# Patient Record
Sex: Male | Born: 1935
Health system: Southern US, Community
[De-identification: ages and names within clinical notes are randomized; demographics above are authoritative.]

## PROBLEM LIST (undated history)

## (undated) DIAGNOSIS — I10 Essential (primary) hypertension: Secondary | ICD-10-CM

## (undated) DIAGNOSIS — G4733 Obstructive sleep apnea (adult) (pediatric): Secondary | ICD-10-CM

## (undated) DIAGNOSIS — Z951 Presence of aortocoronary bypass graft: Secondary | ICD-10-CM

## (undated) DIAGNOSIS — Z952 Presence of prosthetic heart valve: Secondary | ICD-10-CM

## (undated) DIAGNOSIS — E119 Type 2 diabetes mellitus without complications: Secondary | ICD-10-CM

## (undated) DIAGNOSIS — I739 Peripheral vascular disease, unspecified: Secondary | ICD-10-CM

## (undated) DIAGNOSIS — R0600 Dyspnea, unspecified: Secondary | ICD-10-CM

## (undated) DIAGNOSIS — D696 Thrombocytopenia, unspecified: Secondary | ICD-10-CM

## (undated) DIAGNOSIS — T82858A Stenosis of vascular prosthetic devices, implants and grafts, initial encounter: Secondary | ICD-10-CM

## (undated) DIAGNOSIS — I00 Rheumatic fever without heart involvement: Secondary | ICD-10-CM

## (undated) DIAGNOSIS — I5032 Chronic diastolic (congestive) heart failure: Secondary | ICD-10-CM

## (undated) DIAGNOSIS — I482 Chronic atrial fibrillation, unspecified: Secondary | ICD-10-CM

## (undated) DIAGNOSIS — R040 Epistaxis: Secondary | ICD-10-CM

## (undated) DIAGNOSIS — E538 Deficiency of other specified B group vitamins: Secondary | ICD-10-CM

## (undated) DIAGNOSIS — R011 Cardiac murmur, unspecified: Secondary | ICD-10-CM

## (undated) HISTORY — PX: OTHER SURGICAL HISTORY: SHX169

## (undated) HISTORY — DX: Chronic atrial fibrillation, unspecified: I48.20

## (undated) HISTORY — DX: Essential (primary) hypertension: I10

## (undated) HISTORY — PX: TONSILLECTOMY: SUR1361

## (undated) HISTORY — DX: Presence of aortocoronary bypass graft: Z95.1

## (undated) HISTORY — DX: Rheumatic fever without heart involvement: I00

## (undated) HISTORY — DX: Peripheral vascular disease, unspecified: I73.9

## (undated) HISTORY — DX: Obstructive sleep apnea (adult) (pediatric): G47.33

## (undated) HISTORY — DX: Deficiency of other specified B group vitamins: E53.8

## (undated) HISTORY — DX: Stenosis of other vascular prosthetic devices, implants and grafts, initial encounter: T82.858A

---

## 1992-04-03 DIAGNOSIS — I779 Disorder of arteries and arterioles, unspecified: Secondary | ICD-10-CM

## 1992-04-03 HISTORY — DX: Disorder of arteries and arterioles, unspecified: I77.9

## 1992-04-03 HISTORY — PX: CARDIAC VALVE SURGERY: SHX40

## 1992-04-03 HISTORY — PX: CORONARY ARTERY BYPASS GRAFT: SHX141

## 1995-04-04 HISTORY — PX: CAROTID ENDARTERECTOMY: SUR193

## 2004-03-11 ENCOUNTER — Encounter: Admission: RE | Admit: 2004-03-11 | Discharge: 2004-03-11 | Payer: Self-pay | Admitting: Gastroenterology

## 2005-01-11 ENCOUNTER — Ambulatory Visit (HOSPITAL_COMMUNITY): Admission: RE | Admit: 2005-01-11 | Discharge: 2005-01-11 | Payer: Self-pay | Admitting: Interventional Cardiology

## 2006-04-20 ENCOUNTER — Encounter: Admission: RE | Admit: 2006-04-20 | Discharge: 2006-04-20 | Payer: Self-pay | Admitting: Family Medicine

## 2006-05-07 ENCOUNTER — Ambulatory Visit: Payer: Self-pay | Admitting: Vascular Surgery

## 2006-07-06 ENCOUNTER — Ambulatory Visit (HOSPITAL_COMMUNITY): Admission: RE | Admit: 2006-07-06 | Discharge: 2006-07-06 | Payer: Self-pay | Admitting: Interventional Cardiology

## 2007-11-29 ENCOUNTER — Encounter: Admission: RE | Admit: 2007-11-29 | Discharge: 2007-11-29 | Payer: Self-pay | Admitting: Interventional Cardiology

## 2008-12-30 ENCOUNTER — Inpatient Hospital Stay (HOSPITAL_COMMUNITY): Admission: EM | Admit: 2008-12-30 | Discharge: 2009-01-04 | Payer: Self-pay | Admitting: Emergency Medicine

## 2008-12-30 ENCOUNTER — Encounter (INDEPENDENT_AMBULATORY_CARE_PROVIDER_SITE_OTHER): Payer: Self-pay | Admitting: Internal Medicine

## 2008-12-30 DIAGNOSIS — T827XXA Infection and inflammatory reaction due to other cardiac and vascular devices, implants and grafts, initial encounter: Secondary | ICD-10-CM

## 2008-12-31 ENCOUNTER — Ambulatory Visit: Payer: Self-pay | Admitting: Infectious Diseases

## 2009-01-01 ENCOUNTER — Encounter (INDEPENDENT_AMBULATORY_CARE_PROVIDER_SITE_OTHER): Payer: Self-pay | Admitting: Internal Medicine

## 2009-01-04 ENCOUNTER — Ambulatory Visit: Payer: Self-pay | Admitting: Infectious Diseases

## 2009-01-12 ENCOUNTER — Telehealth: Payer: Self-pay | Admitting: Infectious Diseases

## 2009-01-12 ENCOUNTER — Encounter: Payer: Self-pay | Admitting: Infectious Diseases

## 2009-01-15 ENCOUNTER — Telehealth: Payer: Self-pay | Admitting: Infectious Diseases

## 2009-01-15 ENCOUNTER — Encounter: Payer: Self-pay | Admitting: Infectious Diseases

## 2009-01-18 ENCOUNTER — Telehealth: Payer: Self-pay | Admitting: Infectious Diseases

## 2009-01-18 ENCOUNTER — Encounter: Payer: Self-pay | Admitting: Infectious Diseases

## 2009-01-21 ENCOUNTER — Encounter: Payer: Self-pay | Admitting: Infectious Diseases

## 2009-01-22 ENCOUNTER — Telehealth: Payer: Self-pay | Admitting: Infectious Diseases

## 2009-01-26 ENCOUNTER — Encounter: Payer: Self-pay | Admitting: Infectious Disease

## 2009-01-26 ENCOUNTER — Telehealth: Payer: Self-pay | Admitting: Infectious Diseases

## 2009-01-26 ENCOUNTER — Encounter: Payer: Self-pay | Admitting: Infectious Diseases

## 2009-01-29 ENCOUNTER — Encounter: Payer: Self-pay | Admitting: Infectious Diseases

## 2009-01-29 ENCOUNTER — Telehealth: Payer: Self-pay | Admitting: Infectious Diseases

## 2009-02-01 ENCOUNTER — Encounter: Payer: Self-pay | Admitting: Infectious Diseases

## 2009-02-02 ENCOUNTER — Encounter: Payer: Self-pay | Admitting: Infectious Diseases

## 2009-02-03 ENCOUNTER — Encounter (INDEPENDENT_AMBULATORY_CARE_PROVIDER_SITE_OTHER): Payer: Self-pay | Admitting: *Deleted

## 2009-02-03 DIAGNOSIS — I10 Essential (primary) hypertension: Secondary | ICD-10-CM | POA: Insufficient documentation

## 2009-02-03 DIAGNOSIS — I5032 Chronic diastolic (congestive) heart failure: Secondary | ICD-10-CM | POA: Insufficient documentation

## 2009-02-03 DIAGNOSIS — I2510 Atherosclerotic heart disease of native coronary artery without angina pectoris: Secondary | ICD-10-CM | POA: Insufficient documentation

## 2009-02-03 DIAGNOSIS — Z9889 Other specified postprocedural states: Secondary | ICD-10-CM

## 2009-02-03 DIAGNOSIS — E785 Hyperlipidemia, unspecified: Secondary | ICD-10-CM | POA: Insufficient documentation

## 2009-02-03 DIAGNOSIS — I4891 Unspecified atrial fibrillation: Secondary | ICD-10-CM

## 2009-02-04 ENCOUNTER — Ambulatory Visit: Payer: Self-pay | Admitting: Infectious Diseases

## 2009-02-04 DIAGNOSIS — N179 Acute kidney failure, unspecified: Secondary | ICD-10-CM

## 2009-02-04 DIAGNOSIS — L270 Generalized skin eruption due to drugs and medicaments taken internally: Secondary | ICD-10-CM | POA: Insufficient documentation

## 2009-02-04 LAB — CONVERTED CEMR LAB
ALT: 18 units/L (ref 0–53)
AST: 20 units/L (ref 0–37)
Albumin: 3.2 g/dL — ABNORMAL LOW (ref 3.5–5.2)
Alkaline Phosphatase: 50 units/L (ref 39–117)
BUN: 25 mg/dL — ABNORMAL HIGH (ref 6–23)
Basophils Absolute: 0 10*3/uL (ref 0.0–0.1)
Basophils Relative: 0 % (ref 0–1)
Bilirubin Urine: NEGATIVE
CO2: 27 meq/L (ref 19–32)
Calcium: 8.5 mg/dL (ref 8.4–10.5)
Chloride: 98 meq/L (ref 96–112)
Creatinine, Ser: 2.97 mg/dL — ABNORMAL HIGH (ref 0.40–1.50)
Eosinophils Absolute: 0.6 10*3/uL (ref 0.0–0.7)
Eosinophils Relative: 8 % — ABNORMAL HIGH (ref 0–5)
Glucose, Bld: 171 mg/dL — ABNORMAL HIGH (ref 70–99)
HCT: 30.7 % — ABNORMAL LOW (ref 39.0–52.0)
Hemoglobin: 10.7 g/dL — ABNORMAL LOW (ref 13.0–17.0)
Ketones, ur: NEGATIVE mg/dL
Lymphocytes Relative: 12 % (ref 12–46)
Lymphs Abs: 0.9 10*3/uL (ref 0.7–4.0)
MCHC: 34.8 g/dL (ref 30.0–36.0)
MCV: 98.3 fL (ref >=78.0)
Monocytes Absolute: 0.4 10*3/uL (ref 0.1–1.0)
Monocytes Relative: 6 % (ref 3–12)
Neutro Abs: 5.7 10*3/uL (ref 1.7–7.7)
Neutrophils Relative %: 74 % (ref 43–77)
Nitrite: NEGATIVE
Platelets: 135 10*3/uL — ABNORMAL LOW (ref 150–400)
Potassium: 3.1 meq/L — ABNORMAL LOW (ref 3.5–5.3)
Protein, ur: 30 mg/dL — AB
RBC: 3.13 M/uL — ABNORMAL LOW (ref 4.22–5.81)
RDW: 15.6 % — ABNORMAL HIGH (ref 11.5–15.5)
Sed Rate: 115 mm/hr — ABNORMAL HIGH (ref 0–16)
Sodium: 135 meq/L (ref 135–145)
Specific Gravity, Urine: 1.017 (ref 1.005–1.0)
Total Bilirubin: 0.5 mg/dL (ref 0.3–1.2)
Total Protein: 7.3 g/dL (ref 6.0–8.3)
Urine Glucose: NEGATIVE mg/dL
Urobilinogen, UA: 0.2 (ref 0.0–1.0)
WBC: 7.7 10*3/uL (ref 4.0–10.5)
pH: 5.5 (ref 5.0–8.0)

## 2009-02-09 ENCOUNTER — Ambulatory Visit: Payer: Self-pay | Admitting: Infectious Diseases

## 2009-02-09 LAB — CONVERTED CEMR LAB
BUN: 38 mg/dL — ABNORMAL HIGH (ref 6–23)
Basophils Absolute: 0.1 10*3/uL (ref 0.0–0.1)
Basophils Relative: 1 % (ref 0–1)
CO2: 24 meq/L (ref 19–32)
Calcium: 8.5 mg/dL (ref 8.4–10.5)
Chloride: 103 meq/L (ref 96–112)
Creatinine, Ser: 2.12 mg/dL — ABNORMAL HIGH (ref 0.40–1.50)
Eosinophils Absolute: 0.6 10*3/uL (ref 0.0–0.7)
Eosinophils Relative: 7 % — ABNORMAL HIGH (ref 0–5)
Glucose, Bld: 135 mg/dL — ABNORMAL HIGH (ref 70–99)
HCT: 37 % — ABNORMAL LOW (ref 39.0–52.0)
Hemoglobin: 11.4 g/dL — ABNORMAL LOW (ref 13.0–17.0)
Lymphocytes Relative: 25 % (ref 12–46)
Lymphs Abs: 2.2 10*3/uL (ref 0.7–4.0)
MCHC: 30.8 g/dL (ref 30.0–36.0)
MCV: 102.8 fL — ABNORMAL HIGH (ref >=78.0)
Monocytes Absolute: 0.6 10*3/uL (ref 0.1–1.0)
Monocytes Relative: 7 % (ref 3–12)
Neutro Abs: 5.3 10*3/uL (ref 1.7–7.7)
Neutrophils Relative %: 61 % (ref 43–77)
Platelets: 186 10*3/uL (ref 150–400)
Potassium: 3.8 meq/L (ref 3.5–5.3)
RBC: 3.6 M/uL — ABNORMAL LOW (ref 4.22–5.81)
RDW: 15.7 % — ABNORMAL HIGH (ref 11.5–15.5)
Sed Rate: 85 mm/hr — ABNORMAL HIGH (ref 0–16)
Sodium: 142 meq/L (ref 135–145)
WBC: 8.8 10*3/uL (ref 4.0–10.5)

## 2009-02-17 ENCOUNTER — Telehealth: Payer: Self-pay

## 2009-02-18 ENCOUNTER — Ambulatory Visit: Payer: Self-pay | Admitting: Infectious Diseases

## 2009-02-18 LAB — CONVERTED CEMR LAB
ALT: 13 units/L (ref 0–53)
AST: 20 units/L (ref 0–37)
Albumin: 4 g/dL (ref 3.5–5.2)
Alkaline Phosphatase: 53 units/L (ref 39–117)
BUN: 21 mg/dL (ref 6–23)
Basophils Absolute: 0.1 10*3/uL (ref 0.0–0.1)
Basophils Relative: 1 % (ref 0–1)
CO2: 28 meq/L (ref 19–32)
CRP: 1.7 mg/dL — ABNORMAL HIGH (ref <0.6)
Calcium: 8.8 mg/dL (ref 8.4–10.5)
Chloride: 102 meq/L (ref 96–112)
Creatinine, Ser: 2.1 mg/dL — ABNORMAL HIGH (ref 0.40–1.50)
Eosinophils Absolute: 0.6 10*3/uL (ref 0.0–0.7)
Eosinophils Relative: 7 % — ABNORMAL HIGH (ref 0–5)
Glucose, Bld: 126 mg/dL — ABNORMAL HIGH (ref 70–99)
HCT: 35.2 % — ABNORMAL LOW (ref 39.0–52.0)
Hemoglobin: 11 g/dL — ABNORMAL LOW (ref 13.0–17.0)
Lymphocytes Relative: 27 % (ref 12–46)
Lymphs Abs: 2.1 10*3/uL (ref 0.7–4.0)
MCHC: 31.3 g/dL (ref 30.0–36.0)
MCV: 102 fL — ABNORMAL HIGH (ref >=78.0)
Monocytes Absolute: 0.7 10*3/uL (ref 0.1–1.0)
Monocytes Relative: 8 % (ref 3–12)
Neutro Abs: 4.4 10*3/uL (ref 1.7–7.7)
Neutrophils Relative %: 57 % (ref 43–77)
Platelets: 146 10*3/uL — ABNORMAL LOW (ref 150–400)
Potassium: 3.8 meq/L (ref 3.5–5.3)
RBC: 3.45 M/uL — ABNORMAL LOW (ref 4.22–5.81)
RDW: 15.7 % — ABNORMAL HIGH (ref 11.5–15.5)
Sed Rate: 71 mm/hr — ABNORMAL HIGH (ref 0–16)
Sodium: 140 meq/L (ref 135–145)
Total Bilirubin: 0.5 mg/dL (ref 0.3–1.2)
Total Protein: 7.5 g/dL (ref 6.0–8.3)
WBC: 7.7 10*3/uL (ref 4.0–10.5)

## 2009-03-11 ENCOUNTER — Ambulatory Visit: Payer: Self-pay | Admitting: Infectious Diseases

## 2009-03-11 LAB — CONVERTED CEMR LAB
ALT: 12 units/L (ref 0–53)
AST: 18 units/L (ref 0–37)
Albumin: 4 g/dL (ref 3.5–5.2)
Alkaline Phosphatase: 62 units/L (ref 39–117)
BUN: 20 mg/dL (ref 6–23)
Basophils Absolute: 0 10*3/uL (ref 0.0–0.1)
Basophils Relative: 1 % (ref 0–1)
CO2: 25 meq/L (ref 19–32)
CRP: 1.9 mg/dL — ABNORMAL HIGH (ref <0.6)
Calcium: 8.9 mg/dL (ref 8.4–10.5)
Chloride: 105 meq/L (ref 96–112)
Creatinine, Ser: 1.57 mg/dL — ABNORMAL HIGH (ref 0.40–1.50)
Eosinophils Absolute: 0.3 10*3/uL (ref 0.0–0.7)
Eosinophils Relative: 5 % (ref 0–5)
Glucose, Bld: 141 mg/dL — ABNORMAL HIGH (ref 70–99)
HCT: 32.9 % — ABNORMAL LOW (ref 39.0–52.0)
Hemoglobin: 10.6 g/dL — ABNORMAL LOW (ref 13.0–17.0)
Lymphocytes Relative: 29 % (ref 12–46)
Lymphs Abs: 1.4 10*3/uL (ref 0.7–4.0)
MCHC: 32.2 g/dL (ref 30.0–36.0)
MCV: 101.2 fL — ABNORMAL HIGH (ref >=78.0)
Monocytes Absolute: 0.3 10*3/uL (ref 0.1–1.0)
Monocytes Relative: 7 % (ref 3–12)
Neutro Abs: 2.8 10*3/uL (ref 1.7–7.7)
Neutrophils Relative %: 59 % (ref 43–77)
Platelets: 109 10*3/uL — ABNORMAL LOW (ref 150–400)
Potassium: 3.8 meq/L (ref 3.5–5.3)
RBC: 3.25 M/uL — ABNORMAL LOW (ref 4.22–5.81)
RDW: 16.5 % — ABNORMAL HIGH (ref 11.5–15.5)
Sed Rate: 43 mm/hr — ABNORMAL HIGH (ref 0–16)
Sodium: 144 meq/L (ref 135–145)
Total Bilirubin: 0.7 mg/dL (ref 0.3–1.2)
Total Protein: 6.8 g/dL (ref 6.0–8.3)
WBC: 4.8 10*3/uL (ref 4.0–10.5)

## 2009-04-13 ENCOUNTER — Encounter: Payer: Self-pay | Admitting: Infectious Diseases

## 2009-04-27 ENCOUNTER — Ambulatory Visit: Payer: Self-pay | Admitting: Infectious Diseases

## 2009-04-27 LAB — CONVERTED CEMR LAB
BUN: 22 mg/dL (ref 6–23)
CO2: 27 meq/L (ref 19–32)
Calcium: 9.1 mg/dL (ref 8.4–10.5)
Chloride: 103 meq/L (ref 96–112)
Creatinine, Ser: 1.23 mg/dL (ref 0.40–1.50)
Glucose, Bld: 125 mg/dL — ABNORMAL HIGH (ref 70–99)
Potassium: 4.2 meq/L (ref 3.5–5.3)
Sodium: 141 meq/L (ref 135–145)

## 2010-03-16 ENCOUNTER — Encounter
Admission: RE | Admit: 2010-03-16 | Discharge: 2010-03-16 | Payer: Self-pay | Source: Home / Self Care | Attending: Gastroenterology | Admitting: Gastroenterology

## 2010-04-06 ENCOUNTER — Encounter: Payer: Self-pay | Admitting: Infectious Diseases

## 2010-05-03 NOTE — Miscellaneous (Signed)
Summary: [facility]: Verbal Orders  [facility]: Verbal Orders   Imported By: Bonner Puna 04/20/2009 09:19:32  _____________________________________________________________________  External Attachment:    Type:   Image     Comment:   External Document

## 2010-05-03 NOTE — Assessment & Plan Note (Signed)
Summary: 6wk f/u/vs   Primary Provider:  Adrian Prows MD  CC:  6 week follow up.  History of Present Illness: 75 yo with history of prosthetic aortic valve admitted 9/28 with fevers and SOB.  There was concern for endocarditis.  He was intiially febrile, in Afib with RVR, had evidence of volume overload and was admitted to the CCU.  He had negative Bcx but had 3 minor criteria for endocarditis by [facility] (fever, predisposing condition and +RF).  TEE was neg for veg but it was elected to take a conservative approach and treat him for presumed prosthetic valve endocardits.  He was started on vanco/gent and rifampin and discharged home.  Has had a complicated course since then with elevated cr and then a rash. He had felt quite fatigued intiially and had diarrhea as well but that had  started to improve and started to get his energy and appetiti back.   Currently feels the best he has in a while.  No fevers, chills ns, wt loss.  Rash resolved.  No bad taste in mouth and sore resolved.  12/9/10I last saw 12/9//2010 when he still had  itching of his skin.  Gave another course of steroids and here for follow up.  Still with some itching and slight rash but not as severe.  Skin is very dry.    Finished his 6 day course. Doing well now.  Back to energy level and is active.   Urination well.  Some increased energy.  No fevers chills, NS.  Discharge summary  1. 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   2. Paroxysmal atrial fibrillation.   3. Hypertension.   4. Dyslipidemia.   5. On anticoagulation because of a St. Jude's aortic valve.   6. St. Jude's mechanical aortic valve replacement in 1994 for aortic       stenosis.   7. Coronary artery bypass grafting with aortic valve replacement in       1994   8. Chronic diastolic heart failure with an left ventricular ejection       fraction of 47%.   9. Left carotid endarterectomy.   10.History of subclavian steal.    Preventive  Screening-Counseling & Management  Alcohol-Tobacco     Alcohol drinks/day: occassionally     Alcohol type: wine     Smoking Status: never  Caffeine-Diet-Exercise     Caffeine use/day: coffee     Does Patient Exercise: yes     Type of exercise: walking  Safety-Violence-Falls     Seat Belt Use: yes   Updated Prior Medication List: ALBUTEROL SULFATE (2.5 MG/3ML) 0.083% NEBU (ALBUTEROL SULFATE) per protocol POTASSIUM CHLORIDE CR 10 MEQ CR-CAPS (POTASSIUM CHLORIDE) Take 1 capsule by mouth two times a day COREG 25 MG TABS (CARVEDILOL) Take 1 tablet by mouth two times a day WARFARIN SODIUM 5 MG TABS (WARFARIN SODIUM) Take 1 tablet by mouth at bedtime FUROSEMIDE 40 MG TABS (FUROSEMIDE) Take 1 tablet by mouth once a day * VYTORIN  TABS (EZETIMIBE-SIMVASTATIN) Take 1 tablet by mouth at bedtime per PCP  Current Allergies (reviewed today): ! VANCOMYCIN ! RIFADIN (RIFAMPIN) Past History:  Past Medical History: Last updated: 02/09/2009 1. 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   2. Paroxysmal atrial fibrillation.   3. Hypertension.   4. Dyslipidemia.   5. On anticoagulation because of a St. Jude's aortic valve.   6. St. Jude's mechanical aortic valve replacement in 1994 for aortic       stenosis.   7. Coronary artery  bypass grafting with aortic valve replacement in       1994 with is saphenous vein graft to right coronary artery and       saphenous vein graft to circumflex.   8. Chronic diastolic heart failure with an left ventricular ejection       fraction of 47%.   9. Left carotid endarterectomy.   10.History of subclavian steal.   Family History: Last updated: 02/09/2009   Social History: Last updated: 02/04/2009 lvies with wife - has several children, no tob. retired  Risk Factors: Alcohol Use: occassionally (04/27/2009) Caffeine Use: coffee (04/27/2009) Exercise: yes (04/27/2009)  Risk Factors: Smoking Status: never (04/27/2009)  Review of Systems        11 systems reviewed and negative except per HPI   Vital Signs:  Patient profile:   75 year old male Height:      73 inches (185.42 cm) Weight:      296.5 pounds (134.77 kg) BMI:     39.26 Temp:     97.1 degrees F (36.17 degrees C) oral Pulse rate:   65 / minute BP sitting:   138 / 81  (right arm)  Vitals Entered By: Rocky Morel) (April 27, 2009 9:15 AM) CC: 6 week follow up Is Patient Diabetic? No Pain Assessment Patient in pain? no      Nutritional Status BMI of > 30 = obese Nutritional Status Detail appetite is great per patient  Does patient need assistance? Functional Status Self care Ambulation Normal   Physical Exam  General:  alert, well-developed, and well-hydrated.   Eyes:  vision grossly intact and pupils equal.   Mouth:  fair dentition.   Neck:  supple.   Lungs:  normal respiratory effort, no accessory muscle use, and normal breath sounds.   Heart:  normal rate, regular rhythm, and no murmur.  avr click Abdomen:  soft and non-tender.   Extremities:  2+ edema RLE, 1+ LLE  Neurologic:  a+ox3 Skin:  no rashes.     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 Resolved Orders: Est. Patient Level IV [id number])  Problem # 2:  ACUTE KIDNEY FAILURE UNSPECIFIED (ICD-584.9) Will repeat BMEt.  All likely related to abx.  Could prob restart vasotec if Dr Tamala Julian feels it is necessary with close f/u of renal fxn Orders: Est. Patient Level IV (123XX123) T-Basic Metabolic Panel (99991111)  Problem # 3:  CUTANEOUS ERUPTIONS, DRUG-INDUCED (ICD-693.0) resolved.  Problem # 4:  CHRONIC DIASTOLIC HEART FAILURE (0000000) He will f/u with Dr Tamala Julian in cards for this and could prob restart vasotec now that Cr is improving. His updated medication list for this problem includes:    Coreg 25 Mg Tabs (Carvedilol) .Marland Kitchen... Take 1 tablet by mouth two times a day     Warfarin Sodium 5 Mg Tabs (Warfarin sodium) .Marland Kitchen... Take 1 tablet by mouth at bedtime    Furosemide 40 Mg Tabs (Furosemide) .Marland Kitchen... Take 1 tablet by mouth once a day  Patient Instructions: 1)  Follow up as needed for infection or new issues. Process Orders Check Orders Results:     [facility]: Check successful Tests Sent for requisitioning (April 27, 2009 9:39 AM):     04/27/2009: [facility] -- T-Basic Metabolic Panel 0000000 (signed)

## 2010-05-05 NOTE — Miscellaneous (Signed)
Summary: [facility]: Orders  [facility]: Orders   Imported By: Bonner Puna 04/22/2010 11:01:59  _____________________________________________________________________  External Attachment:    Type:   Image     Comment:   External Document

## 2010-05-19 ENCOUNTER — Encounter: Payer: Self-pay | Admitting: Infectious Diseases

## 2010-05-31 NOTE — Miscellaneous (Signed)
Summary: [facility]: Verbal Orders  [facility]: Verbal Orders   Imported By: Bonner Puna 05/25/2010 09:27:58  _____________________________________________________________________  External Attachment:    Type:   Image     Comment:   External Document

## 2010-07-07 LAB — COMPREHENSIVE METABOLIC PANEL
ALT: 78 U/L — ABNORMAL HIGH (ref 0–53)
AST: 70 U/L — ABNORMAL HIGH (ref 0–37)
Albumin: 3.1 g/dL — ABNORMAL LOW (ref 3.5–5.2)
Alkaline Phosphatase: 43 U/L (ref 39–117)
BUN: 27 mg/dL — ABNORMAL HIGH (ref 6–23)
CO2: 24 mEq/L (ref 19–32)
Calcium: 8.3 mg/dL — ABNORMAL LOW (ref 8.4–10.5)
Chloride: 100 mEq/L (ref 96–112)
Creatinine, Ser: 1.22 mg/dL (ref 0.4–1.5)
GFR calc Af Amer: >60 mL/min (ref >60)
GFR calc non Af Amer: 59 mL/min — ABNORMAL LOW (ref >60)
Glucose, Bld: 144 mg/dL — ABNORMAL HIGH (ref 70–99)
Potassium: 4 mEq/L (ref 3.5–5.1)
Sodium: 135 mEq/L (ref 135–145)
Total Bilirubin: 2.6 mg/dL — ABNORMAL HIGH (ref 0.3–1.2)
Total Protein: 6.5 g/dL (ref 6.0–8.3)

## 2010-07-07 LAB — BASIC METABOLIC PANEL
BUN: 17 mg/dL (ref 6–23)
BUN: 23 mg/dL (ref 6–23)
BUN: 24 mg/dL — ABNORMAL HIGH (ref 6–23)
CO2: 26 mEq/L (ref 19–32)
Calcium: 8.5 mg/dL (ref 8.4–10.5)
Calcium: 8.6 mg/dL (ref 8.4–10.5)
Chloride: 99 mEq/L (ref 96–112)
Creatinine, Ser: 1.06 mg/dL (ref 0.4–1.5)
Creatinine, Ser: 1.09 mg/dL (ref 0.4–1.5)
GFR calc Af Amer: >60 mL/min (ref >60)
GFR calc non Af Amer: >60 mL/min (ref >60)
GFR calc non Af Amer: >60 mL/min (ref >60)
Potassium: 3.3 mEq/L — ABNORMAL LOW (ref 3.5–5.1)
Potassium: 3.7 mEq/L (ref 3.5–5.1)

## 2010-07-07 LAB — PROTIME-INR
INR: 2.8 — ABNORMAL HIGH (ref 0.00–1.49)
INR: 2.8 — ABNORMAL HIGH (ref 0.00–1.49)
INR: 3 — ABNORMAL HIGH (ref 0.00–1.49)
Prothrombin Time: 28.9 seconds — ABNORMAL HIGH (ref 11.6–15.2)
Prothrombin Time: 29.5 seconds — ABNORMAL HIGH (ref 11.6–15.2)
Prothrombin Time: 30.7 seconds — ABNORMAL HIGH (ref 11.6–15.2)
Prothrombin Time: 31.7 seconds — ABNORMAL HIGH (ref 11.6–15.2)

## 2010-07-07 LAB — CBC
HCT: 30.6 % — ABNORMAL LOW (ref 39.0–52.0)
Hemoglobin: 10.4 g/dL — ABNORMAL LOW (ref 13.0–17.0)
MCHC: 34.2 g/dL (ref 30.0–36.0)
MCV: 102.4 fL — ABNORMAL HIGH (ref 78.0–100.0)
Platelets: 131 10*3/uL — ABNORMAL LOW (ref 150–400)
Platelets: DECREASED 10*3/uL (ref 150–400)
RBC: 2.98 MIL/uL — ABNORMAL LOW (ref 4.22–5.81)
RBC: 3.19 MIL/uL — ABNORMAL LOW (ref 4.22–5.81)
RDW: 16.5 % — ABNORMAL HIGH (ref 11.5–15.5)
WBC: 5.6 10*3/uL (ref 4.0–10.5)
WBC: 6.3 10*3/uL (ref 4.0–10.5)

## 2010-07-07 LAB — VANCOMYCIN, TROUGH: Vancomycin Tr: 32.6 ug/mL (ref 10.0–20.0)

## 2010-07-07 LAB — GLUCOSE, CAPILLARY
Glucose-Capillary: 113 mg/dL — ABNORMAL HIGH (ref 70–99)
Glucose-Capillary: 114 mg/dL — ABNORMAL HIGH (ref 70–99)
Glucose-Capillary: 132 mg/dL — ABNORMAL HIGH (ref 70–99)
Glucose-Capillary: 133 mg/dL — ABNORMAL HIGH (ref 70–99)
Glucose-Capillary: 143 mg/dL — ABNORMAL HIGH (ref 70–99)
Glucose-Capillary: 153 mg/dL — ABNORMAL HIGH (ref 70–99)
Glucose-Capillary: 166 mg/dL — ABNORMAL HIGH (ref 70–99)

## 2010-07-07 LAB — APTT
aPTT: 68 seconds — ABNORMAL HIGH (ref 24–37)
aPTT: 78 seconds — ABNORMAL HIGH (ref 24–37)

## 2010-07-07 LAB — LEGIONELLA ANTIGEN, URINE: Legionella Antigen, Urine: NEGATIVE

## 2010-07-07 LAB — BILIRUBIN, FRACTIONATED(TOT/DIR/INDIR)
Bilirubin, Direct: 0.9 mg/dL — ABNORMAL HIGH (ref 0.0–0.3)
Indirect Bilirubin: 1.5 mg/dL — ABNORMAL HIGH (ref 0.3–0.9)
Total Bilirubin: 1.5 mg/dL — ABNORMAL HIGH (ref 0.3–1.2)

## 2010-07-07 LAB — VIRUS CULTURE: Preliminary Culture: NEGATIVE

## 2010-07-07 LAB — MISCELLANEOUS TEST

## 2010-07-07 LAB — GENTAMICIN LEVEL, TROUGH: Gentamicin Trough: 1 ug/mL (ref 0.5–2.0)

## 2010-07-08 LAB — GLUCOSE, CAPILLARY
Glucose-Capillary: 135 mg/dL — ABNORMAL HIGH (ref 70–99)
Glucose-Capillary: 148 mg/dL — ABNORMAL HIGH (ref 70–99)
Glucose-Capillary: 181 mg/dL — ABNORMAL HIGH (ref 70–99)
Glucose-Capillary: 99 mg/dL (ref 70–99)

## 2010-07-08 LAB — URINALYSIS, ROUTINE W REFLEX MICROSCOPIC
Nitrite: NEGATIVE
Protein, ur: 100 mg/dL — AB
Specific Gravity, Urine: 1.03 (ref 1.005–1.030)
Urobilinogen, UA: 1 mg/dL (ref 0.0–1.0)

## 2010-07-08 LAB — PROTIME-INR
INR: 2.6 — ABNORMAL HIGH (ref 0.00–1.49)
Prothrombin Time: 21.8 seconds — ABNORMAL HIGH (ref 11.6–15.2)
Prothrombin Time: 27.6 seconds — ABNORMAL HIGH (ref 11.6–15.2)

## 2010-07-08 LAB — DIFFERENTIAL
Basophils Absolute: 0 10*3/uL (ref 0.0–0.1)
Basophils Absolute: 0 10*3/uL (ref 0.0–0.1)
Basophils Relative: 0 % (ref 0–1)
Basophils Relative: 0 % (ref 0–1)
Eosinophils Absolute: 0 10*3/uL (ref 0.0–0.7)
Eosinophils Relative: 0 % (ref 0–5)
Eosinophils Relative: 0 % (ref 0–5)
Lymphocytes Relative: 27 % (ref 12–46)
Monocytes Absolute: 0.4 10*3/uL (ref 0.1–1.0)
Monocytes Absolute: 0.5 10*3/uL (ref 0.1–1.0)
Monocytes Relative: 8 % (ref 3–12)
Neutro Abs: 3.7 10*3/uL (ref 1.7–7.7)

## 2010-07-08 LAB — BASIC METABOLIC PANEL
CO2: 24 mEq/L (ref 19–32)
Calcium: 9 mg/dL (ref 8.4–10.5)
Creatinine, Ser: 1.31 mg/dL (ref 0.4–1.5)
GFR calc non Af Amer: 54 mL/min — ABNORMAL LOW (ref >60)
Glucose, Bld: 213 mg/dL — ABNORMAL HIGH (ref 70–99)
Sodium: 135 mEq/L (ref 135–145)

## 2010-07-08 LAB — CULTURE, BLOOD (ROUTINE X 2)
Culture: NO GROWTH
Culture: NO GROWTH
Culture: NO GROWTH

## 2010-07-08 LAB — CBC
HCT: 32.7 % — ABNORMAL LOW (ref 39.0–52.0)
Hemoglobin: 11.1 g/dL — ABNORMAL LOW (ref 13.0–17.0)
Hemoglobin: 12.7 g/dL — ABNORMAL LOW (ref 13.0–17.0)
MCHC: 34 g/dL (ref 30.0–36.0)
MCHC: 34.3 g/dL (ref 30.0–36.0)
Platelets: 66 10*3/uL — ABNORMAL LOW (ref 150–400)
RBC: 3.27 MIL/uL — ABNORMAL LOW (ref 4.22–5.81)
RDW: 16.4 % — ABNORMAL HIGH (ref 11.5–15.5)
RDW: 16.6 % — ABNORMAL HIGH (ref 11.5–15.5)

## 2010-07-08 LAB — POCT I-STAT 3, ART BLOOD GAS (G3+)
Bicarbonate: 24.2 mEq/L — ABNORMAL HIGH (ref 20.0–24.0)
Patient temperature: 99
TCO2: 25 mmol/L (ref 0–100)
pO2, Arterial: 80 mmHg (ref 80.0–100.0)

## 2010-07-08 LAB — COMPREHENSIVE METABOLIC PANEL
ALT: 95 U/L — ABNORMAL HIGH (ref 0–53)
AST: 118 U/L — ABNORMAL HIGH (ref 0–37)
Albumin: 3.3 g/dL — ABNORMAL LOW (ref 3.5–5.2)
Alkaline Phosphatase: 48 U/L (ref 39–117)
Calcium: 8.5 mg/dL (ref 8.4–10.5)
GFR calc Af Amer: >60 mL/min (ref >60)
Glucose, Bld: 150 mg/dL — ABNORMAL HIGH (ref 70–99)
Potassium: 4 mEq/L (ref 3.5–5.1)
Sodium: 133 mEq/L — ABNORMAL LOW (ref 135–145)
Total Protein: 6.8 g/dL (ref 6.0–8.3)

## 2010-07-08 LAB — C-REACTIVE PROTEIN: CRP: 20.1 mg/dL — ABNORMAL HIGH (ref <0.6)

## 2010-07-08 LAB — BLOOD GAS, ARTERIAL
Bicarbonate: 24.3 mEq/L — ABNORMAL HIGH (ref 20.0–24.0)
TCO2: 25.8 mmol/L (ref 0–100)
pCO2 arterial: 48.5 mmHg — ABNORMAL HIGH (ref 35.0–45.0)
pH, Arterial: 7.321 — ABNORMAL LOW (ref 7.350–7.450)
pO2, Arterial: 296 mmHg — ABNORMAL HIGH (ref 80.0–100.0)

## 2010-07-08 LAB — HIV ANTIBODY (ROUTINE TESTING W REFLEX): HIV: NONREACTIVE

## 2010-07-08 LAB — POCT CARDIAC MARKERS
CKMB, poc: 1 ng/mL (ref 1.0–8.0)
Myoglobin, poc: 122 ng/mL (ref 12–200)
Troponin i, poc: <0.05 ng/mL (ref 0.00–0.09)

## 2010-07-08 LAB — CK TOTAL AND CKMB (NOT AT ARMC)
CK, MB: 1.3 ng/mL (ref 0.3–4.0)
CK, MB: 1.3 ng/mL (ref 0.3–4.0)
CK, MB: 1.6 ng/mL (ref 0.3–4.0)
Relative Index: 0.8 (ref 0.0–2.5)
Relative Index: 0.8 (ref 0.0–2.5)
Relative Index: 1.1 (ref 0.0–2.5)
Total CK: 191 U/L (ref 7–232)

## 2010-07-08 LAB — FOLATE: Folate: 9.9 ng/mL

## 2010-07-08 LAB — IRON AND TIBC
Saturation Ratios: 13 % — ABNORMAL LOW (ref 20–55)
TIBC: 277 ug/dL (ref 215–435)
UIBC: 241 ug/dL

## 2010-07-08 LAB — TROPONIN I
Troponin I: 0.04 ng/mL (ref 0.00–0.06)
Troponin I: 0.04 ng/mL (ref 0.00–0.06)

## 2010-07-08 LAB — HEPATITIS PANEL, ACUTE: HCV Ab: NEGATIVE

## 2010-07-08 LAB — FERRITIN: Ferritin: 2607 ng/mL — ABNORMAL HIGH (ref 22–322)

## 2010-07-08 LAB — SEDIMENTATION RATE: Sed Rate: 52 mm/hr — ABNORMAL HIGH (ref 0–16)

## 2010-07-08 LAB — URINE MICROSCOPIC-ADD ON

## 2010-07-08 LAB — ANA: Anti Nuclear Antibody(ANA): NEGATIVE

## 2010-07-08 LAB — RHEUMATOID FACTOR: Rhuematoid fact SerPl-aCnc: 21 IU/mL — ABNORMAL HIGH (ref 0–20)

## 2010-08-19 NOTE — Cardiovascular Report (Signed)
[name], [name] NO.:  [id number]   MEDICAL RECORD NO.:  [id number]          PATIENT TYPE:  OIB   LOCATION:  2899                         FACILITY:  [facility]   PHYSICIAN:  Belva Crome, M.D.   DATE OF BIRTH:  04/23/1935   DATE OF PROCEDURE:  01/11/2005  DATE OF DISCHARGE:                              CARDIAC CATHETERIZATION   INDICATIONS FOR PROCEDURE:  Recurrent atrial fibrillation.   PROCEDURE PERFORMED:  Biphasic elective electrical cardioversion.   DESCRIPTION:  After informed consent, the patient was given conscious  sedation by Dr. Lillia Abed. He received 300 mg of IV sodium Pentothal.  Airway was protected by the nurse anesthetist.  He was under continuous  cardiac and O2 monitoring.  After the patient was asleep and with a lead  configuration of anterior and posterior, a single discharge at 200 joules  was delivered with reversion initially to severe sinus bradycardia and  subsequently speeding up to sinus rhythm in the low 60s.  Only one shock was  administered.  The patient awakened with no sequelae.   CONCLUSION:  Successful elective electrical cardioversion from atrial  fibrillation to sinus rhythm.   PLAN:  Continue medications as before.      Belva Crome, M.D.  Electronically Signed     HWS/MEDQ  D:  01/11/2005  T:  01/11/2005  Job:  [id number]   cc:   Leilani Merl, M.D.  Fax: [phone]   Marcelino Duster, M.D.  Fax: [phone]

## 2010-08-19 NOTE — H&P (Signed)
NAMENATHAN, PINSON NO.:  [id number]   MEDICAL RECORD NO.:  [id number]          PATIENT TYPE:  OIB   LOCATION:  2899                         FACILITY:  [facility]   PHYSICIAN:  Belva Crome, M.D.   DATE OF BIRTH:  [birth year]   DATE OF ADMISSION:  07/06/2006  DATE OF DISCHARGE:                              HISTORY & PHYSICAL   PRIMARY CARE PHYSICIAN:  Animal nutritionist physicians at [facility].   CARDIOLOGIST:  Daneen Schick, MD   CHIEF COMPLAINT:  Paroxysmal atrial fibrillation.   HISTORY OF PRESENT ILLNESS:  Mr. Deon Pilling is a 75 year old obese male with  a history of paroxysmal atrial fibrillation, systemic anticoagulation  with a therapeutic INR, coronary artery disease, hypertension,  dyslipidemia, and status post mechanical aortic valve replacement.  The  patient was previously treated with propafenone which was later  discontinued.  He was then started on treatment with amiodarone and  Coreg; however, experienced fatigue secondary to the combination of  those to medications.  The medications were adjusted by Dr. Tamala Julian.  EKG:  The patient continued to remain in atrial fibrillation with slow  ventricular rate.  On today, he is being admitted as an outpatient for  direct current cardioversion to be performed by Dr. Daneen Schick.  The  patient denies chest pain, shortness of breath, dizziness, tachy  palpitations, near syncope, or syncope.   PAST MEDICAL HISTORY:  1. Paroxysmal atrial fibrillation.  2. Hypertension.  3. Dyslipidemia.  4. Systemic anticoagulation with Coumadin therapy.  5. Fatigue secondary to amiodarone and Coreg therapy.  6. Status post mechanical aortic valve replacement.  7. Coronary artery disease.  8. Obesity.   ALLERGIES:  NO KNOWN DRUG ALLERGIES.   MEDICATIONS:  1. Amiodarone 200 mg daily.  2. Vytorin 10/20.  3. Coreg 25 mg twice daily.  4. Vasotec 20 mg twice daily.  5. Coumadin 5 mg as directed.   FAMILY HISTORY:  Insignificant for  early coronary artery disease.   SOCIAL HISTORY:  Married with three children.  Lives with wife.  Former  smoker for approximately 30 years with cessation 14 years ago.  He  currently denies tobacco or illicit drug use; however, admits to  occasional alcohol use.   REVIEW OF SYSTEMS:  All other systems reviewed are negative, other than  what is stated in the HPI.   PHYSICAL EXAM:  GENERAL:  A 76 year old obese male, pleasant and  cooperative, NAD.  VITALS:  Temperature 97.2, blood pressure 108/68, pulse 60, O2  saturations 98% over room air.  Weight 131 kg, height 6 feet 2 inches.  HEENT:  Unremarkable.  NECK:  Supple without JVD or bilateral carotid bruits.  PULMONARY:  Breath sounds are equal and clear to auscultation  bilaterally.  No use of accessory muscles.  CV:  Irregularly irregular.  Normal S1-S2 with a positive valve click.  No murmurs noted.  ABDOMEN:  Protuberant, nontender with active bowel sounds.  EXTREMITIES:  Trace edema bilaterally.  No clubbing or cyanosis.  DP  pulses 2+/2, bilaterally.  SKIN:  Warm and dry without rashes or lesions.  NEURO:  No  focal motor or sensory deficits.  PSYCH:  Normal mood and affect.   LABORATORY DATA:  June 30, 2006:  White blood count 5.1, hemoglobin  14.8, hematocrit 42.8, platelets 128,000, sodium 141, potassium 5,  chloride 103, CO2 31, BUN 22, creatinine 1.1, glucose 118.  EKG July 06, 2006 revealed atrial fibrillation with at the SVR, with a ventricular  rate of 56 beats per minute.  There was no evidence of ST-segment/T-wave  changes.   ASSESSMENT:  1. Paroxysmal atrial fibrillation.  2. Systemic anticoagulation with a therapeutic INR of 2.8 as of July 04, 2006.  3. Hypertension.  4. Dyslipidemia.  5. Obesity.  6. Thrombocytopenia.  7. Coronary artery disease.  8. Fatigue with Amiodarone and Coumadin therapy.  9. Status post mechanical valve replacement.   PLAN:  1. Direct current cardioversion to be  performed by Dr. Daneen Schick on      July 06, 2006 at 9 o'clock a.m.Marland Kitchen  The DCCV procedure, risks, and      potential complications were explained to the patient in detail,      including anesthesia, brady arrhythmias that would require external      pacing, or other arrhythmias that would require a      repeat DCCV.  The patient admits to full understanding of the      information and wishes to proceed.  2. The patient was seen, interviewed, and examined by Dr. Daneen Schick      who participated in the medical decision making and plan of care.      Raiford Simmonds, [place]      Belva Crome, M.D.  Electronically Signed    RDM/MEDQ  D:  07/06/2006  T:  07/06/2006  Job:  [id number]   cc:   [place]

## 2010-08-19 NOTE — Op Note (Signed)
NAMEKHRIS, FRISCHMAN NO.:  [id number]   MEDICAL RECORD NO.:  [id number]          PATIENT TYPE:  OIB   LOCATION:  2899                         FACILITY:  [facility]   PHYSICIAN:  Fransico Him, M.D.     DATE OF BIRTH:  1935-08-24   DATE OF PROCEDURE:  07/06/2006  DATE OF DISCHARGE:                               OPERATIVE REPORT   REFERRING PHYSICIAN:  Development worker, [facility] at [facility], [place].   PROCEDURE:  Direct current cardioversion.   OPERATOR:  Fransico Him, MD.   INDICATIONS:  Atrial fibrillation status post amiodarone load.   COMPLICATIONS:  None.   IV MEDICATIONS:  Pentothal 175 mg IV.   This is a 75 year old male with a history of coronary disease and aortic  valve replacement who has atrial fibrillation and is status post  amiodarone loading, now presents for cardioversion.  The patient is  brought to the [facility] in the fasting nonsedated state.  Informed  consent was obtained.  The patient was connected to continuous heart  rate and pulse oximetry monitoring and intermittent blood pressure  monitoring.  After adequate anesthesia was obtained, a 100 joules  biphasic synchronized shock was delivered which successfully converted  the patient to sinus bradycardia.  The patient tolerated the procedure  well without complications.  Subsequently was discharged to home.   ASSESSMENT:  1. Atrial fibrillation status post amiodarone loading.  2. Systemic anticoagulation with therapeutic INR.  3. Successful cardioversion to sinus bradycardia.   PLAN:  Discharge to home after fully awake.  Follow-up EKG in Dr.  Thompson Caul office in 1 week.  Follow-up with Dr. Tamala Julian in 3 weeks.      Fransico Him, M.D.  Electronically Signed     TT/MEDQ  D:  07/06/2006  T:  07/06/2006  Job:  [id number]   cc:   [facility]

## 2011-04-07 DIAGNOSIS — R809 Proteinuria, unspecified: Secondary | ICD-10-CM | POA: Diagnosis not present

## 2011-04-07 DIAGNOSIS — E119 Type 2 diabetes mellitus without complications: Secondary | ICD-10-CM | POA: Diagnosis not present

## 2011-04-24 DIAGNOSIS — G4733 Obstructive sleep apnea (adult) (pediatric): Secondary | ICD-10-CM | POA: Diagnosis not present

## 2011-04-24 DIAGNOSIS — E669 Obesity, unspecified: Secondary | ICD-10-CM | POA: Diagnosis not present

## 2011-04-24 DIAGNOSIS — I10 Essential (primary) hypertension: Secondary | ICD-10-CM | POA: Diagnosis not present

## 2011-05-11 DIAGNOSIS — Z7901 Long term (current) use of anticoagulants: Secondary | ICD-10-CM | POA: Diagnosis not present

## 2011-05-11 DIAGNOSIS — Z954 Presence of other heart-valve replacement: Secondary | ICD-10-CM | POA: Diagnosis not present

## 2011-06-23 DIAGNOSIS — Z954 Presence of other heart-valve replacement: Secondary | ICD-10-CM | POA: Diagnosis not present

## 2011-06-23 DIAGNOSIS — Z7901 Long term (current) use of anticoagulants: Secondary | ICD-10-CM | POA: Diagnosis not present

## 2011-08-04 DIAGNOSIS — Z954 Presence of other heart-valve replacement: Secondary | ICD-10-CM | POA: Diagnosis not present

## 2011-08-04 DIAGNOSIS — Z7901 Long term (current) use of anticoagulants: Secondary | ICD-10-CM | POA: Diagnosis not present

## 2011-09-15 DIAGNOSIS — Z7901 Long term (current) use of anticoagulants: Secondary | ICD-10-CM | POA: Diagnosis not present

## 2011-09-15 DIAGNOSIS — Z954 Presence of other heart-valve replacement: Secondary | ICD-10-CM | POA: Diagnosis not present

## 2011-09-19 DIAGNOSIS — H4011X0 Primary open-angle glaucoma, stage unspecified: Secondary | ICD-10-CM | POA: Diagnosis not present

## 2011-09-19 DIAGNOSIS — H2510 Age-related nuclear cataract, unspecified eye: Secondary | ICD-10-CM | POA: Diagnosis not present

## 2011-10-11 DIAGNOSIS — E119 Type 2 diabetes mellitus without complications: Secondary | ICD-10-CM | POA: Diagnosis not present

## 2011-10-13 DIAGNOSIS — I10 Essential (primary) hypertension: Secondary | ICD-10-CM | POA: Diagnosis not present

## 2011-10-13 DIAGNOSIS — E119 Type 2 diabetes mellitus without complications: Secondary | ICD-10-CM | POA: Diagnosis not present

## 2011-10-13 DIAGNOSIS — R809 Proteinuria, unspecified: Secondary | ICD-10-CM | POA: Diagnosis not present

## 2011-11-03 DIAGNOSIS — G4733 Obstructive sleep apnea (adult) (pediatric): Secondary | ICD-10-CM | POA: Diagnosis not present

## 2011-11-03 DIAGNOSIS — Z7901 Long term (current) use of anticoagulants: Secondary | ICD-10-CM | POA: Diagnosis not present

## 2011-11-03 DIAGNOSIS — E669 Obesity, unspecified: Secondary | ICD-10-CM | POA: Diagnosis not present

## 2011-11-03 DIAGNOSIS — I4891 Unspecified atrial fibrillation: Secondary | ICD-10-CM | POA: Diagnosis not present

## 2011-11-03 DIAGNOSIS — I10 Essential (primary) hypertension: Secondary | ICD-10-CM | POA: Diagnosis not present

## 2011-12-06 DIAGNOSIS — Z954 Presence of other heart-valve replacement: Secondary | ICD-10-CM | POA: Diagnosis not present

## 2011-12-06 DIAGNOSIS — I5030 Unspecified diastolic (congestive) heart failure: Secondary | ICD-10-CM | POA: Diagnosis not present

## 2011-12-06 DIAGNOSIS — E785 Hyperlipidemia, unspecified: Secondary | ICD-10-CM | POA: Diagnosis not present

## 2011-12-06 DIAGNOSIS — Z7901 Long term (current) use of anticoagulants: Secondary | ICD-10-CM | POA: Diagnosis not present

## 2011-12-06 DIAGNOSIS — I4891 Unspecified atrial fibrillation: Secondary | ICD-10-CM | POA: Diagnosis not present

## 2011-12-27 DIAGNOSIS — H4011X0 Primary open-angle glaucoma, stage unspecified: Secondary | ICD-10-CM | POA: Diagnosis not present

## 2011-12-27 DIAGNOSIS — H2510 Age-related nuclear cataract, unspecified eye: Secondary | ICD-10-CM | POA: Diagnosis not present

## 2011-12-28 DIAGNOSIS — R059 Cough, unspecified: Secondary | ICD-10-CM | POA: Diagnosis not present

## 2011-12-28 DIAGNOSIS — R05 Cough: Secondary | ICD-10-CM | POA: Diagnosis not present

## 2011-12-28 DIAGNOSIS — J069 Acute upper respiratory infection, unspecified: Secondary | ICD-10-CM | POA: Diagnosis not present

## 2012-01-02 ENCOUNTER — Other Ambulatory Visit: Payer: Self-pay | Admitting: Family Medicine

## 2012-01-02 ENCOUNTER — Ambulatory Visit
Admission: RE | Admit: 2012-01-02 | Discharge: 2012-01-02 | Disposition: A | Discharge status: Home / Self Care | Payer: Federal, State, Local not specified - PPO | Source: Ambulatory Visit | Attending: Family Medicine | Admitting: Family Medicine

## 2012-01-02 DIAGNOSIS — Z7901 Long term (current) use of anticoagulants: Secondary | ICD-10-CM | POA: Diagnosis not present

## 2012-01-02 DIAGNOSIS — R05 Cough: Secondary | ICD-10-CM

## 2012-01-02 DIAGNOSIS — J811 Chronic pulmonary edema: Secondary | ICD-10-CM | POA: Diagnosis not present

## 2012-01-02 DIAGNOSIS — Z954 Presence of other heart-valve replacement: Secondary | ICD-10-CM | POA: Diagnosis not present

## 2012-01-17 ENCOUNTER — Encounter: Payer: Self-pay | Admitting: Pulmonary Disease

## 2012-01-18 ENCOUNTER — Encounter: Payer: Self-pay | Admitting: Pulmonary Disease

## 2012-01-18 ENCOUNTER — Ambulatory Visit (INDEPENDENT_AMBULATORY_CARE_PROVIDER_SITE_OTHER): Payer: Medicare Other | Admitting: Pulmonary Disease

## 2012-01-18 VITALS — BP 120/82 | HR 65 | Ht 73.5 in | Wt 285.0 lb

## 2012-01-18 DIAGNOSIS — J45901 Unspecified asthma with (acute) exacerbation: Secondary | ICD-10-CM

## 2012-01-18 DIAGNOSIS — R05 Cough: Secondary | ICD-10-CM | POA: Diagnosis not present

## 2012-01-18 DIAGNOSIS — J45909 Unspecified asthma, uncomplicated: Secondary | ICD-10-CM | POA: Insufficient documentation

## 2012-01-18 NOTE — Assessment & Plan Note (Signed)
The patient has no significant airflow obstruction on spirometry today, and therefore does not have underlying COPD.  I suspect he had acute asthmatic bronchitis, and responded appropriately to antibiotics and prednisone.  The patient feels that he is nearly back to his usual baseline, with only a mild residual cough.  If this does not totally resolve, and continues to be primarily dry, I would consider a trial of discontinuing his ACE inhibitor.  The patient does not require further pulmonary followup, but I am happy to see him again if other issues arise.

## 2012-01-18 NOTE — Progress Notes (Signed)
  Subjective:    Patient ID: [name], male    DOB: [birth year], 76 y.o.   MRN: [id number]  HPI The patient is a 76 year old male who I've been asked to see for recent pulmonary issues.  The patient was in his usual state of health until October of this year when he began to develop chest congestion with cough but no significant shortness of breath.  He was treated with prednisone and a course of antibiotics, and had significant improvement.  He feels that he is almost back to baseline, but still has a very mild cough about twice a day with scant nonpurulent mucus.  He tells me that he did not have any worsening shortness of breath during this time, but has chronic dyspnea with heavier exertional activities.  The patient has a history of significant cardiac disease, with chronic atrial fibrillation and also an ejection fraction of 45%.  He is status post aVR.  It should also be noted that he is on an ACE inhibitor.  He has had a recent chest x-ray that showed cardiomegaly, as well as prominent interstitial markings in the bases which is felt to possibly be edema.  The patient has a history of smoking three quarters of a pack a day for 30 years, but quit in 1994.  He has never had spirometry or pulmonary function studies.   Review of Systems  Constitutional: Negative for fever and unexpected weight change.  HENT: Negative for ear pain, nosebleeds, congestion, sore throat, rhinorrhea, sneezing, trouble swallowing, dental problem, postnasal drip and sinus pressure.   Eyes: Negative for redness and itching.  Respiratory: Positive for cough ( am only ) and shortness of breath (upon activity ). Negative for chest tightness and wheezing.   Cardiovascular: Negative for palpitations and leg swelling.  Gastrointestinal: Negative for nausea and vomiting.  Genitourinary: Negative for dysuria.  Musculoskeletal: Negative for joint swelling.  Skin: Negative for rash.  Neurological: Negative for headaches.    Hematological: Bruises/bleeds easily.  Psychiatric/Behavioral: Negative for dysphoric mood. The patient is not nervous/anxious.        Objective:   Physical Exam Constitutional:  Obese male, no acute distress  HENT:  Nares patent without discharge  Oropharynx without exudate, palate and uvula are elongated.   Eyes:  Perrla, eomi, no scleral icterus  Neck:  No JVD, no TMG  Cardiovascular:  Normal rate, irregular rhythm, no rubs or gallops.  No murmurs        Intact distal pulses but decreased.  +valve click  Pulmonary :  Normal breath sounds, no stridor or respiratory distress   No rales, rhonchi, or wheezing  Abdominal:  Soft, nondistended, bowel sounds present.  No tenderness noted.   Musculoskeletal:  1+ lower extremity edema noted, right greater than left  Lymph Nodes:  No cervical lymphadenopathy noted  Skin:  No cyanosis noted  Neurologic:  Alert, appropriate, moves all 4 extremities without obvious deficit.         Assessment & Plan:

## 2012-01-18 NOTE — Patient Instructions (Addendum)
You do not have copd by your breathing studies.  I suspect you had asthmatic bronchitis, and responded to the antibiotics and prednisone If your cough persists over time, would suggest discontinuing your enalapril to see if gets better.  Would discuss this with your primary md.  No followup with me needed at this time .

## 2012-01-30 DIAGNOSIS — Z954 Presence of other heart-valve replacement: Secondary | ICD-10-CM | POA: Diagnosis not present

## 2012-01-30 DIAGNOSIS — Z7901 Long term (current) use of anticoagulants: Secondary | ICD-10-CM | POA: Diagnosis not present

## 2012-03-08 DIAGNOSIS — Z23 Encounter for immunization: Secondary | ICD-10-CM | POA: Diagnosis not present

## 2012-03-12 DIAGNOSIS — Z7901 Long term (current) use of anticoagulants: Secondary | ICD-10-CM | POA: Diagnosis not present

## 2012-03-12 DIAGNOSIS — Z954 Presence of other heart-valve replacement: Secondary | ICD-10-CM | POA: Diagnosis not present

## 2012-04-17 DIAGNOSIS — E119 Type 2 diabetes mellitus without complications: Secondary | ICD-10-CM | POA: Diagnosis not present

## 2012-04-17 DIAGNOSIS — R82998 Other abnormal findings in urine: Secondary | ICD-10-CM | POA: Diagnosis not present

## 2012-04-19 DIAGNOSIS — E119 Type 2 diabetes mellitus without complications: Secondary | ICD-10-CM | POA: Diagnosis not present

## 2012-04-19 DIAGNOSIS — E785 Hyperlipidemia, unspecified: Secondary | ICD-10-CM | POA: Diagnosis not present

## 2012-04-23 DIAGNOSIS — Z954 Presence of other heart-valve replacement: Secondary | ICD-10-CM | POA: Diagnosis not present

## 2012-04-23 DIAGNOSIS — Z7901 Long term (current) use of anticoagulants: Secondary | ICD-10-CM | POA: Diagnosis not present

## 2012-05-10 DIAGNOSIS — I10 Essential (primary) hypertension: Secondary | ICD-10-CM | POA: Diagnosis not present

## 2012-05-10 DIAGNOSIS — G4733 Obstructive sleep apnea (adult) (pediatric): Secondary | ICD-10-CM | POA: Diagnosis not present

## 2012-05-10 DIAGNOSIS — E669 Obesity, unspecified: Secondary | ICD-10-CM | POA: Diagnosis not present

## 2012-05-21 DIAGNOSIS — Z954 Presence of other heart-valve replacement: Secondary | ICD-10-CM | POA: Diagnosis not present

## 2012-05-21 DIAGNOSIS — Z7901 Long term (current) use of anticoagulants: Secondary | ICD-10-CM | POA: Diagnosis not present

## 2012-07-11 DIAGNOSIS — Z7901 Long term (current) use of anticoagulants: Secondary | ICD-10-CM | POA: Diagnosis not present

## 2012-07-11 DIAGNOSIS — Z954 Presence of other heart-valve replacement: Secondary | ICD-10-CM | POA: Diagnosis not present

## 2012-07-31 DIAGNOSIS — Z954 Presence of other heart-valve replacement: Secondary | ICD-10-CM | POA: Diagnosis not present

## 2012-07-31 DIAGNOSIS — Z7901 Long term (current) use of anticoagulants: Secondary | ICD-10-CM | POA: Diagnosis not present

## 2012-09-11 DIAGNOSIS — Z954 Presence of other heart-valve replacement: Secondary | ICD-10-CM | POA: Diagnosis not present

## 2012-09-11 DIAGNOSIS — Z7901 Long term (current) use of anticoagulants: Secondary | ICD-10-CM | POA: Diagnosis not present

## 2012-10-10 DIAGNOSIS — I4891 Unspecified atrial fibrillation: Secondary | ICD-10-CM | POA: Diagnosis not present

## 2012-10-10 DIAGNOSIS — Z954 Presence of other heart-valve replacement: Secondary | ICD-10-CM | POA: Diagnosis not present

## 2012-10-10 DIAGNOSIS — Z7901 Long term (current) use of anticoagulants: Secondary | ICD-10-CM | POA: Diagnosis not present

## 2012-10-14 ENCOUNTER — Other Ambulatory Visit: Payer: Self-pay | Admitting: Endocrinology

## 2012-10-15 ENCOUNTER — Other Ambulatory Visit: Payer: Self-pay | Admitting: *Deleted

## 2012-10-15 ENCOUNTER — Other Ambulatory Visit (INDEPENDENT_AMBULATORY_CARE_PROVIDER_SITE_OTHER): Payer: Medicare Other

## 2012-10-15 DIAGNOSIS — E119 Type 2 diabetes mellitus without complications: Secondary | ICD-10-CM

## 2012-10-15 LAB — COMPREHENSIVE METABOLIC PANEL
ALT: 17 U/L (ref 0–53)
AST: 22 U/L (ref 0–37)
Albumin: 4.2 g/dL (ref 3.5–5.2)
BUN: 27 mg/dL — ABNORMAL HIGH (ref 6–23)
CO2: 28 mEq/L (ref 19–32)
Calcium: 9 mg/dL (ref 8.4–10.5)
Chloride: 103 mEq/L (ref 96–112)
GFR: 65.8 mL/min (ref >60.00)
Potassium: 4.4 mEq/L (ref 3.5–5.1)

## 2012-10-15 LAB — MICROALBUMIN / CREATININE URINE RATIO: Creatinine,U: 109.7 mg/dL

## 2012-10-17 ENCOUNTER — Encounter: Payer: Self-pay | Admitting: Endocrinology

## 2012-10-17 ENCOUNTER — Ambulatory Visit (INDEPENDENT_AMBULATORY_CARE_PROVIDER_SITE_OTHER): Payer: Medicare Other | Admitting: Endocrinology

## 2012-10-17 VITALS — BP 110/60 | HR 78 | Temp 98.6°F | Resp 12 | Ht 73.5 in | Wt 288.0 lb

## 2012-10-17 DIAGNOSIS — E118 Type 2 diabetes mellitus with unspecified complications: Secondary | ICD-10-CM | POA: Insufficient documentation

## 2012-10-17 DIAGNOSIS — I10 Essential (primary) hypertension: Secondary | ICD-10-CM

## 2012-10-17 DIAGNOSIS — E119 Type 2 diabetes mellitus without complications: Secondary | ICD-10-CM

## 2012-10-17 NOTE — Progress Notes (Signed)
Patient ID: [name], male   DOB: 05-10-1935, 77 y.o.   MRN: [id number]  Reason for Appointment: Diabetes follow-up   History of Present Illness   Diagnosis: Type 2 DIABETES MELITUS       Oral hypoglycemic drugs: metformin only       Side effects from medications: None   Monitors blood glucose: less than once a day         Blood Glucose readings: Glucometer download reviewed: readings before breakfast: previously upto 200, recent 137, 142, after supper 117-139 Hypoglycemia frequency: Never.          Meals: 3 meals per day. breakfast:occasional cereal otherwise muffin and peanut butter Physical activity: exercise: none a little walking           Dietician visit: Most [id number]          Complications: are: none The last HbgA1c was reported as 6.1   HYPERTENSION:  he is on multiple drugs with good control and normal renal function  HYPERLIPIDEMIA:         The lipid abnormality consists of elevated LDL treated with Vytorin.    Appointment on 10/15/2012  Component Date Value Range Status  . Hemoglobin A1C 10/15/2012 6.1  4.6 - 6.5 % Final   Glycemic Control Guidelines for People with Diabetes:Non Diabetic:  <6%Goal of Therapy: <7%Additional Action Suggested:  >8%   . Sodium 10/15/2012 138  135 - 145 mEq/L Final  . Potassium 10/15/2012 4.4  3.5 - 5.1 mEq/L Final  . Chloride 10/15/2012 103  96 - 112 mEq/L Final  . CO2 10/15/2012 28  19 - 32 mEq/L Final  . Glucose, Bld 10/15/2012 103* 70 - 99 mg/dL Final  . BUN 10/15/2012 27* 6 - 23 mg/dL Final  . Creatinine, Ser 10/15/2012 1.2  0.4 - 1.5 mg/dL Final  . Total Bilirubin 10/15/2012 1.1  0.3 - 1.2 mg/dL Final  . Alkaline Phosphatase 10/15/2012 58  39 - 117 U/L Final  . AST 10/15/2012 22  0 - 37 U/L Final  . ALT 10/15/2012 17  0 - 53 U/L Final  . Total Protein 10/15/2012 7.8  6.0 - 8.3 g/dL Final  . Albumin 10/15/2012 4.2  3.5 - 5.2 g/dL Final  . Calcium 10/15/2012 9.0  8.4 - 10.5 mg/dL Final  . GFR 10/15/2012 65.80  >60.00 mL/min  Final  . Microalb, Ur 10/15/2012 6.1* 0.0 - 1.9 mg/dL Final  . Creatinine,U 10/15/2012 109.7   Final  . Microalb Creat Ratio 10/15/2012 5.6  0.0 - 30.0 mg/g Final      Medication List       This list is accurate as of: 10/17/12 10:27 AM.  Always use your most recent med list.               albuterol (2.5 MG/3ML) 0.083% nebulizer solution  Commonly known as:  PROVENTIL  Take 2.5 mg by nebulization every 6 (six) hours as needed.     carvedilol 25 MG tablet  Commonly known as:  COREG  Take 25 mg by mouth 2 (two) times daily with a meal.     enalapril 20 MG tablet  Commonly known as:  VASOTEC  Take 20 mg by mouth daily.     ezetimibe-simvastatin 10-20 MG per tablet  Commonly known as:  VYTORIN  Take 1 tablet by mouth at bedtime.     fluocinonide cream 0.05 %  Commonly known as:  LIDEX  Apply topically 2 (two) times daily.     furosemide  40 MG tablet  Commonly known as:  LASIX  Take 40 mg by mouth daily.     GLUCOPHAGE XR 750 MG 24 hr tablet  Generic drug:  metFORMIN  Take 750 mg by mouth daily with breakfast.     KLOR-CON M10 10 MEQ tablet  Generic drug:  potassium chloride     NON FORMULARY  BIPAP     NON FORMULARY  ONE TOUCH ULTRA TEST STRIPS  AND LANCETS     Travoprost (BAK Free) 0.004 % Soln ophthalmic solution  Commonly known as:  TRAVATAN  Place 1 drop into both eyes at bedtime.     vitamin B-12 1000 MCG tablet  Commonly known as:  CYANOCOBALAMIN  Take 1,000 mcg by mouth daily.     warfarin 5 MG tablet  Commonly known as:  COUMADIN  Take 5 mg by mouth daily.        Allergies:  Allergies  Allergen Reactions  . Rifampin     REACTION: rash (unclear if due to vanco or rifampin)  . Vancomycin     REACTION: rash (unclear if due to rifampin or vanco)    Past Medical History  Diagnosis Date  . Hx of CABG   . Aortic stenosis   . Chronic atrial fibrillation   . CHF with unknown LVEF     47 %  . Subclavian bypass stenosis   . Rheumatic fever    . Vitamin B12 deficiency   . OSA (obstructive sleep apnea)     Past Surgical History  Procedure Laterality Date  . Heart bypass    . Cardiac valve surgery      Family History  Problem Relation Age of Onset  . Cancer Father     lung     Social History:  reports that he quit smoking about 20 years ago. His smoking use included Cigarettes. He has a 30 pack-year smoking history. He has never used smokeless tobacco. He reports that  drinks alcohol. He reports that he does not use illicit drugs.  Review of Systems - Cardiovascular ROS: positive for - hypertension and coronary artery disease   Examination:   BP 110/60  Pulse 78  Temp(Src) 98.6 F (37 C)  Resp 12  Ht 6' 1.5" (1.867 m)  Wt 288 lb (130.636 kg)  BMI 37.48 kg/m2  SpO2 96%  Body mass index is 37.48 kg/(m^2).   No pedal edema  Assesment/PLAN::   1. Diabetes type 2, uncontrolled - 250.02  The patient's diabetes control appears to be overall well controlled although he thinks he occasionally has high readings after unbalanced meals at breakfast with more carbohydrate. He has benefited from doing a little glucose monitoring since his last visit and is able to modify his diet However he is not exercising even though he has no apparent physical limitations were discussed importance of doing this regularly using various options despite weather conditions He will continue on maximum dose metformin and followup in 3 months  2. Obesity: Encouraged him to increase exercise  Reika Callanan 10/17/2012, 10:27 AM

## 2012-10-17 NOTE — Patient Instructions (Addendum)
Check more sugars after meals  Walk early am or go to [facility]

## 2012-10-20 DIAGNOSIS — E669 Obesity, unspecified: Secondary | ICD-10-CM | POA: Insufficient documentation

## 2012-10-21 ENCOUNTER — Other Ambulatory Visit: Payer: Self-pay | Admitting: Endocrinology

## 2012-10-21 NOTE — Telephone Encounter (Signed)
Rx request to pharmacy/[facility]  

## 2012-11-05 DIAGNOSIS — M5430 Sciatica, unspecified side: Secondary | ICD-10-CM | POA: Diagnosis not present

## 2012-11-05 DIAGNOSIS — J069 Acute upper respiratory infection, unspecified: Secondary | ICD-10-CM | POA: Diagnosis not present

## 2012-11-06 ENCOUNTER — Other Ambulatory Visit: Payer: Self-pay

## 2012-11-07 DIAGNOSIS — I4891 Unspecified atrial fibrillation: Secondary | ICD-10-CM | POA: Diagnosis not present

## 2012-11-07 DIAGNOSIS — E669 Obesity, unspecified: Secondary | ICD-10-CM | POA: Diagnosis not present

## 2012-11-07 DIAGNOSIS — I10 Essential (primary) hypertension: Secondary | ICD-10-CM | POA: Diagnosis not present

## 2012-11-07 DIAGNOSIS — G4733 Obstructive sleep apnea (adult) (pediatric): Secondary | ICD-10-CM | POA: Diagnosis not present

## 2012-11-07 DIAGNOSIS — Z7901 Long term (current) use of anticoagulants: Secondary | ICD-10-CM | POA: Diagnosis not present

## 2012-11-14 DIAGNOSIS — Z7901 Long term (current) use of anticoagulants: Secondary | ICD-10-CM | POA: Diagnosis not present

## 2012-11-14 DIAGNOSIS — Z954 Presence of other heart-valve replacement: Secondary | ICD-10-CM | POA: Diagnosis not present

## 2012-11-19 DIAGNOSIS — Q762 Congenital spondylolisthesis: Secondary | ICD-10-CM | POA: Diagnosis not present

## 2012-11-19 DIAGNOSIS — M549 Dorsalgia, unspecified: Secondary | ICD-10-CM | POA: Diagnosis not present

## 2012-11-26 DIAGNOSIS — Q762 Congenital spondylolisthesis: Secondary | ICD-10-CM | POA: Diagnosis not present

## 2012-11-26 DIAGNOSIS — M25559 Pain in unspecified hip: Secondary | ICD-10-CM | POA: Diagnosis not present

## 2012-11-29 ENCOUNTER — Other Ambulatory Visit: Payer: Self-pay | Admitting: Orthopedic Surgery

## 2012-11-29 DIAGNOSIS — M545 Low back pain: Secondary | ICD-10-CM

## 2012-12-04 ENCOUNTER — Ambulatory Visit
Admission: RE | Admit: 2012-12-04 | Discharge: 2012-12-04 | Disposition: A | Discharge status: Home / Self Care | Payer: Medicare Other | Source: Ambulatory Visit | Attending: Orthopedic Surgery | Admitting: Orthopedic Surgery

## 2012-12-04 DIAGNOSIS — Z7901 Long term (current) use of anticoagulants: Secondary | ICD-10-CM | POA: Diagnosis not present

## 2012-12-04 DIAGNOSIS — Z954 Presence of other heart-valve replacement: Secondary | ICD-10-CM | POA: Diagnosis not present

## 2012-12-04 DIAGNOSIS — E669 Obesity, unspecified: Secondary | ICD-10-CM | POA: Diagnosis not present

## 2012-12-04 DIAGNOSIS — E7800 Pure hypercholesterolemia, unspecified: Secondary | ICD-10-CM | POA: Diagnosis not present

## 2012-12-04 DIAGNOSIS — I4891 Unspecified atrial fibrillation: Secondary | ICD-10-CM | POA: Diagnosis not present

## 2012-12-04 DIAGNOSIS — M47817 Spondylosis without myelopathy or radiculopathy, lumbosacral region: Secondary | ICD-10-CM | POA: Diagnosis not present

## 2012-12-04 DIAGNOSIS — I10 Essential (primary) hypertension: Secondary | ICD-10-CM | POA: Diagnosis not present

## 2012-12-04 DIAGNOSIS — M545 Low back pain: Secondary | ICD-10-CM

## 2012-12-04 DIAGNOSIS — I2510 Atherosclerotic heart disease of native coronary artery without angina pectoris: Secondary | ICD-10-CM | POA: Diagnosis not present

## 2012-12-04 DIAGNOSIS — I5030 Unspecified diastolic (congestive) heart failure: Secondary | ICD-10-CM | POA: Diagnosis not present

## 2012-12-04 DIAGNOSIS — M5126 Other intervertebral disc displacement, lumbar region: Secondary | ICD-10-CM | POA: Diagnosis not present

## 2012-12-10 DIAGNOSIS — M47817 Spondylosis without myelopathy or radiculopathy, lumbosacral region: Secondary | ICD-10-CM | POA: Diagnosis not present

## 2012-12-10 DIAGNOSIS — M545 Low back pain: Secondary | ICD-10-CM | POA: Diagnosis not present

## 2013-01-02 ENCOUNTER — Ambulatory Visit (INDEPENDENT_AMBULATORY_CARE_PROVIDER_SITE_OTHER): Payer: Medicare Other | Admitting: Pharmacist

## 2013-01-02 DIAGNOSIS — Z954 Presence of other heart-valve replacement: Secondary | ICD-10-CM

## 2013-01-02 DIAGNOSIS — I4891 Unspecified atrial fibrillation: Secondary | ICD-10-CM | POA: Diagnosis not present

## 2013-01-02 DIAGNOSIS — Z5181 Encounter for therapeutic drug level monitoring: Secondary | ICD-10-CM | POA: Diagnosis not present

## 2013-01-02 DIAGNOSIS — I359 Nonrheumatic aortic valve disorder, unspecified: Secondary | ICD-10-CM | POA: Diagnosis not present

## 2013-01-02 DIAGNOSIS — Z952 Presence of prosthetic heart valve: Secondary | ICD-10-CM | POA: Insufficient documentation

## 2013-01-08 ENCOUNTER — Other Ambulatory Visit: Payer: Self-pay | Admitting: Endocrinology

## 2013-01-30 ENCOUNTER — Ambulatory Visit (INDEPENDENT_AMBULATORY_CARE_PROVIDER_SITE_OTHER): Payer: Medicare Other | Admitting: General Practice

## 2013-01-30 DIAGNOSIS — I359 Nonrheumatic aortic valve disorder, unspecified: Secondary | ICD-10-CM

## 2013-01-30 DIAGNOSIS — Z954 Presence of other heart-valve replacement: Secondary | ICD-10-CM | POA: Diagnosis not present

## 2013-01-30 DIAGNOSIS — I4891 Unspecified atrial fibrillation: Secondary | ICD-10-CM

## 2013-01-30 DIAGNOSIS — Z23 Encounter for immunization: Secondary | ICD-10-CM | POA: Diagnosis not present

## 2013-01-30 LAB — POCT INR: INR: 2.5

## 2013-01-30 MED ORDER — CARVEDILOL 25 MG PO TABS: 25.0000 mg | ORAL_TABLET | Freq: Two times a day (BID) | ORAL | Status: DC

## 2013-02-06 ENCOUNTER — Encounter: Payer: Self-pay | Admitting: Cardiology

## 2013-02-10 ENCOUNTER — Telehealth: Payer: Self-pay

## 2013-02-11 MED ORDER — POTASSIUM CHLORIDE CRYS ER 10 MEQ PO TBCR: 10.0000 meq | EXTENDED_RELEASE_TABLET | Freq: Once | ORAL | Status: DC

## 2013-02-11 NOTE — Telephone Encounter (Signed)
done

## 2013-03-13 ENCOUNTER — Ambulatory Visit (INDEPENDENT_AMBULATORY_CARE_PROVIDER_SITE_OTHER): Payer: Medicare Other | Admitting: Pharmacist

## 2013-03-13 DIAGNOSIS — I4891 Unspecified atrial fibrillation: Secondary | ICD-10-CM

## 2013-03-13 DIAGNOSIS — Z954 Presence of other heart-valve replacement: Secondary | ICD-10-CM

## 2013-03-13 DIAGNOSIS — I359 Nonrheumatic aortic valve disorder, unspecified: Secondary | ICD-10-CM

## 2013-03-13 LAB — POCT INR: INR: 3.5

## 2013-03-31 ENCOUNTER — Other Ambulatory Visit: Payer: Self-pay | Admitting: Endocrinology

## 2013-04-02 ENCOUNTER — Other Ambulatory Visit: Payer: Self-pay

## 2013-04-02 MED ORDER — EZETIMIBE-SIMVASTATIN 10-20 MG PO TABS: 1.0000 | ORAL_TABLET | Freq: Every day | ORAL | Status: DC

## 2013-04-02 MED ORDER — ENALAPRIL MALEATE 20 MG PO TABS: 20.0000 mg | ORAL_TABLET | Freq: Every day | ORAL | Status: DC

## 2013-04-15 DIAGNOSIS — M545 Low back pain, unspecified: Secondary | ICD-10-CM | POA: Diagnosis not present

## 2013-04-15 DIAGNOSIS — M76899 Other specified enthesopathies of unspecified lower limb, excluding foot: Secondary | ICD-10-CM | POA: Diagnosis not present

## 2013-04-16 ENCOUNTER — Telehealth: Payer: Self-pay | Admitting: *Deleted

## 2013-04-16 ENCOUNTER — Other Ambulatory Visit: Payer: Self-pay | Admitting: *Deleted

## 2013-04-16 DIAGNOSIS — E119 Type 2 diabetes mellitus without complications: Secondary | ICD-10-CM

## 2013-04-16 NOTE — Telephone Encounter (Signed)
Patient wants to know if you can check his cholesterol when he comes in for his labs later this month, he said his cardiologist no longer wants to do that.

## 2013-04-16 NOTE — Telephone Encounter (Signed)
Okay, please order lipid panel

## 2013-04-17 ENCOUNTER — Other Ambulatory Visit: Payer: Medicare Other

## 2013-04-24 ENCOUNTER — Ambulatory Visit: Payer: Medicare Other | Admitting: Endocrinology

## 2013-04-24 DIAGNOSIS — M549 Dorsalgia, unspecified: Secondary | ICD-10-CM | POA: Diagnosis not present

## 2013-04-24 DIAGNOSIS — M545 Low back pain, unspecified: Secondary | ICD-10-CM | POA: Diagnosis not present

## 2013-05-01 ENCOUNTER — Ambulatory Visit (INDEPENDENT_AMBULATORY_CARE_PROVIDER_SITE_OTHER): Payer: Medicare Other | Admitting: Pharmacist

## 2013-05-01 DIAGNOSIS — Z954 Presence of other heart-valve replacement: Secondary | ICD-10-CM | POA: Diagnosis not present

## 2013-05-01 DIAGNOSIS — Z5181 Encounter for therapeutic drug level monitoring: Secondary | ICD-10-CM

## 2013-05-01 DIAGNOSIS — I4891 Unspecified atrial fibrillation: Secondary | ICD-10-CM | POA: Diagnosis not present

## 2013-05-01 DIAGNOSIS — I359 Nonrheumatic aortic valve disorder, unspecified: Secondary | ICD-10-CM

## 2013-05-01 DIAGNOSIS — Z7189 Other specified counseling: Secondary | ICD-10-CM | POA: Insufficient documentation

## 2013-05-01 LAB — POCT INR: INR: 4

## 2013-05-02 ENCOUNTER — Other Ambulatory Visit: Payer: Medicare Other

## 2013-05-02 DIAGNOSIS — E119 Type 2 diabetes mellitus without complications: Secondary | ICD-10-CM | POA: Diagnosis not present

## 2013-05-02 LAB — BASIC METABOLIC PANEL
BUN: 23 mg/dL (ref 6–23)
CALCIUM: 9.4 mg/dL (ref 8.4–10.5)
CO2: 26 meq/L (ref 19–32)
Chloride: 103 mEq/L (ref 96–112)
Creatinine, Ser: 1 mg/dL (ref 0.4–1.5)
GFR: 77.2 mL/min (ref >60.00)
GLUCOSE: 89 mg/dL (ref 70–99)
Potassium: 4.2 mEq/L (ref 3.5–5.1)
SODIUM: 136 meq/L (ref 135–145)

## 2013-05-02 LAB — HEMOGLOBIN A1C: Hgb A1c MFr Bld: 5.7 % (ref 4.6–6.5)

## 2013-05-02 LAB — LIPID PANEL
CHOL/HDL RATIO: 2
Cholesterol: 113 mg/dL (ref 0–200)
HDL: 53.9 mg/dL (ref >39.00)
LDL Cholesterol: 42 mg/dL (ref 0–99)
Triglycerides: 88 mg/dL (ref 0.0–149.0)
VLDL: 17.6 mg/dL (ref 0.0–40.0)

## 2013-05-02 LAB — MICROALBUMIN / CREATININE URINE RATIO
CREATININE, U: 131.2 mg/dL
MICROALB/CREAT RATIO: 3.1 mg/g (ref 0.0–30.0)
Microalb, Ur: 4.1 mg/dL — ABNORMAL HIGH (ref 0.0–1.9)

## 2013-05-05 ENCOUNTER — Ambulatory Visit: Payer: Medicare Other | Admitting: Cardiology

## 2013-05-06 ENCOUNTER — Ambulatory Visit: Payer: Medicare Other | Admitting: Endocrinology

## 2013-05-13 ENCOUNTER — Other Ambulatory Visit: Payer: Self-pay | Admitting: *Deleted

## 2013-05-13 ENCOUNTER — Encounter: Payer: Self-pay | Admitting: Endocrinology

## 2013-05-13 ENCOUNTER — Ambulatory Visit (INDEPENDENT_AMBULATORY_CARE_PROVIDER_SITE_OTHER): Payer: Medicare Other | Admitting: Endocrinology

## 2013-05-13 VITALS — BP 128/78 | HR 90 | Temp 98.2°F | Resp 16 | Ht 74.0 in | Wt 272.2 lb

## 2013-05-13 DIAGNOSIS — E669 Obesity, unspecified: Secondary | ICD-10-CM | POA: Diagnosis not present

## 2013-05-13 DIAGNOSIS — E785 Hyperlipidemia, unspecified: Secondary | ICD-10-CM

## 2013-05-13 DIAGNOSIS — IMO0001 Reserved for inherently not codable concepts without codable children: Secondary | ICD-10-CM

## 2013-05-13 DIAGNOSIS — E1165 Type 2 diabetes mellitus with hyperglycemia: Principal | ICD-10-CM

## 2013-05-13 DIAGNOSIS — M79609 Pain in unspecified limb: Secondary | ICD-10-CM | POA: Diagnosis not present

## 2013-05-13 DIAGNOSIS — M76899 Other specified enthesopathies of unspecified lower limb, excluding foot: Secondary | ICD-10-CM | POA: Diagnosis not present

## 2013-05-13 DIAGNOSIS — M25559 Pain in unspecified hip: Secondary | ICD-10-CM | POA: Diagnosis not present

## 2013-05-13 DIAGNOSIS — E119 Type 2 diabetes mellitus without complications: Secondary | ICD-10-CM

## 2013-05-13 LAB — GLUCOSE, POCT (MANUAL RESULT ENTRY): POC GLUCOSE: 198 mg/dL — AB (ref 70–99)

## 2013-05-13 NOTE — Patient Instructions (Signed)
Please check blood sugars at least half the time about 2 hours after any meal and as directed on waking up. Please bring blood sugar monitor to each visit

## 2013-05-13 NOTE — Progress Notes (Signed)
Patient ID: [name], male   DOB: 1935/07/04, 78 y.o.   MRN: [id number]   Reason for Appointment: Diabetes follow-up   History of Present Illness   Diagnosis: Type 2 DIABETES MELITUS date of onset: 07/2010       Oral hypoglycemic drugs: metformin only       Side effects from medications: None He was started on metformin and his baseline A1c was 6.9% and he had significant obesity He was also sent for diabetes education. Subsequently A1c had been upper normal He had been reluctant to check his blood sugars at home and still has not done any Also has significant difficulty losing weight previously but this has improved now He says that over the last couple of months he has had occasional steroid injections and one course of prednisone for his back problem Glucose is rather high today in the office after a high fat lunch   Monitors blood glucose:  none recently Hypoglycemia frequency: Never.           Meals: 3 meals per day. breakfast:occasional cereal otherwise muffin and peanut butter; had cheeseburger and onion rings today Physical activity: exercise: none             Dietician visit: Most [id number]          Complications: are: none Last eye exam: annual    Wt Readings from Last 3 Encounters:  05/13/13 272 lb 3.2 oz (123.469 kg)  10/17/12 288 lb (130.636 kg)  01/18/12 285 lb (129.275 kg)   Lab Results  Component Value Date   HGBA1C 5.7 05/02/2013   HGBA1C 6.1 10/15/2012   HGBA1C  Value: 6.6 (NOTE) The ADA recommends the following therapeutic goal for glycemic control related to Hgb A1c measurement: Goal of therapy: <6.5 Hgb A1c  Reference: American Diabetes Association: Clinical Practice Recommendations 2010, Diabetes Care, 2010, 33: (Suppl  1).* 12/31/2008   Lab Results  Component Value Date   MICROALBUR 4.1* 05/02/2013   LDLCALC 42 05/02/2013   CREATININE 1.0 05/02/2013    HYPERTENSION:  he is on multiple drugs with good control and normal renal  function  HYPERLIPIDEMIA:         The lipid abnormality consists of elevated LDL treated with Vytorin.    No visits with results within 1 Week(s) from this visit. Latest known visit with results is:  Appointment on 05/02/2013  Component Date Value Range Status  . Microalb, Ur 05/02/2013 4.1* 0.0 - 1.9 mg/dL Final  . Creatinine,U 05/02/2013 131.2   Final  . Microalb Creat Ratio 05/02/2013 3.1  0.0 - 30.0 mg/g Final  . Hemoglobin A1C 05/02/2013 5.7  4.6 - 6.5 % Final   Glycemic Control Guidelines for People with Diabetes:Non Diabetic:  <6%Goal of Therapy: <7%Additional Action Suggested:  >8%   . Sodium 05/02/2013 136  135 - 145 mEq/L Final  . Potassium 05/02/2013 4.2  3.5 - 5.1 mEq/L Final  . Chloride 05/02/2013 103  96 - 112 mEq/L Final  . CO2 05/02/2013 26  19 - 32 mEq/L Final  . Glucose, Bld 05/02/2013 89  70 - 99 mg/dL Final  . BUN 05/02/2013 23  6 - 23 mg/dL Final  . Creatinine, Ser 05/02/2013 1.0  0.4 - 1.5 mg/dL Final  . Calcium 05/02/2013 9.4  8.4 - 10.5 mg/dL Final  . GFR 05/02/2013 77.20  >60.00 mL/min Final  . Cholesterol 05/02/2013 113  0 - 200 mg/dL Final   ATP III Classification       Desirable:  <  200 mg/dL               Borderline High:  200 - 239 mg/dL          High:  > = 240 mg/dL  . Triglycerides 05/02/2013 88.0  0.0 - 149.0 mg/dL Final   Normal:  <150 mg/dLBorderline High:  150 - 199 mg/dL  . HDL 05/02/2013 53.90  >39.00 mg/dL Final  . VLDL 05/02/2013 17.6  0.0 - 40.0 mg/dL Final  . LDL Cholesterol 05/02/2013 42  0 - 99 mg/dL Final  . Total CHOL/HDL Ratio 05/02/2013 2   Final                  Men          Women1/2 Average Risk     3.4          3.3Average Risk          5.0          4.42X Average Risk          9.6          7.13X Average Risk          15.0          11.0                          Medication List       This list is accurate as of: 05/13/13  2:22 PM.  Always use your most recent med list.               albuterol (2.5 MG/3ML) 0.083% nebulizer  solution  Commonly known as:  PROVENTIL  Take 2.5 mg by nebulization every 6 (six) hours as needed.     carvedilol 25 MG tablet  Commonly known as:  COREG  Take 1 tablet (25 mg total) by mouth 2 (two) times daily with a meal.     enalapril 20 MG tablet  Commonly known as:  VASOTEC  Take 1 tablet (20 mg total) by mouth daily.     ezetimibe-simvastatin 10-20 MG per tablet  Commonly known as:  VYTORIN  Take 1 tablet by mouth at bedtime.     fluocinonide cream 0.05 %  Commonly known as:  LIDEX  Apply topically 2 (two) times daily.     furosemide 40 MG tablet  Commonly known as:  LASIX  Take 40 mg by mouth daily.     glucose blood test strip  Commonly known as:  ONE TOUCH ULTRA TEST  USE AS DIRECTED TO TEST BLOOD GLUCOSE DX: 250.00     metFORMIN 750 MG 24 hr tablet  Commonly known as:  GLUCOPHAGE-XR  TAKE 3 TABLETS BY MOUTH DAILY AT SUPPER     NON FORMULARY  BIPAP     NON FORMULARY  ONE TOUCH ULTRA TEST STRIPS  AND LANCETS     potassium chloride 10 MEQ tablet  Commonly known as:  KLOR-CON M10  Take 1 tablet (10 mEq total) by mouth once.     Travoprost (BAK Free) 0.004 % Soln ophthalmic solution  Commonly known as:  TRAVATAN  Place 1 drop into both eyes at bedtime.     vitamin B-12 1000 MCG tablet  Commonly known as:  CYANOCOBALAMIN  Take 1,000 mcg by mouth daily.     warfarin 5 MG tablet  Commonly known as:  COUMADIN  Take 5 mg by mouth daily.  Allergies:  Allergies  Allergen Reactions  . Rifampin     REACTION: rash (unclear if due to vanco or rifampin)  . Vancomycin     REACTION: rash (unclear if due to rifampin or vanco)    Past Medical History  Diagnosis Date  . Hx of CABG   . Aortic stenosis   . Chronic atrial fibrillation   . CHF with unknown LVEF     47 %  . Subclavian bypass stenosis   . Rheumatic fever   . Vitamin B12 deficiency   . OSA (obstructive sleep apnea)     Past Surgical History  Procedure Laterality Date  . Heart  bypass    . Cardiac valve surgery      Family History  Problem Relation Age of Onset  . Cancer Father     lung     Social History:  reports that he quit smoking about 21 years ago. His smoking use included Cigarettes. He has a 30 pack-year smoking history. He has never used smokeless tobacco. He reports that he drinks alcohol. He reports that he does not use illicit drugs.  Review of Systems - Cardiovascular ROS: positive for - hypertension and coronary artery disease  No numbness in feet   Examination:   BP 128/78  Pulse 90  Temp(Src) 98.2 F (36.8 C)  Resp 16  Ht 6\' 2"  (1.88 m)  Wt 272 lb 3.2 oz (123.469 kg)  BMI 34.93 kg/m2  SpO2 98%  Body mass index is 34.93 kg/(m^2).    Assesment/PLAN:   1. Diabetes type 2   The patient's diabetes control appears to be overall well controlled although blood sugars are probably higher recently as a result of various steroids by his orthopedic surgeon. He has not been compliant with glucose monitoring and discussed that his blood sugar is relatively high today, partly because of a high fat lunch Recently has not been exercising because of his back pain Discussed need to check his blood sugars more consistently especially after meals and after getting any steroids He will continue on maximum dose metformin for now and call if blood sugars higher  2. Obesity: Encouraged him to increase exercise when he can  [facility] 05/13/2013, 2:22 PM

## 2013-05-15 ENCOUNTER — Encounter: Payer: Self-pay | Admitting: General Surgery

## 2013-05-15 ENCOUNTER — Ambulatory Visit (INDEPENDENT_AMBULATORY_CARE_PROVIDER_SITE_OTHER): Payer: Medicare Other | Admitting: *Deleted

## 2013-05-15 DIAGNOSIS — Z5181 Encounter for therapeutic drug level monitoring: Secondary | ICD-10-CM

## 2013-05-15 DIAGNOSIS — I359 Nonrheumatic aortic valve disorder, unspecified: Secondary | ICD-10-CM

## 2013-05-15 DIAGNOSIS — Z954 Presence of other heart-valve replacement: Secondary | ICD-10-CM

## 2013-05-15 DIAGNOSIS — G4733 Obstructive sleep apnea (adult) (pediatric): Secondary | ICD-10-CM | POA: Insufficient documentation

## 2013-05-15 DIAGNOSIS — I4891 Unspecified atrial fibrillation: Secondary | ICD-10-CM

## 2013-05-15 LAB — POCT INR: INR: 5.8

## 2013-05-22 ENCOUNTER — Ambulatory Visit: Payer: Medicare Other | Admitting: Cardiology

## 2013-05-27 ENCOUNTER — Ambulatory Visit: Payer: Medicare Other | Admitting: Cardiology

## 2013-05-30 ENCOUNTER — Ambulatory Visit (INDEPENDENT_AMBULATORY_CARE_PROVIDER_SITE_OTHER): Payer: Medicare Other | Admitting: Cardiology

## 2013-05-30 ENCOUNTER — Ambulatory Visit (INDEPENDENT_AMBULATORY_CARE_PROVIDER_SITE_OTHER): Payer: Medicare Other | Admitting: Pharmacist

## 2013-05-30 ENCOUNTER — Encounter: Payer: Self-pay | Admitting: Cardiology

## 2013-05-30 VITALS — BP 126/82 | HR 77 | Ht 74.0 in | Wt 267.0 lb

## 2013-05-30 DIAGNOSIS — E669 Obesity, unspecified: Secondary | ICD-10-CM

## 2013-05-30 DIAGNOSIS — G4733 Obstructive sleep apnea (adult) (pediatric): Secondary | ICD-10-CM | POA: Diagnosis not present

## 2013-05-30 DIAGNOSIS — I359 Nonrheumatic aortic valve disorder, unspecified: Secondary | ICD-10-CM

## 2013-05-30 DIAGNOSIS — Z5181 Encounter for therapeutic drug level monitoring: Secondary | ICD-10-CM

## 2013-05-30 DIAGNOSIS — Z954 Presence of other heart-valve replacement: Secondary | ICD-10-CM

## 2013-05-30 DIAGNOSIS — I10 Essential (primary) hypertension: Secondary | ICD-10-CM | POA: Diagnosis not present

## 2013-05-30 DIAGNOSIS — I4891 Unspecified atrial fibrillation: Secondary | ICD-10-CM

## 2013-05-30 LAB — POCT INR: INR: 2.9

## 2013-05-30 NOTE — Progress Notes (Signed)
[place], [place] [place], [place]  [address] Phone: [phone] Fax:  [phone]  Date:  05/30/2013   ID:  [name], DOB September 16, 1935, MRN [id number]  PCP:  Antony Blackbird, MD  Sleep Medicine:  Fransico Him, MD   History of Present Illness: [name] is a 78 y.o. male with a history of OSA, obesity and HTN who presents today for followup.  He is doing well.  He tolerates his BiPAP without any problems.  He feels rested in the am and has no daytime sleepiness.  He tolerates the nasal mask without any problems.  He still snores some.  He does not use the chins.  His AHI today is 5.5/hr and he in 97% complaint in using more than 4 hours nightly.   Wt Readings from Last 3 Encounters:  05/30/13 267 lb (121.11 kg)  05/13/13 272 lb 3.2 oz (123.469 kg)  10/17/12 288 lb (130.636 kg)     Past Medical History  Diagnosis Date  . Hx of CABG   . Aortic stenosis   . Chronic atrial fibrillation   . CHF with unknown LVEF     47 %  . Subclavian bypass stenosis   . Rheumatic fever   . Vitamin B12 deficiency   . OSA (obstructive sleep apnea)   . Hypertension   . Carotid artery disease 1994    s/p left carotid endarerectomy   . Left ventricular dysfunction 2007    LVEF 47%     Current Outpatient Prescriptions  Medication Sig Dispense Refill  . albuterol (PROVENTIL) (2.5 MG/3ML) 0.083% nebulizer solution Take 2.5 mg by nebulization every 6 (six) hours as needed.      . carvedilol (COREG) 25 MG tablet Take 1 tablet (25 mg total) by mouth 2 (two) times daily with a meal.  180 tablet  1  . enalapril (VASOTEC) 20 MG tablet Take 1 tablet (20 mg total) by mouth daily.  90 tablet  2  . ezetimibe-simvastatin (VYTORIN) 10-20 MG per tablet Take 1 tablet by mouth at bedtime.  90 tablet  2  . fluocinonide cream (LIDEX) 0.05 % Apply topically 2 (two) times daily.      . furosemide (LASIX) 40 MG tablet Take 40 mg by mouth daily.      Marland Kitchen glucose blood (ONE TOUCH ULTRA TEST) test strip USE AS  DIRECTED TO TEST BLOOD GLUCOSE DX: 250.00  50 each  2  . HYDROcodone-acetaminophen (NORCO/VICODIN) 5-325 MG per tablet       . metFORMIN (GLUCOPHAGE-XR) 750 MG 24 hr tablet TAKE 3 TABLETS BY MOUTH DAILY AT SUPPER  120 tablet  5  . NON FORMULARY BIPAP      . NON FORMULARY ONE TOUCH ULTRA TEST STRIPS  AND LANCETS      . oxyCODONE-acetaminophen (PERCOCET/ROXICET) 5-325 MG per tablet       . potassium chloride (KLOR-CON M10) 10 MEQ tablet Take 1 tablet (10 mEq total) by mouth once.  30 tablet  10  . vitamin B-12 (CYANOCOBALAMIN) 1000 MCG tablet Take 1,000 mcg by mouth daily.      Marland Kitchen warfarin (COUMADIN) 5 MG tablet Take 5 mg by mouth daily.       No current facility-administered medications for this visit.    Allergies:    Allergies  Allergen Reactions  . Rifampin     REACTION: rash (unclear if due to vanco or rifampin)  . Vancomycin     REACTION: rash (unclear if due to rifampin or vanco)  Social History:  The patient  reports that he quit smoking about 21 years ago. His smoking use included Cigarettes. He has a 30 pack-year smoking history. He has never used smokeless tobacco. He reports that he drinks alcohol. He reports that he does not use illicit drugs.   Family History:  The patient's family history includes Cancer in his father.   ROS:  Please see the history of present illness.      All other systems reviewed and negative.   PHYSICAL EXAM: VS:  BP 126/82  Pulse 77  Ht 6\' 2"  (1.88 m)  Wt 267 lb (121.11 kg)  BMI 34.27 kg/m2  SpO2 97% Well nourished, well developed, in no acute distress HEENT: normal Neck: no JVD Cardiac:  normal S1, S2; RRR; no murmur Lungs:  clear to auscultation bilaterally, no wheezing, rhonchi or rales Abd: soft, nontender, no hepatomegaly Ext: no edema Skin: warm and dry Neuro:  CNs 2-12 intact, no focal abnormalities noted      ASSESSMENT AND PLAN:  1. OSA on BiPAP ASV  - I have encouraged him to use his chin strap since I suspect his AHI is  elevated due to breathing through his mouth and this is also causing snoring.   2. Obesity- his exercise is limited by his leg pain 3. HTN well controlled  Followup with me in 6 months  Signed, Fransico Him, MD 05/30/2013 10:20 AM

## 2013-05-30 NOTE — Patient Instructions (Signed)
Your physician recommends that you continue on your current medications as directed. Please refer to the Current Medication list given to you today.  Your physician recommends that you schedule a follow-up appointment in: 6 Months with Dr Radford Pax

## 2013-06-07 ENCOUNTER — Encounter: Payer: Self-pay | Admitting: *Deleted

## 2013-06-10 ENCOUNTER — Encounter: Payer: Self-pay | Admitting: Cardiology

## 2013-06-10 DIAGNOSIS — M545 Low back pain, unspecified: Secondary | ICD-10-CM | POA: Diagnosis not present

## 2013-06-10 DIAGNOSIS — M48061 Spinal stenosis, lumbar region without neurogenic claudication: Secondary | ICD-10-CM | POA: Diagnosis not present

## 2013-06-11 DIAGNOSIS — M48061 Spinal stenosis, lumbar region without neurogenic claudication: Secondary | ICD-10-CM | POA: Diagnosis not present

## 2013-06-11 DIAGNOSIS — IMO0002 Reserved for concepts with insufficient information to code with codable children: Secondary | ICD-10-CM | POA: Diagnosis not present

## 2013-06-11 DIAGNOSIS — M5430 Sciatica, unspecified side: Secondary | ICD-10-CM | POA: Diagnosis not present

## 2013-06-11 DIAGNOSIS — M545 Low back pain, unspecified: Secondary | ICD-10-CM | POA: Diagnosis not present

## 2013-06-13 ENCOUNTER — Ambulatory Visit (INDEPENDENT_AMBULATORY_CARE_PROVIDER_SITE_OTHER): Payer: Medicare Other | Admitting: *Deleted

## 2013-06-13 DIAGNOSIS — Z5181 Encounter for therapeutic drug level monitoring: Secondary | ICD-10-CM

## 2013-06-13 DIAGNOSIS — I359 Nonrheumatic aortic valve disorder, unspecified: Secondary | ICD-10-CM | POA: Diagnosis not present

## 2013-06-13 DIAGNOSIS — I4891 Unspecified atrial fibrillation: Secondary | ICD-10-CM

## 2013-06-13 DIAGNOSIS — Z954 Presence of other heart-valve replacement: Secondary | ICD-10-CM

## 2013-06-13 LAB — POCT INR: INR: 3

## 2013-06-16 DIAGNOSIS — M5430 Sciatica, unspecified side: Secondary | ICD-10-CM | POA: Diagnosis not present

## 2013-06-16 DIAGNOSIS — M545 Low back pain, unspecified: Secondary | ICD-10-CM | POA: Diagnosis not present

## 2013-06-17 ENCOUNTER — Other Ambulatory Visit: Payer: Self-pay | Admitting: *Deleted

## 2013-06-17 MED ORDER — WARFARIN SODIUM 5 MG PO TABS: ORAL_TABLET | ORAL | Status: DC

## 2013-06-18 DIAGNOSIS — M5430 Sciatica, unspecified side: Secondary | ICD-10-CM | POA: Diagnosis not present

## 2013-06-18 DIAGNOSIS — M545 Low back pain, unspecified: Secondary | ICD-10-CM | POA: Diagnosis not present

## 2013-06-23 DIAGNOSIS — M545 Low back pain, unspecified: Secondary | ICD-10-CM | POA: Diagnosis not present

## 2013-06-23 DIAGNOSIS — M5430 Sciatica, unspecified side: Secondary | ICD-10-CM | POA: Diagnosis not present

## 2013-06-25 DIAGNOSIS — M5430 Sciatica, unspecified side: Secondary | ICD-10-CM | POA: Diagnosis not present

## 2013-06-25 DIAGNOSIS — M545 Low back pain, unspecified: Secondary | ICD-10-CM | POA: Diagnosis not present

## 2013-06-27 ENCOUNTER — Telehealth: Payer: Self-pay | Admitting: Endocrinology

## 2013-06-27 ENCOUNTER — Other Ambulatory Visit: Payer: Self-pay | Admitting: *Deleted

## 2013-06-27 NOTE — Telephone Encounter (Signed)
Dr. Dwyane Dee is not the prescribing Dr. For this medication, he needs to contact Dr. Tamala Julian.  No answer on phone #, unable to leave a message.

## 2013-06-27 NOTE — Telephone Encounter (Signed)
Pt would like rx for vitorin 10/20 sent to his mail order pharmacy [facility] [phone]

## 2013-06-30 DIAGNOSIS — M545 Low back pain, unspecified: Secondary | ICD-10-CM | POA: Diagnosis not present

## 2013-06-30 DIAGNOSIS — M5430 Sciatica, unspecified side: Secondary | ICD-10-CM | POA: Diagnosis not present

## 2013-07-02 DIAGNOSIS — M545 Low back pain, unspecified: Secondary | ICD-10-CM | POA: Diagnosis not present

## 2013-07-02 DIAGNOSIS — M5430 Sciatica, unspecified side: Secondary | ICD-10-CM | POA: Diagnosis not present

## 2013-07-04 ENCOUNTER — Ambulatory Visit (INDEPENDENT_AMBULATORY_CARE_PROVIDER_SITE_OTHER): Payer: Medicare Other | Admitting: Pharmacist

## 2013-07-04 DIAGNOSIS — Z954 Presence of other heart-valve replacement: Secondary | ICD-10-CM

## 2013-07-04 DIAGNOSIS — I359 Nonrheumatic aortic valve disorder, unspecified: Secondary | ICD-10-CM

## 2013-07-04 DIAGNOSIS — I4891 Unspecified atrial fibrillation: Secondary | ICD-10-CM

## 2013-07-04 DIAGNOSIS — Z5181 Encounter for therapeutic drug level monitoring: Secondary | ICD-10-CM

## 2013-07-04 LAB — POCT INR: INR: 4.1

## 2013-07-07 DIAGNOSIS — M545 Low back pain, unspecified: Secondary | ICD-10-CM | POA: Diagnosis not present

## 2013-07-07 DIAGNOSIS — M5430 Sciatica, unspecified side: Secondary | ICD-10-CM | POA: Diagnosis not present

## 2013-07-09 DIAGNOSIS — M545 Low back pain, unspecified: Secondary | ICD-10-CM | POA: Diagnosis not present

## 2013-07-09 DIAGNOSIS — M5430 Sciatica, unspecified side: Secondary | ICD-10-CM | POA: Diagnosis not present

## 2013-07-14 DIAGNOSIS — M545 Low back pain, unspecified: Secondary | ICD-10-CM | POA: Diagnosis not present

## 2013-07-14 DIAGNOSIS — M5430 Sciatica, unspecified side: Secondary | ICD-10-CM | POA: Diagnosis not present

## 2013-07-16 DIAGNOSIS — M545 Low back pain, unspecified: Secondary | ICD-10-CM | POA: Diagnosis not present

## 2013-07-18 ENCOUNTER — Ambulatory Visit (INDEPENDENT_AMBULATORY_CARE_PROVIDER_SITE_OTHER): Payer: Medicare Other | Admitting: Pharmacist

## 2013-07-18 DIAGNOSIS — Z954 Presence of other heart-valve replacement: Secondary | ICD-10-CM

## 2013-07-18 DIAGNOSIS — I4891 Unspecified atrial fibrillation: Secondary | ICD-10-CM | POA: Diagnosis not present

## 2013-07-18 DIAGNOSIS — Z5181 Encounter for therapeutic drug level monitoring: Secondary | ICD-10-CM | POA: Diagnosis not present

## 2013-07-18 DIAGNOSIS — I359 Nonrheumatic aortic valve disorder, unspecified: Secondary | ICD-10-CM | POA: Diagnosis not present

## 2013-07-18 LAB — POCT INR: INR: 3

## 2013-07-21 DIAGNOSIS — M5430 Sciatica, unspecified side: Secondary | ICD-10-CM | POA: Diagnosis not present

## 2013-07-21 DIAGNOSIS — M545 Low back pain, unspecified: Secondary | ICD-10-CM | POA: Diagnosis not present

## 2013-07-24 DIAGNOSIS — M545 Low back pain, unspecified: Secondary | ICD-10-CM | POA: Diagnosis not present

## 2013-07-24 DIAGNOSIS — M5430 Sciatica, unspecified side: Secondary | ICD-10-CM | POA: Diagnosis not present

## 2013-07-29 DIAGNOSIS — M5430 Sciatica, unspecified side: Secondary | ICD-10-CM | POA: Diagnosis not present

## 2013-07-29 DIAGNOSIS — M545 Low back pain, unspecified: Secondary | ICD-10-CM | POA: Diagnosis not present

## 2013-08-04 ENCOUNTER — Other Ambulatory Visit: Payer: Self-pay | Admitting: *Deleted

## 2013-08-04 MED ORDER — METFORMIN HCL ER 750 MG PO TB24: 750.0000 mg | ORAL_TABLET | Freq: Every day | ORAL | Status: DC

## 2013-08-05 ENCOUNTER — Other Ambulatory Visit: Payer: Self-pay

## 2013-08-05 ENCOUNTER — Other Ambulatory Visit: Payer: Self-pay | Admitting: *Deleted

## 2013-08-05 MED ORDER — WARFARIN SODIUM 5 MG PO TABS: ORAL_TABLET | ORAL | Status: DC

## 2013-08-05 MED ORDER — POTASSIUM CHLORIDE CRYS ER 10 MEQ PO TBCR: 10.0000 meq | EXTENDED_RELEASE_TABLET | Freq: Once | ORAL | Status: DC

## 2013-08-08 ENCOUNTER — Ambulatory Visit (INDEPENDENT_AMBULATORY_CARE_PROVIDER_SITE_OTHER): Payer: Medicare Other | Admitting: Pharmacist

## 2013-08-08 DIAGNOSIS — I359 Nonrheumatic aortic valve disorder, unspecified: Secondary | ICD-10-CM | POA: Diagnosis not present

## 2013-08-08 DIAGNOSIS — I4891 Unspecified atrial fibrillation: Secondary | ICD-10-CM

## 2013-08-08 DIAGNOSIS — Z5181 Encounter for therapeutic drug level monitoring: Secondary | ICD-10-CM

## 2013-08-08 DIAGNOSIS — Z954 Presence of other heart-valve replacement: Secondary | ICD-10-CM | POA: Diagnosis not present

## 2013-08-08 LAB — POCT INR: INR: 2.4

## 2013-08-09 ENCOUNTER — Telehealth: Payer: Self-pay | Admitting: Internal Medicine

## 2013-08-10 NOTE — Telephone Encounter (Signed)
Error

## 2013-08-11 ENCOUNTER — Other Ambulatory Visit (INDEPENDENT_AMBULATORY_CARE_PROVIDER_SITE_OTHER): Payer: Medicare Other

## 2013-08-11 DIAGNOSIS — E119 Type 2 diabetes mellitus without complications: Secondary | ICD-10-CM

## 2013-08-11 DIAGNOSIS — E1165 Type 2 diabetes mellitus with hyperglycemia: Principal | ICD-10-CM

## 2013-08-11 DIAGNOSIS — IMO0001 Reserved for inherently not codable concepts without codable children: Secondary | ICD-10-CM | POA: Diagnosis not present

## 2013-08-11 LAB — COMPREHENSIVE METABOLIC PANEL
ALBUMIN: 4 g/dL (ref 3.5–5.2)
ALT: 12 U/L (ref 0–53)
AST: 19 U/L (ref 0–37)
Alkaline Phosphatase: 54 U/L (ref 39–117)
BUN: 17 mg/dL (ref 6–23)
CO2: 26 mEq/L (ref 19–32)
Calcium: 9.3 mg/dL (ref 8.4–10.5)
Chloride: 105 mEq/L (ref 96–112)
Creatinine, Ser: 0.9 mg/dL (ref 0.4–1.5)
GFR: 91.81 mL/min (ref >60.00)
Glucose, Bld: 105 mg/dL — ABNORMAL HIGH (ref 70–99)
Potassium: 4.5 mEq/L (ref 3.5–5.1)
SODIUM: 138 meq/L (ref 135–145)
TOTAL PROTEIN: 7 g/dL (ref 6.0–8.3)
Total Bilirubin: 1 mg/dL (ref 0.2–1.2)

## 2013-08-11 LAB — LIPID PANEL
Cholesterol: 118 mg/dL (ref 0–200)
HDL: 48.6 mg/dL (ref >39.00)
LDL CALC: 58 mg/dL (ref 0–99)
Total CHOL/HDL Ratio: 2
Triglycerides: 58 mg/dL (ref 0.0–149.0)
VLDL: 11.6 mg/dL (ref 0.0–40.0)

## 2013-08-11 LAB — HEMOGLOBIN A1C: HEMOGLOBIN A1C: 5.2 % (ref 4.6–6.5)

## 2013-08-14 ENCOUNTER — Ambulatory Visit (INDEPENDENT_AMBULATORY_CARE_PROVIDER_SITE_OTHER): Payer: Medicare Other | Admitting: Endocrinology

## 2013-08-14 ENCOUNTER — Encounter: Payer: Self-pay | Admitting: Endocrinology

## 2013-08-14 ENCOUNTER — Other Ambulatory Visit: Payer: Self-pay | Admitting: *Deleted

## 2013-08-14 VITALS — BP 126/72 | HR 87 | Temp 98.0°F | Resp 16 | Ht 74.0 in | Wt 273.2 lb

## 2013-08-14 DIAGNOSIS — E119 Type 2 diabetes mellitus without complications: Secondary | ICD-10-CM

## 2013-08-14 MED ORDER — GLUCOSE BLOOD VI STRP: ORAL_STRIP | Status: DC

## 2013-08-14 MED ORDER — ONETOUCH DELICA LANCETS FINE MISC: Status: DC

## 2013-08-14 NOTE — Progress Notes (Signed)
Patient ID: [name], male   DOB: December 23, 1935, 78 y.o.   MRN: [id number]   Reason for Appointment: Diabetes follow-up   History of Present Illness   Diagnosis: Type 2 DIABETES MELITUS date of onset: 07/2010       Oral hypoglycemic drugs: metformin only       Side effects from medications: None He was started on metformin when his baseline A1c was 6.9% and he had significant obesity He was also sent for diabetes education. Subsequently A1c had been upper normal Also has significant difficulty losing weight especially with limited ability to exercise Glucose is very well controlled and he has done some readings at home also    Monitors blood glucose:  0.3 times a day  PREMEAL Breakfast Lunch  8-10 PM  Bedtime Overall  Glucose range: ?   105, 128   86-128     Mean/median:      105    Hypoglycemia frequency: Never.           Meals: 3 meals per day. breakfast:occasional cereal otherwise muffin and peanut butter; occasionally sweets Physical activity: exercise: walking 10 min, 3/7 days a week           Dietician visit: Most [id number]          Complications: are: none Last eye exam: annual    Wt Readings from Last 3 Encounters:  08/14/13 273 lb 3.2 oz (123.923 kg)  05/30/13 267 lb (121.11 kg)  05/13/13 272 lb 3.2 oz (123.469 kg)   Lab Results  Component Value Date   HGBA1C 5.2 08/11/2013   HGBA1C 5.7 05/02/2013   HGBA1C 6.1 10/15/2012   Lab Results  Component Value Date   MICROALBUR 4.1* 05/02/2013   LDLCALC 58 08/11/2013   CREATININE 0.9 08/11/2013    HYPERTENSION:  he is on multiple drugs with good control and normal renal function  HYPERLIPIDEMIA:         The lipid abnormality consists of elevated LDL treated with Vytorin.    Appointment on 08/11/2013  Component Date Value Ref Range Status  . Hemoglobin A1C 08/11/2013 5.2  4.6 - 6.5 % Final   Glycemic Control Guidelines for People with Diabetes:Non Diabetic:  <6%Goal of Therapy: <7%Additional Action Suggested:  >8%    . Sodium 08/11/2013 138  135 - 145 mEq/L Final  . Potassium 08/11/2013 4.5  3.5 - 5.1 mEq/L Final  . Chloride 08/11/2013 105  96 - 112 mEq/L Final  . CO2 08/11/2013 26  19 - 32 mEq/L Final  . Glucose, Bld 08/11/2013 105* 70 - 99 mg/dL Final  . BUN 08/11/2013 17  6 - 23 mg/dL Final  . Creatinine, Ser 08/11/2013 0.9  0.4 - 1.5 mg/dL Final  . Total Bilirubin 08/11/2013 1.0  0.2 - 1.2 mg/dL Final  . Alkaline Phosphatase 08/11/2013 54  39 - 117 U/L Final  . AST 08/11/2013 19  0 - 37 U/L Final  . ALT 08/11/2013 12  0 - 53 U/L Final  . Total Protein 08/11/2013 7.0  6.0 - 8.3 g/dL Final  . Albumin 08/11/2013 4.0  3.5 - 5.2 g/dL Final  . Calcium 08/11/2013 9.3  8.4 - 10.5 mg/dL Final  . GFR 08/11/2013 91.81  >60.00 mL/min Final  . Cholesterol 08/11/2013 118  0 - 200 mg/dL Final   ATP III Classification       Desirable:  < 200 mg/dL               Borderline High:  200 - 239 mg/dL          High:  > = 240 mg/dL  . Triglycerides 08/11/2013 58.0  0.0 - 149.0 mg/dL Final   Normal:  <150 mg/dLBorderline High:  150 - 199 mg/dL  . HDL 08/11/2013 48.60  >39.00 mg/dL Final  . VLDL 08/11/2013 11.6  0.0 - 40.0 mg/dL Final  . LDL Cholesterol 08/11/2013 58  0 - 99 mg/dL Final  . Total CHOL/HDL Ratio 08/11/2013 2   Final                  Men          Women1/2 Average Risk     3.4          3.3Average Risk          5.0          4.42X Average Risk          9.6          7.13X Average Risk          15.0          11.0                      Anti-coag visit on 08/08/2013  Component Date Value Ref Range Status  . INR 08/08/2013 2.4   Final      Medication List       This list is accurate as of: 08/14/13 10:24 AM.  Always use your most recent med list.               albuterol (2.5 MG/3ML) 0.083% nebulizer solution  Commonly known as:  PROVENTIL  Take 2.5 mg by nebulization every 6 (six) hours as needed.     carvedilol 25 MG tablet  Commonly known as:  COREG  Take 1 tablet (25 mg total) by mouth 2 (two)  times daily with a meal.     enalapril 20 MG tablet  Commonly known as:  VASOTEC  Take 1 tablet (20 mg total) by mouth daily.     ezetimibe-simvastatin 10-20 MG per tablet  Commonly known as:  VYTORIN  Take 1 tablet by mouth at bedtime.     fluocinonide cream 0.05 %  Commonly known as:  LIDEX  Apply topically 2 (two) times daily.     furosemide 40 MG tablet  Commonly known as:  LASIX  Take 40 mg by mouth daily.     glucose blood test strip  Commonly known as:  ONE TOUCH ULTRA TEST  USE AS DIRECTED TO TEST BLOOD GLUCOSE DX: 250.00     HYDROcodone-acetaminophen 5-325 MG per tablet  Commonly known as:  NORCO/VICODIN     metFORMIN 750 MG 24 hr tablet  Commonly known as:  GLUCOPHAGE-XR  Take 1 tablet (750 mg total) by mouth daily with breakfast.     NON FORMULARY  BIPAP     NON FORMULARY  ONE TOUCH ULTRA TEST STRIPS  AND LANCETS     oxyCODONE-acetaminophen 5-325 MG per tablet  Commonly known as:  PERCOCET/ROXICET     potassium chloride 10 MEQ tablet  Commonly known as:  KLOR-CON M10  Take 1 tablet (10 mEq total) by mouth once.     vitamin B-12 1000 MCG tablet  Commonly known as:  CYANOCOBALAMIN  Take 1,000 mcg by mouth daily.     warfarin 5 MG tablet  Commonly known as:  COUMADIN  1 tablet everyday except 1.5 tablets on  Saturday or as directed by coumadin clinic        Allergies:  Allergies  Allergen Reactions  . Rifampin     REACTION: rash (unclear if due to vanco or rifampin)  . Vancomycin     REACTION: rash (unclear if due to rifampin or vanco)    Past Medical History  Diagnosis Date  . Hx of CABG   . Aortic stenosis   . Chronic atrial fibrillation   . CHF with unknown LVEF     47 %  . Subclavian bypass stenosis   . Rheumatic fever   . Vitamin B12 deficiency   . OSA (obstructive sleep apnea)   . Hypertension   . Carotid artery disease 1994    s/p left carotid endarerectomy   . Left ventricular dysfunction 2007    LVEF 47%     Past Surgical  History  Procedure Laterality Date  . Heart bypass    . Cardiac valve surgery  1994    replaced due to aortic stenosis, [employer] mechanical prostesis  . Coronary artery bypass graft  1994    w SVG to RCA and SVG to circumflex    Family History  Problem Relation Age of Onset  . Cancer Father     lung     Social History:  reports that he quit smoking about 21 years ago. His smoking use included Cigarettes. He has a 30 pack-year smoking history. He has never used smokeless tobacco. He reports that he drinks alcohol. He reports that he does not use illicit drugs.  Review of Systems - Cardiovascular ROS: positive for - hypertension and coronary artery disease  Has had less back pain and has finished physical therapy   Examination:   BP 126/72  Pulse 87  Temp(Src) 98 F (36.7 C)  Resp 16  Ht 6\' 2"  (1.88 m)  Wt 273 lb 3.2 oz (123.923 kg)  BMI 35.06 kg/m2  SpO2 93%  Body mass index is 35.06 kg/(m^2).    Assesment/PLAN:   1. Diabetes type 2   The patient's diabetes control appears to be overall well controlled with normal A1c He has started walking a little and hopefully will start losing weight also He may be able to do better with diet also with doing some postprandial glucose monitoring He will continue on maximum dose metformin for now and call if blood sugars higher  2. Obesity: Consider Victoza if weight gain continues  Elayne Snare 08/14/2013, 10:24 AM

## 2013-08-14 NOTE — Patient Instructions (Signed)
Continue increasing walking  Low carb, low fat diet

## 2013-08-29 ENCOUNTER — Ambulatory Visit (INDEPENDENT_AMBULATORY_CARE_PROVIDER_SITE_OTHER): Payer: Medicare Other | Admitting: Pharmacist

## 2013-08-29 DIAGNOSIS — I4891 Unspecified atrial fibrillation: Secondary | ICD-10-CM

## 2013-08-29 DIAGNOSIS — Z954 Presence of other heart-valve replacement: Secondary | ICD-10-CM | POA: Diagnosis not present

## 2013-08-29 DIAGNOSIS — Z5181 Encounter for therapeutic drug level monitoring: Secondary | ICD-10-CM | POA: Diagnosis not present

## 2013-08-29 DIAGNOSIS — I359 Nonrheumatic aortic valve disorder, unspecified: Secondary | ICD-10-CM | POA: Diagnosis not present

## 2013-08-29 LAB — POCT INR: INR: 3

## 2013-09-18 ENCOUNTER — Other Ambulatory Visit: Payer: Self-pay | Admitting: *Deleted

## 2013-09-18 MED ORDER — METFORMIN HCL ER 750 MG PO TB24: ORAL_TABLET | ORAL | Status: DC

## 2013-09-26 ENCOUNTER — Ambulatory Visit (INDEPENDENT_AMBULATORY_CARE_PROVIDER_SITE_OTHER): Payer: Medicare Other | Admitting: Pharmacist

## 2013-09-26 DIAGNOSIS — Z954 Presence of other heart-valve replacement: Secondary | ICD-10-CM

## 2013-09-26 DIAGNOSIS — I359 Nonrheumatic aortic valve disorder, unspecified: Secondary | ICD-10-CM

## 2013-09-26 DIAGNOSIS — Z5181 Encounter for therapeutic drug level monitoring: Secondary | ICD-10-CM | POA: Diagnosis not present

## 2013-09-26 LAB — POCT INR: INR: 2.5

## 2013-09-26 MED ORDER — CARVEDILOL 25 MG PO TABS: 25.0000 mg | ORAL_TABLET | Freq: Two times a day (BID) | ORAL | Status: DC

## 2013-10-17 DIAGNOSIS — I509 Heart failure, unspecified: Secondary | ICD-10-CM | POA: Diagnosis not present

## 2013-10-17 DIAGNOSIS — E785 Hyperlipidemia, unspecified: Secondary | ICD-10-CM | POA: Diagnosis not present

## 2013-10-17 DIAGNOSIS — I6529 Occlusion and stenosis of unspecified carotid artery: Secondary | ICD-10-CM | POA: Diagnosis not present

## 2013-10-20 ENCOUNTER — Other Ambulatory Visit: Payer: Self-pay | Admitting: Family Medicine

## 2013-10-20 DIAGNOSIS — I6529 Occlusion and stenosis of unspecified carotid artery: Secondary | ICD-10-CM

## 2013-10-22 ENCOUNTER — Ambulatory Visit
Admission: RE | Admit: 2013-10-22 | Discharge: 2013-10-22 | Disposition: A | Discharge status: Home / Self Care | Payer: Medicare Other | Source: Ambulatory Visit | Attending: Family Medicine | Admitting: Family Medicine

## 2013-10-22 DIAGNOSIS — I6529 Occlusion and stenosis of unspecified carotid artery: Secondary | ICD-10-CM

## 2013-10-22 DIAGNOSIS — I658 Occlusion and stenosis of other precerebral arteries: Secondary | ICD-10-CM | POA: Diagnosis not present

## 2013-10-24 ENCOUNTER — Ambulatory Visit (INDEPENDENT_AMBULATORY_CARE_PROVIDER_SITE_OTHER): Payer: Medicare Other | Admitting: Pharmacist

## 2013-10-24 DIAGNOSIS — I359 Nonrheumatic aortic valve disorder, unspecified: Secondary | ICD-10-CM | POA: Diagnosis not present

## 2013-10-24 DIAGNOSIS — Z954 Presence of other heart-valve replacement: Secondary | ICD-10-CM | POA: Diagnosis not present

## 2013-10-24 DIAGNOSIS — Z5181 Encounter for therapeutic drug level monitoring: Secondary | ICD-10-CM

## 2013-10-24 LAB — POCT INR: INR: 2.5

## 2013-11-20 ENCOUNTER — Other Ambulatory Visit: Payer: Self-pay | Admitting: Interventional Cardiology

## 2013-12-10 ENCOUNTER — Encounter: Payer: Self-pay | Admitting: Interventional Cardiology

## 2013-12-10 ENCOUNTER — Ambulatory Visit (INDEPENDENT_AMBULATORY_CARE_PROVIDER_SITE_OTHER): Payer: Medicare Other | Admitting: Interventional Cardiology

## 2013-12-10 ENCOUNTER — Ambulatory Visit (INDEPENDENT_AMBULATORY_CARE_PROVIDER_SITE_OTHER): Payer: Medicare Other | Admitting: Pharmacist

## 2013-12-10 VITALS — BP 114/68 | HR 67 | Ht 73.0 in | Wt 269.0 lb

## 2013-12-10 DIAGNOSIS — I482 Chronic atrial fibrillation, unspecified: Secondary | ICD-10-CM

## 2013-12-10 DIAGNOSIS — Z9889 Other specified postprocedural states: Secondary | ICD-10-CM

## 2013-12-10 DIAGNOSIS — I4891 Unspecified atrial fibrillation: Secondary | ICD-10-CM | POA: Diagnosis not present

## 2013-12-10 DIAGNOSIS — I359 Nonrheumatic aortic valve disorder, unspecified: Secondary | ICD-10-CM | POA: Diagnosis not present

## 2013-12-10 DIAGNOSIS — Z5181 Encounter for therapeutic drug level monitoring: Secondary | ICD-10-CM

## 2013-12-10 DIAGNOSIS — I5032 Chronic diastolic (congestive) heart failure: Secondary | ICD-10-CM

## 2013-12-10 DIAGNOSIS — I2510 Atherosclerotic heart disease of native coronary artery without angina pectoris: Secondary | ICD-10-CM | POA: Diagnosis not present

## 2013-12-10 DIAGNOSIS — Z954 Presence of other heart-valve replacement: Secondary | ICD-10-CM

## 2013-12-10 DIAGNOSIS — I10 Essential (primary) hypertension: Secondary | ICD-10-CM

## 2013-12-10 DIAGNOSIS — E119 Type 2 diabetes mellitus without complications: Secondary | ICD-10-CM

## 2013-12-10 LAB — POCT INR: INR: 2

## 2013-12-10 NOTE — Patient Instructions (Signed)
Your physician recommends that you continue on your current medications as directed. Please refer to the Current Medication list given to you today.  Your physician has requested that you have an echocardiogram. Echocardiography is a painless test that uses sound waves to create images of your heart. It provides your doctor with information about the size and shape of your heart and how well your heart's chambers and valves are working. This procedure takes approximately one hour. There are no restrictions for this procedure.  Your physician wants you to follow-up in: 1 year with Dr. Smith.  You will receive a reminder letter in the mail two months in advance. If you don't receive a letter, please call our office to schedule the follow-up appointment.  

## 2013-12-10 NOTE — Progress Notes (Signed)
Patient ID: [name], male   DOB: [birth year], 78 y.o.   MRN: [id number]    [address], [address], [place]  [address] Phone: [phone] Fax:  [phone]  Date:  12/10/2013   ID:  [name], DOB 29-Jun-1935, MRN [id number]  PCP:  Antony Blackbird, MD   ASSESSMENT:  1. mechanical aortic valve, asymptomatic 2. Chronic atrial fibrillation, with rate control 3. Chronic anticoagulation, with no bleeding complications 4. Chronic diastolic heart failure, with dyspnea on exertion 5. Dyspnea on exertion, likely related to deconditioning. Rule out change in cardiac function/valve function 6. Essential hypertension, controlled 7. Coronary atherosclerosis with history of bypass surgery, asymptomatic A. Carotid endarterectomy, asymptomatic  PLAN:  1. 2-D Doppler echocardiogram to assess LV systolic function and to exclude mechanical valve dysfunction 2. I encouraged an active lifestyle 3. Clinic followup in one year 4. No change in medical regimen   SUBJECTIVE: [name] is a 78 y.o. male who is doing well. He has become less active. He had difficulty with lumbar disc disease. He has not had syncope. He denies prolonged palpitations and has not had syncope. No chest pain or bleeding. Denies neurological symptoms. Appetite is been stable. No chills or fever.   Wt Readings from Last 3 Encounters:  12/10/13 269 lb (122.018 kg)  08/14/13 273 lb 3.2 oz (123.923 kg)  05/30/13 267 lb (121.11 kg)     Past Medical History  Diagnosis Date  . Hx of CABG   . Aortic stenosis   . Chronic atrial fibrillation   . CHF with unknown LVEF     47 %  . Subclavian bypass stenosis   . Rheumatic fever   . Vitamin B12 deficiency   . OSA (obstructive sleep apnea)   . Hypertension   . Carotid artery disease 1994    s/p left carotid endarerectomy   . Left ventricular dysfunction 2007    LVEF 47%     Current Outpatient Prescriptions  Medication Sig Dispense Refill  . albuterol  (PROVENTIL) (2.5 MG/3ML) 0.083% nebulizer solution Take 2.5 mg by nebulization every 6 (six) hours as needed.      . carvedilol (COREG) 25 MG tablet Take 1 tablet (25 mg total) by mouth 2 (two) times daily with a meal.  180 tablet  0  . enalapril (VASOTEC) 20 MG tablet Take 1 tablet (20 mg total) by mouth daily.  90 tablet  2  . ezetimibe-simvastatin (VYTORIN) 10-20 MG per tablet Take 1 tablet by mouth at bedtime.  90 tablet  2  . fluocinonide cream (LIDEX) 0.05 % Apply topically 2 (two) times daily.      . furosemide (LASIX) 40 MG tablet Take 40 mg by mouth as needed.       Marland Kitchen glucose blood (ONE TOUCH ULTRA TEST) test strip USE AS DIRECTED TO TEST BLOOD GLUCOSE  EVERY OTHER DAY DX: 250.00  50 each  2  . metFORMIN (GLUCOPHAGE-XR) 750 MG 24 hr tablet Take 3 tablets daily  270 tablet  1  . NON FORMULARY BIPAP      . NON FORMULARY ONE TOUCH ULTRA TEST STRIPS  AND LANCETS      . ONETOUCH DELICA LANCETS FINE MISC Use to obtain a blood specimen every other day Dx code 250.00  100 each  1  . potassium chloride (KLOR-CON M10) 10 MEQ tablet Take 1 tablet (10 mEq total) by mouth once.  30 tablet  3  . vitamin B-12 (CYANOCOBALAMIN) 1000 MCG tablet Take 1,000  mcg by mouth daily.      Marland Kitchen warfarin (COUMADIN) 5 MG tablet Take as directed by Coumadin clinic  45 tablet  3   No current facility-administered medications for this visit.    Allergies:    Allergies  Allergen Reactions  . Rifampin     REACTION: rash (unclear if due to vanco or rifampin)  . Vancomycin     REACTION: rash (unclear if due to rifampin or vanco)    Social History:  The patient  reports that he quit smoking about 21 years ago. His smoking use included Cigarettes. He has a 30 pack-year smoking history. He has never used smokeless tobacco. He reports that he drinks alcohol. He reports that he does not use illicit drugs.   ROS:  Please see the history of present illness.   Appetite is been stable. No orthopnea PND. Denies angina.   All  other systems reviewed and negative.   OBJECTIVE: VS:  BP 114/68  Pulse 67  Ht 6\' 1"  (1.854 m)  Wt 269 lb (122.018 kg)  BMI 35.50 kg/m2 Well nourished, well developed, in no acute distress, obese, elderly HEENT: normal Neck: JVD flat. Carotid bruit absent  Cardiac:  normal S1, S2; IIRR; no murmur. Mechanical sounds are heard. Lungs:  clear to auscultation bilaterally, no wheezing, rhonchi or rales Abd: soft, nontender, no hepatomegaly Ext: Edema right greater than left lower extremity edema. Pulses 2+ and symmetric Skin: warm and dry Neuro:  CNs 2-12 intact, no focal abnormalities noted  EKG:  Chronic atrial fibrillation otherwise unremarkable. Control rate is 67 beats per minute       Signed, Illene Labrador III, MD 12/10/2013 10:44 AM

## 2013-12-19 ENCOUNTER — Encounter: Payer: Self-pay | Admitting: Cardiology

## 2013-12-19 ENCOUNTER — Ambulatory Visit (INDEPENDENT_AMBULATORY_CARE_PROVIDER_SITE_OTHER): Payer: Medicare Other | Admitting: Cardiology

## 2013-12-19 VITALS — BP 112/80 | HR 58 | Ht 74.0 in | Wt 271.0 lb

## 2013-12-19 DIAGNOSIS — E669 Obesity, unspecified: Secondary | ICD-10-CM | POA: Diagnosis not present

## 2013-12-19 DIAGNOSIS — I10 Essential (primary) hypertension: Secondary | ICD-10-CM | POA: Diagnosis not present

## 2013-12-19 DIAGNOSIS — G4733 Obstructive sleep apnea (adult) (pediatric): Secondary | ICD-10-CM

## 2013-12-19 DIAGNOSIS — I2510 Atherosclerotic heart disease of native coronary artery without angina pectoris: Secondary | ICD-10-CM

## 2013-12-19 DIAGNOSIS — I4891 Unspecified atrial fibrillation: Secondary | ICD-10-CM

## 2013-12-19 DIAGNOSIS — I482 Chronic atrial fibrillation, unspecified: Secondary | ICD-10-CM

## 2013-12-19 DIAGNOSIS — E118 Type 2 diabetes mellitus with unspecified complications: Secondary | ICD-10-CM

## 2013-12-19 NOTE — Patient Instructions (Signed)
Continue current CPAP/BiPAP Settings.  We will be ordering a new CPAP mask for you called ResMED Airfit F10 and then we will get a download 4 weeks after that.  Your physician wants you to follow-up in: 6 months with Dr Mallie Snooks will receive a reminder letter in the mail two months in advance. If you don't receive a letter, please call our office to schedule the follow-up appointment.

## 2013-12-19 NOTE — Progress Notes (Signed)
[place] [place], [place]  [address] Phone: [phone] Fax:  [phone]  Date:  12/19/2013   ID:  [name], DOB 06-09-1935, MRN [id number]  PCP:  Antony Blackbird, MD  Cardiologist:  Fransico Him, MD     History of Present Illness: [name] is a 78 y.o. male with a history of OSA, obesity and HTN who presents today for followup. He is doing well. He tolerates his BiPAP without any problems. He feels rested in the am and has no daytime sleepiness. He tolerates the nasal mask without any problems but he still snores some. He tried the chin strap but says it does not help.  He also says that the mask is leaking as shown on his download with a max leak of 89.5L/min   His AHI today is 6/hr and he in 98% complaint in using more than 4 hours nightly.    Wt Readings from Last 3 Encounters:  12/19/13 271 lb (122.925 kg)  12/10/13 269 lb (122.018 kg)  08/14/13 273 lb 3.2 oz (123.923 kg)     Past Medical History  Diagnosis Date  . Hx of CABG   . Aortic stenosis   . Chronic atrial fibrillation   . CHF with unknown LVEF     47 %  . Subclavian bypass stenosis   . Rheumatic fever   . Vitamin B12 deficiency   . OSA (obstructive sleep apnea)   . Hypertension   . Carotid artery disease 1994    s/p left carotid endarerectomy   . Left ventricular dysfunction 2007    LVEF 47%     Current Outpatient Prescriptions  Medication Sig Dispense Refill  . albuterol (PROVENTIL) (2.5 MG/3ML) 0.083% nebulizer solution Take 2.5 mg by nebulization every 6 (six) hours as needed.      . carvedilol (COREG) 25 MG tablet Take 1 tablet (25 mg total) by mouth 2 (two) times daily with a meal.  180 tablet  0  . enalapril (VASOTEC) 20 MG tablet Take 1 tablet (20 mg total) by mouth daily.  90 tablet  2  . ezetimibe-simvastatin (VYTORIN) 10-20 MG per tablet Take 1 tablet by mouth at bedtime.  90 tablet  2  . fluocinonide cream (LIDEX) 0.05 % Apply topically 2 (two) times daily.      .  furosemide (LASIX) 40 MG tablet Take 40 mg by mouth as needed.       Marland Kitchen glucose blood (ONE TOUCH ULTRA TEST) test strip USE AS DIRECTED TO TEST BLOOD GLUCOSE  EVERY OTHER DAY DX: 250.00  50 each  2  . metFORMIN (GLUCOPHAGE-XR) 750 MG 24 hr tablet Take 3 tablets daily  270 tablet  1  . NON FORMULARY BIPAP      . NON FORMULARY ONE TOUCH ULTRA TEST STRIPS  AND LANCETS      . ONETOUCH DELICA LANCETS FINE MISC Use to obtain a blood specimen every other day Dx code 250.00  100 each  1  . potassium chloride (KLOR-CON M10) 10 MEQ tablet Take 1 tablet (10 mEq total) by mouth once.  30 tablet  3  . vitamin B-12 (CYANOCOBALAMIN) 1000 MCG tablet Take 1,000 mcg by mouth daily.      Marland Kitchen warfarin (COUMADIN) 5 MG tablet Take as directed by Coumadin clinic  45 tablet  3   No current facility-administered medications for this visit.    Allergies:    Allergies  Allergen Reactions  . Rifampin     REACTION:  rash (unclear if due to vanco or rifampin)  . Vancomycin     REACTION: rash (unclear if due to rifampin or vanco)    Social History:  The patient  reports that he quit smoking about 21 years ago. His smoking use included Cigarettes. He has a 30 pack-year smoking history. He has never used smokeless tobacco. He reports that he drinks alcohol. He reports that he does not use illicit drugs.   Family History:  The patient's family history includes Cancer in his father.   ROS:  Please see the history of present illness.      All other systems reviewed and negative.   PHYSICAL EXAM: VS:  BP 112/80  Pulse 58  Ht 6\' 2"  (1.88 m)  Wt 271 lb (122.925 kg)  BMI 34.78 kg/m2 Well nourished, well developed, in no acute distress HEENT: normal Neck: no JVD Cardiac:  normal S1, S2; irregularly irregular; no murmur Lungs:  clear to auscultation bilaterally, no wheezing, rhonchi or rales Abd: soft, nontender, no hepatomegaly Ext: no edema Skin: warm and dry Neuro:  CNs 2-12 intact, no focal abnormalities  noted  ASSESSMENT AND PLAN:  1. OSA on BiPAP ASV - I will change him to a Resmed Airfit F10 mask to see if his snoring decreases and the air leak resolves.  I suspect his mustache is interfering with a good seal on the mask.  I will get a /l in 4 weeks after starting with the new mask 2. Obesity- his exercise is limited by his leg pain 3. HTN well controlled - continue Coreg/enalapril 4.  Chronic atrial fibrillation rate controlled - continue Coreg 5.  Type II DM - followup with PCP  Followup with me in 6 months  Signed, Fransico Him, MD 12/19/2013 8:23 AM

## 2013-12-22 ENCOUNTER — Ambulatory Visit (INDEPENDENT_AMBULATORY_CARE_PROVIDER_SITE_OTHER): Payer: Medicare Other | Admitting: *Deleted

## 2013-12-22 DIAGNOSIS — I359 Nonrheumatic aortic valve disorder, unspecified: Secondary | ICD-10-CM | POA: Diagnosis not present

## 2013-12-22 DIAGNOSIS — Z954 Presence of other heart-valve replacement: Secondary | ICD-10-CM | POA: Diagnosis not present

## 2013-12-22 DIAGNOSIS — Z5181 Encounter for therapeutic drug level monitoring: Secondary | ICD-10-CM | POA: Diagnosis not present

## 2013-12-22 LAB — POCT INR: INR: 3.3

## 2014-01-12 ENCOUNTER — Ambulatory Visit (INDEPENDENT_AMBULATORY_CARE_PROVIDER_SITE_OTHER): Payer: Medicare Other | Admitting: *Deleted

## 2014-01-12 DIAGNOSIS — I4891 Unspecified atrial fibrillation: Secondary | ICD-10-CM | POA: Diagnosis not present

## 2014-01-12 DIAGNOSIS — I359 Nonrheumatic aortic valve disorder, unspecified: Secondary | ICD-10-CM

## 2014-01-12 DIAGNOSIS — Z5181 Encounter for therapeutic drug level monitoring: Secondary | ICD-10-CM

## 2014-01-12 LAB — POCT INR: INR: 3.3

## 2014-01-13 ENCOUNTER — Telehealth: Payer: Self-pay | Admitting: Cardiology

## 2014-01-13 NOTE — Telephone Encounter (Signed)
New message      Pt still have not gotten his  CPAP mask.

## 2014-01-13 NOTE — Telephone Encounter (Signed)
Called [employer] they stated Insurance would not cover another mask until November 2015. I asked them to call pt and make them aware and get the mask to pt as soon as they are eligible for the new mask. They stated they would call pt and make aware and make sure mask is ordered for pt. They will send Download 4 weeks later.

## 2014-01-19 ENCOUNTER — Ambulatory Visit (HOSPITAL_COMMUNITY): Payer: Medicare Other | Attending: Cardiology | Admitting: Radiology

## 2014-01-19 DIAGNOSIS — I10 Essential (primary) hypertension: Secondary | ICD-10-CM | POA: Insufficient documentation

## 2014-01-19 DIAGNOSIS — I482 Chronic atrial fibrillation: Secondary | ICD-10-CM | POA: Diagnosis not present

## 2014-01-19 DIAGNOSIS — E119 Type 2 diabetes mellitus without complications: Secondary | ICD-10-CM | POA: Insufficient documentation

## 2014-01-19 DIAGNOSIS — I359 Nonrheumatic aortic valve disorder, unspecified: Secondary | ICD-10-CM | POA: Diagnosis not present

## 2014-01-19 DIAGNOSIS — I4891 Unspecified atrial fibrillation: Secondary | ICD-10-CM

## 2014-01-19 DIAGNOSIS — Z87891 Personal history of nicotine dependence: Secondary | ICD-10-CM | POA: Diagnosis not present

## 2014-01-19 DIAGNOSIS — E669 Obesity, unspecified: Secondary | ICD-10-CM | POA: Diagnosis not present

## 2014-01-19 NOTE — Progress Notes (Signed)
Echocardiogram performed.  

## 2014-01-23 ENCOUNTER — Other Ambulatory Visit: Payer: Self-pay

## 2014-01-23 MED ORDER — CARVEDILOL 25 MG PO TABS: 25.0000 mg | ORAL_TABLET | Freq: Two times a day (BID) | ORAL | Status: DC

## 2014-02-09 ENCOUNTER — Ambulatory Visit (INDEPENDENT_AMBULATORY_CARE_PROVIDER_SITE_OTHER): Payer: Medicare Other | Admitting: *Deleted

## 2014-02-09 ENCOUNTER — Other Ambulatory Visit (INDEPENDENT_AMBULATORY_CARE_PROVIDER_SITE_OTHER): Payer: Medicare Other

## 2014-02-09 DIAGNOSIS — I359 Nonrheumatic aortic valve disorder, unspecified: Secondary | ICD-10-CM

## 2014-02-09 DIAGNOSIS — I4891 Unspecified atrial fibrillation: Secondary | ICD-10-CM

## 2014-02-09 DIAGNOSIS — E119 Type 2 diabetes mellitus without complications: Secondary | ICD-10-CM | POA: Diagnosis not present

## 2014-02-09 DIAGNOSIS — Z5181 Encounter for therapeutic drug level monitoring: Secondary | ICD-10-CM

## 2014-02-09 LAB — BASIC METABOLIC PANEL
BUN: 24 mg/dL — ABNORMAL HIGH (ref 6–23)
CO2: 26 mEq/L (ref 19–32)
Calcium: 9.1 mg/dL (ref 8.4–10.5)
Chloride: 105 mEq/L (ref 96–112)
Creatinine, Ser: 1 mg/dL (ref 0.4–1.5)
GFR: 81.74 mL/min (ref >60.00)
Glucose, Bld: 110 mg/dL — ABNORMAL HIGH (ref 70–99)
Potassium: 4.7 mEq/L (ref 3.5–5.1)
SODIUM: 138 meq/L (ref 135–145)

## 2014-02-09 LAB — URINALYSIS, ROUTINE W REFLEX MICROSCOPIC
Bilirubin Urine: NEGATIVE
Ketones, ur: NEGATIVE
LEUKOCYTES UA: NEGATIVE
Nitrite: NEGATIVE
SPECIFIC GRAVITY, URINE: 1.025 (ref 1.000–1.030)
Total Protein, Urine: 30 — AB
URINE GLUCOSE: NEGATIVE
UROBILINOGEN UA: 0.2 (ref 0.0–1.0)
pH: 5.5 (ref 5.0–8.0)

## 2014-02-09 LAB — HEMOGLOBIN A1C: Hgb A1c MFr Bld: 5.4 % (ref 4.6–6.5)

## 2014-02-09 LAB — POCT INR: INR: 2.1

## 2014-02-13 ENCOUNTER — Ambulatory Visit (INDEPENDENT_AMBULATORY_CARE_PROVIDER_SITE_OTHER): Payer: Medicare Other | Admitting: Endocrinology

## 2014-02-13 ENCOUNTER — Encounter: Payer: Self-pay | Admitting: Endocrinology

## 2014-02-13 VITALS — BP 125/68 | HR 73 | Temp 98.3°F | Resp 16 | Ht 74.0 in | Wt 272.2 lb

## 2014-02-13 DIAGNOSIS — E785 Hyperlipidemia, unspecified: Secondary | ICD-10-CM

## 2014-02-13 DIAGNOSIS — E119 Type 2 diabetes mellitus without complications: Secondary | ICD-10-CM | POA: Diagnosis not present

## 2014-02-13 DIAGNOSIS — I2510 Atherosclerotic heart disease of native coronary artery without angina pectoris: Secondary | ICD-10-CM | POA: Diagnosis not present

## 2014-02-13 DIAGNOSIS — Z23 Encounter for immunization: Secondary | ICD-10-CM | POA: Diagnosis not present

## 2014-02-13 NOTE — Progress Notes (Signed)
Patient ID: [name], male   DOB: [birth year], 78 y.o.   MRN: [id number]   Reason for Appointment: Diabetes follow-up   History of Present Illness   Diagnosis: Type 2 DIABETES MELITUS date of onset: 07/2010       Oral hypoglycemic drugs: metformin 2250 mg daily       Side effects from medications: None He was started on metformin when his baseline A1c was 6.9% and he had significant obesity He was also sent for diabetes education. Since then A1c had been consistently upper normal He still has difficulty losing weight especially with limited exercise Although he has less back pain and sciatica he has not been motivated to exercise or walk Glucose is again well controlled and he has done some readings at home after dinner Fasting glucose 110 in the lab   Monitors blood glucose:  0.3 times a day   Results from download: Lunchtime 112, 110 7-10 PM = 100-116 Hypoglycemia frequency: Never.           Meals: 3 meals per day. breakfast:occasional cereal otherwise muffin and peanut butter; occasionally sweets Physical activity: exercise: walking occasionally only           Dietician visit: [facility] 123XX123          Complications: are: none Last eye exam: annual    Wt Readings from Last 3 Encounters:  02/13/14 272 lb 3.2 oz (123.469 kg)  12/19/13 271 lb (122.925 kg)  12/10/13 269 lb (122.018 kg)   Lab Results  Component Value Date   HGBA1C 5.4 02/09/2014   HGBA1C 5.2 08/11/2013   HGBA1C 5.7 05/02/2013   Lab Results  Component Value Date   MICROALBUR 4.1* 05/02/2013   LDLCALC 58 08/11/2013   CREATININE 1.0 02/09/2014    LABS:  Appointment on 02/09/2014  Component Date Value Ref Range Status  . Sodium 02/09/2014 138  135 - 145 mEq/L Final  . Potassium 02/09/2014 4.7  3.5 - 5.1 mEq/L Final  . Chloride 02/09/2014 105  96 - 112 mEq/L Final  . CO2 02/09/2014 26  19 - 32 mEq/L Final  . Glucose, Bld 02/09/2014 110* 70 - 99 mg/dL Final  . BUN 02/09/2014 24* 6 - 23 mg/dL Final   . Creatinine, Ser 02/09/2014 1.0  0.4 - 1.5 mg/dL Final  . Calcium 02/09/2014 9.1  8.4 - 10.5 mg/dL Final  . GFR 02/09/2014 81.74  >60.00 mL/min Final  . Color, Urine 02/09/2014 YELLOW  Yellow;Lt. Yellow Final  . APPearance 02/09/2014 CLEAR  Clear Final  . Specific Gravity, Urine 02/09/2014 1.025  1.000-1.030 Final  . pH 02/09/2014 5.5  5.0 - 8.0 Final  . Total Protein, Urine 02/09/2014 30* Negative Final  . Urine Glucose 02/09/2014 NEGATIVE  Negative Final  . Ketones, ur 02/09/2014 NEGATIVE  Negative Final  . Bilirubin Urine 02/09/2014 NEGATIVE  Negative Final  . Hgb urine dipstick 02/09/2014 SMALL* Negative Final  . Urobilinogen, UA 02/09/2014 0.2  0.0 - 1.0 Final  . Leukocytes, UA 02/09/2014 NEGATIVE  Negative Final  . Nitrite 02/09/2014 NEGATIVE  Negative Final  . WBC, UA 02/09/2014 0-2/hpf  0-2/hpf Final  . RBC / HPF 02/09/2014 0-2/hpf  0-2/hpf Final  . Squamous Epithelial / LPF 02/09/2014 Rare(0-4/hpf)  Rare(0-4/hpf) Final  . Hgb A1c MFr Bld 02/09/2014 5.4  4.6 - 6.5 % Final   Glycemic Control Guidelines for People with Diabetes:Non Diabetic:  <6%Goal of Therapy: <7%Additional Action Suggested:  >8%   Anti-coag visit on 02/09/2014  Component Date Value Ref  Range Status  . INR 02/09/2014 2.1   Final      Medication List       This list is accurate as of: 02/13/14 10:10 AM.  Always use your most recent med list.               albuterol (2.5 MG/3ML) 0.083% nebulizer solution  Commonly known as:  PROVENTIL  Take 2.5 mg by nebulization every 6 (six) hours as needed.     carvedilol 25 MG tablet  Commonly known as:  COREG  Take 1 tablet (25 mg total) by mouth 2 (two) times daily with a meal.     enalapril 20 MG tablet  Commonly known as:  VASOTEC  Take 1 tablet (20 mg total) by mouth daily.     ezetimibe-simvastatin 10-20 MG per tablet  Commonly known as:  VYTORIN  Take 1 tablet by mouth at bedtime.     fluocinonide cream 0.05 %  Commonly known as:  LIDEX  Apply  topically 2 (two) times daily.     furosemide 40 MG tablet  Commonly known as:  LASIX  Take 40 mg by mouth as needed.     glucose blood test strip  Commonly known as:  ONE TOUCH ULTRA TEST  USE AS DIRECTED TO TEST BLOOD GLUCOSE  EVERY OTHER DAY DX: 250.00     metFORMIN 750 MG 24 hr tablet  Commonly known as:  GLUCOPHAGE-XR  Take 3 tablets daily     NON FORMULARY  BIPAP     NON FORMULARY  ONE TOUCH ULTRA TEST STRIPS  AND LANCETS     ONETOUCH DELICA LANCETS FINE Misc  Use to obtain a blood specimen every other day Dx code 250.00     potassium chloride 10 MEQ tablet  Commonly known as:  KLOR-CON M10  Take 1 tablet (10 mEq total) by mouth once.     vitamin B-12 1000 MCG tablet  Commonly known as:  CYANOCOBALAMIN  Take 1,000 mcg by mouth daily.     warfarin 5 MG tablet  Commonly known as:  COUMADIN  Take as directed by Coumadin clinic        Allergies:  Allergies  Allergen Reactions  . Rifampin     REACTION: rash (unclear if due to vanco or rifampin)  . Vancomycin     REACTION: rash (unclear if due to rifampin or vanco)    Past Medical History  Diagnosis Date  . Hx of CABG   . Aortic stenosis   . Chronic atrial fibrillation   . CHF with unknown LVEF     47 %  . Subclavian bypass stenosis   . Rheumatic fever   . Vitamin B12 deficiency   . OSA (obstructive sleep apnea)   . Hypertension   . Carotid artery disease 1994    s/p left carotid endarerectomy   . Left ventricular dysfunction 2007    LVEF 47%     Past Surgical History  Procedure Laterality Date  . Heart bypass    . Cardiac valve surgery  1994    replaced due to aortic stenosis, [employer] Jude mechanical prostesis  . Coronary artery bypass graft  1994    w SVG to RCA and SVG to circumflex    Family History  Problem Relation Age of Onset  . Cancer Father     lung     Social History:  reports that he quit smoking about 21 years ago. His smoking use included Cigarettes. He has a  30 pack-year  smoking history. He has never used smokeless tobacco. He reports that he drinks alcohol. He reports that he does not use illicit drugs.  Review of Systems - Cardiovascular ROS: positive for  coronary artery disease  HYPERTENSION:  he is on multiple drugs with good control and normal renal function  HYPERLIPIDEMIA:         The lipid abnormality consists of elevated LDL treated with Vytorin.    Lab Results  Component Value Date   CHOL 118 08/11/2013   HDL 48.60 08/11/2013   LDLCALC 58 08/11/2013   TRIG 58.0 08/11/2013   CHOLHDL 2 08/11/2013      Examination:   BP 125/68 mmHg  Pulse 73  Temp(Src) 98.3 F (36.8 C)  Resp 16  Ht 6\' 2"  (1.88 m)  Wt 272 lb 3.2 oz (123.469 kg)  BMI 34.93 kg/m2  SpO2 95%  Body mass index is 34.93 kg/(m^2).   No pedal edema  Assesment/PLAN:   1. Diabetes type 2   The patient's diabetes is overall well controlled with normal A1c again He has done some walking but encouraged him to do this more regularly He will continue on maximum dose metformin for now since he is tolerating this well and call if blood sugars higher Discussed mild increase in fasting glucose resulting from hepatic glucose production and need to keep his weight down  2. Hyperlipidemia: Well controlled as of 5/15  Raseel Jans 02/13/2014, 10:10 AM

## 2014-02-13 NOTE — Patient Instructions (Signed)
Walk daily  Please check blood sugars about 2 hours after any meal and 2 times per week on waking up.  Please bring blood sugar monitor to each visit

## 2014-02-23 ENCOUNTER — Ambulatory Visit (INDEPENDENT_AMBULATORY_CARE_PROVIDER_SITE_OTHER): Payer: Medicare Other | Admitting: *Deleted

## 2014-02-23 DIAGNOSIS — I4891 Unspecified atrial fibrillation: Secondary | ICD-10-CM | POA: Diagnosis not present

## 2014-02-23 DIAGNOSIS — Z5181 Encounter for therapeutic drug level monitoring: Secondary | ICD-10-CM | POA: Diagnosis not present

## 2014-02-23 DIAGNOSIS — I359 Nonrheumatic aortic valve disorder, unspecified: Secondary | ICD-10-CM

## 2014-02-23 LAB — POCT INR: INR: 2.9

## 2014-03-16 ENCOUNTER — Other Ambulatory Visit: Payer: Self-pay | Admitting: Endocrinology

## 2014-03-16 ENCOUNTER — Ambulatory Visit (INDEPENDENT_AMBULATORY_CARE_PROVIDER_SITE_OTHER): Payer: Medicare Other | Admitting: Pharmacist

## 2014-03-16 DIAGNOSIS — Z5181 Encounter for therapeutic drug level monitoring: Secondary | ICD-10-CM | POA: Diagnosis not present

## 2014-03-16 DIAGNOSIS — I359 Nonrheumatic aortic valve disorder, unspecified: Secondary | ICD-10-CM

## 2014-03-16 DIAGNOSIS — I4891 Unspecified atrial fibrillation: Secondary | ICD-10-CM

## 2014-03-16 LAB — POCT INR: INR: 3

## 2014-04-13 ENCOUNTER — Ambulatory Visit (INDEPENDENT_AMBULATORY_CARE_PROVIDER_SITE_OTHER): Payer: Medicare Other | Admitting: Surgery

## 2014-04-13 DIAGNOSIS — I359 Nonrheumatic aortic valve disorder, unspecified: Secondary | ICD-10-CM | POA: Diagnosis not present

## 2014-04-13 DIAGNOSIS — I4891 Unspecified atrial fibrillation: Secondary | ICD-10-CM | POA: Diagnosis not present

## 2014-04-13 DIAGNOSIS — Z5181 Encounter for therapeutic drug level monitoring: Secondary | ICD-10-CM | POA: Diagnosis not present

## 2014-04-13 LAB — POCT INR: INR: 3

## 2014-04-23 ENCOUNTER — Other Ambulatory Visit: Payer: Self-pay | Admitting: Interventional Cardiology

## 2014-04-29 ENCOUNTER — Other Ambulatory Visit: Payer: Self-pay | Admitting: Interventional Cardiology

## 2014-05-25 ENCOUNTER — Ambulatory Visit (INDEPENDENT_AMBULATORY_CARE_PROVIDER_SITE_OTHER): Payer: Medicare Other | Admitting: *Deleted

## 2014-05-25 DIAGNOSIS — I4891 Unspecified atrial fibrillation: Secondary | ICD-10-CM

## 2014-05-25 DIAGNOSIS — I359 Nonrheumatic aortic valve disorder, unspecified: Secondary | ICD-10-CM

## 2014-05-25 DIAGNOSIS — Z5181 Encounter for therapeutic drug level monitoring: Secondary | ICD-10-CM

## 2014-05-25 LAB — POCT INR: INR: 1.9

## 2014-05-28 ENCOUNTER — Telehealth: Payer: Self-pay

## 2014-05-28 ENCOUNTER — Other Ambulatory Visit: Payer: Self-pay | Admitting: *Deleted

## 2014-05-28 MED ORDER — WARFARIN SODIUM 5 MG PO TABS: ORAL_TABLET | ORAL | Status: DC

## 2014-05-28 NOTE — Telephone Encounter (Signed)
Called and verified with pharmacy that pt does have refills left and does not require anymore refills at this time. (KLOR-CON 10 MEQ)

## 2014-06-15 ENCOUNTER — Other Ambulatory Visit: Payer: Self-pay | Admitting: Interventional Cardiology

## 2014-06-16 NOTE — Progress Notes (Signed)
Cardiology Office Note   Date:  06/17/2014   ID:  [name], DOB 26-Oct-1935, MRN [id number]  PCP:  Antony Blackbird, MD  Cardiologist:   Daneen Schick, MD   Chief Complaint  Patient presents with  . Sleep Apnea  . Hypertension  . Obesity      History of Present Illness: [name] is a 79 y.o. male with a history of OSA, obesity and HTN who presents today for followup. He is doing well. He tolerates his BiPAP without any problems. He feels rested in the am and has no daytime sleepiness. He tolerates the nasal mask without any problems but he still snores some according to his wife. He tried the chin strap but says it does not help. He has tried the full face mask in the past but did not tolerate it.  He says that his nasal mask is leaking some.     Past Medical History  Diagnosis Date  . Hx of CABG   . Aortic stenosis   . Chronic atrial fibrillation   . CHF with unknown LVEF     47 %  . Subclavian bypass stenosis   . Rheumatic fever   . Vitamin B12 deficiency   . OSA (obstructive sleep apnea)   . Hypertension   . Carotid artery disease 1994    s/p left carotid endarerectomy   . Left ventricular dysfunction 2007    LVEF 47%     Past Surgical History  Procedure Laterality Date  . Heart bypass    . Cardiac valve surgery  1994    replaced due to aortic stenosis, [employer]. Jude mechanical prostesis  . Coronary artery bypass graft  1994    w SVG to RCA and SVG to circumflex     Current Outpatient Prescriptions  Medication Sig Dispense Refill  . carvedilol (COREG) 25 MG tablet Take 1 tablet (25 mg total) by mouth 2 (two) times daily with a meal. 180 tablet 1  . enalapril (VASOTEC) 20 MG tablet TAKE 1 TABLET BY MOUTH EVERY DAY 90 tablet 1  . ezetimibe-simvastatin (VYTORIN) 10-20 MG per tablet Take 1 tablet by mouth at bedtime. 90 tablet 2  . fluocinonide cream (LIDEX) 0.05 % Apply topically 2 (two) times daily.    . furosemide (LASIX) 40 MG tablet Take 40 mg by mouth as  needed.     Marland Kitchen glucose blood (ONE TOUCH ULTRA TEST) test strip USE AS DIRECTED TO TEST BLOOD GLUCOSE  EVERY OTHER DAY DX: 250.00 50 each 2  . KLOR-CON M10 10 MEQ tablet TAKE 1 TABLET EVERY DAY 30 tablet 5  . metFORMIN (GLUCOPHAGE-XR) 750 MG 24 hr tablet TAKE 3 TABLETS BY MOUTH EVERY DAY 270 tablet 1  . NON FORMULARY BIPAP    . NON FORMULARY ONE TOUCH ULTRA TEST STRIPS  AND LANCETS    . ONETOUCH DELICA LANCETS FINE MISC Use to obtain a blood specimen every other day Dx code 250.00 100 each 1  . PROAIR HFA 108 (90 BASE) MCG/ACT inhaler Inhale 2 puffs into the lungs as needed. For chest congestion    . vitamin B-12 (CYANOCOBALAMIN) 1000 MCG tablet Take 1,000 mcg by mouth daily.    Marland Kitchen warfarin (COUMADIN) 5 MG tablet TAKE AS DIRECTED BY COUMADIN CLINIC 45 tablet 3   No current facility-administered medications for this visit.    Allergies:   Rifampin and Vancomycin    Social History:  The patient  reports that he quit smoking about 22 years ago.  His smoking use included Cigarettes. He has a 30 pack-year smoking history. He has never used smokeless tobacco. He reports that he drinks alcohol. He reports that he does not use illicit drugs.   Family History:  The patient's family history includes Cancer in his father.    ROS:  Please see the history of present illness.   Otherwise, review of systems are positive for none.   All other systems are reviewed and negative.    PHYSICAL EXAM: VS:  BP 136/70 mmHg  Pulse 82  Ht 6' 1.5" (1.867 m)  Wt 268 lb 12.8 oz (121.927 kg)  BMI 34.98 kg/m2  SpO2 95% , BMI Body mass index is 34.98 kg/(m^2). GEN: Well nourished, well developed, in no acute distress HEENT: normal Neck: no JVD, carotid bruits, or masses Cardiac: irregularly irregular; no murmurs, rubs, or gallops.  Bilateral edema R>L Respiratory:  clear to auscultation bilaterally, normal work of breathing GI: soft, nontender, nondistended, + BS MS: no deformity or atrophy Skin: warm and dry, no  rash Neuro:  Strength and sensation are intact Psych: euthymic mood, full affect   EKG:  EKG is not ordered today.     Recent Labs: 08/11/2013: ALT 12 02/09/2014: BUN 24*; Creatinine 1.0; Potassium 4.7; Sodium 138    Lipid Panel    Component Value Date/Time   CHOL 118 08/11/2013 0841   TRIG 58.0 08/11/2013 0841   HDL 48.60 08/11/2013 0841   CHOLHDL 2 08/11/2013 0841   VLDL 11.6 08/11/2013 0841   LDLCALC 58 08/11/2013 0841      Wt Readings from Last 3 Encounters:  06/17/14 268 lb 12.8 oz (121.927 kg)  02/13/14 272 lb 3.2 oz (123.469 kg)  12/19/13 271 lb (122.925 kg)     ASSESSMENT AND PLAN:  1. OSA on BiPAP ASV.  His d/l today showed an AHI 7.1/hr on 16/12cm H2O.  He has a significant mask leak as well.  He is 100% compliant in using more than 4 hours nightly.  I have recommended that he take his mask to [facility] to evaluate the fit as well as get a new chin strap.  I suspect the nasal mask leak and breathing through his mouth are leading to the elevated AHI.   2. Obesity- I have encouraged him to try to get into a routine exercise program 3. HTN well controlled - continue Coreg/enalapril 5. Type II DM - followup with PCP    Current medicines are reviewed at length with the patient today.  The patient does not have concerns regarding medicines.  The following changes have been made:  no change  Labs/ tests ordered today include: None   Orders Placed This Encounter  Procedures  . For home use only DME continuous positive airway pressure (CPAP)     Disposition:   FU with me in 6 months   Signed, Sueanne Margarita, MD  06/17/2014 10:44 AM    [facility] [address], [place], [place]  [address] Phone: [phone]; Fax: [phone]

## 2014-06-17 ENCOUNTER — Encounter: Payer: Self-pay | Admitting: Cardiology

## 2014-06-17 ENCOUNTER — Ambulatory Visit (INDEPENDENT_AMBULATORY_CARE_PROVIDER_SITE_OTHER): Payer: Medicare Other | Admitting: Cardiology

## 2014-06-17 ENCOUNTER — Ambulatory Visit (INDEPENDENT_AMBULATORY_CARE_PROVIDER_SITE_OTHER): Payer: Medicare Other | Admitting: *Deleted

## 2014-06-17 VITALS — BP 136/70 | HR 82 | Ht 73.5 in | Wt 268.8 lb

## 2014-06-17 DIAGNOSIS — G4733 Obstructive sleep apnea (adult) (pediatric): Secondary | ICD-10-CM | POA: Diagnosis not present

## 2014-06-17 DIAGNOSIS — I482 Chronic atrial fibrillation, unspecified: Secondary | ICD-10-CM

## 2014-06-17 DIAGNOSIS — E669 Obesity, unspecified: Secondary | ICD-10-CM | POA: Diagnosis not present

## 2014-06-17 DIAGNOSIS — I10 Essential (primary) hypertension: Secondary | ICD-10-CM | POA: Diagnosis not present

## 2014-06-17 DIAGNOSIS — I359 Nonrheumatic aortic valve disorder, unspecified: Secondary | ICD-10-CM | POA: Diagnosis not present

## 2014-06-17 DIAGNOSIS — Z5181 Encounter for therapeutic drug level monitoring: Secondary | ICD-10-CM

## 2014-06-17 LAB — POCT INR: INR: 2

## 2014-06-17 NOTE — Patient Instructions (Addendum)
You will be contacted by [address] to check your mask for leaks and to get you a chin strap.   Your physician wants you to follow-up in: 6 months with Dr. Radford Pax. You will receive a reminder letter in the mail two months in advance. If you don't receive a letter, please call our office to schedule the follow-up appointment.

## 2014-06-25 ENCOUNTER — Encounter: Payer: Self-pay | Admitting: Cardiology

## 2014-07-01 ENCOUNTER — Ambulatory Visit (INDEPENDENT_AMBULATORY_CARE_PROVIDER_SITE_OTHER): Payer: Medicare Other | Admitting: *Deleted

## 2014-07-01 DIAGNOSIS — I359 Nonrheumatic aortic valve disorder, unspecified: Secondary | ICD-10-CM | POA: Diagnosis not present

## 2014-07-01 DIAGNOSIS — I482 Chronic atrial fibrillation, unspecified: Secondary | ICD-10-CM

## 2014-07-01 DIAGNOSIS — Z5181 Encounter for therapeutic drug level monitoring: Secondary | ICD-10-CM | POA: Diagnosis not present

## 2014-07-01 LAB — POCT INR: INR: 2.6

## 2014-07-22 ENCOUNTER — Ambulatory Visit (INDEPENDENT_AMBULATORY_CARE_PROVIDER_SITE_OTHER): Payer: Medicare Other | Admitting: *Deleted

## 2014-07-22 DIAGNOSIS — I482 Chronic atrial fibrillation, unspecified: Secondary | ICD-10-CM

## 2014-07-22 DIAGNOSIS — I359 Nonrheumatic aortic valve disorder, unspecified: Secondary | ICD-10-CM | POA: Diagnosis not present

## 2014-07-22 DIAGNOSIS — Z5181 Encounter for therapeutic drug level monitoring: Secondary | ICD-10-CM | POA: Diagnosis not present

## 2014-07-22 LAB — POCT INR: INR: 3.3

## 2014-08-11 ENCOUNTER — Other Ambulatory Visit (INDEPENDENT_AMBULATORY_CARE_PROVIDER_SITE_OTHER): Payer: Medicare Other

## 2014-08-11 DIAGNOSIS — E119 Type 2 diabetes mellitus without complications: Secondary | ICD-10-CM | POA: Diagnosis not present

## 2014-08-11 LAB — COMPREHENSIVE METABOLIC PANEL
ALT: 12 U/L (ref 0–53)
AST: 19 U/L (ref 0–37)
Albumin: 3.9 g/dL (ref 3.5–5.2)
Alkaline Phosphatase: 87 U/L (ref 39–117)
BILIRUBIN TOTAL: 1.2 mg/dL (ref 0.2–1.2)
BUN: 23 mg/dL (ref 6–23)
CALCIUM: 9.4 mg/dL (ref 8.4–10.5)
CO2: 27 meq/L (ref 19–32)
Chloride: 105 mEq/L (ref 96–112)
Creatinine, Ser: 0.94 mg/dL (ref 0.40–1.50)
GFR: 82.64 mL/min (ref >60.00)
GLUCOSE: 103 mg/dL — AB (ref 70–99)
Potassium: 4.8 mEq/L (ref 3.5–5.1)
Sodium: 138 mEq/L (ref 135–145)
Total Protein: 6.9 g/dL (ref 6.0–8.3)

## 2014-08-11 LAB — HEMOGLOBIN A1C: Hgb A1c MFr Bld: 5.4 % (ref 4.6–6.5)

## 2014-08-14 ENCOUNTER — Other Ambulatory Visit: Payer: Self-pay | Admitting: *Deleted

## 2014-08-14 ENCOUNTER — Ambulatory Visit (INDEPENDENT_AMBULATORY_CARE_PROVIDER_SITE_OTHER): Payer: Medicare Other | Admitting: Endocrinology

## 2014-08-14 VITALS — BP 118/70 | HR 84 | Ht 73.0 in | Wt 270.2 lb

## 2014-08-14 DIAGNOSIS — E119 Type 2 diabetes mellitus without complications: Secondary | ICD-10-CM

## 2014-08-14 DIAGNOSIS — E118 Type 2 diabetes mellitus with unspecified complications: Secondary | ICD-10-CM

## 2014-08-14 LAB — POCT URINALYSIS DIPSTICK
Bilirubin, UA: NEGATIVE
Glucose, UA: NEGATIVE
Ketones, UA: NEGATIVE
LEUKOCYTES UA: NEGATIVE
NITRITE UA: NEGATIVE
SPEC GRAV UA: 1.015
Urobilinogen, UA: 0.2
pH, UA: 6

## 2014-08-14 LAB — MICROALBUMIN / CREATININE URINE RATIO
Creatinine,U: 80.6 mg/dL
MICROALB UR: 21.1 mg/dL — AB (ref 0.0–1.9)
MICROALB/CREAT RATIO: 26.2 mg/g (ref 0.0–30.0)

## 2014-08-14 NOTE — Patient Instructions (Addendum)
Get eye exam   Check blood sugars on waking up ..  .. times a week Also check blood sugars about 2 hours after a meal and do this after different meals by rotation Recommended blood sugar levels on waking up is 90-130 and about 2 hours after meal is 120-150 Please bring blood sugar monitor to each visit.

## 2014-08-14 NOTE — Progress Notes (Signed)
Patient ID: [name], male   DOB: 08-06-1935, 79 y.o.   MRN: [id number]   Reason for Appointment: Diabetes follow-up   History of Present Illness   Diagnosis: Type 2 DIABETES MELITUS date of onset: 07/2010       Oral hypoglycemic drugs: metformin 2250 mg daily       Side effects from medications: None  He was started on metformin when his baseline A1c was 6.9% and he had significant obesity He was also sent for diabetes education. Since then A1c had been consistently upper normal  Recent history: His A1c again is excellent at 5.4% Has not checked many readings at home, only a few in the evenings Although he has not had as much back pain is still not walking much and has not made any efforts to lose weight Tolerating high dose metformin currently  Fasting glucose in the lab is 103   Monitors blood glucose:  infrequently Most of his glucose readings are after supper ranging from 109-128 and a morning reading of 128; has only 5 readings in the last month        Meals: breakfast:occasional cereal otherwise muffin and peanut butter; occasionally sweets for snacks Physical activity: exercise: walking occasionally only           Dietician visit: [facility] [id number]          Complications: are: none Last eye exam: annual    Wt Readings from Last 3 Encounters:  08/14/14 270 lb 3.2 oz (122.562 kg)  06/17/14 268 lb 12.8 oz (121.927 kg)  02/13/14 272 lb 3.2 oz (123.469 kg)   Lab Results  Component Value Date   HGBA1C 5.4 08/11/2014   HGBA1C 5.4 02/09/2014   HGBA1C 5.2 08/11/2013   Lab Results  Component Value Date   MICROALBUR 4.1* 05/02/2013   LDLCALC 58 08/11/2013   CREATININE 0.94 08/11/2014    LABS:  Lab on 08/11/2014  Component Date Value Ref Range Status  . Hgb A1c MFr Bld 08/11/2014 5.4  4.6 - 6.5 % Final   Glycemic Control Guidelines for People with Diabetes:Non Diabetic:  <6%Goal of Therapy: <7%Additional Action Suggested:  >8%   . Sodium 08/11/2014 138  135 - 145  mEq/L Final  . Potassium 08/11/2014 4.8  3.5 - 5.1 mEq/L Final  . Chloride 08/11/2014 105  96 - 112 mEq/L Final  . CO2 08/11/2014 27  19 - 32 mEq/L Final  . Glucose, Bld 08/11/2014 103* 70 - 99 mg/dL Final  . BUN 08/11/2014 23  6 - 23 mg/dL Final  . Creatinine, Ser 08/11/2014 0.94  0.40 - 1.50 mg/dL Final  . Total Bilirubin 08/11/2014 1.2  0.2 - 1.2 mg/dL Final  . Alkaline Phosphatase 08/11/2014 87  39 - 117 U/L Final  . AST 08/11/2014 19  0 - 37 U/L Final  . ALT 08/11/2014 12  0 - 53 U/L Final  . Total Protein 08/11/2014 6.9  6.0 - 8.3 g/dL Final  . Albumin 08/11/2014 3.9  3.5 - 5.2 g/dL Final  . Calcium 08/11/2014 9.4  8.4 - 10.5 mg/dL Final  . GFR 08/11/2014 82.64  >60.00 mL/min Final      Medication List       This list is accurate as of: 08/14/14 10:14 AM.  Always use your most recent med list.               carvedilol 25 MG tablet  Commonly known as:  COREG  Take 1 tablet (25 mg total) by mouth  2 (two) times daily with a meal.     enalapril 20 MG tablet  Commonly known as:  VASOTEC  TAKE 1 TABLET BY MOUTH EVERY DAY     ezetimibe-simvastatin 10-20 MG per tablet  Commonly known as:  VYTORIN  Take 1 tablet by mouth at bedtime.     fluocinonide cream 0.05 %  Commonly known as:  LIDEX  Apply topically 2 (two) times daily.     furosemide 40 MG tablet  Commonly known as:  LASIX  Take 40 mg by mouth as needed.     glucose blood test strip  Commonly known as:  ONE TOUCH ULTRA TEST  USE AS DIRECTED TO TEST BLOOD GLUCOSE  EVERY OTHER DAY DX: 250.00     KLOR-CON M10 10 MEQ tablet  Generic drug:  potassium chloride  TAKE 1 TABLET EVERY DAY     metFORMIN 750 MG 24 hr tablet  Commonly known as:  GLUCOPHAGE-XR  TAKE 3 TABLETS BY MOUTH EVERY DAY     NON FORMULARY  BIPAP     NON FORMULARY  ONE TOUCH ULTRA TEST STRIPS  AND LANCETS     ONETOUCH DELICA LANCETS FINE Misc  Use to obtain a blood specimen every other day Dx code 250.00     PROAIR HFA 108 (90 BASE)  MCG/ACT inhaler  Generic drug:  albuterol  Inhale 2 puffs into the lungs as needed. For chest congestion     vitamin B-12 1000 MCG tablet  Commonly known as:  CYANOCOBALAMIN  Take 1,000 mcg by mouth daily.     warfarin 5 MG tablet  Commonly known as:  COUMADIN  TAKE AS DIRECTED BY COUMADIN CLINIC        Allergies:  Allergies  Allergen Reactions  . Rifampin     REACTION: rash (unclear if due to vanco or rifampin)  . Vancomycin     REACTION: rash (unclear if due to rifampin or vanco)    Past Medical History  Diagnosis Date  . Hx of CABG   . Aortic stenosis   . Chronic atrial fibrillation   . CHF with unknown LVEF     47 %  . Subclavian bypass stenosis   . Rheumatic fever   . Vitamin B12 deficiency   . OSA (obstructive sleep apnea)   . Hypertension   . Carotid artery disease 1994    s/p left carotid endarerectomy   . Left ventricular dysfunction 2007    LVEF 47%     Past Surgical History  Procedure Laterality Date  . Heart bypass    . Cardiac valve surgery  1994    replaced due to aortic stenosis, St. Jude mechanical prostesis  . Coronary artery bypass graft  1994    w SVG to RCA and SVG to circumflex    Family History  Problem Relation Age of Onset  . Cancer Father     lung     Social History:  reports that he quit smoking about 22 years ago. His smoking use included Cigarettes. He has a 30 pack-year smoking history. He has never used smokeless tobacco. He reports that he drinks alcohol. He reports that he does not use illicit drugs.  Review of Systems   No recent problems with coronary artery disease  HYPERTENSION:  he is on multiple drugs with good control and normal renal function  HYPERLIPIDEMIA:         The lipid abnormality consists of elevated LDL treated with Vytorin and followed elsewhere .  Lab Results  Component Value Date   CHOL 118 08/11/2013   HDL 48.60 08/11/2013   LDLCALC 58 08/11/2013   TRIG 58.0 08/11/2013   CHOLHDL 2  08/11/2013      Examination:   BP 118/70 mmHg  Pulse 84  Ht 6\' 1"  (1.854 m)  Wt 270 lb 3.2 oz (122.562 kg)  BMI 35.66 kg/m2  SpO2 92%  Body mass index is 35.66 kg/(m^2).   No pedal edema  Assesment/PLAN:   1. Diabetes type 2   The patient's diabetes is overall well controlled with normal A1c again of 5.4% He has done some walking but can do better because of difficulty with losing weight; does not have as much back pain recently He does check his blood sugar only sporadically and most of the readings are normal  He will continue on maximum dose metformin for now since he is tolerating this well and call if blood sugars higher  2.  Hypertension: Well controlled and he has no proteinuria, however needs follow-up microalbumin today  Adelle Zachar 08/14/2014, 10:14 AM

## 2014-08-16 ENCOUNTER — Encounter: Payer: Self-pay | Admitting: Endocrinology

## 2014-08-19 ENCOUNTER — Ambulatory Visit (INDEPENDENT_AMBULATORY_CARE_PROVIDER_SITE_OTHER): Payer: Medicare Other | Admitting: *Deleted

## 2014-08-19 DIAGNOSIS — I359 Nonrheumatic aortic valve disorder, unspecified: Secondary | ICD-10-CM | POA: Diagnosis not present

## 2014-08-19 DIAGNOSIS — I482 Chronic atrial fibrillation, unspecified: Secondary | ICD-10-CM

## 2014-08-19 DIAGNOSIS — Z5181 Encounter for therapeutic drug level monitoring: Secondary | ICD-10-CM | POA: Diagnosis not present

## 2014-08-19 LAB — POCT INR: INR: 2.4

## 2014-09-04 ENCOUNTER — Ambulatory Visit (INDEPENDENT_AMBULATORY_CARE_PROVIDER_SITE_OTHER): Payer: Medicare Other | Admitting: *Deleted

## 2014-09-04 DIAGNOSIS — I482 Chronic atrial fibrillation, unspecified: Secondary | ICD-10-CM

## 2014-09-04 DIAGNOSIS — I359 Nonrheumatic aortic valve disorder, unspecified: Secondary | ICD-10-CM | POA: Diagnosis not present

## 2014-09-04 DIAGNOSIS — Z5181 Encounter for therapeutic drug level monitoring: Secondary | ICD-10-CM | POA: Diagnosis not present

## 2014-09-04 LAB — POCT INR: INR: 3

## 2014-09-14 ENCOUNTER — Other Ambulatory Visit: Payer: Self-pay | Admitting: Endocrinology

## 2014-09-21 ENCOUNTER — Other Ambulatory Visit: Payer: Self-pay | Admitting: Endocrinology

## 2014-09-24 ENCOUNTER — Encounter: Payer: Self-pay | Admitting: Cardiology

## 2014-09-25 ENCOUNTER — Ambulatory Visit (INDEPENDENT_AMBULATORY_CARE_PROVIDER_SITE_OTHER): Payer: Medicare Other | Admitting: *Deleted

## 2014-09-25 ENCOUNTER — Other Ambulatory Visit: Payer: Self-pay | Admitting: *Deleted

## 2014-09-25 DIAGNOSIS — I359 Nonrheumatic aortic valve disorder, unspecified: Secondary | ICD-10-CM | POA: Diagnosis not present

## 2014-09-25 DIAGNOSIS — I482 Chronic atrial fibrillation, unspecified: Secondary | ICD-10-CM

## 2014-09-25 DIAGNOSIS — Z5181 Encounter for therapeutic drug level monitoring: Secondary | ICD-10-CM | POA: Diagnosis not present

## 2014-09-25 LAB — POCT INR: INR: 3.5

## 2014-09-25 MED ORDER — WARFARIN SODIUM 5 MG PO TABS: ORAL_TABLET | ORAL | Status: DC

## 2014-09-25 MED ORDER — CARVEDILOL 25 MG PO TABS: 25.0000 mg | ORAL_TABLET | Freq: Two times a day (BID) | ORAL | Status: DC

## 2014-09-29 DIAGNOSIS — R609 Edema, unspecified: Secondary | ICD-10-CM | POA: Diagnosis not present

## 2014-09-29 DIAGNOSIS — Z125 Encounter for screening for malignant neoplasm of prostate: Secondary | ICD-10-CM | POA: Diagnosis not present

## 2014-09-29 DIAGNOSIS — E785 Hyperlipidemia, unspecified: Secondary | ICD-10-CM | POA: Diagnosis not present

## 2014-09-29 DIAGNOSIS — I739 Peripheral vascular disease, unspecified: Secondary | ICD-10-CM | POA: Diagnosis not present

## 2014-09-29 DIAGNOSIS — I10 Essential (primary) hypertension: Secondary | ICD-10-CM | POA: Diagnosis not present

## 2014-09-29 DIAGNOSIS — Z79899 Other long term (current) drug therapy: Secondary | ICD-10-CM | POA: Diagnosis not present

## 2014-09-29 DIAGNOSIS — Z7901 Long term (current) use of anticoagulants: Secondary | ICD-10-CM | POA: Diagnosis not present

## 2014-09-29 DIAGNOSIS — I482 Chronic atrial fibrillation: Secondary | ICD-10-CM | POA: Diagnosis not present

## 2014-09-29 DIAGNOSIS — G4733 Obstructive sleep apnea (adult) (pediatric): Secondary | ICD-10-CM | POA: Diagnosis not present

## 2014-09-29 DIAGNOSIS — E538 Deficiency of other specified B group vitamins: Secondary | ICD-10-CM | POA: Diagnosis not present

## 2014-09-29 DIAGNOSIS — Z952 Presence of prosthetic heart valve: Secondary | ICD-10-CM | POA: Diagnosis not present

## 2014-09-29 DIAGNOSIS — I2510 Atherosclerotic heart disease of native coronary artery without angina pectoris: Secondary | ICD-10-CM | POA: Diagnosis not present

## 2014-10-07 ENCOUNTER — Other Ambulatory Visit: Payer: Self-pay

## 2014-10-07 DIAGNOSIS — I739 Peripheral vascular disease, unspecified: Secondary | ICD-10-CM

## 2014-10-14 ENCOUNTER — Telehealth: Payer: Self-pay | Admitting: Hematology

## 2014-10-14 NOTE — Telephone Encounter (Signed)
NEW PATIENT APPT-S/W PATIENT WIFE GLORIA AND GAVE NP APPT FOR 07/19 @ 10:30 W/DR. KALE REFERRING DR. CAMMIE FULP DX- DEC'D PLTS  INFORMATION SCANNED INTO [facility]

## 2014-10-19 ENCOUNTER — Encounter: Payer: Self-pay | Admitting: Vascular Surgery

## 2014-10-20 ENCOUNTER — Encounter: Payer: Self-pay | Admitting: Hematology

## 2014-10-20 ENCOUNTER — Telehealth: Payer: Self-pay | Admitting: Hematology

## 2014-10-20 ENCOUNTER — Ambulatory Visit (HOSPITAL_BASED_OUTPATIENT_CLINIC_OR_DEPARTMENT_OTHER): Payer: Medicare Other

## 2014-10-20 ENCOUNTER — Ambulatory Visit (HOSPITAL_BASED_OUTPATIENT_CLINIC_OR_DEPARTMENT_OTHER): Payer: Medicare Other | Admitting: Hematology

## 2014-10-20 ENCOUNTER — Ambulatory Visit: Payer: Medicare Other

## 2014-10-20 VITALS — BP 131/89 | HR 86 | Temp 97.9°F | Resp 18 | Ht 73.0 in | Wt 269.0 lb

## 2014-10-20 DIAGNOSIS — D696 Thrombocytopenia, unspecified: Secondary | ICD-10-CM

## 2014-10-20 DIAGNOSIS — D7589 Other specified diseases of blood and blood-forming organs: Secondary | ICD-10-CM | POA: Insufficient documentation

## 2014-10-20 LAB — CBC WITH DIFFERENTIAL/PLATELET
BASO%: 0.3 % (ref 0.0–2.0)
BASOS ABS: 0 10*3/uL (ref 0.0–0.1)
EOS ABS: 0.1 10*3/uL (ref 0.0–0.5)
EOS%: 1.3 % (ref 0.0–7.0)
HCT: 40.7 % (ref 38.4–49.9)
HGB: 13.4 g/dL (ref 13.0–17.1)
LYMPH#: 0.8 10*3/uL — AB (ref 0.9–3.3)
LYMPH%: 20.3 % (ref 14.0–49.0)
MCH: 35.9 pg — ABNORMAL HIGH (ref 27.2–33.4)
MCHC: 32.9 g/dL (ref 32.0–36.0)
MCV: 109.1 fL — ABNORMAL HIGH (ref 79.3–98.0)
MONO#: 0.4 10*3/uL (ref 0.1–0.9)
MONO%: 10.8 % (ref 0.0–14.0)
NEUT%: 67.3 % (ref 39.0–75.0)
NEUTROS ABS: 2.6 10*3/uL (ref 1.5–6.5)
RBC: 3.73 10*6/uL — AB (ref 4.20–5.82)
RDW: 16.1 % — ABNORMAL HIGH (ref 11.0–14.6)
WBC: 3.8 10*3/uL — AB (ref 4.0–10.3)

## 2014-10-20 LAB — COMPREHENSIVE METABOLIC PANEL (CC13)
ALT: 15 U/L (ref 0–55)
AST: 21 U/L (ref 5–34)
Albumin: 3.8 g/dL (ref 3.5–5.0)
Alkaline Phosphatase: 95 U/L (ref 40–150)
Anion Gap: 9 mEq/L (ref 3–11)
BILIRUBIN TOTAL: 1.1 mg/dL (ref 0.20–1.20)
BUN: 28.7 mg/dL — AB (ref 7.0–26.0)
CALCIUM: 9 mg/dL (ref 8.4–10.4)
CO2: 21 meq/L — AB (ref 22–29)
Chloride: 110 mEq/L — ABNORMAL HIGH (ref 98–109)
Creatinine: 1.1 mg/dL (ref 0.7–1.3)
EGFR: 63 mL/min/{1.73_m2} — ABNORMAL LOW (ref >90)
Glucose: 95 mg/dl (ref 70–140)
POTASSIUM: 4.5 meq/L (ref 3.5–5.1)
Sodium: 140 mEq/L (ref 136–145)
Total Protein: 6.9 g/dL (ref 6.4–8.3)

## 2014-10-20 LAB — TECHNOLOGIST REVIEW

## 2014-10-20 LAB — CHCC SMEAR

## 2014-10-20 NOTE — Progress Notes (Signed)
. [facility] CONSULT NOTE  Patient Care Team: Antony Blackbird, MD as PCP - General (Family Medicine) Brunetta Genera, MD as Consulting Physician (Hematology)  CHIEF COMPLAINTS/PURPOSE OF CONSULTATION:  "Low Platelet Counts"  HISTORY OF PRESENTING ILLNESS:   [name] pleasant 79 year old Caucasian male with a history of coronary disease status post CABG in 1994, aortic valve replacement for aortic stenosis with mechanical valve in 1994 on chronic Coumadin therapy, carotid artery stenosis status post subclavian bypass, obstructive sleep apnea, hypertension has been referred to [place] in consultation for evaluation and management of thrombocytopenia by his primary care physician Dr. Chapman Fitch.  He has been known to have mild thrombocytopenia in the past with labs in our system from 2010 showing platelets in the range of 109k to 186,000. He also had a couple of readings of platelets in the 61,000 range which were noted to have obvious platelet clumping. Patient reports that he was told that his platelet counts a couple weeks ago with his primary care physician were 73,000. We do not have access to these laboratory values at this time.  He reports no overt spontaneous bleeding. Did notice bruising over his upper and lower extremities from a recent fall from a stepping stool after bumping into a door frame. Has been stably on Coumadin from 1994 without any major bleeding.  No overt GI bleeding, black stools, hematuria, epistaxis. He denies having a history of liver disease. Does consume about 4-6 ounces of wine daily.  No new medications in the last several months. No recent symptoms of viral upper respiratory tract infection or stomach flu. No recent infections or antibiotic use. No focal neurological deficits no altered mental status. He denies prior platelet transfusions  No fevers /chills/night sweats, new lumps or bumps, new skin rashes, new arthralgias. Has reduced about 30  pounds over the last 1 year from dietary changes and portion control. Notes that he hasn't been taking his Lasix regularly over the last month or 2 and has had some increased leg swelling.  MEDICAL HISTORY:  Past Medical History  Diagnosis Date  . Hx of CABG   . Aortic stenosis   . Chronic atrial fibrillation   . CHF with unknown LVEF     47 %  . Subclavian bypass stenosis   . Rheumatic fever   . Vitamin B12 deficiency   . OSA (obstructive sleep apnea)   . Hypertension   . Carotid artery disease 1994    s/p left carotid endarerectomy   . Left ventricular dysfunction 2007    LVEF 47%     SURGICAL HISTORY: Past Surgical History  Procedure Laterality Date  . Heart bypass    . Cardiac valve surgery  1994    replaced due to aortic stenosis, [employer] mechanical prostesis  . Coronary artery bypass graft  1994    w SVG to RCA and SVG to circumflex    SOCIAL HISTORY: History   Social History  . Marital Status: Married    Spouse Name: N/A  . Number of Children: N/A  . Years of Education: N/A   Occupational History  . Not on file.   Social History Main Topics  . Smoking status: Former Smoker -- 1.00 packs/day for 30 years    Types: Cigarettes    Quit date: 04/03/1992  . Smokeless tobacco: Never Used  . Alcohol Use: Yes     Comment: 4-6 ounces of wine daily  . Drug Use: No  . Sexual Activity: Not on  file   Other Topics Concern  . Not on file   Social History Narrative    FAMILY HISTORY: Family History  Problem Relation Age of Onset  . Cancer Father     lung     ALLERGIES:  is allergic to rifampin and vancomycin.  MEDICATIONS:  Current Outpatient Prescriptions  Medication Sig Dispense Refill  . carvedilol (COREG) 25 MG tablet Take 1 tablet (25 mg total) by mouth 2 (two) times daily with a meal. 180 tablet 0  . enalapril (VASOTEC) 20 MG tablet TAKE 1 TABLET BY MOUTH EVERY DAY 90 tablet 1  . ezetimibe-simvastatin (VYTORIN) 10-20 MG per tablet Take 1 tablet  by mouth at bedtime. 90 tablet 2  . fluocinonide cream (LIDEX) 0.05 % Apply topically 2 (two) times daily.    . furosemide (LASIX) 40 MG tablet Take 40 mg by mouth as needed.     Marland Kitchen glucose blood (ONE TOUCH ULTRA TEST) test strip USE AS DIRECTED TO TEST BLOOD GLUCOSE  EVERY OTHER DAY DX: 250.00 50 each 2  . KLOR-CON M10 10 MEQ tablet TAKE 1 TABLET EVERY DAY 30 tablet 5  . metFORMIN (GLUCOPHAGE-XR) 750 MG 24 hr tablet TAKE 3 TABLETS BY MOUTH EVERY DAY 270 tablet 1  . NON FORMULARY BIPAP    . NON FORMULARY ONE TOUCH ULTRA TEST STRIPS  AND LANCETS    . ONETOUCH DELICA LANCETS FINE MISC Use to obtain a blood specimen every other day Dx code 250.00 100 each 1  . PROAIR HFA 108 (90 BASE) MCG/ACT inhaler Inhale 2 puffs into the lungs as needed. For chest congestion    . vitamin B-12 (CYANOCOBALAMIN) 1000 MCG tablet Take 1,000 mcg by mouth daily.    Marland Kitchen warfarin (COUMADIN) 5 MG tablet TAKE AS DIRECTED BY COUMADIN CLINIC 120 tablet 1   No current facility-administered medications for this visit.    REVIEW OF SYSTEMS:   Constitutional: Denies fevers, chills or abnormal night sweats Eyes: Denies blurriness of vision, double vision or watery eyes Ears, nose, mouth, throat, and face: Denies mucositis or sore throat Respiratory: Denies cough, dyspnea or wheezes Cardiovascular: Denies palpitation, chest discomfort or lower extremity swelling Gastrointestinal:  Denies nausea, heartburn or change in bowel habits Skin: Denies abnormal skin rashes Lymphatics: Denies new lymphadenopathy or easy bruising Neurological:Denies numbness, tingling or new weaknesses Behavioral/Psych: Mood is stable, no new changes  All other systems were reviewed with the patient and are negative.  PHYSICAL EXAMINATION: ECOG PERFORMANCE STATUS: 1 - Symptomatic but completely ambulatory  Filed Vitals:   10/20/14 1107  BP: 131/89  Pulse: 86  Temp: 97.9 F (36.6 C)  Resp: 18   Filed Weights   10/20/14 1107  Weight: 269 lb  (122.018 kg)    GENERAL:alert, no distress and comfortable SKIN: skin color, texture, turgor are normal, no rashes or significant lesions some traumatic bruising noted on his upper and lower extremity from recent fall.No petechiae .  EYES: normal, conjunctiva are pink and non-injected, sclera clear OROPHARYNX:no exudate, no erythema and lips, buccal mucosa, and tongue normal  NECK: supple, thyroid normal size, non-tender, without nodularity LYMPH:  no palpable lymphadenopathy in the cervical, axillary or inguinal LUNGS: clear to auscultation and percussion with normal breathing effort HEART: regular rate & rhythm and no murmurs and no lower extremity edema.clear click of his metallic aortic valve noted.  ABDOMEN:abdomen soft, non-tender and normal bowel sounds Musculoskeletal:no cyanosis of digits and no clubbing  PSYCH: alert & oriented x 3 with fluent speech NEURO:  no focal motor/sensory deficits  LABORATORY DATA:  I have reviewed the data as listed Recent Results (from the past 2160 hour(s))  Hemoglobin A1c     Status: None   Collection Time: 08/11/14  9:04 AM  Result Value Ref Range   Hgb A1c MFr Bld 5.4 4.6 - 6.5 %    Comment: Glycemic Control Guidelines for People with Diabetes:Non Diabetic:  <6%Goal of Therapy: <7%Additional Action Suggested:  >8%   Comprehensive metabolic panel     Status: Abnormal   Collection Time: 08/11/14  9:04 AM  Result Value Ref Range   Sodium 138 135 - 145 mEq/L   Potassium 4.8 3.5 - 5.1 mEq/L   Chloride 105 96 - 112 mEq/L   CO2 27 19 - 32 mEq/L   Glucose, Bld 103 (H) 70 - 99 mg/dL   BUN 23 6 - 23 mg/dL   Creatinine, Ser 0.94 0.40 - 1.50 mg/dL   Total Bilirubin 1.2 0.2 - 1.2 mg/dL   Alkaline Phosphatase 87 39 - 117 U/L   AST 19 0 - 37 U/L   ALT 12 0 - 53 U/L   Total Protein 6.9 6.0 - 8.3 g/dL   Albumin 3.9 3.5 - 5.2 g/dL   Calcium 9.4 8.4 - 10.5 mg/dL   GFR 82.64 >60.00 mL/min  Microalbumin / creatinine urine ratio     Status: Abnormal    Collection Time: 08/14/14 10:40 AM  Result Value Ref Range   Microalb, Ur 21.1 (H) 0.0 - 1.9 mg/dL   Creatinine,U 80.6 mg/dL   Microalb Creat Ratio 26.2 0.0 - 30.0 mg/g  POCT urinalysis dipstick     Status: Abnormal   Collection Time: 08/14/14 10:51 AM  Result Value Ref Range   Color, UA Dark Yellow    Clarity, UA Cloudy    Glucose, UA Neg    Bilirubin, UA Neg    Ketones, UA Neg    Spec Grav, UA 1.015    Blood, UA Mod    pH, UA 6.0    Protein, UA 30+    Urobilinogen, UA 0.2    Nitrite, UA Neg    Leukocytes, UA Negative   POCT INR     Status: None   Collection Time: 08/19/14  8:59 AM  Result Value Ref Range   INR 2.4   POCT INR     Status: None   Collection Time: 09/04/14  8:47 AM  Result Value Ref Range   INR 3.0   POCT INR     Status: None   Collection Time: 09/25/14  8:44 AM  Result Value Ref Range   INR 3.5   CBC with Differential/Platelet     Status: Abnormal   Collection Time: 10/20/14 12:28 PM  Result Value Ref Range   WBC 3.8 (L) 4.0 - 10.3 10e3/uL   NEUT# 2.6 1.5 - 6.5 10e3/uL   HGB 13.4 13.0 - 17.1 g/dL   HCT 40.7 38.4 - 49.9 %   Platelets 65 Platelet count consistent in citrate (L) 140 - 400 10e3/uL   MCV 109.1 (H) 79.3 - 98.0 fL   MCH 35.9 (H) 27.2 - 33.4 pg   MCHC 32.9 32.0 - 36.0 g/dL   RBC 3.73 (L) 4.20 - 5.82 10e6/uL   RDW 16.1 (H) 11.0 - 14.6 %   lymph# 0.8 (L) 0.9 - 3.3 10e3/uL   MONO# 0.4 0.1 - 0.9 10e3/uL   Eosinophils Absolute 0.1 0.0 - 0.5 10e3/uL   Basophils Absolute 0.0 0.0 - 0.1 10e3/uL  NEUT% 67.3 39.0 - 75.0 %   LYMPH% 20.3 14.0 - 49.0 %   MONO% 10.8 0.0 - 14.0 %   EOS% 1.3 0.0 - 7.0 %   BASO% 0.3 0.0 - 2.0 %  TECHNOLOGIST REVIEW     Status: None   Collection Time: 10/20/14 12:28 PM  Result Value Ref Range   Technologist Review Platelet agrees with Slide Est   Comprehensive metabolic panel     Status: Abnormal   Collection Time: 10/20/14 12:29 PM  Result Value Ref Range   Sodium 140 136 - 145 mEq/L   Potassium 4.5 3.5 - 5.1  mEq/L   Chloride 110 (H) 98 - 109 mEq/L   CO2 21 (L) 22 - 29 mEq/L   Glucose 95 70 - 140 mg/dl   BUN 28.7 (H) 7.0 - 26.0 mg/dL   Creatinine 1.1 0.7 - 1.3 mg/dL   Total Bilirubin 1.10 0.20 - 1.20 mg/dL   Alkaline Phosphatase 95 40 - 150 U/L   AST 21 5 - 34 U/L   ALT 15 0 - 55 U/L   Total Protein 6.9 6.4 - 8.3 g/dL   Albumin 3.8 3.5 - 5.0 g/dL   Calcium 9.0 8.4 - 10.4 mg/dL   Anion Gap 9 3 - 11 mEq/L   EGFR 63 (L) >90 ml/min/1.73 m2    Comment: eGFR is calculated using the CKD-EPI Creatinine Equation (2009)  Smear     Status: None   Collection Time: 10/20/14 12:29 PM  Result Value Ref Range   Smear Result Smear Available     RADIOGRAPHIC STUDIES: I have personally reviewed the radiological images as listed and agreed with the findings in the report. No results found.  ASSESSMENT & PLAN Thrombocytopenia Patient has moderate thrombocytopenia. His platelet counts today are 65k which was confirmed with measurement in citrate anticoagulant to rule out pseudothrombocytopenia due to EDTA. I personally reviewed his peripheral blood smear and saw no evidence of platelet clumping or platelet satellitism. He has previously had mild thrombocytopenia as per labs in 2010. An immature platelet fraction could not be done today to determine if this is peripheral platelet destruction/sequestration or on marrow hypofunctioning. It is quite possible that he has progression of some baseline ITP but other obvious causes need to be ruled out.  The presence of worsening macrocytosis with MCV of 109 is concerning for possible alcohol abuse, developing myelodysplastic syndrome, persistent B12 deficiency or folate deficiency. The presence of dimorphic WBC population on peripheral blood smear is somewhat concerning for the possibility of myelodysplastic syndrome though he is not currently anemic or significantly leukopenic at this time.  Patient has no renal failure, fever, altered mental status, thrombotic  phenomena to suggest TTP/HUS spectrum disorders. He has a few schistocytes on his peripheral blood likely from his mechanical aortic valve but do not seem to be excessive. He has no overt autoimmune clinical features. No obvious new medications. No obvious symptoms suggestive of viral infections. No recent transfusions that might cause alloimmune thrombocytopenia. Plan  -Patient is on chronic Coumadin for mechanical aortic valve which lends urgency to the patient's workup since platelets below 22,633 might complicate his anticoagulant management. -No overt clinical bleeding at this time. -I have counseled the patient on absolute alcohol cessation -Patient is currently on oral vitamin B12 for vitamin B12 deficiency. We will recheck his vitamin B12 level, RBC folate and serum folate levels. -Reticulocyte count, LDH, haptoglobin -viral w/u with hepatitis profile -Ultrasound abdomen to check for chronic liver disease or splenomegaly. -  Avoid NSAIDS and other medications known to cause thrombocytopenia especially ranitidine and other H2 blockers, sulfa drugs, PPIs. -Would consider his vitamin B12 to 2000 g sublingually daily since ongoing metformin therapy is a limiting factor in absorbing vitamin B12 and is an active risk factor for patients to develop vitamin B12 once they're on treatment for 3-5 years. Would have low threshold of treating with parenteral B12 if B12 levels remain low.  -The all about workup is negative and an increased IPF suggests peripheral distraction might consider trial of steroids as treatment of possible ITP. -If significantly worsening platelet counts might need to consider a bone marrow biopsy if above workup unrevealing. -Patient counseled to seek immediate attention if significant bleeding noted   Macrocytosis without anemia Possibilities include early myelodysplastic syndrome, alcohol abuse, macrocytic anemia related to C95 or folic acid deficiency. His peripheral blood  smear showed a dimorphic RBC population more suggestive of MDS or vitB12/folate deficiency as opposed to alcohol abuse or thyroid problems. LDH levels typically tend to be elevated with megaloblastic anemia with B12/folate deficiency as compared to MDS as a result of ineffective erythropoiesis. PLAN Appropriate labs ordered will follow-up the patient in 2 week.   Patient to follow up for repeat labs in 1 week Return to clinic in 2 weeks with above noted lab results and ultrasound of the abdomen   I appreciate the privilege of taking part in the care of this wonderful gentleman.  Total time spent 60 minutes more than 50% of the time in direct patient care counseling and coordination of care.  Brunetta Genera MD 10/20/2014

## 2014-10-20 NOTE — Assessment & Plan Note (Addendum)
Patient has moderate thrombocytopenia. His platelet counts today are 65k which was confirmed with measurement in citrate anticoagulant to rule out pseudothrombocytopenia due to EDTA. I personally reviewed his peripheral blood smear and saw no evidence of platelet clumping or platelet satellitism. He has previously had mild thrombocytopenia as per labs in 2010. An immature platelet fraction could not be done today to determine if this is peripheral platelet destruction/sequestration or on marrow hypofunctioning. It is quite possible that he has progression of some baseline ITP but other obvious causes need to be ruled out.  The presence of worsening macrocytosis with MCV of 109 is concerning for possible alcohol abuse, developing myelodysplastic syndrome, persistent B12 deficiency or folate deficiency. The presence of dimorphic WBC population on peripheral blood smear is somewhat concerning for the possibility of myelodysplastic syndrome though he is not currently anemic or significantly leukopenic at this time.  Patient has no renal failure, fever, altered mental status, thrombotic phenomena to suggest TTP/HUS spectrum disorders. He has a few schistocytes on his peripheral blood likely from his mechanical aortic valve but do not seem to be excessive. He has no overt autoimmune clinical features. No obvious new medications. No obvious symptoms suggestive of viral infections. No recent transfusions that might cause alloimmune thrombocytopenia. Plan  -Patient is on chronic Coumadin for mechanical aortic valve which lends urgency to the patient's workup since platelets below 92,330 might complicate his anticoagulant management. -No overt clinical bleeding at this time. -I have counseled the patient on absolute alcohol cessation -Patient is currently on oral vitamin B12 for vitamin B12 deficiency. We will recheck his vitamin B12 level, RBC folate and serum folate levels. -Reticulocyte count, LDH,  haptoglobin -viral w/u with hepatitis profile -Ultrasound abdomen to check for chronic liver disease or splenomegaly. -Avoid NSAIDS and other medications known to cause thrombocytopenia especially ranitidine and other H2 blockers, sulfa drugs, PPIs. -Would consider his vitamin B12 to 2000 g sublingually daily since ongoing metformin therapy is a limiting factor in absorbing vitamin B12 and is an active risk factor for patients to develop vitamin B12 once they're on treatment for 3-5 years. Would have low threshold of treating with parenteral B12 if B12 levels remain low.  -The all about workup is negative and an increased IPF suggests peripheral distraction might consider trial of steroids as treatment of possible ITP. -If significantly worsening platelet counts might need to consider a bone marrow biopsy if above workup unrevealing. -Patient counseled to seek immediate attention if significant bleeding noted

## 2014-10-20 NOTE — Assessment & Plan Note (Signed)
Possibilities include early myelodysplastic syndrome, alcohol abuse, macrocytic anemia related to 123456 or folic acid deficiency. His peripheral blood smear showed a dimorphic RBC population more suggestive of MDS or vitB12/folate deficiency as opposed to alcohol abuse or thyroid problems. LDH levels typically tend to be elevated with megaloblastic anemia with B12/folate deficiency as compared to MDS as a result of ineffective erythropoiesis. PLAN Appropriate labs ordered will follow-up the patient in 2 week.

## 2014-10-20 NOTE — Telephone Encounter (Signed)
per pof to sch pt appt-gave pt copy of avs °

## 2014-10-20 NOTE — Progress Notes (Signed)
Checked in new pt with no financial concerns prior to seeing the dr.  [name] has 2 insurances so financial assistance may not be needed but he has my card for any billing questions or concerns.

## 2014-10-21 ENCOUNTER — Telehealth: Payer: Self-pay | Admitting: Hematology

## 2014-10-21 ENCOUNTER — Ambulatory Visit (HOSPITAL_COMMUNITY)
Admission: RE | Admit: 2014-10-21 | Discharge: 2014-10-21 | Disposition: A | Discharge status: Home / Self Care | Payer: Medicare Other | Source: Ambulatory Visit | Attending: Vascular Surgery | Admitting: Vascular Surgery

## 2014-10-21 ENCOUNTER — Ambulatory Visit (INDEPENDENT_AMBULATORY_CARE_PROVIDER_SITE_OTHER): Payer: Medicare Other | Admitting: Vascular Surgery

## 2014-10-21 ENCOUNTER — Encounter: Payer: Self-pay | Admitting: Vascular Surgery

## 2014-10-21 VITALS — BP 132/72 | HR 78 | Temp 97.3°F | Resp 16 | Ht 74.0 in | Wt 266.5 lb

## 2014-10-21 DIAGNOSIS — I70219 Atherosclerosis of native arteries of extremities with intermittent claudication, unspecified extremity: Secondary | ICD-10-CM | POA: Diagnosis not present

## 2014-10-21 DIAGNOSIS — I739 Peripheral vascular disease, unspecified: Secondary | ICD-10-CM | POA: Diagnosis present

## 2014-10-21 NOTE — Telephone Encounter (Signed)
cld & spoke to pt and adv of time & date of lab-pt understood-adv [facility] would call & [facility] [place]

## 2014-10-21 NOTE — Progress Notes (Signed)
Referred by:  Antony Blackbird, MD [address], [place] [address]  Reason for referral: bilateral leg intermittent claudication (R>L)  History of Present Illness  [name] is a 79 y.o. (April 15, 1935) male who presents with chief complaint: B leg pain.  Onset of symptom occurred unknown time period without obvious trigger.  Pain is described as cramping (R>L) in thigh and calf, severity 1-5/10, and associated with ambulation ~0.75 block.  Patient has attempted to treat this pain with rest.  The patient has no rest pain symptoms also and no leg wounds/ulcers.  Atherosclerotic risk factors include: HLD, HTN, and prior smoking.  Past Medical History  Diagnosis Date  . Hx of CABG   . Aortic stenosis   . Chronic atrial fibrillation   . CHF with unknown LVEF     47 %  . Subclavian bypass stenosis   . Rheumatic fever   . Vitamin B12 deficiency   . OSA (obstructive sleep apnea)   . Hypertension   . Carotid artery disease 1994    s/p left carotid endarerectomy   . Left ventricular dysfunction 2007    LVEF 47%     Past Surgical History  Procedure Laterality Date  . Heart bypass      Done in [place]  . Cardiac valve surgery  1994    replaced due to aortic stenosis, [employer] mechanical prostesis  . Coronary artery bypass graft  1994    w SVG to RCA and SVG to circumflex  . Carotid endarterectomy Left 1997    subclavian bypass Done in [place]    History   Social History  . Marital Status: Married    Spouse Name: N/A  . Number of Children: N/A  . Years of Education: N/A   Occupational History  . Not on file.   Social History Main Topics  . Smoking status: Former Smoker -- 1.00 packs/day for 30 years    Types: Cigarettes    Quit date: 04/03/1992  . Smokeless tobacco: Never Used  . Alcohol Use: Yes     Comment: 4-6 ounces of wine daily  . Drug Use: No  . Sexual Activity: Not on file   Other Topics Concern  . Not on file   Social History Narrative     Family History  Problem Relation Age of Onset  . Cancer Father     lung   . Hypertension Mother     Current Outpatient Prescriptions on File Prior to Visit  Medication Sig Dispense Refill  . carvedilol (COREG) 25 MG tablet Take 1 tablet (25 mg total) by mouth 2 (two) times daily with a meal. 180 tablet 0  . enalapril (VASOTEC) 20 MG tablet TAKE 1 TABLET BY MOUTH EVERY DAY 90 tablet 1  . ezetimibe-simvastatin (VYTORIN) 10-20 MG per tablet Take 1 tablet by mouth at bedtime. 90 tablet 2  . fluocinonide cream (LIDEX) 0.05 % Apply topically 2 (two) times daily.    . furosemide (LASIX) 40 MG tablet Take 40 mg by mouth as needed.     Marland Kitchen glucose blood (ONE TOUCH ULTRA TEST) test strip USE AS DIRECTED TO TEST BLOOD GLUCOSE  EVERY OTHER DAY DX: 250.00 50 each 2  . KLOR-CON M10 10 MEQ tablet TAKE 1 TABLET EVERY DAY 30 tablet 5  . metFORMIN (GLUCOPHAGE-XR) 750 MG 24 hr tablet TAKE 3 TABLETS BY MOUTH EVERY DAY 270 tablet 1  . NON FORMULARY BIPAP    . NON FORMULARY ONE TOUCH ULTRA TEST  STRIPS  AND LANCETS    . ONETOUCH DELICA LANCETS FINE MISC Use to obtain a blood specimen every other day Dx code 250.00 100 each 1  . PROAIR HFA 108 (90 BASE) MCG/ACT inhaler Inhale 2 puffs into the lungs as needed. For chest congestion    . vitamin B-12 (CYANOCOBALAMIN) 1000 MCG tablet Take 1,000 mcg by mouth daily.    Marland Kitchen warfarin (COUMADIN) 5 MG tablet TAKE AS DIRECTED BY COUMADIN CLINIC 120 tablet 1   No current facility-administered medications on file prior to visit.    Allergies  Allergen Reactions  . Rifampin     REACTION: rash (unclear if due to vanco or rifampin)  . Vancomycin     REACTION: rash (unclear if due to rifampin or vanco)    REVIEW OF SYSTEMS:  (Positives checked otherwise negative)  CARDIOVASCULAR:  [ ]  chest pain, [ ]  chest pressure, [ ]  palpitations, [ ]  shortness of breath when laying flat, [x]  shortness of breath with exertion,   [ ]  pain in feet when walking, [ ]  pain in feet when  laying flat, [ ]  history of blood clot in veins (DVT), [ ]  history of phlebitis, [x]  swelling in legs, [ ]  varicose veins  PULMONARY:  [ ]  productive cough, [ ]  asthma, [ ]  wheezing  NEUROLOGIC:  [ ]  weakness in arms or legs, [ ]  numbness in arms or legs, [ ]  difficulty speaking or slurred speech, [ ]  temporary loss of vision in one eye, [ ]  dizziness  HEMATOLOGIC:  [ ]  bleeding problems, [ ]  problems with blood clotting too easily  MUSCULOSKEL:  [ ]  joint pain, [ ]  joint swelling  GASTROINTEST:  [ ]   Vomiting blood, [ ]   Blood in stool     GENITOURINARY:  [ ]   Burning with urination, [ ]   Blood in urine  PSYCHIATRIC:  [ ]  history of major depression  INTEGUMENTARY:  [ ]  rashes, [ ]  ulcers  CONSTITUTIONAL:  [ ]  fever, [ ]  chills  For VQI Use Only  PRE-ADM LIVING: Home  AMB STATUS: Ambulatory  CAD Sx: History of MI, but no symptoms No MI within 6 months  PRIOR CHF: None  STRESS TEST: [x]  No, [ ]  Normal, [ ]  + ischemia, [ ]  + MI, [ ]  Both   Physical Examination Filed Vitals:   10/21/14 1505  BP: 132/72  Pulse: 78  Temp: 97.3 F (36.3 C)  TempSrc: Oral  Resp: 16  Height: 6\' 2"  (1.88 m)  Weight: 266 lb 8 oz (120.884 kg)  SpO2: 94%   Body mass index is 34.2 kg/(m^2).  General: A&O x 3, WDWN  Head: [place]/AT  Ear/Nose/Throat: Hearing grossly intact, nares w/o erythema or drainage, oropharynx w/o Erythema/Exudate  Eyes: PERRLA, EOMI  Neck: Supple, no nuchal rigidity, no palpable LAD  Pulmonary: Sym exp, good air movt, CTAB, no rales, rhonchi, & wheezing  Cardiac: RRR, Nl S1, S2, no Murmurs, rubs or gallops, mechanical valve sound  Vascular: Vessel Right Left  Radial Palpable Palpable  Brachial  Palpable Palpable  Carotid Palpable, without bruit Palpable, without bruit  Aorta Not palpable N/A  Femoral Palpable Palpable  Popliteal Not palpable Not palpable  PT Palpable Palpable  DP Palpable Palpable   Gastrointestinal: soft, NTND, -G/R, - HSM, - masses, -  CVAT B  Musculoskeletal: M/S 5/5 throughout , Extremities without ischemic changes , B feet cyanotic, healed GSV harvest incision, R>L edema 1-2+, BLE LDS  Neurologic: CN 2-12 grossly intact , Pain  and light touch intact in extremities , Motor exam as listed above  Psychiatric: Judgment intact, Mood & affect appropriatefor pt's clinical situation  Dermatologic: See M/S exam for extremity exam, no rashes otherwise noted  Lymph : No Cervical, Axillary, or Inguinal lymphadenopathy    Non-Invasive Vascular Imaging  ABI (Date: 10/21/2014)  R: 1.20, DP: tri, PT: tri, TBI: 0.65  L: 1.15, DP: bi, PT: bi, TBI: 0.62   Outside Studies/Documentation 3 pages of outside documents were reviewed including: outpatient PCP records.  Medical Decision Making  [name] is a 79 y.o. male who presents with: R>L intermittent claudication, minimal BLE PAD   The patient's ABI are suggestive of minimal PAD but sx are consistent with intermittent claudication.  I would get an exercise ABI to look for any underlying iliac artery disease, which can result in normal ABI at rest but sx with exertion.  I discussed with the patient the natural history of intermittent claudication: 75% of patients have stable or improved symptoms in a year an only 2% require amputation. Eventually 20% may require intervention in a year.  I discussed in depth with the patient the nature of atherosclerosis, and emphasized the importance of maximal medical management including strict control of blood pressure, blood glucose, and lipid levels, antiplatelet agent, obtaining regular exercise, and cessation of smoking.    The patient is aware that without maximal medical management the underlying atherosclerotic disease process will progress, limiting the benefit of any interventions.  I discussed in depth with the patient a walking plan and how to execute such. The patient is currently on a statin: Vytorin. The patient is  currently not on an anti-platelet: due to bleeding risks on Warfarin. He is on warfarin for his mechanical AVR.  The patient will follow up in 2-4 weeks with the above study.  Thank you for allowing [place] to participate in this patient's care.  Adele Barthel, MD [facility] Office: [phone] Pager: [phone]  10/21/2014, 3:24 PM

## 2014-10-21 NOTE — Telephone Encounter (Signed)
Called [name] to discuss this test results and his findings of thrombocytopenia with a platelet count of 65,000 discussed possible etiologies and the need for additional labs and ultrasound of the abdomen about a week Before his next clinic visit in 2 weeks. Answered all his questions to his apparent satisfaction.

## 2014-10-23 ENCOUNTER — Ambulatory Visit (INDEPENDENT_AMBULATORY_CARE_PROVIDER_SITE_OTHER): Payer: Medicare Other | Admitting: *Deleted

## 2014-10-23 DIAGNOSIS — I482 Chronic atrial fibrillation, unspecified: Secondary | ICD-10-CM

## 2014-10-23 DIAGNOSIS — Z5181 Encounter for therapeutic drug level monitoring: Secondary | ICD-10-CM | POA: Diagnosis not present

## 2014-10-23 DIAGNOSIS — I359 Nonrheumatic aortic valve disorder, unspecified: Secondary | ICD-10-CM

## 2014-10-23 LAB — POCT INR: INR: 4.5

## 2014-10-26 ENCOUNTER — Other Ambulatory Visit (HOSPITAL_BASED_OUTPATIENT_CLINIC_OR_DEPARTMENT_OTHER): Payer: Medicare Other

## 2014-10-26 ENCOUNTER — Ambulatory Visit (HOSPITAL_COMMUNITY)
Admission: RE | Admit: 2014-10-26 | Discharge: 2014-10-26 | Disposition: A | Discharge status: Home / Self Care | Payer: Medicare Other | Source: Ambulatory Visit | Attending: Hematology | Admitting: Hematology

## 2014-10-26 DIAGNOSIS — D7589 Other specified diseases of blood and blood-forming organs: Secondary | ICD-10-CM | POA: Diagnosis not present

## 2014-10-26 DIAGNOSIS — R161 Splenomegaly, not elsewhere classified: Secondary | ICD-10-CM | POA: Diagnosis not present

## 2014-10-26 DIAGNOSIS — D759 Disease of blood and blood-forming organs, unspecified: Secondary | ICD-10-CM | POA: Diagnosis present

## 2014-10-26 DIAGNOSIS — D696 Thrombocytopenia, unspecified: Secondary | ICD-10-CM

## 2014-10-26 DIAGNOSIS — D539 Nutritional anemia, unspecified: Secondary | ICD-10-CM | POA: Diagnosis not present

## 2014-10-26 DIAGNOSIS — E538 Deficiency of other specified B group vitamins: Secondary | ICD-10-CM

## 2014-10-26 DIAGNOSIS — D892 Hypergammaglobulinemia, unspecified: Secondary | ICD-10-CM | POA: Diagnosis not present

## 2014-10-26 LAB — CBC & DIFF AND RETIC
BASO%: 0.6 % (ref 0.0–2.0)
Basophils Absolute: 0 10*3/uL (ref 0.0–0.1)
EOS%: 1.9 % (ref 0.0–7.0)
Eosinophils Absolute: 0.1 10*3/uL (ref 0.0–0.5)
HEMATOCRIT: 41.2 % (ref 38.4–49.9)
HGB: 13.4 g/dL (ref 13.0–17.1)
IMMATURE RETIC FRACT: 17.3 % — AB (ref 3.00–10.60)
LYMPH#: 0.7 10*3/uL — AB (ref 0.9–3.3)
LYMPH%: 22.2 % (ref 14.0–49.0)
MCH: 35.5 pg — AB (ref 27.2–33.4)
MCHC: 32.5 g/dL (ref 32.0–36.0)
MCV: 109.3 fL — ABNORMAL HIGH (ref 79.3–98.0)
MONO#: 0.2 10*3/uL (ref 0.1–0.9)
MONO%: 6.2 % (ref 0.0–14.0)
NEUT%: 69.1 % (ref 39.0–75.0)
NEUTROS ABS: 2.2 10*3/uL (ref 1.5–6.5)
Platelets: 61 10*3/uL — ABNORMAL LOW (ref 140–400)
RBC: 3.77 10*6/uL — AB (ref 4.20–5.82)
RDW: 16 % — AB (ref 11.0–14.6)
Retic %: 2.26 % — ABNORMAL HIGH (ref 0.80–1.80)
Retic Ct Abs: 85.2 10*3/uL (ref 34.80–93.90)
WBC: 3.2 10*3/uL — AB (ref 4.0–10.3)

## 2014-10-26 LAB — LACTATE DEHYDROGENASE (CC13): LDH: 260 U/L — AB (ref 125–245)

## 2014-10-27 ENCOUNTER — Other Ambulatory Visit: Payer: Medicare Other

## 2014-10-27 ENCOUNTER — Other Ambulatory Visit: Payer: Self-pay | Admitting: Hematology

## 2014-10-27 ENCOUNTER — Other Ambulatory Visit: Payer: Self-pay | Admitting: Interventional Cardiology

## 2014-10-27 DIAGNOSIS — E538 Deficiency of other specified B group vitamins: Secondary | ICD-10-CM

## 2014-10-27 DIAGNOSIS — D539 Nutritional anemia, unspecified: Secondary | ICD-10-CM

## 2014-10-27 LAB — VITAMIN B12: VITAMIN B 12: 393 pg/mL (ref 211–911)

## 2014-10-28 ENCOUNTER — Other Ambulatory Visit: Payer: Self-pay

## 2014-10-28 LAB — HEPATITIS C ANTIBODY: HCV Ab: NEGATIVE

## 2014-10-28 LAB — HEPATITIS B SURFACE ANTIGEN: Hepatitis B Surface Ag: NEGATIVE

## 2014-10-28 LAB — HAPTOGLOBIN

## 2014-10-28 LAB — FOLATE: FOLATE: 5.1 ng/mL

## 2014-10-28 LAB — HEPATITIS B CORE ANTIBODY, TOTAL: Hep B Core Total Ab: NONREACTIVE

## 2014-10-28 MED ORDER — POTASSIUM CHLORIDE CRYS ER 10 MEQ PO TBCR: 10.0000 meq | EXTENDED_RELEASE_TABLET | Freq: Every day | ORAL | Status: DC

## 2014-11-03 ENCOUNTER — Encounter: Payer: Self-pay | Admitting: Hematology

## 2014-11-03 ENCOUNTER — Ambulatory Visit (HOSPITAL_BASED_OUTPATIENT_CLINIC_OR_DEPARTMENT_OTHER): Payer: Medicare Other

## 2014-11-03 ENCOUNTER — Telehealth: Payer: Self-pay | Admitting: Hematology

## 2014-11-03 ENCOUNTER — Ambulatory Visit (HOSPITAL_BASED_OUTPATIENT_CLINIC_OR_DEPARTMENT_OTHER): Payer: Medicare Other | Admitting: Hematology

## 2014-11-03 VITALS — BP 123/63 | HR 65 | Temp 98.2°F | Resp 18 | Ht 74.0 in | Wt 266.4 lb

## 2014-11-03 DIAGNOSIS — D696 Thrombocytopenia, unspecified: Secondary | ICD-10-CM

## 2014-11-03 DIAGNOSIS — D892 Hypergammaglobulinemia, unspecified: Secondary | ICD-10-CM | POA: Diagnosis not present

## 2014-11-03 DIAGNOSIS — D7589 Other specified diseases of blood and blood-forming organs: Secondary | ICD-10-CM

## 2014-11-03 DIAGNOSIS — D474 Osteomyelofibrosis: Secondary | ICD-10-CM | POA: Diagnosis not present

## 2014-11-03 LAB — COMPREHENSIVE METABOLIC PANEL (CC13)
ALK PHOS: 100 U/L (ref 40–150)
ALT: 13 U/L (ref 0–55)
ANION GAP: 7 meq/L (ref 3–11)
AST: 21 U/L (ref 5–34)
Albumin: 4 g/dL (ref 3.5–5.0)
BUN: 23.5 mg/dL (ref 7.0–26.0)
CHLORIDE: 109 meq/L (ref 98–109)
CO2: 23 mEq/L (ref 22–29)
CREATININE: 0.8 mg/dL (ref 0.7–1.3)
Calcium: 9.2 mg/dL (ref 8.4–10.4)
EGFR: 84 mL/min/{1.73_m2} — ABNORMAL LOW (ref >90)
GLUCOSE: 89 mg/dL (ref 70–140)
Potassium: 4.5 mEq/L (ref 3.5–5.1)
Sodium: 140 mEq/L (ref 136–145)
Total Bilirubin: 1.47 mg/dL — ABNORMAL HIGH (ref 0.20–1.20)
Total Protein: 7 g/dL (ref 6.4–8.3)

## 2014-11-03 LAB — CBC & DIFF AND RETIC
BASO%: 0.6 % (ref 0.0–2.0)
Basophils Absolute: 0 10*3/uL (ref 0.0–0.1)
EOS%: 1.7 % (ref 0.0–7.0)
Eosinophils Absolute: 0.1 10*3/uL (ref 0.0–0.5)
HCT: 41.4 % (ref 38.4–49.9)
HGB: 13.6 g/dL (ref 13.0–17.1)
Immature Retic Fract: 14.2 % — ABNORMAL HIGH (ref 3.00–10.60)
LYMPH%: 24.8 % (ref 14.0–49.0)
MCH: 35.9 pg — ABNORMAL HIGH (ref 27.2–33.4)
MCHC: 32.9 g/dL (ref 32.0–36.0)
MCV: 109.2 fL — ABNORMAL HIGH (ref 79.3–98.0)
MONO#: 0.3 10*3/uL (ref 0.1–0.9)
MONO%: 8.8 % (ref 0.0–14.0)
NEUT#: 2.3 10*3/uL (ref 1.5–6.5)
NEUT%: 64.1 % (ref 39.0–75.0)
PLATELETS: 63 10*3/uL — AB (ref 140–400)
RBC: 3.79 10*6/uL — ABNORMAL LOW (ref 4.20–5.82)
RDW: 15.7 % — ABNORMAL HIGH (ref 11.0–14.6)
RETIC %: 2.1 % — AB (ref 0.80–1.80)
Retic Ct Abs: 79.59 10*3/uL (ref 34.80–93.90)
WBC: 3.5 10*3/uL — AB (ref 4.0–10.3)
lymph#: 0.9 10*3/uL (ref 0.9–3.3)

## 2014-11-03 LAB — LACTATE DEHYDROGENASE (CC13): LDH: 267 U/L — AB (ref 125–245)

## 2014-11-03 LAB — CHCC SMEAR

## 2014-11-03 NOTE — Telephone Encounter (Signed)
per pof to sch pt appt-gave pt copy of avs °

## 2014-11-04 ENCOUNTER — Other Ambulatory Visit: Payer: Self-pay | Admitting: *Deleted

## 2014-11-04 ENCOUNTER — Encounter: Payer: Self-pay | Admitting: Hematology

## 2014-11-04 DIAGNOSIS — D7589 Other specified diseases of blood and blood-forming organs: Secondary | ICD-10-CM

## 2014-11-04 DIAGNOSIS — D696 Thrombocytopenia, unspecified: Secondary | ICD-10-CM

## 2014-11-04 NOTE — Progress Notes (Signed)
[place]   Hematology oncology progress note  Date of service: 11/03/2014  Patient Care Team: Antony Blackbird, MD as PCP - General (Family Medicine) Brunetta Genera, MD as Consulting Physician (Hematology) Belva Crome, MD as Consulting Physician (Cardiology)  CHIEF COMPLAINTS: follow-up for macrocytosis and thrombocytopenia.  HISTORY OF PRESENTING ILLNESS:   [name] pleasant 79 year old Caucasian male with a history of coronary disease status post CABG in 1994, aortic valve replacement for aortic stenosis with mechanical valve in 1994 on chronic Coumadin therapy, carotid artery stenosis status post subclavian bypass, obstructive sleep apnea, hypertension has been referred to [place] in consultation for evaluation and management of thrombocytopenia by his primary care physician Dr. Chapman Fitch.  He has been known to have mild thrombocytopenia in the past with labs in our system from 2010 showing platelets in the range of 109k to 186,000. He also had a couple of readings of platelets in the 61,000 range which were noted to have obvious platelet clumping. Patient reports that he was told that his platelet counts a couple weeks ago with his primary care physician were 73,000. We do not have access to these laboratory values at this time.  He reports no overt spontaneous bleeding. Did notice bruising over his upper and lower extremities from a recent fall from a stepping stool after bumping into a door frame. Has been stably on Coumadin from 1994 without any major bleeding.   Interval history  [name] is here for follow-up with his wife. He notes no acute new concerns. No overt new bleeding. No epistaxis/GI bleeding/hematuria. Here to follow up on his lab results.     MEDICAL HISTORY:  Past Medical History  Diagnosis Date  . Hx of CABG   . Aortic stenosis   . Chronic atrial fibrillation   . CHF with unknown LVEF     47 %  . Subclavian bypass stenosis   . Rheumatic  fever   . Vitamin B12 deficiency   . OSA (obstructive sleep apnea)   . Hypertension   . Carotid artery disease 1994    s/p left carotid endarerectomy   . Left ventricular dysfunction 2007    LVEF 47%     SURGICAL HISTORY: Past Surgical History  Procedure Laterality Date  . Heart bypass      Done in [place]  . Cardiac valve surgery  1994    replaced due to aortic stenosis, [employer] mechanical prostesis  . Coronary artery bypass graft  1994    w SVG to RCA and SVG to circumflex  . Carotid endarterectomy Left 1997    subclavian bypass Done in [place]    SOCIAL HISTORY: History   Social History  . Marital Status: Married    Spouse Name: N/A  . Number of Children: N/A  . Years of Education: N/A   Occupational History  . Not on file.   Social History Main Topics  . Smoking status: Former Smoker -- 1.00 packs/day for 30 years    Types: Cigarettes    Quit date: 04/03/1992  . Smokeless tobacco: Never Used  . Alcohol Use: Yes     Comment: 4-6 ounces of wine daily  . Drug Use: No  . Sexual Activity: Not on file   Other Topics Concern  . Not on file   Social History Narrative    FAMILY HISTORY: Family History  Problem Relation Age of Onset  . Cancer Father     lung   . Hypertension Mother  ALLERGIES:  is allergic to rifampin and vancomycin.  MEDICATIONS:  Current Outpatient Prescriptions  Medication Sig Dispense Refill  . carvedilol (COREG) 25 MG tablet Take 1 tablet (25 mg total) by mouth 2 (two) times daily with a meal. 180 tablet 0  . enalapril (VASOTEC) 20 MG tablet TAKE 1 TABLET BY MOUTH EVERY DAY 90 tablet 1  . ezetimibe-simvastatin (VYTORIN) 10-20 MG per tablet Take 1 tablet by mouth at bedtime. 90 tablet 2  . fluocinonide cream (LIDEX) 0.05 % Apply topically 2 (two) times daily.    . furosemide (LASIX) 40 MG tablet Take 40 mg by mouth as needed.     Marland Kitchen glucose blood (ONE TOUCH ULTRA TEST) test strip USE AS DIRECTED TO TEST BLOOD GLUCOSE  EVERY  OTHER DAY DX: 250.00 50 each 2  . metFORMIN (GLUCOPHAGE-XR) 750 MG 24 hr tablet TAKE 3 TABLETS BY MOUTH EVERY DAY 270 tablet 1  . NON FORMULARY BIPAP    . NON FORMULARY ONE TOUCH ULTRA TEST STRIPS  AND LANCETS    . ONETOUCH DELICA LANCETS FINE MISC Use to obtain a blood specimen every other day Dx code 250.00 100 each 1  . potassium chloride (KLOR-CON M10) 10 MEQ tablet Take 1 tablet (10 mEq total) by mouth daily. 30 tablet 11  . PROAIR HFA 108 (90 BASE) MCG/ACT inhaler Inhale 2 puffs into the lungs as needed. For chest congestion    . vitamin B-12 (CYANOCOBALAMIN) 1000 MCG tablet Take 1,000 mcg by mouth daily.    Marland Kitchen warfarin (COUMADIN) 5 MG tablet TAKE AS DIRECTED BY COUMADIN CLINIC 120 tablet 1   No current facility-administered medications for this visit.    REVIEW OF SYSTEMS:   Constitutional: Denies fevers, chills or abnormal night sweats Eyes: Denies blurriness of vision, double vision or watery eyes Ears, nose, mouth, throat, and face: Denies mucositis or sore throat Respiratory: Denies cough, dyspnea or wheezes Cardiovascular: Denies palpitation, chest discomfort or lower extremity swelling Gastrointestinal:  Denies nausea, heartburn or change in bowel habits Skin: Denies abnormal skin rashes Lymphatics: Denies new lymphadenopathy or easy bruising Neurological:Denies numbness, tingling or new weaknesses Behavioral/Psych: Mood is stable, no new changes  All other systems were reviewed with the patient and are negative.  PHYSICAL EXAMINATION: ECOG PERFORMANCE STATUS: 1 - Symptomatic but completely ambulatory  Filed Vitals:   11/03/14 1005  BP: 123/63  Pulse: 65  Temp: 98.2 F (36.8 C)  Resp: 18   Filed Weights   11/03/14 1005  Weight: 266 lb 6.4 oz (120.838 kg)    GENERAL:alert, no distress and comfortable SKIN: skin color, texture, turgor are normal, no rashes or significant lesions some traumatic bruising noted on his upper and lower extremity from recent fall.No  petechiae .  EYES: normal, conjunctiva are pink and non-injected, sclera clear OROPHARYNX:no exudate, no erythema and lips, buccal mucosa, and tongue normal  NECK: supple, thyroid normal size, non-tender, without nodularity LYMPH:  no palpable lymphadenopathy in the cervical, axillary or inguinal LUNGS: clear to auscultation and percussion with normal breathing effort HEART: regular rate & rhythm and no murmurs and no lower extremity edema.clear click of his metallic aortic valve noted.  ABDOMEN:abdomen soft, non-tender and normal bowel sounds Musculoskeletal:no cyanosis of digits and no clubbing  PSYCH: alert & oriented x 3 with fluent speech NEURO: no focal motor/sensory deficits  LABORATORY DATA:  I have reviewed the data as listed Recent Results (from the past 2160 hour(s))  Hemoglobin A1c     Status: None   Collection  Time: 08/11/14  9:04 AM  Result Value Ref Range   Hgb A1c MFr Bld 5.4 4.6 - 6.5 %    Comment: Glycemic Control Guidelines for People with Diabetes:Non Diabetic:  <6%Goal of Therapy: <7%Additional Action Suggested:  >8%   Comprehensive metabolic panel     Status: Abnormal   Collection Time: 08/11/14  9:04 AM  Result Value Ref Range   Sodium 138 135 - 145 mEq/L   Potassium 4.8 3.5 - 5.1 mEq/L   Chloride 105 96 - 112 mEq/L   CO2 27 19 - 32 mEq/L   Glucose, Bld 103 (H) 70 - 99 mg/dL   BUN 23 6 - 23 mg/dL   Creatinine, Ser 0.94 0.40 - 1.50 mg/dL   Total Bilirubin 1.2 0.2 - 1.2 mg/dL   Alkaline Phosphatase 87 39 - 117 U/L   AST 19 0 - 37 U/L   ALT 12 0 - 53 U/L   Total Protein 6.9 6.0 - 8.3 g/dL   Albumin 3.9 3.5 - 5.2 g/dL   Calcium 9.4 8.4 - 10.5 mg/dL   GFR 82.64 >60.00 mL/min  Microalbumin / creatinine urine ratio     Status: Abnormal   Collection Time: 08/14/14 10:40 AM  Result Value Ref Range   Microalb, Ur 21.1 (H) 0.0 - 1.9 mg/dL   Creatinine,U 80.6 mg/dL   Microalb Creat Ratio 26.2 0.0 - 30.0 mg/g  POCT urinalysis dipstick     Status: Abnormal    Collection Time: 08/14/14 10:51 AM  Result Value Ref Range   Color, UA Dark Yellow    Clarity, UA Cloudy    Glucose, UA Neg    Bilirubin, UA Neg    Ketones, UA Neg    Spec Grav, UA 1.015    Blood, UA Mod    pH, UA 6.0    Protein, UA 30+    Urobilinogen, UA 0.2    Nitrite, UA Neg    Leukocytes, UA Negative   POCT INR     Status: None   Collection Time: 08/19/14  8:59 AM  Result Value Ref Range   INR 2.4   POCT INR     Status: None   Collection Time: 09/04/14  8:47 AM  Result Value Ref Range   INR 3.0   POCT INR     Status: None   Collection Time: 09/25/14  8:44 AM  Result Value Ref Range   INR 3.5   CBC with Differential/Platelet     Status: Abnormal   Collection Time: 10/20/14 12:28 PM  Result Value Ref Range   WBC 3.8 (L) 4.0 - 10.3 10e3/uL   NEUT# 2.6 1.5 - 6.5 10e3/uL   HGB 13.4 13.0 - 17.1 g/dL   HCT 40.7 38.4 - 49.9 %   Platelets 65 Platelet count consistent in citrate (L) 140 - 400 10e3/uL   MCV 109.1 (H) 79.3 - 98.0 fL   MCH 35.9 (H) 27.2 - 33.4 pg   MCHC 32.9 32.0 - 36.0 g/dL   RBC 3.73 (L) 4.20 - 5.82 10e6/uL   RDW 16.1 (H) 11.0 - 14.6 %   lymph# 0.8 (L) 0.9 - 3.3 10e3/uL   MONO# 0.4 0.1 - 0.9 10e3/uL   Eosinophils Absolute 0.1 0.0 - 0.5 10e3/uL   Basophils Absolute 0.0 0.0 - 0.1 10e3/uL   NEUT% 67.3 39.0 - 75.0 %   LYMPH% 20.3 14.0 - 49.0 %   MONO% 10.8 0.0 - 14.0 %   EOS% 1.3 0.0 - 7.0 %   BASO%  0.3 0.0 - 2.0 %  TECHNOLOGIST REVIEW     Status: None   Collection Time: 10/20/14 12:28 PM  Result Value Ref Range   Technologist Review Platelet agrees with Slide Est   Comprehensive metabolic panel     Status: Abnormal   Collection Time: 10/20/14 12:29 PM  Result Value Ref Range   Sodium 140 136 - 145 mEq/L   Potassium 4.5 3.5 - 5.1 mEq/L   Chloride 110 (H) 98 - 109 mEq/L   CO2 21 (L) 22 - 29 mEq/L   Glucose 95 70 - 140 mg/dl   BUN 28.7 (H) 7.0 - 26.0 mg/dL   Creatinine 1.1 0.7 - 1.3 mg/dL   Total Bilirubin 1.10 0.20 - 1.20 mg/dL   Alkaline  Phosphatase 95 40 - 150 U/L   AST 21 5 - 34 U/L   ALT 15 0 - 55 U/L   Total Protein 6.9 6.4 - 8.3 g/dL   Albumin 3.8 3.5 - 5.0 g/dL   Calcium 9.0 8.4 - 10.4 mg/dL   Anion Gap 9 3 - 11 mEq/L   EGFR 63 (L) >90 ml/min/1.73 m2    Comment: eGFR is calculated using the CKD-EPI Creatinine Equation (2009)  Smear     Status: None   Collection Time: 10/20/14 12:29 PM  Result Value Ref Range   Smear Result Smear Available   POCT INR     Status: None   Collection Time: 10/23/14  8:52 AM  Result Value Ref Range   INR 4.5   Vitamin B12     Status: None   Collection Time: 10/26/14 10:34 AM  Result Value Ref Range   Vitamin B-12 393 211 - 911 pg/mL  CBC & Diff and Retic     Status: Abnormal   Collection Time: 10/26/14 10:34 AM  Result Value Ref Range   WBC 3.2 (L) 4.0 - 10.3 10e3/uL   NEUT# 2.2 1.5 - 6.5 10e3/uL   HGB 13.4 13.0 - 17.1 g/dL   HCT 41.2 38.4 - 49.9 %   Platelets 61 (L) 140 - 400 10e3/uL   MCV 109.3 (H) 79.3 - 98.0 fL   MCH 35.5 (H) 27.2 - 33.4 pg   MCHC 32.5 32.0 - 36.0 g/dL   RBC 3.77 (L) 4.20 - 5.82 10e6/uL   RDW 16.0 (H) 11.0 - 14.6 %   lymph# 0.7 (L) 0.9 - 3.3 10e3/uL   MONO# 0.2 0.1 - 0.9 10e3/uL   Eosinophils Absolute 0.1 0.0 - 0.5 10e3/uL   Basophils Absolute 0.0 0.0 - 0.1 10e3/uL   NEUT% 69.1 39.0 - 75.0 %   LYMPH% 22.2 14.0 - 49.0 %   MONO% 6.2 0.0 - 14.0 %   EOS% 1.9 0.0 - 7.0 %   BASO% 0.6 0.0 - 2.0 %   Retic % 2.26 (H) 0.80 - 1.80 %   Retic Ct Abs 85.20 34.80 - 93.90 10e3/uL   Immature Retic Fract 17.30 (H) 3.00 - 10.60 %  Lactate dehydrogenase     Status: Abnormal   Collection Time: 10/26/14 10:34 AM  Result Value Ref Range   LDH 260 (H) 125 - 245 U/L  Hepatitis B surface antigen     Status: None   Collection Time: 10/26/14 10:34 AM  Result Value Ref Range   Hepatitis B Surface Ag NEGATIVE NEGATIVE  Haptoglobin     Status: Abnormal   Collection Time: 10/26/14 10:34 AM  Result Value Ref Range   Haptoglobin <15 (L) 43 - 212 mg/dL  Hepatitis C  antibody     Status: None   Collection Time: 10/26/14 10:34 AM  Result Value Ref Range   HCV Ab NEGATIVE NEGATIVE  Hepatitis B core antibody, total     Status: None   Collection Time: 10/26/14 10:34 AM  Result Value Ref Range   Hep B Core Total Ab NON REACTIVE NON REACTIVE  Folate, Serum     Status: None   Collection Time: 10/26/14 10:34 AM  Result Value Ref Range   Folate 5.1 ng/mL    Comment:  Reference Ranges        Deficient:       0.4 - 3.3 ng/mL        Indeterminate:   3.4 - 5.4 ng/mL        Normal:              > 5.4 ng/mL   CBC & Diff and Retic     Status: Abnormal   Collection Time: 11/03/14 12:50 PM  Result Value Ref Range   WBC 3.5 (L) 4.0 - 10.3 10e3/uL   NEUT# 2.3 1.5 - 6.5 10e3/uL   HGB 13.6 13.0 - 17.1 g/dL   HCT 41.4 38.4 - 49.9 %   Platelets 63 (L) 140 - 400 10e3/uL   MCV 109.2 (H) 79.3 - 98.0 fL   MCH 35.9 (H) 27.2 - 33.4 pg   MCHC 32.9 32.0 - 36.0 g/dL   RBC 3.79 (L) 4.20 - 5.82 10e6/uL   RDW 15.7 (H) 11.0 - 14.6 %   lymph# 0.9 0.9 - 3.3 10e3/uL   MONO# 0.3 0.1 - 0.9 10e3/uL   Eosinophils Absolute 0.1 0.0 - 0.5 10e3/uL   Basophils Absolute 0.0 0.0 - 0.1 10e3/uL   NEUT% 64.1 39.0 - 75.0 %   LYMPH% 24.8 14.0 - 49.0 %   MONO% 8.8 0.0 - 14.0 %   EOS% 1.7 0.0 - 7.0 %   BASO% 0.6 0.0 - 2.0 %   Retic % 2.10 (H) 0.80 - 1.80 %   Retic Ct Abs 79.59 34.80 - 93.90 10e3/uL   Immature Retic Fract 14.20 (H) 3.00 - 10.60 %  Comprehensive metabolic panel     Status: Abnormal   Collection Time: 11/03/14 12:50 PM  Result Value Ref Range   Sodium 140 136 - 145 mEq/L   Potassium 4.5 3.5 - 5.1 mEq/L   Chloride 109 98 - 109 mEq/L   CO2 23 22 - 29 mEq/L   Glucose 89 70 - 140 mg/dl   BUN 23.5 7.0 - 26.0 mg/dL   Creatinine 0.8 0.7 - 1.3 mg/dL   Total Bilirubin 1.47 (H) 0.20 - 1.20 mg/dL   Alkaline Phosphatase 100 40 - 150 U/L   AST 21 5 - 34 U/L   ALT 13 0 - 55 U/L   Total Protein 7.0 6.4 - 8.3 g/dL   Albumin 4.0 3.5 - 5.0 g/dL   Calcium 9.2 8.4 - 10.4 mg/dL   Anion  Gap 7 3 - 11 mEq/L   EGFR 84 (L) >90 ml/min/1.73 m2    Comment: eGFR is calculated using the CKD-EPI Creatinine Equation (2009)  Smear     Status: None   Collection Time: 11/03/14 12:50 PM  Result Value Ref Range   Smear Result Smear Available   Lactate dehydrogenase     Status: Abnormal   Collection Time: 11/03/14 12:50 PM  Result Value Ref Range   LDH 267 (H) 125 - 245 U/L  Direct Antiglobulin rfx Anti-C3/IgG  Status: None (Preliminary result)   Collection Time: 11/03/14 12:50 PM  Result Value Ref Range   DAT, Polyspecific NEG NEGATIVE  this is grossly: No anterior no more than expected  RADIOGRAPHIC STUDIES: I have personally reviewed the radiological images as listed and agreed with the findings in the report. US Abdomen Complete  10/26/2014   CLINICAL DATA:  Thrombus cytopenia of uncertain etiology  EXAM: ULTRASOUND ABDOMEN COMPLETE  COMPARISON:  Axial source images from a virtual colonoscopy of March 16, 2010 and abdominal ultrasound of January 01, 2009  FINDINGS: Gallbladder: No gallstones or wall thickening visualized. No sonographic Murphy sign noted.  Common bile duct: Diameter: 2.8 mm  Liver: The liver exhibits normal echotexture with no focal mass or ductal dilation. The surface contour is normal.  IVC: No abnormality visualized.  Pancreas: Bowel gas limits evaluation of the pancreas.  Spleen: 10.2 x 10 x 11.8 cm. The splenic volume is 602 cc which is increased.  Right Kidney: Length: 12.3 cm. Echogenicity within normal limits. There is mild cortical thinning. No mass or hydronephrosis visualized.  Left Kidney: Length: 11.7 cm. Echogenicity within normal limits. There is mild cortical thinning. No mass or hydronephrosis visualized.  Abdominal aorta: No aneurysm visualized. The distal aorta was obscured by bowel gas.  Other findings: There is no ascites.  IMPRESSION: 1. Normal appearance of the liver and gallbladder. 2. Mild splenomegaly. 3. The pancreas was poorly evaluated due  to bowel gas. 4. Cortical thinning of both kidneys not out of proportion to the patient's age.   Electronically Signed   By: David  Martinique M.D.   On: 10/26/2014 10:34    ASSESSMENT & PLAN   1) Thrombocytopenia. Moderate. Platelet counts remain stable today at 63,000. No evidence of clinical bleeding. Hemoglobin stable. No evidence of pseudothrombocytopenia on my personal review of his peripheral blood smear. No platelet satellitism. 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 + cannot rule out ITP element. No evidence of TTP/HUS spectrum disorders based on peripheral blood smear/disease presentation and tempo.  His immature platelet fraction is 2.9 which is in low and suggests insufficient bone marrow response as well. This could be due to a subclinical viral infection or possible MDS. He has some pelgeroid neutrophils on his peripheral blood smear that are suggestive of possible MDS. Hepatitis B, and C serologies negative. No evidence of a lymphoproliferative syndrome on peripheral blood smear or clinical examination Plan -avoid medications causing thrombocytopenia. -If platelet counts drop below 50,000 might need to limit the INR fluctuations on Coumadin from 2-3 more strictly. -We will have patient start folic acid replacement given low normal levels and risk for folate depletion with chronic hemolysis related to his valves. Continue aggressive B12 replacement. -If progressive drop in platelet counts or developing bicytopenias would consider a bone marrow examination to look for MDS. -absolute alcohol cessation -Avoid NSAIDS and other medications known to cause thrombocytopenia especially ranitidine and other H2 blockers, sulfa drugs, PPIs. -Would consider replacing his vitamin B12 to 2000 g sublingually daily or 1019mg [place] q2-4 weeks since ongoing metformin therapy is a limiting factor in absorbing vitamin B12  and is an active risk factor for patients to develop vitamin B12 once they're on treatment for 3-5 years.  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 Patient has no clinical evidence of hemoglobinuria. Peripheral blood smear showed minimal rare schistocytes. No micro-spherocytes to suggest autoimmune hemolysis.Coombs negative. 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 PLAN -aggressive B12 replacement -Would add folic acid 1-2 mg po daily to maintain erythropoiesis in the setting of increased demand  from chronic hemolysis. -Bone marrow examination if worsening anemia or other cytopenia.  Return to clinic with Dr. Irene Limbo in 4 weeks with repeat CBC, CMP. Patient counseled to call earlier if any new acute concerns or questions arise.  I appreciate the privilege of taking part in the care of this wonderful gentleman.  Total time spent 40 minutes more than 50% of the time in direct patient care counseling and coordination of care.  Sullivan Lone MD [place] [facility]/On[facility] Physician [facility]  (Office):       [phone] (Work cell):  [phone] (Fax):           [phone]

## 2014-11-06 ENCOUNTER — Ambulatory Visit (INDEPENDENT_AMBULATORY_CARE_PROVIDER_SITE_OTHER): Payer: Medicare Other | Admitting: *Deleted

## 2014-11-06 DIAGNOSIS — I482 Chronic atrial fibrillation, unspecified: Secondary | ICD-10-CM

## 2014-11-06 DIAGNOSIS — I359 Nonrheumatic aortic valve disorder, unspecified: Secondary | ICD-10-CM

## 2014-11-06 DIAGNOSIS — Z5181 Encounter for therapeutic drug level monitoring: Secondary | ICD-10-CM | POA: Diagnosis not present

## 2014-11-06 LAB — SPEP & IFE WITH QIG
ALBUMIN ELP: 4.1 g/dL (ref 3.8–4.8)
ALPHA-2-GLOBULIN: 0.6 g/dL (ref 0.5–0.9)
Alpha-1-Globulin: 0.3 g/dL (ref 0.2–0.3)
Beta 2: 0.5 g/dL (ref 0.2–0.5)
Beta Globulin: 0.5 g/dL (ref 0.4–0.6)
GAMMA GLOBULIN: 1 g/dL (ref 0.8–1.7)
IGA: 244 mg/dL (ref 68–379)
IGM, SERUM: 60 mg/dL (ref 41–251)
IgG (Immunoglobin G), Serum: 1070 mg/dL (ref 650–1600)
TOTAL PROTEIN, SERUM ELECTROPHOR: 6.9 g/dL (ref 6.1–8.1)

## 2014-11-06 LAB — POCT INR: INR: 3.8

## 2014-11-09 LAB — EPSTEIN BARR VIRUS DNA, QUANT RTPCR: EBV DNA, QN PCR: <200 copies/mL

## 2014-11-09 LAB — DIRECT ANTIGLOBULIN RFX ANTI-C3/IGG: DAT, POLYSPECIFIC: NEGATIVE

## 2014-11-09 LAB — PLATELET ANTIBODY SCREEN (INDIRECT)
PLT AB GP IA/IIA: NEGATIVE
PLT AB GP IB/IX: NEGATIVE
PLT AB:HLA CLASS I: NEGATIVE
Plt Ab:GP IIb/IIIa: NEGATIVE

## 2014-11-09 LAB — ADAMTS13 ACTIVITY: Adamts 13 Activity: 137 % Activity (ref 68–163)

## 2014-11-12 ENCOUNTER — Other Ambulatory Visit: Payer: Self-pay | Admitting: *Deleted

## 2014-11-12 DIAGNOSIS — I739 Peripheral vascular disease, unspecified: Secondary | ICD-10-CM

## 2014-11-19 ENCOUNTER — Encounter: Payer: Self-pay | Admitting: Vascular Surgery

## 2014-11-20 ENCOUNTER — Ambulatory Visit (INDEPENDENT_AMBULATORY_CARE_PROVIDER_SITE_OTHER): Payer: Medicare Other | Admitting: Vascular Surgery

## 2014-11-20 ENCOUNTER — Ambulatory Visit (INDEPENDENT_AMBULATORY_CARE_PROVIDER_SITE_OTHER): Payer: Medicare Other | Admitting: *Deleted

## 2014-11-20 ENCOUNTER — Ambulatory Visit (HOSPITAL_COMMUNITY)
Admission: RE | Admit: 2014-11-20 | Discharge: 2014-11-20 | Disposition: A | Discharge status: Home / Self Care | Payer: Medicare Other | Source: Ambulatory Visit | Attending: Vascular Surgery | Admitting: Vascular Surgery

## 2014-11-20 ENCOUNTER — Other Ambulatory Visit: Payer: Self-pay | Admitting: Vascular Surgery

## 2014-11-20 ENCOUNTER — Encounter: Payer: Self-pay | Admitting: Vascular Surgery

## 2014-11-20 VITALS — BP 120/60 | HR 84 | Ht 74.0 in | Wt 264.2 lb

## 2014-11-20 DIAGNOSIS — I739 Peripheral vascular disease, unspecified: Secondary | ICD-10-CM | POA: Diagnosis present

## 2014-11-20 DIAGNOSIS — I482 Chronic atrial fibrillation, unspecified: Secondary | ICD-10-CM

## 2014-11-20 DIAGNOSIS — Z5181 Encounter for therapeutic drug level monitoring: Secondary | ICD-10-CM

## 2014-11-20 DIAGNOSIS — I359 Nonrheumatic aortic valve disorder, unspecified: Secondary | ICD-10-CM | POA: Diagnosis not present

## 2014-11-20 DIAGNOSIS — I70219 Atherosclerosis of native arteries of extremities with intermittent claudication, unspecified extremity: Secondary | ICD-10-CM | POA: Diagnosis not present

## 2014-11-20 LAB — POCT INR: INR: 2.7

## 2014-11-20 NOTE — Progress Notes (Addendum)
Established Intermittent Claudication   History of Present Illness  [name] is a 79 y.o. (08-10-1935) male who presents with chief complaint: right thigh cramping.  The patient's symptoms have not progressed.  The patient's symptoms are: right thigh cramping and fatigue extending to calf with > 1 block walking.  The patient's treatment regimen currently included: maximal medical management and walking plan.  The patient's PMH, PSH, and SH, and FamHx are unchanged from 10/21/14.  Current Outpatient Prescriptions  Medication Sig Dispense Refill  . carvedilol (COREG) 25 MG tablet Take 1 tablet (25 mg total) by mouth 2 (two) times daily with a meal. 180 tablet 0  . enalapril (VASOTEC) 20 MG tablet TAKE 1 TABLET BY MOUTH EVERY DAY 90 tablet 1  . ezetimibe-simvastatin (VYTORIN) 10-20 MG per tablet Take 1 tablet by mouth at bedtime. 90 tablet 2  . fluocinonide cream (LIDEX) 0.05 % Apply topically 2 (two) times daily.    . furosemide (LASIX) 40 MG tablet Take 40 mg by mouth as needed.     Marland Kitchen glucose blood (ONE TOUCH ULTRA TEST) test strip USE AS DIRECTED TO TEST BLOOD GLUCOSE  EVERY OTHER DAY DX: 250.00 50 each 2  . metFORMIN (GLUCOPHAGE-XR) 750 MG 24 hr tablet TAKE 3 TABLETS BY MOUTH EVERY DAY 270 tablet 1  . NON FORMULARY BIPAP    . NON FORMULARY ONE TOUCH ULTRA TEST STRIPS  AND LANCETS    . ONETOUCH DELICA LANCETS FINE MISC Use to obtain a blood specimen every other day Dx code 250.00 100 each 1  . potassium chloride (KLOR-CON M10) 10 MEQ tablet Take 1 tablet (10 mEq total) by mouth daily. 30 tablet 11  . PROAIR HFA 108 (90 BASE) MCG/ACT inhaler Inhale 2 puffs into the lungs as needed. For chest congestion    . vitamin B-12 (CYANOCOBALAMIN) 1000 MCG tablet Take 1,000 mcg by mouth daily.    Marland Kitchen warfarin (COUMADIN) 5 MG tablet TAKE AS DIRECTED BY COUMADIN CLINIC 120 tablet 1   No current facility-administered medications for this visit.    Allergies  Allergen Reactions  . Rifampin    REACTION: rash (unclear if due to vanco or rifampin)  . Vancomycin     REACTION: rash (unclear if due to rifampin or vanco)    On ROS today: no rest pain, no ulcers.   Physical Examination  Filed Vitals:   11/20/14 1611  BP: 120/60  Pulse: 84  Height: 6\' 2"  (1.88 m)  Weight: 264 lb 3.2 oz (119.84 kg)  SpO2: 91%   Body mass index is 33.91 kg/(m^2).  General: A&O x 3, WDWN  Pulmonary: Sym exp, good air movt, CTAB, no rales, rhonchi, & wheezing  Cardiac: RRR, Nl S1, S2, no Murmurs, rubs or gallops  Vascular: Vessel Right Left  Radial Palpable Palpable  Brachial Palpable Palpable  Carotid Palpable, without bruit Palpable, without bruit  Aorta Not palpable N/A  Femoral Palpable Palpable  Popliteal Not palpable Not palpable  PT Palpable Palpable  DP Palpable Palpable   Gastrointestinal: soft, NTND, no G/R, no HSM, no masses, no CVAT B  Musculoskeletal: M/S 5/5 throughout , Extremities without ischemic changes   Neurologic: Pain and light touch intact in extremities , Motor exam as listed above   Non-Invasive Vascular Imaging Exercise ABI (Date: 11/20/2014)  R: 143 to 127 mm Hg drop with 2.5 min walking  L: 123 to 153 mm Hg increase with 2.5 min walking   Medical Decision Making  [name] is a  79 y.o. male who presents with:  right leg intermittent claudication without evidence of critical limb ischemia, R iliac artery stenosis.   Exercise ABI demonstrates some R iliac disease likely mild to moderate given the only 16 mm Hg drop off.  At this point the patient feels his sx are NOT lifestyle limiting, so I would hold off on endovascular intervention.  If his sx increase, I would consider: Aortogram, bilateral runoff, and possible right iliac artery intervention.  I would repeat the ABI in 3 months to monitor any progression in his right iliac disease.  I discussed in depth with the patient the nature of atherosclerosis, and emphasized the importance of  maximal medical management including strict control of blood pressure, blood glucose, and lipid levels, antiplatelet agents, obtaining regular exercise, and cessation of smoking.    The patient is aware that without maximal medical management the underlying atherosclerotic disease process will progress, limiting the benefit of any interventions. The patient is currently on a statin: Vytorin. The patient is currently on an anti-platelet: ASA.  Thank you for allowing [place] to participate in this patient's care.   Adele Barthel, MD [facility] Office: [phone] Pager: [phone]  11/20/2014, 4:54 PM

## 2014-12-01 ENCOUNTER — Ambulatory Visit (HOSPITAL_BASED_OUTPATIENT_CLINIC_OR_DEPARTMENT_OTHER): Payer: Medicare Other | Admitting: Hematology

## 2014-12-01 ENCOUNTER — Ambulatory Visit: Payer: Medicare Other

## 2014-12-01 ENCOUNTER — Encounter: Payer: Self-pay | Admitting: Hematology

## 2014-12-01 ENCOUNTER — Ambulatory Visit (HOSPITAL_BASED_OUTPATIENT_CLINIC_OR_DEPARTMENT_OTHER): Payer: Medicare Other

## 2014-12-01 ENCOUNTER — Telehealth: Payer: Self-pay | Admitting: Hematology

## 2014-12-01 VITALS — BP 118/57 | HR 81 | Temp 98.5°F | Resp 18 | Ht 74.0 in | Wt 267.4 lb

## 2014-12-01 DIAGNOSIS — D7589 Other specified diseases of blood and blood-forming organs: Secondary | ICD-10-CM | POA: Diagnosis not present

## 2014-12-01 DIAGNOSIS — D696 Thrombocytopenia, unspecified: Secondary | ICD-10-CM | POA: Diagnosis not present

## 2014-12-01 LAB — CBC & DIFF AND RETIC
BASO%: 1 % (ref 0.0–2.0)
BASOS ABS: 0 10*3/uL (ref 0.0–0.1)
EOS%: 1.9 % (ref 0.0–7.0)
Eosinophils Absolute: 0.1 10*3/uL (ref 0.0–0.5)
HEMATOCRIT: 41.5 % (ref 38.4–49.9)
HEMOGLOBIN: 13.5 g/dL (ref 13.0–17.1)
Immature Retic Fract: 14.7 % — ABNORMAL HIGH (ref 3.00–10.60)
LYMPH%: 18.7 % (ref 14.0–49.0)
MCH: 36 pg — ABNORMAL HIGH (ref 27.2–33.4)
MCHC: 32.5 g/dL (ref 32.0–36.0)
MCV: 110.7 fL — AB (ref 79.3–98.0)
MONO#: 0.3 10*3/uL (ref 0.1–0.9)
MONO%: 10.8 % (ref 0.0–14.0)
NEUT#: 2.1 10*3/uL (ref 1.5–6.5)
NEUT%: 67.6 % (ref 39.0–75.0)
PLATELETS: 58 10*3/uL — AB (ref 140–400)
RBC: 3.75 10*6/uL — ABNORMAL LOW (ref 4.20–5.82)
RDW: 15.5 % — AB (ref 11.0–14.6)
RETIC %: 2.29 % — AB (ref 0.80–1.80)
Retic Ct Abs: 85.88 10*3/uL (ref 34.80–93.90)
WBC: 3.2 10*3/uL — ABNORMAL LOW (ref 4.0–10.3)
lymph#: 0.6 10*3/uL — ABNORMAL LOW (ref 0.9–3.3)
nRBC: 0 % (ref 0–0)

## 2014-12-01 LAB — COMPREHENSIVE METABOLIC PANEL (CC13)
ALBUMIN: 3.9 g/dL (ref 3.5–5.0)
ALK PHOS: 90 U/L (ref 40–150)
ALT: 14 U/L (ref 0–55)
ANION GAP: 8 meq/L (ref 3–11)
AST: 19 U/L (ref 5–34)
BUN: 17.6 mg/dL (ref 7.0–26.0)
CALCIUM: 9.4 mg/dL (ref 8.4–10.4)
CHLORIDE: 110 meq/L — AB (ref 98–109)
CO2: 23 mEq/L (ref 22–29)
Creatinine: 0.8 mg/dL (ref 0.7–1.3)
EGFR: 84 mL/min/{1.73_m2} — AB (ref >90)
Glucose: 104 mg/dl (ref 70–140)
Potassium: 4.4 mEq/L (ref 3.5–5.1)
Sodium: 141 mEq/L (ref 136–145)
Total Bilirubin: 1.51 mg/dL — ABNORMAL HIGH (ref 0.20–1.20)
Total Protein: 6.9 g/dL (ref 6.4–8.3)

## 2014-12-01 LAB — CHCC SMEAR

## 2014-12-01 NOTE — Telephone Encounter (Signed)
per pof to sch pt appt-gave pt copy of avs °

## 2014-12-02 MED ORDER — VITAMIN B12 3000 MCG/ML SL LIQD: 2000.0000 ug | Freq: Every day | SUBLINGUAL | Status: DC

## 2014-12-02 MED ORDER — FOLIC ACID 1 MG PO TABS: 1.0000 mg | ORAL_TABLET | Freq: Every day | ORAL | Status: DC

## 2014-12-02 NOTE — Progress Notes (Signed)
[name]   Hematology oncology progress note  Date of service: 11/03/2014  Patient Care Team: Andrew Blackbird, MD as PCP - General (Family Medicine) Andrew Genera, MD as Consulting Physician (Hematology) Andrew Crome, MD as Consulting Physician (Cardiology)  CHIEF COMPLAINTS: follow-up for macrocytosis and thrombocytopenia.  HISTORY OF PRESENTING ILLNESS: Please see my initial note for details on initial presentation.  Interval history  [name] is here for scheduled follow-up orders thrombo-cytopenia. He notes no issues with excessive bleeding or bruising. Continues to be on Coumadin. Has not been on folic acid yet. Good energy levels. No acute new concerns.    MEDICAL HISTORY:  Past Medical History  Diagnosis Date  . Hx of CABG   . Aortic stenosis   . Chronic atrial fibrillation   . CHF with unknown LVEF     47 %  . Subclavian bypass stenosis   . Rheumatic fever   . Vitamin B12 deficiency   . OSA (obstructive sleep apnea)   . Hypertension   . Carotid artery disease 1994    s/p left carotid endarerectomy   . Left ventricular dysfunction 2007    LVEF 47%     SURGICAL HISTORY: Past Surgical History  Procedure Laterality Date  . Heart bypass      Done in [place]  . Cardiac valve surgery  1994    replaced due to aortic stenosis, [employer] mechanical prostesis  . Coronary artery bypass graft  1994    w SVG to RCA and SVG to circumflex  . Carotid endarterectomy Left 1997    subclavian bypass Done in [place]    SOCIAL HISTORY: Social History   Social History  . Marital Status: Married    Spouse Name: N/A  . Number of Children: N/A  . Years of Education: N/A   Occupational History  . Not on file.   Social History Main Topics  . Smoking status: Former Smoker -- 1.00 packs/day for 30 years    Types: Cigarettes    Quit date: 04/03/1992  . Smokeless tobacco: Never Used  . Alcohol Use: Yes     Comment: 4-6 ounces of wine daily  .  Drug Use: No  . Sexual Activity: Not on file   Other Topics Concern  . Not on file   Social History Narrative    FAMILY HISTORY: Family History  Problem Relation Age of Onset  . Cancer Father     lung   . Hypertension Mother     ALLERGIES:  is allergic to rifampin and vancomycin.  MEDICATIONS:  Current Outpatient Prescriptions  Medication Sig Dispense Refill  . carvedilol (COREG) 25 MG tablet Take 1 tablet (25 mg total) by mouth 2 (two) times daily with a meal. 180 tablet 0  . Cyanocobalamin (VITAMIN B12) 3000 MCG/ML LIQD Place 2,000 mcg under the tongue daily.    . enalapril (VASOTEC) 20 MG tablet TAKE 1 TABLET BY MOUTH EVERY DAY 90 tablet 1  . ezetimibe-simvastatin (VYTORIN) 10-20 MG per tablet Take 1 tablet by mouth at bedtime. 90 tablet 2  . fluocinonide cream (LIDEX) 0.05 % Apply topically 2 (two) times daily.    . folic acid (FOLVITE) 1 MG tablet Take 1 tablet (1 mg total) by mouth daily.    . furosemide (LASIX) 40 MG tablet Take 40 mg by mouth as needed.     Marland Kitchen glucose blood (ONE TOUCH ULTRA TEST) test strip USE AS DIRECTED TO TEST BLOOD GLUCOSE  EVERY OTHER DAY DX:  250.00 50 each 2  . metFORMIN (GLUCOPHAGE-XR) 750 MG 24 hr tablet TAKE 3 TABLETS BY MOUTH EVERY DAY 270 tablet 1  . NON FORMULARY BIPAP    . NON FORMULARY ONE TOUCH ULTRA TEST STRIPS  AND LANCETS    . ONETOUCH DELICA LANCETS FINE MISC Use to obtain a blood specimen every other day Dx code 250.00 100 each 1  . potassium chloride (KLOR-CON M10) 10 MEQ tablet Take 1 tablet (10 mEq total) by mouth daily. 30 tablet 11  . PROAIR HFA 108 (90 BASE) MCG/ACT inhaler Inhale 2 puffs into the lungs as needed. For chest congestion    . warfarin (COUMADIN) 5 MG tablet TAKE AS DIRECTED BY COUMADIN CLINIC 120 tablet 1   No current facility-administered medications for this visit.    REVIEW OF SYSTEMS:   Constitutional: Denies fevers, chills or abnormal night sweats Eyes: Denies blurriness of vision, double vision or watery  eyes Ears, nose, mouth, throat, and face: Denies mucositis or sore throat Respiratory: Denies cough, dyspnea or wheezes Cardiovascular: Denies palpitation, chest discomfort or lower extremity swelling Gastrointestinal:  Denies nausea, heartburn or change in bowel habits Skin: Denies abnormal skin rashes Lymphatics: Denies new lymphadenopathy or easy bruising Neurological:Denies numbness, tingling or new weaknesses Behavioral/Psych: Mood is stable, no new changes  All other systems were reviewed with the patient and are negative.  PHYSICAL EXAMINATION: ECOG PERFORMANCE STATUS: 1 - Symptomatic but completely ambulatory  Filed Vitals:   12/01/14 0942  BP: 118/57  Pulse: 81  Temp: 98.5 F (36.9 C)  Resp: 18   Filed Weights   12/01/14 0942  Weight: 267 lb 6.4 oz (121.292 kg)    GENERAL:alert, no distress and comfortable SKIN: skin color, texture, turgor are normal, no rashes or significant lesions some traumatic bruising noted on his upper and lower extremity from recent fall.No petechiae .  EYES: normal, conjunctiva are pink and non-injected, sclera clear OROPHARYNX:no exudate, no erythema and lips, buccal mucosa, and tongue normal  NECK: supple, thyroid normal size, non-tender, without nodularity LYMPH:  no palpable lymphadenopathy in the cervical, axillary or inguinal LUNGS: clear to auscultation and percussion with normal breathing effort HEART: regular rate & rhythm and no murmurs and no lower extremity edema.clear click of his metallic aortic valve noted.  ABDOMEN:abdomen soft, non-tender and normal bowel sounds Musculoskeletal:no cyanosis of digits and no clubbing  PSYCH: alert & oriented x 3 with fluent speech NEURO: no focal motor/sensory deficits  LABORATORY DATA:  I have reviewed the data as listed Recent Results (from the past 2160 hour(s))  POCT INR     Status: None   Collection Time: 09/04/14  8:47 AM  Result Value Ref Range   INR 3.0   POCT INR     Status:  None   Collection Time: 09/25/14  8:44 AM  Result Value Ref Range   INR 3.5   CBC with Differential/Platelet     Status: Abnormal   Collection Time: 10/20/14 12:28 PM  Result Value Ref Range   WBC 3.8 (L) 4.0 - 10.3 10e3/uL   NEUT# 2.6 1.5 - 6.5 10e3/uL   HGB 13.4 13.0 - 17.1 g/dL   HCT 40.7 38.4 - 49.9 %   Platelets 65 Platelet count consistent in citrate (L) 140 - 400 10e3/uL   MCV 109.1 (H) 79.3 - 98.0 fL   MCH 35.9 (H) 27.2 - 33.4 pg   MCHC 32.9 32.0 - 36.0 g/dL   RBC 3.73 (L) 4.20 - 5.82 10e6/uL   RDW  16.1 (H) 11.0 - 14.6 %   lymph# 0.8 (L) 0.9 - 3.3 10e3/uL   MONO# 0.4 0.1 - 0.9 10e3/uL   Eosinophils Absolute 0.1 0.0 - 0.5 10e3/uL   Basophils Absolute 0.0 0.0 - 0.1 10e3/uL   NEUT% 67.3 39.0 - 75.0 %   LYMPH% 20.3 14.0 - 49.0 %   MONO% 10.8 0.0 - 14.0 %   EOS% 1.3 0.0 - 7.0 %   BASO% 0.3 0.0 - 2.0 %  TECHNOLOGIST REVIEW     Status: None   Collection Time: 10/20/14 12:28 PM  Result Value Ref Range   Technologist Review Platelet agrees with Slide Est   Comprehensive metabolic panel     Status: Abnormal   Collection Time: 10/20/14 12:29 PM  Result Value Ref Range   Sodium 140 136 - 145 mEq/L   Potassium 4.5 3.5 - 5.1 mEq/L   Chloride 110 (H) 98 - 109 mEq/L   CO2 21 (L) 22 - 29 mEq/L   Glucose 95 70 - 140 mg/dl   BUN 28.7 (H) 7.0 - 26.0 mg/dL   Creatinine 1.1 0.7 - 1.3 mg/dL   Total Bilirubin 1.10 0.20 - 1.20 mg/dL   Alkaline Phosphatase 95 40 - 150 U/L   AST 21 5 - 34 U/L   ALT 15 0 - 55 U/L   Total Protein 6.9 6.4 - 8.3 g/dL   Albumin 3.8 3.5 - 5.0 g/dL   Calcium 9.0 8.4 - 10.4 mg/dL   Anion Gap 9 3 - 11 mEq/L   EGFR 63 (L) >90 ml/min/1.73 m2    Comment: eGFR is calculated using the CKD-EPI Creatinine Equation (2009)  Smear     Status: None   Collection Time: 10/20/14 12:29 PM  Result Value Ref Range   Smear Result Smear Available   POCT INR     Status: None   Collection Time: 10/23/14  8:52 AM  Result Value Ref Range   INR 4.5   Vitamin B12     Status:  None   Collection Time: 10/26/14 10:34 AM  Result Value Ref Range   Vitamin B-12 393 211 - 911 pg/mL  CBC & Diff and Retic     Status: Abnormal   Collection Time: 10/26/14 10:34 AM  Result Value Ref Range   WBC 3.2 (L) 4.0 - 10.3 10e3/uL   NEUT# 2.2 1.5 - 6.5 10e3/uL   HGB 13.4 13.0 - 17.1 g/dL   HCT 41.2 38.4 - 49.9 %   Platelets 61 (L) 140 - 400 10e3/uL   MCV 109.3 (H) 79.3 - 98.0 fL   MCH 35.5 (H) 27.2 - 33.4 pg   MCHC 32.5 32.0 - 36.0 g/dL   RBC 3.77 (L) 4.20 - 5.82 10e6/uL   RDW 16.0 (H) 11.0 - 14.6 %   lymph# 0.7 (L) 0.9 - 3.3 10e3/uL   MONO# 0.2 0.1 - 0.9 10e3/uL   Eosinophils Absolute 0.1 0.0 - 0.5 10e3/uL   Basophils Absolute 0.0 0.0 - 0.1 10e3/uL   NEUT% 69.1 39.0 - 75.0 %   LYMPH% 22.2 14.0 - 49.0 %   MONO% 6.2 0.0 - 14.0 %   EOS% 1.9 0.0 - 7.0 %   BASO% 0.6 0.0 - 2.0 %   Retic % 2.26 (H) 0.80 - 1.80 %   Retic Ct Abs 85.20 34.80 - 93.90 10e3/uL   Immature Retic Fract 17.30 (H) 3.00 - 10.60 %  Lactate dehydrogenase     Status: Abnormal   Collection Time: 10/26/14 10:34 AM  Result Value Ref Range   LDH 260 (H) 125 - 245 U/L  Hepatitis B surface antigen     Status: None   Collection Time: 10/26/14 10:34 AM  Result Value Ref Range   Hepatitis B Surface Ag NEGATIVE NEGATIVE  Haptoglobin     Status: Abnormal   Collection Time: 10/26/14 10:34 AM  Result Value Ref Range   Haptoglobin <15 (L) 43 - 212 mg/dL  Hepatitis C antibody     Status: None   Collection Time: 10/26/14 10:34 AM  Result Value Ref Range   HCV Ab NEGATIVE NEGATIVE  Hepatitis B core antibody, total     Status: None   Collection Time: 10/26/14 10:34 AM  Result Value Ref Range   Hep B Core Total Ab NON REACTIVE NON REACTIVE  Folate, Serum     Status: None   Collection Time: 10/26/14 10:34 AM  Result Value Ref Range   Folate 5.1 ng/mL    Comment:  Reference Ranges        Deficient:       0.4 - 3.3 ng/mL        Indeterminate:   3.4 - 5.4 ng/mL        Normal:              > 5.4 ng/mL   SPEP & IFE  with QIG     Status: None   Collection Time: 11/03/14  8:41 AM  Result Value Ref Range   IgG (Immunoglobin G), Serum 1070 650 - 1600 mg/dL   IgA 244 68 - 379 mg/dL   IgM, Serum 60 41 - 251 mg/dL   Immunofix Electr Int *     Comment: No monoclonal protein identified.Reviewed by Odis Hollingshead, MD, PhD, FCAP (Electronic Signature onFile)   Total Protein, Serum Electrophoresis 6.9 6.1 - 8.1 g/dL   Albumin ELP 4.1 3.8 - 4.8 g/dL   Alpha-1-Globulin 0.3 0.2 - 0.3 g/dL   Alpha-2-Globulin 0.6 0.5 - 0.9 g/dL   Beta Globulin 0.5 0.4 - 0.6 g/dL   Beta 2 0.5 0.2 - 0.5 g/dL   Gamma Globulin 1.0 0.8 - 1.7 g/dL   Abnormal Protein Band1 NOT DET g/dL   SPE Interp. *     Comment: Normal pattern.   COMMENT (PROTEIN ELECTROPHOR) *     Comment: ---------------Serum protein electrophoresis is a useful screening procedure in thedetection of various pathophysiologic states such as inflammation,gammopathies, protein loss and other dysproteinemias.  Immunofixationelectrophoresis (IFE) is a more  sensitive technique for theidentification of M-proteins found in patients with monoclonalgammopathy of unknown significance (MGUS), amyloidosis, early ortreated myeloma or macroglobulinemia, solitary plasmacytoma orextramedullary plasmacytoma.    Abnormal Protein Band2 NOT DET g/dL   Abnormal Protein Band3 NOT DET g/dL  CBC & Diff and Retic     Status: Abnormal   Collection Time: 11/03/14 12:50 PM  Result Value Ref Range   WBC 3.5 (L) 4.0 - 10.3 10e3/uL   NEUT# 2.3 1.5 - 6.5 10e3/uL   HGB 13.6 13.0 - 17.1 g/dL   HCT 41.4 38.4 - 49.9 %   Platelets 63 (L) 140 - 400 10e3/uL   MCV 109.2 (H) 79.3 - 98.0 fL   MCH 35.9 (H) 27.2 - 33.4 pg   MCHC 32.9 32.0 - 36.0 g/dL   RBC 3.79 (L) 4.20 - 5.82 10e6/uL   RDW 15.7 (H) 11.0 - 14.6 %   lymph# 0.9 0.9 - 3.3 10e3/uL   MONO# 0.3 0.1 - 0.9 10e3/uL   Eosinophils Absolute  0.1 0.0 - 0.5 10e3/uL   Basophils Absolute 0.0 0.0 - 0.1 10e3/uL   NEUT% 64.1 39.0 - 75.0 %   LYMPH%  24.8 14.0 - 49.0 %   MONO% 8.8 0.0 - 14.0 %   EOS% 1.7 0.0 - 7.0 %   BASO% 0.6 0.0 - 2.0 %   Retic % 2.10 (H) 0.80 - 1.80 %   Retic Ct Abs 79.59 34.80 - 93.90 10e3/uL   Immature Retic Fract 14.20 (H) 3.00 - 10.60 %  Comprehensive metabolic panel     Status: Abnormal   Collection Time: 11/03/14 12:50 PM  Result Value Ref Range   Sodium 140 136 - 145 mEq/L   Potassium 4.5 3.5 - 5.1 mEq/L   Chloride 109 98 - 109 mEq/L   CO2 23 22 - 29 mEq/L   Glucose 89 70 - 140 mg/dl   BUN 23.5 7.0 - 26.0 mg/dL   Creatinine 0.8 0.7 - 1.3 mg/dL   Total Bilirubin 1.47 (H) 0.20 - 1.20 mg/dL   Alkaline Phosphatase 100 40 - 150 U/L   AST 21 5 - 34 U/L   ALT 13 0 - 55 U/L   Total Protein 7.0 6.4 - 8.3 g/dL   Albumin 4.0 3.5 - 5.0 g/dL   Calcium 9.2 8.4 - 10.4 mg/dL   Anion Gap 7 3 - 11 mEq/L   EGFR 84 (L) >90 ml/min/1.73 m2    Comment: eGFR is calculated using the CKD-EPI Creatinine Equation (2009)  Smear     Status: None   Collection Time: 11/03/14 12:50 PM  Result Value Ref Range   Smear Result Smear Available   ADAMTS13 Activity     Status: None   Collection Time: 11/03/14 12:50 PM  Result Value Ref Range   Adamts 13 Activity 137 68 - 163 % Activity    Comment: Activity levels below 10% are seen in acquired andhereditary thrombotic thrombocytopenic purpura (TTP).Not all patients with TTP will exhibit low levels ofADAMTS13 activity with this assay, i.e., post bonemarrow transplantation, drug-induced TTP,  andmutations of ADAMTS13 at the CUB domain.Recent plasma exchange or immunosuppressive therapymay raise the observed activity levels. Mild decreasesin ADAMTS13 activity are seen in a wide variety ofconditions including metastatic cancer, neonates,serious  infections and cirrhosis of the liver.This test was performed using a kit that has notbeen cleared or approved by the FDA.  Theanalytical performance characteristics of thistest have been determined by [employer], [place],  [place].   This test shouldnot be used for diagnosis without confirmation byother medically established means.For more information on this test, go tohttp://education.questdiagn[facility].com/faq/FAQ112    Additional testing REPORT     Comment: Not indicated  Lactate dehydrogenase     Status: Abnormal   Collection Time: 11/03/14 12:50 PM  Result Value Ref Range   LDH 267 (H) 125 - 245 U/L  Epstein Barr Virus DNA, Quant RTPCR     Status: None   Collection Time: 11/03/14 12:50 PM  Result Value Ref Range   Source: NOT PROVIDED    EBV DNA, QN PCR <200 copies/mL    Comment: REFERENCE RANGE: <200 copies/mL This test was developed and its analytical performancecharacteristics have been determined by [employer].It has not been cleared or approved by the [employer].  The FDA has determined that  suchclearance or approval is not necessary. This assay hasbeen validated pursuant to the CLIA regulations and isused for clinical purposes.   Direct Antiglobulin rfx Anti-C3/IgG     Status: None   Collection  Time: 11/03/14 12:50 PM  Result Value Ref Range   DAT, Polyspecific NEG NEGATIVE  Platelet Antibody Screen (Indirect)     Status: None   Collection Time: 11/03/14 12:50 PM  Result Value Ref Range   Plt Ab:GP IIb/IIIa Negative Negative   Plt Ab:GP Ia/IIa Negative Negative   Plt Ab:GP Ib/IX Negative Negative   PLT AB:HLA CLASS I Negative Negative   Interpretation REPORT     Comment: Platelet antibodies in serum/plasma are not detected.  POCT INR     Status: None   Collection Time: 11/06/14  8:49 AM  Result Value Ref Range   INR 3.8   POCT INR     Status: None   Collection Time: 11/20/14  8:41 AM  Result Value Ref Range   INR 2.7   CBC & Diff and Retic     Status: Abnormal   Collection Time: 12/01/14 10:47 AM  Result Value Ref Range   WBC 3.2 (L) 4.0 - 10.3 10e3/uL   NEUT# 2.1 1.5 - 6.5 10e3/uL   HGB 13.5 13.0 - 17.1 g/dL   HCT 41.5 38.4 - 49.9 %   Platelets 58 (L) 140  - 400 10e3/uL   MCV 110.7 (H) 79.3 - 98.0 fL   MCH 36.0 (H) 27.2 - 33.4 pg   MCHC 32.5 32.0 - 36.0 g/dL   RBC 3.75 (L) 4.20 - 5.82 10e6/uL   RDW 15.5 (H) 11.0 - 14.6 %   lymph# 0.6 (L) 0.9 - 3.3 10e3/uL   MONO# 0.3 0.1 - 0.9 10e3/uL   Eosinophils Absolute 0.1 0.0 - 0.5 10e3/uL   Basophils Absolute 0.0 0.0 - 0.1 10e3/uL   NEUT% 67.6 39.0 - 75.0 %   LYMPH% 18.7 14.0 - 49.0 %   MONO% 10.8 0.0 - 14.0 %   EOS% 1.9 0.0 - 7.0 %   BASO% 1.0 0.0 - 2.0 %   nRBC 0 0 - 0 %   Retic % 2.29 (H) 0.80 - 1.80 %   Retic Ct Abs 85.88 34.80 - 93.90 10e3/uL   Immature Retic Fract 14.70 (H) 3.00 - 10.60 %  Smear     Status: None   Collection Time: 12/01/14 10:47 AM  Result Value Ref Range   Smear Result Smear Available   Comprehensive metabolic panel     Status: Abnormal   Collection Time: 12/01/14 10:48 AM  Result Value Ref Range   Sodium 141 136 - 145 mEq/L   Potassium 4.4 3.5 - 5.1 mEq/L   Chloride 110 (H) 98 - 109 mEq/L   CO2 23 22 - 29 mEq/L   Glucose 104 70 - 140 mg/dl   BUN 17.6 7.0 - 26.0 mg/dL   Creatinine 0.8 0.7 - 1.3 mg/dL   Total Bilirubin 1.51 (H) 0.20 - 1.20 mg/dL   Alkaline Phosphatase 90 40 - 150 U/L   AST 19 5 - 34 U/L   ALT 14 0 - 55 U/L   Total Protein 6.9 6.4 - 8.3 g/dL   Albumin 3.9 3.5 - 5.0 g/dL   Calcium 9.4 8.4 - 10.4 mg/dL   Anion Gap 8 3 - 11 mEq/L   EGFR 84 (L) >90 ml/min/1.73 m2    Comment: eGFR is calculated using the CKD-EPI Creatinine Equation (2009)  this is grossly: No anterior no more than expected  RADIOGRAPHIC STUDIES: I have personally reviewed the radiological images as listed and agreed with the findings in the report. No results found.  ASSESSMENT & PLAN   1) Thrombocytopenia. Moderate.  Platelet counts are stable and the same ballpark at 58,000. No evidence of pseudothrombocytopenia on my personal review of his peripheral blood smear. No platelet satellitism. His thrombocytopenia is likely multifactorial including mild hypersplenism (due to  splenomegaly) + valve related hemolysis. + cannot rule out ITP element. No evidence of TTP/HUS spectrum disorders based on peripheral blood smear/disease presentation and tempo and normal Adam TS 13 level.  His immature platelet fraction is 2.9 which is in low and suggests insufficient bone marrow response as well. This could be due to a subclinical viral infection or possible MDS. He has some pelgeroid neutrophils on his peripheral blood smear that are suggestive of possible MDS. Hepatitis B, and C serologies negative. No evidence of a lymphoproliferative syndrome on peripheral blood smear or clinical examination. SPEP showed no monoclonal protein.  Plan -Avoid medications causing thrombocytopenia. -If platelet counts drop below 50,000 might need to limit the INR fluctuations on Coumadin from 2-3 more   strictly. -Absolute alcohol cessation -Avoid NSAIDS and other medications known to cause thrombocytopenia especially ranitidine and other  H2 blockers, sulfa drugs, PPIs. -Reinforced and wrote down for the patient to take vitamin B12 2000 g sublingually daily and  Folic acid 1 mg by mouth daily.   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 Patient has no clinical evidence of hemoglobinuria. Peripheral blood smear showed minimal rare schistocytes. No micro-spherocytes to suggest autoimmune hemolysis.Coombs negative. 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 PLAN -aggressive B12 replacement - folic acid 1-2 mg po daily to maintain erythropoiesis in the setting of increased demand  from chronic hemolysis. -Bone marrow examination if worsening anemia or other cytopenia.  Return to clinic with Dr. Irene Limbo in 8weeks with repeat CBC, CMP. Patient counseled to call earlier if any new acute concerns or questions  arise.  I appreciate the privilege of taking part in the care of this wonderful gentleman.  Total time spent 25 minutes more than 50% of the time in direct patient care counseling and coordination of care.  Andrew Lone MD [place] [facility] Physician [facility]  (Office):       [phone] (Work cell):  [phone] (Fax):           [phone]

## 2014-12-11 ENCOUNTER — Ambulatory Visit (INDEPENDENT_AMBULATORY_CARE_PROVIDER_SITE_OTHER): Payer: Medicare Other | Admitting: *Deleted

## 2014-12-11 DIAGNOSIS — I359 Nonrheumatic aortic valve disorder, unspecified: Secondary | ICD-10-CM | POA: Diagnosis not present

## 2014-12-11 DIAGNOSIS — Z5181 Encounter for therapeutic drug level monitoring: Secondary | ICD-10-CM

## 2014-12-11 DIAGNOSIS — I482 Chronic atrial fibrillation, unspecified: Secondary | ICD-10-CM

## 2014-12-11 LAB — POCT INR: INR: 2.9

## 2015-01-01 ENCOUNTER — Ambulatory Visit (INDEPENDENT_AMBULATORY_CARE_PROVIDER_SITE_OTHER): Payer: Medicare Other | Admitting: Interventional Cardiology

## 2015-01-01 ENCOUNTER — Encounter: Payer: Self-pay | Admitting: Interventional Cardiology

## 2015-01-01 VITALS — BP 128/74 | HR 77 | Ht 74.0 in | Wt 265.1 lb

## 2015-01-01 DIAGNOSIS — I10 Essential (primary) hypertension: Secondary | ICD-10-CM

## 2015-01-01 DIAGNOSIS — I70219 Atherosclerosis of native arteries of extremities with intermittent claudication, unspecified extremity: Secondary | ICD-10-CM

## 2015-01-01 DIAGNOSIS — I739 Peripheral vascular disease, unspecified: Secondary | ICD-10-CM

## 2015-01-01 DIAGNOSIS — I482 Chronic atrial fibrillation, unspecified: Secondary | ICD-10-CM

## 2015-01-01 DIAGNOSIS — I359 Nonrheumatic aortic valve disorder, unspecified: Secondary | ICD-10-CM | POA: Diagnosis not present

## 2015-01-01 DIAGNOSIS — I5032 Chronic diastolic (congestive) heart failure: Secondary | ICD-10-CM | POA: Diagnosis not present

## 2015-01-01 DIAGNOSIS — Z5181 Encounter for therapeutic drug level monitoring: Secondary | ICD-10-CM

## 2015-01-01 NOTE — Progress Notes (Signed)
Cardiology Office Note   Date:  01/01/2015   ID:  Kenon Paik, DOB 08/25/1935, MRN [id number]  PCP:  Antony Blackbird, MD  Cardiologist:  [name], MD   No chief complaint on file.     History of Present Illness: [name] is a 79 y.o. male who presents for with mechanical aortic valve, chronic atrial fibrillation, chronic combined systolic and diastolic heart failure, chronic Coumadin therapy, and prior CABG. There is a history of left carotid endarterectomy as well.  Since the last visit, [place] has been relatively sedentary. He has some dyspnea on exertion. He denies orthopnea, PND, and peripheral edema. His problem is with excessive walking in physical activity of not being as physically fit and noticing decreased endurance. He denies angina. There've been no recurrent neurological deficits. Appetite is been stable. He denies chills and fever.    Past Medical History  Diagnosis Date  . Hx of CABG   . Aortic stenosis   . Chronic atrial fibrillation   . CHF with unknown LVEF     47 %  . Subclavian bypass stenosis   . Rheumatic fever   . Vitamin B12 deficiency   . OSA (obstructive sleep apnea)   . Hypertension   . Carotid artery disease 1994    s/p left carotid endarerectomy   . Left ventricular dysfunction 2007    LVEF 47%     Past Surgical History  Procedure Laterality Date  . Heart bypass      Done in [place]  . Cardiac valve surgery  1994    replaced due to aortic stenosis, [employer]. Jude mechanical prostesis  . Coronary artery bypass graft  1994    w SVG to RCA and SVG to circumflex  . Carotid endarterectomy Left 1997    subclavian bypass Done in [place]     Current Outpatient Prescriptions  Medication Sig Dispense Refill  . carvedilol (COREG) 25 MG tablet Take 1 tablet (25 mg total) by mouth 2 (two) times daily with a meal. 180 tablet 0  . Cyanocobalamin (VITAMIN B12) 3000 MCG/ML LIQD Place 2,000 mcg under the tongue daily.    . enalapril  (VASOTEC) 20 MG tablet TAKE 1 TABLET BY MOUTH EVERY DAY 90 tablet 1  . ezetimibe-simvastatin (VYTORIN) 10-20 MG per tablet Take 1 tablet by mouth at bedtime. 90 tablet 2  . fluocinonide cream (LIDEX) AB-123456789 % Apply 1 application topically 2 (two) times daily.     . folic acid (FOLVITE) 1 MG tablet Take 1 tablet (1 mg total) by mouth daily.    . furosemide (LASIX) 40 MG tablet Take 40 mg by mouth daily as needed (swelling).     Marland Kitchen glucose blood (ONE TOUCH ULTRA TEST) test strip USE AS DIRECTED TO TEST BLOOD GLUCOSE  EVERY OTHER DAY DX: 250.00 50 each 2  . metFORMIN (GLUCOPHAGE-XR) 750 MG 24 hr tablet TAKE 3 TABLETS BY MOUTH EVERY DAY 270 tablet 1  . NON FORMULARY BIPAP    . NON FORMULARY ONE TOUCH ULTRA TEST STRIPS  AND LANCETS    . ONETOUCH DELICA LANCETS FINE MISC Use to obtain a blood specimen every other day Dx code 250.00 100 each 1  . potassium chloride (KLOR-CON M10) 10 MEQ tablet Take 1 tablet (10 mEq total) by mouth daily. 30 tablet 11  . PROAIR HFA 108 (90 BASE) MCG/ACT inhaler Inhale 2 puffs into the lungs every 6 (six) hours as needed. For chest congestion    . warfarin (COUMADIN) 5  MG tablet TAKE AS DIRECTED BY COUMADIN CLINIC 120 tablet 1   No current facility-administered medications for this visit.    Allergies:   Rifampin and Vancomycin    Social History:  The patient  reports that he quit smoking about 22 years ago. His smoking use included Cigarettes. He has a 30 pack-year smoking history. He has never used smokeless tobacco. He reports that he drinks alcohol. He reports that he does not use illicit drugs.   Family History:  The patient's family history includes Cancer in his father; Hypertension in his mother.    ROS:  Please see the history of present illness.   Otherwise, review of systems are positive for low platelet count. Has PAD with Dr. Geryl Councilman evaluating decreased blood flow in the right lower extremity. The low platelet count is being followed by Dr. Mervin Kung.   All other  systems are reviewed and negative.    PHYSICAL EXAM: VS:  BP 128/74 mmHg  Pulse 77  Ht 6\' 2"  (1.88 m)  Wt 120.258 kg (265 lb 1.9 oz)  BMI 34.03 kg/m2  SpO2 92% , BMI Body mass index is 34.03 kg/(m^2). GEN: Well nourished, well developed, in no acute distress HEENT: normal Neck: no JVD, carotid bruits, or masses Cardiac: IIRR.  There soft systolic 1 of 6 murmur at right upper sternal border. No diastolic murmurs heard. Mechanical opening and closing valve sounds are heard. No rub, or gallop. There is trace bilateral ankle edema. Somewhat tender medication of the skin is noted. Respiratory:  clear to auscultation bilaterally, normal work of breathing. GI: soft, nontender, nondistended, + BS MS: no deformity or atrophy Skin: warm and dry, no rash Neuro:  Strength and sensation are intact Psych: euthymic mood, full affect   EKG:  EKG is ordered today. The ekg reveals atrial fibrillation with controlled rate is 77 bpm and otherwise unremarkable.   Recent Labs: 12/01/2014: ALT 14; BUN 17.6; Creatinine 0.8; HGB 13.5; Platelets 58*; Potassium 4.4; Sodium 141    Lipid Panel    Component Value Date/Time   CHOL 118 08/11/2013 0841   TRIG 58.0 08/11/2013 0841   HDL 48.60 08/11/2013 0841   CHOLHDL 2 08/11/2013 0841   VLDL 11.6 08/11/2013 0841   LDLCALC 58 08/11/2013 0841      Wt Readings from Last 3 Encounters:  01/01/15 120.258 kg (265 lb 1.9 oz)  12/01/14 121.292 kg (267 lb 6.4 oz)  11/20/14 119.84 kg (264 lb 3.2 oz)      Other studies Reviewed: Additional studies/ records that were reviewed today include: Reviewed vascular data. Reviewed hematologic data.. The findings include major finding is that of thrombocytopenia with platelet levels in the 60,000 range..    ASSESSMENT AND PLAN:  1. Aortic valve disorder Normal auscultation and normal by echo one year ago.  2. Chronic atrial fibrillation Controlled rate with otherwise normal EKG.  3. Chronic diastolic heart  failure No evidence of volume overload.  4. PAD (peripheral artery disease) Asymptomatic.  5. Essential hypertension Very well controlled.  6. Encounter for therapeutic drug monitoring No bleeding on Coumadin but increasing risk his platelet count becomes an issue.    Current medicines are reviewed at length with the patient today.  The patient has the following concerns regarding medicines: None..  The following changes/actions have been instituted:    Aerobic activity  Labs/ tests ordered today include:  No orders of the defined types were placed in this encounter.     Disposition:   FU with  HS in 1 year  Signed, [name], MD  01/01/2015 4:30 PM    [address], [place], [place]  [address] Phone: [phone]; Fax: [phone]

## 2015-01-01 NOTE — Patient Instructions (Signed)
Medication Instructions:  Your physician recommends that you continue on your current medications as directed. Please refer to the Current Medication list given to you today.   Labwork: None ordered  Testing/Procedures: None ordered  Follow-Up: Your physician wants you to follow-up in: 1 year with Dr.Smith You will receive a reminder letter in the mail two months in advance. If you don't receive a letter, please call our office to schedule the follow-up appointment.   Any Other Special Instructions Will Be Listed Below (If Applicable). Your physician discussed the importance of regular exercise and recommended that you start or continue a regular exercise program for good health.

## 2015-01-04 ENCOUNTER — Ambulatory Visit (INDEPENDENT_AMBULATORY_CARE_PROVIDER_SITE_OTHER): Payer: Medicare Other

## 2015-01-04 ENCOUNTER — Ambulatory Visit (INDEPENDENT_AMBULATORY_CARE_PROVIDER_SITE_OTHER): Payer: Medicare Other | Admitting: Cardiology

## 2015-01-04 ENCOUNTER — Encounter: Payer: Self-pay | Admitting: Cardiology

## 2015-01-04 VITALS — BP 130/80 | HR 72 | Wt 266.1 lb

## 2015-01-04 DIAGNOSIS — E669 Obesity, unspecified: Secondary | ICD-10-CM

## 2015-01-04 DIAGNOSIS — I10 Essential (primary) hypertension: Secondary | ICD-10-CM

## 2015-01-04 DIAGNOSIS — I359 Nonrheumatic aortic valve disorder, unspecified: Secondary | ICD-10-CM | POA: Diagnosis not present

## 2015-01-04 DIAGNOSIS — I70219 Atherosclerosis of native arteries of extremities with intermittent claudication, unspecified extremity: Secondary | ICD-10-CM

## 2015-01-04 DIAGNOSIS — I482 Chronic atrial fibrillation, unspecified: Secondary | ICD-10-CM

## 2015-01-04 DIAGNOSIS — Z5181 Encounter for therapeutic drug level monitoring: Secondary | ICD-10-CM

## 2015-01-04 DIAGNOSIS — G4733 Obstructive sleep apnea (adult) (pediatric): Secondary | ICD-10-CM

## 2015-01-04 LAB — POCT INR: INR: 2.7

## 2015-01-04 NOTE — Progress Notes (Signed)
Cardiology Office Note   Date:  01/04/2015   ID:  [name], DOB 10/02/1935, MRN [id number]  PCP:  Antony Blackbird, MD    Chief Complaint  Patient presents with  . Sleep Apnea      History of Present Illness: [name] is a 79 y.o. male with a history of OSA, obesity and HTN who presents today for followup. He is doing well. He tolerates his BiPAP without any problems. He feels rested in the am and has no daytime sleepiness. He tolerates the nasal mask without any problems but does snore some.  He says that the chin strap does not hold his mouth completely shut.  His weight is stable but he does not get an aerobic exercise.   Past Medical History  Diagnosis Date  . Hx of CABG   . Aortic stenosis   . Chronic atrial fibrillation ([place])   . CHF with unknown LVEF (HCC)     47 %  . Subclavian bypass stenosis ([place])   . Rheumatic fever   . Vitamin B12 deficiency   . OSA (obstructive sleep apnea)   . Hypertension   . Carotid artery disease ([place]) 1994    s/p left carotid endarerectomy   . Left ventricular dysfunction 2007    LVEF 47%     Past Surgical History  Procedure Laterality Date  . Heart bypass      Done in [place]  . Cardiac valve surgery  1994    replaced due to aortic stenosis, St. Jude mechanical prostesis  . Coronary artery bypass graft  1994    w SVG to RCA and SVG to circumflex  . Carotid endarterectomy Left 1997    subclavian bypass Done in [place]     Current Outpatient Prescriptions  Medication Sig Dispense Refill  . carvedilol (COREG) 25 MG tablet Take 1 tablet (25 mg total) by mouth 2 (two) times daily with a meal. 180 tablet 0  . Cyanocobalamin (VITAMIN B12) 3000 MCG/ML LIQD Place 2,000 mcg under the tongue daily.    . enalapril (VASOTEC) 20 MG tablet TAKE 1 TABLET BY MOUTH EVERY DAY 90 tablet 1  . ezetimibe-simvastatin (VYTORIN) 10-20 MG per tablet Take 1 tablet by mouth at bedtime. 90 tablet 2  . fluocinonide cream (LIDEX)  [id number] % Apply 1 application topically 2 (two) times daily.     . folic acid (FOLVITE) 1 MG tablet Take 1 tablet (1 mg total) by mouth daily.    . furosemide (LASIX) 40 MG tablet Take 40 mg by mouth daily as needed (swelling).     Marland Kitchen glucose blood (ONE TOUCH ULTRA TEST) test strip USE AS DIRECTED TO TEST BLOOD GLUCOSE  EVERY OTHER DAY DX: 250.00 50 each 2  . metFORMIN (GLUCOPHAGE-XR) 750 MG 24 hr tablet TAKE 3 TABLETS BY MOUTH EVERY DAY 270 tablet 1  . NON FORMULARY BIPAP    . NON FORMULARY ONE TOUCH ULTRA TEST STRIPS  AND LANCETS    . ONETOUCH DELICA LANCETS FINE MISC Use to obtain a blood specimen every other day Dx code 250.00 100 each 1  . potassium chloride (KLOR-CON M10) 10 MEQ tablet Take 1 tablet (10 mEq total) by mouth daily. 30 tablet 11  . PROAIR HFA 108 (90 BASE) MCG/ACT inhaler Inhale 2 puffs into the lungs every 6 (six) hours as needed. For chest congestion    . warfarin (COUMADIN) 5 MG tablet  TAKE AS DIRECTED BY COUMADIN CLINIC 120 tablet 1   No current facility-administered medications for this visit.    Allergies:   Rifampin and Vancomycin    Social History:  The patient  reports that he quit smoking about 22 years ago. His smoking use included Cigarettes. He has a 30 pack-year smoking history. He has never used smokeless tobacco. He reports that he drinks alcohol. He reports that he does not use illicit drugs.   Family History:  The patient's family history includes Cancer in his father; Hypertension in his mother.    ROS:  Please see the history of present illness.   Otherwise, review of systems are positive for none.   All other systems are reviewed and negative.    PHYSICAL EXAM: VS:  BP 130/80 mmHg  Pulse 72  Wt 266 lb 1.9 oz (120.711 kg) , BMI Body mass index is 34.15 kg/(m^2). GEN: Well nourished, well developed, in no acute distress HEENT: normal Neck: no JVD, carotid bruits, or masses Cardiac: RRR; no murmurs, rubs, or gallops.  Trace edema Respiratory:  clear  to auscultation bilaterally, normal work of breathing GI: soft, nontender, nondistended, + BS MS: no deformity or atrophy Skin: warm and dry, no rash Neuro:  Strength and sensation are intact Psych: euthymic mood, full affect   EKG:  EKG is not ordered today.    Recent Labs: 12/01/2014: ALT 14; BUN 17.6; Creatinine 0.8; HGB 13.5; Platelets 58*; Potassium 4.4; Sodium 141    Lipid Panel    Component Value Date/Time   CHOL 118 08/11/2013 0841   TRIG 58.0 08/11/2013 0841   HDL 48.60 08/11/2013 0841   CHOLHDL 2 08/11/2013 0841   VLDL 11.6 08/11/2013 0841   LDLCALC 58 08/11/2013 0841      Wt Readings from Last 3 Encounters:  01/04/15 266 lb 1.9 oz (120.711 kg)  01/01/15 265 lb 1.9 oz (120.258 kg)  12/01/14 267 lb 6.4 oz (121.292 kg)    ASSESSMENT AND PLAN:  1.  OSA on BiPAP ASV. His d/l today showed an AHI 2.5/hr on 16/12cm H2O.He is 100% compliant in using more than 4 hours nightly. 2.  Obesity- I have encouraged him to try to get into a routine exercise program 3.  HTN well controlled - continue Coreg/enalapril   Current medicines are reviewed at length with the patient today.  The patient does not have concerns regarding medicines.  The following changes have been made:  no change  Labs/ tests ordered today: See above Assessment and Plan No orders of the defined types were placed in this encounter.     Disposition:   FU with me in 1 year  Signed, Sueanne Margarita, MD  01/04/2015 8:38 AM    [address], [place], [place]  [address] Phone: [phone]; Fax: [phone]

## 2015-01-04 NOTE — Patient Instructions (Signed)

## 2015-01-17 ENCOUNTER — Other Ambulatory Visit: Payer: Self-pay | Admitting: Interventional Cardiology

## 2015-01-19 ENCOUNTER — Encounter: Payer: Self-pay | Admitting: Cardiology

## 2015-02-01 ENCOUNTER — Ambulatory Visit (HOSPITAL_BASED_OUTPATIENT_CLINIC_OR_DEPARTMENT_OTHER): Payer: Medicare Other

## 2015-02-01 ENCOUNTER — Telehealth: Payer: Self-pay | Admitting: Hematology

## 2015-02-01 ENCOUNTER — Encounter: Payer: Self-pay | Admitting: Hematology

## 2015-02-01 ENCOUNTER — Ambulatory Visit (HOSPITAL_BASED_OUTPATIENT_CLINIC_OR_DEPARTMENT_OTHER): Payer: Medicare Other | Admitting: Hematology

## 2015-02-01 VITALS — BP 121/92 | HR 64 | Temp 98.3°F | Resp 18 | Ht 74.0 in | Wt 266.8 lb

## 2015-02-01 DIAGNOSIS — I70219 Atherosclerosis of native arteries of extremities with intermittent claudication, unspecified extremity: Secondary | ICD-10-CM | POA: Diagnosis not present

## 2015-02-01 DIAGNOSIS — D7589 Other specified diseases of blood and blood-forming organs: Secondary | ICD-10-CM | POA: Diagnosis not present

## 2015-02-01 DIAGNOSIS — D696 Thrombocytopenia, unspecified: Secondary | ICD-10-CM

## 2015-02-01 LAB — CBC & DIFF AND RETIC
BASO%: 0.5 % (ref 0.0–2.0)
BASOS ABS: 0 10*3/uL (ref 0.0–0.1)
EOS ABS: 0.1 10*3/uL (ref 0.0–0.5)
EOS%: 2.3 % (ref 0.0–7.0)
HEMATOCRIT: 42 % (ref 38.4–49.9)
HEMOGLOBIN: 13.5 g/dL (ref 13.0–17.1)
IMMATURE RETIC FRACT: 12.1 % — AB (ref 3.00–10.60)
LYMPH#: 0.9 10*3/uL (ref 0.9–3.3)
LYMPH%: 21.6 % (ref 14.0–49.0)
MCH: 35.1 pg — ABNORMAL HIGH (ref 27.2–33.4)
MCHC: 32.1 g/dL (ref 32.0–36.0)
MCV: 109.1 fL — ABNORMAL HIGH (ref 79.3–98.0)
MONO#: 0.3 10*3/uL (ref 0.1–0.9)
MONO%: 7.9 % (ref 0.0–14.0)
NEUT#: 2.7 10*3/uL (ref 1.5–6.5)
NEUT%: 67.7 % (ref 39.0–75.0)
PLATELETS: 67 10*3/uL — AB (ref 140–400)
RBC: 3.85 10*6/uL — ABNORMAL LOW (ref 4.20–5.82)
RDW: 14.9 % — AB (ref 11.0–14.6)
RETIC %: 1.96 % — AB (ref 0.80–1.80)
RETIC CT ABS: 75.46 10*3/uL (ref 34.80–93.90)
WBC: 3.9 10*3/uL — ABNORMAL LOW (ref 4.0–10.3)

## 2015-02-01 LAB — COMPREHENSIVE METABOLIC PANEL (CC13)
ALT: 13 U/L (ref 0–55)
ANION GAP: 6 meq/L (ref 3–11)
AST: 18 U/L (ref 5–34)
Albumin: 4 g/dL (ref 3.5–5.0)
Alkaline Phosphatase: 94 U/L (ref 40–150)
BILIRUBIN TOTAL: 1.19 mg/dL (ref 0.20–1.20)
BUN: 22.3 mg/dL (ref 7.0–26.0)
CALCIUM: 9.8 mg/dL (ref 8.4–10.4)
CHLORIDE: 108 meq/L (ref 98–109)
CO2: 26 mEq/L (ref 22–29)
CREATININE: 1 mg/dL (ref 0.7–1.3)
EGFR: 74 mL/min/{1.73_m2} — ABNORMAL LOW (ref >90)
Glucose: 106 mg/dl (ref 70–140)
Potassium: 5.2 mEq/L — ABNORMAL HIGH (ref 3.5–5.1)
Sodium: 140 mEq/L (ref 136–145)
TOTAL PROTEIN: 7.2 g/dL (ref 6.4–8.3)

## 2015-02-01 NOTE — Telephone Encounter (Signed)
Gave patient avs report and appointments for December  °

## 2015-02-03 NOTE — Progress Notes (Signed)
Called pt to inform him plt count is stable at this time, no additional treatment needed.  Pt to follow up with Dr. Irene Limbo in 2 months.  Pt verbalized understanding.

## 2015-02-09 NOTE — Progress Notes (Signed)
[facility]   Hematology oncology progress note  Date of service: .02/01/2015   Patient Care Team: Antony Blackbird, MD as PCP - General (Family Medicine) Brunetta Genera, MD as Consulting Physician (Hematology) Belva Crome, MD as Consulting Physician (Cardiology)  CHIEF COMPLAINTS: follow-up for macrocytosis and thrombocytopenia.  HISTORY OF PRESENTING ILLNESS: Please see my initial note for details on initial presentation.  Interval history  [name] is here for scheduled follow-up regarding his thrombocytopenia. He notes no acute concerns with bleeding. No petechiae. No other acute new concerns. Has enjoyed a good quality of life.. Coumadin levels have been stable.no other significant medication changes.    MEDICAL HISTORY:  Past Medical History  Diagnosis Date  . Hx of CABG   . Aortic stenosis   . Chronic atrial fibrillation ([place])   . CHF with unknown LVEF (HCC)     47 %  . Subclavian bypass stenosis ([place])   . Rheumatic fever   . Vitamin B12 deficiency   . OSA (obstructive sleep apnea)   . Hypertension   . Carotid artery disease ([place]) 1994    s/p left carotid endarerectomy   . Left ventricular dysfunction 2007    LVEF 47%     SURGICAL HISTORY: Past Surgical History  Procedure Laterality Date  . Heart bypass      Done in [place]  . Cardiac valve surgery  1994    replaced due to aortic stenosis, St. Jude mechanical prostesis  . Coronary artery bypass graft  1994    w SVG to RCA and SVG to circumflex  . Carotid endarterectomy Left 1997    subclavian bypass Done in [place]    SOCIAL HISTORY: Social History   Social History  . Marital Status: Married    Spouse Name: N/A  . Number of Children: N/A  . Years of Education: N/A   Occupational History  . Not on file.   Social History Main Topics  . Smoking status: Former Smoker -- 1.00 packs/day for 30 years    Types: Cigarettes    Quit date: 04/03/1992  . Smokeless tobacco: Never  Used  . Alcohol Use: Yes     Comment: 4-6 ounces of wine daily  . Drug Use: No  . Sexual Activity: Not on file   Other Topics Concern  . Not on file   Social History Narrative    FAMILY HISTORY: Family History  Problem Relation Age of Onset  . Cancer Father     lung   . Hypertension Mother     ALLERGIES:  is allergic to rifampin and vancomycin.  MEDICATIONS:  Current Outpatient Prescriptions  Medication Sig Dispense Refill  . carvedilol (COREG) 25 MG tablet Take 1 tablet (25 mg total) by mouth 2 (two) times daily with a meal. 180 tablet 3  . Cyanocobalamin (VITAMIN B12) 3000 MCG/ML LIQD Place 2,000 mcg under the tongue daily.    . enalapril (VASOTEC) 20 MG tablet TAKE 1 TABLET BY MOUTH EVERY DAY 90 tablet 1  . ezetimibe-simvastatin (VYTORIN) 10-20 MG per tablet Take 1 tablet by mouth at bedtime. 90 tablet 2  . fluocinonide cream (LIDEX) 2.67 % Apply 1 application topically 2 (two) times daily.     . folic acid (FOLVITE) 1 MG tablet Take 1 tablet (1 mg total) by mouth daily.    . furosemide (LASIX) 40 MG tablet Take 40 mg by mouth daily as needed (swelling).     Marland Kitchen glucose blood (ONE TOUCH ULTRA TEST) test  strip USE AS DIRECTED TO TEST BLOOD GLUCOSE  EVERY OTHER DAY DX: 250.00 50 each 2  . metFORMIN (GLUCOPHAGE-XR) 750 MG 24 hr tablet TAKE 3 TABLETS BY MOUTH EVERY DAY 270 tablet 1  . NON FORMULARY BIPAP    . NON FORMULARY ONE TOUCH ULTRA TEST STRIPS  AND LANCETS    . ONETOUCH DELICA LANCETS FINE MISC Use to obtain a blood specimen every other day Dx code 250.00 100 each 1  . potassium chloride (KLOR-CON M10) 10 MEQ tablet Take 1 tablet (10 mEq total) by mouth daily. 30 tablet 11  . PROAIR HFA 108 (90 BASE) MCG/ACT inhaler Inhale 2 puffs into the lungs every 6 (six) hours as needed. For chest congestion    . warfarin (COUMADIN) 5 MG tablet TAKE AS DIRECTED BY COUMADIN CLINIC 120 tablet 1   No current facility-administered medications for this visit.    REVIEW OF SYSTEMS:    Constitutional: Denies fevers, chills or abnormal night sweats Eyes: Denies blurriness of vision, double vision or watery eyes Ears, nose, mouth, throat, and face: Denies mucositis or sore throat Respiratory: Denies cough, dyspnea or wheezes Cardiovascular: Denies palpitation, chest discomfort or lower extremity swelling Gastrointestinal:  Denies nausea, heartburn or change in bowel habits Skin: Denies abnormal skin rashes Lymphatics: Denies new lymphadenopathy or easy bruising Neurological:Denies numbness, tingling or new weaknesses Behavioral/Psych: Mood is stable, no new changes  All other systems were reviewed with the patient and are negative.  PHYSICAL EXAMINATION: ECOG PERFORMANCE STATUS: 1 - Symptomatic but completely ambulatory  Filed Vitals:   02/01/15 0959  BP: 121/92  Pulse: 64  Temp: 98.3 F (36.8 C)  Resp: 18   Filed Weights   02/01/15 0959  Weight: 266 lb 12.8 oz (121.02 kg)    GENERAL:alert, no distress and comfortable SKIN: skin color, texture, turgor are normal, no rashes no petechiae. EYES: normal, conjunctiva are pink and non-injected, sclera clear OROPHARYNX:no exudate, no erythema and lips, buccal mucosa, and tongue normal  NECK: supple, thyroid normal size, non-tender, without nodularity LYMPH:  no palpable lymphadenopathy in the cervical, axillary or inguinal LUNGS: clear to auscultation and percussion with normal breathing effort HEART: regular rate & rhythm and no murmurs and no lower extremity edema.clear click of his metallic aortic valve noted.  ABDOMEN:abdomen soft, non-tender and normal bowel sounds Musculoskeletal:no cyanosis of digits and no clubbing  PSYCH: alert & oriented x 3 with fluent speech NEURO: no focal motor/sensory deficits  LABORATORY DATA:   . CBC Latest Ref Rng 02/01/2015 12/01/2014 11/03/2014  WBC 4.0 - 10.3 10e3/uL 3.9(L) 3.2(L) 3.5(L)  Hemoglobin 13.0 - 17.1 g/dL 13.5 13.5 13.6  Hematocrit 38.4 - 49.9 % 42.0 41.5 41.4   Platelets 140 - 400 10e3/uL 67(L) 58(L) 63(L)    . CMP Latest Ref Rng 02/01/2015 12/01/2014 11/03/2014  Glucose 70 - 140 mg/dl 106 104 89  BUN 7.0 - 26.0 mg/dL 22.3 17.6 23.5  Creatinine 0.7 - 1.3 mg/dL 1.0 0.8 0.8  Sodium 136 - 145 mEq/L 140 141 140  Potassium 3.5 - 5.1 mEq/L 5.2(H) 4.4 4.5  Chloride 96 - 112 mEq/L - - -  CO2 22 - 29 mEq/L 26 23 23   Calcium 8.4 - 10.4 mg/dL 9.8 9.4 9.2  Total Protein 6.4 - 8.3 g/dL 7.2 6.9 7.0  Total Bilirubin 0.20 - 1.20 mg/dL 1.19 1.51(H) 1.47(H)  Alkaline Phos 40 - 150 U/L 94 90 100  AST 5 - 34 U/L 18 19 21   ALT 0 - 55 U/L 13 14  13    RADIOGRAPHIC STUDIES: I have personally reviewed the radiological images as listed and agreed with the findings in the report. No results found.  ASSESSMENT & PLAN   1) Thrombocytopenia. Moderate. Platelet counts are stable and the same ballpark at [facility].  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 + cannot rule out ITP element. Could have possible element of MDS. He has some pelgeroid neutrophils on his peripheral blood smear that are suggestive of possible MDS. Hepatitis B, and C serologies negative. No evidence of a lymphoproliferative syndrome on peripheral blood smear or clinical examination. SPEP showed no monoclonal protein.  Plan -Avoid medications causing thrombocytopenia. -If platelet counts drop below 50,000 might need to limit the INR fluctuations on Coumadin from 2-3 more  strictly. -Absolute alcohol cessation -Avoid NSAIDS and other medications known to cause thrombocytopenia especially ranitidine and other  H2 blockers, sulfa drugs, PPIs. -continue vitamin B12 2000 g sublingually daily and Folic acid 1 mg by mouth daily. -if worsening cytopenias might consider a bone marrow biopsy.  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 Has somewhat elevated  LDH and low haptoglobin confirms the presence of low-grade hemolysis. Patient has no clinical evidence of hemoglobinuria. Peripheral blood smear showed minimal rare schistocytes. No micro-spherocytes to suggest autoimmune hemolysis.Coombs negative. 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 PLAN -aggressive B12 replacement - folic acid 1-2 mg po daily to maintain erythropoiesis in the setting of increased demand  from chronic hemolysis. -Bone marrow examination if worsening anemia or other cytopenia.  Return to clinic with Dr. Irene Limbo in 8weeks with repeat CBC, CMP. Patient counseled to call earlier if any new acute concerns or questions arise.  I appreciate the privilege of taking part in the care of this wonderful gentleman.  Total time spent 15 minutes more than 50% of the time in direct patient care counseling and coordination of care.  Sullivan Lone MD [place] [facility]ology/Oncology Physician [facility]  (Office):       [phone] (Work cell):  [phone] (Fax):           [phone]

## 2015-02-10 ENCOUNTER — Other Ambulatory Visit (INDEPENDENT_AMBULATORY_CARE_PROVIDER_SITE_OTHER): Payer: Medicare Other

## 2015-02-10 DIAGNOSIS — E119 Type 2 diabetes mellitus without complications: Secondary | ICD-10-CM | POA: Diagnosis not present

## 2015-02-10 DIAGNOSIS — E118 Type 2 diabetes mellitus with unspecified complications: Secondary | ICD-10-CM

## 2015-02-10 LAB — COMPREHENSIVE METABOLIC PANEL
ALBUMIN: 4.2 g/dL (ref 3.5–5.2)
ALT: 11 U/L (ref 0–53)
AST: 17 U/L (ref 0–37)
Alkaline Phosphatase: 88 U/L (ref 39–117)
BUN: 23 mg/dL (ref 6–23)
CHLORIDE: 105 meq/L (ref 96–112)
CO2: 24 meq/L (ref 19–32)
CREATININE: 0.99 mg/dL (ref 0.40–1.50)
Calcium: 9.4 mg/dL (ref 8.4–10.5)
GFR: 77.74 mL/min (ref >60.00)
GLUCOSE: 126 mg/dL — AB (ref 70–99)
POTASSIUM: 4.3 meq/L (ref 3.5–5.1)
SODIUM: 139 meq/L (ref 135–145)
Total Bilirubin: 1 mg/dL (ref 0.2–1.2)
Total Protein: 7.1 g/dL (ref 6.0–8.3)

## 2015-02-10 LAB — HEMOGLOBIN A1C: HEMOGLOBIN A1C: 5.5 % (ref 4.6–6.5)

## 2015-02-10 LAB — MICROALBUMIN / CREATININE URINE RATIO
CREATININE, U: 135.2 mg/dL
MICROALB UR: 13 mg/dL — AB (ref 0.0–1.9)
Microalb Creat Ratio: 9.6 mg/g (ref 0.0–30.0)

## 2015-02-15 ENCOUNTER — Encounter: Payer: Self-pay | Admitting: Endocrinology

## 2015-02-15 ENCOUNTER — Ambulatory Visit (INDEPENDENT_AMBULATORY_CARE_PROVIDER_SITE_OTHER): Payer: Medicare Other | Admitting: Endocrinology

## 2015-02-15 ENCOUNTER — Other Ambulatory Visit: Payer: Self-pay | Admitting: *Deleted

## 2015-02-15 ENCOUNTER — Ambulatory Visit (INDEPENDENT_AMBULATORY_CARE_PROVIDER_SITE_OTHER): Payer: Medicare Other | Admitting: *Deleted

## 2015-02-15 VITALS — BP 134/72 | HR 78 | Temp 98.5°F | Resp 16 | Ht 74.0 in | Wt 270.4 lb

## 2015-02-15 DIAGNOSIS — Z5181 Encounter for therapeutic drug level monitoring: Secondary | ICD-10-CM | POA: Diagnosis not present

## 2015-02-15 DIAGNOSIS — E119 Type 2 diabetes mellitus without complications: Secondary | ICD-10-CM

## 2015-02-15 DIAGNOSIS — E669 Obesity, unspecified: Secondary | ICD-10-CM

## 2015-02-15 DIAGNOSIS — Z23 Encounter for immunization: Secondary | ICD-10-CM

## 2015-02-15 DIAGNOSIS — I70219 Atherosclerosis of native arteries of extremities with intermittent claudication, unspecified extremity: Secondary | ICD-10-CM

## 2015-02-15 DIAGNOSIS — R600 Localized edema: Secondary | ICD-10-CM

## 2015-02-15 DIAGNOSIS — I482 Chronic atrial fibrillation, unspecified: Secondary | ICD-10-CM

## 2015-02-15 DIAGNOSIS — I359 Nonrheumatic aortic valve disorder, unspecified: Secondary | ICD-10-CM

## 2015-02-15 LAB — POCT INR: INR: 3.2

## 2015-02-15 NOTE — Progress Notes (Signed)
Patient ID: [name], male   DOB: [birth year], 79 y.o.   MRN: [id number]   Reason for Appointment: Diabetes follow-up   History of Present Illness   Diagnosis: Type 2 DIABETES MELITUS date of onset: 07/2010       Oral hypoglycemic drugs: metformin 2250 mg daily       Side effects from medications: None  He was started on metformin when his baseline A1c was 6.9% and he had significant obesity He was also sent for diabetes education. Since then A1c had been consistently upper normal  Recent history:  Oral hypoglycemic drugs: Metformin ER 750 mg, 3 daily  His A1c again is excellent at 5.5%  Tolerating high dose metformin without any GI side effects, has been on the same regimen for some time He has difficulty losing weight and not able to exercise consistently He thinks he is fairly consistent with diet   Monitors blood glucose:  infrequently  HOME glucose readings mostly checked after dinner, range 100-118, median 110 Has only one reading at midday of 170 and no fasting readings      Fasting glucose in the lab is 127  Meals: breakfast:occasional cereal otherwise muffin and peanut butter; occasionally sweets for snacks  Physical activity: exercise: walking some, limited by leg pain            Dietician visit: Most recent:2012          Last eye exam: annual    Wt Readings from Last 3 Encounters:  02/15/15 270 lb 6.4 oz (122.653 kg)  02/01/15 266 lb 12.8 oz (121.02 kg)  01/04/15 266 lb 1.9 oz (120.711 kg)   Lab Results  Component Value Date   HGBA1C 5.5 02/10/2015   HGBA1C 5.4 08/11/2014   HGBA1C 5.4 02/09/2014   Lab Results  Component Value Date   MICROALBUR 13.0* 02/10/2015   LDLCALC 58 08/11/2013   CREATININE 0.99 02/10/2015    LABS:  Anti-coag visit on 02/15/2015  Component Date Value Ref Range Status  . INR 02/15/2015 3.2   Final  Appointment on 02/10/2015  Component Date Value Ref Range Status  . Microalb, Ur 02/10/2015 13.0* 0.0 - 1.9  mg/dL Final  . Creatinine,U 02/10/2015 135.2   Final  . Microalb Creat Ratio 02/10/2015 9.6  0.0 - 30.0 mg/g Final  . Hgb A1c MFr Bld 02/10/2015 5.5  4.6 - 6.5 % Final   Glycemic Control Guidelines for People with Diabetes:Non Diabetic:  <6%Goal of Therapy: <7%Additional Action Suggested:  >8%   . Sodium 02/10/2015 139  135 - 145 mEq/L Final  . Potassium 02/10/2015 4.3  3.5 - 5.1 mEq/L Final  . Chloride 02/10/2015 105  96 - 112 mEq/L Final  . CO2 02/10/2015 24  19 - 32 mEq/L Final  . Glucose, Bld 02/10/2015 126* 70 - 99 mg/dL Final  . BUN 02/10/2015 23  6 - 23 mg/dL Final  . Creatinine, Ser 02/10/2015 0.99  0.40 - 1.50 mg/dL Final  . Total Bilirubin 02/10/2015 1.0  0.2 - 1.2 mg/dL Final  . Alkaline Phosphatase 02/10/2015 88  39 - 117 U/L Final  . AST 02/10/2015 17  0 - 37 U/L Final  . ALT 02/10/2015 11  0 - 53 U/L Final  . Total Protein 02/10/2015 7.1  6.0 - 8.3 g/dL Final  . Albumin 02/10/2015 4.2  3.5 - 5.2 g/dL Final  . Calcium 02/10/2015 9.4  8.4 - 10.5 mg/dL Final  . GFR 02/10/2015 77.74  >60.00 mL/min Final  Medication List       This list is accurate as of: 02/15/15 10:08 AM.  Always use your most recent med list.               carvedilol 25 MG tablet  Commonly known as:  COREG  Take 1 tablet (25 mg total) by mouth 2 (two) times daily with a meal.     enalapril 20 MG tablet  Commonly known as:  VASOTEC  TAKE 1 TABLET BY MOUTH EVERY DAY     ezetimibe-simvastatin 10-20 MG tablet  Commonly known as:  VYTORIN  Take 1 tablet by mouth at bedtime.     fluocinonide cream 0.05 %  Commonly known as:  LIDEX  Apply 1 application topically 2 (two) times daily.     folic acid 1 MG tablet  Commonly known as:  FOLVITE  Take 1 tablet (1 mg total) by mouth daily.     furosemide 40 MG tablet  Commonly known as:  LASIX  Take 40 mg by mouth daily as needed (swelling).     glucose blood test strip  Commonly known as:  ONE TOUCH ULTRA TEST  USE AS DIRECTED TO TEST BLOOD  GLUCOSE  EVERY OTHER DAY DX: 250.00     metFORMIN 750 MG 24 hr tablet  Commonly known as:  GLUCOPHAGE-XR  TAKE 3 TABLETS BY MOUTH EVERY DAY     NON FORMULARY  BIPAP     NON FORMULARY  ONE TOUCH ULTRA TEST STRIPS  AND LANCETS     ONETOUCH DELICA LANCETS FINE Misc  Use to obtain a blood specimen every other day Dx code 250.00     potassium chloride 10 MEQ tablet  Commonly known as:  KLOR-CON M10  Take 1 tablet (10 mEq total) by mouth daily.     PROAIR HFA 108 (90 BASE) MCG/ACT inhaler  Generic drug:  albuterol  Inhale 2 puffs into the lungs every 6 (six) hours as needed. For chest congestion     Vitamin B12 3000 MCG/ML Liqd  Place 2,000 mcg under the tongue daily.     warfarin 5 MG tablet  Commonly known as:  COUMADIN  TAKE AS DIRECTED BY COUMADIN CLINIC        Allergies:  Allergies  Allergen Reactions  . Rifampin     REACTION: rash (unclear if due to vanco or rifampin)  . Vancomycin     REACTION: rash (unclear if due to rifampin or vanco)    Past Medical History  Diagnosis Date  . Hx of CABG   . Aortic stenosis   . Chronic atrial fibrillation ([place])   . CHF with unknown LVEF (HCC)     47 %  . Subclavian bypass stenosis ([place])   . Rheumatic fever   . Vitamin B12 deficiency   . OSA (obstructive sleep apnea)   . Hypertension   . Carotid artery disease ([place]) 1994    s/p left carotid endarerectomy   . Left ventricular dysfunction 2007    LVEF 47%     Past Surgical History  Procedure Laterality Date  . Heart bypass      Done in [place]  . Cardiac valve surgery  1994    replaced due to aortic stenosis, [employer]. Jude mechanical prostesis  . Coronary artery bypass graft  1994    w SVG to RCA and SVG to circumflex  . Carotid endarterectomy Left 1997    subclavian bypass Done in [place]    Family History  Problem  Relation Age of Onset  . Cancer Father     lung   . Hypertension Mother     Social History:  reports that he quit smoking about 22 years ago.  His smoking use included Cigarettes. He has a 30 pack-year smoking history. He has never used smokeless tobacco. He reports that he drinks alcohol. He reports that he does not use illicit drugs.  Review of Systems    HYPERTENSION:  he is on multiple drugs with good control and normal renal function  HYPERLIPIDEMIA:         The lipid abnormality consists of elevated LDL treated with Vytorin and followed by PCP and last LDL was 60 in June 2016 .    Lab Results  Component Value Date   CHOL 118 08/11/2013   HDL 48.60 08/11/2013   LDLCALC 58 08/11/2013   TRIG 58.0 08/11/2013   CHOLHDL 2 08/11/2013   He has dependent pedal edema, is on Lasix   Examination:   BP 134/72 mmHg  Pulse 78  Temp(Src) 98.5 F (36.9 C)  Resp 16  Ht 6\' 2"  (1.88 m)  Wt 270 lb 6.4 oz (122.653 kg)  BMI 34.70 kg/m2  SpO2 90%  Body mass index is 34.7 kg/(m^2).   Diabetic foot exam shows normal monofilament sensation in the toes and plantar surfaces, no skin lesions or ulcers on the feet, does have onychomycosis especially right first and second toenails Decreased left-sided pedal pulses and absent on the right  2+ right ankle and 1+ left ankle edema  Assesment/PLAN:   1. Diabetes type 2   The patient's diabetes is overall well controlled with normal A1c again of 5.4% He has not consistent controlled since his diagnosis in 2012 with metformin alone He has done some walking but can do better, leg pain is limiting his ability to exercise much He is fairly good but his diet although cannulating sweets Still has difficulty losing weight He does check his blood sugar  sporadically and most of the readings are normal after evening meal but fasting reading was 127 in the lab Discussed timing and targets of blood sugars at various times  He will continue on maximum dose metformin and call if blood sugars are unusually high Encouraged him to exercise as much as possible  2.  Hypertension: Well controlled     Influenza vaccine given  Juri Dinning 02/15/2015, 10:08 AM

## 2015-02-15 NOTE — Patient Instructions (Addendum)
Check blood sugars on waking up 2  times a week  Also check blood sugars about 2 hours after a meal and do this after different meals by rotation  Recommended blood sugar levels on waking up is 90-130 and about 2 hours after meal is 130-160  Please bring your blood sugar monitor to each visit, thank you

## 2015-02-19 ENCOUNTER — Ambulatory Visit: Payer: Medicare Other | Admitting: Vascular Surgery

## 2015-02-19 ENCOUNTER — Other Ambulatory Visit: Payer: Self-pay | Admitting: Interventional Cardiology

## 2015-02-19 ENCOUNTER — Ambulatory Visit (HOSPITAL_COMMUNITY)
Admission: RE | Admit: 2015-02-19 | Discharge: 2015-02-19 | Disposition: A | Discharge status: Home / Self Care | Payer: Medicare Other | Source: Ambulatory Visit | Attending: Vascular Surgery | Admitting: Vascular Surgery

## 2015-02-19 ENCOUNTER — Other Ambulatory Visit: Payer: Self-pay | Admitting: Vascular Surgery

## 2015-02-19 DIAGNOSIS — I739 Peripheral vascular disease, unspecified: Secondary | ICD-10-CM | POA: Insufficient documentation

## 2015-02-19 DIAGNOSIS — I10 Essential (primary) hypertension: Secondary | ICD-10-CM | POA: Insufficient documentation

## 2015-02-19 DIAGNOSIS — I70219 Atherosclerosis of native arteries of extremities with intermittent claudication, unspecified extremity: Secondary | ICD-10-CM

## 2015-02-22 NOTE — Telephone Encounter (Signed)
Please advise 

## 2015-02-26 ENCOUNTER — Telehealth: Payer: Self-pay | Admitting: Hematology

## 2015-02-26 NOTE — Telephone Encounter (Signed)
PAL - moved 12/29 f/u to 12/20. Spoke with patient he is aware.

## 2015-03-01 ENCOUNTER — Encounter: Payer: Self-pay | Admitting: Vascular Surgery

## 2015-03-03 ENCOUNTER — Encounter: Payer: Self-pay | Admitting: Vascular Surgery

## 2015-03-03 ENCOUNTER — Ambulatory Visit (INDEPENDENT_AMBULATORY_CARE_PROVIDER_SITE_OTHER): Payer: Medicare Other | Admitting: Vascular Surgery

## 2015-03-03 VITALS — BP 114/63 | HR 61 | Temp 98.1°F | Resp 16 | Ht 74.0 in | Wt 271.0 lb

## 2015-03-03 DIAGNOSIS — I70211 Atherosclerosis of native arteries of extremities with intermittent claudication, right leg: Secondary | ICD-10-CM

## 2015-03-03 DIAGNOSIS — I739 Peripheral vascular disease, unspecified: Secondary | ICD-10-CM

## 2015-03-03 NOTE — Progress Notes (Signed)
Established Intermittent Claudication   History of Present Illness  [name] is a 79 y.o. (1935/10/01) male who presents with chief complaint: right thigh cramping. The patient's symptoms are completely stable.  The patient's symptoms are: right thigh cramping and fatigue extending to calf with > 1 block walking. The patient's treatment regimen currently included: maximal medical management and walking plan.  Prior exercise ABI verifying low grade iliac disease on R side.  The patient's PMH, PSH, and SH, and FamHx are unchanged from 11/20/14.  Current Outpatient Prescriptions  Medication Sig Dispense Refill  . carvedilol (COREG) 25 MG tablet Take 1 tablet (25 mg total) by mouth 2 (two) times daily with a meal. 180 tablet 3  . Cyanocobalamin (VITAMIN B12) 3000 MCG/ML LIQD Place 2,000 mcg under the tongue daily.    . enalapril (VASOTEC) 20 MG tablet TAKE 1 TABLET BY MOUTH EVERY DAY 90 tablet 1  . ezetimibe-simvastatin (VYTORIN) 10-20 MG per tablet Take 1 tablet by mouth at bedtime. 90 tablet 2  . fluocinonide cream (LIDEX) [id number] % Apply 1 application topically 2 (two) times daily.     . folic acid (FOLVITE) 1 MG tablet Take 1 tablet (1 mg total) by mouth daily.    . furosemide (LASIX) 40 MG tablet Take 40 mg by mouth daily as needed (swelling).     Marland Kitchen glucose blood (ONE TOUCH ULTRA TEST) test strip USE AS DIRECTED TO TEST BLOOD GLUCOSE  EVERY OTHER DAY DX: 250.00 50 each 2  . metFORMIN (GLUCOPHAGE-XR) 750 MG 24 hr tablet TAKE 3 TABLETS BY MOUTH EVERY DAY 270 tablet 1  . NON FORMULARY BIPAP    . NON FORMULARY ONE TOUCH ULTRA TEST STRIPS  AND LANCETS    . ONETOUCH DELICA LANCETS FINE MISC Use to obtain a blood specimen every other day Dx code 250.00 100 each 1  . potassium chloride (KLOR-CON M10) 10 MEQ tablet Take 1 tablet (10 mEq total) by mouth daily. 30 tablet 11  . PROAIR HFA 108 (90 BASE) MCG/ACT inhaler Inhale 2 puffs into the lungs every 6 (six) hours as needed. For chest  congestion    . warfarin (COUMADIN) 5 MG tablet TAKE AS DIRECTED BY COUMADIN CLINIC 45 tablet 3   No current facility-administered medications for this visit.   On ROS today: no rest pain, no ulcers.   Physical Examination Filed Vitals:   03/03/15 0859  BP: 114/63  Pulse: 61  Temp: 98.1 F (36.7 C)  Resp: 16  Height: 6\' 2"  (1.88 m)  Weight: 271 lb (122.925 kg)  SpO2: 93%   Body mass index is 34.78 kg/(m^2).  General: A&O x 3, WDWN  Pulmonary: Sym exp, good air movt, CTAB, no rales, rhonchi, & wheezing  Cardiac: RRR, Nl S1, S2, no Murmurs, rubs or gallops  Vascular: Vessel Right Left  Radial Palpable Palpable  Brachial Palpable Palpable  Carotid Palpable, without bruit Palpable, without bruit  Aorta Not palpable N/A  Femoral Palpable Palpable  Popliteal Not palpable Not palpable  PT Palpable Palpable  DP Palpable Palpable   Gastrointestinal: soft, NTND, no G/R, no HSM, no masses, no CVAT B  Musculoskeletal: M/S 5/5 throughout , Extremities without ischemic changes   Neurologic: Pain and light touch intact in extremities , Motor exam as listed above   Non-Invasive Vascular Imaging ABI (Date: 11/18*16)  R:   ABI: 1.08 (1.18),   DP: tri,   PT: tri,   TBI: 0.71  L:   ABI: 0.98 (1.02),  DP: [name],   PTMirian Beard,   TBI: 0.70   Medical Decision Making  [name] is a 79 y.o. (Sep 27, 1935) male who presents with:right leg intermittent claudication without evidence of critical limb ischemia, R iliac artery stenosis.   Exercise ABI demonstrates some R iliac disease likely mild to moderate.  At this point the patient feels his sx are NOT lifestyle limiting, so I would hold off on endovascular intervention.  If his sx increase, I would consider: Aortogram, bilateral runoff, and possible right iliac artery intervention.  I would repeat the ABI in 12 months to monitor any progression in his right iliac disease.  I discussed  in depth with the patient the nature of atherosclerosis, and emphasized the importance of maximal medical management including strict control of blood pressure, blood glucose, and lipid levels, antiplatelet agents, obtaining regular exercise, and cessation of smoking.   The patient is aware that without maximal medical management the underlying atherosclerotic disease process will progress, limiting the benefit of any interventions.  The patient is currently on a statin: Vytorin.  The patient is currently on an anti-platelet: ASA.  Thank you for allowing [place] to participate in this patient's care.   Adele Barthel, MD [facility] Office: [phone] Pager: [phone]  03/03/2015, 9:31 AM

## 2015-03-03 NOTE — Addendum Note (Signed)
Addended by: Dorthula Rue L on: 03/03/2015 11:14 AM   Modules accepted: Orders

## 2015-03-23 ENCOUNTER — Other Ambulatory Visit (HOSPITAL_BASED_OUTPATIENT_CLINIC_OR_DEPARTMENT_OTHER): Payer: Medicare Other

## 2015-03-23 ENCOUNTER — Encounter: Payer: Self-pay | Admitting: Hematology

## 2015-03-23 ENCOUNTER — Telehealth: Payer: Self-pay | Admitting: Hematology

## 2015-03-23 ENCOUNTER — Ambulatory Visit (HOSPITAL_BASED_OUTPATIENT_CLINIC_OR_DEPARTMENT_OTHER): Payer: Medicare Other | Admitting: Hematology

## 2015-03-23 VITALS — BP 119/83 | HR 68 | Temp 97.5°F | Resp 20 | Ht 74.0 in | Wt 247.6 lb

## 2015-03-23 DIAGNOSIS — I2510 Atherosclerotic heart disease of native coronary artery without angina pectoris: Secondary | ICD-10-CM | POA: Diagnosis not present

## 2015-03-23 DIAGNOSIS — Z23 Encounter for immunization: Secondary | ICD-10-CM | POA: Diagnosis not present

## 2015-03-23 DIAGNOSIS — D7589 Other specified diseases of blood and blood-forming organs: Secondary | ICD-10-CM | POA: Diagnosis not present

## 2015-03-23 DIAGNOSIS — Z951 Presence of aortocoronary bypass graft: Secondary | ICD-10-CM | POA: Diagnosis not present

## 2015-03-23 DIAGNOSIS — D696 Thrombocytopenia, unspecified: Secondary | ICD-10-CM

## 2015-03-23 DIAGNOSIS — I10 Essential (primary) hypertension: Secondary | ICD-10-CM | POA: Diagnosis not present

## 2015-03-23 DIAGNOSIS — Z7901 Long term (current) use of anticoagulants: Secondary | ICD-10-CM | POA: Diagnosis not present

## 2015-03-23 DIAGNOSIS — Z952 Presence of prosthetic heart valve: Secondary | ICD-10-CM | POA: Diagnosis not present

## 2015-03-23 LAB — CBC & DIFF AND RETIC
BASO%: 0.9 % (ref 0.0–2.0)
Basophils Absolute: 0 10*3/uL (ref 0.0–0.1)
EOS%: 1.7 % (ref 0.0–7.0)
Eosinophils Absolute: 0.1 10*3/uL (ref 0.0–0.5)
HCT: 41.9 % (ref 38.4–49.9)
HGB: 13.4 g/dL (ref 13.0–17.1)
IMMATURE RETIC FRACT: 13.7 % — AB (ref 3.00–10.60)
LYMPH#: 0.7 10*3/uL — AB (ref 0.9–3.3)
LYMPH%: 21.1 % (ref 14.0–49.0)
MCH: 35 pg — ABNORMAL HIGH (ref 27.2–33.4)
MCHC: 32 g/dL (ref 32.0–36.0)
MCV: 109.4 fL — ABNORMAL HIGH (ref 79.3–98.0)
MONO#: 0.3 10*3/uL (ref 0.1–0.9)
MONO%: 9 % (ref 0.0–14.0)
NEUT%: 67.3 % (ref 39.0–75.0)
NEUTROS ABS: 2.3 10*3/uL (ref 1.5–6.5)
Platelets: 71 10*3/uL — ABNORMAL LOW (ref 140–400)
RBC: 3.83 10*6/uL — AB (ref 4.20–5.82)
RDW: 15.3 % — ABNORMAL HIGH (ref 11.0–14.6)
RETIC %: 2.04 % — AB (ref 0.80–1.80)
RETIC CT ABS: 78.13 10*3/uL (ref 34.80–93.90)
WBC: 3.5 10*3/uL — AB (ref 4.0–10.3)

## 2015-03-23 LAB — COMPREHENSIVE METABOLIC PANEL
ALBUMIN: 3.8 g/dL (ref 3.5–5.0)
ALT: 13 U/L (ref 0–55)
AST: 17 U/L (ref 5–34)
Alkaline Phosphatase: 93 U/L (ref 40–150)
Anion Gap: 8 mEq/L (ref 3–11)
BUN: 23 mg/dL (ref 7.0–26.0)
CHLORIDE: 106 meq/L (ref 98–109)
CO2: 24 mEq/L (ref 22–29)
Calcium: 9.3 mg/dL (ref 8.4–10.4)
Creatinine: 1 mg/dL (ref 0.7–1.3)
EGFR: 68 mL/min/{1.73_m2} — ABNORMAL LOW (ref >90)
GLUCOSE: 113 mg/dL (ref 70–140)
POTASSIUM: 4.8 meq/L (ref 3.5–5.1)
SODIUM: 138 meq/L (ref 136–145)
Total Bilirubin: 1.09 mg/dL (ref 0.20–1.20)
Total Protein: 7.1 g/dL (ref 6.4–8.3)

## 2015-03-23 NOTE — Telephone Encounter (Signed)
per pof to sch pt appt-gave pt copy of avs °

## 2015-03-31 ENCOUNTER — Ambulatory Visit (INDEPENDENT_AMBULATORY_CARE_PROVIDER_SITE_OTHER): Payer: Medicare Other | Admitting: Pharmacist

## 2015-03-31 DIAGNOSIS — I359 Nonrheumatic aortic valve disorder, unspecified: Secondary | ICD-10-CM | POA: Diagnosis not present

## 2015-03-31 DIAGNOSIS — I482 Chronic atrial fibrillation, unspecified: Secondary | ICD-10-CM

## 2015-03-31 DIAGNOSIS — Z5181 Encounter for therapeutic drug level monitoring: Secondary | ICD-10-CM | POA: Diagnosis not present

## 2015-03-31 LAB — POCT INR: INR: 2.3

## 2015-04-01 ENCOUNTER — Ambulatory Visit: Payer: Medicare Other | Admitting: Hematology

## 2015-04-01 ENCOUNTER — Other Ambulatory Visit: Payer: Medicare Other

## 2015-04-06 NOTE — Progress Notes (Signed)
[place]   Hematology Oncology Clinic Note  Date of service: 03/23/2015   Patient Care Team: Antony Blackbird, MD as PCP - General (Family Medicine) Brunetta Genera, MD as Consulting Physician (Hematology) Belva Crome, MD as Consulting Physician (Cardiology)  CHIEF COMPLAINTS: follow-up for Macrocytosis and Thrombocytopenia.  HISTORY OF PRESENTING ILLNESS: Please see my initial note for details on initial presentation.  Interval history  [name] is here for scheduled follow-up regarding his thrombocytopenia.  He has had no issues with bleeding. His Coumadin levels are within stable. No other acute new concerns. Labs today show that his platelet counts have improved to 71,000. No other new medications. No fevers/chills/night sweats/weight loss. Feeling well overall.   MEDICAL HISTORY:  Past Medical History  Diagnosis Date  . Hx of CABG   . Aortic stenosis   . Chronic atrial fibrillation ([place])   . CHF with unknown LVEF (HCC)     47 %  . Subclavian bypass stenosis ([place])   . Rheumatic fever   . Vitamin B12 deficiency   . OSA (obstructive sleep apnea)   . Hypertension   . Carotid artery disease ([place]) 1994    s/p left carotid endarerectomy   . Left ventricular dysfunction 2007    LVEF 47%     SURGICAL HISTORY: Past Surgical History  Procedure Laterality Date  . Heart bypass      Done in [place]  . Cardiac valve surgery  1994    replaced due to aortic stenosis, St. Jude mechanical prostesis  . Coronary artery bypass graft  1994    w SVG to RCA and SVG to circumflex  . Carotid endarterectomy Left 1997    subclavian bypass Done in [place]    SOCIAL HISTORY: Social History   Social History  . Marital Status: Married    Spouse Name: N/A  . Number of Children: N/A  . Years of Education: N/A   Occupational History  . Not on file.   Social History Main Topics  . Smoking status: Former Smoker -- 1.00 packs/day for 30 years    Types:  Cigarettes    Quit date: 04/03/1992  . Smokeless tobacco: Never Used  . Alcohol Use: Yes     Comment: 4-6 ounces of wine daily  . Drug Use: No  . Sexual Activity: Not on file   Other Topics Concern  . Not on file   Social History Narrative    FAMILY HISTORY: Family History  Problem Relation Age of Onset  . Cancer Father     lung   . Hypertension Mother     ALLERGIES:  is allergic to rifampin and vancomycin.  MEDICATIONS:  Current Outpatient Prescriptions  Medication Sig Dispense Refill  . carvedilol (COREG) 25 MG tablet Take 1 tablet (25 mg total) by mouth 2 (two) times daily with a meal. 180 tablet 3  . Cyanocobalamin (VITAMIN B12) 3000 MCG/ML LIQD Place 2,000 mcg under the tongue daily.    . enalapril (VASOTEC) 20 MG tablet TAKE 1 TABLET BY MOUTH EVERY DAY 90 tablet 1  . ezetimibe-simvastatin (VYTORIN) 10-20 MG per tablet Take 1 tablet by mouth at bedtime. 90 tablet 2  . fluocinonide cream (LIDEX) 8.87 % Apply 1 application topically 2 (two) times daily.     . folic acid (FOLVITE) 1 MG tablet Take 1 tablet (1 mg total) by mouth daily.    . furosemide (LASIX) 40 MG tablet Take 40 mg by mouth daily as needed (swelling).     Marland Kitchen  glucose blood (ONE TOUCH ULTRA TEST) test strip USE AS DIRECTED TO TEST BLOOD GLUCOSE  EVERY OTHER DAY DX: 250.00 50 each 2  . metFORMIN (GLUCOPHAGE-XR) 750 MG 24 hr tablet TAKE 3 TABLETS BY MOUTH EVERY DAY 270 tablet 1  . NON FORMULARY BIPAP    . NON FORMULARY ONE TOUCH ULTRA TEST STRIPS  AND LANCETS    . ONETOUCH DELICA LANCETS FINE MISC Use to obtain a blood specimen every other day Dx code 250.00 100 each 1  . potassium chloride (KLOR-CON M10) 10 MEQ tablet Take 1 tablet (10 mEq total) by mouth daily. 30 tablet 11  . PROAIR HFA 108 (90 BASE) MCG/ACT inhaler Inhale 2 puffs into the lungs every 6 (six) hours as needed. For chest congestion    . warfarin (COUMADIN) 5 MG tablet TAKE AS DIRECTED BY COUMADIN CLINIC 45 tablet 3   No current  facility-administered medications for this visit.    REVIEW OF SYSTEMS:    10 point review of system is negative other than that mentioned above.  PHYSICAL EXAMINATION: ECOG PERFORMANCE STATUS: 1 - Symptomatic but completely ambulatory  Filed Vitals:   03/23/15 0846  BP: 119/83  Pulse: 68  Temp: 97.5 F (36.4 C)  Resp: 20   Filed Weights   03/23/15 0846  Weight: 247 lb 9.6 oz (112.311 kg)    GENERAL:alert, no distress and comfortable SKIN: skin color, texture, turgor are normal, no rashes no petechiae. EYES: normal, conjunctiva are pink and non-injected, sclera clear OROPHARYNX:no exudate, no erythema and lips, buccal mucosa, and tongue normal  NECK: supple, thyroid normal size, non-tender, without nodularity LYMPH:  no palpable lymphadenopathy in the cervical, axillary or inguinal LUNGS: clear to auscultation and percussion with normal breathing effort HEART: regular rate & rhythm and no murmurs and no lower extremity edema.clear click of his metallic aortic valve noted.  ABDOMEN:abdomen soft, non-tender and normal bowel sounds Musculoskeletal:no cyanosis of digits and no clubbing  PSYCH: alert & oriented x 3 with fluent speech NEURO: no focal motor/sensory deficits  LABORATORY DATA:   . CBC Latest Ref Rng 03/23/2015 02/01/2015 12/01/2014  WBC 4.0 - 10.3 10e3/uL 3.5(L) 3.9(L) 3.2(L)  Hemoglobin 13.0 - 17.1 g/dL 13.4 13.5 13.5  Hematocrit 38.4 - 49.9 % 41.9 42.0 41.5  Platelets 140 - 400 10e3/uL 71(L) 67(L) 58(L)    . CMP Latest Ref Rng 03/23/2015 02/10/2015 02/01/2015  Glucose 70 - 140 mg/dl 113 126(H) 106  BUN 7.0 - 26.0 mg/dL 23.0 23 22.3  Creatinine 0.7 - 1.3 mg/dL 1.0 0.99 1.0  Sodium 136 - 145 mEq/L 138 139 140  Potassium 3.5 - 5.1 mEq/L 4.8 4.3 5.2(H)  Chloride 96 - 112 mEq/L - 105 -  CO2 22 - 29 mEq/L 24 24 26   Calcium 8.4 - 10.4 mg/dL 9.3 9.4 9.8  Total Protein 6.4 - 8.3 g/dL 7.1 7.1 7.2  Total Bilirubin 0.20 - 1.20 mg/dL 1.09 1.0 1.19  Alkaline Phos 40  - 150 U/L 93 88 94  AST 5 - 34 U/L 17 17 18   ALT 0 - 55 U/L 13 11 13     RADIOGRAPHIC STUDIES: I have personally reviewed the radiological images as listed and agreed with the findings in the report. No results found.  ASSESSMENT & PLAN   1) Thrombocytopenia. Moderate. Platelet counts are stable and the same ballpark at [facility].  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 + cannot rule out ITP element. Could have possible element  of MDS. He has some pelgeroid neutrophils on his peripheral blood smear that are suggestive of possible MDS. Hepatitis B, and C serologies negative. No evidence of a lymphoproliferative syndrome on peripheral blood smear or clinical examination. SPEP showed no monoclonal protein.  Plan - patient's platelets remain stable and somewhat better on this visit. -Avoid medications causing thrombocytopenia. -If platelet counts drop below 50,000 might need to limit the INR fluctuations on Coumadin from 2-3 more  strictly. -Absolute alcohol cessation -Avoid NSAIDS and other medications known to cause thrombocytopenia especially ranitidine and other  H2 blockers, sulfa drugs, PPIs. -continue vitamin B12 2000 g sublingually daily and Folic acid 1 mg by mouth daily. -if worsening cytopenias might consider a bone marrow biopsy.  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 Has somewhat elevated LDH and low haptoglobin confirms the presence of low-grade hemolysis. Patient has no clinical evidence of hemoglobinuria. Peripheral blood smear showed minimal rare schistocytes. No micro-spherocytes to suggest autoimmune hemolysis.Coombs negative. 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 PLAN -aggressive B12 replacement - folic acid 1-2 mg po daily to maintain erythropoiesis in the setting of increased demand  from  chronic hemolysis. -Bone marrow examination if worsening anemia or other cytopenia.  Return to clinic with Dr. Irene Limbo in 3-4 months with repeat CBC, CMP. Patient counseled to call earlier if any new acute concerns or questions arise.  I appreciate the privilege of taking part in the care of this wonderful gentleman.  Total time spent 15 minutes more than 50% of the time in direct patient care counseling and coordination of care.  Sullivan Lone MD [place] Hematology/Oncology Physician [facility]  (Office):       [phone] (Work cell):  [phone] (Fax):           [phone]

## 2015-04-07 ENCOUNTER — Other Ambulatory Visit: Payer: Medicare Other

## 2015-04-28 ENCOUNTER — Ambulatory Visit (INDEPENDENT_AMBULATORY_CARE_PROVIDER_SITE_OTHER): Payer: Medicare Other | Admitting: *Deleted

## 2015-04-28 DIAGNOSIS — I359 Nonrheumatic aortic valve disorder, unspecified: Secondary | ICD-10-CM | POA: Diagnosis not present

## 2015-04-28 DIAGNOSIS — I482 Chronic atrial fibrillation, unspecified: Secondary | ICD-10-CM

## 2015-04-28 DIAGNOSIS — Z5181 Encounter for therapeutic drug level monitoring: Secondary | ICD-10-CM

## 2015-04-28 LAB — POCT INR: INR: 3.1

## 2015-06-09 ENCOUNTER — Ambulatory Visit (INDEPENDENT_AMBULATORY_CARE_PROVIDER_SITE_OTHER): Payer: Medicare Other | Admitting: *Deleted

## 2015-06-09 DIAGNOSIS — I482 Chronic atrial fibrillation, unspecified: Secondary | ICD-10-CM

## 2015-06-09 DIAGNOSIS — I359 Nonrheumatic aortic valve disorder, unspecified: Secondary | ICD-10-CM

## 2015-06-09 DIAGNOSIS — Z5181 Encounter for therapeutic drug level monitoring: Secondary | ICD-10-CM

## 2015-06-09 LAB — POCT INR: INR: 3

## 2015-06-22 ENCOUNTER — Other Ambulatory Visit: Payer: Self-pay | Admitting: Interventional Cardiology

## 2015-06-22 ENCOUNTER — Other Ambulatory Visit: Payer: Self-pay | Admitting: *Deleted

## 2015-06-22 NOTE — Telephone Encounter (Signed)
Patient/pharmacy requests ninety day supply.

## 2015-06-28 ENCOUNTER — Other Ambulatory Visit: Payer: Self-pay | Admitting: Interventional Cardiology

## 2015-06-28 MED ORDER — POTASSIUM CHLORIDE CRYS ER 10 MEQ PO TBCR: 10.0000 meq | EXTENDED_RELEASE_TABLET | Freq: Every day | ORAL | Status: DC

## 2015-06-28 MED ORDER — WARFARIN SODIUM 5 MG PO TABS: ORAL_TABLET | ORAL | Status: DC

## 2015-06-28 NOTE — Telephone Encounter (Signed)
°  New Prob    *STAT* If patient is at the pharmacy, call can be transferred to refill team.   1. Which medications need to be refilled? (please list name of each medication and dose if known) Potassium Chloride 10 mg and Coumadin 5 mg  2. Which pharmacy/location (including street and city if local pharmacy) is medication to be sent to? [facility] on [address]  3. Do they need a 30 day or 90 day supply? 90 day supplies

## 2015-07-21 ENCOUNTER — Ambulatory Visit (INDEPENDENT_AMBULATORY_CARE_PROVIDER_SITE_OTHER): Payer: Medicare Other | Admitting: *Deleted

## 2015-07-21 DIAGNOSIS — I482 Chronic atrial fibrillation, unspecified: Secondary | ICD-10-CM

## 2015-07-21 DIAGNOSIS — I359 Nonrheumatic aortic valve disorder, unspecified: Secondary | ICD-10-CM

## 2015-07-21 DIAGNOSIS — Z5181 Encounter for therapeutic drug level monitoring: Secondary | ICD-10-CM

## 2015-07-21 LAB — POCT INR: INR: 2.6

## 2015-07-22 ENCOUNTER — Ambulatory Visit (HOSPITAL_BASED_OUTPATIENT_CLINIC_OR_DEPARTMENT_OTHER): Payer: Medicare Other | Admitting: Hematology

## 2015-07-22 ENCOUNTER — Telehealth: Payer: Self-pay | Admitting: Hematology

## 2015-07-22 ENCOUNTER — Other Ambulatory Visit (HOSPITAL_BASED_OUTPATIENT_CLINIC_OR_DEPARTMENT_OTHER): Payer: Medicare Other

## 2015-07-22 ENCOUNTER — Encounter: Payer: Self-pay | Admitting: Hematology

## 2015-07-22 VITALS — BP 117/59 | HR 79 | Temp 97.7°F | Resp 17 | Ht 74.0 in | Wt 276.8 lb

## 2015-07-22 DIAGNOSIS — D696 Thrombocytopenia, unspecified: Secondary | ICD-10-CM | POA: Diagnosis not present

## 2015-07-22 DIAGNOSIS — E875 Hyperkalemia: Secondary | ICD-10-CM

## 2015-07-22 DIAGNOSIS — D7589 Other specified diseases of blood and blood-forming organs: Secondary | ICD-10-CM

## 2015-07-22 LAB — COMPREHENSIVE METABOLIC PANEL
ALT: 13 U/L (ref 0–55)
ANION GAP: 10 meq/L (ref 3–11)
AST: 22 U/L (ref 5–34)
Albumin: 3.6 g/dL (ref 3.5–5.0)
Alkaline Phosphatase: 133 U/L (ref 40–150)
BUN: 19.3 mg/dL (ref 7.0–26.0)
CALCIUM: 9.4 mg/dL (ref 8.4–10.4)
CHLORIDE: 107 meq/L (ref 98–109)
CO2: 23 mEq/L (ref 22–29)
CREATININE: 1 mg/dL (ref 0.7–1.3)
EGFR: 73 mL/min/{1.73_m2} — AB (ref >90)
Glucose: 97 mg/dl (ref 70–140)
Sodium: 140 mEq/L (ref 136–145)
Total Bilirubin: 1.61 mg/dL — ABNORMAL HIGH (ref 0.20–1.20)
Total Protein: 7 g/dL (ref 6.4–8.3)

## 2015-07-22 LAB — CBC & DIFF AND RETIC
BASO%: 0.6 % (ref 0.0–2.0)
BASOS ABS: 0 10*3/uL (ref 0.0–0.1)
EOS%: 1.8 % (ref 0.0–7.0)
Eosinophils Absolute: 0.1 10*3/uL (ref 0.0–0.5)
HEMATOCRIT: 41 % (ref 38.4–49.9)
HGB: 13 g/dL (ref 13.0–17.1)
Immature Retic Fract: 15 % — ABNORMAL HIGH (ref 3.00–10.60)
LYMPH%: 19.7 % (ref 14.0–49.0)
MCH: 34.2 pg — AB (ref 27.2–33.4)
MCHC: 31.7 g/dL — AB (ref 32.0–36.0)
MCV: 107.9 fL — AB (ref 79.3–98.0)
MONO#: 0.4 10*3/uL (ref 0.1–0.9)
MONO%: 10.6 % (ref 0.0–14.0)
NEUT#: 2.2 10*3/uL (ref 1.5–6.5)
NEUT%: 67.3 % (ref 39.0–75.0)
PLATELETS: 67 10*3/uL — AB (ref 140–400)
RBC: 3.8 10*6/uL — ABNORMAL LOW (ref 4.20–5.82)
RDW: 16.5 % — ABNORMAL HIGH (ref 11.0–14.6)
Retic %: 2.12 % — ABNORMAL HIGH (ref 0.80–1.80)
Retic Ct Abs: 80.56 10*3/uL (ref 34.80–93.90)
WBC: 3.3 10*3/uL — ABNORMAL LOW (ref 4.0–10.3)
lymph#: 0.7 10*3/uL — ABNORMAL LOW (ref 0.9–3.3)

## 2015-07-22 NOTE — Telephone Encounter (Signed)
Gave and printed appt sched and avs for pt for April 2018 °

## 2015-07-22 NOTE — Patient Instructions (Signed)
Your potassium levels were elevated at 5.6. This is likely due to decreased use of the Lasix and potassium replacement as well as the fact that you're on Enalapril. -Please immediately discontinue your oral potassium replacement. -Avoid using food items with very high potassium levels such as bananas, orange juice, tomato juice etc. -Restart taking your Lasix daily as prescribed. -Recheck your potassium levels with her primary care physician in 1 week. If they are still elevated a primary care physician might consider ordering kayxelate to reduce her potassium levels and might also possibly consider adjusting/changing your Vasotec

## 2015-07-25 DIAGNOSIS — E875 Hyperkalemia: Secondary | ICD-10-CM | POA: Insufficient documentation

## 2015-07-25 NOTE — Progress Notes (Signed)
[place]   Hematology Oncology Clinic Note  Date of service:    Patient Care Team: Antony Blackbird, MD as PCP - General (Family Medicine) Brunetta Genera, MD as Consulting Physician (Hematology) Belva Crome, MD as Consulting Physician (Cardiology)  CHIEF COMPLAINTS: follow-up for Macrocytosis and Thrombocytopenia.  HISTORY OF PRESENTING ILLNESS: Please see my initial note for details on initial presentation.  Interval history  [name] is here for scheduled follow-up regarding his thrombocytopenia.  He notes that he had a good holiday season He has had no issues with bleeding. Coumadin levels stable.  No other acute new symptoms . Platelet counts remain stable at 67k. Reports no significant medication changes.  MEDICAL HISTORY:  Past Medical History  Diagnosis Date  . Hx of CABG   . Aortic stenosis   . Chronic atrial fibrillation ([place])   . CHF with unknown LVEF (HCC)     47 %  . Subclavian bypass stenosis ([place])   . Rheumatic fever   . Vitamin B12 deficiency   . OSA (obstructive sleep apnea)   . Hypertension   . Carotid artery disease ([place]) 1994    s/p left carotid endarerectomy   . Left ventricular dysfunction 2007    LVEF 47%     SURGICAL HISTORY: Past Surgical History  Procedure Laterality Date  . Heart bypass      Done in [place]  . Cardiac valve surgery  1994    replaced due to aortic stenosis, St. Jude mechanical prostesis  . Coronary artery bypass graft  1994    w SVG to RCA and SVG to circumflex  . Carotid endarterectomy Left 1997    subclavian bypass Done in [place]    SOCIAL HISTORY: Social History   Social History  . Marital Status: Married    Spouse Name: N/A  . Number of Children: N/A  . Years of Education: N/A   Occupational History  . Not on file.   Social History Main Topics  . Smoking status: Former Smoker -- 1.00 packs/day for 30 years    Types: Cigarettes    Quit date: 04/03/1992  . Smokeless tobacco:  Never Used  . Alcohol Use: Yes     Comment: 4-6 ounces of wine daily  . Drug Use: No  . Sexual Activity: Not on file   Other Topics Concern  . Not on file   Social History Narrative    FAMILY HISTORY: Family History  Problem Relation Age of Onset  . Cancer Father     lung   . Hypertension Mother     ALLERGIES:  is allergic to rifampin and vancomycin.  MEDICATIONS:  Current Outpatient Prescriptions  Medication Sig Dispense Refill  . carvedilol (COREG) 25 MG tablet Take 1 tablet (25 mg total) by mouth 2 (two) times daily with a meal. 180 tablet 3  . Cyanocobalamin (VITAMIN B12) 3000 MCG/ML LIQD Place 2,000 mcg under the tongue daily.    . enalapril (VASOTEC) 20 MG tablet TAKE 1 TABLET BY MOUTH EVERY DAY 90 tablet 1  . ezetimibe-simvastatin (VYTORIN) 10-20 MG per tablet Take 1 tablet by mouth at bedtime. 90 tablet 2  . fluocinonide cream (LIDEX) 9.82 % Apply 1 application topically 2 (two) times daily.     . folic acid (FOLVITE) 1 MG tablet Take 1 tablet (1 mg total) by mouth daily.    . furosemide (LASIX) 40 MG tablet Take 40 mg by mouth daily as needed (swelling).     Marland Kitchen glucose  blood (ONE TOUCH ULTRA TEST) test strip USE AS DIRECTED TO TEST BLOOD GLUCOSE  EVERY OTHER DAY DX: 250.00 50 each 2  . metFORMIN (GLUCOPHAGE-XR) 750 MG 24 hr tablet TAKE 3 TABLETS BY MOUTH EVERY DAY 270 tablet 1  . NON FORMULARY BIPAP    . NON FORMULARY ONE TOUCH ULTRA TEST STRIPS  AND LANCETS    . ONETOUCH DELICA LANCETS FINE MISC Use to obtain a blood specimen every other day Dx code 250.00 100 each 1  . potassium chloride (KLOR-CON M10) 10 MEQ tablet Take 1 tablet (10 mEq total) by mouth daily. 90 tablet 0  . PROAIR HFA 108 (90 BASE) MCG/ACT inhaler Inhale 2 puffs into the lungs every 6 (six) hours as needed. For chest congestion    . warfarin (COUMADIN) 5 MG tablet TAKE AS DIRECTED BY COUMADIN CLINIC 45 tablet 3   No current facility-administered medications for this visit.    REVIEW OF SYSTEMS:     10 point review of system is negative other than that mentioned above.  PHYSICAL EXAMINATION: ECOG PERFORMANCE STATUS: 1 - Symptomatic but completely ambulatory  Filed Vitals:   07/22/15 0903  BP: 117/59  Pulse: 79  Temp: 97.7 F (36.5 C)  Resp: 17   Filed Weights   07/22/15 0903  Weight: 276 lb 12.8 oz (125.556 kg)    GENERAL:alert, no distress and comfortable SKIN: skin color, texture, turgor are normal, no rashes no petechiae. EYES: normal, conjunctiva are pink and non-injected, sclera clear OROPHARYNX:no exudate, no erythema and lips, buccal mucosa, and tongue normal  NECK: supple, thyroid normal size, non-tender, without nodularity LYMPH:  no palpable lymphadenopathy in the cervical, axillary or inguinal LUNGS: clear to auscultation and percussion with normal breathing effort HEART: regular rate & rhythm and no murmurs and no lower extremity edema.clear click of his metallic aortic valve noted.  ABDOMEN:abdomen soft, non-tender and normal bowel sounds Musculoskeletal:no cyanosis of digits and no clubbing  PSYCH: alert & oriented x 3 with fluent speech NEURO: no focal motor/sensory deficits  LABORATORY DATA:   . CBC Latest Ref Rng 07/22/2015 03/23/2015 02/01/2015  WBC 4.0 - 10.3 10e3/uL 3.3(L) 3.5(L) 3.9(L)  Hemoglobin 13.0 - 17.1 g/dL 13.0 13.4 13.5  Hematocrit 38.4 - 49.9 % 41.0 41.9 42.0  Platelets 140 - 400 10e3/uL 67(L) 71(L) 67(L)    . CMP Latest Ref Rng 07/22/2015 03/23/2015 02/10/2015  Glucose 70 - 140 mg/dl 97 113 126(H)  BUN 7.0 - 26.0 mg/dL 19.3 23.0 23  Creatinine 0.7 - 1.3 mg/dL 1.0 1.0 0.99  Sodium 136 - 145 mEq/L 140 138 139  Potassium 3.5 - 5.1 mEq/L 5.6 No visable hemolysis(H) 4.8 4.3  Chloride 96 - 112 mEq/L - - 105  CO2 22 - 29 mEq/L _0 Calcium 8.4 - 10.4 mg/dL 9.4 9.3 9.4  Total Protein 6.4 - 8.3 g/dL 7.0 7.1 7.1  Total Bilirubin 0.20 - 1.20 mg/dL 1.61(H) 1.09 1.0  Alkaline Phos 40 - 150 U/L 133 93 88  AST 5 - 34 U/L _1 ALT  0 - 55 U/L _2 RADIOGRAPHIC STUDIES: I have personally reviewed the radiological images as listed and agreed with the findings in the report. No results found.  ASSESSMENT & PLAN   1) Thrombocytopenia. Moderate. 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 +  cannot rule out ITP element. Could have possible element of MDS. He has some pelgeroid neutrophils on his peripheral blood smear that are suggestive of possible MDS. Hepatitis B, and C serologies negative. No evidence of a lymphoproliferative syndrome on peripheral blood smear or clinical examination. SPEP showed no monoclonal protein.  Plan - patient's platelets remain stable. -If platelet counts drop below 50,000 might need to limit the INR fluctuations on Coumadin from 2-3 more  strictly. -Absolute alcohol cessation -Avoid NSAIDS and other medications known to cause thrombocytopenia especially ranitidine and other  H2 blockers, sulfa drugs, PPIs. -continue vitamin B12 2000 g sublingually daily and Folic acid 1 mg by mouth daily. -if worsening cytopenias might consider a bone marrow biopsy. -At this point we will have patient check his CBC every 6 months with his primary care physician and alternate this with a follow-up in our hematology clinic in one year unless any new concerns arise.  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 Has somewhat elevated LDH and low haptoglobin confirms the presence of low-grade hemolysis. Patient has no clinical evidence of hemoglobinuria. Peripheral blood smear showed minimal rare schistocytes. No micro-spherocytes to suggest autoimmune hemolysis.Coombs negative. 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 PLAN -aggressive B12  replacement - folic acid 1-2 mg po daily to maintain erythropoiesis in the setting of increased demand  from chronic hemolysis. -Bone marrow examination if worsening anemia or other cytopenia.  3) Hyperkalemia - likely due to potassium replacement , ACEI and non-compliance with lasix. Plan -Patient recommended to discontinue oral potassium. -He has leg swelling and has not been taking his Lasix as recommended and was asked to restart his Lasix . He was recommended to follow-up with his primary care physician for repeat BMP in 1 week to recheck his potassium levels. If there are still elevated he might need adjustment of his ACE inhibitor /treatment with Kayexalate. -Patient counseled on low potassium diet.  -Continue follow-up with primary care physician with CBC every 6 months .  Return to clinic with Dr. Irene Limbo in 12 months with repeat CBC, CMP. Patient counseled to call earlier if any new acute concerns or questions arise or if worsening thrombocytopenia or other cytopenias arise.  I appreciate the privilege of taking part in the care of this wonderful gentleman.  Total time spent 25 minutes more than 50% of the time in direct patient care counseling and coordination of care.  Andrew Lone MD [place] [facility] Physician [facility]  (Office):       [phone] (Work cell):  [phone] (Fax):           [phone]

## 2015-07-30 DIAGNOSIS — E875 Hyperkalemia: Secondary | ICD-10-CM | POA: Diagnosis not present

## 2015-08-11 ENCOUNTER — Other Ambulatory Visit (INDEPENDENT_AMBULATORY_CARE_PROVIDER_SITE_OTHER): Payer: Medicare Other

## 2015-08-11 DIAGNOSIS — E119 Type 2 diabetes mellitus without complications: Secondary | ICD-10-CM

## 2015-08-11 LAB — COMPREHENSIVE METABOLIC PANEL
ALK PHOS: 120 U/L — AB (ref 39–117)
ALT: 10 U/L (ref 0–53)
AST: 21 U/L (ref 0–37)
Albumin: 4.3 g/dL (ref 3.5–5.2)
BILIRUBIN TOTAL: 1.2 mg/dL (ref 0.2–1.2)
BUN: 24 mg/dL — AB (ref 6–23)
CO2: 26 mEq/L (ref 19–32)
Calcium: 9.5 mg/dL (ref 8.4–10.5)
Chloride: 104 mEq/L (ref 96–112)
Creatinine, Ser: 1.04 mg/dL (ref 0.40–1.50)
GFR: 73.35 mL/min (ref >60.00)
GLUCOSE: 109 mg/dL — AB (ref 70–99)
Potassium: 5.1 mEq/L (ref 3.5–5.1)
SODIUM: 139 meq/L (ref 135–145)
TOTAL PROTEIN: 7.3 g/dL (ref 6.0–8.3)

## 2015-08-11 LAB — LIPID PANEL
CHOLESTEROL: 109 mg/dL (ref 0–200)
HDL: 44 mg/dL (ref >39.00)
LDL Cholesterol: 51 mg/dL (ref 0–99)
NONHDL: 64.99
Total CHOL/HDL Ratio: 2
Triglycerides: 71 mg/dL (ref 0.0–149.0)
VLDL: 14.2 mg/dL (ref 0.0–40.0)

## 2015-08-11 LAB — HEMOGLOBIN A1C: HEMOGLOBIN A1C: 5.8 % (ref 4.6–6.5)

## 2015-08-16 ENCOUNTER — Encounter: Payer: Self-pay | Admitting: Endocrinology

## 2015-08-16 ENCOUNTER — Ambulatory Visit (INDEPENDENT_AMBULATORY_CARE_PROVIDER_SITE_OTHER): Payer: Medicare Other | Admitting: Endocrinology

## 2015-08-16 VITALS — BP 126/70 | HR 72 | Temp 97.4°F | Resp 16 | Ht 74.0 in | Wt 264.8 lb

## 2015-08-16 DIAGNOSIS — E119 Type 2 diabetes mellitus without complications: Secondary | ICD-10-CM | POA: Diagnosis not present

## 2015-08-16 NOTE — Progress Notes (Signed)
Patient ID: [name], male   DOB: 09-21-1935, 80 y.o.   MRN: [id number]   Reason for Appointment: Diabetes follow-up   History of Present Illness   Diagnosis: Type 2 DIABETES MELITUS date of onset: 07/2010       Oral hypoglycemic drugs: metformin 2250 mg daily       Side effects from medications: None  He was started on metformin when his baseline A1c was 6.9% and he had significant obesity He was also sent for diabetes education. Since then A1c had been consistently upper normal  Recent history:  Oral hypoglycemic drugs: Metformin ER 750 mg, 3 daily  His A1c again is excellent at 5.8%  Tolerating high dose metformin without any GI side effects, has been on the same regimen for the last few years He has difficulty losing weight and difficult to assess his weight as he has variable amounts of fluid He thinks he is fairly consistent with diet   Monitors blood glucose:  every other day  Mean values apply above for all meters except median for One Touch  PRE-MEAL Fasting Lunch Dinner Bedtime Overall  Glucose range: 97-136    105-137    Mean/median: 103    114  111    Fasting glucose in the lab is 109  Meals: breakfast:occasional cereal otherwise muffin and peanut butter; occasionally sweets for snacks  Physical activity: exercise: walking more, limited by leg pain            Dietician visit: Most recent:2012          Last eye exam: annual    Wt Readings from Last 3 Encounters:  08/16/15 264 lb 12.8 oz (120.112 kg)  07/22/15 276 lb 12.8 oz (125.556 kg)  03/23/15 247 lb 9.6 oz (112.311 kg)   Lab Results  Component Value Date   HGBA1C 5.8 08/11/2015   HGBA1C 5.5 02/10/2015   HGBA1C 5.4 08/11/2014   Lab Results  Component Value Date   MICROALBUR 13.0* 02/10/2015   LDLCALC 51 08/11/2015   CREATININE 1.04 08/11/2015    LABS:  Lab on 08/11/2015  Component Date Value Ref Range Status  . Hgb A1c MFr Bld 08/11/2015 5.8  4.6 - 6.5 % Final   Glycemic  Control Guidelines for People with Diabetes:Non Diabetic:  <6%Goal of Therapy: <7%Additional Action Suggested:  >8%   . Sodium 08/11/2015 139  135 - 145 mEq/L Final  . Potassium 08/11/2015 5.1  3.5 - 5.1 mEq/L Final  . Chloride 08/11/2015 104  96 - 112 mEq/L Final  . CO2 08/11/2015 26  19 - 32 mEq/L Final  . Glucose, Bld 08/11/2015 109* 70 - 99 mg/dL Final  . BUN 08/11/2015 24* 6 - 23 mg/dL Final  . Creatinine, Ser 08/11/2015 1.04  0.40 - 1.50 mg/dL Final  . Total Bilirubin 08/11/2015 1.2  0.2 - 1.2 mg/dL Final  . Alkaline Phosphatase 08/11/2015 120* 39 - 117 U/L Final  . AST 08/11/2015 21  0 - 37 U/L Final  . ALT 08/11/2015 10  0 - 53 U/L Final  . Total Protein 08/11/2015 7.3  6.0 - 8.3 g/dL Final  . Albumin 08/11/2015 4.3  3.5 - 5.2 g/dL Final  . Calcium 08/11/2015 9.5  8.4 - 10.5 mg/dL Final  . GFR 08/11/2015 73.35  >60.00 mL/min Final  . Cholesterol 08/11/2015 109  0 - 200 mg/dL Final   ATP III Classification       Desirable:  < 200 mg/dL  Borderline High:  200 - 239 mg/dL          High:  > = 240 mg/dL  . Triglycerides 08/11/2015 71.0  0.0 - 149.0 mg/dL Final   Normal:  <150 mg/dLBorderline High:  150 - 199 mg/dL  . HDL 08/11/2015 44.00  >39.00 mg/dL Final  . VLDL 08/11/2015 14.2  0.0 - 40.0 mg/dL Final  . LDL Cholesterol 08/11/2015 51  0 - 99 mg/dL Final  . Total CHOL/HDL Ratio 08/11/2015 2   Final                  Men          Women1/2 Average Risk     3.4          3.3Average Risk          5.0          4.42X Average Risk          9.6          7.13X Average Risk          15.0          11.0                      . NonHDL 08/11/2015 64.99   Final   NOTE:  Non-HDL goal should be 30 mg/dL higher than patient's LDL goal (i.e. LDL goal of < 70 mg/dL, would have non-HDL goal of < 100 mg/dL)      Medication List       This list is accurate as of: 08/16/15  9:22 AM.  Always use your most recent med list.               carvedilol 25 MG tablet  Commonly known as:  COREG    Take 1 tablet (25 mg total) by mouth 2 (two) times daily with a meal.     enalapril 20 MG tablet  Commonly known as:  VASOTEC  TAKE 1 TABLET BY MOUTH EVERY DAY     ezetimibe-simvastatin 10-20 MG tablet  Commonly known as:  VYTORIN  Take 1 tablet by mouth at bedtime.     fluocinonide cream 0.05 %  Commonly known as:  LIDEX  Apply 1 application topically 2 (two) times daily.     folic acid 1 MG tablet  Commonly known as:  FOLVITE  Take 1 tablet (1 mg total) by mouth daily.     furosemide 40 MG tablet  Commonly known as:  LASIX  Take 40 mg by mouth daily as needed (swelling).     glucose blood test strip  Commonly known as:  ONE TOUCH ULTRA TEST  USE AS DIRECTED TO TEST BLOOD GLUCOSE  EVERY OTHER DAY DX: 250.00     metFORMIN 750 MG 24 hr tablet  Commonly known as:  GLUCOPHAGE-XR  TAKE 3 TABLETS BY MOUTH EVERY DAY     NON FORMULARY  BIPAP     NON FORMULARY  ONE TOUCH ULTRA TEST STRIPS  AND LANCETS     ONETOUCH DELICA LANCETS FINE Misc  Use to obtain a blood specimen every other day Dx code 250.00     PROAIR HFA 108 (90 Base) MCG/ACT inhaler  Generic drug:  albuterol  Inhale 2 puffs into the lungs every 6 (six) hours as needed. For chest congestion     Vitamin B12 3000 MCG/ML Liqd  Place 2,000 mcg under the tongue daily.     warfarin 5 MG  tablet  Commonly known as:  COUMADIN  TAKE AS DIRECTED BY COUMADIN CLINIC        Allergies:  Allergies  Allergen Reactions  . Rifampin     REACTION: rash (unclear if due to vanco or rifampin)  . Vancomycin     REACTION: rash (unclear if due to rifampin or vanco)    Past Medical History  Diagnosis Date  . Hx of CABG   . Aortic stenosis   . Chronic atrial fibrillation ([place])   . CHF with unknown LVEF (HCC)     47 %  . Subclavian bypass stenosis ([place])   . Rheumatic fever   . Vitamin B12 deficiency   . OSA (obstructive sleep apnea)   . Hypertension   . Carotid artery disease ([place]) 1994    s/p left carotid  endarerectomy   . Left ventricular dysfunction 2007    LVEF 47%     Past Surgical History  Procedure Laterality Date  . Heart bypass      Done in [place]  . Cardiac valve surgery  1994    replaced due to aortic stenosis, St. Jude mechanical prostesis  . Coronary artery bypass graft  1994    w SVG to RCA and SVG to circumflex  . Carotid endarterectomy Left 1997    subclavian bypass Done in [place]    Family History  Problem Relation Age of Onset  . Cancer Father     lung   . Hypertension Mother     Social History:  reports that he quit smoking about 23 years ago. His smoking use included Cigarettes. He has a 30 pack-year smoking history. He has never used smokeless tobacco. He reports that he drinks alcohol. He reports that he does not use illicit drugs.  Review of Systems    HYPERTENSION:  he is on multiple drugs with good control and normal renal function  HYPERLIPIDEMIA:         The lipid abnormality consists of elevated LDL treated with Vytorin and followed by PCP and last LDL was 60 in June 2016 .    Lab Results  Component Value Date   CHOL 109 08/11/2015   HDL 44.00 08/11/2015   LDLCALC 51 08/11/2015   TRIG 71.0 08/11/2015   CHOLHDL 2 08/11/2015   He has dependent pedal edema, is on Lasix, Now not taking potassium  Last foot exam in 11/16   Examination:   BP 126/70 mmHg  Pulse 72  Temp(Src) 97.4 F (36.3 C)  Resp 16  Ht 6\' 2"  (1.88 m)  Wt 264 lb 12.8 oz (120.112 kg)  BMI 33.98 kg/m2  SpO2 95%  Body mass index is 33.98 kg/(m^2).    2+ Edema present  Assesment/PLAN:   1. Diabetes type 2   The patient's diabetes is overall well controlled with normal A1c again in the normal range, minimally higher at 5.8  He has consistent control of his diabetes since his diagnosis in 2012 with maximum dose metformin alone He has done some walking and is doing better with this Diet is usually fairly good, no high postprandial readings  He will continue on  maximum dose metformin and call if blood sugars are unusually high Encouraged him to exercise as much as tolerated  2.  Hypertension: Well controlled   3.  Persistent leg edema, to continue follow-up with PCP and cardiologist  Follow-up in 6 months  [name] 08/16/2015, 9:22 AM   Note: This office note was prepared with Dragon voice recognition  system technology. Any transcriptional errors that result from this process are unintentional.

## 2015-08-16 NOTE — Patient Instructions (Signed)
Check blood sugars on waking up 2-3 times a week Also check blood sugars about 2 hours after a meal and do this after different meals by rotation  Recommended blood sugar levels on waking up is 90-130 and about 2 hours after meal is 130-160  Please bring your blood sugar monitor to each visit, thank you  

## 2015-09-01 ENCOUNTER — Other Ambulatory Visit: Payer: Self-pay | Admitting: Interventional Cardiology

## 2015-09-01 ENCOUNTER — Ambulatory Visit (INDEPENDENT_AMBULATORY_CARE_PROVIDER_SITE_OTHER): Payer: Medicare Other | Admitting: *Deleted

## 2015-09-01 DIAGNOSIS — I482 Chronic atrial fibrillation, unspecified: Secondary | ICD-10-CM

## 2015-09-01 DIAGNOSIS — I359 Nonrheumatic aortic valve disorder, unspecified: Secondary | ICD-10-CM | POA: Diagnosis not present

## 2015-09-01 DIAGNOSIS — Z5181 Encounter for therapeutic drug level monitoring: Secondary | ICD-10-CM | POA: Diagnosis not present

## 2015-09-01 LAB — POCT INR: INR: 3.5

## 2015-09-04 ENCOUNTER — Other Ambulatory Visit: Payer: Self-pay | Admitting: Interventional Cardiology

## 2015-09-21 ENCOUNTER — Other Ambulatory Visit: Payer: Self-pay | Admitting: Endocrinology

## 2015-09-21 DIAGNOSIS — G4733 Obstructive sleep apnea (adult) (pediatric): Secondary | ICD-10-CM | POA: Diagnosis not present

## 2015-09-21 DIAGNOSIS — I10 Essential (primary) hypertension: Secondary | ICD-10-CM | POA: Diagnosis not present

## 2015-09-21 DIAGNOSIS — D696 Thrombocytopenia, unspecified: Secondary | ICD-10-CM | POA: Diagnosis not present

## 2015-09-21 DIAGNOSIS — I2510 Atherosclerotic heart disease of native coronary artery without angina pectoris: Secondary | ICD-10-CM | POA: Diagnosis not present

## 2015-09-21 DIAGNOSIS — E538 Deficiency of other specified B group vitamins: Secondary | ICD-10-CM | POA: Diagnosis not present

## 2015-09-21 DIAGNOSIS — Z79899 Other long term (current) drug therapy: Secondary | ICD-10-CM | POA: Diagnosis not present

## 2015-10-13 ENCOUNTER — Ambulatory Visit (INDEPENDENT_AMBULATORY_CARE_PROVIDER_SITE_OTHER): Payer: Medicare Other | Admitting: *Deleted

## 2015-10-13 DIAGNOSIS — I482 Chronic atrial fibrillation, unspecified: Secondary | ICD-10-CM

## 2015-10-13 DIAGNOSIS — I359 Nonrheumatic aortic valve disorder, unspecified: Secondary | ICD-10-CM | POA: Diagnosis not present

## 2015-10-13 DIAGNOSIS — Z5181 Encounter for therapeutic drug level monitoring: Secondary | ICD-10-CM

## 2015-10-13 LAB — POCT INR: INR: 2.9

## 2015-10-20 ENCOUNTER — Encounter: Payer: Self-pay | Admitting: Interventional Cardiology

## 2015-11-24 ENCOUNTER — Ambulatory Visit (INDEPENDENT_AMBULATORY_CARE_PROVIDER_SITE_OTHER): Payer: Medicare Other | Admitting: *Deleted

## 2015-11-24 DIAGNOSIS — I359 Nonrheumatic aortic valve disorder, unspecified: Secondary | ICD-10-CM

## 2015-11-24 DIAGNOSIS — Z5181 Encounter for therapeutic drug level monitoring: Secondary | ICD-10-CM

## 2015-11-24 DIAGNOSIS — I482 Chronic atrial fibrillation, unspecified: Secondary | ICD-10-CM

## 2015-11-24 LAB — POCT INR: INR: 2.7

## 2015-11-29 ENCOUNTER — Other Ambulatory Visit: Payer: Self-pay | Admitting: Interventional Cardiology

## 2015-12-18 ENCOUNTER — Other Ambulatory Visit: Payer: Self-pay | Admitting: Interventional Cardiology

## 2015-12-22 ENCOUNTER — Encounter: Payer: Self-pay | Admitting: Cardiology

## 2016-01-05 ENCOUNTER — Ambulatory Visit (INDEPENDENT_AMBULATORY_CARE_PROVIDER_SITE_OTHER): Payer: Medicare Other | Admitting: *Deleted

## 2016-01-05 DIAGNOSIS — I482 Chronic atrial fibrillation, unspecified: Secondary | ICD-10-CM

## 2016-01-05 DIAGNOSIS — Z5181 Encounter for therapeutic drug level monitoring: Secondary | ICD-10-CM | POA: Diagnosis not present

## 2016-01-05 DIAGNOSIS — I359 Nonrheumatic aortic valve disorder, unspecified: Secondary | ICD-10-CM

## 2016-01-05 LAB — POCT INR: INR: 3.2

## 2016-01-06 ENCOUNTER — Ambulatory Visit: Payer: Medicare Other | Admitting: Cardiology

## 2016-02-11 ENCOUNTER — Other Ambulatory Visit (INDEPENDENT_AMBULATORY_CARE_PROVIDER_SITE_OTHER): Payer: Medicare Other

## 2016-02-11 DIAGNOSIS — E119 Type 2 diabetes mellitus without complications: Secondary | ICD-10-CM | POA: Diagnosis not present

## 2016-02-11 LAB — COMPREHENSIVE METABOLIC PANEL
ALBUMIN: 4.3 g/dL (ref 3.5–5.2)
ALT: 10 U/L (ref 0–53)
AST: 18 U/L (ref 0–37)
Alkaline Phosphatase: 130 U/L — ABNORMAL HIGH (ref 39–117)
BUN: 30 mg/dL — ABNORMAL HIGH (ref 6–23)
CALCIUM: 9.3 mg/dL (ref 8.4–10.5)
CHLORIDE: 108 meq/L (ref 96–112)
CO2: 24 mEq/L (ref 19–32)
CREATININE: 0.97 mg/dL (ref 0.40–1.50)
GFR: 79.38 mL/min (ref >60.00)
Glucose, Bld: 105 mg/dL — ABNORMAL HIGH (ref 70–99)
POTASSIUM: 4.3 meq/L (ref 3.5–5.1)
Sodium: 140 mEq/L (ref 135–145)
Total Bilirubin: 1.5 mg/dL — ABNORMAL HIGH (ref 0.2–1.2)
Total Protein: 7.2 g/dL (ref 6.0–8.3)

## 2016-02-11 LAB — MICROALBUMIN / CREATININE URINE RATIO
CREATININE, U: 144.5 mg/dL
MICROALB UR: 24.8 mg/dL — AB (ref 0.0–1.9)
MICROALB/CREAT RATIO: 17.2 mg/g (ref 0.0–30.0)

## 2016-02-11 LAB — HEMOGLOBIN A1C: Hgb A1c MFr Bld: 5.4 % (ref 4.6–6.5)

## 2016-02-16 ENCOUNTER — Encounter: Payer: Self-pay | Admitting: Endocrinology

## 2016-02-16 ENCOUNTER — Ambulatory Visit (INDEPENDENT_AMBULATORY_CARE_PROVIDER_SITE_OTHER): Payer: Medicare Other | Admitting: Endocrinology

## 2016-02-16 ENCOUNTER — Ambulatory Visit (INDEPENDENT_AMBULATORY_CARE_PROVIDER_SITE_OTHER): Payer: Medicare Other | Admitting: *Deleted

## 2016-02-16 VITALS — BP 118/68 | HR 70 | Ht 74.0 in | Wt 270.0 lb

## 2016-02-16 DIAGNOSIS — I359 Nonrheumatic aortic valve disorder, unspecified: Secondary | ICD-10-CM

## 2016-02-16 DIAGNOSIS — I482 Chronic atrial fibrillation, unspecified: Secondary | ICD-10-CM

## 2016-02-16 DIAGNOSIS — E119 Type 2 diabetes mellitus without complications: Secondary | ICD-10-CM | POA: Diagnosis not present

## 2016-02-16 DIAGNOSIS — Z5181 Encounter for therapeutic drug level monitoring: Secondary | ICD-10-CM | POA: Diagnosis not present

## 2016-02-16 DIAGNOSIS — Z23 Encounter for immunization: Secondary | ICD-10-CM

## 2016-02-16 LAB — POCT INR: INR: 3.5

## 2016-02-16 NOTE — Progress Notes (Signed)
Patient ID: [name], male   DOB: 01-09-1936, 80 y.o.   MRN: [id number]   Reason for Appointment: Diabetes follow-up   History of Present Illness   Diagnosis: Type 2 DIABETES MELITUS date of onset: 07/2010       Oral hypoglycemic drugs: metformin 2250 mg daily       Side effects from medications: None  He was started on metformin when his baseline A1c was 6.9% and he had significant obesity He was also sent for diabetes education. Since then A1c had been consistently upper normal  Recent history:  Oral hypoglycemic drugs: Metformin ER 750 mg, 3 daily  His A1c again is excellent at 5.4, previously 5.8%  Tolerating high dose metformin without any GI side effects, has been on the same regimen for the last few years He has difficulty losing weight  Appears to have gained back some weight Not able to do much physical activity because of leg discomfort and balance issues He thinks he is fairly consistent with diet   Monitors blood glucose:  every other day  Glucose RANGE 97-128, mostly checking around 9 PM, fasting today 127   Fasting glucose in the lab is 105  Meals: breakfast:occasional cereal otherwise muffin and peanut butter; occasionally sweets for snacks  Physical activity: exercise: walking more, limited by leg pain            Dietician visit: Most recent:2012          Last eye exam: annual    Wt Readings from Last 3 Encounters:  02/16/16 270 lb (122.5 kg)  08/16/15 264 lb 12.8 oz (120.1 kg)  07/22/15 276 lb 12.8 oz (125.6 kg)   Lab Results  Component Value Date   HGBA1C 5.4 02/11/2016   HGBA1C 5.8 08/11/2015   HGBA1C 5.5 02/10/2015   Lab Results  Component Value Date   MICROALBUR 24.8 (H) 02/11/2016   LDLCALC 51 08/11/2015   CREATININE 0.97 02/11/2016    LABS:  Anti-coag visit on 02/16/2016  Component Date Value Ref Range Status  . INR 02/16/2016 3.5   Final  Lab on 02/11/2016  Component Date Value Ref Range Status  . Hgb A1c MFr Bld  02/11/2016 5.4  4.6 - 6.5 % Final  . Sodium 02/11/2016 140  135 - 145 mEq/L Final  . Potassium 02/11/2016 4.3  3.5 - 5.1 mEq/L Final  . Chloride 02/11/2016 108  96 - 112 mEq/L Final  . CO2 02/11/2016 24  19 - 32 mEq/L Final  . Glucose, Bld 02/11/2016 105* 70 - 99 mg/dL Final  . BUN 02/11/2016 30* 6 - 23 mg/dL Final  . Creatinine, Ser 02/11/2016 0.97  0.40 - 1.50 mg/dL Final  . Total Bilirubin 02/11/2016 1.5* 0.2 - 1.2 mg/dL Final  . Alkaline Phosphatase 02/11/2016 130* 39 - 117 U/L Final  . AST 02/11/2016 18  0 - 37 U/L Final  . ALT 02/11/2016 10  0 - 53 U/L Final  . Total Protein 02/11/2016 7.2  6.0 - 8.3 g/dL Final  . Albumin 02/11/2016 4.3  3.5 - 5.2 g/dL Final  . Calcium 02/11/2016 9.3  8.4 - 10.5 mg/dL Final  . GFR 02/11/2016 79.38  >60.00 mL/min Final  . Microalb, Ur 02/11/2016 24.8* 0.0 - 1.9 mg/dL Final  . Creatinine,U 02/11/2016 144.5  mg/dL Final  . Microalb Creat Ratio 02/11/2016 17.2  0.0 - 30.0 mg/g Final      Medication List       Accurate as of 02/16/16  9:10  AM. Always use your most recent med list.          carvedilol 25 MG tablet Commonly known as:  COREG Take 1 tablet (25 mg total) by mouth 2 (two) times daily with a meal.   enalapril 20 MG tablet Commonly known as:  VASOTEC TAKE 1 TABLET BY MOUTH EVERY DAY   ezetimibe-simvastatin 10-20 MG tablet Commonly known as:  VYTORIN Take 1 tablet by mouth at bedtime.   fluocinonide cream 0.05 % Commonly known as:  LIDEX Apply 1 application topically 2 (two) times daily.   folic acid 1 MG tablet Commonly known as:  FOLVITE Take 1 tablet (1 mg total) by mouth daily.   furosemide 40 MG tablet Commonly known as:  LASIX Take 40 mg by mouth daily as needed (swelling).   glucose blood test strip Commonly known as:  ONE TOUCH ULTRA TEST USE AS DIRECTED TO TEST BLOOD GLUCOSE  EVERY OTHER DAY DX: 250.00   metFORMIN 750 MG 24 hr tablet Commonly known as:  GLUCOPHAGE-XR TAKE 3 TABLETS BY MOUTH EVERY DAY     NON FORMULARY BIPAP   NON FORMULARY ONE TOUCH ULTRA TEST STRIPS  AND LANCETS   ONETOUCH DELICA LANCETS FINE Misc Use to obtain a blood specimen every other day Dx code 250.00   PROAIR HFA 108 (90 Base) MCG/ACT inhaler Generic drug:  albuterol Inhale 2 puffs into the lungs every 6 (six) hours as needed. For chest congestion   Vitamin B12 3000 MCG/ML Liqd Place 2,000 mcg under the tongue daily.   warfarin 5 MG tablet Commonly known as:  COUMADIN TAKE AS DIRECTED BY COUMADIN CLINIC       Allergies:  Allergies  Allergen Reactions  . Rifampin     REACTION: rash (unclear if due to vanco or rifampin)  . Vancomycin     REACTION: rash (unclear if due to rifampin or vanco)    Past Medical History:  Diagnosis Date  . Aortic stenosis   . Carotid artery disease ([place]) 1994   s/p left carotid endarerectomy   . CHF with unknown LVEF ([facility])    47 %  . Chronic atrial fibrillation ([place])   . Hx of CABG   . Hypertension   . Left ventricular dysfunction 2007   LVEF 47%   . OSA (obstructive sleep apnea)   . Rheumatic fever   . Subclavian bypass stenosis ([place])   . Vitamin B12 deficiency     Past Surgical History:  Procedure Laterality Date  . [place]   replaced due to aortic stenosis, [employer] mechanical prostesis  . CAROTID ENDARTERECTOMY Left 1997   subclavian bypass Done in [place]  . CORONARY ARTERY BYPASS GRAFT  1994   w SVG to RCA and SVG to circumflex  . heart bypass     Done in [place]    Family History  Problem Relation Age of Onset  . Cancer Father     lung   . Hypertension Mother     Social History:  reports that he quit smoking about 23 years ago. His smoking use included Cigarettes. He has a 30.00 pack-year smoking history. He has never used smokeless tobacco. He reports that he drinks alcohol. He reports that he does not use drugs.  Review of Systems    HYPERTENSION:  he is on multiple drugs Including enalapril with good control  and normal renal function  Lab Results  Component Value Date   CREATININE 0.97 02/11/2016   BUN  30 (H) 02/11/2016   NA 140 02/11/2016   K 4.3 02/11/2016   CL 108 02/11/2016   CO2 24 02/11/2016     HYPERLIPIDEMIA:         The lipid abnormality consists of elevated LDL treated with Vytorin and followed by PCP    Lab Results  Component Value Date   CHOL 109 08/11/2015   HDL 44.00 08/11/2015   LDLCALC 51 08/11/2015   TRIG 71.0 08/11/2015   CHOLHDL 2 08/11/2015   He has dependent pedal edema, is Not on Lasix since he does not like the diuresis associated with this  Last foot exam in 11/17 showing peripheral vascular disease and only mild neuropathy  Patient had some bleeding from his leg when he was getting off from the exam table, dressing done. Advised him to apply pressure because of his high INR    Examination:   BP 118/68   Pulse 70   Ht 6\' 2"  (1.88 m)   Wt 270 lb (122.5 kg)   SpO2 95%   BMI 34.67 kg/m   Body mass index is 34.67 kg/m.    Diabetic Foot Exam - Simple   Simple Foot Form Diabetic Foot exam was performed with the following findings:  Yes 02/16/2016  9:15 AM  Visual Inspection No deformities, no ulcerations, no other skin breakdown bilaterally:  Yes Sensation Testing See comments:  Yes Pulse Check See comments:  Yes Comments Absent pedal pulses Relatively less monofilament sensation on the dorsum of distal toes, normal on the left plantar surfaces Mild pedal edema, 2+ lower leg edema present     Assesment/PLAN:   1. Diabetes type 2   The patient's diabetes is overall well controlled with normal A1c again in the normal range, Improved at 5.4  He has consistent control of his diabetes since his diagnosis in 2012 with maximum dose metformin alone Diet is usually fairly good However has gained back some weight Unable to do much exercise His One Touch ultra meter is relatively lower than he thinks he may have expired test strips  He will  continue on maximum dose metformin and call if blood sugars are unusually high  New One Touch Verio meter given  2.  Hypertension: Well controlled   3.  Persistent leg edema Which may be partly related to venous insufficiency, Patient is not compliant with taking Lasix; to continue follow-up with PCP and cardiologist  Follow-up in 6 months  Stacyann Mcconaughy 02/16/2016, 9:10 AM   Note: This office note was prepared with Estate agent. Any transcriptional errors that result from this process are unintentional.

## 2016-02-18 ENCOUNTER — Encounter: Payer: Self-pay | Admitting: Internal Medicine

## 2016-02-18 ENCOUNTER — Ambulatory Visit (INDEPENDENT_AMBULATORY_CARE_PROVIDER_SITE_OTHER): Payer: Medicare Other | Admitting: Internal Medicine

## 2016-02-18 DIAGNOSIS — S81811A Laceration without foreign body, right lower leg, initial encounter: Secondary | ICD-10-CM

## 2016-02-18 DIAGNOSIS — R600 Localized edema: Secondary | ICD-10-CM | POA: Insufficient documentation

## 2016-02-18 DIAGNOSIS — Z7901 Long term (current) use of anticoagulants: Secondary | ICD-10-CM | POA: Diagnosis not present

## 2016-02-18 DIAGNOSIS — R609 Edema, unspecified: Secondary | ICD-10-CM | POA: Diagnosis not present

## 2016-02-18 DIAGNOSIS — S81801A Unspecified open wound, right lower leg, initial encounter: Secondary | ICD-10-CM | POA: Insufficient documentation

## 2016-02-18 MED ORDER — FUROSEMIDE 40 MG PO TABS: 40.0000 mg | ORAL_TABLET | Freq: Every day | ORAL | 0 refills | Status: DC

## 2016-02-18 MED ORDER — SILVER SULFADIAZINE 1 % EX CREA: 1.0000 "application " | TOPICAL_CREAM | Freq: Every day | CUTANEOUS | 0 refills | Status: DC

## 2016-02-18 MED ORDER — CEPHALEXIN 500 MG PO CAPS: 500.0000 mg | ORAL_CAPSULE | Freq: Four times a day (QID) | ORAL | 0 refills | Status: DC

## 2016-02-18 NOTE — Assessment & Plan Note (Signed)
R distal shin bleeding wound: he cut his leg on Wed while getting off the exam table.  He is on coumadin with INR 3.2 on Wed (at goal). Repaired w/3 sutures Silvadene cream, Keflex RTC 2 weeks DT up to date

## 2016-02-18 NOTE — Assessment & Plan Note (Signed)
He is on coumadin with INR 3.2 on Wed (at goal).

## 2016-02-18 NOTE — Progress Notes (Signed)
Subjective:  Patient ID: [name], male    DOB: 05/28/1935  Age: 80 y.o. MRN: [id number]  CC: No chief complaint on file.   HPI [name] presents for a R distal shin bleeding wound: he cut his leg on Wed while getting off the exam table.  He is on coumadin with INR 3.2 on Wed (at goal). He was taking Lasix for swelling but quit a few weeks ago due to peeing too much. We were asked to see the pt apply sutures if needed.   Outpatient Medications Prior to Visit  Medication Sig Dispense Refill  . carvedilol (COREG) 25 MG tablet Take 1 tablet (25 mg total) by mouth 2 (two) times daily with a meal. 180 tablet 3  . Cyanocobalamin (VITAMIN B12) 3000 MCG/ML LIQD Place 2,000 mcg under the tongue daily.    . enalapril (VASOTEC) 20 MG tablet TAKE 1 TABLET BY MOUTH EVERY DAY 90 tablet 0  . ezetimibe-simvastatin (VYTORIN) 10-20 MG per tablet Take 1 tablet by mouth at bedtime. 90 tablet 2  . fluocinonide cream (LIDEX) 2.42 % Apply 1 application topically 2 (two) times daily.     . folic acid (FOLVITE) 1 MG tablet Take 1 tablet (1 mg total) by mouth daily.    . furosemide (LASIX) 40 MG tablet Take 40 mg by mouth daily as needed (swelling).     Marland Kitchen glucose blood (ONE TOUCH ULTRA TEST) test strip USE AS DIRECTED TO TEST BLOOD GLUCOSE  EVERY OTHER DAY DX: 250.00 50 each 2  . metFORMIN (GLUCOPHAGE-XR) 750 MG 24 hr tablet TAKE 3 TABLETS BY MOUTH EVERY DAY 270 tablet 1  . NON FORMULARY BIPAP    . NON FORMULARY ONE TOUCH ULTRA TEST STRIPS  AND LANCETS    . ONETOUCH DELICA LANCETS FINE MISC Use to obtain a blood specimen every other day Dx code 250.00 100 each 1  . PROAIR HFA 108 (90 BASE) MCG/ACT inhaler Inhale 2 puffs into the lungs every 6 (six) hours as needed. For chest congestion    . warfarin (COUMADIN) 5 MG tablet TAKE AS DIRECTED BY COUMADIN CLINIC 45 tablet 3   No facility-administered medications prior to visit.     ROS Review of Systems  Constitutional: Positive for fatigue. Negative for  chills.  Respiratory: Negative for chest tightness and wheezing.   Cardiovascular: Positive for leg swelling. Negative for chest pain.  Gastrointestinal: Negative for abdominal pain.  Musculoskeletal: Positive for gait problem.  Skin: Positive for color change and wound.  Neurological: Negative for light-headedness.    Objective:  BP 140/80   Pulse 80   Wt 268 lb (121.6 kg)   SpO2 90%   BMI 34.41 kg/m   BP Readings from Last 3 Encounters:  02/18/16 140/80  02/16/16 118/68  08/16/15 126/70    Wt Readings from Last 3 Encounters:  02/18/16 268 lb (121.6 kg)  02/16/16 270 lb (122.5 kg)  08/16/15 264 lb 12.8 oz (120.1 kg)    Physical Exam  Constitutional: He is oriented to person, place, and time. He appears well-developed. No distress.  NAD  HENT:  Mouth/Throat: Oropharynx is clear and moist.  Eyes: Conjunctivae are normal. Pupils are equal, round, and reactive to light.  Neck: Normal range of motion. No JVD present. No thyromegaly present.  Cardiovascular: Intact distal pulses.  Exam reveals no gallop and no friction rub.   No murmur heard. Pulmonary/Chest: Effort normal and breath sounds normal. No respiratory distress. He has no wheezes. He has no  rales. He exhibits no tenderness.  Abdominal: Soft. Bowel sounds are normal. He exhibits no distension and no mass. There is no tenderness. There is no rebound and no guarding.  Musculoskeletal: Normal range of motion. He exhibits edema and tenderness.  Lymphadenopathy:    He has no cervical adenopathy.  Neurological: He is alert and oriented to person, place, and time. He has normal reflexes. No cranial nerve deficit. He exhibits normal muscle tone. He displays a negative Romberg sign. Coordination abnormal. Gait normal.  Skin: Skin is warm and dry. Rash noted. There is erythema. No pallor.  Psychiatric: He has a normal mood and affect. His behavior is normal. Judgment and thought content normal.  irreg irreg HR V shaped deep  wound with 3x3 cm sides and a flap 2+ B edema with chronic distal shin erythema  Procedure: laceration repair Indication: deep skin laceration Risks including infection, bleeding on coumadin, scar formation, wound opening, flap necrosis as well as benefits were explained to the patient in details. Wound was anesthetized with 3 cc of 2% Lidocaine with epinephrine and irrigated with the rest of the anesthetic. Skin was cleaned with betadine and dressed with sterile fenestrated field.   3 simple sutures with 3.0 nylon were applied.  Wound was dressed with silvadene  and dressed with gauze and ACE Tolerated well. Complications: bleeding (stopped).   Lab Results  Component Value Date   HGBA1C 5.4 02/11/2016     Assessment & Plan:   There are no diagnoses linked to this encounter. I am having Mr. Lacivita maintain his furosemide, NON FORMULARY, fluocinonide cream, NON FORMULARY, ezetimibe-simvastatin, glucose blood, ONETOUCH DELICA LANCETS FINE, PROAIR HFA, Vitamin V69, folic acid, carvedilol, metFORMIN, warfarin, and enalapril.  No orders of the defined types were placed in this encounter.    Follow-up: No Follow-up on file.  Walker Kehr, MD

## 2016-02-18 NOTE — Patient Instructions (Signed)
Wound instructions: clean wound daily with soap and water, pat dry with a gauze or a kleenex tissue. Dress with antibiotic ointment and a band aid. Return to clinic for suture removal in 12-14 days.

## 2016-02-18 NOTE — Progress Notes (Signed)
Pre visit review using our clinic review tool, if applicable. No additional management support is needed unless otherwise documented below in the visit note. 

## 2016-02-18 NOTE — Assessment & Plan Note (Signed)
Chronic severe edema B LE: re-start Lasix qd F/u w/a new PCP at [facility]

## 2016-02-23 ENCOUNTER — Encounter: Payer: Self-pay | Admitting: Interventional Cardiology

## 2016-02-23 ENCOUNTER — Ambulatory Visit (INDEPENDENT_AMBULATORY_CARE_PROVIDER_SITE_OTHER): Payer: Medicare Other | Admitting: Interventional Cardiology

## 2016-02-23 VITALS — BP 112/68 | HR 64 | Ht 74.0 in | Wt 259.0 lb

## 2016-02-23 DIAGNOSIS — I482 Chronic atrial fibrillation: Secondary | ICD-10-CM

## 2016-02-23 DIAGNOSIS — Z952 Presence of prosthetic heart valve: Secondary | ICD-10-CM

## 2016-02-23 DIAGNOSIS — I4821 Permanent atrial fibrillation: Secondary | ICD-10-CM

## 2016-02-23 DIAGNOSIS — I2510 Atherosclerotic heart disease of native coronary artery without angina pectoris: Secondary | ICD-10-CM | POA: Diagnosis not present

## 2016-02-23 DIAGNOSIS — I5032 Chronic diastolic (congestive) heart failure: Secondary | ICD-10-CM | POA: Diagnosis not present

## 2016-02-23 DIAGNOSIS — E7849 Other hyperlipidemia: Secondary | ICD-10-CM

## 2016-02-23 DIAGNOSIS — Z7901 Long term (current) use of anticoagulants: Secondary | ICD-10-CM

## 2016-02-23 DIAGNOSIS — E784 Other hyperlipidemia: Secondary | ICD-10-CM

## 2016-02-23 DIAGNOSIS — I10 Essential (primary) hypertension: Secondary | ICD-10-CM | POA: Diagnosis not present

## 2016-02-23 DIAGNOSIS — Z9889 Other specified postprocedural states: Secondary | ICD-10-CM

## 2016-02-23 NOTE — Patient Instructions (Signed)
Medication Instructions:  None  Labwork: None  Testing/Procedures: None  Follow-Up: Your physician wants you to follow-up in: 9-12 months with Dr. Tamala Julian. You will receive a reminder letter in the mail two months in advance. If you don't receive a letter, please call our office to schedule the follow-up appointment.   Any Other Special Instructions Will Be Listed Below (If Applicable).   Low-Sodium Eating Plan Sodium raises blood pressure and causes water to be held in the body. Getting less sodium from food will help lower your blood pressure, reduce any swelling, and protect your heart, liver, and kidneys. We get sodium by adding salt (sodium chloride) to food. Most of our sodium comes from canned, boxed, and frozen foods. Restaurant foods, fast foods, and pizza are also very high in sodium. Even if you take medicine to lower your blood pressure or to reduce fluid in your body, getting less sodium from your food is important. What is my plan? Most people should limit their sodium intake to 2,300 mg a day. Your health care provider recommends that you limit your sodium intake to __2g/2,000mg __ a day.  What do I need to know about this eating plan? For the low-sodium eating plan, you will follow these general guidelines:  Choose foods with a % Daily Value for sodium of less than 5% (as listed on the food label).  Use salt-free seasonings or herbs instead of table salt or sea salt.  Check with your health care provider or pharmacist before using salt substitutes.  Eat fresh foods.  Eat more vegetables and fruits.  Limit canned vegetables. If you do use them, rinse them well to decrease the sodium.  Limit cheese to 1 oz (28 g) per day.  Eat lower-sodium products, often labeled as "lower sodium" or "no salt added."  Avoid foods that contain monosodium glutamate (MSG). MSG is sometimes added to [place] food and some canned foods.  Check food labels (Nutrition Facts labels) on  foods to learn how much sodium is in one serving.  Eat more home-cooked food and less restaurant, buffet, and fast food.  When eating at a restaurant, ask that your food be prepared with less salt, or no salt if possible. How do I read food labels for sodium information? The Nutrition Facts label lists the amount of sodium in one serving of the food. If you eat more than one serving, you must multiply the listed amount of sodium by the number of servings. Food labels may also identify foods as:  Sodium free-Less than 5 mg in a serving.  Very low sodium-35 mg or less in a serving.  Low sodium-140 mg or less in a serving.  Light in sodium-50% less sodium in a serving. For example, if a food that usually has 300 mg of sodium is changed to become light in sodium, it will have 150 mg of sodium.  Reduced sodium-25% less sodium in a serving. For example, if a food that usually has 400 mg of sodium is changed to reduced sodium, it will have 300 mg of sodium. What foods can I eat? Grains  Low-sodium cereals, including oats, puffed wheat and rice, and shredded wheat cereals. Low-sodium crackers. Unsalted rice and pasta. Lower-sodium bread. Vegetables  Frozen or fresh vegetables. Low-sodium or reduced-sodium canned vegetables. Low-sodium or reduced-sodium tomato sauce and paste. Low-sodium or reduced-sodium tomato and vegetable juices. Fruits  Fresh, frozen, and canned fruit. Fruit juice. Meat and Other Protein Products  Low-sodium canned tuna and salmon. Fresh or frozen  meat, poultry, seafood, and fish. Lamb. Unsalted nuts. Dried beans, peas, and lentils without added salt. Unsalted canned beans. Homemade soups without salt. Eggs. Dairy  Milk. Soy milk. Ricotta cheese. Low-sodium or reduced-sodium cheeses. Yogurt. Condiments  Fresh and dried herbs and spices. Salt-free seasonings. Onion and garlic powders. Low-sodium varieties of mustard and ketchup. Fresh or refrigerated horseradish. Lemon  juice. Fats and Oils  Reduced-sodium salad dressings. Unsalted butter. Other  Unsalted popcorn and pretzels. The items listed above may not be a complete list of recommended foods or beverages. Contact your dietitian for more options.  What foods are not recommended? Grains  Instant hot cereals. Bread stuffing, pancake, and biscuit mixes. Croutons. Seasoned rice or pasta mixes. Noodle soup cups. Boxed or frozen macaroni and cheese. Self-rising flour. Regular salted crackers. Vegetables  Regular canned vegetables. Regular canned tomato sauce and paste. Regular tomato and vegetable juices. Frozen vegetables in sauces. Salted [place] fries. Olives. Angie Fava. Relishes. Sauerkraut. Salsa. Meat and Other Protein Products  Salted, canned, smoked, spiced, or pickled meats, seafood, or fish. Bacon, ham, sausage, hot dogs, corned beef, chipped beef, and packaged luncheon meats. Salt pork. Jerky. Pickled herring. Anchovies, regular canned tuna, and sardines. Salted nuts. Dairy  Processed cheese and cheese spreads. Cheese curds. Blue cheese and cottage cheese. Buttermilk. Condiments  Onion and garlic salt, seasoned salt, table salt, and sea salt. Canned and packaged gravies. Worcestershire sauce. Tartar sauce. Barbecue sauce. Teriyaki sauce. Soy sauce, including reduced sodium. Steak sauce. Fish sauce. Oyster sauce. Cocktail sauce. Horseradish that you find on the shelf. Regular ketchup and mustard. Meat flavorings and tenderizers. Bouillon cubes. Hot sauce. Tabasco sauce. Marinades. Taco seasonings. Relishes. Fats and Oils  Regular salad dressings. Salted butter. Margarine. Ghee. Bacon fat. Other  Potato and tortilla chips. Corn chips and puffs. Salted popcorn and pretzels. Canned or dried soups. Pizza. Frozen entrees and pot pies. The items listed above may not be a complete list of foods and beverages to avoid. Contact your dietitian for more information.  This information is not intended to replace  advice given to you by your health care provider. Make sure you discuss any questions you have with your health care provider. Document Released: 09/09/2001 Document Revised: 08/26/2015 Document Reviewed: 01/22/2013 Elsevier Interactive Patient Education  2017 [employer].    If you need a refill on your cardiac medications before your next appointment, please call your pharmacy.

## 2016-02-23 NOTE — Progress Notes (Signed)
Cardiology Office Note    Date:  02/23/2016   ID:  [name], DOB 11/08/1935, MRN [id number]  PCP:  Antony Blackbird, MD  Cardiologist: [name], MD   Chief Complaint  Patient presents with  . Congestive Heart Failure    History of Present Illness:  [name] is a 80 y.o. male who presents for with mechanical aortic valve, chronic atrial fibrillation, chronic combined systolic and diastolic heart failure, chronic Coumadin therapy, and prior CABG.  [name] admits to not using his diuretics on a regular basis for greater than 12 months. He has noted lower extremity swelling. He has noted that he is unable to exert to the same level. Therefore, this summer he was not able to maintain his yard as he previously did. He denies palpitations and syncope. No transient neurological complaints. No chest pain. Significant lower extremity swelling.   Past Medical History:  Diagnosis Date  . Aortic stenosis   . Carotid artery disease ([place]) 1994   s/p left carotid endarerectomy   . CHF with unknown LVEF (H[facility])    47 %  . Chronic atrial fibrillation ([place])   . Hx of CABG   . Hypertension   . Left ventricular dysfunction 2007   LVEF 47%   . OSA (obstructive sleep apnea)   . Rheumatic fever   . Subclavian bypass stenosis ([place])   . Vitamin B12 deficiency     Past Surgical History:  Procedure Laterality Date  . [place]   replaced due to aortic stenosis, St. Jude mechanical prostesis  . CAROTID ENDARTERECTOMY Left 1997   subclavian bypass Done in [place]  . CORONARY ARTERY BYPASS GRAFT  1994   w SVG to RCA and SVG to circumflex  . heart bypass     Done in [place]    Current Medications: Outpatient Medications Prior to Visit  Medication Sig Dispense Refill  . carvedilol (COREG) 25 MG tablet Take 1 tablet (25 mg total) by mouth 2 (two) times daily with a meal. 180 tablet 3  . cephALEXin (KEFLEX) 500 MG capsule Take 1 capsule (500 mg total) by mouth  4 (four) times daily. 28 capsule 0  . Cyanocobalamin (VITAMIN B12) 3000 MCG/ML LIQD Place 2,000 mcg under the tongue daily.    . enalapril (VASOTEC) 20 MG tablet TAKE 1 TABLET BY MOUTH EVERY DAY 90 tablet 0  . ezetimibe-simvastatin (VYTORIN) 10-20 MG per tablet Take 1 tablet by mouth at bedtime. 90 tablet 2  . fluocinonide cream (LIDEX) 7.03 % Apply 1 application topically 2 (two) times daily.     . folic acid (FOLVITE) 1 MG tablet Take 1 tablet (1 mg total) by mouth daily.    . furosemide (LASIX) 40 MG tablet Take 1 tablet (40 mg total) by mouth daily. 30 tablet 0  . glucose blood (ONE TOUCH ULTRA TEST) test strip USE AS DIRECTED TO TEST BLOOD GLUCOSE  EVERY OTHER DAY DX: 250.00 50 each 2  . metFORMIN (GLUCOPHAGE-XR) 750 MG 24 hr tablet TAKE 3 TABLETS BY MOUTH EVERY DAY 270 tablet 1  . NON FORMULARY BIPAP    . NON FORMULARY ONE TOUCH ULTRA TEST STRIPS  AND LANCETS    . ONETOUCH DELICA LANCETS FINE MISC Use to obtain a blood specimen every other day Dx code 250.00 100 each 1  . PROAIR HFA 108 (90 BASE) MCG/ACT inhaler Inhale 2 puffs into the lungs every 6 (six) hours as needed. For chest congestion    .  silver sulfADIAZINE (SILVADENE) 1 % cream Apply 1 application topically daily. 25 g 0  . warfarin (COUMADIN) 5 MG tablet TAKE AS DIRECTED BY COUMADIN CLINIC 45 tablet 3   No facility-administered medications prior to visit.      Allergies:   Rifampin and Vancomycin   Social History   Social History  . Marital status: Married    Spouse name: N/A  . Number of children: N/A  . Years of education: N/A   Social History Main Topics  . Smoking status: Former Smoker    Packs/day: 1.00    Years: 30.00    Types: Cigarettes    Quit date: 04/03/1992  . Smokeless tobacco: Never Used  . Alcohol use Yes     Comment: 4-6 ounces of wine daily  . Drug use: No  . Sexual activity: Not Asked   Other Topics Concern  . None   Social History Narrative  . None     Family History:  The patient's  family history includes Cancer in his father; Hypertension in his mother.   ROS:   Please see the history of present illness.    Snoring, shortness of breath, otherwise unremarkable.  All other systems reviewed and are negative.   PHYSICAL EXAM:   VS:  Ht 6\' 2"  (1.88 m)    GEN: Well nourished, well developed, in no acute distress  HEENT: normal  Neck: no JVD, carotid bruits, or masses Cardiac:Crisp aortic valve closure sounds are heard. IIRR; no murmurs, rubs, or gallops; there is 2-3+ ankle to shin edema.  Respiratory:  clear to auscultation bilaterally, normal work of breathing GI: soft, nontender, nondistended, + BS MS: no deformity or atrophy  Skin: warm and dry, no rash Neuro:  Alert and Oriented x 3, Strength and sensation are intact Psych: euthymic mood, full affect  Wt Readings from Last 3 Encounters:  02/18/16 268 lb (121.6 kg)  02/16/16 270 lb (122.5 kg)  08/16/15 264 lb 12.8 oz (120.1 kg)      Studies/Labs Reviewed:   EKG:  EKG  Atrial fibrillation with controlled ventricular rate is 64 bpm. Normal axis. Nonspecific T-wave abnormality.  Recent Labs: 07/22/2015: HGB 13.0; Platelets 67 02/11/2016: ALT 10; BUN 30; Creatinine, Ser 0.97; Potassium 4.3; Sodium 140   Lipid Panel    Component Value Date/Time   CHOL 109 08/11/2015 0851   TRIG 71.0 08/11/2015 0851   HDL 44.00 08/11/2015 0851   CHOLHDL 2 08/11/2015 0851   VLDL 14.2 08/11/2015 0851   LDLCALC 51 08/11/2015 0851    Additional studies/ records that were reviewed today include:  No new data    ASSESSMENT:    1. Permanent atrial fibrillation ([place])   2. History of mechanical aortic valve replacement   3. CHRONIC DIASTOLIC HEART FAILURE   4. Essential hypertension   5. Atherosclerosis of native coronary artery of native heart without angina pectoris   6. Long term current use of anticoagulant therapy   7. CAROTID ENDARTERECTOMY, LEFT, HX OF   8. Other hyperlipidemia      PLAN:  In order of  problems listed above:  1. Rate control is adequate. 2. Mechanical aortic valve auscultation is normal. No symptoms to suggest valve dysfunction. Last echo evaluation was performed in 2015 and demonstrated normal function. 3. Moderate volume overload in this patient with lower extremity edema. 4. Adequate control. Low salt diet as advocated. 5. Stable without complaints. 6. No bleeding complications.     Medication Adjustments/Labs and Tests Ordered: Current medicines  are reviewed at length with the patient today.  Concerns regarding medicines are outlined above.  Medication changes, Labs and Tests ordered today are listed in the Patient Instructions below. There are no Patient Instructions on file for this visit.   Signed, [name], MD  02/23/2016 8:20 AM    [facility] [address], [place], [place]  [address] Phone: [phone]; Fax: [phone]

## 2016-03-01 ENCOUNTER — Encounter: Payer: Self-pay | Admitting: Family

## 2016-03-02 ENCOUNTER — Ambulatory Visit: Payer: Medicare Other | Admitting: Cardiology

## 2016-03-03 ENCOUNTER — Ambulatory Visit (INDEPENDENT_AMBULATORY_CARE_PROVIDER_SITE_OTHER): Payer: Medicare Other | Admitting: Family

## 2016-03-03 ENCOUNTER — Ambulatory Visit (HOSPITAL_COMMUNITY)
Admission: RE | Admit: 2016-03-03 | Discharge: 2016-03-03 | Disposition: A | Discharge status: Home / Self Care | Payer: Medicare Other | Source: Ambulatory Visit | Attending: Vascular Surgery | Admitting: Vascular Surgery

## 2016-03-03 ENCOUNTER — Encounter: Payer: Self-pay | Admitting: Family

## 2016-03-03 VITALS — BP 99/68 | HR 63 | Temp 97.7°F | Resp 14 | Ht 74.0 in | Wt 258.0 lb

## 2016-03-03 DIAGNOSIS — I872 Venous insufficiency (chronic) (peripheral): Secondary | ICD-10-CM | POA: Diagnosis not present

## 2016-03-03 DIAGNOSIS — I70211 Atherosclerosis of native arteries of extremities with intermittent claudication, right leg: Secondary | ICD-10-CM | POA: Insufficient documentation

## 2016-03-03 DIAGNOSIS — I771 Stricture of artery: Secondary | ICD-10-CM

## 2016-03-03 NOTE — Patient Instructions (Addendum)
Intermittent Claudication Intermittent claudication is pain in your leg that occurs when you walk or exercise and goes away when you rest. The pain can occur in one or both legs. CAUSES Intermittent claudication is caused by the buildup of plaque within the major arteries in the body (atherosclerosis). The plaque, which makes arteries stiff and narrow, prevents enough blood from reaching your leg muscles. The pain occurs when you walk or exercise because your muscles need more blood when you are moving and exercising. RISK FACTORS Risk factors include:  A family history of atherosclerosis.  A personal history of stroke or heart disease.  Older age.  Being inactive or overweight.  Smoking cigarettes.  Having another health condition such as:  Diabetes.  High blood pressure.  High cholesterol. SIGNS AND SYMPTOMS  Your hip or leg may:   Ache.  Cramp.  Feel tight.  Feel weak.  Feel heavy. Over time, you may feel pain in your calf, thigh, or hip. DIAGNOSIS  Your health care provider may diagnose intermittent claudication based on your symptoms and medical history. Your health care provider may also do tests to learn more about your condition. These may include:  Blood tests.  An ultrasound.  Imaging tests such as angiography, magnetic resonance angiography (MRA), and computed tomography angiography (CTA). TREATMENT You may be treated for problems such as:  High blood pressure.  High cholesterol.  Diabetes. Other treatments may include:  Lifestyle changes such as:  Starting an exercise program.  Losing weight.  Quitting smoking.  Medicines to help restore blood flow through your legs.  Blood vessel surgery (angioplasty) to restore blood flow if your intermittent claudication is caused by severe peripheral artery disease. HOME CARE INSTRUCTIONS  Manage any other health conditions you have.  Eat a diet low in saturated fats and calories to maintain a  healthy weight.  Quit smoking, if you smoke.  Take medicines only as directed by your health care provider.  If your health care provider recommended an exercise program for you, follow it as directed. Your exercise program may involve:  Walking three or more times a week.  Walking until you have certain symptoms of intermittent claudication.  Resting until symptoms go away.  Gradually increasing walking time to about 50 minutes a day. SEEK MEDICAL CARE IF: Your condition is not getting better or is getting worse. SEEK IMMEDIATE MEDICAL CARE IF:   You have chest pain.  You have difficulty breathing.  You develop arm weakness.  You have trouble speaking.  Your face begins to droop. MAKE SURE YOU:  Understand these instructions.  Will watch your condition.  Will get help if you are not doing well or get worse. This information is not intended to replace advice given to you by your health care provider. Make sure you discuss any questions you have with your health care provider. Document Released: 01/21/2004 Document Revised: 04/10/2014 Document Reviewed: 06/26/2013 Elsevier Interactive Patient Education  2017 [place].     Venous Stasis or Chronic Venous Insufficiency Chronic venous insufficiency, also called venous stasis, is a condition that affects the veins in the legs. The condition prevents blood from being pumped through these veins effectively. Blood may no longer be pumped effectively from the legs back to the heart. This condition can range from mild to severe. With proper treatment, you should be able to continue with an active life. CAUSES  Chronic venous insufficiency occurs when the vein walls become stretched, weakened, or damaged or when valves within the  vein are damaged. Some common causes of this include:  High blood pressure inside the veins (venous hypertension).  Increased blood pressure in the leg veins from long periods of sitting or  standing.  A blood clot that blocks blood flow in a vein (deep vein thrombosis).  Inflammation of a superficial vein (phlebitis) that causes a blood clot to form. RISK FACTORS Various things can make you more likely to develop chronic venous insufficiency, including:  Family history of this condition.  Obesity.  Pregnancy.  Sedentary lifestyle.  Smoking.  Jobs requiring long periods of standing or sitting in one place.  Being a certain age. Women in their 30s and 45s and men in their 39s are more likely to develop this condition. SIGNS AND SYMPTOMS  Symptoms may include:   Varicose veins.  Skin breakdown or ulcers.  Reddened or discolored skin on the leg.  Brown, smooth, tight, and painful skin just above the ankle, usually on the inside surface (lipodermatosclerosis).  Swelling. DIAGNOSIS  To diagnose this condition, your health care provider will take a medical history and do a physical exam. The following tests may be ordered to confirm the diagnosis:  Duplex ultrasound-A procedure that produces a picture of a blood vessel and nearby organs and also provides information on blood flow through the blood vessel.  Plethysmography-A procedure that tests blood flow.  A venogram, or venography-A procedure used to look at the veins using X-ray and dye. TREATMENT The goals of treatment are to help you return to an active life and to minimize pain or disability. Treatment will depend on the severity of the condition. Medical procedures may be needed for severe cases. Treatment options may include:   Use of compression stockings. These can help with symptoms and lower the chances of the problem getting worse, but they do not cure the problem.  Sclerotherapy-A procedure involving an injection of a material that "dissolves" the damaged veins. Other veins in the network of blood vessels take over the function of the damaged veins.  Surgery to remove the vein or cut off blood flow  through the vein (vein stripping or laser ablation surgery).  Surgery to repair a valve. HOME CARE INSTRUCTIONS   Wear compression stockings as directed by your health care provider.  Only take over-the-counter or prescription medicines for pain, discomfort, or fever as directed by your health care provider.  Follow up with your health care provider as directed. SEEK MEDICAL CARE IF:   You have redness, swelling, or increasing pain in the affected area.  You see a red streak or line that extends up or down from the affected area.  You have a breakdown or loss of skin in the affected area, even if the breakdown is small.  You have an injury to the affected area. SEEK IMMEDIATE MEDICAL CARE IF:   You have an injury and open wound in the affected area.  Your pain is severe and does not improve with medicine.  You have sudden numbness or weakness in the foot or ankle below the affected area, or you have trouble moving your foot or ankle.  You have a fever or persistent symptoms for more than 2-3 days.  You have a fever and your symptoms suddenly get worse. MAKE SURE YOU:   Understand these instructions.  Will watch your condition.  Will get help right away if you are not doing well or get worse. This information is not intended to replace advice given to you by your health  care provider. Make sure you discuss any questions you have with your health care provider. Document Released: 07/24/2006 Document Revised: 01/08/2013 Document Reviewed: 11/25/2012 Elsevier Interactive Patient Education  2017 [employer]    To measure for knee high compression hose: Measure the length of calf, largest circumference of calf, ankle circumference first thing in the morning, take these 3 measurements with you to obtain 20-30 mm mercury graduated knee high compression hose.  Put on in the morning, remove at bedtime.

## 2016-03-03 NOTE — Progress Notes (Signed)
[facility] [place]   CC: Follow up peripheral artery occlusive disease  History of Present Illness [name] is a 80 y.o. male patient of Dr. Bridgett Larsson who initially presented with chief complaint: right thigh cramping. The patient's symptoms are completely stable.  The patient's symptoms were: right thigh cramping and fatigue extending to calf with > 1 block walking. The patient's treatment regimen currently includes: maximal medical management and walking plan.   Prior exercise ABI verifying low grade iliac disease on R side.  Currently his right buttock aches after walking about 3/4 block on an incline, relieved by rest; he denies any sx's in his left leg.   He reports a left subclavian artery bypass and left CEA in 1997 in [place]; he moved to [place] in 2005.   Pt denies any hx of stroke or TIA.  He states he had 2 MI's in early 1994, had 2 vessels CABG in 1994.  He has intentionally cut back his portions and lost weight, states he feels better.  He denies feeling light headed.   Pt Diabetic: Yes, last A1C was 5.4 on 02-11-16 (review of records) Pt smoker: former smoker, quit in 1994, smoked x 30 years  Pt meds include: Statin :Yes Betablocker: Yes ASA: No Other anticoagulants/antiplatelets: coumadin, has atrial fib and hx of aortic valve replacement  Past Medical History:  Diagnosis Date  . Aortic stenosis   . Carotid artery disease ([place]) 1994   s/p left carotid endarerectomy   . CHF with unknown LVEF ([facility])    47 %  . Chronic atrial fibrillation ([place])   . Hx of CABG   . Hypertension   . Left ventricular dysfunction 2007   LVEF 47%   . OSA (obstructive sleep apnea)   . Rheumatic fever   . Subclavian bypass stenosis ([place])   . Vitamin B12 deficiency     Social History Social History  Substance Use Topics  . Smoking status: Former Smoker    Packs/day: 1.00    Years: 30.00    Types: Cigarettes    Quit date: 04/03/1992  . Smokeless  tobacco: Never Used  . Alcohol use Yes     Comment: 4-6 ounces of wine daily    Family History Family History  Problem Relation Age of Onset  . Cancer Father     lung   . Hypertension Mother     Past Surgical History:  Procedure Laterality Date  . [place]   replaced due to aortic stenosis, St. Jude mechanical prostesis  . CAROTID ENDARTERECTOMY Left 1997   subclavian bypass Done in [place]  . CORONARY ARTERY BYPASS GRAFT  1994   w SVG to RCA and SVG to circumflex  . heart bypass     Done in [place]    Allergies  Allergen Reactions  . Rifampin     REACTION: rash (unclear if due to vanco or rifampin)  . Vancomycin     REACTION: rash (unclear if due to rifampin or vanco)    Current Outpatient Prescriptions  Medication Sig Dispense Refill  . carvedilol (COREG) 25 MG tablet Take 1 tablet (25 mg total) by mouth 2 (two) times daily with a meal. 180 tablet 3  . Cyanocobalamin (VITAMIN B12) 3000 MCG/ML LIQD Place 2,000 mcg under the tongue daily.    . enalapril (VASOTEC) 20 MG tablet TAKE 1 TABLET BY MOUTH EVERY DAY 90 tablet 0  . ezetimibe-simvastatin (VYTORIN) 10-20 MG per tablet Take 1 tablet by  mouth at bedtime. 90 tablet 2  . fluocinonide cream (LIDEX) 1.82 % Apply 1 application topically 2 (two) times daily.     . folic acid (FOLVITE) 1 MG tablet Take 1 tablet (1 mg total) by mouth daily.    . furosemide (LASIX) 40 MG tablet Take 1 tablet (40 mg total) by mouth daily. 30 tablet 0  . glucose blood (ONE TOUCH ULTRA TEST) test strip USE AS DIRECTED TO TEST BLOOD GLUCOSE  EVERY OTHER DAY DX: 250.00 50 each 2  . metFORMIN (GLUCOPHAGE-XR) 750 MG 24 hr tablet TAKE 3 TABLETS BY MOUTH EVERY DAY 270 tablet 1  . NON FORMULARY BIPAP    . NON FORMULARY ONE TOUCH ULTRA TEST STRIPS  AND LANCETS    . ONETOUCH DELICA LANCETS FINE MISC Use to obtain a blood specimen every other day Dx code 250.00 100 each 1  . PROAIR HFA 108 (90 BASE) MCG/ACT inhaler Inhale 2 puffs  into the lungs every 6 (six) hours as needed. For chest congestion    . silver sulfADIAZINE (SILVADENE) 1 % cream Apply 1 application topically daily. 25 g 0  . warfarin (COUMADIN) 5 MG tablet TAKE AS DIRECTED BY COUMADIN CLINIC 45 tablet 3  . cephALEXin (KEFLEX) 500 MG capsule Take 1 capsule (500 mg total) by mouth 4 (four) times daily. (Patient not taking: Reported on 03/03/2016) 28 capsule 0   No current facility-administered medications for this visit.     ROS: See HPI for pertinent positives and negatives.   Physical Examination  Vitals:   03/03/16 1103 03/03/16 1109  BP: (!) 92/57 99/68  Pulse: 63   Resp: 14   Temp: 97.7 F (36.5 C)   SpO2: 94%   Weight: 258 lb (117 kg)   Height: 6\' 2"  (1.88 m)    Body mass index is 33.13 kg/m.  General: A&O x 3, WDWN, obese male  Pulmonary: Sym exp, respirations are non labored, good air movt, CTAB, no rales, rhonchi, or wheezing  Cardiac: RRR, Nl S1, S2, no detected murmur.  Vascular: Vessel Right Left  Radial Palpable Palpable  Brachial Palpable Palpable  Carotid Palpable, without bruit Palpable, without bruit  Aorta Not palpable N/A  Femoral Palpable Palpable  Popliteal Not palpable Not palpable  PT Palpable Palpable  DP Palpable Palpable   Gastrointestinal: soft, NTND, no G/R, no HSM, no palpable masses, no CVAT B  Musculoskeletal: M/S 5/5 throughout , Extremities without ischemic changes. Lower legs with leathery skin changes and hemosiderin deposits secondary to chronic venous stasis; 1+ pitting edema and 2+ non pitting edema in both lower legs and feet.   Neurologic: Pain and light touch intact in extremities , Motor exam as listed above     Non-Invasive Vascular Imaging: DATE: 03/03/2016 ABI:  RIGHT: 1.22 (1.0, 02-19-15), Waveforms: triphasic, TBI: 0.70;   LEFT: 1.09 (0.98), Waveforms: triphasic, TBI: 0.73   ASSESSMENT/PLAN: [name] is a 80 y.o. male who presents with  mild/moderte right buttock pain after walking on an incline for 3/4 block, R iliac artery stenosis. His ABI's and TBI's are normal with all triphasic waveforms.  Chronic venous insufficiency: Knee high graduated compression hose, see pt information.   Exercise ABI demonstrates some R iliac disease likely mild to moderate.  At this point the patient feels his sx are NOT lifestyle limiting, so Dr. Bridgett Larsson would hold off on endovascular intervention.  If his sx increase, Dr. Bridgett Larsson documented at pt's 03-03-15 visit that he would consider: Aortogram, bilateral runoff, and possible right  iliac artery intervention.  Dr. Bridgett Larsson advised repeat the ABI in 12 months to monitor any progression in his right iliac disease.  I discussed in depth with the patient the nature of atherosclerosis, and emphasized the importance of maximal medical management including strict control of blood pressure, blood glucose, and lipid levels, antiplatelet agents, obtaining regular exercise, and cessation of smoking.   The patient is aware that without maximal medical management the underlying atherosclerotic disease process will progress, limiting the benefit of any interventions.  The patient is currently on a statin: Vytorin.  The patient is currently on an anti-platelet: ASA. Thank you for allowing [place] to participate in this patient's care.  The patient was given information about PAD including signs, symptoms, treatment, what symptoms should prompt the patient to seek immediate medical care, and risk reduction measures to take.  Clemon Chambers, RN, MSN, FNP-C [facility] Phone: [phone]  Clinic MD: Bridgett Larsson  03/03/16 11:10 AM

## 2016-03-10 ENCOUNTER — Encounter: Payer: Self-pay | Admitting: Internal Medicine

## 2016-03-10 ENCOUNTER — Ambulatory Visit (INDEPENDENT_AMBULATORY_CARE_PROVIDER_SITE_OTHER): Payer: Medicare Other | Admitting: Internal Medicine

## 2016-03-10 DIAGNOSIS — S81801D Unspecified open wound, right lower leg, subsequent encounter: Secondary | ICD-10-CM

## 2016-03-10 DIAGNOSIS — R609 Edema, unspecified: Secondary | ICD-10-CM

## 2016-03-10 DIAGNOSIS — I5032 Chronic diastolic (congestive) heart failure: Secondary | ICD-10-CM | POA: Diagnosis not present

## 2016-03-10 DIAGNOSIS — I70211 Atherosclerosis of native arteries of extremities with intermittent claudication, right leg: Secondary | ICD-10-CM

## 2016-03-10 NOTE — Progress Notes (Signed)
Pre visit review using our clinic review tool, if applicable. No additional management support is needed unless otherwise documented below in the visit note. 

## 2016-03-10 NOTE — Assessment & Plan Note (Signed)
Better on Lasix

## 2016-03-10 NOTE — Progress Notes (Signed)
Subjective:  Patient ID: [name], male    DOB: 01-08-1936  Age: 80 y.o. MRN: [id number]  CC: No chief complaint on file.   HPI [name] presents for leg swelling, cellulitis and suture removal  Outpatient Medications Prior to Visit  Medication Sig Dispense Refill  . carvedilol (COREG) 25 MG tablet Take 1 tablet (25 mg total) by mouth 2 (two) times daily with a meal. 180 tablet 3  . Cyanocobalamin (VITAMIN B12) 3000 MCG/ML LIQD Place 2,000 mcg under the tongue daily.    . enalapril (VASOTEC) 20 MG tablet TAKE 1 TABLET BY MOUTH EVERY DAY 90 tablet 0  . ezetimibe-simvastatin (VYTORIN) 10-20 MG per tablet Take 1 tablet by mouth at bedtime. 90 tablet 2  . fluocinonide cream (LIDEX) 3.81 % Apply 1 application topically 2 (two) times daily.     . folic acid (FOLVITE) 1 MG tablet Take 1 tablet (1 mg total) by mouth daily.    . furosemide (LASIX) 40 MG tablet Take 1 tablet (40 mg total) by mouth daily. 30 tablet 0  . glucose blood (ONE TOUCH ULTRA TEST) test strip USE AS DIRECTED TO TEST BLOOD GLUCOSE  EVERY OTHER DAY DX: 250.00 50 each 2  . metFORMIN (GLUCOPHAGE-XR) 750 MG 24 hr tablet TAKE 3 TABLETS BY MOUTH EVERY DAY 270 tablet 1  . NON FORMULARY BIPAP    . NON FORMULARY ONE TOUCH ULTRA TEST STRIPS  AND LANCETS    . ONETOUCH DELICA LANCETS FINE MISC Use to obtain a blood specimen every other day Dx code 250.00 100 each 1  . PROAIR HFA 108 (90 BASE) MCG/ACT inhaler Inhale 2 puffs into the lungs every 6 (six) hours as needed. For chest congestion    . silver sulfADIAZINE (SILVADENE) 1 % cream Apply 1 application topically daily. 25 g 0  . warfarin (COUMADIN) 5 MG tablet TAKE AS DIRECTED BY COUMADIN CLINIC 45 tablet 3  . cephALEXin (KEFLEX) 500 MG capsule Take 1 capsule (500 mg total) by mouth 4 (four) times daily. 28 capsule 0   No facility-administered medications prior to visit.     ROS Review of Systems  Constitutional: Negative for chills and fever.  Cardiovascular: Positive  for leg swelling.  Musculoskeletal: Negative for gait problem.  Skin: Negative for color change, rash and wound.  Neurological: Negative for weakness.    Objective:  BP 90/60   Pulse 70   Wt 254 lb (115.2 kg)   SpO2 94%   BMI 32.61 kg/m   BP Readings from Last 3 Encounters:  03/10/16 90/60  03/03/16 99/68  02/23/16 112/68    Wt Readings from Last 3 Encounters:  03/10/16 254 lb (115.2 kg)  03/03/16 258 lb (117 kg)  02/23/16 259 lb (117.5 kg)    Physical Exam  Constitutional: He is oriented to person, place, and time. He appears well-developed. No distress.  NAD  HENT:  Mouth/Throat: Oropharynx is clear and moist.  Eyes: Conjunctivae are normal. Pupils are equal, round, and reactive to light.  Neck: Normal range of motion. No JVD present. No thyromegaly present.  Cardiovascular: Normal rate, regular rhythm, normal heart sounds and intact distal pulses.  Exam reveals no gallop and no friction rub.   No murmur heard. Pulmonary/Chest: Effort normal and breath sounds normal. No respiratory distress. He has no wheezes. He has no rales. He exhibits no tenderness.  Abdominal: Soft. Bowel sounds are normal. He exhibits no distension and no mass. There is no tenderness. There is no rebound and  no guarding.  Musculoskeletal: Normal range of motion. He exhibits edema. He exhibits no tenderness.  Lymphadenopathy:    He has no cervical adenopathy.  Neurological: He is alert and oriented to person, place, and time. He has normal reflexes. No cranial nerve deficit. He exhibits normal muscle tone. He displays a negative Romberg sign. Coordination and gait normal.  Skin: Skin is warm and dry. No rash noted. No erythema.  Psychiatric: He has a normal mood and affect. His behavior is normal. Judgment and thought content normal.  edema - trace B No erythema Scab - better 3 sutures were removed   No results found.  Assessment & Plan:   There are no diagnoses linked to this encounter. I  have discontinued Mr. Mckenzie cephALEXin. I am also having him maintain his NON FORMULARY, fluocinonide cream, NON FORMULARY, ezetimibe-simvastatin, glucose blood, ONETOUCH DELICA LANCETS FINE, PROAIR HFA, Vitamin H21, folic acid, carvedilol, metFORMIN, warfarin, enalapril, silver sulfADIAZINE, and furosemide.  No orders of the defined types were placed in this encounter.    Follow-up: No Follow-up on file.  Walker Kehr, MD

## 2016-03-10 NOTE — Assessment & Plan Note (Signed)
Sutures (x3) removed

## 2016-03-16 ENCOUNTER — Other Ambulatory Visit: Payer: Self-pay | Admitting: Interventional Cardiology

## 2016-03-16 ENCOUNTER — Other Ambulatory Visit: Payer: Self-pay | Admitting: Internal Medicine

## 2016-03-16 ENCOUNTER — Other Ambulatory Visit: Payer: Self-pay | Admitting: Endocrinology

## 2016-03-20 ENCOUNTER — Encounter: Payer: Self-pay | Admitting: Cardiology

## 2016-03-20 ENCOUNTER — Ambulatory Visit (INDEPENDENT_AMBULATORY_CARE_PROVIDER_SITE_OTHER): Payer: Medicare Other | Admitting: Cardiology

## 2016-03-20 VITALS — BP 120/76 | HR 60 | Resp 18 | Wt 255.0 lb

## 2016-03-20 DIAGNOSIS — I70211 Atherosclerosis of native arteries of extremities with intermittent claudication, right leg: Secondary | ICD-10-CM

## 2016-03-20 DIAGNOSIS — E669 Obesity, unspecified: Secondary | ICD-10-CM | POA: Diagnosis not present

## 2016-03-20 DIAGNOSIS — G4733 Obstructive sleep apnea (adult) (pediatric): Secondary | ICD-10-CM | POA: Diagnosis not present

## 2016-03-20 DIAGNOSIS — I10 Essential (primary) hypertension: Secondary | ICD-10-CM

## 2016-03-20 NOTE — Progress Notes (Signed)
Cardiology Office Note    Date:  03/20/2016   ID:  [name], DOB 07/31/1935, MRN [id number]  PCP:  Antony Blackbird, MD  Cardiologist:  Fransico Him, MD   Chief Complaint  Patient presents with  . Follow-up  . Sleep Apnea  . Hypertension    History of Present Illness:  [name] is a 80 y.o. male with a history of OSA, obesity and HTN who presents today for followup. He is doing well. He tolerates his BiPAP without any problems. He feels rested in the am and has no daytime sleepiness. He sleeps well at night.  He tolerates the nasal mask without any problems but does snore some but his chin strap does not work well.  He says that the chin strap does not hold his mouth completely shut.  His weight is stable but he does not get much aerobic exercise but is trying to exercise more.   Past Medical History:  Diagnosis Date  . Aortic stenosis   . Carotid artery disease ([place]) 1994   s/p left carotid endarerectomy   . CHF with unknown LVEF ([facility])    47 %  . Chronic atrial fibrillation ([place])   . Hx of CABG   . Hypertension   . Left ventricular dysfunction 2007   LVEF 47%   . OSA (obstructive sleep apnea)   . Rheumatic fever   . Subclavian bypass stenosis ([place])   . Vitamin B12 deficiency     Past Surgical History:  Procedure Laterality Date  . [place]   replaced due to aortic stenosis, [employer] mechanical prostesis  . CAROTID ENDARTERECTOMY Left 1997   subclavian bypass Done in [place]  . CORONARY ARTERY BYPASS GRAFT  1994   w SVG to RCA and SVG to circumflex  . heart bypass     Done in [place]    Current Medications: Outpatient Medications Prior to Visit  Medication Sig Dispense Refill  . carvedilol (COREG) 25 MG tablet Take 1 tablet (25 mg total) by mouth 2 (two) times daily with a meal. 180 tablet 3  . Cyanocobalamin (VITAMIN B12) 3000 MCG/ML LIQD Place 2,000 mcg under the tongue daily.    . enalapril (VASOTEC) 20 MG tablet TAKE 1 TABLET BY  MOUTH EVERY DAY 90 tablet 0  . ezetimibe-simvastatin (VYTORIN) 10-20 MG per tablet Take 1 tablet by mouth at bedtime. 90 tablet 2  . fluocinonide cream (LIDEX) 6.21 % Apply 1 application topically 2 (two) times daily.     . folic acid (FOLVITE) 1 MG tablet Take 1 tablet (1 mg total) by mouth daily.    . furosemide (LASIX) 40 MG tablet Take 1 tablet (40 mg total) by mouth daily. 30 tablet 0  . furosemide (LASIX) 40 MG tablet TAKE 1 TABLET (40 MG TOTAL) BY MOUTH DAILY. 30 tablet 0  . glucose blood (ONE TOUCH ULTRA TEST) test strip USE AS DIRECTED TO TEST BLOOD GLUCOSE  EVERY OTHER DAY DX: 250.00 50 each 2  . metFORMIN (GLUCOPHAGE-XR) 750 MG 24 hr tablet TAKE 3 TABLETS BY MOUTH EVERY DAY 270 tablet 1  . NON FORMULARY BIPAP    . NON FORMULARY ONE TOUCH ULTRA TEST STRIPS  AND LANCETS    . ONETOUCH DELICA LANCETS FINE MISC Use to obtain a blood specimen every other day Dx code 250.00 100 each 1  . PROAIR HFA 108 (90 BASE) MCG/ACT inhaler Inhale 2 puffs into the lungs every 6 (six) hours as needed. For chest  congestion    . silver sulfADIAZINE (SILVADENE) 1 % cream Apply 1 application topically daily. 25 g 0  . warfarin (COUMADIN) 5 MG tablet TAKE AS DIRECTED BY COUMADIN CLINIC 45 tablet 3   No facility-administered medications prior to visit.      Allergies:   Rifampin and Vancomycin   Social History   Social History  . Marital status: Married    Spouse name: N/A  . Number of children: N/A  . Years of education: N/A   Social History Main Topics  . Smoking status: Former Smoker    Packs/day: 1.00    Years: 30.00    Types: Cigarettes    Quit date: 04/03/1992  . Smokeless tobacco: Never Used  . Alcohol use Yes     Comment: 4-6 ounces of wine daily  . Drug use: No  . Sexual activity: Not Asked   Other Topics Concern  . None   Social History Narrative  . None     Family History:  The patient's family history includes Cancer in his father; Hypertension in his mother.   ROS:     Please see the history of present illness.    ROS All other systems reviewed and are negative.  No flowsheet data found.     PHYSICAL EXAM:   VS:  BP 120/76   Pulse 60   Resp 18   Wt 255 lb (115.7 kg)   SpO2 97%   BMI 32.74 kg/m    GEN: Well nourished, well developed, in no acute distress  HEENT: normal  Neck: no JVD, carotid bruits, or masses Cardiac: irregularly irregular; no murmurs, rubs, or gallops. 1+ edema.  Intact distal pulses bilaterally.  Respiratory:  clear to auscultation bilaterally, normal work of breathing GI: soft, nontender, nondistended, + BS MS: no deformity or atrophy  Skin: warm and dry, no rash Neuro:  Alert and Oriented x 3, Strength and sensation are intact Psych: euthymic mood, full affect  Wt Readings from Last 3 Encounters:  03/20/16 255 lb (115.7 kg)  03/10/16 254 lb (115.2 kg)  03/03/16 258 lb (117 kg)      Studies/Labs Reviewed:   EKG:  EKG is not ordered today.   Recent Labs: 07/22/2015: HGB 13.0; Platelets 67 02/11/2016: ALT 10; BUN 30; Creatinine, Ser 0.97; Potassium 4.3; Sodium 140   Lipid Panel    Component Value Date/Time   CHOL 109 08/11/2015 0851   TRIG 71.0 08/11/2015 0851   HDL 44.00 08/11/2015 0851   CHOLHDL 2 08/11/2015 0851   VLDL 14.2 08/11/2015 0851   LDLCALC 51 08/11/2015 0851    Additional studies/ records that were reviewed today include:  none    ASSESSMENT:    1. OSA (obstructive sleep apnea)   2. Essential hypertension   3. Obesity (BMI 30-39.9)      PLAN:  In order of problems listed above:  OSA - the patient is tolerating PAP therapy well without any problems.  The patient has been using and benefiting from CPAP use and will continue to benefit from therapy. I will get a download from his DME.  I have encouraged him to take his chin strap with him to see if they can get a better one to hold his mouth closed. HTN - BP controlled on current meds. Continue carvedilol/ACE I Obesity - I have  encouraged him to get into a routine exercise program and cut back on carbs and portions.      Medication Adjustments/Labs and Tests Ordered: Current  medicines are reviewed at length with the patient today.  Concerns regarding medicines are outlined above.  Medication changes, Labs and Tests ordered today are listed in the Patient Instructions below.  There are no Patient Instructions on file for this visit.   Signed, Fransico Him, MD  03/20/2016 9:17 AM    [address], [place], [place]  [address] Phone: [phone]; Fax: [phone]

## 2016-03-20 NOTE — Patient Instructions (Signed)

## 2016-03-21 ENCOUNTER — Ambulatory Visit: Payer: Medicare Other | Admitting: Cardiology

## 2016-03-21 DIAGNOSIS — D696 Thrombocytopenia, unspecified: Secondary | ICD-10-CM | POA: Diagnosis not present

## 2016-03-21 DIAGNOSIS — Z952 Presence of prosthetic heart valve: Secondary | ICD-10-CM | POA: Diagnosis not present

## 2016-03-21 DIAGNOSIS — Z9889 Other specified postprocedural states: Secondary | ICD-10-CM | POA: Diagnosis not present

## 2016-03-21 DIAGNOSIS — G4733 Obstructive sleep apnea (adult) (pediatric): Secondary | ICD-10-CM | POA: Diagnosis not present

## 2016-03-21 DIAGNOSIS — E119 Type 2 diabetes mellitus without complications: Secondary | ICD-10-CM | POA: Diagnosis not present

## 2016-03-21 DIAGNOSIS — I10 Essential (primary) hypertension: Secondary | ICD-10-CM | POA: Diagnosis not present

## 2016-03-21 DIAGNOSIS — I482 Chronic atrial fibrillation: Secondary | ICD-10-CM | POA: Diagnosis not present

## 2016-03-21 DIAGNOSIS — Z7901 Long term (current) use of anticoagulants: Secondary | ICD-10-CM | POA: Diagnosis not present

## 2016-03-21 DIAGNOSIS — E785 Hyperlipidemia, unspecified: Secondary | ICD-10-CM | POA: Diagnosis not present

## 2016-03-21 DIAGNOSIS — I739 Peripheral vascular disease, unspecified: Secondary | ICD-10-CM | POA: Diagnosis not present

## 2016-03-21 DIAGNOSIS — I2510 Atherosclerotic heart disease of native coronary artery without angina pectoris: Secondary | ICD-10-CM | POA: Diagnosis not present

## 2016-03-21 DIAGNOSIS — Z7984 Long term (current) use of oral hypoglycemic drugs: Secondary | ICD-10-CM | POA: Diagnosis not present

## 2016-03-29 ENCOUNTER — Ambulatory Visit (INDEPENDENT_AMBULATORY_CARE_PROVIDER_SITE_OTHER): Payer: Medicare Other | Admitting: Pharmacist

## 2016-03-29 DIAGNOSIS — Z952 Presence of prosthetic heart valve: Secondary | ICD-10-CM | POA: Diagnosis not present

## 2016-03-29 DIAGNOSIS — Z5181 Encounter for therapeutic drug level monitoring: Secondary | ICD-10-CM

## 2016-03-29 DIAGNOSIS — I359 Nonrheumatic aortic valve disorder, unspecified: Secondary | ICD-10-CM

## 2016-03-29 DIAGNOSIS — I4821 Permanent atrial fibrillation: Secondary | ICD-10-CM

## 2016-03-29 DIAGNOSIS — I70211 Atherosclerosis of native arteries of extremities with intermittent claudication, right leg: Secondary | ICD-10-CM | POA: Diagnosis not present

## 2016-03-29 DIAGNOSIS — I482 Chronic atrial fibrillation: Secondary | ICD-10-CM

## 2016-03-29 LAB — POCT INR: INR: 2.5

## 2016-04-14 ENCOUNTER — Other Ambulatory Visit: Payer: Self-pay | Admitting: Internal Medicine

## 2016-05-10 ENCOUNTER — Ambulatory Visit (INDEPENDENT_AMBULATORY_CARE_PROVIDER_SITE_OTHER): Payer: Medicare Other | Admitting: *Deleted

## 2016-05-10 DIAGNOSIS — Z952 Presence of prosthetic heart valve: Secondary | ICD-10-CM

## 2016-05-10 DIAGNOSIS — I4821 Permanent atrial fibrillation: Secondary | ICD-10-CM

## 2016-05-10 DIAGNOSIS — I482 Chronic atrial fibrillation: Secondary | ICD-10-CM

## 2016-05-10 DIAGNOSIS — Z5181 Encounter for therapeutic drug level monitoring: Secondary | ICD-10-CM

## 2016-05-10 DIAGNOSIS — I359 Nonrheumatic aortic valve disorder, unspecified: Secondary | ICD-10-CM | POA: Diagnosis not present

## 2016-05-10 LAB — POCT INR: INR: 2.3

## 2016-05-24 ENCOUNTER — Ambulatory Visit (INDEPENDENT_AMBULATORY_CARE_PROVIDER_SITE_OTHER): Payer: Medicare Other | Admitting: *Deleted

## 2016-05-24 DIAGNOSIS — I359 Nonrheumatic aortic valve disorder, unspecified: Secondary | ICD-10-CM

## 2016-05-24 DIAGNOSIS — Z952 Presence of prosthetic heart valve: Secondary | ICD-10-CM | POA: Diagnosis not present

## 2016-05-24 DIAGNOSIS — Z5181 Encounter for therapeutic drug level monitoring: Secondary | ICD-10-CM | POA: Diagnosis not present

## 2016-05-24 LAB — POCT INR: INR: 2.8

## 2016-05-29 ENCOUNTER — Other Ambulatory Visit: Payer: Self-pay | Admitting: Interventional Cardiology

## 2016-06-12 ENCOUNTER — Other Ambulatory Visit: Payer: Self-pay | Admitting: Interventional Cardiology

## 2016-06-17 ENCOUNTER — Other Ambulatory Visit: Payer: Self-pay | Admitting: Internal Medicine

## 2016-06-21 ENCOUNTER — Ambulatory Visit (INDEPENDENT_AMBULATORY_CARE_PROVIDER_SITE_OTHER): Payer: Medicare Other | Admitting: *Deleted

## 2016-06-21 DIAGNOSIS — Z5181 Encounter for therapeutic drug level monitoring: Secondary | ICD-10-CM

## 2016-06-21 DIAGNOSIS — I359 Nonrheumatic aortic valve disorder, unspecified: Secondary | ICD-10-CM

## 2016-06-21 DIAGNOSIS — Z952 Presence of prosthetic heart valve: Secondary | ICD-10-CM

## 2016-06-21 LAB — POCT INR: INR: 2.5

## 2016-07-16 ENCOUNTER — Other Ambulatory Visit: Payer: Self-pay | Admitting: Internal Medicine

## 2016-07-17 NOTE — Telephone Encounter (Signed)
Please advise as you are not PCP

## 2016-07-19 ENCOUNTER — Ambulatory Visit (INDEPENDENT_AMBULATORY_CARE_PROVIDER_SITE_OTHER): Payer: Medicare Other | Admitting: Pharmacist

## 2016-07-19 DIAGNOSIS — I359 Nonrheumatic aortic valve disorder, unspecified: Secondary | ICD-10-CM | POA: Diagnosis not present

## 2016-07-19 DIAGNOSIS — Z5181 Encounter for therapeutic drug level monitoring: Secondary | ICD-10-CM

## 2016-07-19 DIAGNOSIS — Z952 Presence of prosthetic heart valve: Secondary | ICD-10-CM | POA: Diagnosis not present

## 2016-07-19 LAB — POCT INR: INR: 3

## 2016-07-20 ENCOUNTER — Ambulatory Visit (HOSPITAL_BASED_OUTPATIENT_CLINIC_OR_DEPARTMENT_OTHER): Payer: Medicare Other | Admitting: Hematology

## 2016-07-20 ENCOUNTER — Other Ambulatory Visit (HOSPITAL_BASED_OUTPATIENT_CLINIC_OR_DEPARTMENT_OTHER): Payer: Medicare Other

## 2016-07-20 ENCOUNTER — Encounter: Payer: Self-pay | Admitting: Hematology

## 2016-07-20 ENCOUNTER — Other Ambulatory Visit: Payer: Self-pay | Admitting: *Deleted

## 2016-07-20 ENCOUNTER — Telehealth: Payer: Self-pay | Admitting: Hematology

## 2016-07-20 VITALS — BP 105/46 | HR 58 | Temp 98.4°F | Resp 18 | Wt 263.4 lb

## 2016-07-20 DIAGNOSIS — D7589 Other specified diseases of blood and blood-forming organs: Secondary | ICD-10-CM

## 2016-07-20 DIAGNOSIS — D696 Thrombocytopenia, unspecified: Secondary | ICD-10-CM

## 2016-07-20 LAB — COMPREHENSIVE METABOLIC PANEL
ALT: 11 U/L (ref 0–55)
ANION GAP: 10 meq/L (ref 3–11)
AST: 18 U/L (ref 5–34)
Albumin: 3.8 g/dL (ref 3.5–5.0)
Alkaline Phosphatase: 112 U/L (ref 40–150)
BILIRUBIN TOTAL: 1.07 mg/dL (ref 0.20–1.20)
BUN: 32.9 mg/dL — ABNORMAL HIGH (ref 7.0–26.0)
CO2: 23 mEq/L (ref 22–29)
Calcium: 9.5 mg/dL (ref 8.4–10.4)
Chloride: 109 mEq/L (ref 98–109)
Creatinine: 1.3 mg/dL (ref 0.7–1.3)
EGFR: 53 mL/min/{1.73_m2} — AB (ref >90)
Glucose: 110 mg/dl (ref 70–140)
POTASSIUM: 5.1 meq/L (ref 3.5–5.1)
Sodium: 142 mEq/L (ref 136–145)
TOTAL PROTEIN: 7.2 g/dL (ref 6.4–8.3)

## 2016-07-20 LAB — CBC & DIFF AND RETIC
BASO%: 0.6 % (ref 0.0–2.0)
Basophils Absolute: 0 10*3/uL (ref 0.0–0.1)
EOS%: 3.1 % (ref 0.0–7.0)
Eosinophils Absolute: 0.1 10*3/uL (ref 0.0–0.5)
HEMATOCRIT: 38 % — AB (ref 38.4–49.9)
HGB: 12 g/dL — ABNORMAL LOW (ref 13.0–17.1)
Immature Retic Fract: 14.2 % — ABNORMAL HIGH (ref 3.00–10.60)
LYMPH#: 0.7 10*3/uL — AB (ref 0.9–3.3)
LYMPH%: 21.5 % (ref 14.0–49.0)
MCH: 35.6 pg — ABNORMAL HIGH (ref 27.2–33.4)
MCHC: 31.6 g/dL — ABNORMAL LOW (ref 32.0–36.0)
MCV: 112.8 fL — AB (ref 79.3–98.0)
MONO#: 0.2 10*3/uL (ref 0.1–0.9)
MONO%: 7.1 % (ref 0.0–14.0)
NEUT%: 67.7 % (ref 39.0–75.0)
NEUTROS ABS: 2.2 10*3/uL (ref 1.5–6.5)
PLATELETS: 64 10*3/uL — AB (ref 140–400)
RBC: 3.37 10*6/uL — ABNORMAL LOW (ref 4.20–5.82)
RDW: 15.3 % — AB (ref 11.0–14.6)
RETIC %: 2.28 % — AB (ref 0.80–1.80)
Retic Ct Abs: 76.84 10*3/uL (ref 34.80–93.90)
WBC: 3.3 10*3/uL — ABNORMAL LOW (ref 4.0–10.3)

## 2016-07-20 NOTE — Progress Notes (Signed)
[facility]   Hematology Oncology Clinic Note  Date of service: .07/20/2016    Patient Care Team: Aura Dials, MD as PCP - General (Family Medicine) Brunetta Genera, MD as Consulting Physician (Hematology) Belva Crome, MD as Consulting Physician (Cardiology)  CHIEF COMPLAINTS: follow-up for Macrocytosis and Thrombocytopenia.  HISTORY OF PRESENTING ILLNESS: Please see my initial note for details on initial presentation.  Interval history  [name] is here for scheduled follow-up regarding his thrombocytopenia.  He notes no significant medical issues over the last year and no significant medication changes. He notes that his primary care physician Dr. Chapman Fitch has left the practice and he is in the process of finding a new PCP.  He has had no issues with bleeding. Coumadin levels stable.  No other acute new symptoms . Platelet counts remain stable at 64k. Reports no significant medication changes.  MEDICAL HISTORY:  Past Medical History:  Diagnosis Date  . Aortic stenosis   . Carotid artery disease ([place]) 1994   s/p left carotid endarerectomy   . CHF with unknown LVEF ([facility])    47 %  . Chronic atrial fibrillation ([place])   . Hx of CABG   . Hypertension   . Left ventricular dysfunction 2007   LVEF 47%   . OSA (obstructive sleep apnea)   . Rheumatic fever   . Subclavian bypass stenosis ([place])   . Vitamin B12 deficiency     SURGICAL HISTORY: Past Surgical History:  Procedure Laterality Date  . [place]   replaced due to aortic stenosis, [employer] Jude mechanical prostesis  . CAROTID ENDARTERECTOMY Left 1997   subclavian bypass Done in [place]  . CORONARY ARTERY BYPASS GRAFT  1994   w SVG to RCA and SVG to circumflex  . heart bypass     Done in [place]    SOCIAL HISTORY: Social History   Social History  . Marital status: Married    Spouse name: N/A  . Number of children: N/A  . Years of education: N/A   Occupational History   . Not on file.   Social History Main Topics  . Smoking status: Former Smoker    Packs/day: 1.00    Years: 30.00    Types: Cigarettes    Quit date: 04/03/1992  . Smokeless tobacco: Never Used  . Alcohol use Yes     Comment: 4-6 ounces of wine daily  . Drug use: No  . Sexual activity: Not on file   Other Topics Concern  . Not on file   Social History Narrative  . No narrative on file    FAMILY HISTORY: Family History  Problem Relation Age of Onset  . Cancer Father     lung   . Hypertension Mother     ALLERGIES:  is allergic to rifampin and vancomycin.  MEDICATIONS:  Current Outpatient Prescriptions  Medication Sig Dispense Refill  . carvedilol (COREG) 25 MG tablet Take 1 tablet (25 mg total) by mouth 2 (two) times daily with a meal. 180 tablet 3  . Cyanocobalamin (VITAMIN B12) 3000 MCG/ML LIQD Place 2,000 mcg under the tongue daily.    . enalapril (VASOTEC) 20 MG tablet TAKE 1 TABLET BY MOUTH EVERY DAY 90 tablet 2  . ezetimibe-simvastatin (VYTORIN) 10-20 MG per tablet Take 1 tablet by mouth at bedtime. 90 tablet 2  . fluocinonide cream (LIDEX) 6.22 % Apply 1 application topically 2 (two) times daily.     . folic acid (FOLVITE) 1  MG tablet Take 1 tablet (1 mg total) by mouth daily.    . furosemide (LASIX) 40 MG tablet Take 1 tablet (40 mg total) by mouth daily. 30 tablet 0  . glucose blood (ONE TOUCH ULTRA TEST) test strip USE AS DIRECTED TO TEST BLOOD GLUCOSE  EVERY OTHER DAY DX: 250.00 50 each 2  . metFORMIN (GLUCOPHAGE-XR) 750 MG 24 hr tablet TAKE 3 TABLETS BY MOUTH EVERY DAY 270 tablet 1  . NON FORMULARY BIPAP    . NON FORMULARY ONE TOUCH ULTRA TEST STRIPS  AND LANCETS    . ONETOUCH DELICA LANCETS FINE MISC Use to obtain a blood specimen every other day Dx code 250.00 100 each 1  . PROAIR HFA 108 (90 BASE) MCG/ACT inhaler Inhale 2 puffs into the lungs every 6 (six) hours as needed. For chest congestion    . silver sulfADIAZINE (SILVADENE) 1 % cream Apply 1 application  topically daily. 25 g 0  . warfarin (COUMADIN) 5 MG tablet TAKE AS DIRECTED BY COUMADIN CLINIC 45 tablet 3   No current facility-administered medications for this visit.     REVIEW OF SYSTEMS:    10 point review of system is negative other than that mentioned above.  PHYSICAL EXAMINATION: ECOG PERFORMANCE STATUS: 1 - Symptomatic but completely ambulatory  Vitals:   07/20/16 0901  BP: (!) 105/46  Pulse: (!) 58  Resp: 18  Temp: 98.4 F (36.9 C)   Filed Weights   07/20/16 0901  Weight: 263 lb 7 oz (119.5 kg)    GENERAL:alert, no distress and comfortable SKIN: skin color, texture, turgor are normal, no rashes no petechiae. EYES: normal, conjunctiva are pink and non-injected, sclera clear OROPHARYNX:no exudate, no erythema and lips, buccal mucosa, and tongue normal  NECK: supple, thyroid normal size, non-tender, without nodularity LYMPH:  no palpable lymphadenopathy in the cervical, axillary or inguinal LUNGS: clear to auscultation and percussion with normal breathing effort HEART: regular rate & rhythm and no murmurs and no lower extremity edema.clear click of his metallic aortic valve noted.  ABDOMEN:abdomen soft, non-tender and normal bowel sounds Musculoskeletal:no cyanosis of digits and no clubbing  PSYCH: alert & oriented x 3 with fluent speech NEURO: no focal motor/sensory deficits  LABORATORY DATA:   . CBC Latest Ref Rng & Units 07/20/2016 07/22/2015 03/23/2015  WBC 4.0 - 10.3 10e3/uL 3.3(L) 3.3(L) 3.5(L)  Hemoglobin 13.0 - 17.1 g/dL 12.0(L) 13.0 13.4  Hematocrit 38.4 - 49.9 % 38.0(L) 41.0 41.9  Platelets 140 - 400 10e3/uL 64(L) 67(L) 71(L)    . CMP Latest Ref Rng & Units 07/20/2016 02/11/2016 08/11/2015  Glucose 70 - 140 mg/dl 110 105(H) 109(H)  BUN 7.0 - 26.0 mg/dL 32.9(H) 30(H) 24(H)  Creatinine 0.7 - 1.3 mg/dL 1.3 0.97 1.04  Sodium 136 - 145 mEq/L 142 140 139  Potassium 3.5 - 5.1 mEq/L 5.1 4.3 5.1  Chloride 96 - 112 mEq/L - 108 104  CO2 22 - 29 mEq/L 23 24  26   Calcium 8.4 - 10.4 mg/dL 9.5 9.3 9.5  Total Protein 6.4 - 8.3 g/dL 7.2 7.2 7.3  Total Bilirubin 0.20 - 1.20 mg/dL 1.07 1.5(H) 1.2  Alkaline Phos 40 - 150 U/L 112 130(H) 120(H)  AST 5 - 34 U/L 18 18 21   ALT 0 - 55 U/L 11 10 10     RADIOGRAPHIC STUDIES: I have personally reviewed the radiological images as listed and agreed with the findings in the report. No results found.  ASSESSMENT & PLAN   1) Thrombocytopenia. Moderate. 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 + cannot rule out ITP element. Could have possible element of MDS. He has some pelgeroid neutrophils on his peripheral blood smear that are suggestive of possible MDS. Hepatitis B, and C serologies negative. No evidence of a lymphoproliferative syndrome on peripheral blood smear or clinical examination. SPEP showed no monoclonal protein.  Plan - patient's platelets remain stable. -If platelet counts drop below 50,000 might need to limit the INR fluctuations on Coumadin from 2-3 more  strictly. -continue absolute alcohol cessation -Avoid NSAIDS and other medications known to cause thrombocytopenia especially ranitidine and other  H2 blockers, sulfa drugs, PPIs. -continue vitamin B12 2000 g sublingually daily and Folic acid 1 mg by mouth daily to support accelerated hematopoeisis with valve hemolysis. -if worsening cytopenias might consider a bone marrow biopsy. -Given transitioned between primary care physician's - we will have him repeat his CBC with differential without clinic in 3 months and see him back in 6 months. 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 Has somewhat elevated LDH and low haptoglobin confirms the presence of  low-grade hemolysis. Patient has no clinical evidence of hemoglobinuria. Peripheral blood smear showed minimal rare schistocytes. No micro-spherocytes to suggest autoimmune hemolysis.Coombs negative. 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 PLAN -aggressive B12 replacement - folic acid 1-2 mg po daily to maintain erythropoiesis in the setting of increased demand  from chronic hemolysis. -Bone marrow examination if worsening anemia or other cytopenia.  Labs in 3 months RTC with Dr Irene Limbo in 6 months with labs  Total time spent 25 minutes more than 50% of the time in direct patient care counseling and coordination of care.  Sullivan Lone MD [facility]ematology/Oncology Physician [facility]  (Office):       [phone] (Work cell):  [phone] (Fax):           [phone]

## 2016-07-20 NOTE — Telephone Encounter (Signed)
Appointments scheduled per 07/20/16 los. Patient was given a copy of the AVS report and appointment schedule per 07/20/16 los.

## 2016-08-11 ENCOUNTER — Other Ambulatory Visit: Payer: Medicare Other

## 2016-08-15 ENCOUNTER — Ambulatory Visit: Payer: Medicare Other | Admitting: Endocrinology

## 2016-08-21 ENCOUNTER — Other Ambulatory Visit (INDEPENDENT_AMBULATORY_CARE_PROVIDER_SITE_OTHER): Payer: Medicare Other

## 2016-08-21 DIAGNOSIS — E119 Type 2 diabetes mellitus without complications: Secondary | ICD-10-CM | POA: Diagnosis not present

## 2016-08-21 LAB — HEMOGLOBIN A1C: Hgb A1c MFr Bld: 5.7 % (ref 4.6–6.5)

## 2016-08-21 LAB — BASIC METABOLIC PANEL
BUN: 33 mg/dL — AB (ref 6–23)
CALCIUM: 9.4 mg/dL (ref 8.4–10.5)
CO2: 23 mEq/L (ref 19–32)
CREATININE: 1.11 mg/dL (ref 0.40–1.50)
Chloride: 109 mEq/L (ref 96–112)
GFR: 67.86 mL/min (ref >60.00)
GLUCOSE: 102 mg/dL — AB (ref 70–99)
POTASSIUM: 4.8 meq/L (ref 3.5–5.1)
Sodium: 139 mEq/L (ref 135–145)

## 2016-08-21 LAB — LIPID PANEL
Cholesterol: 116 mg/dL (ref 0–200)
HDL: 48.1 mg/dL (ref >39.00)
LDL Cholesterol: 57 mg/dL (ref 0–99)
NONHDL: 68.2
Total CHOL/HDL Ratio: 2
Triglycerides: 55 mg/dL (ref 0.0–149.0)
VLDL: 11 mg/dL (ref 0.0–40.0)

## 2016-08-23 ENCOUNTER — Ambulatory Visit (INDEPENDENT_AMBULATORY_CARE_PROVIDER_SITE_OTHER): Payer: Medicare Other | Admitting: *Deleted

## 2016-08-23 ENCOUNTER — Ambulatory Visit (INDEPENDENT_AMBULATORY_CARE_PROVIDER_SITE_OTHER): Payer: Medicare Other | Admitting: Endocrinology

## 2016-08-23 ENCOUNTER — Encounter: Payer: Self-pay | Admitting: Endocrinology

## 2016-08-23 VITALS — BP 106/54 | HR 67 | Ht 74.0 in | Wt 259.0 lb

## 2016-08-23 DIAGNOSIS — Z952 Presence of prosthetic heart valve: Secondary | ICD-10-CM

## 2016-08-23 DIAGNOSIS — Z5181 Encounter for therapeutic drug level monitoring: Secondary | ICD-10-CM

## 2016-08-23 DIAGNOSIS — E119 Type 2 diabetes mellitus without complications: Secondary | ICD-10-CM | POA: Diagnosis not present

## 2016-08-23 DIAGNOSIS — I359 Nonrheumatic aortic valve disorder, unspecified: Secondary | ICD-10-CM

## 2016-08-23 LAB — POCT INR: INR: 2.7

## 2016-08-23 MED ORDER — GLUCOSE BLOOD VI STRP: ORAL_STRIP | 2 refills | Status: DC

## 2016-08-23 NOTE — Progress Notes (Signed)
Patient ID: [name], male   DOB: 1935/12/12, 81 y.o.   MRN: [id number]   Reason for Appointment: Diabetes follow-up   History of Present Illness   Diagnosis: Type 2 DIABETES MELITUS date of onset: 07/2010       Oral hypoglycemic drugs: metformin 2250 mg daily       Side effects from medications: None  He was started on metformin when his baseline A1c was 6.9% and he had significant obesity He was also sent for diabetes education. Since then A1c had been consistently upper normal  Recent history:  Oral hypoglycemic drugs: Metformin ER 750 mg, 3 daily  His A1c again is excellent at 5.7, previously ranging from 5.4 up to 5.8%  He has difficulty losing weight which appears to be fluctuating However he thinks he has lost weight compared to last year and is trying to be a little more active He thinks he is fairly consistent with diet Although he has been on 2.25 g of metformin for the last few years on questioning he says that about once or twice a month he will have a loose stools or a little cramping in his abdomen His blood sugars appear to be lower although he is not using the Verio meter He did not have any low sugar symptoms when blood sugar was 61 after supper   Monitors blood glucose:  every other day  Glucose RANGE 61-109 with MEDIAN 93, does some readings at various times Lab glucose 102 on fasting  Meals: breakfast:occasional cereal otherwise muffin and peanut butter  Physical activity: exercise: walking more recently             Dietician visit: Most recent:2012          Last eye exam: annual    Wt Readings from Last 3 Encounters:  08/23/16 259 lb (117.5 kg)  07/20/16 263 lb 7 oz (119.5 kg)  03/20/16 255 lb (115.7 kg)   Lab Results  Component Value Date   HGBA1C 5.7 08/21/2016   HGBA1C 5.4 02/11/2016   HGBA1C 5.8 08/11/2015   Lab Results  Component Value Date   MICROALBUR 24.8 (H) 02/11/2016   LDLCALC 57 08/21/2016   CREATININE 1.11  08/21/2016    LABS:  Anti-coag visit on 08/23/2016  Component Date Value Ref Range Status  . INR 08/23/2016 2.7   Final  Lab on 08/21/2016  Component Date Value Ref Range Status  . Hgb A1c MFr Bld 08/21/2016 5.7  4.6 - 6.5 % Final   Glycemic Control Guidelines for People with Diabetes:Non Diabetic:  <6%Goal of Therapy: <7%Additional Action Suggested:  >8%   . Sodium 08/21/2016 139  135 - 145 mEq/L Final  . Potassium 08/21/2016 4.8  3.5 - 5.1 mEq/L Final  . Chloride 08/21/2016 109  96 - 112 mEq/L Final  . CO2 08/21/2016 23  19 - 32 mEq/L Final  . Glucose, Bld 08/21/2016 102* 70 - 99 mg/dL Final  . BUN 08/21/2016 33* 6 - 23 mg/dL Final  . Creatinine, Ser 08/21/2016 1.11  0.40 - 1.50 mg/dL Final  . Calcium 08/21/2016 9.4  8.4 - 10.5 mg/dL Final  . GFR 08/21/2016 67.86  >60.00 mL/min Final  . Cholesterol 08/21/2016 116  0 - 200 mg/dL Final   ATP III Classification       Desirable:  < 200 mg/dL               Borderline High:  200 - 239 mg/dL  High:  > = 240 mg/dL  . Triglycerides 08/21/2016 55.0  0.0 - 149.0 mg/dL Final   Normal:  <150 mg/dLBorderline High:  150 - 199 mg/dL  . HDL 08/21/2016 48.10  >39.00 mg/dL Final  . VLDL 08/21/2016 11.0  0.0 - 40.0 mg/dL Final  . LDL Cholesterol 08/21/2016 57  0 - 99 mg/dL Final  . Total CHOL/HDL Ratio 08/21/2016 2   Final                  Men          Women1/2 Average Risk     3.4          3.3Average Risk          5.0          4.42X Average Risk          9.6          7.13X Average Risk          15.0          11.0                      . NonHDL 08/21/2016 68.20   Final   NOTE:  Non-HDL goal should be 30 mg/dL higher than patient's LDL goal (i.e. LDL goal of < 70 mg/dL, would have non-HDL goal of < 100 mg/dL)    Allergies as of 08/23/2016      Reactions   Rifampin    REACTION: rash (unclear if due to vanco or rifampin)   Vancomycin    REACTION: rash (unclear if due to rifampin or vanco)      Medication List       Accurate as of  08/23/16  3:29 PM. Always use your most recent med list.          carvedilol 25 MG tablet Commonly known as:  COREG Take 1 tablet (25 mg total) by mouth 2 (two) times daily with a meal.   enalapril 20 MG tablet Commonly known as:  VASOTEC TAKE 1 TABLET BY MOUTH EVERY DAY   ezetimibe-simvastatin 10-20 MG tablet Commonly known as:  VYTORIN Take 1 tablet by mouth at bedtime.   fluocinonide cream 0.05 % Commonly known as:  LIDEX Apply 1 application topically 2 (two) times daily.   folic acid 1 MG tablet Commonly known as:  FOLVITE Take 1 tablet (1 mg total) by mouth daily.   furosemide 40 MG tablet Commonly known as:  LASIX Take 1 tablet (40 mg total) by mouth daily.   glucose blood test strip Commonly known as:  ONE TOUCH ULTRA TEST USE AS DIRECTED TO TEST BLOOD GLUCOSE  EVERY OTHER DAY DX: 250.00   metFORMIN 750 MG 24 hr tablet Commonly known as:  GLUCOPHAGE-XR TAKE 3 TABLETS BY MOUTH EVERY DAY   NON FORMULARY BIPAP   NON FORMULARY ONE TOUCH ULTRA TEST STRIPS  AND LANCETS   ONETOUCH DELICA LANCETS FINE Misc Use to obtain a blood specimen every other day Dx code 250.00   PROAIR HFA 108 (90 Base) MCG/ACT inhaler Generic drug:  albuterol Inhale 2 puffs into the lungs every 6 (six) hours as needed. For chest congestion   silver sulfADIAZINE 1 % cream Commonly known as:  SILVADENE Apply 1 application topically daily.   Vitamin B12 3000 MCG/ML Liqd Place 2,000 mcg under the tongue daily.   warfarin 5 MG tablet Commonly known as:  COUMADIN TAKE AS DIRECTED BY COUMADIN CLINIC  Allergies:  Allergies  Allergen Reactions  . Rifampin     REACTION: rash (unclear if due to vanco or rifampin)  . Vancomycin     REACTION: rash (unclear if due to rifampin or vanco)    Past Medical History:  Diagnosis Date  . Aortic stenosis   . Carotid artery disease ([place]) 1994   s/p left carotid endarerectomy   . CHF with unknown LVEF ([facility])    47 %  . Chronic atrial  fibrillation ([place])   . Hx of CABG   . Hypertension   . Left ventricular dysfunction 2007   LVEF 47%   . OSA (obstructive sleep apnea)   . Rheumatic fever   . Subclavian bypass stenosis ([place])   . Vitamin B12 deficiency     Past Surgical History:  Procedure Laterality Date  . [place]   replaced due to aortic stenosis, St. Jude mechanical prostesis  . CAROTID ENDARTERECTOMY Left 1997   subclavian bypass Done in [place]  . CORONARY ARTERY BYPASS GRAFT  1994   w SVG to RCA and SVG to circumflex  . heart bypass     Done in [place]    Family History  Problem Relation Age of Onset  . Cancer Father        lung   . Hypertension Mother     Social History:  reports that he quit smoking about 24 years ago. His smoking use included Cigarettes. He has a 30.00 pack-year smoking history. He has never used smokeless tobacco. He reports that he drinks alcohol. He reports that he does not use drugs.  Review of Systems    HYPERTENSION:  he is Enalapril, prescribed by his cardiologist No lightheadedness  BP Readings from Last 3 Encounters:  08/23/16 (!) 106/54  07/20/16 (!) 105/46  03/20/16 120/76     Lab Results  Component Value Date   CREATININE 1.11 08/21/2016   BUN 33 (H) 08/21/2016   NA 139 08/21/2016   K 4.8 08/21/2016   CL 109 08/21/2016   CO2 23 08/21/2016     HYPERLIPIDEMIA:         The lipid abnormality consists of elevated LDL treated with Vytorin and followed by PCP    Lab Results  Component Value Date   CHOL 116 08/21/2016   HDL 48.10 08/21/2016   LDLCALC 57 08/21/2016   TRIG 55.0 08/21/2016   CHOLHDL 2 08/21/2016     Last foot exam in 11/17 showing peripheral vascular disease and only mild neuropathy   Examination:   BP (!) 106/54   Pulse 67   Ht 6\' 2"  (1.88 m)   Wt 259 lb (117.5 kg)   SpO2 93%   BMI 33.25 kg/m   Body mass index is 33.25 kg/m.    Assesment/PLAN:   Diabetes type 2, Mild and associated with obesity    The patient's diabetes is overall well controlled with normal A1c again in the normal range, Improved at 5.4  He has consistent control of his diabetes since his diagnosis in 2012 with maximum dose metformin alone Diet is usually fairly good His weight is variable but overall somewhat better recently He is trying to do a little more activity with walking Since his blood sugars are mostly near normal including postprandial he can reduce his metformin to 1500 mg a day; he does appear to have occasional loose stools with this  HYPERTENSION: He will follow-up with PCP and cardiologist  Follow-up in 6 months  Teola Felipe  08/23/2016, 3:29 PM   Note: This office note was prepared with Dragon voice recognition system technology. Any transcriptional errors that result from this process are unintentional.

## 2016-08-23 NOTE — Patient Instructions (Signed)
Take 2 metformin  Daily  Check blood sugars on waking up  2/7 days  Also check blood sugars about 2 hours after a meal and do this after different meals by rotation  Recommended blood sugar levels on waking up is 80-120 and about 2 hours after meal is 110-160  Please bring your blood sugar monitor to each visit, thank you

## 2016-08-24 DIAGNOSIS — R609 Edema, unspecified: Secondary | ICD-10-CM | POA: Diagnosis not present

## 2016-08-24 DIAGNOSIS — L02419 Cutaneous abscess of limb, unspecified: Secondary | ICD-10-CM | POA: Diagnosis not present

## 2016-08-29 DIAGNOSIS — L03115 Cellulitis of right lower limb: Secondary | ICD-10-CM | POA: Diagnosis not present

## 2016-09-05 DIAGNOSIS — L03115 Cellulitis of right lower limb: Secondary | ICD-10-CM | POA: Diagnosis not present

## 2016-09-08 ENCOUNTER — Other Ambulatory Visit: Payer: Self-pay | Admitting: Endocrinology

## 2016-10-02 ENCOUNTER — Ambulatory Visit (INDEPENDENT_AMBULATORY_CARE_PROVIDER_SITE_OTHER): Payer: Medicare Other | Admitting: *Deleted

## 2016-10-02 DIAGNOSIS — I359 Nonrheumatic aortic valve disorder, unspecified: Secondary | ICD-10-CM | POA: Diagnosis not present

## 2016-10-02 DIAGNOSIS — Z952 Presence of prosthetic heart valve: Secondary | ICD-10-CM | POA: Diagnosis not present

## 2016-10-02 DIAGNOSIS — Z5181 Encounter for therapeutic drug level monitoring: Secondary | ICD-10-CM

## 2016-10-02 DIAGNOSIS — I4891 Unspecified atrial fibrillation: Secondary | ICD-10-CM | POA: Diagnosis not present

## 2016-10-02 LAB — POCT INR: INR: 2.9

## 2016-10-05 ENCOUNTER — Encounter: Payer: Self-pay | Admitting: Internal Medicine

## 2016-10-05 ENCOUNTER — Telehealth: Payer: Self-pay | Admitting: Endocrinology

## 2016-10-05 ENCOUNTER — Ambulatory Visit (INDEPENDENT_AMBULATORY_CARE_PROVIDER_SITE_OTHER): Payer: Medicare Other | Admitting: Internal Medicine

## 2016-10-05 ENCOUNTER — Other Ambulatory Visit: Payer: Self-pay

## 2016-10-05 VITALS — BP 120/78 | HR 66 | Temp 97.8°F | Ht 74.0 in | Wt 255.0 lb

## 2016-10-05 DIAGNOSIS — D696 Thrombocytopenia, unspecified: Secondary | ICD-10-CM | POA: Diagnosis not present

## 2016-10-05 DIAGNOSIS — Z23 Encounter for immunization: Secondary | ICD-10-CM

## 2016-10-05 DIAGNOSIS — E669 Obesity, unspecified: Secondary | ICD-10-CM | POA: Diagnosis not present

## 2016-10-05 DIAGNOSIS — R609 Edema, unspecified: Secondary | ICD-10-CM | POA: Diagnosis not present

## 2016-10-05 DIAGNOSIS — Z7901 Long term (current) use of anticoagulants: Secondary | ICD-10-CM | POA: Diagnosis not present

## 2016-10-05 MED ORDER — VASCULERA PO TABS: 1.0000 | ORAL_TABLET | Freq: Every day | ORAL | 11 refills | Status: DC

## 2016-10-05 MED ORDER — METFORMIN HCL ER 750 MG PO TB24: 1500.0000 mg | ORAL_TABLET | Freq: Every day | ORAL | 2 refills | Status: DC

## 2016-10-05 NOTE — Patient Instructions (Addendum)

## 2016-10-05 NOTE — Assessment & Plan Note (Signed)
On Coumadin 

## 2016-10-05 NOTE — Telephone Encounter (Signed)
Please submit a new script for metformin bid per 08/23/2016 visit note.

## 2016-10-05 NOTE — Assessment & Plan Note (Signed)
Elevate LEs Compression socks Vasculera

## 2016-10-05 NOTE — Progress Notes (Signed)
Subjective:  Patient ID: [name], male    DOB: [birth year]  Age: 81 y.o. MRN: [id number]  CC: No chief complaint on file.   HPI [name] presents for leg edema, venous insufficiency, HTN, B12 def  Outpatient Medications Prior to Visit  Medication Sig Dispense Refill  . carvedilol (COREG) 25 MG tablet Take 1 tablet (25 mg total) by mouth 2 (two) times daily with a meal. 180 tablet 3  . Cyanocobalamin (VITAMIN B12) 3000 MCG/ML LIQD Place 2,000 mcg under the tongue daily.    . enalapril (VASOTEC) 20 MG tablet TAKE 1 TABLET BY MOUTH EVERY DAY 90 tablet 2  . ezetimibe-simvastatin (VYTORIN) 10-20 MG per tablet Take 1 tablet by mouth at bedtime. 90 tablet 2  . fluocinonide cream (LIDEX) 4.16 % Apply 1 application topically 2 (two) times daily.     . folic acid (FOLVITE) 1 MG tablet Take 1 tablet (1 mg total) by mouth daily.    . furosemide (LASIX) 40 MG tablet Take 1 tablet (40 mg total) by mouth daily. 30 tablet 0  . glucose blood (ONE TOUCH ULTRA TEST) test strip USE AS DIRECTED TO TEST BLOOD GLUCOSE  EVERY OTHER DAY DX: 250.00 50 each 2  . metFORMIN (GLUCOPHAGE-XR) 750 MG 24 hr tablet TAKE 3 TABLETS BY MOUTH EVERY DAY 270 tablet 1  . NON FORMULARY BIPAP    . NON FORMULARY ONE TOUCH ULTRA TEST STRIPS  AND LANCETS    . ONETOUCH DELICA LANCETS FINE MISC Use to obtain a blood specimen every other day Dx code 250.00 100 each 1  . PROAIR HFA 108 (90 BASE) MCG/ACT inhaler Inhale 2 puffs into the lungs every 6 (six) hours as needed. For chest congestion    . silver sulfADIAZINE (SILVADENE) 1 % cream Apply 1 application topically daily. 25 g 0  . warfarin (COUMADIN) 5 MG tablet TAKE AS DIRECTED BY COUMADIN CLINIC 45 tablet 3   No facility-administered medications prior to visit.     ROS Review of Systems  Constitutional: Negative for appetite change, fatigue and unexpected weight change.  HENT: Negative for congestion, nosebleeds, sneezing, sore throat and trouble swallowing.   Eyes:  Negative for itching and visual disturbance.  Respiratory: Negative for cough.   Cardiovascular: Positive for leg swelling. Negative for chest pain and palpitations.  Gastrointestinal: Negative for abdominal distention, blood in stool, diarrhea and nausea.  Genitourinary: Negative for frequency and hematuria.  Musculoskeletal: Negative for back pain, gait problem, joint swelling and neck pain.  Skin: Positive for color change. Negative for rash and wound.  Neurological: Negative for dizziness, tremors, speech difficulty and weakness.  Psychiatric/Behavioral: Negative for agitation, dysphoric mood and sleep disturbance. The patient is not nervous/anxious.     Objective:  BP 120/78 (BP Location: Right Arm, Patient Position: Sitting, Cuff Size: Large)   Pulse 66   Temp 97.8 F (36.6 C) (Oral)   Ht 6\' 2"  (1.88 m)   Wt 255 lb (115.7 kg)   SpO2 96%   BMI 32.74 kg/m   BP Readings from Last 3 Encounters:  10/05/16 120/78  08/23/16 (!) 106/54  07/20/16 (!) 105/46    Wt Readings from Last 3 Encounters:  10/05/16 255 lb (115.7 kg)  08/23/16 259 lb (117.5 kg)  07/20/16 263 lb 7 oz (119.5 kg)    Physical Exam  Constitutional: He is oriented to person, place, and time. He appears well-developed. No distress.  NAD  HENT:  Mouth/Throat: Oropharynx is clear and moist.  Eyes: Conjunctivae  are normal. Pupils are equal, round, and reactive to light.  Neck: Normal range of motion. No JVD present. No thyromegaly present.  Cardiovascular: Normal rate, regular rhythm, normal heart sounds and intact distal pulses.  Exam reveals no gallop and no friction rub.   No murmur heard. Pulmonary/Chest: Effort normal and breath sounds normal. No respiratory distress. He has no wheezes. He has no rales. He exhibits no tenderness.  Abdominal: Soft. Bowel sounds are normal. He exhibits no distension and no mass. There is no tenderness. There is no rebound and no guarding.  Musculoskeletal: Normal range of  motion. He exhibits edema. He exhibits no tenderness.  Lymphadenopathy:    He has no cervical adenopathy.  Neurological: He is alert and oriented to person, place, and time. He has normal reflexes. No cranial nerve deficit. He exhibits normal muscle tone. He displays a negative Romberg sign. Coordination and gait normal.  Skin: Skin is warm and dry. Rash noted.  Psychiatric: He has a normal mood and affect. His behavior is normal. Judgment and thought content normal.  RLE>LLE edema with more hyperpigmentation on the R Obese   No results found.  Assessment & Plan:   There are no diagnoses linked to this encounter. I am having [name] maintain his NON FORMULARY, fluocinonide cream, NON FORMULARY, ezetimibe-simvastatin, ONETOUCH DELICA LANCETS FINE, PROAIR HFA, Vitamin Y57, folic acid, warfarin, silver sulfADIAZINE, furosemide, carvedilol, enalapril, glucose blood, and metFORMIN.  No orders of the defined types were placed in this encounter.    Follow-up: No Follow-up on file.  Walker Kehr, MD

## 2016-10-05 NOTE — Telephone Encounter (Signed)
Called patient and spoke to [name] his wife and let her know that his Metformin has been sent to the pharmacy.

## 2016-10-05 NOTE — Assessment & Plan Note (Signed)
Cont w/wt loss 

## 2016-10-05 NOTE — Assessment & Plan Note (Signed)
Labs

## 2016-10-09 ENCOUNTER — Other Ambulatory Visit: Payer: Self-pay

## 2016-10-19 ENCOUNTER — Other Ambulatory Visit (HOSPITAL_BASED_OUTPATIENT_CLINIC_OR_DEPARTMENT_OTHER): Payer: Medicare Other

## 2016-10-19 DIAGNOSIS — D696 Thrombocytopenia, unspecified: Secondary | ICD-10-CM

## 2016-10-19 DIAGNOSIS — D7589 Other specified diseases of blood and blood-forming organs: Secondary | ICD-10-CM

## 2016-10-19 LAB — CBC & DIFF AND RETIC
BASO%: 0.6 % (ref 0.0–2.0)
BASOS ABS: 0 10*3/uL (ref 0.0–0.1)
EOS ABS: 0.1 10*3/uL (ref 0.0–0.5)
EOS%: 1.9 % (ref 0.0–7.0)
HCT: 36.1 % — ABNORMAL LOW (ref 38.4–49.9)
HEMOGLOBIN: 11.4 g/dL — AB (ref 13.0–17.1)
IMMATURE RETIC FRACT: 11.1 % — AB (ref 3.00–10.60)
LYMPH%: 23.9 % (ref 14.0–49.0)
MCH: 35 pg — AB (ref 27.2–33.4)
MCHC: 31.6 g/dL — ABNORMAL LOW (ref 32.0–36.0)
MCV: 110.7 fL — ABNORMAL HIGH (ref 79.3–98.0)
MONO#: 0.3 10*3/uL (ref 0.1–0.9)
MONO%: 9.1 % (ref 0.0–14.0)
NEUT#: 2.1 10*3/uL (ref 1.5–6.5)
NEUT%: 64.5 % (ref 39.0–75.0)
Platelets: 57 10*3/uL — ABNORMAL LOW (ref 140–400)
RBC: 3.26 10*6/uL — ABNORMAL LOW (ref 4.20–5.82)
RDW: 16.2 % — AB (ref 11.0–14.6)
RETIC %: 2.13 % — AB (ref 0.80–1.80)
Retic Ct Abs: 69.44 10*3/uL (ref 34.80–93.90)
WBC: 3.2 10*3/uL — ABNORMAL LOW (ref 4.0–10.3)
lymph#: 0.8 10*3/uL — ABNORMAL LOW (ref 0.9–3.3)

## 2016-10-20 ENCOUNTER — Other Ambulatory Visit: Payer: Self-pay

## 2016-11-01 ENCOUNTER — Other Ambulatory Visit: Payer: Self-pay | Admitting: Interventional Cardiology

## 2016-11-02 ENCOUNTER — Ambulatory Visit (INDEPENDENT_AMBULATORY_CARE_PROVIDER_SITE_OTHER): Payer: Medicare Other | Admitting: *Deleted

## 2016-11-02 VITALS — BP 128/68 | HR 70 | Resp 20 | Ht 74.0 in | Wt 259.0 lb

## 2016-11-02 DIAGNOSIS — Z0000 Encounter for general adult medical examination without abnormal findings: Secondary | ICD-10-CM | POA: Diagnosis not present

## 2016-11-02 NOTE — Patient Instructions (Signed)
Continue doing brain stimulating activities (puzzles, reading, adult coloring books, staying active) to keep memory sharp.   Continue to eat heart healthy diet (full of fruits, vegetables, whole grains, lean protein, water--limit salt, fat, and sugar intake) and increase physical activity as tolerated.   Mr. Schoenberger , Thank you for taking time to come for your Medicare Wellness Visit. I appreciate your ongoing commitment to your health goals. Please review the following plan we discussed and let me know if I can assist you in the future.   These are the goals we discussed: Goals    . Exercise 3 days per week          Walk in my neighborhood, and investigate about joining health center.  Think about doing fun activities outside.       This is a list of the screening recommended for you and due dates:  Health Maintenance  Topic Date Due  . Eye exam for diabetics  04/10/1945  . Flu Shot  11/01/2016  . Complete foot exam   02/15/2017  . Hemoglobin A1C  02/21/2017  . Tetanus Vaccine  08/30/2018  . Pneumonia vaccines  Completed

## 2016-11-02 NOTE — Progress Notes (Signed)
Pre visit review using our clinic review tool, if applicable. No additional management support is needed unless otherwise documented below in the visit note. 

## 2016-11-02 NOTE — Progress Notes (Addendum)
Subjective:   [name] is a 81 y.o. male who presents for an Initial Medicare Annual Wellness Visit.  Review of Systems  No ROS.  Medicare Wellness Visit. Additional risk factors are reflected in the social history.    Sleep patterns: feels rested on waking, gets up 1 times nightly to void and sleeps 7-8 hours nightly.  Wears BI-Pap  Home Safety/Smoke Alarms: Feels safe in home. Smoke alarms in place.  Living environment; residence and Firearm Safety: 1-story house/ trailer, no firearms. Lives with wife, no needs for DME, good support system Seat Belt Safety/Bike Helmet: Wears seat belt.   Counseling:   Eye Exam- Last 2 years, patient states he will make an appointment Dental- resources provided  Male:   CCS- N/D     PSA- No results found for: PSA    Objective:    There were no vitals filed for this visit. There is no height or weight on file to calculate BMI.  Current Medications (verified) Outpatient Encounter Prescriptions as of 11/02/2016  Medication Sig  . carvedilol (COREG) 25 MG tablet Take 1 tablet (25 mg total) by mouth 2 (two) times daily with a meal.  . Cyanocobalamin (VITAMIN B12) 3000 MCG/ML LIQD Place 2,000 mcg under the tongue daily.  . Dietary Management Product (VASCULERA) TABS Take 1 tablet by mouth daily.  . enalapril (VASOTEC) 20 MG tablet TAKE 1 TABLET BY MOUTH EVERY DAY  . ezetimibe-simvastatin (VYTORIN) 10-20 MG per tablet Take 1 tablet by mouth at bedtime.  . fluocinonide cream (LIDEX) 6.71 % Apply 1 application topically 2 (two) times daily.   . folic acid (FOLVITE) 1 MG tablet Take 1 tablet (1 mg total) by mouth daily.  . furosemide (LASIX) 40 MG tablet Take 1 tablet (40 mg total) by mouth daily.  Marland Kitchen glucose blood (ONE TOUCH ULTRA TEST) test strip USE AS DIRECTED TO TEST BLOOD GLUCOSE  EVERY OTHER DAY DX: 250.00  . metFORMIN (GLUCOPHAGE-XR) 750 MG 24 hr tablet Take 2 tablets (1,500 mg total) by mouth daily.  . NON FORMULARY BIPAP  . NON  FORMULARY ONE TOUCH ULTRA TEST STRIPS  AND LANCETS  . ONETOUCH DELICA LANCETS FINE MISC Use to obtain a blood specimen every other day Dx code 250.00  . PROAIR HFA 108 (90 BASE) MCG/ACT inhaler Inhale 2 puffs into the lungs every 6 (six) hours as needed. For chest congestion  . silver sulfADIAZINE (SILVADENE) 1 % cream Apply 1 application topically daily.  Marland Kitchen warfarin (COUMADIN) 5 MG tablet TAKE AS DIRECTED BY COUMADIN CLINIC  . warfarin (COUMADIN) 5 MG tablet TAKE AS DIRECTED BY COUMADIN CLINIC   No facility-administered encounter medications on file as of 11/02/2016.     Allergies (verified) Rifampin and Vancomycin   History: Past Medical History:  Diagnosis Date  . Aortic stenosis   . Carotid artery disease ([place]) 1994   s/p left carotid endarerectomy   . CHF with unknown LVEF ([facility])    47 %  . Chronic atrial fibrillation ([place])   . Hx of CABG   . Hypertension   . Left ventricular dysfunction 2007   LVEF 47%   . OSA (obstructive sleep apnea)   . Rheumatic fever   . Subclavian bypass stenosis ([place])   . Vitamin B12 deficiency    Past Surgical History:  Procedure Laterality Date  . [place]   replaced due to aortic stenosis, St. Jude mechanical prostesis  . CAROTID ENDARTERECTOMY Left 1997   subclavian bypass Done  in [place]  . CORONARY ARTERY BYPASS GRAFT  1994   w SVG to RCA and SVG to circumflex  . heart bypass     Done in [place]   Family History  Problem Relation Age of Onset  . Cancer Father        lung   . Hypertension Mother    Social History   Occupational History  . Not on file.   Social History Main Topics  . Smoking status: Former Smoker    Packs/day: 1.00    Years: 30.00    Types: Cigarettes    Quit date: 04/03/1992  . Smokeless tobacco: Never Used  . Alcohol use Yes     Comment: 4-6 ounces of [name] daily  . Drug use: No  . Sexual activity: Not on file   Tobacco Counseling Counseling given: Not Answered   Activities of  Daily Living No flowsheet data found.  Immunizations and Health Maintenance Immunization History  Administered Date(s) Administered  . Influenza Split 12/12/2008, 01/25/2010, 12/17/2010  . Influenza, High Dose Seasonal PF 02/16/2016  . Influenza,inj,Quad PF,36+ Mos 01/30/2013, 02/13/2014, 02/15/2015  . Pneumococcal Conjugate-13 01/02/2003  . Pneumococcal Polysaccharide-23 10/05/2016  . Td 04/03/1998, 08/29/2008  . Zoster 04/16/2006   Health Maintenance Due  Topic Date Due  . OPHTHALMOLOGY EXAM  04/10/1945  . INFLUENZA VACCINE  11/01/2016    Patient Care Team: [name], [name], MD as PCP - General (Internal Medicine) Brunetta Genera, MD as Consulting Physician (Hematology) Belva Crome, MD as Consulting Physician (Cardiology) Elayne Snare, MD as Consulting Physician (Endocrinology)  Indicate any recent Medical Services you may have received from other than [facility] providers in the past year (date may be approximate).    Assessment:   This is a routine wellness examination for [name].Physical assessment deferred to PCP.   Hearing/Vision screen No exam data present  Dietary issues and exercise activities discussed:   Diet (meal preparation, eat out, water intake, caffeinated beverages, dairy products, fruits and vegetables): in general, a "healthy" diet  , well balanced, eats a variety of fruits and vegetables daily, limits salt, fat/cholesterol, sugar, caffeine, drinks 1-2 glasses of water daily.  encouraged patient to increase daily water intake.       Goals    None     Depression Screen PHQ 2/9 Scores 10/05/2016  PHQ - 2 Score 0    Fall Risk Fall Risk  10/05/2016  Falls in the past year? No    Cognitive Function:       Ad8 score reviewed for issues:  Issues making decisions: no  Less interest in hobbies / activities: no  Repeats questions, stories (family complaining): no  Trouble using ordinary gadgets (microwave, computer, phone):no  Forgets  the month or year: no  Mismanaging finances: no  Remembering appts: no  Daily problems with thinking and/or memory: no Ad8 score is= 0    Screening Tests Health Maintenance  Topic Date Due  . OPHTHALMOLOGY EXAM  04/10/1945  . INFLUENZA VACCINE  11/01/2016  . FOOT EXAM  02/15/2017  . HEMOGLOBIN A1C  02/21/2017  . TETANUS/TDAP  08/30/2018  . PNA vac Low Risk Adult  Completed        Plan:     Continue doing brain stimulating activities (puzzles, reading, adult coloring books, staying active) to keep memory sharp.   Continue to eat heart healthy diet (full of fruits, vegetables, whole grains, lean protein, water--limit salt, fat, and sugar intake) and increase physical activity as tolerated.  I  have personally reviewed and noted the following in the patient's chart:   . Medical and social history . Use of alcohol, tobacco or illicit drugs  . Current medications and supplements . Functional ability and status . Nutritional status . Physical activity . Advanced directives . List of other physicians . Vitals . Screenings to include cognitive, depression, and falls . Referrals and appointments  In addition, I have reviewed and discussed with patient certain preventive protocols, quality metrics, and best practice recommendations. A written personalized care plan for preventive services as well as general preventive health recommendations were provided to patient.     Michiel Cowboy, RN   11/02/2016   Medical screening examination/treatment/procedure(s) were performed by non-physician practitioner and as supervising physician I was immediately available for consultation/collaboration. I agree with above. Walker Kehr, MD

## 2016-11-13 ENCOUNTER — Ambulatory Visit (INDEPENDENT_AMBULATORY_CARE_PROVIDER_SITE_OTHER): Payer: Medicare Other

## 2016-11-13 DIAGNOSIS — I4891 Unspecified atrial fibrillation: Secondary | ICD-10-CM | POA: Diagnosis not present

## 2016-11-13 DIAGNOSIS — I359 Nonrheumatic aortic valve disorder, unspecified: Secondary | ICD-10-CM

## 2016-11-13 DIAGNOSIS — Z952 Presence of prosthetic heart valve: Secondary | ICD-10-CM

## 2016-11-13 DIAGNOSIS — Z5181 Encounter for therapeutic drug level monitoring: Secondary | ICD-10-CM

## 2016-11-13 LAB — POCT INR: INR: 3.1

## 2016-11-24 ENCOUNTER — Telehealth: Payer: Self-pay | Admitting: Internal Medicine

## 2016-11-24 MED ORDER — VASCULERA PO TABS: 1.0000 | ORAL_TABLET | Freq: Every day | ORAL | 3 refills | Status: DC

## 2016-11-24 NOTE — Telephone Encounter (Signed)
Pt called in and would like a 90 day supply Dietary Management Product [facility]) TABS [[id number]]   [facility]

## 2016-11-24 NOTE — Telephone Encounter (Signed)
Reviewed chart pt is up-to-date sent refills to [facility].../.LMB

## 2016-12-05 DIAGNOSIS — H2513 Age-related nuclear cataract, bilateral: Secondary | ICD-10-CM | POA: Diagnosis not present

## 2016-12-05 DIAGNOSIS — E119 Type 2 diabetes mellitus without complications: Secondary | ICD-10-CM | POA: Diagnosis not present

## 2016-12-05 DIAGNOSIS — H43813 Vitreous degeneration, bilateral: Secondary | ICD-10-CM | POA: Diagnosis not present

## 2016-12-20 ENCOUNTER — Telehealth: Payer: Self-pay | Admitting: Internal Medicine

## 2016-12-20 MED ORDER — FUROSEMIDE 40 MG PO TABS: 40.0000 mg | ORAL_TABLET | Freq: Every day | ORAL | 3 refills | Status: DC

## 2016-12-20 NOTE — Telephone Encounter (Signed)
Pt called in and needs refill on furosemide (LASIX) 40 MG tablet [[id number]]    [facility]

## 2016-12-20 NOTE — Telephone Encounter (Signed)
Reviewed chart pt is up-to-date sent refills to [facility].../lmb  

## 2016-12-25 ENCOUNTER — Ambulatory Visit (INDEPENDENT_AMBULATORY_CARE_PROVIDER_SITE_OTHER): Payer: Medicare Other

## 2016-12-25 DIAGNOSIS — I4891 Unspecified atrial fibrillation: Secondary | ICD-10-CM

## 2016-12-25 DIAGNOSIS — Z952 Presence of prosthetic heart valve: Secondary | ICD-10-CM

## 2016-12-25 DIAGNOSIS — I359 Nonrheumatic aortic valve disorder, unspecified: Secondary | ICD-10-CM | POA: Diagnosis not present

## 2016-12-25 DIAGNOSIS — Z5181 Encounter for therapeutic drug level monitoring: Secondary | ICD-10-CM | POA: Diagnosis not present

## 2016-12-25 LAB — POCT INR: INR: 5.1

## 2017-01-08 ENCOUNTER — Ambulatory Visit (INDEPENDENT_AMBULATORY_CARE_PROVIDER_SITE_OTHER): Payer: Medicare Other

## 2017-01-08 DIAGNOSIS — Z5181 Encounter for therapeutic drug level monitoring: Secondary | ICD-10-CM

## 2017-01-08 DIAGNOSIS — Z952 Presence of prosthetic heart valve: Secondary | ICD-10-CM

## 2017-01-08 DIAGNOSIS — I4891 Unspecified atrial fibrillation: Secondary | ICD-10-CM | POA: Diagnosis not present

## 2017-01-08 DIAGNOSIS — I359 Nonrheumatic aortic valve disorder, unspecified: Secondary | ICD-10-CM

## 2017-01-08 LAB — POCT INR: INR: 4.1

## 2017-01-15 NOTE — Progress Notes (Signed)
[facility]   Hematology Clinic Note  Date of service: 01/19/2017    Patient Care Team: Cassandria Anger, MD as PCP - General (Internal Medicine) Brunetta Genera, MD as Consulting Physician (Hematology) Belva Crome, MD as Consulting Physician (Cardiology) Elayne Snare, MD as Consulting Physician (Endocrinology)  CHIEF COMPLAINTS: follow-up for Macrocytosis and Thrombocytopenia.  HISTORY OF PRESENTING ILLNESS: Please see my initial note for details on initial presentation.  Interval history:  [name] is here for scheduled follow-up regarding his thrombocytopenia. He was last seen by me 6 months ago. He presents to the clinic today noting he is taking his coumadin and his blood in running thin. He has adjusted his dosage. He is also taking his folic acid as prescibed. He has not had any green vegetables in some time and he thinks that may had contributed to his change in coumadin. He denies any bleeding and he feels well in general.   MEDICAL HISTORY:  Past Medical History:  Diagnosis Date  . Aortic stenosis   . Carotid artery disease ([place]) 1994   s/p left carotid endarerectomy   . CHF with unknown LVEF (HCC)    47 %  . Chronic atrial fibrillation ([place])   . Hx of CABG   . Hypertension   . Left ventricular dysfunction 2007   LVEF 47%   . OSA (obstructive sleep apnea)   . Rheumatic fever   . Subclavian bypass stenosis ([place])   . Vitamin B12 deficiency     SURGICAL HISTORY: Past Surgical History:  Procedure Laterality Date  . [place]   replaced due to aortic stenosis, St. Jude mechanical prostesis  . CAROTID ENDARTERECTOMY Left 1997   subclavian bypass Done in [place]  . CORONARY ARTERY BYPASS GRAFT  1994   w SVG to RCA and SVG to circumflex  . heart bypass     Done in [place]    SOCIAL HISTORY: Social History   Social History  . Marital status: Married    Spouse name: N/A  . Number of children: N/A  . Years of  education: N/A   Occupational History  . Not on file.   Social History Main Topics  . Smoking status: Former Smoker    Packs/day: 1.00    Years: 30.00    Types: Cigarettes    Quit date: 04/03/1992  . Smokeless tobacco: Never Used  . Alcohol use Yes     Comment: 4-6 ounces of wine daily  . Drug use: No  . Sexual activity: Not on file   Other Topics Concern  . Not on file   Social History Narrative  . No narrative on file    FAMILY HISTORY: Family History  Problem Relation Age of Onset  . Cancer Father        lung   . Hypertension Mother     ALLERGIES:  is allergic to rifampin and vancomycin.  MEDICATIONS:  Current Outpatient Prescriptions  Medication Sig Dispense Refill  . carvedilol (COREG) 25 MG tablet Take 1 tablet (25 mg total) by mouth 2 (two) times daily with a meal. 180 tablet 3  . Cyanocobalamin (VITAMIN B12) 3000 MCG/ML LIQD Place 2,000 mcg under the tongue daily.    . Dietary Management Product (VASCULERA) TABS Take 1 tablet by mouth daily. 90 tablet 3  . enalapril (VASOTEC) 20 MG tablet TAKE 1 TABLET BY MOUTH EVERY DAY 90 tablet 2  . ezetimibe-simvastatin (VYTORIN) 10-20 MG per tablet Take 1 tablet  by mouth at bedtime. 90 tablet 2  . fluocinonide cream (LIDEX) 2.77 % Apply 1 application topically 2 (two) times daily.     . folic acid (FOLVITE) 1 MG tablet Take 1 tablet (1 mg total) by mouth daily.    . furosemide (LASIX) 40 MG tablet Take 1 tablet (40 mg total) by mouth daily. 30 tablet 3  . glucose blood (ONE TOUCH ULTRA TEST) test strip USE AS DIRECTED TO TEST BLOOD GLUCOSE  EVERY OTHER DAY DX: 250.00 50 each 2  . metFORMIN (GLUCOPHAGE-XR) 750 MG 24 hr tablet Take 2 tablets (1,500 mg total) by mouth daily. 180 tablet 2  . NON FORMULARY BIPAP    . NON FORMULARY ONE TOUCH ULTRA TEST STRIPS  AND LANCETS    . ONETOUCH DELICA LANCETS FINE MISC Use to obtain a blood specimen every other day Dx code 250.00 100 each 1  . PROAIR HFA 108 (90 BASE) MCG/ACT inhaler  Inhale 2 puffs into the lungs every 6 (six) hours as needed. For chest congestion    . warfarin (COUMADIN) 5 MG tablet TAKE AS DIRECTED BY COUMADIN CLINIC 45 tablet 3   No current facility-administered medications for this visit.     REVIEW OF SYSTEMS:    10 point review of system is negative other than that mentioned above.  PHYSICAL EXAMINATION: ECOG PERFORMANCE STATUS: 1 - Symptomatic but completely ambulatory  Vitals:   01/19/17 0914  BP: (!) 112/49  Pulse: 85  Resp: 17  Temp: 98.5 F (36.9 C)  SpO2: 92%   Filed Weights   01/19/17 0914  Weight: 263 lb 6.4 oz (119.5 kg)    GENERAL:alert, no distress and comfortable SKIN: skin color, texture, turgor are normal, no rashes no petechiae. EYES: normal, conjunctiva are pink and non-injected, sclera clear OROPHARYNX: no exudate, no erythema and lips, buccal mucosa, and tongue normal  NECK: supple, thyroid normal size, non-tender, without nodularity LYMPH:  no palpable lymphadenopathy in the cervical, axillary or inguinal LUNGS: clear to auscultation and percussion with normal breathing effort HEART: regular rate & rhythm and no murmurs and clear click of his metallic aortic valve noted. (+) 2+ pitting edema in bilateral lower extremities ABDOMEN:abdomen soft, non-tender and normal bowel sounds Musculoskeletal:no cyanosis of digits and no clubbing  PSYCH: alert & oriented x 3 with fluent speech NEURO: no focal motor/sensory deficits  LABORATORY DATA:   . CBC Latest Ref Rng & Units 01/19/2017 10/19/2016 07/20/2016  WBC 4.0 - 10.3 10e3/uL 3.8(L) 3.2(L) 3.3(L)  Hemoglobin 13.0 - 17.1 g/dL 12.0(L) 11.4(L) 12.0(L)  Hematocrit 38.4 - 49.9 % 37.8(L) 36.1(L) 38.0(L)  Platelets 140 - 400 10e3/uL 54(L) 57(L) 64(L)    . CMP Latest Ref Rng & Units 01/19/2017 08/21/2016 07/20/2016  Glucose 70 - 140 mg/dl 103 102(H) 110  BUN 7.0 - 26.0 mg/dL 28.4(H) 33(H) 32.9(H)  Creatinine 0.7 - 1.3 mg/dL 1.2 1.11 1.3  Sodium 136 - 145 mEq/L 140 139  142  Potassium 3.5 - 5.1 mEq/L 4.8 4.8 5.1  Chloride 96 - 112 mEq/L - 109 -  CO2 22 - 29 mEq/L _0 Calcium 8.4 - 10.4 mg/dL 9.7 9.4 9.5  Total Protein 6.4 - 8.3 g/dL 7.4 - 7.2  Total Bilirubin 0.20 - 1.20 mg/dL 1.03 - 1.07  Alkaline Phos 40 - 150 U/L 146 - 112  AST 5 - 34 U/L 20 - 18  ALT 0 - 55 U/L 13 - 11    RADIOGRAPHIC STUDIES: I have personally reviewed the radiological  images as listed and agreed with the findings in the report. No results found.  ASSESSMENT & PLAN   1) Thrombocytopenia. Moderate. Platelet counts are stable and the same ballpark in 60-70k  range for the last >1 year, early 2018. 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 + cannot rule out ITP element. Could have possible element of MDS. He has some pelgeroid neutrophils on his peripheral blood smear that are suggestive of possible MDS. Hepatitis B, and C serologies negative. No evidence of a lymphoproliferative syndrome on peripheral blood smear or clinical examination. SPEP showed no monoclonal protein.  Plan - patient's platelets remain low but stable at 54. -If platelet counts drop below 50K might need to limit the INR fluctuations on Coumadin from 2-3 more strictly. -continue absolute alcohol cessation -Avoid NSAIDS and other medications known to cause thrombocytopenia especially ranitidine and other  H2 blockers, sulfa drugs, PPIs. -continue vitamin B12 2000 g sublingually daily and Folic acid 1 mg by mouth daily to support accelerated hematopoeisis with valve hemolysis. -if worsening cytopenias might consider a bone marrow biopsy. -He will repeat labs in 3 months with his PCP Andrew. Alain Beard and 6 months with his follow up in our clinic -Coumadin monitoring as per cardiology clinic -Offered Flu vaccination today and pt agreed. He will need a subcutaneous shot due to his plt and coumadin levels. -if plts <50k (due to  anticoagulation) or if bleeding issues might need to consider Nplate/Promacta.  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 Has somewhat elevated LDH and low haptoglobin confirms the presence of low-grade hemolysis. Patient has no clinical evidence of hemoglobinuria. Peripheral blood smear showed minimal rare schistocytes. No micro-spherocytes to suggest autoimmune hemolysis.Coombs negative. 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 PLAN -aggressive B12 replacement - folic acid 1-2 mg po daily to maintain erythropoiesis in the setting of increased demand from chronic hemolysis. -Bone marrow examination if worsening anemia or other cytopenia.  Labs in 3 months with his PCP  RTC with [name] in 6 months with labs Flu shot today, subcutaneously   Total time spent 25 minutes more than 50% of the time in direct patient care counseling and coordination of care.  Andrew Lone MD MS Hematology/Oncology Physician [facility]  (Office):       [phone] (Work cell):  [phone] (Fax):           [phone]  This document serves as a record of services personally performed by Andrew Lone, MD. It was created on her behalf by [name], a trained medical scribe. The creation of this record is based on the scribe's personal observations and the provider's statements to them. This document has been checked and approved by the attending provider.

## 2017-01-19 ENCOUNTER — Ambulatory Visit (HOSPITAL_BASED_OUTPATIENT_CLINIC_OR_DEPARTMENT_OTHER): Payer: Medicare Other | Admitting: Hematology

## 2017-01-19 ENCOUNTER — Other Ambulatory Visit (HOSPITAL_BASED_OUTPATIENT_CLINIC_OR_DEPARTMENT_OTHER): Payer: Medicare Other

## 2017-01-19 ENCOUNTER — Encounter: Payer: Self-pay | Admitting: Hematology

## 2017-01-19 ENCOUNTER — Telehealth: Payer: Self-pay

## 2017-01-19 VITALS — BP 112/49 | HR 85 | Temp 98.5°F | Resp 17 | Ht 74.0 in | Wt 263.4 lb

## 2017-01-19 DIAGNOSIS — D7589 Other specified diseases of blood and blood-forming organs: Secondary | ICD-10-CM

## 2017-01-19 DIAGNOSIS — D696 Thrombocytopenia, unspecified: Secondary | ICD-10-CM

## 2017-01-19 DIAGNOSIS — Z23 Encounter for immunization: Secondary | ICD-10-CM | POA: Diagnosis not present

## 2017-01-19 LAB — COMPREHENSIVE METABOLIC PANEL
ALBUMIN: 3.8 g/dL (ref 3.5–5.0)
ALK PHOS: 146 U/L (ref 40–150)
ALT: 13 U/L (ref 0–55)
AST: 20 U/L (ref 5–34)
Anion Gap: 9 mEq/L (ref 3–11)
BILIRUBIN TOTAL: 1.03 mg/dL (ref 0.20–1.20)
BUN: 28.4 mg/dL — ABNORMAL HIGH (ref 7.0–26.0)
CO2: 24 meq/L (ref 22–29)
CREATININE: 1.2 mg/dL (ref 0.7–1.3)
Calcium: 9.7 mg/dL (ref 8.4–10.4)
Chloride: 107 mEq/L (ref 98–109)
EGFR: 58 mL/min/{1.73_m2} — ABNORMAL LOW (ref >60)
Glucose: 103 mg/dl (ref 70–140)
Potassium: 4.8 mEq/L (ref 3.5–5.1)
SODIUM: 140 meq/L (ref 136–145)
TOTAL PROTEIN: 7.4 g/dL (ref 6.4–8.3)

## 2017-01-19 LAB — CBC & DIFF AND RETIC
BASO%: 0.3 % (ref 0.0–2.0)
Basophils Absolute: 0 10*3/uL (ref 0.0–0.1)
EOS ABS: 0.1 10*3/uL (ref 0.0–0.5)
EOS%: 2.3 % (ref 0.0–7.0)
HCT: 37.8 % — ABNORMAL LOW (ref 38.4–49.9)
HGB: 12 g/dL — ABNORMAL LOW (ref 13.0–17.1)
IMMATURE RETIC FRACT: 15.5 % — AB (ref 3.00–10.60)
LYMPH#: 0.8 10*3/uL — AB (ref 0.9–3.3)
LYMPH%: 19.8 % (ref 14.0–49.0)
MCH: 36.1 pg — ABNORMAL HIGH (ref 27.2–33.4)
MCHC: 31.7 g/dL — ABNORMAL LOW (ref 32.0–36.0)
MCV: 113.9 fL — ABNORMAL HIGH (ref 79.3–98.0)
MONO#: 0.3 10*3/uL (ref 0.1–0.9)
MONO%: 7.6 % (ref 0.0–14.0)
NEUT%: 70 % (ref 39.0–75.0)
NEUTROS ABS: 2.7 10*3/uL (ref 1.5–6.5)
PLATELETS: 54 10*3/uL — AB (ref 140–400)
RBC: 3.32 10*6/uL — AB (ref 4.20–5.82)
RDW: 16.2 % — ABNORMAL HIGH (ref 11.0–14.6)
RETIC CT ABS: 69.72 10*3/uL (ref 34.80–93.90)
Retic %: 2.1 % — ABNORMAL HIGH (ref 0.80–1.80)
WBC: 3.8 10*3/uL — ABNORMAL LOW (ref 4.0–10.3)

## 2017-01-19 MED ORDER — INFLUENZA VAC SPLIT HIGH-DOSE 0.5 ML IM SUSY
0.5000 mL | PREFILLED_SYRINGE | INTRAMUSCULAR | Status: AC
  Administered 2017-01-19: 0.5 mL via INTRAMUSCULAR
  Filled 2017-01-19: qty 0.5

## 2017-01-19 NOTE — Telephone Encounter (Signed)
Printed avs and calender for upcoming appointment. Per 10/19 los

## 2017-01-22 ENCOUNTER — Ambulatory Visit (INDEPENDENT_AMBULATORY_CARE_PROVIDER_SITE_OTHER): Payer: Medicare Other | Admitting: *Deleted

## 2017-01-22 DIAGNOSIS — Z952 Presence of prosthetic heart valve: Secondary | ICD-10-CM | POA: Diagnosis not present

## 2017-01-22 DIAGNOSIS — Z5181 Encounter for therapeutic drug level monitoring: Secondary | ICD-10-CM | POA: Diagnosis not present

## 2017-01-22 DIAGNOSIS — I4891 Unspecified atrial fibrillation: Secondary | ICD-10-CM | POA: Diagnosis not present

## 2017-01-22 LAB — POCT INR: INR: 3.8

## 2017-01-25 ENCOUNTER — Other Ambulatory Visit: Payer: Self-pay | Admitting: Interventional Cardiology

## 2017-01-31 ENCOUNTER — Encounter: Payer: Self-pay | Admitting: Interventional Cardiology

## 2017-01-31 ENCOUNTER — Ambulatory Visit (INDEPENDENT_AMBULATORY_CARE_PROVIDER_SITE_OTHER): Payer: Medicare Other | Admitting: Pharmacist Clinician (PhC)/ Clinical Pharmacy Specialist

## 2017-01-31 ENCOUNTER — Ambulatory Visit (INDEPENDENT_AMBULATORY_CARE_PROVIDER_SITE_OTHER): Payer: Medicare Other | Admitting: Interventional Cardiology

## 2017-01-31 VITALS — BP 110/60 | HR 72 | Ht 74.0 in | Wt 267.6 lb

## 2017-01-31 DIAGNOSIS — I10 Essential (primary) hypertension: Secondary | ICD-10-CM | POA: Diagnosis not present

## 2017-01-31 DIAGNOSIS — I4891 Unspecified atrial fibrillation: Secondary | ICD-10-CM

## 2017-01-31 DIAGNOSIS — R0602 Shortness of breath: Secondary | ICD-10-CM

## 2017-01-31 DIAGNOSIS — Z952 Presence of prosthetic heart valve: Secondary | ICD-10-CM

## 2017-01-31 DIAGNOSIS — G4733 Obstructive sleep apnea (adult) (pediatric): Secondary | ICD-10-CM

## 2017-01-31 DIAGNOSIS — I482 Chronic atrial fibrillation, unspecified: Secondary | ICD-10-CM

## 2017-01-31 DIAGNOSIS — I5032 Chronic diastolic (congestive) heart failure: Secondary | ICD-10-CM

## 2017-01-31 DIAGNOSIS — Z5181 Encounter for therapeutic drug level monitoring: Secondary | ICD-10-CM

## 2017-01-31 LAB — POCT INR: INR: 4.7

## 2017-01-31 NOTE — Progress Notes (Signed)
Cardiology Office Note    Date:  01/31/2017   ID:  [name], DOB February 22, 1936, MRN [id number]  PCP:  Cassandria Anger, MD  Cardiologist: [name], MD   Chief Complaint  Patient presents with  . Atrial Fibrillation  . Congestive Heart Failure    History of Present Illness:  [name] is a 81 y.o. male who presents for with mechanical aortic valve, chronic atrial fibrillation, chronic combined systolic and diastolic heart failure, chronic Coumadin therapy, and prior CABG.  [name] that dyspnea on exertion is increasing.  He denies chest pain and orthopnea.  Lower extremity swelling continues to be an issue for him.  It is no worse or better than previous.  He has not had syncope.  He denies PND.  He has not had palpitations.  He is not as active as the previously was.  He sleeps well.   Past Medical History:  Diagnosis Date  . Aortic stenosis   . Carotid artery disease ([place]) 1994   s/p left carotid endarerectomy   . CHF with unknown LVEF (HCC)    47 %  . Chronic atrial fibrillation ([place])   . Hx of CABG   . Hypertension   . Left ventricular dysfunction 2007   LVEF 47%   . OSA (obstructive sleep apnea)   . Rheumatic fever   . Subclavian bypass stenosis ([place])   . Vitamin B12 deficiency     Past Surgical History:  Procedure Laterality Date  . [place]   replaced due to aortic stenosis, St. Jude mechanical prostesis  . CAROTID ENDARTERECTOMY Left 1997   subclavian bypass Done in [place]  . CORONARY ARTERY BYPASS GRAFT  1994   w SVG to RCA and SVG to circumflex  . heart bypass     Done in [place]    Current Medications: Outpatient Medications Prior to Visit  Medication Sig Dispense Refill  . carvedilol (COREG) 25 MG tablet Take 1 tablet (25 mg total) by mouth 2 (two) times daily with a meal. 180 tablet 3  . Cyanocobalamin (VITAMIN B12) 3000 MCG/ML LIQD Place 2,000 mcg under the tongue daily.    . Dietary Management Product  (VASCULERA) TABS Take 1 tablet by mouth daily. 90 tablet 3  . enalapril (VASOTEC) 20 MG tablet TAKE 1 TABLET BY MOUTH EVERY DAY 90 tablet 2  . ezetimibe-simvastatin (VYTORIN) 10-20 MG per tablet Take 1 tablet by mouth at bedtime. 90 tablet 2  . fluocinonide cream (LIDEX) 4.09 % Apply 1 application topically 2 (two) times daily.     . folic acid (FOLVITE) 1 MG tablet Take 1 tablet (1 mg total) by mouth daily.    . furosemide (LASIX) 40 MG tablet Take 1 tablet (40 mg total) by mouth daily. 30 tablet 3  . glucose blood (ONE TOUCH ULTRA TEST) test strip USE AS DIRECTED TO TEST BLOOD GLUCOSE  EVERY OTHER DAY DX: 250.00 50 each 2  . metFORMIN (GLUCOPHAGE-XR) 750 MG 24 hr tablet Take 2 tablets (1,500 mg total) by mouth daily. 180 tablet 2  . NON FORMULARY BIPAP    . NON FORMULARY ONE TOUCH ULTRA TEST STRIPS  AND LANCETS    . ONETOUCH DELICA LANCETS FINE MISC Use to obtain a blood specimen every other day Dx code 250.00 100 each 1  . PROAIR HFA 108 (90 BASE) MCG/ACT inhaler Inhale 2 puffs into the lungs every 6 (six) hours as needed. For chest congestion    .  warfarin (COUMADIN) 5 MG tablet TAKE AS DIRECTED BY COUMADIN CLINIC 45 tablet 3  . warfarin (COUMADIN) 5 MG tablet TAKE AS DIRECTED BY COUMADIN CLINIC 45 tablet 3   No facility-administered medications prior to visit.      Allergies:   Rifampin and Vancomycin   Social History   Social History  . Marital status: Married    Spouse name: N/A  . Number of children: N/A  . Years of education: N/A   Social History Main Topics  . Smoking status: Former Smoker    Packs/day: 1.00    Years: 30.00    Types: Cigarettes    Quit date: 04/03/1992  . Smokeless tobacco: Never Used  . Alcohol use Yes     Comment: 4-6 ounces of wine daily  . Drug use: No  . Sexual activity: Not Asked   Other Topics Concern  . None   Social History Narrative  . None     Family History:  The patient's family history includes Cancer in his father; Hypertension in  his mother.   ROS:   Please see the history of present illness.    Fatigue, easy bruising, but otherwise no complaints.  Sleeping well. All other systems reviewed and are negative.   PHYSICAL EXAM:   VS:  BP 110/60   Pulse 72   Ht 6\' 2"  (1.88 m)   Wt 267 lb 9.6 oz (121.4 kg)   SpO2 91%   BMI 34.36 kg/m    GEN: Well nourished, well developed, in no acute distress  HEENT: normal  Neck: Significant elevation JV seen at the angle of the jaw.  No carotid bruits, or masses Cardiac: IIRR; no murmurs, rubs, or gallops.  2-3+ edema feet 2 shins bilaterally. Respiratory:  clear to auscultation bilaterally, normal work of breathing GI: soft, nontender, nondistended, + BS MS: no deformity or atrophy  Skin: warm and dry, no rash Neuro:  Alert and Oriented x 3, Strength and sensation are intact  Psych: euthymic mood, full affect  Wt Readings from Last 3 Encounters:  01/31/17 267 lb 9.6 oz (121.4 kg)  01/19/17 263 lb 6.4 oz (119.5 kg)  11/02/16 259 lb (117.5 kg)      Studies/Labs Reviewed:   EKG:  EKG atrial fibrillation, nonspecific T wave flattening, poor R wave progression, compared to prior significant changes noted.  Recent Labs: 01/19/2017: ALT 13; BUN 28.4; Creatinine 1.2; HGB 12.0; Platelets 54; Potassium 4.8; Sodium 140   Lipid Panel    Component Value Date/Time   CHOL 116 08/21/2016 1038   TRIG 55.0 08/21/2016 1038   HDL 48.10 08/21/2016 1038   CHOLHDL 2 08/21/2016 1038   VLDL 11.0 08/21/2016 1038   LDLCALC 57 08/21/2016 1038    Additional studies/ records that were reviewed today include:  Echocardiogram 2015: Study Conclusions  - Left ventricle: The cavity size was mildly dilated. Wall   thickness was increased in a pattern of mild LVH. Systolic   function was normal. The estimated ejection fraction was in the   range of 50% to 55%. Wall motion was normal; there were no   regional wall motion abnormalities. - Aortic valve: A mechanical prosthesis was present.  Normal venting   of aortic valve is present. Mean gradient (S): 15 mm Hg. Peak   gradient (S): 22 mm Hg. - Mitral valve: Calcified annulus. Mildly thickened leaflets .   There was mild regurgitation. - Left atrium: The atrium was severely dilated.    ASSESSMENT:  1. SOB (shortness of breath)   2. Status post mechanical aortic valve replacement   3. CHRONIC DIASTOLIC HEART FAILURE   4. Chronic atrial fibrillation (HCC)   5. Essential hypertension   6. OSA (obstructive sleep apnea)      PLAN:  In order of problems listed above:  1. Dyspnea is likely related to CHF.  There could be a component of deconditioning.  A BMP will be done to assess volume status from a biochemical standpoint.  If elevated above historical values are greater than 500, it would be appropriate to add additional diuretic therapy. 2. Normally functioning mechanical aortic valve.  Echocardiogram will be done to further assess the valve and also to evaluate both left and right ventricular function. 3. I believe there is a component of right heart failure and significant pulmonary hypertension based on exam.  The 2D Doppler echocardiogram will help resolve this. 4. Unchanged.  Continue chronic anticoagulation therapy for both the valve and stroke prevention. 5. Blood pressure is adequately controlled.  To improve volume status we may need to decrease in intensity antihypertensive agents including or possibly discontinuing amlodipine. 6. He should continue to wear his CPAP.  Clinical follow-up in 6 months.  May be early up.  2D Doppler echocardiogram to assess right heart pulmonary pressures as well as mechanical aortic valve.  BNP is being done to exclude the possibility of decompensated diastolic heart failure as a source of his dyspnea.  May need earlier follow-up of diuretic therapy is suggested.    Medication Adjustments/Labs and Tests Ordered: Current medicines are reviewed at length with the patient today.   Concerns regarding medicines are outlined above.  Medication changes, Labs and Tests ordered today are listed in the Patient Instructions below. Patient Instructions  Medication Instructions:  Your physician recommends that you continue on your current medications as directed. Please refer to the Current Medication list given to you today.  Labwork: CMET, CBC and Pro BNP today  Testing/Procedures: Your physician has requested that you have an echocardiogram. Echocardiography is a painless test that uses sound waves to create images of your heart. It provides your doctor with information about the size and shape of your heart and how well your heart's chambers and valves are working. This procedure takes approximately one hour. There are no restrictions for this procedure.   Follow-Up: Your physician wants you to follow-up in: 6 months with Dr. Tamala Julian.  You will receive a reminder letter in the mail two months in advance. If you don't receive a letter, please call our office to schedule the follow-up appointment.   Any Other Special Instructions Will Be Listed Below (If Applicable).     If you need a refill on your cardiac medications before your next appointment, please call your pharmacy.      Signed, [name], MD  01/31/2017 8:37 AM    [facility] [address], [place], [place]  [address] Phone: [phone]; Fax: [phone]

## 2017-01-31 NOTE — Patient Instructions (Signed)
Medication Instructions:  Your physician recommends that you continue on your current medications as directed. Please refer to the Current Medication list given to you today.  Labwork: CMET, CBC and Pro BNP today  Testing/Procedures: Your physician has requested that you have an echocardiogram. Echocardiography is a painless test that uses sound waves to create images of your heart. It provides your doctor with information about the size and shape of your heart and how well your heart's chambers and valves are working. This procedure takes approximately one hour. There are no restrictions for this procedure.   Follow-Up: Your physician wants you to follow-up in: 6 months with Dr. Tamala Julian.  You will receive a reminder letter in the mail two months in advance. If you don't receive a letter, please call our office to schedule the follow-up appointment.   Any Other Special Instructions Will Be Listed Below (If Applicable).     If you need a refill on your cardiac medications before your next appointment, please call your pharmacy.

## 2017-02-01 ENCOUNTER — Telehealth: Payer: Self-pay | Admitting: Interventional Cardiology

## 2017-02-01 DIAGNOSIS — I5032 Chronic diastolic (congestive) heart failure: Secondary | ICD-10-CM

## 2017-02-01 LAB — COMPREHENSIVE METABOLIC PANEL
A/G RATIO: 1.7 (ref 1.2–2.2)
ALBUMIN: 4.3 g/dL (ref 3.5–4.8)
ALT: 14 IU/L (ref 0–44)
AST: 17 IU/L (ref 0–40)
Alkaline Phosphatase: 163 IU/L — ABNORMAL HIGH (ref 39–117)
BUN / CREAT RATIO: 24 (ref 10–24)
BUN: 31 mg/dL — ABNORMAL HIGH (ref 8–27)
Bilirubin Total: 0.9 mg/dL (ref 0.0–1.2)
CALCIUM: 9.1 mg/dL (ref 8.6–10.2)
CO2: 21 mmol/L (ref 20–29)
Chloride: 104 mmol/L (ref 96–106)
Creatinine, Ser: 1.3 mg/dL — ABNORMAL HIGH (ref 0.76–1.27)
GFR, EST AFRICAN AMERICAN: 60 mL/min/{1.73_m2} (ref >59)
GFR, EST NON AFRICAN AMERICAN: 52 mL/min/{1.73_m2} — AB (ref >59)
GLOBULIN, TOTAL: 2.6 g/dL (ref 1.5–4.5)
Glucose: 106 mg/dL — ABNORMAL HIGH (ref 65–99)
POTASSIUM: 4.9 mmol/L (ref 3.5–5.2)
Sodium: 140 mmol/L (ref 134–144)
TOTAL PROTEIN: 6.9 g/dL (ref 6.0–8.5)

## 2017-02-01 LAB — CBC
HEMATOCRIT: 34.5 % — AB (ref 37.5–51.0)
Hemoglobin: 11.6 g/dL — ABNORMAL LOW (ref 13.0–17.7)
MCH: 36.4 pg — ABNORMAL HIGH (ref 26.6–33.0)
MCHC: 33.6 g/dL (ref 31.5–35.7)
MCV: 108 fL — AB (ref 79–97)
Platelets: 67 10*3/uL — CL (ref 150–379)
RBC: 3.19 x10E6/uL — ABNORMAL LOW (ref 4.14–5.80)
RDW: 15.4 % (ref 12.3–15.4)
WBC: 3.3 10*3/uL — ABNORMAL LOW (ref 3.4–10.8)

## 2017-02-01 LAB — PRO B NATRIURETIC PEPTIDE: NT-Pro BNP: 1118 pg/mL — ABNORMAL HIGH (ref 0–486)

## 2017-02-01 MED ORDER — FUROSEMIDE 40 MG PO TABS: 60.0000 mg | ORAL_TABLET | Freq: Every day | ORAL | 3 refills | Status: DC

## 2017-02-01 NOTE — Telephone Encounter (Signed)
[name] is returning a call. Thanks

## 2017-02-01 NOTE — Telephone Encounter (Signed)
Spoke with [name] and made him aware of lab results and recommendations per Dr. Tamala Julian.  Scheduled labs to be on 11/12, same day as CVRR appt.  Scheduled [name] to be seen by Dr. Tamala Julian on 12/10.  [name] verbalized understanding and was appreciative for call.

## 2017-02-07 ENCOUNTER — Other Ambulatory Visit: Payer: Self-pay

## 2017-02-07 ENCOUNTER — Ambulatory Visit (HOSPITAL_COMMUNITY): Payer: Medicare Other | Attending: Cardiovascular Disease

## 2017-02-07 DIAGNOSIS — R0602 Shortness of breath: Secondary | ICD-10-CM

## 2017-02-07 DIAGNOSIS — I081 Rheumatic disorders of both mitral and tricuspid valves: Secondary | ICD-10-CM | POA: Insufficient documentation

## 2017-02-07 DIAGNOSIS — Z951 Presence of aortocoronary bypass graft: Secondary | ICD-10-CM | POA: Diagnosis not present

## 2017-02-07 DIAGNOSIS — Z952 Presence of prosthetic heart valve: Secondary | ICD-10-CM | POA: Diagnosis not present

## 2017-02-07 DIAGNOSIS — Z9889 Other specified postprocedural states: Secondary | ICD-10-CM | POA: Diagnosis not present

## 2017-02-07 DIAGNOSIS — I482 Chronic atrial fibrillation: Secondary | ICD-10-CM | POA: Diagnosis not present

## 2017-02-07 DIAGNOSIS — I509 Heart failure, unspecified: Secondary | ICD-10-CM | POA: Insufficient documentation

## 2017-02-07 DIAGNOSIS — I110 Hypertensive heart disease with heart failure: Secondary | ICD-10-CM | POA: Insufficient documentation

## 2017-02-12 ENCOUNTER — Other Ambulatory Visit: Payer: Medicare Other | Admitting: *Deleted

## 2017-02-12 ENCOUNTER — Ambulatory Visit (INDEPENDENT_AMBULATORY_CARE_PROVIDER_SITE_OTHER): Payer: Medicare Other | Admitting: *Deleted

## 2017-02-12 DIAGNOSIS — I4891 Unspecified atrial fibrillation: Secondary | ICD-10-CM

## 2017-02-12 DIAGNOSIS — I5032 Chronic diastolic (congestive) heart failure: Secondary | ICD-10-CM | POA: Diagnosis not present

## 2017-02-12 DIAGNOSIS — Z952 Presence of prosthetic heart valve: Secondary | ICD-10-CM | POA: Diagnosis not present

## 2017-02-12 DIAGNOSIS — Z5181 Encounter for therapeutic drug level monitoring: Secondary | ICD-10-CM

## 2017-02-12 LAB — BASIC METABOLIC PANEL
BUN/Creatinine Ratio: 29 — ABNORMAL HIGH (ref 10–24)
BUN: 38 mg/dL — AB (ref 8–27)
CALCIUM: 9.3 mg/dL (ref 8.6–10.2)
CHLORIDE: 106 mmol/L (ref 96–106)
CO2: 22 mmol/L (ref 20–29)
Creatinine, Ser: 1.31 mg/dL — ABNORMAL HIGH (ref 0.76–1.27)
GFR calc non Af Amer: 51 mL/min/{1.73_m2} — ABNORMAL LOW (ref >59)
GFR, EST AFRICAN AMERICAN: 59 mL/min/{1.73_m2} — AB (ref >59)
Glucose: 106 mg/dL — ABNORMAL HIGH (ref 65–99)
Potassium: 4.8 mmol/L (ref 3.5–5.2)
Sodium: 144 mmol/L (ref 134–144)

## 2017-02-12 LAB — PROTIME-INR
INR: 5.4 — ABNORMAL HIGH (ref 0.8–1.2)
Prothrombin Time: 57.1 s — ABNORMAL HIGH (ref 9.1–12.0)

## 2017-02-12 LAB — POCT INR: INR: 6.1

## 2017-02-12 NOTE — Progress Notes (Signed)
02/12/17-INR 5.4 (pt taken to lab for STAT INR) Instructions: Do not take any Coumadin today and No Coumadin tomorrow then start taking 1 tablet daily except 1/2 tablet each Sunday and Thursdays.  Repeat INR in 10 days.

## 2017-02-13 ENCOUNTER — Encounter: Payer: Self-pay | Admitting: Cardiology

## 2017-02-19 ENCOUNTER — Telehealth: Payer: Self-pay | Admitting: *Deleted

## 2017-02-19 DIAGNOSIS — I5032 Chronic diastolic (congestive) heart failure: Secondary | ICD-10-CM

## 2017-02-19 NOTE — Telephone Encounter (Signed)
S/w pt today and went over recommendations per Dr. Tamala Julian from previous call last week with Dr. Thompson Caul nurse Anderson Malta. Pt is agreeable to continue on current Tx plan and will do lab work (bmet) in 2 months. We scheduled bmet to be done 04/11/17 when he comes in to see Dr. Radford Pax for sleep f/u. Pt thanked me for my call. Forwarded to Dr. Alain Marion as well.

## 2017-02-19 NOTE — Telephone Encounter (Signed)
-----   Message from Belva Crome, MD sent at 02/17/2017 11:09 AM EST ----- Let the patient know to continue  Current therpy. Check BMET 2 months. A copy will be sent to [name], [name], MD

## 2017-02-21 ENCOUNTER — Ambulatory Visit (INDEPENDENT_AMBULATORY_CARE_PROVIDER_SITE_OTHER): Payer: Medicare Other | Admitting: *Deleted

## 2017-02-21 ENCOUNTER — Other Ambulatory Visit (INDEPENDENT_AMBULATORY_CARE_PROVIDER_SITE_OTHER): Payer: Medicare Other

## 2017-02-21 DIAGNOSIS — E119 Type 2 diabetes mellitus without complications: Secondary | ICD-10-CM

## 2017-02-21 DIAGNOSIS — Z5181 Encounter for therapeutic drug level monitoring: Secondary | ICD-10-CM

## 2017-02-21 DIAGNOSIS — I4891 Unspecified atrial fibrillation: Secondary | ICD-10-CM

## 2017-02-21 DIAGNOSIS — Z952 Presence of prosthetic heart valve: Secondary | ICD-10-CM | POA: Diagnosis not present

## 2017-02-21 LAB — BASIC METABOLIC PANEL
BUN: 43 mg/dL — AB (ref 6–23)
CALCIUM: 9.4 mg/dL (ref 8.4–10.5)
CO2: 26 mEq/L (ref 19–32)
CREATININE: 1.45 mg/dL (ref 0.40–1.50)
Chloride: 105 mEq/L (ref 96–112)
GFR: 49.79 mL/min — AB (ref >60.00)
Glucose, Bld: 132 mg/dL — ABNORMAL HIGH (ref 70–99)
Potassium: 5 mEq/L (ref 3.5–5.1)
Sodium: 138 mEq/L (ref 135–145)

## 2017-02-21 LAB — MICROALBUMIN / CREATININE URINE RATIO
Creatinine,U: 124.3 mg/dL
Microalb Creat Ratio: 33.4 mg/g — ABNORMAL HIGH (ref 0.0–30.0)
Microalb, Ur: 41.6 mg/dL — ABNORMAL HIGH (ref 0.0–1.9)

## 2017-02-21 LAB — HEMOGLOBIN A1C: HEMOGLOBIN A1C: 5.6 % (ref 4.6–6.5)

## 2017-02-21 LAB — POCT INR: INR: 3.7

## 2017-02-21 NOTE — Patient Instructions (Signed)
Today take 1/2 tablet then start taking 1 tablet daily except 1/2 tablet each Sundays, Wednesdays, and Fridays.  Repeat INR in 2 weeks.  Call with any new medications or procedures [phone]

## 2017-02-25 ENCOUNTER — Encounter: Payer: Self-pay | Admitting: Endocrinology

## 2017-02-25 NOTE — Progress Notes (Deleted)
Patient ID: [name], male   DOB: 1935-12-20, 81 y.o.   MRN: [id number]   Reason for Appointment: Diabetes follow-up   History of Present Illness   Diagnosis: Type 2 DIABETES MELITUS date of onset: 07/2010       Oral hypoglycemic drugs: metformin 2250 mg daily       Side effects from medications: None  He was started on metformin when his baseline A1c was 6.9% and he had significant obesity He was also sent for diabetes education. Since then A1c had been consistently upper normal  Recent history:  Oral hypoglycemic drugs: Metformin ER 750 mg, 3 daily  His A1c again is excellent at 5.7, previously ranging from 5.4 up to 5.8%  He has difficulty losing weight which appears to be fluctuating However he thinks he has lost weight compared to last year and is trying to be a little more active He thinks he is fairly consistent with diet Although he has been on 2.25 g of metformin for the last few years on questioning he says that about once or twice a month he will have a loose stools or a little cramping in his abdomen His blood sugars appear to be lower although he is not using the Verio meter He did not have any low sugar symptoms when blood sugar was 61 after supper   Monitors blood glucose:  every other day  Glucose RANGE 61-109 with MEDIAN 93, does some readings at various times Lab glucose 102 on fasting  Meals: breakfast:occasional cereal otherwise muffin and peanut butter  Physical activity: exercise: walking more recently             Dietician visit: Most recent:2012          Last eye exam: annual    Wt Readings from Last 3 Encounters:  01/31/17 267 lb 9.6 oz (121.4 kg)  01/19/17 263 lb 6.4 oz (119.5 kg)  11/02/16 259 lb (117.5 kg)   Lab Results  Component Value Date   HGBA1C 5.6 02/21/2017   HGBA1C 5.7 08/21/2016   HGBA1C 5.4 02/11/2016   Lab Results  Component Value Date   MICROALBUR 41.6 (H) 02/21/2017   LDLCALC 57 08/21/2016   CREATININE 1.45  02/21/2017    LABS:  Anti-coag visit on 02/21/2017  Component Date Value Ref Range Status  . INR 02/21/2017 3.7   Final  Lab on 02/21/2017  Component Date Value Ref Range Status  . Sodium 02/21/2017 138  135 - 145 mEq/L Final  . Potassium 02/21/2017 5.0  3.5 - 5.1 mEq/L Final  . Chloride 02/21/2017 105  96 - 112 mEq/L Final  . CO2 02/21/2017 26  19 - 32 mEq/L Final  . Glucose, Bld 02/21/2017 132* 70 - 99 mg/dL Final  . BUN 02/21/2017 43* 6 - 23 mg/dL Final  . Creatinine, Ser 02/21/2017 1.45  0.40 - 1.50 mg/dL Final  . Calcium 02/21/2017 9.4  8.4 - 10.5 mg/dL Final  . GFR 02/21/2017 49.79* >60.00 mL/min Final  . Hgb A1c MFr Bld 02/21/2017 5.6  4.6 - 6.5 % Final   Glycemic Control Guidelines for People with Diabetes:Non Diabetic:  <6%Goal of Therapy: <7%Additional Action Suggested:  >8%   . Microalb, Ur 02/21/2017 41.6* 0.0 - 1.9 mg/dL Final  . Creatinine,U 02/21/2017 124.3  mg/dL Final  . Microalb Creat Ratio 02/21/2017 33.4* 0.0 - 30.0 mg/g Final    Allergies as of 02/26/2017      Reactions   Rifampin    REACTION: rash (  unclear if due to vanco or rifampin)   Vancomycin    REACTION: rash (unclear if due to rifampin or vanco)      Medication List        Accurate as of 02/25/17  9:04 PM. Always use your most recent med list.          carvedilol 25 MG tablet Commonly known as:  COREG Take 1 tablet (25 mg total) by mouth 2 (two) times daily with a meal.   enalapril 20 MG tablet Commonly known as:  VASOTEC TAKE 1 TABLET BY MOUTH EVERY DAY   ezetimibe-simvastatin 10-20 MG tablet Commonly known as:  VYTORIN Take 1 tablet by mouth at bedtime.   fluocinonide cream 0.05 % Commonly known as:  LIDEX Apply 1 application topically 2 (two) times daily.   folic acid 1 MG tablet Commonly known as:  FOLVITE Take 1 tablet (1 mg total) by mouth daily.   furosemide 40 MG tablet Commonly known as:  LASIX Take 1.5 tablets (60 mg total) by mouth daily.   glucose blood test  strip Commonly known as:  ONE TOUCH ULTRA TEST USE AS DIRECTED TO TEST BLOOD GLUCOSE  EVERY OTHER DAY DX: 250.00   metFORMIN 750 MG 24 hr tablet Commonly known as:  GLUCOPHAGE-XR Take 2 tablets (1,500 mg total) by mouth daily.   NON FORMULARY BIPAP   NON FORMULARY ONE TOUCH ULTRA TEST STRIPS  AND LANCETS   ONETOUCH DELICA LANCETS FINE Misc Use to obtain a blood specimen every other day Dx code 250.00   PROAIR HFA 108 (90 Base) MCG/ACT inhaler Generic drug:  albuterol Inhale 2 puffs into the lungs every 6 (six) hours as needed. For chest congestion   VASCULERA Tabs Take 1 tablet by mouth daily.   Vitamin B12 3000 MCG/ML Liqd Place 2,000 mcg under the tongue daily.   warfarin 5 MG tablet Commonly known as:  COUMADIN Take as directed by the anticoagulation clinic. If you are unsure how to take this medication, talk to your nurse or doctor. Original instructions:  TAKE AS DIRECTED BY COUMADIN CLINIC   warfarin 5 MG tablet Commonly known as:  COUMADIN Take as directed by the anticoagulation clinic. If you are unsure how to take this medication, talk to your nurse or doctor. Original instructions:  TAKE AS DIRECTED BY COUMADIN CLINIC       Allergies:  Allergies  Allergen Reactions  . Rifampin     REACTION: rash (unclear if due to vanco or rifampin)  . Vancomycin     REACTION: rash (unclear if due to rifampin or vanco)    Past Medical History:  Diagnosis Date  . Aortic stenosis   . Carotid artery disease ([place]) 1994   s/p left carotid endarerectomy   . CHF with unknown LVEF ([facility])    47 %  . Chronic atrial fibrillation ([place])   . Hx of CABG   . Hypertension   . Left ventricular dysfunction 2007   LVEF 47%   . OSA (obstructive sleep apnea)   . Rheumatic fever   . Subclavian bypass stenosis ([place])   . Vitamin B12 deficiency     Past Surgical History:  Procedure Laterality Date  . [place]   replaced due to aortic stenosis, St. Jude  mechanical prostesis  . CAROTID ENDARTERECTOMY Left 1997   subclavian bypass Done in [place]  . CORONARY ARTERY BYPASS GRAFT  1994   w SVG to RCA and SVG to circumflex  .  heart bypass     Done in [place]    Family History  Problem Relation Age of Onset  . Cancer Father        lung   . Hypertension Mother     Social History:  reports that he quit smoking about 24 years ago. His smoking use included cigarettes. He has a 30.00 pack-year smoking history. he has never used smokeless tobacco. He reports that he drinks alcohol. He reports that he does not use drugs.  Review of Systems    HYPERTENSION:  he is Enalapril, prescribed by his cardiologist No lightheadedness  BP Readings from Last 3 Encounters:  01/31/17 110/60  01/19/17 (!) 112/49  11/02/16 128/68     Lab Results  Component Value Date   CREATININE 1.45 02/21/2017   BUN 43 (H) 02/21/2017   NA 138 02/21/2017   K 5.0 02/21/2017   CL 105 02/21/2017   CO2 26 02/21/2017     HYPERLIPIDEMIA:         The lipid abnormality consists of elevated LDL treated with Vytorin and followed by PCP    Lab Results  Component Value Date   CHOL 116 08/21/2016   HDL 48.10 08/21/2016   LDLCALC 57 08/21/2016   TRIG 55.0 08/21/2016   CHOLHDL 2 08/21/2016     Last foot exam in 11/17 showing peripheral vascular disease and only mild neuropathy   Examination:   There were no vitals taken for this visit.  There is no height or weight on file to calculate BMI.    Assesment/PLAN:   Diabetes type 2, Mild and associated with obesity   The patient's diabetes is overall well controlled with normal A1c again in the normal range, Improved at 5.4  He has consistent control of his diabetes since his diagnosis in 2012 with maximum dose metformin alone Diet is usually fairly good His weight is variable but overall somewhat better recently He is trying to do a little more activity with walking Since his blood sugars are mostly near  normal including postprandial he can reduce his metformin to 1500 mg a day; he does appear to have occasional loose stools with this  HYPERTENSION: He will follow-up with PCP and cardiologist  Follow-up in 6 months  Marnette Perkins 02/25/2017, 9:04 PM   Note: This office note was prepared with Estate agent. Any transcriptional errors that result from this process are unintentional.

## 2017-02-26 ENCOUNTER — Ambulatory Visit: Payer: Medicare Other | Admitting: Endocrinology

## 2017-03-01 ENCOUNTER — Other Ambulatory Visit: Payer: Self-pay | Admitting: Interventional Cardiology

## 2017-03-01 MED ORDER — ENALAPRIL MALEATE 20 MG PO TABS: 20.0000 mg | ORAL_TABLET | Freq: Every day | ORAL | 3 refills | Status: DC

## 2017-03-06 ENCOUNTER — Ambulatory Visit (INDEPENDENT_AMBULATORY_CARE_PROVIDER_SITE_OTHER): Payer: Medicare Other

## 2017-03-06 DIAGNOSIS — Z5181 Encounter for therapeutic drug level monitoring: Secondary | ICD-10-CM

## 2017-03-06 DIAGNOSIS — Z952 Presence of prosthetic heart valve: Secondary | ICD-10-CM | POA: Diagnosis not present

## 2017-03-06 DIAGNOSIS — I4891 Unspecified atrial fibrillation: Secondary | ICD-10-CM | POA: Diagnosis not present

## 2017-03-06 LAB — POCT INR: INR: 3.5

## 2017-03-06 NOTE — Patient Instructions (Signed)
Continue on same dosage 1 tablet daily except 1/2 tablet each Sundays, Wednesdays, and Fridays.  Repeat INR in 3 weeks.  Call with any new medications or procedures [phone]

## 2017-03-09 ENCOUNTER — Ambulatory Visit (INDEPENDENT_AMBULATORY_CARE_PROVIDER_SITE_OTHER): Payer: Medicare Other | Admitting: Family

## 2017-03-09 ENCOUNTER — Encounter: Payer: Self-pay | Admitting: Family

## 2017-03-09 ENCOUNTER — Ambulatory Visit (HOSPITAL_COMMUNITY)
Admission: RE | Admit: 2017-03-09 | Discharge: 2017-03-09 | Disposition: A | Discharge status: Home / Self Care | Payer: Medicare Other | Source: Ambulatory Visit | Attending: Family | Admitting: Family

## 2017-03-09 VITALS — BP 110/66 | HR 64 | Temp 97.1°F | Resp 19 | Wt 262.5 lb

## 2017-03-09 DIAGNOSIS — E119 Type 2 diabetes mellitus without complications: Secondary | ICD-10-CM | POA: Diagnosis not present

## 2017-03-09 DIAGNOSIS — Z9889 Other specified postprocedural states: Secondary | ICD-10-CM | POA: Diagnosis not present

## 2017-03-09 DIAGNOSIS — R9389 Abnormal findings on diagnostic imaging of other specified body structures: Secondary | ICD-10-CM | POA: Insufficient documentation

## 2017-03-09 DIAGNOSIS — Z87891 Personal history of nicotine dependence: Secondary | ICD-10-CM | POA: Diagnosis not present

## 2017-03-09 DIAGNOSIS — I771 Stricture of artery: Secondary | ICD-10-CM

## 2017-03-09 DIAGNOSIS — I872 Venous insufficiency (chronic) (peripheral): Secondary | ICD-10-CM

## 2017-03-09 DIAGNOSIS — I10 Essential (primary) hypertension: Secondary | ICD-10-CM | POA: Diagnosis not present

## 2017-03-09 DIAGNOSIS — E785 Hyperlipidemia, unspecified: Secondary | ICD-10-CM | POA: Insufficient documentation

## 2017-03-09 DIAGNOSIS — I6523 Occlusion and stenosis of bilateral carotid arteries: Secondary | ICD-10-CM

## 2017-03-09 DIAGNOSIS — R0989 Other specified symptoms and signs involving the circulatory and respiratory systems: Secondary | ICD-10-CM | POA: Diagnosis not present

## 2017-03-09 DIAGNOSIS — I739 Peripheral vascular disease, unspecified: Secondary | ICD-10-CM

## 2017-03-09 NOTE — Patient Instructions (Addendum)
Peripheral Vascular Disease Peripheral vascular disease (PVD) is a disease of the blood vessels that are not part of your heart and brain. A simple term for PVD is poor circulation. In most cases, PVD narrows the blood vessels that carry blood from your heart to the rest of your body. This can result in a decreased supply of blood to your arms, legs, and internal organs, like your stomach or kidneys. However, it most often affects a person's lower legs and feet. There are two types of PVD.  Organic PVD. This is the more common type. It is caused by damage to the structure of blood vessels.  Functional PVD. This is caused by conditions that make blood vessels contract and tighten (spasm).  Without treatment, PVD tends to get worse over time. PVD can also lead to acute ischemic limb. This is when an arm or limb suddenly has trouble getting enough blood. This is a medical emergency. Follow these instructions at home:  Take medicines only as told by your doctor.  Do not use any tobacco products, including cigarettes, chewing tobacco, or electronic cigarettes. If you need help quitting, ask your doctor.  Lose weight if you are overweight, and maintain a healthy weight as told by your doctor.  Eat a diet that is low in fat and cholesterol. If you need help, ask your doctor.  Exercise regularly. Ask your doctor for some good activities for you.  Take good care of your feet. ? Wear comfortable shoes that fit well. ? Check your feet often for any cuts or sores. Contact a doctor if:  You have cramps in your legs while walking.  You have leg pain when you are at rest.  You have coldness in a leg or foot.  Your skin changes.  You are unable to get or have an erection (erectile dysfunction).  You have cuts or sores on your feet that are not healing. Get help right away if:  Your arm or leg turns cold and blue.  Your arms or legs become red, warm, swollen, painful, or numb.  You have  chest pain or trouble breathing.  You suddenly have weakness in your face, arm, or leg.  You become very confused or you cannot speak.  You suddenly have a very bad headache.  You suddenly cannot see. This information is not intended to replace advice given to you by your health care provider. Make sure you discuss any questions you have with your health care provider. Document Released: 06/14/2009 Document Revised: 08/26/2015 Document Reviewed: 08/28/2013 Elsevier Interactive Patient Education  2017 Elsevier Inc.     Chronic Venous Insufficiency Chronic venous insufficiency, also called venous stasis, is a condition that prevents blood from being pumped effectively through the veins in your legs. Blood may no longer be pumped effectively from the legs back to the heart. This condition can range from mild to severe. With proper treatment, you should be able to continue with an active life. What are the causes? Chronic venous insufficiency occurs when the vein walls become stretched, weakened, or damaged, or when valves within the vein are damaged. Some common causes of this include:  High blood pressure inside the veins (venous hypertension).  Increased blood pressure in the leg veins from long periods of sitting or standing.  A blood clot that blocks blood flow in a vein (deep vein thrombosis, DVT).  Inflammation of a vein (phlebitis) that causes a blood clot to form.  Tumors in the pelvis that cause blood to  back up.  What increases the risk? The following factors may make you more likely to develop this condition:  Having a family history of this condition.  Obesity.  Pregnancy.  Living without enough physical activity or exercise (sedentary lifestyle).  Smoking.  Having a job that requires long periods of standing or sitting in one place.  Being a certain age. Women in their 20s and 55s and men in their 79s are more likely to develop this condition.  What are the  signs or symptoms? Symptoms of this condition include:  Veins that are enlarged, bulging, or twisted (varicose veins).  Skin breakdown or ulcers.  Reddened or discolored skin on the front of the leg.  Brown, smooth, tight, and painful skin just above the ankle, usually on the inside of the leg (lipodermatosclerosis).  Swelling.  How is this diagnosed? This condition may be diagnosed based on:  Your medical history.  A physical exam.  Tests, such as: ? A procedure that creates an image of a blood vessel and nearby organs and provides information about blood flow through the blood vessel (duplex ultrasound). ? A procedure that tests blood flow (plethysmography). ? A procedure to look at the veins using X-ray and dye (venogram).  How is this treated? The goals of treatment are to help you return to an active life and to minimize pain or disability. Treatment depends on the severity of your condition, and it may include:  Wearing compression stockings. These can help relieve symptoms and help prevent your condition from getting worse. However, they do not cure the condition.  Sclerotherapy. This is a procedure involving an injection of a material that "dissolves" damaged veins.  Surgery. This may involve: ? Removing a diseased vein (vein stripping). ? Cutting off blood flow through the vein (laser ablation surgery). ? Repairing a valve.  Follow these instructions at home:  Wear compression stockings as told by your health care provider. These stockings help to prevent blood clots and reduce swelling in your legs.  Take over-the-counter and prescription medicines only as told by your health care provider.  Stay active by exercising, walking, or doing different activities. Ask your health care provider what activities are safe for you and how much exercise you need.  Drink enough fluid to keep your urine clear or pale yellow.  Do not use any products that contain nicotine or  tobacco, such as cigarettes and e-cigarettes. If you need help quitting, ask your health care provider.  Keep all follow-up visits as told by your health care provider. This is important. Contact a health care provider if:  You have redness, swelling, or more pain in the affected area.  You see a red streak or line that extends up or down from the affected area.  You have skin breakdown or a loss of skin in the affected area, even if the breakdown is small.  You get an injury in the affected area. Get help right away if:  You get an injury and an open wound in the affected area.  You have severe pain that does not get better with medicine.  You have sudden numbness or weakness in the foot or ankle below the affected area, or you have trouble moving your foot or ankle.  You have a fever and you have worse or persistent symptoms.  You have chest pain.  You have shortness of breath. Summary  Chronic venous insufficiency, also called venous stasis, is a condition that prevents blood from being pumped  effectively through the veins in your legs.  Chronic venous insufficiency occurs when the vein walls become stretched, weakened, or damaged, or when valves within the vein are damaged.  Treatment for this condition depends on how severe your condition is, and it may involve wearing compression stockings or having a procedure.  Make sure you stay active by exercising, walking, or doing different activities. Ask your health care provider what activities are safe for you and how much exercise you need. This information is not intended to replace advice given to you by your health care provider. Make sure you discuss any questions you have with your health care provider. Document Released: 07/24/2006 Document Revised: 02/07/2016 Document Reviewed: 02/07/2016 Elsevier Interactive Patient Education  2017 Elsevier Inc.    To measure for knee high compression hose: Measure the length of  calf (from the crease of the knee to the bottom of the heel), largest circumference of calf, and ankle circumference first thing in the morning before your legs have a chance to swell.  Take these 3 measurements with you to obtain 20-30 mm mercury graduated knee high compression hose.  Put the stockings on in the morning, remove at bedtime.

## 2017-03-09 NOTE — Progress Notes (Signed)
[facility] [place]   CC: Follow up peripheral artery occlusive disease  History of Present Illness [name] is a 81 y.o. male who initially presented to Dr. Bridgett Larsson with chief complaint: right thigh cramping. The patient's symptoms are completely stable. The patient's symptoms were: right thigh cramping and fatigue extending to calf with >1 block walking. The patient's treatment regimen currently includes: maximal medical management and walking plan.  Prior exercise ABI verifying low grade iliac disease on R side.  Currently his right buttock aches after walking about 100 feet, relieved by rest; he denies any sx's in his left leg.   He reports a left subclavian artery bypass and left CEA in 1997 in [place]; he moved to [place] in 2005.   Pt denies any hx of stroke or TIA.  He states he had 2 MI's in early 1994, had 2 vessels CABG in 1994.  He has intentionally cut back his portions and lost weight, states he feels better.  He denies feeling light headed.   Pt sees a hematologist for low platelets.   Pt Diabetic: Yes, last A1C was 5.6 on 02-21-17 (review of records) Pt smoker: former smoker, quit in 1994, smoked x 30 years  Pt meds include: Statin :Yes Betablocker: Yes ASA: No Other anticoagulants/antiplatelets: coumadin, has atrial fib and hx of aortic valve replacement    Past Medical History:  Diagnosis Date  . Aortic stenosis   . Carotid artery disease ([place]) 1994   s/p left carotid endarerectomy   . CHF with unknown LVEF ([facility])    47 %  . Chronic atrial fibrillation ([place])   . Hx of CABG   . Hypertension   . Left ventricular dysfunction 2007   LVEF 47%   . OSA (obstructive sleep apnea)   . Rheumatic fever   . Subclavian bypass stenosis ([place])   . Vitamin B12 deficiency     Social History Social History   Tobacco Use  . Smoking status: Former Smoker    Packs/day: 1.00    Years: 30.00    Pack years: 30.00    Types:  Cigarettes    Last attempt to quit: 04/03/1992    Years since quitting: 24.9  . Smokeless tobacco: Never Used  Substance Use Topics  . Alcohol use: Yes    Comment: 4-6 ounces of wine daily  . Drug use: No    Family History Family History  Problem Relation Age of Onset  . Cancer Father        lung   . Hypertension Mother     Past Surgical History:  Procedure Laterality Date  . [place]   replaced due to aortic stenosis, St. Jude mechanical prostesis  . CAROTID ENDARTERECTOMY Left 1997   subclavian bypass Done in [place]  . CORONARY ARTERY BYPASS GRAFT  1994   w SVG to RCA and SVG to circumflex  . heart bypass     Done in [place]    Allergies  Allergen Reactions  . Rifampin     REACTION: rash (unclear if due to vanco or rifampin)  . Vancomycin     REACTION: rash (unclear if due to rifampin or vanco)    Current Outpatient Medications  Medication Sig Dispense Refill  . carvedilol (COREG) 25 MG tablet Take 1 tablet (25 mg total) by mouth 2 (two) times daily with a meal. 180 tablet 3  . Cyanocobalamin (VITAMIN B12) 3000 MCG/ML LIQD Place 2,000 mcg under the tongue daily.    Marland Kitchen  Dietary Management Product (VASCULERA) TABS Take 1 tablet by mouth daily. 90 tablet 3  . enalapril (VASOTEC) 20 MG tablet Take 1 tablet (20 mg total) by mouth daily. 90 tablet 3  . ezetimibe-simvastatin (VYTORIN) 10-20 MG per tablet Take 1 tablet by mouth at bedtime. 90 tablet 2  . fluocinonide cream (LIDEX) 6.96 % Apply 1 application topically 2 (two) times daily.     . folic acid (FOLVITE) 1 MG tablet Take 1 tablet (1 mg total) by mouth daily.    . furosemide (LASIX) 40 MG tablet Take 1.5 tablets (60 mg total) by mouth daily. 135 tablet 3  . glucose blood (ONE TOUCH ULTRA TEST) test strip USE AS DIRECTED TO TEST BLOOD GLUCOSE  EVERY OTHER DAY DX: 250.00 50 each 2  . metFORMIN (GLUCOPHAGE-XR) 750 MG 24 hr tablet Take 2 tablets (1,500 mg total) by mouth daily. 180 tablet 2  . NON  FORMULARY BIPAP    . NON FORMULARY ONE TOUCH ULTRA TEST STRIPS  AND LANCETS    . ONETOUCH DELICA LANCETS FINE MISC Use to obtain a blood specimen every other day Dx code 250.00 100 each 1  . PROAIR HFA 108 (90 BASE) MCG/ACT inhaler Inhale 2 puffs into the lungs every 6 (six) hours as needed. For chest congestion    . warfarin (COUMADIN) 5 MG tablet TAKE AS DIRECTED BY COUMADIN CLINIC 45 tablet 3   No current facility-administered medications for this visit.     ROS: See HPI for pertinent positives and negatives.   Physical Examination  Vitals:   03/09/17 1151 03/09/17 1154  BP: 92/66 110/66  Pulse: 64   Resp: 19   Temp: (!) 97.1 F (36.2 C)   TempSrc: Oral   SpO2: 95%   Weight: 262 lb 8 oz (119.1 kg)    Body mass index is 33.7 kg/m.  General: A&O x 3, WDWN, obese male  Pulmonary: Sym exp, respirations are non labored, good air movt, CTAB, no rales, rhonchi, or wheezing  Cardiac: Irregular rhythm, controlled rate,  + click, no detected murmur.  Vascular: Vessel Right Left  Radial Palpable Palpable  Brachial Palpable Palpable  Carotid Palpable, without bruit Palpable, without bruit  Aorta Not palpable N/A  Femoral Palpable Palpable  Popliteal Not palpable Not palpable  PT Palpable Palpable  DP Palpable Palpable   Gastrointestinal: soft, NTND, no G/R, no HSM, no palpable masses, no CVAT B  Musculoskeletal: M/S 5/5 throughout , Extremities without ischemic changes. Lower legs with leathery skin changes and hemosiderin deposits secondary to chronic venous stasis; 1+ pitting edema and 2+ non pitting edema in both lower legs and feet, right is worse than left.   Neurologic: Pain and light touch intact in extremities , Motor exam as listed above     ASSESSMENT: [name] is a 81 y.o. male who presents with mild/moderte right buttock aching after walking about 100 feet, relieved by rest; he denies any sx's in his left leg. Prior  exercise ABI verifying low grade iliac disease on R side. He has a hx of a left subclavian artery bypass and left CEA in 1997 in [place], no hx of stroke or TIA.   His ABI's and TBI's are normal with bi and triphasic waveforms. There are no signs of ischemia in his feet or legs.    At this point the patient feels his sx are NOT lifestyle limiting, so Dr. Bridgett Larsson would hold off on endovascular intervention.  If his sx increase, Dr. Bridgett Larsson documented at  pt's 03-03-15 visit that he would consider: Aortogram, bilateral runoff, and possible right iliac artery intervention.   DATA  ABI (Date: 03/09/2017):  R:   ABI: 1.22 (was 1.22 on 03-03-16),   PT: bi  DP: tri  TBI:  0.64  L:   ABI: 1.22 (was 1.09),   PT: tri  DP: tri  TBI: 0.67  Stable and normal bilateral ABI.    PLAN:  Graduated walking program discussed and how to achieve.   Chronic venous insufficiency: Knee high graduated compression hose, elevation of legs when not walking, see pt information.   Based on the patient's vascular studies and examination, pt will return to clinic in 1 year with ABI's and carotid duplex.   I discussed in depth with the patient the nature of atherosclerosis, and emphasized the importance of maximal medical management including strict control of blood pressure, blood glucose, and lipid levels, obtaining regular exercise, and continued cessation of smoking.  The patient is aware that without maximal medical management the underlying atherosclerotic disease process will progress, limiting the benefit of any interventions.  The patient was given information about PAD including signs, symptoms, treatment, what symptoms should prompt the patient to seek immediate medical care, and risk reduction measures to take.  Clemon Chambers, RN, MSN, FNP-C [facility] Phone: [phone]  Clinic MD: Cain/Chen  03/09/17 12:16 PM

## 2017-03-12 ENCOUNTER — Ambulatory Visit: Payer: Medicare Other | Admitting: Interventional Cardiology

## 2017-03-15 ENCOUNTER — Ambulatory Visit (INDEPENDENT_AMBULATORY_CARE_PROVIDER_SITE_OTHER): Payer: Medicare Other | Admitting: Physician Assistant

## 2017-03-15 ENCOUNTER — Encounter: Payer: Self-pay | Admitting: Physician Assistant

## 2017-03-15 VITALS — BP 104/60 | HR 64 | Ht 73.5 in | Wt 264.8 lb

## 2017-03-15 DIAGNOSIS — Z952 Presence of prosthetic heart valve: Secondary | ICD-10-CM

## 2017-03-15 DIAGNOSIS — I10 Essential (primary) hypertension: Secondary | ICD-10-CM

## 2017-03-15 DIAGNOSIS — I2510 Atherosclerotic heart disease of native coronary artery without angina pectoris: Secondary | ICD-10-CM | POA: Diagnosis not present

## 2017-03-15 DIAGNOSIS — I482 Chronic atrial fibrillation, unspecified: Secondary | ICD-10-CM

## 2017-03-15 DIAGNOSIS — I70219 Atherosclerosis of native arteries of extremities with intermittent claudication, unspecified extremity: Secondary | ICD-10-CM | POA: Diagnosis not present

## 2017-03-15 DIAGNOSIS — I5032 Chronic diastolic (congestive) heart failure: Secondary | ICD-10-CM

## 2017-03-15 DIAGNOSIS — I6523 Occlusion and stenosis of bilateral carotid arteries: Secondary | ICD-10-CM

## 2017-03-15 LAB — BASIC METABOLIC PANEL
BUN / CREAT RATIO: 30 — AB (ref 10–24)
BUN: 38 mg/dL — AB (ref 8–27)
CHLORIDE: 106 mmol/L (ref 96–106)
CO2: 23 mmol/L (ref 20–29)
CREATININE: 1.27 mg/dL (ref 0.76–1.27)
Calcium: 9.2 mg/dL (ref 8.6–10.2)
GFR, EST AFRICAN AMERICAN: 62 mL/min/{1.73_m2} (ref >59)
GFR, EST NON AFRICAN AMERICAN: 53 mL/min/{1.73_m2} — AB (ref >59)
Glucose: 100 mg/dL — ABNORMAL HIGH (ref 65–99)
Potassium: 4.7 mmol/L (ref 3.5–5.2)
Sodium: 143 mmol/L (ref 134–144)

## 2017-03-15 LAB — PRO B NATRIURETIC PEPTIDE: NT-Pro BNP: 1655 pg/mL — ABNORMAL HIGH (ref 0–486)

## 2017-03-15 MED ORDER — TORSEMIDE 20 MG PO TABS: 40.0000 mg | ORAL_TABLET | Freq: Every day | ORAL | 3 refills | Status: DC

## 2017-03-15 MED ORDER — AMPICILLIN 500 MG PO CAPS: ORAL_CAPSULE | ORAL | 0 refills | Status: DC

## 2017-03-15 NOTE — Progress Notes (Signed)
Cardiology Office Note    Date:  03/15/2017   ID:  TRUE [name], DOB November 03, 1935, MRN [id number]  PCP:  Cassandria Anger, MD  Cardiologist: Dr. Tamala Julian  Chief Complaint  Patient presents with  . Follow-up    History of Present Illness:  [name] is a 81 y.o. male with history of CAD status post CABG mechanical AVR, chronic atrial fibrillation on Coumadin, chronic combined systolic and diastolic CHF, hypertension, OSA.  Last saw Dr. Tamala Julian 01/2017 and he was complaining of dyspnea.  Was felt likely related to CHF but also a component of deconditioning.  BNP was over 1000 and Lasix was increased to 60 mg daily.  2D echo 02/07/17 normal LVEF 55-60% with normal-appearing mechanical AVR.  Baseline creatinine about 1.3 went up to1.45. Patient also recently had buttocks claudication but had normal ABIs and is going to be followed regularly by Dr. Bridgett Larsson.  Patient comes in today for follow-up.  He says his breathing is marginally better.  He got short of breath taking his garbage out this morning.  He denied any chest tightness or pressure like he had prior to his CABG.  He is lost 3 pounds but still has quite a bit of edema.  His wife does cook with more salt than she should.  His O2 sat was 92% when he came in.  CT scan 2010 showed mild interstitial lung disease.  He is also asking about antibiotic prophylaxis for he needs a tooth pulled.    Past Medical History:  Diagnosis Date  . Aortic stenosis   . Carotid artery disease ([place]) 1994   s/p left carotid endarerectomy   . CHF with unknown LVEF (HCC)    47 %  . Chronic atrial fibrillation ([place])   . Hx of CABG   . Hypertension   . Left ventricular dysfunction 2007   LVEF 47%   . OSA (obstructive sleep apnea)   . Rheumatic fever   . Subclavian bypass stenosis ([place])   . Vitamin B12 deficiency     Past Surgical History:  Procedure Laterality Date  . [place]   replaced due to aortic stenosis, [employer] mechanical  prostesis  . CAROTID ENDARTERECTOMY Left 1997   subclavian bypass Done in [place]  . CORONARY ARTERY BYPASS GRAFT  1994   w SVG to RCA and SVG to circumflex  . heart bypass     Done in [place]    Current Medications: Current Meds  Medication Sig  . carvedilol (COREG) 25 MG tablet Take 1 tablet (25 mg total) by mouth 2 (two) times daily with a meal.  . Cyanocobalamin (VITAMIN B12) 3000 MCG/ML LIQD Place 2,000 mcg under the tongue daily.  . Dietary Management Product (VASCULERA) TABS Take 1 tablet by mouth daily.  . enalapril (VASOTEC) 20 MG tablet Take 1 tablet (20 mg total) by mouth daily.  Marland Kitchen ezetimibe-simvastatin (VYTORIN) 10-20 MG per tablet Take 1 tablet by mouth at bedtime.  . fluocinonide cream (LIDEX) 3.57 % Apply 1 application topically 2 (two) times daily.   . folic acid (FOLVITE) 1 MG tablet Take 1 tablet (1 mg total) by mouth daily.  . furosemide (LASIX) 40 MG tablet Take 1.5 tablets (60 mg total) by mouth daily.  Marland Kitchen glucose blood (ONE TOUCH ULTRA TEST) test strip USE AS DIRECTED TO TEST BLOOD GLUCOSE  EVERY OTHER DAY DX: 250.00  . metFORMIN (GLUCOPHAGE-XR) 750 MG 24 hr tablet Take 2 tablets (1,500 mg total) by mouth daily.  Marland Kitchen  NON FORMULARY BIPAP  . NON FORMULARY ONE TOUCH ULTRA TEST STRIPS  AND LANCETS  . ONETOUCH DELICA LANCETS FINE MISC Use to obtain a blood specimen every other day Dx code 250.00  . PROAIR HFA 108 (90 BASE) MCG/ACT inhaler Inhale 2 puffs into the lungs every 6 (six) hours as needed. For chest congestion  . warfarin (COUMADIN) 5 MG tablet TAKE AS DIRECTED BY COUMADIN CLINIC     Allergies:   Rifampin and Vancomycin   Social History   Socioeconomic History  . Marital status: Married    Spouse name: None  . Number of children: None  . Years of education: None  . Highest education level: None  Social Needs  . Financial resource strain: None  . Food insecurity - worry: None  . Food insecurity - inability: None  . Transportation needs - medical:  None  . Transportation needs - non-medical: None  Occupational History  . None  Tobacco Use  . Smoking status: Former Smoker    Packs/day: 1.00    Years: 30.00    Pack years: 30.00    Types: Cigarettes    Last attempt to quit: 04/03/1992    Years since quitting: 24.9  . Smokeless tobacco: Never Used  Substance and Sexual Activity  . Alcohol use: Yes    Comment: 4-6 ounces of wine daily  . Drug use: No  . Sexual activity: None  Other Topics Concern  . None  Social History Narrative  . None     Family History:  The patient's family history includes Cancer in his father; Hypertension in his mother.   ROS:   Please see the history of present illness.    Review of Systems  Constitution: Negative.  HENT: Negative.   Cardiovascular: Positive for claudication, dyspnea on exertion, irregular heartbeat and leg swelling.  Respiratory: Negative.   Endocrine: Negative.   Hematologic/Lymphatic: Negative.   Musculoskeletal: Negative.   Gastrointestinal: Negative.   Genitourinary: Negative.   Neurological: Negative.    All other systems reviewed and are negative.   PHYSICAL EXAM:   VS:  Ht 6' 1.5" (1.867 m)   Wt 264 lb 12.8 oz (120.1 kg)   BMI 34.46 kg/m   Physical Exam  GEN: Well nourished, well developed, in no acute distress  Neck: Slight increase JVD, no carotid bruits, or masses Cardiac: Irregular irregular with crisp valve Respiratory: Decreased breath sounds without rales GI: soft, nontender, nondistended, + BS Ext: 3+ brawny edema decreased distal pulses Neuro:  Alert and Oriented x 3 Psych: euthymic mood, full affect  Wt Readings from Last 3 Encounters:  03/15/17 264 lb 12.8 oz (120.1 kg)  03/09/17 262 lb 8 oz (119.1 kg)  01/31/17 267 lb 9.6 oz (121.4 kg)      Studies/Labs Reviewed:   EKG:  EKG is not ordered today.    Recent Labs: 01/31/2017: ALT 14; Hemoglobin 11.6; NT-Pro BNP 1,118; Platelets 67 02/21/2017: BUN 43; Creatinine, Ser 1.45; Potassium 5.0;  Sodium 138   Lipid Panel    Component Value Date/Time   CHOL 116 08/21/2016 1038   TRIG 55.0 08/21/2016 1038   HDL 48.10 08/21/2016 1038   CHOLHDL 2 08/21/2016 1038   VLDL 11.0 08/21/2016 1038   LDLCALC 57 08/21/2016 1038    Additional studies/ records that were reviewed today include:  2D echo 02/07/17  Study Conclusions   - Left ventricle: Wall thickness was increased in a pattern of mild   LVH. Systolic function was normal. The estimated ejection  fraction was in the range of 55% to 60%. - Aortic valve: Normal appearing mechanical AVR with no peri   valvular regurgitation. - Mitral valve: Moderately calcified annulus. Moderately thickened   leaflets . There was moderate regurgitation. - Left atrium: The atrium was severely dilated. - Atrial septum: No defect or patent foramen ovale was identified.   ABI (Date: 03/09/2017):  R:  ? ABI: 1.22 (was 1.22 on 03-03-16),  ? PT: bi ? DP: tri ? TBI:  0.64  L:  ? ABI: 1.22 (was 1.09),  ? PT: tri ? DP: tri ? TBI: 0.67   Stable and normal bilateral ABI.      PLAN:   Graduated walking program discussed and how to achieve.    Chronic venous insufficiency: Knee high graduated compression hose, elevation of legs when not walking, see pt information.    Based on the patient's vascular studies and examination, pt will return to clinic in 1 year with ABI's and carotid duplex.      ASSESSMENT:    1. Atherosclerosis of native coronary artery of native heart without angina pectoris   2. History of mechanical aortic valve replacement   3. CHRONIC DIASTOLIC HEART FAILURE   4. Essential hypertension   5. Atherosclerosis of native artery of lower extremity with intermittent claudication, unspecified laterality ([place])   6. Chronic atrial fibrillation (HCC)      PLAN:  In order of problems listed above:  CAD status post CABG and AVR stable without angina valve functioning normally on recent 2D echo.  Will give ampicillin 2 g  30-60 minutes prior to dental work.  Acute on chronic diastolic CHF Lasix recently increased to 60 mg daily.  Patient is still short of breath and has significant leg edema.  Will change Lasix to torsemide to see if this helps.  Will check renal function and BNP today.  Needs follow-up in 2 weeks be met as well.  2 g sodium diet.  Compression stockings.  CKD creatinine 1.45 on 02/21/17 we will recheck today.  Essential hypertension BP on the low side.  Chronic atrial fibrillation on Coumadin rate controlled.  Claudication symptoms with recent normal ABIs followed by Dr. Bridgett Larsson    Medication Adjustments/Labs and Tests Ordered: Current medicines are reviewed at length with the patient today.  Concerns regarding medicines are outlined above.  Medication changes, Labs and Tests ordered today are listed in the Patient Instructions below. There are no Patient Instructions on file for this visit.   [place] Boast, PA-C  03/15/2017 8:38 AM    [facility] [address], [place], [place]  [address] Phone: [phone]; Fax: [phone]

## 2017-03-15 NOTE — Patient Instructions (Addendum)
Medication Instructions:  Your physician has recommended you make the following change in your medication:  1.  STOP the Lasix 2.  START Torsemide 20 mg taking 2 tablets in the a.m. 3.  START Ampicillin 500 mg taking 4 tablets 30-60 minutes prior to your procedure  Labwork: TODAY:  BMET & PRO BNP 2 WEEKS:  BMET  Testing/Procedures: None ordered  Follow-Up: Your physician wants you to follow-up in:  2 [place] WITH A BMET Any Other Special Instructions Will Be Listed Below (If Applicable).   DASH Eating Plan DASH stands for "Dietary Approaches to Stop Hypertension." The DASH eating plan is a healthy eating plan that has been shown to reduce high blood pressure (hypertension). It may also reduce your risk for type 2 diabetes, heart disease, and stroke. The DASH eating plan may also help with weight loss. What are tips for following this plan? General guidelines  Avoid eating more than 2,300 mg (milligrams) of salt (sodium) a day. If you have hypertension, you may need to reduce your sodium intake to 1,500 mg a day.  Limit alcohol intake to no more than 1 drink a day for nonpregnant women and 2 drinks a day for men. One drink equals 12 oz of beer, 5 oz of wine, or 1 oz of hard liquor.  Work with your health care provider to maintain a healthy body weight or to lose weight. Ask what an ideal weight is for you.  Get at least 30 minutes of exercise that causes your heart to beat faster (aerobic exercise) most days of the week. Activities may include walking, swimming, or biking.  Work with your health care provider or diet and nutrition specialist (dietitian) to adjust your eating plan to your individual calorie needs. Reading food labels  Check food labels for the amount of sodium per serving. Choose foods with less than 5 percent of the Daily Value of sodium. Generally, foods with less than 300 mg of sodium per serving fit into this eating plan.  To find  whole grains, look for the word "whole" as the first word in the ingredient list. Shopping  Buy products labeled as "low-sodium" or "no salt added."  Buy fresh foods. Avoid canned foods and premade or frozen meals. Cooking  Avoid adding salt when cooking. Use salt-free seasonings or herbs instead of table salt or sea salt. Check with your health care provider or pharmacist before using salt substitutes.  Do not fry foods. Cook foods using healthy methods such as baking, boiling, grilling, and broiling instead.  Cook with heart-healthy oils, such as olive, canola, soybean, or sunflower oil. Meal planning   Eat a balanced diet that includes: ? 5 or more servings of fruits and vegetables each day. At each meal, try to fill half of your plate with fruits and vegetables. ? Up to 6-8 servings of whole grains each day. ? Less than 6 oz of lean meat, poultry, or fish each day. A 3-oz serving of meat is about the same size as a deck of cards. One egg equals 1 oz. ? 2 servings of low-fat dairy each day. ? A serving of nuts, seeds, or beans 5 times each week. ? Heart-healthy fats. Healthy fats called Omega-3 fatty acids are found in foods such as flaxseeds and coldwater fish, like sardines, salmon, and mackerel.  Limit how much you eat of the following: ? Canned or prepackaged foods. ? Food that is high in trans fat, such as  fried foods. ? Food that is high in saturated fat, such as fatty meat. ? Sweets, desserts, sugary drinks, and other foods with added sugar. ? Full-fat dairy products.  Do not salt foods before eating.  Try to eat at least 2 vegetarian meals each week.  Eat more home-cooked food and less restaurant, buffet, and fast food.  When eating at a restaurant, ask that your food be prepared with less salt or no salt, if possible. What foods are recommended? The items listed may not be a complete list. Talk with your dietitian about what dietary choices are best for  you. Grains Whole-grain or whole-wheat bread. Whole-grain or whole-wheat pasta. Brown rice. [place]. Bulgur. Whole-grain and low-sodium cereals. Pita bread. Low-fat, low-sodium crackers. Whole-wheat flour tortillas. Vegetables Fresh or frozen vegetables (raw, steamed, roasted, or grilled). Low-sodium or reduced-sodium tomato and vegetable juice. Low-sodium or reduced-sodium tomato sauce and tomato paste. Low-sodium or reduced-sodium canned vegetables. Fruits All fresh, dried, or frozen fruit. Canned fruit in natural juice (without added sugar). Meat and other protein foods Skinless chicken or [place]. Ground chicken or [place]. Pork with fat trimmed off. Fish and seafood. Egg whites. Dried beans, peas, or lentils. Unsalted nuts, nut butters, and seeds. Unsalted canned beans. Lean cuts of beef with fat trimmed off. Low-sodium, lean deli meat. Dairy Low-fat (1%) or fat-free (skim) milk. Fat-free, low-fat, or reduced-fat cheeses. Nonfat, low-sodium ricotta or cottage cheese. Low-fat or nonfat yogurt. Low-fat, low-sodium cheese. Fats and oils Soft margarine without trans fats. Vegetable oil. Low-fat, reduced-fat, or light mayonnaise and salad dressings (reduced-sodium). Canola, safflower, olive, soybean, and sunflower oils. Avocado. Seasoning and other foods Herbs. Spices. Seasoning mixes without salt. Unsalted popcorn and pretzels. Fat-free sweets. What foods are not recommended? The items listed may not be a complete list. Talk with your dietitian about what dietary choices are best for you. Grains Baked goods made with fat, such as croissants, muffins, or some breads. Dry pasta or rice meal packs. Vegetables Creamed or fried vegetables. Vegetables in a cheese sauce. Regular canned vegetables (not low-sodium or reduced-sodium). Regular canned tomato sauce and paste (not low-sodium or reduced-sodium). Regular tomato and vegetable juice (not low-sodium or reduced-sodium). Angie Fava.  Olives. Fruits Canned fruit in a light or heavy syrup. Fried fruit. Fruit in cream or butter sauce. Meat and other protein foods Fatty cuts of meat. Ribs. Fried meat. Berniece Salines. Sausage. Bologna and other processed lunch meats. Salami. Fatback. Hotdogs. Bratwurst. Salted nuts and seeds. Canned beans with added salt. Canned or smoked fish. Whole eggs or egg yolks. Chicken or [place] with skin. Dairy Whole or 2% milk, cream, and half-and-half. Whole or full-fat cream cheese. Whole-fat or sweetened yogurt. Full-fat cheese. Nondairy creamers. Whipped toppings. Processed cheese and cheese spreads. Fats and oils Butter. Stick margarine. Lard. Shortening. Ghee. Bacon fat. Tropical oils, such as coconut, palm kernel, or palm oil. Seasoning and other foods Salted popcorn and pretzels. Onion salt, garlic salt, seasoned salt, table salt, and sea salt. Worcestershire sauce. Tartar sauce. Barbecue sauce. Teriyaki sauce. Soy sauce, including reduced-sodium. Steak sauce. Canned and packaged gravies. Fish sauce. Oyster sauce. Cocktail sauce. Horseradish that you find on the shelf. Ketchup. Mustard. Meat flavorings and tenderizers. Bouillon cubes. Hot sauce and Tabasco sauce. Premade or packaged marinades. Premade or packaged taco seasonings. Relishes. Regular salad dressings. Where to find more information:  National Heart, Lung, and [place]: https://wilson-eaton.com/  American Heart Association: www.heart.org Summary  The DASH eating plan is a healthy eating plan that has been shown to  reduce high blood pressure (hypertension). It may also reduce your risk for type 2 diabetes, heart disease, and stroke.  With the DASH eating plan, you should limit salt (sodium) intake to 2,300 mg a day. If you have hypertension, you may need to reduce your sodium intake to 1,500 mg a day.  When on the DASH eating plan, aim to eat more fresh fruits and vegetables, whole grains, lean proteins, low-fat dairy, and heart-healthy  fats.  Work with your health care provider or diet and nutrition specialist (dietitian) to adjust your eating plan to your individual calorie needs. This information is not intended to replace advice given to you by your health care provider. Make sure you discuss any questions you have with your health care provider. Document Released: 03/09/2011 Document Revised: 03/13/2016 Document Reviewed: 03/13/2016 Elsevier Interactive Patient Education  2017 [employer].    If you need a refill on your cardiac medications before your next appointment, please call your pharmacy.

## 2017-03-19 ENCOUNTER — Telehealth: Payer: Self-pay | Admitting: Cardiology

## 2017-03-19 NOTE — Telephone Encounter (Signed)
Pt was advised to call today with a report on how he is feeling since BNP was more elevated than previously and changing Furosemide to Torsemide.  Pt states he feels about the same.  Denies swelling and breathing is about the same.  Pt did mentioned that for the 2 weeks prior to having medication changed he missed about half of his Furosemide doses because his mother passed away in [place] and he had to do some traveling for several days so he wasn't taking his meds.  Pt states he feels great but hasn't had any changes since last seen.  States urinating about the same as he did on Furosemide.  Advised I will send message to Dr. Tamala Julian and Ermalinda Barrios, PA-C since she was last to see him.

## 2017-03-19 NOTE — Telephone Encounter (Signed)
Patient calling, states that he was instructed to "report how he was feeling."

## 2017-03-19 NOTE — Telephone Encounter (Signed)
?   When does he follow up. Is weight stable.

## 2017-03-20 NOTE — Telephone Encounter (Signed)
Spoke with pt and he has not been weighing since last seen.  Denies swelling in lower extremities.  States this is much improved since last visit.  Educated pt on weighing and advised him to bring these weights with him to his appt with Truitt Merle, NP on 12/28.  Pt verbalized understanding and was in agreement with this plan.

## 2017-03-20 NOTE — Telephone Encounter (Signed)
[name], He had 3+ edema and was very short of breath when I saw him.  Can you ask him if he is lost any weight and if his edema has gone down on the torsemide?  He does have an appointment with Remer Macho 12/28

## 2017-03-21 ENCOUNTER — Other Ambulatory Visit: Payer: Self-pay

## 2017-03-21 MED ORDER — WARFARIN SODIUM 5 MG PO TABS: ORAL_TABLET | ORAL | 1 refills | Status: DC

## 2017-03-26 NOTE — Addendum Note (Signed)
Addended by: Lianne Cure A on: 03/26/2017 11:39 AM   Modules accepted: Orders

## 2017-03-30 ENCOUNTER — Ambulatory Visit (INDEPENDENT_AMBULATORY_CARE_PROVIDER_SITE_OTHER): Payer: Medicare Other | Admitting: Nurse Practitioner

## 2017-03-30 ENCOUNTER — Other Ambulatory Visit: Payer: Medicare Other

## 2017-03-30 ENCOUNTER — Other Ambulatory Visit: Payer: Self-pay | Admitting: *Deleted

## 2017-03-30 ENCOUNTER — Ambulatory Visit (INDEPENDENT_AMBULATORY_CARE_PROVIDER_SITE_OTHER): Payer: Medicare Other | Admitting: *Deleted

## 2017-03-30 ENCOUNTER — Encounter: Payer: Self-pay | Admitting: Nurse Practitioner

## 2017-03-30 VITALS — BP 100/60 | HR 72 | Ht 73.5 in | Wt 252.0 lb

## 2017-03-30 DIAGNOSIS — I482 Chronic atrial fibrillation, unspecified: Secondary | ICD-10-CM

## 2017-03-30 DIAGNOSIS — I5042 Chronic combined systolic (congestive) and diastolic (congestive) heart failure: Secondary | ICD-10-CM | POA: Diagnosis not present

## 2017-03-30 DIAGNOSIS — I6523 Occlusion and stenosis of bilateral carotid arteries: Secondary | ICD-10-CM

## 2017-03-30 DIAGNOSIS — I2510 Atherosclerotic heart disease of native coronary artery without angina pectoris: Secondary | ICD-10-CM

## 2017-03-30 DIAGNOSIS — I4891 Unspecified atrial fibrillation: Secondary | ICD-10-CM

## 2017-03-30 DIAGNOSIS — Z952 Presence of prosthetic heart valve: Secondary | ICD-10-CM

## 2017-03-30 DIAGNOSIS — Z5181 Encounter for therapeutic drug level monitoring: Secondary | ICD-10-CM

## 2017-03-30 DIAGNOSIS — R748 Abnormal levels of other serum enzymes: Secondary | ICD-10-CM

## 2017-03-30 LAB — POCT INR: INR: 3.1

## 2017-03-30 MED ORDER — TORSEMIDE 20 MG PO TABS: 20.0000 mg | ORAL_TABLET | Freq: Every day | ORAL | 3 refills | Status: DC

## 2017-03-30 NOTE — Patient Instructions (Addendum)
We will be checking the following labs today - BMET, CBC and BNP  Checking INR today   Medication Instructions:    Continue with your current medicines.     Testing/Procedures To Be Arranged:  N/A  Follow-Up:   See Dr. Tamala Julian in 2 months    Other Special Instructions:   Keep restricting your salt intake    If you need a refill on your cardiac medications before your next appointment, please call your pharmacy.   Call the [place] office at [phone] if you have any questions, problems or concerns.

## 2017-03-30 NOTE — Progress Notes (Signed)
CARDIOLOGY OFFICE NOTE  Date:  03/30/2017    [name] Date of Birth: 11/14/1935 Medical Record #[id number]  PCP:  Cassandria Anger, MD  Cardiologist:  Tamala Julian  Chief Complaint  Patient presents with  . Coronary Artery Disease  . Congestive Heart Failure  . Cardiac Valve Problem    2 week check - seen for Dr. Tamala Julian    History of Present Illness: [name] is a 81 y.o. male who presents today for a follow up visit. Seen for Dr. Tamala Julian.   He has a history of CAD status post CABG & mechanical AVR, chronic atrial fibrillation on Coumadin, chronic combined systolic and diastolic CHF, hypertension, & OSA (followed by Dr. Radford Pax).    Last saw Dr. Tamala Julian 01/2017 and he was complaining of dyspnea.  Was felt likely related to CHF but also a component of deconditioning.  BNP was over 1000 and Lasix was increased to 60 mg daily.  2D echo 02/07/17 normal LVEF 55-60% with normal-appearing mechanical AVR.  Baseline creatinine about 1.3 went up to1.45.   Seen by Estella Husk earlier this month for follow up - only marginally better - lots of excess salt use. Still with edema. Has been switched over to Torsemide.   Comes in today. Here alone. He is doing much better. Says he feels better. Swelling basically resolved. Breathing stable. No chest pain. Likes the Torsemide better but with talking with him - he had not really been taking his Lasix regularly. He is trying to watch his salt better. He has no real concerns.   Past Medical History:  Diagnosis Date  . Aortic stenosis   . Carotid artery disease ([place]) 1994   s/p left carotid endarerectomy   . CHF with unknown LVEF (HCC)    47 %  . Chronic atrial fibrillation ([place])   . Hx of CABG   . Hypertension   . Left ventricular dysfunction 2007   LVEF 47%   . OSA (obstructive sleep apnea)   . Rheumatic fever   . Subclavian bypass stenosis ([place])   . Vitamin B12 deficiency     Past Surgical History:  Procedure Laterality Date    . [place]   replaced due to aortic stenosis, St. Jude mechanical prostesis  . CAROTID ENDARTERECTOMY Left 1997   subclavian bypass Done in [place]  . CORONARY ARTERY BYPASS GRAFT  1994   w SVG to RCA and SVG to circumflex  . heart bypass     Done in [place]     Medications: Current Meds  Medication Sig  . ampicillin (PRINCIPEN) 500 MG capsule TAKE 4 TABLETS BY MOUTH 30-60 MINUTES BEFORE YOUR PROCEDURE  . carvedilol (COREG) 25 MG tablet Take 1 tablet (25 mg total) by mouth 2 (two) times daily with a meal.  . Cyanocobalamin (VITAMIN B12) 3000 MCG/ML LIQD Place 2,000 mcg under the tongue daily.  . Dietary Management Product (VASCULERA) TABS Take 1 tablet by mouth daily.  . enalapril (VASOTEC) 20 MG tablet Take 1 tablet (20 mg total) by mouth daily.  Marland Kitchen ezetimibe-simvastatin (VYTORIN) 10-20 MG per tablet Take 1 tablet by mouth at bedtime.  . fluocinonide cream (LIDEX) 9.93 % Apply 1 application topically 2 (two) times daily.   . folic acid (FOLVITE) 1 MG tablet Take 1 tablet (1 mg total) by mouth daily.  Marland Kitchen glucose blood (ONE TOUCH ULTRA TEST) test strip USE AS DIRECTED TO TEST BLOOD GLUCOSE  EVERY OTHER DAY DX: 250.00  .  metFORMIN (GLUCOPHAGE-XR) 750 MG 24 hr tablet Take 2 tablets (1,500 mg total) by mouth daily.  . NON FORMULARY BIPAP  . NON FORMULARY ONE TOUCH ULTRA TEST STRIPS  AND LANCETS  . ONETOUCH DELICA LANCETS FINE MISC Use to obtain a blood specimen every other day Dx code 250.00  . PROAIR HFA 108 (90 BASE) MCG/ACT inhaler Inhale 2 puffs into the lungs every 6 (six) hours as needed. For chest congestion  . torsemide (DEMADEX) 20 MG tablet Take 2 tablets (40 mg total) by mouth daily.  Marland Kitchen warfarin (COUMADIN) 5 MG tablet TAKE AS DIRECTED BY COUMADIN CLINIC     Allergies: Allergies  Allergen Reactions  . Rifampin     REACTION: rash (unclear if due to vanco or rifampin)  . Vancomycin     REACTION: rash (unclear if due to rifampin or vanco)    Social  History: The patient  reports that he quit smoking about 25 years ago. His smoking use included cigarettes. He has a 30.00 pack-year smoking history. he has never used smokeless tobacco. He reports that he drinks alcohol. He reports that he does not use drugs.   Family History: The patient's family history includes Cancer in his father; Hypertension in his mother.   Review of Systems: Please see the history of present illness.   Otherwise, the review of systems is positive for none.   All other systems are reviewed and negative.   Physical Exam: VS:  BP 100/60 (BP Location: Right Arm, Patient Position: Sitting, Cuff Size: Normal)   Pulse 72   Ht 6' 1.5" (1.867 m)   Wt 252 lb (114.3 kg)   SpO2 96% Comment: at rest  BMI 32.80 kg/m  .  BMI Body mass index is 32.8 kg/m.  Wt Readings from Last 3 Encounters:  03/30/17 252 lb (114.3 kg)  03/15/17 264 lb 12.8 oz (120.1 kg)  03/09/17 262 lb 8 oz (119.1 kg)    General: Elderly male. Looks chronically ill. Alert and in no acute distress.  His weight is down 12 pounds since last visit.  HEENT: Normal.  Neck: Supple, no JVD, carotid bruits, or masses noted.  Cardiac: Irregular irregular rhythm. Rate is ok.  Valve is crisp. Soft outflow murmur. Brawny stasis changes of the legs - no real edema.  Respiratory:  Lungs are clear to auscultation bilaterally with normal work of breathing.  GI: Soft and nontender.  MS: No deformity or atrophy. Gait and ROM intact.  Skin: Warm and dry. Color is normal.  Neuro:  Strength and sensation are intact and no gross focal deficits noted.  Psych: Alert, appropriate and with normal affect.   LABORATORY DATA:  EKG:  EKG is not ordered today.  Lab Results  Component Value Date   WBC 3.3 (L) 01/31/2017   HGB 11.6 (L) 01/31/2017   HCT 34.5 (L) 01/31/2017   PLT 67 (LL) 01/31/2017   GLUCOSE 100 (H) 03/15/2017   CHOL 116 08/21/2016   TRIG 55.0 08/21/2016   HDL 48.10 08/21/2016   LDLCALC 57 08/21/2016    ALT 14 01/31/2017   AST 17 01/31/2017   NA 143 03/15/2017   K 4.7 03/15/2017   CL 106 03/15/2017   CREATININE 1.27 03/15/2017   BUN 38 (H) 03/15/2017   CO2 23 03/15/2017   TSH 0.760 Test methodology is 3rd generation TSH 12/30/2008   INR 3.5 03/06/2017   HGBA1C 5.6 02/21/2017   MICROALBUR 41.6 (H) 02/21/2017     BNP (last 3 results) No  results for input(s): BNP in the last 8760 hours.  ProBNP (last 3 results) Recent Labs    01/31/17 0855 03/15/17 0923  PROBNP 1,118* 1,655*     Other Studies Reviewed Today:  2D echo 03-05-17  Study Conclusions  - Left ventricle: Wall thickness was increased in a pattern of mild LVH. Systolic function was normal. The estimated ejection fraction was in the range of 55% to 60%. - Aortic valve: Normal appearing mechanical AVR with no peri valvular regurgitation. - Mitral valve: Moderately calcified annulus. Moderately thickened leaflets . There was moderate regurgitation. - Left atrium: The atrium was severely dilated. - Atrial septum: No defect or patent foramen ovale was identified.  ABI(Date:03/09/2017):  Stable and normal bilateral ABI.   Assessment/Plan: 1. CAD with prior CABG & AVR - most recent echo stable - would favor continuing his current regimen. No changes made today.   2. Chronic diastolic HF - weight is down. Symptoms have improved. [place] lab today.   3. CKD - checking lab today.   4. HTN - BP ok - actually a little soft - not symptomatic. Will follow.   5. Persistent AF - rate is ok. Managed with rate control and anticoagulation. Little nose bleed today - INR today. May be from the dry air/weather change.   6. Chronic anticoagulation - INR today.   Current medicines are reviewed with the patient today.  The patient does not have concerns regarding medicines other than what has been noted above.  The following changes have been made:  See above.  Labs/ tests ordered today include:    Orders  Placed This Encounter  Procedures  . Basic metabolic panel  . CBC  . Pro b natriuretic peptide (BNP)     Disposition:   FU with Dr. Tamala Julian in about 2 months.  Patient is agreeable to this plan and will call if any problems develop in the interim.   SignedTruitt Merle, NP  03/30/2017 11:26 AM  [facility] [address] [place], [place]  [address] Phone: [phone] Fax: [phone]

## 2017-03-30 NOTE — Patient Instructions (Signed)
Description   Continue on same dosage 1 tablet daily except 1/2 tablet each Sundays, Wednesdays, and Fridays.  Repeat INR in 4 weeks.  Call with any new medications or procedures [phone]

## 2017-03-31 LAB — CBC
Hematocrit: 33.8 % — ABNORMAL LOW (ref 37.5–51.0)
Hemoglobin: 11.6 g/dL — ABNORMAL LOW (ref 13.0–17.7)
MCH: 36.5 pg — ABNORMAL HIGH (ref 26.6–33.0)
MCHC: 34.3 g/dL (ref 31.5–35.7)
MCV: 106 fL — ABNORMAL HIGH (ref 79–97)
Platelets: 59 10*3/uL — CL (ref 150–379)
RBC: 3.18 x10E6/uL — ABNORMAL LOW (ref 4.14–5.80)
RDW: 14.5 % (ref 12.3–15.4)
WBC: 3 10*3/uL — ABNORMAL LOW (ref 3.4–10.8)

## 2017-03-31 LAB — BASIC METABOLIC PANEL
BUN/Creatinine Ratio: 31 — ABNORMAL HIGH (ref 10–24)
BUN: 63 mg/dL — ABNORMAL HIGH (ref 8–27)
CO2: 23 mmol/L (ref 20–29)
Calcium: 9.1 mg/dL (ref 8.6–10.2)
Chloride: 101 mmol/L (ref 96–106)
Creatinine, Ser: 2.01 mg/dL — ABNORMAL HIGH (ref 0.76–1.27)
GFR calc Af Amer: 35 mL/min/{1.73_m2} — ABNORMAL LOW (ref >59)
GFR calc non Af Amer: 31 mL/min/{1.73_m2} — ABNORMAL LOW (ref >59)
Glucose: 125 mg/dL — ABNORMAL HIGH (ref 65–99)
Potassium: 5 mmol/L (ref 3.5–5.2)
Sodium: 142 mmol/L (ref 134–144)

## 2017-03-31 LAB — PRO B NATRIURETIC PEPTIDE: NT-Pro BNP: 1858 pg/mL — ABNORMAL HIGH (ref 0–486)

## 2017-04-05 ENCOUNTER — Ambulatory Visit (INDEPENDENT_AMBULATORY_CARE_PROVIDER_SITE_OTHER): Payer: Medicare Other | Admitting: Internal Medicine

## 2017-04-05 ENCOUNTER — Other Ambulatory Visit: Payer: Medicare Other

## 2017-04-05 ENCOUNTER — Encounter: Payer: Self-pay | Admitting: Internal Medicine

## 2017-04-05 DIAGNOSIS — D696 Thrombocytopenia, unspecified: Secondary | ICD-10-CM

## 2017-04-05 DIAGNOSIS — L570 Actinic keratosis: Secondary | ICD-10-CM | POA: Diagnosis not present

## 2017-04-05 DIAGNOSIS — I5042 Chronic combined systolic (congestive) and diastolic (congestive) heart failure: Secondary | ICD-10-CM

## 2017-04-05 DIAGNOSIS — R609 Edema, unspecified: Secondary | ICD-10-CM

## 2017-04-05 DIAGNOSIS — E118 Type 2 diabetes mellitus with unspecified complications: Secondary | ICD-10-CM | POA: Diagnosis not present

## 2017-04-05 DIAGNOSIS — I482 Chronic atrial fibrillation, unspecified: Secondary | ICD-10-CM

## 2017-04-05 DIAGNOSIS — I70219 Atherosclerosis of native arteries of extremities with intermittent claudication, unspecified extremity: Secondary | ICD-10-CM

## 2017-04-05 MED ORDER — EZETIMIBE-SIMVASTATIN 10-20 MG PO TABS: 1.0000 | ORAL_TABLET | Freq: Every day | ORAL | 3 refills | Status: DC

## 2017-04-05 MED ORDER — FLUOCINONIDE 0.05 % EX CREA: 1.0000 "application " | TOPICAL_CREAM | Freq: Two times a day (BID) | CUTANEOUS | 3 refills | Status: DC

## 2017-04-05 MED ORDER — VASCULERA PO TABS: 1.0000 | ORAL_TABLET | Freq: Every day | ORAL | 3 refills | Status: DC

## 2017-04-05 MED ORDER — VITAMIN D3 50 MCG (2000 UT) PO CAPS: 2000.0000 [IU] | ORAL_CAPSULE | Freq: Every day | ORAL | 3 refills | Status: DC

## 2017-04-05 NOTE — Progress Notes (Signed)
Subjective:  Patient ID: [name], male    DOB: Mar 24, 1936  Age: 82 y.o. MRN: [id number]  CC: No chief complaint on file.   HPI [name] presents for skin lesions, rash on legs, dyslipidemia, DM f/u  Past Medical History:  Diagnosis Date  . Aortic stenosis   . Carotid artery disease ([place]) 1994   s/p left carotid endarerectomy   . CHF with unknown LVEF (HCC)    47 %  . Chronic atrial fibrillation ([place])   . Hx of CABG   . Hypertension   . Left ventricular dysfunction 2007   LVEF 47%   . OSA (obstructive sleep apnea)   . Rheumatic fever   . Subclavian bypass stenosis ([place])   . Vitamin B12 deficiency    Past Surgical History:  Procedure Laterality Date  . [place]   replaced due to aortic stenosis, St. Jude mechanical prostesis  . CAROTID ENDARTERECTOMY Left 1997   subclavian bypass Done in [place]  . CORONARY ARTERY BYPASS GRAFT  1994   w SVG to RCA and SVG to circumflex  . heart bypass     Done in [place]    reports that he quit smoking about 25 years ago. His smoking use included cigarettes. He has a 30.00 pack-year smoking history. he has never used smokeless tobacco. He reports that he drinks alcohol. He reports that he does not use drugs. family history includes Cancer in his father; Hypertension in his mother. Allergies  Allergen Reactions  . Rifampin     REACTION: rash (unclear if due to vanco or rifampin)  . Vancomycin     REACTION: rash (unclear if due to rifampin or vanco)     ROS Review of Systems  Constitutional: Negative for appetite change, fatigue and unexpected weight change.  HENT: Negative for congestion, nosebleeds, sneezing, sore throat and trouble swallowing.   Eyes: Negative for itching and visual disturbance.  Respiratory: Negative for cough.   Cardiovascular: Positive for leg swelling. Negative for chest pain and palpitations.  Gastrointestinal: Negative for abdominal distention, blood in stool, diarrhea and  nausea.  Genitourinary: Negative for frequency and hematuria.  Musculoskeletal: Negative for back pain, gait problem, joint swelling and neck pain.  Skin: Positive for color change and rash.  Neurological: Negative for dizziness, tremors, speech difficulty and weakness.  Psychiatric/Behavioral: Negative for agitation, dysphoric mood and sleep disturbance. The patient is not nervous/anxious.     Objective:  BP 106/70 (BP Location: Right Arm, Patient Position: Sitting, Cuff Size: Large)   Pulse 64   Temp 98.4 F (36.9 C) (Oral)   Ht 6' 1.5" (1.867 m)   Wt 257 lb (116.6 kg)   SpO2 95%   BMI 33.45 kg/m   BP Readings from Last 3 Encounters:  04/05/17 106/70  03/30/17 100/60  03/15/17 104/60    Wt Readings from Last 3 Encounters:  04/05/17 257 lb (116.6 kg)  03/30/17 252 lb (114.3 kg)  03/15/17 264 lb 12.8 oz (120.1 kg)    Physical Exam  Constitutional: He is oriented to person, place, and time. He appears well-developed. No distress.  NAD  HENT:  Mouth/Throat: Oropharynx is clear and moist.  Eyes: Conjunctivae are normal. Pupils are equal, round, and reactive to light.  Neck: Normal range of motion. No JVD present. No thyromegaly present.  Cardiovascular: Normal rate, regular rhythm, normal heart sounds and intact distal pulses. Exam reveals no gallop and no friction rub.  No murmur heard. Pulmonary/Chest: Effort normal and  breath sounds normal. No respiratory distress. He has no wheezes. He has no rales. He exhibits no tenderness.  Abdominal: Soft. Bowel sounds are normal. He exhibits no distension and no mass. There is no tenderness. There is no rebound and no guarding.  Musculoskeletal: Normal range of motion. He exhibits no edema or tenderness.  Lymphadenopathy:    He has no cervical adenopathy.  Neurological: He is alert and oriented to person, place, and time. He has normal reflexes. No cranial nerve deficit. He exhibits normal muscle tone. He displays a negative Romberg  sign. Coordination and gait normal.  Skin: Skin is warm and dry. No rash noted.  Psychiatric: He has a normal mood and affect. His behavior is normal. Judgment and thought content normal.  AKs on scalp Purple LE, trace swelling    Procedure Note :     Procedure : Cryosurgery   Indication: Actinic keratosis(es)   Risks including unsuccessful procedure , bleeding, infection, bruising, scar, a need for a repeat  procedure and others were explained to the patient in detail as well as the benefits. Informed consent was obtained verbally.   8  lesion(s)  on scalp   was/were treated with liquid nitrogen on a Q-tip in a usual fasion . Band-Aid was applied and antibiotic ointment was given for a later use.   Tolerated well. Complications none.   Postprocedure instructions :     Keep the wounds clean. You can wash them with liquid soap and water. Pat dry with gauze or a Kleenex tissue  Before applying antibiotic ointment and a Band-Aid.   You need to report immediately  if  any signs of infection develop.     No results found.  Assessment & Plan:   There are no diagnoses linked to this encounter. I am having Decorey Homewood maintain his NON FORMULARY, fluocinonide cream, NON FORMULARY, ezetimibe-simvastatin, ONETOUCH DELICA LANCETS FINE, PROAIR HFA, Vitamin I29, folic acid, carvedilol, glucose blood, metFORMIN, VASCULERA, enalapril, ampicillin, warfarin, and torsemide.  No orders of the defined types were placed in this encounter.    Follow-up: No Follow-up on file.  Walker Kehr, MD

## 2017-04-05 NOTE — Assessment & Plan Note (Signed)
Metformin XR 

## 2017-04-05 NOTE — Assessment & Plan Note (Signed)
See procedure 

## 2017-04-05 NOTE — Patient Instructions (Addendum)
Well MC w/Jill 

## 2017-04-05 NOTE — Assessment & Plan Note (Signed)
Elevate legs Compression socks Vasculera Demadex

## 2017-04-11 ENCOUNTER — Ambulatory Visit: Payer: Medicare Other | Admitting: Cardiology

## 2017-04-11 ENCOUNTER — Other Ambulatory Visit: Payer: Medicare Other | Admitting: *Deleted

## 2017-04-11 ENCOUNTER — Other Ambulatory Visit: Payer: Medicare Other

## 2017-04-11 DIAGNOSIS — R748 Abnormal levels of other serum enzymes: Secondary | ICD-10-CM | POA: Diagnosis not present

## 2017-04-11 LAB — BASIC METABOLIC PANEL
BUN/Creatinine Ratio: 36 — ABNORMAL HIGH (ref 10–24)
BUN: 59 mg/dL — ABNORMAL HIGH (ref 8–27)
CO2: 20 mmol/L (ref 20–29)
Calcium: 9.3 mg/dL (ref 8.6–10.2)
Chloride: 104 mmol/L (ref 96–106)
Creatinine, Ser: 1.62 mg/dL — ABNORMAL HIGH (ref 0.76–1.27)
GFR calc Af Amer: 46 mL/min/{1.73_m2} — ABNORMAL LOW (ref >59)
GFR calc non Af Amer: 39 mL/min/{1.73_m2} — ABNORMAL LOW (ref >59)
Glucose: 112 mg/dL — ABNORMAL HIGH (ref 65–99)
Potassium: 5.2 mmol/L (ref 3.5–5.2)
Sodium: 139 mmol/L (ref 134–144)

## 2017-04-12 ENCOUNTER — Other Ambulatory Visit: Payer: Self-pay | Admitting: Interventional Cardiology

## 2017-04-12 MED ORDER — CARVEDILOL 25 MG PO TABS: 25.0000 mg | ORAL_TABLET | Freq: Two times a day (BID) | ORAL | 3 refills | Status: DC

## 2017-04-13 ENCOUNTER — Other Ambulatory Visit: Payer: Self-pay | Admitting: *Deleted

## 2017-04-13 DIAGNOSIS — N179 Acute kidney failure, unspecified: Secondary | ICD-10-CM

## 2017-04-13 MED ORDER — TORSEMIDE 20 MG PO TABS: 20.0000 mg | ORAL_TABLET | ORAL | 3 refills | Status: DC

## 2017-04-20 ENCOUNTER — Inpatient Hospital Stay: Payer: Medicare Other | Attending: Hematology

## 2017-04-20 ENCOUNTER — Other Ambulatory Visit: Payer: Self-pay

## 2017-04-20 DIAGNOSIS — I482 Chronic atrial fibrillation: Secondary | ICD-10-CM | POA: Insufficient documentation

## 2017-04-20 DIAGNOSIS — D7589 Other specified diseases of blood and blood-forming organs: Secondary | ICD-10-CM | POA: Insufficient documentation

## 2017-04-20 DIAGNOSIS — Z7901 Long term (current) use of anticoagulants: Secondary | ICD-10-CM | POA: Diagnosis not present

## 2017-04-20 DIAGNOSIS — D696 Thrombocytopenia, unspecified: Secondary | ICD-10-CM | POA: Diagnosis not present

## 2017-04-20 LAB — CMP (CANCER CENTER ONLY)
ALBUMIN: 3.9 g/dL (ref 3.5–5.0)
ALK PHOS: 164 U/L — AB (ref 40–150)
ALT: 13 U/L (ref 0–55)
ANION GAP: 9 (ref 3–11)
AST: 17 U/L (ref 5–34)
BILIRUBIN TOTAL: 1.1 mg/dL (ref 0.2–1.2)
BUN: 49 mg/dL — ABNORMAL HIGH (ref 7–26)
CALCIUM: 9.1 mg/dL (ref 8.4–10.4)
CHLORIDE: 110 mmol/L — AB (ref 98–109)
CO2: 21 mmol/L — ABNORMAL LOW (ref 22–29)
Creatinine: 1.43 mg/dL — ABNORMAL HIGH (ref 0.70–1.30)
GFR, EST AFRICAN AMERICAN: 52 mL/min — AB (ref >60)
GFR, Estimated: 45 mL/min — ABNORMAL LOW (ref >60)
Glucose, Bld: 92 mg/dL (ref 70–140)
Potassium: 5.2 mmol/L — ABNORMAL HIGH (ref 3.5–5.1)
Sodium: 140 mmol/L (ref 136–145)
Total Protein: 7.3 g/dL (ref 6.4–8.3)

## 2017-04-20 LAB — CBC WITH DIFFERENTIAL (CANCER CENTER ONLY)
BASOS PCT: 1 %
Basophils Absolute: 0 10*3/uL (ref 0.0–0.1)
EOS ABS: 0.1 10*3/uL (ref 0.0–0.5)
Eosinophils Relative: 2 %
HEMATOCRIT: 32.9 % — AB (ref 38.4–49.9)
Hemoglobin: 10.8 g/dL — ABNORMAL LOW (ref 13.0–17.1)
LYMPHS ABS: 0.5 10*3/uL — AB (ref 0.9–3.3)
Lymphocytes Relative: 18 %
MCH: 36.8 pg — ABNORMAL HIGH (ref 27.2–33.4)
MCHC: 32.7 g/dL (ref 32.0–36.0)
MCV: 112.5 fL — ABNORMAL HIGH (ref 79.3–98.0)
Monocytes Absolute: 0.3 10*3/uL (ref 0.1–0.9)
Monocytes Relative: 9 %
NEUTROS ABS: 2 10*3/uL (ref 1.5–6.5)
Neutrophils Relative %: 70 %
Platelet Count: 53 10*3/uL — ABNORMAL LOW (ref 140–400)
RBC: 2.93 MIL/uL — AB (ref 4.20–5.82)
RDW: 15.4 % (ref 11.0–15.6)
WBC: 2.8 10*3/uL — AB (ref 4.0–10.3)

## 2017-04-20 LAB — VITAMIN B12: VITAMIN B 12: 911 pg/mL (ref 180–914)

## 2017-04-20 LAB — FERRITIN: Ferritin: 154 ng/mL (ref 22–316)

## 2017-04-27 ENCOUNTER — Ambulatory Visit (INDEPENDENT_AMBULATORY_CARE_PROVIDER_SITE_OTHER): Payer: Medicare Other | Admitting: Pharmacist

## 2017-04-27 DIAGNOSIS — Z952 Presence of prosthetic heart valve: Secondary | ICD-10-CM | POA: Diagnosis not present

## 2017-04-27 DIAGNOSIS — Z5181 Encounter for therapeutic drug level monitoring: Secondary | ICD-10-CM

## 2017-04-27 DIAGNOSIS — I4891 Unspecified atrial fibrillation: Secondary | ICD-10-CM

## 2017-04-27 LAB — POCT INR: INR: 3

## 2017-04-27 NOTE — Patient Instructions (Signed)
Description   Continue on same dosage 1 tablet daily except 1/2 tablet each Sundays, Wednesdays, and Fridays.  Repeat INR in 6 weeks.  Call with any new medications or procedures [phone]

## 2017-04-30 ENCOUNTER — Other Ambulatory Visit: Payer: Self-pay

## 2017-04-30 ENCOUNTER — Encounter: Payer: Self-pay | Admitting: Endocrinology

## 2017-04-30 ENCOUNTER — Ambulatory Visit (INDEPENDENT_AMBULATORY_CARE_PROVIDER_SITE_OTHER): Payer: Medicare Other | Admitting: Endocrinology

## 2017-04-30 VITALS — BP 104/62 | HR 64 | Ht 73.5 in | Wt 257.8 lb

## 2017-04-30 DIAGNOSIS — I70219 Atherosclerosis of native arteries of extremities with intermittent claudication, unspecified extremity: Secondary | ICD-10-CM

## 2017-04-30 DIAGNOSIS — E118 Type 2 diabetes mellitus with unspecified complications: Secondary | ICD-10-CM | POA: Diagnosis not present

## 2017-04-30 LAB — POCT GLUCOSE (DEVICE FOR HOME USE): POC GLUCOSE: 96 mg/dL (ref 70–99)

## 2017-04-30 NOTE — Progress Notes (Signed)
Patient ID: [name], male   DOB: 1935-11-18, 82 y.o.   MRN: [id number]   Reason for Appointment: Diabetes follow-up   History of Present Illness   Diagnosis: Type 2 DIABETES MELITUS date of onset: 07/2010       Oral hypoglycemic drugs: metformin 2250 mg daily       Side effects from medications: None  He was started on metformin when his baseline A1c was 6.9% and he had significant obesity He was also sent for diabetes education. Since then A1c had been consistently upper normal  Recent history:  Oral hypoglycemic drugs: Metformin ER 750 mg, 2 daily  His A1c again is excellent at 5.6, previously ranging from 5.4 up to 5.8%  He was last seen in 08/2016  With his usually excellent control he was told to reduce his metformin down to 1500 mg; also was having occasional blood sugars as low as 61 and occasionally loose stools With this change he has not had any increase in his blood sugar control at home blood sugars have been normal although he has not checked lately He did not have refills on his strips and has not checked glucose Again because of his cardiac problems he is not able to be very active Still not losing weight although he is usually trying to eat healthy    Monitors blood glucose:  every other day, none since November  Overall median 90  Blood sugar range 75-124 with some readings midmorning and mostly after 8 PM   Meals: breakfast:occasional cereal otherwise muffin and peanut butter  Physical activity: exercise: walking more recently             Dietician visit: Most recent:2012          Last eye exam: annual    Wt Readings from Last 3 Encounters:  04/30/17 257 lb 12.8 oz (116.9 kg)  04/05/17 257 lb (116.6 kg)  03/30/17 252 lb (114.3 kg)   Lab Results  Component Value Date   HGBA1C 5.6 02/21/2017   HGBA1C 5.7 08/21/2016   HGBA1C 5.4 02/11/2016   Lab Results  Component Value Date   MICROALBUR 41.6 (H) 02/21/2017   LDLCALC 57 08/21/2016    CREATININE 1.62 (H) 04/11/2017    LABS:  Office Visit on 04/30/2017  Component Date Value Ref Range Status  . POC Glucose 04/30/2017 96  70 - 99 mg/dl Final  Anti-coag visit on 04/27/2017  Component Date Value Ref Range Status  . INR 04/27/2017 3.0   Final    Allergies as of 04/30/2017      Reactions   Rifampin    REACTION: rash (unclear if due to vanco or rifampin)   Vancomycin    REACTION: rash (unclear if due to rifampin or vanco)      Medication List        Accurate as of 04/30/17  1:54 PM. Always use your most recent med list.          ampicillin 500 MG capsule Commonly known as:  PRINCIPEN TAKE 4 TABLETS BY MOUTH 30-60 MINUTES BEFORE YOUR PROCEDURE   carvedilol 25 MG tablet Commonly known as:  COREG Take 1 tablet (25 mg total) by mouth 2 (two) times daily with a meal.   enalapril 20 MG tablet Commonly known as:  VASOTEC Take 1 tablet (20 mg total) by mouth daily.   ezetimibe-simvastatin 10-20 MG tablet Commonly known as:  VYTORIN Take 1 tablet by mouth at bedtime.   fluocinonide cream 0.05 %  Commonly known as:  LIDEX Apply 1 application topically 2 (two) times daily.   folic acid 1 MG tablet Commonly known as:  FOLVITE Take 1 tablet (1 mg total) by mouth daily.   glucose blood test strip Commonly known as:  ONE TOUCH ULTRA TEST USE AS DIRECTED TO TEST BLOOD GLUCOSE  EVERY OTHER DAY DX: 250.00   metFORMIN 750 MG 24 hr tablet Commonly known as:  GLUCOPHAGE-XR Take 2 tablets (1,500 mg total) by mouth daily.   NON FORMULARY BIPAP   NON FORMULARY ONE TOUCH ULTRA TEST STRIPS  AND LANCETS   ONETOUCH DELICA LANCETS FINE Misc Use to obtain a blood specimen every other day Dx code 250.00   PROAIR HFA 108 (90 Base) MCG/ACT inhaler Generic drug:  albuterol Inhale 2 puffs into the lungs every 6 (six) hours as needed. For chest congestion   torsemide 20 MG tablet Commonly known as:  DEMADEX Take 1 tablet (20 mg total) by mouth every other day.     VASCULERA Tabs Take 1 tablet by mouth daily.   Vitamin B12 3000 MCG/ML Liqd Place 2,000 mcg under the tongue daily.   Vitamin D3 2000 units capsule Take 1 capsule (2,000 Units total) by mouth daily.   warfarin 5 MG tablet Commonly known as:  COUMADIN Take as directed by the anticoagulation clinic. If you are unsure how to take this medication, talk to your nurse or doctor. Original instructions:  TAKE AS DIRECTED BY COUMADIN CLINIC       Allergies:  Allergies  Allergen Reactions  . Rifampin     REACTION: rash (unclear if due to vanco or rifampin)  . Vancomycin     REACTION: rash (unclear if due to rifampin or vanco)    Past Medical History:  Diagnosis Date  . Aortic stenosis   . Carotid artery disease ([place]) 1994   s/p left carotid endarerectomy   . CHF with unknown LVEF (HCC)    47 %  . Chronic atrial fibrillation ([place])   . Hx of CABG   . Hypertension   . Left ventricular dysfunction 2007   LVEF 47%   . OSA (obstructive sleep apnea)   . Rheumatic fever   . Subclavian bypass stenosis ([place])   . Vitamin B12 deficiency     Past Surgical History:  Procedure Laterality Date  . [place]   replaced due to aortic stenosis, St. Jude mechanical prostesis  . CAROTID ENDARTERECTOMY Left 1997   subclavian bypass Done in [place]  . CORONARY ARTERY BYPASS GRAFT  1994   w SVG to RCA and SVG to circumflex  . heart bypass     Done in [place]    Family History  Problem Relation Age of Onset  . Cancer Father        lung   . Hypertension Mother     Social History:  reports that he quit smoking about 25 years ago. His smoking use included cigarettes. He has a 30.00 pack-year smoking history. he has never used smokeless tobacco. He reports that he drinks alcohol. He reports that he does not use drugs.  Review of Systems    HYPERTENSION:  he is taking Coreg and Enalapril, prescribed by his cardiologist No lightheadedness Blood pressure was checked  twice today  BP Readings from Last 3 Encounters:  04/30/17 104/62  04/05/17 106/70  03/30/17 100/60   He has had renal dysfunction recently and has diabetic prescription was reduced from 60 mg the medics down  to 20 mg every other day now Also has been advised elastic stockings but he does not have these  Lab Results  Component Value Date   CREATININE 1.62 (H) 04/11/2017   BUN 49 (H) 04/20/2017   NA 140 04/20/2017   K 5.2 (H) 04/20/2017   CL 110 (H) 04/20/2017   CO2 21 (L) 04/20/2017     HYPERLIPIDEMIA:         The lipid abnormality consists of elevated LDL treated with Vytorin and followed by PCP    Lab Results  Component Value Date   CHOL 116 08/21/2016   HDL 48.10 08/21/2016   LDLCALC 57 08/21/2016   TRIG 55.0 08/21/2016   CHOLHDL 2 08/21/2016     Last foot exam in 11/17 showing peripheral vascular disease and only mild neuropathy   Examination:   BP 104/62 (BP Location: Right Arm, Cuff Size: Normal)   Pulse 64   Ht 6' 1.5" (1.867 m)   Wt 257 lb 12.8 oz (116.9 kg)   BMI 33.55 kg/m   Body mass index is 33.55 kg/m.   Diabetic Foot Exam - Simple   Simple Foot Form Diabetic Foot exam was performed with the following findings:  Yes   Visual Inspection No deformities, no ulcerations, no other skin breakdown bilaterally:  Yes See comments:  Yes Sensation Testing Intact to touch and monofilament testing bilaterally:  Yes See comments:  Yes Pulse Check See comments:  Yes Comments Pedal pulse is not palpable 3+ and lower leg edema present Monofilament sensation relatively less in the right fifth toe     Assesment/PLAN:   Diabetes type 2, Mild and associated with obesity   The patient's diabetes is overall well controlled with normal A1c again This is with taking only 1500 mg of metformin ER He has not checked his blood sugars recently but these appear to be fairly good in the lab and today in the office He has difficulty losing weight but generally tries  to do well with his diet  He will continue metformin unchanged unless his renal function is consistently worse This will be checked on Friday by cardiologist Test strips prescribed for his glucose monitoring  HYPERTENSION: Blood pressure is controlled He will follow-up with PCP and cardiologist  Follow-up in 5  months  Lorielle Boehning 04/30/2017, 1:54 PM   Note: This office note was prepared with Dragon voice recognition system technology. Any transcriptional errors that result from this process are unintentional.

## 2017-04-30 NOTE — Patient Instructions (Signed)
Check blood sugars on waking up  1/7  Also check blood sugars about 2 hours after a meal and do this after different meals by rotation  Recommended blood sugar levels on waking up is 90-130 and about 2 hours after meal is 130-160  Please bring your blood sugar monitor to each visit, thank you

## 2017-05-01 ENCOUNTER — Telehealth: Payer: Self-pay | Admitting: Endocrinology

## 2017-05-01 NOTE — Telephone Encounter (Signed)
Need prescription for test strips for [facility] LANCETS FINE MISC [[id number]]  [id number] - , [place] [phone] (Phone) [phone] (Fax)

## 2017-05-03 NOTE — Telephone Encounter (Signed)
Pt is needing script for test strips for ONETOUCH VIRO FLEX .  They are not listed under his medication list  He is needing them sent in.    [id number] [place], [place] - [address]

## 2017-05-04 ENCOUNTER — Telehealth: Payer: Self-pay | Admitting: Nurse Practitioner

## 2017-05-04 ENCOUNTER — Other Ambulatory Visit: Payer: Medicare Other | Admitting: *Deleted

## 2017-05-04 DIAGNOSIS — E875 Hyperkalemia: Secondary | ICD-10-CM

## 2017-05-04 DIAGNOSIS — N179 Acute kidney failure, unspecified: Secondary | ICD-10-CM | POA: Diagnosis not present

## 2017-05-04 LAB — BASIC METABOLIC PANEL
BUN/Creatinine Ratio: 30 — ABNORMAL HIGH (ref 10–24)
BUN: 42 mg/dL — ABNORMAL HIGH (ref 8–27)
CO2: 16 mmol/L — ABNORMAL LOW (ref 20–29)
Calcium: 9.3 mg/dL (ref 8.6–10.2)
Chloride: 104 mmol/L (ref 96–106)
Creatinine, Ser: 1.4 mg/dL — ABNORMAL HIGH (ref 0.76–1.27)
GFR calc Af Amer: 54 mL/min/{1.73_m2} — ABNORMAL LOW (ref >59)
GFR calc non Af Amer: 47 mL/min/{1.73_m2} — ABNORMAL LOW (ref >59)
Glucose: 114 mg/dL — ABNORMAL HIGH (ref 65–99)
Potassium: 6.1 mmol/L — ABNORMAL HIGH (ref 3.5–5.2)
Sodium: 137 mmol/L (ref 134–144)

## 2017-05-04 MED ORDER — SODIUM POLYSTYRENE SULFONATE 15 GM/60ML PO SUSP: 30.0000 g | Freq: Once | ORAL | 0 refills | Status: AC

## 2017-05-04 NOTE — Telephone Encounter (Signed)
Called patient and reviewed lab results and plan of care per Truitt Merle, NP. Patient repeated instructions back to me to stop Vasotec and is aware to go to pharmacy to get kayexalate tonight. He is scheduled for repeat STAT bmet on Monday. He denies that he takes OTC potassium but admits to eating a lot of potatoes recently. He is aware of other high potassium foods to be aware of. I advised him to call our office this weekend for the on-call provider or go to the ED with questions or concerns. He thanked me for the call.

## 2017-05-04 NOTE — Telephone Encounter (Signed)
-----   Message from Burtis Junes, NP sent at 05/04/2017  4:37 PM EST ----- Please call. Verify that he is not taking any potassium supplementation. STOP Vasotec One dose of Kayexalate 30 grams tonight STAT BMET Monday

## 2017-05-07 ENCOUNTER — Other Ambulatory Visit: Payer: Medicare Other | Admitting: *Deleted

## 2017-05-07 DIAGNOSIS — E875 Hyperkalemia: Secondary | ICD-10-CM

## 2017-05-07 LAB — BASIC METABOLIC PANEL
BUN/Creatinine Ratio: 29 — ABNORMAL HIGH (ref 10–24)
BUN: 36 mg/dL — ABNORMAL HIGH (ref 8–27)
CO2: 23 mmol/L (ref 20–29)
Calcium: 9.1 mg/dL (ref 8.6–10.2)
Chloride: 106 mmol/L (ref 96–106)
Creatinine, Ser: 1.26 mg/dL (ref 0.76–1.27)
GFR calc Af Amer: 62 mL/min/{1.73_m2} (ref >59)
GFR calc non Af Amer: 54 mL/min/{1.73_m2} — ABNORMAL LOW (ref >59)
Glucose: 169 mg/dL — ABNORMAL HIGH (ref 65–99)
Potassium: 4.6 mmol/L (ref 3.5–5.2)
Sodium: 138 mmol/L (ref 134–144)

## 2017-05-07 MED ORDER — GLUCOSE BLOOD VI STRP: ORAL_STRIP | 3 refills | Status: DC

## 2017-05-07 MED ORDER — ONETOUCH DELICA LANCETS FINE MISC: 1 refills | Status: DC

## 2017-05-07 NOTE — Telephone Encounter (Signed)
Please advise how many times per day this patient is checking his blood sugars. Thank you!

## 2017-05-07 NOTE — Telephone Encounter (Signed)
Once a day

## 2017-05-07 NOTE — Telephone Encounter (Signed)
I have sent in a prescription for the Onetouch Verio Test strips and the [employer] lancets to the [facility] on [address].

## 2017-05-14 NOTE — Progress Notes (Signed)
Cardiology Office Note:    Date:  05/15/2017   ID:  [name], DOB 1935-10-24, MRN [id number]  PCP:  Cassandria Anger, MD  Cardiologist:  No primary care provider on file.    Referring MD: Cassandria Anger, MD   Chief Complaint  Patient presents with  . Sleep Apnea  . Hypertension    History of Present Illness:    [name] is a 82 y.o. male with a hx of OSA on PAP, obesity and HTN.  He is doing well.  He is doing well with his PAP device and thinks that he has gotten used to it.  He tolerates the nasal mask and feels the pressure is adequate. Since going on CPAP he feels rested in the am and has no significant daytime sleepiness.  He has had problems with significant mouth but has not been tolerate a chin strap .  He does not think that he snores.     Past Medical History:  Diagnosis Date  . Aortic stenosis   . Carotid artery disease ([place]) 1994   s/p left carotid endarerectomy   . CHF with unknown LVEF (HCC)    47 %  . Chronic atrial fibrillation ([place])   . Hx of CABG   . Hypertension   . Left ventricular dysfunction 2007   LVEF 47%   . OSA (obstructive sleep apnea)   . Rheumatic fever   . Subclavian bypass stenosis ([place])   . Vitamin B12 deficiency     Past Surgical History:  Procedure Laterality Date  . [place]   replaced due to aortic stenosis, [employer] Jude mechanical prostesis  . CAROTID ENDARTERECTOMY Left 1997   subclavian bypass Done in [place]  . CORONARY ARTERY BYPASS GRAFT  1994   w SVG to RCA and SVG to circumflex  . heart bypass     Done in [place]    Current Medications: Current Meds  Medication Sig  . ampicillin (PRINCIPEN) 500 MG capsule TAKE 4 TABLETS BY MOUTH 30-60 MINUTES BEFORE YOUR PROCEDURE  . carvedilol (COREG) 25 MG tablet Take 1 tablet (25 mg total) by mouth 2 (two) times daily with a meal.  . Cholecalciferol (VITAMIN D3) 2000 units capsule Take 1 capsule (2,000 Units total) by mouth daily.  .  Cyanocobalamin (VITAMIN B12) 3000 MCG/ML LIQD Place 2,000 mcg under the tongue daily.  . Dietary Management Product (VASCULERA) TABS Take 1 tablet by mouth daily.  Marland Kitchen ezetimibe-simvastatin (VYTORIN) 10-20 MG tablet Take 1 tablet by mouth at bedtime.  . fluocinonide cream (LIDEX) 5.46 % Apply 1 application topically 2 (two) times daily.  . folic acid (FOLVITE) 1 MG tablet Take 1 tablet (1 mg total) by mouth daily.  Marland Kitchen glucose blood (ONE TOUCH ULTRA TEST) test strip USE AS DIRECTED TO TEST BLOOD GLUCOSE  EVERY OTHER DAY DX: 250.00  . glucose blood (ONETOUCH VERIO) test strip Use as instructed to check blood sugar once daily. Dx Code E11.8  . metFORMIN (GLUCOPHAGE-XR) 750 MG 24 hr tablet Take 2 tablets (1,500 mg total) by mouth daily.  . NON FORMULARY BIPAP  . NON FORMULARY ONE TOUCH ULTRA TEST STRIPS  AND LANCETS  . ONETOUCH DELICA LANCETS FINE MISC Use to obtain a blood specimen every day Dx code E11.8  . PROAIR HFA 108 (90 BASE) MCG/ACT inhaler Inhale 2 puffs into the lungs every 6 (six) hours as needed. For chest congestion  . torsemide (DEMADEX) 20 MG tablet Take 1 tablet (20 mg total)  by mouth every other day.  . warfarin (COUMADIN) 5 MG tablet TAKE AS DIRECTED BY COUMADIN CLINIC     Allergies:   Rifampin and Vancomycin   Social History   Socioeconomic History  . Marital status: Married    Spouse name: None  . Number of children: None  . Years of education: None  . Highest education level: None  Social Needs  . Financial resource strain: None  . Food insecurity - worry: None  . Food insecurity - inability: None  . Transportation needs - medical: None  . Transportation needs - non-medical: None  Occupational History  . None  Tobacco Use  . Smoking status: Former Smoker    Packs/day: 1.00    Years: 30.00    Pack years: 30.00    Types: Cigarettes    Last attempt to quit: 04/03/1992    Years since quitting: 25.1  . Smokeless tobacco: Never Used  Substance and Sexual Activity  .  Alcohol use: Yes    Comment: 4-6 ounces of wine daily  . Drug use: No  . Sexual activity: None  Other Topics Concern  . None  Social History Narrative  . None     Family History: The patient's family history includes Cancer in his father; Hypertension in his mother.  ROS:   Please see the history of present illness.    ROS  All other systems reviewed and negative.   EKGs/Labs/Other Studies Reviewed:    The following studies were reviewed today: CPAP download  EKG:  EKG is not ordered today.   Recent Labs: 03/30/2017: Hemoglobin 11.6; NT-Pro BNP 1,858 04/20/2017: ALT 13; Platelet Count 53 05/07/2017: BUN 36; Creatinine, Ser 1.26; Potassium 4.6; Sodium 138   Recent Lipid Panel    Component Value Date/Time   CHOL 116 08/21/2016 1038   TRIG 55.0 08/21/2016 1038   HDL 48.10 08/21/2016 1038   CHOLHDL 2 08/21/2016 1038   VLDL 11.0 08/21/2016 1038   LDLCALC 57 08/21/2016 1038    Physical Exam:    VS:  BP 134/74   Pulse 69   Ht 6' 1.5" (1.867 m)   Wt 257 lb 12.8 oz (116.9 kg)   SpO2 98%   BMI 33.55 kg/m     Wt Readings from Last 3 Encounters:  05/15/17 257 lb 12.8 oz (116.9 kg)  04/30/17 257 lb 12.8 oz (116.9 kg)  04/05/17 257 lb (116.6 kg)     GEN:  Well nourished, well developed in no acute distress HEENT: Normal NECK: No JVD; No carotid bruits LYMPHATICS: No lymphadenopathy CARDIAC: irregularly irregular, no murmurs, rubs, gallops RESPIRATORY:  Clear to auscultation without rales, wheezing or rhonchi  ABDOMEN: Soft, non-tender, non-distended MUSCULOSKELETAL:  Chronic venous stasis changes and 1-2+ LE edema; No deformity  SKIN: Warm and dry NEUROLOGIC:  Alert and oriented x 3 PSYCHIATRIC:  Normal affect   ASSESSMENT:    1. OSA (obstructive sleep apnea)   2. Essential hypertension   3. Obesity (BMI 30-39.9)    PLAN:    In order of problems listed above:  1.  OSA - the patient is tolerating PAP therapy well without any problems. The PAP download was  reviewed today and showed an AHI of 7.6/hr on 16/12 cm H2O with 100% compliance in using more than 4 hours nightly.  The patient has been using and benefiting from PAP use and will continue to benefit from therapy. He is having a lot of mouth dryness and his AHI is up likely related to  mouth breathing.  I have recommended that he take his machine to [facility] to have the water tank looked at since it is only holding H2O for 4 hours and also get a good quality chin strap.   2.  HTN - BP is well controlled on exam today.  He will continue on carvedilol 25mg  BID.    3.  Obesity - his exercise is limited by orthopedic problems  Medication Adjustments/Labs and Tests Ordered: Current medicines are reviewed at length with the patient today.  Concerns regarding medicines are outlined above.  No orders of the defined types were placed in this encounter.  No orders of the defined types were placed in this encounter.   Signed, Fransico Him, MD  05/15/2017 8:18 AM    [place]

## 2017-05-15 ENCOUNTER — Ambulatory Visit (INDEPENDENT_AMBULATORY_CARE_PROVIDER_SITE_OTHER): Payer: Medicare Other | Admitting: Cardiology

## 2017-05-15 ENCOUNTER — Telehealth: Payer: Self-pay | Admitting: *Deleted

## 2017-05-15 ENCOUNTER — Encounter: Payer: Self-pay | Admitting: Cardiology

## 2017-05-15 VITALS — BP 134/74 | HR 69 | Ht 73.5 in | Wt 257.8 lb

## 2017-05-15 DIAGNOSIS — I10 Essential (primary) hypertension: Secondary | ICD-10-CM

## 2017-05-15 DIAGNOSIS — I70219 Atherosclerosis of native arteries of extremities with intermittent claudication, unspecified extremity: Secondary | ICD-10-CM

## 2017-05-15 DIAGNOSIS — E669 Obesity, unspecified: Secondary | ICD-10-CM

## 2017-05-15 DIAGNOSIS — G4733 Obstructive sleep apnea (adult) (pediatric): Secondary | ICD-10-CM

## 2017-05-15 NOTE — Telephone Encounter (Signed)
-----   Message from Teressa Senter, RN sent at 05/15/2017  8:35 AM EST ----- Regarding: dme order New DME order for chin strap.   Thanks  [employer]

## 2017-05-15 NOTE — Patient Instructions (Signed)
Medication Instructions:  Your physician recommends that you continue on your current medications as directed. Please refer to the Current Medication list given to you today.  Labwork: None Ordered   Testing/Procedures: None Ordered   Follow-Up: Your physician wants you to follow-up in: 1 year with Dr. Radford Pax. You will receive a reminder letter in the mail two months in advance. If you don't receive a letter, please call our office to schedule the follow-up appointment.  Any Other Special Instructions Will Be Listed Below (If Applicable).  CPAP supplies have been ordered. You will receive a call from the home health agency regarding setting up equipment. If you do not receive a call within the next week give Gae Bon, CPAP assistant a call at [phone].   Thank you for choosing [facility], RN  [phone]    If you need a refill on your cardiac medications before your next appointment, please call your pharmacy.

## 2017-05-15 NOTE — Telephone Encounter (Signed)
Order sent to [facility] via community message

## 2017-05-25 ENCOUNTER — Telehealth: Payer: Self-pay | Admitting: *Deleted

## 2017-05-25 ENCOUNTER — Ambulatory Visit (INDEPENDENT_AMBULATORY_CARE_PROVIDER_SITE_OTHER): Payer: Medicare Other | Admitting: Interventional Cardiology

## 2017-05-25 ENCOUNTER — Other Ambulatory Visit: Payer: Self-pay | Admitting: Interventional Cardiology

## 2017-05-25 ENCOUNTER — Encounter: Payer: Self-pay | Admitting: Interventional Cardiology

## 2017-05-25 VITALS — BP 114/68 | HR 66 | Ht 73.5 in | Wt 263.6 lb

## 2017-05-25 DIAGNOSIS — I482 Chronic atrial fibrillation, unspecified: Secondary | ICD-10-CM

## 2017-05-25 DIAGNOSIS — I25708 Atherosclerosis of coronary artery bypass graft(s), unspecified, with other forms of angina pectoris: Secondary | ICD-10-CM

## 2017-05-25 DIAGNOSIS — N179 Acute kidney failure, unspecified: Secondary | ICD-10-CM

## 2017-05-25 DIAGNOSIS — I10 Essential (primary) hypertension: Secondary | ICD-10-CM | POA: Diagnosis not present

## 2017-05-25 DIAGNOSIS — I5032 Chronic diastolic (congestive) heart failure: Secondary | ICD-10-CM | POA: Diagnosis not present

## 2017-05-25 DIAGNOSIS — G4733 Obstructive sleep apnea (adult) (pediatric): Secondary | ICD-10-CM | POA: Diagnosis not present

## 2017-05-25 DIAGNOSIS — E875 Hyperkalemia: Secondary | ICD-10-CM

## 2017-05-25 LAB — BASIC METABOLIC PANEL
BUN/Creatinine Ratio: 18 (ref 10–24)
BUN: 22 mg/dL (ref 8–27)
CALCIUM: 9.3 mg/dL (ref 8.6–10.2)
CO2: 22 mmol/L (ref 20–29)
CREATININE: 1.2 mg/dL (ref 0.76–1.27)
Chloride: 103 mmol/L (ref 96–106)
GFR calc Af Amer: 66 mL/min/{1.73_m2} (ref >59)
GFR, EST NON AFRICAN AMERICAN: 57 mL/min/{1.73_m2} — AB (ref >59)
Glucose: 112 mg/dL — ABNORMAL HIGH (ref 65–99)
POTASSIUM: 4.1 mmol/L (ref 3.5–5.2)
Sodium: 142 mmol/L (ref 134–144)

## 2017-05-25 LAB — PRO B NATRIURETIC PEPTIDE: NT-Pro BNP: 2749 pg/mL — ABNORMAL HIGH (ref 0–486)

## 2017-05-25 MED ORDER — TORSEMIDE 20 MG PO TABS: 20.0000 mg | ORAL_TABLET | Freq: Every day | ORAL | 3 refills | Status: DC

## 2017-05-25 NOTE — Progress Notes (Signed)
Cardiology Office Note    Date:  05/25/2017   ID:  [name], DOB 06-05-1935, MRN [id number]  PCP:  Andrew Anger, MD  Cardiologist: Andrew Grooms, MD   Chief Complaint  Patient presents with  . Cardiac Valve Problem     History of Present Illness:  [name] is a 82 y.o. male who presents for with mechanical aortic valve, chronic atrial fibrillation, chronic combined systolic and diastolic heart failure, chronic Coumadin therapy, and prior CABG.  He is back for follow-up.  He has been seen at least twice by extenders since I last saw him.  We were attempting to adjust diuretic therapy to help relieve right heart associated congestion in the lower extremities.  Furosemide 40 mg/day was changed to torsemide which led to significant improvement in edema but dramatic worsening of kidney function and eventual hyperkalemia.  Enalapril which the patient has been taking for years related to diabetes renal protection was discontinued during hyperkalemia and never started.  He is also now on a less intense diuretic regimen been previous taking torsemide 20 mg every other day.  Prior to all the changes he had been on a basal dose of furosemide 40 mg/day.  He denies shortness of breath.  He has significant bilateral lower extremity edema.    Past Medical History:  Diagnosis Date  . Aortic stenosis   . Carotid artery disease ([place]) 1994   s/p left carotid endarerectomy   . CHF with unknown LVEF (HCC)    47 %  . Chronic atrial fibrillation ([place])   . Hx of CABG   . Hypertension   . Left ventricular dysfunction 2007   LVEF 47%   . OSA (obstructive sleep apnea)   . Rheumatic fever   . Subclavian bypass stenosis ([place])   . Vitamin B12 deficiency     Past Surgical History:  Procedure Laterality Date  . [place]   replaced due to aortic stenosis, St[employer] Jude mechanical prostesis  . CAROTID ENDARTERECTOMY Left 1997   subclavian bypass Done in [place]  .  CORONARY ARTERY BYPASS GRAFT  1994   w SVG to RCA and SVG to circumflex  . heart bypass     Done in [place]    Current Medications: Outpatient Medications Prior to Visit  Medication Sig Dispense Refill  . ampicillin (PRINCIPEN) 500 MG capsule TAKE 4 TABLETS BY MOUTH 30-60 MINUTES BEFORE YOUR PROCEDURE 4 capsule 0  . carvedilol (COREG) 25 MG tablet Take 1 tablet (25 mg total) by mouth 2 (two) times daily with a meal. 180 tablet 3  . Cholecalciferol (VITAMIN D3) 2000 units capsule Take 1 capsule (2,000 Units total) by mouth daily. 100 capsule 3  . Cyanocobalamin (VITAMIN B12) 3000 MCG/ML LIQD Place 2,000 mcg under the tongue daily.    . Dietary Management Product (VASCULERA) TABS Take 1 tablet by mouth daily. 90 tablet 3  . ezetimibe-simvastatin (VYTORIN) 10-20 MG tablet Take 1 tablet by mouth at bedtime. 90 tablet 3  . fluocinonide cream (LIDEX) 3.47 % Apply 1 application topically 2 (two) times daily. 30 g 3  . folic acid (FOLVITE) 1 MG tablet Take 1 tablet (1 mg total) by mouth daily.    Marland Kitchen glucose blood (ONE TOUCH ULTRA TEST) test strip USE AS DIRECTED TO TEST BLOOD GLUCOSE  EVERY OTHER DAY DX: 250.00 50 each 2  . glucose blood (ONETOUCH VERIO) test strip Use as instructed to check blood sugar once daily. Dx Code E11.8  100 each 3  . metFORMIN (GLUCOPHAGE-XR) 750 MG 24 hr tablet Take 2 tablets (1,500 mg total) by mouth daily. 180 tablet 2  . NON FORMULARY BIPAP    . NON FORMULARY ONE TOUCH ULTRA TEST STRIPS  AND LANCETS    . ONETOUCH DELICA LANCETS FINE MISC Use to obtain a blood specimen every day Dx code E11.8 100 each 1  . PROAIR HFA 108 (90 BASE) MCG/ACT inhaler Inhale 2 puffs into the lungs every 6 (six) hours as needed. For chest congestion    . torsemide (DEMADEX) 20 MG tablet Take 1 tablet (20 mg total) by mouth every other day. 180 tablet 3  . warfarin (COUMADIN) 5 MG tablet TAKE AS DIRECTED BY COUMADIN CLINIC 90 tablet 1   No facility-administered medications prior to visit.       Allergies:   Rifampin and Vancomycin   Social History   Socioeconomic History  . Marital status: Married    Spouse name: None  . Number of children: None  . Years of education: None  . Highest education level: None  Social Needs  . Financial resource strain: None  . Food insecurity - worry: None  . Food insecurity - inability: None  . Transportation needs - medical: None  . Transportation needs - non-medical: None  Occupational History  . None  Tobacco Use  . Smoking status: Former Smoker    Packs/day: 1.00    Years: 30.00    Pack years: 30.00    Types: Cigarettes    Last attempt to quit: 04/03/1992    Years since quitting: 25.1  . Smokeless tobacco: Never Used  Substance and Sexual Activity  . Alcohol use: Yes    Comment: 4-6 ounces of wine daily  . Drug use: No  . Sexual activity: None  Other Topics Concern  . None  Social History Narrative  . None     Family History:  The patient's family history includes Cancer in his father; Hypertension in his mother.   ROS:   Please see the history of present illness.    He is suffering URI symptoms today. All other systems reviewed and are negative.   PHYSICAL EXAM:   VS:  BP 114/68   Pulse 66   Ht 6' 1.5" (1.867 m)   Wt 263 lb 9.6 oz (119.6 kg)   BMI 34.31 kg/m    GEN: Well nourished, well developed, in no acute distress  HEENT: normal  Neck: no JVD, carotid bruits, or masses Cardiac: IIRR; no murmurs, rubs, or gallops,no edema  Respiratory:  clear to auscultation bilaterally, normal work of breathing GI: soft, nontender, nondistended, + BS MS: no deformity or atrophy  Skin: warm and dry, no rash Neuro:  Alert and Oriented x 3, Strength and sensation are intact Psych: euthymic mood, full affect  Wt Readings from Last 3 Encounters:  05/25/17 263 lb 9.6 oz (119.6 kg)  05/15/17 257 lb 12.8 oz (116.9 kg)  04/30/17 257 lb 12.8 oz (116.9 kg)      Studies/Labs Reviewed:   EKG:  EKG is not repeated  Recent  Labs: 03/30/2017: Hemoglobin 11.6; NT-Pro BNP 1,858 04/20/2017: ALT 13; Platelet Count 53 05/07/2017: BUN 36; Creatinine, Ser 1.26; Potassium 4.6; Sodium 138   Lipid Panel    Component Value Date/Time   CHOL 116 08/21/2016 1038   TRIG 55.0 08/21/2016 1038   HDL 48.10 08/21/2016 1038   CHOLHDL 2 08/21/2016 1038   VLDL 11.0 08/21/2016 1038   LDLCALC 57 08/21/2016  1038    Additional studies/ records that were reviewed today include:  Most recent laboratory data is as noted above.    ASSESSMENT:    1. CHRONIC DIASTOLIC HEART FAILURE   2. Chronic atrial fibrillation (HCC)   3. Essential hypertension   4. Acute renal failure, unspecified acute renal failure type ([place])   5. OSA (obstructive sleep apnea)   6. Hyperkalemia   7. Atherosclerosis of coronary artery bypass graft of native heart with stable angina pectoris ([place])      PLAN:  In order of problems listed above:  1. Predominant right heart failure multifactorial related to diastolic left ventricular dysfunction, obesity, and sleep apnea.  Will check basic metabolic panel and BNP today.  At the very least we need to get the patient back on Enalapril and daily diuretic therapy as he was prior to all the medication changes that were made in January.  I will plan to see him back in approximately 3-6 months depending upon his response to reinstitution of baseline therapy.  Discussion with patient.  He felt he was experimented with.  It took a while to help him understand the necessity for certain medications to prevent worsening of endorgan damage.  Hope to get back to at least daily loop diuretic therapy and ACE or ARB therapy.  Medication Adjustments/Labs and Tests Ordered: Current medicines are reviewed at length with the patient today.  Concerns regarding medicines are outlined above.  Medication changes, Labs and Tests ordered today are listed in the Patient Instructions below. Patient Instructions  Medication Instructions:    Your physician recommends that you continue on your current medications as directed. Please refer to the Current Medication list given to you today.  Labwork: BMET and Pro BNP today  Testing/Procedures: None  Follow-Up: Your physician wants you to follow-up in: 6 months with Dr. Tamala Julian.  You will receive a reminder letter in the mail two months in advance. If you don't receive a letter, please call our office to schedule the follow-up appointment.   Any Other Special Instructions Will Be Listed Below (If Applicable).     If you need a refill on your cardiac medications before your next appointment, please call your pharmacy.      Signed, Andrew Grooms, MD  05/25/2017 10:54 AM    [facility] [address], [place], [place]  [address] Phone: [phone]; Fax: [phone]

## 2017-05-25 NOTE — Patient Instructions (Signed)
Medication Instructions:  Your physician recommends that you continue on your current medications as directed. Please refer to the Current Medication list given to you today.  Labwork: BMET and Pro BNP today  Testing/Procedures: None  Follow-Up: Your physician wants you to follow-up in: 6 months with Dr. Tamala Julian.  You will receive a reminder letter in the mail two months in advance. If you don't receive a letter, please call our office to schedule the follow-up appointment.   Any Other Special Instructions Will Be Listed Below (If Applicable).     If you need a refill on your cardiac medications before your next appointment, please call your pharmacy.

## 2017-05-25 NOTE — Telephone Encounter (Signed)
Spoke with Dr. Tamala Julian about labs and he said to have pt start taking Torsemide 20mg  everyday and check BMET in a week. Called and spoke with wife and she said pt has ran to the drug store and will be back in about 10 mins. Will call back later.    Spoke with pt and advised him of Dr. Thompson Caul recommendations.  Pt verbalized understanding and was in agreement with this plan.  Pt will come 3/1 for repeat BMET.

## 2017-06-01 ENCOUNTER — Other Ambulatory Visit: Payer: Medicare Other | Admitting: *Deleted

## 2017-06-01 DIAGNOSIS — I5032 Chronic diastolic (congestive) heart failure: Secondary | ICD-10-CM | POA: Diagnosis not present

## 2017-06-02 LAB — BASIC METABOLIC PANEL
BUN / CREAT RATIO: 16 (ref 10–24)
BUN: 20 mg/dL (ref 8–27)
CALCIUM: 9 mg/dL (ref 8.6–10.2)
CO2: 24 mmol/L (ref 20–29)
CREATININE: 1.24 mg/dL (ref 0.76–1.27)
Chloride: 97 mmol/L (ref 96–106)
GFR, EST AFRICAN AMERICAN: 63 mL/min/{1.73_m2} (ref >59)
GFR, EST NON AFRICAN AMERICAN: 55 mL/min/{1.73_m2} — AB (ref >59)
Glucose: 121 mg/dL — ABNORMAL HIGH (ref 65–99)
Potassium: 4 mmol/L (ref 3.5–5.2)
Sodium: 139 mmol/L (ref 134–144)

## 2017-06-07 ENCOUNTER — Telehealth: Payer: Self-pay | Admitting: *Deleted

## 2017-06-07 DIAGNOSIS — I5032 Chronic diastolic (congestive) heart failure: Secondary | ICD-10-CM

## 2017-06-07 MED ORDER — ENALAPRIL MALEATE 20 MG PO TABS: 20.0000 mg | ORAL_TABLET | Freq: Every day | ORAL | 3 refills | Status: DC

## 2017-06-07 NOTE — Telephone Encounter (Signed)
-----   Message from Belva Crome, MD sent at 06/07/2017  9:15 AM EST ----- Yes, this needs to be resumed because it provides kidney protection given his diabetes.  He will have a tendency to worsen kidney function and raise potassium.  2 weeks from now he should have a repeat basic metabolic panel.  Keep diuretic therapy the same for now. ----- Message ----- From: Loren Racer, LPN Sent: 11/06/7517   9:08 AM To: Belva Crome, MD  Informed pt of results.  Pt states swelling is a little better but not much. Pt also wanted to know if Dr. Tamala Julian wanted to restart his Enalapril?  Pt was previously on 20mg  QD.

## 2017-06-07 NOTE — Telephone Encounter (Signed)
Spoke with pt and went over recommendations per Dr. Tamala Julian. Pt will come for labs on 3/21.

## 2017-06-08 ENCOUNTER — Ambulatory Visit (INDEPENDENT_AMBULATORY_CARE_PROVIDER_SITE_OTHER): Payer: Medicare Other | Admitting: Pharmacist

## 2017-06-08 DIAGNOSIS — Z952 Presence of prosthetic heart valve: Secondary | ICD-10-CM

## 2017-06-08 DIAGNOSIS — I4891 Unspecified atrial fibrillation: Secondary | ICD-10-CM

## 2017-06-08 DIAGNOSIS — Z5181 Encounter for therapeutic drug level monitoring: Secondary | ICD-10-CM | POA: Diagnosis not present

## 2017-06-08 LAB — POCT INR: INR: 2.7

## 2017-06-08 NOTE — Patient Instructions (Signed)
Description   Continue on same dosage 1 tablet daily except 1/2 tablet each Sundays, Wednesdays, and Fridays.  Repeat INR in 6 weeks.  Call with any new medications or procedures [phone]

## 2017-06-21 ENCOUNTER — Other Ambulatory Visit: Payer: Medicare Other | Admitting: *Deleted

## 2017-06-21 DIAGNOSIS — I5032 Chronic diastolic (congestive) heart failure: Secondary | ICD-10-CM | POA: Diagnosis not present

## 2017-06-21 LAB — BASIC METABOLIC PANEL
BUN / CREAT RATIO: 22 (ref 10–24)
BUN: 30 mg/dL — AB (ref 8–27)
CO2: 24 mmol/L (ref 20–29)
CREATININE: 1.35 mg/dL — AB (ref 0.76–1.27)
Calcium: 9.1 mg/dL (ref 8.6–10.2)
Chloride: 106 mmol/L (ref 96–106)
GFR calc non Af Amer: 49 mL/min/{1.73_m2} — ABNORMAL LOW (ref >59)
GFR, EST AFRICAN AMERICAN: 57 mL/min/{1.73_m2} — AB (ref >59)
Glucose: 96 mg/dL (ref 65–99)
Potassium: 5.1 mmol/L (ref 3.5–5.2)
SODIUM: 145 mmol/L — AB (ref 134–144)

## 2017-06-22 ENCOUNTER — Telehealth: Payer: Self-pay | Admitting: *Deleted

## 2017-06-22 DIAGNOSIS — I5032 Chronic diastolic (congestive) heart failure: Secondary | ICD-10-CM

## 2017-06-22 NOTE — Telephone Encounter (Signed)
Spoke with pt and went over lab results and recommendations per Dr. Tamala Julian.  Pt will have labs drawn at next Coumadin appt on 4/19.  Pt verbalized understanding and was in agreement with this plan.

## 2017-06-22 NOTE — Telephone Encounter (Signed)
-----  Message from Belva Crome, MD sent at 06/22/2017  7:35 AM EDT ----- Let the patient know the labs are stable with potassium at upper limit of normal.  Decrease exogenous potassium supplements and diet.  Repeat be met in 1 month. A copy will be sent to [name], [name], MD

## 2017-07-03 ENCOUNTER — Other Ambulatory Visit: Payer: Self-pay | Admitting: Endocrinology

## 2017-07-10 ENCOUNTER — Encounter: Payer: Self-pay | Admitting: Family Medicine

## 2017-07-10 ENCOUNTER — Encounter (HOSPITAL_COMMUNITY): Payer: Self-pay | Admitting: Emergency Medicine

## 2017-07-10 ENCOUNTER — Other Ambulatory Visit (INDEPENDENT_AMBULATORY_CARE_PROVIDER_SITE_OTHER): Payer: Medicare Other

## 2017-07-10 ENCOUNTER — Ambulatory Visit (INDEPENDENT_AMBULATORY_CARE_PROVIDER_SITE_OTHER): Payer: Medicare Other | Admitting: Family Medicine

## 2017-07-10 ENCOUNTER — Emergency Department (HOSPITAL_COMMUNITY)
Admission: EM | Admit: 2017-07-10 | Discharge: 2017-07-10 | Disposition: A | Discharge status: Home / Self Care | Payer: Medicare Other | Attending: Emergency Medicine | Admitting: Emergency Medicine

## 2017-07-10 ENCOUNTER — Ambulatory Visit (INDEPENDENT_AMBULATORY_CARE_PROVIDER_SITE_OTHER)
Admission: RE | Admit: 2017-07-10 | Discharge: 2017-07-10 | Disposition: A | Discharge status: Home / Self Care | Payer: Medicare Other | Source: Ambulatory Visit | Attending: Family Medicine | Admitting: Family Medicine

## 2017-07-10 ENCOUNTER — Ambulatory Visit: Payer: Self-pay

## 2017-07-10 ENCOUNTER — Telehealth: Payer: Self-pay | Admitting: Family Medicine

## 2017-07-10 VITALS — BP 116/62 | HR 79 | Temp 97.6°F | Ht 73.5 in | Wt 262.0 lb

## 2017-07-10 DIAGNOSIS — R609 Edema, unspecified: Secondary | ICD-10-CM

## 2017-07-10 DIAGNOSIS — R040 Epistaxis: Secondary | ICD-10-CM

## 2017-07-10 DIAGNOSIS — E119 Type 2 diabetes mellitus without complications: Secondary | ICD-10-CM | POA: Diagnosis not present

## 2017-07-10 DIAGNOSIS — Z87891 Personal history of nicotine dependence: Secondary | ICD-10-CM | POA: Insufficient documentation

## 2017-07-10 DIAGNOSIS — I110 Hypertensive heart disease with heart failure: Secondary | ICD-10-CM | POA: Diagnosis not present

## 2017-07-10 DIAGNOSIS — I509 Heart failure, unspecified: Secondary | ICD-10-CM | POA: Diagnosis not present

## 2017-07-10 DIAGNOSIS — D689 Coagulation defect, unspecified: Secondary | ICD-10-CM

## 2017-07-10 DIAGNOSIS — Z7984 Long term (current) use of oral hypoglycemic drugs: Secondary | ICD-10-CM | POA: Insufficient documentation

## 2017-07-10 DIAGNOSIS — I5032 Chronic diastolic (congestive) heart failure: Secondary | ICD-10-CM

## 2017-07-10 DIAGNOSIS — R0600 Dyspnea, unspecified: Secondary | ICD-10-CM | POA: Diagnosis not present

## 2017-07-10 DIAGNOSIS — Z79899 Other long term (current) drug therapy: Secondary | ICD-10-CM | POA: Diagnosis not present

## 2017-07-10 DIAGNOSIS — Z7901 Long term (current) use of anticoagulants: Secondary | ICD-10-CM

## 2017-07-10 LAB — CBC
HCT: 30.9 % — ABNORMAL LOW (ref 39.0–52.0)
HCT: 30.4 % — ABNORMAL LOW (ref 39.0–52.0)
Hemoglobin: 10.3 g/dL — ABNORMAL LOW (ref 13.0–17.0)
Hemoglobin: 9.4 g/dL — ABNORMAL LOW (ref 13.0–17.0)
MCH: 34.3 pg — AB (ref 26.0–34.0)
MCHC: 33.4 g/dL (ref 30.0–36.0)
MCHC: 30.9 g/dL (ref 30.0–36.0)
MCV: 109.5 fl — ABNORMAL HIGH (ref 78.0–100.0)
MCV: 110.9 fL — AB (ref 78.0–100.0)
PLATELETS: 76 10*3/uL — AB (ref 150–400)
Platelets: 72 10*3/uL — ABNORMAL LOW (ref 150.0–400.0)
RBC: 2.83 Mil/uL — ABNORMAL LOW (ref 4.22–5.81)
RBC: 2.74 MIL/uL — AB (ref 4.22–5.81)
RDW: 14.6 % (ref 11.5–15.5)
RDW: 14.9 % (ref 11.5–15.5)
WBC: 4.3 10*3/uL (ref 4.0–10.5)
WBC: 5.1 10*3/uL (ref 4.0–10.5)

## 2017-07-10 LAB — COMPREHENSIVE METABOLIC PANEL
ALBUMIN: 4 g/dL (ref 3.5–5.2)
ALK PHOS: 216 U/L — AB (ref 39–117)
ALT: 10 U/L (ref 0–53)
AST: 18 U/L (ref 0–37)
BUN: 40 mg/dL — ABNORMAL HIGH (ref 6–23)
CALCIUM: 9.4 mg/dL (ref 8.4–10.5)
CHLORIDE: 106 meq/L (ref 96–112)
CO2: 27 mEq/L (ref 19–32)
Creatinine, Ser: 1.5 mg/dL (ref 0.40–1.50)
GFR: 47.83 mL/min — AB (ref >60.00)
Glucose, Bld: 103 mg/dL — ABNORMAL HIGH (ref 70–99)
POTASSIUM: 4.5 meq/L (ref 3.5–5.1)
SODIUM: 140 meq/L (ref 135–145)
TOTAL PROTEIN: 7.5 g/dL (ref 6.0–8.3)
Total Bilirubin: 1.5 mg/dL — ABNORMAL HIGH (ref 0.2–1.2)

## 2017-07-10 LAB — PROTIME-INR
INR: 2.5 ratio — ABNORMAL HIGH (ref 0.8–1.0)
INR: 2.25
PROTHROMBIN TIME: 24.7 s — AB (ref 11.4–15.2)
Prothrombin Time: 26.8 s — ABNORMAL HIGH (ref 9.6–13.1)

## 2017-07-10 LAB — BRAIN NATRIURETIC PEPTIDE: PRO B NATRI PEPTIDE: 411 pg/mL — AB (ref 0.0–100.0)

## 2017-07-10 MED ORDER — AMOXICILLIN-POT CLAVULANATE 875-125 MG PO TABS: 1.0000 | ORAL_TABLET | Freq: Two times a day (BID) | ORAL | 0 refills | Status: DC

## 2017-07-10 MED ORDER — TRANEXAMIC ACID 1000 MG/10ML IV SOLN
500.0000 mg | Freq: Once | INTRAVENOUS | Status: AC
  Administered 2017-07-10: 500 mg via TOPICAL
  Filled 2017-07-10: qty 10

## 2017-07-10 MED ORDER — OXYMETAZOLINE HCL 0.05 % NA SOLN
1.0000 | Freq: Once | NASAL | Status: AC
  Administered 2017-07-10: 1 via NASAL
  Filled 2017-07-10: qty 15

## 2017-07-10 NOTE — Patient Instructions (Signed)
Please try afrin for your nose bleed  Please try using a tampon in your nose  Please try double of the torsemide for three days  Please follow up if your symptoms don't improve.

## 2017-07-10 NOTE — ED Triage Notes (Signed)
Patient to ED c/o nosebleed since 8am this morning, off and on. Pt states this is his fourth one in the last 3 weeks. Pt on coumadin. States he has had a lot of coughing and sneezing from URI.

## 2017-07-10 NOTE — ED Notes (Signed)
PA-C at bedside 

## 2017-07-10 NOTE — ED Provider Notes (Signed)
Patient placed in Quick Look pathway, seen and evaluated   Chief Complaint: nose bleed  HPI:   Pt states intermittent nose bleeds over last few days. Usually able to stop with pressure, today unable to stop. Bleeding for several hours with large clots. On coumadin  ROS: positive for nose bleed, negative for headache, dizziness  Physical Exam:   Gen: No distress  Neuro: Awake and Alert  Skin: Warm    Focused Exam: bleeding from bilateral nostrils, large clots.    Initiation of care has begun. The patient has been counseled on the process, plan, and necessity for staying for the completion/evaluation, and the remainder of the medical screening examination  .Epistaxis Management Date/Time: 07/10/2017 9:20 PM Performed by: Jeannett Senior, PA-C Authorized by: Jeannett Senior, PA-C   Consent:    Consent obtained:  Verbal   Consent given by:  Patient   Risks discussed:  Bleeding   Alternatives discussed:  No treatment Anesthesia (see MAR for exact dosages):    Anesthesia method:  None Procedure details:    Treatment site:  L anterior   Treatment method:  Anterior pack   Treatment episode: initial   Post-procedure details:    Assessment:  Bleeding decreased   Patient tolerance of procedure:  Tolerated well, no immediate complications      Janee Morn 07/10/17 2120    Quintella Reichert, MD 07/11/17 [phone]

## 2017-07-10 NOTE — Telephone Encounter (Signed)
Patient called with c/o "nose bleeds." He says "it started over a week ago. I had a nose bleed after blowing my nose. About 4 days ago, it was a bad one. It bled for several hours, then finally stopped. Today it started after I had the need to blow my nose. It started about 0830 and now I still have it stopped up with a roll of gauze. When I remove it, the blood oozed out. I feel like there is a clot in there that needs to come out. I don't have dizziness. I take coumadin and have been taking it every day."  According to protocol, see PCP within 24 hours, no availability with PCP, appointment scheduled for today at 1440 with Dr. Raeford Razor, care advice given, patient verbalized understanding.  Reason for Disposition . Taking Coumadin (warfarin) or other strong blood thinner, or known bleeding disorder (e.g., thrombocytopenia)  Answer Assessment - Initial Assessment Questions 1. AMOUNT OF BLEEDING: "How bad is the bleeding?" "How much blood was lost?" "Has the bleeding stopped?"   - MILD: needed a couple tissues   - MODERATE: needed many tissues   - SEVERE: large blood clots, soaked many tissues, lasted more than 30 minutes      Moderate 2. ONSET: "When did the nosebleed start?"      Past couple of weeks 3. FREQUENCY: "How many nosebleeds have you had in the last 24 hours?"      This one today; had a bad one about 4 days ago and a week ago 4. RECURRENT SYMPTOMS: "Have there been other recent nosebleeds?" If so, ask: "How long did it take you to stop the bleeding?" "What worked best?"      Yes; about 2-3 hours to stop completely 5. CAUSE: "What do you think caused this nosebleed?"     I don't know 6. LOCAL FACTORS: "Do you have any cold symptoms?", "Have you been rubbing or picking at your nose?"     Yes; Not rubbing or picking  7. SYSTEMIC FACTORS: "Do you have high blood pressure or any bleeding problems?"     HTN 8. BLOOD THINNERS: "Do you take any blood thinners?" (e.g., coumadin, heparin,  aspirin, Plavix)    Coumadin 9. OTHER SYMPTOMS: "Do you have any other symptoms?" (e.g., lightheadedness)     No 10. PREGNANCY: "Is there any chance you are pregnant?" "When was your last menstrual period?"      N/A  Protocols used: [id number]

## 2017-07-10 NOTE — Discharge Instructions (Addendum)
Return to the emergency department if bleeding resumes, for any lightheadedness, passing out or for new concern.

## 2017-07-10 NOTE — ED Notes (Signed)
Patient's nose still bleeding after Afrin spray in triage. Patient continuing to hold pressure at this time.

## 2017-07-10 NOTE — ED Provider Notes (Signed)
[facility] EMERGENCY DEPARTMENT Provider Note   CSN: [id number] Arrival date & time: 07/10/17  1850     History   Chief Complaint Chief Complaint  Patient presents with  . Epistaxis    HPI [name] is a 82 y.o. male.  Patient with a history of atrial fibrillation, cardiac valve replacement, chronic coagulopathy, CAD, HTN, presents with nosebleed that has been on and off for the past several days, usually resolved with holding pressure. Per his wife, he has had a runny nose and sneezing for 2 weeks, no fever, and they feel this is what caused the bleeding. He was seen by his doctor today and sent here for further management. He denies pain or trauma to the nose. No fever, nausea, sore throat.  The history is provided by the patient and the spouse. No language interpreter was used.  Epistaxis      Past Medical History:  Diagnosis Date  . Aortic stenosis   . Carotid artery disease ([place]) 1994   s/p left carotid endarerectomy   . CHF with unknown LVEF (HCC)    47 %  . Chronic atrial fibrillation ([place])   . Hx of CABG   . Hypertension   . Left ventricular dysfunction 2007   LVEF 47%   . OSA (obstructive sleep apnea)   . Rheumatic fever   . Subclavian bypass stenosis ([place])   . Vitamin B12 deficiency     Patient Active Problem List   Diagnosis Date Noted  . Actinic keratoses 04/05/2017  . Leg wound, right 02/18/2016  . Edema 02/18/2016  . Long term current use of anticoagulant therapy 02/18/2016  . Hyperkalemia 07/25/2015  . Atherosclerosis of native arteries of extremity with intermittent claudication ([place]) 10/21/2014  . Thrombocytopenia ([place]) 10/20/2014  . Macrocytosis without anemia 10/20/2014  . OSA (obstructive sleep apnea) 05/15/2013  . Encounter for therapeutic drug monitoring 05/01/2013  . History of mechanical aortic valve replacement 01/02/2013  . Obesity (BMI 30-39.9) 10/20/2012  . Type II diabetes mellitus with complication ([place])  40/98/1191  . Acute asthmatic bronchitis 01/18/2012  . ACUTE KIDNEY FAILURE UNSPECIFIED 02/04/2009  . CUTANEOUS ERUPTIONS, DRUG-INDUCED 02/04/2009  . HLD (hyperlipidemia) 02/03/2009  . Essential hypertension 02/03/2009  . Coronary atherosclerosis 02/03/2009  . Atrial fibrillation ([place]) 02/03/2009  . CHRONIC DIASTOLIC HEART FAILURE 47/82/9562  . CAROTID ENDARTERECTOMY, LEFT, HX OF 02/03/2009  . INF&INFLAM REACT DUE CARD DEVICE IMPLANT&GRAFT 12/30/2008    Past Surgical History:  Procedure Laterality Date  . [place]   replaced due to aortic stenosis, St. Jude mechanical prostesis  . CAROTID ENDARTERECTOMY Left 1997   subclavian bypass Done in [place]  . CORONARY ARTERY BYPASS GRAFT  1994   w SVG to RCA and SVG to circumflex  . heart bypass     Done in [place] Medications    Prior to Admission medications   Medication Sig Start Date End Date Taking? Authorizing Provider  acetaminophen (TYLENOL) 325 MG tablet Take 325 mg by mouth every 6 (six) hours as needed (for pain).   Yes [provider]  carvedilol (COREG) 25 MG tablet Take 1 tablet (25 mg total) by mouth 2 (two) times daily with a meal. 04/12/17  Yes Belva Crome, MD  Cholecalciferol (VITAMIN D3) 2000 units capsule Take 1 capsule (2,000 Units total) by mouth daily. 04/05/17  Yes [name], [name], MD  Cyanocobalamin (VITAMIN B-12) 3000 MCG SUBL Place 3,000 mcg under  the tongue daily.   Yes [provider]  Dietary Management Product (VASCULERA) TABS Take 1 tablet by mouth daily. 04/05/17  Yes [name], [name], MD  enalapril (VASOTEC) 20 MG tablet Take 1 tablet (20 mg total) by mouth daily. 06/07/17  Yes Belva Crome, MD  ezetimibe-simvastatin (VYTORIN) 10-20 MG tablet Take 1 tablet by mouth at bedtime. 04/05/17  Yes [name], [name], MD  fluocinonide cream (LIDEX) 2.54 % Apply 1 application topically 2 (two) times daily. 04/05/17  Yes [name], [name], MD  folic  acid (FOLVITE) 1 MG tablet Take 1 tablet (1 mg total) by mouth daily. 12/02/14  Yes Brunetta Genera, MD  metFORMIN (GLUCOPHAGE-XR) 750 MG 24 hr tablet TAKE 2 TABLETS (1,500 MG TOTAL) BY MOUTH DAILY. 07/03/17  Yes Elayne Snare, MD  NON FORMULARY BiPAP: At bedtime   Yes [provider]  PROAIR HFA 108 (90 BASE) MCG/ACT inhaler Inhale 2 puffs into the lungs every 6 (six) hours as needed (for chest congestion).  03/10/14  Yes [provider]  torsemide (DEMADEX) 20 MG tablet Take 1 tablet (20 mg total) by mouth daily. 05/25/17  Yes Belva Crome, MD  warfarin (COUMADIN) 5 MG tablet TAKE AS DIRECTED BY COUMADIN CLINIC Patient taking differently: Take 2.5-5 mg by mouth See admin instructions. Take 2.5 mg by mouth in the evening on Sun/Wed/Fri and 5 mg on Mon/Tues/Thurs/Sat 03/21/17  Yes Belva Crome, MD  amoxicillin-clavulanate (AUGMENTIN) 875-125 MG tablet Take 1 tablet by mouth every 12 (twelve) hours. 07/10/17   Charlann Lange, PA-C  ampicillin (PRINCIPEN) 500 MG capsule TAKE 4 TABLETS BY MOUTH 30-60 MINUTES BEFORE YOUR PROCEDURE Patient taking differently: Take 2,000 mg by mouth See admin instructions. Take 2,000 mg by mouth 30-60 minutes before dental procedures 03/15/17   Imogene Burn, PA-C  Cyanocobalamin (VITAMIN B12) 3000 MCG/ML LIQD Place 2,000 mcg under the tongue daily. Patient not taking: Reported on 07/10/2017 12/02/14   Brunetta Genera, MD  glucose blood (ONE TOUCH ULTRA TEST) test strip USE AS DIRECTED TO TEST BLOOD GLUCOSE  EVERY OTHER DAY DX: 250.00 08/23/16   Elayne Snare, MD  glucose blood (ONETOUCH VERIO) test strip Use as instructed to check blood sugar once daily. Dx Code E11.8 05/07/17   Elayne Snare, MD  NON FORMULARY ONE TOUCH ULTRA TEST STRIPS  AND LANCETS    [provider]  [facility] DELICA LANCETS FINE MISC Use to obtain a blood specimen every day Dx code E11.8 05/07/17   Elayne Snare, MD    Family History Family History  Problem Relation Age of  Onset  . Cancer Father        lung   . Hypertension Mother     Social History Social History   Tobacco Use  . Smoking status: Former Smoker    Packs/day: 1.00    Years: 30.00    Pack years: 30.00    Types: Cigarettes    Last attempt to quit: 04/03/1992    Years since quitting: 25.2  . Smokeless tobacco: Never Used  Substance Use Topics  . Alcohol use: Yes    Comment: 4-6 ounces of wine daily  . Drug use: No     Allergies   Rifampin and Vancomycin   Review of Systems Review of Systems  Constitutional: Negative for fever.  HENT: Positive for nosebleeds.   Gastrointestinal: Negative for nausea and vomiting.  Neurological: Negative for syncope, light-headedness and headaches.     Physical Exam Updated Vital Signs BP  134/76 (BP Location: Right Arm)   Pulse 87   Temp 98 F (36.7 C) (Oral)   Resp 18   Ht 6\' 1"  (1.854 m)   Wt 118.8 kg (262 lb)   SpO2 92% Comment: Pt states he has "low O2". SpO2 was noticed to move from 89 to 92.   BMI 34.57 kg/m   Physical Exam  Constitutional: He is oriented to person, place, and time. He appears well-developed and well-nourished.  HENT:  Packing in place to right nostril. No active bleeding.   Neck: Normal range of motion.  Pulmonary/Chest: Effort normal.  Musculoskeletal: Normal range of motion.  Neurological: He is alert and oriented to person, place, and time.  Skin: Skin is warm and dry.  Psychiatric: He has a normal mood and affect.     ED Treatments / Results  Labs (all labs ordered are listed, but only abnormal results are displayed) Labs Reviewed  PROTIME-INR - Abnormal; Notable for the following components:      Result Value   Prothrombin Time 24.7 (*)    All other components within normal limits  CBC - Abnormal; Notable for the following components:   RBC 2.74 (*)    Hemoglobin 9.4 (*)    HCT 30.4 (*)    MCV 110.9 (*)    MCH 34.3 (*)    Platelets 76 (*)    All other components within normal limits     EKG None  Radiology No results found.  Procedures Procedures (including critical care time)  Medications Ordered in ED Medications  tranexamic acid (CYKLOKAPRON) injection 500 mg (has no administration in time range)  oxymetazoline (AFRIN) 0.05 % nasal spray 1 spray (1 spray Each Nare Given 07/10/17 2036)     Initial Impression / Assessment and Plan / ED Course  I have reviewed the triage vital signs and the nursing notes.  Pertinent labs & imaging results that were available during my care of the patient were reviewed by me and considered in my medical decision making (see chart for details).     Patient is here with epistaxis, packed in triage by Jeannett Senior, PA-C, with homeostasis achieved. INR elevated at 2.25, subtherapeutic for valvular disease.  Of concern is the change in hemoglobin from 10.3 earlier today to 9.4 this evening. This was discussed with the patient and importance of recheck tomorrow with his primary care was stressed. Symptoms that would warrant return to the ED were also discussed.  He has been given a referral to ENT for attempt at packing removal this week. Augmentin Rx x 7 days provided.  Final Clinical Impressions(s) / ED Diagnoses   Final diagnoses:  Epistaxis  Coagulopathy [facility])    ED Discharge Orders        Ordered    amoxicillin-clavulanate (AUGMENTIN) 875-125 MG tablet  Every 12 hours     07/10/17 2256       Charlann Lange, PA-C 07/10/17 2313    Daleen Bo, MD 07/12/17 1447

## 2017-07-10 NOTE — ED Notes (Signed)
Patient's nose began bleeding again after holding pressure, PA-C packed L nare to control bleeding.

## 2017-07-10 NOTE — Progress Notes (Signed)
[name] - 82 y.o. male MRN [id number]  Date of birth: [birth year]  SUBJECTIVE:  Including CC & ROS.  Chief Complaint  Patient presents with  . Epistaxis    Nose bleed past week; worse past couple days  . Leg Swelling    Swelling in both legs on and off    [name] is a 82 y.o. male that is  Presenting with epistaxis bilateral leg swelling.  He is on chronic anticoagulation due to a aortic valve replacement. He reports having 3-4 episodes of bleeding but none this significant. He has been bleeding since last night. He has been trying to pack his nose. He has not tried Afrin. He has not had any episodes of lightheadedness. He reports having some blood going down his throat.  Leg swelling: reports to having bilateral leg swelling and changes in the color of his skin. He denies any shortness of breath. He does have to sit up to sleep at night. He has not had any changes in his medications.  He takes torsemide for leg swelling on a daily basis.  Review of the echo from 2018 shows 55-60% ejection fraction and a normal-appearing mechanical aortic valve replacement.  Review of Systems  Constitutional: Negative for fever.  HENT: Negative for congestion.   Respiratory: Negative for cough.   Cardiovascular: Negative for chest pain.  Gastrointestinal: Negative for abdominal pain.  Musculoskeletal: Positive for arthralgias.  Skin: Positive for color change.  Neurological: Negative for weakness.  Hematological: Negative for adenopathy.  Psychiatric/Behavioral: Negative for agitation.    HISTORY: Past Medical, Surgical, Social, and Family History Reviewed & Updated per EMR.   Pertinent Historical Findings include:  Past Medical History:  Diagnosis Date  . Aortic stenosis   . Carotid artery disease ([place]) 1994   s/p left carotid endarerectomy   . CHF with unknown LVEF (HCC)    47 %  . Chronic atrial fibrillation ([place])   . Hx of CABG   . Hypertension   . Left ventricular  dysfunction 2007   LVEF 47%   . OSA (obstructive sleep apnea)   . Rheumatic fever   . Subclavian bypass stenosis ([place])   . Vitamin B12 deficiency     Past Surgical History:  Procedure Laterality Date  . [place]   replaced due to aortic stenosis, St. Jude mechanical prostesis  . CAROTID ENDARTERECTOMY Left 1997   subclavian bypass Done in [place]  . CORONARY ARTERY BYPASS GRAFT  1994   w SVG to RCA and SVG to circumflex  . heart bypass     Done in [place]    Allergies  Allergen Reactions  . Rifampin Rash    May have been caused by Vancomycin or Rifampin (??)  . Vancomycin Rash    May have been caused by Vancomycin or Rifampin (??)    Family History  Problem Relation Age of Onset  . Cancer Father        lung   . Hypertension Mother      Social History   Socioeconomic History  . Marital status: Married    Spouse name: Not on file  . Number of children: Not on file  . Years of education: Not on file  . Highest education level: Not on file  Occupational History  . Not on file  Social Needs  . Financial resource strain: Not on file  . Food insecurity:    Worry: Not on file    Inability: Not on file  .  Transportation needs:    Medical: Not on file    Non-medical: Not on file  Tobacco Use  . Smoking status: Former Smoker    Packs/day: 1.00    Years: 30.00    Pack years: 30.00    Types: Cigarettes    Last attempt to quit: 04/03/1992    Years since quitting: 25.2  . Smokeless tobacco: Never Used  Substance and Sexual Activity  . Alcohol use: Yes    Comment: 4-6 ounces of wine daily  . Drug use: No  . Sexual activity: Not on file  Lifestyle  . Physical activity:    Days per week: Not on file    Minutes per session: Not on file  . Stress: Not on file  Relationships  . Social connections:    Talks on phone: Not on file    Gets together: Not on file    Attends religious service: Not on file    Active member of club or organization:  Not on file    Attends meetings of clubs or organizations: Not on file    Relationship status: Not on file  . Intimate partner violence:    Fear of current or ex partner: Not on file    Emotionally abused: Not on file    Physically abused: Not on file    Forced sexual activity: Not on file  Other Topics Concern  . Not on file  Social History Narrative  . Not on file     PHYSICAL EXAM:  VS: BP 116/62 (BP Location: Right Arm, Patient Position: Sitting, Cuff Size: Normal)   Pulse 79   Temp 97.6 F (36.4 C) (Oral)   Ht 6' 1.5" (1.867 m)   Wt 262 lb 0.6 oz (118.9 kg)   SpO2 96%   BMI 34.10 kg/m  Physical Exam Gen: NAD, alert, cooperative with exam,  ENT: normal lips, nasal passages with bloody appearance with no active bleeding,  Eye: normal EOM, normal conjunctiva and lids CV:  +2 pitting edema, +2 pedal pulses, regular rate and rhythm, murmur present   Resp: no accessory muscle use, non-labored, clear to auscultation with no crackles GI: no masses or tenderness, no hernia  Skin: chronic venous changes in the bilateral lower extremities Neuro: normal tone, normal sensation to touch Psych:  normal insight, alert and oriented MSK: normal gait      ASSESSMENT & PLAN:   Edema Unclear if he is having a heart failure exacerbation vs chronic insufficiency. Has color change in lower legs that could be venous stasis changes.  - chest xray  - CMP, BNP - will increase torsemide BID for three day  - advised to follow up in three day if swelling isn't improving. May need to start ABX vs venous duplex    Epistaxis Having nose bleeding that has been ongoing  - INR, CBC  - counseled on packing and afrin

## 2017-07-10 NOTE — Telephone Encounter (Signed)
Spoke with patient about his labs. Looks like he has a HF exacerbation. Still awaiting chest xray. Will have him take Torsemide BID for today and tomorrow and follow up on Friday if no improvement. If bleeding continues despite measures then advised to go to ED for further management.   Rosemarie Ax, MD [facility] 07/10/2017, 5:10 PM

## 2017-07-10 NOTE — ED Provider Notes (Signed)
  Face-to-face evaluation   History: He is here for evaluation of intermittent nasal bleeding mostly left-sided.  He takes warfarin.  He denies weakness or dizziness  Physical exam: Patient examined after packing done by initial provider.  Nasal tampon left nose, without active external bleeding.  Very minimal posterior pharynx bleeding.  Patient feels like the bleeding has "stopped."  Medical screening examination/treatment/procedure(s) were conducted as a shared visit with non-physician practitioner(s) and myself.  I personally evaluated the patient during the encounter    Daleen Bo, MD 07/12/17 1447

## 2017-07-11 ENCOUNTER — Telehealth: Payer: Self-pay | Admitting: *Deleted

## 2017-07-11 DIAGNOSIS — R040 Epistaxis: Secondary | ICD-10-CM | POA: Insufficient documentation

## 2017-07-11 NOTE — Assessment & Plan Note (Addendum)
Unclear if he is having a heart failure exacerbation vs chronic insufficiency. Has color change in lower legs that could be venous stasis changes.  - chest xray  - CMP, BNP - will increase torsemide BID for three day  - advised to follow up in three day if swelling isn't improving. May need to start ABX vs venous duplex

## 2017-07-11 NOTE — Assessment & Plan Note (Signed)
Having nose bleeding that has been ongoing  - INR, CBC  - counseled on packing and afrin

## 2017-07-11 NOTE — Telephone Encounter (Signed)
Pt wife called looking for Rx, stated she thought Rx would be called in to pharmacy.  EDCM advised that Rx was given to pt at discharge, asked pt to look on the front of the packet for Rx.  Pt spouse did indeed find the Rx stapled to the front on AVS.  No further EDCM needs identified at this time.

## 2017-07-16 DIAGNOSIS — Z7289 Other problems related to lifestyle: Secondary | ICD-10-CM | POA: Diagnosis not present

## 2017-07-16 DIAGNOSIS — Z7901 Long term (current) use of anticoagulants: Secondary | ICD-10-CM

## 2017-07-16 DIAGNOSIS — Z5181 Encounter for therapeutic drug level monitoring: Secondary | ICD-10-CM | POA: Insufficient documentation

## 2017-07-16 DIAGNOSIS — Z87891 Personal history of nicotine dependence: Secondary | ICD-10-CM | POA: Diagnosis not present

## 2017-07-16 DIAGNOSIS — Z952 Presence of prosthetic heart valve: Secondary | ICD-10-CM | POA: Diagnosis not present

## 2017-07-16 DIAGNOSIS — J342 Deviated nasal septum: Secondary | ICD-10-CM | POA: Diagnosis not present

## 2017-07-16 DIAGNOSIS — R040 Epistaxis: Secondary | ICD-10-CM | POA: Diagnosis not present

## 2017-07-20 ENCOUNTER — Other Ambulatory Visit: Payer: Medicare Other | Admitting: *Deleted

## 2017-07-20 ENCOUNTER — Other Ambulatory Visit: Payer: Medicare Other

## 2017-07-20 ENCOUNTER — Ambulatory Visit: Payer: Medicare Other | Admitting: Hematology

## 2017-07-20 ENCOUNTER — Ambulatory Visit (INDEPENDENT_AMBULATORY_CARE_PROVIDER_SITE_OTHER): Payer: Medicare Other | Admitting: Pharmacist

## 2017-07-20 DIAGNOSIS — Z952 Presence of prosthetic heart valve: Secondary | ICD-10-CM | POA: Diagnosis not present

## 2017-07-20 DIAGNOSIS — I4891 Unspecified atrial fibrillation: Secondary | ICD-10-CM | POA: Diagnosis not present

## 2017-07-20 DIAGNOSIS — I5032 Chronic diastolic (congestive) heart failure: Secondary | ICD-10-CM | POA: Diagnosis not present

## 2017-07-20 DIAGNOSIS — Z5181 Encounter for therapeutic drug level monitoring: Secondary | ICD-10-CM | POA: Diagnosis not present

## 2017-07-20 LAB — BASIC METABOLIC PANEL
BUN / CREAT RATIO: 29 — AB (ref 10–24)
BUN: 60 mg/dL — ABNORMAL HIGH (ref 8–27)
CALCIUM: 9 mg/dL (ref 8.6–10.2)
CHLORIDE: 101 mmol/L (ref 96–106)
CO2: 21 mmol/L (ref 20–29)
Creatinine, Ser: 2.1 mg/dL — ABNORMAL HIGH (ref 0.76–1.27)
GFR calc non Af Amer: 29 mL/min/{1.73_m2} — ABNORMAL LOW (ref >59)
GFR, EST AFRICAN AMERICAN: 33 mL/min/{1.73_m2} — AB (ref >59)
GLUCOSE: 99 mg/dL (ref 65–99)
POTASSIUM: 5.5 mmol/L — AB (ref 3.5–5.2)
Sodium: 137 mmol/L (ref 134–144)

## 2017-07-20 LAB — POCT INR: INR: 1.9

## 2017-07-20 NOTE — Patient Instructions (Signed)
Description   Take 1 tablet today and 1.5 tablets tomorrow then Continue on same dosage 1 tablet daily except 1/2 tablet each Sundays, Wednesdays, and Fridays.  Repeat INR in 2 weeks.  Call with any new medications or procedures [phone]

## 2017-07-23 ENCOUNTER — Other Ambulatory Visit: Payer: Self-pay | Admitting: *Deleted

## 2017-07-23 ENCOUNTER — Other Ambulatory Visit: Payer: Medicare Other

## 2017-07-23 DIAGNOSIS — E875 Hyperkalemia: Secondary | ICD-10-CM

## 2017-07-23 DIAGNOSIS — R7989 Other specified abnormal findings of blood chemistry: Secondary | ICD-10-CM

## 2017-07-27 ENCOUNTER — Other Ambulatory Visit: Payer: Medicare Other | Admitting: *Deleted

## 2017-07-27 DIAGNOSIS — R7989 Other specified abnormal findings of blood chemistry: Secondary | ICD-10-CM | POA: Diagnosis not present

## 2017-07-27 DIAGNOSIS — E875 Hyperkalemia: Secondary | ICD-10-CM

## 2017-07-27 LAB — BASIC METABOLIC PANEL
BUN/Creatinine Ratio: 28 — ABNORMAL HIGH (ref 10–24)
BUN: 44 mg/dL — AB (ref 8–27)
CO2: 21 mmol/L (ref 20–29)
Calcium: 9.5 mg/dL (ref 8.6–10.2)
Chloride: 102 mmol/L (ref 96–106)
Creatinine, Ser: 1.55 mg/dL — ABNORMAL HIGH (ref 0.76–1.27)
GFR, EST AFRICAN AMERICAN: 48 mL/min/{1.73_m2} — AB (ref >59)
GFR, EST NON AFRICAN AMERICAN: 42 mL/min/{1.73_m2} — AB (ref >59)
Glucose: 91 mg/dL (ref 65–99)
POTASSIUM: 4.8 mmol/L (ref 3.5–5.2)
Sodium: 140 mmol/L (ref 134–144)

## 2017-07-30 NOTE — Progress Notes (Signed)
[facility] [place]   Hematology Clinic Note  Date of service: 08/01/17   Patient Care Team: Cassandria Anger, MD as PCP - General (Internal Medicine) Belva Crome, MD as PCP - Cardiology (Cardiology) Brunetta Genera, MD as Consulting Physician (Hematology) Belva Crome, MD as Consulting Physician (Cardiology) Elayne Snare, MD as Consulting Physician (Endocrinology)  CHIEF COMPLAINTS: follow-up for Macrocytosis and Thrombocytopenia.  HISTORY OF PRESENTING ILLNESS: Please see my initial note for details on initial presentation.  Interval history:  [name] is here for scheduled follow-up regarding his thrombocytopenia. The patient's last visit with [place] was on 01/19/17.   The pt reports coughing and congestion in his chest today which has been present for one week. He denies going to his PCP for his congestion yet. He notes that he feels weak and has felt cold as well.   He also notes that 5 weeks ago he developed severe nose bleeds which would last for several hours. He followed up with his PCP about his nose bleeds and subsequently went to the ER where it was packed. He then received silver oxide at an ENT which successfully stopped his nose bleeds.   He was taken off of Enalapril and was reduced on Torsemide for concerns for his kidneys.   Of note since the patient's last visit, pt has had a CXR completed on 07/10/17 with results revealing Marked cardiac enlargement.  No frank edema or consolidation.   Lab results today (08/01/17) of CBC, CMP, and Reticulocytes is as follows: all values are WNL except for WBC at 2.6k, RBC at 2.68, Hgb at 9.3, HCT at 29.2, MCV at 109.0, MCH at 34.7, MCHC at 31.8, RDW at 15.3, Platelets at 56k, Lymphs Abs at 0.7k, BUN at 34, Creatinine at 1.58, Alk Phos at 208, Retic Ct Pct at 3.4%.  Ferritin 08/01/17 is WNL at 114. Vitamin B12 08/01/17 is  wnl  On review of systems, pt reports SOB, cough, congestion, weakness, chills, and denies light  headedness, dizziness, abdominal pains, and any other symptoms.   MEDICAL HISTORY:  Past Medical History:  Diagnosis Date  . Aortic stenosis   . Carotid artery disease ([place]) 1994   s/p left carotid endarerectomy   . CHF with unknown LVEF ([facility])    47 %  . Chronic atrial fibrillation ([place])   . Hx of CABG   . Hypertension   . Left ventricular dysfunction 2007   LVEF 47%   . OSA (obstructive sleep apnea)   . Rheumatic fever   . Subclavian bypass stenosis ([place])   . Vitamin B12 deficiency     SURGICAL HISTORY: Past Surgical History:  Procedure Laterality Date  . [place]   replaced due to aortic stenosis, [employer] mechanical prostesis  . CAROTID ENDARTERECTOMY Left 1997   subclavian bypass Done in [place]  . CORONARY ARTERY BYPASS GRAFT  1994   w SVG to RCA and SVG to circumflex  . heart bypass     Done in [place]    SOCIAL HISTORY: Social History   Socioeconomic History  . Marital status: Married    Spouse name: Not on file  . Number of children: Not on file  . Years of education: Not on file  . Highest education level: Not on file  Occupational History  . Not on file  Social Needs  . Financial resource strain: Not on file  . Food insecurity:    Worry: Not on file  Inability: Not on file  . Transportation needs:    Medical: Not on file    Non-medical: Not on file  Tobacco Use  . Smoking status: Former Smoker    Packs/day: 1.00    Years: 30.00    Pack years: 30.00    Types: Cigarettes    Last attempt to quit: 04/03/1992    Years since quitting: 25.3  . Smokeless tobacco: Never Used  Substance and Sexual Activity  . Alcohol use: Yes    Comment: 4-6 ounces of wine daily  . Drug use: No  . Sexual activity: Not on file  Lifestyle  . Physical activity:    Days per week: Not on file    Minutes per session: Not on file  . Stress: Not on file  Relationships  . Social connections:    Talks on phone: Not on file    Gets together: Not on  file    Attends religious service: Not on file    Active member of club or organization: Not on file    Attends meetings of clubs or organizations: Not on file    Relationship status: Not on file  . Intimate partner violence:    Fear of current or ex partner: Not on file    Emotionally abused: Not on file    Physically abused: Not on file    Forced sexual activity: Not on file  Other Topics Concern  . Not on file  Social History Narrative  . Not on file    FAMILY HISTORY: Family History  Problem Relation Age of Onset  . Cancer Father        lung   . Hypertension Mother     ALLERGIES:  is allergic to rifampin and vancomycin.  MEDICATIONS:  Current Outpatient Medications  Medication Sig Dispense Refill  . acetaminophen (TYLENOL) 325 MG tablet Take 325 mg by mouth every 6 (six) hours as needed (for pain).    . carvedilol (COREG) 25 MG tablet Take 1 tablet (25 mg total) by mouth 2 (two) times daily with a meal. 180 tablet 3  . Cholecalciferol (VITAMIN D3) 2000 units capsule Take 1 capsule (2,000 Units total) by mouth daily. 100 capsule 3  . Cyanocobalamin (VITAMIN B-12) 3000 MCG SUBL Take 3,000 mcg by mouth daily.     . Dietary Management Product (VASCULERA) TABS Take 1 tablet by mouth daily. 90 tablet 3  . enalapril (VASOTEC) 20 MG tablet Take 1 tablet (20 mg total) by mouth daily. 90 tablet 3  . ezetimibe-simvastatin (VYTORIN) 10-20 MG tablet Take 1 tablet by mouth at bedtime. 90 tablet 3  . fluocinonide cream (LIDEX) 4.09 % Apply 1 application topically 2 (two) times daily. (Patient taking differently: Apply 1 application topically 2 (two) times daily as needed (to affected areas of legs). ) 30 g 3  . folic acid (FOLVITE) 1 MG tablet Take 1 tablet (1 mg total) by mouth daily.    Marland Kitchen glucose blood (ONE TOUCH ULTRA TEST) test strip USE AS DIRECTED TO TEST BLOOD GLUCOSE  EVERY OTHER DAY DX: 250.00 50 each 2  . glucose blood (ONETOUCH VERIO) test strip Use as instructed to check blood  sugar once daily. Dx Code E11.8 100 each 3  . metFORMIN (GLUCOPHAGE-XR) 750 MG 24 hr tablet TAKE 2 TABLETS (1,500 MG TOTAL) BY MOUTH DAILY. 180 tablet 2  . NON FORMULARY BiPAP: At bedtime    . NON FORMULARY ONE TOUCH ULTRA TEST STRIPS  AND LANCETS    .  ONETOUCH DELICA LANCETS FINE MISC Use to obtain a blood specimen every day Dx code E11.8 100 each 1  . PROAIR HFA 108 (90 BASE) MCG/ACT inhaler Inhale 2 puffs into the lungs every 6 (six) hours as needed (for chest congestion).     . torsemide (DEMADEX) 20 MG tablet Take 1 tablet (20 mg total) by mouth daily. 90 tablet 3  . warfarin (COUMADIN) 5 MG tablet TAKE AS DIRECTED BY COUMADIN CLINIC (Patient taking differently: Take 2.5-5 mg by mouth See admin instructions. Take 2.5 mg by mouth in the evening on Sun/Wed/Fri and 5 mg on Mon/Tues/Thurs/Sat) 90 tablet 1  . ampicillin (PRINCIPEN) 500 MG capsule TAKE 4 TABLETS BY MOUTH 30-60 MINUTES BEFORE YOUR PROCEDURE (Patient not taking: Reported on 08/01/2017) 4 capsule 0   No current facility-administered medications for this visit.     REVIEW OF SYSTEMS:   A 10+ POINT REVIEW OF SYSTEMS WAS OBTAINED including neurology, dermatology, psychiatry, cardiac, respiratory, lymph, extremities, GI, GU, Musculoskeletal, constitutional, breasts, reproductive, HEENT.  All pertinent positives are noted in the HPI.  All others are negative.   PHYSICAL EXAMINATION: ECOG PERFORMANCE STATUS: 1 - Symptomatic but completely ambulatory  Vitals:   08/01/17 1253  BP: (!) 75/54  Pulse: 77  Resp: 18  Temp: 98.8 F (37.1 C)  SpO2: (!) 89%   Filed Weights   08/01/17 1253  Weight: 253 lb 11.2 oz (115.1 kg)    GENERAL:alert, in no acute distress and comfortable SKIN: no acute rashes, no significant lesions EYES: conjunctiva are pink and non-injected, sclera anicteric OROPHARYNX: MMM, no exudates, no oropharyngeal erythema or ulceration NECK: supple, no JVD LYMPH:  no palpable lymphadenopathy in the cervical, axillary  or inguinal regions LUNGS: crackles, scattered rhonchi  HEART: regular rate & rhythm and no murmurs and clear click of his metallic aortic valve noted (+), 2+ pitting edema in b/l lower extremities ABDOMEN:  normoactive bowel sounds , non tender, not distended. Extremity: b/l leg edema  PSYCH: alert & oriented x 3 with fluent speech NEURO: no focal motor/sensory deficits    LABORATORY DATA:   . CBC Latest Ref Rng & Units 08/01/2017 07/10/2017 07/10/2017  WBC 4.0 - 10.3 K/uL 2.6(L) 5.1 4.3  Hemoglobin 13.0 - 17.1 g/dL 9.3(L) 9.4(L) 10.3(L)  Hematocrit 38.4 - 49.9 % 29.2(L) 30.4(L) 30.9(L)  Platelets 140 - 400 K/uL 56(L) 76(L) 72.0(L)    . CMP Latest Ref Rng & Units 08/01/2017 07/27/2017 07/20/2017  Glucose 70 - 140 mg/dL 111 91 99  BUN 7 - 26 mg/dL 34(H) 44(H) 60(H)  Creatinine 0.70 - 1.30 mg/dL 1.58(H) 1.55(H) 2.10(H)  Sodium 136 - 145 mmol/L 136 140 137  Potassium 3.5 - 5.1 mmol/L 4.2 4.8 5.5(H)  Chloride 98 - 109 mmol/L 105 102 101  CO2 22 - 29 mmol/L _0 Calcium 8.4 - 10.4 mg/dL 9.1 9.5 9.0  Total Protein 6.4 - 8.3 g/dL 7.1 - -  Total Bilirubin 0.2 - 1.2 mg/dL 1.1 - -  Alkaline Phos 40 - 150 U/L 208(H) - -  AST 5 - 34 U/L 25 - -  ALT 0 - 55 U/L 13 - -    RADIOGRAPHIC STUDIES: I have personally reviewed the radiological images as listed and agreed with the findings in the report. Dg Chest 2 View  Result Date: 07/11/2017 CLINICAL DATA:  Chronic dyspnea.  Pedal edema.  Hypertension. EXAM: CHEST - 2 VIEW COMPARISON:  None. FINDINGS: Marked enlargement of cardiac silhouette. Previous CABG. No consolidation or edema. Mild  vascular congestion at the lung bases. Small BILATERAL pleural effusions. No osseous findings. Similar appearance to priors. IMPRESSION: Marked cardiac enlargement.  No frank edema or consolidation. Electronically Signed   By: Staci Righter M.D.   On: 07/11/2017 09:05    ASSESSMENT & PLAN   1) Thrombocytopenia. Moderate. Platelet counts are stable and the same  ballpark in 60-70k  range for the last >1 year, early 2018. 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 + cannot rule out ITP element. Could have possible element of MDS. He has some pelgeroid neutrophils on his peripheral blood smear that are suggestive of possible MDS. Hepatitis B, and C serologies negative. No evidence of a lymphoproliferative syndrome on peripheral blood smear or clinical examination. SPEP showed no monoclonal protein.  Plan - patient's platelets remain low but stable at 54k . -If platelet counts drop below 50K might need to limit the INR fluctuations on Coumadin from 2-3 more strictly. -continue absolute alcohol cessation -Avoid NSAIDS and other medications known to cause thrombocytopenia especially ranitidine and other  H2 blockers, sulfa drugs, PPIs. -continue vitamin B12 2000 g sublingually daily and Folic acid 1 mg by mouth daily to support accelerated hematopoeisis with valve hemolysis. -He will repeat labs in 3 months with his PCP Dr. Alain Beard and 6 months with his follow up in our clinic -Coumadin monitoring as per cardiology clinic -Offered Flu vaccination today and pt agreed. He will need a subcutaneous shot due to his plt and coumadin levels. -if plts <50k (due to anticoagulation) or if bleeding issues might need to consider Nplate/Promacta.  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 Has somewhat elevated LDH and low haptoglobin confirms the presence of low-grade hemolysis. Patient has no clinical evidence of hemoglobinuria. Peripheral blood smear showed minimal rare schistocytes. No micro-spherocytes to suggest autoimmune hemolysis.Coombs negative. 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 PLAN -aggressive B12 replacement - folic acid 1-2 mg po daily to maintain erythropoiesis in the  setting of increased demand from chronic hemolysis. -Bone marrow examination if worsening anemia or other cytopenia. -Discussed pt labwork today, 08/01/17; some recent anemia with Hgb at 9.3 -Oxygen sat is low at 89%, and BP at 75/54.  -Anemia most likely explained by noteworthy nose bleeds for a couple weeks, though recently stopped after seeing ENT. -Recommended that the pt present to ED in light of concerning low O2 Sat and hypotension, in the setting of cough and chest congestion, hx of Afib, and kidney and heart problems. The pt is agreeable to this.   -My present concern is pneumonia.     RTC with Dr Irene Limbo in 6 months with labs Recommendation to go to ER today due to hypotension and hypoxia concern for Pneumonia/CHF decompensation.   I spent 15 minutes counseling the pt face to face. The toal time spent in the appt was 20 minutes and more than 50% was on counseling and direct patient cares.  Sullivan Lone MD MS Hematology/Oncology Physician [facility]  (Office):       [phone] (Work cell):  [phone] (Fax):           [phone]  This document serves as a record of services personally performed by Sullivan Lone, MD. It was created on his behalf by Baldwin Jamaica, a trained medical scribe. The creation of this record is based on the scribe's personal observations and the provider's statements  to them.   Marland Kitchen

## 2017-07-31 ENCOUNTER — Telehealth: Payer: Self-pay | Admitting: *Deleted

## 2017-07-31 DIAGNOSIS — N179 Acute kidney failure, unspecified: Secondary | ICD-10-CM

## 2017-07-31 NOTE — Telephone Encounter (Signed)
-----   Message from Belva Crome, MD sent at 07/28/2017  5:10 PM EDT ----- Let the patient know let the patient know that kidney function is back to baseline.  Kidney function is impeding our ability to adequately manage his heart failure and valve problem.  If he has never seen a nephrologist, we should set him up for consultation.  We need to determine if ACE/ARB therapy will be safe to use since the develops acute worsening in kidney function and hyperkalemia when therapy is added. A copy will be sent to [name], [name], MD

## 2017-07-31 NOTE — Telephone Encounter (Signed)
Spoke with pt and went over results.  Pt has never seen nephrology before.  Advised I will send in referral.  Pt appreciative for call.

## 2017-08-01 ENCOUNTER — Inpatient Hospital Stay (HOSPITAL_COMMUNITY)
Admission: EM | Admit: 2017-08-01 | Discharge: 2017-08-03 | DRG: 190 | Disposition: A | Discharge status: Home / Self Care | Payer: Medicare Other | Attending: Family Medicine | Admitting: Family Medicine

## 2017-08-01 ENCOUNTER — Emergency Department (HOSPITAL_COMMUNITY): Payer: Medicare Other

## 2017-08-01 ENCOUNTER — Inpatient Hospital Stay: Payer: Medicare Other | Attending: Hematology | Admitting: Hematology

## 2017-08-01 ENCOUNTER — Telehealth: Payer: Self-pay

## 2017-08-01 ENCOUNTER — Telehealth: Payer: Self-pay | Admitting: Internal Medicine

## 2017-08-01 ENCOUNTER — Encounter (HOSPITAL_COMMUNITY): Payer: Self-pay

## 2017-08-01 ENCOUNTER — Other Ambulatory Visit: Payer: Self-pay

## 2017-08-01 ENCOUNTER — Inpatient Hospital Stay: Payer: Medicare Other

## 2017-08-01 VITALS — BP 75/54 | HR 77 | Temp 98.8°F | Resp 18 | Ht 73.0 in | Wt 253.7 lb

## 2017-08-01 DIAGNOSIS — D649 Anemia, unspecified: Secondary | ICD-10-CM

## 2017-08-01 DIAGNOSIS — I10 Essential (primary) hypertension: Secondary | ICD-10-CM | POA: Diagnosis present

## 2017-08-01 DIAGNOSIS — Z7901 Long term (current) use of anticoagulants: Secondary | ICD-10-CM

## 2017-08-01 DIAGNOSIS — E538 Deficiency of other specified B group vitamins: Secondary | ICD-10-CM | POA: Diagnosis present

## 2017-08-01 DIAGNOSIS — Z801 Family history of malignant neoplasm of trachea, bronchus and lung: Secondary | ICD-10-CM

## 2017-08-01 DIAGNOSIS — G4733 Obstructive sleep apnea (adult) (pediatric): Secondary | ICD-10-CM | POA: Diagnosis present

## 2017-08-01 DIAGNOSIS — I5023 Acute on chronic systolic (congestive) heart failure: Secondary | ICD-10-CM | POA: Diagnosis present

## 2017-08-01 DIAGNOSIS — I959 Hypotension, unspecified: Secondary | ICD-10-CM

## 2017-08-01 DIAGNOSIS — J441 Chronic obstructive pulmonary disease with (acute) exacerbation: Secondary | ICD-10-CM | POA: Diagnosis not present

## 2017-08-01 DIAGNOSIS — R0902 Hypoxemia: Secondary | ICD-10-CM | POA: Diagnosis not present

## 2017-08-01 DIAGNOSIS — I482 Chronic atrial fibrillation: Secondary | ICD-10-CM | POA: Diagnosis present

## 2017-08-01 DIAGNOSIS — J9621 Acute and chronic respiratory failure with hypoxia: Secondary | ICD-10-CM | POA: Diagnosis not present

## 2017-08-01 DIAGNOSIS — D7589 Other specified diseases of blood and blood-forming organs: Secondary | ICD-10-CM

## 2017-08-01 DIAGNOSIS — R911 Solitary pulmonary nodule: Secondary | ICD-10-CM | POA: Diagnosis present

## 2017-08-01 DIAGNOSIS — E785 Hyperlipidemia, unspecified: Secondary | ICD-10-CM | POA: Diagnosis present

## 2017-08-01 DIAGNOSIS — I130 Hypertensive heart and chronic kidney disease with heart failure and stage 1 through stage 4 chronic kidney disease, or unspecified chronic kidney disease: Secondary | ICD-10-CM | POA: Diagnosis not present

## 2017-08-01 DIAGNOSIS — E8770 Fluid overload, unspecified: Secondary | ICD-10-CM

## 2017-08-01 DIAGNOSIS — R0609 Other forms of dyspnea: Secondary | ICD-10-CM | POA: Diagnosis not present

## 2017-08-01 DIAGNOSIS — R0602 Shortness of breath: Secondary | ICD-10-CM | POA: Diagnosis not present

## 2017-08-01 DIAGNOSIS — Z952 Presence of prosthetic heart valve: Secondary | ICD-10-CM

## 2017-08-01 DIAGNOSIS — I4891 Unspecified atrial fibrillation: Secondary | ICD-10-CM | POA: Diagnosis present

## 2017-08-01 DIAGNOSIS — N183 Chronic kidney disease, stage 3 (moderate): Secondary | ICD-10-CM | POA: Diagnosis present

## 2017-08-01 DIAGNOSIS — Z87891 Personal history of nicotine dependence: Secondary | ICD-10-CM

## 2017-08-01 DIAGNOSIS — D696 Thrombocytopenia, unspecified: Secondary | ICD-10-CM | POA: Diagnosis not present

## 2017-08-01 DIAGNOSIS — Z951 Presence of aortocoronary bypass graft: Secondary | ICD-10-CM

## 2017-08-01 DIAGNOSIS — E118 Type 2 diabetes mellitus with unspecified complications: Secondary | ICD-10-CM | POA: Diagnosis present

## 2017-08-01 DIAGNOSIS — E1122 Type 2 diabetes mellitus with diabetic chronic kidney disease: Secondary | ICD-10-CM | POA: Diagnosis present

## 2017-08-01 DIAGNOSIS — D61818 Other pancytopenia: Secondary | ICD-10-CM | POA: Diagnosis present

## 2017-08-01 DIAGNOSIS — I2510 Atherosclerotic heart disease of native coronary artery without angina pectoris: Secondary | ICD-10-CM | POA: Diagnosis present

## 2017-08-01 DIAGNOSIS — Z881 Allergy status to other antibiotic agents status: Secondary | ICD-10-CM

## 2017-08-01 DIAGNOSIS — Z8249 Family history of ischemic heart disease and other diseases of the circulatory system: Secondary | ICD-10-CM

## 2017-08-01 DIAGNOSIS — R05 Cough: Secondary | ICD-10-CM | POA: Diagnosis not present

## 2017-08-01 LAB — CBC WITH DIFFERENTIAL (CANCER CENTER ONLY)
Basophils Absolute: 0 10*3/uL (ref 0.0–0.1)
Basophils Relative: 1 %
EOS PCT: 0 %
Eosinophils Absolute: 0 10*3/uL (ref 0.0–0.5)
HCT: 29.2 % — ABNORMAL LOW (ref 38.4–49.9)
Hemoglobin: 9.3 g/dL — ABNORMAL LOW (ref 13.0–17.1)
LYMPHS ABS: 0.7 10*3/uL — AB (ref 0.9–3.3)
LYMPHS PCT: 25 %
MCH: 34.7 pg — AB (ref 27.2–33.4)
MCHC: 31.8 g/dL — ABNORMAL LOW (ref 32.0–36.0)
MCV: 109 fL — AB (ref 79.3–98.0)
MONO ABS: 0.2 10*3/uL (ref 0.1–0.9)
Monocytes Relative: 9 %
Neutro Abs: 1.7 10*3/uL (ref 1.5–6.5)
Neutrophils Relative %: 65 %
PLATELETS: 56 10*3/uL — AB (ref 140–400)
RBC: 2.68 MIL/uL — AB (ref 4.20–5.82)
RDW: 15.3 % — AB (ref 11.0–14.6)
WBC Count: 2.6 10*3/uL — ABNORMAL LOW (ref 4.0–10.3)

## 2017-08-01 LAB — MAGNESIUM: MAGNESIUM: 2.1 mg/dL (ref 1.7–2.4)

## 2017-08-01 LAB — COMPREHENSIVE METABOLIC PANEL
ALK PHOS: 208 U/L — AB (ref 40–150)
ALT: 13 U/L (ref 0–55)
ALT: 16 U/L — AB (ref 17–63)
AST: 25 U/L (ref 5–34)
AST: 28 U/L (ref 15–41)
Albumin: 3.6 g/dL (ref 3.5–5.0)
Albumin: 3.5 g/dL (ref 3.5–5.0)
Alkaline Phosphatase: 183 U/L — ABNORMAL HIGH (ref 38–126)
Anion gap: 9 (ref 3–11)
Anion gap: 12 (ref 5–15)
BILIRUBIN TOTAL: 1.6 mg/dL — AB (ref 0.3–1.2)
BUN: 34 mg/dL — ABNORMAL HIGH (ref 7–26)
BUN: 33 mg/dL — AB (ref 6–20)
CALCIUM: 9.1 mg/dL (ref 8.4–10.4)
CHLORIDE: 104 mmol/L (ref 101–111)
CO2: 22 mmol/L (ref 22–29)
CO2: 22 mmol/L (ref 22–32)
CREATININE: 1.48 mg/dL — AB (ref 0.61–1.24)
Calcium: 9 mg/dL (ref 8.9–10.3)
Chloride: 105 mmol/L (ref 98–109)
Creatinine, Ser: 1.58 mg/dL — ABNORMAL HIGH (ref 0.70–1.30)
GFR calc Af Amer: 46 mL/min — ABNORMAL LOW (ref >60)
GFR calc non Af Amer: 40 mL/min — ABNORMAL LOW (ref >60)
GFR, EST AFRICAN AMERICAN: 50 mL/min — AB (ref >60)
GFR, EST NON AFRICAN AMERICAN: 43 mL/min — AB (ref >60)
GLUCOSE: 111 mg/dL (ref 70–140)
Glucose, Bld: 105 mg/dL — ABNORMAL HIGH (ref 65–99)
Potassium: 4.2 mmol/L (ref 3.5–5.1)
Potassium: 4.1 mmol/L (ref 3.5–5.1)
SODIUM: 136 mmol/L (ref 136–145)
Sodium: 138 mmol/L (ref 135–145)
Total Bilirubin: 1.1 mg/dL (ref 0.2–1.2)
Total Protein: 7.1 g/dL (ref 6.4–8.3)
Total Protein: 7.5 g/dL (ref 6.5–8.1)

## 2017-08-01 LAB — CBC WITH DIFFERENTIAL/PLATELET
Basophils Absolute: 0 10*3/uL (ref 0.0–0.1)
Basophils Relative: 1 %
EOS PCT: 0 %
Eosinophils Absolute: 0 10*3/uL (ref 0.0–0.7)
HEMATOCRIT: 29.1 % — AB (ref 39.0–52.0)
Hemoglobin: 9.3 g/dL — ABNORMAL LOW (ref 13.0–17.0)
LYMPHS PCT: 31 %
Lymphs Abs: 0.8 10*3/uL (ref 0.7–4.0)
MCH: 35.1 pg — ABNORMAL HIGH (ref 26.0–34.0)
MCHC: 32 g/dL (ref 30.0–36.0)
MCV: 109.8 fL — AB (ref 78.0–100.0)
Monocytes Absolute: 0.3 10*3/uL (ref 0.1–1.0)
Monocytes Relative: 10 %
Neutro Abs: 1.4 10*3/uL — ABNORMAL LOW (ref 1.7–7.7)
Neutrophils Relative %: 58 %
PLATELETS: 59 10*3/uL — AB (ref 150–400)
RBC: 2.65 MIL/uL — AB (ref 4.22–5.81)
RDW: 15.4 % (ref 11.5–15.5)
WBC: 2.5 10*3/uL — AB (ref 4.0–10.5)

## 2017-08-01 LAB — PROTIME-INR
INR: 2.95
Prothrombin Time: 30.5 seconds — ABNORMAL HIGH (ref 11.4–15.2)

## 2017-08-01 LAB — VITAMIN B12: Vitamin B-12: 1787 pg/mL — ABNORMAL HIGH (ref 180–914)

## 2017-08-01 LAB — PHOSPHORUS: Phosphorus: 3.3 mg/dL (ref 2.5–4.6)

## 2017-08-01 LAB — RETICULOCYTES
RBC.: 2.68 MIL/uL — ABNORMAL LOW (ref 4.20–5.82)
Retic Count, Absolute: 91.1 10*3/uL (ref 34.8–93.9)
Retic Ct Pct: 3.4 % — ABNORMAL HIGH (ref 0.8–1.8)

## 2017-08-01 LAB — BRAIN NATRIURETIC PEPTIDE: B NATRIURETIC PEPTIDE 5: 784.4 pg/mL — AB (ref 0.0–100.0)

## 2017-08-01 LAB — FERRITIN: Ferritin: 114 ng/mL (ref 22–316)

## 2017-08-01 LAB — TROPONIN I

## 2017-08-01 MED ORDER — GUAIFENESIN ER 600 MG PO TB12
600.0000 mg | ORAL_TABLET | Freq: Two times a day (BID) | ORAL | Status: DC
  Administered 2017-08-01 – 2017-08-02 (×2): 600 mg via ORAL
  Filled 2017-08-01 (×2): qty 1

## 2017-08-01 MED ORDER — TORSEMIDE 20 MG PO TABS
20.0000 mg | ORAL_TABLET | Freq: Every day | ORAL | Status: DC
  Administered 2017-08-02 – 2017-08-03 (×2): 20 mg via ORAL
  Filled 2017-08-01 (×2): qty 1

## 2017-08-01 MED ORDER — SODIUM CHLORIDE 0.9 % IV SOLN
500.0000 mg | Freq: Once | INTRAVENOUS | Status: AC
  Administered 2017-08-01: 500 mg via INTRAVENOUS
  Filled 2017-08-01: qty 500

## 2017-08-01 MED ORDER — CARVEDILOL 25 MG PO TABS
25.0000 mg | ORAL_TABLET | Freq: Two times a day (BID) | ORAL | Status: DC
  Administered 2017-08-02 – 2017-08-03 (×3): 25 mg via ORAL
  Filled 2017-08-01 (×3): qty 1

## 2017-08-01 MED ORDER — VITAMIN D 1000 UNITS PO TABS
2000.0000 [IU] | ORAL_TABLET | Freq: Every day | ORAL | Status: DC
  Administered 2017-08-02 – 2017-08-03 (×2): 2000 [IU] via ORAL
  Filled 2017-08-01 (×2): qty 2

## 2017-08-01 MED ORDER — FUROSEMIDE 10 MG/ML IJ SOLN
20.0000 mg | Freq: Once | INTRAMUSCULAR | Status: AC
  Administered 2017-08-01: 20 mg via INTRAVENOUS

## 2017-08-01 MED ORDER — WARFARIN - PHARMACIST DOSING INPATIENT: Freq: Every day | Status: DC

## 2017-08-01 MED ORDER — ALBUTEROL SULFATE (2.5 MG/3ML) 0.083% IN NEBU
5.0000 mg | INHALATION_SOLUTION | Freq: Once | RESPIRATORY_TRACT | Status: AC
  Administered 2017-08-01: 5 mg via RESPIRATORY_TRACT
  Filled 2017-08-01: qty 6

## 2017-08-01 MED ORDER — MAGNESIUM SULFATE 2 GM/50ML IV SOLN
2.0000 g | Freq: Once | INTRAVENOUS | Status: AC
  Administered 2017-08-01: 2 g via INTRAVENOUS
  Filled 2017-08-01: qty 50

## 2017-08-01 MED ORDER — IPRATROPIUM-ALBUTEROL 0.5-2.5 (3) MG/3ML IN SOLN
3.0000 mL | Freq: Three times a day (TID) | RESPIRATORY_TRACT | Status: DC
  Administered 2017-08-02 – 2017-08-03 (×4): 3 mL via RESPIRATORY_TRACT
  Filled 2017-08-01 (×2): qty 3

## 2017-08-01 MED ORDER — METHYLPREDNISOLONE SODIUM SUCC 125 MG IJ SOLR
125.0000 mg | Freq: Once | INTRAMUSCULAR | Status: AC
  Administered 2017-08-01: 125 mg via INTRAVENOUS
  Filled 2017-08-01: qty 2

## 2017-08-01 MED ORDER — EZETIMIBE-SIMVASTATIN 10-20 MG PO TABS
1.0000 | ORAL_TABLET | Freq: Every day | ORAL | Status: DC
  Administered 2017-08-01 – 2017-08-02 (×2): 1 via ORAL
  Filled 2017-08-01 (×2): qty 1

## 2017-08-01 MED ORDER — ALBUTEROL SULFATE (2.5 MG/3ML) 0.083% IN NEBU: 2.5000 mg | INHALATION_SOLUTION | RESPIRATORY_TRACT | Status: DC | PRN

## 2017-08-01 MED ORDER — METFORMIN HCL ER 750 MG PO TB24
1500.0000 mg | ORAL_TABLET | Freq: Every day | ORAL | Status: DC
  Administered 2017-08-02 – 2017-08-03 (×2): 1500 mg via ORAL
  Filled 2017-08-01 (×3): qty 2

## 2017-08-01 MED ORDER — IPRATROPIUM-ALBUTEROL 0.5-2.5 (3) MG/3ML IN SOLN: 3.0000 mL | Freq: Four times a day (QID) | RESPIRATORY_TRACT | Status: DC

## 2017-08-01 MED ORDER — WARFARIN SODIUM 2.5 MG PO TABS
2.5000 mg | ORAL_TABLET | Freq: Once | ORAL | Status: AC
  Administered 2017-08-01: 2.5 mg via ORAL
  Filled 2017-08-01: qty 1

## 2017-08-01 MED ORDER — IPRATROPIUM-ALBUTEROL 0.5-2.5 (3) MG/3ML IN SOLN
3.0000 mL | RESPIRATORY_TRACT | Status: AC
  Administered 2017-08-01 (×3): 3 mL via RESPIRATORY_TRACT
  Filled 2017-08-01: qty 9

## 2017-08-01 MED ORDER — FOLIC ACID 1 MG PO TABS
1.0000 mg | ORAL_TABLET | Freq: Every day | ORAL | Status: DC
  Administered 2017-08-02 – 2017-08-03 (×2): 1 mg via ORAL
  Filled 2017-08-01 (×2): qty 1

## 2017-08-01 MED ORDER — ONDANSETRON HCL 4 MG/2ML IJ SOLN: 4.0000 mg | Freq: Four times a day (QID) | INTRAMUSCULAR | Status: DC | PRN

## 2017-08-01 MED ORDER — FUROSEMIDE 10 MG/ML IJ SOLN
80.0000 mg | Freq: Once | INTRAMUSCULAR | Status: DC
  Filled 2017-08-01: qty 8

## 2017-08-01 MED ORDER — ONDANSETRON HCL 4 MG PO TABS: 4.0000 mg | ORAL_TABLET | Freq: Four times a day (QID) | ORAL | Status: DC | PRN

## 2017-08-01 MED ORDER — VITAMIN B-12 1000 MCG PO TABS
3000.0000 ug | ORAL_TABLET | Freq: Every day | ORAL | Status: DC
  Administered 2017-08-02 – 2017-08-03 (×2): 3000 ug via ORAL
  Filled 2017-08-01 (×2): qty 3

## 2017-08-01 MED ORDER — METHYLPREDNISOLONE SODIUM SUCC 125 MG IJ SOLR
125.0000 mg | Freq: Four times a day (QID) | INTRAMUSCULAR | Status: DC
  Administered 2017-08-01 – 2017-08-02 (×2): 125 mg via INTRAVENOUS
  Filled 2017-08-01 (×2): qty 2

## 2017-08-01 NOTE — Telephone Encounter (Signed)
Left a detailed message of upcoming appointment. Per 5/1 los

## 2017-08-01 NOTE — ED Notes (Signed)
Pt concerned about home medications, informed hospitalist Olevia Bowens, placing orders now.

## 2017-08-01 NOTE — ED Provider Notes (Addendum)
[facility] Provider Note   CSN: [id number] Arrival date & time: 08/01/17  1358     History   Chief Complaint Chief Complaint  Patient presents with  . Shortness of Breath  . Hypotension    HPI [name] is a 82 y.o. male.  The history is provided by the patient and the spouse.  Shortness of Breath  This is a recurrent problem. The average episode lasts 2 weeks. The problem occurs continuously.The problem has been gradually worsening. Pertinent negatives include no fever, no neck pain, no PND and no orthopnea. He has tried nothing for the symptoms.    Past Medical History:  Diagnosis Date  . Aortic stenosis   . Carotid artery disease ([place]) 1994   s/p left carotid endarerectomy   . CHF with unknown LVEF (HCC)    47 %  . Chronic atrial fibrillation ([place])   . Hx of CABG   . Hypertension   . Left ventricular dysfunction 2007   LVEF 47%   . OSA (obstructive sleep apnea)   . Rheumatic fever   . Subclavian bypass stenosis ([place])   . Vitamin B12 deficiency     Patient Active Problem List   Diagnosis Date Noted  . COPD exacerbation ([place]) 08/01/2017  . Pancytopenia ([place]) 08/01/2017  . Epistaxis 07/11/2017  . Actinic keratoses 04/05/2017  . Leg wound, right 02/18/2016  . Edema 02/18/2016  . Long term current use of anticoagulant therapy 02/18/2016  . Hyperkalemia 07/25/2015  . Atherosclerosis of native arteries of extremity with intermittent claudication ([place]) 10/21/2014  . Thrombocytopenia ([place]) 10/20/2014  . Macrocytosis without anemia 10/20/2014  . OSA (obstructive sleep apnea) 05/15/2013  . Encounter for therapeutic drug monitoring 05/01/2013  . History of mechanical aortic valve replacement 01/02/2013  . Obesity (BMI 30-39.9) 10/20/2012  . Type II diabetes mellitus with complication ([place]) 41/93/7902  . Acute asthmatic bronchitis 01/18/2012  . ACUTE KIDNEY FAILURE UNSPECIFIED 02/04/2009  . CUTANEOUS ERUPTIONS,  DRUG-INDUCED 02/04/2009  . HLD (hyperlipidemia) 02/03/2009  . Essential hypertension 02/03/2009  . Coronary atherosclerosis 02/03/2009  . Atrial fibrillation ([place]) 02/03/2009  . CHRONIC DIASTOLIC HEART FAILURE 40/97/3532  . CAROTID ENDARTERECTOMY, LEFT, HX OF 02/03/2009  . INF&INFLAM REACT DUE CARD DEVICE IMPLANT&GRAFT 12/30/2008    Past Surgical History:  Procedure Laterality Date  . [place]   replaced due to aortic stenosis, [employer] mechanical prostesis  . CAROTID ENDARTERECTOMY Left 1997   subclavian bypass Done in [place]  . CORONARY ARTERY BYPASS GRAFT  1994   w SVG to RCA and SVG to circumflex  . heart bypass     Done in [place] Medications    Prior to Admission medications   Medication Sig Start Date End Date Taking? Authorizing Provider  acetaminophen (TYLENOL) 500 MG tablet Take 500-1,000 mg by mouth daily as needed (for pain).    Yes [provider]  carvedilol (COREG) 25 MG tablet Take 1 tablet (25 mg total) by mouth 2 (two) times daily with a meal. 04/12/17  Yes Belva Crome, MD  Cholecalciferol (VITAMIN D3) 2000 units capsule Take 1 capsule (2,000 Units total) by mouth daily. 04/05/17  Yes [name], [name], MD  Dietary Management Product (VASCULERA) TABS Take 1 tablet by mouth daily. 04/05/17  Yes [name], [name], MD  enalapril (VASOTEC) 20 MG tablet Take 1 tablet (20 mg total) by mouth daily. 06/07/17  Yes Belva Crome, MD  ezetimibe-simvastatin (VYTORIN) 10-20 MG tablet Take 1 tablet by mouth at bedtime. 04/05/17  Yes [name], [name], MD  fluocinonide cream (LIDEX) 9.93 % Apply 1 application topically 2 (two) times daily. Patient taking differently: Apply 1 application topically 2 (two) times daily as needed (to affected areas of legs).  04/05/17  Yes [name], [name], MD  folic acid (FOLVITE) 1 MG tablet Take 1 tablet (1 mg total) by mouth daily. 12/02/14  Yes Brunetta Genera, MD  guaiFENesin (MUCINEX)  600 MG 12 hr tablet Take 600 mg by mouth 2 (two) times daily.   Yes [provider]  metFORMIN (GLUCOPHAGE-XR) 750 MG 24 hr tablet TAKE 2 TABLETS (1,500 MG TOTAL) BY MOUTH DAILY. 07/03/17  Yes Elayne Snare, MD  NON FORMULARY BiPAP: At bedtime   Yes [provider]  PROAIR HFA 108 (90 BASE) MCG/ACT inhaler Inhale 2 puffs into the lungs every 6 (six) hours as needed (for chest congestion).  03/10/14  Yes [provider]  torsemide (DEMADEX) 20 MG tablet Take 1 tablet (20 mg total) by mouth daily. 05/25/17  Yes Belva Crome, MD  warfarin (COUMADIN) 5 MG tablet TAKE AS DIRECTED BY COUMADIN CLINIC Patient taking differently: Take 2.5-5 mg by mouth See admin instructions. Take 2.5 mg by mouth in the evening on Sun/Wed/Fri and 5 mg on Mon/Tues/Thurs/Sat 03/21/17  Yes Belva Crome, MD  Cyanocobalamin (VITAMIN B-12) 3000 MCG SUBL Take 3,000 mcg by mouth daily.     [provider]  glucose blood (ONE TOUCH ULTRA TEST) test strip USE AS DIRECTED TO TEST BLOOD GLUCOSE  EVERY OTHER DAY DX: 250.00 08/23/16   Elayne Snare, MD  glucose blood (ONETOUCH VERIO) test strip Use as instructed to check blood sugar once daily. Dx Code E11.8 05/07/17   Elayne Snare, MD  NON FORMULARY ONE TOUCH ULTRA TEST STRIPS  AND LANCETS    [provider]  [facility] DELICA LANCETS FINE MISC Use to obtain a blood specimen every day Dx code E11.8 05/07/17   Elayne Snare, MD    Family History Family History  Problem Relation Age of Onset  . Cancer Father        lung   . Hypertension Mother     Social History Social History   Tobacco Use  . Smoking status: Former Smoker    Packs/day: 1.00    Years: 30.00    Pack years: 30.00    Types: Cigarettes    Last attempt to quit: 04/03/1992    Years since quitting: 25.3  . Smokeless tobacco: Never Used  Substance Use Topics  . Alcohol use: Yes    Comment: 4-6 ounces of wine daily  . Drug use: No    Comment: Half a cup a day.     Allergies     Rifampin and Vancomycin   Review of Systems Review of Systems  Constitutional: Negative for fever.  Respiratory: Positive for shortness of breath.   Cardiovascular: Negative for orthopnea and PND.  Musculoskeletal: Negative for neck pain.  All other systems reviewed and are negative.    Physical Exam Updated Vital Signs BP 116/81 (BP Location: Right Arm)   Pulse 86   Temp 97.7 F (36.5 C) (Oral)   Resp 20   Ht 6\' 1"  (1.854 m)   Wt 113.6 kg (250 lb 8 oz)   SpO2 96%   BMI 33.05 kg/m   Physical Exam  Constitutional: He appears well-developed and well-nourished.  HENT:  Head: Normocephalic and atraumatic.  Eyes: Pupils are equal, round, and reactive to light. EOM are normal.  Neck: Normal range of motion.  Cardiovascular: Normal rate.  Pulmonary/Chest: Effort normal. No respiratory distress. He has decreased breath sounds. He has wheezes.  Abdominal: He exhibits no distension.  Musculoskeletal: Normal range of motion.       Left lower leg: He exhibits edema.  Neurological: He is alert.  Skin: Skin is warm and dry.  Nursing note and vitals reviewed.    ED Treatments / Results  Labs (all labs ordered are listed, but only abnormal results are displayed) Labs Reviewed  CBC WITH DIFFERENTIAL/PLATELET - Abnormal; Notable for the following components:      Result Value   WBC 2.5 (*)    RBC 2.65 (*)    Hemoglobin 9.3 (*)    HCT 29.1 (*)    MCV 109.8 (*)    MCH 35.1 (*)    Platelets 59 (*)    Neutro Abs 1.4 (*)    All other components within normal limits  COMPREHENSIVE METABOLIC PANEL - Abnormal; Notable for the following components:   Glucose, Bld 105 (*)    BUN 33 (*)    Creatinine, Ser 1.48 (*)    ALT 16 (*)    Alkaline Phosphatase 183 (*)    Total Bilirubin 1.6 (*)    GFR calc non Af Amer 43 (*)    GFR calc Af Amer 50 (*)    All other components within normal limits  BRAIN NATRIURETIC PEPTIDE - Abnormal; Notable for the following components:   B  Natriuretic Peptide 784.4 (*)    All other components within normal limits  PROTIME-INR - Abnormal; Notable for the following components:   Prothrombin Time 30.5 (*)    All other components within normal limits  TROPONIN I  PHOSPHORUS  MAGNESIUM  PROTIME-INR  CBC WITH DIFFERENTIAL/PLATELET    EKG None  Radiology Dg Chest 2 View  Result Date: 08/01/2017 CLINICAL DATA:  82 year old male with shortness of breath cough congestion and wheezing. EXAM: CHEST - 2 VIEW COMPARISON:  Chest radiographs 07/10/2017 and earlier. FINDINGS: Semi upright AP and lateral views of the chest. Moderate to severe cardiomegaly is stable. Prior CABG. Calcified aortic atherosclerosis. Other mediastinal contours are within normal limits. Visualized tracheal air column is within normal limits. Stable lung volumes. No pneumothorax, pulmonary edema, pleural effusion or confluent pulmonary opacity. No acute osseous abnormality identified. Negative visible bowel gas pattern. IMPRESSION: Stable cardiomegaly. No acute cardiopulmonary abnormality. Electronically Signed   By: Genevie Ann M.D.   On: 08/01/2017 15:42   Ct Chest Wo Contrast  Result Date: 08/01/2017 CLINICAL DATA:  Increase shortness of breath. Hypotension. Chest congestion and cough. EXAM: CT CHEST WITHOUT CONTRAST TECHNIQUE: Multidetector CT imaging of the chest was performed following the standard protocol without IV contrast. COMPARISON:  12/31/2008 FINDINGS: Cardiovascular: The heart is markedly enlarged. Coronary artery calcification is evident. Atherosclerotic calcification is noted in the wall of the thoracic aorta. Mediastinum/Nodes: Scattered borderline enlarged mediastinal lymph nodes are similar to prior. No evidence for gross hilar lymphadenopathy although assessment is limited by the lack of intravenous contrast on today's study. The esophagus has normal imaging features. There is no axillary lymphadenopathy. Lungs/Pleura: Dependent a mucus/debris is seen in  the trachea and left mainstem bronchus. Centrilobular emphysema noted bilaterally. Bronchial wall thickening is noted bilaterally with minimal small airway impaction in the right lower lobe. 5 mm left lower lobe pulmonary nodule is new in the interval. 3 mm calcified granuloma in  the left lower lobe (78/5) is stable no pulmonary edema or pleural effusion. Upper Abdomen: Small volume ascites is noted in the upper abdomen. Musculoskeletal: Bone windows reveal no worrisome lytic or sclerotic osseous lesions. IMPRESSION: 1. No acute findings in the chest. 2. There is some minimal dependent mucus/fluid/debris in the trachea and left mainstem bronchus. 3. 5 mm left lower lobe pulmonary nodule, new since prior study. No follow-up needed if patient is low-risk. Non-contrast chest CT can be considered in 12 months if patient is high-risk. This recommendation follows the consensus statement: Guidelines for Management of Incidental Pulmonary Nodules Detected on CT Images: From the Fleischner Society 2017; Radiology 2017; 284:228-243. 4. Small volume ascites noted in the upper abdomen. Electronically Signed   By: Misty Stanley M.D.   On: 08/01/2017 18:23    Procedures Procedures (including critical care time)  CRITICAL CARE Performed by: Merrily Pew Total critical care time: 35 minutes Critical care time was exclusive of separately billable procedures and treating other patients. Critical care was necessary to treat or prevent imminent or life-threatening deterioration. Critical care was time spent personally by me on the following activities: development of treatment plan with patient and/or surrogate as well as nursing, discussions with consultants, evaluation of patient's response to treatment, examination of patient, obtaining history from patient or surrogate, ordering and performing treatments and interventions, ordering and review of laboratory studies, ordering and review of radiographic studies, pulse  oximetry and re-evaluation of patient's condition.   Medications Ordered in ED Medications  methylPREDNISolone sodium succinate (SOLU-MEDROL) 125 mg/2 mL injection 125 mg (has no administration in time range)  ipratropium-albuterol (DUONEB) 0.5-2.5 (3) MG/3ML nebulizer solution 3 mL (has no administration in time range)  carvedilol (COREG) tablet 25 mg (has no administration in time range)  cholecalciferol (VITAMIN D) tablet 2,000 Units (has no administration in time range)  vitamin B-12 (CYANOCOBALAMIN) tablet 3,000 mcg (has no administration in time range)  ezetimibe-simvastatin (VYTORIN) 10-20 MG per tablet 1 tablet (has no administration in time range)  folic acid (FOLVITE) tablet 1 mg (has no administration in time range)  guaiFENesin (MUCINEX) 12 hr tablet 600 mg (has no administration in time range)  torsemide (DEMADEX) tablet 20 mg (has no administration in time range)  metFORMIN (GLUCOPHAGE-XR) 24 hr tablet 1,500 mg (has no administration in time range)  warfarin (COUMADIN) tablet 2.5 mg (has no administration in time range)  Warfarin - Pharmacist Dosing Inpatient (has no administration in time range)  ondansetron (ZOFRAN) tablet 4 mg (has no administration in time range)    Or  ondansetron (ZOFRAN) injection 4 mg (has no administration in time range)  albuterol (PROVENTIL) (2.5 MG/3ML) 0.083% nebulizer solution 2.5 mg (has no administration in time range)  albuterol (PROVENTIL) (2.5 MG/3ML) 0.083% nebulizer solution 5 mg (5 mg Nebulization Given 08/01/17 1445)  ipratropium-albuterol (DUONEB) 0.5-2.5 (3) MG/3ML nebulizer solution 3 mL (3 mLs Nebulization Given 08/01/17 1642)  methylPREDNISolone sodium succinate (SOLU-MEDROL) 125 mg/2 mL injection 125 mg (125 mg Intravenous Given 08/01/17 1805)  magnesium sulfate IVPB 2 g 50 mL (0 g Intravenous Stopped 08/01/17 2052)  azithromycin (ZITHROMAX) 500 mg in sodium chloride 0.9 % 250 mL IVPB (0 mg Intravenous Stopped 08/01/17 2052)  furosemide  (LASIX) injection 20 mg (20 mg Intravenous Given 08/01/17 2143)     Initial Impression / Assessment and Plan / ED Course  I have reviewed the triage vital signs and the nursing notes.  Pertinent labs & imaging results that were available during my care of the  patient were reviewed by me and considered in my medical decision making (see chart for details).     With diminished breath sounds and wheezing initially thought to be a COPD exacerbation however he does have lower extremity edema but he has venous stasis changes consistent with the same.  Improved pretty significantly with duo nebs and steroids but did have elevated BNP as well.  Has had some soft pressures are just ordered 20 of IV Lasix for now.  Will consult medicine for admission as he gets hypoxic when he walks.  Final Clinical Impressions(s) / ED Diagnoses   Final diagnoses:  Hypervolemia, unspecified hypervolemia type  Hypoxia  Dyspnea on exertion    ED Discharge Orders    None       Iara Monds, Corene Cornea, MD 08/01/17 3968    Merrily Pew, MD 08/12/17 8648

## 2017-08-01 NOTE — Progress Notes (Signed)
ANTICOAGULATION CONSULT NOTE - Initial Consult  Pharmacy Consult for Warfarin Indication: atrial fibrillation  Allergies  Allergen Reactions  . Rifampin Rash    May have been caused by Vancomycin or Rifampin (??)  . Vancomycin Rash    May have been caused by Vancomycin or Rifampin (??)    Patient Measurements: Height: 6\' 1"  (185.4 cm) Weight: 250 lb 8 oz (113.6 kg) IBW/kg (Calculated) : 79.9   Vital Signs: Temp: 97.7 F (36.5 C) (05/01 2321) Temp Source: Oral (05/01 2321) BP: 116/81 (05/01 2321) Pulse Rate: 86 (05/01 2321)  Labs: Recent Labs    08/01/17 1136 08/01/17 1804 08/01/17 2238  HGB 9.3* 9.3*  --   HCT 29.2* 29.1*  --   PLT 56* 59*  --   LABPROT  --   --  30.5*  INR  --   --  2.95  CREATININE 1.58* 1.48*  --   TROPONINI  --  <0.03  --     Estimated Creatinine Clearance: 52.6 mL/min (A) (by C-G formula based on SCr of 1.48 mg/dL (H)).   Medical History: Past Medical History:  Diagnosis Date  . Aortic stenosis   . Carotid artery disease ([place]) 1994   s/p left carotid endarerectomy   . CHF with unknown LVEF (HCC)    47 %  . Chronic atrial fibrillation ([place])   . Hx of CABG   . Hypertension   . Left ventricular dysfunction 2007   LVEF 47%   . OSA (obstructive sleep apnea)   . Rheumatic fever   . Subclavian bypass stenosis ([place])   . Vitamin B12 deficiency     Medications:  Scheduled:  . [START ON 08/02/2017] carvedilol  25 mg Oral BID WC  . [START ON 08/02/2017] cholecalciferol  2,000 Units Oral Daily  . ezetimibe-simvastatin  1 tablet Oral QHS  . [START ON 07/03/7406] folic acid  1 mg Oral Daily  . guaiFENesin  600 mg Oral BID  . [START ON 08/02/2017] ipratropium-albuterol  3 mL Nebulization Q6H  . [START ON 08/02/2017] metFORMIN  1,500 mg Oral Daily  . [START ON 08/02/2017] methylPREDNISolone (SOLU-MEDROL) injection  125 mg Intravenous Q6H  . [START ON 08/02/2017] torsemide  20 mg Oral Daily  . [START ON 08/02/2017] vitamin B-12  3,000 mcg Oral Daily  .  warfarin  2.5 mg Oral Once  . [START ON 08/02/2017] Warfarin - Pharmacist Dosing Inpatient   Does not apply q1800   Infusions:    Assessment: 68 yoM with increase SOB, chest congestion and cough on chronic warfarin for a-fib. HD 2.5 mg Su/W/F and 5 mg M/Tu/Th/Sa. INR=2.95 on admission.  Goal of Therapy:  INR 2-3    Plan:  Warfarin 2.5 mg x1 Daily PT/INR  Dorrene German 08/01/2017,11:34 PM

## 2017-08-01 NOTE — Telephone Encounter (Signed)
Printed calender and  mailing to patient . Per 5/1 los

## 2017-08-01 NOTE — ED Triage Notes (Signed)
[facility] from [facility] for routine blood work follow up. Pt c/o increased shortness of breath, chest congestion, and cough. At cancer center pt was seen hypotensive and had a low platelet count. Pt A/Ox4.

## 2017-08-01 NOTE — Telephone Encounter (Signed)
Pt. Requesting refill of ProAir inhaler. Wife reports he's "having problems with allergies." Uses [facility] .

## 2017-08-01 NOTE — Telephone Encounter (Signed)
Copied from [facility] [phone]. Topic: Quick Communication - Rx Refill/Question >> Aug 01, 2017  9:00 AM Synthia Innocent wrote: Medication: PROAIR HFA 108 (90 BASE) MCG/ACT inhaler Has the patient contacted their pharmacy? Yes.   (Agent: If no, request that the patient contact the pharmacy for the refill.) Preferred Pharmacy (with phone number or street name): [facility] Agent: Please be advised that RX refills may take up to 3 business days. We ask that you follow-up with your pharmacy.

## 2017-08-01 NOTE — ED Notes (Signed)
Ambulated pt in hallway. Maintained at 82%, dropped down to 78%

## 2017-08-01 NOTE — Progress Notes (Signed)
Per MD assessment, concern that pt may have pneumonia. Pt hypotensive and SOB with increasing nose bleeds. Pt taken to the ED for further management. Report given to [facility], Therapist, sports. Pt wife made aware of decision to go to the ED based on MD recommendation.

## 2017-08-01 NOTE — H&P (Signed)
History and Physical    [name]:[id number] DOB: [birth year] DOA: 08/01/2017  PCP: Andrew Anger, MD  Patient coming from: Home via cancer center.  I have personally briefly reviewed patient's old medical records in [place]  Chief Complaint: Shortness of breath.  HPI: [name] with medical history significant of aortic stenosis, coronary artery disease, history of left carotid endarterectomy, history systolic CHF, chronic atrial fibrillation, CAD, history of CABG, rheumatic fever, subclavian bypasses stenosis, obstructive sleep apnea, not on CPAP, vitamin B12 deficiency who was referred from the cancer center after becoming increasingly more dyspneic and having persistent cough.  Per patient and his wife [name], they had a cold with similar symptoms around the same time in March.  A couple of weeks ago, they developed runny nose sore throat with cough, but not sure this was a call or due to allergies.  However, since then,, the patient has been progressively more dyspneic, having wheezing, productive cough for the yellowish/brownish sputum, fatigue and malaise.  He denies fever, chills, night sweats or sore throat at this time.  No chest pain, palpitations, dizziness, diaphoresis, but has had frequent pitting edema of the lower extremities recently.  No abdominal pain, nausea, emesis, diarrhea, constipation, melena or hematochezia denies dysuria, frequency or hematuria.  No polyuria, polydipsia or polyphagia.  Denies heat or cold intolerance.  Denies pruritus or skin rashes.  ED Course: Initial vital signs where temperature 97.9 F, pulse 78, respiration 20, blood pressure 117/62 mmHg and O2 sat 89% on room air.  He received supplemental oxygen, azithromycin, furosemide 20 mg IVP x1, bronchodilators and Solu-Medrol in the emergency department.  His work-up shows white count was 2.5, hemoglobin 9.3 and platelets of 59.  WBC and platelets are  decreased from a month ago.  PT was 30.5 seconds and INR 2.95.Troponin was normal.  CMP showed a glucose of 105, BUN 33 and creatinine 1.48 mg/dL.  Total bilirubin was 1.6 mg/dL, ALT was 16 and alkaline phosphatase 183 U/L, all other hepatic function tests were within normal limits.  Review of Systems: As per HPI otherwise 10 point review of systems negative.   Past Medical History:  Diagnosis Date  . Aortic stenosis   . Carotid artery disease ([place]) 1994   s/p left carotid endarerectomy   . CHF with unknown LVEF (HCC)    47 %  . Chronic atrial fibrillation ([place])   . Hx of CABG   . Hypertension   . Left ventricular dysfunction 2007   LVEF 47%   . OSA (obstructive sleep apnea)   . Rheumatic fever   . Subclavian bypass stenosis ([place])   . Vitamin B12 deficiency     Past Surgical History:  Procedure Laterality Date  . [place]   replaced due to aortic stenosis, [employer] mechanical prostesis  . CAROTID ENDARTERECTOMY Left 1997   subclavian bypass Done in [place]  . CORONARY ARTERY BYPASS GRAFT  1994   w SVG to RCA and SVG to circumflex  . heart bypass     Done in [place]     reports that he quit smoking about 25 years ago. His smoking use included cigarettes. He has a 30.00 pack-year smoking history. He has never used smokeless tobacco. He reports that he drinks alcohol. He reports that he does not use drugs.  Allergies  Allergen Reactions  . Rifampin Rash    May have been caused by Vancomycin or Rifampin (??)  .  Vancomycin Rash    May have been caused by Vancomycin or Rifampin (??)    Family History  Problem Relation Age of Onset  . Cancer Father        lung   . Hypertension Mother     Prior to Admission medications   Medication Sig Start Date End Date Taking? Authorizing Provider  acetaminophen (TYLENOL) 500 MG tablet Take 500-1,000 mg by mouth daily as needed (for pain).    Yes [provider]  carvedilol (COREG) 25 MG tablet Take 1  tablet (25 mg total) by mouth 2 (two) times daily with a meal. 04/12/17  Yes Andrew Crome, MD  Cholecalciferol (VITAMIN D3) 2000 units capsule Take 1 capsule (2,000 Units total) by mouth daily. 04/05/17  Yes [name], [name], MD  Dietary Management Product (VASCULERA) TABS Take 1 tablet by mouth daily. 04/05/17  Yes [name], [name], MD  enalapril (VASOTEC) 20 MG tablet Take 1 tablet (20 mg total) by mouth daily. 06/07/17  Yes Andrew Crome, MD  ezetimibe-simvastatin (VYTORIN) 10-20 MG tablet Take 1 tablet by mouth at bedtime. 04/05/17  Yes [name], [name], MD  fluocinonide cream (LIDEX) 9.67 % Apply 1 application topically 2 (two) times daily. Patient taking differently: Apply 1 application topically 2 (two) times daily as needed (to affected areas of legs).  04/05/17  Yes [name], [name], MD  folic acid (FOLVITE) 1 MG tablet Take 1 tablet (1 mg total) by mouth daily. 12/02/14  Yes Andrew Genera, MD  guaiFENesin (MUCINEX) 600 MG 12 hr tablet Take 600 mg by mouth 2 (two) times daily.   Yes [provider]  metFORMIN (GLUCOPHAGE-XR) 750 MG 24 hr tablet TAKE 2 TABLETS (1,500 MG TOTAL) BY MOUTH DAILY. 07/03/17  Yes Andrew Snare, MD  NON FORMULARY BiPAP: At bedtime   Yes [provider]  PROAIR HFA 108 (90 BASE) MCG/ACT inhaler Inhale 2 puffs into the lungs every 6 (six) hours as needed (for chest congestion).  03/10/14  Yes [provider]  torsemide (DEMADEX) 20 MG tablet Take 1 tablet (20 mg total) by mouth daily. 05/25/17  Yes Andrew Crome, MD  warfarin (COUMADIN) 5 MG tablet TAKE AS DIRECTED BY COUMADIN CLINIC Patient taking differently: Take 2.5-5 mg by mouth See admin instructions. Take 2.5 mg by mouth in the evening on Sun/Wed/Fri and 5 mg on Mon/Tues/Thurs/Sat 03/21/17  Yes Andrew Crome, MD  Cyanocobalamin (VITAMIN B-12) 3000 MCG SUBL Take 3,000 mcg by mouth daily.     [provider]  glucose blood (ONE TOUCH ULTRA TEST) test strip USE AS  DIRECTED TO TEST BLOOD GLUCOSE  EVERY OTHER DAY DX: 250.00 08/23/16   Andrew Snare, MD  glucose blood (ONETOUCH VERIO) test strip Use as instructed to check blood sugar once daily. Dx Code E11.8 05/07/17   Andrew Snare, MD  NON FORMULARY ONE TOUCH ULTRA TEST STRIPS  AND LANCETS    [provider]  [facility] DELICA LANCETS FINE MISC Use to obtain a blood specimen every day Dx code E11.8 05/07/17   Andrew Snare, MD    Physical Exam: Vitals:   08/01/17 2200 08/01/17 2230 08/01/17 2317 08/01/17 2321  BP: 128/86 124/79  116/81  Pulse: 89 83  86  Resp: 17 16  20   Temp:    97.7 F (36.5 C)  TempSrc:    Oral  SpO2: 94% 98%  96%  Weight:   113.6 kg (250 lb 8 oz)   Height:   6'  1" (1.854 m)     Constitutional: NAD, calm, comfortable Eyes: PERRL, lids and conjunctivae normal ENMT: Mucous membranes are moist. Posterior pharynx clear of any exudate or lesions. Neck: Normal, supple, no masses, no thyromegaly. Respiratory: Decreased breath sounds with mild rhonchi and bilateral wheezing. Normal respiratory effort. No accessory muscle use.  Cardiovascular: Irregularly irregular rhythm with a heart rate in the 80s, no murmurs / rubs / gallops.  1+ lower extremities pitting edema.  Stage II lymphedema.  2+ pedal pulses. No carotid bruits.  Abdomen: Obese, soft, no tenderness, no masses palpated. No hepatosplenomegaly. Bowel sounds positive.  Musculoskeletal: no clubbing / cyanosis.  Good ROM, no contractures. Normal muscle tone.  Skin: hyperpigmented macules on torso and extremities Neurologic: CN 2-12 grossly intact. Sensation intact, DTR normal. Strength 5/5 in all 4.  Psychiatric: Normal judgment and insight. Alert and oriented x 4. Normal mood.    Labs on Admission: I have personally reviewed following labs and imaging studies  CBC: Recent Labs  Lab 08/01/17 1136 08/01/17 1804  WBC 2.6* 2.5*  NEUTROABS 1.7 1.4*  HGB 9.3* 9.3*  HCT 29.2* 29.1*  MCV 109.0* 109.8*  PLT 56* 59*   Basic  Metabolic Panel: Recent Labs  Lab 07/27/17 1159 08/01/17 1136 08/01/17 1804 08/01/17 2238  NA 140 136 138  --   K 4.8 4.2 4.1  --   CL 102 105 104  --   CO2 21 22 22   --   GLUCOSE 91 111 105*  --   BUN 44* 34* 33*  --   CREATININE 1.55* 1.58* 1.48*  --   CALCIUM 9.5 9.1 9.0  --   MG  --   --   --  2.1  PHOS  --   --   --  3.3   GFR: Estimated Creatinine Clearance: 52.6 mL/min (A) (by C-G formula based on SCr of 1.48 mg/dL (H)). Liver Function Tests: Recent Labs  Lab 08/01/17 1136 08/01/17 1804  AST 25 28  ALT 13 16*  ALKPHOS 208* 183*  BILITOT 1.1 1.6*  PROT 7.1 7.5  ALBUMIN 3.6 3.5   No results for input(s): LIPASE, AMYLASE in the last 168 hours. No results for input(s): AMMONIA in the last 168 hours. Coagulation Profile: Recent Labs  Lab 08/01/17 2238  INR 2.95   Cardiac Enzymes: Recent Labs  Lab 08/01/17 1804  TROPONINI <0.03   BNP (last 3 results) Recent Labs    03/30/17 1147 05/25/17 1044 07/10/17 1523  PROBNP 1,858* 2,749* 411.0*   HbA1C: No results for input(s): HGBA1C in the last 72 hours. CBG: No results for input(s): GLUCAP in the last 168 hours. Lipid Profile: No results for input(s): CHOL, HDL, LDLCALC, TRIG, CHOLHDL, LDLDIRECT in the last 72 hours. Thyroid Function Tests: No results for input(s): TSH, T4TOTAL, FREET4, T3FREE, THYROIDAB in the last 72 hours. Anemia Panel: Recent Labs    08/01/17 1136 08/01/17 1137  VITAMINB12 1,787*  --   FERRITIN  --  114  RETICCTPCT 3.4*  --    Urine analysis:    Component Value Date/Time   COLORURINE YELLOW 02/09/2014 0848   APPEARANCEUR CLEAR 02/09/2014 0848   LABSPEC 1.025 02/09/2014 0848   PHURINE 5.5 02/09/2014 0848   GLUCOSEU NEGATIVE 02/09/2014 0848   HGBUR SMALL (A) 02/09/2014 0848   BILIRUBINUR Neg 08/14/2014 1051   KETONESUR NEGATIVE 02/09/2014 0848   PROTEINUR 30+ 08/14/2014 1051   PROTEINUR 30 (A) 02/04/2009 1045   UROBILINOGEN 0.2 08/14/2014 1051   UROBILINOGEN 0.2  02/09/2014 0848   NITRITE Neg 08/14/2014 1051   NITRITE NEGATIVE 02/09/2014 0848   LEUKOCYTESUR Negative 08/14/2014 1051    Radiological Exams on Admission: Dg Chest 2 View  Result Date: 08/01/2017 CLINICAL DATA:  82 year old Beard with shortness of breath cough congestion and wheezing. EXAM: CHEST - 2 VIEW COMPARISON:  Chest radiographs 07/10/2017 and earlier. FINDINGS: Semi upright AP and lateral views of the chest. Moderate to severe cardiomegaly is stable. Prior CABG. Calcified aortic atherosclerosis. Other mediastinal contours are within normal limits. Visualized tracheal air column is within normal limits. Stable lung volumes. No pneumothorax, pulmonary edema, pleural effusion or confluent pulmonary opacity. No acute osseous abnormality identified. Negative visible bowel gas pattern. IMPRESSION: Stable cardiomegaly. No acute cardiopulmonary abnormality. Electronically Signed   By: Genevie Ann M.D.   On: 08/01/2017 15:42   Ct Chest Wo Contrast  Result Date: 08/01/2017 CLINICAL DATA:  Increase shortness of breath. Hypotension. Chest congestion and cough. EXAM: CT CHEST WITHOUT CONTRAST TECHNIQUE: Multidetector CT imaging of the chest was performed following the standard protocol without IV contrast. COMPARISON:  12/31/2008 FINDINGS: Cardiovascular: The heart is markedly enlarged. Coronary artery calcification is evident. Atherosclerotic calcification is noted in the wall of the thoracic aorta. Mediastinum/Nodes: Scattered borderline enlarged mediastinal lymph nodes are similar to prior. No evidence for gross hilar lymphadenopathy although assessment is limited by the lack of intravenous contrast on today's study. The esophagus has normal imaging features. There is no axillary lymphadenopathy. Lungs/Pleura: Dependent a mucus/debris is seen in the trachea and left mainstem bronchus. Centrilobular emphysema noted bilaterally. Bronchial wall thickening is noted bilaterally with minimal small airway impaction  in the right lower lobe. 5 mm left lower lobe pulmonary nodule is new in the interval. 3 mm calcified granuloma in the left lower lobe (78/5) is stable no pulmonary edema or pleural effusion. Upper Abdomen: Small volume ascites is noted in the upper abdomen. Musculoskeletal: Bone windows reveal no worrisome lytic or sclerotic osseous lesions. IMPRESSION: 1. No acute findings in the chest. 2. There is some minimal dependent mucus/fluid/debris in the trachea and left mainstem bronchus. 3. 5 mm left lower lobe pulmonary nodule, new since prior study. No follow-up needed if patient is low-risk. Non-contrast chest CT can be considered in 12 months if patient is high-risk. This recommendation follows the consensus statement: Guidelines for Management of Incidental Pulmonary Nodules Detected on CT Images: From the Fleischner Society 2017; Radiology 2017; 284:228-243. 4. Small volume ascites noted in the upper abdomen. Electronically Signed   By: Misty Stanley M.D.   On: 08/01/2017 18:23    EKG: Independently reviewed. Vent. rate 75 BPM PR interval * ms QRS duration 118 ms QT/QTc 415/464 ms P-R-T axes * 74 220 Atrial fibrillation Nonspecific intraventricular conduction delay Borderline repolarization abnormality  Assessment/Plan Principal Problem:   COPD exacerbation (HCC) Observation/telemetry. Continue supplemental oxygen. DuoNeb every 6 hours. Albuterol 2.5 mg nebs every 4 hours as needed. Solu-Medrol 40 mg IVP every 6 hours for 24 hours. Switch to oral prednisone or continue Solu-Medrol depending on clinical response.  Active Problems:   HLD (hyperlipidemia) Continue Vytorin. Monitor LFTs as needed. Fasting lipids monitoring as outpatient.    Essential hypertension Continue carvedilol 25 mg p.o. twice daily. Also on torsemide 20 mg p.o. daily. Monitor blood pressure, heart rate, BUN, creatinine and electrolytes.    Coronary atherosclerosis Denies chest pain at this time. On  carvedilol, Vytorin and warfarin.    Atrial fibrillation (HCC) CHA?DS?-VASc Score of at least 6. Continue carvedilol 25 mg p.o.  twice daily. Warfarin per pharmacy.    Type II diabetes mellitus with complication (HCC) Hemoglobin A1c 5.6% on 02/21/2017. Carbohydrate modified diet. Continue metformin XR 1500 mg p.o. daily. CBG monitoring with regular insulin sliding scale while in the hospital.    Pancytopenia ([place]) Follow-up CBC. Follow-up with hematology/oncology as scheduled.   DVT prophylaxis: On warfarin. Code Status: Full code. Family Communication: His wife [name] was present in the ED. Disposition Plan: 24 to 48-hour observation for COPD exacerbation treatment. Consults called: Admission status: Inpatient/telemetry.   Reubin Milan MD [facility] Pager [phone].  If 7PM-7AM, please contact night-coverage [email or web address] Password [facility]  08/01/2017, 11:44 PM

## 2017-08-01 NOTE — ED Notes (Signed)
Bed: WA03 Expected date:  Expected time:  Means of arrival:  Comments: 

## 2017-08-01 NOTE — ED Notes (Signed)
PA student at bedside.

## 2017-08-01 NOTE — ED Notes (Signed)
ED TO INPATIENT HANDOFF REPORT  Name/Age/Gender [name] 82 y.o. male  Code Status   Home/SNF/Other Home  Chief Complaint Sent from [place]  Level of Care/Admitting Diagnosis ED Disposition    ED Disposition Condition [place]: [place] [100102]  Level of Care: Telemetry [5]  Admit to tele based on following criteria: Monitor for Ischemic changes  Diagnosis: COPD exacerbation [facility]) [[id number]]  Admitting Physician: Reubin Milan [[id number]]  Attending Physician: Reubin Milan [[id number]]  PT Class (Do Not Modify): Observation [104]  PT Acc Code (Do Not Modify): Observation [10022]       Medical History Past Medical History:  Diagnosis Date  . Aortic stenosis   . Carotid artery disease ([place]) 1994   s/p left carotid endarerectomy   . CHF with unknown LVEF (HCC)    47 %  . Chronic atrial fibrillation ([place])   . Hx of CABG   . Hypertension   . Left ventricular dysfunction 2007   LVEF 47%   . OSA (obstructive sleep apnea)   . Rheumatic fever   . Subclavian bypass stenosis ([place])   . Vitamin B12 deficiency     Allergies Allergies  Allergen Reactions  . Rifampin Rash    May have been caused by Vancomycin or Rifampin (??)  . Vancomycin Rash    May have been caused by Vancomycin or Rifampin (??)    IV Location/Drains/Wounds Patient Lines/Drains/Airways Status   Active Line/Drains/Airways    Name:   Placement date:   Placement time:   Site:   Days:   Peripheral IV 08/01/17 Left;Medial Arm   08/01/17    1452    Arm   less than 1          Labs/Imaging Results for orders placed or performed during the hospital encounter of 08/01/17 (from the past 48 hour(s))  CBC with Differential     Status: Abnormal   Collection Time: 08/01/17  6:04 PM  Result Value Ref Range   WBC 2.5 (L) 4.0 - 10.5 K/uL   RBC 2.65 (L) 4.22 - 5.81 MIL/uL   Hemoglobin 9.3 (L) 13.0 - 17.0 g/dL   HCT 29.1 (L) 39.0 - 52.0 %   MCV  109.8 (H) 78.0 - 100.0 fL   MCH 35.1 (H) 26.0 - 34.0 pg   MCHC 32.0 30.0 - 36.0 g/dL   RDW 15.4 11.5 - 15.5 %   Platelets 59 (L) 150 - 400 K/uL    Comment: REPEATED TO VERIFY SPECIMEN CHECKED FOR CLOTS PLATELET COUNT CONFIRMED BY SMEAR    Neutrophils Relative % 58 %   Neutro Abs 1.4 (L) 1.7 - 7.7 K/uL   Lymphocytes Relative 31 %   Lymphs Abs 0.8 0.7 - 4.0 K/uL   Monocytes Relative 10 %   Monocytes Absolute 0.3 0.1 - 1.0 K/uL   Eosinophils Relative 0 %   Eosinophils Absolute 0.0 0.0 - 0.7 K/uL   Basophils Relative 1 %   Basophils Absolute 0.0 0.0 - 0.1 K/uL    Comment: Performed at [facility], [place] [address] [place], [place] [address]  Comprehensive metabolic panel     Status: Abnormal   Collection Time: 08/01/17  6:04 PM  Result Value Ref Range   Sodium 138 135 - 145 mmol/L   Potassium 4.1 3.5 - 5.1 mmol/L   Chloride 104 101 - 111 mmol/L   CO2 22 22 - 32 mmol/L   Glucose, Bld 105 (H) 65 -  99 mg/dL   BUN 33 (H) 6 - 20 mg/dL   Creatinine, Ser 1.48 (H) 0.61 - 1.24 mg/dL   Calcium 9.0 8.9 - 10.3 mg/dL   Total Protein 7.5 6.5 - 8.1 g/dL   Albumin 3.5 3.5 - 5.0 g/dL   AST 28 15 - 41 U/L   ALT 16 (L) 17 - 63 U/L   Alkaline Phosphatase 183 (H) 38 - 126 U/L   Total Bilirubin 1.6 (H) 0.3 - 1.2 mg/dL   GFR calc non Af Amer 43 (L) >60 mL/min   GFR calc Af Amer 50 (L) >60 mL/min    Comment: (NOTE) The eGFR has been calculated using the CKD EPI equation. This calculation has not been validated in all clinical situations. eGFR's persistently <60 mL/min signify possible Chronic Kidney Disease.    Anion gap 12 5 - 15    Comment: Performed at [facility], [place] [address], [place], [place] [address]  Brain natriuretic peptide     Status: Abnormal   Collection Time: 08/01/17  6:04 PM  Result Value Ref Range   B Natriuretic Peptide 784.4 (H) 0.0 - 100.0 pg/mL    Comment: Performed at [facility], [place] [address]  [place], [place] [address]  Troponin I     Status: None   Collection Time: 08/01/17  6:04 PM  Result Value Ref Range   Troponin I <0.03 <0.03 ng/mL    Comment: Performed at [facility], [place] [address], [place],  [address]   Dg Chest 2 View  Result Date: 08/01/2017 CLINICAL DATA:  82 year old male with shortness of breath cough congestion and wheezing. EXAM: CHEST - 2 VIEW COMPARISON:  Chest radiographs 07/10/2017 and earlier. FINDINGS: Semi upright AP and lateral views of the chest. Moderate to severe cardiomegaly is stable. Prior CABG. Calcified aortic atherosclerosis. Other mediastinal contours are within normal limits. Visualized tracheal air column is within normal limits. Stable lung volumes. No pneumothorax, pulmonary edema, pleural effusion or confluent pulmonary opacity. No acute osseous abnormality identified. Negative visible bowel gas pattern. IMPRESSION: Stable cardiomegaly. No acute cardiopulmonary abnormality. Electronically Signed   By: Genevie Ann M.D.   On: 08/01/2017 15:42   Ct Chest Wo Contrast  Result Date: 08/01/2017 CLINICAL DATA:  Increase shortness of breath. Hypotension. Chest congestion and cough. EXAM: CT CHEST WITHOUT CONTRAST TECHNIQUE: Multidetector CT imaging of the chest was performed following the standard protocol without IV contrast. COMPARISON:  12/31/2008 FINDINGS: Cardiovascular: The heart is markedly enlarged. Coronary artery calcification is evident. Atherosclerotic calcification is noted in the wall of the thoracic aorta. Mediastinum/Nodes: Scattered borderline enlarged mediastinal lymph nodes are similar to prior. No evidence for gross hilar lymphadenopathy although assessment is limited by the lack of intravenous contrast on today's study. The esophagus has normal imaging features. There is no axillary lymphadenopathy. Lungs/Pleura: Dependent a mucus/debris is seen in the trachea and left mainstem bronchus. Centrilobular emphysema noted  bilaterally. Bronchial wall thickening is noted bilaterally with minimal small airway impaction in the right lower lobe. 5 mm left lower lobe pulmonary nodule is new in the interval. 3 mm calcified granuloma in the left lower lobe (78/5) is stable no pulmonary edema or pleural effusion. Upper Abdomen: Small volume ascites is noted in the upper abdomen. Musculoskeletal: Bone windows reveal no worrisome lytic or sclerotic osseous lesions. IMPRESSION: 1. No acute findings in the chest. 2. There is some minimal dependent mucus/fluid/debris in the trachea and left mainstem bronchus. 3. 5 mm left lower  lobe pulmonary nodule, new since prior study. No follow-up needed if patient is low-risk. Non-contrast chest CT can be considered in 12 months if patient is high-risk. This recommendation follows the consensus statement: Guidelines for Management of Incidental Pulmonary Nodules Detected on CT Images: From the Fleischner Society 2017; Radiology 2017; 284:228-243. 4. Small volume ascites noted in the upper abdomen. Electronically Signed   By: Misty Beard M.D.   On: 08/01/2017 18:23    Pending Labs Unresulted Labs (From admission, onward)   Start     Ordered   08/01/17 2155  Phosphorus  Add-on,   R     08/01/17 2155   08/01/17 2155  Magnesium  Add-on,   R     08/01/17 2155      Vitals/Pain Today's Vitals   08/01/17 2110 08/01/17 2130 08/01/17 2141 08/01/17 2200  BP: (!) 112/58 110/70 110/70 128/86  Pulse: 80 79 77 89  Resp: (!) 24 19 (!) 24 17  Temp:      TempSrc:      SpO2: 94% 94% 91% 94%  Weight:      Height:      PainSc:        Isolation Precautions No active isolations  Medications Medications  methylPREDNISolone sodium succinate (SOLU-MEDROL) 125 mg/2 mL injection 125 mg (has no administration in time range)  ipratropium-albuterol (DUONEB) 0.5-2.5 (3) MG/3ML nebulizer solution 3 mL (has no administration in time range)  albuterol (PROVENTIL) (2.5 MG/3ML) 0.083% nebulizer solution 5 mg  (5 mg Nebulization Given 08/01/17 1445)  ipratropium-albuterol (DUONEB) 0.5-2.5 (3) MG/3ML nebulizer solution 3 mL (3 mLs Nebulization Given 08/01/17 1642)  methylPREDNISolone sodium succinate (SOLU-MEDROL) 125 mg/2 mL injection 125 mg (125 mg Intravenous Given 08/01/17 1805)  magnesium sulfate IVPB 2 g 50 mL (0 g Intravenous Stopped 08/01/17 2052)  azithromycin (ZITHROMAX) 500 mg in sodium chloride 0.9 % 250 mL IVPB (0 mg Intravenous Stopped 08/01/17 2052)  furosemide (LASIX) injection 20 mg (20 mg Intravenous Given 08/01/17 2143)    Mobility walks

## 2017-08-01 NOTE — ED Notes (Signed)
Patient transported to X-ray 

## 2017-08-02 ENCOUNTER — Encounter (HOSPITAL_COMMUNITY): Payer: Self-pay

## 2017-08-02 ENCOUNTER — Telehealth: Payer: Self-pay | Admitting: *Deleted

## 2017-08-02 DIAGNOSIS — E1122 Type 2 diabetes mellitus with diabetic chronic kidney disease: Secondary | ICD-10-CM | POA: Diagnosis present

## 2017-08-02 DIAGNOSIS — Z881 Allergy status to other antibiotic agents status: Secondary | ICD-10-CM | POA: Diagnosis not present

## 2017-08-02 DIAGNOSIS — Z8249 Family history of ischemic heart disease and other diseases of the circulatory system: Secondary | ICD-10-CM | POA: Diagnosis not present

## 2017-08-02 DIAGNOSIS — G4733 Obstructive sleep apnea (adult) (pediatric): Secondary | ICD-10-CM | POA: Diagnosis present

## 2017-08-02 DIAGNOSIS — I5023 Acute on chronic systolic (congestive) heart failure: Secondary | ICD-10-CM | POA: Diagnosis present

## 2017-08-02 DIAGNOSIS — E538 Deficiency of other specified B group vitamins: Secondary | ICD-10-CM | POA: Diagnosis present

## 2017-08-02 DIAGNOSIS — Z952 Presence of prosthetic heart valve: Secondary | ICD-10-CM | POA: Diagnosis not present

## 2017-08-02 DIAGNOSIS — J9621 Acute and chronic respiratory failure with hypoxia: Secondary | ICD-10-CM | POA: Diagnosis present

## 2017-08-02 DIAGNOSIS — Z801 Family history of malignant neoplasm of trachea, bronchus and lung: Secondary | ICD-10-CM | POA: Diagnosis not present

## 2017-08-02 DIAGNOSIS — I10 Essential (primary) hypertension: Secondary | ICD-10-CM

## 2017-08-02 DIAGNOSIS — I482 Chronic atrial fibrillation: Secondary | ICD-10-CM

## 2017-08-02 DIAGNOSIS — D61818 Other pancytopenia: Secondary | ICD-10-CM | POA: Diagnosis present

## 2017-08-02 DIAGNOSIS — R0902 Hypoxemia: Secondary | ICD-10-CM

## 2017-08-02 DIAGNOSIS — R0609 Other forms of dyspnea: Secondary | ICD-10-CM | POA: Diagnosis not present

## 2017-08-02 DIAGNOSIS — Z87891 Personal history of nicotine dependence: Secondary | ICD-10-CM | POA: Diagnosis not present

## 2017-08-02 DIAGNOSIS — Z951 Presence of aortocoronary bypass graft: Secondary | ICD-10-CM | POA: Diagnosis not present

## 2017-08-02 DIAGNOSIS — R911 Solitary pulmonary nodule: Secondary | ICD-10-CM | POA: Diagnosis present

## 2017-08-02 DIAGNOSIS — I959 Hypotension, unspecified: Secondary | ICD-10-CM | POA: Diagnosis present

## 2017-08-02 DIAGNOSIS — Z7901 Long term (current) use of anticoagulants: Secondary | ICD-10-CM | POA: Diagnosis not present

## 2017-08-02 DIAGNOSIS — I130 Hypertensive heart and chronic kidney disease with heart failure and stage 1 through stage 4 chronic kidney disease, or unspecified chronic kidney disease: Secondary | ICD-10-CM | POA: Diagnosis present

## 2017-08-02 DIAGNOSIS — E118 Type 2 diabetes mellitus with unspecified complications: Secondary | ICD-10-CM | POA: Diagnosis not present

## 2017-08-02 DIAGNOSIS — E785 Hyperlipidemia, unspecified: Secondary | ICD-10-CM | POA: Diagnosis present

## 2017-08-02 DIAGNOSIS — N183 Chronic kidney disease, stage 3 (moderate): Secondary | ICD-10-CM | POA: Diagnosis present

## 2017-08-02 DIAGNOSIS — J441 Chronic obstructive pulmonary disease with (acute) exacerbation: Secondary | ICD-10-CM | POA: Diagnosis not present

## 2017-08-02 DIAGNOSIS — I2510 Atherosclerotic heart disease of native coronary artery without angina pectoris: Secondary | ICD-10-CM | POA: Diagnosis present

## 2017-08-02 LAB — CBC WITH DIFFERENTIAL/PLATELET
Basophils Absolute: 0 10*3/uL (ref 0.0–0.1)
Basophils Relative: 0 %
Eosinophils Absolute: 0 10*3/uL (ref 0.0–0.7)
Eosinophils Relative: 0 %
HEMATOCRIT: 28.2 % — AB (ref 39.0–52.0)
Hemoglobin: 8.9 g/dL — ABNORMAL LOW (ref 13.0–17.0)
LYMPHS ABS: 0.4 10*3/uL — AB (ref 0.7–4.0)
Lymphocytes Relative: 23 %
MCH: 35 pg — ABNORMAL HIGH (ref 26.0–34.0)
MCHC: 31.6 g/dL (ref 30.0–36.0)
MCV: 111 fL — ABNORMAL HIGH (ref 78.0–100.0)
MONOS PCT: 1 %
Monocytes Absolute: 0 10*3/uL — ABNORMAL LOW (ref 0.1–1.0)
NEUTROS ABS: 1.2 10*3/uL — AB (ref 1.7–7.7)
Neutrophils Relative %: 76 %
Platelets: 58 10*3/uL — ABNORMAL LOW (ref 150–400)
RBC: 2.54 MIL/uL — AB (ref 4.22–5.81)
RDW: 15.4 % (ref 11.5–15.5)
WBC: 1.6 10*3/uL — ABNORMAL LOW (ref 4.0–10.5)

## 2017-08-02 LAB — PROCALCITONIN: Procalcitonin: 0.15 ng/mL

## 2017-08-02 LAB — PROTIME-INR
INR: 3.14
Prothrombin Time: 32.1 seconds — ABNORMAL HIGH (ref 11.4–15.2)

## 2017-08-02 MED ORDER — FUROSEMIDE 10 MG/ML IJ SOLN
20.0000 mg | Freq: Once | INTRAMUSCULAR | Status: AC
  Administered 2017-08-02: 20 mg via INTRAVENOUS
  Filled 2017-08-02: qty 2

## 2017-08-02 MED ORDER — METHYLPREDNISOLONE SODIUM SUCC 125 MG IJ SOLR
60.0000 mg | Freq: Two times a day (BID) | INTRAMUSCULAR | Status: AC
  Administered 2017-08-02 – 2017-08-03 (×2): 60 mg via INTRAVENOUS
  Filled 2017-08-02 (×2): qty 2

## 2017-08-02 MED ORDER — DM-GUAIFENESIN ER 30-600 MG PO TB12
1.0000 | ORAL_TABLET | Freq: Two times a day (BID) | ORAL | Status: DC
  Administered 2017-08-02 – 2017-08-03 (×3): 1 via ORAL
  Filled 2017-08-02 (×3): qty 1

## 2017-08-02 MED ORDER — WARFARIN SODIUM 4 MG PO TABS
4.0000 mg | ORAL_TABLET | Freq: Once | ORAL | Status: AC
  Administered 2017-08-02: 4 mg via ORAL
  Filled 2017-08-02: qty 1

## 2017-08-02 MED ORDER — PROAIR HFA 108 (90 BASE) MCG/ACT IN AERS: 2.0000 | INHALATION_SPRAY | Freq: Four times a day (QID) | RESPIRATORY_TRACT | 1 refills | Status: DC | PRN

## 2017-08-02 NOTE — Progress Notes (Signed)
ANTICOAGULATION CONSULT NOTE - follow-up  Pharmacy Consult for Warfarin Indication: atrial fibrillation, St Jude AVR  Allergies  Allergen Reactions  . Rifampin Rash    May have been caused by Vancomycin or Rifampin (??)  . Vancomycin Rash    May have been caused by Vancomycin or Rifampin (??)    Patient Measurements: Height: 6\' 1"  (185.4 cm) Weight: 250 lb 8 oz (113.6 kg) IBW/kg (Calculated) : 79.9   Vital Signs: Temp: 97.5 F (36.4 C) (05/02 0551) Temp Source: Oral (05/02 0551) BP: 92/52 (05/02 0551) Pulse Rate: 64 (05/02 0551)  Labs: Recent Labs    08/01/17 1136 08/01/17 1804 08/01/17 2238 08/02/17 0421  HGB 9.3* 9.3*  --  8.9*  HCT 29.2* 29.1*  --  28.2*  PLT 56* 59*  --  58*  LABPROT  --   --  30.5* 32.1*  INR  --   --  2.95 3.14  CREATININE 1.58* 1.48*  --   --   TROPONINI  --  <0.03  --   --     Estimated Creatinine Clearance: 52.6 mL/min (A) (by C-G formula based on SCr of 1.48 mg/dL (H)).  Infusions:    Assessment: 4 yoM with increase SOB, chest congestion and cough on chronic warfarin for a-fib. Home dose is warfarin 2.5 mg Su/W/F and 5 mg M/Tu/Th/Sa. INR=2.95 on admission.   Today, 08/02/2017  Warfarin 2.5mg  PO given late last night due to admission  INR = 3.14 (therapeutic for h/o mechanical AVR)  CBC: Hgb decreased but looks stable, thrombocytopenia (stable)  Potential drug-drug interactions with warfarin: corticosteroids  Patient reports decrease PO intake prior to admission.  Appetite Improved this am  Goal of Therapy:  INR 2.5 - 3.5   Plan:   Warfarin 4mg  PO x 1 tonight - small dose adjust for tonight due to decrease PO intake at home and possible interaction with corticosteroid  Anticipate home with prior to admission warfarin regimen  Daily INR - monitor trend  Monitor CBC   Doreene Eland, PharmD, BCPS.   Pager: [phone] 08/02/2017 8:10 AM

## 2017-08-02 NOTE — Progress Notes (Addendum)
PROGRESS NOTE    [name]  VXB:[id number] DOB: 17-Mar-1936 DOA: 08/01/2017 PCP: Cassandria Anger, MD Outpatient Specialists:  Brief Narrative:  82 year old male with history significant for CAD, Hx of CABG, hx of left carotid endarterectomy, left subclavian bypass stenosis, systolic CHF, chronic Afib, Hx of rheumatic fever s/p mechanical aortic valve on warfarin, OSA with CPAP, DM II, Vitamin B12 deficiency, and pancytopenia. 30 pack-year history, quit smoking 25 years ago. Recent ED visit for recurrent epistaxis, s/p nasal cauterization. Patient reports a few weeks ago he felt congested with productive cough. Since then symptoms have worsened. Patient was attending visit at cancer center for monitoring of pancytopenia when he was found to be dyspneic, with persistent productive cough, and hypotensive and was sent to ED. Patient denies chest pain, palpitations, fever, chills, abdominal pain, nausea, vomiting, or diarrhea.  In ED he received O2, Azithromycin, lasix 20 mg IV once, bronchodilators, and Solumedrol. Patient was admitted on working diagnosis of COPD exacerbation.   Assessment & Plan:   COPD Exacerbation - Azithromycin discontinued yesterday - Discontinued oxygen during the day, remains in 90's on room air - continue DuoNeb q6hrs, albuterol q 4hrs prn - Mucinex changed to Mucinex DM - continue Solumedrol, will plan for prednisone taper at discharge - incentive spirometry RT ordered  Left Lower Lobe Pulmonary Nodule - 5 mm nodule found on chest CT - based on age, smoking history, family history of lung cancer - recommend follow up CT in 1 year  Systolic CHF Exacerbation - Echo 02/17/17 EF 55-60%, mild LVH - BNP 784.4 at admission - continue home torsemide - IV Lasix 20 mg once ordered  Hyperlipidemia - continue Vytorin  Hypertension - continue carvedilol, torsemide - due to left subclavian bypass stenosis recommend BP checks in right arm only for  consistency  Atrial Fibrillation -CHA2DS2-VASc Score of 6 - rate well controlled on carvedilol - continue Warfarin  Type II Diabetes Mellitus  - continue metformin XR - continue CBG monitoring with ISS until discharge  Pancytopenia - ongoing, follow up with hematology/oncology  CKD Stage III - Cr 1.48 today, trending down from yesterday, around baseline (Cr has ranged from 1.20-2.10 since Jan 2019)  DVT prophylaxis: Warfarin Code Status: Full Family Communication: None at bedside Disposition Plan: Home when clinically stable  Consultants:   None  Procedures:   None  Antimicrobials:   Azithromycin 5/1 >> 5/1   Subjective: Patient states that breathing has significantly improved but chest continues to feel tight and he  continues to have unremitting cough which is not as productive as before.   Objective: Vitals:   08/01/17 2321 08/02/17 0551 08/02/17 0809 08/02/17 0821  BP: 116/81 (!) 92/52 102/74   Pulse: 86 64 64 88  Resp: 20 18  18   Temp: 97.7 F (36.5 C) (!) 97.5 F (36.4 C)    TempSrc: Oral Oral    SpO2: 96% 97% 93% 90%  Weight:      Height:        Intake/Output Summary (Last 24 hours) at 08/02/2017 1211 Last data filed at 08/02/2017 0900 Gross per 24 hour  Intake 840 ml  Output -  Net 840 ml   Filed Weights   08/01/17 1414 08/01/17 2317  Weight: 114.8 kg (253 lb) 113.6 kg (250 lb 8 oz)    Examination:  General exam: Appears calm and comfortable, in no acute distress . Respiratory system: Normal respiratory effort. Bilateral rhonchi and wheezing.  Cardiovascular system: S1 & S2 heard, irregularly irregular  rhythm. Mechanical aortic valve click heard. 1+ pitting edema in bilateral lower extremities. No JVD. Gastrointestinal system: Abdomen is nondistended, soft and nontender.  Central nervous system: Alert and oriented x 3. No focal neurological deficits. Extremities: Symmetric 5 x 5 power. Skin: Hyperpigmentation on bilateral lower  legs. Psychiatry: Mood & affect appropriate.     Data Reviewed: I have personally reviewed following labs and imaging studies  CBC: Recent Labs  Lab 08/01/17 1136 08/01/17 1804 08/02/17 0421  WBC 2.6* 2.5* 1.6*  NEUTROABS 1.7 1.4* 1.2*  HGB 9.3* 9.3* 8.9*  HCT 29.2* 29.1* 28.2*  MCV 109.0* 109.8* 111.0*  PLT 56* 59* 58*   Basic Metabolic Panel: Recent Labs  Lab 07/27/17 1159 08/01/17 1136 08/01/17 1804 08/01/17 2238  NA 140 136 138  --   K 4.8 4.2 4.1  --   CL 102 105 104  --   CO2 21 22 22   --   GLUCOSE 91 111 105*  --   BUN 44* 34* 33*  --   CREATININE 1.55* 1.58* 1.48*  --   CALCIUM 9.5 9.1 9.0  --   MG  --   --   --  2.1  PHOS  --   --   --  3.3   GFR: Estimated Creatinine Clearance: 52.6 mL/min (A) (by C-G formula based on SCr of 1.48 mg/dL (H)). Liver Function Tests: Recent Labs  Lab 08/01/17 1136 08/01/17 1804  AST 25 28  ALT 13 16*  ALKPHOS 208* 183*  BILITOT 1.1 1.6*  PROT 7.1 7.5  ALBUMIN 3.6 3.5   No results for input(s): LIPASE, AMYLASE in the last 168 hours. No results for input(s): AMMONIA in the last 168 hours. Coagulation Profile: Recent Labs  Lab 08/01/17 2238 08/02/17 0421  INR 2.95 3.14   Cardiac Enzymes: Recent Labs  Lab 08/01/17 1804  TROPONINI <0.03   BNP (last 3 results) Recent Labs    03/30/17 1147 05/25/17 1044 07/10/17 1523  PROBNP 1,858* 2,749* 411.0*   HbA1C: No results for input(s): HGBA1C in the last 72 hours. CBG: No results for input(s): GLUCAP in the last 168 hours. Lipid Profile: No results for input(s): CHOL, HDL, LDLCALC, TRIG, CHOLHDL, LDLDIRECT in the last 72 hours. Thyroid Function Tests: No results for input(s): TSH, T4TOTAL, FREET4, T3FREE, THYROIDAB in the last 72 hours. Anemia Panel: Recent Labs    08/01/17 1136 08/01/17 1137  VITAMINB12 1,787*  --   FERRITIN  --  114  RETICCTPCT 3.4*  --    Urine analysis:    Component Value Date/Time   COLORURINE YELLOW 02/09/2014 0848    APPEARANCEUR CLEAR 02/09/2014 0848   LABSPEC 1.025 02/09/2014 0848   PHURINE 5.5 02/09/2014 0848   GLUCOSEU NEGATIVE 02/09/2014 0848   HGBUR SMALL (A) 02/09/2014 0848   BILIRUBINUR Neg 08/14/2014 1051   KETONESUR NEGATIVE 02/09/2014 0848   PROTEINUR 30+ 08/14/2014 1051   PROTEINUR 30 (A) 02/04/2009 1045   UROBILINOGEN 0.2 08/14/2014 1051   UROBILINOGEN 0.2 02/09/2014 0848   NITRITE Neg 08/14/2014 1051   NITRITE NEGATIVE 02/09/2014 0848   LEUKOCYTESUR Negative 08/14/2014 1051   Sepsis Labs: @LABRCNTIP (procalcitonin:4,lacticidven:4)  )No results found for this or any previous visit (from the past 240 hour(s)).       Radiology Studies: Dg Chest 2 View  Result Date: 08/01/2017 CLINICAL DATA:  82 year old male with shortness of breath cough congestion and wheezing. EXAM: CHEST - 2 VIEW COMPARISON:  Chest radiographs 07/10/2017 and earlier. FINDINGS: Semi upright AP and lateral views  of the chest. Moderate to severe cardiomegaly is stable. Prior CABG. Calcified aortic atherosclerosis. Other mediastinal contours are within normal limits. Visualized tracheal air column is within normal limits. Stable lung volumes. No pneumothorax, pulmonary edema, pleural effusion or confluent pulmonary opacity. No acute osseous abnormality identified. Negative visible bowel gas pattern. IMPRESSION: Stable cardiomegaly. No acute cardiopulmonary abnormality. Electronically Signed   By: Genevie Ann M.D.   On: 08/01/2017 15:42   Ct Chest Wo Contrast  Result Date: 08/01/2017 CLINICAL DATA:  Increase shortness of breath. Hypotension. Chest congestion and cough. EXAM: CT CHEST WITHOUT CONTRAST TECHNIQUE: Multidetector CT imaging of the chest was performed following the standard protocol without IV contrast. COMPARISON:  12/31/2008 FINDINGS: Cardiovascular: The heart is markedly enlarged. Coronary artery calcification is evident. Atherosclerotic calcification is noted in the wall of the thoracic aorta. Mediastinum/Nodes:  Scattered borderline enlarged mediastinal lymph nodes are similar to prior. No evidence for gross hilar lymphadenopathy although assessment is limited by the lack of intravenous contrast on today's study. The esophagus has normal imaging features. There is no axillary lymphadenopathy. Lungs/Pleura: Dependent a mucus/debris is seen in the trachea and left mainstem bronchus. Centrilobular emphysema noted bilaterally. Bronchial wall thickening is noted bilaterally with minimal small airway impaction in the right lower lobe. 5 mm left lower lobe pulmonary nodule is new in the interval. 3 mm calcified granuloma in the left lower lobe (78/5) is stable no pulmonary edema or pleural effusion. Upper Abdomen: Small volume ascites is noted in the upper abdomen. Musculoskeletal: Bone windows reveal no worrisome lytic or sclerotic osseous lesions. IMPRESSION: 1. No acute findings in the chest. 2. There is some minimal dependent mucus/fluid/debris in the trachea and left mainstem bronchus. 3. 5 mm left lower lobe pulmonary nodule, new since prior study. No follow-up needed if patient is low-risk. Non-contrast chest CT can be considered in 12 months if patient is high-risk. This recommendation follows the consensus statement: Guidelines for Management of Incidental Pulmonary Nodules Detected on CT Images: From the Fleischner Society 2017; Radiology 2017; 284:228-243. 4. Small volume ascites noted in the upper abdomen. Electronically Signed   By: Misty Stanley M.D.   On: 08/01/2017 18:23        Scheduled Meds: . carvedilol  25 mg Oral BID WC  . cholecalciferol  2,000 Units Oral Daily  . dextromethorphan-guaiFENesin  1 tablet Oral BID  . ezetimibe-simvastatin  1 tablet Oral QHS  . folic acid  1 mg Oral Daily  . ipratropium-albuterol  3 mL Nebulization TID  . metFORMIN  1,500 mg Oral Daily  . methylPREDNISolone (SOLU-MEDROL) injection  60 mg Intravenous Q12H  . torsemide  20 mg Oral Daily  . vitamin B-12  3,000 mcg  Oral Daily  . warfarin  4 mg Oral ONCE-1800  . Warfarin - Pharmacist Dosing Inpatient   Does not apply q1800   Continuous Infusions:   LOS: 0 days    Time spent: 25 min  Merlene Pulling, PA-S [facility] If 7PM-7AM, please contact night-coverage [email or web address] Password [email or web address] 08/02/2017, 12:11 PM

## 2017-08-02 NOTE — Care Management Obs Status (Signed)
[place] NOTIFICATION   Patient Details  Name: [name] MRN: [id number] Date of Birth: 29-Dec-1935   Medicare Observation Status Notification Given:  Yes    MahabirJuliann Pulse, RN 08/02/2017, 1:00 PM

## 2017-08-02 NOTE — Progress Notes (Signed)
Inpatient Diabetes Program Recommendations  AACE/ADA: New Consensus Statement on Inpatient Glycemic Control (2015)  Target Ranges:  Prepandial:   less than 140 mg/dL      Peak postprandial:   less than 180 mg/dL (1-2 hours)      Critically ill patients:  140 - 180 mg/dL   Results for [name], [name] (MRN [id number]) as of 08/02/2017 09:55  Ref. Range 08/01/2017 11:36 08/01/2017 18:04  Glucose Latest Ref Range: 65 - 99 mg/dL 111 105 (H)    Admit with: SOB  History: DM, COPD,   Home DM Meds: Metformin 1500 mg daily  Current Insulin Orders: Metformin 1500 mg daily     Note patient receiving Solumedrol 60 mg BID.  History of Type 2 DM.  Metformin ordered.      MD- Please consider placing orders for Novolog Sensitive Correction Scale/ SSI (0-9 units) TID AC + HS     --Will follow patient during hospitalization--  Wyn Quaker RN, MSN, CDE Diabetes Coordinator Inpatient Glycemic Control Team Team Pager: [phone] (8a-5p)

## 2017-08-02 NOTE — Telephone Encounter (Signed)
Reviewed chart pt is up-to-date sent refills to [facility].../lmb  

## 2017-08-02 NOTE — Progress Notes (Signed)
Initial Nutrition Assessment  DOCUMENTATION CODES:   Obesity unspecified  INTERVENTION:    RD to encourage PO intake  NUTRITION DIAGNOSIS:   Inadequate oral intake related to decreased appetite as evidenced by per patient/family report.  GOAL:   Patient will meet greater than or equal to 90% of their needs  MONITOR:   PO intake, Weight trends, Labs, I & O's  REASON FOR ASSESSMENT:   Consult COPD Protocol  ASSESSMENT:   Patient with PMH significant for aortic stenosis, left carotid endarterectomy, COPD, CHF, chronic atrial fibrillation, CAD,  s/p CABG, rheumatic fever, subclavian bypasses stenosis, and obstructive sleep apnea. Presents this admission with COPD exacerbation.    Spoke with pt at bedside. Reports his appetite began decreasing one month ago due to feelings of fullness. Wife noticed pt would finish 25-50% of each meal instead of completing all of them per his usual. RD observed pt eating a tuna salad at bedside. States that his appetite has improved since being in the hospital, likely related to resolution of breathing issues. Pt does not wish to use supplementation. Discussed the importance of protein intake for preservation of lean body mass given his recent wt loss.   Pt endorses a UBW of 260 lb. Records indicate pt weighed 263 lb 05/25/17 and 250 lb this admission. Unable to determine how much of this loss is fluid versus dry wt given history of CHF. Nutrition-Focused physical exam completed.   Medications reviewed and include: Vit D, folic acid, metformin, solu medrol, torsemide 20 mg once daily, Vit B12 Labs reviewed.   NUTRITION - FOCUSED PHYSICAL EXAM:    Most Recent Value  Orbital Region  No depletion  Upper Arm Region  No depletion  Thoracic and Lumbar Region  Unable to assess  Buccal Region  No depletion  Temple Region  Mild depletion  Clavicle Bone Region  Moderate depletion  Clavicle and Acromion Bone Region  Mild depletion  Scapular Bone Region   Unable to assess  Dorsal Hand  No depletion  Patellar Region  No depletion  Anterior Thigh Region  No depletion  Posterior Calf Region  No depletion  Edema (RD Assessment)  Moderate  Hair  Reviewed  Eyes  Reviewed  Mouth  Reviewed  Skin  Reviewed  Nails  Reviewed     Diet Order:   Diet Order           Diet heart healthy/carb modified Room service appropriate? Yes; Fluid consistency: Thin  Diet effective now          EDUCATION NEEDS:   Education needs have been addressed  Skin:  Skin Assessment: Reviewed RN Assessment  Last BM:  PTA  Height:   Ht Readings from Last 1 Encounters:  08/01/17 6\' 1"  (1.854 m)    Weight:   Wt Readings from Last 1 Encounters:  08/01/17 250 lb 8 oz (113.6 kg)    Ideal Body Weight:  83.6 kg  BMI:  Body mass index is 33.05 kg/m.  Estimated Nutritional Needs:   Kcal:  2100-2300 kcal  Protein:  105-115 g  Fluid:  >2.1 L/day    Mariana Single RD, LDN Clinical Nutrition Pager # - [phone]

## 2017-08-02 NOTE — Telephone Encounter (Signed)
Spoke with pt's wife and she states [name] is hospitalized at [facility] to call and let us know when he is discharged and she states understanding

## 2017-08-03 LAB — CBC WITH DIFFERENTIAL/PLATELET
BASOS ABS: 0 10*3/uL (ref 0.0–0.1)
BASOS PCT: 0 %
EOS ABS: 0 10*3/uL (ref 0.0–0.7)
EOS PCT: 0 %
HCT: 27.3 % — ABNORMAL LOW (ref 39.0–52.0)
Hemoglobin: 8.6 g/dL — ABNORMAL LOW (ref 13.0–17.0)
Lymphocytes Relative: 10 %
Lymphs Abs: 0.4 10*3/uL — ABNORMAL LOW (ref 0.7–4.0)
MCH: 34.4 pg — ABNORMAL HIGH (ref 26.0–34.0)
MCHC: 31.5 g/dL (ref 30.0–36.0)
MCV: 109.2 fL — ABNORMAL HIGH (ref 78.0–100.0)
Monocytes Absolute: 0.2 10*3/uL (ref 0.1–1.0)
Monocytes Relative: 5 %
Neutro Abs: 3.2 10*3/uL (ref 1.7–7.7)
Neutrophils Relative %: 85 %
PLATELETS: 65 10*3/uL — AB (ref 150–400)
RBC: 2.5 MIL/uL — ABNORMAL LOW (ref 4.22–5.81)
RDW: 15.3 % (ref 11.5–15.5)
WBC: 3.8 10*3/uL — AB (ref 4.0–10.5)

## 2017-08-03 LAB — BASIC METABOLIC PANEL
Anion gap: 8 (ref 5–15)
BUN: 43 mg/dL — AB (ref 6–20)
CALCIUM: 8.6 mg/dL — AB (ref 8.9–10.3)
CO2: 23 mmol/L (ref 22–32)
Chloride: 103 mmol/L (ref 101–111)
Creatinine, Ser: 1.47 mg/dL — ABNORMAL HIGH (ref 0.61–1.24)
GFR calc Af Amer: 50 mL/min — ABNORMAL LOW (ref >60)
GFR, EST NON AFRICAN AMERICAN: 43 mL/min — AB (ref >60)
GLUCOSE: 156 mg/dL — AB (ref 65–99)
Potassium: 3.9 mmol/L (ref 3.5–5.1)
SODIUM: 134 mmol/L — AB (ref 135–145)

## 2017-08-03 LAB — PROTIME-INR
INR: 3.47
PROTHROMBIN TIME: 34.6 s — AB (ref 11.4–15.2)

## 2017-08-03 LAB — MAGNESIUM: MAGNESIUM: 1.9 mg/dL (ref 1.7–2.4)

## 2017-08-03 MED ORDER — WARFARIN SODIUM 2.5 MG PO TABS: 2.5000 mg | ORAL_TABLET | Freq: Once | ORAL | Status: DC

## 2017-08-03 MED ORDER — PREDNISONE 10 MG PO TABS: ORAL_TABLET | ORAL | 0 refills | Status: DC

## 2017-08-03 MED ORDER — DM-GUAIFENESIN ER 30-600 MG PO TB12: 1.0000 | ORAL_TABLET | Freq: Two times a day (BID) | ORAL | 0 refills | Status: DC

## 2017-08-03 MED ORDER — BUDESONIDE-FORMOTEROL FUMARATE 160-4.5 MCG/ACT IN AERO: 2.0000 | INHALATION_SPRAY | Freq: Two times a day (BID) | RESPIRATORY_TRACT | 0 refills | Status: DC

## 2017-08-03 MED ORDER — PROAIR HFA 108 (90 BASE) MCG/ACT IN AERS: 2.0000 | INHALATION_SPRAY | RESPIRATORY_TRACT | 1 refills | Status: DC | PRN

## 2017-08-03 NOTE — Progress Notes (Signed)
ANTICOAGULATION CONSULT NOTE - follow-up  Pharmacy Consult for Warfarin Indication: atrial fibrillation, St Jude AVR  Allergies  Allergen Reactions  . Rifampin Rash    May have been caused by Vancomycin or Rifampin (??)  . Vancomycin Rash    May have been caused by Vancomycin or Rifampin (??)    Patient Measurements: Height: 6\' 1"  (185.4 cm) Weight: 250 lb 8 oz (113.6 kg) IBW/kg (Calculated) : 79.9   Vital Signs: Temp: 98.1 F (36.7 C) (05/03 0357) Temp Source: Oral (05/03 0357) BP: 107/60 (05/03 0357) Pulse Rate: 66 (05/03 0357)  Labs: Recent Labs    08/01/17 1136  08/01/17 1804 08/01/17 2238 08/02/17 0421 08/03/17 0407  HGB 9.3*   < > 9.3*  --  8.9* 8.6*  HCT 29.2*  --  29.1*  --  28.2* 27.3*  PLT 56*  --  59*  --  58* 65*  LABPROT  --   --   --  30.5* 32.1* 34.6*  INR  --   --   --  2.95 3.14 3.47  CREATININE 1.58*  --  1.48*  --   --  1.47*  TROPONINI  --   --  <0.03  --   --   --    < > = values in this interval not displayed.    Estimated Creatinine Clearance: 52.9 mL/min (A) (by C-G formula based on SCr of 1.47 mg/dL (H)).  Infusions:    Assessment: 64 yoM with increase SOB, chest congestion and cough on chronic warfarin for a-fib. Home dose is warfarin 2.5 mg Su/W/F and 5 mg M/Tu/Th/Sa. INR=2.95 on admission.   Today, 08/03/2017  Warfarin 2.5mg  PO given late last night due to admission  INR = 3.47 (therapeutic for h/o mechanical AVR) - INR trending up   CBC: Hgb decreased but looks stable, thrombocytopenia (stable)  Potential drug-drug interactions with warfarin: corticosteroids  Patient reports decrease PO intake prior to admission.  Appetite Improved this am  Goal of Therapy:  INR 2.5 - 3.5   Plan:   Warfarin 2.5mg  PO x 1 tonight - small dose adjust for tonight due to decrease PO intake at home and possible interaction with corticosteroid  Anticipate home with prior to admission warfarin regimen. Consider taking 2.5mg  on Saturday (in  place of 5mg ) and resume home regimen  Daily INR - monitor trend  Monitor CBC   Doreene Eland, PharmD, BCPS.   Pager: [phone] 08/03/2017 7:27 AM

## 2017-08-03 NOTE — Progress Notes (Signed)
SATURATION QUALIFICATIONS: (This note is used to comply with regulatory documentation for home oxygen)  Patient Saturations on Room Air at Rest =90%  Patient Saturations on Room Air while Ambulating =85%  Patient Saturations on 1 Liters of oxygen while Ambulating = 92%  Please briefly explain why patient needs home oxygen :Decreased oxygen saturations on room air with increase in respiratory  Pattern. Dyspnea with exertion on room air

## 2017-08-03 NOTE — Discharge Summary (Signed)
Physician Discharge Summary  [name]  QPY:[id number]  DOB: 09-17-1935  DOA: 08/01/2017 PCP: Cassandria Anger, MD  Admit date: 08/01/2017 Discharge date: 08/03/2017  Admitted From: Home Disposition: Home  Recommendations for Outpatient Follow-up:  1. Follow up with PCP in 1 week 2. Please obtain CMP/CBC in one week to monitor renal function and hemoglobin 3. Follow-up chest x-ray in 4 to 6-week 4. Monitor INR in 1 week 5. Complete prednisone taper 6. Need repeat CT scan of the chest to monitor 5 mm nodule  Equipment/Devices: O2 1 L nasal cannula  Discharge Condition: Stable CODE STATUS: Full code Diet recommendation: Heart Healthy  Brief/Interim Summary: For full details see H&P/Progress note, but in brief, [name] is a 82 year old male with medical history significant for CAD, CABG, left carotid endarterectomy with left subclavian bypass, systolic CHF, chronic A. fib, rheumatic fever status post mechanical valve on warfarin, OSA, diabetes mellitus type 2 and B12 deficiency.  Patient presented to the emergency department complaining of shortness of breath and productive cough.  Patient was at the cancer center for monitoring of pancytopenia and was found to be dyspneic with persistent cough and hypotensive therefore sent to the ED.  Upon ED evaluation was found to be hypoxic, chest x-ray with stable cardiomegaly but no acute abnormalities.  BNP elevated. Patient was admitted with working diagnosis of COPD exacerbation.  Subjective: Patient seen and examined, feeling much better.  Breathing has significantly improved.  No acute events overnight.  Unable to wean off oxygen especially during ambulation.  Cough has improved.  Discharge Diagnoses/Hospital Course:  Acute on chronic respiratory failure with hypoxia Multifactorial secondary to COPD, CHF possible contributing.  Procalcitonin 0.15 no antibiotics indicated Pulse ox with ambulation check and patient desat, will  discharge patient on home oxygen with ambulation Initially treated with high-dose IV Solu-Medrol, will discharge on prednisone taper. Continue supportive treatment with albuterol, Mucinex and will add Symbicort twice daily Patient responded well to diuresis, continue Demadex 30 mg daily Follow-up with PCP  Left lower lobe pulmonary nodule 5 mm nodule found on chest CT Follow-up with CT in 1 year is recommended  Acute on chronic systolic CHF exacerbation Elevated BNP at 784 Patient received 2 doses of IV Lasix with good diuresis Continue Coreg, Enalapril has been on hold by card PTA  Continue torsemide  A. fib/aortic mechanical valve Rate well controlled on beta-blocker, anticoagulation with warfarin.  CKD stage III Cr at baseline  Continue to monitor renal function while receiving IV Lasix Avoid hypotension and nephrotoxic agents.  All other chronic medical condition were stable during the hospitalization.  On the day of the discharge the patient's vitals were stable, and no other acute medical condition were reported by patient. the patient was felt safe to be discharge to home.   Discharge Instructions  You were cared for by a hospitalist during your hospital stay. If you have any questions about your discharge medications or the care you received while you were in the hospital after you are discharged, you can call the unit and asked to speak with the hospitalist on call if the hospitalist that took care of you is not available. Once you are discharged, your primary care physician will handle any further medical issues. Please note that NO REFILLS for any discharge medications will be authorized once you are discharged, as it is imperative that you return to your primary care physician (or establish a relationship with a primary care physician if you do not have one) for  your aftercare needs so that they can reassess your need for medications and monitor your lab  values.  Discharge Instructions    Call MD for:  difficulty breathing, headache or visual disturbances   Complete by:  As directed    Call MD for:  extreme fatigue   Complete by:  As directed    Call MD for:  hives   Complete by:  As directed    Call MD for:  persistant dizziness or light-headedness   Complete by:  As directed    Call MD for:  persistant nausea and vomiting   Complete by:  As directed    Call MD for:  redness, tenderness, or signs of infection (pain, swelling, redness, odor or green/yellow discharge around incision site)   Complete by:  As directed    Call MD for:  severe uncontrolled pain   Complete by:  As directed    Call MD for:  temperature >100.4   Complete by:  As directed    Diet - low sodium heart healthy   Complete by:  As directed    Increase activity slowly   Complete by:  As directed      Allergies as of 08/03/2017      Reactions   Rifampin Rash   May have been caused by Vancomycin or Rifampin (??)   Vancomycin Rash   May have been caused by Vancomycin or Rifampin (??)      Medication List    STOP taking these medications   ampicillin 500 MG capsule Commonly known as:  PRINCIPEN   enalapril 20 MG tablet Commonly known as:  VASOTEC   guaiFENesin 600 MG 12 hr tablet Commonly known as:  MUCINEX     TAKE these medications   acetaminophen 500 MG tablet Commonly known as:  TYLENOL Take 500-1,000 mg by mouth daily as needed (for pain).   budesonide-formoterol 160-4.5 MCG/ACT inhaler Commonly known as:  SYMBICORT Inhale 2 puffs into the lungs 2 (two) times daily.   carvedilol 25 MG tablet Commonly known as:  COREG Take 1 tablet (25 mg total) by mouth 2 (two) times daily with a meal.   dextromethorphan-guaiFENesin 30-600 MG 12hr tablet Commonly known as:  MUCINEX DM Take 1 tablet by mouth 2 (two) times daily.   ezetimibe-simvastatin 10-20 MG tablet Commonly known as:  VYTORIN Take 1 tablet by mouth at bedtime.   fluocinonide cream  0.05 % Commonly known as:  LIDEX Apply 1 application topically 2 (two) times daily. What changed:    when to take this  reasons to take this   folic acid 1 MG tablet Commonly known as:  FOLVITE Take 1 tablet (1 mg total) by mouth daily.   glucose blood test strip Commonly known as:  ONE TOUCH ULTRA TEST USE AS DIRECTED TO TEST BLOOD GLUCOSE  EVERY OTHER DAY DX: 250.00   glucose blood test strip Commonly known as:  ONETOUCH VERIO Use as instructed to check blood sugar once daily. Dx Code E11.8   metFORMIN 750 MG 24 hr tablet Commonly known as:  GLUCOPHAGE-XR TAKE 2 TABLETS (1,500 MG TOTAL) BY MOUTH DAILY.   NON FORMULARY BiPAP: At bedtime   NON FORMULARY ONE TOUCH ULTRA TEST STRIPS  AND LANCETS   ONETOUCH DELICA LANCETS FINE Misc Use to obtain a blood specimen every day Dx code E11.8   predniSONE 10 MG tablet Commonly known as:  DELTASONE Take 4 tablets for 3 days; Take 3 tablets for 4 days; Take 2 tablets  for 3 days; Take 1 tablet for 4 days   PROAIR HFA 108 (90 Base) MCG/ACT inhaler Generic drug:  albuterol Inhale 2 puffs into the lungs every 4 (four) hours as needed (for chest congestion). What changed:  when to take this   torsemide 20 MG tablet Commonly known as:  DEMADEX Take 1 tablet (20 mg total) by mouth daily.   VASCULERA Tabs Take 1 tablet by mouth daily.   Vitamin B-12 3000 MCG Subl Take 3,000 mcg by mouth daily.   Vitamin D3 2000 units capsule Take 1 capsule (2,000 Units total) by mouth daily.   warfarin 5 MG tablet Commonly known as:  COUMADIN Take as directed. If you are unsure how to take this medication, talk to your nurse or doctor. Original instructions:  TAKE AS DIRECTED BY COUMADIN CLINIC What changed:    how much to take  how to take this  when to take this  additional instructions            Durable Medical Equipment  (From admission, onward)        Start     Ordered   08/03/17 1141  DME Oxygen  Once    Question  Answer Comment  Mode or (Route) Nasal cannula   Liters per Minute 1   Frequency Continuous (stationary and portable oxygen unit needed)   Oxygen delivery system Gas      08/03/17 1142     Follow-up Information    [name], [name], MD. Schedule an appointment as soon as possible for a visit in 1 week(s).   Specialty:  Internal Medicine Why:  Hospital follow-up Contact information: [place] [address] [phone]        Belva Crome, MD .   Specialty:  Cardiology Contact information: [phone]  [place] [address] [phone]          Allergies  Allergen Reactions  . Rifampin Rash    May have been caused by Vancomycin or Rifampin (??)  . Vancomycin Rash    May have been caused by Vancomycin or Rifampin (??)    Consultations:  None    Procedures/Studies: Dg Chest 2 View  Result Date: 08/01/2017 CLINICAL DATA:  82 year old male with shortness of breath cough congestion and wheezing. EXAM: CHEST - 2 VIEW COMPARISON:  Chest radiographs 07/10/2017 and earlier. FINDINGS: Semi upright AP and lateral views of the chest. Moderate to severe cardiomegaly is stable. Prior CABG. Calcified aortic atherosclerosis. Other mediastinal contours are within normal limits. Visualized tracheal air column is within normal limits. Stable lung volumes. No pneumothorax, pulmonary edema, pleural effusion or confluent pulmonary opacity. No acute osseous abnormality identified. Negative visible bowel gas pattern. IMPRESSION: Stable cardiomegaly. No acute cardiopulmonary abnormality. Electronically Signed   By: Genevie Ann M.D.   On: 08/01/2017 15:42   Dg Chest 2 View  Result Date: 07/11/2017 CLINICAL DATA:  Chronic dyspnea.  Pedal edema.  Hypertension. EXAM: CHEST - 2 VIEW COMPARISON:  None. FINDINGS: Marked enlargement of cardiac silhouette. Previous CABG. No consolidation or edema. Mild vascular congestion at the lung bases. Small BILATERAL pleural  effusions. No osseous findings. Similar appearance to priors. IMPRESSION: Marked cardiac enlargement.  No frank edema or consolidation. Electronically Signed   By: Staci Righter M.D.   On: 07/11/2017 09:05   Ct Chest Wo Contrast  Result Date: 08/01/2017 CLINICAL DATA:  Increase shortness of breath. Hypotension. Chest congestion and cough. EXAM: CT CHEST WITHOUT CONTRAST TECHNIQUE: Multidetector  CT imaging of the chest was performed following the standard protocol without IV contrast. COMPARISON:  12/31/2008 FINDINGS: Cardiovascular: The heart is markedly enlarged. Coronary artery calcification is evident. Atherosclerotic calcification is noted in the wall of the thoracic aorta. Mediastinum/Nodes: Scattered borderline enlarged mediastinal lymph nodes are similar to prior. No evidence for gross hilar lymphadenopathy although assessment is limited by the lack of intravenous contrast on today's study. The esophagus has normal imaging features. There is no axillary lymphadenopathy. Lungs/Pleura: Dependent a mucus/debris is seen in the trachea and left mainstem bronchus. Centrilobular emphysema noted bilaterally. Bronchial wall thickening is noted bilaterally with minimal small airway impaction in the right lower lobe. 5 mm left lower lobe pulmonary nodule is new in the interval. 3 mm calcified granuloma in the left lower lobe (78/5) is stable no pulmonary edema or pleural effusion. Upper Abdomen: Small volume ascites is noted in the upper abdomen. Musculoskeletal: Bone windows reveal no worrisome lytic or sclerotic osseous lesions. IMPRESSION: 1. No acute findings in the chest. 2. There is some minimal dependent mucus/fluid/debris in the trachea and left mainstem bronchus. 3. 5 mm left lower lobe pulmonary nodule, new since prior study. No follow-up needed if patient is low-risk. Non-contrast chest CT can be considered in 12 months if patient is high-risk. This recommendation follows the consensus statement:  Guidelines for Management of Incidental Pulmonary Nodules Detected on CT Images: From the Fleischner Society 2017; Radiology 2017; 284:228-243. 4. Small volume ascites noted in the upper abdomen. Electronically Signed   By: Misty Stanley M.D.   On: 08/01/2017 18:23    Discharge Exam: Vitals:   08/03/17 0357 08/03/17 0943  BP: 107/60   Pulse: 66   Resp: 18   Temp: 98.1 F (36.7 C)   SpO2: 93% 94%   Vitals:   08/02/17 2054 08/02/17 2100 08/03/17 0357 08/03/17 0943  BP:  (!) 103/59 107/60   Pulse:  65 66   Resp:  20 18   Temp:  97.7 F (36.5 C) 98.1 F (36.7 C)   TempSrc:  Oral Oral   SpO2: 92% 96% 93% 94%  Weight:      Height:        General: Pt is alert, awake, not in acute distress Cardiovascular: RRR, S1/S2 +, no rubs, no gallops Respiratory: Good air entry, mild diffuse expiratory wheezing, mild rhonchi  Abdominal: Soft, NT, ND Extremities: no edema  The results of significant diagnostics from this hospitalization (including imaging, microbiology, ancillary and laboratory) are listed below for reference.     Microbiology: No results found for this or any previous visit (from the past 240 hour(s)).   Labs: BNP (last 3 results) Recent Labs    08/01/17 1804  BNP 485.4*   Basic Metabolic Panel: Recent Labs  Lab 07/27/17 1159 08/01/17 1136 08/01/17 1804 08/01/17 2238 08/03/17 0407  NA 140 136 138  --  134*  K 4.8 4.2 4.1  --  3.9  CL 102 105 104  --  103  CO2 21 22 22   --  23  GLUCOSE 91 111 105*  --  156*  BUN 44* 34* 33*  --  43*  CREATININE 1.55* 1.58* 1.48*  --  1.47*  CALCIUM 9.5 9.1 9.0  --  8.6*  MG  --   --   --  2.1 1.9  PHOS  --   --   --  3.3  --    Liver Function Tests: Recent Labs  Lab 08/01/17 1136 08/01/17 1804  AST  25 28  ALT 13 16*  ALKPHOS 208* 183*  BILITOT 1.1 1.6*  PROT 7.1 7.5  ALBUMIN 3.6 3.5   No results for input(s): LIPASE, AMYLASE in the last 168 hours. No results for input(s): AMMONIA in the last 168  hours. CBC: Recent Labs  Lab 08/01/17 1136 08/01/17 1804 08/02/17 0421 08/03/17 0407  WBC 2.6* 2.5* 1.6* 3.8*  NEUTROABS 1.7 1.4* 1.2* 3.2  HGB 9.3* 9.3* 8.9* 8.6*  HCT 29.2* 29.1* 28.2* 27.3*  MCV 109.0* 109.8* 111.0* 109.2*  PLT 56* 59* 58* 65*   Cardiac Enzymes: Recent Labs  Lab 08/01/17 1804  TROPONINI <0.03   BNP: Invalid input(s): POCBNP CBG: No results for input(s): GLUCAP in the last 168 hours. D-Dimer No results for input(s): DDIMER in the last 72 hours. Hgb A1c No results for input(s): HGBA1C in the last 72 hours. Lipid Profile No results for input(s): CHOL, HDL, LDLCALC, TRIG, CHOLHDL, LDLDIRECT in the last 72 hours. Thyroid function studies No results for input(s): TSH, T4TOTAL, T3FREE, THYROIDAB in the last 72 hours.  Invalid input(s): FREET3 Anemia work up Recent Labs    08/01/17 1136 08/01/17 1137  VITAMINB12 1,787*  --   FERRITIN  --  114  RETICCTPCT 3.4*  --    Urinalysis    Component Value Date/Time   COLORURINE YELLOW 02/09/2014 0848   APPEARANCEUR CLEAR 02/09/2014 0848   LABSPEC 1.025 02/09/2014 0848   PHURINE 5.5 02/09/2014 0848   GLUCOSEU NEGATIVE 02/09/2014 0848   HGBUR SMALL (A) 02/09/2014 0848   BILIRUBINUR Neg 08/14/2014 1051   KETONESUR NEGATIVE 02/09/2014 0848   PROTEINUR 30+ 08/14/2014 1051   PROTEINUR 30 (A) 02/04/2009 1045   UROBILINOGEN 0.2 08/14/2014 1051   UROBILINOGEN 0.2 02/09/2014 0848   NITRITE Neg 08/14/2014 1051   NITRITE NEGATIVE 02/09/2014 0848   LEUKOCYTESUR Negative 08/14/2014 1051   Sepsis Labs Invalid input(s): PROCALCITONIN,  WBC,  LACTICIDVEN Microbiology No results found for this or any previous visit (from the past 240 hour(s)).   Time coordinating discharge: 32 minutes  SIGNED:  Chipper Oman, MD  [facility] 08/03/2017, 11:42 AM  Pager please text page via  [email or web address]  Note - This record has been created using [employer]. Chart creation errors have been sought, but may not  always have been located. Such creation errors do not reflect on the standard of medical care.

## 2017-08-03 NOTE — Care Management Note (Signed)
Case Management Note  Patient Details  Name: [name] MRN: [id number] Date of Birth: 1935/07/31  Subjective/Objective:Qualified for home 02-home 02 orderd. [place] dme rep Santiago Glad aware of Home 02 order,& d/c today to deliver home 02 travel tank to rm prior d/c. No further CM needs.                    Action/Plan:d/c home w/home 02   Expected Discharge Date:  08/03/17               Expected Discharge Plan:  Home/Self Care  In-House Referral:     Discharge planning Services  CM Consult  Post Acute Care Choice:    Choice offered to:  Patient  DME Arranged:  Oxygen DME Agency:  [place]:    [facility] Agency:     Status of Service:  Completed, signed off  If discussed at [place] of Stay Meetings, dates discussed:    Additional Comments:  Dessa Phi, RN 08/03/2017, 12:53 PM

## 2017-08-03 NOTE — Progress Notes (Signed)
Pt and Wife given discharge teaching including medications and schedules.On Discharge Medication list Coumadin was 5 mg and follow up with [facility] on Monday. Pt states Pharmacist stated during rounds that Dose was to be 2.5 mg for three days and then back to 5 mg on Monday and follow up with the [facility]in Clinic. Doreene Eland Pharmacist paged and discussed Coumadin Dose. Coumadin Dose Per Pharmacist who verified with MD is to be 2.5 mg Friday-Sunday and then dose back to 5 mg on Monday and follow up with the Coumadin Clinic. Addendum made to AVS medications and Pt and Wife updated and new copy of AVS given to Pt prior to  Discharge.

## 2017-08-06 ENCOUNTER — Telehealth: Payer: Self-pay | Admitting: *Deleted

## 2017-08-06 NOTE — Telephone Encounter (Signed)
Pt called & stated d/c from hospital & taking prednisone taper. Per Renata Caprice the pt is on Prednisone 10mg  4 tabs for 3 days, 3 tabs for 4 days, 2 tabs for 3 days, then 1 tab for 4 days per d/c summary. Pt aware med interacts with Coumadin & appt set for tomorrow.

## 2017-08-07 ENCOUNTER — Ambulatory Visit (INDEPENDENT_AMBULATORY_CARE_PROVIDER_SITE_OTHER): Payer: Medicare Other | Admitting: *Deleted

## 2017-08-07 DIAGNOSIS — Z5181 Encounter for therapeutic drug level monitoring: Secondary | ICD-10-CM | POA: Diagnosis not present

## 2017-08-07 DIAGNOSIS — Z952 Presence of prosthetic heart valve: Secondary | ICD-10-CM

## 2017-08-07 DIAGNOSIS — I4891 Unspecified atrial fibrillation: Secondary | ICD-10-CM

## 2017-08-07 LAB — POCT INR: INR: 2.4

## 2017-08-07 NOTE — Patient Instructions (Signed)
Description   Continue on same dosage 1 tablet daily except 1/2 tablet each Sundays, Wednesdays, and Fridays.  Repeat INR in 1 week.  Call with any new medications or procedures [phone]

## 2017-08-13 ENCOUNTER — Encounter: Payer: Self-pay | Admitting: Internal Medicine

## 2017-08-13 ENCOUNTER — Ambulatory Visit (INDEPENDENT_AMBULATORY_CARE_PROVIDER_SITE_OTHER): Payer: Medicare Other | Admitting: Internal Medicine

## 2017-08-13 VITALS — BP 114/66 | HR 77 | Temp 98.0°F | Ht 73.0 in | Wt 255.0 lb

## 2017-08-13 DIAGNOSIS — I10 Essential (primary) hypertension: Secondary | ICD-10-CM

## 2017-08-13 DIAGNOSIS — I5032 Chronic diastolic (congestive) heart failure: Secondary | ICD-10-CM

## 2017-08-13 DIAGNOSIS — I482 Chronic atrial fibrillation, unspecified: Secondary | ICD-10-CM

## 2017-08-13 DIAGNOSIS — E118 Type 2 diabetes mellitus with unspecified complications: Secondary | ICD-10-CM

## 2017-08-13 MED ORDER — TORSEMIDE 20 MG PO TABS: 40.0000 mg | ORAL_TABLET | Freq: Every day | ORAL | 3 refills | Status: DC

## 2017-08-13 NOTE — Assessment & Plan Note (Signed)
BP Readings from Last 3 Encounters:  08/13/17 114/66  08/03/17 107/60  08/01/17 (!) 75/54

## 2017-08-13 NOTE — Assessment & Plan Note (Signed)
Metformin XR 

## 2017-08-13 NOTE — Assessment & Plan Note (Signed)
Bad LE swelling Increase Demadex to 40 mg/d (it helped in the past) O2

## 2017-08-13 NOTE — Assessment & Plan Note (Signed)
Coumadin 

## 2017-08-13 NOTE — Progress Notes (Signed)
Subjective:  Patient ID: [name], male    DOB: [birth year]  Age: 82 y.o. MRN: [id number]  CC: No chief complaint on file.   HPI [name] presents for post-hosp visit for COPD 5/1-5/3 C/o fatigue   Acute on chronic respiratory failure with hypoxia Multifactorial secondary to COPD, CHF possible contributing.  Procalcitonin 0.15 no antibiotics indicated Pulse ox with ambulation check and patient desat, will discharge patient on home oxygen with ambulation Initially treated with high-dose IV Solu-Medrol, will discharge on prednisone taper. Continue supportive treatment with albuterol, Mucinex and will add Symbicort twice daily Patient responded well to diuresis, continue Demadex 30 mg daily Follow-up with PCP  Left lower lobe pulmonary nodule 5 mm nodule found on chest CT Follow-up with CT in 1 year is recommended  Acute on chronic systolic CHF exacerbation Elevated BNP at 784 Patient received 2 doses of IV Lasix with good diuresis Continue Coreg, Enalapril has been on hold by card PTA  Continue torsemide  A. fib/aortic mechanical valve Rate well controlled on beta-blocker, anticoagulation with warfarin.  CKD stage III Cr at baseline  Continue to monitor renal function while receiving IV Lasix Avoid hypotension and nephrotoxic agents.  All other chronic medical condition were stable during the hospitalization.  On the day of the discharge the patient's vitals were stable, and no other acute medical condition were reported by patient. the patient was felt safe to be discharge to home.      Outpatient Medications Prior to Visit  Medication Sig Dispense Refill  . acetaminophen (TYLENOL) 500 MG tablet Take 500-1,000 mg by mouth daily as needed (for pain).     . budesonide-formoterol (SYMBICORT) 160-4.5 MCG/ACT inhaler Inhale 2 puffs into the lungs 2 (two) times daily. 1 Inhaler 0  . carvedilol (COREG) 25 MG tablet Take 1 tablet (25 mg total) by mouth 2 (two)  times daily with a meal. 180 tablet 3  . Cholecalciferol (VITAMIN D3) 2000 units capsule Take 1 capsule (2,000 Units total) by mouth daily. 100 capsule 3  . Cyanocobalamin (VITAMIN B-12) 3000 MCG SUBL Take 3,000 mcg by mouth daily.     Marland Kitchen dextromethorphan-guaiFENesin (MUCINEX DM) 30-600 MG 12hr tablet Take 1 tablet by mouth 2 (two) times daily. 20 tablet 0  . Dietary Management Product (VASCULERA) TABS Take 1 tablet by mouth daily. 90 tablet 3  . ezetimibe-simvastatin (VYTORIN) 10-20 MG tablet Take 1 tablet by mouth at bedtime. 90 tablet 3  . fluocinonide cream (LIDEX) 6.29 % Apply 1 application topically 2 (two) times daily. (Patient taking differently: Apply 1 application topically 2 (two) times daily as needed (to affected areas of legs). ) 30 g 3  . folic acid (FOLVITE) 1 MG tablet Take 1 tablet (1 mg total) by mouth daily.    Marland Kitchen glucose blood (ONE TOUCH ULTRA TEST) test strip USE AS DIRECTED TO TEST BLOOD GLUCOSE  EVERY OTHER DAY DX: 250.00 50 each 2  . glucose blood (ONETOUCH VERIO) test strip Use as instructed to check blood sugar once daily. Dx Code E11.8 100 each 3  . metFORMIN (GLUCOPHAGE-XR) 750 MG 24 hr tablet TAKE 2 TABLETS (1,500 MG TOTAL) BY MOUTH DAILY. 180 tablet 2  . NON FORMULARY BiPAP: At bedtime    . NON FORMULARY ONE TOUCH ULTRA TEST STRIPS  AND LANCETS    . ONETOUCH DELICA LANCETS FINE MISC Use to obtain a blood specimen every day Dx code E11.8 100 each 1  . predniSONE (DELTASONE) 10 MG tablet Take 4 tablets for  3 days; Take 3 tablets for 4 days; Take 2 tablets for 3 days; Take 1 tablet for 4 days 34 tablet 0  . PROAIR HFA 108 (90 Base) MCG/ACT inhaler Inhale 2 puffs into the lungs every 4 (four) hours as needed (for chest congestion). 18 g 1  . torsemide (DEMADEX) 20 MG tablet Take 1 tablet (20 mg total) by mouth daily. 90 tablet 3  . warfarin (COUMADIN) 5 MG tablet TAKE AS DIRECTED BY COUMADIN CLINIC (Patient taking differently: Take 2.5-5 mg by mouth See admin instructions.  Take 2.5 mg by mouth in the evening on Sun/Wed/Fri and 5 mg on Mon/Tues/Thurs/Sat) 90 tablet 1   No facility-administered medications prior to visit.     ROS Review of Systems  Constitutional: Negative for appetite change, fatigue and unexpected weight change.  HENT: Positive for congestion. Negative for nosebleeds, sneezing, sore throat and trouble swallowing.   Eyes: Negative for itching and visual disturbance.  Respiratory: Positive for shortness of breath. Negative for cough.   Cardiovascular: Positive for leg swelling. Negative for chest pain and palpitations.  Gastrointestinal: Negative for abdominal distention, blood in stool, diarrhea and nausea.  Genitourinary: Negative for frequency and hematuria.  Musculoskeletal: Positive for gait problem. Negative for back pain, joint swelling and neck pain.  Skin: Negative for rash.  Neurological: Negative for dizziness, tremors, speech difficulty and weakness.  Psychiatric/Behavioral: Negative for agitation, dysphoric mood and sleep disturbance. The patient is not nervous/anxious.     Objective:  BP 114/66 (BP Location: Right Arm, Patient Position: Sitting, Cuff Size: Large)   Pulse 77   Temp 98 F (36.7 C) (Oral)   Ht 6\' 1"  (1.854 m)   Wt 255 lb (115.7 kg)   SpO2 96% Comment: 1 liter of o2  BMI 33.64 kg/m   BP Readings from Last 3 Encounters:  08/13/17 114/66  08/03/17 107/60  08/01/17 (!) 75/54    Wt Readings from Last 3 Encounters:  08/13/17 255 lb (115.7 kg)  08/01/17 250 lb 8 oz (113.6 kg)  08/01/17 253 lb 11.2 oz (115.1 kg)    Physical Exam  Constitutional: He is oriented to person, place, and time. He appears well-developed. No distress.  NAD  HENT:  Mouth/Throat: Oropharynx is clear and moist.  Eyes: Pupils are equal, round, and reactive to light. Conjunctivae are normal.  Neck: Normal range of motion. No JVD present. No thyromegaly present.  Cardiovascular: Normal rate, regular rhythm, normal heart sounds and  intact distal pulses. Exam reveals no gallop and no friction rub.  No murmur heard. Pulmonary/Chest: Effort normal and breath sounds normal. No respiratory distress. He has no wheezes. He has no rales. He exhibits no tenderness.  Abdominal: Soft. Bowel sounds are normal. He exhibits no distension and no mass. There is no tenderness. There is no rebound and no guarding.  Musculoskeletal: Normal range of motion. He exhibits edema. He exhibits no tenderness.  Lymphadenopathy:    He has no cervical adenopathy.  Neurological: He is alert and oriented to person, place, and time. He has normal reflexes. No cranial nerve deficit. He exhibits normal muscle tone. He displays a negative Romberg sign. Coordination and gait normal.  Skin: Skin is warm and dry. No rash noted.  Psychiatric: He has a normal mood and affect. His behavior is normal. Judgment and thought content normal.   Coarse BS B O2 is on B edema 3+ on LEs  Dg Chest 2 View  Result Date: 08/01/2017 CLINICAL DATA:  82 year old male with shortness of  breath cough congestion and wheezing. EXAM: CHEST - 2 VIEW COMPARISON:  Chest radiographs 07/10/2017 and earlier. FINDINGS: Semi upright AP and lateral views of the chest. Moderate to severe cardiomegaly is stable. Prior CABG. Calcified aortic atherosclerosis. Other mediastinal contours are within normal limits. Visualized tracheal air column is within normal limits. Stable lung volumes. No pneumothorax, pulmonary edema, pleural effusion or confluent pulmonary opacity. No acute osseous abnormality identified. Negative visible bowel gas pattern. IMPRESSION: Stable cardiomegaly. No acute cardiopulmonary abnormality. Electronically Signed   By: Genevie Ann M.D.   On: 08/01/2017 15:42   Ct Chest Wo Contrast  Result Date: 08/01/2017 CLINICAL DATA:  Increase shortness of breath. Hypotension. Chest congestion and cough. EXAM: CT CHEST WITHOUT CONTRAST TECHNIQUE: Multidetector CT imaging of the chest was performed  following the standard protocol without IV contrast. COMPARISON:  12/31/2008 FINDINGS: Cardiovascular: The heart is markedly enlarged. Coronary artery calcification is evident. Atherosclerotic calcification is noted in the wall of the thoracic aorta. Mediastinum/Nodes: Scattered borderline enlarged mediastinal lymph nodes are similar to prior. No evidence for gross hilar lymphadenopathy although assessment is limited by the lack of intravenous contrast on today's study. The esophagus has normal imaging features. There is no axillary lymphadenopathy. Lungs/Pleura: Dependent a mucus/debris is seen in the trachea and left mainstem bronchus. Centrilobular emphysema noted bilaterally. Bronchial wall thickening is noted bilaterally with minimal small airway impaction in the right lower lobe. 5 mm left lower lobe pulmonary nodule is new in the interval. 3 mm calcified granuloma in the left lower lobe (78/5) is stable no pulmonary edema or pleural effusion. Upper Abdomen: Small volume ascites is noted in the upper abdomen. Musculoskeletal: Bone windows reveal no worrisome lytic or sclerotic osseous lesions. IMPRESSION: 1. No acute findings in the chest. 2. There is some minimal dependent mucus/fluid/debris in the trachea and left mainstem bronchus. 3. 5 mm left lower lobe pulmonary nodule, new since prior study. No follow-up needed if patient is low-risk. Non-contrast chest CT can be considered in 12 months if patient is high-risk. This recommendation follows the consensus statement: Guidelines for Management of Incidental Pulmonary Nodules Detected on CT Images: From the Fleischner Society 2017; Radiology 2017; 284:228-243. 4. Small volume ascites noted in the upper abdomen. Electronically Signed   By: Misty Stanley M.D.   On: 08/01/2017 18:23    Assessment & Plan:   There are no diagnoses linked to this encounter. I am having [name] maintain his NON FORMULARY, NON FORMULARY, folic acid, glucose blood,  warfarin, ezetimibe-simvastatin, fluocinonide cream, Vitamin D3, VASCULERA, carvedilol, glucose blood, ONETOUCH DELICA LANCETS FINE, torsemide, metFORMIN, Vitamin B-12, acetaminophen, dextromethorphan-guaiFENesin, budesonide-formoterol, PROAIR HFA, and predniSONE.  No orders of the defined types were placed in this encounter.    Follow-up: No follow-ups on file.  Walker Kehr, MD

## 2017-08-14 ENCOUNTER — Telehealth: Payer: Self-pay | Admitting: Cardiology

## 2017-08-14 NOTE — Telephone Encounter (Signed)
New message  Patient states he is now eligible for new cpap/cpap equipment.     1) What problem are you experiencing? Requesting order for new equiptment  2) Who is your medical equipment company? [employer]   Please route to the sleep study assistant.

## 2017-08-15 ENCOUNTER — Ambulatory Visit (INDEPENDENT_AMBULATORY_CARE_PROVIDER_SITE_OTHER): Payer: Medicare Other | Admitting: *Deleted

## 2017-08-15 DIAGNOSIS — Z952 Presence of prosthetic heart valve: Secondary | ICD-10-CM

## 2017-08-15 DIAGNOSIS — Z5181 Encounter for therapeutic drug level monitoring: Secondary | ICD-10-CM | POA: Diagnosis not present

## 2017-08-15 DIAGNOSIS — I4891 Unspecified atrial fibrillation: Secondary | ICD-10-CM

## 2017-08-15 LAB — POCT INR: INR: 2.8

## 2017-08-15 NOTE — Patient Instructions (Signed)
Description   Continue on same dosage 1 tablet daily except 1/2 tablet each Sundays, Wednesdays, and Fridays.  Repeat INR in 2 weeks.  Call with any new medications or procedures [phone] Will finish Prednisone on May 17th

## 2017-08-17 ENCOUNTER — Telehealth: Payer: Self-pay | Admitting: Emergency Medicine

## 2017-08-17 NOTE — Telephone Encounter (Signed)
Called patient to verify what supplies were needed and patient informed me that he ants a new BiPAP unit as he is now eligible. Per medicare guidelines patient must have office visit within 30 days. Patient has seen PCP within the week so call was placed to PCP to see if he would be willing to order bipap unit as Dr Landis Gandy first available is in July. Awaiting response from PCP. Patient is aware and agreeable.

## 2017-08-17 NOTE — Telephone Encounter (Signed)
Copied from [place] [id number]. Topic: General - Other >> Aug 17, 2017  8:49 AM Yvette Rack wrote: Reason for CRM: Patty Sermons from [facility] [phone] calling from Dr Radford Pax office Calling for a order for pt to have a by pap machine

## 2017-08-17 NOTE — Telephone Encounter (Signed)
We can ref the pt to Pulmonology for a sleep consult Thx

## 2017-08-17 NOTE — Telephone Encounter (Signed)
Called Tay from Cardiology and they will get pt in to see them to order supplies.

## 2017-08-20 NOTE — Telephone Encounter (Signed)
Confirmed appt with patient 10/15/17 at 8am and informed patient. patient will need appointment within 30 days of orders being placed for new device.

## 2017-08-21 ENCOUNTER — Encounter: Payer: Self-pay | Admitting: Internal Medicine

## 2017-08-21 DIAGNOSIS — R29898 Other symptoms and signs involving the musculoskeletal system: Secondary | ICD-10-CM

## 2017-08-21 DIAGNOSIS — I5032 Chronic diastolic (congestive) heart failure: Secondary | ICD-10-CM

## 2017-08-22 ENCOUNTER — Emergency Department (HOSPITAL_COMMUNITY)
Admission: EM | Admit: 2017-08-22 | Discharge: 2017-08-23 | Disposition: A | Discharge status: Home / Self Care | Payer: Medicare Other | Attending: Emergency Medicine | Admitting: Emergency Medicine

## 2017-08-22 ENCOUNTER — Emergency Department (HOSPITAL_COMMUNITY): Payer: Medicare Other

## 2017-08-22 ENCOUNTER — Other Ambulatory Visit: Payer: Self-pay

## 2017-08-22 ENCOUNTER — Encounter (HOSPITAL_COMMUNITY): Payer: Self-pay | Admitting: *Deleted

## 2017-08-22 DIAGNOSIS — S199XXA Unspecified injury of neck, initial encounter: Secondary | ICD-10-CM | POA: Diagnosis not present

## 2017-08-22 DIAGNOSIS — Z87891 Personal history of nicotine dependence: Secondary | ICD-10-CM | POA: Insufficient documentation

## 2017-08-22 DIAGNOSIS — Z952 Presence of prosthetic heart valve: Secondary | ICD-10-CM | POA: Diagnosis not present

## 2017-08-22 DIAGNOSIS — W010XXA Fall on same level from slipping, tripping and stumbling without subsequent striking against object, initial encounter: Secondary | ICD-10-CM | POA: Diagnosis not present

## 2017-08-22 DIAGNOSIS — S0990XA Unspecified injury of head, initial encounter: Secondary | ICD-10-CM

## 2017-08-22 DIAGNOSIS — Y92009 Unspecified place in unspecified non-institutional (private) residence as the place of occurrence of the external cause: Secondary | ICD-10-CM | POA: Diagnosis not present

## 2017-08-22 DIAGNOSIS — I110 Hypertensive heart disease with heart failure: Secondary | ICD-10-CM | POA: Diagnosis not present

## 2017-08-22 DIAGNOSIS — J449 Chronic obstructive pulmonary disease, unspecified: Secondary | ICD-10-CM | POA: Insufficient documentation

## 2017-08-22 DIAGNOSIS — E119 Type 2 diabetes mellitus without complications: Secondary | ICD-10-CM | POA: Insufficient documentation

## 2017-08-22 DIAGNOSIS — I2510 Atherosclerotic heart disease of native coronary artery without angina pectoris: Secondary | ICD-10-CM | POA: Diagnosis not present

## 2017-08-22 DIAGNOSIS — S00211A Abrasion of right eyelid and periocular area, initial encounter: Secondary | ICD-10-CM | POA: Diagnosis not present

## 2017-08-22 DIAGNOSIS — Z7901 Long term (current) use of anticoagulants: Secondary | ICD-10-CM | POA: Insufficient documentation

## 2017-08-22 DIAGNOSIS — Z7984 Long term (current) use of oral hypoglycemic drugs: Secondary | ICD-10-CM | POA: Diagnosis not present

## 2017-08-22 DIAGNOSIS — Z951 Presence of aortocoronary bypass graft: Secondary | ICD-10-CM | POA: Insufficient documentation

## 2017-08-22 DIAGNOSIS — Y9301 Activity, walking, marching and hiking: Secondary | ICD-10-CM | POA: Insufficient documentation

## 2017-08-22 DIAGNOSIS — W19XXXA Unspecified fall, initial encounter: Secondary | ICD-10-CM

## 2017-08-22 DIAGNOSIS — Y999 Unspecified external cause status: Secondary | ICD-10-CM | POA: Insufficient documentation

## 2017-08-22 DIAGNOSIS — I5032 Chronic diastolic (congestive) heart failure: Secondary | ICD-10-CM | POA: Insufficient documentation

## 2017-08-22 DIAGNOSIS — S0993XA Unspecified injury of face, initial encounter: Secondary | ICD-10-CM | POA: Diagnosis not present

## 2017-08-22 DIAGNOSIS — S0081XA Abrasion of other part of head, initial encounter: Secondary | ICD-10-CM

## 2017-08-22 NOTE — ED Triage Notes (Signed)
Pt tripped and fell over a rug at home and fell hitting head on the floor. Pt has lac to R eyebrow, bleeding controlled at present, does take warfarin.

## 2017-08-22 NOTE — ED Provider Notes (Signed)
Patient placed in Quick Look pathway, seen and evaluated   Chief Complaint: Fall  HPI:   Markeise Mathews is a 82 y.o. male, presenting to the ED with a mechanical fall from standing that occurred shortly prior to arrival.  States he tripped over an uneven surface, hitting his head on the floor.  Complains of laceration over the right eyebrow with associated pain and swelling. Denies LOC, vision abnormality, neck/back pain, nausea/vomiting, chest pain, shortness of breath, abdominal pain, hip pain, neurologic deficits.  ROS: Fall with head injury (one)  Physical Exam:   Gen: No distress  Neuro: Awake and Alert  Skin: Warm    Focused Exam:   No diaphoresis.  No pallor.  HEENT: Laceration in the region of the right eyebrow with associated swelling, bruising, and tenderness.  No noted crepitus, deformity, or instability.  Pulmonary: No increased work of breathing.  Speaks in full sentences without difficulty. No tachypnea.  Cardiac: No tachycardia.  Peripheral pulses intact.  Neurologic: A&Ox4.  Cranial nerves III through XII grossly intact.  MSK: Normal motor function intact in all extremities. No midline spinal tenderness.    Initiation of care has begun. The patient has been counseled on the process, plan, and necessity for staying for the completion/evaluation, and the remainder of the medical screening examination   Layla Maw 08/22/17 Valarie Cones, MD 08/22/17 [phone]

## 2017-08-23 NOTE — ED Provider Notes (Signed)
[facility] Provider Note   CSN: [id number] Arrival date & time: 08/22/17  1906     History   Chief Complaint Chief Complaint  Patient presents with  . Fall    HPI Antione Hlavaty is a 82 y.o. male.  82yo M w/ PMH including valve replacement on coumadin, A fib, CHF, OSA, CAD s/p CABG who p/w fall and head injury. Just prior to arrival, he was walking in his house and tripped over an uneven surface, falling forward and striking his face, breaking his glasses.  He sustained a laceration over his right eyebrow.  He reports little to no pain.  No loss of consciousness, vomiting, vision changes, confusion, extremity weakness, neck pain, or other complaints.  No other injuries.  He is on Coumadin.  Tetanus is up-to-date.  Wife notes that he initially blood a lot at home but bleeding has stopped.  The history is provided by the patient and the spouse.  Fall     Past Medical History:  Diagnosis Date  . Aortic stenosis   . Carotid artery disease ([place]) 1994   s/p left carotid endarerectomy   . CHF with unknown LVEF ([facility])    47 %  . Chronic atrial fibrillation ([place])   . Hx of CABG   . Hypertension   . Left ventricular dysfunction 2007   LVEF 47%   . OSA (obstructive sleep apnea)   . Rheumatic fever   . Subclavian bypass stenosis ([place])   . Vitamin B12 deficiency     Patient Active Problem List   Diagnosis Date Noted  . Hypoxia   . COPD exacerbation ([place]) 08/01/2017  . Pancytopenia ([place]) 08/01/2017  . Epistaxis 07/11/2017  . Actinic keratoses 04/05/2017  . Leg wound, right 02/18/2016  . Edema 02/18/2016  . Long term current use of anticoagulant therapy 02/18/2016  . Hyperkalemia 07/25/2015  . Atherosclerosis of native arteries of extremity with intermittent claudication ([place]) 10/21/2014  . Thrombocytopenia ([place]) 10/20/2014  . Macrocytosis without anemia 10/20/2014  . OSA (obstructive sleep apnea) 05/15/2013  . Encounter for  therapeutic drug monitoring 05/01/2013  . History of mechanical aortic valve replacement 01/02/2013  . Obesity (BMI 30-39.9) 10/20/2012  . Type II diabetes mellitus with complication ([place]) 71/09/2692  . Acute asthmatic bronchitis 01/18/2012  . ACUTE KIDNEY FAILURE UNSPECIFIED 02/04/2009  . CUTANEOUS ERUPTIONS, DRUG-INDUCED 02/04/2009  . HLD (hyperlipidemia) 02/03/2009  . Essential hypertension 02/03/2009  . Coronary atherosclerosis 02/03/2009  . Atrial fibrillation ([place]) 02/03/2009  . Chronic diastolic heart failure ([place]) 02/03/2009  . CAROTID ENDARTERECTOMY, LEFT, HX OF 02/03/2009  . INF&INFLAM REACT DUE CARD DEVICE IMPLANT&GRAFT 12/30/2008    Past Surgical History:  Procedure Laterality Date  . [place]   replaced due to aortic stenosis, [employer] mechanical prostesis  . CAROTID ENDARTERECTOMY Left 1997   subclavian bypass Done in [place]  . CORONARY ARTERY BYPASS GRAFT  1994   w SVG to RCA and SVG to circumflex  . heart bypass     Done in [place] Medications    Prior to Admission medications   Medication Sig Start Date End Date Taking? Authorizing Provider  acetaminophen (TYLENOL) 500 MG tablet Take 500-1,000 mg by mouth daily as needed (for pain).     [provider]  budesonide-formoterol (SYMBICORT) 160-4.5 MCG/ACT inhaler Inhale 2 puffs into the lungs 2 (two) times daily. 08/03/17   Doreatha Lew, MD  carvedilol (COREG) 25  MG tablet Take 1 tablet (25 mg total) by mouth 2 (two) times daily with a meal. 04/12/17   Belva Crome, MD  Cholecalciferol (VITAMIN D3) 2000 units capsule Take 1 capsule (2,000 Units total) by mouth daily. 04/05/17   [name], [name], MD  Cyanocobalamin (VITAMIN B-12) 3000 MCG SUBL Take 3,000 mcg by mouth daily.     [provider]  dextromethorphan-guaiFENesin (MUCINEX DM) 30-600 MG 12hr tablet Take 1 tablet by mouth 2 (two) times daily. 08/03/17   Doreatha Lew, MD  Dietary Management  Product [facility]) TABS Take 1 tablet by mouth daily. 04/05/17   [name], [name], MD  ezetimibe-simvastatin (VYTORIN) 10-20 MG tablet Take 1 tablet by mouth at bedtime. 04/05/17   [name], [name], MD  fluocinonide cream (LIDEX) 6.78 % Apply 1 application topically 2 (two) times daily. Patient taking differently: Apply 1 application topically 2 (two) times daily as needed (to affected areas of legs).  04/05/17   [name], [name], MD  folic acid (FOLVITE) 1 MG tablet Take 1 tablet (1 mg total) by mouth daily. 12/02/14   Brunetta Genera, MD  glucose blood (ONE TOUCH ULTRA TEST) test strip USE AS DIRECTED TO TEST BLOOD GLUCOSE  EVERY OTHER DAY DX: 250.00 08/23/16   Elayne Snare, MD  glucose blood (ONETOUCH VERIO) test strip Use as instructed to check blood sugar once daily. Dx Code E11.8 05/07/17   Elayne Snare, MD  metFORMIN (GLUCOPHAGE-XR) 750 MG 24 hr tablet TAKE 2 TABLETS (1,500 MG TOTAL) BY MOUTH DAILY. 07/03/17   Elayne Snare, MD  NON FORMULARY BiPAP: At bedtime    [provider]  NON FORMULARY ONE TOUCH ULTRA TEST STRIPS  AND LANCETS    [provider]  [facility] DELICA LANCETS FINE MISC Use to obtain a blood specimen every day Dx code E11.8 05/07/17   Elayne Snare, MD  predniSONE (DELTASONE) 10 MG tablet Take 4 tablets for 3 days; Take 3 tablets for 4 days; Take 2 tablets for 3 days; Take 1 tablet for 4 days 08/03/17   Patrecia Pour, Christean Grief, MD  [facility] HFA 108 [phone] Base) MCG/ACT inhaler Inhale 2 puffs into the lungs every 4 (four) hours as needed (for chest congestion). 08/03/17   Patrecia Pour, Christean Grief, MD  torsemide (DEMADEX) 20 MG tablet Take 2 tablets (40 mg total) by mouth daily. 08/13/17   [name], [name], MD  warfarin (COUMADIN) 5 MG tablet TAKE AS DIRECTED BY COUMADIN CLINIC Patient taking differently: Take 2.5-5 mg by mouth See admin instructions. Take 2.5 mg by mouth in the evening on Sun/Wed/Fri and 5 mg on Mon/Tues/Thurs/Sat 03/21/17   Belva Crome, MD    Family  History Family History  Problem Relation Age of Onset  . Cancer Father        lung   . Hypertension Mother     Social History Social History   Tobacco Use  . Smoking status: Former Smoker    Packs/day: 1.00    Years: 30.00    Pack years: 30.00    Types: Cigarettes    Last attempt to quit: 04/03/1992    Years since quitting: 25.4  . Smokeless tobacco: Never Used  Substance Use Topics  . Alcohol use: Yes    Comment: 4-6 ounces of wine daily  . Drug use: No    Comment: Half a cup a day.     Allergies   Rifampin and Vancomycin   Review of Systems Review of Systems All other systems  reviewed and are negative except that which was mentioned in HPI   Physical Exam Updated Vital Signs BP (!) 115/57 (BP Location: Right Arm)   Pulse 78   Temp 98.3 F (36.8 C) (Oral)   Resp 14   SpO2 100%   Physical Exam  Constitutional: He is oriented to person, place, and time. He appears well-developed and well-nourished. No distress.  Awake, alert  HENT:  Head: Normocephalic.  R periorbital ecchymosis; large clot on R eyebrow--once removed, abrasion through eyebrow with minimal oozing of blood  Eyes: Pupils are equal, round, and reactive to light. Conjunctivae and EOM are normal.  Neck: Neck supple.  Cardiovascular: Normal rate and regular rhythm.  Murmur heard. Pulmonary/Chest: Effort normal and breath sounds normal. No respiratory distress. He exhibits no tenderness.  Abdominal: Soft. Bowel sounds are normal. He exhibits no distension. There is no tenderness.  Musculoskeletal: He exhibits edema (3+ pitting BLE). He exhibits no deformity.  Neurological: He is alert and oriented to person, place, and time. He has normal reflexes. No cranial nerve deficit. He exhibits normal muscle tone.  Fluent speech, normal finger-to-nose testing, negative pronator drift 5/5 strength and normal sensation x all 4 extremities  Skin: Skin is warm and dry.  Hematoma and ecchymosis on R lateral  chest near axilla  Psychiatric: He has a normal mood and affect. Judgment and thought content normal.  Nursing note and vitals reviewed.    ED Treatments / Results  Labs (all labs ordered are listed, but only abnormal results are displayed) Labs Reviewed - No data to display  EKG None  Radiology Ct Head Wo Contrast  Result Date: 08/22/2017 CLINICAL DATA:  Patient tripped and fell over rug at home hitting head on the floor. Laceration to right eyebrow. Patient on warfarin. EXAM: CT HEAD WITHOUT CONTRAST CT MAXILLOFACIAL WITHOUT CONTRAST CT CERVICAL SPINE WITHOUT CONTRAST TECHNIQUE: Multidetector CT imaging of the head, cervical spine, and maxillofacial structures were performed using the standard protocol without intravenous contrast. Multiplanar CT image reconstructions of the cervical spine and maxillofacial structures were also generated. COMPARISON:  None. FINDINGS: CT HEAD FINDINGS Brain: Mild age related involutional changes of the brain. No acute intracranial hemorrhage, edema or midline shift. Remote appearing right caudate head lacunar infarct. Chronic appearing minimal small vessel ischemia of periventricular white matter. No intra-axial mass nor extra-axial fluid collections. Vascular: No hyperdense vessel sign. Moderate atherosclerosis of the carotid siphons bilaterally. Skull: No skull fracture. Other: Right supraorbital soft tissue laceration and contusion. CT MAXILLOFACIAL FINDINGS Osseous: Intact maxillofacial bones. Orbits: The orbits appear intact. No retrobulbar hemorrhage or hematoma. No lens detachment. Intact globes. Sinuses: Chronic left maxillary sinusitis with circumferential mucosal thickening and inspissated mucus noted within. No air-fluid levels. Soft tissues: Right periorbital soft tissue swelling with supraorbital laceration. CT CERVICAL SPINE FINDINGS Alignment: Straightening of cervical lordosis. Intact craniocervical relationship and atlantodental interval. Minimal  grade 1 anterolisthesis of C2 on C3. Skull base and vertebrae: No acute fracture. No primary bone lesion or focal pathologic process. Soft tissues and spinal canal: No prevertebral soft tissue swelling or visible canal hematoma. Disc levels: Disc space flattening at all levels of the cervical spine. Large central to right central disc herniation at C3-4 impressing upon the adjacent cord and contributing to moderate central canal stenosis. Small posterior osteophytes at C6-7 slightly impressing upon the adjacent thecal sac. Mild bilateral foraminal encroachment at this level from uncinate spurring. Upper chest: Centrilobular emphysema of the included lung apices. Aortic atherosclerosis and atherosclerosis of the origins  of the great vessels. Extracranial carotid arteriosclerosis is noted bilaterally. Other: None IMPRESSION: 1. No acute intracranial abnormality. Chronic minimal small vessel ischemia and right caudate lacunar infarct. 2. Right supraorbital soft tissue swelling with laceration. No acute maxillofacial fracture. 3. Chronic left maxillary sinus mucosal thickening. 4. No acute cervical spine fracture. 5. Central to right central disc herniation at C3-4 contributing to moderate central canal stenosis. 6. Diffuse cervical spondylosis as above. 7. Centrilobular emphysema at the apices. Electronically Signed   By: Ashley Royalty M.D.   On: 08/22/2017 20:26   Ct Cervical Spine Wo Contrast  Result Date: 08/22/2017 CLINICAL DATA:  Patient tripped and fell over rug at home hitting head on the floor. Laceration to right eyebrow. Patient on warfarin. EXAM: CT HEAD WITHOUT CONTRAST CT MAXILLOFACIAL WITHOUT CONTRAST CT CERVICAL SPINE WITHOUT CONTRAST TECHNIQUE: Multidetector CT imaging of the head, cervical spine, and maxillofacial structures were performed using the standard protocol without intravenous contrast. Multiplanar CT image reconstructions of the cervical spine and maxillofacial structures were also  generated. COMPARISON:  None. FINDINGS: CT HEAD FINDINGS Brain: Mild age related involutional changes of the brain. No acute intracranial hemorrhage, edema or midline shift. Remote appearing right caudate head lacunar infarct. Chronic appearing minimal small vessel ischemia of periventricular white matter. No intra-axial mass nor extra-axial fluid collections. Vascular: No hyperdense vessel sign. Moderate atherosclerosis of the carotid siphons bilaterally. Skull: No skull fracture. Other: Right supraorbital soft tissue laceration and contusion. CT MAXILLOFACIAL FINDINGS Osseous: Intact maxillofacial bones. Orbits: The orbits appear intact. No retrobulbar hemorrhage or hematoma. No lens detachment. Intact globes. Sinuses: Chronic left maxillary sinusitis with circumferential mucosal thickening and inspissated mucus noted within. No air-fluid levels. Soft tissues: Right periorbital soft tissue swelling with supraorbital laceration. CT CERVICAL SPINE FINDINGS Alignment: Straightening of cervical lordosis. Intact craniocervical relationship and atlantodental interval. Minimal grade 1 anterolisthesis of C2 on C3. Skull base and vertebrae: No acute fracture. No primary bone lesion or focal pathologic process. Soft tissues and spinal canal: No prevertebral soft tissue swelling or visible canal hematoma. Disc levels: Disc space flattening at all levels of the cervical spine. Large central to right central disc herniation at C3-4 impressing upon the adjacent cord and contributing to moderate central canal stenosis. Small posterior osteophytes at C6-7 slightly impressing upon the adjacent thecal sac. Mild bilateral foraminal encroachment at this level from uncinate spurring. Upper chest: Centrilobular emphysema of the included lung apices. Aortic atherosclerosis and atherosclerosis of the origins of the great vessels. Extracranial carotid arteriosclerosis is noted bilaterally. Other: None IMPRESSION: 1. No acute intracranial  abnormality. Chronic minimal small vessel ischemia and right caudate lacunar infarct. 2. Right supraorbital soft tissue swelling with laceration. No acute maxillofacial fracture. 3. Chronic left maxillary sinus mucosal thickening. 4. No acute cervical spine fracture. 5. Central to right central disc herniation at C3-4 contributing to moderate central canal stenosis. 6. Diffuse cervical spondylosis as above. 7. Centrilobular emphysema at the apices. Electronically Signed   By: Ashley Royalty M.D.   On: 08/22/2017 20:26   Ct Maxillofacial Wo Contrast  Result Date: 08/22/2017 CLINICAL DATA:  Patient tripped and fell over rug at home hitting head on the floor. Laceration to right eyebrow. Patient on warfarin. EXAM: CT HEAD WITHOUT CONTRAST CT MAXILLOFACIAL WITHOUT CONTRAST CT CERVICAL SPINE WITHOUT CONTRAST TECHNIQUE: Multidetector CT imaging of the head, cervical spine, and maxillofacial structures were performed using the standard protocol without intravenous contrast. Multiplanar CT image reconstructions of the cervical spine and maxillofacial structures were also generated. COMPARISON:  None. FINDINGS: CT HEAD FINDINGS Brain: Mild age related involutional changes of the brain. No acute intracranial hemorrhage, edema or midline shift. Remote appearing right caudate head lacunar infarct. Chronic appearing minimal small vessel ischemia of periventricular white matter. No intra-axial mass nor extra-axial fluid collections. Vascular: No hyperdense vessel sign. Moderate atherosclerosis of the carotid siphons bilaterally. Skull: No skull fracture. Other: Right supraorbital soft tissue laceration and contusion. CT MAXILLOFACIAL FINDINGS Osseous: Intact maxillofacial bones. Orbits: The orbits appear intact. No retrobulbar hemorrhage or hematoma. No lens detachment. Intact globes. Sinuses: Chronic left maxillary sinusitis with circumferential mucosal thickening and inspissated mucus noted within. No air-fluid levels. Soft  tissues: Right periorbital soft tissue swelling with supraorbital laceration. CT CERVICAL SPINE FINDINGS Alignment: Straightening of cervical lordosis. Intact craniocervical relationship and atlantodental interval. Minimal grade 1 anterolisthesis of C2 on C3. Skull base and vertebrae: No acute fracture. No primary bone lesion or focal pathologic process. Soft tissues and spinal canal: No prevertebral soft tissue swelling or visible canal hematoma. Disc levels: Disc space flattening at all levels of the cervical spine. Large central to right central disc herniation at C3-4 impressing upon the adjacent cord and contributing to moderate central canal stenosis. Small posterior osteophytes at C6-7 slightly impressing upon the adjacent thecal sac. Mild bilateral foraminal encroachment at this level from uncinate spurring. Upper chest: Centrilobular emphysema of the included lung apices. Aortic atherosclerosis and atherosclerosis of the origins of the great vessels. Extracranial carotid arteriosclerosis is noted bilaterally. Other: None IMPRESSION: 1. No acute intracranial abnormality. Chronic minimal small vessel ischemia and right caudate lacunar infarct. 2. Right supraorbital soft tissue swelling with laceration. No acute maxillofacial fracture. 3. Chronic left maxillary sinus mucosal thickening. 4. No acute cervical spine fracture. 5. Central to right central disc herniation at C3-4 contributing to moderate central canal stenosis. 6. Diffuse cervical spondylosis as above. 7. Centrilobular emphysema at the apices. Electronically Signed   By: Ashley Royalty M.D.   On: 08/22/2017 20:26    Procedures Procedures (including critical care time)  Medications Ordered in ED Medications - No data to display   Initial Impression / Assessment and Plan / ED Course  I have reviewed the triage vital signs and the nursing notes.  Pertinent imaging results that were available during my care of the patient were reviewed by me  and considered in my medical decision making (see chart for details).    Pt well appearing and comfortable on exam. Normal neuro exam, bleeding controlled.  Once I removed the clot on his eyebrow, I found that there were no deep lacerations, rather there was an abrasion that appears to be from his glasses which were broken in the fall.  Applied bacitracin and discussed wound care instructions.  CT of head, C-spine, and face were negative for serious injury.  I have extensively reviewed return precautions with the patient and his wife.  They voiced understanding.  Final Clinical Impressions(s) / ED Diagnoses   Final diagnoses:  Injury of head, initial encounter  Fall in home, initial encounter  Abrasion of periorbital region of face, initial encounter    ED Discharge Orders    None       Little, Wenda Overland, MD 08/23/17 [phone]

## 2017-08-24 ENCOUNTER — Other Ambulatory Visit (INDEPENDENT_AMBULATORY_CARE_PROVIDER_SITE_OTHER): Payer: Medicare Other

## 2017-08-24 DIAGNOSIS — E118 Type 2 diabetes mellitus with unspecified complications: Secondary | ICD-10-CM

## 2017-08-24 LAB — COMPREHENSIVE METABOLIC PANEL
ALK PHOS: 150 U/L — AB (ref 39–117)
ALT: 15 U/L (ref 0–53)
AST: 21 U/L (ref 0–37)
Albumin: 3.5 g/dL (ref 3.5–5.2)
BILIRUBIN TOTAL: 1.9 mg/dL — AB (ref 0.2–1.2)
BUN: 30 mg/dL — AB (ref 6–23)
CO2: 30 meq/L (ref 19–32)
Calcium: 8.9 mg/dL (ref 8.4–10.5)
Chloride: 99 mEq/L (ref 96–112)
Creatinine, Ser: 1.21 mg/dL (ref 0.40–1.50)
GFR: 61.27 mL/min (ref >60.00)
GLUCOSE: 101 mg/dL — AB (ref 70–99)
Potassium: 3.8 mEq/L (ref 3.5–5.1)
SODIUM: 139 meq/L (ref 135–145)
TOTAL PROTEIN: 6.5 g/dL (ref 6.0–8.3)

## 2017-08-24 LAB — LIPID PANEL
CHOL/HDL RATIO: 2
Cholesterol: 104 mg/dL (ref 0–200)
HDL: 50.8 mg/dL (ref >39.00)
LDL Cholesterol: 40 mg/dL (ref 0–99)
NONHDL: 53.36
TRIGLYCERIDES: 68 mg/dL (ref 0.0–149.0)
VLDL: 13.6 mg/dL (ref 0.0–40.0)

## 2017-08-24 LAB — HEMOGLOBIN A1C: Hgb A1c MFr Bld: 5.5 % (ref 4.6–6.5)

## 2017-08-28 ENCOUNTER — Ambulatory Visit (INDEPENDENT_AMBULATORY_CARE_PROVIDER_SITE_OTHER): Payer: Medicare Other | Admitting: Endocrinology

## 2017-08-28 ENCOUNTER — Encounter: Payer: Self-pay | Admitting: Endocrinology

## 2017-08-28 VITALS — BP 108/60 | HR 77 | Ht 73.0 in | Wt 262.8 lb

## 2017-08-28 DIAGNOSIS — I25708 Atherosclerosis of coronary artery bypass graft(s), unspecified, with other forms of angina pectoris: Secondary | ICD-10-CM

## 2017-08-28 DIAGNOSIS — E119 Type 2 diabetes mellitus without complications: Secondary | ICD-10-CM

## 2017-08-28 NOTE — Patient Instructions (Signed)
Check blood sugars on waking up  1-2/7  Also check blood sugars about 2 hours after a meal and do this after different meals by rotation  Recommended blood sugar levels on waking up is 80-120 and about 2 hours after meal is 130-160  Please bring your blood sugar monitor to each visit, thank you

## 2017-08-28 NOTE — Progress Notes (Signed)
Patient ID: [name], male   DOB: 08/28/1935, 82 y.o.   MRN: [id number]   Reason for Appointment: Diabetes follow-up   History of Present Illness   Diagnosis: Type 2 DIABETES MELITUS date of onset: 07/2010       Oral hypoglycemic drugs: metformin 1500 mg daily       Side effects from medications: None  He was started on metformin when his baseline A1c was 6.9% and he had significant obesity He was also sent for diabetes education. Since then A1c had been consistently upper normal  Recent history:  Oral hypoglycemic drugs: Metformin ER 750 mg, 2 daily  His A1c again is excellent at 5.5, previously ranging from 5.4 up to 5.8%  He was last seen in 1/19  Current management and blood sugars  He has had consistently good control at home with blood sugars ranging from 74 up to 118 and doing readings occasionally and mostly before noon  Does not have seem to have high readings after meals but taking this infrequently  Is not able to do much exercise because of various medical issues and being on oxygen  He is continuing to take 2 tablets of metformin, previously with higher doses had readings as low as 61 and some loose stools  He is consistent with his diet and is aware of cutting back on high fat foods and usually eating small portions Weight is about the same    Monitors blood glucose:  every other day, none since November  Overall median 102, readings as above   Meals: breakfast:occasional cereal otherwise muffin and peanut butter  Physical activity: exercise: walking more recently             Dietician visit: Most recent:2012          Last eye exam: annual    Wt Readings from Last 3 Encounters:  08/28/17 262 lb 12.8 oz (119.2 kg)  08/13/17 255 lb (115.7 kg)  08/01/17 250 lb 8 oz (113.6 kg)   Lab Results  Component Value Date   HGBA1C 5.5 08/24/2017   HGBA1C 5.6 02/21/2017   HGBA1C 5.7 08/21/2016   Lab Results  Component Value Date   MICROALBUR  41.6 (H) 02/21/2017   LDLCALC 40 08/24/2017   CREATININE 1.21 08/24/2017    LABS:  Lab on 08/24/2017  Component Date Value Ref Range Status  . Cholesterol 08/24/2017 104  0 - 200 mg/dL Final   ATP III Classification       Desirable:  < 200 mg/dL               Borderline High:  200 - 239 mg/dL          High:  > = 240 mg/dL  . Triglycerides 08/24/2017 68.0  0.0 - 149.0 mg/dL Final   Normal:  <150 mg/dLBorderline High:  150 - 199 mg/dL  . HDL 08/24/2017 50.80  >39.00 mg/dL Final  . VLDL 08/24/2017 13.6  0.0 - 40.0 mg/dL Final  . LDL Cholesterol 08/24/2017 40  0 - 99 mg/dL Final  . Total CHOL/HDL Ratio 08/24/2017 2   Final                  Men          Women1/2 Average Risk     3.4          3.3Average Risk          5.0  4.42X Average Risk          9.6          7.13X Average Risk          15.0          11.0                      . NonHDL 08/24/2017 53.36   Final   NOTE:  Non-HDL goal should be 30 mg/dL higher than patient's LDL goal (i.e. LDL goal of < 70 mg/dL, would have non-HDL goal of < 100 mg/dL)  . Sodium 08/24/2017 139  135 - 145 mEq/L Final  . Potassium 08/24/2017 3.8  3.5 - 5.1 mEq/L Final  . Chloride 08/24/2017 99  96 - 112 mEq/L Final  . CO2 08/24/2017 30  19 - 32 mEq/L Final  . Glucose, Bld 08/24/2017 101* 70 - 99 mg/dL Final  . BUN 08/24/2017 30* 6 - 23 mg/dL Final  . Creatinine, Ser 08/24/2017 1.21  0.40 - 1.50 mg/dL Final  . Total Bilirubin 08/24/2017 1.9* 0.2 - 1.2 mg/dL Final  . Alkaline Phosphatase 08/24/2017 150* 39 - 117 U/L Final  . AST 08/24/2017 21  0 - 37 U/L Final  . ALT 08/24/2017 15  0 - 53 U/L Final  . Total Protein 08/24/2017 6.5  6.0 - 8.3 g/dL Final  . Albumin 08/24/2017 3.5  3.5 - 5.2 g/dL Final  . Calcium 08/24/2017 8.9  8.4 - 10.5 mg/dL Final  . GFR 08/24/2017 61.27  >60.00 mL/min Final  . Hgb A1c MFr Bld 08/24/2017 5.5  4.6 - 6.5 % Final   Glycemic Control Guidelines for People with Diabetes:Non Diabetic:  <6%Goal of Therapy: <7%Additional  Action Suggested:  >8%     Allergies as of 08/28/2017      Reactions   Rifampin Rash   May have been caused by Vancomycin or Rifampin (??)   Vancomycin Rash   May have been caused by Vancomycin or Rifampin (??)      Medication List        Accurate as of 08/28/17  9:08 AM. Always use your most recent med list.          acetaminophen 500 MG tablet Commonly known as:  TYLENOL Take 500-1,000 mg by mouth daily as needed (for pain).   budesonide-formoterol 160-4.5 MCG/ACT inhaler Commonly known as:  SYMBICORT Inhale 2 puffs into the lungs 2 (two) times daily.   carvedilol 25 MG tablet Commonly known as:  COREG Take 1 tablet (25 mg total) by mouth 2 (two) times daily with a meal.   dextromethorphan-guaiFENesin 30-600 MG 12hr tablet Commonly known as:  MUCINEX DM Take 1 tablet by mouth 2 (two) times daily.   ezetimibe-simvastatin 10-20 MG tablet Commonly known as:  VYTORIN Take 1 tablet by mouth at bedtime.   fluocinonide cream 0.05 % Commonly known as:  LIDEX Apply 1 application topically 2 (two) times daily.   folic acid 1 MG tablet Commonly known as:  FOLVITE Take 1 tablet (1 mg total) by mouth daily.   glucose blood test strip Commonly known as:  ONE TOUCH ULTRA TEST USE AS DIRECTED TO TEST BLOOD GLUCOSE  EVERY OTHER DAY DX: 250.00   glucose blood test strip Commonly known as:  ONETOUCH VERIO Use as instructed to check blood sugar once daily. Dx Code E11.8   metFORMIN 750 MG 24 hr tablet Commonly known as:  GLUCOPHAGE-XR TAKE 2 TABLETS (1,500 MG TOTAL) BY MOUTH  DAILY.   NON FORMULARY BiPAP: At bedtime   NON FORMULARY ONE TOUCH ULTRA TEST STRIPS  AND LANCETS   ONETOUCH DELICA LANCETS FINE Misc Use to obtain a blood specimen every day Dx code E11.8   PROAIR HFA 108 (90 Base) MCG/ACT inhaler Generic drug:  albuterol Inhale 2 puffs into the lungs every 4 (four) hours as needed (for chest congestion).   torsemide 20 MG tablet Commonly known as:   DEMADEX Take 2 tablets (40 mg total) by mouth daily.   VASCULERA Tabs Take 1 tablet by mouth daily.   Vitamin B-12 3000 MCG Subl Take 3,000 mcg by mouth daily.   Vitamin D3 2000 units capsule Take 1 capsule (2,000 Units total) by mouth daily.   warfarin 5 MG tablet Commonly known as:  COUMADIN Take as directed by the anticoagulation clinic. If you are unsure how to take this medication, talk to your nurse or doctor. Original instructions:  TAKE AS DIRECTED BY COUMADIN CLINIC       Allergies:  Allergies  Allergen Reactions  . Rifampin Rash    May have been caused by Vancomycin or Rifampin (??)  . Vancomycin Rash    May have been caused by Vancomycin or Rifampin (??)    Past Medical History:  Diagnosis Date  . Aortic stenosis   . Carotid artery disease ([place]) 1994   s/p left carotid endarerectomy   . CHF with unknown LVEF ([facility])    47 %  . Chronic atrial fibrillation ([place])   . Hx of CABG   . Hypertension   . Left ventricular dysfunction 2007   LVEF 47%   . OSA (obstructive sleep apnea)   . Rheumatic fever   . Subclavian bypass stenosis ([place])   . Vitamin B12 deficiency     Past Surgical History:  Procedure Laterality Date  . [place]   replaced due to aortic stenosis, [employer] mechanical prostesis  . CAROTID ENDARTERECTOMY Left 1997   subclavian bypass Done in [place]  . CORONARY ARTERY BYPASS GRAFT  1994   w SVG to RCA and SVG to circumflex  . heart bypass     Done in [place]    Family History  Problem Relation Age of Onset  . Cancer Father        lung   . Hypertension Mother     Social History:  reports that he quit smoking about 25 years ago. His smoking use included cigarettes. He has a 30.00 pack-year smoking history. He has never used smokeless tobacco. He reports that he drinks alcohol. He reports that he does not use drugs.  Review of Systems    HYPERTENSION:  he is taking Coreg and recently not on enalapril, prescribed by  his cardiologist   BP Readings from Last 3 Encounters:  08/28/17 108/60  08/23/17 (!) 111/52  08/13/17 114/66   He continues to be on diuretics with somewhat difficult control of edema  Lab Results  Component Value Date   CREATININE 1.21 08/24/2017   BUN 30 (H) 08/24/2017   NA 139 08/24/2017   K 3.8 08/24/2017   CL 99 08/24/2017   CO2 30 08/24/2017     HYPERLIPIDEMIA:  Hypercholesterolemia: LDL adequately treated with Vytorin and followed by PCP    Lab Results  Component Value Date   CHOL 104 08/24/2017   HDL 50.80 08/24/2017   LDLCALC 40 08/24/2017   TRIG 68.0 08/24/2017   CHOLHDL 2 08/24/2017     Last foot  exam in 1/19 showing peripheral vascular disease and only mild neuropathy   Examination:   BP 108/60 (BP Location: Left Arm, Patient Position: Sitting, Cuff Size: Normal)   Pulse 77   Ht 6\' 1"  (1.854 m)   Wt 262 lb 12.8 oz (119.2 kg)   SpO2 94%   BMI 34.67 kg/m   Body mass index is 34.67 kg/m.    Assesment/PLAN:   Diabetes type 2, Mild with good control  He is here for periodic follow-up and his control is excellent with 1500 mg of metformin ER He has had no progression of his diabetes Blood sugars are near normal at home most of the time His diet is fairly good  He can continue to follow-up in about 6 months and let us know if his blood sugars start going higher  LIPIDS: Treated with Vytorin with control of LDL below 70   Wardell Pokorski 08/28/2017, 9:08 AM   Note: This office note was prepared with Dragon voice recognition system technology. Any transcriptional errors that result from this process are unintentional.

## 2017-08-30 ENCOUNTER — Ambulatory Visit (INDEPENDENT_AMBULATORY_CARE_PROVIDER_SITE_OTHER): Payer: Medicare Other

## 2017-08-30 DIAGNOSIS — I4891 Unspecified atrial fibrillation: Secondary | ICD-10-CM | POA: Diagnosis not present

## 2017-08-30 DIAGNOSIS — Z952 Presence of prosthetic heart valve: Secondary | ICD-10-CM | POA: Diagnosis not present

## 2017-08-30 DIAGNOSIS — Z5181 Encounter for therapeutic drug level monitoring: Secondary | ICD-10-CM | POA: Diagnosis not present

## 2017-08-30 LAB — POCT INR: INR: 1.8 — AB (ref 2.0–3.0)

## 2017-08-30 NOTE — Patient Instructions (Signed)
Description   Take 1.5 tablets today, then start taking 1 tablet daily except 1/2 tablet each Sundays and Wednesdays.  Repeat INR in 2 weeks.  Call with any new medications or procedures [phone]

## 2017-09-11 ENCOUNTER — Other Ambulatory Visit (INDEPENDENT_AMBULATORY_CARE_PROVIDER_SITE_OTHER): Payer: Medicare Other

## 2017-09-11 DIAGNOSIS — I5032 Chronic diastolic (congestive) heart failure: Secondary | ICD-10-CM | POA: Diagnosis not present

## 2017-09-11 LAB — CBC WITH DIFFERENTIAL/PLATELET
BASOS ABS: 0 10*3/uL (ref 0.0–0.1)
Basophils Relative: 0.7 % (ref 0.0–3.0)
EOS ABS: 0 10*3/uL (ref 0.0–0.7)
Eosinophils Relative: 1 % (ref 0.0–5.0)
HEMATOCRIT: 29.8 % — AB (ref 39.0–52.0)
HEMOGLOBIN: 10 g/dL — AB (ref 13.0–17.0)
LYMPHS PCT: 17.1 % (ref 12.0–46.0)
Lymphs Abs: 0.7 10*3/uL (ref 0.7–4.0)
MCHC: 33.5 g/dL (ref 30.0–36.0)
MCV: 106.7 fl — ABNORMAL HIGH (ref 78.0–100.0)
Monocytes Absolute: 0.5 10*3/uL (ref 0.1–1.0)
Monocytes Relative: 11.6 % (ref 3.0–12.0)
Neutro Abs: 2.8 10*3/uL (ref 1.4–7.7)
Neutrophils Relative %: 69.6 % (ref 43.0–77.0)
Platelets: 93 10*3/uL — ABNORMAL LOW (ref 150.0–400.0)
RBC: 2.79 Mil/uL — AB (ref 4.22–5.81)
RDW: 18.1 % — ABNORMAL HIGH (ref 11.5–15.5)
WBC: 4 10*3/uL (ref 4.0–10.5)

## 2017-09-11 LAB — BASIC METABOLIC PANEL
BUN: 33 mg/dL — AB (ref 6–23)
CHLORIDE: 101 meq/L (ref 96–112)
CO2: 30 mEq/L (ref 19–32)
Calcium: 9.1 mg/dL (ref 8.4–10.5)
Creatinine, Ser: 1.34 mg/dL (ref 0.40–1.50)
GFR: 54.46 mL/min — ABNORMAL LOW (ref >60.00)
GLUCOSE: 102 mg/dL — AB (ref 70–99)
POTASSIUM: 3.7 meq/L (ref 3.5–5.1)
Sodium: 140 mEq/L (ref 135–145)

## 2017-09-12 ENCOUNTER — Ambulatory Visit (INDEPENDENT_AMBULATORY_CARE_PROVIDER_SITE_OTHER): Payer: Medicare Other | Admitting: *Deleted

## 2017-09-12 DIAGNOSIS — Z5181 Encounter for therapeutic drug level monitoring: Secondary | ICD-10-CM

## 2017-09-12 DIAGNOSIS — Z952 Presence of prosthetic heart valve: Secondary | ICD-10-CM | POA: Diagnosis not present

## 2017-09-12 DIAGNOSIS — I4891 Unspecified atrial fibrillation: Secondary | ICD-10-CM

## 2017-09-12 LAB — POCT INR: INR: 2.2 (ref 2.0–3.0)

## 2017-09-12 NOTE — Patient Instructions (Signed)
Description   Take 1 tablet today, then start taking 1 tablet daily except 1/2 tablet on Wednesdays.  Repeat INR in 2 weeks.  Call with any new medications or procedures [phone]

## 2017-09-13 ENCOUNTER — Encounter: Payer: Self-pay | Admitting: Internal Medicine

## 2017-09-13 ENCOUNTER — Ambulatory Visit (INDEPENDENT_AMBULATORY_CARE_PROVIDER_SITE_OTHER): Payer: Medicare Other | Admitting: Internal Medicine

## 2017-09-13 VITALS — BP 110/64 | HR 84 | Temp 98.0°F | Ht 73.0 in | Wt 277.0 lb

## 2017-09-13 DIAGNOSIS — E118 Type 2 diabetes mellitus with unspecified complications: Secondary | ICD-10-CM | POA: Diagnosis not present

## 2017-09-13 DIAGNOSIS — I25708 Atherosclerosis of coronary artery bypass graft(s), unspecified, with other forms of angina pectoris: Secondary | ICD-10-CM | POA: Diagnosis not present

## 2017-09-13 DIAGNOSIS — R0902 Hypoxemia: Secondary | ICD-10-CM

## 2017-09-13 DIAGNOSIS — R609 Edema, unspecified: Secondary | ICD-10-CM | POA: Diagnosis not present

## 2017-09-13 DIAGNOSIS — I10 Essential (primary) hypertension: Secondary | ICD-10-CM

## 2017-09-13 MED ORDER — CEPHALEXIN 500 MG PO CAPS: 1000.0000 mg | ORAL_CAPSULE | Freq: Two times a day (BID) | ORAL | 1 refills | Status: DC

## 2017-09-13 MED ORDER — TORSEMIDE 100 MG PO TABS: 100.0000 mg | ORAL_TABLET | Freq: Every day | ORAL | 3 refills | Status: DC

## 2017-09-13 NOTE — Assessment & Plan Note (Signed)
Demadex - dose increased

## 2017-09-13 NOTE — Patient Instructions (Signed)

## 2017-09-13 NOTE — Assessment & Plan Note (Signed)
On O2 1 l/min [place]

## 2017-09-13 NOTE — Progress Notes (Signed)
Subjective:  Patient ID: [name], male    DOB: [birth year]  Age: 82 y.o. MRN: [id number]  CC: No chief complaint on file.   HPI Thamas [place] presents for LE edema: redness and swelling was helped w/Keflex  Outpatient Medications Prior to Visit  Medication Sig Dispense Refill  . acetaminophen (TYLENOL) 500 MG tablet Take 500-1,000 mg by mouth daily as needed (for pain).     . carvedilol (COREG) 25 MG tablet Take 1 tablet (25 mg total) by mouth 2 (two) times daily with a meal. 180 tablet 3  . Cholecalciferol (VITAMIN D3) 2000 units capsule Take 1 capsule (2,000 Units total) by mouth daily. 100 capsule 3  . Cyanocobalamin (VITAMIN B-12) 3000 MCG SUBL Take 3,000 mcg by mouth daily.     Marland Kitchen dextromethorphan-guaiFENesin (MUCINEX DM) 30-600 MG 12hr tablet Take 1 tablet by mouth 2 (two) times daily. 20 tablet 0  . Dietary Management Product (VASCULERA) TABS Take 1 tablet by mouth daily. 90 tablet 3  . ezetimibe-simvastatin (VYTORIN) 10-20 MG tablet Take 1 tablet by mouth at bedtime. 90 tablet 3  . fluocinonide cream (LIDEX) 5.17 % Apply 1 application topically 2 (two) times daily. (Patient taking differently: Apply 1 application topically 2 (two) times daily as needed (to affected areas of legs). ) 30 g 3  . folic acid (FOLVITE) 1 MG tablet Take 1 tablet (1 mg total) by mouth daily.    Marland Kitchen glucose blood (ONE TOUCH ULTRA TEST) test strip USE AS DIRECTED TO TEST BLOOD GLUCOSE  EVERY OTHER DAY DX: 250.00 50 each 2  . glucose blood (ONETOUCH VERIO) test strip Use as instructed to check blood sugar once daily. Dx Code E11.8 100 each 3  . metFORMIN (GLUCOPHAGE-XR) 750 MG 24 hr tablet TAKE 2 TABLETS (1,500 MG TOTAL) BY MOUTH DAILY. 180 tablet 2  . NON FORMULARY BiPAP: At bedtime    . NON FORMULARY ONE TOUCH ULTRA TEST STRIPS  AND LANCETS    . ONETOUCH DELICA LANCETS FINE MISC Use to obtain a blood specimen every day Dx code E11.8 100 each 1  . PROAIR HFA 108 (90 Base) MCG/ACT inhaler Inhale 2 puffs  into the lungs every 4 (four) hours as needed (for chest congestion). 18 g 1  . torsemide (DEMADEX) 20 MG tablet Take 2 tablets (40 mg total) by mouth daily. 180 tablet 3  . warfarin (COUMADIN) 5 MG tablet TAKE AS DIRECTED BY COUMADIN CLINIC (Patient taking differently: Take 2.5-5 mg by mouth See admin instructions. Take 2.5 mg by mouth in the evening on Sun/Wed/Fri and 5 mg on Mon/Tues/Thurs/Sat) 90 tablet 1  . budesonide-formoterol (SYMBICORT) 160-4.5 MCG/ACT inhaler Inhale 2 puffs into the lungs 2 (two) times daily. 1 Inhaler 0   No facility-administered medications prior to visit.     ROS: Review of Systems  Constitutional: Negative for appetite change, fatigue and unexpected weight change.  HENT: Negative for congestion, nosebleeds, sneezing, sore throat and trouble swallowing.   Eyes: Negative for itching and visual disturbance.  Respiratory: Negative for cough.   Cardiovascular: Negative for chest pain, palpitations and leg swelling.  Gastrointestinal: Negative for abdominal distention, blood in stool, diarrhea and nausea.  Genitourinary: Negative for frequency and hematuria.  Musculoskeletal: Negative for back pain, gait problem, joint swelling and neck pain.  Skin: Positive for color change. Negative for rash.  Neurological: Positive for weakness. Negative for dizziness, tremors and speech difficulty.  Psychiatric/Behavioral: Negative for agitation, dysphoric mood and sleep disturbance. The patient is not nervous/anxious.  Objective:  BP 110/64 (BP Location: Right Arm, Patient Position: Sitting, Cuff Size: Large)   Pulse 84   Temp 98 F (36.7 C) (Oral)   Ht 6\' 1"  (1.854 m)   Wt 277 lb (125.6 kg)   SpO2 98% Comment: 1 liter of o2  BMI 36.55 kg/m   BP Readings from Last 3 Encounters:  09/13/17 110/64  08/28/17 108/60  08/23/17 (!) 111/52    Wt Readings from Last 3 Encounters:  09/13/17 277 lb (125.6 kg)  08/28/17 262 lb 12.8 oz (119.2 kg)  08/13/17 255 lb (115.7  kg)    Physical Exam  Constitutional: He is oriented to person, place, and time. He appears well-developed. No distress.  NAD  HENT:  Mouth/Throat: Oropharynx is clear and moist.  Eyes: Pupils are equal, round, and reactive to light. Conjunctivae are normal.  Neck: Normal range of motion. No JVD present. No thyromegaly present.  Cardiovascular: Normal rate, regular rhythm, normal heart sounds and intact distal pulses. Exam reveals no gallop and no friction rub.  No murmur heard. Pulmonary/Chest: Effort normal and breath sounds normal. No respiratory distress. He has no wheezes. He has no rales. He exhibits no tenderness.  Abdominal: Soft. Bowel sounds are normal. He exhibits no distension and no mass. There is no tenderness. There is no rebound and no guarding.  Musculoskeletal: Normal range of motion. He exhibits edema. He exhibits no tenderness.  Lymphadenopathy:    He has no cervical adenopathy.  Neurological: He is alert and oriented to person, place, and time. He has normal reflexes. No cranial nerve deficit. He exhibits normal muscle tone. He displays a negative Romberg sign. Coordination and gait normal.  Skin: Skin is warm and dry. No rash noted.  Psychiatric: He has a normal mood and affect. His behavior is normal. Judgment and thought content normal.  edema LEs 2-3+ Hyperpigmentation B  Ct Head Wo Contrast  Result Date: 08/22/2017 CLINICAL DATA:  Patient tripped and fell over rug at home hitting head on the floor. Laceration to right eyebrow. Patient on warfarin. EXAM: CT HEAD WITHOUT CONTRAST CT MAXILLOFACIAL WITHOUT CONTRAST CT CERVICAL SPINE WITHOUT CONTRAST TECHNIQUE: Multidetector CT imaging of the head, cervical spine, and maxillofacial structures were performed using the standard protocol without intravenous contrast. Multiplanar CT image reconstructions of the cervical spine and maxillofacial structures were also generated. COMPARISON:  None. FINDINGS: CT HEAD FINDINGS  Brain: Mild age related involutional changes of the brain. No acute intracranial hemorrhage, edema or midline shift. Remote appearing right caudate head lacunar infarct. Chronic appearing minimal small vessel ischemia of periventricular white matter. No intra-axial mass nor extra-axial fluid collections. Vascular: No hyperdense vessel sign. Moderate atherosclerosis of the carotid siphons bilaterally. Skull: No skull fracture. Other: Right supraorbital soft tissue laceration and contusion. CT MAXILLOFACIAL FINDINGS Osseous: Intact maxillofacial bones. Orbits: The orbits appear intact. No retrobulbar hemorrhage or hematoma. No lens detachment. Intact globes. Sinuses: Chronic left maxillary sinusitis with circumferential mucosal thickening and inspissated mucus noted within. No air-fluid levels. Soft tissues: Right periorbital soft tissue swelling with supraorbital laceration. CT CERVICAL SPINE FINDINGS Alignment: Straightening of cervical lordosis. Intact craniocervical relationship and atlantodental interval. Minimal grade 1 anterolisthesis of C2 on C3. Skull base and vertebrae: No acute fracture. No primary bone lesion or focal pathologic process. Soft tissues and spinal canal: No prevertebral soft tissue swelling or visible canal hematoma. Disc levels: Disc space flattening at all levels of the cervical spine. Large central to right central disc herniation at C3-4 impressing upon the  adjacent cord and contributing to moderate central canal stenosis. Small posterior osteophytes at C6-7 slightly impressing upon the adjacent thecal sac. Mild bilateral foraminal encroachment at this level from uncinate spurring. Upper chest: Centrilobular emphysema of the included lung apices. Aortic atherosclerosis and atherosclerosis of the origins of the great vessels. Extracranial carotid arteriosclerosis is noted bilaterally. Other: None IMPRESSION: 1. No acute intracranial abnormality. Chronic minimal small vessel ischemia and  right caudate lacunar infarct. 2. Right supraorbital soft tissue swelling with laceration. No acute maxillofacial fracture. 3. Chronic left maxillary sinus mucosal thickening. 4. No acute cervical spine fracture. 5. Central to right central disc herniation at C3-4 contributing to moderate central canal stenosis. 6. Diffuse cervical spondylosis as above. 7. Centrilobular emphysema at the apices. Electronically Signed   By: Ashley Royalty M.D.   On: 08/22/2017 20:26   Ct Cervical Spine Wo Contrast  Result Date: 08/22/2017 CLINICAL DATA:  Patient tripped and fell over rug at home hitting head on the floor. Laceration to right eyebrow. Patient on warfarin. EXAM: CT HEAD WITHOUT CONTRAST CT MAXILLOFACIAL WITHOUT CONTRAST CT CERVICAL SPINE WITHOUT CONTRAST TECHNIQUE: Multidetector CT imaging of the head, cervical spine, and maxillofacial structures were performed using the standard protocol without intravenous contrast. Multiplanar CT image reconstructions of the cervical spine and maxillofacial structures were also generated. COMPARISON:  None. FINDINGS: CT HEAD FINDINGS Brain: Mild age related involutional changes of the brain. No acute intracranial hemorrhage, edema or midline shift. Remote appearing right caudate head lacunar infarct. Chronic appearing minimal small vessel ischemia of periventricular white matter. No intra-axial mass nor extra-axial fluid collections. Vascular: No hyperdense vessel sign. Moderate atherosclerosis of the carotid siphons bilaterally. Skull: No skull fracture. Other: Right supraorbital soft tissue laceration and contusion. CT MAXILLOFACIAL FINDINGS Osseous: Intact maxillofacial bones. Orbits: The orbits appear intact. No retrobulbar hemorrhage or hematoma. No lens detachment. Intact globes. Sinuses: Chronic left maxillary sinusitis with circumferential mucosal thickening and inspissated mucus noted within. No air-fluid levels. Soft tissues: Right periorbital soft tissue swelling with  supraorbital laceration. CT CERVICAL SPINE FINDINGS Alignment: Straightening of cervical lordosis. Intact craniocervical relationship and atlantodental interval. Minimal grade 1 anterolisthesis of C2 on C3. Skull base and vertebrae: No acute fracture. No primary bone lesion or focal pathologic process. Soft tissues and spinal canal: No prevertebral soft tissue swelling or visible canal hematoma. Disc levels: Disc space flattening at all levels of the cervical spine. Large central to right central disc herniation at C3-4 impressing upon the adjacent cord and contributing to moderate central canal stenosis. Small posterior osteophytes at C6-7 slightly impressing upon the adjacent thecal sac. Mild bilateral foraminal encroachment at this level from uncinate spurring. Upper chest: Centrilobular emphysema of the included lung apices. Aortic atherosclerosis and atherosclerosis of the origins of the great vessels. Extracranial carotid arteriosclerosis is noted bilaterally. Other: None IMPRESSION: 1. No acute intracranial abnormality. Chronic minimal small vessel ischemia and right caudate lacunar infarct. 2. Right supraorbital soft tissue swelling with laceration. No acute maxillofacial fracture. 3. Chronic left maxillary sinus mucosal thickening. 4. No acute cervical spine fracture. 5. Central to right central disc herniation at C3-4 contributing to moderate central canal stenosis. 6. Diffuse cervical spondylosis as above. 7. Centrilobular emphysema at the apices. Electronically Signed   By: Ashley Royalty M.D.   On: 08/22/2017 20:26   Ct Maxillofacial Wo Contrast  Result Date: 08/22/2017 CLINICAL DATA:  Patient tripped and fell over rug at home hitting head on the floor. Laceration to right eyebrow. Patient on warfarin. EXAM: CT HEAD  WITHOUT CONTRAST CT MAXILLOFACIAL WITHOUT CONTRAST CT CERVICAL SPINE WITHOUT CONTRAST TECHNIQUE: Multidetector CT imaging of the head, cervical spine, and maxillofacial structures were  performed using the standard protocol without intravenous contrast. Multiplanar CT image reconstructions of the cervical spine and maxillofacial structures were also generated. COMPARISON:  None. FINDINGS: CT HEAD FINDINGS Brain: Mild age related involutional changes of the brain. No acute intracranial hemorrhage, edema or midline shift. Remote appearing right caudate head lacunar infarct. Chronic appearing minimal small vessel ischemia of periventricular white matter. No intra-axial mass nor extra-axial fluid collections. Vascular: No hyperdense vessel sign. Moderate atherosclerosis of the carotid siphons bilaterally. Skull: No skull fracture. Other: Right supraorbital soft tissue laceration and contusion. CT MAXILLOFACIAL FINDINGS Osseous: Intact maxillofacial bones. Orbits: The orbits appear intact. No retrobulbar hemorrhage or hematoma. No lens detachment. Intact globes. Sinuses: Chronic left maxillary sinusitis with circumferential mucosal thickening and inspissated mucus noted within. No air-fluid levels. Soft tissues: Right periorbital soft tissue swelling with supraorbital laceration. CT CERVICAL SPINE FINDINGS Alignment: Straightening of cervical lordosis. Intact craniocervical relationship and atlantodental interval. Minimal grade 1 anterolisthesis of C2 on C3. Skull base and vertebrae: No acute fracture. No primary bone lesion or focal pathologic process. Soft tissues and spinal canal: No prevertebral soft tissue swelling or visible canal hematoma. Disc levels: Disc space flattening at all levels of the cervical spine. Large central to right central disc herniation at C3-4 impressing upon the adjacent cord and contributing to moderate central canal stenosis. Small posterior osteophytes at C6-7 slightly impressing upon the adjacent thecal sac. Mild bilateral foraminal encroachment at this level from uncinate spurring. Upper chest: Centrilobular emphysema of the included lung apices. Aortic atherosclerosis  and atherosclerosis of the origins of the great vessels. Extracranial carotid arteriosclerosis is noted bilaterally. Other: None IMPRESSION: 1. No acute intracranial abnormality. Chronic minimal small vessel ischemia and right caudate lacunar infarct. 2. Right supraorbital soft tissue swelling with laceration. No acute maxillofacial fracture. 3. Chronic left maxillary sinus mucosal thickening. 4. No acute cervical spine fracture. 5. Central to right central disc herniation at C3-4 contributing to moderate central canal stenosis. 6. Diffuse cervical spondylosis as above. 7. Centrilobular emphysema at the apices. Electronically Signed   By: Ashley Royalty M.D.   On: 08/22/2017 20:26    Assessment & Plan:   There are no diagnoses linked to this encounter.   No orders of the defined types were placed in this encounter.    Follow-up: No follow-ups on file.  Walker Kehr, MD

## 2017-09-13 NOTE — Assessment & Plan Note (Addendum)
Worse On O2 1 l/min [place] Demadex was increased Keflex po

## 2017-09-13 NOTE — Assessment & Plan Note (Signed)
Metformin 

## 2017-09-24 ENCOUNTER — Telehealth: Payer: Self-pay | Admitting: Internal Medicine

## 2017-09-24 NOTE — Telephone Encounter (Signed)
Copied from [place] [phone]. Topic: Quick Communication - See Telephone Encounter >> Sep 24, 2017  7:59 AM Synthia Innocent wrote: CRM for notification. See Telephone encounter for: 09/24/17. Requesting to speak with Dr Alain Marion CMA, swelling in legs are not going down that well, extending to stomach area. Wondering about med change, Dr Alain Marion mentioned at last visit.

## 2017-09-25 NOTE — Telephone Encounter (Signed)
Please advise 

## 2017-09-26 ENCOUNTER — Ambulatory Visit (INDEPENDENT_AMBULATORY_CARE_PROVIDER_SITE_OTHER): Payer: Medicare Other | Admitting: *Deleted

## 2017-09-26 DIAGNOSIS — I4891 Unspecified atrial fibrillation: Secondary | ICD-10-CM | POA: Diagnosis not present

## 2017-09-26 DIAGNOSIS — Z952 Presence of prosthetic heart valve: Secondary | ICD-10-CM

## 2017-09-26 DIAGNOSIS — Z5181 Encounter for therapeutic drug level monitoring: Secondary | ICD-10-CM | POA: Diagnosis not present

## 2017-09-26 LAB — POCT INR: INR: 3.6 — AB (ref 2.0–3.0)

## 2017-09-26 MED ORDER — BUMETANIDE 1 MG PO TABS: 1.0000 mg | ORAL_TABLET | Freq: Every day | ORAL | 3 refills | Status: DC

## 2017-09-26 NOTE — Telephone Encounter (Signed)
LM notifying pt

## 2017-09-26 NOTE — Patient Instructions (Signed)
Description   Tomorrow take 1/2 tablet then continue taking 1 tablet daily except 1/2 tablet on Wednesdays.  Repeat INR in 2 weeks.  Call with any new medications or procedures [phone]

## 2017-09-26 NOTE — Telephone Encounter (Signed)
We increased Torsemide to 100 mg/d - is he taking 100 mg/d? If yes - add Bumex 1 mg q am - rx emailed Thx

## 2017-09-26 NOTE — Telephone Encounter (Signed)
Pt. Given Dr. Judeen Hammans message. Will pick up the Bumex today and start it. Instructed to get his weight prior to starting and begin weighing himself everyday. Verbalizes understanding.

## 2017-10-03 ENCOUNTER — Ambulatory Visit: Payer: Self-pay

## 2017-10-03 MED ORDER — BUMETANIDE 1 MG PO TABS: 2.0000 mg | ORAL_TABLET | Freq: Every day | ORAL | 3 refills | Status: DC

## 2017-10-03 NOTE — Telephone Encounter (Signed)
Pt informed of below.  

## 2017-10-03 NOTE — Telephone Encounter (Signed)
Pls increase Bumex to 2 mg a day Torsemide 100 mg/d F/u w/Cardiology Go to ER if worse Thx

## 2017-10-03 NOTE — Telephone Encounter (Signed)
Pt. Reports he has noticed increased "fluid and swelling even with the new medicine - Bumex.Here at home my weight went up to 280." Having increased shortness of breath with minimal exertion. Wife reports she increased his 02 from 2L/min to 2.5. Denies any chest pain. Wants to know if the "fluid medications can be increased." Please advise pt. Contact number - [phone].  Answer Assessment - Initial Assessment Questions 1. RESPIRATORY STATUS: "Describe your breathing?" (e.g., wheezing, shortness of breath, unable to speak, severe coughing)      Shortness of breath 2. ONSET: "When did this breathing problem begin?"      This week 3. PATTERN "Does the difficult breathing come and go, or has it been constant since it started?"      With exertion 4. SEVERITY: "How bad is your breathing?" (e.g., mild, moderate, severe)    - MILD: No SOB at rest, mild SOB with walking, speaks normally in sentences, can lay down, no retractions, pulse < 100.    - MODERATE: SOB at rest, SOB with minimal exertion and prefers to sit, cannot lie down flat, speaks in phrases, mild retractions, audible wheezing, pulse 100-120.    - SEVERE: Very SOB at rest, speaks in single words, struggling to breathe, sitting hunched forward, retractions, pulse > 120      Moderate 5. RECURRENT SYMPTOM: "Have you had difficulty breathing before?" If so, ask: "When was the last time?" and "What happened that time?"      Yes 6. CARDIAC HISTORY: "Do you have any history of heart disease?" (e.g., heart attack, angina, bypass surgery, angioplasty)      Yes - Valve replacement 7. LUNG HISTORY: "Do you have any history of lung disease?"  (e.g., pulmonary embolus, asthma, emphysema)     COPD 8. CAUSE: "What do you think is causing the breathing problem?"      fLUID 9. OTHER SYMPTOMS: "Do you have any other symptoms? (e.g., dizziness, runny nose, cough, chest pain, fever)     No 10. PREGNANCY: "Is there any chance you are pregnant?" "When was  your last menstrual period?"       n/a 11. TRAVEL: "Have you traveled out of the country in the last month?" (e.g., travel history, exposures)       No  Protocols used: BREATHING DIFFICULTY-A-AH

## 2017-10-11 ENCOUNTER — Ambulatory Visit (INDEPENDENT_AMBULATORY_CARE_PROVIDER_SITE_OTHER): Payer: Medicare Other | Admitting: *Deleted

## 2017-10-11 DIAGNOSIS — Z952 Presence of prosthetic heart valve: Secondary | ICD-10-CM

## 2017-10-11 DIAGNOSIS — I4891 Unspecified atrial fibrillation: Secondary | ICD-10-CM | POA: Diagnosis not present

## 2017-10-11 DIAGNOSIS — Z5181 Encounter for therapeutic drug level monitoring: Secondary | ICD-10-CM

## 2017-10-11 LAB — POCT INR: INR: 3.8 — AB (ref 2.0–3.0)

## 2017-10-11 NOTE — Patient Instructions (Signed)
Description   Today only take 1/2 tablet, then start taking 1 tablet daily except 1/2 tablet on Mondays and Fridays.  Repeat INR in 2 weeks.  Call with any new medications or procedures [phone]

## 2017-10-15 ENCOUNTER — Encounter: Payer: Self-pay | Admitting: Cardiology

## 2017-10-15 ENCOUNTER — Other Ambulatory Visit (INDEPENDENT_AMBULATORY_CARE_PROVIDER_SITE_OTHER): Payer: Medicare Other

## 2017-10-15 ENCOUNTER — Ambulatory Visit (INDEPENDENT_AMBULATORY_CARE_PROVIDER_SITE_OTHER): Payer: Medicare Other | Admitting: Cardiology

## 2017-10-15 ENCOUNTER — Telehealth: Payer: Self-pay | Admitting: *Deleted

## 2017-10-15 ENCOUNTER — Other Ambulatory Visit: Payer: Self-pay | Admitting: Internal Medicine

## 2017-10-15 VITALS — BP 118/66 | HR 78 | Ht 73.0 in | Wt 299.0 lb

## 2017-10-15 DIAGNOSIS — I10 Essential (primary) hypertension: Secondary | ICD-10-CM

## 2017-10-15 DIAGNOSIS — E119 Type 2 diabetes mellitus without complications: Secondary | ICD-10-CM

## 2017-10-15 DIAGNOSIS — G4733 Obstructive sleep apnea (adult) (pediatric): Secondary | ICD-10-CM

## 2017-10-15 DIAGNOSIS — I25708 Atherosclerosis of coronary artery bypass graft(s), unspecified, with other forms of angina pectoris: Secondary | ICD-10-CM

## 2017-10-15 DIAGNOSIS — E669 Obesity, unspecified: Secondary | ICD-10-CM

## 2017-10-15 LAB — COMPREHENSIVE METABOLIC PANEL
ALK PHOS: 153 U/L — AB (ref 39–117)
ALT: 13 U/L (ref 0–53)
AST: 25 U/L (ref 0–37)
Albumin: 3.6 g/dL (ref 3.5–5.2)
BUN: 32 mg/dL — AB (ref 6–23)
CO2: 30 mEq/L (ref 19–32)
Calcium: 9.3 mg/dL (ref 8.4–10.5)
Chloride: 99 mEq/L (ref 96–112)
Creatinine, Ser: 1.4 mg/dL (ref 0.40–1.50)
GFR: 51.76 mL/min — AB (ref >60.00)
Glucose, Bld: 111 mg/dL — ABNORMAL HIGH (ref 70–99)
POTASSIUM: 3.9 meq/L (ref 3.5–5.1)
SODIUM: 142 meq/L (ref 135–145)
TOTAL PROTEIN: 6.6 g/dL (ref 6.0–8.3)
Total Bilirubin: 1.8 mg/dL — ABNORMAL HIGH (ref 0.2–1.2)

## 2017-10-15 LAB — MICROALBUMIN / CREATININE URINE RATIO
Creatinine,U: 72.6 mg/dL
MICROALB/CREAT RATIO: 8.9 mg/g (ref 0.0–30.0)
Microalb, Ur: 6.4 mg/dL — ABNORMAL HIGH (ref 0.0–1.9)

## 2017-10-15 LAB — HEMOGLOBIN A1C: HEMOGLOBIN A1C: 5.1 % (ref 4.6–6.5)

## 2017-10-15 NOTE — Telephone Encounter (Signed)
-----   Message from Claude Manges, [place] sent at 10/15/2017  8:34 AM EDT ----- Regarding: PT ORDER INFORMATION RESMED BIPAP ON  AUTO  IPAP MAX 18 CM H20  EPAP MIN 6 CM H20  PS 4CM H20  RESMED AIRFIT  N30 MASK WITH CHIN STRAP

## 2017-10-15 NOTE — Patient Instructions (Addendum)
Medication Instructions:   Your physician recommends that you continue on your current medications as directed. Please refer to the Current Medication list given to you today.   If you need a refill on your cardiac medications before your next appointment, please call your pharmacy.  Labwork: NONE ORDERED  TODAY    Testing/Procedures: NONE ORDERED  TODAY    Follow-Up: IN 10 WEEKS WITH DR TURNER   AS SCHEDULED WITH DR  Tamala Julian    Any Other Special Instructions Will Be Listed Below (If Applicable).

## 2017-10-15 NOTE — Progress Notes (Signed)
Cardiology Office Note:    Date:  10/15/2017   ID:  [name], DOB December 29, 1935, MRN [id number]  PCP:  Cassandria Anger, MD  Cardiologist:  [name], MD    Referring MD: Cassandria Anger, MD   Chief Complaint  Patient presents with  . Sleep Apnea  . Hypertension    History of Present Illness:    [name] is a 82 y.o. male with a hx of OSA on PAP, obesity and HTN.  He is almost with his BiPAP device.  He says he is having a change in add more water frequently at night..  He  feels the pressure is adequate.  He is having a lot of problems with his nasal mask leaking.  He does have a chinstrap at home but really has not been using that.  He says he still feels tired during the day but has to get up multiple times a night to urinate.   He denies any significant mouth or nasal dryness or nasal congestion.  According to his wife he is occasionally snoring.  He would like to get a new Pap device as well as mask because his mask is leaking and whistling a lot.  He is also complaining about continued lower extremity edema and increased abdominal girth despite the diuretics of Dr. Alain Marion and Dr. Tamala Julian have him on.    Past Medical History:  Diagnosis Date  . Aortic stenosis   . Carotid artery disease ([place]) 1994   s/p left carotid endarerectomy   . CHF with unknown LVEF ([facility])    47 %  . Chronic atrial fibrillation ([place])   . Hx of CABG   . Hypertension   . Left ventricular dysfunction 2007   LVEF 47%   . OSA (obstructive sleep apnea)   . Rheumatic fever   . Subclavian bypass stenosis ([place])   . Vitamin B12 deficiency     Past Surgical History:  Procedure Laterality Date  . [place]   replaced due to aortic stenosis, [employer] mechanical prostesis  . CAROTID ENDARTERECTOMY Left 1997   subclavian bypass Done in [place]  . CORONARY ARTERY BYPASS GRAFT  1994   w SVG to RCA and SVG to circumflex  . heart bypass     Done in [place]     Current Medications: Current Meds  Medication Sig  . acetaminophen (TYLENOL) 500 MG tablet Take 500-1,000 mg by mouth daily as needed (for pain).   . bumetanide (BUMEX) 1 MG tablet Take 2 tablets (2 mg total) by mouth daily.  . carvedilol (COREG) 25 MG tablet Take 1 tablet (25 mg total) by mouth 2 (two) times daily with a meal.  . cephALEXin (KEFLEX) 500 MG capsule Take 2 capsules (1,000 mg total) by mouth 2 (two) times daily.  . Cholecalciferol (VITAMIN D3) 2000 units capsule Take 1 capsule (2,000 Units total) by mouth daily.  . Cyanocobalamin (VITAMIN B-12) 3000 MCG SUBL Take 3,000 mcg by mouth daily.   Marland Kitchen dextromethorphan-guaiFENesin (MUCINEX DM) 30-600 MG 12hr tablet Take 1 tablet by mouth 2 (two) times daily.  . Dietary Management Product (VASCULERA) TABS Take 1 tablet by mouth daily.  Marland Kitchen ezetimibe-simvastatin (VYTORIN) 10-20 MG tablet Take 1 tablet by mouth at bedtime.  . fluocinonide cream (LIDEX) 7.82 % Apply 1 application topically 2 (two) times daily. (Patient taking differently: Apply 1 application topically 2 (two) times daily as needed (to affected areas of legs). )  . folic acid (  FOLVITE) 1 MG tablet Take 1 tablet (1 mg total) by mouth daily.  Marland Kitchen glucose blood (ONE TOUCH ULTRA TEST) test strip USE AS DIRECTED TO TEST BLOOD GLUCOSE  EVERY OTHER DAY DX: 250.00  . glucose blood (ONETOUCH VERIO) test strip Use as instructed to check blood sugar once daily. Dx Code E11.8  . metFORMIN (GLUCOPHAGE-XR) 750 MG 24 hr tablet TAKE 2 TABLETS (1,500 MG TOTAL) BY MOUTH DAILY.  . NON FORMULARY BiPAP: At bedtime  . NON FORMULARY ONE TOUCH ULTRA TEST STRIPS  AND LANCETS  . ONETOUCH DELICA LANCETS FINE MISC Use to obtain a blood specimen every day Dx code E11.8  . PROAIR HFA 108 (90 Base) MCG/ACT inhaler Inhale 2 puffs into the lungs every 4 (four) hours as needed (for chest congestion).  . torsemide (DEMADEX) 100 MG tablet Take 1 tablet (100 mg total) by mouth daily.  Marland Kitchen warfarin (COUMADIN) 5 MG  tablet TAKE AS DIRECTED BY COUMADIN CLINIC (Patient taking differently: Take 2.5-5 mg by mouth See admin instructions. Take 2.5 mg by mouth in the evening on Sun/Wed/Fri and 5 mg on Mon/Tues/Thurs/Sat)     Allergies:   Rifampin and Vancomycin   Social History   Socioeconomic History  . Marital status: Married    Spouse name: Not on file  . Number of children: Not on file  . Years of education: Not on file  . Highest education level: Not on file  Occupational History  . Not on file  Social Needs  . Financial resource strain: Not on file  . Food insecurity:    Worry: Not on file    Inability: Not on file  . Transportation needs:    Medical: Not on file    Non-medical: Not on file  Tobacco Use  . Smoking status: Former Smoker    Packs/day: 1.00    Years: 30.00    Pack years: 30.00    Types: Cigarettes    Last attempt to quit: 04/03/1992    Years since quitting: 25.5  . Smokeless tobacco: Never Used  Substance and Sexual Activity  . Alcohol use: Yes    Comment: 4-6 ounces of wine daily  . Drug use: No    Comment: Half a cup a day.  Marland Kitchen Sexual activity: Not on file  Lifestyle  . Physical activity:    Days per week: Not on file    Minutes per session: Not on file  . Stress: Not on file  Relationships  . Social connections:    Talks on phone: Not on file    Gets together: Not on file    Attends religious service: Not on file    Active member of club or organization: Not on file    Attends meetings of clubs or organizations: Not on file    Relationship status: Not on file  Other Topics Concern  . Not on file  Social History Narrative  . Not on file     Family History: The patient's family history includes Cancer in his father; Hypertension in his mother.  ROS:   Please see the history of present illness.    ROS  All other systems reviewed and negative.   EKGs/Labs/Other Studies Reviewed:    The following studies were reviewed today: PAP download  EKG:  EKG is  not ordered today.    Recent Labs: 07/10/2017: Pro B Natriuretic peptide (BNP) 411.0 08/01/2017: B Natriuretic Peptide 784.4 08/03/2017: Magnesium 1.9 08/24/2017: ALT 15 09/11/2017: BUN 33; Creatinine, Ser 1.34; Hemoglobin  10.0; Platelets 93.0; Potassium 3.7; Sodium 140   Recent Lipid Panel    Component Value Date/Time   CHOL 104 08/24/2017 0925   TRIG 68.0 08/24/2017 0925   HDL 50.80 08/24/2017 0925   CHOLHDL 2 08/24/2017 0925   VLDL 13.6 08/24/2017 0925   LDLCALC 40 08/24/2017 0925    Physical Exam:    VS:  BP 118/66 (BP Location: Right Arm, Patient Position: Sitting, Cuff Size: Large)   Pulse 78   Ht 6\' 1"  (1.854 m)   Wt 299 lb (135.6 kg)   SpO2 90%   BMI 39.45 kg/m      Wt Readings from Last 3 Encounters:  10/15/17 299 lb (135.6 kg)  09/13/17 277 lb (125.6 kg)  08/28/17 262 lb 12.8 oz (119.2 kg)     GEN:  Well nourished, well developed in no acute distress HEENT: Normal NECK: No JVD; No carotid bruits LYMPHATICS: No lymphadenopathy CARDIAC: irregularly irregular, no murmurs, rubs, gallops RESPIRATORY:  Clear to auscultation without rales, wheezing or rhonchi  ABDOMEN: Soft, non-tender, non-distended MUSCULOSKELETAL:  No edema; No deformity  SKIN: Warm and dry NEUROLOGIC:  Alert and oriented x 3 PSYCHIATRIC:  Normal affect   ASSESSMENT:    1. OSA (obstructive sleep apnea)   2. Essential hypertension   3. Obesity (BMI 30-39.9)    PLAN:    In order of problems listed above:  1.  OSA - the patient is tolerating PAP therapy well without any problems. The PAP download was reviewed today and showed an AHI of 19.3/hr on 16/12 cm H2O with 94% compliance in using more than 4 hours nightly.  The patient has been using and benefiting from PAP use and will continue to benefit from therapy. Download shows a large mask leak.  He would like to get a new device as his is more than 80 years old and it is not holding the water like it should.  He would also like to get a new mask.   I will order him a ResMed and 30 mask with chinstrap and supplies.  I will also order a ResMed BiPAP and placed on auto with an IPAP max of 18 cm H2O, EPAP min 6 cm H2O, pressure support 4 cm H2O.  He will see me back in 10 weeks with a download.  2.  HTN - BP is well controlled on exam today.  He will continue on Carvedilol 25mg  BID.  3.  Obesity -he is limited in exercise due to being in a wheelchair chronic medical problems.  4.  Chronic combined systolic/diastolic CHF -he continues to have problems with fluid retention and mainly right heart failure.  He is got lower extremity edema and increased abdominal girth.  He did realize that he was not taking his metolazone prior to taking torsemide a few days ago and is started that with some improvement..  Dr. Alain Marion has been managing his diuretics recently.  I am going to get him back in to see Dr. Tamala Julian this week later this week.  He will continue onBumex 2 mg and torsemide 100 mg daily.  Medication Adjustments/Labs and Tests Ordered: Current medicines are reviewed at length with the patient today.  Concerns regarding medicines are outlined above.  No orders of the defined types were placed in this encounter.  No orders of the defined types were placed in this encounter.   Signed, Fransico Him, MD  10/15/2017 8:39 AM    [place]

## 2017-10-16 ENCOUNTER — Ambulatory Visit: Payer: Medicare Other | Admitting: Internal Medicine

## 2017-10-17 ENCOUNTER — Ambulatory Visit (INDEPENDENT_AMBULATORY_CARE_PROVIDER_SITE_OTHER): Payer: Medicare Other | Admitting: Internal Medicine

## 2017-10-17 ENCOUNTER — Encounter: Payer: Self-pay | Admitting: Internal Medicine

## 2017-10-17 VITALS — BP 114/66 | HR 79 | Temp 98.0°F | Ht 73.0 in | Wt 300.0 lb

## 2017-10-17 DIAGNOSIS — I25708 Atherosclerosis of coronary artery bypass graft(s), unspecified, with other forms of angina pectoris: Secondary | ICD-10-CM

## 2017-10-17 DIAGNOSIS — J441 Chronic obstructive pulmonary disease with (acute) exacerbation: Secondary | ICD-10-CM | POA: Diagnosis not present

## 2017-10-17 DIAGNOSIS — I5032 Chronic diastolic (congestive) heart failure: Secondary | ICD-10-CM

## 2017-10-17 DIAGNOSIS — D61818 Other pancytopenia: Secondary | ICD-10-CM | POA: Diagnosis not present

## 2017-10-17 DIAGNOSIS — E118 Type 2 diabetes mellitus with unspecified complications: Secondary | ICD-10-CM

## 2017-10-17 DIAGNOSIS — I482 Chronic atrial fibrillation, unspecified: Secondary | ICD-10-CM

## 2017-10-17 DIAGNOSIS — R609 Edema, unspecified: Secondary | ICD-10-CM | POA: Diagnosis not present

## 2017-10-17 MED ORDER — BUMETANIDE 1 MG PO TABS: ORAL_TABLET | ORAL | 3 refills | Status: DC

## 2017-10-17 MED ORDER — BUMETANIDE 2 MG PO TABS: ORAL_TABLET | ORAL | 5 refills | Status: DC

## 2017-10-17 MED ORDER — IPRATROPIUM-ALBUTEROL 0.5-2.5 (3) MG/3ML IN SOLN: 3.0000 mL | Freq: Four times a day (QID) | RESPIRATORY_TRACT | 3 refills | Status: DC

## 2017-10-17 NOTE — Assessment & Plan Note (Signed)
On Metformin 

## 2017-10-17 NOTE — Progress Notes (Signed)
Subjective:  Patient ID: [name], male    DOB: [birth year]  Age: 82 y.o. MRN: [id number]  CC: No chief complaint on file.   HPI Min Spahr presents for edema, CHF, low O2  Outpatient Medications Prior to Visit  Medication Sig Dispense Refill  . acetaminophen (TYLENOL) 500 MG tablet Take 500-1,000 mg by mouth daily as needed (for pain).     . carvedilol (COREG) 25 MG tablet Take 1 tablet (25 mg total) by mouth 2 (two) times daily with a meal. 180 tablet 3  . cephALEXin (KEFLEX) 500 MG capsule Take 2 capsules (1,000 mg total) by mouth 2 (two) times daily. 40 capsule 1  . Cholecalciferol (VITAMIN D3) 2000 units capsule Take 1 capsule (2,000 Units total) by mouth daily. 100 capsule 3  . Cyanocobalamin (VITAMIN B-12) 3000 MCG SUBL Take 3,000 mcg by mouth daily.     Marland Kitchen dextromethorphan-guaiFENesin (MUCINEX DM) 30-600 MG 12hr tablet Take 1 tablet by mouth 2 (two) times daily. 20 tablet 0  . Dietary Management Product (VASCULERA) TABS Take 1 tablet by mouth daily. 90 tablet 3  . ezetimibe-simvastatin (VYTORIN) 10-20 MG tablet Take 1 tablet by mouth at bedtime. 90 tablet 3  . fluocinonide cream (LIDEX) 5.46 % Apply 1 application topically 2 (two) times daily. (Patient taking differently: Apply 1 application topically 2 (two) times daily as needed (to affected areas of legs). ) 30 g 3  . folic acid (FOLVITE) 1 MG tablet Take 1 tablet (1 mg total) by mouth daily.    Marland Kitchen glucose blood (ONE TOUCH ULTRA TEST) test strip USE AS DIRECTED TO TEST BLOOD GLUCOSE  EVERY OTHER DAY DX: 250.00 50 each 2  . glucose blood (ONETOUCH VERIO) test strip Use as instructed to check blood sugar once daily. Dx Code E11.8 100 each 3  . metFORMIN (GLUCOPHAGE-XR) 750 MG 24 hr tablet TAKE 2 TABLETS (1,500 MG TOTAL) BY MOUTH DAILY. 180 tablet 2  . NON FORMULARY BiPAP: At bedtime    . NON FORMULARY ONE TOUCH ULTRA TEST STRIPS  AND LANCETS    . ONETOUCH DELICA LANCETS FINE MISC Use to obtain a blood specimen every day Dx  code E11.8 100 each 1  . PROAIR HFA 108 (90 Base) MCG/ACT inhaler Inhale 2 puffs into the lungs every 4 (four) hours as needed (for chest congestion). 18 g 1  . PROAIR HFA 108 (90 Base) MCG/ACT inhaler INHALE 2 PUFFS INTO THE LUNGS EVERY 6 HOURS AS NEEDED (FOR CHEST CONGESTION). 1 Inhaler 11  . torsemide (DEMADEX) 100 MG tablet Take 1 tablet (100 mg total) by mouth daily. 90 tablet 3  . warfarin (COUMADIN) 5 MG tablet TAKE AS DIRECTED BY COUMADIN CLINIC (Patient taking differently: Take 2.5-5 mg by mouth See admin instructions. Take 2.5 mg by mouth in the evening on Sun/Wed/Fri and 5 mg on Mon/Tues/Thurs/Sat) 90 tablet 1  . bumetanide (BUMEX) 1 MG tablet Take 2 tablets (2 mg total) by mouth daily. 180 tablet 3   No facility-administered medications prior to visit.     ROS: Review of Systems  Constitutional: Positive for fatigue. Negative for appetite change and unexpected weight change.  HENT: Negative for congestion, nosebleeds, sneezing, sore throat and trouble swallowing.   Eyes: Negative for itching and visual disturbance.  Respiratory: Positive for shortness of breath. Negative for cough.   Cardiovascular: Positive for leg swelling. Negative for chest pain and palpitations.  Gastrointestinal: Negative for abdominal distention, blood in stool, diarrhea and nausea.  Genitourinary: Negative for frequency  and hematuria.  Musculoskeletal: Positive for gait problem. Negative for back pain, joint swelling and neck pain.  Skin: Negative for rash.  Neurological: Positive for weakness. Negative for dizziness, tremors and speech difficulty.  Psychiatric/Behavioral: Negative for agitation, dysphoric mood, sleep disturbance and suicidal ideas. The patient is not nervous/anxious.     Objective:  BP 114/66 (BP Location: Right Arm, Patient Position: Sitting, Cuff Size: Large)   Pulse 79   Temp 98 F (36.7 C) (Oral)   Ht 6\' 1"  (1.854 m)   Wt 300 lb (136.1 kg)   SpO2 (!) 87% Comment: 2 liters of  o2  BMI 39.58 kg/m   BP Readings from Last 3 Encounters:  10/17/17 114/66  10/15/17 118/66  09/13/17 110/64    Wt Readings from Last 3 Encounters:  10/17/17 300 lb (136.1 kg)  10/15/17 299 lb (135.6 kg)  09/13/17 277 lb (125.6 kg)    Physical Exam  Constitutional: He is oriented to person, place, and time. He appears well-developed. No distress.  NAD  HENT:  Mouth/Throat: Oropharynx is clear and moist.  Eyes: Pupils are equal, round, and reactive to light. Conjunctivae are normal.  Neck: Normal range of motion. No JVD present. No thyromegaly present.  Cardiovascular: Normal rate, regular rhythm, normal heart sounds and intact distal pulses. Exam reveals no gallop and no friction rub.  No murmur heard. Pulmonary/Chest: Breath sounds normal. No respiratory distress. He has no wheezes. He has no rales. He exhibits no tenderness.  Abdominal: Soft. Bowel sounds are normal. He exhibits no distension and no mass. There is no tenderness. There is no rebound and no guarding.  Musculoskeletal: Normal range of motion. He exhibits edema. He exhibits no tenderness.  Lymphadenopathy:    He has no cervical adenopathy.  Neurological: He is alert and oriented to person, place, and time. He has normal reflexes. No cranial nerve deficit. He exhibits normal muscle tone. He displays a negative Romberg sign. Coordination abnormal. Gait normal.  Skin: Skin is warm and dry. No rash noted.  Psychiatric: He has a normal mood and affect. His behavior is normal. Judgment and thought content normal.  on O2 Walker 2-3+ edema up to the abdomen; no cellulitis   Ct Head Wo Contrast  Result Date: 08/22/2017 CLINICAL DATA:  Patient tripped and fell over rug at home hitting head on the floor. Laceration to right eyebrow. Patient on warfarin. EXAM: CT HEAD WITHOUT CONTRAST CT MAXILLOFACIAL WITHOUT CONTRAST CT CERVICAL SPINE WITHOUT CONTRAST TECHNIQUE: Multidetector CT imaging of the head, cervical spine, and  maxillofacial structures were performed using the standard protocol without intravenous contrast. Multiplanar CT image reconstructions of the cervical spine and maxillofacial structures were also generated. COMPARISON:  None. FINDINGS: CT HEAD FINDINGS Brain: Mild age related involutional changes of the brain. No acute intracranial hemorrhage, edema or midline shift. Remote appearing right caudate head lacunar infarct. Chronic appearing minimal small vessel ischemia of periventricular white matter. No intra-axial mass nor extra-axial fluid collections. Vascular: No hyperdense vessel sign. Moderate atherosclerosis of the carotid siphons bilaterally. Skull: No skull fracture. Other: Right supraorbital soft tissue laceration and contusion. CT MAXILLOFACIAL FINDINGS Osseous: Intact maxillofacial bones. Orbits: The orbits appear intact. No retrobulbar hemorrhage or hematoma. No lens detachment. Intact globes. Sinuses: Chronic left maxillary sinusitis with circumferential mucosal thickening and inspissated mucus noted within. No air-fluid levels. Soft tissues: Right periorbital soft tissue swelling with supraorbital laceration. CT CERVICAL SPINE FINDINGS Alignment: Straightening of cervical lordosis. Intact craniocervical relationship and atlantodental interval. Minimal grade 1 anterolisthesis  of C2 on C3. Skull base and vertebrae: No acute fracture. No primary bone lesion or focal pathologic process. Soft tissues and spinal canal: No prevertebral soft tissue swelling or visible canal hematoma. Disc levels: Disc space flattening at all levels of the cervical spine. Large central to right central disc herniation at C3-4 impressing upon the adjacent cord and contributing to moderate central canal stenosis. Small posterior osteophytes at C6-7 slightly impressing upon the adjacent thecal sac. Mild bilateral foraminal encroachment at this level from uncinate spurring. Upper chest: Centrilobular emphysema of the included lung  apices. Aortic atherosclerosis and atherosclerosis of the origins of the great vessels. Extracranial carotid arteriosclerosis is noted bilaterally. Other: None IMPRESSION: 1. No acute intracranial abnormality. Chronic minimal small vessel ischemia and right caudate lacunar infarct. 2. Right supraorbital soft tissue swelling with laceration. No acute maxillofacial fracture. 3. Chronic left maxillary sinus mucosal thickening. 4. No acute cervical spine fracture. 5. Central to right central disc herniation at C3-4 contributing to moderate central canal stenosis. 6. Diffuse cervical spondylosis as above. 7. Centrilobular emphysema at the apices. Electronically Signed   By: Ashley Royalty M.D.   On: 08/22/2017 20:26   Ct Cervical Spine Wo Contrast  Result Date: 08/22/2017 CLINICAL DATA:  Patient tripped and fell over rug at home hitting head on the floor. Laceration to right eyebrow. Patient on warfarin. EXAM: CT HEAD WITHOUT CONTRAST CT MAXILLOFACIAL WITHOUT CONTRAST CT CERVICAL SPINE WITHOUT CONTRAST TECHNIQUE: Multidetector CT imaging of the head, cervical spine, and maxillofacial structures were performed using the standard protocol without intravenous contrast. Multiplanar CT image reconstructions of the cervical spine and maxillofacial structures were also generated. COMPARISON:  None. FINDINGS: CT HEAD FINDINGS Brain: Mild age related involutional changes of the brain. No acute intracranial hemorrhage, edema or midline shift. Remote appearing right caudate head lacunar infarct. Chronic appearing minimal small vessel ischemia of periventricular white matter. No intra-axial mass nor extra-axial fluid collections. Vascular: No hyperdense vessel sign. Moderate atherosclerosis of the carotid siphons bilaterally. Skull: No skull fracture. Other: Right supraorbital soft tissue laceration and contusion. CT MAXILLOFACIAL FINDINGS Osseous: Intact maxillofacial bones. Orbits: The orbits appear intact. No retrobulbar  hemorrhage or hematoma. No lens detachment. Intact globes. Sinuses: Chronic left maxillary sinusitis with circumferential mucosal thickening and inspissated mucus noted within. No air-fluid levels. Soft tissues: Right periorbital soft tissue swelling with supraorbital laceration. CT CERVICAL SPINE FINDINGS Alignment: Straightening of cervical lordosis. Intact craniocervical relationship and atlantodental interval. Minimal grade 1 anterolisthesis of C2 on C3. Skull base and vertebrae: No acute fracture. No primary bone lesion or focal pathologic process. Soft tissues and spinal canal: No prevertebral soft tissue swelling or visible canal hematoma. Disc levels: Disc space flattening at all levels of the cervical spine. Large central to right central disc herniation at C3-4 impressing upon the adjacent cord and contributing to moderate central canal stenosis. Small posterior osteophytes at C6-7 slightly impressing upon the adjacent thecal sac. Mild bilateral foraminal encroachment at this level from uncinate spurring. Upper chest: Centrilobular emphysema of the included lung apices. Aortic atherosclerosis and atherosclerosis of the origins of the great vessels. Extracranial carotid arteriosclerosis is noted bilaterally. Other: None IMPRESSION: 1. No acute intracranial abnormality. Chronic minimal small vessel ischemia and right caudate lacunar infarct. 2. Right supraorbital soft tissue swelling with laceration. No acute maxillofacial fracture. 3. Chronic left maxillary sinus mucosal thickening. 4. No acute cervical spine fracture. 5. Central to right central disc herniation at C3-4 contributing to moderate central canal stenosis. 6. Diffuse cervical spondylosis as  above. 7. Centrilobular emphysema at the apices. Electronically Signed   By: Ashley Royalty M.D.   On: 08/22/2017 20:26   Ct Maxillofacial Wo Contrast  Result Date: 08/22/2017 CLINICAL DATA:  Patient tripped and fell over rug at home hitting head on the  floor. Laceration to right eyebrow. Patient on warfarin. EXAM: CT HEAD WITHOUT CONTRAST CT MAXILLOFACIAL WITHOUT CONTRAST CT CERVICAL SPINE WITHOUT CONTRAST TECHNIQUE: Multidetector CT imaging of the head, cervical spine, and maxillofacial structures were performed using the standard protocol without intravenous contrast. Multiplanar CT image reconstructions of the cervical spine and maxillofacial structures were also generated. COMPARISON:  None. FINDINGS: CT HEAD FINDINGS Brain: Mild age related involutional changes of the brain. No acute intracranial hemorrhage, edema or midline shift. Remote appearing right caudate head lacunar infarct. Chronic appearing minimal small vessel ischemia of periventricular white matter. No intra-axial mass nor extra-axial fluid collections. Vascular: No hyperdense vessel sign. Moderate atherosclerosis of the carotid siphons bilaterally. Skull: No skull fracture. Other: Right supraorbital soft tissue laceration and contusion. CT MAXILLOFACIAL FINDINGS Osseous: Intact maxillofacial bones. Orbits: The orbits appear intact. No retrobulbar hemorrhage or hematoma. No lens detachment. Intact globes. Sinuses: Chronic left maxillary sinusitis with circumferential mucosal thickening and inspissated mucus noted within. No air-fluid levels. Soft tissues: Right periorbital soft tissue swelling with supraorbital laceration. CT CERVICAL SPINE FINDINGS Alignment: Straightening of cervical lordosis. Intact craniocervical relationship and atlantodental interval. Minimal grade 1 anterolisthesis of C2 on C3. Skull base and vertebrae: No acute fracture. No primary bone lesion or focal pathologic process. Soft tissues and spinal canal: No prevertebral soft tissue swelling or visible canal hematoma. Disc levels: Disc space flattening at all levels of the cervical spine. Large central to right central disc herniation at C3-4 impressing upon the adjacent cord and contributing to moderate central canal  stenosis. Small posterior osteophytes at C6-7 slightly impressing upon the adjacent thecal sac. Mild bilateral foraminal encroachment at this level from uncinate spurring. Upper chest: Centrilobular emphysema of the included lung apices. Aortic atherosclerosis and atherosclerosis of the origins of the great vessels. Extracranial carotid arteriosclerosis is noted bilaterally. Other: None IMPRESSION: 1. No acute intracranial abnormality. Chronic minimal small vessel ischemia and right caudate lacunar infarct. 2. Right supraorbital soft tissue swelling with laceration. No acute maxillofacial fracture. 3. Chronic left maxillary sinus mucosal thickening. 4. No acute cervical spine fracture. 5. Central to right central disc herniation at C3-4 contributing to moderate central canal stenosis. 6. Diffuse cervical spondylosis as above. 7. Centrilobular emphysema at the apices. Electronically Signed   By: Ashley Royalty M.D.   On: 08/22/2017 20:26    Assessment & Plan:   There are no diagnoses linked to this encounter.   Meds ordered this encounter  Medications  . DISCONTD: bumetanide (BUMEX) 1 MG tablet    Sig: Take 2 mg with Torsemide. Take another 2 mg in 4 hrs    Dispense:  180 tablet    Refill:  3  . bumetanide (BUMEX) 2 MG tablet    Sig: Take 4 mg with Torsemide. Take another 4 mg in 4 hrs    Dispense:  120 tablet    Refill:  5     Follow-up: No follow-ups on file.  Walker Kehr, MD

## 2017-10-17 NOTE — Assessment & Plan Note (Signed)
CBC

## 2017-10-17 NOTE — Assessment & Plan Note (Signed)
appt w/Dr Tamala Julian tomorrow

## 2017-10-17 NOTE — Assessment & Plan Note (Signed)
Coumadin 

## 2017-10-17 NOTE — Assessment & Plan Note (Signed)
Demadex and Bumex - dose increased, see Rx

## 2017-10-17 NOTE — Progress Notes (Signed)
Cardiology Office Note  and ADMISSION H&P   Date:  10/18/2017   ID:  [name], DOB [birth year], MRN [id number]  PCP:  Cassandria Anger, MD  Cardiologist: [name], MD   Chief Complaint  Patient presents with  . Congestive Heart Failure    History of Present Illness:  [name] is a 82 y.o. male who presents for with mechanical aortic valve 1994, obstructive sleep apnea on chronic CPAP, chronic atrial fibrillation, chronic combined systolic and diastolic heart failure, chronic stage III CKD, Coumadin therapy, prior CABG 1994, and recently diagnosed with COPD and placed on chronic oxygen therapy by hospitalist during an early June 2019 admission  Progressive abdominal swelling, lower extremity swelling with anasarca over the past 6 weeks..  Recently hospitalized Aug 28, 2017 by hospitalist service.  At the time of discharge he weighed 250 pounds.  He now weighs 300 pounds.  His diuretic therapy has been escalated by primary care but is not bringing about any diuresis.  He is on chronic oxygen therapy since late May 2019 when he was admitted to [facility] in today having difficulty walking.  He denies chest pain.  He does complain of orthopnea, anorexia, and severe lower extremity pain and oozing from skin lesions.   Past Medical History:  Diagnosis Date  . Aortic stenosis   . Carotid artery disease ([place]) 1994   s/p left carotid endarerectomy   . CHF with unknown LVEF (HCC)    47 %  . Chronic atrial fibrillation ([place])   . Hx of CABG   . Hypertension   . Left ventricular dysfunction 2007   LVEF 47%   . OSA (obstructive sleep apnea)   . Rheumatic fever   . Subclavian bypass stenosis ([place])   . Vitamin B12 deficiency     Past Surgical History:  Procedure Laterality Date  . [place]   replaced due to aortic stenosis, [employer] Jude mechanical prostesis  . CAROTID ENDARTERECTOMY Left 1997   subclavian bypass Done in [place]  .  CORONARY ARTERY BYPASS GRAFT  1994   w SVG to RCA and SVG to circumflex  . heart bypass     Done in [place]    Current Medications: Outpatient Medications Prior to Visit  Medication Sig Dispense Refill  . acetaminophen (TYLENOL) 500 MG tablet Take 500-1,000 mg by mouth daily as needed (for pain).     . bumetanide (BUMEX) 2 MG tablet Take 4 mg with Torsemide. Take another 4 mg in 4 hrs 120 tablet 5  . carvedilol (COREG) 25 MG tablet Take 1 tablet (25 mg total) by mouth 2 (two) times daily with a meal. 180 tablet 3  . Cholecalciferol (VITAMIN D3) 2000 units capsule Take 1 capsule (2,000 Units total) by mouth daily. 100 capsule 3  . Cyanocobalamin (VITAMIN B-12) 3000 MCG SUBL Take 3,000 mcg by mouth daily.     Marland Kitchen dextromethorphan-guaiFENesin (MUCINEX DM) 30-600 MG 12hr tablet Take 1 tablet by mouth 2 (two) times daily. 20 tablet 0  . Dietary Management Product (VASCULERA) TABS Take 1 tablet by mouth daily. 90 tablet 3  . ezetimibe-simvastatin (VYTORIN) 10-20 MG tablet Take 1 tablet by mouth at bedtime. 90 tablet 3  . fluocinonide cream (LIDEX) 6.56 % Apply 1 application topically 2 (two) times daily as needed (For legs).    . folic acid (FOLVITE) 1 MG tablet Take 1 tablet (1 mg total) by mouth daily.    Marland Kitchen glucose  blood (ONE TOUCH ULTRA TEST) test strip USE AS DIRECTED TO TEST BLOOD GLUCOSE  EVERY OTHER DAY DX: 250.00 50 each 2  . glucose blood (ONETOUCH VERIO) test strip Use as instructed to check blood sugar once daily. Dx Code E11.8 100 each 3  . ipratropium-albuterol (DUONEB) 0.5-2.5 (3) MG/3ML SOLN Take 3 mLs by nebulization 4 (four) times daily. 360 mL 3  . metFORMIN (GLUCOPHAGE-XR) 750 MG 24 hr tablet TAKE 2 TABLETS (1,500 MG TOTAL) BY MOUTH DAILY. 180 tablet 2  . NON FORMULARY BiPAP: At bedtime    . NON FORMULARY ONE TOUCH ULTRA TEST STRIPS  AND LANCETS    . ONETOUCH DELICA LANCETS FINE MISC Use to obtain a blood specimen every day Dx code E11.8 100 each 1  . PROAIR HFA 108 (90 Base)  MCG/ACT inhaler INHALE 2 PUFFS INTO THE LUNGS EVERY 6 HOURS AS NEEDED (FOR CHEST CONGESTION). 1 Inhaler 11  . torsemide (DEMADEX) 100 MG tablet Take 1 tablet (100 mg total) by mouth daily. 90 tablet 3  . warfarin (COUMADIN) 5 MG tablet Take 5 mg by mouth daily EXCEPT for two (2) days a week take 2.5 mg by mouth daily. Take as directed per Coumadin Clinic.    . fluocinonide cream (LIDEX) 0.97 % Apply 1 application topically 2 (two) times daily. (Patient not taking: Reported on 10/18/2017) 30 g 3  . warfarin (COUMADIN) 5 MG tablet TAKE AS DIRECTED BY COUMADIN CLINIC (Patient not taking: Reported on 10/18/2017) 90 tablet 1   No facility-administered medications prior to visit.      Allergies:   Rifampin and Vancomycin   Social History   Socioeconomic History  . Marital status: Married    Spouse name: Not on file  . Number of children: Not on file  . Years of education: Not on file  . Highest education level: Not on file  Occupational History  . Not on file  Social Needs  . Financial resource strain: Not on file  . Food insecurity:    Worry: Not on file    Inability: Not on file  . Transportation needs:    Medical: Not on file    Non-medical: Not on file  Tobacco Use  . Smoking status: Former Smoker    Packs/day: 1.00    Years: 30.00    Pack years: 30.00    Types: Cigarettes    Last attempt to quit: 04/03/1992    Years since quitting: 25.5  . Smokeless tobacco: Never Used  Substance and Sexual Activity  . Alcohol use: Yes    Comment: 4-6 ounces of wine daily  . Drug use: No    Comment: Half a cup a day.  Marland Kitchen Sexual activity: Not on file  Lifestyle  . Physical activity:    Days per week: Not on file    Minutes per session: Not on file  . Stress: Not on file  Relationships  . Social connections:    Talks on phone: Not on file    Gets together: Not on file    Attends religious service: Not on file    Active member of club or organization: Not on file    Attends meetings of  clubs or organizations: Not on file    Relationship status: Not on file  Other Topics Concern  . Not on file  Social History Narrative  . Not on file     Family History:  The patient's family history includes Cancer in his father; Hypertension in his mother.  ROS:   Please see the history of present illness.    Anorexia, lower extremity swelling with weeping skin lesions. All other systems reviewed and are negative.   PHYSICAL EXAM:   VS:  BP 102/70   Pulse 66   Ht 6' 1.5" (1.867 m)   Wt 299 lb 9.6 oz (135.9 kg)   BMI 38.99 kg/m    GEN: Chronically ill appearing.  Distended neck veins sitting at 90 degrees.  Oxygen cannula in place. HEENT: normal  Neck: Marked JVD with V waves refluxing to the jaw bilaterally.  No carotid bruits, or masses. Cardiac: IIRR; no murmurs, rubs, or gallop.  Mechanical aortic valve opening and closure sound.  No diastolic murmur is heard.  There is tense 3-4+ edema from ankles to the mid thigh bilaterally.  Presacral edema also noted. Respiratory:  clear to auscultation bilaterally, normal work of breathing.  Presacral edema.  A GI: soft, nontender, nondistended, + BS.  Ascites is present. MS: no deformity or atrophy  Skin: warm and dry, no rash Neuro:  Alert and Oriented x 3, Strength and sensation are intact Psych: euthymic mood, full affect  Wt Readings from Last 3 Encounters:  10/18/17 299 lb 9.6 oz (135.9 kg)  10/17/17 300 lb (136.1 kg)  10/15/17 299 lb (135.6 kg)      Studies/Labs Reviewed:   EKG:  EKG atrial fibrillation with nonspecific interventricular conduction delay Aug 01, 2017.  No more recent data available.  Low voltage is noted.  Recent Labs: 07/10/2017: Pro B Natriuretic peptide (BNP) 411.0 08/01/2017: B Natriuretic Peptide 784.4 08/03/2017: Magnesium 1.9 09/11/2017: Hemoglobin 10.0; Platelets 93.0 10/15/2017: ALT 13; BUN 32; Creatinine, Ser 1.40; Potassium 3.9; Sodium 142   Lipid Panel    Component Value Date/Time   CHOL 104  08/24/2017 0925   TRIG 68.0 08/24/2017 0925   HDL 50.80 08/24/2017 0925   CHOLHDL 2 08/24/2017 0925   VLDL 13.6 08/24/2017 0925   LDLCALC 40 08/24/2017 0925    Additional studies/ records that were reviewed today include:  2D Doppler echocardiogram February 07, 2017: Study Conclusions   - Left ventricle: Wall thickness was increased in a pattern of mild   LVH. Systolic function was normal. The estimated ejection   fraction was in the range of 55% to 60%. - Aortic valve: Normal appearing mechanical AVR with no peri   valvular regurgitation. - Mitral valve: Moderately calcified annulus. Moderately thickened   leaflets . There was moderate regurgitation. - Left atrium: The atrium was severely dilated. - Atrial septum: No defect or patent foramen ovale was identified.    ASSESSMENT:    1. Acute on chronic right-sided heart failure ([place])   2. Chronic diastolic heart failure ([place])   3. Chronic atrial fibrillation ([place])   4. Atherosclerosis of coronary artery bypass graft of native heart with stable angina pectoris ([place])   5. Essential hypertension   6. OSA (obstructive sleep apnea)   7. Type 2 diabetes mellitus with complication, without long-term current use of insulin ([place])   8. Long term current use of anticoagulant therapy      PLAN:  In order of problems listed above:  1. Biventricular failure with ascites and anasarca.  Greater than 50 pound increase in weight over the past 4 to 6 weeks despite escalation of diuretic therapy.  He has predominant right heart failure greater than left heart failure.  I have recommended that the patient be admitted to telemetry for IV diuresis, advanced heart failure team consult, and  an updated 2D Doppler echocardiogram to assess RV size and function.  We had discussion concerning prognosis.  He was informed that this presentation is a poor prognostic indicator.  We did discuss CODE STATUS.  States he does not want to be put on life support but  has not made a decision about DNR.  Further discussion will occur.  Discussion occurred in the presence of his wife.  We will check a TSH. 2. As noted above chronic heart failure is present.  LV systolic function will be reassessed with echo 3. Controlled rate on current medical therapy. 4. History of coronary bypass grafting.  No current symptoms suggest angina.  Will not check troponin on admission. 5. Low blood pressure secondary to diuresis.  Will adjust therapy on admission to allow diuresis. 6. Will need to wear CPAP during hospital stay. 7. Not addressed.   8. We will continue Coumadin anticoagulation long-term with.  Mechanical aortic valve.  Overall the patient is chronically ill.  He has more than 50 pounds volume overload recent diagnosis of COPD was made although I am not certain that he has this diagnosis.  Being admitted for diuresis to decrease symptoms.  Will need to have discussions concerning intensity of care, perhaps with palliative care service.  Medication Adjustments/Labs and Tests Ordered: Current medicines are reviewed at length with the patient today.  Concerns regarding medicines are outlined above.  Medication changes, Labs and Tests ordered today are listed in the Patient Instructions below. Patient Instructions  Medication Instructions:  Your physician recommends that you continue on your current medications as directed. Please refer to the Current Medication list given to you today.  Labwork: None  Testing/Procedures: None  Follow-Up: Keep follow up with Dr. Tamala Julian as scheduled.   Any Other Special Instructions Will Be Listed Below (If Applicable).  I have contacted [facility] for admission.  They will contact you to come over once a bed is open.  If any changes in your symptoms please go to ER or contact the office.    If you need a refill on your cardiac medications before your next appointment, please call your pharmacy.      Signed, [name], MD  10/18/2017 1:10 PM    [facility] [place], [place], [place]  [address] Phone: [phone]; Fax: [phone]

## 2017-10-17 NOTE — Assessment & Plan Note (Signed)
Duoneb qid

## 2017-10-18 ENCOUNTER — Inpatient Hospital Stay (HOSPITAL_COMMUNITY)
Admission: AD | Admit: 2017-10-18 | Discharge: 2017-11-05 | DRG: 291 | Disposition: A | Discharge status: Home Health | Payer: Medicare Other | Source: Ambulatory Visit | Attending: Interventional Cardiology | Admitting: Interventional Cardiology

## 2017-10-18 ENCOUNTER — Telehealth: Payer: Self-pay | Admitting: Internal Medicine

## 2017-10-18 ENCOUNTER — Inpatient Hospital Stay (HOSPITAL_COMMUNITY): Payer: Medicare Other

## 2017-10-18 ENCOUNTER — Other Ambulatory Visit: Payer: Self-pay

## 2017-10-18 ENCOUNTER — Ambulatory Visit (INDEPENDENT_AMBULATORY_CARE_PROVIDER_SITE_OTHER): Payer: Medicare Other | Admitting: Interventional Cardiology

## 2017-10-18 ENCOUNTER — Telehealth: Payer: Self-pay

## 2017-10-18 ENCOUNTER — Encounter (HOSPITAL_COMMUNITY): Payer: Self-pay

## 2017-10-18 ENCOUNTER — Encounter: Payer: Self-pay | Admitting: Interventional Cardiology

## 2017-10-18 ENCOUNTER — Other Ambulatory Visit: Payer: Self-pay | Admitting: Interventional Cardiology

## 2017-10-18 VITALS — BP 102/70 | HR 66 | Ht 73.5 in | Wt 299.6 lb

## 2017-10-18 DIAGNOSIS — I5032 Chronic diastolic (congestive) heart failure: Secondary | ICD-10-CM | POA: Diagnosis not present

## 2017-10-18 DIAGNOSIS — I361 Nonrheumatic tricuspid (valve) insufficiency: Secondary | ICD-10-CM | POA: Diagnosis not present

## 2017-10-18 DIAGNOSIS — R040 Epistaxis: Secondary | ICD-10-CM | POA: Diagnosis not present

## 2017-10-18 DIAGNOSIS — Z952 Presence of prosthetic heart valve: Secondary | ICD-10-CM

## 2017-10-18 DIAGNOSIS — E1122 Type 2 diabetes mellitus with diabetic chronic kidney disease: Secondary | ICD-10-CM | POA: Diagnosis present

## 2017-10-18 DIAGNOSIS — K5641 Fecal impaction: Secondary | ICD-10-CM | POA: Diagnosis not present

## 2017-10-18 DIAGNOSIS — E876 Hypokalemia: Secondary | ICD-10-CM | POA: Diagnosis not present

## 2017-10-18 DIAGNOSIS — K045 Chronic apical periodontitis: Secondary | ICD-10-CM | POA: Diagnosis not present

## 2017-10-18 DIAGNOSIS — I482 Chronic atrial fibrillation, unspecified: Secondary | ICD-10-CM

## 2017-10-18 DIAGNOSIS — E118 Type 2 diabetes mellitus with unspecified complications: Secondary | ICD-10-CM

## 2017-10-18 DIAGNOSIS — J449 Chronic obstructive pulmonary disease, unspecified: Secondary | ICD-10-CM | POA: Diagnosis present

## 2017-10-18 DIAGNOSIS — M109 Gout, unspecified: Secondary | ICD-10-CM | POA: Diagnosis present

## 2017-10-18 DIAGNOSIS — N183 Chronic kidney disease, stage 3 (moderate): Secondary | ICD-10-CM | POA: Diagnosis present

## 2017-10-18 DIAGNOSIS — K5900 Constipation, unspecified: Secondary | ICD-10-CM | POA: Diagnosis present

## 2017-10-18 DIAGNOSIS — L989 Disorder of the skin and subcutaneous tissue, unspecified: Secondary | ICD-10-CM | POA: Diagnosis present

## 2017-10-18 DIAGNOSIS — I4891 Unspecified atrial fibrillation: Secondary | ICD-10-CM | POA: Diagnosis present

## 2017-10-18 DIAGNOSIS — I679 Cerebrovascular disease, unspecified: Secondary | ICD-10-CM | POA: Diagnosis present

## 2017-10-18 DIAGNOSIS — E1151 Type 2 diabetes mellitus with diabetic peripheral angiopathy without gangrene: Secondary | ICD-10-CM | POA: Diagnosis present

## 2017-10-18 DIAGNOSIS — I25810 Atherosclerosis of coronary artery bypass graft(s) without angina pectoris: Secondary | ICD-10-CM | POA: Diagnosis present

## 2017-10-18 DIAGNOSIS — Z7984 Long term (current) use of oral hypoglycemic drugs: Secondary | ICD-10-CM

## 2017-10-18 DIAGNOSIS — I10 Essential (primary) hypertension: Secondary | ICD-10-CM

## 2017-10-18 DIAGNOSIS — I340 Nonrheumatic mitral (valve) insufficiency: Secondary | ICD-10-CM | POA: Diagnosis not present

## 2017-10-18 DIAGNOSIS — I481 Persistent atrial fibrillation: Secondary | ICD-10-CM | POA: Diagnosis present

## 2017-10-18 DIAGNOSIS — R42 Dizziness and giddiness: Secondary | ICD-10-CM | POA: Diagnosis present

## 2017-10-18 DIAGNOSIS — I25708 Atherosclerosis of coronary artery bypass graft(s), unspecified, with other forms of angina pectoris: Secondary | ICD-10-CM

## 2017-10-18 DIAGNOSIS — R0602 Shortness of breath: Secondary | ICD-10-CM | POA: Diagnosis not present

## 2017-10-18 DIAGNOSIS — D696 Thrombocytopenia, unspecified: Secondary | ICD-10-CM | POA: Diagnosis present

## 2017-10-18 DIAGNOSIS — I509 Heart failure, unspecified: Secondary | ICD-10-CM | POA: Diagnosis not present

## 2017-10-18 DIAGNOSIS — Z7901 Long term (current) use of anticoagulants: Secondary | ICD-10-CM

## 2017-10-18 DIAGNOSIS — J441 Chronic obstructive pulmonary disease with (acute) exacerbation: Secondary | ICD-10-CM | POA: Diagnosis not present

## 2017-10-18 DIAGNOSIS — I5043 Acute on chronic combined systolic (congestive) and diastolic (congestive) heart failure: Secondary | ICD-10-CM | POA: Diagnosis present

## 2017-10-18 DIAGNOSIS — I50810 Right heart failure, unspecified: Secondary | ICD-10-CM | POA: Diagnosis not present

## 2017-10-18 DIAGNOSIS — I080 Rheumatic disorders of both mitral and aortic valves: Secondary | ICD-10-CM | POA: Diagnosis present

## 2017-10-18 DIAGNOSIS — K089 Disorder of teeth and supporting structures, unspecified: Secondary | ICD-10-CM | POA: Diagnosis not present

## 2017-10-18 DIAGNOSIS — Z515 Encounter for palliative care: Secondary | ICD-10-CM | POA: Diagnosis not present

## 2017-10-18 DIAGNOSIS — I130 Hypertensive heart and chronic kidney disease with heart failure and stage 1 through stage 4 chronic kidney disease, or unspecified chronic kidney disease: Principal | ICD-10-CM | POA: Diagnosis present

## 2017-10-18 DIAGNOSIS — I5033 Acute on chronic diastolic (congestive) heart failure: Secondary | ICD-10-CM | POA: Diagnosis not present

## 2017-10-18 DIAGNOSIS — D649 Anemia, unspecified: Secondary | ICD-10-CM | POA: Diagnosis not present

## 2017-10-18 DIAGNOSIS — K0530 Chronic periodontitis, unspecified: Secondary | ICD-10-CM | POA: Diagnosis not present

## 2017-10-18 DIAGNOSIS — Z8249 Family history of ischemic heart disease and other diseases of the circulatory system: Secondary | ICD-10-CM

## 2017-10-18 DIAGNOSIS — Z7189 Other specified counseling: Secondary | ICD-10-CM

## 2017-10-18 DIAGNOSIS — Z79899 Other long term (current) drug therapy: Secondary | ICD-10-CM

## 2017-10-18 DIAGNOSIS — I50813 Acute on chronic right heart failure: Secondary | ICD-10-CM | POA: Diagnosis not present

## 2017-10-18 DIAGNOSIS — L8990 Pressure ulcer of unspecified site, unspecified stage: Secondary | ICD-10-CM

## 2017-10-18 DIAGNOSIS — K08409 Partial loss of teeth, unspecified cause, unspecified class: Secondary | ICD-10-CM | POA: Diagnosis not present

## 2017-10-18 DIAGNOSIS — Z9981 Dependence on supplemental oxygen: Secondary | ICD-10-CM | POA: Diagnosis not present

## 2017-10-18 DIAGNOSIS — R0902 Hypoxemia: Secondary | ICD-10-CM | POA: Diagnosis not present

## 2017-10-18 DIAGNOSIS — K029 Dental caries, unspecified: Secondary | ICD-10-CM | POA: Diagnosis not present

## 2017-10-18 DIAGNOSIS — J90 Pleural effusion, not elsewhere classified: Secondary | ICD-10-CM | POA: Diagnosis not present

## 2017-10-18 DIAGNOSIS — G4733 Obstructive sleep apnea (adult) (pediatric): Secondary | ICD-10-CM

## 2017-10-18 DIAGNOSIS — I2510 Atherosclerotic heart disease of native coronary artery without angina pectoris: Secondary | ICD-10-CM | POA: Diagnosis present

## 2017-10-18 DIAGNOSIS — Z452 Encounter for adjustment and management of vascular access device: Secondary | ICD-10-CM

## 2017-10-18 DIAGNOSIS — I959 Hypotension, unspecified: Secondary | ICD-10-CM | POA: Diagnosis not present

## 2017-10-18 DIAGNOSIS — I5020 Unspecified systolic (congestive) heart failure: Secondary | ICD-10-CM

## 2017-10-18 DIAGNOSIS — I25118 Atherosclerotic heart disease of native coronary artery with other forms of angina pectoris: Secondary | ICD-10-CM | POA: Diagnosis present

## 2017-10-18 DIAGNOSIS — D631 Anemia in chronic kidney disease: Secondary | ICD-10-CM | POA: Diagnosis present

## 2017-10-18 DIAGNOSIS — K036 Deposits [accretions] on teeth: Secondary | ICD-10-CM | POA: Diagnosis not present

## 2017-10-18 HISTORY — DX: Chronic diastolic (congestive) heart failure: I50.32

## 2017-10-18 HISTORY — DX: Presence of prosthetic heart valve: Z95.2

## 2017-10-18 LAB — CBC WITH DIFFERENTIAL/PLATELET
Basophils Absolute: 0 10*3/uL (ref 0.0–0.1)
Basophils Relative: 1 %
EOS PCT: 1 %
Eosinophils Absolute: 0 10*3/uL (ref 0.0–0.7)
HEMATOCRIT: 31.9 % — AB (ref 39.0–52.0)
Hemoglobin: 9.8 g/dL — ABNORMAL LOW (ref 13.0–17.0)
LYMPHS ABS: 0.6 10*3/uL — AB (ref 0.7–4.0)
Lymphocytes Relative: 18 %
MCH: 34.1 pg — ABNORMAL HIGH (ref 26.0–34.0)
MCHC: 30.7 g/dL (ref 30.0–36.0)
MCV: 111.1 fL — AB (ref 78.0–100.0)
MONO ABS: 0.4 10*3/uL (ref 0.1–1.0)
MONOS PCT: 11 %
NEUTROS ABS: 2.6 10*3/uL (ref 1.7–7.7)
Neutrophils Relative %: 69 %
PLATELETS: 75 10*3/uL — AB (ref 150–400)
RBC: 2.87 MIL/uL — AB (ref 4.22–5.81)
RDW: 17 % — AB (ref 11.5–15.5)
WBC: 3.6 10*3/uL — AB (ref 4.0–10.5)

## 2017-10-18 LAB — COMPREHENSIVE METABOLIC PANEL
ALBUMIN: 3.2 g/dL — AB (ref 3.5–5.0)
ALT: 15 U/L (ref 0–44)
AST: 29 U/L (ref 15–41)
Alkaline Phosphatase: 146 U/L — ABNORMAL HIGH (ref 38–126)
Anion gap: 14 (ref 5–15)
BILIRUBIN TOTAL: 2 mg/dL — AB (ref 0.3–1.2)
BUN: 31 mg/dL — AB (ref 8–23)
CHLORIDE: 99 mmol/L (ref 98–111)
CO2: 30 mmol/L (ref 22–32)
CREATININE: 1.47 mg/dL — AB (ref 0.61–1.24)
Calcium: 8.9 mg/dL (ref 8.9–10.3)
GFR calc Af Amer: 50 mL/min — ABNORMAL LOW (ref >60)
GFR, EST NON AFRICAN AMERICAN: 43 mL/min — AB (ref >60)
GLUCOSE: 126 mg/dL — AB (ref 70–99)
POTASSIUM: 3.3 mmol/L — AB (ref 3.5–5.1)
Sodium: 143 mmol/L (ref 135–145)
TOTAL PROTEIN: 6.4 g/dL — AB (ref 6.5–8.1)

## 2017-10-18 LAB — GLUCOSE, CAPILLARY
GLUCOSE-CAPILLARY: 111 mg/dL — AB (ref 70–99)
Glucose-Capillary: 116 mg/dL — ABNORMAL HIGH (ref 70–99)

## 2017-10-18 LAB — PROTIME-INR
INR: 3.02
PROTHROMBIN TIME: 31.1 s — AB (ref 11.4–15.2)

## 2017-10-18 LAB — BRAIN NATRIURETIC PEPTIDE: B Natriuretic Peptide: 1073.3 pg/mL — ABNORMAL HIGH (ref 0.0–100.0)

## 2017-10-18 LAB — TSH: TSH: 5.026 u[IU]/mL — ABNORMAL HIGH (ref 0.350–4.500)

## 2017-10-18 LAB — MAGNESIUM: Magnesium: 1.7 mg/dL (ref 1.7–2.4)

## 2017-10-18 MED ORDER — WARFARIN - PHARMACIST DOSING INPATIENT
Freq: Every day | Status: DC
  Administered 2017-10-18 – 2017-11-03 (×11)

## 2017-10-18 MED ORDER — INSULIN ASPART 100 UNIT/ML ~~LOC~~ SOLN
0.0000 [IU] | Freq: Three times a day (TID) | SUBCUTANEOUS | Status: DC
  Administered 2017-10-19 – 2017-10-22 (×6): 2 [IU] via SUBCUTANEOUS
  Administered 2017-10-22: 3 [IU] via SUBCUTANEOUS
  Administered 2017-10-24 – 2017-10-25 (×5): 2 [IU] via SUBCUTANEOUS
  Administered 2017-10-26: 15 [IU] via SUBCUTANEOUS
  Administered 2017-10-26 – 2017-11-04 (×12): 2 [IU] via SUBCUTANEOUS

## 2017-10-18 MED ORDER — INSULIN ASPART 100 UNIT/ML ~~LOC~~ SOLN: 0.0000 [IU] | Freq: Three times a day (TID) | SUBCUTANEOUS | Status: DC

## 2017-10-18 MED ORDER — SODIUM CHLORIDE 0.9% FLUSH
3.0000 mL | Freq: Two times a day (BID) | INTRAVENOUS | Status: DC
  Administered 2017-10-18 – 2017-11-04 (×18): 3 mL via INTRAVENOUS

## 2017-10-18 MED ORDER — POTASSIUM CHLORIDE ER 10 MEQ PO TBCR
20.0000 meq | EXTENDED_RELEASE_TABLET | Freq: Once | ORAL | Status: AC
  Administered 2017-10-18: 20 meq via ORAL
  Filled 2017-10-18 (×2): qty 2

## 2017-10-18 MED ORDER — VITAMIN B-12 3000 MCG SL SUBL: 3000.0000 ug | SUBLINGUAL_TABLET | Freq: Every day | SUBLINGUAL | Status: DC

## 2017-10-18 MED ORDER — SODIUM CHLORIDE 0.9% FLUSH: 3.0000 mL | INTRAVENOUS | Status: DC | PRN

## 2017-10-18 MED ORDER — POTASSIUM CHLORIDE ER 10 MEQ PO TBCR
40.0000 meq | EXTENDED_RELEASE_TABLET | Freq: Once | ORAL | Status: AC
  Administered 2017-10-18: 40 meq via ORAL
  Filled 2017-10-18 (×2): qty 4

## 2017-10-18 MED ORDER — ACETAMINOPHEN 325 MG PO TABS: 650.0000 mg | ORAL_TABLET | ORAL | Status: DC | PRN

## 2017-10-18 MED ORDER — IPRATROPIUM-ALBUTEROL 0.5-2.5 (3) MG/3ML IN SOLN
3.0000 mL | Freq: Four times a day (QID) | RESPIRATORY_TRACT | Status: DC
Start: 2017-10-18 — End: 2017-10-19
  Administered 2017-10-18 (×2): 3 mL via RESPIRATORY_TRACT
  Filled 2017-10-18 (×2): qty 3

## 2017-10-18 MED ORDER — FOLIC ACID 1 MG PO TABS
1.0000 mg | ORAL_TABLET | Freq: Every day | ORAL | Status: DC
  Administered 2017-10-19 – 2017-11-05 (×18): 1 mg via ORAL
  Filled 2017-10-18 (×19): qty 1

## 2017-10-18 MED ORDER — FUROSEMIDE 10 MG/ML IJ SOLN
80.0000 mg | Freq: Four times a day (QID) | INTRAMUSCULAR | Status: DC
  Administered 2017-10-18 – 2017-10-19 (×4): 80 mg via INTRAVENOUS
  Filled 2017-10-18 (×5): qty 8

## 2017-10-18 MED ORDER — WARFARIN SODIUM 5 MG PO TABS
5.0000 mg | ORAL_TABLET | Freq: Once | ORAL | Status: AC
  Administered 2017-10-18: 5 mg via ORAL
  Filled 2017-10-18: qty 1

## 2017-10-18 MED ORDER — SODIUM CHLORIDE 0.9 % IV SOLN: 250.0000 mL | INTRAVENOUS | Status: DC | PRN

## 2017-10-18 MED ORDER — ONDANSETRON HCL 4 MG/2ML IJ SOLN: 4.0000 mg | Freq: Four times a day (QID) | INTRAMUSCULAR | Status: DC | PRN

## 2017-10-18 MED ORDER — VITAMIN B-12 1000 MCG PO TABS
3000.0000 ug | ORAL_TABLET | Freq: Every day | ORAL | Status: DC
  Administered 2017-10-19 – 2017-11-05 (×18): 3000 ug via ORAL
  Filled 2017-10-18 (×18): qty 3

## 2017-10-18 MED ORDER — FLUOCINONIDE 0.05 % EX CREA
1.0000 "application " | TOPICAL_CREAM | Freq: Two times a day (BID) | CUTANEOUS | Status: DC | PRN
  Filled 2017-10-18: qty 15

## 2017-10-18 MED ORDER — CARVEDILOL 12.5 MG PO TABS
12.5000 mg | ORAL_TABLET | Freq: Two times a day (BID) | ORAL | Status: DC
  Administered 2017-10-18 – 2017-10-25 (×14): 12.5 mg via ORAL
  Filled 2017-10-18 (×15): qty 1

## 2017-10-18 MED ORDER — METFORMIN HCL ER 500 MG PO TB24
1500.0000 mg | ORAL_TABLET | Freq: Every day | ORAL | Status: DC
Start: 2017-10-19 — End: 2017-10-18

## 2017-10-18 MED ORDER — EZETIMIBE-SIMVASTATIN 10-20 MG PO TABS
1.0000 | ORAL_TABLET | Freq: Every day | ORAL | Status: DC
  Administered 2017-10-18 – 2017-11-04 (×18): 1 via ORAL
  Filled 2017-10-18 (×19): qty 1

## 2017-10-18 MED ORDER — IPRATROPIUM-ALBUTEROL 0.5-2.5 (3) MG/3ML IN SOLN
RESPIRATORY_TRACT | Status: AC
  Filled 2017-10-18: qty 3

## 2017-10-18 NOTE — H&P (Signed)
Cardiology Office Note  and ADMISSION H&P   Date:  10/18/2017   ID:  [name], DOB 1935-07-26, MRN [id number]  PCP:  Cassandria Anger, MD  Cardiologist: [name], MD   Chief Complaint  Patient presents with  . Congestive Heart Failure    History of Present Illness:  [name] is a 82 y.o. male who presents for with mechanical aortic valve 1994, obstructive sleep apnea on chronic CPAP, chronic atrial fibrillation, chronic combined systolic and diastolic heart failure, chronic stage III CKD, Coumadin therapy, prior CABG 1994, and recently diagnosed with COPD and placed on chronic oxygen therapy by hospitalist during an early June 2019 admission  Progressive abdominal swelling, lower extremity swelling with anasarca over the past 6 weeks..  Recently hospitalized Aug 28, 2017 by hospitalist service.  At the time of discharge he weighed 250 pounds.  He now weighs 300 pounds.  His diuretic therapy has been escalated by primary care but is not bringing about any diuresis.  He is on chronic oxygen therapy since late May 2019 when he was admitted to [facility] in today having difficulty walking.  He denies chest pain.  He does complain of orthopnea, anorexia, and severe lower extremity pain and oozing from skin lesions.   Past Medical History:  Diagnosis Date  . Aortic stenosis   . Carotid artery disease ([place]) 1994   s/p left carotid endarerectomy   . CHF with unknown LVEF (HCC)    47 %  . Chronic atrial fibrillation ([place])   . Hx of CABG   . Hypertension   . Left ventricular dysfunction 2007   LVEF 47%   . OSA (obstructive sleep apnea)   . Rheumatic fever   . Subclavian bypass stenosis ([place])   . Vitamin B12 deficiency     Past Surgical History:  Procedure Laterality Date  . [place]   replaced due to aortic stenosis, St. Jude mechanical prostesis  . CAROTID ENDARTERECTOMY Left 1997   subclavian bypass Done in [place]  .  CORONARY ARTERY BYPASS GRAFT  1994   w SVG to RCA and SVG to circumflex  . heart bypass     Done in [place]    Current Medications: Outpatient Medications Prior to Visit  Medication Sig Dispense Refill  . acetaminophen (TYLENOL) 500 MG tablet Take 500-1,000 mg by mouth daily as needed (for pain).     . bumetanide (BUMEX) 2 MG tablet Take 4 mg with Torsemide. Take another 4 mg in 4 hrs 120 tablet 5  . carvedilol (COREG) 25 MG tablet Take 1 tablet (25 mg total) by mouth 2 (two) times daily with a meal. 180 tablet 3  . Cholecalciferol (VITAMIN D3) 2000 units capsule Take 1 capsule (2,000 Units total) by mouth daily. 100 capsule 3  . Cyanocobalamin (VITAMIN B-12) 3000 MCG SUBL Take 3,000 mcg by mouth daily.     Marland Kitchen dextromethorphan-guaiFENesin (MUCINEX DM) 30-600 MG 12hr tablet Take 1 tablet by mouth 2 (two) times daily. 20 tablet 0  . Dietary Management Product (VASCULERA) TABS Take 1 tablet by mouth daily. 90 tablet 3  . ezetimibe-simvastatin (VYTORIN) 10-20 MG tablet Take 1 tablet by mouth at bedtime. 90 tablet 3  . fluocinonide cream (LIDEX) 6.73 % Apply 1 application topically 2 (two) times daily as needed (For legs).    . folic acid (FOLVITE) 1 MG tablet Take 1 tablet (1 mg total) by mouth daily.    Marland Kitchen glucose  blood (ONE TOUCH ULTRA TEST) test strip USE AS DIRECTED TO TEST BLOOD GLUCOSE  EVERY OTHER DAY DX: 250.00 50 each 2  . glucose blood (ONETOUCH VERIO) test strip Use as instructed to check blood sugar once daily. Dx Code E11.8 100 each 3  . ipratropium-albuterol (DUONEB) 0.5-2.5 (3) MG/3ML SOLN Take 3 mLs by nebulization 4 (four) times daily. 360 mL 3  . metFORMIN (GLUCOPHAGE-XR) 750 MG 24 hr tablet TAKE 2 TABLETS (1,500 MG TOTAL) BY MOUTH DAILY. 180 tablet 2  . NON FORMULARY BiPAP: At bedtime    . NON FORMULARY ONE TOUCH ULTRA TEST STRIPS  AND LANCETS    . ONETOUCH DELICA LANCETS FINE MISC Use to obtain a blood specimen every day Dx code E11.8 100 each 1  . PROAIR HFA 108 (90 Base)  MCG/ACT inhaler INHALE 2 PUFFS INTO THE LUNGS EVERY 6 HOURS AS NEEDED (FOR CHEST CONGESTION). 1 Inhaler 11  . torsemide (DEMADEX) 100 MG tablet Take 1 tablet (100 mg total) by mouth daily. 90 tablet 3  . warfarin (COUMADIN) 5 MG tablet Take 5 mg by mouth daily EXCEPT for two (2) days a week take 2.5 mg by mouth daily. Take as directed per Coumadin Clinic.    . fluocinonide cream (LIDEX) 8.11 % Apply 1 application topically 2 (two) times daily. (Patient not taking: Reported on 10/18/2017) 30 g 3  . warfarin (COUMADIN) 5 MG tablet TAKE AS DIRECTED BY COUMADIN CLINIC (Patient not taking: Reported on 10/18/2017) 90 tablet 1   No facility-administered medications prior to visit.      Allergies:   Rifampin and Vancomycin   Social History   Socioeconomic History  . Marital status: Married    Spouse name: Not on file  . Number of children: Not on file  . Years of education: Not on file  . Highest education level: Not on file  Occupational History  . Not on file  Social Needs  . Financial resource strain: Not on file  . Food insecurity:    Worry: Not on file    Inability: Not on file  . Transportation needs:    Medical: Not on file    Non-medical: Not on file  Tobacco Use  . Smoking status: Former Smoker    Packs/day: 1.00    Years: 30.00    Pack years: 30.00    Types: Cigarettes    Last attempt to quit: 04/03/1992    Years since quitting: 25.5  . Smokeless tobacco: Never Used  Substance and Sexual Activity  . Alcohol use: Yes    Comment: 4-6 ounces of wine daily  . Drug use: No    Comment: Half a cup a day.  Marland Kitchen Sexual activity: Not on file  Lifestyle  . Physical activity:    Days per week: Not on file    Minutes per session: Not on file  . Stress: Not on file  Relationships  . Social connections:    Talks on phone: Not on file    Gets together: Not on file    Attends religious service: Not on file    Active member of club or organization: Not on file    Attends meetings of  clubs or organizations: Not on file    Relationship status: Not on file  Other Topics Concern  . Not on file  Social History Narrative  . Not on file     Family History:  The patient's family history includes Cancer in his father; Hypertension in his mother.  ROS:   Please see the history of present illness.    Anorexia, lower extremity swelling with weeping skin lesions. All other systems reviewed and are negative.   PHYSICAL EXAM:   VS:  BP 102/70   Pulse 66   Ht 6' 1.5" (1.867 m)   Wt 299 lb 9.6 oz (135.9 kg)   BMI 38.99 kg/m    GEN: Chronically ill appearing.  Distended neck veins sitting at 90 degrees.  Oxygen cannula in place. HEENT: normal  Neck: Marked JVD with V waves refluxing to the jaw bilaterally.  No carotid bruits, or masses. Cardiac: IIRR; no murmurs, rubs, or gallop.  Mechanical aortic valve opening and closure sound.  No diastolic murmur is heard.  There is tense 3-4+ edema from ankles to the mid thigh bilaterally.  Presacral edema also noted. Respiratory:  clear to auscultation bilaterally, normal work of breathing.  Presacral edema.  A GI: soft, nontender, nondistended, + BS.  Ascites is present. MS: no deformity or atrophy  Skin: warm and dry, no rash Neuro:  Alert and Oriented x 3, Strength and sensation are intact Psych: euthymic mood, full affect  Wt Readings from Last 3 Encounters:  10/18/17 299 lb 9.6 oz (135.9 kg)  10/17/17 300 lb (136.1 kg)  10/15/17 299 lb (135.6 kg)      Studies/Labs Reviewed:   EKG:  EKG atrial fibrillation with nonspecific interventricular conduction delay Aug 01, 2017.  No more recent data available.  Low voltage is noted.  Recent Labs: 07/10/2017: Pro B Natriuretic peptide (BNP) 411.0 08/01/2017: B Natriuretic Peptide 784.4 08/03/2017: Magnesium 1.9 09/11/2017: Hemoglobin 10.0; Platelets 93.0 10/15/2017: ALT 13; BUN 32; Creatinine, Ser 1.40; Potassium 3.9; Sodium 142   Lipid Panel    Component Value Date/Time   CHOL 104  08/24/2017 0925   TRIG 68.0 08/24/2017 0925   HDL 50.80 08/24/2017 0925   CHOLHDL 2 08/24/2017 0925   VLDL 13.6 08/24/2017 0925   LDLCALC 40 08/24/2017 0925    Additional studies/ records that were reviewed today include:  2D Doppler echocardiogram February 07, 2017: Study Conclusions   - Left ventricle: Wall thickness was increased in a pattern of mild   LVH. Systolic function was normal. The estimated ejection   fraction was in the range of 55% to 60%. - Aortic valve: Normal appearing mechanical AVR with no peri   valvular regurgitation. - Mitral valve: Moderately calcified annulus. Moderately thickened   leaflets . There was moderate regurgitation. - Left atrium: The atrium was severely dilated. - Atrial septum: No defect or patent foramen ovale was identified.    ASSESSMENT:    1. Acute on chronic right-sided heart failure ([place])   2. Chronic diastolic heart failure ([place])   3. Chronic atrial fibrillation ([place])   4. Atherosclerosis of coronary artery bypass graft of native heart with stable angina pectoris ([place])   5. Essential hypertension   6. OSA (obstructive sleep apnea)   7. Type 2 diabetes mellitus with complication, without long-term current use of insulin ([place])   8. Long term current use of anticoagulant therapy      PLAN:  In order of problems listed above:  1. Biventricular failure with ascites and anasarca.  Greater than 50 pound increase in weight over the past 4 to 6 weeks despite escalation of diuretic therapy.  He has predominant right heart failure greater than left heart failure.  I have recommended that the patient be admitted to telemetry for IV diuresis, advanced heart failure team consult, and  an updated 2D Doppler echocardiogram to assess RV size and function.  We had discussion concerning prognosis.  He was informed that this presentation is a poor prognostic indicator.  We did discuss CODE STATUS.  States he does not want to be put on life support but  has not made a decision about DNR.  Further discussion will occur.  Discussion occurred in the presence of his wife.  We will check a TSH. 2. As noted above chronic heart failure is present.  LV systolic function will be reassessed with echo 3. Controlled rate on current medical therapy. 4. History of coronary bypass grafting.  No current symptoms suggest angina.  Will not check troponin on admission. 5. Low blood pressure secondary to diuresis.  Will adjust therapy on admission to allow diuresis. 6. Will need to wear CPAP during hospital stay. 7. Not addressed.   8. We will continue Coumadin anticoagulation long-term with.  Mechanical aortic valve.  Overall the patient is chronically ill.  He has more than 50 pounds volume overload recent diagnosis of COPD was made although I am not certain that he has this diagnosis.  Being admitted for diuresis to decrease symptoms.  Will need to have discussions concerning intensity of care, perhaps with palliative care service.     Signed, [name], MD  10/18/2017 1:10 PM    [facility] [place], [place], [place]  [address] Phone: [phone]; Fax: [phone]

## 2017-10-18 NOTE — Telephone Encounter (Signed)
Copied from [place] [phone]. Topic: Quick Communication - Rx Refill/Question >> Oct 18, 2017  1:05 PM Keene Breath wrote: Medication: ipratropium-albuterol (DUONEB) 0.5-2.5 (3) MG/3ML SOLN  Patient called to request refill for the above medication.  CB# [phone]  Preferred Pharmacy (with phone number or street name): [id number] - [place], [place] - [address] [phone] (Phone) [phone] (Fax)

## 2017-10-18 NOTE — Telephone Encounter (Signed)
Spoke with Truman Hayward at [facility] who states that prescription for Duoneb soln was not received. Prescription shows that it was printed on 10/17/17.

## 2017-10-18 NOTE — Progress Notes (Signed)
ANTICOAGULATION CONSULT NOTE - Initial Consult  Pharmacy Consult for Coumadin Indication: mechanical aortic valve + Afib  Allergies  Allergen Reactions  . Rifampin Rash    May have been caused by Vancomycin or Rifampin (??)  . Vancomycin Rash    May have been caused by Vancomycin or Rifampin (??)    Patient Measurements: Height: 6\' 2"  (188 cm) Weight: 299 lb 1.6 oz (135.7 kg) IBW/kg (Calculated) : 82.2 Heparin Dosing Weight: n/a  Vital Signs: Temp: 97.3 F (36.3 C) (07/18 1458) Temp Source: Oral (07/18 1458) BP: 114/73 (07/18 1458) Pulse Rate: 73 (07/18 1458)  Labs: Recent Labs    10/18/17 1510  HGB 9.8*  HCT 31.9*  PLT 75*  LABPROT 31.1*  INR 3.02    Estimated Creatinine Clearance: 61.7 mL/min (by C-G formula based on SCr of 1.4 mg/dL).   Medical History: Past Medical History:  Diagnosis Date  . Aortic stenosis   . Carotid artery disease ([place]) 1994   s/p left carotid endarerectomy   . CHF with unknown LVEF (HCC)    47 %  . Chronic atrial fibrillation ([place])   . Hx of CABG   . Hypertension   . Left ventricular dysfunction 2007   LVEF 47%   . OSA (obstructive sleep apnea)   . Rheumatic fever   . Subclavian bypass stenosis ([place])   . Vitamin B12 deficiency     Medications:  Scheduled:  . carvedilol  12.5 mg Oral BID WC  . ezetimibe-simvastatin  1 tablet Oral QHS  . [START ON 1/61/0960] folic acid  1 mg Oral Daily  . furosemide  80 mg Intravenous Q6H  . ipratropium-albuterol  3 mL Nebulization QID  . [START ON 10/19/2017] metFORMIN  1,500 mg Oral Q breakfast  . sodium chloride flush  3 mL Intravenous Q12H  . [START ON 10/19/2017] vitamin B-12  3,000 mcg Oral Daily  . warfarin  5 mg Oral ONCE-1800  . Warfarin - Pharmacist Dosing Inpatient   Does not apply q1800    Assessment: 82 yo male on chronic Coumadin for mechanical aortic valve + afib.  Today's INR at goal at 3.02.  PTA Coumadin dose 5 mg daily except 2.5 mg on Mon, Fri.  Has not had Coumadin  dose yet today.  Goal of Therapy:  INR 2.5-3.5 Monitor platelets by anticoagulation protocol: Yes   Plan:  1. Warfarin 5 mg x 1 tonight (c/w home dose). 2. Daily PT/INR.  Marguerite Olea, [facility] Clinical Pharmacist Phone [phone]  10/18/2017 4:17 PM

## 2017-10-18 NOTE — Telephone Encounter (Signed)
FYI  Copied from [place] [id number]. Topic: General - Other >> Oct 18, 2017  1:03 PM Keene Breath wrote: Reason for CRM: Patient's relative called to let doctor know that patient's heart doctor is admitting him into the hospital today.

## 2017-10-18 NOTE — Patient Instructions (Addendum)
Medication Instructions:  Your physician recommends that you continue on your current medications as directed. Please refer to the Current Medication list given to you today.  Labwork: None  Testing/Procedures: None  Follow-Up: Keep follow up with Dr. Tamala Julian as scheduled.   Any Other Special Instructions Will Be Listed Below (If Applicable).  I have contacted [facility] for admission.  They will contact you to come over once a bed is open.  If any changes in your symptoms please go to ER or contact the office.    If you need a refill on your cardiac medications before your next appointment, please call your pharmacy.

## 2017-10-18 NOTE — Telephone Encounter (Signed)
Noted. Thx.

## 2017-10-19 ENCOUNTER — Inpatient Hospital Stay (HOSPITAL_COMMUNITY): Payer: Medicare Other

## 2017-10-19 DIAGNOSIS — I50810 Right heart failure, unspecified: Secondary | ICD-10-CM

## 2017-10-19 DIAGNOSIS — Z7189 Other specified counseling: Secondary | ICD-10-CM

## 2017-10-19 DIAGNOSIS — Z515 Encounter for palliative care: Secondary | ICD-10-CM

## 2017-10-19 DIAGNOSIS — I5043 Acute on chronic combined systolic (congestive) and diastolic (congestive) heart failure: Secondary | ICD-10-CM

## 2017-10-19 DIAGNOSIS — R0602 Shortness of breath: Secondary | ICD-10-CM

## 2017-10-19 DIAGNOSIS — I361 Nonrheumatic tricuspid (valve) insufficiency: Secondary | ICD-10-CM

## 2017-10-19 LAB — BASIC METABOLIC PANEL
Anion gap: 11 (ref 5–15)
BUN: 30 mg/dL — AB (ref 8–23)
CALCIUM: 8.9 mg/dL (ref 8.9–10.3)
CO2: 31 mmol/L (ref 22–32)
CREATININE: 1.42 mg/dL — AB (ref 0.61–1.24)
Chloride: 99 mmol/L (ref 98–111)
GFR calc non Af Amer: 45 mL/min — ABNORMAL LOW (ref >60)
GFR, EST AFRICAN AMERICAN: 52 mL/min — AB (ref >60)
Glucose, Bld: 120 mg/dL — ABNORMAL HIGH (ref 70–99)
Potassium: 3.5 mmol/L (ref 3.5–5.1)
SODIUM: 141 mmol/L (ref 135–145)

## 2017-10-19 LAB — ECHOCARDIOGRAM COMPLETE
HEIGHTINCHES: 74 in
WEIGHTICAEL: 4768 [oz_av]

## 2017-10-19 LAB — GLUCOSE, CAPILLARY
GLUCOSE-CAPILLARY: 99 mg/dL (ref 70–99)
Glucose-Capillary: 124 mg/dL — ABNORMAL HIGH (ref 70–99)
Glucose-Capillary: 118 mg/dL — ABNORMAL HIGH (ref 70–99)
Glucose-Capillary: 127 mg/dL — ABNORMAL HIGH (ref 70–99)

## 2017-10-19 LAB — PROTIME-INR
INR: 3.04
PROTHROMBIN TIME: 31.3 s — AB (ref 11.4–15.2)

## 2017-10-19 MED ORDER — SPIRONOLACTONE 12.5 MG HALF TABLET
12.5000 mg | ORAL_TABLET | Freq: Every day | ORAL | Status: DC
  Administered 2017-10-19 – 2017-10-21 (×3): 12.5 mg via ORAL
  Filled 2017-10-19 (×4): qty 1

## 2017-10-19 MED ORDER — WARFARIN SODIUM 2.5 MG PO TABS
2.5000 mg | ORAL_TABLET | ORAL | Status: DC
  Administered 2017-10-19: 2.5 mg via ORAL
  Filled 2017-10-19: qty 1

## 2017-10-19 MED ORDER — IPRATROPIUM-ALBUTEROL 0.5-2.5 (3) MG/3ML IN SOLN
3.0000 mL | Freq: Three times a day (TID) | RESPIRATORY_TRACT | Status: DC
  Administered 2017-10-19 – 2017-10-22 (×12): 3 mL via RESPIRATORY_TRACT
  Filled 2017-10-19 (×12): qty 3

## 2017-10-19 MED ORDER — METOLAZONE 2.5 MG PO TABS
2.5000 mg | ORAL_TABLET | Freq: Once | ORAL | Status: AC
  Administered 2017-10-19: 2.5 mg via ORAL
  Filled 2017-10-19: qty 1

## 2017-10-19 MED ORDER — FUROSEMIDE 10 MG/ML IJ SOLN
15.0000 mg/h | INTRAVENOUS | Status: DC
  Administered 2017-10-19: 12 mg/h via INTRAVENOUS
  Administered 2017-10-20 – 2017-10-28 (×11): 15 mg/h via INTRAVENOUS
  Filled 2017-10-19 (×3): qty 25
  Filled 2017-10-19: qty 21
  Filled 2017-10-19 (×12): qty 25

## 2017-10-19 MED ORDER — POTASSIUM CHLORIDE CRYS ER 20 MEQ PO TBCR
40.0000 meq | EXTENDED_RELEASE_TABLET | Freq: Once | ORAL | Status: AC
  Administered 2017-10-19: 40 meq via ORAL
  Filled 2017-10-19: qty 2

## 2017-10-19 MED ORDER — WARFARIN SODIUM 5 MG PO TABS
5.0000 mg | ORAL_TABLET | ORAL | Status: DC
  Administered 2017-10-20 – 2017-10-21 (×2): 5 mg via ORAL
  Filled 2017-10-19 (×2): qty 1

## 2017-10-19 NOTE — Consult Note (Addendum)
Advanced Heart Failure Team Consult Note   Primary Physician: Cassandria Anger, MD PCP-Cardiologist:  [name], MD  Reason for Consultation: Acute on chronic combined CHF  HPI:    [name] is seen today for evaluation of acute on chronic diastolic CHF at the request of Dr. Tamala Julian.   Jaicob Pieroni is a 82 y.o. male with h/o mechanical AVR 1994, OSA on CPAP, Chronic afib, chronic combined CHF, CKD III, chronic coumadin therapy, CAD s/p CAGB 1994, and COPD.   Seen in [facility] clinic 10/18/17 and 50 lb weight gain noted since discharge from hospital 08/28/17. PCP had increased his diuretic with no increase in effect. He has also been on new O2 therapy since that admission. Noted in clinic to have abdominal distention, orthopnea, and painful lower extremities with multiple oozing lesions.   Pertinent labs inlclude K 3.5, Cr 1.4 (Baseline 1.3-1.5), BNP 1073, WBC 3.6, Hgb 9.8,  INR 3.04, and TSH 5.0.  CXR with mild CHF, probable small left pleural effusion.   He says he initially felt better after his admit for bronchitis in may, but has gradually felt worse since.  His weight has gradually trended up, to now a total of nearly 50 lbs. He has been more SOB with any activity, including his ADLs. He does have lightheadedness with rapid standing. He c/o edema in his legs, thighs, sides, and testicles along with abdominal distention. He has worsening orthopnea. Denies CP. He denies SOB at rest. PTA he was taking Torsemide 100 mg daily AND Bumex 4 mg BID per his PCP.   Echo 02/2017 LVEF 55-60%, normal appearing AVR, Mod MR, severe LAE.   Review of systems complete and found to be negative unless listed in HPI.    Home Medications Prior to Admission medications   Medication Sig Start Date End Date Taking? Authorizing Provider  acetaminophen (TYLENOL) 500 MG tablet Take 500-1,000 mg by mouth daily as needed (for pain).    Yes [provider]  carvedilol (COREG) 25 MG tablet Take  1 tablet (25 mg total) by mouth 2 (two) times daily with a meal. 04/12/17  Yes Belva Crome, MD  Cholecalciferol (VITAMIN D3) 2000 units capsule Take 1 capsule (2,000 Units total) by mouth daily. 04/05/17  Yes [name], [name], MD  Cyanocobalamin (VITAMIN B-12) 3000 MCG SUBL Take 3,000 mcg by mouth daily.    Yes [provider]  dextromethorphan-guaiFENesin (MUCINEX DM) 30-600 MG 12hr tablet Take 1 tablet by mouth 2 (two) times daily. 08/03/17  Yes Doreatha Lew, MD  Dietary Management Product [facility]) TABS Take 1 tablet by mouth daily. 04/05/17  Yes [name], [name], MD  ezetimibe-simvastatin (VYTORIN) 10-20 MG tablet Take 1 tablet by mouth at bedtime. 04/05/17  Yes [name], [name], MD  fluocinonide cream (LIDEX) 8.46 % Apply 1 application topically 2 (two) times daily as needed (For legs).   Yes [provider]  folic acid (FOLVITE) 1 MG tablet Take 1 tablet (1 mg total) by mouth daily. 12/02/14  Yes Brunetta Genera, MD  hydrocortisone cream 1 % Apply 1 application topically as needed for itching (for legs).   Yes [provider]  metFORMIN (GLUCOPHAGE-XR) 750 MG 24 hr tablet TAKE 2 TABLETS (1,500 MG TOTAL) BY MOUTH DAILY. 07/03/17  Yes Elayne Snare, MD  NON FORMULARY BiPAP: At bedtime   Yes [provider]  PROAIR HFA 108 (90 Base) MCG/ACT inhaler INHALE 2 PUFFS INTO THE LUNGS EVERY 6 HOURS AS NEEDED (  FOR CHEST CONGESTION). 10/16/17  Yes [name], [name], MD  torsemide (DEMADEX) 100 MG tablet Take 1 tablet (100 mg total) by mouth daily. 09/13/17  Yes [name], [name], MD  warfarin (COUMADIN) 5 MG tablet Take 5 mg by mouth daily EXCEPT for two (2) days a week take 2.5 mg by mouth daily on Monday and Friday. Take as directed per Coumadin Clinic.   Yes [provider]  bumetanide (BUMEX) 2 MG tablet Take 4 mg with Torsemide. Take another 4 mg in 4 hrs 10/17/17   [name], [name], MD  glucose blood (ONE TOUCH ULTRA TEST) test strip  USE AS DIRECTED TO TEST BLOOD GLUCOSE  EVERY OTHER DAY DX: 250.00 08/23/16   Elayne Snare, MD  glucose blood (ONETOUCH VERIO) test strip Use as instructed to check blood sugar once daily. Dx Code E11.8 05/07/17   Elayne Snare, MD  ipratropium-albuterol (DUONEB) 0.5-2.5 (3) MG/3ML SOLN Take 3 mLs by nebulization 4 (four) times daily. 10/17/17   [name], [name], MD  NON FORMULARY ONE TOUCH ULTRA TEST STRIPS  AND LANCETS    [provider]  [facility] DELICA LANCETS FINE MISC Use to obtain a blood specimen every day Dx code E11.8 05/07/17   Elayne Snare, MD    Past Medical History: Past Medical History:  Diagnosis Date  . Aortic stenosis   . Carotid artery disease ([place]) 1994   s/p left carotid endarerectomy   . CHF with unknown LVEF (HCC)    47 %  . Chronic atrial fibrillation ([place])   . Hx of CABG   . Hypertension   . Left ventricular dysfunction 2007   LVEF 47%   . OSA (obstructive sleep apnea)   . Rheumatic fever   . Subclavian bypass stenosis ([place])   . Vitamin B12 deficiency     Past Surgical History: Past Surgical History:  Procedure Laterality Date  . [place]   replaced due to aortic stenosis, [employer] mechanical prostesis  . CAROTID ENDARTERECTOMY Left 1997   subclavian bypass Done in [place]  . CORONARY ARTERY BYPASS GRAFT  1994   w SVG to RCA and SVG to circumflex  . heart bypass     Done in [place]    Family History: Family History  Problem Relation Age of Onset  . Cancer Father        lung   . Hypertension Mother     Social History: Social History   Socioeconomic History  . Marital status: Married    Spouse name: Not on file  . Number of children: Not on file  . Years of education: Not on file  . Highest education level: Not on file  Occupational History  . Not on file  Social Needs  . Financial resource strain: Not on file  . Food insecurity:    Worry: Not on file    Inability: Not on file  . Transportation needs:     Medical: Not on file    Non-medical: Not on file  Tobacco Use  . Smoking status: Former Smoker    Packs/day: 1.00    Years: 30.00    Pack years: 30.00    Types: Cigarettes    Last attempt to quit: 04/03/1992    Years since quitting: 25.5  . Smokeless tobacco: Never Used  Substance and Sexual Activity  . Alcohol use: Yes    Comment: 4-6 ounces of wine daily  . Drug use: No    Comment: Half a  cup a day.  Marland Kitchen Sexual activity: Not on file  Lifestyle  . Physical activity:    Days per week: Not on file    Minutes per session: Not on file  . Stress: Not on file  Relationships  . Social connections:    Talks on phone: Not on file    Gets together: Not on file    Attends religious service: Not on file    Active member of club or organization: Not on file    Attends meetings of clubs or organizations: Not on file    Relationship status: Not on file  Other Topics Concern  . Not on file  Social History Narrative  . Not on file    Allergies:  Allergies  Allergen Reactions  . Rifampin Rash    May have been caused by Vancomycin or Rifampin (??)  . Vancomycin Rash    May have been caused by Vancomycin or Rifampin (??)    Objective:    Vital Signs:   Temp:  [97.3 F (36.3 C)-98.5 F (36.9 C)] 98.4 F (36.9 C) (07/19 0759) Pulse Rate:  [63-73] 63 (07/19 0759) Resp:  [18-20] 18 (07/19 0759) BP: (95-139)/(57-73) 139/70 (07/19 0759) SpO2:  [91 %-97 %] 94 % (07/19 0810) FiO2 (%):  [99 %] 99 % (07/18 1639) Weight:  [298 lb (135.2 kg)-299 lb 1.6 oz (135.7 kg)] 298 lb (135.2 kg) (07/19 0515) Last BM Date: 10/18/17  Weight change: Filed Weights   10/18/17 1458 10/19/17 0515  Weight: 299 lb 1.6 oz (135.7 kg) 298 lb (135.2 kg)    Intake/Output:   Intake/Output Summary (Last 24 hours) at 10/19/2017 1137 Last data filed at 10/19/2017 0936 Gross per 24 hour  Intake 793 ml  Output 725 ml  Net 68 ml      Physical Exam    General:  Chronically ill and elderly appearing.    HEENT: normal Neck: supple. JVP 14 cm+. Carotids 2+ bilat; no bruits. No lymphadenopathy or thyromegaly appreciated. Cor: PMI nondisplaced. Irregularly irregular. Mechanical AVR opening and closing sound.  Lungs: Diminished throughout.  Abdomen: soft, nontender, nondistended. No hepatosplenomegaly. No bruits or masses. Good bowel sounds. Extremities: no cyanosis or clubbing. Chronic, woody 3-4+ edema to thighs. Edematous flanks noted.  Neuro: alert & orientedx3, cranial nerves grossly intact. moves all 4 extremities w/o difficulty. Affect pleasant  Telemetry   Afib 60-70s (chronic) personally reviewed.   EKG    Afib 60-70s, personally reviewed.   Labs   Basic Metabolic Panel: Recent Labs  Lab 10/15/17 0917 10/18/17 1510 10/19/17 0521  NA 142 143 141  K 3.9 3.3* 3.5  CL 99 99 99  CO2 30 30 31   GLUCOSE 111* 126* 120*  BUN 32* 31* 30*  CREATININE 1.40 1.47* 1.42*  CALCIUM 9.3 8.9 8.9  MG  --  1.7  --     Liver Function Tests: Recent Labs  Lab 10/15/17 0917 10/18/17 1510  AST 25 29  ALT 13 15  ALKPHOS 153* 146*  BILITOT 1.8* 2.0*  PROT 6.6 6.4*  ALBUMIN 3.6 3.2*   No results for input(s): LIPASE, AMYLASE in the last 168 hours. No results for input(s): AMMONIA in the last 168 hours.  CBC: Recent Labs  Lab 10/18/17 1510  WBC 3.6*  NEUTROABS 2.6  HGB 9.8*  HCT 31.9*  MCV 111.1*  PLT 75*    Cardiac Enzymes: No results for input(s): CKTOTAL, CKMB, CKMBINDEX, TROPONINI in the last 168 hours.  BNP: BNP (last 3 results) Recent  Labs    08/01/17 1804 10/18/17 1510  BNP 784.4* 1,073.3*    ProBNP (last 3 results) Recent Labs    03/30/17 1147 05/25/17 1044 07/10/17 1523  PROBNP 1,858* 2,749* 411.0*     CBG: Recent Labs  Lab 10/18/17 1619 10/18/17 2127 10/19/17 0737  GLUCAP 116* 111* 124*    Coagulation Studies: Recent Labs    10/18/17 1510 10/19/17 0521  LABPROT 31.1* 31.3*  INR 3.02 3.04     Imaging   Dg Chest 2 View  Result  Date: 10/18/2017 CLINICAL DATA:  Evaluate for congestive heart failure EXAM: CHEST - 2 VIEW COMPARISON:  Aug 01, 2017 FINDINGS: The mediastinal contour is normal. The heart size is enlarged. Patient status post prior median sternotomy. There is pulmonary edema. There is probably a small left pleural effusion. No focal pneumonia is identified. The bony structures are stable. IMPRESSION: Mild congestive heart failure. Probable small left pleural effusion. Electronically Signed   By: Abelardo Diesel M.D.   On: 10/18/2017 19:02      Medications:     Current Medications: . carvedilol  12.5 mg Oral BID WC  . ezetimibe-simvastatin  1 tablet Oral QHS  . folic acid  1 mg Oral Daily  . furosemide  80 mg Intravenous Q6H  . insulin aspart  0-15 Units Subcutaneous TID WC  . ipratropium-albuterol  3 mL Nebulization TID  . sodium chloride flush  3 mL Intravenous Q12H  . vitamin B-12  3,000 mcg Oral Daily  . warfarin  2.5 mg Oral Once per day on Mon Fri  . [START ON 10/20/2017] warfarin  5 mg Oral Once per day on Sun Tue Wed Thu Sat  . Warfarin - Pharmacist Dosing Inpatient   Does not apply q1800     Infusions: . sodium chloride       Patient Profile   Stafford Holt is a 82 y.o. male with h/o mechanical AVR 1994, OSA on CPAP, Chronic afib, chronic combined CHF, CKD III, chronic coumadin therapy, CAD s/p CAGB 1994, and COPD.   Admitted from [facility] office 10/18/17 with Acute on chronic diastolic CHF.   Assessment/Plan   1. Acute on chronic diastolic CHF - Echo 41/6606 LVEF 55-60%, normal appearing AVR, Mod MR, severe LAE.   - Repeat Echo pending.  - Volume status markedly elevated  May need PICC line for CVP/Coox. ? RV failure.  - Continue lasix 80 mg IV q 6 hrs for now. May need to consider lasix gtt.  - May need to consider metolazone.  - Add spiro 12.5 mg daily.   2. Chronic Afib - Rate controlled - Continue coumadin. Dosing per pharmacy. INR 3.04 today.   3. CAD s/p CABG - No s/s of  ischemia.    - Continue ASA and statin  4. OSA on CPAP - Continue CPAP qhs.   5. Mechanical AVR: Goal INR 2.5-3.5 for afib/mechanical AVR.  - Continue coumadin - Repeat Echo pending.   6. Disposition - With age and co-morbidities, GOC discussion started by [facility] in clinic 10/18/17 - Palliative care has been consulted.   Medication concerns reviewed with patient and pharmacy team. Barriers identified: None at this time.   Length of Stay: 1  Annamaria Helling  10/19/2017, 11:37 AM  Advanced Heart Failure Team Pager [phone] (M-F; 7a - 4p)  Please contact [facility] for night-coverage after hours (4p -7a ) and weekends on [email or web address]   Patient seen with PA, agree with the above note.  I reviewed today's echo: EF 55-65%, mechanical aortic valve appears to be functioning normally, mild mitral stenosis/severe mitral regurgitation, RV moderately dilated with moderately decreased systolic function, PASP 49 mmHg.   He is admitted with exertional dyspnea and 50 lb weight gain over a couple months.  At baseline, he is on home oxygen for COPD and uses CPAP.   1. Acute on chronic diastolic CHF with prominent RV failure: He is markedly volume overloaded on exam.  He also was thought to have severe MR by echo today which is could be contributing to the significant RV failure. Creatinine is stable today but not diuresing much.  - Transition to Lasix 12 mg/hr + metolazone 2.5 x 1 now.  2. Mitral valve disease: Mild mitral stenosis, severe MR on echo today.  Will need closer evaluation by TEE when he is better-diuresed.  If MR is severe, would consider Mitraclip, but if there is truly mild mitral stenosis he may not be a good candidate.  3. Mechanical aortic valve: Appears to function well on today's echo.  - Continue warfarin with INR goal 2.5-3.5 (give RFs of CHF and chronic afib).  4. Atrial fibrillation: Chronic.  - Continue Coreg 12.5 mg bid, HR is controlled.  - Continue  warfarin.  5. CAD: s/p CABG.  No chest pain.  - Continue Vytorin.  6. COPD: On home oxygen.  7. OSA: Continue home CPAP.   Loralie Champagne 10/19/2017 2:47 PM

## 2017-10-19 NOTE — Progress Notes (Signed)
  Echocardiogram 2D Echocardiogram has been performed.  [name] 10/19/2017, 12:46 PM

## 2017-10-19 NOTE — Evaluation (Signed)
Physical Therapy Evaluation Patient Details Name: [name] MRN: [id number] DOB: [birth year] Today's Date: 10/19/2017   History of Present Illness  82 y.o. male who presents for with mechanical aortic valve 1994, obstructive sleep apnea on chronic CPAP, chronic atrial fibrillation, chronic combined systolic and diastolic heart failure, chronic stage III CKD, Coumadin therapy, prior CABG 1994, and recently diagnosed with COPD and placed on chronic oxygen therapy by hospitalist during an early June 2019 admission Progressive abdominal swelling, lower extremity swelling with anasarca over the past 6 weeks  Clinical Impression  Pt admitted with above diagnosis. Pt currently with functional limitations due to oxygen desaturation (see General Comments), and generalized weakness. Pt currently min A for bed mobility, transfers and ambulation of 20 feet with RW. Pt limited by deficits listed below (see PT Problem List). Pt will benefit from skilled PT to increase their independence and safety with mobility to allow discharge to the venue listed below.       Follow Up Recommendations Home health PT;Supervision/Assistance - 24 hour    Equipment Recommendations  None recommended by PT    Recommendations for Other Services OT consult     Precautions / Restrictions Precautions Precautions: Fall Precaution Comments: 2 months ago tripped on throw rug, all throw rugs gone from home Restrictions Weight Bearing Restrictions: No      Mobility  Bed Mobility Overal bed mobility: Needs Assistance Bed Mobility: Supine to Sit     Supine to sit: Min assist     General bed mobility comments: minA for HHA to pull against as he could not reach bedrail, able to scoot himself to EoB, c/o dizziness, dissipated quickly  Transfers Overall transfer level: Needs assistance Equipment used: None;Rolling walker (2 wheeled) Transfers: Sit to/from Stand Sit to Stand: Min assist;From elevated surface          General transfer comment: attempted from regular bed height unable to achieve full upright, with bed elevated requires minA for steadying to RW, c/o dizziness, dissipated quickly  Ambulation/Gait Ambulation/Gait assistance: Min assist Gait Distance (Feet): 20 Feet Assistive device: Rolling walker (2 wheeled) Gait Pattern/deviations: Step-through pattern;Shuffle;Decreased stride length;Trunk flexed Gait velocity: slowed Gait velocity interpretation: <1.31 ft/sec, indicative of household ambulator General Gait Details: minA for steadying with RW, c/o of dizziness/instability with 10 feet of ambulation, dissipated when seated in recliner        Balance Overall balance assessment: Needs assistance Sitting-balance support: Feet supported;No upper extremity supported Sitting balance-Leahy Scale: Fair     Standing balance support: During functional activity;No upper extremity supported;Bilateral upper extremity supported Standing balance-Leahy Scale: Fair Standing balance comment: more stable with RW support                             Pertinent Vitals/Pain Pain Assessment: No/denies pain    Home Living Family/patient expects to be discharged to:: Private residence Living Arrangements: Spouse/significant other Available Help at Discharge: Family;Available 24 hours/day Type of Home: House Home Access: Stairs to enter Entrance Stairs-Rails: None Entrance Stairs-Number of Steps: 1 Home Layout: Two level;Able to live on main level with bedroom/bathroom Home Equipment: Grab bars - toilet;Walker - 4 wheels      Prior Function Level of [place]: Needs assistance   Gait / Transfers Assistance Needed: uses Rollator limited community distances, to get to [employer] appointments,   ADL's / Homemaking Assistance Needed: wife assists with putting socks on         Hand Dominance  Dominant Hand: Right    Extremity/Trunk Assessment   Upper Extremity Assessment Upper  Extremity Assessment: Generalized weakness    Lower Extremity Assessment Lower Extremity Assessment: RLE deficits/detail;LLE deficits/detail RLE Deficits / Details: hip and knee ROM limited by body habitus and edema, proximal to distal weakness, grossly assessed hip flex 3/5, knee 3+/5, ankle 4/5 RLE Sensation: WNL RLE Coordination: decreased fine motor LLE Deficits / Details: hip and knee ROM limited by body habitus and edema, proximal to distal weakness, grossly assessed hip flex 3/5, knee 3+/5, ankle 4/5 LLE Sensation: WNL LLE Coordination: decreased fine motor       Communication   Communication: No difficulties  Cognition Arousal/Alertness: Awake/alert Behavior During Therapy: WFL for tasks assessed/performed Overall Cognitive Status: Within Functional Limits for tasks assessed                                        General Comments General comments (skin integrity, edema, etc.): on entry pt on 3L O2 via nasal cannula, SaO2 83%, increased to 4L O2 and instructed in pursed lipped breathing SaO2 rebounded to 91%O2, ambulated and SaO2 increased to 96% O2 initially and then dropped to 87%O2 by end of ambulation, once seated in chair SaO2 returned to 91%O2, pt left on 4L O2, RN notified         Assessment/Plan    PT Assessment Patient needs continued PT services  PT Problem List Decreased strength;Decreased range of motion;Decreased activity tolerance;Decreased balance;Decreased mobility;Decreased coordination;Cardiopulmonary status limiting activity       PT Treatment Interventions DME instruction;Gait training;Functional mobility training;Therapeutic activities;Therapeutic exercise;Balance training;Cognitive remediation;Patient/family education;Stair training    PT Goals (Current goals can be found in the Care Plan section)  Acute Rehab PT Goals Patient Stated Goal: feel better  PT Goal Formulation: With patient Time For Goal Achievement: 11/02/17 Potential  to Achieve Goals: Fair    Frequency Min 3X/week    AM-PAC PT "6 Clicks" Daily Activity  Outcome Measure Difficulty turning over in bed (including adjusting bedclothes, sheets and blankets)?: A Little Difficulty moving from lying on back to sitting on the side of the bed? : Unable Difficulty sitting down on and standing up from a chair with arms (e.g., wheelchair, bedside commode, etc,.)?: Unable Help needed moving to and from a bed to chair (including a wheelchair)?: A Little Help needed walking in hospital room?: A Little Help needed climbing 3-5 steps with a railing? : A Lot 6 Click Score: 13    End of Session Equipment Utilized During Treatment: Gait belt;Oxygen Activity Tolerance: Treatment limited secondary to medical complications (Comment)(limited by O2 desaturation ) Patient left: in chair;with call bell/phone within reach;with chair alarm set Nurse Communication: Mobility status PT Visit Diagnosis: Unsteadiness on feet (R26.81);Other abnormalities of gait and mobility (R26.89);Muscle weakness (generalized) (M62.81);History of falling (Z91.81);Difficulty in walking, not elsewhere classified (R26.2);Dizziness and giddiness (R42)    Time: 7062-3762 PT Time Calculation (min) (ACUTE ONLY): 30 min   Charges:   PT Evaluation $PT Eval Moderate Complexity: 1 Mod PT Treatments $Gait Training: 8-22 mins   PT G Codes:        [name] B. Migdalia Dk PT, DPT Acute Rehabilitation  [phone] Pager [phone]    [name] 10/19/2017, 9:41 AM

## 2017-10-19 NOTE — Care Management Note (Signed)
Case Management Note  Patient Details  Name: [name] MRN: [id number] Date of Birth: 1935/06/09  Subjective/Objective:    CHF               Action/Plan: Patient lives at home with spouse; PCP:  [name], [name], MD; has private insurance with Medicare  / BCBS with prescription drug coverage; [facility] choice [facility]; he states that he does not have any problem getting his medication and his pharmacy will deliver if needed; He does not drive, his wife takes him to his apts; DME - home oxygen; has scales at home and knows to weigh himself daily; Patient could benefit from a Disease Management program for CHF - noted admitted x 2 in 6 months; [place] choice offered, pt/ spouse chose [place]; Dan with [facility] called for arrangements; pt is also requesting a Rollater ( rolling walker with wheels) and a 3:1; CM will continue to follow for progression of care.  Expected Discharge Date:    possibly 10/22/2017              Expected Discharge Plan:  [place]  In-House Referral:   [facility] program for CHF  Discharge planning Services  CM Consult  Choice offered to:  Patient  DME Arranged:  Walker rolling with seat, 3-N-1 DME Agency:  [place] Arranged:  RN, Disease Management, PT [place] Agency:  [place]  Status of Service:  In process, will continue to follow  Sherrilyn Rist [phone] 10/19/2017, 10:17 AM

## 2017-10-19 NOTE — Telephone Encounter (Signed)
Faxed to pharmacy

## 2017-10-19 NOTE — Plan of Care (Signed)
  Problem: Activity: Goal: Capacity to carry out activities will improve Outcome: Progressing   Problem: Education: Goal: Ability to verbalize understanding of medication therapies will improve Outcome: Progressing   

## 2017-10-19 NOTE — Consult Note (Signed)
Consultation Note Date: 10/19/2017   Patient Name: [name]  DOB: 05/23/1935  MRN: [id number]  Age / Sex: 82 y.o., male  PCP: Beard, Andrew Lacks, MD Referring Physician: Belva Crome, MD  Reason for Consultation: Establishing goals of care and Psychosocial/spiritual support  HPI/Patient Profile: 82 y.o. male  with past medical history of aortic valve replacement 1994, CABG 1994, left endarterectomy, combined congestive heart failure, obstructive sleep apnea on CPAP, COPD (started oxygen in 2019) admitted on 10/18/2017 with progressive anasarca; 50 pound weight gain.  Patient has been started on IV Lasix.  Consult ordered for goals of care discussion.  Per chart review, goals of care discussions were initiated in outpatient office.   Clinical Assessment and Goals of Care: Met with patient and patient's wife, chart reviewed.  Patient shares that over the past several months he has been getting progressively more edematous and taking on fluid which has made it difficult to walk, as well as increased shortness of breath with any sort of exertion.  He is relatively comfortable at rest but states his legs feel so heavy that he needs help to move them.  He has been started on IV Lasix.  Per patient and patient's spouse, after speaking with Dr. Tamala Julian, with whom they have a long-standing, good relationship with, feel optimistic that excess volume will improve.  They shared that they have been told that there might be dysfunction now with his mitral valve in addition to his heart failure that could be contributing to this progressive anasarca.  Patient has been through cardiac interventions as noted in 1994.  He verbalizes that he would just have to "weigh the pros and cons" if approached by further surgery.  I did introduce palliative medicine services as an additional resource and source of support for he and his  family while in the hospital.  We also talked in general about advanced directives, types of questions that people are asked in the hospital setting such as CODE STATUS as well as evaluating risks and benefits of medical interventions.  Patient reiterated that he is a full code by choice at this point but would not want to live on life support and would not pursue a trach if he were to find himself on a ventilator and unable to breathe on his own  Patient at this point is able to participate in his own goals of care discussion.  Where he unable to, his wife, [name] at [phone], would be his healthcare proxy    SUMMARY OF RECOMMENDATIONS   Patient is full code by choice; continue full scope of treatment Patient is hopeful for improvement in terms of anasarca and then will evaluate options in conjunction with Dr. Tamala Julian his cardiologist about how to proceed forward Hard choices for [facility] as well as MOST form left with patient and spouse as a guide to the types of questions and interventions that some people face in the setting of acute illness Palliative medicine to stay involved and see how patients clinical  progress proceeds in terms of diuresis and what next steps are in terms of managing anasarca Code Status/Advance Care Planning:  Full code    Symptom Management:   Dyspnea: Continue with Lasix infusion at 80 mg every 6 hours, targeted pulmonary treatments with oxygen and nebulizer treatments as well as CPAP at night  Palliative Prophylaxis:   Aspiration, Bowel Regimen, Eye Care, Frequent Pain Assessment, Oral Care and Turn Reposition  Additional Recommendations (Limitations, Scope, Preferences):  Full Scope Treatment  Psycho-social/Spiritual:   Desire for further Chaplaincy support:no  Additional Recommendations: Referral to Community Resources   Prognosis:   Unable to determine  Discharge Planning: To Be Determined      Primary Diagnoses: Present on  Admission: . Atrial fibrillation ([place]) . Chronic diastolic heart failure ([place]) . Essential hypertension . Type II diabetes mellitus with complication ([place]) . OSA (obstructive sleep apnea) . Coronary atherosclerosis . Acute on chronic combined systolic and diastolic ACC/AHA stage C congestive heart failure ([place])   I have reviewed the medical record, interviewed the patient and family, and examined the patient. The following aspects are pertinent.  Past Medical History:  Diagnosis Date  . Aortic stenosis   . Carotid artery disease ([place]) 1994   s/p left carotid endarerectomy   . CHF with unknown LVEF ([facility])    47 %  . Chronic atrial fibrillation ([place])   . Hx of CABG   . Hypertension   . Left ventricular dysfunction 2007   LVEF 47%   . OSA (obstructive sleep apnea)   . Rheumatic fever   . Subclavian bypass stenosis ([place])   . Vitamin B12 deficiency    Social History   Socioeconomic History  . Marital status: Married    Spouse name: Not on file  . Number of children: Not on file  . Years of education: Not on file  . Highest education level: Not on file  Occupational History  . Not on file  Social Needs  . Financial resource strain: Not on file  . Food insecurity:    Worry: Not on file    Inability: Not on file  . Transportation needs:    Medical: Not on file    Non-medical: Not on file  Tobacco Use  . Smoking status: Former Smoker    Packs/day: 1.00    Years: 30.00    Pack years: 30.00    Types: Cigarettes    Last attempt to quit: 04/03/1992    Years since quitting: 25.5  . Smokeless tobacco: Never Used  Substance and Sexual Activity  . Alcohol use: Yes    Comment: 4-6 ounces of wine daily  . Drug use: No    Comment: Half a cup a day.  Marland Kitchen Sexual activity: Not on file  Lifestyle  . Physical activity:    Days per week: Not on file    Minutes per session: Not on file  . Stress: Not on file  Relationships  . Social connections:    Talks on phone: Not on file     Gets together: Not on file    Attends religious service: Not on file    Active member of club or organization: Not on file    Attends meetings of clubs or organizations: Not on file    Relationship status: Not on file  Other Topics Concern  . Not on file  Social History Narrative  . Not on file   Family History  Problem Relation Age of Onset  . Cancer Father  lung   . Hypertension Mother    Scheduled Meds: . carvedilol  12.5 mg Oral BID WC  . ezetimibe-simvastatin  1 tablet Oral QHS  . folic acid  1 mg Oral Daily  . insulin aspart  0-15 Units Subcutaneous TID WC  . ipratropium-albuterol  3 mL Nebulization TID  . sodium chloride flush  3 mL Intravenous Q12H  . spironolactone  12.5 mg Oral Daily  . vitamin B-12  3,000 mcg Oral Daily  . warfarin  2.5 mg Oral Once per day on Mon Fri  . [START ON 10/20/2017] warfarin  5 mg Oral Once per day on Sun Tue Wed Thu Sat  . Warfarin - Pharmacist Dosing Inpatient   Does not apply q1800   Continuous Infusions: . sodium chloride    . furosemide (LASIX) infusion 12 mg/hr (10/19/17 1558)   PRN Meds:.sodium chloride, acetaminophen, fluocinonide cream, ondansetron (ZOFRAN) IV, sodium chloride flush Medications Prior to Admission:  Prior to Admission medications   Medication Sig Start Date End Date Taking? Authorizing Provider  acetaminophen (TYLENOL) 500 MG tablet Take 500-1,000 mg by mouth daily as needed (for pain).    Yes [provider]  carvedilol (COREG) 25 MG tablet Take 1 tablet (25 mg total) by mouth 2 (two) times daily with a meal. 04/12/17  Yes Andrew Crome, MD  Cholecalciferol (VITAMIN D3) 2000 units capsule Take 1 capsule (2,000 Units total) by mouth daily. 04/05/17  Yes Beard, Andrew Lacks, MD  Cyanocobalamin (VITAMIN B-12) 3000 MCG SUBL Take 3,000 mcg by mouth daily.    Yes [provider]  dextromethorphan-guaiFENesin (MUCINEX DM) 30-600 MG 12hr tablet Take 1 tablet by mouth 2 (two) times daily. 08/03/17  Yes  Doreatha Lew, MD  Dietary Management Product [facility]) TABS Take 1 tablet by mouth daily. 04/05/17  Yes Beard, Andrew Lacks, MD  ezetimibe-simvastatin (VYTORIN) 10-20 MG tablet Take 1 tablet by mouth at bedtime. 04/05/17  Yes Beard, Andrew Lacks, MD  fluocinonide cream (LIDEX) 0.93 % Apply 1 application topically 2 (two) times daily as needed (For legs).   Yes [provider]  folic acid (FOLVITE) 1 MG tablet Take 1 tablet (1 mg total) by mouth daily. 12/02/14  Yes Brunetta Genera, MD  hydrocortisone cream 1 % Apply 1 application topically as needed for itching (for legs).   Yes [provider]  metFORMIN (GLUCOPHAGE-XR) 750 MG 24 hr tablet TAKE 2 TABLETS (1,500 MG TOTAL) BY MOUTH DAILY. 07/03/17  Yes Elayne Snare, MD  NON FORMULARY BiPAP: At bedtime   Yes [provider]  PROAIR HFA 108 (90 Base) MCG/ACT inhaler INHALE 2 PUFFS INTO THE LUNGS EVERY 6 HOURS AS NEEDED (FOR CHEST CONGESTION). 10/16/17  Yes Beard, Andrew Lacks, MD  torsemide (DEMADEX) 100 MG tablet Take 1 tablet (100 mg total) by mouth daily. 09/13/17  Yes Beard, Andrew Lacks, MD  warfarin (COUMADIN) 5 MG tablet Take 5 mg by mouth daily EXCEPT for two (2) days a week take 2.5 mg by mouth daily on Monday and Friday. Take as directed per [facility].   Yes [provider]  bumetanide (BUMEX) 2 MG tablet Take 4 mg with Torsemide. Take another 4 mg in 4 hrs 10/17/17   Beard, Andrew Lacks, MD  glucose blood (ONE TOUCH ULTRA TEST) test strip USE AS DIRECTED TO TEST BLOOD GLUCOSE  EVERY OTHER DAY DX: 250.00 08/23/16   Elayne Snare, MD  glucose blood (ONETOUCH VERIO) test strip Use as instructed to check blood sugar once daily.  Dx Code E11.8 05/07/17   Elayne Snare, MD  ipratropium-albuterol (DUONEB) 0.5-2.5 (3) MG/3ML SOLN Take 3 mLs by nebulization 4 (four) times daily. 10/17/17   Beard, Andrew Lacks, MD  NON FORMULARY ONE TOUCH ULTRA TEST STRIPS  AND LANCETS    [provider]  [facility]  DELICA LANCETS FINE MISC Use to obtain a blood specimen every day Dx code E11.8 05/07/17   Elayne Snare, MD   Allergies  Allergen Reactions  . Rifampin Rash    May have been caused by Vancomycin or Rifampin (??)  . Vancomycin Rash    May have been caused by Vancomycin or Rifampin (??)   Review of Systems  Constitutional: Positive for activity change, fatigue and unexpected weight change.  HENT: Negative.   Eyes: Negative.   Respiratory: Positive for cough and shortness of breath.   Cardiovascular: Positive for leg swelling.  Gastrointestinal: Positive for abdominal distention.  Endocrine: Negative.   Genitourinary: Negative.   Musculoskeletal: Negative.   Skin: Negative.   Allergic/Immunologic: Negative.   Neurological: Positive for weakness.  Hematological: Bruises/bleeds easily.  Psychiatric/Behavioral: Negative.     Physical Exam  Constitutional: He is oriented to person, place, and time. He appears well-developed and well-nourished.  Anasarca  HENT:  Head: Normocephalic and atraumatic.  Pulmonary/Chest:  No increased work of breathing at rest Dry, nonproductive cough  Abdominal: He exhibits distension.  Musculoskeletal:  Decreased range of motion due to excessive fluid, edema to lower extremities  Neurological: He is alert and oriented to person, place, and time.  Skin: Skin is warm and dry.  Psychiatric: He has a normal mood and affect. His behavior is normal. Judgment and thought content normal.  Nursing note and vitals reviewed.   Vital Signs: BP (!) 109/58 (BP Location: Right Arm)   Pulse 64   Temp 98.2 F (36.8 C) (Oral)   Resp 18   Ht 6' 2"  (1.88 m)   Wt 135.2 kg (298 lb)   SpO2 99%   BMI 38.26 kg/m  Pain Scale: 0-10   Pain Score: 0-No pain   SpO2: SpO2: 99 % O2 Device:SpO2: 99 % O2 Flow Rate: .O2 Flow Rate (L/min): 3 L/min  IO: Intake/output summary:   Intake/Output Summary (Last 24 hours) at 10/19/2017 1700 Last data filed at 10/19/2017  1641 Gross per 24 hour  Intake 863 ml  Output 1850 ml  Net -987 ml    LBM: Last BM Date: 10/18/17 Baseline Weight: Weight: 135.7 kg (299 lb 1.6 oz) Most recent weight: Weight: 135.2 kg (298 lb)     Palliative Assessment/Data:   Flowsheet Rows     Most Recent Value  Intake Tab  Referral Department  Cardiology  Unit at Time of Referral  Cardiac/Telemetry Unit  Palliative Care Primary Diagnosis  Cardiac  Date Notified  10/18/17  Palliative Care Type  New Palliative care  Reason for referral  Clarify Goals of Care, Psychosocial or Spiritual support  Date of Admission  10/18/17  Date first seen by Palliative Care  10/19/17  # of days Palliative referral response time  1 Day(s)  # of days IP prior to Palliative referral  0  Clinical Assessment  Palliative Performance Scale Score  50%  Pain Max last 24 hours  Not able to report  Pain Min Last 24 hours  Not able to report  Dyspnea Max Last 24 Hours  Not able to report  Dyspnea Min Last 24 hours  Not able to report  Nausea Max Last 24 Hours  Not able to report  Nausea Min Last 24 Hours  Not able to report  Anxiety Max Last 24 Hours  Not able to report  Anxiety Min Last 24 Hours  Not able to report  Other Max Last 24 Hours  Not able to report  Psychosocial & Spiritual Assessment  Palliative Care Outcomes  Patient/Family meeting held?  Yes  Who was at the meeting?  pt, spouse  Palliative Care follow-up planned  Yes, Facility      Time In: 1600 Time Out: 1700 Time Total: 60 min Greater than 50%  of this time was spent counseling and coordinating care related to the above assessment and plan.  Signed by: Dory Horn, NP   Please contact Palliative Medicine Team phone at [phone] for questions and concerns.  For individual provider: See Shea Evans

## 2017-10-19 NOTE — Progress Notes (Signed)
ANTICOAGULATION CONSULT NOTE - Follow-up Consult  Pharmacy Consult for Coumadin Indication: mechanical aortic valve + Afib  Allergies  Allergen Reactions  . Rifampin Rash    May have been caused by Vancomycin or Rifampin (??)  . Vancomycin Rash    May have been caused by Vancomycin or Rifampin (??)    Patient Measurements: Height: 6\' 2"  (188 cm) Weight: 298 lb (135.2 kg) IBW/kg (Calculated) : 82.2 Heparin Dosing Weight: n/a  Vital Signs: Temp: 98.4 F (36.9 C) (07/19 0759) Temp Source: Oral (07/19 0759) BP: 139/70 (07/19 0759) Pulse Rate: 63 (07/19 0759)  Labs: Recent Labs    10/18/17 1510 10/19/17 0521  HGB 9.8*  --   HCT 31.9*  --   PLT 75*  --   LABPROT 31.1* 31.3*  INR 3.02 3.04  CREATININE 1.47* 1.42*    Estimated Creatinine Clearance: 60.7 mL/min (A) (by C-G formula based on SCr of 1.42 mg/dL (H)).   Medical History: Past Medical History:  Diagnosis Date  . Aortic stenosis   . Carotid artery disease ([place]) 1994   s/p left carotid endarerectomy   . CHF with unknown LVEF (HCC)    47 %  . Chronic atrial fibrillation ([place])   . Hx of CABG   . Hypertension   . Left ventricular dysfunction 2007   LVEF 47%   . OSA (obstructive sleep apnea)   . Rheumatic fever   . Subclavian bypass stenosis ([place])   . Vitamin B12 deficiency     Medications:  Scheduled:  . carvedilol  12.5 mg Oral BID WC  . ezetimibe-simvastatin  1 tablet Oral QHS  . folic acid  1 mg Oral Daily  . furosemide  80 mg Intravenous Q6H  . insulin aspart  0-15 Units Subcutaneous TID WC  . ipratropium-albuterol  3 mL Nebulization TID  . sodium chloride flush  3 mL Intravenous Q12H  . vitamin B-12  3,000 mcg Oral Daily  . Warfarin - Pharmacist Dosing Inpatient   Does not apply q1800    Assessment: 82 yo male on chronic Coumadin for mechanical aortic valve + afib. Today's INR at goal at 3.04. PTA Coumadin dose 5 mg daily except 2.5 mg on Mon, Fri.  Goal of Therapy:  INR 2.5-3.5 Monitor  platelets by anticoagulation protocol: Yes   Plan:  1. Warfarin 2.5 mg x 1 tonight (c/w home dose). 2. Daily PT/INR.  Jackson Latino, PharmD PGY1 Pharmacy Resident Phone [phone] 10/19/2017     10:48 AM

## 2017-10-20 LAB — BASIC METABOLIC PANEL
ANION GAP: 7 (ref 5–15)
BUN: 30 mg/dL — ABNORMAL HIGH (ref 8–23)
CALCIUM: 9 mg/dL (ref 8.9–10.3)
CO2: 36 mmol/L — AB (ref 22–32)
Chloride: 99 mmol/L (ref 98–111)
Creatinine, Ser: 1.5 mg/dL — ABNORMAL HIGH (ref 0.61–1.24)
GFR, EST AFRICAN AMERICAN: 49 mL/min — AB (ref >60)
GFR, EST NON AFRICAN AMERICAN: 42 mL/min — AB (ref >60)
Glucose, Bld: 138 mg/dL — ABNORMAL HIGH (ref 70–99)
POTASSIUM: 3 mmol/L — AB (ref 3.5–5.1)
Sodium: 142 mmol/L (ref 135–145)

## 2017-10-20 LAB — GLUCOSE, CAPILLARY
GLUCOSE-CAPILLARY: 111 mg/dL — AB (ref 70–99)
GLUCOSE-CAPILLARY: 136 mg/dL — AB (ref 70–99)
Glucose-Capillary: 146 mg/dL — ABNORMAL HIGH (ref 70–99)
Glucose-Capillary: 121 mg/dL — ABNORMAL HIGH (ref 70–99)

## 2017-10-20 LAB — PROTIME-INR
INR: 3.04
Prothrombin Time: 31.3 seconds — ABNORMAL HIGH (ref 11.4–15.2)

## 2017-10-20 LAB — MAGNESIUM: Magnesium: 1.8 mg/dL (ref 1.7–2.4)

## 2017-10-20 MED ORDER — POTASSIUM CHLORIDE CRYS ER 20 MEQ PO TBCR
40.0000 meq | EXTENDED_RELEASE_TABLET | Freq: Two times a day (BID) | ORAL | Status: DC
  Administered 2017-10-20 – 2017-10-21 (×3): 40 meq via ORAL
  Filled 2017-10-20 (×3): qty 2

## 2017-10-20 MED ORDER — METOLAZONE 2.5 MG PO TABS
2.5000 mg | ORAL_TABLET | Freq: Once | ORAL | Status: AC
  Administered 2017-10-20: 2.5 mg via ORAL
  Filled 2017-10-20: qty 1

## 2017-10-20 MED ORDER — POTASSIUM CHLORIDE CRYS ER 20 MEQ PO TBCR
40.0000 meq | EXTENDED_RELEASE_TABLET | Freq: Once | ORAL | Status: AC
  Administered 2017-10-20: 40 meq via ORAL
  Filled 2017-10-20: qty 2

## 2017-10-20 NOTE — Progress Notes (Signed)
ANTICOAGULATION CONSULT NOTE - Follow-up Consult  Pharmacy Consult for Coumadin Indication: mechanical aortic valve + Afib  Allergies  Allergen Reactions  . Rifampin Rash    May have been caused by Vancomycin or Rifampin (??)  . Vancomycin Rash    May have been caused by Vancomycin or Rifampin (??)    Patient Measurements: Height: 6\' 2"  (188 cm) Weight: 295 lb 11.2 oz (134.1 kg) IBW/kg (Calculated) : 82.2 Heparin Dosing Weight: n/a   Vital Signs: Temp: 98.4 F (36.9 C) (07/20 0628) Temp Source: Oral (07/20 0628) BP: 115/57 (07/20 0628) Pulse Rate: 60 (07/20 0628)  Labs: Recent Labs    10/18/17 1510 10/19/17 0521 10/20/17 0457  HGB 9.8*  --   --   HCT 31.9*  --   --   PLT 75*  --   --   LABPROT 31.1* 31.3* 31.3*  INR 3.02 3.04 3.04  CREATININE 1.47* 1.42* 1.50*    Estimated Creatinine Clearance: 57.2 mL/min (A) (by C-G formula based on SCr of 1.5 mg/dL (H)).   Medical History: Past Medical History:  Diagnosis Date  . Aortic stenosis   . Carotid artery disease ([place]) 1994   s/p left carotid endarerectomy   . CHF with unknown LVEF (HCC)    47 %  . Chronic atrial fibrillation ([place])   . Hx of CABG   . Hypertension   . Left ventricular dysfunction 2007   LVEF 47%   . OSA (obstructive sleep apnea)   . Rheumatic fever   . Subclavian bypass stenosis ([place])   . Vitamin B12 deficiency     Medications:  Scheduled:  . carvedilol  12.5 mg Oral BID WC  . ezetimibe-simvastatin  1 tablet Oral QHS  . folic acid  1 mg Oral Daily  . insulin aspart  0-15 Units Subcutaneous TID WC  . ipratropium-albuterol  3 mL Nebulization TID  . metolazone  2.5 mg Oral Once  . potassium chloride  40 mEq Oral BID  . potassium chloride  40 mEq Oral Once  . sodium chloride flush  3 mL Intravenous Q12H  . spironolactone  12.5 mg Oral Daily  . vitamin B-12  3,000 mcg Oral Daily  . warfarin  2.5 mg Oral Once per day on Mon Fri  . warfarin  5 mg Oral Once per day on Sun Tue Wed Thu Sat   . Warfarin - Pharmacist Dosing Inpatient   Does not apply q1800    Assessment: 82 yo male on chronic Coumadin for mechanical aortic valve + afib. Today's INR at goal at 3.04. PTA Coumadin dose 5 mg daily except 2.5 mg on Mon, Fri.  Goal of Therapy:  INR 2.5-3.5 Monitor platelets by anticoagulation protocol: Yes   Plan:  1. Warfarin 5 mg x 1 tonight (c/w home dose). 2. Daily PT/INR.  Jackson Latino, PharmD PGY1 Pharmacy Resident Phone [phone] 10/20/2017     10:28 AM

## 2017-10-20 NOTE — Progress Notes (Signed)
Physical Therapy Treatment Patient Details Name: [name] MRN: [id number] DOB: 11/22/1935 Today's Date: 10/20/2017    History of Present Illness 82 y.o. male who presents for with mechanical aortic valve 1994, obstructive sleep apnea on chronic CPAP, chronic atrial fibrillation, chronic combined systolic and diastolic heart failure, chronic stage III CKD, Coumadin therapy, prior CABG 1994, and recently diagnosed with COPD and placed on chronic oxygen therapy by hospitalist during an early June 2019 admission Progressive abdominal swelling, lower extremity swelling with anasarca over the past 6 weeks    PT Comments    Making steady progress. SpO2 88% with amb on 3L. May need to incr to 4L with activity.   Follow Up Recommendations  Home health PT;Supervision/Assistance - 24 hour     Equipment Recommendations  None recommended by PT    Recommendations for Other Services       Precautions / Restrictions Precautions Precautions: Fall Precaution Comments: 2 months ago tripped on throw rug, all throw rugs gone from home Restrictions Weight Bearing Restrictions: No    Mobility  Bed Mobility Overal bed mobility: Needs Assistance Bed Mobility: Sit to Supine     Supine to sit: Min assist Sit to supine: Min assist   General bed mobility comments: Assist to bring legs back up in bed  Transfers Overall transfer level: Needs assistance Equipment used: 4-wheeled walker Transfers: Sit to/from Stand Sit to Stand: Min assist Stand pivot transfers: Min guard(with rollator)       General transfer comment: Assist to bring hips up.  Ambulation/Gait Ambulation/Gait assistance: Min guard Gait Distance (Feet): 100 Feet Assistive device: 4-wheeled walker Gait Pattern/deviations: Step-through pattern;Decreased stride length;Trunk flexed Gait velocity: decr Gait velocity interpretation: <1.31 ft/sec, indicative of household ambulator General Gait Details: Assist for  Barrister's clerk    Modified Rankin (Stroke Patients Only)       Balance Overall balance assessment: Needs assistance Sitting-balance support: Feet supported;No upper extremity supported Sitting balance-Leahy Scale: Fair     Standing balance support: During functional activity;No upper extremity supported Standing balance-Leahy Scale: Fair Standing balance comment: more stable with RW support                            Cognition Arousal/Alertness: Awake/alert Behavior During Therapy: WFL for tasks assessed/performed Overall Cognitive Status: Within Functional Limits for tasks assessed                                        Exercises      General Comments General comments (skin integrity, edema, etc.): SpO2 at rest 100% on 2L. Amb on 3L with SpO2 88%      Pertinent Vitals/Pain Pain Assessment: No/denies pain    Home Living Family/patient expects to be discharged to:: Private residence Living Arrangements: Spouse/significant other Available Help at Discharge: Family;Available 24 hours/day Type of Home: House Home Access: Stairs to enter Entrance Stairs-Rails: None Home Layout: Two level;Able to live on main level with bedroom/bathroom Home Equipment: Grab bars - toilet;Walker - 4 wheels;Bedside commode;Shower seat - built Investment banker, operational      Prior Function Level of Independence: Needs assistance  Gait / Transfers Assistance Needed: uses Rollator limited community distances, to get to [facility],  ADL's / Homemaking Assistance Needed: wife assists with socks  PT Goals (current goals can now be found in the care plan section) Acute Rehab PT Goals Patient Stated Goal: plan is to go home PT Goal Formulation: With patient Time For Goal Achievement: 11/02/17 Potential to Achieve Goals: Fair Progress towards PT goals: Progressing toward goals;Goals met and updated - see care  plan    Frequency    Min 3X/week      PT Plan Current plan remains appropriate    Co-evaluation              AM-PAC PT "6 Clicks" Daily Activity  Outcome Measure  Difficulty turning over in bed (including adjusting bedclothes, sheets and blankets)?: A Little Difficulty moving from lying on back to sitting on the side of the bed? : Unable Difficulty sitting down on and standing up from a chair with arms (e.g., wheelchair, bedside commode, etc,.)?: Unable Help needed moving to and from a bed to chair (including a wheelchair)?: A Little Help needed walking in hospital room?: A Little Help needed climbing 3-5 steps with a railing? : A Lot 6 Click Score: 13    End of Session Equipment Utilized During Treatment: Gait belt;Oxygen Activity Tolerance: Patient tolerated treatment well Patient left: with call bell/phone within reach;in bed;with bed alarm set;with family/visitor present Nurse Communication: Mobility status PT Visit Diagnosis: Unsteadiness on feet (R26.81);Other abnormalities of gait and mobility (R26.89);Muscle weakness (generalized) (M62.81);History of falling (Z91.81);Difficulty in walking, not elsewhere classified (R26.2);Dizziness and giddiness (R42)     Time: 0601-5615 PT Time Calculation (min) (ACUTE ONLY): 22 min  Charges:  $Gait Training: 8-22 mins                    G Codes:       [facility] PT [place] 10/20/2017, 12:45 PM

## 2017-10-20 NOTE — Plan of Care (Signed)
  Problem: Nutrition: Goal: Adequate nutrition will be maintained Outcome: Completed/Met   Problem: Coping: Goal: Level of anxiety will decrease Outcome: Completed/Met   Problem: Elimination: Goal: Will not experience complications related to bowel motility Outcome: Completed/Met   Problem: Pain Managment: Goal: General experience of comfort will improve Outcome: Completed/Met

## 2017-10-20 NOTE — Progress Notes (Signed)
Patient ID: [name], male   DOB: 10-01-1935, 82 y.o.   MRN: [id number]     Advanced Heart Failure Rounding Note  PCP-Cardiologist: Belva Crome III, MD   Subjective:    Weight down 3 lbs, has a lot of urine in foley bag.  Not short of breath at rest.    Objective:   Weight Range: 295 lb 11.2 oz (134.1 kg) Body mass index is 37.97 kg/m.   Vital Signs:   Temp:  [97.9 F (36.6 C)-98.4 F (36.9 C)] 98.4 F (36.9 C) (07/20 0628) Pulse Rate:  [60-83] 60 (07/20 0628) Resp:  [16-18] 18 (07/20 0628) BP: (101-115)/(57-65) 115/57 (07/20 0628) SpO2:  [92 %-100 %] 100 % (07/20 0628) Weight:  [295 lb 11.2 oz (134.1 kg)] 295 lb 11.2 oz (134.1 kg) (07/20 0628) Last BM Date: 10/18/17  Weight change: Filed Weights   10/18/17 1458 10/19/17 0515 10/20/17 0628  Weight: 299 lb 1.6 oz (135.7 kg) 298 lb (135.2 kg) 295 lb 11.2 oz (134.1 kg)    Intake/Output:   Intake/Output Summary (Last 24 hours) at 10/20/2017 0942 Last data filed at 10/20/2017 0345 Gross per 24 hour  Intake 600 ml  Output 1600 ml  Net -1000 ml      Physical Exam    General:  Well appearing. No resp difficulty HEENT: Normal Neck: Supple. JVP 14.  No lymphadenopathy or thyromegaly appreciated. Cor: PMI nondisplaced. Irregular rate & rhythm with mechanical S2. No rubs, gallops. 2/6 HSM apex, 2/6 SEM RUSB. Lungs: Clear Abdomen: Soft, nontender, nondistended. No hepatosplenomegaly. No bruits or masses. Good bowel sounds. Extremities: No cyanosis, clubbing, rash.  2+ edema to thighs.  Neuro: Alert & orientedx3, cranial nerves grossly intact. moves all 4 extremities w/o difficulty. Affect pleasant   Telemetry   Atrial fibrillation 80s (personally reviewed)  Labs    CBC Recent Labs    10/18/17 1510  WBC 3.6*  NEUTROABS 2.6  HGB 9.8*  HCT 31.9*  MCV 111.1*  PLT 75*   Basic Metabolic Panel Recent Labs    10/18/17 1510 10/19/17 0521 10/20/17 0457  NA 143 141 142  K 3.3* 3.5 3.0*  CL 99 99 99  CO2  30 31 36*  GLUCOSE 126* 120* 138*  BUN 31* 30* 30*  CREATININE 1.47* 1.42* 1.50*  CALCIUM 8.9 8.9 9.0  MG 1.7  --  1.8   Liver Function Tests Recent Labs    10/18/17 1510  AST 29  ALT 15  ALKPHOS 146*  BILITOT 2.0*  PROT 6.4*  ALBUMIN 3.2*   No results for input(s): LIPASE, AMYLASE in the last 72 hours. Cardiac Enzymes No results for input(s): CKTOTAL, CKMB, CKMBINDEX, TROPONINI in the last 72 hours.  BNP: BNP (last 3 results) Recent Labs    08/01/17 1804 10/18/17 1510  BNP 784.4* 1,073.3*    ProBNP (last 3 results) Recent Labs    03/30/17 1147 05/25/17 1044 07/10/17 1523  PROBNP 1,858* 2,749* 411.0*     D-Dimer No results for input(s): DDIMER in the last 72 hours. Hemoglobin A1C No results for input(s): HGBA1C in the last 72 hours. Fasting Lipid Panel No results for input(s): CHOL, HDL, LDLCALC, TRIG, CHOLHDL, LDLDIRECT in the last 72 hours. Thyroid Function Tests Recent Labs    10/18/17 1510  TSH 5.026*    Other results:   Imaging     No results found.   Medications:     Scheduled Medications: . carvedilol  12.5 mg Oral BID WC  . ezetimibe-simvastatin  1 tablet Oral QHS  . folic acid  1 mg Oral Daily  . insulin aspart  0-15 Units Subcutaneous TID WC  . ipratropium-albuterol  3 mL Nebulization TID  . metolazone  2.5 mg Oral Once  . potassium chloride  40 mEq Oral BID  . potassium chloride  40 mEq Oral Once  . sodium chloride flush  3 mL Intravenous Q12H  . spironolactone  12.5 mg Oral Daily  . vitamin B-12  3,000 mcg Oral Daily  . warfarin  2.5 mg Oral Once per day on Mon Fri  . warfarin  5 mg Oral Once per day on Sun Tue Wed Thu Sat  . Warfarin - Pharmacist Dosing Inpatient   Does not apply q1800     Infusions: . sodium chloride    . furosemide (LASIX) infusion 12 mg/hr (10/19/17 1558)     PRN Medications:  sodium chloride, acetaminophen, fluocinonide cream, ondansetron (ZOFRAN) IV, sodium chloride flush    Patient  Profile   [name] is a 82 y.o. male with h/o mechanical AVR 1994, OSA on CPAP, Chronic afib, chronic combined CHF, CKD III, chronic coumadin therapy, CAD s/p CAGB 1994, and COPD.   Admitted from [facility] office 10/18/17 with Acute on chronic diastolic CHF.   Assessment/Plan   1. Acute on chronic diastolic CHF with prominent RV failure: Echo this admission showed EF 55-65%, mechanical aortic valve functioning normally, mild mitral stenosis/severe mitral regurgitation, RV moderately dilated with moderately decreased systolic function, PASP 49 mmHg.  Severe MR could be contributing to the significant RV failure. Creatinine stable at 1.5.  Weight down 3 lbs today.  He remains volume overloaded.  - Increase Lasix to 15 mg/hr and will give metolazone 2.5 x 1 today.  Replace K aggressively.  - Will need closer evaluation of mitral valve by TEE when he is better-diuresed.  If MR is severe, would consider Mitraclip, but if there is significant mitral stenosis he may not be a good candidate.  3. Mechanical aortic valve: Appears to function well on echo this admission.  - Continue warfarin with INR goal 2.5-3.5 (give RFs of CHF and chronic afib).  4. Atrial fibrillation: Chronic.  - Continue Coreg 12.5 mg bid, HR is controlled.  - Continue warfarin.  5. CAD: s/p CABG.  No chest pain.  - Continue Vytorin.  6. COPD: On home oxygen.  7. OSA: Continue home CPAP.  8. CKD: Stage 3.  Follow creatinine closely with diuresis.   Length of Stay: 2  Loralie Champagne, MD  10/20/2017, 9:42 AM  Advanced Heart Failure Team Pager [phone] (M-F; 7a - 4p)  Please contact [facility] for night-coverage after hours (4p -7a ) and weekends on [email or web address]

## 2017-10-20 NOTE — Evaluation (Signed)
Occupational Therapy Evaluation Patient Details Name: [name] MRN: [id number] DOB: 1935/06/24 Today's Date: 10/20/2017    History of Present Illness 82 y.o. male with history of mechanical aortic valve 1994, obstructive sleep apnea on chronic CPAP, chronic atrial fibrillation, chronic combined systolic and diastolic heart failure, chronic stage III CKD, Coumadin therapy, prior CABG 1994, and recently diagnosed with COPD and placed on chronic oxygen therapy by hospitalist during an early June 2019 admission Progressive abdominal swelling, lower extremity swelling with anasarca over the past 6 weeks   Clinical Impression   Pt admitted for above. Pt requiring assist with socks, PTA. Feel pt will benefit from acute OT to increase independence prior to d/c. Recommending HHOT upon d/c.     Follow Up Recommendations  Home health OT;Supervision - Intermittent    Equipment Recommendations  Other (comment)(long handled sponge)    Recommendations for Other Services       Precautions / Restrictions Precautions Precautions: Fall Precaution Comments: 2 months ago tripped on throw rug, all throw rugs gone from home Restrictions Weight Bearing Restrictions: No      Mobility Bed Mobility Overal bed mobility: Needs Assistance Bed Mobility: Supine to Sit     Supine to sit: Min assist     General bed mobility comments: assist with trunk.  Transfers Overall transfer level: Needs assistance   Transfers: Sit to/from Stand;Stand Pivot Transfers Sit to Stand: Min guard;From elevated surface Stand pivot transfers: Min guard(with rollator)            Balance    Used rollator for stand pivot transfer to recliner chair.                                        ADL either performed or assessed with clinical judgement   ADL Overall ADL's : Needs assistance/impaired     Grooming: Set up;Supervision/safety;Sitting               Lower Body Dressing: Sit to/from  stand;Maximal assistance   Toilet Transfer: Min guard;Stand-pivot(rollator)           Functional mobility during ADLs: Min guard;Rolling walker General ADL Comments: Talked about use of long handled sponge. Talked about energy conservation briefly.      Vision         Perception     Praxis      Pertinent Vitals/Pain Pain Assessment: No/denies pain     Hand Dominance Right   Extremity/Trunk Assessment Upper Extremity Assessment Upper Extremity Assessment: RUE deficits/detail;LUE deficits/detail RUE Deficits / Details: limited AROM shoulder flexion-approximately 90 degrees LUE Deficits / Details: limited AROM shoulder flexion-approximately 90 degrees   Lower Extremity Assessment Lower Extremity Assessment: Defer to PT evaluation       Communication Communication Communication: No difficulties   Cognition Arousal/Alertness: Awake/alert Behavior During Therapy: WFL for tasks assessed/performed Overall Cognitive Status: Within Functional Limits for tasks assessed                                     General Comments       Exercises     Shoulder Instructions      Home Living Family/patient expects to be discharged to:: Private residence Living Arrangements: Spouse/significant other Available Help at Discharge: Family;Available 24 hours/day Type of Home: House Home Access: Stairs to enter [employer] of  Steps: 1 Entrance Stairs-Rails: None Home Layout: Two level;Able to live on main level with bedroom/bathroom Alternate Level Stairs-Number of Steps: 15(stair lift to get to upstairs bonus room)   Bathroom Shower/Tub: Occupational psychologist: Handicapped height Bathroom Accessibility: Yes   Home Equipment: Grab bars - toilet;Walker - 4 wheels;Bedside commode;Shower seat - built Hotel manager: Reacher;Sock aid;Long-handled Conservation officer, historic buildings        Prior Functioning/Environment Level of Independence:  Needs assistance  Gait / Transfers Assistance Needed: uses Rollator limited community distances, to get to [employer] appointments,  ADL's / Homemaking Assistance Needed: wife assists with socks            OT Problem List: Decreased strength;Decreased range of motion;Decreased activity tolerance;Decreased knowledge of use of DME or AE;Obesity      OT Treatment/Interventions: Self-care/ADL training;Energy conservation;DME and/or AE instruction;Balance training;Patient/family education;Therapeutic activities    OT Goals(Current goals can be found in the care plan section) Acute Rehab OT Goals Patient Stated Goal: plan is to go home OT Goal Formulation: With patient Time For Goal Achievement: 10/27/17 Potential to Achieve Goals: Good ADL Goals Pt Will Perform Lower Body Dressing: with min guard assist;with adaptive equipment;sit to/from stand Pt Will Transfer to Toilet: with supervision;ambulating Pt Will Perform Toileting - Clothing Manipulation and hygiene: with supervision;sit to/from stand Additional ADL Goal #1: Pt will independently state 3 energy conservation techniques and utilize in session as needed.  OT Frequency: Min 2X/week   Barriers to D/C:            Co-evaluation              AM-PAC PT "6 Clicks" Daily Activity     Outcome Measure Help from another person eating meals?: None Help from another person taking care of personal grooming?: A Little Help from another person toileting, which includes using toliet, bedpan, or urinal?: A Little Help from another person bathing (including washing, rinsing, drying)?: A Lot Help from another person to put on and taking off regular upper body clothing?: A Little Help from another person to put on and taking off regular lower body clothing?: A Lot 6 Click Score: 17   End of Session Equipment Utilized During Treatment: Gait belt;Oxygen;Other (comment)(rollator)  Activity Tolerance: Patient tolerated treatment  well Patient left: in chair;with call bell/phone within reach;Other (comment)(respiratory in room)  OT Visit Diagnosis: Other (comment);Muscle weakness (generalized) (M62.81)(decreased activity tolerance)                Time: 3646-8032 OT Time Calculation (min): 18 min Charges:  OT General Charges $OT Visit: 1 Visit OT Evaluation $OT Eval Moderate Complexity: 1 Mod G-Codes:      Sayan Aldava L Jionni Helming OTR/L 10/20/2017, 9:54 AM

## 2017-10-20 NOTE — Plan of Care (Signed)
  Problem: Activity: Goal: Capacity to carry out activities will improve Outcome: Progressing   Problem: Education: Goal: Ability to demonstrate management of disease process will improve Outcome: Progressing

## 2017-10-21 LAB — BASIC METABOLIC PANEL
ANION GAP: 9 (ref 5–15)
BUN: 30 mg/dL — ABNORMAL HIGH (ref 8–23)
CALCIUM: 9.1 mg/dL (ref 8.9–10.3)
CO2: 35 mmol/L — AB (ref 22–32)
Chloride: 95 mmol/L — ABNORMAL LOW (ref 98–111)
Creatinine, Ser: 1.36 mg/dL — ABNORMAL HIGH (ref 0.61–1.24)
GFR, EST AFRICAN AMERICAN: 55 mL/min — AB (ref >60)
GFR, EST NON AFRICAN AMERICAN: 48 mL/min — AB (ref >60)
Glucose, Bld: 148 mg/dL — ABNORMAL HIGH (ref 70–99)
Potassium: 3.1 mmol/L — ABNORMAL LOW (ref 3.5–5.1)
SODIUM: 139 mmol/L (ref 135–145)

## 2017-10-21 LAB — GLUCOSE, CAPILLARY
GLUCOSE-CAPILLARY: 125 mg/dL — AB (ref 70–99)
GLUCOSE-CAPILLARY: 114 mg/dL — AB (ref 70–99)
GLUCOSE-CAPILLARY: 126 mg/dL — AB (ref 70–99)
Glucose-Capillary: 127 mg/dL — ABNORMAL HIGH (ref 70–99)

## 2017-10-21 LAB — PROTIME-INR
INR: 2.4
PROTHROMBIN TIME: 26 s — AB (ref 11.4–15.2)

## 2017-10-21 MED ORDER — SODIUM CHLORIDE 0.9 % IV SOLN
INTRAVENOUS | Status: DC
  Administered 2017-10-22: 05:00:00 via INTRAVENOUS

## 2017-10-21 MED ORDER — POTASSIUM CHLORIDE CRYS ER 20 MEQ PO TBCR
40.0000 meq | EXTENDED_RELEASE_TABLET | Freq: Once | ORAL | Status: AC
  Administered 2017-10-21: 40 meq via ORAL
  Filled 2017-10-21: qty 2

## 2017-10-21 MED ORDER — METOLAZONE 2.5 MG PO TABS
2.5000 mg | ORAL_TABLET | Freq: Once | ORAL | Status: AC
  Administered 2017-10-21: 2.5 mg via ORAL
  Filled 2017-10-21: qty 1

## 2017-10-21 MED ORDER — SPIRONOLACTONE 12.5 MG HALF TABLET
12.5000 mg | ORAL_TABLET | Freq: Once | ORAL | Status: AC
  Administered 2017-10-21: 12.5 mg via ORAL
  Filled 2017-10-21: qty 1

## 2017-10-21 MED ORDER — POTASSIUM CHLORIDE CRYS ER 20 MEQ PO TBCR
60.0000 meq | EXTENDED_RELEASE_TABLET | Freq: Two times a day (BID) | ORAL | Status: DC
  Administered 2017-10-21 – 2017-10-28 (×13): 60 meq via ORAL
  Filled 2017-10-21 (×15): qty 3

## 2017-10-21 MED ORDER — SPIRONOLACTONE 25 MG PO TABS
25.0000 mg | ORAL_TABLET | Freq: Every day | ORAL | Status: DC
  Administered 2017-10-22 – 2017-10-30 (×9): 25 mg via ORAL
  Filled 2017-10-21 (×9): qty 1

## 2017-10-21 NOTE — Progress Notes (Signed)
ANTICOAGULATION CONSULT NOTE - Follow-up Consult  Pharmacy Consult for Coumadin Indication: mechanical aortic valve + Afib  Allergies  Allergen Reactions  . Rifampin Rash    May have been caused by Vancomycin or Rifampin (??)  . Vancomycin Rash    May have been caused by Vancomycin or Rifampin (??)    Patient Measurements: Height: 6\' 2"  (188 cm) Weight: 291 lb 3.2 oz (132.1 kg)(scale A) IBW/kg (Calculated) : 82.2 Heparin Dosing Weight: n/a   Vital Signs: Temp: 98.2 F (36.8 C) (07/21 0415) Temp Source: Oral (07/21 0415) BP: 106/68 (07/21 0415) Pulse Rate: 69 (07/21 0728)  Labs: Recent Labs    10/18/17 1510 10/19/17 0521 10/20/17 0457 10/21/17 0527  HGB 9.8*  --   --   --   HCT 31.9*  --   --   --   PLT 75*  --   --   --   LABPROT 31.1* 31.3* 31.3* 26.0*  INR 3.02 3.04 3.04 2.40  CREATININE 1.47* 1.42* 1.50* 1.36*    Estimated Creatinine Clearance: 62.6 mL/min (A) (by C-G formula based on SCr of 1.36 mg/dL (H)).   Medical History: Past Medical History:  Diagnosis Date  . Aortic stenosis   . Carotid artery disease ([place]) 1994   s/p left carotid endarerectomy   . CHF with unknown LVEF ([facility])    47 %  . Chronic atrial fibrillation ([place])   . Hx of CABG   . Hypertension   . Left ventricular dysfunction 2007   LVEF 47%   . OSA (obstructive sleep apnea)   . Rheumatic fever   . Subclavian bypass stenosis ([place])   . Vitamin B12 deficiency     Medications:  Scheduled:  . carvedilol  12.5 mg Oral BID WC  . ezetimibe-simvastatin  1 tablet Oral QHS  . folic acid  1 mg Oral Daily  . insulin aspart  0-15 Units Subcutaneous TID WC  . ipratropium-albuterol  3 mL Nebulization TID  . potassium chloride  40 mEq Oral BID  . sodium chloride flush  3 mL Intravenous Q12H  . spironolactone  12.5 mg Oral Daily  . vitamin B-12  3,000 mcg Oral Daily  . warfarin  2.5 mg Oral Once per day on Mon Fri  . warfarin  5 mg Oral Once per day on Sun Tue Wed Thu Sat  . Warfarin -  Pharmacist Dosing Inpatient   Does not apply q1800    Assessment: 82 yo male on chronic Coumadin for mechanical aortic valve + afib. PTA Coumadin dose 5 mg daily except 2.5 mg on Mon, Fri. Today's INR slightly subtherapeutic at 2.4. INR has been therapeutic over previous three days while pt has been on home dose of coumadin.   Goal of Therapy:  INR 2.5-3.5 Monitor platelets by anticoagulation protocol: Yes   Plan:  1. Warfarin 5 mg x 1 tonight (c/w home dose). 2. Daily PT/INR.  Jackson Latino, PharmD PGY1 Pharmacy Resident Phone [phone] 10/21/2017     7:42 AM

## 2017-10-21 NOTE — Progress Notes (Signed)
Called ortho tech regarding unnaboots for pt. Told the hospital is out of coban to wrap legs with. They are aware of the order and will apply them as soon as supplies come in. Susie Cassette RN

## 2017-10-21 NOTE — Progress Notes (Signed)
Patient was placed on CPAP 8 cmH2O via nasal mask with 2 L of O2 bleed in. Patient is tolerating at this time.     10/21/17 2057  BiPAP/CPAP/SIPAP  BiPAP/CPAP/SIPAP Pt Type Adult  Mask Type Nasal mask  Mask Size Medium  EPAP 8 cmH2O  Flow Rate 2 lpm  BiPAP/CPAP/SIPAP CPAP  Patient Home Equipment No  Auto Titrate No  BiPAP/CPAP /SiPAP Vitals  Bilateral Breath Sounds Clear;Diminished

## 2017-10-21 NOTE — Progress Notes (Signed)
Patient ID: [name], male   DOB: [birth year], 82 y.o.   MRN: [id number]     Advanced Heart Failure Rounding Note  PCP-Cardiologist: Belva Crome III, MD   Subjective:    Weight down 4 lbs.  Creatinine stable at 1.36.  Walking in hall without dyspnea.   Objective:   Weight Range: 291 lb 3.2 oz (132.1 kg) Body mass index is 37.39 kg/m.   Vital Signs:   Temp:  [97.9 F (36.6 C)-98.8 F (37.1 C)] 98.2 F (36.8 C) (07/21 0415) Pulse Rate:  [62-72] 69 (07/21 0728) Resp:  [16-24] 21 (07/21 0728) BP: (90-113)/(49-70) 106/68 (07/21 0415) SpO2:  [84 %-99 %] 97 % (07/21 0728) Weight:  [291 lb 3.2 oz (132.1 kg)] 291 lb 3.2 oz (132.1 kg) (07/21 0450) Last BM Date: 10/20/17  Weight change: Filed Weights   10/19/17 0515 10/20/17 0628 10/21/17 0450  Weight: 298 lb (135.2 kg) 295 lb 11.2 oz (134.1 kg) 291 lb 3.2 oz (132.1 kg)    Intake/Output:   Intake/Output Summary (Last 24 hours) at 10/21/2017 0944 Last data filed at 10/21/2017 0900 Gross per 24 hour  Intake 1510.96 ml  Output 3900 ml  Net -2389.04 ml      Physical Exam    General: NAD Neck: JVP 12 cm, no thyromegaly or thyroid nodule.  Lungs: Clear to auscultation bilaterally with normal respiratory effort. CV: Nondisplaced PMI.  Heart irregular S1/S2 with mechanical S2, 2/6 SEM RUSB and 2/6 HSM apex.  2+ edema to thighs.  Abdomen: Soft, nontender, no hepatosplenomegaly, no distention.  Skin: Intact without lesions or rashes.  Neurologic: Alert and oriented x 3.  Psych: Normal affect. Extremities: No clubbing or cyanosis.  HEENT: Normal.    Telemetry   Atrial fibrillation 80s (personally reviewed)  Labs    CBC Recent Labs    10/18/17 1510  WBC 3.6*  NEUTROABS 2.6  HGB 9.8*  HCT 31.9*  MCV 111.1*  PLT 75*   Basic Metabolic Panel Recent Labs    10/18/17 1510  10/20/17 0457 10/21/17 0527  NA 143   < > 142 139  K 3.3*   < > 3.0* 3.1*  CL 99   < > 99 95*  CO2 30   < > 36* 35*  GLUCOSE 126*   < >  138* 148*  BUN 31*   < > 30* 30*  CREATININE 1.47*   < > 1.50* 1.36*  CALCIUM 8.9   < > 9.0 9.1  MG 1.7  --  1.8  --    < > = values in this interval not displayed.   Liver Function Tests Recent Labs    10/18/17 1510  AST 29  ALT 15  ALKPHOS 146*  BILITOT 2.0*  PROT 6.4*  ALBUMIN 3.2*   No results for input(s): LIPASE, AMYLASE in the last 72 hours. Cardiac Enzymes No results for input(s): CKTOTAL, CKMB, CKMBINDEX, TROPONINI in the last 72 hours.  BNP: BNP (last 3 results) Recent Labs    08/01/17 1804 10/18/17 1510  BNP 784.4* 1,073.3*    ProBNP (last 3 results) Recent Labs    03/30/17 1147 05/25/17 1044 07/10/17 1523  PROBNP 1,858* 2,749* 411.0*     D-Dimer No results for input(s): DDIMER in the last 72 hours. Hemoglobin A1C No results for input(s): HGBA1C in the last 72 hours. Fasting Lipid Panel No results for input(s): CHOL, HDL, LDLCALC, TRIG, CHOLHDL, LDLDIRECT in the last 72 hours. Thyroid Function Tests Recent Labs    10/18/17  1510  TSH 5.026*    Other results:   Imaging    No results found.   Medications:     Scheduled Medications: . carvedilol  12.5 mg Oral BID WC  . ezetimibe-simvastatin  1 tablet Oral QHS  . folic acid  1 mg Oral Daily  . insulin aspart  0-15 Units Subcutaneous TID WC  . ipratropium-albuterol  3 mL Nebulization TID  . metolazone  2.5 mg Oral Once  . potassium chloride  40 mEq Oral Once  . potassium chloride  60 mEq Oral BID  . sodium chloride flush  3 mL Intravenous Q12H  . spironolactone  12.5 mg Oral Once  . [START ON 10/22/2017] spironolactone  25 mg Oral Daily  . vitamin B-12  3,000 mcg Oral Daily  . warfarin  2.5 mg Oral Once per day on Mon Fri  . warfarin  5 mg Oral Once per day on Sun Tue Wed Thu Sat  . Warfarin - Pharmacist Dosing Inpatient   Does not apply q1800    Infusions: . sodium chloride    . furosemide (LASIX) infusion 15 mg/hr (10/21/17 0900)    PRN Medications: sodium chloride,  acetaminophen, fluocinonide cream, ondansetron (ZOFRAN) IV, sodium chloride flush    Patient Profile   [name] is a 82 y.o. male with h/o mechanical AVR 1994, OSA on CPAP, Chronic afib, chronic combined CHF, CKD III, chronic coumadin therapy, CAD s/p CAGB 1994, and COPD.   Admitted from [facility] office 10/18/17 with Acute on chronic diastolic CHF.   Assessment/Plan   1. Acute on chronic diastolic CHF with prominent RV failure: Echo this admission showed EF 55-65%, mechanical aortic valve functioning normally, mild mitral stenosis/severe mitral regurgitation, RV moderately dilated with moderately decreased systolic function, PASP 49 mmHg.  Severe MR could be contributing to the significant RV failure. Creatinine stable at 1.36.  Weight down 4 lbs today.  He remains volume overloaded.  - Continue Lasix 15 mg/hr and will give metolazone 2.5 x 1 again today.  Replace K aggressively.  - Increase spironolactone to 25 mg daily.  - Will need closer evaluation of mitral valve by TEE.  If MR is severe, would consider Mitraclip, but if there is significant mitral stenosis he may not be a good candidate. TEE ordered for tomorrow. Discussed risks/benefits with patient and he agrees to proceed.  - Still waiting for unna boots, ordered again.  3. Mechanical aortic valve: Appears to function well on echo this admission.  - Continue warfarin with INR goal 2.5-3.5 (give RFs of CHF and chronic afib).  4. Atrial fibrillation: Chronic.  - Continue Coreg 12.5 mg bid, HR is controlled.  - Continue warfarin.  5. CAD: s/p CABG.  No chest pain.  - Continue Vytorin.  6. COPD: On home oxygen.  7. OSA: Continue home CPAP.  8. CKD: Stage 3.  Follow creatinine closely with diuresis, stable so far.   Length of Stay: 3  Loralie Champagne, MD  10/21/2017, 9:44 AM  Advanced Heart Failure Team Pager [phone] (M-F; 7a - 4p)  Please contact [facility] for night-coverage after hours (4p -7a ) and weekends on  [email or web address]

## 2017-10-21 NOTE — Progress Notes (Signed)
Took report from [employer].

## 2017-10-21 NOTE — Plan of Care (Signed)
  Problem: Elimination: Goal: Will not experience complications related to urinary retention Outcome: Completed/Met   Problem: Safety: Goal: Ability to remain free from injury will improve Outcome: Completed/Met

## 2017-10-22 DIAGNOSIS — I5033 Acute on chronic diastolic (congestive) heart failure: Secondary | ICD-10-CM

## 2017-10-22 LAB — BASIC METABOLIC PANEL
ANION GAP: 10 (ref 5–15)
BUN: 34 mg/dL — ABNORMAL HIGH (ref 8–23)
CO2: 38 mmol/L — AB (ref 22–32)
Calcium: 9.3 mg/dL (ref 8.9–10.3)
Chloride: 95 mmol/L — ABNORMAL LOW (ref 98–111)
Creatinine, Ser: 1.37 mg/dL — ABNORMAL HIGH (ref 0.61–1.24)
GFR calc non Af Amer: 47 mL/min — ABNORMAL LOW (ref >60)
GFR, EST AFRICAN AMERICAN: 55 mL/min — AB (ref >60)
GLUCOSE: 112 mg/dL — AB (ref 70–99)
POTASSIUM: 3.4 mmol/L — AB (ref 3.5–5.1)
Sodium: 143 mmol/L (ref 135–145)

## 2017-10-22 LAB — GLUCOSE, CAPILLARY
GLUCOSE-CAPILLARY: 167 mg/dL — AB (ref 70–99)
Glucose-Capillary: 129 mg/dL — ABNORMAL HIGH (ref 70–99)
Glucose-Capillary: 106 mg/dL — ABNORMAL HIGH (ref 70–99)
Glucose-Capillary: 197 mg/dL — ABNORMAL HIGH (ref 70–99)

## 2017-10-22 LAB — PROTIME-INR
INR: 2.01
Prothrombin Time: 22.6 seconds — ABNORMAL HIGH (ref 11.4–15.2)

## 2017-10-22 LAB — MAGNESIUM: MAGNESIUM: 1.9 mg/dL (ref 1.7–2.4)

## 2017-10-22 MED ORDER — DOCUSATE SODIUM 100 MG PO CAPS
100.0000 mg | ORAL_CAPSULE | Freq: Two times a day (BID) | ORAL | Status: DC
  Administered 2017-10-22 – 2017-11-05 (×27): 100 mg via ORAL
  Filled 2017-10-22 (×30): qty 1

## 2017-10-22 MED ORDER — WARFARIN SODIUM 5 MG PO TABS: 5.0000 mg | ORAL_TABLET | Freq: Once | ORAL | Status: DC

## 2017-10-22 MED ORDER — WARFARIN SODIUM 7.5 MG PO TABS
7.5000 mg | ORAL_TABLET | Freq: Once | ORAL | Status: AC
  Administered 2017-10-22: 7.5 mg via ORAL
  Filled 2017-10-22: qty 1

## 2017-10-22 MED ORDER — METOLAZONE 2.5 MG PO TABS
2.5000 mg | ORAL_TABLET | Freq: Once | ORAL | Status: AC
  Administered 2017-10-22: 2.5 mg via ORAL
  Filled 2017-10-22: qty 1

## 2017-10-22 MED ORDER — SENNA 8.6 MG PO TABS
1.0000 | ORAL_TABLET | Freq: Every day | ORAL | Status: DC | PRN
  Administered 2017-10-26 – 2017-11-03 (×2): 8.6 mg via ORAL
  Filled 2017-10-22 (×2): qty 1

## 2017-10-22 MED ORDER — POTASSIUM CHLORIDE CRYS ER 20 MEQ PO TBCR
40.0000 meq | EXTENDED_RELEASE_TABLET | Freq: Once | ORAL | Status: AC
  Administered 2017-10-22: 40 meq via ORAL

## 2017-10-22 NOTE — Progress Notes (Addendum)
Patient ID: [name], male   DOB: [birth year], 82 y.o.   MRN: [id number]     Advanced Heart Failure Rounding Note  PCP-Cardiologist: [name], MD   Subjective:    Ria Comment with -3.1 L on lasix drip + metolazone. Weight down 2 more lbs. SBP 90-100s. BMET pending.   Denies orthopnea, dizziness, or SOB. Was not very active yesterday. He has been having intermittent chest discomfort that resolves with coughing. No BM since admission.   Objective:   Weight Range: 289 lb 8 oz (131.3 kg) Body mass index is 37.17 kg/m.   Vital Signs:   Temp:  [98.4 F (36.9 C)-98.6 F (37 C)] 98.6 F (37 C) (07/21 1924) Pulse Rate:  [65-81] 81 (07/22 0436) Resp:  [18-24] 18 (07/22 0436) BP: (97-105)/(56-64) 105/56 (07/22 0436) SpO2:  [94 %-98 %] 97 % (07/22 0436) Weight:  [289 lb 8 oz (131.3 kg)] 289 lb 8 oz (131.3 kg) (07/22 0436) Last BM Date: 10/18/17  Weight change: Filed Weights   10/20/17 0628 10/21/17 0450 10/22/17 0436  Weight: 295 lb 11.2 oz (134.1 kg) 291 lb 3.2 oz (132.1 kg) 289 lb 8 oz (131.3 kg)    Intake/Output:   Intake/Output Summary (Last 24 hours) at 10/22/2017 0742 Last data filed at 10/22/2017 0529 Gross per 24 hour  Intake 1286.47 ml  Output 4600 ml  Net -3313.53 ml      Physical Exam    General: . No resp difficulty. HEENT: Normal Neck: Supple. JVP ~10. Carotids 2+ bilat; no bruits. No thyromegaly or nodule noted. Cor: PMI nondisplaced. IRR, No M/G/R noted, mechanical S2, 2/6 SEM RUSB, 2/6 HSM apex Lungs: CTAB, normal effort. Abdomen: Soft, non-tender, non-distended, no HSM. No bruits or masses. +BS  Extremities: No cyanosis, clubbing, or rash. R and LLE 2+ edema into thighs Neuro: Alert & orientedx3, cranial nerves grossly intact. moves all 4 extremities w/o difficulty. Affect pleasant  Telemetry   Afib 60-70s with rare PVCs. Personally reviewed.   Labs    CBC No results for input(s): WBC, NEUTROABS, HGB, HCT, MCV, PLT in the last 72  hours. Basic Metabolic Panel Recent Labs    10/20/17 0457 10/21/17 0527  NA 142 139  K 3.0* 3.1*  CL 99 95*  CO2 36* 35*  GLUCOSE 138* 148*  BUN 30* 30*  CREATININE 1.50* 1.36*  CALCIUM 9.0 9.1  MG 1.8  --    Liver Function Tests No results for input(s): AST, ALT, ALKPHOS, BILITOT, PROT, ALBUMIN in the last 72 hours. No results for input(s): LIPASE, AMYLASE in the last 72 hours. Cardiac Enzymes No results for input(s): CKTOTAL, CKMB, CKMBINDEX, TROPONINI in the last 72 hours.  BNP: BNP (last 3 results) Recent Labs    08/01/17 1804 10/18/17 1510  BNP 784.4* 1,073.3*    ProBNP (last 3 results) Recent Labs    03/30/17 1147 05/25/17 1044 07/10/17 1523  PROBNP 1,858* 2,749* 411.0*     D-Dimer No results for input(s): DDIMER in the last 72 hours. Hemoglobin A1C No results for input(s): HGBA1C in the last 72 hours. Fasting Lipid Panel No results for input(s): CHOL, HDL, LDLCALC, TRIG, CHOLHDL, LDLDIRECT in the last 72 hours. Thyroid Function Tests No results for input(s): TSH, T4TOTAL, T3FREE, THYROIDAB in the last 72 hours.  Invalid input(s): FREET3  Other results:   Imaging    No results found.   Medications:     Scheduled Medications: . carvedilol  12.5 mg Oral BID WC  . ezetimibe-simvastatin  1 tablet Oral QHS  . folic acid  1 mg Oral Daily  . insulin aspart  0-15 Units Subcutaneous TID WC  . ipratropium-albuterol  3 mL Nebulization TID  . potassium chloride  60 mEq Oral BID  . sodium chloride flush  3 mL Intravenous Q12H  . spironolactone  25 mg Oral Daily  . vitamin B-12  3,000 mcg Oral Daily  . warfarin  2.5 mg Oral Once per day on Mon Fri  . warfarin  5 mg Oral Once per day on Sun Tue Wed Thu Sat  . Warfarin - Pharmacist Dosing Inpatient   Does not apply q1800    Infusions: . sodium chloride    . sodium chloride 20 mL/hr at 10/22/17 0529  . furosemide (LASIX) infusion 15 mg/hr (10/22/17 0529)    PRN Medications: sodium chloride,  acetaminophen, fluocinonide cream, ondansetron (ZOFRAN) IV, sodium chloride flush    Patient Profile   [name] is a 82 y.o. male with h/o mechanical AVR 1994, OSA on CPAP, Chronic afib, chronic combined CHF, CKD III, chronic coumadin therapy, CAD s/p CAGB 1994, and COPD.   Admitted from [facility] office 10/18/17 with Acute on chronic diastolic CHF.   Assessment/Plan   1. Acute on chronic diastolic CHF with prominent RV failure: Echo this admission showed EF 55-65%, mechanical aortic valve functioning normally, mild mitral stenosis/severe mitral regurgitation, RV moderately dilated with moderately decreased systolic function, PASP 49 mmHg.  Severe MR could be contributing to the significant RV failure. Volume status improving, but still elevated.  - Continue Lasix 15 mg/hr. Consider another dose of metolazone if creatinine stable.  - Continue spironolactone 25 mg daily.  - Will need closer evaluation of mitral valve by TEE.  If MR is severe, would consider Mitraclip, but if there is significant mitral stenosis he may not be a good candidate. TEE ordered for today. Called and confirmed that he is an add on for today.  - Still waiting for unna boots, hospital is out of coban 3. Mechanical aortic valve: Appears to function well on echo this admission. As above, going for TEE today.  - Continue warfarin with INR goal 2.5-3.5 (give RFs of CHF and chronic afib). INR 2.01. Coumadin per pharmacy.  4. Atrial fibrillation: Chronic.  - Continue Coreg 12.5 mg bid, HR is controlled.  - Continue warfarin. INR 2.01 5. CAD: s/p CABG.  No CP.  - Continue Vytorin.  6. COPD: On home oxygen. No change.  7. OSA: Continue home CPAP. No change.  8. CKD: Stage 3.  Follow creatinine closely with diuresis, stable so far. BMET pending.  9. Constipation - Add colace and senna 10. Deconditioning - PT recommending HH PT and 24 hour supervision  Length of Stay: Lydia, NP  10/22/2017, 7:42  AM  Advanced Heart Failure Team Pager [phone] (M-F; 7a - 4p)  Please contact [facility] for night-coverage after hours (4p -7a ) and weekends on [email or web address]  Patient seen with NP, agree with the above note.  Creatinine stable at 1.37.   On exam, he remains volume overloaded. He is diuresing well so far with Lasix gtt + metolazone.   - Will give metolazone 2.5 x 1 again today.   Needs TEE to assess MV, talked to endo and will make tomorrow at 1 pm since need anesthesia and not sure we can get that today. He can eat today.   Loralie Champagne 10/22/2017 8:51 AM

## 2017-10-22 NOTE — Progress Notes (Signed)
Occupational Therapy Treatment Patient Details Name: [name] MRN: [id number] DOB: [birth year] Today's Date: 10/22/2017    History of present illness 82 y.o. male who presents for with mechanical aortic valve 1994, obstructive sleep apnea on chronic CPAP, chronic atrial fibrillation, chronic combined systolic and diastolic heart failure, chronic stage III CKD, Coumadin therapy, prior CABG 1994, and recently diagnosed with COPD and placed on chronic oxygen therapy by hospitalist during an early June 2019 admission Progressive abdominal swelling, lower extremity swelling with anasarca over the past 6 weeks   OT comments  Returning to assist pt with toileting after BM. Pt requiring Mod A to power up into standing from regular height toilet. Pt performing toilet hygiene and hand hygiene at sink with Min Guard A for safety. Providing pt with handout and education on energy conservation. Pt and wife verbalized understanding and discussed ways to implement EC at home. Continue to recommend dc with HHOT and will continue to follow acutely as admitted.    Follow Up Recommendations  Home health OT;Supervision - Intermittent    Equipment Recommendations  Other (comment)(long handled sponge)    Recommendations for Other Services      Precautions / Restrictions Precautions Precautions: Fall Precaution Comments: 2 months ago tripped on throw rug, all throw rugs gone from home Restrictions Weight Bearing Restrictions: No       Mobility Bed Mobility   Bed Mobility: Supine to Sit     Supine to sit: Min guard;HOB elevated     General bed mobility comments: On toilet upon arrival  Transfers Overall transfer level: Needs assistance Equipment used: 4-wheeled walker Transfers: Sit to/from Stand Sit to Stand: Min assist Stand pivot transfers: (with rollator)       General transfer comment: Min-Mod A for transfers from toilet. Min A for safe descent to recliner    Balance Overall  balance assessment: Needs assistance Sitting-balance support: Feet supported;No upper extremity supported Sitting balance-Leahy Scale: Fair     Standing balance support: During functional activity;No upper extremity supported Standing balance-Leahy Scale: Fair Standing balance comment: more stable with RW support                           ADL either performed or assessed with clinical judgement   ADL Overall ADL's : Needs assistance/impaired     Grooming: Set up;Supervision/safety;Wash/dry hands;Standing Grooming Details (indicate cue type and reason): Pt performing hand hygiene at sink with supervision. Requiring cues to use soap.                 Toilet Transfer: Regular Clinical cytogeneticist;Ambulation;Grab bars;Moderate assistance(rollator) Toilet Transfer Details (indicate cue type and reason): Mod A to power up into standing from regular height toilet with use of grab bars Toileting- Clothing Manipulation and Hygiene: Min guard;Sit to/from stand Toileting - Clothing Manipulation Details (indicate cue type and reason): Min Guard A for safety during peri care. Pt able to perform toilet hygiene.     Functional mobility during ADLs: Min guard;Rolling walker General ADL Comments: Pt performing hand hygiene at sink with supervision. Providing pt with [facility] handout and education. Answering pt and family questions.      Vision       Perception     Praxis      Cognition Arousal/Alertness: Awake/alert Behavior During Therapy: WFL for tasks assessed/performed Overall Cognitive Status: Within Functional Limits for tasks assessed  Exercises    Shoulder Instructions       General Comments Wife present throughout session. Provdiing EC hand out and education. reviewing fully for carry over to home.     Pertinent Vitals/ Pain       Pain Assessment: No/denies pain Pain Score: 2  Pain Location: bil LE Pain Descriptors /  Indicators: Aching Pain Intervention(s): Limited activity within patient's tolerance;Repositioned  Home Living                                          Prior Functioning/Environment              Frequency  Min 2X/week        Progress Toward Goals  OT Goals(current goals can now be found in the care plan section)  Progress towards OT goals: Progressing toward goals  Acute Rehab OT Goals Patient Stated Goal: plan is to go home OT Goal Formulation: With patient Time For Goal Achievement: 10/27/17 Potential to Achieve Goals: Good ADL Goals Pt Will Perform Lower Body Dressing: with min guard assist;with adaptive equipment;sit to/from stand Pt Will Transfer to Toilet: with supervision;ambulating Pt Will Perform Toileting - Clothing Manipulation and hygiene: with supervision;sit to/from stand Additional ADL Goal #1: Pt will independently state 3 energy conservation techniques and utilize in session as needed.  Plan Discharge plan remains appropriate    Co-evaluation                 AM-PAC PT "6 Clicks" Daily Activity     Outcome Measure   Help from another person eating meals?: None Help from another person taking care of personal grooming?: A Little Help from another person toileting, which includes using toliet, bedpan, or urinal?: A Little Help from another person bathing (including washing, rinsing, drying)?: A Lot Help from another person to put on and taking off regular upper body clothing?: A Little Help from another person to put on and taking off regular lower body clothing?: A Lot 6 Click Score: 17    End of Session Equipment Utilized During Treatment: Gait belt;Oxygen;Other (comment)(rollator)  OT Visit Diagnosis: Other (comment);Muscle weakness (generalized) (M62.81)(decreased activity tolerance)   Activity Tolerance Patient tolerated treatment well   Patient Left in chair;with call bell/phone within reach;Other  (comment)(respiratory in room)   Nurse Communication Mobility status        Time: 6629-4765 OT Time Calculation (min): 19 min  Charges: OT General Charges $OT Visit: 1 Visit OT Treatments $Self Care/Home Management : 8-22 mins  [place], OTR/L Acute Rehab Pager: [phone] Office: [place] 10/22/2017, 1:26 PM

## 2017-10-22 NOTE — H&P (View-Only) (Signed)
Patient ID: [name], male   DOB: July 06, 1935, 82 y.o.   MRN: [id number]     Advanced Heart Failure Rounding Note  PCP-Cardiologist: [name], MD   Subjective:    Ria Comment with -3.1 L on lasix drip + metolazone. Weight down 2 more lbs. SBP 90-100s. BMET pending.   Denies orthopnea, dizziness, or SOB. Was not very active yesterday. He has been having intermittent chest discomfort that resolves with coughing. No BM since admission.   Objective:   Weight Range: 289 lb 8 oz (131.3 kg) Body mass index is 37.17 kg/m.   Vital Signs:   Temp:  [98.4 F (36.9 C)-98.6 F (37 C)] 98.6 F (37 C) (07/21 1924) Pulse Rate:  [65-81] 81 (07/22 0436) Resp:  [18-24] 18 (07/22 0436) BP: (97-105)/(56-64) 105/56 (07/22 0436) SpO2:  [94 %-98 %] 97 % (07/22 0436) Weight:  [289 lb 8 oz (131.3 kg)] 289 lb 8 oz (131.3 kg) (07/22 0436) Last BM Date: 10/18/17  Weight change: Filed Weights   10/20/17 0628 10/21/17 0450 10/22/17 0436  Weight: 295 lb 11.2 oz (134.1 kg) 291 lb 3.2 oz (132.1 kg) 289 lb 8 oz (131.3 kg)    Intake/Output:   Intake/Output Summary (Last 24 hours) at 10/22/2017 0742 Last data filed at 10/22/2017 0529 Gross per 24 hour  Intake 1286.47 ml  Output 4600 ml  Net -3313.53 ml      Physical Exam    General: . No resp difficulty. HEENT: Normal Neck: Supple. JVP ~10. Carotids 2+ bilat; no bruits. No thyromegaly or nodule noted. Cor: PMI nondisplaced. IRR, No M/G/R noted, mechanical S2, 2/6 SEM RUSB, 2/6 HSM apex Lungs: CTAB, normal effort. Abdomen: Soft, non-tender, non-distended, no HSM. No bruits or masses. +BS  Extremities: No cyanosis, clubbing, or rash. R and LLE 2+ edema into thighs Neuro: Alert & orientedx3, cranial nerves grossly intact. moves all 4 extremities w/o difficulty. Affect pleasant  Telemetry   Afib 60-70s with rare PVCs. Personally reviewed.   Labs    CBC No results for input(s): WBC, NEUTROABS, HGB, HCT, MCV, PLT in the last 72  hours. Basic Metabolic Panel Recent Labs    10/20/17 0457 10/21/17 0527  NA 142 139  K 3.0* 3.1*  CL 99 95*  CO2 36* 35*  GLUCOSE 138* 148*  BUN 30* 30*  CREATININE 1.50* 1.36*  CALCIUM 9.0 9.1  MG 1.8  --    Liver Function Tests No results for input(s): AST, ALT, ALKPHOS, BILITOT, PROT, ALBUMIN in the last 72 hours. No results for input(s): LIPASE, AMYLASE in the last 72 hours. Cardiac Enzymes No results for input(s): CKTOTAL, CKMB, CKMBINDEX, TROPONINI in the last 72 hours.  BNP: BNP (last 3 results) Recent Labs    08/01/17 1804 10/18/17 1510  BNP 784.4* 1,073.3*    ProBNP (last 3 results) Recent Labs    03/30/17 1147 05/25/17 1044 07/10/17 1523  PROBNP 1,858* 2,749* 411.0*     D-Dimer No results for input(s): DDIMER in the last 72 hours. Hemoglobin A1C No results for input(s): HGBA1C in the last 72 hours. Fasting Lipid Panel No results for input(s): CHOL, HDL, LDLCALC, TRIG, CHOLHDL, LDLDIRECT in the last 72 hours. Thyroid Function Tests No results for input(s): TSH, T4TOTAL, T3FREE, THYROIDAB in the last 72 hours.  Invalid input(s): FREET3  Other results:   Imaging    No results found.   Medications:     Scheduled Medications: . carvedilol  12.5 mg Oral BID WC  . ezetimibe-simvastatin  1 tablet Oral QHS  . folic acid  1 mg Oral Daily  . insulin aspart  0-15 Units Subcutaneous TID WC  . ipratropium-albuterol  3 mL Nebulization TID  . potassium chloride  60 mEq Oral BID  . sodium chloride flush  3 mL Intravenous Q12H  . spironolactone  25 mg Oral Daily  . vitamin B-12  3,000 mcg Oral Daily  . warfarin  2.5 mg Oral Once per day on Mon Fri  . warfarin  5 mg Oral Once per day on Sun Tue Wed Thu Sat  . Warfarin - Pharmacist Dosing Inpatient   Does not apply q1800    Infusions: . sodium chloride    . sodium chloride 20 mL/hr at 10/22/17 0529  . furosemide (LASIX) infusion 15 mg/hr (10/22/17 0529)    PRN Medications: sodium chloride,  acetaminophen, fluocinonide cream, ondansetron (ZOFRAN) IV, sodium chloride flush    Patient Profile   [name] is a 82 y.o. male with h/o mechanical AVR 1994, OSA on CPAP, Chronic afib, chronic combined CHF, CKD III, chronic coumadin therapy, CAD s/p CAGB 1994, and COPD.   Admitted from [facility] office 10/18/17 with Acute on chronic diastolic CHF.   Assessment/Plan   1. Acute on chronic diastolic CHF with prominent RV failure: Echo this admission showed EF 55-65%, mechanical aortic valve functioning normally, mild mitral stenosis/severe mitral regurgitation, RV moderately dilated with moderately decreased systolic function, PASP 49 mmHg.  Severe MR could be contributing to the significant RV failure. Volume status improving, but still elevated.  - Continue Lasix 15 mg/hr. Consider another dose of metolazone if creatinine stable.  - Continue spironolactone 25 mg daily.  - Will need closer evaluation of mitral valve by TEE.  If MR is severe, would consider Mitraclip, but if there is significant mitral stenosis he may not be a good candidate. TEE ordered for today. Called and confirmed that he is an add on for today.  - Still waiting for unna boots, hospital is out of coban 3. Mechanical aortic valve: Appears to function well on echo this admission. As above, going for TEE today.  - Continue warfarin with INR goal 2.5-3.5 (give RFs of CHF and chronic afib). INR 2.01. Coumadin per pharmacy.  4. Atrial fibrillation: Chronic.  - Continue Coreg 12.5 mg bid, HR is controlled.  - Continue warfarin. INR 2.01 5. CAD: s/p CABG.  No CP.  - Continue Vytorin.  6. COPD: On home oxygen. No change.  7. OSA: Continue home CPAP. No change.  8. CKD: Stage 3.  Follow creatinine closely with diuresis, stable so far. BMET pending.  9. Constipation - Add colace and senna 10. Deconditioning - PT recommending HH PT and 24 hour supervision  Length of Stay: [place], NP  10/22/2017, 7:42  AM  Advanced Heart Failure Team Pager [phone] (M-F; 7a - 4p)  Please contact [facility] for night-coverage after hours (4p -7a ) and weekends on [email or web address]  Patient seen with NP, agree with the above note.  Creatinine stable at 1.37.   On exam, he remains volume overloaded. He is diuresing well so far with Lasix gtt + metolazone.   - Will give metolazone 2.5 x 1 again today.   Needs TEE to assess MV, talked to endo and will make tomorrow at 1 pm since need anesthesia and not sure we can get that today. He can eat today.   Loralie Champagne 10/22/2017 8:51 AM

## 2017-10-22 NOTE — Progress Notes (Signed)
OT Treatment  Pt progressing towards established OT goals. Pt performing functional mobility with rollator with Min guard A and toilet transfer with Min A and grab bar for safe descent. Pt requesting to sit at toilet for increased time. Pt continues to present with decreased strength and balance. Continue to recommend dc to home with HHOT and will continue to follow acutely to facilitate safe dc.    10/22/17 1000  OT Visit Information  Last OT Received On 10/22/17  Assistance Needed +1  History of Present Illness 82 y.o. male who presents for with mechanical aortic valve 1994, obstructive sleep apnea on chronic CPAP, chronic atrial fibrillation, chronic combined systolic and diastolic heart failure, chronic stage III CKD, Coumadin therapy, prior CABG 1994, and recently diagnosed with COPD and placed on chronic oxygen therapy by hospitalist during an early June 2019 admission Progressive abdominal swelling, lower extremity swelling with anasarca over the past 6 weeks  Precautions  Precautions Fall  Precaution Comments 2 months ago tripped on throw rug, all throw rugs gone from home  Pain Assessment  Pain Assessment No/denies pain  Cognition  Arousal/Alertness Awake/alert  Behavior During Therapy WFL for tasks assessed/performed  Overall Cognitive Status Within Functional Limits for tasks assessed  Upper Extremity Assessment  Upper Extremity Assessment LUE deficits/detail;RUE deficits/detail  RUE Deficits / Details limited AROM shoulder flexion-approximately 90 degrees  LUE Deficits / Details limited AROM shoulder flexion-approximately 90 degrees  Lower Extremity Assessment  Lower Extremity Assessment Defer to PT evaluation  ADL  Overall ADL's  Needs assistance/impaired  Toilet Transfer Minimal assistance;Regular Toilet;Ambulation;Grab bars;Moderate assistance (rollator)  Toilet Transfer Details (indicate cue type and reason) Min A for safe descent to regular height toilet.  Functional  mobility during ADLs Min guard;Rolling walker  General ADL Comments Pt performing functional mobility with RW to bathroom and requiring Min A for safety in descent to toilet. Pt requiring incraesed time for BM.   Bed Mobility  General bed mobility comments In recliner upon arrival  Balance  Overall balance assessment Needs assistance  Sitting-balance support Feet supported;No upper extremity supported  Sitting balance-Leahy Scale Fair  Standing balance support During functional activity;No upper extremity supported  Standing balance-Leahy Scale Fair  Standing balance comment more stable with RW support  Restrictions  Weight Bearing Restrictions No  Transfers  Overall transfer level Needs assistance  Equipment used 4-wheeled walker  Transfers Sit to/from Stand  Sit to Stand Min assist  General transfer comment Min A for safe descent.  General Comments  General comments (skin integrity, edema, etc.) Wife present throughout session  OT - End of Session  Equipment Utilized During Treatment Gait belt;Oxygen;Other (comment) (rollator)  Activity Tolerance Patient tolerated treatment well  Patient left in chair;with call bell/phone within reach;Other (comment) (respiratory in room)  OT Assessment/Plan  OT Plan Discharge plan remains appropriate  OT Visit Diagnosis Other (comment);Muscle weakness (generalized) (M62.81) (decreased activity tolerance)  OT Frequency (ACUTE ONLY) Min 2X/week  Follow Up Recommendations Home health OT;Supervision - Intermittent  OT Equipment None recommended by OT  AM-PAC OT "6 Clicks" Daily Activity Outcome Measure  Help from another person eating meals? 4  Help from another person taking care of personal grooming? 3  Help from another person toileting, which includes using toliet, bedpan, or urinal? 3  Help from another person bathing (including washing, rinsing, drying)? 2  Help from another person to put on and taking off regular upper body clothing? 3   Help from another person to put on and taking  off regular lower body clothing? 2  6 Click Score 17  ADL G Code Conversion CK  OT Goal Progression  Progress towards OT goals Progressing toward goals  Acute Rehab OT Goals  Patient Stated Goal plan is to go home  OT Goal Formulation With patient  Time For Goal Achievement 10/27/17  Potential to Achieve Goals Good  ADL Goals  Pt Will Perform Toileting - Clothing Manipulation and hygiene with supervision;sit to/from stand  Pt Will Perform Lower Body Dressing with min guard assist;with adaptive equipment;sit to/from stand  Pt Will Transfer to Toilet with supervision;ambulating  Additional ADL Goal #1 Pt will independently state 3 energy conservation techniques and utilize in session as needed.  OT Time Calculation  OT Start Time (ACUTE ONLY) 1016  OT Stop Time (ACUTE ONLY) 1026  OT Time Calculation (min) 10 min  OT General Charges  $OT Visit 1 Visit  OT Treatments  $Self Care/Home Management  8-22 mins   Amorita Vanrossum MSOT, OTR/L Acute Rehab Pager: [phone] Office: [phone]

## 2017-10-22 NOTE — Progress Notes (Signed)
Physical Therapy Treatment Patient Details Name: [name] MRN: [id number] DOB: 23-Dec-1935 Today's Date: 10/22/2017    History of Present Illness 82 y.o. male admitted with anasarca, CHF. PMHx: AVR, OSA, AFib, CHF, CKD, CABG, recently diagnosed with COPD and on home O2    PT Comments    Pt pleasant and demonstrating increased gait tolerance today. Pt educated for bil LE HEP and encouraged to continue to perform along with increased gait with nursing. Pt reports still unable to have BM and educated for increased mobility assisting with this. Will continue to follow. Unable to obtain pulse ox reading throughout session with pt on 2L at rest and 4L with gait based on prior session. HR 82-86     Follow Up Recommendations  Home health PT;Supervision/Assistance - 24 hour     Equipment Recommendations  None recommended by PT    Recommendations for Other Services       Precautions / Restrictions Precautions Precautions: Fall Precaution Comments:  Restrictions Weight Bearing Restrictions: No    Mobility  Bed Mobility Overal bed mobility: Needs Assistance Bed Mobility: Supine to Sit     Supine to sit: Min guard;HOB elevated    General bed mobility comments: pt able to exit bed with HOB 25 degrees with increased time and no physical assist  Transfers Overall transfer level: Needs assistance Equipment used: 4-wheeled walker Transfers: Sit to/from Stand Sit to Stand: Min guard;From elevated surface Stand pivot transfers: Min guard(with rollator)       General transfer comment: elevated bed height to rise  Ambulation/Gait Ambulation/Gait assistance: Min guard Gait Distance (Feet): 200 Feet Assistive device: 4-wheeled walker Gait Pattern/deviations: Step-through pattern;Decreased stride length;Wide base of support   Gait velocity interpretation: <1.8 ft/sec, indicate of risk for recurrent falls General Gait Details: guarding for safety with encouragement to maximize  distance   Stairs             Wheelchair Mobility    Modified Rankin (Stroke Patients Only)       Balance Overall balance assessment: Needs assistance Sitting-balance support: Feet supported;No upper extremity supported Sitting balance-Leahy Scale: Fair     Standing balance support: Bilateral upper extremity supported Standing balance-Leahy Scale: Poor Standing balance comment: Rollator for standing and gait                            Cognition Arousal/Alertness: Awake/alert Behavior During Therapy: WFL for tasks assessed/performed Overall Cognitive Status: Within Functional Limits for tasks assessed                                        Exercises General Exercises - Lower Extremity Long Arc Quad: AROM;15 reps;Seated;Both Hip Flexion/Marching: AROM;10 reps;Seated;Both    General Comments        Pertinent Vitals/Pain Pain Assessment: 0-10 Pain Score: 2  Pain Location: bil LE Pain Descriptors / Indicators: Aching Pain Intervention(s): Limited activity within patient's tolerance;Repositioned    Home Living                      Prior Function            PT Goals (current goals can now be found in the care plan section) Acute Rehab PT Goals Patient Stated Goal: plan is to go home Progress towards PT goals: Progressing toward goals    Frequency  PT Plan Current plan remains appropriate    Co-evaluation              AM-PAC PT "6 Clicks" Daily Activity  Outcome Measure  Difficulty turning over in bed (including adjusting bedclothes, sheets and blankets)?: A Little Difficulty moving from lying on back to sitting on the side of the bed? : A Lot Difficulty sitting down on and standing up from a chair with arms (e.g., wheelchair, bedside commode, etc,.)?: A Lot Help needed moving to and from a bed to chair (including a wheelchair)?: A Little Help needed walking in hospital room?: A Little Help  needed climbing 3-5 steps with a railing? : A Lot 6 Click Score: 15    End of Session Equipment Utilized During Treatment: Gait belt;Oxygen Activity Tolerance: Patient tolerated treatment well Patient left: with call bell/phone within reach;with family/visitor present;with chair alarm set;in chair Nurse Communication: Mobility status PT Visit Diagnosis: Unsteadiness on feet (R26.81);Other abnormalities of gait and mobility (R26.89);Muscle weakness (generalized) (M62.81);History of falling (Z91.81)     Time: 0934-1000 PT Time Calculation (min) (ACUTE ONLY): 26 min  Charges:  $Gait Training: 8-22 mins $Therapeutic Exercise: 8-22 mins                    G Codes:       Elwyn Reach, PT [phone]    [place] 10/22/2017, 11:20 AM

## 2017-10-22 NOTE — Progress Notes (Signed)
Pt placed on NIV at this time per patient request Tolerating it well no distress or complications noted

## 2017-10-22 NOTE — Progress Notes (Signed)
ANTICOAGULATION CONSULT NOTE - Follow-up Consult  Pharmacy Consult for Coumadin Indication: mechanical aortic valve + Afib  Allergies  Allergen Reactions  . Rifampin Rash    May have been caused by Vancomycin or Rifampin (??)  . Vancomycin Rash    May have been caused by Vancomycin or Rifampin (??)    Patient Measurements: Height: 6\' 2"  (188 cm) Weight: 289 lb 8 oz (131.3 kg)(scale a) IBW/kg (Calculated) : 82.2 Heparin Dosing Weight: n/a   Vital Signs: Temp: 97.6 F (36.4 C) (07/22 1210) Temp Source: Oral (07/22 1210) BP: 92/47 (07/22 1210) Pulse Rate: 57 (07/22 1210)  Labs: Recent Labs    10/20/17 0457 10/21/17 0527 10/22/17 0539  LABPROT 31.3* 26.0* 22.6*  INR 3.04 2.40 2.01  CREATININE 1.50* 1.36* 1.37*    Estimated Creatinine Clearance: 61.9 mL/min (A) (by C-G formula based on SCr of 1.37 mg/dL (H)).   Medical History: Past Medical History:  Diagnosis Date  . Aortic stenosis   . Carotid artery disease ([place]) 1994   s/p left carotid endarerectomy   . CHF with unknown LVEF ([facility])    47 %  . Chronic atrial fibrillation ([place])   . Hx of CABG   . Hypertension   . Left ventricular dysfunction 2007   LVEF 47%   . OSA (obstructive sleep apnea)   . Rheumatic fever   . Subclavian bypass stenosis ([place])   . Vitamin B12 deficiency     Medications:  Scheduled:  . carvedilol  12.5 mg Oral BID WC  . docusate sodium  100 mg Oral BID  . ezetimibe-simvastatin  1 tablet Oral QHS  . folic acid  1 mg Oral Daily  . insulin aspart  0-15 Units Subcutaneous TID WC  . ipratropium-albuterol  3 mL Nebulization TID  . potassium chloride  60 mEq Oral BID  . sodium chloride flush  3 mL Intravenous Q12H  . spironolactone  25 mg Oral Daily  . vitamin B-12  3,000 mcg Oral Daily  . warfarin  7.5 mg Oral ONCE-1800  . Warfarin - Pharmacist Dosing Inpatient   Does not apply q1800    Assessment: 82 yo male on chronic Coumadin for mechanical aortic valve + afib. PTA Coumadin dose  5 mg daily except 2.5 mg on Mon, Fri. Today's INR subtherapeutic at 2.0 INR has been therapeutic over previous three days while pt has been on home dose of coumadin. Will give a boost today but if not close to goal tomorrow may need heparin drip.    Goal of Therapy:  INR 2.5-3.5 Monitor platelets by anticoagulation protocol: Yes   Plan:  1. Warfarin 7.5 mg x 1 tonight 2. Daily PT/INR.  Bonnita Nasuti Pharm.D. CPP, BCPS Clinical Pharmacist [phone] 10/22/2017 1:57 PM

## 2017-10-22 NOTE — Progress Notes (Signed)
Orthopedic Tech Progress Note Patient Details:  [name] Sep 12, 1935 [id number]  Ortho Devices Type of Ortho Device: Ace wrap, Unna boot Ortho Device/Splint Location: Bilateral unna boots Ortho Device/Splint Interventions: Application   Post Interventions Patient Tolerated: Well Instructions Provided: Care of device   [name] 10/22/2017, 5:48 PM

## 2017-10-23 ENCOUNTER — Inpatient Hospital Stay (HOSPITAL_COMMUNITY): Payer: Medicare Other | Admitting: Certified Registered Nurse Anesthetist

## 2017-10-23 ENCOUNTER — Encounter (HOSPITAL_COMMUNITY)
Admission: AD | Disposition: A | Discharge status: Home Health | Payer: Self-pay | Source: Ambulatory Visit | Attending: Interventional Cardiology

## 2017-10-23 ENCOUNTER — Telehealth: Payer: Self-pay | Admitting: Cardiology

## 2017-10-23 ENCOUNTER — Encounter (HOSPITAL_COMMUNITY): Payer: Self-pay | Admitting: *Deleted

## 2017-10-23 ENCOUNTER — Inpatient Hospital Stay (HOSPITAL_COMMUNITY): Payer: Medicare Other

## 2017-10-23 DIAGNOSIS — I481 Persistent atrial fibrillation: Secondary | ICD-10-CM

## 2017-10-23 DIAGNOSIS — I340 Nonrheumatic mitral (valve) insufficiency: Secondary | ICD-10-CM

## 2017-10-23 HISTORY — PX: TEE WITHOUT CARDIOVERSION: SHX5443

## 2017-10-23 LAB — HEPARIN LEVEL (UNFRACTIONATED): HEPARIN UNFRACTIONATED: 0.46 [IU]/mL (ref 0.30–0.70)

## 2017-10-23 LAB — MAGNESIUM: Magnesium: 1.8 mg/dL (ref 1.7–2.4)

## 2017-10-23 LAB — GLUCOSE, CAPILLARY
GLUCOSE-CAPILLARY: 120 mg/dL — AB (ref 70–99)
GLUCOSE-CAPILLARY: 177 mg/dL — AB (ref 70–99)
Glucose-Capillary: 104 mg/dL — ABNORMAL HIGH (ref 70–99)
Glucose-Capillary: 99 mg/dL (ref 70–99)

## 2017-10-23 LAB — BASIC METABOLIC PANEL
Anion gap: 10 (ref 5–15)
BUN: 34 mg/dL — AB (ref 8–23)
CHLORIDE: 92 mmol/L — AB (ref 98–111)
CO2: 40 mmol/L — ABNORMAL HIGH (ref 22–32)
CREATININE: 1.23 mg/dL (ref 0.61–1.24)
Calcium: 9.5 mg/dL (ref 8.9–10.3)
GFR calc Af Amer: >60 mL/min (ref >60)
GFR calc non Af Amer: 54 mL/min — ABNORMAL LOW (ref >60)
Glucose, Bld: 125 mg/dL — ABNORMAL HIGH (ref 70–99)
Potassium: 3.8 mmol/L (ref 3.5–5.1)
SODIUM: 142 mmol/L (ref 135–145)

## 2017-10-23 LAB — PROTIME-INR
INR: 1.94
PROTHROMBIN TIME: 22 s — AB (ref 11.4–15.2)

## 2017-10-23 SURGERY — ECHOCARDIOGRAM, TRANSESOPHAGEAL
Anesthesia: Monitor Anesthesia Care

## 2017-10-23 MED ORDER — MAGNESIUM SULFATE 2 GM/50ML IV SOLN
2.0000 g | Freq: Once | INTRAVENOUS | Status: AC
  Administered 2017-10-23: 2 g via INTRAVENOUS
  Filled 2017-10-23: qty 50

## 2017-10-23 MED ORDER — POTASSIUM CHLORIDE CRYS ER 20 MEQ PO TBCR
40.0000 meq | EXTENDED_RELEASE_TABLET | Freq: Once | ORAL | Status: AC
  Administered 2017-10-23: 40 meq via ORAL

## 2017-10-23 MED ORDER — PROPOFOL 10 MG/ML IV BOLUS
INTRAVENOUS | Status: DC | PRN
  Administered 2017-10-23: 30 mg via INTRAVENOUS
  Administered 2017-10-23: 10 mg via INTRAVENOUS
  Administered 2017-10-23: 20 mg via INTRAVENOUS
  Administered 2017-10-23: 30 mg via INTRAVENOUS
  Administered 2017-10-23: 20 mg via INTRAVENOUS

## 2017-10-23 MED ORDER — BUTAMBEN-TETRACAINE-BENZOCAINE 2-2-14 % EX AERO
INHALATION_SPRAY | CUTANEOUS | Status: DC | PRN
  Administered 2017-10-23: 1 via TOPICAL

## 2017-10-23 MED ORDER — SODIUM CHLORIDE 0.9 % IV SOLN: INTRAVENOUS | Status: DC

## 2017-10-23 MED ORDER — HEPARIN (PORCINE) IN NACL 100-0.45 UNIT/ML-% IJ SOLN
1400.0000 [IU]/h | INTRAMUSCULAR | Status: DC
Start: 2017-10-23 — End: 2017-10-24
  Administered 2017-10-23 – 2017-10-24 (×2): 1400 [IU]/h via INTRAVENOUS
  Filled 2017-10-23 (×2): qty 250

## 2017-10-23 MED ORDER — IPRATROPIUM-ALBUTEROL 0.5-2.5 (3) MG/3ML IN SOLN: 3.0000 mL | RESPIRATORY_TRACT | Status: DC | PRN

## 2017-10-23 MED ORDER — METOLAZONE 2.5 MG PO TABS
2.5000 mg | ORAL_TABLET | Freq: Once | ORAL | Status: AC
  Administered 2017-10-23: 2.5 mg via ORAL
  Filled 2017-10-23: qty 1

## 2017-10-23 MED ORDER — LIDOCAINE HCL (CARDIAC) PF 100 MG/5ML IV SOSY
PREFILLED_SYRINGE | INTRAVENOUS | Status: DC | PRN
  Administered 2017-10-23: 40 mg via INTRAVENOUS

## 2017-10-23 MED ORDER — PHENYLEPHRINE 40 MCG/ML (10ML) SYRINGE FOR IV PUSH (FOR BLOOD PRESSURE SUPPORT)
PREFILLED_SYRINGE | INTRAVENOUS | Status: DC | PRN
  Administered 2017-10-23 (×2): 100 ug via INTRAVENOUS
  Administered 2017-10-23: 80 ug via INTRAVENOUS

## 2017-10-23 MED ORDER — WARFARIN SODIUM 7.5 MG PO TABS
7.5000 mg | ORAL_TABLET | Freq: Once | ORAL | Status: AC
  Administered 2017-10-23: 7.5 mg via ORAL
  Filled 2017-10-23: qty 1

## 2017-10-23 MED ORDER — EPHEDRINE SULFATE-NACL 50-0.9 MG/10ML-% IV SOSY
PREFILLED_SYRINGE | INTRAVENOUS | Status: DC | PRN
  Administered 2017-10-23: 10 mg via INTRAVENOUS

## 2017-10-23 NOTE — CV Procedure (Signed)
Procedure: TEE  Indication: Mitral regurgitation  Sedation: Per anesthesiology  Findings: Please see echo section for full report.  Normal LV size with mild LV hypertrophy.  EF 55-60%, no regional wall motion abnormalities.  D-shaped septum consistent with RV pressure/volume overload.  The right ventricle was mildly dilated with mildly decreased systolic function.  Severe left atrial enlargement, no LA appendage thrombus.  Moderate right atrial enlargement.  No ASD/PFO by color doppler.  Mild tricuspid regurgitation with peak RV-RA gradient 25 mmHg.  Mechanical aortic valve with no significant regurgitation, unable to obtain mean gradient as transgastric windows were poor.  The mitral valve was calcified, especially the posterior leaflet that was restricted.  Mean gradient 5 mmHg but MVA by PHT > 2 cm^2.  I think there was only minimal mitral stenosis.  There was severe mitral regurgitation, likely due to restriction of posterior leaflet and annular dilation from severe LAE (mixed functional and primary etiology).  ERO 0.42 cm^2 by PISA.  There was systolic flow reversal in the pulmonary vein doppler pattern. Normal caliber ascending aorta.   Impression:  Severe mitral regurgitation with minimal mitral stenosis.  Suspect mixed primary and secondary etiology.   [name] 10/23/2017 1:36 PM

## 2017-10-23 NOTE — Transfer of Care (Signed)
Immediate Anesthesia Transfer of Care Note  Patient: [name]  Procedure(s) Performed: TRANSESOPHAGEAL ECHOCARDIOGRAM (TEE) (N/A )  Patient Location: PACU  Anesthesia Type:MAC  Level of Consciousness: awake, alert , oriented and patient cooperative  Airway & Oxygen Therapy: Patient Spontanous Breathing and Patient connected to face mask oxygen  Post-op Assessment: Report given to RN and Post -op Vital signs reviewed and stable  Post vital signs: Reviewed and stable  Last Vitals:  Vitals Value Taken Time  BP    Temp    Pulse 76 10/23/2017  1:49 PM  Resp 22 10/23/2017  1:49 PM  SpO2 99 % 10/23/2017  1:49 PM  Vitals shown include unvalidated device data.  Last Pain:  Vitals:   10/23/17 1340  TempSrc:   PainSc: 0-No pain         Complications: No apparent anesthesia complications

## 2017-10-23 NOTE — Progress Notes (Signed)
OT Cancellation    10/23/17 1100  OT Visit Information  Last OT Received On 10/23/17  Reason Eval/Treat Not Completed Patient at procedure or test/ unavailable (Endo. Will return as schedule allows. Thank you)   [place], OTR/L Acute Rehab Pager: [phone] Office: [phone]

## 2017-10-23 NOTE — Telephone Encounter (Signed)
[place] and was on hold several minutes before VM answered. Left message that the order for new PAP was placed and that [facility] needs to have someone pull the order so the patient can get his supplies. Left message to call back to confirm.

## 2017-10-23 NOTE — Progress Notes (Signed)
Returned from endo via bed. Awake alert offers no c/o.  Wife at side.  No difficulty noted or c/o.

## 2017-10-23 NOTE — Progress Notes (Signed)
See full CM Consult note on 10/19/2017 [place] arranged with [place], lives with spouse; Aneta Mins [phone]

## 2017-10-23 NOTE — Progress Notes (Signed)
ANTICOAGULATION CONSULT NOTE - Follow-up Consult  Pharmacy Consult for heparin Indication: mechanical aortic valve + Afib  Allergies  Allergen Reactions  . Rifampin Rash    May have been caused by Vancomycin or Rifampin (??)  . Vancomycin Rash    May have been caused by Vancomycin or Rifampin (??)    Patient Measurements: Height: 6\' 2"  (188 cm) Weight: 280 lb 3.2 oz (127.1 kg) IBW/kg (Calculated) : 82.2 Heparin Dosing Weight: n/a   Vital Signs: Temp: 97 F (36.1 C) (07/23 1935) Temp Source: Oral (07/23 1337) BP: 92/72 (07/23 1935) Pulse Rate: 70 (07/23 2008)  Labs: Recent Labs    10/21/17 0527 10/22/17 0539 10/23/17 3557 10/23/17 2225  LABPROT 26.0* 22.6* 22.0*  --   INR 2.40 2.01 1.94  --   HEPARINUNFRC  --   --   --  0.46  CREATININE 1.36* 1.37* 1.23  --     Estimated Creatinine Clearance: 67.9 mL/min (by C-G formula based on SCr of 1.23 mg/dL).   Medical History: Past Medical History:  Diagnosis Date  . Carotid artery disease ([place]) 1994   s/p left carotid endarerectomy   . Chronic atrial fibrillation (H[facility])    a. on coumadin   . Chronic diastolic CHF (congestive heart failure) ([place])   . Hx of CABG    a. 1994  . Hypertension   . OSA (obstructive sleep apnea)   . Rheumatic fever   . S/P AVR (aortic valve replacement)    a. mechanical valve 1996  . Subclavian bypass stenosis ([place])   . Vitamin B12 deficiency     Medications:  Scheduled:  . carvedilol  12.5 mg Oral BID WC  . docusate sodium  100 mg Oral BID  . ezetimibe-simvastatin  1 tablet Oral QHS  . folic acid  1 mg Oral Daily  . insulin aspart  0-15 Units Subcutaneous TID WC  . potassium chloride  60 mEq Oral BID  . sodium chloride flush  3 mL Intravenous Q12H  . spironolactone  25 mg Oral Daily  . vitamin B-12  3,000 mcg Oral Daily  . Warfarin - Pharmacist Dosing Inpatient   Does not apply q1800    Assessment: 82 yo male on chronic Coumadin for mechanical aortic valve + afib. PTA Coumadin  dose 5 mg daily except 2.5 mg on Mon, Fri.  Today's INR subtherapeutic at 1.9, INR has been therapeutic over previous three days while pt has been on home dose of coumadin. Heparin drip started at 1400 units/hr.  Heparin level this evening 0.46 units/ml  Goal of Therapy:  Heparin level 0.3-0.7 units/ml INR 2.5-3.5 Monitor platelets by anticoagulation protocol: Yes   Plan:  Continue heparin at 1400 units/hr F/u am labs  Excell Seltzer, PharmD Clinical Pharmacist 10/23/2017 11:00 PM

## 2017-10-23 NOTE — Telephone Encounter (Signed)
New Message   Pt's wife is calling, states that the pt is suppose to have his cpap machine upgraded but [facility] informed them they Dr. Radford Pax would have to either call or send orders

## 2017-10-23 NOTE — Progress Notes (Signed)
Pt reported some discomfort in right arm where iv fluids are infusing, IV was flushed and seems to be working, he reported he wanted to give that arm some rest if we can use the other arm, IV team consulted. Will continue to monitor.

## 2017-10-23 NOTE — Progress Notes (Signed)
ANTICOAGULATION CONSULT NOTE - Follow-up Consult  Pharmacy Consult for Coumadin Indication: mechanical aortic valve + Afib  Allergies  Allergen Reactions  . Rifampin Rash    May have been caused by Vancomycin or Rifampin (??)  . Vancomycin Rash    May have been caused by Vancomycin or Rifampin (??)    Patient Measurements: Height: 6\' 2"  (188 cm) Weight: 280 lb 3.2 oz (127.1 kg) IBW/kg (Calculated) : 82.2 Heparin Dosing Weight: n/a   Vital Signs: Temp: 98.4 F (36.9 C) (07/23 1400) Temp Source: Oral (07/23 1337) BP: 112/64 (07/23 1400) Pulse Rate: 78 (07/23 1400)  Labs: Recent Labs    10/21/17 0527 10/22/17 0539 10/23/17 0638  LABPROT 26.0* 22.6* 22.0*  INR 2.40 2.01 1.94  CREATININE 1.36* 1.37* 1.23    Estimated Creatinine Clearance: 67.9 mL/min (by C-G formula based on SCr of 1.23 mg/dL).   Medical History: Past Medical History:  Diagnosis Date  . Aortic stenosis   . Carotid artery disease ([place]) 1994   s/p left carotid endarerectomy   . CHF with unknown LVEF (HCC)    47 %  . Chronic atrial fibrillation ([place])   . Hx of CABG   . Hypertension   . Left ventricular dysfunction 2007   LVEF 47%   . OSA (obstructive sleep apnea)   . Rheumatic fever   . Subclavian bypass stenosis ([place])   . Vitamin B12 deficiency     Medications:  Scheduled:  . carvedilol  12.5 mg Oral BID WC  . docusate sodium  100 mg Oral BID  . ezetimibe-simvastatin  1 tablet Oral QHS  . folic acid  1 mg Oral Daily  . insulin aspart  0-15 Units Subcutaneous TID WC  . metolazone  2.5 mg Oral Once  . potassium chloride  40 mEq Oral Once  . potassium chloride  60 mEq Oral BID  . sodium chloride flush  3 mL Intravenous Q12H  . spironolactone  25 mg Oral Daily  . vitamin B-12  3,000 mcg Oral Daily  . Warfarin - Pharmacist Dosing Inpatient   Does not apply q1800    Assessment: 82 yo male on chronic Coumadin for mechanical aortic valve + afib. PTA Coumadin dose 5 mg daily except 2.5 mg  on Mon, Fri.  Today's INR subtherapeutic at 1.9, INR has been therapeutic over previous three days while pt has been on home dose of coumadin. Will give a boost today but if not close to goal tomorrow may need heparin drip.    Goal of Therapy:  INR 2.5-3.5 Monitor platelets by anticoagulation protocol: Yes   Plan:  1. Warfarin 7.5 mg x 1 tonight 2. Daily PT/INR.  Erin Hearing PharmD., BCPS Clinical Pharmacist 10/23/2017 2:06 PM

## 2017-10-23 NOTE — Progress Notes (Addendum)
Patient ID: [name], male   DOB: 11/24/1935, 82 y.o.   MRN: [id number]     Advanced Heart Failure Rounding Note  PCP-Cardiologist: [name], MD   Subjective:    Great diuresis with -3.5 L on lasix drip + metolazone. Weight down another 8 lbs. BMET pending. SBP 90-100s  Denies CP, SOB, or dizziness. Walked with PT/OT yesterday. RUE IV infiltrated overnight.   He was eating cherries when I walked into the room. He is NPO for TEE this afternoon.   Objective:   Weight Range: 280 lb 3.2 oz (127.1 kg) Body mass index is 35.98 kg/m.   Vital Signs:   Temp:  [97.6 F (36.4 C)-98 F (36.7 C)] 97.7 F (36.5 C) (07/23 0513) Pulse Rate:  [56-84] 56 (07/23 0513) Resp:  [18] 18 (07/23 0513) BP: (90-103)/(47-54) 90/54 (07/23 0513) SpO2:  [93 %-99 %] 99 % (07/23 0513) Weight:  [280 lb 3.2 oz (127.1 kg)] 280 lb 3.2 oz (127.1 kg) (07/23 0513) Last BM Date: 10/22/17  Weight change: Filed Weights   10/21/17 0450 10/22/17 0436 10/23/17 0513  Weight: 291 lb 3.2 oz (132.1 kg) 289 lb 8 oz (131.3 kg) 280 lb 3.2 oz (127.1 kg)    Intake/Output:   Intake/Output Summary (Last 24 hours) at 10/23/2017 0729 Last data filed at 10/23/2017 3762 Gross per 24 hour  Intake 1093.35 ml  Output 4600 ml  Net -3506.65 ml      Physical Exam    General: No resp difficulty. HEENT: Normal Neck: Supple. JVP ~8-10. Carotids 2+ bilat; no bruits. No thyromegaly or nodule noted. Cor: PMI nondisplaced. IRR, mechanical S2, 2/6 SEM RUSB, 2/6 HSM apex Lungs: CTAB, normal effort. Abdomen: Soft, non-tender, non-distended, no HSM. No bruits or masses. +BS  Extremities: No cyanosis, clubbing, or rash. R and LLE 2+ edema into thighs. BLE unna boots in place. RUE nonpitting edema.  Neuro: Alert & orientedx3, cranial nerves grossly intact. moves all 4 extremities w/o difficulty. Affect pleasant  Telemetry   Afib 60-70s. Personally reviewed.   Labs    CBC No results for input(s): WBC, NEUTROABS, HGB,  HCT, MCV, PLT in the last 72 hours. Basic Metabolic Panel Recent Labs    10/21/17 0527 10/22/17 0539  NA 139 143  K 3.1* 3.4*  CL 95* 95*  CO2 35* 38*  GLUCOSE 148* 112*  BUN 30* 34*  CREATININE 1.36* 1.37*  CALCIUM 9.1 9.3  MG  --  1.9   Liver Function Tests No results for input(s): AST, ALT, ALKPHOS, BILITOT, PROT, ALBUMIN in the last 72 hours. No results for input(s): LIPASE, AMYLASE in the last 72 hours. Cardiac Enzymes No results for input(s): CKTOTAL, CKMB, CKMBINDEX, TROPONINI in the last 72 hours.  BNP: BNP (last 3 results) Recent Labs    08/01/17 1804 10/18/17 1510  BNP 784.4* 1,073.3*    ProBNP (last 3 results) Recent Labs    03/30/17 1147 05/25/17 1044 07/10/17 1523  PROBNP 1,858* 2,749* 411.0*     D-Dimer No results for input(s): DDIMER in the last 72 hours. Hemoglobin A1C No results for input(s): HGBA1C in the last 72 hours. Fasting Lipid Panel No results for input(s): CHOL, HDL, LDLCALC, TRIG, CHOLHDL, LDLDIRECT in the last 72 hours. Thyroid Function Tests No results for input(s): TSH, T4TOTAL, T3FREE, THYROIDAB in the last 72 hours.  Invalid input(s): FREET3  Other results:   Imaging    No results found.   Medications:     Scheduled Medications: . carvedilol  12.5  mg Oral BID WC  . docusate sodium  100 mg Oral BID  . ezetimibe-simvastatin  1 tablet Oral QHS  . folic acid  1 mg Oral Daily  . insulin aspart  0-15 Units Subcutaneous TID WC  . potassium chloride  60 mEq Oral BID  . sodium chloride flush  3 mL Intravenous Q12H  . spironolactone  25 mg Oral Daily  . vitamin B-12  3,000 mcg Oral Daily  . Warfarin - Pharmacist Dosing Inpatient   Does not apply q1800    Infusions: . sodium chloride    . sodium chloride 20 mL/hr at 10/22/17 1800  . sodium chloride    . furosemide (LASIX) infusion 15 mg/hr (10/22/17 1920)    PRN Medications: sodium chloride, acetaminophen, fluocinonide cream, ipratropium-albuterol, ondansetron  (ZOFRAN) IV, senna, sodium chloride flush    Patient Profile   [name] is a 82 y.o. male with h/o mechanical AVR 1994, OSA on CPAP, Chronic afib, chronic combined CHF, CKD III, chronic coumadin therapy, CAD s/p CAGB 1994, and COPD.   [facility] office [facility]18/17 with Acute on chronic diastolic CHF.   Assessment/Plan   1. Acute on chronic diastolic CHF with prominent RV failure: Echo this admission showed EF 55-65%, mechanical aortic valve functioning normally, mild mitral stenosis/severe mitral regurgitation, RV moderately dilated with moderately decreased systolic function, PASP 49 mmHg.  Severe MR could be contributing to the significant RV failure. Volume status improving, but remains elevated. - Continue Lasix 15 mg/hr. Consider metolazone again today if creatinine remains stable. BMET pending.  - Continue spironolactone 25 mg daily.  - Will need closer evaluation of mitral valve by TEE.  If MR is severe, would consider Mitraclip, but if there is significant mitral stenosis he may not be a good candidate. Going for TEE today - Continue unna boots 3. Mechanical aortic valve: Appears to function well on echo this admission. As above, going for TEE today.  - Continue warfarin with INR goal 2.5-3.5 (give RFs of CHF and chronic afib). Coumadin per pharmacy. INR pending.  4. Atrial fibrillation: Chronic.  - Continue Coreg 12.5 mg bid, HR is controlled.  - Continue warfarin. INR pending.  5. CAD: s/p CABG.  No CP - Continue Vytorin.  6. COPD: On home oxygen. No change.  7. OSA: Continue home CPAP. No change.  8. CKD: Stage 3.  Follow creatinine closely with diuresis, stable so far. BMET pending.  9. Constipation - Resolved.  10. Deconditioning - He will need HH PT/OT and 24 hour supervision   Length of Stay: [place], NP  10/23/2017, 7:29 AM  Advanced Heart Failure Team Pager [phone] (M-F; 7a - 4p)  Please contact [facility] for night-coverage after hours  (4p -7a ) and weekends on [email or web address]  Patient seen with NP, agree with the above note.    He diuresed well again and weight down. Creatinine 1.23.  He now has unna boots.    Echo was done today: showed EF 55-60%, mildly dilated/dysfunctional RV, severe MR likely mixed primary and secondary etiology with minimal mitral stenosis.   On exam, JVP 12 cm, 2+ edema to knees, irregular S1S2.   He needs ongoing diuresis, will volume overloaded.  Will give metolazone 2.5 x 1 and replace K/Mg.    Will need to have patient evaluated by structural heart team for possible Mitraclip.   Loralie Champagne 10/23/2017 1:40 PM

## 2017-10-23 NOTE — Anesthesia Preprocedure Evaluation (Signed)
Anesthesia Evaluation  Patient identified by MRN, date of birth, ID band Patient awake    Reviewed: Allergy & Precautions, NPO status , Patient's Chart, lab work & pertinent test results  Airway Mallampati: II  TM Distance: >3 FB Neck ROM: Full    Dental no notable dental hx.    Pulmonary asthma , sleep apnea , former smoker,    Pulmonary exam normal breath sounds clear to auscultation       Cardiovascular hypertension, + CAD, + CABG and +CHF  Normal cardiovascular exam Rhythm:Regular Rate:Normal     Neuro/Psych negative neurological ROS  negative psych ROS   GI/Hepatic negative GI ROS, Neg liver ROS,   Endo/Other  diabetes, Type 2  Renal/GU negative Renal ROS  negative genitourinary   Musculoskeletal negative musculoskeletal ROS (+)   Abdominal   Peds negative pediatric ROS (+)  Hematology  (+) Blood dyscrasia, anemia ,   Anesthesia Other Findings   Reproductive/Obstetrics negative OB ROS                             Anesthesia Physical Anesthesia Plan  ASA: III  Anesthesia Plan: MAC   Post-op Pain Management:    Induction:   PONV Risk Score and Plan: 1 and Treatment may vary due to age or medical condition  Airway Management Planned: Nasal Cannula  Additional Equipment:   Intra-op Plan:   Post-operative Plan:   Informed Consent: I have reviewed the patients History and Physical, chart, labs and discussed the procedure including the risks, benefits and alternatives for the proposed anesthesia with the patient or authorized representative who has indicated his/her understanding and acceptance.   Dental advisory given  Plan Discussed with:   Anesthesia Plan Comments:         Anesthesia Quick Evaluation

## 2017-10-23 NOTE — Progress Notes (Signed)
  Echocardiogram Echocardiogram Transesophageal has been performed.  [name] 10/23/2017, 1:46 PM

## 2017-10-23 NOTE — Consult Note (Addendum)
[place] VALVE TEAM  Inpatient TAVR Consultation:   Patient ID: [name]; [id number]; 07-05-1935   Admit date: 10/18/2017 Date of Consult: 10/23/2017  Primary Care Provider: Cassandria Anger, MD Primary Cardiologist: Dr. Tamala Julian / Dr. Radford Pax (CPAP)   Patient Profile:   [name] is a 82 y.o. male with a hx of CABG and mechanical AVR (1994), carotid artery disease s/p L CEA w/ subclavian bypass, DMT2, OSA on CPAP, chronic afib on coumadin, CKD stage III, pancytopenia (followed at cancer center), COPD, chronic diastolic CHF, rheumatic fever and severe MR who is being seen today for the evaluation of severe MR at the request of Dr. [name].  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 He has three kids. 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 He was also somewhat active around the house and helped with household chores such as vacuuming and dishes. However, over the past year he has noticed a steady decline in his ability to do these things secondary to worsening fatigue and shortness of breath. Of note, he has not seen a dentist in over 15 years and have some teeth that need work.   He has been followed in our office by Dr. Tamala Julian for general cardiology and Dr. Radford Pax follows his sleep apnea.  2D ECHO 02/2017 showed EF 55-60%, normal AVR, mod MR, severe LAE.   He was admitted 5/1-08/03/17 for AECOPD and A/C CHF. He was discharged on home 02. Discharge weight 250lbs.  He was seen in the office by Dr. Tamala Julian on 10/18/17 for progressive abdominal swelling, LE edema with anasarca and 50 lb weight gain since last admission in May (250--> 300lbs). His weight had continued to increase and symptoms worsened despite increased diuretics by PCP. He was admitted to [facility] for further work up and IV diuresis.   He has  been diuresing well on lasix gtt and metolazone. The advanced CHF service was consulted. Echo this admission showed EF 55-65%, mechanical aortic valve functioning normally, mild mitral stenosis/severe mitral regurgitation (mean gradient 4 mm hg), RV moderately dilated with moderately decreased systolic function, PASP 49 mmHg.  TEE today showed EF 55-60%, mild RV dilation with mod RV dysfunction, calcified mitral especially the posterior leaflet that was restricted, mean gradient 5 mmHg but MVA by PHT > 2 cm^2, minimal mitral stenosis and severe mitral regurgitation, likely due to restriction of posterior leaflet and annular dilation from severe LAE (mixed functional and primary etiology). ERO 0.42 cm^2 by PISA.  There was systolic flow reversal in the pulmonary vein doppler pattern. Dr. [name] suspected mixed primary and secondary etiology 2/2 severe LAE.   The structural heart team has been consulted to see if he would be a possible MitraClip candidate.   He is seen today laying in bed. He appears short of breath at rest. He says that his symptoms have gotten so bad that he cannot do any amount of exertion without significant dyspnea. He had to give up his church group position because he could not keep up. He has had significant abdominal distension, LE edema, orthopnea and PND. He has been feeling better since IV diuresis. No dizziness or syncope. No blood in stool or urine.    Past Medical History:  Diagnosis Date  . Aortic stenosis   . Carotid artery disease ([place]) 1994  s/p left carotid endarerectomy   . CHF with unknown LVEF ([facility])    47 %  . Chronic atrial fibrillation ([place])   . Hx of CABG   . Hypertension   . Left ventricular dysfunction 2007   LVEF 47%   . OSA (obstructive sleep apnea)   . Rheumatic fever   . Subclavian bypass stenosis ([place])   . Vitamin B12 deficiency     Past Surgical History:  Procedure Laterality Date  . [place]   replaced due to aortic  stenosis, [employer] mechanical prostesis  . CAROTID ENDARTERECTOMY Left 1997   subclavian bypass Done in [place]  . CORONARY ARTERY BYPASS GRAFT  1994   w SVG to RCA and SVG to circumflex  . heart bypass     Done in [place]     Inpatient Medications: Scheduled Meds: . [MAR Hold] carvedilol  12.5 mg Oral BID WC  . [MAR Hold] docusate sodium  100 mg Oral BID  . [MAR Hold] ezetimibe-simvastatin  1 tablet Oral QHS  . [MAR Hold] folic acid  1 mg Oral Daily  . [MAR Hold] insulin aspart  0-15 Units Subcutaneous TID WC  . metolazone  2.5 mg Oral Once  . potassium chloride  40 mEq Oral Once  . [MAR Hold] potassium chloride  60 mEq Oral BID  . [MAR Hold] sodium chloride flush  3 mL Intravenous Q12H  . [MAR Hold] spironolactone  25 mg Oral Daily  . [MAR Hold] vitamin B-12  3,000 mcg Oral Daily  . [MAR Hold] Warfarin - Pharmacist Dosing Inpatient   Does not apply q1800   Continuous Infusions: . [MAR Hold] sodium chloride    . sodium chloride 20 mL/hr at 10/22/17 1800  . sodium chloride    . furosemide (LASIX) infusion 15 mg/hr (10/22/17 1920)  . magnesium sulfate 1 - 4 g bolus IVPB     PRN Meds: [MAR Hold] sodium chloride, [MAR Hold] acetaminophen, [MAR Hold] fluocinonide cream, [MAR Hold] ipratropium-albuterol, [MAR Hold] ondansetron (ZOFRAN) IV, [MAR Hold] senna, [MAR Hold] sodium chloride flush  Allergies:    Allergies  Allergen Reactions  . Rifampin Rash    May have been caused by Vancomycin or Rifampin (??)  . Vancomycin Rash    May have been caused by Vancomycin or Rifampin (??)    Social History:   Social History   Socioeconomic History  . Marital status: Married    Spouse name: Not on file  . Number of children: Not on file  . Years of education: Not on file  . Highest education level: Not on file  Occupational History  . Not on file  Social Needs  . Financial resource strain: Not on file  . Food insecurity:    Worry: Not on file    Inability: Not on file  .  Transportation needs:    Medical: Not on file    Non-medical: Not on file  Tobacco Use  . Smoking status: Former Smoker    Packs/day: 1.00    Years: 30.00    Pack years: 30.00    Types: Cigarettes    Last attempt to quit: 04/03/1992    Years since quitting: 25.5  . Smokeless tobacco: Never Used  Substance and Sexual Activity  . Alcohol use: Yes    Comment: 4-6 ounces of wine daily  . Drug use: No    Comment: Half a cup a day.  Marland Kitchen Sexual activity: Not on file  Lifestyle  . Physical  activity:    Days per week: Not on file    Minutes per session: Not on file  . Stress: Not on file  Relationships  . Social connections:    Talks on phone: Not on file    Gets together: Not on file    Attends religious service: Not on file    Active member of club or organization: Not on file    Attends meetings of clubs or organizations: Not on file    Relationship status: Not on file  . Intimate partner violence:    Fear of current or ex partner: Not on file    Emotionally abused: Not on file    Physically abused: Not on file    Forced sexual activity: Not on file  Other Topics Concern  . Not on file  Social History Narrative  . Not on file    Family History:   The patient's family history includes Cancer in his father; Hypertension in his mother.  ROS:  Please see the history of present illness.  ROS  All other ROS reviewed and negative.     Physical Exam/Data:   Vitals:   10/23/17 1107 10/23/17 1335 10/23/17 1337 10/23/17 1340  BP: (!) 105/52 (!) 120/49  (!) 103/55  Pulse: 76 83  63  Resp: 19 (!) 25  (!) 23  Temp: 97.6 F (36.4 C)  98 F (36.7 C)   TempSrc: Oral Oral Oral   SpO2: 96% 100%  100%  Weight:      Height:        Intake/Output Summary (Last 24 hours) at 10/23/2017 1352 Last data filed at 10/23/2017 1328 Gross per 24 hour  Intake 1301.65 ml  Output 4750 ml  Net -3448.35 ml   Filed Weights   10/21/17 0450 10/22/17 0436 10/23/17 0513  Weight: 291 lb 3.2 oz  (132.1 kg) 289 lb 8 oz (131.3 kg) 280 lb 3.2 oz (127.1 kg)   Body mass index is 35.98 kg/m.  General:  Well nourished, well developed. Appears very mildly short of breath HEENT: normal Lymph: no adenopathy Neck: ++JVD Endocrine:  No thryomegaly Vascular: No carotid bruits; FA pulses 2+ bilaterally without bruits  Cardiac:  irreg irreg, +mechanical click @ RUSB and 2/6 holosystolic murmur at apex Lungs:  clear to auscultation bilaterally, no wheezing, rhonchi or rales  Abd: soft, nontender, no hepatomegaly  Ext: 2+ LE edema to thighs Musculoskeletal:  No deformities, BUE and BLE strength normal and equal Skin: warm and dry  Neuro:  CNs 2-12 intact, no focal abnormalities noted Psych:  Normal affect   EKG:  The EKG was personally reviewed and demonstrates:  afib with PVCs, HR 68 bpm  Telemetry:  Telemetry was personally reviewed and demonstrates:  Afib, PVCs  Relevant CV Studies: 2D ECHO 10/19/2017 Study Conclusions - Left ventricle: The cavity size was mildly dilated. There was   mild concentric hypertrophy. Systolic function was normal. The   estimated ejection fraction was in the range of 55% to 65%. Wall   motion was normal; there were no regional wall motion   abnormalities. - Ventricular septum: The contour showed diastolic flattening and   systolic flattening. - Aortic valve: There was trivial regurgitation. Mean gradient (S):   11 mm Hg. Peak gradient (S): 27 mm Hg. Valve area (VTI): 1.87   cm^2. Valve area (Vmax): 1.45 cm^2. Valve area (Vmean): 1.87   cm^2. - Mitral valve: The findings are consistent with mild stenosis.   There was severe regurgitation. Valve  area by continuity equation   (using LVOT flow): 1.85 cm^2. - Left atrium: The atrium was severely dilated. - Right ventricle: The cavity size was moderately dilated. Wall thickness was normal. Systolic function was moderately reduced. - Right atrium: The atrium was severely dilated. - Pulmonary arteries: Systolic  pressure was moderately increased. PA peak pressure: 49 mm Hg (S). - Inferior vena cava: The vessel was dilated. The respirophasic diameter changes were blunted (< 50%), consistent with elevated central venous pressure. - Pericardium, extracardiac: There was no pericardial effusion ------------------------------------------------------------------- Mitral valve:   Moderately thickened, moderately calcified leaflets. Mobility was not restricted.  Doppler:   The findings are consistent with mild stenosis. There was severe regurgitation. Valve area by pressure half-time: 2.62 cm^2. Indexed valve area by pressure half-time: 0.97 cm^2/m^2. Valve area by continuity equation (using LVOT flow): 1.85 cm^2. Indexed valve area by continuity equation (using LVOT flow): 0.68 cm^2/m^2. Mean gradient (D): 4 mm Hg. Peak gradient (D): 14 mm Hg.   ___________________   TEE 10/23/17 Procedure: TEE Indication: Mitral regurgitation  Findings: Please see echo section for full report.  Normal LV size with mild LV hypertrophy.  EF 55-60%, no regional wall motion abnormalities.  D-shaped septum consistent with RV pressure/volume overload.  The right ventricle was mildly dilated with mildly decreased systolic function.  Severe left atrial enlargement, no LA appendage thrombus.  Moderate right atrial enlargement.  No ASD/PFO by color doppler.  Mild tricuspid regurgitation with peak RV-RA gradient 25 mmHg.  Mechanical aortic valve with no significant regurgitation, unable to obtain mean gradient as transgastric windows were poor. The mitral valve was calcified, especially the posterior leaflet that was restricted.  Mean gradient 5 mmHg but MVA by PHT > 2 cm^2.  I think there was only minimal mitral stenosis.  There was severe mitral regurgitation, likely due to restriction of posterior leaflet and annular dilation from severe LAE (mixed functional and primary etiology).  ERO 0.42 cm^2 by PISA.  There was systolic flow reversal  in the pulmonary vein doppler pattern. Normal caliber ascending aorta.  Impression:  Severe mitral regurgitation with minimal mitral stenosis.  Suspect mixed primary and secondary etiology.    Laboratory Data:  Chemistry Recent Labs  Lab 10/21/17 0527 10/22/17 0539 10/23/17 0638  NA 139 143 142  K 3.1* 3.4* 3.8  CL 95* 95* 92*  CO2 35* 38* 40*  GLUCOSE 148* 112* 125*  BUN 30* 34* 34*  CREATININE 1.36* 1.37* 1.23  CALCIUM 9.1 9.3 9.5  GFRNONAA 48* 47* 54*  GFRAA 55* 55* >60  ANIONGAP 9 10 10     Recent Labs  Lab 10/18/17 1510  PROT 6.4*  ALBUMIN 3.2*  AST 29  ALT 15  ALKPHOS 146*  BILITOT 2.0*   Hematology Recent Labs  Lab 10/18/17 1510  WBC 3.6*  RBC 2.87*  HGB 9.8*  HCT 31.9*  MCV 111.1*  MCH 34.1*  MCHC 30.7  RDW 17.0*  PLT 75*   Cardiac EnzymesNo results for input(s): TROPONINI in the last 168 hours. No results for input(s): TROPIPOC in the last 168 hours.  BNP Recent Labs  Lab 10/18/17 1510  BNP 1,073.3*    DDimer No results for input(s): DDIMER in the last 168 hours.  Radiology/Studies:  No results found.   STS Risk Calculator:  Procedure: Isolated MVR   Risk of Mortality:  25.859%   Renal Failure:  25.326%   Permanent Stroke:  5.922%   Prolonged Ventilation:  48.675%   DSW Infection:  0.877%  Reoperation:  9.717%   Morbidity or Mortality:  57.880%   Short Length of Stay:  4.512%   Long Length of Stay:  40.170%   Procedure: MV Repair   Risk of Mortality:  20.198%   Renal Failure:  19.363%   Permanent Stroke:  4.768%   Prolonged Ventilation:  42.455%   DSW Infection:  0.459%   Reoperation:  9.564%   Morbidity or Mortality:  50.854%   Short Length of Stay:  6.228%   Long Length of Stay:  28.009%     Assessment and Plan:   [name] is a 82 y.o. male with symptoms of severe, stage D mitral regurgitation with NYHA Class IV symptoms. I have reviewed the patient's recent echocardiogram which is notable for normal LV  systolic function, mechanical aortic valve functioning normally, mild mitral stenosis/severe mitral regurgitation (mean gradient 4 mm hg), RV moderately dilated with moderately decreased systolic function, PASP 49 mmHg. TEE today showed EF 55-60%, mild RV dilation with mod RV dysfunction, calcified mitral especially the posterior leaflet that was restricted, mean gradient 5 mmHg but MVA by PHT > 2 cm^2, minimal mitral stenosis and severe mitral regurgitation, likely due to restriction of posterior leaflet and annular dilation from severe LAE (mixed functional and primary etiology). ERO 0.42 cm^2 by PISA.  There was systolic flow reversal in the pulmonary vein doppler pattern. Dr. [name] suspected mixed primary and secondary etiology 2/2 severe LAE.   He is currently admitted with A/C CHF with a 50 lb weight gain requiring a lasix gtt and metolazone for diuresis. He has become progressively more symptomatic over the past year and this is his second admission for CHF since May 2019.   I have reviewed the natural history of severe mitral regurgitation with the patient. We have discussed the limitations of medical therapy and the poor prognosis associated with symptomatic mitral regurigation. We have reviewed potential treatment options, including palliative medical therapy, conventional surgical mitral valve replacement/repair, and percutaneous therapies such as edge to edge mitral valve approximation with MitraClip. We discussed treatment options in the context of this patient's specific comorbid medical conditions.   The patient's predicted risk of mortality with conventional mitral valve replacement is 25.859% and mitral valve repair is 20.198% primarily based on age, previous bypass and mechanical AVR, chronic afib, chronic CHF with acute exacerbation, CKD, obesity, COPD, DMT2 and PVD. Other comorbidities include physical deconditioning. He would mostly likely not be a redo surgical candidate.   The  patient is interested in pursuing MitraClip surgery if it will help his symptoms. He was seen by palliative care and remains a full code and wants full scope of treatment. We will submit his echo images to industry to see if his valve is clippable. Dr. Burt Knack to follow.   Of note, if he is a Clip candidate, he will likely need a dental consult, which could be done as an outpatient.   Signed, Angelena Form, PA-C  10/23/2017 1:52 PM   Patient seen, examined. Available data reviewed. Agree with findings, assessment, and plan as outlined by Nell [place], PA-C.  The patient is independently interviewed and examined.  He is an alert, oriented, elderly male in no distress.  JVP is elevated, lung fields are clear except for diminished breath sounds at the bases, heart is irregular with a grade 3/6 holosystolic murmur at the apex, abdomen is soft, mildly distended, and nontender.  Extremities are wrapped with 2+ leg edema.  Echo and TEE images are reviewed.  The  patient appears to have normal function of his mechanical aortic valve and preserved LV systolic function.  There is moderate thickening of the mitral valve leaflets and mild calcification with severe mitral regurgitation associated with an ER OA of 0.4 and pulmonary vein flow reversal.  There is marked left atrial dilatation which I suspect is playing a significant role in his mitral regurgitation as well.  He appears to have mild mitral stenosis with a mean transvalvular gradient of 5 mmHg, but the pressure half-time of his mitral inflow velocity is short and not suggestive of significant mitral stenosis.  We discussed potential treatment options at length.  He has previously been very functional, but has declined significantly over the last 3 months, now on home O2 with clear evidence of decompensated heart failure on this admission.  He has at least New York Heart Association functional class IIIb symptoms.  Considering his previous cardiac surgery,  advanced age, and multiple comorbid medical conditions, it is highly unlikely that he would be a surgical candidate.  However, it might be reasonable to consider percutaneous edge to edge mitral valve repair with MitraClip.  We discussed the procedure at length today. We also reviewed further testing that might be required, such as right/left heart catheterization.  I am going to review his TEE images with our multidisciplinary heart valve team and further assess the anatomic suitability of his mitral valve for mitral clip.  I have some concern about his potential for mitral stenosis with percutaneous edge to edge mitral valve repair, but will review with other team members as well.  As outlined above, the patient will need an outpatient dental consultation and will also require formal cardiac surgical evaluation.  Fortunately, he appears to be clinically improving with IV diuresis.  We will plan to see him back in the office for follow-up to further review treatment of the patient's severe mitral regurgitation as long as he continues to improve.  Sherren Mocha, M.D. 10/23/2017 7:12 PM

## 2017-10-23 NOTE — Interval H&P Note (Signed)
History and Physical Interval Note:  10/23/2017 1:12 PM  [name]  has presented today for surgery, with the diagnosis of mitral regurgitation  The various methods of treatment have been discussed with the patient and family. After consideration of risks, benefits and other options for treatment, the patient has consented to  Procedure(s): TRANSESOPHAGEAL ECHOCARDIOGRAM (TEE) (N/A) as a surgical intervention .  The patient's history has been reviewed, patient examined, no change in status, stable for surgery.  I have reviewed the patient's chart and labs.  Questions were answered to the patient's satisfaction.     [name] [employer]

## 2017-10-24 ENCOUNTER — Inpatient Hospital Stay (HOSPITAL_COMMUNITY): Payer: Medicare Other

## 2017-10-24 ENCOUNTER — Inpatient Hospital Stay: Payer: Self-pay

## 2017-10-24 LAB — BASIC METABOLIC PANEL
ANION GAP: 12 (ref 5–15)
BUN: 33 mg/dL — ABNORMAL HIGH (ref 8–23)
CO2: 40 mmol/L — ABNORMAL HIGH (ref 22–32)
CREATININE: 1.18 mg/dL (ref 0.61–1.24)
Calcium: 9.5 mg/dL (ref 8.9–10.3)
Chloride: 89 mmol/L — ABNORMAL LOW (ref 98–111)
GFR calc non Af Amer: 56 mL/min — ABNORMAL LOW (ref >60)
Glucose, Bld: 113 mg/dL — ABNORMAL HIGH (ref 70–99)
Potassium: 4.1 mmol/L (ref 3.5–5.1)
Sodium: 141 mmol/L (ref 135–145)

## 2017-10-24 LAB — MAGNESIUM: MAGNESIUM: 2 mg/dL (ref 1.7–2.4)

## 2017-10-24 LAB — GLUCOSE, CAPILLARY
GLUCOSE-CAPILLARY: 122 mg/dL — AB (ref 70–99)
GLUCOSE-CAPILLARY: 115 mg/dL — AB (ref 70–99)
Glucose-Capillary: 129 mg/dL — ABNORMAL HIGH (ref 70–99)
Glucose-Capillary: 147 mg/dL — ABNORMAL HIGH (ref 70–99)

## 2017-10-24 LAB — CBC
HCT: 30.9 % — ABNORMAL LOW (ref 39.0–52.0)
HEMOGLOBIN: 9.4 g/dL — AB (ref 13.0–17.0)
MCH: 33.8 pg (ref 26.0–34.0)
MCHC: 30.4 g/dL (ref 30.0–36.0)
MCV: 111.2 fL — ABNORMAL HIGH (ref 78.0–100.0)
PLATELETS: 99 10*3/uL — AB (ref 150–400)
RBC: 2.78 MIL/uL — AB (ref 4.22–5.81)
RDW: 16.6 % — ABNORMAL HIGH (ref 11.5–15.5)
WBC: 5 10*3/uL (ref 4.0–10.5)

## 2017-10-24 LAB — PROTIME-INR
INR: 2.53
Prothrombin Time: 27.1 seconds — ABNORMAL HIGH (ref 11.4–15.2)

## 2017-10-24 MED ORDER — WARFARIN SODIUM 5 MG PO TABS
5.0000 mg | ORAL_TABLET | Freq: Once | ORAL | Status: AC
Start: 2017-10-24 — End: 2017-10-24
  Administered 2017-10-24: 5 mg via ORAL
  Filled 2017-10-24: qty 2

## 2017-10-24 MED ORDER — SODIUM CHLORIDE 0.9% FLUSH: 10.0000 mL | INTRAVENOUS | Status: DC | PRN

## 2017-10-24 MED ORDER — METOLAZONE 2.5 MG PO TABS
2.5000 mg | ORAL_TABLET | Freq: Once | ORAL | Status: AC
  Administered 2017-10-24: 2.5 mg via ORAL
  Filled 2017-10-24: qty 1

## 2017-10-24 MED ORDER — OXYMETAZOLINE HCL 0.05 % NA SOLN
2.0000 | Freq: Two times a day (BID) | NASAL | Status: DC | PRN
  Administered 2017-10-24: 2 via NASAL
  Filled 2017-10-24 (×2): qty 15

## 2017-10-24 NOTE — Progress Notes (Signed)
IV Team states they will arrive in 30 minutes.

## 2017-10-24 NOTE — Plan of Care (Signed)
  Problem: Education: Goal: Knowledge of General Education information will improve Description Including pain rating scale, medication(s)/side effects and non-pharmacologic comfort measures Outcome: Progressing   Problem: Clinical Measurements: Goal: Ability to maintain clinical measurements within normal limits will improve Outcome: Progressing   Problem: Skin Integrity: Goal: Risk for impaired skin integrity will decrease Outcome: Progressing   Problem: Education: Goal: Ability to demonstrate management of disease process will improve Outcome: Progressing

## 2017-10-24 NOTE — Progress Notes (Signed)
PT Cancellation Note  Patient Details Name: [name] MRN: [id number] DOB: 1936/03/15   Cancelled Treatment:    Reason Eval/Treat Not Completed: Medical issues which prohibited therapy. Nursing stopped PT at the door stating pt had begun to cough up blood clots and asked PT to see him another day.  Holding therapy for today for medical reasons.   Ramond Dial 10/24/2017, 12:04 PM   Mee Hives, PT MS Acute Rehab Dept. Number: [place] [facility] [place]

## 2017-10-24 NOTE — Progress Notes (Signed)
RN unable to restart lasix drip d/t no available site. RN informed night shift RN that lasix drip would need to be restarted.

## 2017-10-24 NOTE — Anesthesia Postprocedure Evaluation (Signed)
Anesthesia Post Note  Patient: [name]  Procedure(s) Performed: TRANSESOPHAGEAL ECHOCARDIOGRAM (TEE) (N/A )     Patient location during evaluation: Endoscopy Anesthesia Type: MAC Level of consciousness: awake and alert Pain management: pain level controlled Vital Signs Assessment: post-procedure vital signs reviewed and stable Respiratory status: spontaneous breathing, nonlabored ventilation, respiratory function stable and patient connected to nasal cannula oxygen Cardiovascular status: stable and blood pressure returned to baseline Postop Assessment: no apparent nausea or vomiting Anesthetic complications: no    Last Vitals:  Vitals:   10/23/17 2008 10/24/17 0301  BP:  115/87  Pulse: 70 80  Resp: 16 18  Temp:  36.4 C  SpO2: 95% 94%    Last Pain:  Vitals:   10/24/17 0800  TempSrc:   PainSc: 0-No pain                 Montez Hageman

## 2017-10-24 NOTE — Progress Notes (Signed)
Pt's nose began bleeding at 1000. RN gave pt a towel and ice to apply to nose. Pt's nose is still bleeding at this time. MD notified. No new orders.

## 2017-10-24 NOTE — Progress Notes (Signed)
Pt eating breakfast in bedside chair. RN changed tegaderm/gauze dressing on pt's right forearm. RN placed new tegaderm dressing on pt's right arm near elbow.

## 2017-10-24 NOTE — Care Management Important Message (Signed)
Important Message  Patient Details  Name: [name] MRN: [id number] Date of Birth: June 13, 1935   Medicare Important Message Given:  Yes    [name] 10/24/2017, 4:10 PM

## 2017-10-24 NOTE — Progress Notes (Signed)
Pt refusing CPAP for the night. RT will continue to monitor as needed.  

## 2017-10-24 NOTE — Progress Notes (Signed)
Lasix drip stopped at 1039. This is not showing on MAR. RN will restart lasix drip once PICC in place.

## 2017-10-24 NOTE — Progress Notes (Addendum)
Patient ID: [name], male   DOB: 25-Jun-1935, 82 y.o.   MRN: [id number]     Advanced Heart Failure Rounding Note  PCP-Cardiologist: Belva Crome III, MD   Subjective:    Brisk diuresis noted on lasix drip. Weight down another 6 pounds.   He developed a nose bleed and bleeding peripheral ivs and skin tears. IV removed with bleeding. Pressure applied.   Denies SOB.    TEE (7/23): showed EF 55-60%, mildly dilated/dysfunctional RV, severe MR likely mixed primary and secondary etiology with minimal mitral stenosis.  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   Objective:   Weight Range: 274 lb (124.3 kg) Body mass index is 35.18 kg/m.   Vital Signs:   Temp:  [97 F (36.1 C)-98.4 F (36.9 C)] 97.6 F (36.4 C) (07/24 0301) Pulse Rate:  [63-83] 80 (07/24 0301) Resp:  [16-25] 18 (07/24 0301) BP: (92-120)/(49-87) 115/87 (07/24 0301) SpO2:  [88 %-100 %] 94 % (07/24 0301) Weight:  [274 lb (124.3 kg)] 274 lb (124.3 kg) (07/24 0301) Last BM Date: 10/23/17  Weight change: Filed Weights   10/22/17 0436 10/23/17 0513 10/24/17 0301  Weight: 289 lb 8 oz (131.3 kg) 280 lb 3.2 oz (127.1 kg) 274 lb (124.3 kg)    Intake/Output:   Intake/Output Summary (Last 24 hours) at 10/24/2017 1029 Last data filed at 10/24/2017 0808 Gross per 24 hour  Intake 1773.16 ml  Output 5100 ml  Net -3326.84 ml      Physical Exam    General:  No resp difficulty. In bed.  HEENT: normal Neck: supple. JVP ~10 . Carotids 2+ bilat; no bruits. No lymphadenopathy or thryomegaly appreciated. Cor: PMI nondisplaced. Irregular. Mechanical S2, 2/6 SEM RUSB Lungs: clear on 2 liters Abdomen: obese, soft, nontender, nondistended. No hepatosplenomegaly. No bruits or masses. Good bowel sounds. Extremities: no cyanosis, clubbing, rash, R and LLE 3+ with compression wraps.  Neuro: alert & orientedx3, cranial nerves grossly intact. moves all 4 extremities w/o difficulty. Affect pleasant   Telemetry   Afib 70-80s  personally reviewed.   Labs    CBC Recent Labs    10/24/17 0418  WBC 5.0  HGB 9.4*  HCT 30.9*  MCV 111.2*  PLT 99*   Basic Metabolic Panel Recent Labs    10/23/17 0638 10/24/17 0418  NA 142 141  K 3.8 4.1  CL 92* 89*  CO2 40* 40*  GLUCOSE 125* 113*  BUN 34* 33*  CREATININE 1.23 1.18  CALCIUM 9.5 9.5  MG 1.8 2.0   Liver Function Tests No results for input(s): AST, ALT, ALKPHOS, BILITOT, PROT, ALBUMIN in the last 72 hours. No results for input(s): LIPASE, AMYLASE in the last 72 hours. Cardiac Enzymes No results for input(s): CKTOTAL, CKMB, CKMBINDEX, TROPONINI in the last 72 hours.  BNP: BNP (last 3 results) Recent Labs    08/01/17 1804 10/18/17 1510  BNP 784.4* 1,073.3*    ProBNP (last 3 results) Recent Labs    03/30/17 1147 05/25/17 1044 07/10/17 1523  PROBNP 1,858* 2,749* 411.0*     D-Dimer No results for input(s): DDIMER in the last 72 hours. Hemoglobin A1C No results for input(s): HGBA1C in the last 72 hours. Fasting Lipid Panel No results for input(s): CHOL, HDL, LDLCALC, TRIG, CHOLHDL, LDLDIRECT in the last 72 hours. Thyroid Function Tests No results for input(s): TSH, T4TOTAL, T3FREE, THYROIDAB in the last 72 hours.  Invalid input(s): FREET3  Other results:   Imaging    No results found.   Medications:  Scheduled Medications: . carvedilol  12.5 mg Oral BID WC  . docusate sodium  100 mg Oral BID  . ezetimibe-simvastatin  1 tablet Oral QHS  . folic acid  1 mg Oral Daily  . insulin aspart  0-15 Units Subcutaneous TID WC  . potassium chloride  60 mEq Oral BID  . sodium chloride flush  3 mL Intravenous Q12H  . spironolactone  25 mg Oral Daily  . vitamin B-12  3,000 mcg Oral Daily  . warfarin  5 mg Oral ONCE-1800  . Warfarin - Pharmacist Dosing Inpatient   Does not apply q1800    Infusions: . sodium chloride    . furosemide (LASIX) infusion 15 mg/hr (10/24/17 0747)    PRN Medications: sodium chloride, acetaminophen,  fluocinonide cream, ipratropium-albuterol, ondansetron (ZOFRAN) IV, senna, sodium chloride flush    Patient Profile   [name] is a 82 y.o. male with h/o mechanical AVR 1994, OSA on CPAP, Chronic afib, chronic combined CHF, CKD III, chronic coumadin therapy, CAD s/p CAGB 1994, and COPD.   Admitted from [facility] office 10/18/17 with Acute on chronic diastolic CHF.   Assessment/Plan   1. Acute on chronic diastolic CHF with prominent RV failure: Echo this admission showed EF 55-65%, mechanical aortic valve functioning normally, mild mitral stenosis/severe mitral regurgitation, RV moderately dilated with moderately decreased systolic function, PASP 49 mmHg.  Severe MR could be contributing to the significant RV failure.  Brisk diuresis noted. Overall weight down 25 pounds.  -Remains volume overload. Will need PICC and to restart lasix drip 15 mg per hour + metolazone.  -Continue spironolactone 25 mg daily.  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 He is being considered for Mitraclip and will be followed in the community.  3. Mechanical aortic valve: Appears to function well on echo this admission.  - Continue warfarin with INR goal 2.5-3.5  INR 2.5. With nose bleed => heparin gtt stopped. 4. Atrial fibrillation: Chronic.  - Continue Coreg 12.5 mg bid, HR is controlled.  - Continue warfarin. INR 2.5 with nose bleed. Heparin stopped this morningl  5. CAD: s/p CABG.  No CP - Continue Vytorin.  6. COPD: On home oxygen. No change.  7. OSA: Continue home CPAP. No change.  8. CKD: Stage 3.   Stable.   9. Constipation - Resolved.  10. Deconditioning - He will need HH PT/OT and 24 hour supervision  11. Epistaxis Heparin stopped earlier this morning. Try afrin to stop bleeding.    Place PICC Length of Stay: [place], NP  10/24/2017, 10:29 AM  Advanced  Heart Failure Team Pager [phone] (M-F; 7a - 4p)  Please contact [facility] for night-coverage after hours (4p -7a ) and weekends on [email or web address]  Patient seen with NP, agree with the above note.    He diuresed well again and weight down. Creatinine 1.18.  He now has unna boots.  Epistaxis and bleeding from IVs today.   On exam, JVP 12 cm, 2+ edema to knees, irregular S1S2 with mechanical S2.   Heparin gtt stopped.  He needs ongoing diuresis, still volume overloaded (based on prior weights, may need another 20-25 lbs off).  Will give metolazone 2.5 x 1 along with Lasix gtt and replace K/Mg.  He has lost IV access, will replace with PICC and will transfer to a step down unit to make monitoring of fluid  easier (CVPs).    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  I think he may benefit from Mitraclip.  Ongoing evaluation by structural heart team.   Loralie Champagne 10/24/2017 11:20 AM

## 2017-10-24 NOTE — Progress Notes (Signed)
Pt's nose bleed has stopped at this time.

## 2017-10-24 NOTE — Progress Notes (Signed)
ANTICOAGULATION CONSULT NOTE - Follow-up Consult  Pharmacy Consult for Coumadin Indication: mechanical aortic valve + Afib  Allergies  Allergen Reactions  . Rifampin Rash    May have been caused by Vancomycin or Rifampin (??)  . Vancomycin Rash    May have been caused by Vancomycin or Rifampin (??)    Patient Measurements: Height: 6\' 2"  (188 cm) Weight: (P) 274 lb (124.3 kg)(scale a) IBW/kg (Calculated) : 82.2 Heparin Dosing Weight: n/a   Vital Signs: Temp: 97.6 F (36.4 C) (07/24 0301) Temp Source: Oral (07/24 0301) BP: 115/87 (07/24 0301) Pulse Rate: 80 (07/24 0301)  Labs: Recent Labs    10/22/17 0539 10/23/17 8099 10/23/17 2225 10/24/17 0418  HGB  --   --   --  9.4*  HCT  --   --   --  30.9*  PLT  --   --   --  99*  LABPROT 22.6* 22.0*  --  27.1*  INR 2.01 1.94  --  2.53  HEPARINUNFRC  --   --  0.46  --   CREATININE 1.37* 1.23  --  1.18    Estimated Creatinine Clearance: 70.8 mL/min (by C-G formula based on SCr of 1.18 mg/dL).   Medical History: Past Medical History:  Diagnosis Date  . Carotid artery disease ([place]) 1994   s/p left carotid endarerectomy   . Chronic atrial fibrillation (HCC)    a. on coumadin   . Chronic diastolic CHF (congestive heart failure) ([place])   . Hx of CABG    a. 1994  . Hypertension   . OSA (obstructive sleep apnea)   . Rheumatic fever   . S/P AVR (aortic valve replacement)    a. mechanical valve 1996  . Subclavian bypass stenosis ([place])   . Vitamin B12 deficiency    Assessment: 82 yo male on chronic Coumadin for mechanical aortic valve + afib. PTA Coumadin dose 5 mg daily except 2.5 mg on Mon, Fri.  Today's INR now at goal at 2.5, boosted dose given last night and saw a significant rise in INR. Heparin started overnight but with INR up this am it has been stopped.   Goal of Therapy:  INR 2.5-3.5 Monitor platelets by anticoagulation protocol: Yes   Plan:  1. Warfarin 5 mg x 1 tonight 2. Daily PT/INR. 3. Heparin no  longer needed  Erin Hearing PharmD., BCPS Clinical Pharmacist 10/24/2017 7:59 AM

## 2017-10-24 NOTE — Plan of Care (Signed)
  Problem: Skin Integrity: Goal: Risk for impaired skin integrity will decrease Outcome: Progressing  Pt. Stated bruising from blood thinner.  Pt. Repositioned in bed.

## 2017-10-24 NOTE — Progress Notes (Signed)
Pt bleeding out of nose, both IV sites on left arm/hand, and skin tear on right arm. Bleeding on arms is controlled, but pt is still bleeding from nose. MD aware. No new orders.

## 2017-10-24 NOTE — Progress Notes (Signed)
Peripherally Inserted Central Catheter/Midline Placement  The IV Nurse has discussed with the patient and/or persons authorized to consent for the patient, the purpose of this procedure and the potential benefits and risks involved with this procedure.  The benefits include less needle sticks, lab draws from the catheter, and the patient may be discharged home with the catheter. Risks include, but not limited to, infection, bleeding, blood clot (thrombus formation), and puncture of an artery; nerve damage and irregular heartbeat and possibility to perform a PICC exchange if needed/ordered by physician.  Alternatives to this procedure were also discussed.  Bard Power PICC patient education guide, fact sheet on infection prevention and patient information card has been provided to patient /or left at bedside.    PICC/Midline Placement Documentation  PICC Double Lumen 10/24/17 PICC Right Brachial 45 cm 0 cm (Active)  Site Assessment Clean;Dry;Intact 10/24/2017  7:36 PM  Lumen #1 Status Flushed;Saline locked;Blood return noted 10/24/2017  7:36 PM  Lumen #2 Status Flushed;Saline locked;Blood return noted 10/24/2017  7:36 PM  Dressing Type Transparent;Securing device 10/24/2017  7:36 PM  Dressing Status Clean;Dry;Intact;Antimicrobial disc in place 10/24/2017  7:36 PM  Dressing Change Due 10/31/17 10/24/2017  7:36 PM       Frances Maywood 10/24/2017, 7:38 PM

## 2017-10-24 NOTE — Progress Notes (Signed)
RN rounded on pt. Pt states he has not had any further bleeding.

## 2017-10-24 NOTE — Progress Notes (Signed)
Pt states he does not want his Zaroxolyn at this time. RN informed pt she will return with medication in 30 minutes. Pt states this is okay.

## 2017-10-24 NOTE — Telephone Encounter (Signed)
Patients sleep studies were faxed over to [facility] at fax# [phone] so patient can get his supplies.

## 2017-10-24 NOTE — Progress Notes (Signed)
Occupational Therapy Treatment Patient Details Name: [name] MRN: [id number] DOB: [birth year] Today's Date: 10/24/2017    History of present illness 82 y.o. male who presents for with mechanical aortic valve 1994, obstructive sleep apnea on chronic CPAP, chronic atrial fibrillation, chronic combined systolic and diastolic heart failure, chronic stage III CKD, Coumadin therapy, prior CABG 1994, and recently diagnosed with COPD and placed on chronic oxygen therapy by hospitalist during an early June 2019 admission Progressive abdominal swelling, lower extremity swelling with anasarca over the past 6 weeks   OT comments  Upon arrival, pt awake and supine in bed. Pt agreeable and motivated to participate in therapy. Providing education on AE for LB ADLs. Pt demonstrating and verbalized understand and required Min A for LB dressing with AE. Pt demonstrating decreased activity tolerance as seen by SOB and decreased SpO2 during bed mobility and functional mobility.  Pt's SpO2 dropping to 77% on 3.5 L O2 and requiring significant amount of time and purse lip breathing to reach low 90s; Rn notified. Continue to recommend post-acute OT and will continue to follow acutely.    Follow Up Recommendations  Home health OT;Supervision - Intermittent    Equipment Recommendations  Other (comment)(long handled sponge)    Recommendations for Other Services      Precautions / Restrictions Precautions Precautions: Fall Precaution Comments: 2 months ago tripped on throw rug, all throw rugs gone from home Restrictions Weight Bearing Restrictions: No       Mobility Bed Mobility Overal bed mobility: Needs Assistance Bed Mobility: Supine to Sit;Sit to Supine     Supine to sit: Min assist Sit to supine: Mod assist   General bed mobility comments: Min A for facilitating transition of hips towards EOB with bedpad. Pt requiring Mod A for assisting BLEs over EOB.  Transfers Overall transfer level: Needs  assistance Equipment used: 4-wheeled walker Transfers: Sit to/from Stand Sit to Stand: Min guard;From elevated surface(with rollator)         General transfer comment: Min Guard A for safety    Balance Overall balance assessment: Needs assistance Sitting-balance support: Feet supported;No upper extremity supported Sitting balance-Leahy Scale: Fair     Standing balance support: During functional activity;No upper extremity supported Standing balance-Leahy Scale: Fair Standing balance comment: more stable with RW support                           ADL either performed or assessed with clinical judgement   ADL Overall ADL's : Needs assistance/impaired               Lower Body Bathing Details (indicate cue type and reason): Educating pt on use of long handled sponge for LB bathing     Lower Body Dressing: Minimal assistance;Sit to/from stand;With adaptive equipment Lower Body Dressing Details (indicate cue type and reason): Providing education and handout on AE for LB bathing. Pt requiring Min A for donning/doffing socks due to ace wraping inhibiting movement of socks. Pt demonstrating understanding overall.  Toilet Transfer: (rollator)           Functional mobility during ADLs: Min guard;Rolling walker General ADL Comments: Pt performing LB dressing at EOB and demosntrating understanding of education on AE. Pt performing functional mobility and demonstrating decreased activity tolerance as seen by SOB and decreased SpO2 to 77% on 3.5 L O2. Pt requiring increased time and purse lip breathing to reach 88-90%. Notified RN of pt SpO2 rate.  Vision       Perception     Praxis      Cognition Arousal/Alertness: Awake/alert Behavior During Therapy: WFL for tasks assessed/performed Overall Cognitive Status: Within Functional Limits for tasks assessed                                          Exercises     Shoulder Instructions        General Comments SpO2 dropping to 77% on 3.5 L O2 during functional mobility and bed mobility. pt requiring significant amount of time and purse lip breathing to bring SpO2 to 88-90%. RN notified.     Pertinent Vitals/ Pain       Pain Assessment: No/denies pain  Home Living                                          Prior Functioning/Environment              Frequency  Min 2X/week        Progress Toward Goals  OT Goals(current goals can now be found in the care plan section)  Progress towards OT goals: Progressing toward goals(Demonstrating decreased activity tolerance)  Acute Rehab OT Goals Patient Stated Goal: plan is to go home OT Goal Formulation: With patient Time For Goal Achievement: 10/27/17 Potential to Achieve Goals: Good ADL Goals Pt Will Perform Lower Body Dressing: with min guard assist;with adaptive equipment;sit to/from stand Pt Will Transfer to Toilet: with supervision;ambulating Pt Will Perform Toileting - Clothing Manipulation and hygiene: with supervision;sit to/from stand Additional ADL Goal #1: Pt will independently state 3 energy conservation techniques and utilize in session as needed.  Plan Discharge plan remains appropriate    Co-evaluation                 AM-PAC PT "6 Clicks" Daily Activity     Outcome Measure   Help from another person eating meals?: None Help from another person taking care of personal grooming?: A Little Help from another person toileting, which includes using toliet, bedpan, or urinal?: A Little Help from another person bathing (including washing, rinsing, drying)?: A Lot Help from another person to put on and taking off regular upper body clothing?: A Little Help from another person to put on and taking off regular lower body clothing?: A Lot 6 Click Score: 17    End of Session Equipment Utilized During Treatment: Gait belt;Oxygen;Other (comment)(rollator)  OT Visit Diagnosis: Other  (comment);Muscle weakness (generalized) (M62.81)(decreased activity tolerance)   Activity Tolerance Patient tolerated treatment well   Patient Left with call bell/phone within reach;in bed   Nurse Communication Mobility status;Other (comment)(SpO2)        Time: 8309-4076 OT Time Calculation (min): 38 min  Charges: OT General Charges $OT Visit: 1 Visit OT Treatments $Self Care/Home Management : 38-52 mins  [place], OTR/L Acute Rehab Pager: [phone] Office: [place] 10/24/2017, 4:38 PM

## 2017-10-25 ENCOUNTER — Encounter (HOSPITAL_COMMUNITY): Payer: Self-pay | Admitting: Cardiology

## 2017-10-25 LAB — PROTIME-INR
INR: 2.85
Prothrombin Time: 29.7 seconds — ABNORMAL HIGH (ref 11.4–15.2)

## 2017-10-25 LAB — MAGNESIUM: Magnesium: 1.9 mg/dL (ref 1.7–2.4)

## 2017-10-25 LAB — BASIC METABOLIC PANEL
Anion gap: 13 (ref 5–15)
BUN: 39 mg/dL — AB (ref 8–23)
CHLORIDE: 88 mmol/L — AB (ref 98–111)
CO2: 40 mmol/L — AB (ref 22–32)
Calcium: 9.3 mg/dL (ref 8.9–10.3)
Creatinine, Ser: 1.11 mg/dL (ref 0.61–1.24)
GFR calc Af Amer: >60 mL/min (ref >60)
GFR calc non Af Amer: >60 mL/min (ref >60)
GLUCOSE: 124 mg/dL — AB (ref 70–99)
Potassium: 4 mmol/L (ref 3.5–5.1)
Sodium: 141 mmol/L (ref 135–145)

## 2017-10-25 LAB — GLUCOSE, CAPILLARY
GLUCOSE-CAPILLARY: 124 mg/dL — AB (ref 70–99)
GLUCOSE-CAPILLARY: 149 mg/dL — AB (ref 70–99)
GLUCOSE-CAPILLARY: 128 mg/dL — AB (ref 70–99)
Glucose-Capillary: 130 mg/dL — ABNORMAL HIGH (ref 70–99)

## 2017-10-25 LAB — T4, FREE: Free T4: 1.05 ng/dL (ref 0.82–1.77)

## 2017-10-25 MED ORDER — METOLAZONE 2.5 MG PO TABS
2.5000 mg | ORAL_TABLET | Freq: Once | ORAL | Status: AC
  Administered 2017-10-25: 2.5 mg via ORAL
  Filled 2017-10-25: qty 1

## 2017-10-25 MED ORDER — MAGNESIUM SULFATE 2 GM/50ML IV SOLN
2.0000 g | Freq: Once | INTRAVENOUS | Status: AC
  Administered 2017-10-25: 2 g via INTRAVENOUS
  Filled 2017-10-25: qty 50

## 2017-10-25 MED ORDER — WARFARIN SODIUM 5 MG PO TABS
5.0000 mg | ORAL_TABLET | Freq: Once | ORAL | Status: AC
  Administered 2017-10-25: 5 mg via ORAL
  Filled 2017-10-25: qty 1

## 2017-10-25 NOTE — Consult Note (Signed)
   [facility] CM Inpatient Consult   10/25/2017  [name] 15-Jun-1935 [id number]  Patient screened for potential needs in the [place] Management. atient in the Medicare ACO.  Met with the patient at the bedside and he endorses Dr. Lew Dawes as his primary care provider.  Patient given a brochure and 24 hour nurse advise line magnet as he wanted to review the information.  Patient with [facility] for transition of care home.  His primary care provider does the transition of care calls and follow up. Encouraged patient to contact [place]. Patient felt his need would be met by Home Health. Noted also that the patient has been assigned EMMI HF calls.    Please place a [facility] Care Management consult or for questions contact:   Natividad Brood, RN BSN [facility] Liaison  [phone] business mobile phone Toll free office [phone]

## 2017-10-25 NOTE — Progress Notes (Signed)
In-line blood sampling tubing connected to PICC.  Leveled to phlebostatic axis and zeroed.  CVP = 15.

## 2017-10-25 NOTE — Progress Notes (Signed)
ANTICOAGULATION CONSULT NOTE - Follow-up Consult  Pharmacy Consult for Coumadin Indication: mechanical aortic valve + Afib  Allergies  Allergen Reactions  . Rifampin Rash    May have been caused by Vancomycin or Rifampin (??)  . Vancomycin Rash    May have been caused by Vancomycin or Rifampin (??)    Patient Measurements: Height: 6\' 2"  (188 cm) Weight: 271 lb 4.8 oz (123.1 kg) IBW/kg (Calculated) : 82.2 Heparin Dosing Weight: n/a   Vital Signs: Temp: 97.6 F (36.4 C) (07/25 0610) Temp Source: Oral (07/25 0610) BP: 90/52 (07/25 0610) Pulse Rate: 70 (07/25 0610)  Labs: Recent Labs    10/23/17 7989 10/23/17 2225 10/24/17 0418 10/25/17 0358  HGB  --   --  9.4*  --   HCT  --   --  30.9*  --   PLT  --   --  99*  --   LABPROT 22.0*  --  27.1* 29.7*  INR 1.94  --  2.53 2.85  HEPARINUNFRC  --  0.46  --   --   CREATININE 1.23  --  1.18 1.11    Estimated Creatinine Clearance: 74 mL/min (by C-G formula based on SCr of 1.11 mg/dL).   Medical History: Past Medical History:  Diagnosis Date  . Carotid artery disease ([place]) 1994   s/p left carotid endarerectomy   . Chronic atrial fibrillation (HCC)    a. on coumadin   . Chronic diastolic CHF (congestive heart failure) ([place])   . Hx of CABG    a. 1994  . Hypertension   . OSA (obstructive sleep apnea)   . Rheumatic fever   . S/P AVR (aortic valve replacement)    a. mechanical valve 1996  . Subclavian bypass stenosis ([place])   . Vitamin B12 deficiency    Assessment: 82 yo male on chronic Coumadin for mechanical aortic valve + afib. PTA Coumadin dose 5 mg daily except 2.5 mg on Mon, Fri.  Today's INR at goal at 2.8. Nose bleed yesterday after receiving heparin the previous night. Now resolved. Will continue with home dose of warfarin for now.   Goal of Therapy:  INR 2.5-3.5 Monitor platelets by anticoagulation protocol: Yes   Plan:  1. Warfarin 5 mg x 1 tonight 2. Daily PT/INR, check cbc in am  Erin Hearing  PharmD., BCPS Clinical Pharmacist 10/25/2017 10:57 AM

## 2017-10-25 NOTE — Progress Notes (Signed)
Physical Therapy Treatment Patient Details Name: [name] MRN: [id number] DOB: Jun 25, 1935 Today's Date: 10/25/2017    History of Present Illness 82 y.o. male who presents for with mechanical aortic valve 1994, obstructive sleep apnea on chronic CPAP, chronic atrial fibrillation, chronic combined systolic and diastolic heart failure, chronic stage III CKD, Coumadin therapy, prior CABG 1994, and recently diagnosed with COPD and placed on chronic oxygen therapy by hospitalist during an early June 2019 admission Progressive abdominal swelling, lower extremity swelling with anasarca over the past 6 weeks    PT Comments    Patient progressing slowly with therapy this evening, with less assistance needed for transfers, and ambulating 120' without rest break and supervision. Pt strongly prefers rollator to RW. Pt de-sats on 3L with activity, but remains in 90's with rest. Cues for pursed lip breathing with slow return to 90's on 4L at end of session. Pt still wishes to return to HHPT once ready. HR range 75-99 with A fib, RN notified.      Follow Up Recommendations  Home health PT;Supervision/Assistance - 24 hour     Equipment Recommendations  None recommended by PT    Recommendations for Other Services       Precautions / Restrictions Precautions Precautions: Fall Restrictions Weight Bearing Restrictions: No    Mobility  Bed Mobility Overal bed mobility: Needs Assistance Bed Mobility: Supine to Sit;Sit to Supine     Supine to sit: Min guard Sit to supine: Min guard      Transfers Overall transfer level: Needs assistance Equipment used: Rolling walker (2 wheeled) Transfers: Sit to/from Stand Sit to Stand: Min guard Stand pivot transfers: Min guard       General transfer comment: Min Guard A for safety  Ambulation/Gait Ambulation/Gait assistance: Min guard Gait Distance (Feet): 120 Feet Assistive device: Rolling walker (2 wheeled) Gait Pattern/deviations:  Step-through pattern;Decreased stride length;Wide base of support Gait velocity: decr   General Gait Details: pt de sats on 3L tonight becoming slightly dizzy by end of session. pt strongly prefers rollator. no overt LOB   Stairs             Wheelchair Mobility    Modified Rankin (Stroke Patients Only)       Balance Overall balance assessment: Needs assistance Sitting-balance support: Feet supported;No upper extremity supported Sitting balance-Leahy Scale: Fair     Standing balance support: During functional activity;No upper extremity supported Standing balance-Leahy Scale: Fair Standing balance comment: more stable with RW support                            Cognition Arousal/Alertness: Awake/alert Behavior During Therapy: WFL for tasks assessed/performed Overall Cognitive Status: Within Functional Limits for tasks assessed                                        Exercises      General Comments        Pertinent Vitals/Pain Pain Assessment: No/denies pain    Home Living                      Prior Function            PT Goals (current goals can now be found in the care plan section) Acute Rehab PT Goals Patient Stated Goal: plan is to go home PT Goal Formulation: With  patient Time For Goal Achievement: 11/02/17 Potential to Achieve Goals: Fair Progress towards PT goals: Progressing toward goals    Frequency    Min 3X/week      PT Plan Current plan remains appropriate    Co-evaluation              AM-PAC PT "6 Clicks" Daily Activity  Outcome Measure  Difficulty turning over in bed (including adjusting bedclothes, sheets and blankets)?: A Little Difficulty moving from lying on back to sitting on the side of the bed? : A Lot Difficulty sitting down on and standing up from a chair with arms (e.g., wheelchair, bedside commode, etc,.)?: A Lot Help needed moving to and from a bed to chair (including a  wheelchair)?: A Little Help needed walking in hospital room?: A Little Help needed climbing 3-5 steps with a railing? : A Lot 6 Click Score: 15    End of Session Equipment Utilized During Treatment: Gait belt;Oxygen Activity Tolerance: Patient tolerated treatment well Patient left: with call bell/phone within reach;with family/visitor present;with chair alarm set;in chair Nurse Communication: Mobility status PT Visit Diagnosis: Unsteadiness on feet (R26.81);Other abnormalities of gait and mobility (R26.89);Muscle weakness (generalized) (M62.81);History of falling (Z91.81)     Time: 3419-3790 PT Time Calculation (min) (ACUTE ONLY): 30 min  Charges:  $Gait Training: 8-22 mins $Therapeutic Activity: 8-22 mins                     Reinaldo Berber, PT, DPT Acute Rehab Services Pager: [phone]    Reinaldo Berber 10/25/2017, 5:17 PM

## 2017-10-25 NOTE — Progress Notes (Addendum)
Patient ID: [name], male   DOB: 05-13-1935, 82 y.o.   MRN: [id number]     Advanced Heart Failure Rounding Note  PCP-Cardiologist: [name], MD   Subjective:    PICC now in place. Brisk diuresis with -3.5 L and down another 3 lbs. Creatinine stable 1.11  Denies SOB, dizziness, or further nosebleeds.  Was hypoxic to 77% on 3.5 L when walking with OT yesterday  TEE (7/23): showed EF 55-60%, mildly dilated/dysfunctional RV, severe MR likely mixed primary and secondary etiology with minimal mitral stenosis.  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   Objective:   Weight Range: 271 lb 4.8 oz (123.1 kg) Body mass index is 34.83 kg/m.   Vital Signs:   Temp:  [97.6 F (36.4 C)-97.8 F (36.6 C)] 97.6 F (36.4 C) (07/25 0610) Pulse Rate:  [70-92] 70 (07/25 0610) Resp:  [18] 18 (07/25 0610) BP: (90-112)/(52-63) 90/52 (07/25 0610) SpO2:  [85 %-95 %] 95 % (07/25 0610) Weight:  [271 lb 4.8 oz (123.1 kg)] 271 lb 4.8 oz (123.1 kg) (07/25 0128) Last BM Date: 10/23/17  Weight change: Filed Weights   10/23/17 0513 10/24/17 0301 10/25/17 0128  Weight: 280 lb 3.2 oz (127.1 kg) 274 lb (124.3 kg) 271 lb 4.8 oz (123.1 kg)    Intake/Output:   Intake/Output Summary (Last 24 hours) at 10/25/2017 0726 Last data filed at 10/25/2017 0601 Gross per 24 hour  Intake 1897.22 ml  Output 5400 ml  Net -3502.78 ml      Physical Exam    General: No resp difficulty. HEENT: Normal Neck: Supple. JVP ~10. Carotids 2+ bilat; no bruits. No thyromegaly or nodule noted. Cor: PMI nondisplaced. IRR, mechanical S2, 2/6 SEM RUSB Lungs: clear, diminished throughout Abdomen: Soft, non-tender, non-distended, no HSM. No bruits or masses. +BS  Extremities: No cyanosis, clubbing, or rash. R and LLE 2-3+ edema. Unna boots in place. RUE DL PICC Neuro: Alert & orientedx3, cranial nerves grossly intact. moves all 4 extremities w/o difficulty. Affect pleasant   Telemetry   Afib 70-80s with occasional  PVCs. personally reviewed.   Labs    CBC Recent Labs    10/24/17 0418  WBC 5.0  HGB 9.4*  HCT 30.9*  MCV 111.2*  PLT 99*   Basic Metabolic Panel Recent Labs    10/24/17 0418 10/25/17 0358  NA 141 141  K 4.1 4.0  CL 89* 88*  CO2 40* 40*  GLUCOSE 113* 124*  BUN 33* 39*  CREATININE 1.18 1.11  CALCIUM 9.5 9.3  MG 2.0 1.9   Liver Function Tests No results for input(s): AST, ALT, ALKPHOS, BILITOT, PROT, ALBUMIN in the last 72 hours. No results for input(s): LIPASE, AMYLASE in the last 72 hours. Cardiac Enzymes No results for input(s): CKTOTAL, CKMB, CKMBINDEX, TROPONINI in the last 72 hours.  BNP: BNP (last 3 results) Recent Labs    08/01/17 1804 10/18/17 1510  BNP 784.4* 1,073.3*    ProBNP (last 3 results) Recent Labs    03/30/17 1147 05/25/17 1044 07/10/17 1523  PROBNP 1,858* 2,749* 411.0*     D-Dimer No results for input(s): DDIMER in the last 72 hours. Hemoglobin A1C No results for input(s): HGBA1C in the last 72 hours. Fasting Lipid Panel No results for input(s): CHOL, HDL, LDLCALC, TRIG, CHOLHDL, LDLDIRECT in the last 72 hours. Thyroid Function Tests No results for input(s): TSH, T4TOTAL, T3FREE, THYROIDAB in the last 72 hours.  Invalid input(s): FREET3  Other results:   Imaging    Dg Chest  Port 1 View  Result Date: 10/24/2017 CLINICAL DATA:  PICC line EXAM: PORTABLE CHEST 1 VIEW COMPARISON:  10/18/2017, 08/01/2017 FINDINGS: Post sternotomy changes. Right upper extremity catheter tip projects over the SVC. Cardiomegaly with vascular congestion. Small left pleural effusion with left basilar airspace disease. Aortic atherosclerosis. IMPRESSION: 1. Right upper extremity catheter tip overlies the SVC. 2. Cardiomegaly with small left pleural effusion and left basilar airspace disease. Electronically Signed   By: Donavan Foil M.D.   On: 10/24/2017 19:54   [place] Ekg Site Rite  Result Date: 10/24/2017 If Site Rite image not attached, placement could  not be confirmed due to current cardiac rhythm.    Medications:     Scheduled Medications: . carvedilol  12.5 mg Oral BID WC  . docusate sodium  100 mg Oral BID  . ezetimibe-simvastatin  1 tablet Oral QHS  . folic acid  1 mg Oral Daily  . insulin aspart  0-15 Units Subcutaneous TID WC  . potassium chloride  60 mEq Oral BID  . sodium chloride flush  3 mL Intravenous Q12H  . spironolactone  25 mg Oral Daily  . vitamin B-12  3,000 mcg Oral Daily  . Warfarin - Pharmacist Dosing Inpatient   Does not apply q1800    Infusions: . sodium chloride    . furosemide (LASIX) infusion 15 mg/hr (10/24/17 2057)    PRN Medications: sodium chloride, acetaminophen, fluocinonide cream, ipratropium-albuterol, ondansetron (ZOFRAN) IV, oxymetazoline, senna, sodium chloride flush, sodium chloride flush    Patient Profile   [name] is a 82 y.o. male with h/o mechanical AVR 1994, OSA on CPAP, Chronic afib, chronic combined CHF, CKD III, chronic coumadin therapy, CAD s/p CAGB 1994, and COPD.   Admitted from [facility] office 10/18/17 with Acute on chronic diastolic CHF.   Assessment/Plan   1. Acute on chronic diastolic CHF with prominent RV failure: Echo this admission showed EF 55-65%, mechanical aortic valve functioning normally, mild mitral stenosis/severe mitral regurgitation, RV moderately dilated with moderately decreased systolic function, PASP 49 mmHg.  Severe MR could be contributing to the significant RV failure.  Brisk diuresis noted. Overall weight down 28 pounds.  - Remains volume overload. Continue lasix drip 15 mg per hour + metolazone again today.   - Continue spironolactone 25 mg daily.  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 He is being considered for Mitraclip and will be followed in the community.  3. Mechanical aortic valve: Appears to function well  on echo this admission.  - Continue warfarin with INR goal 2.5-3.5. - INR 2.85 With nose bleed => heparin gtt stopped. 4. Atrial fibrillation: Chronic.  - Continue Coreg 12.5 mg bid, HR is controlled.  - Continue warfarin. INR 2.5 with nose bleed. Heparin stopped yesterday 5. CAD: s/p CABG.  No CP - Continue Vytorin.  6. COPD: On home oxygen. No change.   7. OSA: Continue home CPAP. No change.   8. CKD: Stage 3.   - Stable 9. Constipation - Resolved 10. Deconditioning - He will need HH PT/OT and 24 hour supervision. No change.  11. Epistaxis - Heparin stopped yesterday.  - Nosebleed resolved.    Length of Stay: [place], NP  10/25/2017, 7:26 AM  Advanced Heart Failure Team Pager [phone] (M-F; 7a - 4p)  Please contact [facility] for night-coverage after hours (4p -7a ) and weekends on  [email or web address]  Patient seen with NP, agree with the above note.  He continues to diurese well, down another 3 lbs and creatinine stable.  Probably still has another 20 lbs of volume to get off.  CVP 15 today.  - Continue Lasix gtt and will give metolazone again today.   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  Mitral stenosis is minimal.  We sent off his TEE for Mitraclip evaluation, but it appears his MVA may be too small for successful clip (< 3.0 cm^2).  If he cannot get Mitraclip, options will be ongoing medical management versus MV surgery.  Surgery will be a difficult proposition given deconditioning and age.  I will have Dr. [name] see him to discuss surgical risk/outcomes if we were to undertake minimally invasive MV repair.   Loralie Champagne 10/25/2017 2:03 PM

## 2017-10-25 NOTE — Progress Notes (Signed)
BP noted. Pt. denies s/s of hypotension. BP  Rechecked and 84/59 received. Smaller cuff used, since pts. Edema in BUE decreased. Pt. States his BP is naturally low and BP in his left arm will always read lower. PICC line in right arm. Unable to measure BP in BLE due to edema.

## 2017-10-26 ENCOUNTER — Inpatient Hospital Stay (HOSPITAL_COMMUNITY): Payer: Medicare Other

## 2017-10-26 DIAGNOSIS — N183 Chronic kidney disease, stage 3 (moderate): Secondary | ICD-10-CM

## 2017-10-26 DIAGNOSIS — I340 Nonrheumatic mitral (valve) insufficiency: Secondary | ICD-10-CM

## 2017-10-26 DIAGNOSIS — I4891 Unspecified atrial fibrillation: Secondary | ICD-10-CM

## 2017-10-26 LAB — PROTIME-INR
INR: 3.05
PROTHROMBIN TIME: 31.3 s — AB (ref 11.4–15.2)

## 2017-10-26 LAB — GLUCOSE, CAPILLARY
GLUCOSE-CAPILLARY: 355 mg/dL — AB (ref 70–99)
Glucose-Capillary: 126 mg/dL — ABNORMAL HIGH (ref 70–99)
Glucose-Capillary: 104 mg/dL — ABNORMAL HIGH (ref 70–99)
Glucose-Capillary: 130 mg/dL — ABNORMAL HIGH (ref 70–99)

## 2017-10-26 LAB — BASIC METABOLIC PANEL
ANION GAP: 14 (ref 5–15)
BUN: 42 mg/dL — ABNORMAL HIGH (ref 8–23)
CHLORIDE: 82 mmol/L — AB (ref 98–111)
CO2: 41 mmol/L — ABNORMAL HIGH (ref 22–32)
Calcium: 9.2 mg/dL (ref 8.9–10.3)
Creatinine, Ser: 1.17 mg/dL (ref 0.61–1.24)
GFR, EST NON AFRICAN AMERICAN: 57 mL/min — AB (ref >60)
Glucose, Bld: 175 mg/dL — ABNORMAL HIGH (ref 70–99)
POTASSIUM: 3.5 mmol/L (ref 3.5–5.1)
SODIUM: 137 mmol/L (ref 135–145)

## 2017-10-26 LAB — CBC
HEMATOCRIT: 25.2 % — AB (ref 39.0–52.0)
Hemoglobin: 7.6 g/dL — ABNORMAL LOW (ref 13.0–17.0)
MCH: 33 pg (ref 26.0–34.0)
MCHC: 30.2 g/dL (ref 30.0–36.0)
MCV: 109.6 fL — ABNORMAL HIGH (ref 78.0–100.0)
Platelets: 79 10*3/uL — ABNORMAL LOW (ref 150–400)
RBC: 2.3 MIL/uL — AB (ref 4.22–5.81)
RDW: 16.5 % — AB (ref 11.5–15.5)
WBC: 3.4 10*3/uL — AB (ref 4.0–10.5)

## 2017-10-26 LAB — MRSA PCR SCREENING: MRSA BY PCR: NEGATIVE

## 2017-10-26 LAB — MAGNESIUM: Magnesium: 2 mg/dL (ref 1.7–2.4)

## 2017-10-26 MED ORDER — CARVEDILOL 6.25 MG PO TABS
6.2500 mg | ORAL_TABLET | Freq: Two times a day (BID) | ORAL | Status: DC
  Administered 2017-10-28 – 2017-10-29 (×3): 6.25 mg via ORAL
  Filled 2017-10-26 (×7): qty 1

## 2017-10-26 MED ORDER — WARFARIN SODIUM 2.5 MG PO TABS
2.5000 mg | ORAL_TABLET | Freq: Once | ORAL | Status: AC
  Administered 2017-10-26: 2.5 mg via ORAL
  Filled 2017-10-26: qty 1

## 2017-10-26 MED ORDER — METOLAZONE 5 MG PO TABS
2.5000 mg | ORAL_TABLET | Freq: Once | ORAL | Status: AC
  Administered 2017-10-26: 2.5 mg via ORAL
  Filled 2017-10-26: qty 1

## 2017-10-26 NOTE — Progress Notes (Addendum)
Patient ID: [name], male   DOB: 09/07/1935, 82 y.o.   MRN: [id number]     Advanced Heart Failure Rounding Note  PCP-Cardiologist: [name], MD   Subjective:    CVP 14-15. Negative 3.1 and down 6 lbs. Cr 1.17.   Feeling OK this am. Denies SOB or further bleeding. Did have mild lightheadedness walking the halls last night.   TEE (7/23): showed EF 55-60%, mildly dilated/dysfunctional RV, severe MR likely mixed primary and secondary etiology with minimal mitral stenosis.  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   Objective:   Weight Range: 265 lb 4.8 oz (120.3 kg) Body mass index is 34.06 kg/m.   Vital Signs:   Temp:  [97.8 F (36.6 C)-98.4 F (36.9 C)] 98 F (36.7 C) (07/26 0739) Pulse Rate:  [69-84] 84 (07/26 0739) Resp:  [17-18] 17 (07/26 0551) BP: (84-102)/(51-68) 102/68 (07/26 0739) SpO2:  [93 %-99 %] 93 % (07/26 0551) Weight:  [265 lb 4.8 oz (120.3 kg)] 265 lb 4.8 oz (120.3 kg) (07/26 0600) Last BM Date: 10/25/17  Weight change: Filed Weights   10/24/17 0301 10/25/17 0128 10/26/17 0600  Weight: 274 lb (124.3 kg) 271 lb 4.8 oz (123.1 kg) 265 lb 4.8 oz (120.3 kg)   Intake/Output:   Intake/Output Summary (Last 24 hours) at 10/26/2017 0742 Last data filed at 10/26/2017 0600 Gross per 24 hour  Intake 1578.71 ml  Output 4700 ml  Net -3121.29 ml    Physical Exam    General: NAD HEENT: Normal Neck: Supple. JVP ~14 cm. Carotids 2+ bilat; no bruits. No thyromegaly or nodule noted. Cor: PMI nondisplaced. IRR, mechanical S2, 2/6 SEM RUSB.  Lungs: CTAB, normal effort. Abdomen: Soft, non-tender, non-distended, no HSM. No bruits or masses. +BS  Extremities: No cyanosis, clubbing, or rash. R and LLE 2+ edema in place. RUE DL PICC.  Neuro: Alert & orientedx3, cranial nerves grossly intact. moves all 4 extremities w/o difficulty. Affect pleasant   Telemetry   Afib 70-80s with occasional PVCs, personally reviewed.   Labs    CBC Recent Labs     10/24/17 0418 10/26/17 0500  WBC 5.0 3.4*  HGB 9.4* 7.6*  HCT 30.9* 25.2*  MCV 111.2* 109.6*  PLT 99* 79*   Basic Metabolic Panel Recent Labs    10/25/17 0358 10/26/17 0500  NA 141 137  K 4.0 3.5  CL 88* 82*  CO2 40* 41*  GLUCOSE 124* 175*  BUN 39* 42*  CREATININE 1.11 1.17  CALCIUM 9.3 9.2  MG 1.9 2.0   Liver Function Tests No results for input(s): AST, ALT, ALKPHOS, BILITOT, PROT, ALBUMIN in the last 72 hours. No results for input(s): LIPASE, AMYLASE in the last 72 hours. Cardiac Enzymes No results for input(s): CKTOTAL, CKMB, CKMBINDEX, TROPONINI in the last 72 hours.  BNP: BNP (last 3 results) Recent Labs    08/01/17 1804 10/18/17 1510  BNP 784.4* 1,073.3*    ProBNP (last 3 results) Recent Labs    03/30/17 1147 05/25/17 1044 07/10/17 1523  PROBNP 1,858* 2,749* 411.0*     D-Dimer No results for input(s): DDIMER in the last 72 hours. Hemoglobin A1C No results for input(s): HGBA1C in the last 72 hours. Fasting Lipid Panel No results for input(s): CHOL, HDL, LDLCALC, TRIG, CHOLHDL, LDLDIRECT in the last 72 hours. Thyroid Function Tests No results for input(s): TSH, T4TOTAL, T3FREE, THYROIDAB in the last 72 hours.  Invalid input(s): FREET3  Other results:   Imaging    No results found.  Medications:     Scheduled Medications: . carvedilol  12.5 mg Oral BID WC  . docusate sodium  100 mg Oral BID  . ezetimibe-simvastatin  1 tablet Oral QHS  . folic acid  1 mg Oral Daily  . insulin aspart  0-15 Units Subcutaneous TID WC  . potassium chloride  60 mEq Oral BID  . sodium chloride flush  3 mL Intravenous Q12H  . spironolactone  25 mg Oral Daily  . vitamin B-12  3,000 mcg Oral Daily  . Warfarin - Pharmacist Dosing Inpatient   Does not apply q1800    Infusions: . sodium chloride    . furosemide (LASIX) infusion 15 mg/hr (10/26/17 0521)    PRN Medications: sodium chloride, acetaminophen, fluocinonide cream, ipratropium-albuterol,  ondansetron (ZOFRAN) IV, oxymetazoline, senna, sodium chloride flush, sodium chloride flush    Patient Profile   Shelden Strollo is a 82 y.o. male with h/o mechanical AVR 1994, OSA on CPAP, Chronic afib, chronic combined CHF, CKD III, chronic coumadin therapy, CAD s/p CAGB 1994, and COPD.   Admitted from [facility] office 10/18/17 with Acute on chronic diastolic CHF.   Assessment/Plan   1. Acute on chronic diastolic CHF with prominent RV failure: - Echo this admission showed EF 55-65%, mechanical aortic valve functioning normally, mild mitral stenosis/severe mitral regurgitation, RV moderately dilated with moderately decreased systolic function, PASP 49 mmHg.  Severe MR could be contributing to the significant RV failure.  - Brisk diuresis noted. Overall weight down 35 pounds.  - Volume status remains elevated.  - Continue lasix drip 15 mg per hour, repeat metolazone again today.  - Continue spironolactone 25 mg daily.  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 It does not appear that he will be a Mitraclip candidate, Dr. [name] to see today.  3. Mechanical aortic valve: Appears to function well on echo this admission.  - Continue warfarin with INR goal 2.5-3.5. - INR 3.05 With nose bleed => heparin gtt stopped. 4. Atrial fibrillation: Chronic.  - Hold coreg this am. Restart tonight at 6.25 mg BID - Continue warfarin. Keep INR closer 2.5 with nose bleed. Heparin stopped 10/24/17 5. CAD: s/p CABG.  - No s/s of ischemia.    - Continue Vytorin.  6. COPD:  - On home oxygen. No change.  7. OSA:  - Continue home CPAP. No change.    8. CKD: Stage 3.   - Stable.  9. Constipation - Resolved. No change.  10. Deconditioning - He will need HH PT/OT and 24 hour supervision. No change.  11. Epistaxis - Heparin stopped yesterday.  - No further bleeding, despite drop in Hgb.   12. Anemia  - Hgb 7.6 today, down from 9.4 10/24/17 - Continue to follow.   Length of Stay: [place], [place]  10/26/2017, 7:42 AM  Advanced Heart Failure Team Pager [phone] (M-F; 7a - 4p)  Please contact [facility] for night-coverage after hours (4p -7a ) and weekends on [email or web address]  Patient seen with NP, agree with the above note.  He continues to diurese well, down another 6 lbs and creatinine stable. Probably still has an additional 10-15 lbs volume.  CVP 14-15. - SBP soft, agree with decrease Coreg (HR fine in chronic afib).   - Continue Lasix gtt and will give metolazone again today.   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  Mitral stenosis is minimal.  We sent off his TEE for Mitraclip evaluation, but it appears his MVA may be too small for successful clip (< 3.0 cm^2).  If he cannot get Mitraclip, options will be ongoing medical management versus MV surgery.  Surgery will be a difficult proposition given deconditioning and age.  I will have Dr. [name] see him to discuss surgical risk/outcomes if we were to undertake minimally invasive MV repair => to see today.  Loralie Champagne 10/26/2017 8:17 AM

## 2017-10-26 NOTE — Consult Note (Signed)
[address]       [place] [address]             [phone]          CARDIOTHORACIC SURGERY CONSULTATION REPORT  PCP is [name], [name], MD Referring Provider is Larey Dresser, MD Primary Cardiologist is Belva Crome, MD  Reason for consultation:  Severe mitral regurgitation  HPI:  Patient is an 82 year old male with history of long-standing rheumatic heart disease status post aortic valve replacement with a bileaflet mechanical valve and coronary artery bypass grafting x2 in 6301, chronic diastolic congestive heart failure, long-standing persistent atrial fibrillation on warfarin anticoagulation, stage III chronic kidney disease, obstructive sleep apnea on CPAP, type 2 diabetes mellitus, and cerebrovascular disease with previous carotid endarterectomy who has been referred for surgical consultation to discuss treatment options for management of severe symptomatic primary mitral regurgitation with acute exacerbation of chronic diastolic congestive heart failure and severe right heart failure.  Patient's cardiac history dates back to his childhood when he was treated for rheumatic fever.  He has known of the presence of a heart murmur ever since.  In 1994 he underwent aortic valve replacement using a bileaflet mechanical prosthetic valve with coronary artery bypass grafting x2.  At the time he lived in [place] and details of his surgical procedure are not currently available.  According to the patient he recovered uneventfully although he did have atrial fibrillation.  He retired in 2005 and moved to [place] with his wife.  He has been followed intermittently ever since by Dr. Tamala Julian.  He apparently underwent DC cardioversion on several occasions but was unable to maintain sinus rhythm.  Eventually decision was made to treat his atrial fibrillation with rate control as he was already chronically anticoagulated because of his mechanical valve.  The  patient states that he has a long history of symptoms of exertional shortness of breath and lower extremity edema that dates back to his original cardiac surgery in 1994.  However, symptoms were relatively mild and stable for many years.  Over the past 4 to 6 months he developed fairly rapid decline in his exercise tolerance with worsening exertional shortness of breath, fatigue, abdominal swelling, and severe lower extremity edema.  He was hospitalized in May with acute exacerbation of chronic diastolic congestive heart failure and COPD.  He was discharged home on home oxygen therapy.  He was seen in follow-up by Dr. Tamala Julian in the office recently noted to have progressive abdominal swelling, lower extremity edema, and anasarca with 50 pound weight gain since his admission in May.  He was admitted to the hospital and evaluated by the advanced heart failure team.  Echocardiogram revealed preserved left ventricular systolic function with ejection fraction estimated 55 to 65%.  Mechanical aortic valve was functioning normally.  There was severe mitral regurgitation with mild mitral stenosis.  Right ventricular size was moderately enlarged with moderately decreased function.  Pulmonary artery pressures were estimated 49 mmHg.  The patient underwent transesophageal echocardiogram confirming similar findings.  There was severe mitral regurgitation with moderate calcification and restricted leaflet mobility involving both leaflets of the mitral valve.  Mean transvalvular gradient across the mitral valve was estimated 5 mmHg but the pressure half-time was greater than 2 cm suggesting minimal mitral stenosis.  There was severe left and right atrial enlargement.  There was flow reversal in the pulmonary veins.  Consultation from the structural heart team was recommended and the patient  has been seen previously by Dr. Burt Knack.  Patient's mitral valve anatomy is felt to be relatively unfavorable for percutaneous edge to edge  MitraClip repair.  Cardiothoracic surgical consultation was requested.  The patient is married and lives locally in [place] with his wife.  He has remained physically active and functionally independent up until recently.  He admits to greater than 2-year history of exertional shortness of breath and fatigue, but he states that symptoms have dramatically progressed over the past 4 to 6 months.  He now gets short of breath with minimal activity and remains visibly short of breath at rest laying in bed.  He has had significant abdominal distention and severe lower extremity edema with 50 pound weight gain over the past 2 months.  He has not had any chest pain or chest tightness.  He has not had any palpitations, dizzy spells, nor syncope.  He has been responding to IV diuresis and reports that he feels better than he did at the time of admission.  Patient states that his mobility is limited only by exertional shortness of breath and severe swelling of his lower legs.  He has known poor dentition and has not seen a dentist in more than 15 years.  Past Medical History:  Diagnosis Date  . Carotid artery disease ([facility]) 1994   s/p left carotid endarerectomy   . Chronic atrial fibrillation ([facility])    a. on coumadin   . Chronic diastolic CHF (congestive heart failure) ([place])   . Hx of CABG    a. 1994  . Hypertension   . OSA (obstructive sleep apnea)   . Rheumatic fever   . S/P AVR (aortic valve replacement)    a. mechanical valve 1996  . Subclavian bypass stenosis ([place])   . Vitamin B12 deficiency     Past Surgical History:  Procedure Laterality Date  . [place]   replaced due to aortic stenosis, [employer] mechanical prostesis  . CAROTID ENDARTERECTOMY Left 1997   subclavian bypass Done in [place]  . CORONARY ARTERY BYPASS GRAFT  1994   w SVG to RCA and SVG to circumflex  . heart bypass     Done in [place]  . TEE WITHOUT CARDIOVERSION N/A 10/23/2017   Procedure:  TRANSESOPHAGEAL ECHOCARDIOGRAM (TEE);  Surgeon: Larey Dresser, MD;  Location[facility] END[facility]COPY;  Service: Cardiovascular;  Laterality: N/A;    Family History  Problem Relation Age of Onset  . Cancer Father        lung   . Hypertension Mother     Social History   Socioeconomic History  . Marital status: Married    Spouse name: Not on file  . Number of children: Not on file  . Years of education: Not on file  . Highest education level: Not on file  Occupational History  . Not on file  Social Needs  . Financial resource strain: Not on file  . Food insecurity:    Worry: Not on file    Inability: Not on file  . Transportation needs:    Medical: Not on file    Non-medical: Not on file  Tobacco Use  . Smoking status: Former Smoker    Packs/day: 1.00    Years: 30.00    Pack years: 30.00    Types: Cigarettes    Last attempt to quit: 04/03/1992    Years since quitting: 25.5  . Smokeless tobacco: Never Used  Substance and Sexual Activity  . Alcohol use: Yes  Comment: 4-6 ounces of wine daily  . Drug use: No    Comment: Half a cup a day.  Marland Kitchen Sexual activity: Not on file  Lifestyle  . Physical activity:    Days per week: Not on file    Minutes per session: Not on file  . Stress: Not on file  Relationships  . Social connections:    Talks on phone: Not on file    Gets together: Not on file    Attends religious service: Not on file    Active member of club or organization: Not on file    Attends meetings of clubs or organizations: Not on file    Relationship status: Not on file  . Intimate partner violence:    Fear of current or ex partner: Not on file    Emotionally abused: Not on file    Physically abused: Not on file    Forced sexual activity: Not on file  Other Topics Concern  . Not on file  Social History Narrative  . Not on file    Prior to Admission medications   Medication Sig Start Date End Date Taking? Authorizing Provider  acetaminophen (TYLENOL) 500 MG  tablet Take 500-1,000 mg by mouth daily as needed (for pain).    Yes [provider]  carvedilol (COREG) 25 MG tablet Take 1 tablet (25 mg total) by mouth 2 (two) times daily with a meal. 04/12/17  Yes Belva Crome, MD  Cholecalciferol (VITAMIN D3) 2000 units capsule Take 1 capsule (2,000 Units total) by mouth daily. 04/05/17  Yes [name], [name], MD  Cyanocobalamin (VITAMIN B-12) 3000 MCG SUBL Take 3,000 mcg by mouth daily.    Yes [provider]  dextromethorphan-guaiFENesin (MUCINEX DM) 30-600 MG 12hr tablet Take 1 tablet by mouth 2 (two) times daily. 08/03/17  Yes Doreatha Lew, MD  Dietary Management Product [facility]) TABS Take 1 tablet by mouth daily. 04/05/17  Yes [name], [name], MD  ezetimibe-simvastatin (VYTORIN) 10-20 MG tablet Take 1 tablet by mouth at bedtime. 04/05/17  Yes [name], [name], MD  fluocinonide cream (LIDEX) 8.46 % Apply 1 application topically 2 (two) times daily as needed (For legs).   Yes [provider]  folic acid (FOLVITE) 1 MG tablet Take 1 tablet (1 mg total) by mouth daily. 12/02/14  Yes Brunetta Genera, MD  hydrocortisone cream 1 % Apply 1 application topically as needed for itching (for legs).   Yes [provider]  metFORMIN (GLUCOPHAGE-XR) 750 MG 24 hr tablet TAKE 2 TABLETS (1,500 MG TOTAL) BY MOUTH DAILY. 07/03/17  Yes Elayne Snare, MD  NON FORMULARY BiPAP: At bedtime   Yes [provider]  PROAIR HFA 108 (90 Base) MCG/ACT inhaler INHALE 2 PUFFS INTO THE LUNGS EVERY 6 HOURS AS NEEDED (FOR CHEST CONGESTION). 10/16/17  Yes [name], [name], MD  torsemide (DEMADEX) 100 MG tablet Take 1 tablet (100 mg total) by mouth daily. 09/13/17  Yes [name], [name], MD  warfarin (COUMADIN) 5 MG tablet Take 5 mg by mouth daily EXCEPT for two (2) days a week take 2.5 mg by mouth daily on Monday and Friday. Take as directed per [facility].   Yes [provider]  bumetanide (BUMEX) 2 MG tablet Take 4  mg with Torsemide. Take another 4 mg in 4 hrs 10/17/17   [name], [name], MD  glucose blood (ONE TOUCH ULTRA TEST) test strip USE AS DIRECTED TO TEST BLOOD GLUCOSE  EVERY OTHER DAY DX: 250.00  08/23/16   Elayne Snare, MD  glucose blood (ONETOUCH VERIO) test strip Use as instructed to check blood sugar once daily. Dx Code E11.8 05/07/17   Elayne Snare, MD  ipratropium-albuterol (DUONEB) 0.5-2.5 (3) MG/3ML SOLN Take 3 mLs by nebulization 4 (four) times daily. 10/17/17   [name], [name], MD  NON FORMULARY ONE TOUCH ULTRA TEST STRIPS  AND LANCETS    [provider]  [facility] DELICA LANCETS FINE MISC Use to obtain a blood specimen every day Dx code E11.8 05/07/17   Elayne Snare, MD    Current Facility-Administered Medications  Medication Dose Route Frequency Provider Last Rate Last Dose  . 0.9 %  sodium chloride infusion  250 mL Intravenous PRN Georgiana Shore, NP      . acetaminophen (TYLENOL) tablet 650 mg  650 mg Oral Q4H PRN Georgiana Shore, NP      . carvedilol (COREG) tablet 6.25 mg  6.25 mg Oral BID WC Shirley Friar, PA-C      . docusate sodium (COLACE) capsule 100 mg  100 mg Oral BID Georgiana Shore, NP   100 mg at 10/26/17 3810  . ezetimibe-simvastatin (VYTORIN) 10-20 MG per tablet 1 tablet  1 tablet Oral QHS Georgiana Shore, NP   1 tablet at 10/25/17 2102  . fluocinonide cream (LIDEX) 1.75 % 1 application  1 application Topical BID PRN Georgiana Shore, NP      . folic acid (FOLVITE) tablet 1 mg  1 mg Oral Daily Georgiana Shore, NP   1 mg at 10/26/17 1025  . furosemide (LASIX) 250 mg in dextrose 5 % 250 mL (1 mg/mL) infusion  15 mg/hr Intravenous Continuous Georgiana Shore, NP 15 mL/hr at 10/26/17 1023 15 mg/hr at 10/26/17 1023  . insulin aspart (novoLOG) injection 0-15 Units  0-15 Units Subcutaneous TID WC Georgiana Shore, NP   2 Units at 10/26/17 0825  . ipratropium-albuterol (DUONEB) 0.5-2.5 (3) MG/3ML nebulizer solution 3 mL  3 mL Nebulization Q4H PRN Georgiana Shore, NP       . ondansetron [facility]) injection 4 mg  4 mg Intravenous Q6H PRN Georgiana Shore, NP      . oxymetazoline (AFRIN) 0.05 % nasal spray 2 spray  2 spray Each Nare BID PRN Georgiana Shore, NP   2 spray at 10/24/17 1048  . potassium chloride SA (K-DUR,KLOR-CON) CR tablet 60 mEq  60 mEq Oral BID Georgiana Shore, NP   60 mEq at 10/26/17 8527  . senna (SENOKOT) tablet 8.6 mg  1 tablet Oral Daily PRN Georgiana Shore, NP      . sodium chloride flush (NS) 0.9 % injection 10-40 mL  10-40 mL Intracatheter PRN Georgiana Shore, NP      . sodium chloride flush (NS) 0.9 % injection 3 mL  3 mL Intravenous Q12H Georgiana Shore, NP   3 mL at 10/26/17 7824  . sodium chloride flush (NS) 0.9 % injection 3 mL  3 mL Intravenous PRN Georgiana Shore, NP      . spironolactone (ALDACTONE) tablet 25 mg  25 mg Oral Daily Georgiana Shore, NP   25 mg at 10/26/17 0827  . vitamin B-12 (CYANOCOBALAMIN) tablet 3,000 mcg  3,000 mcg Oral Daily Georgiana Shore, NP   3,000 mcg at 10/26/17 2353  . warfarin (COUMADIN) tablet 2.5 mg  2.5 mg Oral ONCE-1800 Belva Crome, MD      . Warfarin - Pharmacist Dosing Inpatient  Does not apply q1800 Georgiana Shore, NP        Allergies  Allergen Reactions  . Rifampin Rash    May have been caused by Vancomycin or Rifampin (??)  . Vancomycin Rash    May have been caused by Vancomycin or Rifampin (??)      Review of Systems:   General:  fair appetite, decreased energy, + weight gain, no weight loss, no fever  Cardiac:  no chest pain with exertion, no chest pain at rest, +SOB with exertion, + resting SOB, + PND, + orthopnea, no palpitations, + arrhythmia, + atrial fibrillation, + LE edema, no dizzy spells, no syncope  Respiratory:  + shortness of breath, + home oxygen, no productive cough, no dry cough, no bronchitis, no wheezing, no hemoptysis, no asthma, no pain with inspiration or cough, + sleep apnea, + CPAP at night  GI:   no difficulty swallowing, no reflux, no frequent heartburn, no  hiatal hernia, no abdominal pain, no constipation, no diarrhea, no hematochezia, no hematemesis, no melena  GU:   no dysuria,  no frequency, no urinary tract infection, no hematuria, no enlarged prostate, no kidney stones, + kidney disease  Vascular:  no pain suggestive of claudication, no pain in feet, no leg cramps, no varicose veins, no DVT, no non-healing foot ulcer  Neuro:   no stroke, no TIA's, no seizures, no headaches, no temporary blindness one eye,  no slurred speech, no peripheral neuropathy, no chronic pain, no instability of gait, no memory/cognitive dysfunction  Musculoskeletal: + arthritis , no joint swelling, no myalgias, no difficulty walking, normal mobility   Skin:   no rash, no itching, no skin infections, no pressure sores or ulcerations  Psych:   no anxiety, no depression, no nervousness, no unusual recent stress  Eyes:   no blurry vision, no floaters, no recent vision changes, + wears glasses or contacts  ENT:   no hearing loss, no loose or painful teeth, no dentures, last saw dentist > 15 years ago  Hematologic:  + easy bruising, no abnormal bleeding, no clotting disorder, no frequent epistaxis  Endocrine:  + diabetes, does not check CBG's at home     Physical Exam:   BP 102/68 (BP Location: Left Arm)   Pulse 84   Temp 98 F (36.7 C) (Oral)   Resp 17   Ht 6\' 2"  (1.88 m)   Wt 265 lb 4.8 oz (120.3 kg)   SpO2 93%   BMI 34.06 kg/m   General:  Mildly obese male, dyspneic with speech  HEENT:  Unremarkable   Neck:   + JVD, no bruits, no adenopathy   Chest:   clear to auscultation, symmetrical breath sounds, no wheezes, no rhonchi   CV:   Irregular rate and rhythm, grade II/VI systolic murmur   Abdomen:  soft, non-tender, no masses, distended  Extremities:  warm, well-perfused, pulses not palpable, + severe bilateral lower extremity edema  Rectal/GU  Deferred  Neuro:   Grossly non-focal and symmetrical throughout  Skin:   Clean and dry, no rashes, no breakdown, +  venous stasis disease bilateral lower legs  Diagnostic Tests:  Lab Results: Recent Labs    10/24/17 0418 10/26/17 0500  WBC 5.0 3.4*  HGB 9.4* 7.6*  HCT 30.9* 25.2*  PLT 99* 79*   BMET:  Recent Labs    10/25/17 0358 10/26/17 0500  NA 141 137  K 4.0 3.5  CL 88* 82*  CO2 40* 41*  GLUCOSE 124* 175*  BUN 39* 42*  CREATININE 1.11 1.17  CALCIUM 9.3 9.2    CBG (last 3)  Recent Labs    10/25/17 1625 10/25/17 2155 10/26/17 0738  GLUCAP 149* 128* 126*   PT/INR:   Recent Labs    10/26/17 0500  LABPROT 31.3*  INR 3.05    CXR:  PORTABLE CHEST 1 VIEW  COMPARISON:  10/18/2017, 08/01/2017  FINDINGS: Post sternotomy changes. Right upper extremity catheter tip projects over the SVC. Cardiomegaly with vascular congestion. Small left pleural effusion with left basilar airspace disease. Aortic atherosclerosis.  IMPRESSION: 1. Right upper extremity catheter tip overlies the SVC. 2. Cardiomegaly with small left pleural effusion and left basilar airspace disease.   Electronically Signed   By: Donavan Foil M.D.   On: 10/24/2017 19:54   Transthoracic Echocardiography  Patient:    Ayham, Word MR #:       [id number] Study Date: 02/07/2017 Gender:     M Age:        27 Height:     188 cm Weight:     121.4 kg BSA:        2.55 m^2 Pt. Status: Room:   Ivar Bury, MD  REFERRING    Belva Crome, MD  ATTENDING    Jenkins Rouge, M.D.  SONOGRAPHER  Diamond Nickel  PERFORMING   [facility] Outpatient  cc:  ------------------------------------------------------------------- LV EF: 55% -   60%  ------------------------------------------------------------------- Indications:      R06.02 Shortness of breath.  ------------------------------------------------------------------- History:   PMH:  Chronic atrial fibrillation. Mechanical aortic valve.  Congestive heart failure.  Risk factors:   Hypertension.  ------------------------------------------------------------------- Study Conclusions  - Left ventricle: Wall thickness was increased in a pattern of mild   LVH. Systolic function was normal. The estimated ejection   fraction was in the range of 55% to 60%. - Aortic valve: Normal appearing mechanical AVR with no peri   valvular regurgitation. - Mitral valve: Moderately calcified annulus. Moderately thickened   leaflets . There was moderate regurgitation. - Left atrium: The atrium was severely dilated. - Atrial septum: No defect or patent foramen ovale was identified.  ------------------------------------------------------------------- Labs, prior tests, procedures, and surgery: Coronary artery bypass grafting.  ------------------------------------------------------------------- Study data:  Comparison was made to the study of 01/19/2014.  Study status:  Routine.  Procedure:  The patient reported no pain pre or post test. Transthoracic echocardiography. Image quality was adequate.  Study completion:  There were no complications. Transthoracic echocardiography.  M-mode, complete 2D, spectral Doppler, and color Doppler.  Birthdate:  Patient birthdate: [birth year].  Age:  Patient is 82 yr old.  Sex:  Gender: male. BMI: 34.3 kg/m^2.  Blood pressure:     109/61  Patient status: Outpatient.  Study date:  Study date: 02/07/2017. Study time: 08:44 AM.  Location:  [facility] [facility]  -------------------------------------------------------------------  ------------------------------------------------------------------- Left ventricle:   Wall thickness was increased in a pattern of mild LVH.   Systolic function was normal. The estimated ejection fraction was in the range of 55% to 60%.  ------------------------------------------------------------------- Aortic valve:  Normal appearing mechanical AVR with no peri valvular regurgitation.  Doppler:     VTI ratio of LVOT  to aortic valve: 0.3. Peak velocity ratio of LVOT to aortic valve: 0.29. Mean velocity ratio of LVOT to aortic valve: 0.3.    Mean gradient (S): 8 mm Hg. Peak gradient (S): 17 mm Hg.  ------------------------------------------------------------------- Mitral valve:   Moderately calcified annulus. Moderately thickened  leaflets .  Doppler:  There was moderate regurgitation.    Peak gradient (D): 11 mm Hg.  ------------------------------------------------------------------- Left atrium:  The atrium was severely dilated.  ------------------------------------------------------------------- Atrial septum:  No defect or patent foramen ovale was identified.   ------------------------------------------------------------------- Right ventricle:  The cavity size was normal. Wall thickness was normal. Systolic function was normal.  ------------------------------------------------------------------- Pulmonic valve:    Doppler:  There was mild regurgitation.  ------------------------------------------------------------------- Tricuspid valve:   Doppler:  There was mild regurgitation.  ------------------------------------------------------------------- Right atrium:  The atrium was normal in size.  ------------------------------------------------------------------- Systemic veins: Inferior vena cava: The vessel was normal in size. The respirophasic diameter changes were in the normal range (= 50%), consistent with normal central venous pressure.  ------------------------------------------------------------------- Measurements   Left ventricle                              Value        Reference  LV ID, ED, PLAX chordal             (H)     59.8  mm     43 - 52  LV ID, ES, PLAX chordal             (H)     45.6  mm     23 - 38  LV fx shortening, PLAX chordal      (L)     24    %      >=29  LV PW thickness, ED                         12.6  mm     ---------  IVS/LV PW ratio, ED                          1.1          <=1.3  LV e&', lateral                              6.14  cm/s   ---------  LV E/e&', lateral                            26.87        ---------  LV e&', medial                               4.28  cm/s   ---------  LV E/e&', medial                             38.55        ---------  LV e&', average                              5.21  cm/s   ---------  LV E/e&', average                            31.67        ---------    Ventricular septum  Value        Reference  IVS thickness, ED                           13.9  mm     ---------    LVOT                                        Value        Reference  LVOT peak velocity, S                       61.3  cm/s   ---------  LVOT mean velocity, S                       39.3  cm/s   ---------  LVOT VTI, S                                 13.4  cm     ---------  LVOT peak gradient, S                       2     mm Hg  ---------    Aortic valve                                Value        Reference  Aortic valve peak velocity, S               209   cm/s   ---------  Aortic valve mean velocity, S               133   cm/s   ---------  Aortic valve VTI, S                         44    cm     ---------  Aortic mean gradient, S                     8     mm Hg  ---------  Aortic peak gradient, S                     17    mm Hg  ---------  VTI ratio, LVOT/AV                          0.3          ---------  Velocity ratio, peak, LVOT/AV               0.29         ---------  Velocity ratio, mean, LVOT/AV               0.3          ---------    Left atrium                                 Value        Reference  LA ID, A-P, ES  69    mm     ---------  LA ID/bsa, A-P                      (H)     2.7   cm/m^2 <=2.2  LA volume, S                                285   ml     ---------  LA volume/bsa, S                            111.6 ml/m^2 ---------  LA volume, ES, 1-p A4C                       223   ml     ---------  LA volume/bsa, ES, 1-p A4C                  87.3  ml/m^2 ---------  LA volume, ES, 1-p A2C                      359   ml     ---------  LA volume/bsa, ES, 1-p A2C                  140.5 ml/m^2 ---------    Mitral valve                                Value        Reference  Mitral E-wave peak velocity                 165   cm/s   ---------  Mitral A-wave peak velocity                 45    cm/s   ---------  Mitral deceleration time            (H)     250   ms     150 - 230  Mitral peak gradient, D                     11    mm Hg  ---------  Mitral E/A ratio, peak                      2.9          ---------  Mitral maximal regurg velocity,             445   cm/s   ---------  PISA  Mitral regurg VTI, PISA                     151   cm     ---------    Right ventricle                             Value        Reference  RV s&', lateral, S                           6.8   cm/s   ---------  Legend: (L)  and  (H)  mark values outside specified reference range.  ------------------------------------------------------------------- Prepared and Electronically Authenticated by  Jenkins Rouge, M.D. 2018-11-07T10:20:06    Transthoracic Echocardiography  Patient:    Marcanthony, Sleight MR #:       [id number] Study Date: 10/19/2017 Gender:     M Age:        89 Height:     188 cm Weight:     135.2 kg BSA:        2.71 m^2 Pt. Status: Room:       3E03C   ADMITTING    Belva Crome, MD  ATTENDING    Belva Crome, MD  ORDERING     Belva Crome, MD  [place]wood Court, MD  SONOGRAPHER  Dustin Flock, RCS  PERFORMING   [facility], Inpatient  cc:  ------------------------------------------------------------------- LV EF: 55% -   65%  ------------------------------------------------------------------- Indications:      CHF - 428.0.  ------------------------------------------------------------------- History:   PMH:   Atrial fibrillation.   Coronary artery disease. Congestive heart failure.  Aortic valve disease.  Risk factors: Hypertension. Diabetes mellitus. Dyslipidemia.  ------------------------------------------------------------------- Study Conclusions  - Left ventricle: The cavity size was mildly dilated. There was   mild concentric hypertrophy. Systolic function was normal. The   estimated ejection fraction was in the range of 55% to 65%. Wall   motion was normal; there were no regional wall motion   abnormalities. - Ventricular septum: The contour showed diastolic flattening and   systolic flattening. - Aortic valve: There was trivial regurgitation. Mean gradient (S):   11 mm Hg. Peak gradient (S): 27 mm Hg. Valve area (VTI): 1.87   cm^2. Valve area (Vmax): 1.45 cm^2. Valve area (Vmean): 1.87   cm^2. - Mitral valve: The findings are consistent with mild stenosis.   There was severe regurgitation. Valve area by continuity equation   (using LVOT flow): 1.85 cm^2. - Left atrium: The atrium was severely dilated. - Right ventricle: The cavity size was moderately dilated. Wall   thickness was normal. Systolic function was moderately reduced. - Right atrium: The atrium was severely dilated. - Pulmonary arteries: Systolic pressure was moderately increased.   PA peak pressure: 49 mm Hg (S). - Inferior vena cava: The vessel was dilated. The respirophasic   diameter changes were blunted (< 50%), consistent with elevated   central venous pressure. - Pericardium, extracardiac: There was no pericardial effusion.  ------------------------------------------------------------------- Labs, prior tests, procedures, and surgery: Coronary artery bypass grafting.  ------------------------------------------------------------------- Study data:  Comparison was made to the study of 02/07/2017.  Study status:  Routine.  Procedure:  The patient reported no pain pre or post test. Transthoracic echocardiography. Image quality  was adequate.  Study completion:  There were no complications. Transthoracic echocardiography.  M-mode, complete 2D, spectral Doppler, and color Doppler.  Birthdate:  Patient birthdate: 1935-04-04.  Age:  Patient is 82 yr old.  Sex:  Gender: male. BMI: 38.2 kg/m^2.  Blood pressure:     107/65  Patient status: Inpatient.  Study date:  Study date: 10/19/2017. Study time: 11:10 AM.  Location:  [facility] laboratory.  -------------------------------------------------------------------  ------------------------------------------------------------------- Left ventricle:  The cavity size was mildly dilated. There was mild concentric hypertrophy. Systolic function was normal. The estimated ejection fraction was in the range of 55% to 65%. Wall motion was normal; there were no regional wall motion abnormalities. The study was not technically sufficient to allow evaluation of LV diastolic dysfunction due to atrial fibrillation.  ------------------------------------------------------------------- Aortic valve:  Mechanical aortic valve is functioning well, normal transaortic gradients. Mobility was not restricted.  Doppler: There was no stenosis.   There was trivial regurgitation.    VTI ratio of LVOT to aortic valve: 0.45. Valve area (VTI): 1.87 cm^2. Indexed valve area (VTI): 0.69 cm^2/m^2. Peak velocity ratio of LVOT to aortic valve: 0.35. Valve area (Vmax): 1.45 cm^2. Indexed valve area (Vmax): 0.54 cm^2/m^2. Mean velocity ratio of LVOT to aortic valve: 0.45. Valve area (Vmean): 1.87 cm^2. Indexed valve area (Vmean): 0.69 cm^2/m^2.    Mean gradient (S): 11 mm Hg. Peak gradient (S): 27 mm Hg.  ------------------------------------------------------------------- Aorta:  Aortic root: The aortic root was normal in size.  ------------------------------------------------------------------- Mitral valve:   Moderately thickened, moderately calcified leaflets . Mobility was not restricted.  Doppler:    The findings are consistent with mild stenosis.   There was severe regurgitation. Valve area by pressure half-time: 2.62 cm^2. Indexed valve area by pressure half-time: 0.97 cm^2/m^2. Valve area by continuity equation (using LVOT flow): 1.85 cm^2. Indexed valve area by continuity equation (using LVOT flow): 0.68 cm^2/m^2.    Mean gradient (D): 4 mm Hg. Peak gradient (D): 14 mm Hg.  ------------------------------------------------------------------- Left atrium:  The atrium was severely dilated.  ------------------------------------------------------------------- Right ventricle:  The cavity size was moderately dilated. Wall thickness was normal. Systolic function was moderately reduced.  ------------------------------------------------------------------- Ventricular septum:   The contour showed diastolic flattening and systolic flattening.  ------------------------------------------------------------------- Pulmonic valve:    Structurally normal valve.   Cusp separation was normal.  Doppler:  Transvalvular velocity was within the normal range. There was no evidence for stenosis. There was mild regurgitation.  ------------------------------------------------------------------- Tricuspid valve:   Structurally normal valve.    Doppler: Transvalvular velocity was within the normal range. There was mild regurgitation.  ------------------------------------------------------------------- Pulmonary artery:   The main pulmonary artery was normal-sized. Systolic pressure was moderately increased.  ------------------------------------------------------------------- Right atrium:  The atrium was severely dilated.  ------------------------------------------------------------------- Pericardium:  There was no pericardial effusion.  ------------------------------------------------------------------- Systemic veins: Inferior vena cava: The vessel was dilated. The  respirophasic diameter changes were blunted (< 50%), consistent with elevated central venous pressure.  ------------------------------------------------------------------- Measurements   Left ventricle                           Value          Reference  LV ID, ED, PLAX chordal          (H)     58    mm       43 - 52  LV ID, ES, PLAX chordal          (H)     42    mm       23 - 38  LV fx shortening, PLAX chordal   (L)     28    %        >=29  LV PW thickness, ED                      11    mm       ----------  IVS/LV PW ratio, ED                      1              <=1.3  Stroke volume, 2D  90    ml       ----------  Stroke volume/bsa, 2D                    33    ml/m^2   ----------  LV e&', lateral                           8.38  cm/s     ----------  LV E/e&', lateral                         22.32          ----------  LV e&', medial                            5.87  cm/s     ----------  LV E/e&', medial                          31.86          ----------  LV e&', average                           7.13  cm/s     ----------  LV E/e&', average                         26.25          ----------    Ventricular septum                       Value          Reference  IVS thickness, ED                        11    mm       ----------    LVOT                                     Value          Reference  LVOT ID, S                               23    mm       ----------  LVOT area                                4.15  cm^2     ----------  LVOT peak velocity, S                    90    cm/s     ----------  LVOT mean velocity, S                    67.2  cm/s     ----------  LVOT VTI, S                              21.8  cm       ----------  LVOT peak gradient, S                    3     mm Hg    ----------    Aortic valve                             Value          Reference  Aortic valve peak velocity, S            258   cm/s     ----------  Aortic valve mean velocity, S             149   cm/s     ----------  Aortic valve VTI, S                      48.5  cm       ----------  Aortic mean gradient, S                  11    mm Hg    ----------  Aortic peak gradient, S                  27    mm Hg    ----------  VTI ratio, LVOT/AV                       0.45           ----------  Aortic valve area, VTI                   1.87  cm^2     ----------  Aortic valve area/bsa, VTI               0.69  cm^2/m^2 ----------  Velocity ratio, peak, LVOT/AV            0.35           ----------  Aortic valve area, peak velocity         1.45  cm^2     ----------  Aortic valve area/bsa, peak              0.54  cm^2/m^2 ----------  velocity  Velocity ratio, mean, LVOT/AV            0.45           ----------  Aortic valve area, mean velocity         1.87  cm^2     ----------  Aortic valve area/bsa, mean              0.69  cm^2/m^2 ----------  velocity    Aorta                                    Value          Reference  Aortic root ID, ED                       32    mm       ----------    Left atrium                              Value  Reference  LA ID, A-P, ES                           58    mm       ----------  LA ID/bsa, A-P                           2.14  cm/m^2   <=2.2  LA volume, S                             218   ml       ----------  LA volume/bsa, S                         80.5  ml/m^2   ----------  LA volume, ES, 1-p A4C                   241   ml       ----------  LA volume/bsa, ES, 1-p A4C               89    ml/m^2   ----------  LA volume, ES, 1-p A2C                   189   ml       ----------  LA volume/bsa, ES, 1-p A2C               69.8  ml/m^2   ----------    Mitral valve                             Value          Reference  Mitral E-wave peak velocity              187   cm/s     ----------  Mitral A-wave peak velocity              58    cm/s     ----------  Mitral mean velocity, D                  83.6  cm/s     ----------  Mitral deceleration time                  222   ms       150 - 230  Mitral pressure half-time                84    ms       ----------  Mitral mean gradient, D                  4     mm Hg    ----------  Mitral peak gradient, D                  14    mm Hg    ----------  Mitral E/A ratio, peak                   3.2            ----------  Mitral valve area, PHT, DP               2.62  cm^2     ----------  Mitral valve area/bsa, PHT, DP           0.97  cm^2/m^2 ----------  Mitral valve area, LVOT                  1.85  cm^2     ----------  continuity  Mitral valve area/bsa, LVOT              0.68  cm^2/m^2 ----------  continuity  Mitral annulus VTI, D                    48.8  cm       ----------    Pulmonary arteries                       Value          Reference  PA pressure, S, DP               (H)     49    mm Hg    <=30    Tricuspid valve                          Value          Reference  Tricuspid regurg peak velocity           320   cm/s     ----------  Tricuspid peak RV-RA gradient            41    mm Hg    ----------    Right atrium                             Value          Reference  RA ID, S-I, ES, A4C              (H)     84.7  mm       34 - 49  RA area, ES, A4C                 (H)     38.5  cm^2     8.3 - 19.5  RA volume, ES, A/L                       143   ml       ----------  RA volume/bsa, ES, A/L                   52.8  ml/m^2   ----------    Systemic veins                           Value          Reference  Estimated CVP                            8     mm Hg    ----------    Right ventricle                          Value          Reference  RV ID, minor axis, ED, A4C base          31  mm       ----------  TAPSE                                    27.3  mm       ----------  RV pressure, S, DP               (H)     49    mm Hg    <=30  RV s&', lateral, S                        5.87  cm/s     ----------  Legend: (L)  and  (H)  mark values outside specified reference  range.  ------------------------------------------------------------------- Prepared and Electronically Authenticated by  Ena Dawley, M.D. 2019-07-19T13:29:33   Transesophageal Echocardiography  Patient:    Weldon, Nouri MR #:       [id number] Study Date: 10/23/2017 Gender:     M Age:        72 Height:     188 cm Weight:     127.1 kg BSA:        2.62 m^2 Pt. Status: Room:       3E03C   ADMITTING    Belva Crome, MD  ATTENDING    Belva Crome, MD  PERFORMING   Loralie Champagne, M.D.  SONOGRAPHER  Johny Chess, RDCS, CCT  ORDERING     Smith, [place], Ashley M  cc:  ------------------------------------------------------------------- LV EF: 55% -   60%  ------------------------------------------------------------------- Indications:      Mitral regurgitation 424.0.  ------------------------------------------------------------------- Study Conclusions  - Left ventricle: The cavity size was normal. Wall thickness was   increased in a pattern of mild LVH. Systolic function was normal.   The estimated ejection fraction was in the range of 55% to 60%.   Wall motion was normal; there were no regional wall motion   abnormalities. D-shaped septum consistent with RV pressure/volume   overload. - Aortic valve: Mechanical aortic valve with no significant   regurgitation, unable to obtain mean gradient as transgastric   windows were poor. - Aorta: Normal caliber ascending aorta. - Mitral valve: The mitral valve was calcified and thickened,   especially the posterior leaflet that was restricted. Mean   gradient 5 mmHg but MVA by PHT > 2 cm^2. I think there was only   minimal mitral stenosis. There was severe mitral regurgitation,   likely due to restriction of posterior leaflet and annular   dilation from severe LAE (mixed functional and primary etiology).   ERO 0.42 cm^2 by PISA. - Left atrium: The atrium was severely dilated. No  evidence of   thrombus in the atrial cavity or appendage. - Pulmonary veins: There was systolic flow reversal in the   pulmonary vein doppler pattern. - Right ventricle: The cavity size was mildly dilated. Systolic   function was mildly reduced. - Right atrium: The atrium was moderately dilated. - Atrial septum: No defect or patent foramen ovale was identified. - Tricuspid valve: Mild tricuspid regurgitation with peak RV-RA   gradient 25 mmHg.  Impressions:  - Severe mitral regurgitation with minimal mitral stenosis (suspect   mildly elevated mean gradient with normal pressure hafl-time was   due to high flow from mitral regurgitation). 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 Suspect mixed primary and  secondary etiology.  ------------------------------------------------------------------- Study data:   Study status:  Routine.  Consent:  The risks, benefits, and alternatives to the procedure were explained to the patient and informed consent was obtained.  Procedure:  Initial setup. The patient was brought to the laboratory. Surface ECG leads were monitored. Sedation. Sedation was administered by anesthesiology staff. Transesophageal echocardiography. A transesophageal probe was inserted by the attending cardiologistwithout difficulty. Image quality was adequate.  Study completion:  The patient tolerated the procedure well. There were no complications.          Diagnostic transesophageal echocardiography.  2D and color Doppler.  Birthdate:  Patient birthdate: 1936/02/01.  Age:  Patient is 82 yr old.  Sex:  Gender: male.    BMI: 36 kg/m^2.  Blood pressure:     120/49  Patient status:  Inpatient.  Study date:  Study date: 10/23/2017. Study time: 11:25 AM.  Location:  Endoscopy.  -------------------------------------------------------------------  ------------------------------------------------------------------- Left ventricle:  The cavity size was normal. Wall thickness  was increased in a pattern of mild LVH. Systolic function was normal. The estimated ejection fraction was in the range of 55% to 60%. Wall motion was normal; there were no regional wall motion abnormalities. D-shaped septum consistent with RV pressure/volume overload.  ------------------------------------------------------------------- Aortic valve:  Mechanical aortic valve with no significant regurgitation, unable to obtain mean gradient as transgastric windows were poor.  ------------------------------------------------------------------- Aorta:  Normal caliber ascending aorta.  ------------------------------------------------------------------- Mitral valve:  The mitral valve was calcified and thickened, especially the posterior leaflet that was restricted. Mean gradient 5 mmHg but MVA by PHT > 2 cm^2. I think there was only minimal mitral stenosis. There was severe mitral regurgitation, likely due to restriction of posterior leaflet and annular dilation from severe LAE (mixed functional and primary etiology). ERO 0.42 cm^2 by PISA.  Doppler:     Valve area by pressure half-time: 2.75 cm^2. Indexed valve area by pressure half-time: 1.05 cm^2/m^2.    Mean gradient (D): 5 mm Hg.  ------------------------------------------------------------------- Left atrium:  The atrium was severely dilated.  No evidence of thrombus in the atrial cavity or appendage.  ------------------------------------------------------------------- Atrial septum:  No defect or patent foramen ovale was identified.   ------------------------------------------------------------------- Pulmonary veins:  There was systolic flow reversal in the pulmonary vein doppler pattern.  ------------------------------------------------------------------- Right ventricle:  The cavity size was mildly dilated. Systolic function was mildly  reduced.  ------------------------------------------------------------------- Pulmonic valve:    Structurally normal valve.   Cusp separation was normal.  ------------------------------------------------------------------- Tricuspid valve:  Mild tricuspid regurgitation with peak RV-RA gradient 25 mmHg.  ------------------------------------------------------------------- Right atrium:  The atrium was moderately dilated.  ------------------------------------------------------------------- Pericardium:  There was no pericardial effusion.   ------------------------------------------------------------------- Post procedure conclusions Ascending Aorta:  - Normal caliber ascending aorta.  ------------------------------------------------------------------- Measurements   Mitral valve                              Value  Mitral mean velocity, D                   101   cm/s  Mitral pressure half-time                 80    ms  Mitral mean gradient, D                   5     mm Hg  Mitral valve area, PHT, DP  2.75  cm^2  Mitral valve area/bsa, PHT, DP            1.05  cm^2/m^2  Mitral annulus VTI, D                     33.8  cm  Mitral maximal regurg velocity, PISA      501   cm/s  Mitral regurg VTI, PISA                   143   cm  Mitral ERO, PISA                          0.42  cm^2  Mitral regurg volume, PISA                60    ml  Legend: (L)  and  (H)  mark values outside specified reference range.  ------------------------------------------------------------------- Prepared and Electronically Authenticated by  Loralie Champagne, M.D. 2019-07-24T14:52:20   STS Risk Calculator:  Procedure: Isolated MVR   Risk of Mortality:  25.859%   Renal Failure:  25.326%   Permanent Stroke:  5.922%   Prolonged Ventilation:  48.675%   DSW Infection:  0.877%   Reoperation:  9.717%   Morbidity or Mortality:  57.880%   Short Length of Stay:  4.512%   Long  Length of Stay:  40.170%   Procedure: MV Repair   Risk of Mortality:  20.198%   Renal Failure:  19.363%   Permanent Stroke:  4.768%   Prolonged Ventilation:  42.455%   DSW Infection:  0.459%   Reoperation:  9.564%   Morbidity or Mortality:  50.854%   Short Length of Stay:  6.228%   Long Length of Stay:  28.009%       Impression:  Patient has stage D severe symptomatic primary mitral regurgitation.  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  He has long-standing persistent atrial fibrillation on warfarin anticoagulation.   By report the patient was getting along fairly well with stable, relatively mild symptoms of chronic diastolic heart failure until recently, and he reportedly remained fairly active and functionally independent as recently as 6-12 months ago.  He now presents with fairly rapid decline over the last several months with acute exacerbation of chronic diastolic congestive heart failure and right heart failure.  He has been hospitalized twice in the last 2 months and at present he remains in acutely decompensated congestive heart failure with right heart failure and massive volume overload.  He does appear to be responding to diuretic therapy.  I have personally reviewed the patient's recent transthoracic and transesophageal echocardiograms.  He has normal a functioning bileaflet mechanical prosthetic valve in the aortic position with normal transvalvular gradient.  Left ventricular systolic function remains reasonably well preserved.  There is moderate thickening and mild calcification of the mitral valve with restricted leaflet mobility, particularly involving the posterior leaflet.  There is significant annular dilatation with severe left atrial enlargement.  There  is likely a combination of type I and type IIIA mitral valve dysfunction with severe mitral regurgitation.  The right ventricle is dilated and there is at least moderate right ventricular dysfunction.  There is surprisingly only mild tricuspid regurgitation.  I agree the patient would likely benefit  from mitral valve replacement.  However, risks associated with surgical intervention under the current circumstances would be prohibitive because of the patient's advanced age, previous cardiac surgery, severe right ventricular dysfunction with decompensated right heart failure, and numerous other comorbid conditions.  I agree that based upon review of the transesophageal echocardiogram percutaneous transcatheter mitral valve repair using mitral clip appears relatively unfavorable due to the rheumatic etiology and concerns regarding the likelihood of creating mitral stenosis.    Plan:  I discussed the nature of the patient's complex set of cardiac problems at length with the patient at the bedside this morning.  Alternative approaches such as high risk conventional mitral valve replacement, percutaneous edge-to-edge MitraClip repair, and continued medical therapy without intervention were compared and contrasted at length.  The extreme risks associated with mitral valve replacement under current circumstances were discussed in detail, including why I would be reluctant to consider this patient a candidate for conventional surgery.  Questions as to whether or not the patient might ultimately improve enough with advanced medical therapy to the point were elective surgery could be considered were reviewed.  If not, the possibility that the patient might prove to be a candidate for referral to a tertiary care center where experimental transcatheter mitral valve replacement is being performed were discussed.  All questions answered.  At present I agree with plans to proceed with aggressive medical therapy in an  effort to reverse the patient's acute exacerbation of chronic diastolic heart failure and right heart failure and see if his right ventricular function improves.  Ultimately he would need left and right heart catheterization performed if advanced therapy were to be considered.  In addition, he will need referral for dental consultation and dental extraction.   I spent in excess of 120 minutes during the conduct of this hospital consultation and >50% of this time involved direct face-to-face encounter for counseling and/or coordination of the patient's care.    Valentina Gu. [name], MD 10/26/2017 10:33 AM

## 2017-10-26 NOTE — Progress Notes (Signed)
ANTICOAGULATION CONSULT NOTE - Follow-up Consult  Pharmacy Consult for Coumadin Indication: mechanical aortic valve + Afib  Allergies  Allergen Reactions  . Rifampin Rash    May have been caused by Vancomycin or Rifampin (??)  . Vancomycin Rash    May have been caused by Vancomycin or Rifampin (??)    Patient Measurements: Height: 6\' 2"  (188 cm) Weight: 265 lb 4.8 oz (120.3 kg) IBW/kg (Calculated) : 82.2 Heparin Dosing Weight: n/a   Vital Signs: Temp: 98 F (36.7 C) (07/26 0739) Temp Source: Oral (07/26 0739) BP: 102/68 (07/26 0739) Pulse Rate: 84 (07/26 0739)  Labs: Recent Labs    10/23/17 2225 10/24/17 0418 10/25/17 0358 10/26/17 0500  HGB  --  9.4*  --  7.6*  HCT  --  30.9*  --  25.2*  PLT  --  99*  --  79*  LABPROT  --  27.1* 29.7* 31.3*  INR  --  2.53 2.85 3.05  HEPARINUNFRC 0.46  --   --   --   CREATININE  --  1.18 1.11 1.17    Estimated Creatinine Clearance: 69.4 mL/min (by C-G formula based on SCr of 1.17 mg/dL).   Medical History: Past Medical History:  Diagnosis Date  . Carotid artery disease ([place]) 1994   s/p left carotid endarerectomy   . Chronic atrial fibrillation (HCC)    a. on coumadin   . Chronic diastolic CHF (congestive heart failure) ([place])   . Hx of CABG    a. 1994  . Hypertension   . OSA (obstructive sleep apnea)   . Rheumatic fever   . S/P AVR (aortic valve replacement)    a. mechanical valve 1996  . Subclavian bypass stenosis ([place])   . Vitamin B12 deficiency    Assessment: 82 yo male on chronic Coumadin for mechanical aortic valve + afib. PTA Coumadin dose 5 mg daily except 2.5 mg on Mon, Fri.  INR increased from 2.85 to 2.05. Hgb 7.6 (down), plt 79 (chronically low). Meal intake 75-100%. Bleeding resolved from yesterday. Will continue with home dose of warfarin for now.   Goal of Therapy:  INR 2.5-3.5 Monitor platelets by anticoagulation protocol: Yes   Plan:  1. Warfarin 2.5 mg x 1 tonight 2. Daily PT/INR, check cbc in  am  Doylene Canard, PharmD Clinical Pharmacist  Pager: [phone] Phone: [phone] 10/26/2017 10:14 AM

## 2017-10-27 LAB — CBC WITH DIFFERENTIAL/PLATELET
Abs Immature Granulocytes: 0 10*3/uL (ref 0.0–0.1)
Basophils Absolute: 0 10*3/uL (ref 0.0–0.1)
Basophils Relative: 1 %
Eosinophils Absolute: 0.1 10*3/uL (ref 0.0–0.7)
Eosinophils Relative: 2 %
HEMATOCRIT: 25.8 % — AB (ref 39.0–52.0)
HEMOGLOBIN: 7.9 g/dL — AB (ref 13.0–17.0)
IMMATURE GRANULOCYTES: 0 %
LYMPHS ABS: 0.8 10*3/uL (ref 0.7–4.0)
LYMPHS PCT: 20 %
MCH: 33.2 pg (ref 26.0–34.0)
MCHC: 30.6 g/dL (ref 30.0–36.0)
MCV: 108.4 fL — ABNORMAL HIGH (ref 78.0–100.0)
Monocytes Absolute: 0.5 10*3/uL (ref 0.1–1.0)
Monocytes Relative: 12 %
NEUTROS PCT: 65 %
Neutro Abs: 2.6 10*3/uL (ref 1.7–7.7)
Platelets: 85 10*3/uL — ABNORMAL LOW (ref 150–400)
RBC: 2.38 MIL/uL — ABNORMAL LOW (ref 4.22–5.81)
RDW: 16.7 % — ABNORMAL HIGH (ref 11.5–15.5)
WBC: 4 10*3/uL (ref 4.0–10.5)

## 2017-10-27 LAB — GLUCOSE, CAPILLARY
GLUCOSE-CAPILLARY: 114 mg/dL — AB (ref 70–99)
GLUCOSE-CAPILLARY: 124 mg/dL — AB (ref 70–99)
Glucose-Capillary: 128 mg/dL — ABNORMAL HIGH (ref 70–99)
Glucose-Capillary: 139 mg/dL — ABNORMAL HIGH (ref 70–99)

## 2017-10-27 LAB — BASIC METABOLIC PANEL
Anion gap: 15 (ref 5–15)
BUN: 41 mg/dL — AB (ref 8–23)
CHLORIDE: 80 mmol/L — AB (ref 98–111)
CO2: 41 mmol/L — ABNORMAL HIGH (ref 22–32)
Calcium: 9.4 mg/dL (ref 8.9–10.3)
Creatinine, Ser: 1.23 mg/dL (ref 0.61–1.24)
GFR calc Af Amer: >60 mL/min (ref >60)
GFR, EST NON AFRICAN AMERICAN: 54 mL/min — AB (ref >60)
GLUCOSE: 187 mg/dL — AB (ref 70–99)
Potassium: 3.5 mmol/L (ref 3.5–5.1)
Sodium: 136 mmol/L (ref 135–145)

## 2017-10-27 LAB — PROTIME-INR
INR: 2.92
Prothrombin Time: 30.2 seconds — ABNORMAL HIGH (ref 11.4–15.2)

## 2017-10-27 LAB — MAGNESIUM: Magnesium: 1.9 mg/dL (ref 1.7–2.4)

## 2017-10-27 MED ORDER — WARFARIN SODIUM 5 MG PO TABS
5.0000 mg | ORAL_TABLET | Freq: Once | ORAL | Status: AC
  Administered 2017-10-27: 5 mg via ORAL
  Filled 2017-10-27: qty 1

## 2017-10-27 NOTE — Progress Notes (Signed)
Patient ID: [name], male   DOB: 1935-10-30, 82 y.o.   MRN: [id number]     Advanced Heart Failure Rounding Note  PCP-Cardiologist: Belva Crome III, MD   Subjective:    CVP 13.  -2.3 L yesterday.  Continuing to diurese well.  No shortness of breath.  Weight down to 260 pounds, he feels base weight is 250 pounds.  Objective:   Weight Range: 260 lb (117.9 kg) Body mass index is 33.38 kg/m.   Vital Signs:   Temp:  [97.9 F (36.6 C)-98.6 F (37 C)] 97.9 F (36.6 C) (07/27 0736) Pulse Rate:  [40-86] 67 (07/27 0736) Resp:  [19-21] 19 (07/27 0655) BP: (82-103)/(50-72) 88/64 (07/27 0736) SpO2:  [93 %-97 %] 97 % (07/27 0736) Weight:  [260 lb (117.9 kg)] 260 lb (117.9 kg) (07/27 0400) Last BM Date: 10/26/17  Weight change: Filed Weights   10/25/17 0128 10/26/17 0600 10/27/17 0400  Weight: 271 lb 4.8 oz (123.1 kg) 265 lb 4.8 oz (120.3 kg) 260 lb (117.9 kg)   Intake/Output:   Intake/Output Summary (Last 24 hours) at 10/27/2017 0853 Last data filed at 10/27/2017 0650 Gross per 24 hour  Intake 1288.2 ml  Output 3650 ml  Net -2361.8 ml    Physical Exam    GEN: Well nourished, well developed, in no acute distress  HEENT: normal  Neck: no JVD, carotid bruits, or masses Cardiac: iRRR; 2 out of 6 systolic murmur right upper sternal border, no rubs, or gallops, 2+ lower extremity edema Respiratory:  clear to auscultation bilaterally, normal work of breathing GI: soft, nontender, nondistended, + BS MS: no deformity or atrophy  Skin: warm and dry, right upper extremity PICC Neuro:  Strength and sensation are intact Psych: euthymic mood, full affect  Telemetry   Atrial fibrillation, occasional PVCs, personally reviewed  Labs    CBC Recent Labs    10/26/17 0500 10/27/17 0530  WBC 3.4* 4.0  NEUTROABS  --  2.6  HGB 7.6* 7.9*  HCT 25.2* 25.8*  MCV 109.6* 108.4*  PLT 79* 85*   Basic Metabolic Panel Recent Labs    10/26/17 0500 10/27/17 0530  NA 137 136  K 3.5 3.5   CL 82* 80*  CO2 41* 41*  GLUCOSE 175* 187*  BUN 42* 41*  CREATININE 1.17 1.23  CALCIUM 9.2 9.4  MG 2.0 1.9   Liver Function Tests No results for input(s): AST, ALT, ALKPHOS, BILITOT, PROT, ALBUMIN in the last 72 hours. No results for input(s): LIPASE, AMYLASE in the last 72 hours. Cardiac Enzymes No results for input(s): CKTOTAL, CKMB, CKMBINDEX, TROPONINI in the last 72 hours.  BNP: BNP (last 3 results) Recent Labs    08/01/17 1804 10/18/17 1510  BNP 784.4* 1,073.3*    ProBNP (last 3 results) Recent Labs    03/30/17 1147 05/25/17 1044 07/10/17 1523  PROBNP 1,858* 2,749* 411.0*     D-Dimer No results for input(s): DDIMER in the last 72 hours. Hemoglobin A1C No results for input(s): HGBA1C in the last 72 hours. Fasting Lipid Panel No results for input(s): CHOL, HDL, LDLCALC, TRIG, CHOLHDL, LDLDIRECT in the last 72 hours. Thyroid Function Tests No results for input(s): TSH, T4TOTAL, T3FREE, THYROIDAB in the last 72 hours.  Invalid input(s): FREET3  Other results:   Imaging    Dg Orthopantogram  Result Date: 10/26/2017 CLINICAL DATA:  Poor dentition. Pt states he is possibly having cardiac surgery to be scheduled. Pt denies all complaints to his mouth and teeth. EXAM: ORTHOPANTOGRAM/PANORAMIC COMPARISON:  Maxillofacial CT 08/22/2017 FINDINGS: Multiple missing teeth and dental restorations. Caries involves tooth 18. Periapical lucency involving tooth  30, possibly abscess. IMPRESSION: 1. Possible periapical abscess involving tooth  30. 2. Caries, tooth 18. Electronically Signed   By: Lucrezia Europe M.D.   On: 10/26/2017 15:30     Medications:     Scheduled Medications: . carvedilol  6.25 mg Oral BID WC  . docusate sodium  100 mg Oral BID  . ezetimibe-simvastatin  1 tablet Oral QHS  . folic acid  1 mg Oral Daily  . insulin aspart  0-15 Units Subcutaneous TID WC  . potassium chloride  60 mEq Oral BID  . sodium chloride flush  3 mL Intravenous Q12H  .  spironolactone  25 mg Oral Daily  . vitamin B-12  3,000 mcg Oral Daily  . Warfarin - Pharmacist Dosing Inpatient   Does not apply q1800    Infusions: . sodium chloride    . furosemide (LASIX) infusion 15 mg/hr (10/26/17 2338)    PRN Medications: sodium chloride, acetaminophen, fluocinonide cream, ipratropium-albuterol, ondansetron (ZOFRAN) IV, oxymetazoline, senna, sodium chloride flush, sodium chloride flush    Patient Profile   [name] is a 82 y.o. male with h/o mechanical AVR 1994, OSA on CPAP, Chronic afib, chronic combined CHF, CKD III, chronic coumadin therapy, CAD s/p CAGB 1994, and COPD.   Admitted from [facility] office 10/18/17 with Acute on chronic diastolic CHF.   Assessment/Plan   1. Acute on chronic diastolic CHF with prominent RV failure: Echo this admission shows EF 55 to 65%.  His mechanical have severe mitral regurgitation.  He is diuresing well.  We [name] continue Lasix drip and metolazone today.  His creatinine is mildly elevated compared to yesterday.  He may be reaching the end of his diuresis. 3. Mechanical aortic valve: Functioning appropriately by echo this admission.  Goal INR 2.5-3.5.   4. Atrial fibrillation: Chronic.  Currently on Coreg and warfarin.  Goal INR closer to 2.5. 5. CAD: s/p CABG.  No history of ischemia.  Continue Vytorin. 6. COPD:  On home oxygen.  No changes. 7. OSA:  Continue home CPAP. 8. CKD: Stage 3.   Stable 9. Constipation Resolved 10. Deconditioning Working with PT and OT.  Has been walking the halls.  Feels he is getting stronger.   11. Epistaxis Heparin stopped no further bleeding. 12. Anemia  Hemoglobin has stabilized.  Continue to follow.  Length of Stay: 9  Rajesh Wyss Meredith Leeds, MD  10/27/2017, 8:53 AM

## 2017-10-27 NOTE — Progress Notes (Signed)
ANTICOAGULATION CONSULT NOTE - Follow-up Consult  Pharmacy Consult for Coumadin Indication: mechanical aortic valve + Afib  Allergies  Allergen Reactions  . Rifampin Rash    May have been caused by Vancomycin or Rifampin (??)  . Vancomycin Rash    May have been caused by Vancomycin or Rifampin (??)    Patient Measurements: Height: 6\' 2"  (188 cm) Weight: 260 lb (117.9 kg) IBW/kg (Calculated) : 82.2 Heparin Dosing Weight: n/a   Vital Signs: Temp: 98.3 F (36.8 C) (07/27 1153) Temp Source: Oral (07/27 1153) BP: 107/60 (07/27 1153) Pulse Rate: 102 (07/27 1153)  Labs: Recent Labs    10/25/17 0358 10/26/17 0500 10/27/17 0530  HGB  --  7.6* 7.9*  HCT  --  25.2* 25.8*  PLT  --  79* 85*  LABPROT 29.7* 31.3* 30.2*  INR 2.85 3.05 2.92  CREATININE 1.11 1.17 1.23    Estimated Creatinine Clearance: 65.4 mL/min (by C-G formula based on SCr of 1.23 mg/dL).   Medical History: Past Medical History:  Diagnosis Date  . Carotid artery disease ([place]) 1994   s/p left carotid endarerectomy   . Chronic atrial fibrillation (HCC)    a. on coumadin   . Chronic diastolic CHF (congestive heart failure) ([place])   . Hx of CABG    a. 1994  . Hypertension   . OSA (obstructive sleep apnea)   . Rheumatic fever   . S/P AVR (aortic valve replacement)    a. mechanical valve 1996  . Subclavian bypass stenosis ([place])   . Vitamin B12 deficiency    Assessment: 82 yo male on chronic Coumadin for mechanical aortic valve + afib. PTA Coumadin dose 5 mg daily except 2.5 mg on Mon, Fri.  INR slightly decreased from 3.05 to 2.92. Hgb stable low at 7.9, plt 85 (chronically low). Meal intake 100%. No bleeding per nurse. Will continue home dose of warfarin for now.  Goal of Therapy:  INR 2.5-3.5 Monitor platelets by anticoagulation protocol: Yes   Plan:  1. Warfarin 5 mg x 1 tonight 2. Monitor daily PT/INR, CBC and s/s of bleeding   Gwenlyn Found, [place] D PGY1 Pharmacy Resident  Phone [phone] 10/27/2017   12:21 PM

## 2017-10-27 NOTE — Progress Notes (Signed)
RT NOTE:  Pt refuses CPAP tonight. He did wear last night but doesn't feel like wearing tonight. CPAP is setup @ bedside if patient changes mind.

## 2017-10-27 NOTE — Progress Notes (Signed)
Coreg was held for his SBP this shift.  Idolina Primer, RN

## 2017-10-28 DIAGNOSIS — Z515 Encounter for palliative care: Secondary | ICD-10-CM

## 2017-10-28 DIAGNOSIS — R0602 Shortness of breath: Secondary | ICD-10-CM

## 2017-10-28 LAB — CBC
HEMATOCRIT: 25.7 % — AB (ref 39.0–52.0)
Hemoglobin: 8 g/dL — ABNORMAL LOW (ref 13.0–17.0)
MCH: 34.2 pg — ABNORMAL HIGH (ref 26.0–34.0)
MCHC: 31.1 g/dL (ref 30.0–36.0)
MCV: 109.8 fL — ABNORMAL HIGH (ref 78.0–100.0)
PLATELETS: 86 10*3/uL — AB (ref 150–400)
RBC: 2.34 MIL/uL — ABNORMAL LOW (ref 4.22–5.81)
RDW: 16.8 % — AB (ref 11.5–15.5)
WBC: 4.3 10*3/uL (ref 4.0–10.5)

## 2017-10-28 LAB — BASIC METABOLIC PANEL
ANION GAP: 11 (ref 5–15)
BUN: 46 mg/dL — ABNORMAL HIGH (ref 8–23)
CO2: 44 mmol/L — ABNORMAL HIGH (ref 22–32)
Calcium: 9.3 mg/dL (ref 8.9–10.3)
Chloride: 81 mmol/L — ABNORMAL LOW (ref 98–111)
Creatinine, Ser: 1.48 mg/dL — ABNORMAL HIGH (ref 0.61–1.24)
GFR calc Af Amer: 50 mL/min — ABNORMAL LOW (ref >60)
GFR, EST NON AFRICAN AMERICAN: 43 mL/min — AB (ref >60)
GLUCOSE: 167 mg/dL — AB (ref 70–99)
POTASSIUM: 3.9 mmol/L (ref 3.5–5.1)
Sodium: 136 mmol/L (ref 135–145)

## 2017-10-28 LAB — PROTIME-INR
INR: 2.65
PROTHROMBIN TIME: 28 s — AB (ref 11.4–15.2)

## 2017-10-28 LAB — GLUCOSE, CAPILLARY
GLUCOSE-CAPILLARY: 125 mg/dL — AB (ref 70–99)
GLUCOSE-CAPILLARY: 113 mg/dL — AB (ref 70–99)
GLUCOSE-CAPILLARY: 100 mg/dL — AB (ref 70–99)
Glucose-Capillary: 141 mg/dL — ABNORMAL HIGH (ref 70–99)

## 2017-10-28 LAB — MAGNESIUM: Magnesium: 2.2 mg/dL (ref 1.7–2.4)

## 2017-10-28 MED ORDER — POTASSIUM CHLORIDE 20 MEQ/15ML (10%) PO SOLN
20.0000 meq | Freq: Two times a day (BID) | ORAL | Status: DC
  Administered 2017-10-28 – 2017-10-29 (×3): 20 meq via ORAL
  Filled 2017-10-28 (×3): qty 15

## 2017-10-28 MED ORDER — WARFARIN SODIUM 5 MG PO TABS
5.0000 mg | ORAL_TABLET | Freq: Once | ORAL | Status: AC
  Administered 2017-10-28: 5 mg via ORAL
  Filled 2017-10-28: qty 1

## 2017-10-28 NOTE — Progress Notes (Addendum)
Patient ID: [name], male   DOB: 09-Nov-1935, 82 y.o.   MRN: [id number]     Advanced Heart Failure Rounding Note  PCP-Cardiologist: [name], MD   Subjective:    Continues to diurese with IV lasix gtt. Weight down another 4 pounds (43 pounds total)   Breathing better. Creatinine bumped 1.2->1.5. Denies SOB, orthopnea or PND. No dizziness CVP 11.    TEE (7/23): showed EF 55-60%, mildly dilated/dysfunctional RV, severe MR likely mixed primary and secondary etiology with minimal mitral stenosis.  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   Objective:   Weight Range: 116.3 kg (256 lb 6.4 oz) Body mass index is 32.92 kg/m.   Vital Signs:   Temp:  [97.8 F (36.6 C)-99.1 F (37.3 C)] 98.5 F (36.9 C) (07/28 0738) Pulse Rate:  [65-102] 68 (07/28 0738) Resp:  [16-20] 20 (07/28 0249) BP: (85-107)/(53-64) 88/55 (07/28 0738) SpO2:  [92 %-96 %] 95 % (07/28 0738) Weight:  [116.3 kg (256 lb 6.4 oz)] 116.3 kg (256 lb 6.4 oz) (07/28 0249) Last BM Date: 10/26/17  Weight change: Filed Weights   10/26/17 0600 10/27/17 0400 10/28/17 0249  Weight: 120.3 kg (265 lb 4.8 oz) 117.9 kg (260 lb) 116.3 kg (256 lb 6.4 oz)   Intake/Output:   Intake/Output Summary (Last 24 hours) at 10/28/2017 0923 Last data filed at 10/28/2017 0600 Gross per 24 hour  Intake 1020 ml  Output 1350 ml  Net -330 ml    Physical Exam    General: Sitting on bedside commode. NAD HEENT: Normal  Neck: Supple. JVP ~11 cm. Carotids 2+ bilat; no bruits. No thyromegaly or nodule noted. Cor: PMI nondisplaced. IRR. Mechanical s2 2/6 SEM ar RUSB I do not hear MR well  Lungs: clear  Abdomen: soft, nontender, nondistended. No hepatosplenomegaly. No bruits or masses. Good bowel sounds. Extremities: no cyanosis, clubbing, rash, LE wrapped. Trace -1+ edema RUE picc multiple ecchymosis  Neuro: alert & oriented x 3, cranial nerves grossly intact. moves all 4 extremities w/o difficulty. Affect pleasant   Telemetry    Afib 60-80s with occasional PVCs, personally reviewed.   Labs    CBC Recent Labs    10/27/17 0530 10/28/17 0500  WBC 4.0 4.3  NEUTROABS 2.6  --   HGB 7.9* 8.0*  HCT 25.8* 25.7*  MCV 108.4* 109.8*  PLT 85* 86*   Basic Metabolic Panel Recent Labs    10/27/17 0530 10/28/17 0500  NA 136 136  K 3.5 3.9  CL 80* 81*  CO2 41* 44*  GLUCOSE 187* 167*  BUN 41* 46*  CREATININE 1.23 1.48*  CALCIUM 9.4 9.3  MG 1.9 2.2   Liver Function Tests No results for input(s): AST, ALT, ALKPHOS, BILITOT, PROT, ALBUMIN in the last 72 hours. No results for input(s): LIPASE, AMYLASE in the last 72 hours. Cardiac Enzymes No results for input(s): CKTOTAL, CKMB, CKMBINDEX, TROPONINI in the last 72 hours.  BNP: BNP (last 3 results) Recent Labs    08/01/17 1804 10/18/17 1510  BNP 784.4* 1,073.3*    ProBNP (last 3 results) Recent Labs    03/30/17 1147 05/25/17 1044 07/10/17 1523  PROBNP 1,858* 2,749* 411.0*     D-Dimer No results for input(s): DDIMER in the last 72 hours. Hemoglobin A1C No results for input(s): HGBA1C in the last 72 hours. Fasting Lipid Panel No results for input(s): CHOL, HDL, LDLCALC, TRIG, CHOLHDL, LDLDIRECT in the last 72 hours. Thyroid Function Tests No results for input(s): TSH, T4TOTAL, T3FREE, THYROIDAB in the last  72 hours.  Invalid input(s): FREET3  Other results:   Imaging    No results found.   Medications:     Scheduled Medications: . carvedilol  6.25 mg Oral BID WC  . docusate sodium  100 mg Oral BID  . ezetimibe-simvastatin  1 tablet Oral QHS  . folic acid  1 mg Oral Daily  . insulin aspart  0-15 Units Subcutaneous TID WC  . potassium chloride  60 mEq Oral BID  . sodium chloride flush  3 mL Intravenous Q12H  . spironolactone  25 mg Oral Daily  . vitamin B-12  3,000 mcg Oral Daily  . Warfarin - Pharmacist Dosing Inpatient   Does not apply q1800    Infusions: . sodium chloride    . furosemide (LASIX) infusion 15 mg/hr  (10/27/17 1417)    PRN Medications: sodium chloride, acetaminophen, fluocinonide cream, ipratropium-albuterol, ondansetron (ZOFRAN) IV, oxymetazoline, senna, sodium chloride flush, sodium chloride flush    Patient Profile   [name] is a 82 y.o. male with h/o mechanical AVR 1994, OSA on CPAP, Chronic afib, chronic combined CHF, CKD III, chronic coumadin therapy, CAD s/p CAGB 1994, and COPD.   Admitted from [facility] office 10/18/17 with Acute on chronic diastolic CHF.   Assessment/Plan   1. Acute on chronic diastolic CHF with prominent RV failure: - Echo this admission showed EF 55-65%, mechanical aortic valve functioning normally, mild mitral stenosis/severe mitral regurgitation, RV moderately dilated with moderately decreased systolic function, PASP 49 mmHg.  Severe MR could be contributing to the significant RV failure.  - Brisk diuresis noted again. Overall weight down 43 pounds.  - CVP 11 but creatinine bumping. Suspect this is as good as we can get him for now. Stop lasix gtt.  - Continue spironolactone 25 mg daily.  - Having trouble with KCL pills - will switch to elixir. Was not on kcl at home so will drop to 20 bid for now with K 3.9. Can stop tomorrow as needed.  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 It does not appear that he will be a Mitraclip candidate. Hs been seen by Dr. [name] who recommended aggressive medical therapy versus possible experimental percutaneous MVR as he felt very high risk for conventional surgery  3. Mechanical aortic valve: Appears to function well on echo this admission.  - Continue warfarin with INR goal 2.5-3.5. - INR 2.6 Discussed dosing with PharmD personally. 4. Atrial fibrillation: Chronic.  - Rate controlled. Continue carvedilol 6.25 mg BID - INR 2.65  Continue warfarin. Keep INR closer 2.5 with nose bleed. Discussed  dosing with PharmD personally. 5. CAD: s/p CABG.  - No s/s of ischemia - Continue Vytorin.  6. COPD:  - On home oxygen. No change.  7. OSA:  - Continue home CPAP. No change.    8. Acute on CKD: Stage 3.   - Creatinine up. Hold lasix today 9. Constipation - Resolved. No change.  10. Deconditioning - He will need HH PT/OT and 24 hour supervision. No change.  11. Epistaxis - Resolved 12. Anemia  - Hgb up to 8.0 today, - Continue to follow.   Length of Stay: [place], MD  10/28/2017, 9:23 AM  Advanced Heart Failure Team Pager [phone] (M-F; 7a - 4p)  Please contact [facility] for night-coverage after hours (4p -7a ) and weekends on [email or web address]

## 2017-10-28 NOTE — Progress Notes (Signed)
ANTICOAGULATION CONSULT NOTE - Follow-up Consult  Pharmacy Consult for Coumadin Indication: mechanical aortic valve + Afib  Allergies  Allergen Reactions  . Rifampin Rash    May have been caused by Vancomycin or Rifampin (??)  . Vancomycin Rash    May have been caused by Vancomycin or Rifampin (??)    Patient Measurements: Height: 6\' 2"  (188 cm) Weight: 256 lb 6.4 oz (116.3 kg) IBW/kg (Calculated) : 82.2 Heparin Dosing Weight: n/a   Vital Signs: Temp: 98.1 F (36.7 C) (07/28 1122) Temp Source: Oral (07/28 1122) BP: 90/66 (07/28 1122) Pulse Rate: 43 (07/28 1122)  Labs: Recent Labs    10/26/17 0500 10/27/17 0530 10/28/17 0500  HGB 7.6* 7.9* 8.0*  HCT 25.2* 25.8* 25.7*  PLT 79* 85* 86*  LABPROT 31.3* 30.2* 28.0*  INR 3.05 2.92 2.65  CREATININE 1.17 1.23 1.48*    Estimated Creatinine Clearance: 53.9 mL/min (A) (by C-G formula based on SCr of 1.48 mg/dL (H)).   Medical History: Past Medical History:  Diagnosis Date  . Carotid artery disease ([place]) 1994   s/p left carotid endarerectomy   . Chronic atrial fibrillation (HCC)    a. on coumadin   . Chronic diastolic CHF (congestive heart failure) ([place])   . Hx of CABG    a. 1994  . Hypertension   . OSA (obstructive sleep apnea)   . Rheumatic fever   . S/P AVR (aortic valve replacement)    a. mechanical valve 1996  . Subclavian bypass stenosis ([place])   . Vitamin B12 deficiency    Assessment: 82 yo male on chronic Coumadin for mechanical aortic valve + afib. PTA Coumadin dose 5 mg daily except 2.5 mg on Mon, Fri.  INR therapeutic at 2.6. Hgb stable low at 8, plt 86 (chronically low). Meal intake 100%. Nose bleed noted, will target INR goal closer to 2.5 per MD. Will continue home dose of warfarin for now.  Goal of Therapy:  INR 2.5-3.5 Monitor platelets by anticoagulation protocol: Yes   Plan:  1. Warfarin 5 mg x 1 tonight 2. Monitor daily PT/INR, CBC and s/s of bleeding   Gwenlyn Found, [place] D PGY1 Pharmacy  Resident  Phone [phone] 10/28/2017   12:42 PM

## 2017-10-29 ENCOUNTER — Encounter (HOSPITAL_COMMUNITY): Payer: Self-pay | Admitting: Dentistry

## 2017-10-29 DIAGNOSIS — K045 Chronic apical periodontitis: Secondary | ICD-10-CM

## 2017-10-29 DIAGNOSIS — K0530 Chronic periodontitis, unspecified: Secondary | ICD-10-CM

## 2017-10-29 DIAGNOSIS — K036 Deposits [accretions] on teeth: Secondary | ICD-10-CM

## 2017-10-29 DIAGNOSIS — K08409 Partial loss of teeth, unspecified cause, unspecified class: Secondary | ICD-10-CM

## 2017-10-29 DIAGNOSIS — I340 Nonrheumatic mitral (valve) insufficiency: Secondary | ICD-10-CM

## 2017-10-29 DIAGNOSIS — L8990 Pressure ulcer of unspecified site, unspecified stage: Secondary | ICD-10-CM

## 2017-10-29 DIAGNOSIS — K029 Dental caries, unspecified: Secondary | ICD-10-CM

## 2017-10-29 LAB — CBC
HEMATOCRIT: 26.4 % — AB (ref 39.0–52.0)
Hemoglobin: 8.1 g/dL — ABNORMAL LOW (ref 13.0–17.0)
MCH: 34 pg (ref 26.0–34.0)
MCHC: 30.7 g/dL (ref 30.0–36.0)
MCV: 110.9 fL — AB (ref 78.0–100.0)
PLATELETS: 86 10*3/uL — AB (ref 150–400)
RBC: 2.38 MIL/uL — AB (ref 4.22–5.81)
RDW: 16.8 % — AB (ref 11.5–15.5)
WBC: 3.7 10*3/uL — ABNORMAL LOW (ref 4.0–10.5)

## 2017-10-29 LAB — BASIC METABOLIC PANEL
Anion gap: 12 (ref 5–15)
Anion gap: 9 (ref 5–15)
BUN: 45 mg/dL — AB (ref 8–23)
BUN: 44 mg/dL — ABNORMAL HIGH (ref 8–23)
CHLORIDE: 83 mmol/L — AB (ref 98–111)
CO2: 46 mmol/L — ABNORMAL HIGH (ref 22–32)
CO2: 43 mmol/L — ABNORMAL HIGH (ref 22–32)
CREATININE: 1.38 mg/dL — AB (ref 0.61–1.24)
Calcium: 9.6 mg/dL (ref 8.9–10.3)
Calcium: 9.6 mg/dL (ref 8.9–10.3)
Chloride: 82 mmol/L — ABNORMAL LOW (ref 98–111)
Creatinine, Ser: 1.3 mg/dL — ABNORMAL HIGH (ref 0.61–1.24)
GFR calc Af Amer: 54 mL/min — ABNORMAL LOW (ref >60)
GFR calc non Af Amer: 50 mL/min — ABNORMAL LOW (ref >60)
GFR, EST AFRICAN AMERICAN: 58 mL/min — AB (ref >60)
GFR, EST NON AFRICAN AMERICAN: 47 mL/min — AB (ref >60)
GLUCOSE: 114 mg/dL — AB (ref 70–99)
Glucose, Bld: 156 mg/dL — ABNORMAL HIGH (ref 70–99)
POTASSIUM: 3.8 mmol/L (ref 3.5–5.1)
Potassium: 4.1 mmol/L (ref 3.5–5.1)
SODIUM: 140 mmol/L (ref 135–145)
SODIUM: 135 mmol/L (ref 135–145)

## 2017-10-29 LAB — PROTIME-INR
INR: 2.46
Prothrombin Time: 26.4 seconds — ABNORMAL HIGH (ref 11.4–15.2)

## 2017-10-29 LAB — GLUCOSE, CAPILLARY
GLUCOSE-CAPILLARY: 148 mg/dL — AB (ref 70–99)
GLUCOSE-CAPILLARY: 98 mg/dL (ref 70–99)
Glucose-Capillary: 124 mg/dL — ABNORMAL HIGH (ref 70–99)
Glucose-Capillary: 121 mg/dL — ABNORMAL HIGH (ref 70–99)

## 2017-10-29 LAB — MAGNESIUM: MAGNESIUM: 2.2 mg/dL (ref 1.7–2.4)

## 2017-10-29 MED ORDER — METOLAZONE 5 MG PO TABS
2.5000 mg | ORAL_TABLET | Freq: Once | ORAL | Status: AC
  Administered 2017-10-29: 2.5 mg via ORAL
  Filled 2017-10-29: qty 1

## 2017-10-29 MED ORDER — FUROSEMIDE 10 MG/ML IJ SOLN
15.0000 mg/h | INTRAVENOUS | Status: DC
  Administered 2017-10-29 – 2017-11-01 (×6): 15 mg/h via INTRAVENOUS
  Filled 2017-10-29 (×3): qty 25
  Filled 2017-10-29: qty 21
  Filled 2017-10-29 (×3): qty 25
  Filled 2017-10-29: qty 21

## 2017-10-29 MED ORDER — ACETAZOLAMIDE 250 MG PO TABS
250.0000 mg | ORAL_TABLET | Freq: Two times a day (BID) | ORAL | Status: AC
  Administered 2017-10-29 (×2): 250 mg via ORAL
  Filled 2017-10-29 (×2): qty 1

## 2017-10-29 MED ORDER — POTASSIUM CHLORIDE CRYS ER 20 MEQ PO TBCR
40.0000 meq | EXTENDED_RELEASE_TABLET | Freq: Once | ORAL | Status: AC
  Administered 2017-10-29: 40 meq via ORAL
  Filled 2017-10-29: qty 2

## 2017-10-29 MED ORDER — WARFARIN SODIUM 2 MG PO TABS
3.0000 mg | ORAL_TABLET | Freq: Once | ORAL | Status: AC
  Administered 2017-10-29: 3 mg via ORAL
  Filled 2017-10-29: qty 1

## 2017-10-29 MED ORDER — POTASSIUM CHLORIDE CRYS ER 20 MEQ PO TBCR
40.0000 meq | EXTENDED_RELEASE_TABLET | Freq: Once | ORAL | Status: AC
Start: 2017-10-29 — End: 2017-10-29
  Administered 2017-10-29: 40 meq via ORAL
  Filled 2017-10-29: qty 2

## 2017-10-29 NOTE — Progress Notes (Addendum)
Patient ID: [name], male   DOB: 1935-12-08, 82 y.o.   MRN: [id number]     Advanced Heart Failure Rounding Note  PCP-Cardiologist: Belva Crome III, MD   Subjective:    Weight down another 3 pounds (46 pounds total). Now off lasix drip with creatinine bump yesterday.   Creatinine 1.2->1.5 ->1.38. CVP 25. CO2 46  Denies CP,SOB, or bleeding. He is orthopneic this morning.   TEE (7/23): showed EF 55-60%, mildly dilated/dysfunctional RV, severe MR likely mixed primary and secondary etiology with minimal mitral stenosis.  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   Objective:   Weight Range: 253 lb 6.4 oz (114.9 kg) Body mass index is 32.53 kg/m.   Vital Signs:   Temp:  [97.5 F (36.4 C)-98.7 F (37.1 C)] 97.5 F (36.4 C) (07/29 0734) Pulse Rate:  [43-92] 72 (07/29 0734) BP: (82-94)/(56-66) 85/56 (07/29 0734) SpO2:  [96 %-100 %] 98 % (07/29 0734) Weight:  [253 lb 6.4 oz (114.9 kg)] 253 lb 6.4 oz (114.9 kg) (07/29 0450) Last BM Date: 10/28/17  Weight change: Filed Weights   10/27/17 0400 10/28/17 0249 10/29/17 0450  Weight: 260 lb (117.9 kg) 256 lb 6.4 oz (116.3 kg) 253 lb 6.4 oz (114.9 kg)   Intake/Output:   Intake/Output Summary (Last 24 hours) at 10/29/2017 0908 Last data filed at 10/29/2017 0836 Gross per 24 hour  Intake 290.08 ml  Output 1725 ml  Net -1434.92 ml    Physical Exam    CVP 25 General:  No resp difficulty. HEENT: Normal Neck: Supple. JVP ~12. Carotids 2+ bilat; no bruits. No thyromegaly or nodule noted. Cor: PMI nondisplaced. IRR, Mechanical S2, 2/6 SEM at RUSB Lungs: CTAB, normal effort. Abdomen: Soft, non-tender, non-distended, no HSM. No bruits or masses. +BS  Extremities: No cyanosis, clubbing, or rash. R and LLE 1+ edema RUE PICC line.  Neuro: Alert & orientedx3, cranial nerves grossly intact. moves all 4 extremities w/o difficulty. Affect pleasant   Telemetry   Afib 80s Personally reviewed.   Labs    CBC Recent Labs     10/27/17 0530 10/28/17 0500 10/29/17 0500  WBC 4.0 4.3 3.7*  NEUTROABS 2.6  --   --   HGB 7.9* 8.0* 8.1*  HCT 25.8* 25.7* 26.4*  MCV 108.4* 109.8* 110.9*  PLT 85* 86* 86*   Basic Metabolic Panel Recent Labs    10/28/17 0500 10/29/17 0500  NA 136 140  K 3.9 4.1  CL 81* 82*  CO2 44* 46*  GLUCOSE 167* 114*  BUN 46* 45*  CREATININE 1.48* 1.38*  CALCIUM 9.3 9.6  MG 2.2 2.2   Liver Function Tests No results for input(s): AST, ALT, ALKPHOS, BILITOT, PROT, ALBUMIN in the last 72 hours. No results for input(s): LIPASE, AMYLASE in the last 72 hours. Cardiac Enzymes No results for input(s): CKTOTAL, CKMB, CKMBINDEX, TROPONINI in the last 72 hours.  BNP: BNP (last 3 results) Recent Labs    08/01/17 1804 10/18/17 1510  BNP 784.4* 1,073.3*    ProBNP (last 3 results) Recent Labs    03/30/17 1147 05/25/17 1044 07/10/17 1523  PROBNP 1,858* 2,749* 411.0*     D-Dimer No results for input(s): DDIMER in the last 72 hours. Hemoglobin A1C No results for input(s): HGBA1C in the last 72 hours. Fasting Lipid Panel No results for input(s): CHOL, HDL, LDLCALC, TRIG, CHOLHDL, LDLDIRECT in the last 72 hours. Thyroid Function Tests No results for input(s): TSH, T4TOTAL, T3FREE, THYROIDAB in the last 72 hours.  Invalid input(s): FREET3  Other results:   Imaging    No results found.   Medications:     Scheduled Medications: . carvedilol  6.25 mg Oral BID WC  . docusate sodium  100 mg Oral BID  . ezetimibe-simvastatin  1 tablet Oral QHS  . folic acid  1 mg Oral Daily  . insulin aspart  0-15 Units Subcutaneous TID WC  . potassium chloride  20 mEq Oral BID  . sodium chloride flush  3 mL Intravenous Q12H  . spironolactone  25 mg Oral Daily  . vitamin B-12  3,000 mcg Oral Daily  . Warfarin - Pharmacist Dosing Inpatient   Does not apply q1800    Infusions: . sodium chloride      PRN Medications: sodium chloride, acetaminophen, fluocinonide cream,  ipratropium-albuterol, ondansetron (ZOFRAN) IV, oxymetazoline, senna, sodium chloride flush, sodium chloride flush    Patient Profile   [name] is a 82 y.o. male with h/o mechanical AVR 1994, OSA on CPAP, Chronic afib, chronic combined CHF, CKD III, chronic coumadin therapy, CAD s/p CAGB 1994, and COPD.   Admitted from [facility] office 10/18/17 with Acute on chronic diastolic CHF.   Assessment/Plan   1. Acute on chronic diastolic CHF with prominent RV failure: - Echo this admission showed EF 55-65%, mechanical aortic valve functioning normally, mild mitral stenosis/severe mitral regurgitation, RV moderately dilated with moderately decreased systolic function, PASP 49 mmHg.  Severe MR could be contributing to the significant RV failure.  - Overall weight down 46 pounds. Volume status still looks elevated.  - CVP 25. Lasix drip stopped yesterday. Creatinine improved 1.38. - Restart lasix drip at 15 mg/hr. Start diamox 250 BID. CO2 46. Metolazone 2.5 mg x1 today.  - Continue spironolactone 25 mg daily.  - Continue potassium 20 meq BID for now.  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 It does not appear that he will be a Mitraclip candidate. Hs been seen by Dr. [name] who recommended aggressive medical therapy versus possible experimental percutaneous MVR as he felt very high risk for conventional surgery  - Continue CVP and coox monitoring.  3. Mechanical aortic valve: Appears to function well on echo this admission.  - Continue warfarin with INR goal 2.5-3.5. - INR 2.46 Discussed dosing with PharmD personally. 4. Atrial fibrillation: Chronic.  - Rate controlled 70-80s. Continue carvedilol 6.25 mg BID - INR 2.46 Continue warfarin. Keep INR closer 2.5 with nose bleed. Discussed dosing with PharmD personally. 5. CAD: s/p CABG.  - No s/s ischemia - Continue Vytorin.   6. COPD:  - On home oxygen. No change.  7. OSA:  - Continue home CPAP. No change.    8. Acute on CKD: Stage 3.   - Lasix drip stopped yesterday. Creatinine improving 1.38 > 1.48 9. Constipation - Resolved. No change.  10. Deconditioning - He will need HH PT/OT and 24 hour supervision. No change.  11. Epistaxis - Resolved 12. Anemia  - Hgb up to 8.1 today.  - Continue to follow.  13. Thrombocytopenia: This appears chronic, had before this admission.   Length of Stay: [address], NP  10/29/2017, 9:08 AM  Advanced Heart Failure Team Pager [phone] (M-F; 7a - 4p)  Please contact [facility] for night-coverage after hours (4p -7a ) and weekends on [email or web address]  Patient seen with NP, agree with the above note.  Lasix held yesterday with  CVP 11 and rise in creatinine to 1.5.  Today, creatinine 1.38 with CVP > 20 (rechecked by me as well).  On exam, he does appear volume overloaded with JVP 16+ cm and peripheral edema. However, his weight is down 46 lbs.   - We will need to restart diuresis today, will use Lasix 15 mg/hr + metolazone 2.5 x 1 + acetazolamide 250 mg bid with rise in HCO3. Follow BUN/creatinine closely.    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 Mitral stenosis is minimal. We sent off his TEE for Mitraclip evaluation, but it appears his MVA may be too small for successful clip (<3.0 cm^2). If he cannot get Mitraclip, options will be ongoing medical management versus MV surgery versus transcatheter MVR within clinical trial. Dr. [name] has seen, surgical risk very high and he would be reluctant to operate in this situation.  I think at this point it may be best to medically optimize him and then look for a trial site for transcatheter MVR.    He has not been out of bed this weekend.  Need to mobilize with PT/cardiac rehab/nursing.   Loralie Champagne 10/29/2017 9:48 AM

## 2017-10-29 NOTE — Progress Notes (Signed)
Patient refused CPAP tonight. There Is a machine in the room at this time. RN aware. Explained to Patient that if they changed their mind, to just have the RN call Respiratory and we would come set them up. 

## 2017-10-29 NOTE — Care Management Note (Addendum)
Case Management Note  Patient Details  Name: [name] MRN: [id number] Date of Birth: Dec 05, 1935  Subjective/Objective:  Pt presented as a transfer from [facility]. Heart Failure continues on IV Lasix Gtt. PTA from home with the support of wife. Pt has DME 02 at home. Previous CM did set the patient up with [facility] in regards to DME 3n1 and Bariatric RW. CM received call fromf [facility] that pt's weight does not qualify for Bariatric Rollator. CM asked [facility] to make patient aware.                 Action/Plan: Referral had been given to [facility] for [facility] RN/PT Services. SOC to begin within 24-48 hours post transition home. No further needs from CM at this time.   Expected Discharge Date:                  Expected Discharge Plan:  [place]  In-House Referral:  NA  Discharge planning Services  CM Consult  Post Acute Care Choice:  Durable Medical Equipment, Home Health Choice offered to:  Patient  DME Arranged:  [place] rolling with seat, 3-N-1 DME Agency:  [place]:  RN, Disease Management, PT [place] Agency:  [place]  Status of Service:  Completed, signed off  If discussed at [place] of Stay Meetings, dates discussed:    Additional Comments: 1602 11-01-17 Jacqlyn Krauss, RN BSN [phone] CM did discuss patient in the [place]- pt may qualify for LTAC. CM will continue to monitor for additional needs.  Bethena Roys, RN 10/29/2017, 1:42 PM

## 2017-10-29 NOTE — Consult Note (Signed)
DENTAL CONSULTATION  Date of Consultation:  10/29/2017 Patient Name:   [name] Date of Birth:   04-23-1935 Medical Record Number: [id number]  VITALS: BP (!) 85/56 (BP Location: Left Arm)   Pulse 72   Temp (!) 97.5 F (36.4 C) (Oral)   Resp 20   Ht 6\' 2"  (1.88 m)   Wt 253 lb 6.4 oz (114.9 kg)   SpO2 98%   BMI 32.53 kg/m   CHIEF COMPLAINT: The patient was referred by Dr. [name] for a dental consultation.  HPI: [name] is an 82 year old male recently diagnosed with severe mitral regurgitation.  Patient also has a medical history significant for history of mechanical aortic valve replacement with chronic anticoagulation, acute on chronic diastolic heart failure, obstructive sleep apnea, diabetes mellitus, and thrombocytopenia.  Patient referred for evaluation of poor dentition prior to possible mitral valve replacement.  The patient currently denies acute toothaches, swellings, or abscesses.  Patient has not seen a dentist in over 15 years by report.  Patient had an exam and cleaning at that time.  Patient does not seek regular dental care due to loss of dental insurance by report.  Patient denies having partial dentures.  Patient denies having dental phobia.    PROBLEM LIST: Patient Active Problem List   Diagnosis Date Noted  . Pressure injury of skin 10/29/2017  . Shortness of breath   . Palliative care by specialist   . Acute on chronic diastolic CHF (congestive heart failure) ([place])   . RVF (right ventricular failure) ([place])   . Right heart failure ([place]) 10/18/2017  . Acute on chronic combined systolic and diastolic ACC/AHA stage C congestive heart failure ([place]) 10/18/2017  . Hypoxia   . COPD exacerbation ([place]) 08/01/2017  . Pancytopenia ([place]) 08/01/2017  . Epistaxis 07/11/2017  . Actinic keratoses 04/05/2017  . Leg wound, right 02/18/2016  . Edema 02/18/2016  . Long term current use of anticoagulant therapy 02/18/2016  . Hyperkalemia 07/25/2015  . Atherosclerosis of  native arteries of extremity with intermittent claudication ([place]) 10/21/2014  . Thrombocytopenia ([place]) 10/20/2014  . Macrocytosis without anemia 10/20/2014  . OSA (obstructive sleep apnea) 05/15/2013  . Goals of care, counseling/discussion 05/01/2013  . History of mechanical aortic valve replacement 01/02/2013  . Obesity (BMI 30-39.9) 10/20/2012  . Type II diabetes mellitus with complication ([place]) 76/72/0947  . Acute asthmatic bronchitis 01/18/2012  . ACUTE KIDNEY FAILURE UNSPECIFIED 02/04/2009  . CUTANEOUS ERUPTIONS, DRUG-INDUCED 02/04/2009  . HLD (hyperlipidemia) 02/03/2009  . Essential hypertension 02/03/2009  . Coronary atherosclerosis 02/03/2009  . Atrial fibrillation ([place]) 02/03/2009  . Chronic diastolic heart failure ([place]) 02/03/2009  . CAROTID ENDARTERECTOMY, LEFT, HX OF 02/03/2009  . INF&INFLAM REACT DUE CARD DEVICE IMPLANT&GRAFT 12/30/2008    PMH: Past Medical History:  Diagnosis Date  . Carotid artery disease ([place]) 1994   s/p left carotid endarerectomy   . Chronic atrial fibrillation (H[facility])    a. on coumadin   . Chronic diastolic CHF (congestive heart failure) ([place])   . Hx of CABG    a. 1994  . Hypertension   . OSA (obstructive sleep apnea)   . Rheumatic fever   . S/P AVR (aortic valve replacement)    a. mechanical valve 1996  . Subclavian bypass stenosis ([place])   . Vitamin B12 deficiency     PSH: Past Surgical History:  Procedure Laterality Date  . [place]   replaced due to aortic stenosis, [employer] mechanical prostesis  . CAROTID ENDARTERECTOMY Left 1997  subclavian bypass Done in [place]  . CORONARY ARTERY BYPASS GRAFT  1994   w SVG to RCA and SVG to circumflex  . heart bypass     Done in [place]  . TEE WITHOUT CARDIOVERSION N/A 10/23/2017   Procedure: TRANSESOPHAGEAL ECHOCARDIOGRAM (TEE);  Surgeon: Larey Dresser, MD;  Location: [facility] ENDOSCOPY;  Service: Cardiovascular;  Laterality: N/A;    ALLERGIES: Allergies  Allergen  Reactions  . Rifampin Rash    May have been caused by Vancomycin or Rifampin (??)  . Vancomycin Rash    May have been caused by Vancomycin or Rifampin (??)    MEDICATIONS: Current Facility-Administered Medications  Medication Dose Route Frequency Provider Last Rate Last Dose  . 0.9 %  sodium chloride infusion  250 mL Intravenous PRN Georgiana Shore, NP      . acetaminophen (TYLENOL) tablet 650 mg  650 mg Oral Q4H PRN Georgiana Shore, NP      . acetaZOLAMIDE (DIAMOX) tablet 250 mg  250 mg Oral BID Georgiana Shore, NP   250 mg at 10/29/17 1049  . carvedilol (COREG) tablet 6.25 mg  6.25 mg Oral BID WC Ledora Bottcher, PA   6.25 mg at 10/28/17 1627  . docusate sodium (COLACE) capsule 100 mg  100 mg Oral BID Georgiana Shore, NP   100 mg at 10/29/17 1025  . ezetimibe-simvastatin (VYTORIN) 10-20 MG per tablet 1 tablet  1 tablet Oral QHS Georgiana Shore, NP   1 tablet at 10/28/17 2025  . fluocinonide cream (LIDEX) 8.52 % 1 application  1 application Topical BID PRN Georgiana Shore, NP      . folic acid (FOLVITE) tablet 1 mg  1 mg Oral Daily Georgiana Shore, NP   1 mg at 10/29/17 7782  . furosemide (LASIX) 250 mg in dextrose 5 % 250 mL (1 mg/mL) infusion  15 mg/hr Intravenous Continuous Georgiana Shore, NP 15 mL/hr at 10/29/17 1023 15 mg/hr at 10/29/17 1023  . insulin aspart (novoLOG) injection 0-15 Units  0-15 Units Subcutaneous TID WC Georgiana Shore, NP   2 Units at 10/29/17 [phone]  . ipratropium-albuterol (DUONEB) 0.5-2.5 (3) MG/3ML nebulizer solution 3 mL  3 mL Nebulization Q4H PRN Georgiana Shore, NP      . metolazone (ZAROXOLYN) tablet 2.5 mg  2.5 mg Oral Once Georgiana Shore, NP      . ondansetron [facility]) injection 4 mg  4 mg Intravenous Q6H PRN Georgiana Shore, NP      . oxymetazoline (AFRIN) 0.05 % nasal spray 2 spray  2 spray Each Nare BID PRN Georgiana Shore, NP   2 spray at 10/24/17 1048  . potassium chloride 20 MEQ/15ML (10%) solution 20 mEq  20 mEq Oral BID Bensimhon, Shaune Pascal, MD    20 mEq at 10/29/17 0834  . potassium chloride SA (K-DUR,KLOR-CON) CR tablet 40 mEq  40 mEq Oral Once Georgiana Shore, NP      . senna [facility]) tablet 8.6 mg  1 tablet Oral Daily PRN Georgiana Shore, NP   8.6 mg at 10/26/17 2058  . sodium chloride flush (NS) 0.9 % injection 10-40 mL  10-40 mL Intracatheter PRN Georgiana Shore, NP      . sodium chloride flush (NS) 0.9 % injection 3 mL  3 mL Intravenous Q12H Georgiana Shore, NP   3 mL at 10/29/17 0836  . sodium chloride flush (NS) 0.9 % injection 3 mL  3 mL Intravenous  PRN Georgiana Shore, NP      . spironolactone (ALDACTONE) tablet 25 mg  25 mg Oral Daily Georgiana Shore, NP   25 mg at 10/29/17 1610  . vitamin B-12 (CYANOCOBALAMIN) tablet 3,000 mcg  3,000 mcg Oral Daily Georgiana Shore, NP   3,000 mcg at 10/29/17 0834  . warfarin (COUMADIN) tablet 3 mg  3 mg Oral ONCE-1800 Belva Crome, MD      . Warfarin - Pharmacist Dosing Inpatient   Does not apply q1800 Georgiana Shore, NP        LABS: Lab Results  Component Value Date   WBC 3.7 (L) 10/29/2017   HGB 8.1 (L) 10/29/2017   HCT 26.4 (L) 10/29/2017   MCV 110.9 (H) 10/29/2017   PLT 86 (L) 10/29/2017      Component Value Date/Time   NA 140 10/29/2017 0500   NA 140 07/27/2017 1159   NA 140 01/19/2017 0827   K 4.1 10/29/2017 0500   K 4.8 01/19/2017 0827   CL 82 (L) 10/29/2017 0500   CO2 46 (H) 10/29/2017 0500   CO2 24 01/19/2017 0827   GLUCOSE 114 (H) 10/29/2017 0500   GLUCOSE 103 01/19/2017 0827   BUN 45 (H) 10/29/2017 0500   BUN 44 (H) 07/27/2017 1159   BUN 28.4 (H) 01/19/2017 0827   CREATININE 1.38 (H) 10/29/2017 0500   CREATININE 1.43 (H) 04/20/2017 0943   CREATININE 1.2 01/19/2017 0827   CALCIUM 9.6 10/29/2017 0500   CALCIUM 9.7 01/19/2017 0827   GFRNONAA 47 (L) 10/29/2017 0500   GFRNONAA 45 (L) 04/20/2017 0943   GFRAA 54 (L) 10/29/2017 0500   GFRAA 52 (L) 04/20/2017 0943   Lab Results  Component Value Date   INR 2.46 10/29/2017   INR 2.65 10/28/2017   INR 2.92  10/27/2017   No results found for: PTT  SOCIAL HISTORY: Social History   Socioeconomic History  . Marital status: Married    Spouse name: Not on file  . Number of children: Not on file  . Years of education: Not on file  . Highest education level: Not on file  Occupational History  . Not on file  Social Needs  . Financial resource strain: Not on file  . Food insecurity:    Worry: Not on file    Inability: Not on file  . Transportation needs:    Medical: Not on file    Non-medical: Not on file  Tobacco Use  . Smoking status: Former Smoker    Packs/day: 1.00    Years: 30.00    Pack years: 30.00    Types: Cigarettes    Last attempt to quit: 04/03/1992    Years since quitting: 25.5  . Smokeless tobacco: Never Used  Substance and Sexual Activity  . Alcohol use: Yes    Comment: 4-6 ounces of wine daily  . Drug use: No    Comment: Half a cup a day.  Marland Kitchen Sexual activity: Not on file  Lifestyle  . Physical activity:    Days per week: Not on file    Minutes per session: Not on file  . Stress: Not on file  Relationships  . Social connections:    Talks on phone: Not on file    Gets together: Not on file    Attends religious service: Not on file    Active member of club or organization: Not on file    Attends meetings of clubs or organizations: Not on file  Relationship status: Not on file  . Intimate partner violence:    Fear of current or ex partner: Not on file    Emotionally abused: Not on file    Physically abused: Not on file    Forced sexual activity: Not on file  Other Topics Concern  . Not on file  Social History Narrative  . Not on file    FAMILY HISTORY: Family History  Problem Relation Age of Onset  . Cancer Father        lung   . Hypertension Mother     REVIEW OF SYSTEMS: Reviewed with the patient as per History of present illness. Psych: Patient denies having dental phobia.  DENTAL HISTORY: CHIEF COMPLAINT: The patient was referred by Dr.  [name] for a dental consultation.  HPI: [name] is an 82 year old male recently diagnosed with severe mitral regurgitation.  Patient also has a medical history significant for history of mechanical aortic valve replacement with chronic anticoagulation, acute on chronic diastolic heart failure, obstructive sleep apnea, diabetes mellitus, and thrombocytopenia.  Patient referred for evaluation of poor dentition prior to possible mitral valve replacement.  The patient currently denies acute toothaches, swellings, or abscesses.  Patient has not seen a dentist in over 15 years by report.  Patient had an exam and cleaning at that time.  Patient does not seek regular dental care due to loss of dental insurance by report.  Patient denies having partial dentures.  Patient denies having dental phobia.    DENTAL EXAMINATION: GENERAL: The patient is a well-developed, well-nourished male in no acute distress. HEAD AND NECK: There is no palpable neck lymphadenopathy.  The patient denies acute TMJ symptoms. INTRAORAL EXAM: Patient has normal saliva.  There is no evidence of oral abscess formation. DENTITION: The patient is missing tooth #16. PERIODONTAL: The patient has chronic periodontitis with plaque and calculus accumulations, generalized gingival recession, and incipient tooth mobility.  There is incipient to moderate bone loss noted. DENTAL CARIES/SUBOPTIMAL RESTORATIONS: Multiple dental caries are noted.  I would need a full series of dental radiographs to identify the extent of the dental caries.  Multiple fractured restorations are noted. ENDODONTIC: The patient currently denies acute pulpitis symptoms.  The patient does appear to have periapical pathology and radiolucency involving tooth #30. CROWN AND BRIDGE: There are no crown or bridge restorations noted. PROSTHODONTIC: The patient denies having partial dentures. OCCLUSION: The patient has a stable occlusion.  RADIOGRAPHIC INTERPRETATION: A  suboptimal orthopantogram was taken on 10/26/2017. Patient is missing tooth #16.  Dental caries are noted.  There is incipient a moderate bone loss noted.  Multiple amalgam restorations are noted.  There is periapical pathology and radiolucency associated with apices of tooth #30.  Multiple suboptimal dental restorations are noted.  ASSESSMENTS: 1.  Severe mitral regurgitation 2.  History of mechanical aortic valve replacement 3.  Chronic anticoagulation 4.  Pre-heart valve surgery dental protocol 5.  Dental caries 6.  Chronic apical periodontitis 7.  Suboptimal dental restorations 8.  Chronic periodontitis of bone loss 9.  Accretions 10.  Gingival recession 11.  Incipient tooth mobility 12.  Stable occlusion 13.  Risk for bleeding with invasive dental procedure due to thrombocytopenia and current warfarin therapy  14.  Risk for complications up to and including death with anticipated invasive dental procedures in the operating room with general anesthesia   PLAN/RECOMMENDATIONS: 1. I discussed the risks, benefits, and complications of various treatment options with the patient in relationship to his medical and dental conditions,  possible mitral valve replacement, and risk for endocarditis. We discussed various treatment options to include no treatment, multiple extractions with alveoloplasty, pre-prosthetic surgery as indicated, periodontal therapy, dental restorations, root canal therapy, crown and bridge therapy, implant therapy, and replacement of missing teeth as indicated.  We also discussed the ideal need for a full series of dental radiographs as an outpatient before finalizing the treatment plan.  The patient currently wishes to proceed with outpatient follow-up at the Dental Medicine clinic after he is discharged to assist in developing a treatment plan.  The patient currently is having his medical therapy optimized before possibly being referred to a tertiary care center that can  provide the noninvasive mitral valve replacement that is being considered at this time.  The patient most likely will need multiple dental extractions with alveoloplasty and gross debridement of remaining dentition in the operating with general anesthesia after the warfarin therapy has been discontinued and patient is bridged with either Lovenox or heparin therapy.  I discussed this plan of care with Dr. [name] and he will assist in having the patient follow-up as an outpatient with Dental Medicine to help finalize the dental treatment plan.   2. Discussion of findings with medical team and coordination of future medical and dental care as needed.     Lenn Cal, DDS

## 2017-10-29 NOTE — Progress Notes (Signed)
Physical Therapy Treatment Patient Details Name: [name] MRN: [id number] DOB: Aug 18, 1935 Today's Date: 10/29/2017    History of Present Illness 82 y.o. male with PMH of mechanical aortic valve (1994), OSA on CPAP, a-fib, HF, CKD III,  prior CABG, and COPD, admitted 10/18/17 with progressive abdominal and LE swelling with anasarca. Worked up for acute on chronic diastolic CHF with prominent RV failure. Recommended aggressive medical therapy vs possible experimental percutaneous MVR as pt considered very high risk for conventional sx.    PT Comments    Pt progressing with mobility. Amb 180' with rollator and intermittent min guard for balance, progressing to supervision. Pt reports one step to enter home; will plan to practice this next treatment session. Pt reports he lives with wife who is in good physical condition and will be able to provide physical assist if needed. Remains limited by decreased activity tolerance.   Follow Up Recommendations  Home health PT;Supervision/Assistance - 24 hour     Equipment Recommendations  None recommended by PT    Recommendations for Other Services       Precautions / Restrictions Precautions Precautions: Fall Restrictions Weight Bearing Restrictions: No    Mobility  Bed Mobility Overal bed mobility: Needs Assistance Bed Mobility: Supine to Sit     Supine to sit: Min assist     General bed mobility comments: MinA for HHA to assist trunk elevation  Transfers Overall transfer level: Needs assistance Equipment used: Rolling walker (2 wheeled) Transfers: Sit to/from Stand Sit to Stand: Supervision;From elevated surface            Ambulation/Gait Ambulation/Gait assistance: Min guard Gait Distance (Feet): 180 Feet Assistive device: 4-wheeled walker Gait Pattern/deviations: Step-through pattern;Decreased stride length;Wide base of support Gait velocity: Decreased Gait velocity interpretation: <1.8 ft/sec, indicate of risk for  recurrent falls General Gait Details: Slow, steady amb with rollator and intermittent min guard for balance; DOE 2/4, SpO2 87% on 3L O2 [place]   Stairs Stairs: (Discussed ascending 1 step with rail into home; wife to transfer rollator into door as she does for pt normally)           Wheelchair Mobility    Modified Rankin (Stroke Patients Only)       Balance Overall balance assessment: Needs assistance Sitting-balance support: Feet supported;No upper extremity supported Sitting balance-Leahy Scale: Fair     Standing balance support: During functional activity;No upper extremity supported Standing balance-Leahy Scale: Fair Standing balance comment: more stable with RW support                            Cognition Arousal/Alertness: Awake/alert Behavior During Therapy: WFL for tasks assessed/performed Overall Cognitive Status: Within Functional Limits for tasks assessed                                        Exercises      General Comments        Pertinent Vitals/Pain Pain Assessment: No/denies pain    Home Living                      Prior Function            PT Goals (current goals can now be found in the care plan section) Acute Rehab PT Goals Patient Stated Goal: plan is to go home PT Goal Formulation: With  patient Time For Goal Achievement: 11/02/17 Potential to Achieve Goals: Good Progress towards PT goals: Progressing toward goals    Frequency    Min 3X/week      PT Plan Current plan remains appropriate    Co-evaluation              AM-PAC PT "6 Clicks" Daily Activity  Outcome Measure  Difficulty turning over in bed (including adjusting bedclothes, sheets and blankets)?: None Difficulty moving from lying on back to sitting on the side of the bed? : Unable Difficulty sitting down on and standing up from a chair with arms (e.g., wheelchair, bedside commode, etc,.)?: A Little Help needed moving to and  from a bed to chair (including a wheelchair)?: A Little Help needed walking in hospital room?: A Little Help needed climbing 3-5 steps with a railing? : A Little 6 Click Score: 17    End of Session Equipment Utilized During Treatment: Gait belt;Oxygen Activity Tolerance: Patient tolerated treatment well Patient left: in chair;with call bell/phone within reach;with family/visitor present Nurse Communication: Mobility status PT Visit Diagnosis: Unsteadiness on feet (R26.81);Other abnormalities of gait and mobility (R26.89);Muscle weakness (generalized) (M62.81);History of falling (Z91.81)     Time: 0156-1537 PT Time Calculation (min) (ACUTE ONLY): 30 min  Charges:  $Gait Training: 8-22 mins $Therapeutic Activity: 8-22 mins                     Mabeline Caras, PT, DPT Acute Rehab Services  Pager: [place] 10/29/2017, 2:32 PM

## 2017-10-29 NOTE — Progress Notes (Signed)
ANTICOAGULATION CONSULT NOTE - Follow-up Consult  Pharmacy Consult for Coumadin Indication: mechanical aortic valve + Afib  Allergies  Allergen Reactions  . Rifampin Rash    May have been caused by Vancomycin or Rifampin (??)  . Vancomycin Rash    May have been caused by Vancomycin or Rifampin (??)    Patient Measurements: Height: 6\' 2"  (188 cm) Weight: 253 lb 6.4 oz (114.9 kg) IBW/kg (Calculated) : 82.2 Heparin Dosing Weight: n/a   Vital Signs: Temp: 97.5 F (36.4 C) (07/29 0734) Temp Source: Oral (07/29 0734) BP: 85/56 (07/29 0734) Pulse Rate: 72 (07/29 0734)  Labs: Recent Labs    10/27/17 0530 10/28/17 0500 10/29/17 0500  HGB 7.9* 8.0* 8.1*  HCT 25.8* 25.7* 26.4*  PLT 85* 86* 86*  LABPROT 30.2* 28.0* 26.4*  INR 2.92 2.65 2.46  CREATININE 1.23 1.48* 1.38*    Estimated Creatinine Clearance: 57.5 mL/min (A) (by C-G formula based on SCr of 1.38 mg/dL (H)).   Medical History: Past Medical History:  Diagnosis Date  . Carotid artery disease ([place]) 1994   s/p left carotid endarerectomy   . Chronic atrial fibrillation (HCC)    a. on coumadin   . Chronic diastolic CHF (congestive heart failure) ([place])   . Hx of CABG    a. 1994  . Hypertension   . OSA (obstructive sleep apnea)   . Rheumatic fever   . S/P AVR (aortic valve replacement)    a. mechanical valve 1996  . Subclavian bypass stenosis ([place])   . Vitamin B12 deficiency    Assessment: 82 yo male on chronic Coumadin for mechanical aortic valve + afib. PTA Coumadin dose 5 mg daily except 2.5 mg on Mon, Fri.  INR therapeutic at 2.46. Hgb stable low at 8, plt 86 (chronically low). Meal intake 100%. Due to nose bleed noted during admission, plan to target INR goal closer to 2.5 per MD.   Goal of Therapy:  INR 2.5-3.5 Monitor platelets by anticoagulation protocol: Yes   Plan:  1. Warfarin 3 mg x 1 tonight 2. Monitor daily PT/INR, CBC and s/s of bleeding   [name], PharmD Clinical Pharmacist   Pager: [phone] Phone: [phone] 10/29/2017   9:23 AM

## 2017-10-29 NOTE — Progress Notes (Addendum)
Occupational Therapy Treatment Patient Details Name: [name] MRN: [id number] DOB: 1935/07/17 Today's Date: 10/29/2017    History of present illness 82 y.o. male with PMH of mechanical aortic valve (1994), OSA on CPAP, a-fib, HF, CKD III,  prior CABG, and COPD, admitted 10/18/17 with progressive abdominal and LE swelling with anasarca. Worked up for acute on chronic diastolic CHF with prominent RV failure. Recommended aggressive medical therapy vs possible experimental percutaneous MVR as pt considered very high risk for conventional sx.    OT comments  Patient progressing slowly. Seated in recliner upon entry with family at side, willing to participate. Noted supplemental oxygen OFF and saturation at 66% (unsure of accuracy due to cold fingers) but required with several minutes to recover.  He demonstrates ability to complete functional mobility using rollator (approx 80 feet) with supervision and toilet transfers with supervision, given cueing for safety and pacing/pursed lip breathing. Requires increased time for all activities.  Reviewed and provided energy conservation techniques handout; patient verbalized understanding reporting "I need one of those (long sponges) and a mans sock aide".  Continues to be limited by decreased activity tolerance.  Will continue to follow while admitted.    Follow Up Recommendations  Home health OT;Supervision - Intermittent    Equipment Recommendations  Other (comment)(long handled sponge, sock aide)    Recommendations for Other Services      Precautions / Restrictions Precautions Precautions: Fall Precaution Comments: 2 months ago tripped on throw rug, all throw rugs gone from home Restrictions Weight Bearing Restrictions: No       Mobility Bed Mobility Overal bed mobility: Needs Assistance Bed Mobility: Supine to Sit     Supine to sit: Min assist     General bed mobility comments: OOB seated in recliner upon entry  Transfers Overall  transfer level: Needs assistance Equipment used: 4-wheeled walker Transfers: Sit to/from Stand Sit to Stand: Supervision         General transfer comment: close supervision for safety, cueing for pacing     Balance Overall balance assessment: Needs assistance Sitting-balance support: Feet supported;No upper extremity supported Sitting balance-Leahy Scale: Good     Standing balance support: During functional activity;No upper extremity supported Standing balance-Leahy Scale: Fair Standing balance comment: preference to B UE support                           ADL either performed or assessed with clinical judgement   ADL Overall ADL's : Needs assistance/impaired                         Toilet Transfer: Ambulation;Supervision/safety(rollator, simulated to recliner  ) Toilet Transfer Details (indicate cue type and reason): cueing for hand placement, pacing and safety         Functional mobility during ADLs: Supervision/safety(rollator ) General ADL Comments: Pt performing func mobility with rollator, transfers, and discussion on energy conservation techniques.      Vision       Perception     Praxis      Cognition Arousal/Alertness: Awake/alert Behavior During Therapy: WFL for tasks assessed/performed Overall Cognitive Status: Within Functional Limits for tasks assessed                                          Exercises     Shoulder Instructions  General Comments daughters present; supplemental oxygen OFF upon entry with SPO2 at 66% requiring several minutes to recover (unsure of accuracy due to cold fingers)    Pertinent Vitals/ Pain       Pain Assessment: No/denies pain  Home Living                                          Prior Functioning/Environment              Frequency  Min 2X/week        Progress Toward Goals  OT Goals(current goals can now be found in the care plan  section)  Progress towards OT goals: Progressing toward goals  Acute Rehab OT Goals Patient Stated Goal: to go home  OT Goal Formulation: With patient Time For Goal Achievement: 10/27/17 Potential to Achieve Goals: Good  Plan Discharge plan remains appropriate    Co-evaluation                 AM-PAC PT "6 Clicks" Daily Activity     Outcome Measure   Help from another person eating meals?: None Help from another person taking care of personal grooming?: A Little Help from another person toileting, which includes using toliet, bedpan, or urinal?: A Little Help from another person bathing (including washing, rinsing, drying)?: A Lot Help from another person to put on and taking off regular upper body clothing?: A Little Help from another person to put on and taking off regular lower body clothing?: A Lot 6 Click Score: 17    End of Session Equipment Utilized During Treatment: Oxygen;Other (comment)(rollator)  OT Visit Diagnosis: Other (comment);Muscle weakness (generalized) (M62.81)(decreased activity tolerance)   Activity Tolerance Patient limited by fatigue   Patient Left in chair;with call bell/phone within reach;with family/visitor present   Nurse Communication Mobility status(SpO2)        Time: 7893-8101 OT Time Calculation (min): 25 min  Charges: OT General Charges $OT Visit: 1 Visit OT Treatments $Self Care/Home Management : 23-37 mins  Delight Stare, OTR/L  Pager [place] 10/29/2017, 3:47 PM

## 2017-10-30 LAB — BASIC METABOLIC PANEL
ANION GAP: 14 (ref 5–15)
BUN: 44 mg/dL — AB (ref 8–23)
CALCIUM: 9.6 mg/dL (ref 8.9–10.3)
CO2: 41 mmol/L — AB (ref 22–32)
CREATININE: 1.28 mg/dL — AB (ref 0.61–1.24)
Chloride: 83 mmol/L — ABNORMAL LOW (ref 98–111)
GFR calc Af Amer: 59 mL/min — ABNORMAL LOW (ref >60)
GFR calc non Af Amer: 51 mL/min — ABNORMAL LOW (ref >60)
GLUCOSE: 112 mg/dL — AB (ref 70–99)
Potassium: 3.2 mmol/L — ABNORMAL LOW (ref 3.5–5.1)
Sodium: 138 mmol/L (ref 135–145)

## 2017-10-30 LAB — CBC
HEMATOCRIT: 26.3 % — AB (ref 39.0–52.0)
Hemoglobin: 7.9 g/dL — ABNORMAL LOW (ref 13.0–17.0)
MCH: 33.8 pg (ref 26.0–34.0)
MCHC: 30 g/dL (ref 30.0–36.0)
MCV: 112.4 fL — AB (ref 78.0–100.0)
Platelets: 72 10*3/uL — ABNORMAL LOW (ref 150–400)
RBC: 2.34 MIL/uL — ABNORMAL LOW (ref 4.22–5.81)
RDW: 18.2 % — AB (ref 11.5–15.5)
WBC: 3.6 10*3/uL — ABNORMAL LOW (ref 4.0–10.5)

## 2017-10-30 LAB — FOLATE: Folate: 60.9 ng/mL (ref >5.9)

## 2017-10-30 LAB — RETICULOCYTES
RBC.: 2.43 MIL/uL — AB (ref 4.22–5.81)
RETIC CT PCT: 3.9 % — AB (ref 0.4–3.1)
Retic Count, Absolute: 94.8 10*3/uL (ref 19.0–186.0)

## 2017-10-30 LAB — MAGNESIUM: Magnesium: 2.2 mg/dL (ref 1.7–2.4)

## 2017-10-30 LAB — GLUCOSE, CAPILLARY
GLUCOSE-CAPILLARY: 123 mg/dL — AB (ref 70–99)
Glucose-Capillary: 120 mg/dL — ABNORMAL HIGH (ref 70–99)
Glucose-Capillary: 114 mg/dL — ABNORMAL HIGH (ref 70–99)
Glucose-Capillary: 128 mg/dL — ABNORMAL HIGH (ref 70–99)

## 2017-10-30 LAB — VITAMIN B12: Vitamin B-12: 3980 pg/mL — ABNORMAL HIGH (ref 180–914)

## 2017-10-30 LAB — FERRITIN: FERRITIN: 104 ng/mL (ref 24–336)

## 2017-10-30 LAB — IRON AND TIBC
Iron: 64 ug/dL (ref 45–182)
SATURATION RATIOS: 18 % (ref 17.9–39.5)
TIBC: 357 ug/dL (ref 250–450)
UIBC: 293 ug/dL

## 2017-10-30 LAB — PROTIME-INR
INR: 2.39
Prothrombin Time: 25.9 seconds — ABNORMAL HIGH (ref 11.4–15.2)

## 2017-10-30 MED ORDER — CARVEDILOL 3.125 MG PO TABS
3.1250 mg | ORAL_TABLET | Freq: Two times a day (BID) | ORAL | Status: DC
  Administered 2017-11-01 (×2): 3.125 mg via ORAL
  Filled 2017-10-30 (×6): qty 1

## 2017-10-30 MED ORDER — WARFARIN SODIUM 5 MG PO TABS
6.0000 mg | ORAL_TABLET | Freq: Once | ORAL | Status: AC
  Administered 2017-10-30: 6 mg via ORAL
  Filled 2017-10-30: qty 1

## 2017-10-30 MED ORDER — SPIRONOLACTONE 12.5 MG HALF TABLET
12.5000 mg | ORAL_TABLET | Freq: Every day | ORAL | Status: DC
  Administered 2017-10-31 – 2017-11-02 (×3): 12.5 mg via ORAL
  Filled 2017-10-30 (×5): qty 1

## 2017-10-30 MED ORDER — POTASSIUM CHLORIDE 20 MEQ/15ML (10%) PO SOLN
60.0000 meq | Freq: Two times a day (BID) | ORAL | Status: AC
  Administered 2017-10-30 – 2017-11-02 (×8): 60 meq via ORAL
  Filled 2017-10-30 (×9): qty 45

## 2017-10-30 MED ORDER — ACETAZOLAMIDE 250 MG PO TABS
250.0000 mg | ORAL_TABLET | Freq: Two times a day (BID) | ORAL | Status: AC
  Administered 2017-10-30 (×2): 250 mg via ORAL
  Filled 2017-10-30 (×2): qty 1

## 2017-10-30 MED ORDER — METOLAZONE 5 MG PO TABS
2.5000 mg | ORAL_TABLET | Freq: Once | ORAL | Status: AC
Start: 2017-10-30 — End: 2017-10-30
  Administered 2017-10-30: 2.5 mg via ORAL
  Filled 2017-10-30: qty 1

## 2017-10-30 MED ORDER — POTASSIUM CHLORIDE CRYS ER 20 MEQ PO TBCR
30.0000 meq | EXTENDED_RELEASE_TABLET | Freq: Once | ORAL | Status: AC
  Administered 2017-10-30: 30 meq via ORAL
  Filled 2017-10-30: qty 1

## 2017-10-30 NOTE — Progress Notes (Signed)
1406-1430 75 afib, 82/48.; came to walk with pt. He stated his BP has been low and he is resting. Gave pt low sodium diets and discussed the 2000 mg restriction. Encouraged daily weight and calling MD with weight gain or increased swelling in feet, ankle, stomach or hands. Graylon Good RN BSN 10/30/2017 2:27 PM

## 2017-10-30 NOTE — Plan of Care (Signed)
  Problem: Clinical Measurements: Goal: Respiratory complications will improve Outcome: Progressing Note:  Stable on O2 at 2L/[place].

## 2017-10-30 NOTE — Progress Notes (Signed)
Reported by a nurse tech about his low BP.  Pt was sitting in a recliner.  BP 72/49 HR 72. Rechecked manually and was 70/42.  Pt denied dizziness or lightheadedness. Transferred pt to bed.  BP lying was 82/48  HR 74.  Idolina Primer, RN

## 2017-10-30 NOTE — Progress Notes (Signed)
ANTICOAGULATION CONSULT NOTE - Follow-up Consult  Pharmacy Consult for Coumadin Indication: mechanical aortic valve + Afib  Allergies  Allergen Reactions  . Rifampin Rash    May have been caused by Vancomycin or Rifampin (??)  . Vancomycin Rash    May have been caused by Vancomycin or Rifampin (??)    Patient Measurements: Height: 6\' 2"  (188 cm) Weight: 246 lb 4.8 oz (111.7 kg) IBW/kg (Calculated) : 82.2 Heparin Dosing Weight: n/a   Vital Signs: Temp: 97.8 F (36.6 C) (07/30 0356) Temp Source: Oral (07/30 0356) BP: 82/58 (07/30 0356) Pulse Rate: 66 (07/30 0356)  Labs: Recent Labs    10/28/17 0500 10/29/17 0500 10/29/17 1509 10/30/17 0527  HGB 8.0* 8.1*  --   --   HCT 25.7* 26.4*  --   --   PLT 86* 86*  --   --   LABPROT 28.0* 26.4*  --  25.9*  INR 2.65 2.46  --  2.39  CREATININE 1.48* 1.38* 1.30*  --     Estimated Creatinine Clearance: 60.3 mL/min (A) (by C-G formula based on SCr of 1.3 mg/dL (H)).   Medical History: Past Medical History:  Diagnosis Date  . Carotid artery disease ([place]) 1994   s/p left carotid endarerectomy   . Chronic atrial fibrillation (HCC)    a. on coumadin   . Chronic diastolic CHF (congestive heart failure) ([place])   . Hx of CABG    a. 1994  . Hypertension   . OSA (obstructive sleep apnea)   . Rheumatic fever   . S/P AVR (aortic valve replacement)    a. mechanical valve 1996  . Subclavian bypass stenosis ([place])   . Vitamin B12 deficiency    Assessment: 82 yo male on chronic Coumadin for mechanical aortic valve + afib. PTA Coumadin dose 5 mg daily except 2.5 mg on Mon, Fri.  INR slightly subtherapeutic at 2.39. Hgb stable low at 7.9, plt 72 (chronically low). Due to nose bleed noted during admission, plan to target INR goal closer to 2.5 per MD. No new medication interactions.  Goal of Therapy:  INR 2.5-3.5 Monitor platelets by anticoagulation protocol: Yes   Plan:  1. Warfarin 6 mg x 1 tonight 2. Monitor daily PT/INR, CBC  and s/s of bleeding   Doylene Canard, PharmD Clinical Pharmacist  Pager: [phone] Phone: [phone] 10/30/2017   7:18 AM

## 2017-10-30 NOTE — Progress Notes (Addendum)
Patient ID: [name], male   DOB: 1935/10/30, 82 y.o.   MRN: [id number]     Advanced Heart Failure Rounding Note  PCP-Cardiologist: Belva Crome III, MD   Subjective:    Weight down another 7 pounds (53 pounds total). Lasix drip restarted yesterday + diamox. Received metolazone x1 yesterday.    Creatinine 1.2->1.5 ->1.38 -> 1.28. CVP 17. CO2 41. SBP 80-90s  Feels much better today. Denies SOB, orthopnea, dizziness, or CP. Walked with PT and OT yesterday.   TEE (7/23): showed EF 55-60%, mildly dilated/dysfunctional RV, severe MR likely mixed primary and secondary etiology with minimal mitral stenosis.  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   Objective:   Weight Range: 246 lb 4.8 oz (111.7 kg) Body mass index is 31.62 kg/m.   Vital Signs:   Temp:  [97.8 F (36.6 C)-98.4 F (36.9 C)] 97.8 F (36.6 C) (07/30 0356) Pulse Rate:  [62-70] 66 (07/30 0356) BP: (82-101)/(53-78) 82/58 (07/30 0356) SpO2:  [96 %-98 %] 96 % (07/30 0356) Weight:  [246 lb 4.8 oz (111.7 kg)] 246 lb 4.8 oz (111.7 kg) (07/30 0356) Last BM Date: 10/29/17  Weight change: Filed Weights   10/28/17 0249 10/29/17 0450 10/30/17 0356  Weight: 256 lb 6.4 oz (116.3 kg) 253 lb 6.4 oz (114.9 kg) 246 lb 4.8 oz (111.7 kg)   Intake/Output:   Intake/Output Summary (Last 24 hours) at 10/30/2017 0810 Last data filed at 10/30/2017 0700 Gross per 24 hour  Intake 310.22 ml  Output 4550 ml  Net -4239.78 ml    Physical Exam    CVP 17 General:  No resp difficulty. HEENT: Normal Neck: Supple. JVP 17. Carotids 2+ bilat; no bruits. No thyromegaly or nodule noted. Cor: PMI nondisplaced. IRR,Mechanical S2, 2/6 SEM at RUSB Lungs: CTAB, normal effort. Abdomen: Soft, non-tender, non-distended, no HSM. No bruits or masses. +BS  Extremities: No cyanosis, clubbing, or rash. R and LLE no edema. RUE PICC Neuro: Alert & orientedx3, cranial nerves grossly intact. moves all 4 extremities w/o difficulty. Affect  pleasant  Telemetry   Afib 60-70s. Personally reviewed.   Labs    CBC Recent Labs    10/28/17 0500 10/29/17 0500  WBC 4.3 3.7*  HGB 8.0* 8.1*  HCT 25.7* 26.4*  MCV 109.8* 110.9*  PLT 86* 86*   Basic Metabolic Panel Recent Labs    10/29/17 0500 10/29/17 1509 10/30/17 0651  NA 140 135 138  K 4.1 3.8 3.2*  CL 82* 83* 83*  CO2 46* 43* 41*  GLUCOSE 114* 156* 112*  BUN 45* 44* 44*  CREATININE 1.38* 1.30* 1.28*  CALCIUM 9.6 9.6 9.6  MG 2.2  --  2.2   Liver Function Tests No results for input(s): AST, ALT, ALKPHOS, BILITOT, PROT, ALBUMIN in the last 72 hours. No results for input(s): LIPASE, AMYLASE in the last 72 hours. Cardiac Enzymes No results for input(s): CKTOTAL, CKMB, CKMBINDEX, TROPONINI in the last 72 hours.  BNP: BNP (last 3 results) Recent Labs    08/01/17 1804 10/18/17 1510  BNP 784.4* 1,073.3*    ProBNP (last 3 results) Recent Labs    03/30/17 1147 05/25/17 1044 07/10/17 1523  PROBNP 1,858* 2,749* 411.0*     D-Dimer No results for input(s): DDIMER in the last 72 hours. Hemoglobin A1C No results for input(s): HGBA1C in the last 72 hours. Fasting Lipid Panel No results for input(s): CHOL, HDL, LDLCALC, TRIG, CHOLHDL, LDLDIRECT in the last 72 hours. Thyroid Function Tests No results for input(s): TSH, T4TOTAL, T3FREE, THYROIDAB  in the last 72 hours.  Invalid input(s): FREET3  Other results:   Imaging    No results found.   Medications:     Scheduled Medications: . carvedilol  6.25 mg Oral BID WC  . docusate sodium  100 mg Oral BID  . ezetimibe-simvastatin  1 tablet Oral QHS  . folic acid  1 mg Oral Daily  . insulin aspart  0-15 Units Subcutaneous TID WC  . potassium chloride  20 mEq Oral BID  . sodium chloride flush  3 mL Intravenous Q12H  . spironolactone  25 mg Oral Daily  . vitamin B-12  3,000 mcg Oral Daily  . Warfarin - Pharmacist Dosing Inpatient   Does not apply q1800    Infusions: . sodium chloride    .  furosemide (LASIX) infusion 15 mg/hr (10/30/17 0313)    PRN Medications: sodium chloride, acetaminophen, fluocinonide cream, ipratropium-albuterol, ondansetron (ZOFRAN) IV, oxymetazoline, senna, sodium chloride flush, sodium chloride flush    Patient Profile   [name] is a 82 y.o. male with h/o mechanical AVR 1994, OSA on CPAP, Chronic afib, chronic combined CHF, CKD III, chronic coumadin therapy, CAD s/p CAGB 1994, and COPD.   Admitted from [facility] office 10/18/17 with Acute on chronic diastolic CHF.   Assessment/Plan   1. Acute on chronic diastolic CHF with prominent RV failure: - Echo this admission showed EF 55-65%, mechanical aortic valve functioning normally, mild mitral stenosis/severe mitral regurgitation, RV moderately dilated with moderately decreased systolic function, PASP 49 mmHg.  Severe MR could be contributing to the significant RV failure.  - Overall weight down 53 pounds. Volume status still looks elevated.  - CVP 17. Continue lasix @ 15 mg/hr. Continue diamox 250 mg BID. CO2 41. Give metolazone 2.5 mg again today.  - Continue spironolactone 25 mg daily.  - Increase potassium supplement - Decrease coreg to 3.125 mg BID to allow room for BP.  3. Mechanical aortic valve: Appears to function well on echo this admission.  - Continue warfarin with INR goal 2.5-3.5. - INR 2.39  Discussed dosing with PharmD personally. 4. Atrial fibrillation: Chronic.  - Rate controlled 60-70s. Decrease coreg to 3.125 mg BID to allow BP room for diuresis.  - INR 2.39 Continue warfarin. Keep INR closer 2.5 with nose bleed. Discussed dosing with PharmD personally. 5. CAD: s/p CABG.  - No s/s ischemia - Continue Vytorin.  6. COPD:  - On home oxygen. No change.   7. OSA:  - Continue home CPAP. No change.     8. Acute on CKD: Stage 3.   - Continue lasix drip. Creatinine improving 1.38 > 1.48 > 1.28 9. Constipation - Resolved. No change.  10. Deconditioning - He will need HH PT/OT and  24 hour supervision. Got up with PT and OT yesterday. 11. Epistaxis - Resolved. No change.  12. Anemia  - Hemoglobin 7.9. Check iron studies and hemoccult. Denies bleeding.  13. Thrombocytopenia: This appears chronic, had before this admission. No change.  14. Severe MR - TEE with severe MR with possible rheumatic MV (do not think MS is significant, mildly elevated mean gradient from high flow with severe MR): Structural Heart evaluated. It does not appear that he will be a Mitraclip candidate. Hs been seen by Dr. [name] who recommended aggressive medical therapy versus possible experimental percutaneous MVR as he felt very high risk for conventional surgery  15. Hypokalemia - K 3.2. Increase supp. Recheck this afternoon.  Length of Stay: [place], NP  10/30/2017, 8:10 AM  Advanced Heart Failure Team Pager [phone] (M-F; [place])  Please contact [facility] for night-coverage after hours (4p -7a ) and weekends on [email or web address]  Patient seen with NP, agree with the above note.  He diuresed well yesterday, CVP down to 17.  SBP generally in 80s-90s, no lightheadedness.  Walked to the nursing desk today without significant dyspnea. Creatinine stable at 1.28. HCO3 down to 41.   On exam, he does appear volume overloaded with JVP 16+ cm and peripheral edema. However, his weight is down 53 lbs.    Continue diuresis with Lasix 15 mg/hr + metolazone 2.5 x 1 + acetazolamide 250 mg bid with elevated HCO3. Follow BUN/creatinine closely but trending down  With soft BP, will decrease Coreg to 3.125 mg bid and spironolactone to 12.5 mg daily.    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 Mitral stenosis is minimal. We sent off his TEE for Mitraclip evaluation, but it appears his MVA may be too small for successful clip (<3.0 cm^2). If he cannot get Mitraclip, options will be ongoing medical management versus MV surgery versus transcatheter MVR within clinical trial. Dr. [name] has seen,  surgical risk very high and he would be reluctant to operate in this situation.  I think at this point it may be best to medically optimize him and then look for a trial site for transcatheter MVR.    Continue mobilize.   Loralie Champagne 10/30/2017 1:48 PM

## 2017-10-31 ENCOUNTER — Telehealth: Payer: Self-pay | Admitting: Internal Medicine

## 2017-10-31 LAB — CBC
HCT: 26.7 % — ABNORMAL LOW (ref 39.0–52.0)
Hemoglobin: 8.3 g/dL — ABNORMAL LOW (ref 13.0–17.0)
MCH: 34.2 pg — ABNORMAL HIGH (ref 26.0–34.0)
MCHC: 31.1 g/dL (ref 30.0–36.0)
MCV: 109.9 fL — ABNORMAL HIGH (ref 78.0–100.0)
PLATELETS: 76 10*3/uL — AB (ref 150–400)
RBC: 2.43 MIL/uL — ABNORMAL LOW (ref 4.22–5.81)
RDW: 17.1 % — ABNORMAL HIGH (ref 11.5–15.5)
WBC: 4.3 10*3/uL (ref 4.0–10.5)

## 2017-10-31 LAB — PROTIME-INR
INR: 1.93
Prothrombin Time: 21.9 seconds — ABNORMAL HIGH (ref 11.4–15.2)

## 2017-10-31 LAB — GLUCOSE, CAPILLARY
GLUCOSE-CAPILLARY: 118 mg/dL — AB (ref 70–99)
Glucose-Capillary: 105 mg/dL — ABNORMAL HIGH (ref 70–99)
Glucose-Capillary: 118 mg/dL — ABNORMAL HIGH (ref 70–99)
Glucose-Capillary: 117 mg/dL — ABNORMAL HIGH (ref 70–99)

## 2017-10-31 LAB — BASIC METABOLIC PANEL
Anion gap: 13 (ref 5–15)
BUN: 46 mg/dL — ABNORMAL HIGH (ref 8–23)
CO2: 44 mmol/L — ABNORMAL HIGH (ref 22–32)
Calcium: 9.7 mg/dL (ref 8.9–10.3)
Chloride: 81 mmol/L — ABNORMAL LOW (ref 98–111)
Creatinine, Ser: 1.34 mg/dL — ABNORMAL HIGH (ref 0.61–1.24)
GFR, EST AFRICAN AMERICAN: 56 mL/min — AB (ref >60)
GFR, EST NON AFRICAN AMERICAN: 48 mL/min — AB (ref >60)
Glucose, Bld: 121 mg/dL — ABNORMAL HIGH (ref 70–99)
Potassium: 4.4 mmol/L (ref 3.5–5.1)
SODIUM: 138 mmol/L (ref 135–145)

## 2017-10-31 MED ORDER — ACETAZOLAMIDE 250 MG PO TABS
250.0000 mg | ORAL_TABLET | Freq: Two times a day (BID) | ORAL | Status: DC
  Administered 2017-10-31 – 2017-11-02 (×5): 250 mg via ORAL
  Filled 2017-10-31 (×5): qty 1

## 2017-10-31 MED ORDER — WARFARIN SODIUM 7.5 MG PO TABS
7.5000 mg | ORAL_TABLET | Freq: Once | ORAL | Status: AC
  Administered 2017-10-31: 7.5 mg via ORAL
  Filled 2017-10-31: qty 1

## 2017-10-31 MED ORDER — METOLAZONE 5 MG PO TABS
2.5000 mg | ORAL_TABLET | Freq: Once | ORAL | Status: AC
  Administered 2017-10-31: 2.5 mg via ORAL
  Filled 2017-10-31: qty 1

## 2017-10-31 NOTE — Progress Notes (Signed)
Occupational Therapy Treatment Patient Details Name: [name] MRN: [id number] DOB: Jul 15, 1935 Today's Date: 10/31/2017    History of present illness 82 y.o. male with PMH of mechanical aortic valve (1994), OSA on CPAP, a-fib, HF, CKD III,  prior CABG, and COPD, admitted 10/18/17 with progressive abdominal and LE swelling with anasarca. Worked up for acute on chronic diastolic CHF with prominent RV failure. Recommended aggressive medical therapy vs possible experimental percutaneous MVR as pt considered very high risk for conventional sx.    OT comments  Patient supine in bed and willing to participate in OT (cleared with RN). Patient progressing slowly and continues to be limited by decreased activity tolerance.  Able to complete bed mobility with min assist, standing grooming with min guard to close supervision, and short distance mobility with min guard to close supervision using RW.  Able to recall 2 energy conservation techniques, reviewed further techniques with patient.  Patient oxygen saturation maintained throughout session on 2L supplemental via [place].  Will continue to follow while admitted.    Follow Up Recommendations  Home health OT;Supervision - Intermittent    Equipment Recommendations  Other (comment)(long handled sponge )    Recommendations for Other Services      Precautions / Restrictions Precautions Precautions: Fall Precaution Comments: 2 months ago tripped on throw rug, all throw rugs gone from home Restrictions Weight Bearing Restrictions: No       Mobility Bed Mobility Overal bed mobility: Needs Assistance Bed Mobility: Supine to Sit;Sit to Supine     Supine to sit: Min assist;HOB elevated Sit to supine: Min assist   General bed mobility comments: minA for HHA to pull against, min A to manage R LE into bed  Transfers Overall transfer level: Needs assistance Equipment used: Rolling walker (2 wheeled) Transfers: Sit to/from Stand Sit to Stand:  Supervision         General transfer comment: close supervision for safety and balance, good recall of hand placement and safety     Balance Overall balance assessment: Needs assistance Sitting-balance support: Feet supported;No upper extremity supported Sitting balance-Leahy Scale: Good     Standing balance support: During functional activity;No upper extremity supported Standing balance-Leahy Scale: Fair Standing balance comment: min guard for safety, preference to B UE support                           ADL either performed or assessed with clinical judgement   ADL Overall ADL's : Needs assistance/impaired     Grooming: Wash/dry hands;Standing;Min guard;Supervision/safety Grooming Details (indicate cue type and reason): min guard to supervision for safety                              Functional mobility during ADLs: Min guard;Rolling walker General ADL Comments: Patient completed bed mobility, grooming at sink, and short distance mobility in hallway.      Vision       Perception     Praxis      Cognition Arousal/Alertness: Awake/alert Behavior During Therapy: WFL for tasks assessed/performed Overall Cognitive Status: Within Functional Limits for tasks assessed                                          Exercises     Shoulder Instructions  General Comments 2L supplemental oxygen via [place] with saturations maintained     Pertinent Vitals/ Pain       Pain Assessment: No/denies pain  Home Living                                          Prior Functioning/Environment              Frequency  Min 2X/week        Progress Toward Goals  OT Goals(current goals can now be found in the care plan section)  Progress towards OT goals: Progressing toward goals  Acute Rehab OT Goals Patient Stated Goal: to go home  OT Goal Formulation: With patient Time For Goal Achievement: 10/27/17 Potential  to Achieve Goals: Good  Plan Discharge plan remains appropriate    Co-evaluation                 AM-PAC PT "6 Clicks" Daily Activity     Outcome Measure   Help from another person eating meals?: None Help from another person taking care of personal grooming?: A Little Help from another person toileting, which includes using toliet, bedpan, or urinal?: A Little Help from another person bathing (including washing, rinsing, drying)?: A Lot Help from another person to put on and taking off regular upper body clothing?: A Little Help from another person to put on and taking off regular lower body clothing?: A Lot 6 Click Score: 17    End of Session Equipment Utilized During Treatment: Oxygen;Rolling walker;Gait belt  OT Visit Diagnosis: Other (comment);Muscle weakness (generalized) (M62.81)(decreased activity tolerance)   Activity Tolerance Patient tolerated treatment well   Patient Left in chair;with call bell/phone within reach   Nurse Communication Mobility status;Other (comment)(L ankle instability)        Time: 3976-7341 OT Time Calculation (min): 28 min  Charges: OT General Charges $OT Visit: 1 Visit OT Treatments $Self Care/Home Management : 23-37 mins  Delight Stare, OTR/L  Pager [place] 10/31/2017, 4:17 PM

## 2017-10-31 NOTE — Progress Notes (Signed)
PT Cancellation Note  Patient Details Name: [name] MRN: [id number] DOB: [birth year]   Cancelled Treatment:    Reason Eval/Treat Not Completed: Medical issues which prohibited therapy.  Hypotensive today and will try later as time and pt allow.   Ramond Dial 10/31/2017, 3:21 PM   Mee Hives, PT MS Acute Rehab Dept. Number: [place] [facility] [place]

## 2017-10-31 NOTE — Progress Notes (Addendum)
Patient ID: [name], male   DOB: September 25, 1935, 82 y.o.   MRN: [id number]     Advanced Heart Failure Rounding Note  PCP-Cardiologist: Belva Crome III, MD   Subjective:    Weight down another 7 lbs. (60 pounds total). Lasix drip restarted yesterday + diamox + metolazone.  Creatinine 1.2->1.5 ->1.38 -> 1.28 -> 1.34. CVP 15-16. CO2 44. SBP 80-90s   Feels okay today. Denies CP, orthopnea, dizziness, or SOB   TEE (7/23): showed EF 55-60%, mildly dilated/dysfunctional RV, severe MR likely mixed primary and secondary etiology with minimal mitral stenosis.  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   Objective:   Weight Range: 239 lb 9.6 oz (108.7 kg) Body mass index is 30.76 kg/m.   Vital Signs:   Temp:  [97.7 F (36.5 C)-98.7 F (37.1 C)] 98.1 F (36.7 C) (07/31 0814) Pulse Rate:  [58-83] 72 (07/31 0814) Resp:  [16-19] 19 (07/31 0528) BP: (67-97)/(38-66) 97/62 (07/31 0814) SpO2:  [95 %-100 %] 97 % (07/31 0814) Weight:  [239 lb 9.6 oz (108.7 kg)] 239 lb 9.6 oz (108.7 kg) (07/31 0446) Last BM Date: 10/29/17  Weight change: Filed Weights   10/29/17 0450 10/30/17 0356 10/31/17 0446  Weight: 253 lb 6.4 oz (114.9 kg) 246 lb 4.8 oz (111.7 kg) 239 lb 9.6 oz (108.7 kg)   Intake/Output:   Intake/Output Summary (Last 24 hours) at 10/31/2017 0848 Last data filed at 10/31/2017 0816 Gross per 24 hour  Intake 936.71 ml  Output 3175 ml  Net -2238.29 ml    Physical Exam    CVP 15-16 General: No resp difficulty. HEENT: Normal Neck: Supple. JVP 12 cm. Carotids 2+ bilat; no bruits. No thyromegaly or nodule noted. Cor: PMI nondisplaced. IRR, mechanical S2, 2/6 SEM at RUSB Lungs: diminished in bases Abdomen: Soft, non-tender, non-distended, no HSM. No bruits or masses. +BS  Extremities: No cyanosis, clubbing, or rash. R and LLE no edema. RUE PICC Neuro: Alert & orientedx3, cranial nerves grossly intact. moves all 4 extremities w/o difficulty. Affect pleasant   Telemetry   Afib  70-80s. Personally reviewed.   Labs    CBC Recent Labs    10/30/17 0527 10/31/17 0346  WBC 3.6* 4.3  HGB 7.9* 8.3*  HCT 26.3* 26.7*  MCV 112.4* 109.9*  PLT 72* 76*   Basic Metabolic Panel Recent Labs    10/29/17 0500  10/30/17 0651 10/30/17 2306  NA 140   < > 138 138  K 4.1   < > 3.2* 4.4  CL 82*   < > 83* 81*  CO2 46*   < > 41* 44*  GLUCOSE 114*   < > 112* 121*  BUN 45*   < > 44* 46*  CREATININE 1.38*   < > 1.28* 1.34*  CALCIUM 9.6   < > 9.6 9.7  MG 2.2  --  2.2  --    < > = values in this interval not displayed.   Liver Function Tests No results for input(s): AST, ALT, ALKPHOS, BILITOT, PROT, ALBUMIN in the last 72 hours. No results for input(s): LIPASE, AMYLASE in the last 72 hours. Cardiac Enzymes No results for input(s): CKTOTAL, CKMB, CKMBINDEX, TROPONINI in the last 72 hours.  BNP: BNP (last 3 results) Recent Labs    08/01/17 1804 10/18/17 1510  BNP 784.4* 1,073.3*    ProBNP (last 3 results) Recent Labs    03/30/17 1147 05/25/17 1044 07/10/17 1523  PROBNP 1,858* 2,749* 411.0*     D-Dimer No results for input(s):  DDIMER in the last 72 hours. Hemoglobin A1C No results for input(s): HGBA1C in the last 72 hours. Fasting Lipid Panel No results for input(s): CHOL, HDL, LDLCALC, TRIG, CHOLHDL, LDLDIRECT in the last 72 hours. Thyroid Function Tests No results for input(s): TSH, T4TOTAL, T3FREE, THYROIDAB in the last 72 hours.  Invalid input(s): FREET3  Other results:   Imaging    No results found.   Medications:     Scheduled Medications: . carvedilol  3.125 mg Oral BID WC  . docusate sodium  100 mg Oral BID  . ezetimibe-simvastatin  1 tablet Oral QHS  . folic acid  1 mg Oral Daily  . insulin aspart  0-15 Units Subcutaneous TID WC  . potassium chloride  60 mEq Oral BID  . sodium chloride flush  3 mL Intravenous Q12H  . spironolactone  12.5 mg Oral Daily  . vitamin B-12  3,000 mcg Oral Daily  . Warfarin - Pharmacist Dosing  Inpatient   Does not apply q1800    Infusions: . sodium chloride    . furosemide (LASIX) infusion 15 mg/hr (10/31/17 0602)    PRN Medications: sodium chloride, acetaminophen, fluocinonide cream, ipratropium-albuterol, ondansetron (ZOFRAN) IV, oxymetazoline, senna, sodium chloride flush, sodium chloride flush    Patient Profile   [name] is a 82 y.o. male with h/o mechanical AVR 1994, OSA on CPAP, Chronic afib, chronic combined CHF, CKD III, chronic coumadin therapy, CAD s/p CAGB 1994, and COPD.   Admitted from [facility] office 10/18/17 with Acute on chronic diastolic CHF.   Assessment/Plan   1. Acute on chronic diastolic CHF with prominent RV failure: - Echo this admission showed EF 55-65%, mechanical aortic valve functioning normally, mild mitral stenosis/severe mitral regurgitation, RV moderately dilated with moderately decreased systolic function, PASP 49 mmHg.  Severe MR could be contributing to the significant RV failure.  - Overall weight down 53 pounds. Volume status still looks elevated.  - CVP 15-16. Continue lasix @ 15 mg/hr. Repeat diamox and metolazone again today.  - Continue spironolactone 12.5 mg daily.  - Decrease coreg to 3.125 mg BID to allow room for BP.  3. Mechanical aortic valve: Appears to function well on echo this admission.  - Continue warfarin with INR goal 2.5-3.5. - INR 1.93  Discussed dosing with PharmD personally. 4. Atrial fibrillation: Chronic.  - Rate controlled 60-70s. Decrease coreg to 3.125 mg BID to allow BP room for diuresis.  - INR 1.93 Continue warfarin. Keep INR closer 2.5 with nose bleed. Discussed dosing with PharmD personally. 5. CAD: s/p CABG.  - No s/s ischemia - Continue Vytorin.  6. COPD:  - On home oxygen. No change.   7. OSA:  - Continue home CPAP. No change.  8. Acute on CKD: Stage 3.   - Continue lasix drip. Creatinine improving 1.38 > 1.48 > 1.28 > 1.38 9. Constipation - Resolved. No change.  10. Deconditioning - He  will need HH PT/OT and 24 hour supervision. No change.  11. Epistaxis - Resolved. No change.   12. Anemia  - Hemoglobin 8.3. Iron studies okay. Hemoccult pending 13. Thrombocytopenia: This appears chronic, had before this admission. No change.   14. Severe MR - TEE with severe MR with possible rheumatic MV (do not think MS is significant, mildly elevated mean gradient from high flow with severe MR): Structural Heart evaluated. It does not appear that he will be a Mitraclip candidate. Hs been seen by Dr. [name] who recommended aggressive medical therapy versus possible experimental percutaneous MVR  as he felt very high risk for conventional surgery  15. Hypokalemia - K 4.4  Length of Stay: [address] NP  10/31/2017, 8:48 AM  Advanced Heart Failure Team Pager [phone] (M-F; [place])  Please contact [facility] for night-coverage after hours (4p -7a ) and weekends on [email or web address]  Patient seen with NP, agree with the above note.    He diuresed well again yesterday, CVP down to 15.  SBP generally in 80s-90s, no lightheadedness.  Walked to the nursing desk today without significant dyspnea. Creatinine stable at 1.34. HCO3 44.   On exam, he still appears volume overloaded with JVP 12 cm and peripheral edema. However, his weight is down 60 lbs.   Continue diuresis with Lasix 15 mg/hr + metolazone 2.5 x 1 + acetazolamide 250 mg bid with elevated HCO3. Follow BUN/creatinine closely. I think he needs another 1-2 days of diuresis.   With soft BP, we have decreased Coreg to 3.125 mg bid and spironolactone to 12.5 mg daily.  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 Mitral stenosis is minimal. We sent off his TEE for Mitraclip evaluation, but it appears his MVA may be too small for successful clip (<3.0 cm^2). If he cannot get Mitraclip, options will be ongoing medical management versus MV surgery versus transcatheter MVR within clinical trial. Dr. [name] has seen, surgical risk  very high and he would be reluctant to operate in this situation. I think at this point it may be best to medically optimize him and then look for a trial site for transcatheter MVR.   Loralie Champagne 10/31/2017 1:43 PM

## 2017-10-31 NOTE — Progress Notes (Signed)
ANTICOAGULATION CONSULT NOTE - Follow-up Consult  Pharmacy Consult for Coumadin Indication: mechanical aortic valve + Afib  Allergies  Allergen Reactions  . Rifampin Rash    May have been caused by Vancomycin or Rifampin (??)  . Vancomycin Rash    May have been caused by Vancomycin or Rifampin (??)    Patient Measurements: Height: 6\' 2"  (188 cm) Weight: 239 lb 9.6 oz (108.7 kg) IBW/kg (Calculated) : 82.2 Heparin Dosing Weight: n/a   Vital Signs: Temp: 98.1 F (36.7 C) (07/31 0814) Temp Source: Oral (07/31 0814) BP: 97/62 (07/31 0814) Pulse Rate: 72 (07/31 0814)  Labs: Recent Labs    10/29/17 0500 10/29/17 1509 10/30/17 0527 10/30/17 2751 10/30/17 2306 10/31/17 0346 10/31/17 0438  HGB 8.1*  --  7.9*  --   --  8.3*  --   HCT 26.4*  --  26.3*  --   --  26.7*  --   PLT 86*  --  72*  --   --  76*  --   LABPROT 26.4*  --  25.9*  --   --   --  21.9*  INR 2.46  --  2.39  --   --   --  1.93  CREATININE 1.38* 1.30*  --  1.28* 1.34*  --   --     Estimated Creatinine Clearance: 57.7 mL/min (A) (by C-G formula based on SCr of 1.34 mg/dL (H)).   Medical History: Past Medical History:  Diagnosis Date  . Carotid artery disease ([place]) 1994   s/p left carotid endarerectomy   . Chronic atrial fibrillation ([facility])    a. on coumadin   . Chronic diastolic CHF (congestive heart failure) ([place])   . Hx of CABG    a. 1994  . Hypertension   . OSA (obstructive sleep apnea)   . Rheumatic fever   . S/P AVR (aortic valve replacement)    a. mechanical valve 1996  . Subclavian bypass stenosis ([place])   . Vitamin B12 deficiency    Assessment: 82 yo male on chronic Coumadin for mechanical aortic valve + afib. PTA Coumadin dose 5 mg daily except 2.5 mg on Mon, Fri.  INR subtherapeutic and decreased from 2.39 to 1.93. Will hold on bridging given thrombocytopenia and will increase warfarin dosing. Hgb stable low at 8.3, plt 76 (chronically low). Due to nose bleed noted during admission, plan  to target INR goal closer to 2.5 per MD. No new medication interactions. 100% intake for meal yesterday.  Goal of Therapy:  INR 2.5-3.5 Monitor platelets by anticoagulation protocol: Yes   Plan:  1. Warfarin 7.5 mg x 1 tonight 2. Monitor daily PT/INR, CBC and s/s of bleeding   Doylene Canard, PharmD Clinical Pharmacist  Pager: [phone] Phone: [phone] 10/31/2017   8:57 AM

## 2017-10-31 NOTE — Telephone Encounter (Deleted)
Copied from [place] [id number]. Topic: Inquiry >> Oct 31, 2017  8:44 AM Vernona Rieger wrote: Reason for CRM: Patient's wife [name] called and wanted to let Dr Alain Marion that he is in the hospital and has been there for two weeks. His heart doctor, Dr Tamala Julian put him in the hospital.

## 2017-10-31 NOTE — Telephone Encounter (Signed)
error 

## 2017-10-31 NOTE — Progress Notes (Signed)
Patient refused CPAP HS. Patient in no distress at this time. 

## 2017-10-31 NOTE — Progress Notes (Signed)
Patient refused CPAP tonight. RT will monitor as needed.

## 2017-10-31 NOTE — Progress Notes (Addendum)
Paged cardiology for low BP  Spoke with on call cardiology. NP okay with BP as long as MAP 60 or above. Last BP 87/48 (60)

## 2017-11-01 LAB — BASIC METABOLIC PANEL
ANION GAP: 15 (ref 5–15)
Anion gap: 14 (ref 5–15)
BUN: 48 mg/dL — AB (ref 8–23)
BUN: 46 mg/dL — ABNORMAL HIGH (ref 8–23)
CALCIUM: 9.4 mg/dL (ref 8.9–10.3)
CALCIUM: 9.2 mg/dL (ref 8.9–10.3)
CO2: 40 mmol/L — ABNORMAL HIGH (ref 22–32)
CO2: 38 mmol/L — ABNORMAL HIGH (ref 22–32)
Chloride: 82 mmol/L — ABNORMAL LOW (ref 98–111)
Chloride: 83 mmol/L — ABNORMAL LOW (ref 98–111)
Creatinine, Ser: 1.34 mg/dL — ABNORMAL HIGH (ref 0.61–1.24)
Creatinine, Ser: 1.46 mg/dL — ABNORMAL HIGH (ref 0.61–1.24)
GFR calc Af Amer: 56 mL/min — ABNORMAL LOW (ref >60)
GFR, EST AFRICAN AMERICAN: 51 mL/min — AB (ref >60)
GFR, EST NON AFRICAN AMERICAN: 48 mL/min — AB (ref >60)
GFR, EST NON AFRICAN AMERICAN: 44 mL/min — AB (ref >60)
GLUCOSE: 107 mg/dL — AB (ref 70–99)
GLUCOSE: 158 mg/dL — AB (ref 70–99)
POTASSIUM: 2.9 mmol/L — AB (ref 3.5–5.1)
POTASSIUM: 3.3 mmol/L — AB (ref 3.5–5.1)
SODIUM: 136 mmol/L (ref 135–145)
Sodium: 136 mmol/L (ref 135–145)

## 2017-11-01 LAB — PROTIME-INR
INR: 1.69
Prothrombin Time: 19.7 s — ABNORMAL HIGH (ref 11.4–15.2)

## 2017-11-01 LAB — CBC
HCT: 33.6 % — ABNORMAL LOW (ref 39.0–52.0)
Hemoglobin: 10.4 g/dL — ABNORMAL LOW (ref 13.0–17.0)
MCH: 33.3 pg (ref 26.0–34.0)
MCHC: 31 g/dL (ref 30.0–36.0)
MCV: 107.7 fL — ABNORMAL HIGH (ref 78.0–100.0)
Platelets: 82 10*3/uL — ABNORMAL LOW (ref 150–400)
RBC: 3.12 MIL/uL — ABNORMAL LOW (ref 4.22–5.81)
RDW: 16.8 % — ABNORMAL HIGH (ref 11.5–15.5)
WBC: 2.9 10*3/uL — ABNORMAL LOW (ref 4.0–10.5)

## 2017-11-01 LAB — GLUCOSE, CAPILLARY
GLUCOSE-CAPILLARY: 94 mg/dL (ref 70–99)
Glucose-Capillary: 116 mg/dL — ABNORMAL HIGH (ref 70–99)
Glucose-Capillary: 130 mg/dL — ABNORMAL HIGH (ref 70–99)
Glucose-Capillary: 128 mg/dL — ABNORMAL HIGH (ref 70–99)

## 2017-11-01 LAB — HEPARIN LEVEL (UNFRACTIONATED)
HEPARIN UNFRACTIONATED: 1.88 [IU]/mL — AB (ref 0.30–0.70)
HEPARIN UNFRACTIONATED: 0.52 [IU]/mL (ref 0.30–0.70)

## 2017-11-01 MED ORDER — METOLAZONE 5 MG PO TABS
2.5000 mg | ORAL_TABLET | Freq: Once | ORAL | Status: AC
  Administered 2017-11-01: 2.5 mg via ORAL
  Filled 2017-11-01: qty 1

## 2017-11-01 MED ORDER — POTASSIUM CHLORIDE CRYS ER 20 MEQ PO TBCR
60.0000 meq | EXTENDED_RELEASE_TABLET | Freq: Once | ORAL | Status: AC
  Administered 2017-11-01: 60 meq via ORAL
  Filled 2017-11-01: qty 3

## 2017-11-01 MED ORDER — POTASSIUM CHLORIDE CRYS ER 20 MEQ PO TBCR
30.0000 meq | EXTENDED_RELEASE_TABLET | Freq: Once | ORAL | Status: AC
  Administered 2017-11-01: 30 meq via ORAL
  Filled 2017-11-01: qty 1

## 2017-11-01 MED ORDER — WARFARIN SODIUM 7.5 MG PO TABS
7.5000 mg | ORAL_TABLET | Freq: Once | ORAL | Status: AC
  Administered 2017-11-01: 7.5 mg via ORAL
  Filled 2017-11-01: qty 1

## 2017-11-01 MED ORDER — HEPARIN (PORCINE) IN NACL 100-0.45 UNIT/ML-% IJ SOLN
1250.0000 [IU]/h | INTRAMUSCULAR | Status: DC
  Administered 2017-11-01 – 2017-11-03 (×3): 1250 [IU]/h via INTRAVENOUS
  Filled 2017-11-01 (×3): qty 250

## 2017-11-01 NOTE — Progress Notes (Addendum)
Patient ID: [name], male   DOB: 30-Jun-1935, 82 y.o.   MRN: [id number]     Advanced Heart Failure Rounding Note  PCP-Cardiologist: Belva Crome III, MD   Subjective:    Weight down another 11 more lbs with -4.5 L. (71 pounds total). Remains on lasix drip + diamox + metolazone.  Creatinine 1.2->1.5 ->1.38 -> 1.28 -> 1.34 -> 1.34, K 2.9. CVP 17. CO2 40. SBP 78-100s.   INR 1.67   Denies CP, SOB, orthopnea, or dizziness. Walking in halls without problems other than feeling weak.   TEE (7/23): showed EF 55-60%, mildly dilated/dysfunctional RV, severe MR likely mixed primary and secondary etiology with minimal mitral stenosis.  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   Objective:   Weight Range: 228 lb 8 oz (103.6 kg) Body mass index is 29.34 kg/m.   Vital Signs:   Temp:  [97.9 F (36.6 C)-98.5 F (36.9 C)] 97.9 F (36.6 C) (08/01 0744) Pulse Rate:  [66-84] 70 (08/01 0744) Resp:  [18] 18 (07/31 1629) BP: (78-103)/(51-66) 103/66 (08/01 0744) SpO2:  [94 %-100 %] 98 % (08/01 0744) Weight:  [228 lb 8 oz (103.6 kg)] 228 lb 8 oz (103.6 kg) (08/01 0500) Last BM Date: 10/31/17  Weight change: Filed Weights   10/30/17 0356 10/31/17 0446 11/01/17 0500  Weight: 246 lb 4.8 oz (111.7 kg) 239 lb 9.6 oz (108.7 kg) 228 lb 8 oz (103.6 kg)   Intake/Output:   Intake/Output Summary (Last 24 hours) at 11/01/2017 0807 Last data filed at 11/01/2017 0700 Gross per 24 hour  Intake 1571.31 ml  Output 6075 ml  Net -4503.69 ml    Physical Exam    CVP 17 General: No resp difficulty. HEENT: Normal Neck: Supple. JVP elevated. Carotids 2+ bilat; no bruits. No thyromegaly or nodule noted. Cor: PMI nondisplaced. IRR, mechanical S2, 2/6 SEM at RUSB Lungs: diminished Abdomen: Soft, non-tender, non-distended, no HSM. No bruits or masses. +BS  Extremities: No cyanosis, clubbing, or rash. R and LLE 1+ edema. BLE unna boots. RUE PICC Neuro: Alert & orientedx3, cranial nerves grossly intact. moves  all 4 extremities w/o difficulty. Affect pleasant  Telemetry   Afib 70s. Personally reviewed.   Labs    CBC Recent Labs    10/31/17 0346 11/01/17 0404  WBC 4.3 2.9*  HGB 8.3* 10.4*  HCT 26.7* 33.6*  MCV 109.9* 107.7*  PLT 76* 82*   Basic Metabolic Panel Recent Labs    10/30/17 0651 10/30/17 2306 11/01/17 0404  NA 138 138 136  K 3.2* 4.4 2.9*  CL 83* 81* 82*  CO2 41* 44* 40*  GLUCOSE 112* 121* 107*  BUN 44* 46* 48*  CREATININE 1.28* 1.34* 1.34*  CALCIUM 9.6 9.7 9.4  MG 2.2  --   --    Liver Function Tests No results for input(s): AST, ALT, ALKPHOS, BILITOT, PROT, ALBUMIN in the last 72 hours. No results for input(s): LIPASE, AMYLASE in the last 72 hours. Cardiac Enzymes No results for input(s): CKTOTAL, CKMB, CKMBINDEX, TROPONINI in the last 72 hours.  BNP: BNP (last 3 results) Recent Labs    08/01/17 1804 10/18/17 1510  BNP 784.4* 1,073.3*    ProBNP (last 3 results) Recent Labs    03/30/17 1147 05/25/17 1044 07/10/17 1523  PROBNP 1,858* 2,749* 411.0*     D-Dimer No results for input(s): DDIMER in the last 72 hours. Hemoglobin A1C No results for input(s): HGBA1C in the last 72 hours. Fasting Lipid Panel No results for input(s): CHOL, HDL, LDLCALC,  TRIG, CHOLHDL, LDLDIRECT in the last 72 hours. Thyroid Function Tests No results for input(s): TSH, T4TOTAL, T3FREE, THYROIDAB in the last 72 hours.  Invalid input(s): FREET3  Other results:   Imaging    No results found.   Medications:     Scheduled Medications: . acetaZOLAMIDE  250 mg Oral BID  . carvedilol  3.125 mg Oral BID WC  . docusate sodium  100 mg Oral BID  . ezetimibe-simvastatin  1 tablet Oral QHS  . folic acid  1 mg Oral Daily  . insulin aspart  0-15 Units Subcutaneous TID WC  . potassium chloride  60 mEq Oral BID  . sodium chloride flush  3 mL Intravenous Q12H  . spironolactone  12.5 mg Oral Daily  . vitamin B-12  3,000 mcg Oral Daily  . Warfarin - Pharmacist Dosing  Inpatient   Does not apply q1800    Infusions: . sodium chloride    . furosemide (LASIX) infusion 15 mg/hr (11/01/17 0700)    PRN Medications: sodium chloride, acetaminophen, fluocinonide cream, ipratropium-albuterol, ondansetron (ZOFRAN) IV, oxymetazoline, senna, sodium chloride flush, sodium chloride flush    Patient Profile   [name] is a 82 y.o. male with h/o mechanical AVR 1994, OSA on CPAP, Chronic afib, chronic combined CHF, CKD III, chronic coumadin therapy, CAD s/p CAGB 1994, and COPD.   Admitted from [facility] office 10/18/17 with Acute on chronic diastolic CHF.   Assessment/Plan   1. Acute on chronic diastolic CHF with prominent RV failure: - Echo this admission showed EF 55-65%, mechanical aortic valve functioning normally, mild mitral stenosis/severe mitral regurgitation, RV moderately dilated with moderately decreased systolic function, PASP 49 mmHg.  Severe MR could be contributing to the significant RV failure.  - Overall weight down 71 pounds. Volume status remains elevated - CVP 17-18. Continue lasix @ 15 mg/hr. Repeat diamox and metolazone again today.  - Continue spironolactone 12.5 mg daily.  - Continue coreg to 3.125 mg BID. SBP 78-100s. Denies dizziness. 3. Mechanical aortic valve: Appears to function well on echo this admission.  - Continue warfarin with INR goal 2.5-3.5. - INR 1.69  Discussed dosing with PharmD personally. Start heparin drip today 4. Atrial fibrillation: Chronic.  - Rate controlled 60-70s. Decrease coreg to 3.125 mg BID to allow BP room for diuresis.  - INR 1.69 Continue warfarin. Keep INR closer 2.5 with nose bleed. Discussed dosing with PharmD personally. Start heparin drip today.  5. CAD: s/p CABG.  - No s/s ischemia - Continue Vytorin.  6. COPD:  - On home oxygen. No change.  7. OSA:  - Continue home CPAP. No change 8. Acute on CKD: Stage 3.   - Continue lasix drip. Creatinine improving 1.38 > 1.48 > 1.28 > 1.38 > 1.34 9.  Constipation - Resolved. No change.  10. Deconditioning - He will need HH PT/OT and 24 hour supervision. No change 11. Epistaxis - Resolved. No change   12. Anemia  - Hemoglobin 10.4?Marland Kitchen Iron studies okay. Hemoccult pending 13. Thrombocytopenia: This appears chronic, had before this admission. No change.  14. Severe MR - TEE with severe MR with possible rheumatic MV (do not think MS is significant, mildly elevated mean gradient from high flow with severe MR): Structural Heart evaluated. It does not appear that he will be a Mitraclip candidate. Hs been seen by Dr. [name] who recommended aggressive medical therapy versus possible experimental percutaneous MVR as he felt very high risk for conventional surgery. No change 15. Hypokalemia - K 2.9. Will  supp.   Length of Stay: [place], NP  11/01/2017, 8:07 AM  Advanced Heart Failure Team Pager [phone] (M-F; 7a - 4p)  Please contact [facility] for night-coverage after hours (4p -7a ) and weekends on [email or web address]  Patient seen with NP, agree with the above note.    He diuresed well again yesterday, CVP still 17 however. SBP generally in 80s-90s, no lightheadedness. Walking in halls without significant dyspnea, says ankles feel weak. Creatinine stable at 1.34. HCO3 40.   On exam, he still appears volume overloaded with JVP 12 cm and peripheral edema. However, his weight is down70lbs.   Continue diuresis withLasix 15 mg/hr + metolazone 2.5 x 1 + acetazolamide 250 mg bid withelevatedHCO3. Follow BUN/creatinine closely but stable so far. Still needs further diuresis as long as creatinine is stable, would like to see CVP down to around 10.    With soft BP, we have decreased Coreg to 3.125 mg bid and spironolactone to 12.5 mg daily.  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 Mitral stenosis is minimal. We sent off his TEE for Mitraclip evaluation, but it appears his MVA may be too small for successful clip (<3.0  cm^2). If he cannot get Mitraclip, options will be ongoing medical management versus MV surgery versus transcatheter MVR within clinical trial. Dr. [name] has seen, surgical risk very high and he would be reluctant to operate in this situation. I think at this point it may be best to medically optimize him and then look for a trial site for transcatheter MVR.   Loralie Champagne 11/01/2017 8:36 AM

## 2017-11-01 NOTE — Progress Notes (Signed)
ANTICOAGULATION CONSULT NOTE - Follow-up Consult  Pharmacy Consult for heparin Indication: mechanical aortic valve + Afib  Allergies  Allergen Reactions  . Rifampin Rash    May have been caused by Vancomycin or Rifampin (??)  . Vancomycin Rash    May have been caused by Vancomycin or Rifampin (??)    Patient Measurements: Height: 6\' 2"  (188 cm) Weight: 228 lb 8 oz (103.6 kg) IBW/kg (Calculated) : 82.2 Heparin Dosing Weight: n/a   Vital Signs: Temp: 98 F (36.7 C) (08/01 1601) Temp Source: Oral (08/01 1601) BP: 109/61 (08/01 1601) Pulse Rate: 71 (08/01 1601)  Labs: Recent Labs    10/30/17 0527  10/30/17 2306 10/31/17 0346 10/31/17 0438 11/01/17 0404 11/01/17 1416 11/01/17 1648 11/01/17 1843  HGB 7.9*  --   --  8.3*  --  10.4*  --   --   --   HCT 26.3*  --   --  26.7*  --  33.6*  --   --   --   PLT 72*  --   --  76*  --  82*  --   --   --   LABPROT 25.9*  --   --   --  21.9* 19.7*  --   --   --   INR 2.39  --   --   --  1.93 1.69  --   --   --   HEPARINUNFRC  --   --   --   --   --   --   --  1.88* 0.52  CREATININE  --    < > 1.34*  --   --  1.34* 1.46*  --   --    < > = values in this interval not displayed.    Estimated Creatinine Clearance: 51.8 mL/min (A) (by C-G formula based on SCr of 1.46 mg/dL (H)).   Medical History: Past Medical History:  Diagnosis Date  . Carotid artery disease ([place]) 1994   s/p left carotid endarerectomy   . Chronic atrial fibrillation (HCC)    a. on coumadin   . Chronic diastolic CHF (congestive heart failure) ([place])   . Hx of CABG    a. 1994  . Hypertension   . OSA (obstructive sleep apnea)   . Rheumatic fever   . S/P AVR (aortic valve replacement)    a. mechanical valve 1996  . Subclavian bypass stenosis ([place])   . Vitamin B12 deficiency    Assessment: 82 yo male on chronic Coumadin for mechanical aortic valve + afib. PTA Coumadin dose 5 mg daily except 2.5 mg on Mon, Fri.  INR subtherapeutic and decreased further from  1.93 to 1.69. Given continued decrease in INR, will add heparin bridge today.   Initial heparin level therapeutic at 0.52, will recheck level with labs.  Goal of Therapy:  Heparin level: 0.3-0.7  INR 2.5-3.5 Monitor platelets by anticoagulation protocol: Yes   Plan:  -Continue IV heparin 1250 units/hr -Recheck confirmatory heparin level with morning labs  Arrie Senate, PharmD, BCPS Clinical Pharmacist [phone] Please check AMION for all [place] numbers 11/01/2017

## 2017-11-01 NOTE — Progress Notes (Signed)
CARDIAC REHAB PHASE I   PRE:  Rate/Rhythm: 70 afib  BP:  Supine: 82/55  Sitting: 79/53  Standing:    SaO2: 96% 2L   MODE:  Ambulation: chair ft  [phone] Came to see pt to walk. BP low as documented. Had pt sit on edge of bed and bed is soaked . Had pt stand to wash legs and bottom. Then pivoted to chair so linen can be changed. PT to check on pt later.  Pt c/o feeling tired. NT in with pt after we left.        Graylon Good, RN BSN  11/01/2017 12:00 PM

## 2017-11-01 NOTE — Progress Notes (Signed)
ANTICOAGULATION CONSULT NOTE - Follow-up Consult  Pharmacy Consult for Coumadin + heparin Indication: mechanical aortic valve + Afib  Allergies  Allergen Reactions  . Rifampin Rash    May have been caused by Vancomycin or Rifampin (??)  . Vancomycin Rash    May have been caused by Vancomycin or Rifampin (??)    Patient Measurements: Height: 6\' 2"  (188 cm) Weight: 228 lb 8 oz (103.6 kg) IBW/kg (Calculated) : 82.2 Heparin Dosing Weight: n/a   Vital Signs: Temp: 97.9 F (36.6 C) (08/01 0744) Temp Source: Oral (08/01 0744) BP: 103/66 (08/01 0744) Pulse Rate: 70 (08/01 0744)  Labs: Recent Labs    10/30/17 0527 10/30/17 5248 10/30/17 2306 10/31/17 0346 10/31/17 0438 11/01/17 0404  HGB 7.9*  --   --  8.3*  --  10.4*  HCT 26.3*  --   --  26.7*  --  33.6*  PLT 72*  --   --  76*  --  82*  LABPROT 25.9*  --   --   --  21.9* 19.7*  INR 2.39  --   --   --  1.93 1.69  CREATININE  --  1.28* 1.34*  --   --  1.34*    Estimated Creatinine Clearance: 56.5 mL/min (A) (by C-G formula based on SCr of 1.34 mg/dL (H)).   Medical History: Past Medical History:  Diagnosis Date  . Carotid artery disease ([place]) 1994   s/p left carotid endarerectomy   . Chronic atrial fibrillation (HCC)    a. on coumadin   . Chronic diastolic CHF (congestive heart failure) ([place])   . Hx of CABG    a. 1994  . Hypertension   . OSA (obstructive sleep apnea)   . Rheumatic fever   . S/P AVR (aortic valve replacement)    a. mechanical valve 1996  . Subclavian bypass stenosis ([place])   . Vitamin B12 deficiency    Assessment: 82 yo male on chronic Coumadin for mechanical aortic valve + afib. PTA Coumadin dose 5 mg daily except 2.5 mg on Mon, Fri.  INR subtherapeutic and decreased further from 1.93 to 1.69. Given continued decrease in INR, will add heparin bridge today.   Hgb stable at 10.4, plt 82 (chronically low). Due to nose bleed noted during admission, plan to target INR goal closer to 2.5 per MD. No  new medication interactions. 100% intake for meal yesterday.  Goal of Therapy:  Heparin level: 0.3-0.7  INR 2.5-3.5 Monitor platelets by anticoagulation protocol: Yes   Plan:  1. Warfarin 7.5 mg x 1 tonight 2. Start heparin infusion at 1250 units/hr and obtain heparin level in 8 hours 2. Monitor daily PT/INR, HL, CBC and s/s of bleeding   Doylene Canard, PharmD Clinical Pharmacist  Pager: [phone] Phone: [phone] 11/01/2017   8:10 AM

## 2017-11-01 NOTE — Progress Notes (Signed)
Patient refuses CPAP. RT will monitor as needed.

## 2017-11-01 NOTE — Progress Notes (Signed)
Physical Therapy Treatment Patient Details Name: [name] MRN: [id number] DOB: 21-Sep-1935 Today's Date: 11/01/2017    History of Present Illness 82 y.o. male with PMH of mechanical aortic valve (1994), OSA on CPAP, a-fib, HF, CKD III,  prior CABG, and COPD, admitted 10/18/17 with progressive abdominal and LE swelling with anasarca. Worked up for acute on chronic diastolic CHF with prominent RV failure. Recommended aggressive medical therapy vs possible experimental percutaneous MVR as pt considered very high risk for conventional sx.     PT Comments    Pt limited today by bilateral foot pain/tenderness.    Follow Up Recommendations  Home health PT;Supervision/Assistance - 24 hour(possibly LTACH for medical issues)     Equipment Recommendations  None recommended by PT    Recommendations for Other Services       Precautions / Restrictions Precautions Precautions: Fall Precaution Comments: 2 months ago tripped on throw rug, all throw rugs gone from home Restrictions Weight Bearing Restrictions: No    Mobility  Bed Mobility Overal bed mobility: Needs Assistance Bed Mobility: Supine to Sit;Sit to Supine     Supine to sit: HOB elevated;Min guard Sit to supine: Min assist   General bed mobility comments: Assist to bring feet back up into bed  Transfers Overall transfer level: Needs assistance Equipment used: Rolling walker (2 wheeled) Transfers: Sit to/from Stand Sit to Stand: Supervision         General transfer comment: supervision for safety and lines  Ambulation/Gait Ambulation/Gait assistance: Min guard Gait Distance (Feet): 50 Feet Assistive device: 4-wheeled walker Gait Pattern/deviations: Step-through pattern;Decreased step length - right;Decreased step length - left;Antalgic Gait velocity: decr Gait velocity interpretation: <1.8 ft/sec, indicate of risk for recurrent falls General Gait Details: Assist for safety. Distance limited by painful  feet.   Stairs             Wheelchair Mobility    Modified Rankin (Stroke Patients Only)       Balance Overall balance assessment: Needs assistance Sitting-balance support: Feet supported;No upper extremity supported Sitting balance-Leahy Scale: Good     Standing balance support: During functional activity;Bilateral upper extremity supported Standing balance-Leahy Scale: Poor Standing balance comment: UE support due to painful feet                            Cognition Arousal/Alertness: Awake/alert Behavior During Therapy: WFL for tasks assessed/performed;Flat affect Overall Cognitive Status: Within Functional Limits for tasks assessed                                        Exercises      General Comments General comments (skin integrity, edema, etc.): O2 at 2L      Pertinent Vitals/Pain Pain Assessment: Faces Faces Pain Scale: Hurts even more Pain Location: bilateral feet Pain Descriptors / Indicators: Grimacing;Guarding Pain Intervention(s): Limited activity within patient's tolerance;Monitored during session;Repositioned    Home Living                      Prior Function            PT Goals (current goals can now be found in the care plan section) Progress towards PT goals: Not progressing toward goals - comment(due to foot pain today)    Frequency    Min 3X/week      PT Plan Current plan  remains appropriate;Discharge plan needs to be updated    Co-evaluation              AM-PAC PT "6 Clicks" Daily Activity  Outcome Measure  Difficulty turning over in bed (including adjusting bedclothes, sheets and blankets)?: A Little Difficulty moving from lying on back to sitting on the side of the bed? : A Little Difficulty sitting down on and standing up from a chair with arms (e.g., wheelchair, bedside commode, etc,.)?: A Little Help needed moving to and from a bed to chair (including a wheelchair)?: A  Little Help needed walking in hospital room?: A Little Help needed climbing 3-5 steps with a railing? : A Little 6 Click Score: 18    End of Session Equipment Utilized During Treatment: Gait belt;Oxygen Activity Tolerance: Patient limited by pain Patient left: in bed;with call bell/phone within reach Nurse Communication: Mobility status PT Visit Diagnosis: Unsteadiness on feet (R26.81);Other abnormalities of gait and mobility (R26.89);Muscle weakness (generalized) (M62.81);History of falling (Z91.81)     Time: 0258-5277 PT Time Calculation (min) (ACUTE ONLY): 24 min  Charges:  $Gait Training: 23-37 mins                     [place] 11/01/2017, 4:14 PM

## 2017-11-02 LAB — BASIC METABOLIC PANEL
ANION GAP: 13 (ref 5–15)
Anion gap: 11 (ref 5–15)
BUN: 48 mg/dL — AB (ref 8–23)
BUN: 50 mg/dL — ABNORMAL HIGH (ref 8–23)
CALCIUM: 9.2 mg/dL (ref 8.9–10.3)
CHLORIDE: 85 mmol/L — AB (ref 98–111)
CHLORIDE: 87 mmol/L — AB (ref 98–111)
CO2: 39 mmol/L — ABNORMAL HIGH (ref 22–32)
CO2: 37 mmol/L — AB (ref 22–32)
CREATININE: 1.39 mg/dL — AB (ref 0.61–1.24)
Calcium: 9.1 mg/dL (ref 8.9–10.3)
Creatinine, Ser: 1.41 mg/dL — ABNORMAL HIGH (ref 0.61–1.24)
GFR calc Af Amer: 54 mL/min — ABNORMAL LOW (ref >60)
GFR calc Af Amer: 53 mL/min — ABNORMAL LOW (ref >60)
GFR calc non Af Amer: 46 mL/min — ABNORMAL LOW (ref >60)
GFR calc non Af Amer: 45 mL/min — ABNORMAL LOW (ref >60)
Glucose, Bld: 166 mg/dL — ABNORMAL HIGH (ref 70–99)
Glucose, Bld: 110 mg/dL — ABNORMAL HIGH (ref 70–99)
POTASSIUM: 2.9 mmol/L — AB (ref 3.5–5.1)
POTASSIUM: 3 mmol/L — AB (ref 3.5–5.1)
SODIUM: 135 mmol/L (ref 135–145)
Sodium: 137 mmol/L (ref 135–145)

## 2017-11-02 LAB — GLUCOSE, CAPILLARY
GLUCOSE-CAPILLARY: 111 mg/dL — AB (ref 70–99)
GLUCOSE-CAPILLARY: 128 mg/dL — AB (ref 70–99)
GLUCOSE-CAPILLARY: 104 mg/dL — AB (ref 70–99)
GLUCOSE-CAPILLARY: 116 mg/dL — AB (ref 70–99)

## 2017-11-02 LAB — PROTIME-INR
INR: 1.84
PROTHROMBIN TIME: 21.1 s — AB (ref 11.4–15.2)

## 2017-11-02 LAB — CBC
HEMATOCRIT: 26.3 % — AB (ref 39.0–52.0)
HEMOGLOBIN: 8 g/dL — AB (ref 13.0–17.0)
MCH: 33.2 pg (ref 26.0–34.0)
MCHC: 30.4 g/dL (ref 30.0–36.0)
MCV: 109.1 fL — ABNORMAL HIGH (ref 78.0–100.0)
Platelets: 113 10*3/uL — ABNORMAL LOW (ref 150–400)
RBC: 2.41 MIL/uL — ABNORMAL LOW (ref 4.22–5.81)
RDW: 16.9 % — AB (ref 11.5–15.5)
WBC: 4.2 10*3/uL (ref 4.0–10.5)

## 2017-11-02 LAB — HEPARIN LEVEL (UNFRACTIONATED): Heparin Unfractionated: 0.55 IU/mL (ref 0.30–0.70)

## 2017-11-02 MED ORDER — TORSEMIDE 20 MG PO TABS
80.0000 mg | ORAL_TABLET | Freq: Two times a day (BID) | ORAL | Status: DC
  Administered 2017-11-02: 80 mg via ORAL
  Filled 2017-11-02: qty 4

## 2017-11-02 MED ORDER — CARVEDILOL 3.125 MG PO TABS: 3.1250 mg | ORAL_TABLET | Freq: Two times a day (BID) | ORAL | Status: DC

## 2017-11-02 MED ORDER — POTASSIUM CHLORIDE 20 MEQ/15ML (10%) PO SOLN
40.0000 meq | Freq: Once | ORAL | Status: AC
  Administered 2017-11-02: 40 meq via ORAL
  Filled 2017-11-02: qty 30

## 2017-11-02 MED ORDER — WARFARIN SODIUM 5 MG PO TABS
5.0000 mg | ORAL_TABLET | Freq: Once | ORAL | Status: AC
  Administered 2017-11-02: 5 mg via ORAL
  Filled 2017-11-02: qty 1

## 2017-11-02 MED ORDER — TORSEMIDE 20 MG PO TABS: 80.0000 mg | ORAL_TABLET | Freq: Two times a day (BID) | ORAL | Status: DC

## 2017-11-02 NOTE — Progress Notes (Signed)
Received page from RN that pt was symptomatic with SBP 60-70s.   Pt now in bed and feeling better. States that he felt very weak trying to get out of chair. Denied any dizziness or lightheadedness. SBP still low in 70s. CVP is 9. Will hold next dose of coreg and torsemide. Discussed with [name]. Pt instructed to let RN know if he starts to feel badly again. Also c/o right ankle pain. Unna boots in place, so unable to assess thoroughly. Will check uric acid in the am.   Georgiana Shore, NP

## 2017-11-02 NOTE — Progress Notes (Addendum)
Patient ID: [name], male   DOB: [birth year], 82 y.o.   MRN: [id number]     Advanced Heart Failure Rounding Note  PCP-Cardiologist: Belva Crome III, MD   Subjective:    Weight down another 7 more lbs with -1.9 L (78 pounds total). Remains on lasix drip + diamox + metolazone. CVP ~9  Creatinine 1.2->1.5 ->1.38 -> 1.28 -> 1.34 -> 1.34 -> BMET pending. SBP 80-90s  INR 1.84, hemoglobin 8.0. On heparin drip + coumadin.    Denies CP, SOB, bleeding, or dizziness.   TEE (7/23): showed EF 55-60%, mildly dilated/dysfunctional RV, severe MR likely mixed primary and secondary etiology with minimal mitral stenosis.  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   Objective:   Weight Range: 221 lb 1.6 oz (100.3 kg) Body mass index is 28.39 kg/m.   Vital Signs:   Temp:  [97.5 F (36.4 C)-99 F (37.2 C)] 97.5 F (36.4 C) (08/02 0448) Pulse Rate:  [60-77] 77 (08/02 0448) Resp:  [16-18] 18 (08/02 0448) BP: (84-109)/(55-63) 93/63 (08/02 0448) SpO2:  [94 %-99 %] 94 % (08/02 0448) Weight:  [221 lb 1.6 oz (100.3 kg)] 221 lb 1.6 oz (100.3 kg) (08/02 0448) Last BM Date: 10/31/17  Weight change: Filed Weights   10/31/17 0446 11/01/17 0500 11/02/17 0448  Weight: 239 lb 9.6 oz (108.7 kg) 228 lb 8 oz (103.6 kg) 221 lb 1.6 oz (100.3 kg)   Intake/Output:   Intake/Output Summary (Last 24 hours) at 11/02/2017 0753 Last data filed at 11/02/2017 0458 Gross per 24 hour  Intake 1258.19 ml  Output 3200 ml  Net -1941.81 ml    Physical Exam    CVP ~9 General: No resp difficulty. HEENT: Normal Neck: Supple. JVP 10-12. Carotids 2+ bilat; no bruits. No thyromegaly or nodule noted. Cor: PMI nondisplaced. IRR, mechanical s2, 2/6 SEM at RUSB Lungs: diminished Abdomen: Soft, non-tender, non-distended, no HSM. No bruits or masses. +BS  Extremities: No cyanosis, clubbing, or rash. R and LLE no edema. BLE unna boots, RUE PICC.  Neuro: Alert & orientedx3, cranial nerves grossly intact. moves all 4  extremities w/o difficulty. Affect pleasant  Telemetry   Afib 70s. Personally reviewed.   Labs    CBC Recent Labs    11/01/17 0404 11/02/17 0537  WBC 2.9* 4.2  HGB 10.4* 8.0*  HCT 33.6* 26.3*  MCV 107.7* 109.1*  PLT 82* 704*   Basic Metabolic Panel Recent Labs    11/01/17 0404 11/01/17 1416  NA 136 136  K 2.9* 3.3*  CL 82* 83*  CO2 40* 38*  GLUCOSE 107* 158*  BUN 48* 46*  CREATININE 1.34* 1.46*  CALCIUM 9.4 9.2   Liver Function Tests No results for input(s): AST, ALT, ALKPHOS, BILITOT, PROT, ALBUMIN in the last 72 hours. No results for input(s): LIPASE, AMYLASE in the last 72 hours. Cardiac Enzymes No results for input(s): CKTOTAL, CKMB, CKMBINDEX, TROPONINI in the last 72 hours.  BNP: BNP (last 3 results) Recent Labs    08/01/17 1804 10/18/17 1510  BNP 784.4* 1,073.3*    ProBNP (last 3 results) Recent Labs    03/30/17 1147 05/25/17 1044 07/10/17 1523  PROBNP 1,858* 2,749* 411.0*     D-Dimer No results for input(s): DDIMER in the last 72 hours. Hemoglobin A1C No results for input(s): HGBA1C in the last 72 hours. Fasting Lipid Panel No results for input(s): CHOL, HDL, LDLCALC, TRIG, CHOLHDL, LDLDIRECT in the last 72 hours. Thyroid Function Tests No results for input(s): TSH, T4TOTAL, T3FREE, THYROIDAB in the  last 72 hours.  Invalid input(s): FREET3  Other results:   Imaging    No results found.   Medications:     Scheduled Medications: . acetaZOLAMIDE  250 mg Oral BID  . carvedilol  3.125 mg Oral BID WC  . docusate sodium  100 mg Oral BID  . ezetimibe-simvastatin  1 tablet Oral QHS  . folic acid  1 mg Oral Daily  . insulin aspart  0-15 Units Subcutaneous TID WC  . potassium chloride  60 mEq Oral BID  . sodium chloride flush  3 mL Intravenous Q12H  . spironolactone  12.5 mg Oral Daily  . vitamin B-12  3,000 mcg Oral Daily  . Warfarin - Pharmacist Dosing Inpatient   Does not apply q1800    Infusions: . sodium chloride    .  furosemide (LASIX) infusion 15 mg/hr (11/02/17 0000)  . heparin 1,250 Units/hr (11/02/17 0529)    PRN Medications: sodium chloride, acetaminophen, fluocinonide cream, ipratropium-albuterol, ondansetron (ZOFRAN) IV, oxymetazoline, senna, sodium chloride flush, sodium chloride flush    Patient Profile   [name] is a 82 y.o. male with h/o mechanical AVR 1994, OSA on CPAP, Chronic afib, chronic combined CHF, CKD III, chronic coumadin therapy, CAD s/p CAGB 1994, and COPD.   Admitted from [facility] office 10/18/17 with Acute on chronic diastolic CHF.   Assessment/Plan   1. Acute on chronic diastolic CHF with prominent RV failure: - Echo this admission showed EF 55-65%, mechanical aortic valve functioning normally, mild mitral stenosis/severe mitral regurgitation, RV moderately dilated with moderately decreased systolic function, PASP 49 mmHg.  Severe MR could be contributing to the significant RV failure.  - Overall weight down 78 pounds. Volume status improved - CVP ~9. Stop lasix drip. Start torsemide 80 mg BID.  - Continue spironolactone 12.5 mg daily.  - Continue coreg to 3.125 mg BID. SBP 84-90s. Denies dizziness. 3. Mechanical aortic valve: Appears to function well on echo this admission.  - Continue warfarin with INR goal 2.5-3.5. - INR 1.84  Discussed dosing with PharmD personally. Remains on coumadin/heparin bridge 4. Atrial fibrillation: Chronic.  - Rate controlled 70s. Continue reduced dose of coreg and spiro with soft BPs.  - INR 1.84 Continue warfarin and heparin drip per pharmacy. Keep INR closer 2.5 with nose bleed. Discussed dosing with PharmD personally.  5. CAD: s/p CABG.  - No s/s ischemia.  - Continue Vytorin.  6. COPD:  - On home oxygen. No change.  7. OSA:  - Continue home CPAP. No change.  8. Acute on CKD: Stage 3.   - Continue lasix drip. Creatinine improving 1.38 > 1.48 > 1.28 > 1.38 > 1.34. BMET pending.  9. Constipation - Resolved. No change.  10.  Deconditioning - He will need HH PT/OT and 24 hour supervision. No change.  11. Epistaxis - Resolved. No change.  12. Anemia  - Hemoglobin 8.0 (10.4 yesterday was outlier, typically around 8.0) 13. Thrombocytopenia: This appears chronic, had before this admission. No change 14. Severe MR - TEE with severe MR with possible rheumatic MV (do not think MS is significant, mildly elevated mean gradient from high flow with severe MR): Structural Heart evaluated. It does not appear that he will be a Mitraclip candidate. Hs been seen by Dr. [name] who recommended aggressive medical therapy versus possible experimental percutaneous MVR as he felt very high risk for conventional surgery. No change.  15. Hypokalemia - Pending.   Length of Stay: [place], NP  11/02/2017, 7:53  AM  Advanced Heart Failure Team Pager [phone] (M-F; 7a - 4p)  Please contact [facility] for night-coverage after hours (4p -7a ) and weekends on [email or web address]  Patient seen with NP, agree with the above note.   He diuresed wellagainyesterday, CVP down 9. SBP generally in 80s-90s, no lightheadedness. Walking in halls without significant dyspnea, says ankles feel weak. Creatinine pending.  On exam, JVP 8 cm with 1+ ankle edema.   CVP down < 10.  Will stop IV Lasix and transition to torsemide 80 mg po bid for home. Pending BMET still.   With soft BP,we have decreasedCoreg to 3.125 mg bid and spironolactone to 12.5 mg daily.  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 Mitral stenosis is minimal. We sent off his TEE for Mitraclip evaluation, but it appears his MVA may be too small for successful clip (<3.0 cm^2). If he cannot get Mitraclip, options will be ongoing medical management versus MV surgery versus transcatheter MVR within clinical trial. Dr. [name] has seen, surgical risk very high and he would be reluctant to operate in this situation. I think at this point it may be best to medically  optimize him and then look for a trial site for transcatheter MVR.   Will plan on home tomorrow if he remains stable on po meds.   Loralie Champagne 11/02/2017 8:42 AM

## 2017-11-02 NOTE — Progress Notes (Signed)
ANTICOAGULATION CONSULT NOTE - Follow-up Consult  Pharmacy Consult for heparin Indication: mechanical aortic valve + Afib  Allergies  Allergen Reactions  . Rifampin Rash    May have been caused by Vancomycin or Rifampin (??)  . Vancomycin Rash    May have been caused by Vancomycin or Rifampin (??)    Patient Measurements: Height: 6\' 2"  (188 cm) Weight: 221 lb 1.6 oz (100.3 kg) IBW/kg (Calculated) : 82.2 Heparin Dosing Weight: n/a   Vital Signs: Temp: 97.5 F (36.4 C) (08/02 0448) Temp Source: Oral (08/02 0448) BP: 93/63 (08/02 0448) Pulse Rate: 77 (08/02 0448)  Labs: Recent Labs    10/30/17 2306  10/31/17 0346 10/31/17 0438 11/01/17 0404 11/01/17 1416 11/01/17 1648 11/01/17 1843 11/02/17 0537  HGB  --    < > 8.3*  --  10.4*  --   --   --  8.0*  HCT  --   --  26.7*  --  33.6*  --   --   --  26.3*  PLT  --   --  76*  --  82*  --   --   --  113*  LABPROT  --   --   --  21.9* 19.7*  --   --   --  21.1*  INR  --   --   --  1.93 1.69  --   --   --  1.84  HEPARINUNFRC  --   --   --   --   --   --  1.88* 0.52 0.55  CREATININE 1.34*  --   --   --  1.34* 1.46*  --   --   --    < > = values in this interval not displayed.    Estimated Creatinine Clearance: 51 mL/min (A) (by C-G formula based on SCr of 1.46 mg/dL (H)).   Medical History: Past Medical History:  Diagnosis Date  . Carotid artery disease ([place]) 1994   s/p left carotid endarerectomy   . Chronic atrial fibrillation (H[facility])    a. on coumadin   . Chronic diastolic CHF (congestive heart failure) ([place])   . Hx of CABG    a. 1994  . Hypertension   . OSA (obstructive sleep apnea)   . Rheumatic fever   . S/P AVR (aortic valve replacement)    a. mechanical valve 1996  . Subclavian bypass stenosis ([place])   . Vitamin B12 deficiency    Assessment: 82 yo male on chronic Coumadin for mechanical aortic valve + afib. PTA Coumadin dose 5 mg daily except 2.5 mg on Mon, Fri.  INR subtherapeutic but increased from 1.69  to 1.84. Given continued decrease in INR, added heparin bridge on 8/1.   Morning heparin level therapeutic at 0.55, on 1250 units/hr. Hgb down to 8, plt up to 113. No s/sx of bleeding. No infusion issues.  Goal of Therapy:  Heparin level: 0.3-0.7  INR 2.5-3.5 Monitor platelets by anticoagulation protocol: Yes   Plan:  -Continue IV heparin 1250 units/hr -Warfarin 5 tonight (normal PTA dose would be 2.5 mg) -Monitor daily HL, INR, CBC, s/sx of bleeding  Doylene Canard, PharmD Clinical Pharmacist  Pager: [phone] Phone: [phone] Please check AMION for all [place] numbers 11/02/2017

## 2017-11-02 NOTE — Progress Notes (Signed)
CARDIAC REHAB PHASE I   PRE:  Rate/Rhythm: 63 Afib with PVCs  BP:  Lying: 83/56      SaO2: 94 2L  MODE:  Ambulation: 40 ft   POST:  Rate/Rhythm: 93 Afib with PVCs  BP:  Sitting: 66/47   -> 73/45    SaO2: 92 3L   Pt ambulated 14ft in hallway assist of 2 with front wheel walker. Pt took 3 standing rest breaks. Pt c/o foot pain, but denied dizziness or SOB. Pt given HF booklet and low Na diet. Reviewed with pt importance of daily weights. Tried to explain "Zone" tool, pt requesting education be done in wife's presence. Will try to reinforce education at later date as time allows.  [id number] Rufina Falco, RN BSN 11/02/2017 2:14 PM

## 2017-11-02 NOTE — Progress Notes (Deleted)
Discharge instruction was given to pt.  Idolina Primer, RN

## 2017-11-02 NOTE — Care Management Important Message (Signed)
Important Message  Patient Details  Name: [name] MRN: [id number] Date of Birth: 05-Jun-1935   Medicare Important Message Given:  Yes    [name] 11/02/2017, 3:03 PM

## 2017-11-03 LAB — BASIC METABOLIC PANEL
Anion gap: 11 (ref 5–15)
BUN: 60 mg/dL — ABNORMAL HIGH (ref 8–23)
CHLORIDE: 88 mmol/L — AB (ref 98–111)
CO2: 38 mmol/L — ABNORMAL HIGH (ref 22–32)
Calcium: 9.2 mg/dL (ref 8.9–10.3)
Creatinine, Ser: 1.68 mg/dL — ABNORMAL HIGH (ref 0.61–1.24)
GFR, EST AFRICAN AMERICAN: 43 mL/min — AB (ref >60)
GFR, EST NON AFRICAN AMERICAN: 37 mL/min — AB (ref >60)
Glucose, Bld: 103 mg/dL — ABNORMAL HIGH (ref 70–99)
POTASSIUM: 3.3 mmol/L — AB (ref 3.5–5.1)
Sodium: 137 mmol/L (ref 135–145)

## 2017-11-03 LAB — CBC
HEMATOCRIT: 21 % — AB (ref 39.0–52.0)
HEMOGLOBIN: 6.3 g/dL — AB (ref 13.0–17.0)
MCH: 32.8 pg (ref 26.0–34.0)
MCHC: 30 g/dL (ref 30.0–36.0)
MCV: 109.4 fL — ABNORMAL HIGH (ref 78.0–100.0)
Platelets: 121 10*3/uL — ABNORMAL LOW (ref 150–400)
RBC: 1.92 MIL/uL — AB (ref 4.22–5.81)
RDW: 16.8 % — ABNORMAL HIGH (ref 11.5–15.5)
WBC: 4.3 10*3/uL (ref 4.0–10.5)

## 2017-11-03 LAB — GLUCOSE, CAPILLARY
GLUCOSE-CAPILLARY: 112 mg/dL — AB (ref 70–99)
GLUCOSE-CAPILLARY: 121 mg/dL — AB (ref 70–99)
Glucose-Capillary: 122 mg/dL — ABNORMAL HIGH (ref 70–99)
Glucose-Capillary: 106 mg/dL — ABNORMAL HIGH (ref 70–99)

## 2017-11-03 LAB — PROTIME-INR
INR: 1.93
PROTHROMBIN TIME: 21.9 s — AB (ref 11.4–15.2)

## 2017-11-03 LAB — URINALYSIS, ROUTINE W REFLEX MICROSCOPIC
Bilirubin Urine: NEGATIVE
Glucose, UA: NEGATIVE mg/dL
HGB URINE DIPSTICK: NEGATIVE
Ketones, ur: NEGATIVE mg/dL
Leukocytes, UA: NEGATIVE
Nitrite: NEGATIVE
PH: 9 — AB (ref 5.0–8.0)
Protein, ur: NEGATIVE mg/dL
Specific Gravity, Urine: 1.009 (ref 1.005–1.030)

## 2017-11-03 LAB — URIC ACID: URIC ACID, SERUM: 14.3 mg/dL — AB (ref 3.7–8.6)

## 2017-11-03 LAB — PREPARE RBC (CROSSMATCH)

## 2017-11-03 LAB — OCCULT BLOOD X 1 CARD TO LAB, STOOL: FECAL OCCULT BLD: NEGATIVE

## 2017-11-03 LAB — HEPARIN LEVEL (UNFRACTIONATED): HEPARIN UNFRACTIONATED: 0.64 [IU]/mL (ref 0.30–0.70)

## 2017-11-03 LAB — HEMOGLOBIN AND HEMATOCRIT, BLOOD
HEMATOCRIT: 24.3 % — AB (ref 39.0–52.0)
HEMOGLOBIN: 7.5 g/dL — AB (ref 13.0–17.0)

## 2017-11-03 LAB — ABO/RH: ABO/RH(D): O NEG

## 2017-11-03 MED ORDER — BISACODYL 10 MG RE SUPP
10.0000 mg | Freq: Once | RECTAL | Status: AC
  Administered 2017-11-03: 10 mg via RECTAL
  Filled 2017-11-03: qty 1

## 2017-11-03 MED ORDER — QUETIAPINE FUMARATE 25 MG PO TABS
25.0000 mg | ORAL_TABLET | Freq: Every day | ORAL | Status: DC
  Administered 2017-11-03 – 2017-11-04 (×3): 25 mg via ORAL
  Filled 2017-11-03 (×3): qty 1

## 2017-11-03 MED ORDER — FLEET ENEMA 7-19 GM/118ML RE ENEM: 1.0000 | ENEMA | Freq: Every day | RECTAL | Status: DC | PRN

## 2017-11-03 MED ORDER — PANTOPRAZOLE SODIUM 40 MG PO TBEC
40.0000 mg | DELAYED_RELEASE_TABLET | Freq: Every day | ORAL | Status: DC
Start: 2017-11-03 — End: 2017-11-05
  Administered 2017-11-03 – 2017-11-05 (×3): 40 mg via ORAL
  Filled 2017-11-03 (×3): qty 1

## 2017-11-03 MED ORDER — COLCHICINE 0.6 MG PO TABS
0.6000 mg | ORAL_TABLET | Freq: Every day | ORAL | Status: DC
  Administered 2017-11-03 – 2017-11-05 (×3): 0.6 mg via ORAL
  Filled 2017-11-03 (×3): qty 1

## 2017-11-03 MED ORDER — TORSEMIDE 20 MG PO TABS
80.0000 mg | ORAL_TABLET | Freq: Two times a day (BID) | ORAL | Status: DC
  Administered 2017-11-04 – 2017-11-05 (×3): 80 mg via ORAL
  Filled 2017-11-03 (×3): qty 4

## 2017-11-03 MED ORDER — POLYETHYLENE GLYCOL 3350 17 G PO PACK
17.0000 g | PACK | Freq: Every day | ORAL | Status: DC
  Administered 2017-11-03 – 2017-11-04 (×2): 17 g via ORAL
  Filled 2017-11-03 (×2): qty 1

## 2017-11-03 MED ORDER — SODIUM CHLORIDE 0.9% IV SOLUTION
Freq: Once | INTRAVENOUS | Status: AC
  Administered 2017-11-03: 12:00:00 via INTRAVENOUS

## 2017-11-03 MED ORDER — WARFARIN SODIUM 5 MG PO TABS
5.0000 mg | ORAL_TABLET | Freq: Once | ORAL | Status: AC
  Administered 2017-11-03: 5 mg via ORAL
  Filled 2017-11-03: qty 1

## 2017-11-03 MED ORDER — TORSEMIDE 20 MG PO TABS: 80.0000 mg | ORAL_TABLET | Freq: Two times a day (BID) | ORAL | Status: DC

## 2017-11-03 MED ORDER — POTASSIUM CHLORIDE CRYS ER 20 MEQ PO TBCR
40.0000 meq | EXTENDED_RELEASE_TABLET | Freq: Once | ORAL | Status: AC
  Administered 2017-11-03: 40 meq via ORAL
  Filled 2017-11-03: qty 2

## 2017-11-03 NOTE — Progress Notes (Addendum)
Patient ID: [name], male   DOB: Jul 31, 1935, 82 y.o.   MRN: [id number]     Advanced Heart Failure Rounding Note  PCP-Cardiologist: [name], MD   Subjective:    IV Lasix and metolazone stopped 8/2, transitioned to po torsemide. CVP 8-9 today.   Creatinine 1.2->1.5 ->1.38 -> 1.28 -> 1.34 -> 1.34 -> 1.66. BP lower, mainly in 80s now.   INR 1.93, hemoglobin 7.5 repeat (6.3 initially). On heparin drip + coumadin.    He seems more lethargic and fatigued today though he denies lightheadedness or dyspnea.    TEE (7/23): showed EF 55-60%, mildly dilated/dysfunctional RV, severe MR likely mixed primary and secondary etiology with minimal mitral stenosis.  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   Objective:   Weight Range: 221 lb 9 oz (100.5 kg) Body mass index is 28.45 kg/m.   Vital Signs:   Temp:  [97.7 F (36.5 C)-100.2 F (37.9 C)] 98.6 F (37 C) (08/03 0805) Pulse Rate:  [72-90] 73 (08/03 0805) Resp:  [14-22] 20 (08/03 0805) BP: (68-144)/(45-133) 79/60 (08/03 0805) SpO2:  [94 %-99 %] 98 % (08/03 0805) Weight:  [221 lb 9 oz (100.5 kg)] 221 lb 9 oz (100.5 kg) (08/03 0400) Last BM Date: 11/02/17  Weight change: Filed Weights   11/01/17 0500 11/02/17 0448 11/03/17 0400  Weight: 228 lb 8 oz (103.6 kg) 221 lb 1.6 oz (100.3 kg) 221 lb 9 oz (100.5 kg)   Intake/Output:   Intake/Output Summary (Last 24 hours) at 11/03/2017 0953 Last data filed at 11/03/2017 0800 Gross per 24 hour  Intake 1818.1 ml  Output 1750 ml  Net 68.1 ml    Physical Exam    CVP ~8-9 General: NAD Neck: JVP 8 cm, no thyromegaly or thyroid nodule.  Lungs: Clear to auscultation bilaterally with normal respiratory effort. CV: Nondisplaced PMI.  Heart irregular S1/S2 with mechanical S2, no S3/S4, 2/6 HSM apex.  1+ ankle edema.   Abdomen: Soft, nontender, no hepatosplenomegaly, no distention.  Skin: Intact without lesions or rashes.  Neurologic: Alert and oriented x 3.  Psych: Normal  affect. Extremities: No clubbing or cyanosis.  HEENT: Normal.    Telemetry   Afib 70s. Personally reviewed.   Labs    CBC Recent Labs    11/02/17 0537 11/03/17 0500 11/03/17 0643  WBC 4.2 4.3  --   HGB 8.0* 6.3* 7.5*  HCT 26.3* 21.0* 24.3*  MCV 109.1* 109.4*  --   PLT 113* 121*  --    Basic Metabolic Panel Recent Labs    11/02/17 1333 11/03/17 0500  NA 137 137  K 3.0* 3.3*  CL 87* 88*  CO2 37* 38*  GLUCOSE 110* 103*  BUN 50* 60*  CREATININE 1.41* 1.68*  CALCIUM 9.2 9.2   Liver Function Tests No results for input(s): AST, ALT, ALKPHOS, BILITOT, PROT, ALBUMIN in the last 72 hours. No results for input(s): LIPASE, AMYLASE in the last 72 hours. Cardiac Enzymes No results for input(s): CKTOTAL, CKMB, CKMBINDEX, TROPONINI in the last 72 hours.  BNP: BNP (last 3 results) Recent Labs    08/01/17 1804 10/18/17 1510  BNP 784.4* 1,073.3*    ProBNP (last 3 results) Recent Labs    03/30/17 1147 05/25/17 1044 07/10/17 1523  PROBNP 1,858* 2,749* 411.0*     D-Dimer No results for input(s): DDIMER in the last 72 hours. Hemoglobin A1C No results for input(s): HGBA1C in the last 72 hours. Fasting Lipid Panel No results for input(s): CHOL, HDL, LDLCALC, TRIG,  CHOLHDL, LDLDIRECT in the last 72 hours. Thyroid Function Tests No results for input(s): TSH, T4TOTAL, T3FREE, THYROIDAB in the last 72 hours.  Invalid input(s): FREET3  Other results:   Imaging    No results found.   Medications:     Scheduled Medications: . sodium chloride   Intravenous Once  . docusate sodium  100 mg Oral BID  . ezetimibe-simvastatin  1 tablet Oral QHS  . folic acid  1 mg Oral Daily  . insulin aspart  0-15 Units Subcutaneous TID WC  . potassium chloride  40 mEq Oral Once  . QUEtiapine  25 mg Oral QHS  . sodium chloride flush  3 mL Intravenous Q12H  . torsemide  80 mg Oral BID  . vitamin B-12  3,000 mcg Oral Daily  . warfarin  5 mg Oral ONCE-1800  . Warfarin -  Pharmacist Dosing Inpatient   Does not apply q1800    Infusions: . sodium chloride      PRN Medications: sodium chloride, acetaminophen, fluocinonide cream, ipratropium-albuterol, ondansetron (ZOFRAN) IV, oxymetazoline, senna, sodium chloride flush, sodium chloride flush    Patient Profile   [name] is a 82 y.o. male with h/o mechanical AVR 1994, OSA on CPAP, Chronic afib, chronic combined CHF, CKD III, chronic coumadin therapy, CAD s/p CAGB 1994, and COPD.   Admitted from [facility] office 10/18/17 with Acute on chronic diastolic CHF.   Assessment/Plan   1. Acute on chronic diastolic CHF with prominent RV failure:  Echo this admission showed EF 55-65%, mechanical aortic valve functioning normally, mild mitral stenosis/severe mitral regurgitation, RV moderately dilated with moderately decreased systolic function, PASP 49 mmHg.  Severe MR could be contributing to the significant RV failure.  He has been extensively diuresed with weight down > 70 lbs.  CVP 8-9 today and back on po torsemide.  However, SBP soft in 80s and he is fatigued.  Hgb has been low but was even lower this morning at 6.3 (recheck 7.5). Creatinine up to 1.66.  - Hold torsemide today, restart tomorrow (ordered for 80 mg bid tomorrow but will reassess in am.  Will give KCl 40 mEq x 1 today with low K.  - Stop spironolactone and Coreg with low BP.  3. Mechanical aortic valve: Appears to function well on echo this admission.  - Continue warfarin with INR goal 2.5-3.5. - INR 1.93 today, will stop heparin gtt with hgb trending down further.  Continue warfarin for now.  4. Atrial fibrillation: Chronic. Rate controlled 70s.  - Stop heparin gtt today with hgb lower, continue coumadin for now.  5. CAD: s/p CABG.  No chest pain.  - Continue Vytorin.  6. COPD: On home oxygen. No change.  7. OSA: Continue home CPAP. No change.  8. Acute on CKD: Stage 3.  Creatinine higher at 1.66 this morning.  He is now on po torsemide.  -  Hold torsemide today, possibly restart tomorrow.  - Stop all BP active meds to promote higher BP.  9. Anemia: Hgb had been stable in hospital around 8, but has trended down (6.3 this morning but recheck 7.5).  BP low as well. No overt bleeding (denies melena/BRBPR) but has not had stool guaiac.  Fe studies were unremarkable earlier.  - Continue Protonix.  - Will give 1 unit PRBCs today.  - Will ask GI to see, ?GI blood loss.  - Guaiac stool.  10. Deconditioning: He will need HH PT/OT and 24 hour supervision after discharge.  11. Thrombocytopenia: This appears  chronic, had before this admission. No change 12. 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 Structural Heart team evaluated. It does not appear that he will be a Mitraclip candidate. Hs been seen by Dr. [name] who recommended aggressive medical therapy versus possible experimental percutaneous MVR as he felt very high risk for conventional surgery. No change.  13. Gout: Suspect this is cause of right ankle pain.  Uric acid is high.  Some improvement in pain.  Will give colchicine once daily.   Loralie Champagne 11/03/2017 9:53 AM

## 2017-11-03 NOTE — Progress Notes (Signed)
ANTICOAGULATION CONSULT NOTE - Follow-up Consult  Pharmacy Consult for heparin Indication: mechanical aortic valve + Afib  Allergies  Allergen Reactions  . Rifampin Rash    May have been caused by Vancomycin or Rifampin (??)  . Vancomycin Rash    May have been caused by Vancomycin or Rifampin (??)    Patient Measurements: Height: 6\' 2"  (188 cm) Weight: 221 lb 9 oz (100.5 kg) IBW/kg (Calculated) : 82.2 Heparin Dosing Weight: n/a   Vital Signs: Temp: 98.6 F (37 C) (08/03 0805) Temp Source: Oral (08/03 0805) BP: 79/60 (08/03 0805) Pulse Rate: 73 (08/03 0805)  Labs: Recent Labs    11/01/17 0404  11/01/17 1843 11/02/17 0537 11/02/17 0814 11/02/17 1333 11/03/17 0500 11/03/17 0643  HGB 10.4*  --   --  8.0*  --   --  6.3* 7.5*  HCT 33.6*  --   --  26.3*  --   --  21.0* 24.3*  PLT 82*  --   --  113*  --   --  121*  --   LABPROT 19.7*  --   --  21.1*  --   --  21.9*  --   INR 1.69  --   --  1.84  --   --  1.93  --   HEPARINUNFRC  --    < > 0.52 0.55  --   --  0.64  --   CREATININE 1.34*   < >  --   --  1.39* 1.41* 1.68*  --    < > = values in this interval not displayed.    Estimated Creatinine Clearance: 44.4 mL/min (A) (by C-G formula based on SCr of 1.68 mg/dL (H)).   Medical History: Past Medical History:  Diagnosis Date  . Carotid artery disease ([place]) 1994   s/p left carotid endarerectomy   . Chronic atrial fibrillation (HCC)    a. on coumadin   . Chronic diastolic CHF (congestive heart failure) ([place])   . Hx of CABG    a. 1994  . Hypertension   . OSA (obstructive sleep apnea)   . Rheumatic fever   . S/P AVR (aortic valve replacement)    a. mechanical valve 1996  . Subclavian bypass stenosis ([place])   . Vitamin B12 deficiency    Assessment: 82 yo male on chronic Coumadin for mechanical aortic valve + afib. PTA Coumadin dose 5 mg daily except 2.5 mg on Mon, Fri.  INR subtherapeutic but increased from 1.84 to 1.93. On heparin bridge started 8/1.    Morning heparin level therapeutic at 0.64, on 1250 units/hr. Hgb down to 6.3, repeat 7.5, plt up to 121.  No infusion issues. Receiving 1 unit of PRBCs.  Goal of Therapy:  Heparin level: 0.3-0.7  INR 2.5-3.5 Monitor platelets by anticoagulation protocol: Yes   Plan:  -Holding heparin per cardiology, for low Hgb and pending GI studies -Warfarin 5 tonight (normal PTA dose would be 2.5 mg) -Monitor daily HL, INR, CBC, s/sx of bleeding -Follow up on heparin bridge plan

## 2017-11-03 NOTE — Progress Notes (Signed)
Patient sleeping at this time. Will keep monitoring patient.

## 2017-11-03 NOTE — Progress Notes (Signed)
CRITICAL VALUE ALERT  Critical Value:  Hgb 6.3  Date & Time Notied:  8/3 at  [phone]  Provider Notified: MD on call  Orders Received/Actions taken: pending.

## 2017-11-03 NOTE — Progress Notes (Signed)
Patient has been trying to get out of bed several times.  I have paged MD to give patient something to relax.

## 2017-11-03 NOTE — Consult Note (Signed)
Referring Provider:  Dr. Loralie Champagne Primary Care Physician:  [name], [name], MD Primary Gastroenterologist:  None (previous patient of Drs. Sammuel Cooper and Wynetta Emery, who are now retired)  Reason for Consultation:  Drop in hemoglobin  HPI: [name] is a 82 y.o. male with severe right heart failure, mitral regurgitation, status post mechanical St. Jude's aortic valve replacement on chronic anticoagulation with Coumadin, coronary disease status post CABG, chronic atrial fibrillation, and history of peripheral vascular disease status post carotid endarterectomy, who has been in the hospital for over 2 weeks being diuresed.    We are asked to see him because of a progressive downward drift in hemoglobin level.  The patient had been hanging at a hemoglobin of approximately 8 for the past week or so, but then this morning it dropped precipitously to 6.3 but, although on repeat it was back up to 7.5.  Associated with this, there has been no visible bleeding.  There is no history of ulcer disease, previous GI bleeding, or exposure to ulcerogenic medications.  The patient does not have any significant upper tract symptoms such as nausea, epigastric pain, loss of appetite, reflux symptoms.  The patient's colon was last examined by virtual colonoscopy in December 2011, which was negative for any definite lesions.  A previous barium enema some years earlier head showing left-sided diverticulosis.    The patient has lots of superficial cutaneous bruising, but no evident hematoma.  Both his BUN and his creatinine have risen moderately from baseline over the past couple of days since the hemoglobin dropped, so there is no clear isolated rise in BUN to suggest an upper tract source of bleeding.   The patient is maintained chronically on Coumadin, but there has been no supratherapeutic elevation of his INR in recent weeks.   Past Medical History:  Diagnosis Date  . Carotid artery disease ([place]) 1994    s/p left carotid endarerectomy   . Chronic atrial fibrillation (HCC)    a. on coumadin   . Chronic diastolic CHF (congestive heart failure) ([place])   . Hx of CABG    a. 1994  . Hypertension   . OSA (obstructive sleep apnea)   . Rheumatic fever   . S/P AVR (aortic valve replacement)    a. mechanical valve 1996  . Subclavian bypass stenosis ([place])   . Vitamin B12 deficiency     Past Surgical History:  Procedure Laterality Date  . [place]   replaced due to aortic stenosis, St. Jude mechanical prostesis  . CAROTID ENDARTERECTOMY Left 1997   subclavian bypass Done in [place]  . CORONARY ARTERY BYPASS GRAFT  1994   w SVG to RCA and SVG to circumflex  . heart bypass     Done in [place]  . TEE WITHOUT CARDIOVERSION N/A 10/23/2017   Procedure: TRANSESOPHAGEAL ECHOCARDIOGRAM (TEE);  Surgeon: Larey Dresser, MD;  Location[facility] ENDOSCOPY;  Service: Cardiovascular;  Laterality: N/A;    Prior to Admission medications   Medication Sig Start Date End Date Taking? Authorizing Provider  acetaminophen (TYLENOL) 500 MG tablet Take 500-1,000 mg by mouth daily as needed (for pain).    Yes [provider]  carvedilol (COREG) 25 MG tablet Take 1 tablet (25 mg total) by mouth 2 (two) times daily with a meal. 04/12/17  Yes Belva Crome, MD  Cholecalciferol (VITAMIN D3) 2000 units capsule Take 1 capsule (2,000 Units total) by mouth daily. 04/05/17  Yes [name], [name], MD  Cyanocobalamin (VITAMIN B-12)  3000 MCG SUBL Take 3,000 mcg by mouth daily.    Yes [provider]  dextromethorphan-guaiFENesin (MUCINEX DM) 30-600 MG 12hr tablet Take 1 tablet by mouth 2 (two) times daily. 08/03/17  Yes Doreatha Lew, MD  Dietary Management Product [facility]) TABS Take 1 tablet by mouth daily. 04/05/17  Yes [name], [name], MD  ezetimibe-simvastatin (VYTORIN) 10-20 MG tablet Take 1 tablet by mouth at bedtime. 04/05/17  Yes [name], [name], MD  fluocinonide cream  (LIDEX) 3.08 % Apply 1 application topically 2 (two) times daily as needed (For legs).   Yes [provider]  folic acid (FOLVITE) 1 MG tablet Take 1 tablet (1 mg total) by mouth daily. 12/02/14  Yes Brunetta Genera, MD  hydrocortisone cream 1 % Apply 1 application topically as needed for itching (for legs).   Yes [provider]  metFORMIN (GLUCOPHAGE-XR) 750 MG 24 hr tablet TAKE 2 TABLETS (1,500 MG TOTAL) BY MOUTH DAILY. 07/03/17  Yes Elayne Snare, MD  NON FORMULARY BiPAP: At bedtime   Yes [provider]  PROAIR HFA 108 (90 Base) MCG/ACT inhaler INHALE 2 PUFFS INTO THE LUNGS EVERY 6 HOURS AS NEEDED (FOR CHEST CONGESTION). 10/16/17  Yes [name], [name], MD  torsemide (DEMADEX) 100 MG tablet Take 1 tablet (100 mg total) by mouth daily. 09/13/17  Yes [name], [name], MD  warfarin (COUMADIN) 5 MG tablet Take 5 mg by mouth daily EXCEPT for two (2) days a week take 2.5 mg by mouth daily on Monday and Friday. Take as directed per [facility].   Yes [provider]  bumetanide (BUMEX) 2 MG tablet Take 4 mg with Torsemide. Take another 4 mg in 4 hrs 10/17/17   [name], [name], MD  glucose blood (ONE TOUCH ULTRA TEST) test strip USE AS DIRECTED TO TEST BLOOD GLUCOSE  EVERY OTHER DAY DX: 250.00 08/23/16   Elayne Snare, MD  glucose blood (ONETOUCH VERIO) test strip Use as instructed to check blood sugar once daily. Dx Code E11.8 05/07/17   Elayne Snare, MD  ipratropium-albuterol (DUONEB) 0.5-2.5 (3) MG/3ML SOLN Take 3 mLs by nebulization 4 (four) times daily. 10/17/17   [name], [name], MD  NON FORMULARY ONE TOUCH ULTRA TEST STRIPS  AND LANCETS    [provider]  [facility] DELICA LANCETS FINE MISC Use to obtain a blood specimen every day Dx code E11.8 05/07/17   Elayne Snare, MD    Current Facility-Administered Medications  Medication Dose Route Frequency Provider Last Rate Last Dose  . 0.9 %  sodium chloride infusion  250 mL Intravenous PRN Georgiana Shore, NP      . acetaminophen (TYLENOL) tablet 650 mg  650 mg Oral Q4H PRN Georgiana Shore, NP      . colchicine tablet 0.6 mg  0.6 mg Oral Daily Larey Dresser, MD   0.6 mg at 11/03/17 1135  . docusate sodium (COLACE) capsule 100 mg  100 mg Oral BID Georgiana Shore, NP   100 mg at 11/03/17 6578  . ezetimibe-simvastatin (VYTORIN) 10-20 MG per tablet 1 tablet  1 tablet Oral QHS Georgiana Shore, NP   1 tablet at 11/02/17 2249  . fluocinonide cream (LIDEX) 4.69 % 1 application  1 application Topical BID PRN Georgiana Shore, NP      . folic acid (FOLVITE) tablet 1 mg  1 mg Oral Daily Georgiana Shore, NP   1 mg at 11/03/17 6295  . insulin aspart (novoLOG) injection  0-15 Units  0-15 Units Subcutaneous TID WC Georgiana Shore, NP   2 Units at 11/03/17 0830  . ipratropium-albuterol (DUONEB) 0.5-2.5 (3) MG/3ML nebulizer solution 3 mL  3 mL Nebulization Q4H PRN Georgiana Shore, NP      . ondansetron [facility]) injection 4 mg  4 mg Intravenous Q6H PRN Georgiana Shore, NP      . oxymetazoline (AFRIN) 0.05 % nasal spray 2 spray  2 spray Each Nare BID PRN Georgiana Shore, NP   2 spray at 10/24/17 1048  . pantoprazole (PROTONIX) EC tablet 40 mg  40 mg Oral Daily Larey Dresser, MD   40 mg at 11/03/17 1135  . QUEtiapine (SEROQUEL) tablet 25 mg  25 mg Oral QHS Belva Crome, MD   25 mg at 11/03/17 0111  . senna (SENOKOT) tablet 8.6 mg  1 tablet Oral Daily PRN Georgiana Shore, NP   8.6 mg at 10/26/17 2058  . sodium chloride flush (NS) 0.9 % injection 10-40 mL  10-40 mL Intracatheter PRN Georgiana Shore, NP      . sodium chloride flush (NS) 0.9 % injection 3 mL  3 mL Intravenous Q12H Georgiana Shore, NP   3 mL at 10/29/17 0836  . sodium chloride flush (NS) 0.9 % injection 3 mL  3 mL Intravenous PRN Georgiana Shore, NP      . Derrill Memo ON 11/04/2017] torsemide (DEMADEX) tablet 80 mg  80 mg Oral BID Larey Dresser, MD      . vitamin B-12 (CYANOCOBALAMIN) tablet 3,000 mcg  3,000 mcg Oral Daily Georgiana Shore, NP    3,000 mcg at 11/03/17 6644  . warfarin (COUMADIN) tablet 5 mg  5 mg Oral ONCE-1800 Corinda Gubler, RPH      . Warfarin - Pharmacist Dosing Inpatient   Does not apply q1800 Georgiana Shore, NP        Allergies as of 10/18/2017 - Review Complete 10/18/2017  Allergen Reaction Noted  . Rifampin Rash 02/09/2009  . Vancomycin Rash 02/09/2009    Family History  Problem Relation Age of Onset  . Cancer Father        lung   . Hypertension Mother     Social History   Socioeconomic History  . Marital status: Married    Spouse name: Not on file  . Number of children: 3  . Years of education: Not on file  . Highest education level: Not on file  Occupational History  . Not on file  Social Needs  . Financial resource strain: Not on file  . Food insecurity:    Worry: Not on file    Inability: Not on file  . Transportation needs:    Medical: Not on file    Non-medical: Not on file  Tobacco Use  . Smoking status: Former Smoker    Packs/day: 1.00    Years: 30.00    Pack years: 30.00    Types: Cigarettes    Last attempt to quit: 04/03/1992    Years since quitting: 25.6  . Smokeless tobacco: Never Used  Substance and Sexual Activity  . Alcohol use: Yes    Comment: 4-6 ounces of wine daily  . Drug use: No    Comment: Half a cup a day.  Marland Kitchen Sexual activity: Not on file  Lifestyle  . Physical activity:    Days per week: Not on file    Minutes per session: Not on file  . Stress: Not  on file  Relationships  . Social connections:    Talks on phone: Not on file    Gets together: Not on file    Attends religious service: Not on file    Active member of club or organization: Not on file    Attends meetings of clubs or organizations: Not on file    Relationship status: Not on file  . Intimate partner violence:    Fear of current or ex partner: Not on file    Emotionally abused: Not on file    Physically abused: Not on file    Forced sexual activity: Not on file  Other Topics  Concern  . Not on file  Social History Narrative   Patient is married and lives with his wife.   Youngest daughter lives in [place] also.   Patient has 2 other children.    Review of Systems: See history of present illness  Physical Exam: Vital signs in last 24 hours: Temp:  [97.7 F (36.5 C)-100.2 F (37.9 C)] 98.3 F (36.8 C) (08/03 1238) Pulse Rate:  [55-90] 79 (08/03 1238) Resp:  [16-22] 16 (08/03 1238) BP: (68-144)/(45-133) 84/65 (08/03 1238) SpO2:  [94 %-100 %] 100 % (08/03 1238) Weight:  [100.5 kg (221 lb 9 oz)] 100.5 kg (221 lb 9 oz) (08/03 0400) Last BM Date: 11/02/17  The patient is lying in bed in no evident distress.  He actually looks somewhat better than might be anticipated from all of his medical problems.  He is without icterus or overt pallor.  The chest is clear anteriorly although he does not take optimal deep breaths for auscultation.  Auscultation of the heart shows prosthetic valve sounds which are crisp in character, and irregular rhythm, no murmurs appreciated despite history of severe mitral regurgitation.  The skin has multiple small ecchymoses but no obvious hematoma.  There are chronic lower extremity stasis changes in the skin near the ankles and distal tibia, but at present there is no significant peripheral edema.  There are no evident focal neurologic deficits, and the patient seems to have a normal mood and normal cognition.  Rectal exam shows a fecal impaction consisting of clay consistency light brown stool (no visible blood, no melenic character) which is Hemoccult negative on laboratory guaiac testing.  Intake/Output from previous day: 08/02 0701 - 08/03 0700 In: 2178.1 [P.O.:1640; I.V.:538.1] Out: 2075 [Urine:2075] Intake/Output this shift: Total I/O In: 363.7 [P.O.:300; I.V.:63.7] Out: 1375 [NIDPO:2423]  Lab Results: Recent Labs    11/01/17 0404 11/02/17 0537 11/03/17 0500 11/03/17 0643  WBC 2.9* 4.2 4.3  --   HGB 10.4* 8.0* 6.3*  7.5*  HCT 33.6* 26.3* 21.0* 24.3*  PLT 82* 113* 121*  --    BMET Recent Labs    11/02/17 0814 11/02/17 1333 11/03/17 0500  NA 135 137 137  K 2.9* 3.0* 3.3*  CL 85* 87* 88*  CO2 39* 37* 38*  GLUCOSE 166* 110* 103*  BUN 48* 50* 60*  CREATININE 1.39* 1.41* 1.68*  CALCIUM 9.1 9.2 9.2   LFT No results for input(s): PROT, ALBUMIN, AST, ALT, ALKPHOS, BILITOT, BILIDIR, IBILI in the last 72 hours. PT/INR Recent Labs    11/02/17 0537 11/03/17 0500  LABPROT 21.1* 21.9*  INR 1.84 1.93    Studies/Results: No results found.  Impression: 1.  Recent mild drop in hemoglobin, without evidence of active GI bleeding.  Stool heme negative, as noted above.  The drop in hemoglobin is probably multifactorial including laboratory variation, decreased red cell  production due to chronic kidney disease and chronic illness, blood loss due to phlebotomy, etc.  2.  Fecal impaction  Plan: 1.  No GI work-up necessary, which is a good thing in this patient with multiple medical problems who would not be an optimal candidate for endoscopy or colonoscopy.  2.  I have ordered a Dulcolax suppository, to be followed by a Fleet enema if results are not adequate.  In addition, I have recommended that the patient take a senna laxative tablet this evening (already ordered for prn use).  Lastly, I will place the patient on Miralax once daily (would continue while  in the hospital unless diarrhea develops, and it could be used following discharge if the patient has a problem with chronic constipation--I instructed him in its use).  Would have nursing monitor for adequacy of bowel movements each day, and check for fecal impaction in a couple of days.  3.  I will sign off; please call us if we can be of further assistance in this patient's care, or if you have any questions.    LOS: 16 days   Shala Baumbach V  11/03/2017, 2:21 PM   Pager [phone] If no answer or after 5 PM call [phone]

## 2017-11-03 NOTE — Progress Notes (Signed)
ANTICOAGULATION CONSULT NOTE - Follow-up Consult  Pharmacy Consult for heparin Indication: mechanical aortic valve + Afib  Allergies  Allergen Reactions  . Rifampin Rash    May have been caused by Vancomycin or Rifampin (??)  . Vancomycin Rash    May have been caused by Vancomycin or Rifampin (??)    Patient Measurements: Height: 6\' 2"  (188 cm) Weight: 221 lb 9 oz (100.5 kg) IBW/kg (Calculated) : 82.2 Heparin Dosing Weight: n/a   Vital Signs: Temp: 97.7 F (36.5 C) (08/03 0400) Temp Source: Oral (08/03 0400) BP: 85/56 (08/03 0400) Pulse Rate: 77 (08/03 0400)  Labs: Recent Labs    11/01/17 0404  11/01/17 1843 11/02/17 0537 11/02/17 0814 11/02/17 1333 11/03/17 0500  HGB 10.4*  --   --  8.0*  --   --  6.3*  HCT 33.6*  --   --  26.3*  --   --  21.0*  PLT 82*  --   --  113*  --   --  121*  LABPROT 19.7*  --   --  21.1*  --   --  21.9*  INR 1.69  --   --  1.84  --   --  1.93  HEPARINUNFRC  --    < > 0.52 0.55  --   --  0.64  CREATININE 1.34*   < >  --   --  1.39* 1.41* 1.68*   < > = values in this interval not displayed.    Estimated Creatinine Clearance: 44.4 mL/min (A) (by C-G formula based on SCr of 1.68 mg/dL (H)).   Medical History: Past Medical History:  Diagnosis Date  . Carotid artery disease ([place]) 1994   s/p left carotid endarerectomy   . Chronic atrial fibrillation (HCC)    a. on coumadin   . Chronic diastolic CHF (congestive heart failure) ([place])   . Hx of CABG    a. 1994  . Hypertension   . OSA (obstructive sleep apnea)   . Rheumatic fever   . S/P AVR (aortic valve replacement)    a. mechanical valve 1996  . Subclavian bypass stenosis ([place])   . Vitamin B12 deficiency    Assessment: 82 yo male on chronic Coumadin for mechanical aortic valve + afib. PTA Coumadin dose 5 mg daily except 2.5 mg on Mon, Fri.  INR subtherapeutic but increased from 1.84 to 1.93. On heparin bridge started 8/1.   Morning heparin level therapeutic at 0.64, on 1250  units/hr. Hgb down to 6.3, plt up to 121.  No infusion issues.  Goal of Therapy:  Heparin level: 0.3-0.7  INR 2.5-3.5 Monitor platelets by anticoagulation protocol: Yes   Plan:  -Continue IV heparin 1250 units/hr -Warfarin 5 tonight (normal PTA dose would be 2.5 mg) -Monitor daily HL, INR, CBC, s/sx of bleeding  Alanda Slim, PharmD Clinical Pharmacist   Please check AMION for all [facility] Pharmacy numbers 11/03/2017

## 2017-11-04 LAB — BASIC METABOLIC PANEL
Anion gap: 12 (ref 5–15)
Anion gap: 13 (ref 5–15)
BUN: 56 mg/dL — AB (ref 8–23)
BUN: 52 mg/dL — AB (ref 8–23)
CALCIUM: 8.7 mg/dL — AB (ref 8.9–10.3)
CHLORIDE: 90 mmol/L — AB (ref 98–111)
CHLORIDE: 88 mmol/L — AB (ref 98–111)
CO2: 35 mmol/L — AB (ref 22–32)
CO2: 31 mmol/L (ref 22–32)
CREATININE: 1.45 mg/dL — AB (ref 0.61–1.24)
Calcium: 9.3 mg/dL (ref 8.9–10.3)
Creatinine, Ser: 1.27 mg/dL — ABNORMAL HIGH (ref 0.61–1.24)
GFR calc Af Amer: 51 mL/min — ABNORMAL LOW (ref >60)
GFR calc non Af Amer: 44 mL/min — ABNORMAL LOW (ref >60)
GFR, EST AFRICAN AMERICAN: 60 mL/min — AB (ref >60)
GFR, EST NON AFRICAN AMERICAN: 52 mL/min — AB (ref >60)
GLUCOSE: 108 mg/dL — AB (ref 70–99)
GLUCOSE: 116 mg/dL — AB (ref 70–99)
POTASSIUM: 3.1 mmol/L — AB (ref 3.5–5.1)
POTASSIUM: 3.1 mmol/L — AB (ref 3.5–5.1)
SODIUM: 137 mmol/L (ref 135–145)
Sodium: 132 mmol/L — ABNORMAL LOW (ref 135–145)

## 2017-11-04 LAB — TYPE AND SCREEN
ABO/RH(D): O NEG
Antibody Screen: NEGATIVE
UNIT DIVISION: 0

## 2017-11-04 LAB — BPAM RBC
BLOOD PRODUCT EXPIRATION DATE: 201908102359
ISSUE DATE / TIME: 201908031218
Unit Type and Rh: 9500

## 2017-11-04 LAB — GLUCOSE, CAPILLARY
Glucose-Capillary: 105 mg/dL — ABNORMAL HIGH (ref 70–99)
Glucose-Capillary: 134 mg/dL — ABNORMAL HIGH (ref 70–99)
Glucose-Capillary: 124 mg/dL — ABNORMAL HIGH (ref 70–99)
Glucose-Capillary: 100 mg/dL — ABNORMAL HIGH (ref 70–99)

## 2017-11-04 LAB — HEPARIN LEVEL (UNFRACTIONATED)
Heparin Unfractionated: <0.1 IU/mL — ABNORMAL LOW (ref 0.30–0.70)
Heparin Unfractionated: 0.35 IU/mL (ref 0.30–0.70)

## 2017-11-04 LAB — CBC
HEMATOCRIT: 28.4 % — AB (ref 39.0–52.0)
Hemoglobin: 8.9 g/dL — ABNORMAL LOW (ref 13.0–17.0)
MCH: 32.7 pg (ref 26.0–34.0)
MCHC: 31.3 g/dL (ref 30.0–36.0)
MCV: 104.4 fL — AB (ref 78.0–100.0)
Platelets: 102 10*3/uL — ABNORMAL LOW (ref 150–400)
RBC: 2.72 MIL/uL — ABNORMAL LOW (ref 4.22–5.81)
RDW: 19.6 % — AB (ref 11.5–15.5)
WBC: 3.8 10*3/uL — ABNORMAL LOW (ref 4.0–10.5)

## 2017-11-04 LAB — MAGNESIUM: Magnesium: 2.4 mg/dL (ref 1.7–2.4)

## 2017-11-04 LAB — PROTIME-INR
INR: 1.74
Prothrombin Time: 20.2 seconds — ABNORMAL HIGH (ref 11.4–15.2)

## 2017-11-04 MED ORDER — POTASSIUM CHLORIDE CRYS ER 20 MEQ PO TBCR
40.0000 meq | EXTENDED_RELEASE_TABLET | Freq: Two times a day (BID) | ORAL | Status: DC
  Administered 2017-11-04 – 2017-11-05 (×3): 40 meq via ORAL
  Filled 2017-11-04 (×3): qty 2

## 2017-11-04 MED ORDER — WARFARIN SODIUM 10 MG PO TABS
10.0000 mg | ORAL_TABLET | Freq: Once | ORAL | Status: AC
  Administered 2017-11-04: 10 mg via ORAL
  Filled 2017-11-04: qty 1

## 2017-11-04 MED ORDER — HEPARIN (PORCINE) IN NACL 100-0.45 UNIT/ML-% IJ SOLN
1150.0000 [IU]/h | INTRAMUSCULAR | Status: DC
  Administered 2017-11-04 – 2017-11-05 (×2): 1250 [IU]/h via INTRAVENOUS
  Filled 2017-11-04 (×2): qty 250

## 2017-11-04 NOTE — Progress Notes (Signed)
Patient reports a satisfactory bowel movement after Dulcolax suppository yesterday, so no enema was given.  He did use a senna tablet last night, but so far, has not had further bowel movements.  He indicates that he does have a tendency for infrequent (every 2 to 3 days), somewhat difficult stools at home.  I have started the patient on MiraLAX here in the hospital, and have instructed him in its use as an outpatient, including generic alternatives, dose titration, etc.  He understands that it is not essential for him to be on this product, but that it may help normalize his bowel function.  Please call if we can be of further assistance with this patient.  Cleotis Nipper, M.D. Pager [phone] If no answer or after 5 PM call [phone]

## 2017-11-04 NOTE — Progress Notes (Signed)
ANTICOAGULATION CONSULT NOTE - Follow-up Consult  Pharmacy Consult for heparin Indication: mechanical aortic valve + Afib  Allergies  Allergen Reactions  . Rifampin Rash    May have been caused by Vancomycin or Rifampin (??)  . Vancomycin Rash    May have been caused by Vancomycin or Rifampin (??)    Patient Measurements: Height: 6\' 2"  (188 cm) Weight: 215 lb 14.4 oz (97.9 kg) IBW/kg (Calculated) : 82.2 Heparin Dosing Weight: n/a   Vital Signs: Temp: 98.6 F (37 C) (08/04 1940) Temp Source: Oral (08/04 1940) BP: 84/50 (08/04 1940) Pulse Rate: 67 (08/04 1940)  Labs: Recent Labs    11/02/17 0537  11/02/17 1333 11/03/17 0500 11/03/17 0643 11/04/17 0424 11/04/17 2029  HGB 8.0*  --   --  6.3* 7.5* 8.9*  --   HCT 26.3*  --   --  21.0* 24.3* 28.4*  --   PLT 113*  --   --  121*  --  102*  --   LABPROT 21.1*  --   --  21.9*  --  20.2*  --   INR 1.84  --   --  1.93  --  1.74  --   HEPARINUNFRC 0.55  --   --  0.64  --  <0.10* 0.35  CREATININE  --    < > 1.41* 1.68*  --  1.45*  --    < > = values in this interval not displayed.    Estimated Creatinine Clearance: 47.2 mL/min (A) (by C-G formula based on SCr of 1.45 mg/dL (H)).   Medical History: Past Medical History:  Diagnosis Date  . Carotid artery disease ([place]) 1994   s/p left carotid endarerectomy   . Chronic atrial fibrillation (HCC)    a. on coumadin   . Chronic diastolic CHF (congestive heart failure) ([place])   . Hx of CABG    a. 1994  . Hypertension   . OSA (obstructive sleep apnea)   . Rheumatic fever   . S/P AVR (aortic valve replacement)    a. mechanical valve 1996  . Subclavian bypass stenosis ([place])   . Vitamin B12 deficiency    Assessment: 82 yo male on chronic Coumadin for mechanical aortic valve + afib. PTA Coumadin dose 5 mg daily except 2.5 mg on Mon, Fri.  INR subtherapeutic at 1.74. On heparin bridge started 8/1, held yesterday for GI studies. Restarting today.  Evening heparin level 0.35.  Hgb up 8.9, plt up to 102. No documented bleeding.  Goal of Therapy:  Heparin level: 0.3-0.7  INR 2.5-3.5 Monitor platelets by anticoagulation protocol: Yes   Plan:  -Continue heparin at 1250units/hr -Will draw 8hr confirmatory heparin level with AM labs -Monitor daily INR, CBC, s/sx of bleeding   Nilaya Bouie A. Levada Dy, PharmD, [place] Pager: [phone] Please utilize Amion for appropriate phone number to reach the unit pharmacist ([place])   11/04/2017    9:19 PM

## 2017-11-04 NOTE — Progress Notes (Signed)
ANTICOAGULATION CONSULT NOTE - Follow-up Consult  Pharmacy Consult for heparin Indication: mechanical aortic valve + Afib  Allergies  Allergen Reactions  . Rifampin Rash    May have been caused by Vancomycin or Rifampin (??)  . Vancomycin Rash    May have been caused by Vancomycin or Rifampin (??)    Patient Measurements: Height: 6\' 2"  (188 cm) Weight: 215 lb 14.4 oz (97.9 kg) IBW/kg (Calculated) : 82.2 Heparin Dosing Weight: n/a   Vital Signs: Temp: 98.1 F (36.7 C) (08/04 0728) Temp Source: Oral (08/04 0728) BP: 93/56 (08/04 0728) Pulse Rate: 73 (08/04 0728)  Labs: Recent Labs    11/02/17 0537  11/02/17 1333 11/03/17 0500 11/03/17 0643 11/04/17 0424  HGB 8.0*  --   --  6.3* 7.5* 8.9*  HCT 26.3*  --   --  21.0* 24.3* 28.4*  PLT 113*  --   --  121*  --  102*  LABPROT 21.1*  --   --  21.9*  --  20.2*  INR 1.84  --   --  1.93  --  1.74  HEPARINUNFRC 0.55  --   --  0.64  --  <0.10*  CREATININE  --    < > 1.41* 1.68*  --  1.45*   < > = values in this interval not displayed.    Estimated Creatinine Clearance: 47.2 mL/min (A) (by C-G formula based on SCr of 1.45 mg/dL (H)).   Medical History: Past Medical History:  Diagnosis Date  . Carotid artery disease ([place]) 1994   s/p left carotid endarerectomy   . Chronic atrial fibrillation (HCC)    a. on coumadin   . Chronic diastolic CHF (congestive heart failure) ([place])   . Hx of CABG    a. 1994  . Hypertension   . OSA (obstructive sleep apnea)   . Rheumatic fever   . S/P AVR (aortic valve replacement)    a. mechanical valve 1996  . Subclavian bypass stenosis ([place])   . Vitamin B12 deficiency    Assessment: 82 yo male on chronic Coumadin for mechanical aortic valve + afib. PTA Coumadin dose 5 mg daily except 2.5 mg on Mon, Fri.  INR subtherapeutic at 1.74. On heparin bridge started 8/1, held yesterday for GI studies. Restarting today.  Morning heparin level undetectable. Hgb up 8.9, plt up to 102. No documented  bleeding.  Goal of Therapy:  Heparin level: 0.3-0.7  INR 2.5-3.5 Monitor platelets by anticoagulation protocol: Yes   Plan:  -Restart heparin 1250units/hr -Will draw 8hr heparin level -Monitor daily INR, CBC, s/sx of bleeding -Warfarin 10 mg x 1 tonight  -F/u anticoag discharge plan, may need lovenox  Harrietta Guardian, PharmD PGY1 Pharmacy Resident 11/04/2017    10:39 AM

## 2017-11-04 NOTE — Progress Notes (Signed)
Patient ID: [name], male   DOB: January 24, 1936, 82 y.o.   MRN: [id number]     Advanced Heart Failure Rounding Note  PCP-Cardiologist: [name], MD   Subjective:    IV Lasix and metolazone stopped 8/2, transitioned to po torsemide. Weight down again today.   Creatinine 1.2->1.5 ->1.38 -> 1.28 -> 1.34 -> 1.34 -> 1.66 -> 1.45. SBP 90s-100s.    INR lower today at 1.74.  Yesterday, hgb down to 6.3 and he felt lethargic.  I asked GI to see, he was FOBT negative and no recommendation for scope.  Probably anemia of chronic disease/renal disease.  We transfused 1 unit PRBCs.   He is more awake/alert today, feels better.  Hgb up to 8.9.   TEE (7/23): showed EF 55-60%, mildly dilated/dysfunctional RV, severe MR likely mixed primary and secondary etiology with minimal mitral stenosis.  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   Objective:   Weight Range: 215 lb 14.4 oz (97.9 kg) Body mass index is 27.72 kg/m.   Vital Signs:   Temp:  [98.1 F (36.7 C)-98.4 F (36.9 C)] 98.1 F (36.7 C) (08/04 0728) Pulse Rate:  [55-79] 73 (08/04 0728) Resp:  [16-21] 21 (08/04 0728) BP: (84-101)/(51-65) 93/56 (08/04 0728) SpO2:  [85 %-100 %] 85 % (08/04 0728) Weight:  [215 lb 14.4 oz (97.9 kg)] 215 lb 14.4 oz (97.9 kg) (08/04 0431) Last BM Date: 11/03/17  Weight change: Filed Weights   11/02/17 0448 11/03/17 0400 11/04/17 0431  Weight: 221 lb 1.6 oz (100.3 kg) 221 lb 9 oz (100.5 kg) 215 lb 14.4 oz (97.9 kg)   Intake/Output:   Intake/Output Summary (Last 24 hours) at 11/04/2017 1019 Last data filed at 11/04/2017 0900 Gross per 24 hour  Intake 1193.72 ml  Output 2350 ml  Net -1156.28 ml    Physical Exam    General: NAD Neck: JVP 8 cm, no thyromegaly or thyroid nodule.  Lungs: Clear to auscultation bilaterally with normal respiratory effort. CV: Nondisplaced PMI.  Heart regular S1/S2 with mechanical S1, no S3/S4, 2/6 HSM apex.  Trace ankle edema.  Abdomen: Soft, nontender, no  hepatosplenomegaly, no distention.  Skin: Intact without lesions or rashes.  Neurologic: Alert and oriented x 3.  Psych: Normal affect. Extremities: No clubbing or cyanosis.  HEENT: Normal.    Telemetry   Afib 70s. Personally reviewed.   Labs    CBC Recent Labs    11/03/17 0500 11/03/17 0643 11/04/17 0424  WBC 4.3  --  3.8*  HGB 6.3* 7.5* 8.9*  HCT 21.0* 24.3* 28.4*  MCV 109.4*  --  104.4*  PLT 121*  --  245*   Basic Metabolic Panel Recent Labs    11/03/17 0500 11/04/17 0424  NA 137 137  K 3.3* 3.1*  CL 88* 90*  CO2 38* 35*  GLUCOSE 103* 108*  BUN 60* 56*  CREATININE 1.68* 1.45*  CALCIUM 9.2 9.3   Liver Function Tests No results for input(s): AST, ALT, ALKPHOS, BILITOT, PROT, ALBUMIN in the last 72 hours. No results for input(s): LIPASE, AMYLASE in the last 72 hours. Cardiac Enzymes No results for input(s): CKTOTAL, CKMB, CKMBINDEX, TROPONINI in the last 72 hours.  BNP: BNP (last 3 results) Recent Labs    08/01/17 1804 10/18/17 1510  BNP 784.4* 1,073.3*    ProBNP (last 3 results) Recent Labs    03/30/17 1147 05/25/17 1044 07/10/17 1523  PROBNP 1,858* 2,749* 411.0*     D-Dimer No results for input(s): DDIMER in the last  72 hours. Hemoglobin A1C No results for input(s): HGBA1C in the last 72 hours. Fasting Lipid Panel No results for input(s): CHOL, HDL, LDLCALC, TRIG, CHOLHDL, LDLDIRECT in the last 72 hours. Thyroid Function Tests No results for input(s): TSH, T4TOTAL, T3FREE, THYROIDAB in the last 72 hours.  Invalid input(s): FREET3  Other results:   Imaging    No results found.   Medications:     Scheduled Medications: . colchicine  0.6 mg Oral Daily  . docusate sodium  100 mg Oral BID  . ezetimibe-simvastatin  1 tablet Oral QHS  . folic acid  1 mg Oral Daily  . insulin aspart  0-15 Units Subcutaneous TID WC  . pantoprazole  40 mg Oral Daily  . polyethylene glycol  17 g Oral Daily  . potassium chloride  40 mEq Oral BID    . QUEtiapine  25 mg Oral QHS  . sodium chloride flush  3 mL Intravenous Q12H  . torsemide  80 mg Oral BID  . vitamin B-12  3,000 mcg Oral Daily  . Warfarin - Pharmacist Dosing Inpatient   Does not apply q1800    Infusions: . sodium chloride      PRN Medications: sodium chloride, acetaminophen, fluocinonide cream, ipratropium-albuterol, ondansetron (ZOFRAN) IV, oxymetazoline, senna, sodium chloride flush, sodium chloride flush, sodium phosphate    Patient Profile   [name] is a 82 y.o. male with h/o mechanical AVR 1994, OSA on CPAP, Chronic afib, chronic combined CHF, CKD III, chronic coumadin therapy, CAD s/p CAGB 1994, and COPD.   Admitted from [facility] office 10/18/17 with Acute on chronic diastolic CHF.   Assessment/Plan   1. Acute on chronic diastolic CHF with prominent RV failure:  Echo this admission showed EF 55-65%, mechanical aortic valve functioning normally, mild mitral stenosis/severe mitral regurgitation, RV moderately dilated with moderately decreased systolic function, PASP 49 mmHg.  Severe MR could be contributing to the significant RV failure.  He has been extensively diuresed with weight down > 70 lbs.  SBP stable today in 90s-100s.  Feeling better after transfusion.  Weight down again.  - Torsemide 80 mg bid with KCl 40 mEq bid.  - Off spironolactone and Coreg with low BP.  3. Mechanical aortic valve: Appears to function well on echo this admission.  - Continue warfarin with INR goal 2.5-3.5 (also with atrial fibrillation).  - INR 1.7 today, restart heparin gtt (no overt GI bleeding). May need Lovenox bridge at discharge.  4. Atrial fibrillation: Chronic. Rate controlled 70s.  5. CAD: s/p CABG.  No chest pain.  - Continue Vytorin.  6. COPD: On home oxygen. No change.  7. OSA: Continue home CPAP. No change.  8. Acute on CKD: Stage 3.  Creatinine lower at 1.45.  - Continue po torsemide.   - Stop all BP active meds to promote higher BP.  9. Anemia: Hgb had  been stable in hospital around 8, but trended down to 6.3.  Guaiac negative, no overt bleeding.  He had 1 units PRBCs with appropriate rise.  GI saw, no scopes indicated.  Suspect anemia of chronic disease/renal disease.   - Continue Protonix.  10. Deconditioning: He will need HH PT/OT and 24 hour supervision after discharge.  11. Thrombocytopenia: This appears chronic, had before this admission. No change 12. 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 Structural Heart team evaluated. It does not appear that he will  be a Mitraclip candidate. Hs been seen by Dr. [name] who recommended aggressive medical therapy versus possible experimental percutaneous MVR as he felt very high risk for conventional surgery. No change.  13. Gout: Suspect this is cause of right ankle pain.  Uric acid is high.  Some improvement in pain.  Will give colchicine once daily.   INR subtherapeutic, starting his home dose of po torsemide.  Will mobilize today, watch one more day in hospital then discharge hopefully to home tomorrow. May need a Lovenox bridge.   Loralie Champagne 11/04/2017 10:19 AM

## 2017-11-05 ENCOUNTER — Ambulatory Visit: Payer: Medicare Other

## 2017-11-05 ENCOUNTER — Ambulatory Visit: Payer: Medicare Other | Admitting: Internal Medicine

## 2017-11-05 LAB — BASIC METABOLIC PANEL
Anion gap: 11 (ref 5–15)
BUN: 54 mg/dL — AB (ref 8–23)
CHLORIDE: 92 mmol/L — AB (ref 98–111)
CO2: 35 mmol/L — AB (ref 22–32)
Calcium: 9.2 mg/dL (ref 8.9–10.3)
Creatinine, Ser: 1.32 mg/dL — ABNORMAL HIGH (ref 0.61–1.24)
GFR calc Af Amer: 57 mL/min — ABNORMAL LOW (ref >60)
GFR calc non Af Amer: 49 mL/min — ABNORMAL LOW (ref >60)
Glucose, Bld: 107 mg/dL — ABNORMAL HIGH (ref 70–99)
POTASSIUM: 3.6 mmol/L (ref 3.5–5.1)
SODIUM: 138 mmol/L (ref 135–145)

## 2017-11-05 LAB — PROTIME-INR
INR: 1.85
PROTHROMBIN TIME: 21.2 s — AB (ref 11.4–15.2)

## 2017-11-05 LAB — CBC
HEMATOCRIT: 30 % — AB (ref 39.0–52.0)
Hemoglobin: 9.3 g/dL — ABNORMAL LOW (ref 13.0–17.0)
MCH: 32.4 pg (ref 26.0–34.0)
MCHC: 31 g/dL (ref 30.0–36.0)
MCV: 104.5 fL — AB (ref 78.0–100.0)
PLATELETS: 122 10*3/uL — AB (ref 150–400)
RBC: 2.87 MIL/uL — ABNORMAL LOW (ref 4.22–5.81)
RDW: 18.9 % — ABNORMAL HIGH (ref 11.5–15.5)
WBC: 3.6 10*3/uL — AB (ref 4.0–10.5)

## 2017-11-05 LAB — URINE CULTURE

## 2017-11-05 LAB — GLUCOSE, CAPILLARY
GLUCOSE-CAPILLARY: 114 mg/dL — AB (ref 70–99)
Glucose-Capillary: 109 mg/dL — ABNORMAL HIGH (ref 70–99)

## 2017-11-05 LAB — HEPARIN LEVEL (UNFRACTIONATED): HEPARIN UNFRACTIONATED: 0.72 [IU]/mL — AB (ref 0.30–0.70)

## 2017-11-05 MED ORDER — PANTOPRAZOLE SODIUM 40 MG PO TBEC: 40.0000 mg | DELAYED_RELEASE_TABLET | Freq: Every day | ORAL | 0 refills | Status: DC

## 2017-11-05 MED ORDER — WARFARIN SODIUM 10 MG PO TABS: 10.0000 mg | ORAL_TABLET | Freq: Once | ORAL | Status: DC

## 2017-11-05 MED ORDER — POLYETHYLENE GLYCOL 3350 17 G PO PACK: 17.0000 g | PACK | Freq: Every day | ORAL | 0 refills | Status: DC

## 2017-11-05 MED ORDER — POTASSIUM CHLORIDE CRYS ER 20 MEQ PO TBCR: 60.0000 meq | EXTENDED_RELEASE_TABLET | Freq: Two times a day (BID) | ORAL | 6 refills | Status: DC

## 2017-11-05 MED ORDER — TORSEMIDE 20 MG PO TABS: 80.0000 mg | ORAL_TABLET | Freq: Two times a day (BID) | ORAL | 6 refills | Status: DC

## 2017-11-05 MED ORDER — ENOXAPARIN SODIUM 100 MG/ML ~~LOC~~ SOLN: 100.0000 mg | Freq: Two times a day (BID) | SUBCUTANEOUS | 0 refills | Status: DC

## 2017-11-05 MED ORDER — SENNA 8.6 MG PO TABS: 1.0000 | ORAL_TABLET | Freq: Every day | ORAL | 0 refills | Status: DC | PRN

## 2017-11-05 MED ORDER — ENOXAPARIN SODIUM 100 MG/ML ~~LOC~~ SOLN
1.0000 mg/kg | Freq: Once | SUBCUTANEOUS | Status: AC
  Administered 2017-11-05: 100 mg via SUBCUTANEOUS
  Filled 2017-11-05: qty 1

## 2017-11-05 MED ORDER — POTASSIUM CHLORIDE CRYS ER 20 MEQ PO TBCR
40.0000 meq | EXTENDED_RELEASE_TABLET | Freq: Once | ORAL | Status: AC
  Administered 2017-11-05: 40 meq via ORAL
  Filled 2017-11-05: qty 2

## 2017-11-05 MED ORDER — COLCHICINE 0.6 MG PO TABS: 0.6000 mg | ORAL_TABLET | Freq: Every day | ORAL | 0 refills | Status: DC

## 2017-11-05 MED ORDER — WARFARIN SODIUM 5 MG PO TABS: ORAL_TABLET | ORAL | 6 refills | Status: DC

## 2017-11-05 MED ORDER — DOCUSATE SODIUM 100 MG PO CAPS: 100.0000 mg | ORAL_CAPSULE | Freq: Two times a day (BID) | ORAL | 0 refills | Status: DC

## 2017-11-05 MED FILL — ENOXAPARIN 100 MG/ML SYR: 100 | 3 days supply | Qty: 6 | Fill #0

## 2017-11-05 NOTE — Progress Notes (Signed)
Physical Therapy Treatment Patient Details Name: [name] MRN: [id number] DOB: 09-17-1935 Today's Date: 11/05/2017    History of Present Illness 82 y.o. male with PMH of mechanical aortic valve (1994), OSA on CPAP, a-fib, HF, CKD III,  prior CABG, and COPD, admitted 10/18/17 with progressive abdominal and LE swelling with anasarca. Worked up for acute on chronic diastolic CHF with prominent RV failure. Recommended aggressive medical therapy vs possible experimental percutaneous MVR as pt considered very high risk for conventional sx.     PT Comments    Pt was seen for evaluation of mobility and strength, with decreased energy to walk having just walked with cardiac rehab nurse.  He is motivated to do LE strengthening and reviewed his routine with wife in attendance.  Talked with pt and wife about cardiac rehab follow up and they are expecting this as well as PT at home.  Follow acutely for strengthening and balance, endurance with monitoring of vitals with therapy.   Follow Up Recommendations  Home health PT;Supervision/Assistance - 24 hour     Equipment Recommendations  None recommended by PT    Recommendations for Other Services OT consult     Precautions / Restrictions Precautions Precautions: Fall Precaution Comments: 2 months ago tripped on throw rug, all throw rugs gone from home Restrictions Weight Bearing Restrictions: No    Mobility  Bed Mobility               General bed mobility comments: up in chair when PT arrived  Transfers Overall transfer level: Needs assistance Equipment used: 1 person hand held assist Transfers: Sit to/from Stand Sit to Stand: Supervision;Min guard         General transfer comment: working on stands with ther ex  Ambulation/Gait             General Gait Details: just walked with cardiac rehab   Stairs             Wheelchair Mobility    Modified Rankin (Stroke Patients Only)       Balance     Sitting  balance-Leahy Scale: Good       Standing balance-Leahy Scale: Poor                              Cognition Arousal/Alertness: Awake/alert Behavior During Therapy: WFL for tasks assessed/performed;Flat affect Overall Cognitive Status: Within Functional Limits for tasks assessed                                        Exercises General Exercises - Lower Extremity Ankle Circles/Pumps: AROM;Both;5 reps Gluteal Sets: Strengthening;Both;10 reps Long Arc Quad: Strengthening;Both;10 reps;Seated Heel Slides: Strengthening;Both;10 reps;Seated Hip ABduction/ADduction: Strengthening;Both;10 reps;Seated Hip Flexion/Marching: AROM;Both;10 reps    General Comments General comments (skin integrity, edema, etc.): requiring O2 in chair      Pertinent Vitals/Pain Pain Assessment: No/denies pain    Home Living                      Prior Function            PT Goals (current goals can now be found in the care plan section) Acute Rehab PT Goals Patient Stated Goal: to go home  Progress towards PT goals: Progressing toward goals    Frequency    Min 3X/week  PT Plan Current plan remains appropriate;Discharge plan needs to be updated    Co-evaluation              AM-PAC PT "6 Clicks" Daily Activity  Outcome Measure  Difficulty turning over in bed (including adjusting bedclothes, sheets and blankets)?: A Little Difficulty moving from lying on back to sitting on the side of the bed? : A Little Difficulty sitting down on and standing up from a chair with arms (e.g., wheelchair, bedside commode, etc,.)?: Unable Help needed moving to and from a bed to chair (including a wheelchair)?: A Little Help needed walking in hospital room?: A Little Help needed climbing 3-5 steps with a railing? : A Little 6 Click Score: 16    End of Session Equipment Utilized During Treatment: Oxygen Activity Tolerance: Patient tolerated treatment well;Patient  limited by fatigue Patient left: in chair;with call bell/phone within reach;with family/visitor present Nurse Communication: Mobility status PT Visit Diagnosis: Unsteadiness on feet (R26.81);Other abnormalities of gait and mobility (R26.89);Muscle weakness (generalized) (M62.81);History of falling (Z91.81)     Time: 8756-4332 PT Time Calculation (min) (ACUTE ONLY): 31 min  Charges:  $Therapeutic Exercise: 8-22 mins $Therapeutic Activity: 8-22 mins                  Ramond Dial 11/05/2017, 10:55 AM   Mee Hives, PT MS Acute Rehab Dept. Number: [place] [facility] [place]

## 2017-11-05 NOTE — Progress Notes (Addendum)
ANTICOAGULATION CONSULT NOTE - Follow-up Consult  Pharmacy Consult for heparin Indication: mechanical aortic valve + Afib  Allergies  Allergen Reactions  . Rifampin Rash    May have been caused by Vancomycin or Rifampin (??)  . Vancomycin Rash    May have been caused by Vancomycin or Rifampin (??)    Patient Measurements: Height: 6\' 2"  (188 cm) Weight: 216 lb 4.8 oz (98.1 kg) IBW/kg (Calculated) : 82.2 Heparin Dosing Weight: n/a   Vital Signs: Temp: 97.5 F (36.4 C) (08/05 0750) Temp Source: Oral (08/05 0750) BP: 93/55 (08/05 0750) Pulse Rate: 73 (08/05 0750)  Labs: Recent Labs    11/03/17 0500 11/03/17 0643 11/04/17 0424 11/04/17 2029 11/05/17 0433  HGB 6.3* 7.5* 8.9*  --  9.3*  HCT 21.0* 24.3* 28.4*  --  30.0*  PLT 121*  --  102*  --  122*  LABPROT 21.9*  --  20.2*  --  21.2*  INR 1.93  --  1.74  --  1.85  HEPARINUNFRC 0.64  --  <0.10* 0.35 0.72*  CREATININE 1.68*  --  1.45* 1.27* 1.32*    Estimated Creatinine Clearance: 51.9 mL/min (A) (by C-G formula based on SCr of 1.32 mg/dL (H)).   Medical History: Past Medical History:  Diagnosis Date  . Carotid artery disease ([place]) 1994   s/p left carotid endarerectomy   . Chronic atrial fibrillation (HCC)    a. on coumadin   . Chronic diastolic CHF (congestive heart failure) ([place])   . Hx of CABG    a. 1994  . Hypertension   . OSA (obstructive sleep apnea)   . Rheumatic fever   . S/P AVR (aortic valve replacement)    a. mechanical valve 1996  . Subclavian bypass stenosis ([place])   . Vitamin B12 deficiency    Assessment: 82 yo male on chronic Coumadin for mechanical aortic valve + afib. PTA Coumadin dose 5 mg daily except 2.5 mg on Mon, Fri.  INR subtherapeutic at 1.85 but trending up. Pt remains on IV heparin bridge, CBC stable. No endoscopy per GI, FOBT negative.  Goal of Therapy:  Heparin level: 0.3-0.7  INR 2.5-3.5 Monitor platelets by anticoagulation protocol: Yes   Plan:  -Heparin at 1150  units/hr -Warfarin 10mg  PO x1 again tonight to boost INR -Daily INR, heparin level, CBC  ADDENDUM: Pt to be discharged today. Per [name] give enoxaparin bridge until INR >2.0. Will stop IV heparin prior to discharge and give 1mg /kg (100mg ) LMWH SQ x1. Pt will then start Lovenox 100mg  SQ q12h tomorrow until INR >2. Pt will take warfarin 10mg  tonight then 7.5mg  daily until seen in Coumadin clinic. Will schedule Coumadin clinic INR check on Wed or Thurs.  Arrie Senate, PharmD, BCPS Clinical Pharmacist [phone] Please check AMION for all [place] numbers 11/05/2017

## 2017-11-05 NOTE — Progress Notes (Signed)
ANTICOAGULATION CONSULT NOTE - Follow-up Consult  Pharmacy Consult for Heparin Indication: mechanical aortic valve + Afib  Allergies  Allergen Reactions  . Rifampin Rash    May have been caused by Vancomycin or Rifampin (??)  . Vancomycin Rash    May have been caused by Vancomycin or Rifampin (??)    Patient Measurements: Height: 6\' 2"  (188 cm) Weight: 215 lb 14.4 oz (97.9 kg) IBW/kg (Calculated) : 82.2 Heparin Dosing Weight: n/a   Vital Signs: Temp: 98 F (36.7 C) (08/05 0454) Temp Source: Oral (08/05 0454) BP: 95/59 (08/05 0454) Pulse Rate: 71 (08/05 0454)  Labs: Recent Labs    11/03/17 0500 11/03/17 0643 11/04/17 0424 11/04/17 2029 11/05/17 0433  HGB 6.3* 7.5* 8.9*  --  9.3*  HCT 21.0* 24.3* 28.4*  --  30.0*  PLT 121*  --  102*  --  122*  LABPROT 21.9*  --  20.2*  --  21.2*  INR 1.93  --  1.74  --  1.85  HEPARINUNFRC 0.64  --  <0.10* 0.35 0.72*  CREATININE 1.68*  --  1.45* 1.27* 1.32*    Estimated Creatinine Clearance: 51.9 mL/min (A) (by C-G formula based on SCr of 1.32 mg/dL (H)).   Medical History: Past Medical History:  Diagnosis Date  . Carotid artery disease ([place]) 1994   s/p left carotid endarerectomy   . Chronic atrial fibrillation (HCC)    a. on coumadin   . Chronic diastolic CHF (congestive heart failure) ([place])   . Hx of CABG    a. 1994  . Hypertension   . OSA (obstructive sleep apnea)   . Rheumatic fever   . S/P AVR (aortic valve replacement)    a. mechanical valve 1996  . Subclavian bypass stenosis ([place])   . Vitamin B12 deficiency    Assessment: 82 yo male on chronic Coumadin for mechanical aortic valve + afib. PTA Coumadin dose 5 mg daily except 2.5 mg on Mon, Fri.  INR subtherapeutic at 1.74. On heparin bridge started 8/1, held yesterday for GI studies. Restarting today.  8/5 AM update: heparin level just above goal this AM, INR up to 1.85, Hgb stable  Goal of Therapy:  Heparin level: 0.3-0.7 units/mL INR 2.5-3.5 Monitor  platelets by anticoagulation protocol: Yes   Plan:  Dec heparin to 1150 units/hr [place], PharmD, [place] Pharmacist Phone: [phone]

## 2017-11-05 NOTE — Progress Notes (Addendum)
Patient ID: [name], male   DOB: 1935-07-26, 82 y.o.   MRN: [id number]     Advanced Heart Failure Rounding Note  PCP-Cardiologist: [name], MD   Subjective:    IV Lasix and metolazone stopped 8/2, transitioned to po torsemide. Good UOP. Weight up 1 lb today.   Creatinine 1.2->1.5 ->1.38 -> 1.28 -> 1.34 -> 1.34 -> 1.66 -> 1.45 -> 1.32. SBP 80-90s   INR 1.85 today. On heparin and coumadin.  Hemoglobin 9.3 (s/p 1 unit PRBCs 8/3). GI saw and had no recommendation for scope. FOBT negative.   Denies CP, SOB, dizziness, or bleeding.   TEE (7/23): showed EF 55-60%, mildly dilated/dysfunctional RV, severe MR likely mixed primary and secondary etiology with minimal mitral stenosis.  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   Objective:   Weight Range: 216 lb 4.8 oz (98.1 kg) Body mass index is 27.77 kg/m.   Vital Signs:   Temp:  [97.5 F (36.4 C)-98.6 F (37 C)] 97.5 F (36.4 C) (08/05 0750) Pulse Rate:  [67-80] 73 (08/05 0750) Resp:  [18] 18 (08/05 0750) BP: (81-95)/(48-59) 93/55 (08/05 0750) SpO2:  [92 %-100 %] 100 % (08/05 0750) Weight:  [216 lb 4.8 oz (98.1 kg)] 216 lb 4.8 oz (98.1 kg) (08/05 0500) Last BM Date: 11/03/17  Weight change: Filed Weights   11/03/17 0400 11/04/17 0431 11/05/17 0500  Weight: 221 lb 9 oz (100.5 kg) 215 lb 14.4 oz (97.9 kg) 216 lb 4.8 oz (98.1 kg)   Intake/Output:   Intake/Output Summary (Last 24 hours) at 11/05/2017 0837 Last data filed at 11/05/2017 0657 Gross per 24 hour  Intake 1088.74 ml  Output 2625 ml  Net -1536.26 ml    Physical Exam    CVP 11 General:  No resp difficulty. HEENT: Normal Neck: Supple. JVP 10. Carotids 2+ bilat; no bruits. No thyromegaly or nodule noted. Cor: PMI nondisplaced. IRR, mechanical S1, 2/6 HSM apex Lungs: CTAB, normal effort. Abdomen: Soft, non-tender, non-distended, no HSM. No bruits or masses. +BS  Extremities: No cyanosis, clubbing, or rash. R and LLE no edema.  Neuro: Alert &  orientedx3, cranial nerves grossly intact. moves all 4 extremities w/o difficulty. Affect pleasant   Telemetry   Afib 70s. Personally reviewed.   Labs    CBC Recent Labs    11/04/17 0424 11/05/17 0433  WBC 3.8* 3.6*  HGB 8.9* 9.3*  HCT 28.4* 30.0*  MCV 104.4* 104.5*  PLT 102* 188*   Basic Metabolic Panel Recent Labs    11/04/17 2029 11/05/17 0433  NA 132* 138  K 3.1* 3.6  CL 88* 92*  CO2 31 35*  GLUCOSE 116* 107*  BUN 52* 54*  CREATININE 1.27* 1.32*  CALCIUM 8.7* 9.2  MG 2.4  --    Liver Function Tests No results for input(s): AST, ALT, ALKPHOS, BILITOT, PROT, ALBUMIN in the last 72 hours. No results for input(s): LIPASE, AMYLASE in the last 72 hours. Cardiac Enzymes No results for input(s): CKTOTAL, CKMB, CKMBINDEX, TROPONINI in the last 72 hours.  BNP: BNP (last 3 results) Recent Labs    08/01/17 1804 10/18/17 1510  BNP 784.4* 1,073.3*    ProBNP (last 3 results) Recent Labs    03/30/17 1147 05/25/17 1044 07/10/17 1523  PROBNP 1,858* 2,749* 411.0*     D-Dimer No results for input(s): DDIMER in the last 72 hours. Hemoglobin A1C No results for input(s): HGBA1C in the last 72 hours. Fasting Lipid Panel No results for input(s): CHOL, HDL, LDLCALC, TRIG, CHOLHDL,  LDLDIRECT in the last 72 hours. Thyroid Function Tests No results for input(s): TSH, T4TOTAL, T3FREE, THYROIDAB in the last 72 hours.  Invalid input(s): FREET3  Other results:   Imaging    No results found.   Medications:     Scheduled Medications: . colchicine  0.6 mg Oral Daily  . docusate sodium  100 mg Oral BID  . ezetimibe-simvastatin  1 tablet Oral QHS  . folic acid  1 mg Oral Daily  . insulin aspart  0-15 Units Subcutaneous TID WC  . pantoprazole  40 mg Oral Daily  . polyethylene glycol  17 g Oral Daily  . potassium chloride  40 mEq Oral BID  . QUEtiapine  25 mg Oral QHS  . sodium chloride flush  3 mL Intravenous Q12H  . torsemide  80 mg Oral BID  . vitamin  B-12  3,000 mcg Oral Daily  . Warfarin - Pharmacist Dosing Inpatient   Does not apply q1800    Infusions: . sodium chloride    . heparin 1,150 Units/hr (11/05/17 0712)    PRN Medications: sodium chloride, acetaminophen, fluocinonide cream, ipratropium-albuterol, ondansetron (ZOFRAN) IV, oxymetazoline, senna, sodium chloride flush, sodium chloride flush, sodium phosphate    Patient Profile   [name] is a 82 y.o. male with h/o mechanical AVR 1994, OSA on CPAP, Chronic afib, chronic combined CHF, CKD III, chronic coumadin therapy, CAD s/p CAGB 1994, and COPD.   Admitted from [facility] office 10/18/17 with Acute on chronic diastolic CHF.   Assessment/Plan   1. Acute on chronic diastolic CHF with prominent RV failure:  Echo this admission showed EF 55-65%, mechanical aortic valve functioning normally, mild mitral stenosis/severe mitral regurgitation, RV moderately dilated with moderately decreased systolic function, PASP 49 mmHg.  Severe MR could be contributing to the significant RV failure.  He has been extensively diuresed with weight down > 70 lbs.  SBP 80-90s. Volume status stable. CVP 11 - Continue Torsemide 80 mg bid with KCl 40 mEq bid. K 3.6. Add additional 40 meq x1 today.  - Off spironolactone and Coreg with low BP.  3. Mechanical aortic valve: Appears to function well on echo this admission.  - Continue warfarin with INR goal 2.5-3.5 (also with atrial fibrillation).  - INR 1.85 on heparin and coumadin. May need lovenox for DC.   4. Atrial fibrillation: Chronic. Rate controlled 70s. No change.  5. CAD: s/p CABG.  No CP.   - Continue Vytorin.  6. COPD: On home oxygen. No change.  7. OSA: Continue home CPAP. No change.  8. Acute on CKD: Stage 3.  Creatinine lower at 1.32  - Continue po torsemide.   - Stop all BP active meds to promote higher BP.  9. Anemia: Hgb had been stable in hospital around 8, but trended down to 6.3.  Guaiac negative, no overt bleeding.  He had 1 units  PRBCs with appropriate rise.  GI saw, no scopes indicated.  Suspect anemia of chronic disease/renal disease.  Hemoglobin 9.3 today. Denies bleeding.  - Continue Protonix.  10. Deconditioning: He will need HH PT/OT and 24 hour supervision after discharge. No change.  11. Thrombocytopenia: This appears chronic, had before this admission. No change 12. 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 Structural Heart team evaluated. It does not appear that he will be a Mitraclip candidate. Hs been seen by Dr. [name] who recommended aggressive medical  therapy versus possible experimental percutaneous MVR as he felt very high risk for conventional surgery. No change.  13. Gout: Suspect this is cause of right ankle pain.  Uric acid is high.  Some improvement in pain.  Continue colchicine.  Consider allopurinol after current flare is improved.   Plan for DC today. Will discuss with [name].   Georgiana Shore 11/05/2017 8:37 AM  Patient seen with NP, agree with the above note.    He is stable today clinically.  Tolerating current torsemide dosing, weight stable.  SBP in 90s, now off Coreg and spironolactone with low BP.  May be able to restart in future, will follow.  Creatinine and hgb stable.   INR 1.85, goal 2.5-3.5.  Will cover with Lovenox until INR > 2 on warfarin.   He can go home today.  He will need close followup in CHF clinic in 10-14 days.  He will need BMET in a week.   Loralie Champagne 11/05/2017 3:07 PM

## 2017-11-05 NOTE — Care Management Note (Signed)
Case Management Note  Patient Details  Name: [name] MRN: [id number] Date of Birth: 1936/03/29  Subjective/Objective:                    Action/Plan: Pt discharging home with [facility] services. Pt already set up with [facility] for [facility]. Jermaine with [facility] made aware of this d/c. CM spoke to patients bedside RN and patient has already received ordered DME and wife has taken this home. Pt uses oxygen at home and wife has tank for transportation home.  CM called to see the cost of the lovenox. Pt's co pay is $5. CM also called to see the co pay for the cochicine. His co pay is $5.  Wife to provide transportation home.   Expected Discharge Date:  11/05/17               Expected Discharge Plan:  [place]  In-House Referral:  NA  Discharge planning Services  CM Consult  Post Acute Care Choice:  Durable Medical Equipment, Home Health Choice offered to:  Patient  DME Arranged:  Walker rolling with seat, 3-N-1 DME Agency:  [place]:  RN, Disease Management, PT Avis Agency:  [place]  Status of Service:  Completed, signed off  If discussed at [place] of Stay Meetings, dates discussed:    Additional Comments:  Pollie Friar, RN 11/05/2017, 5:16 PM

## 2017-11-05 NOTE — Progress Notes (Signed)
CARDIAC REHAB PHASE I   PRE:  Rate/Rhythm: 71 afib    BP: sitting 98/75    SaO2: 100 2L  MODE:  Ambulation: 70 ft   POST:  Rate/Rhythm: 88 afib with PVCs    BP: sitting 91/56     SaO2: 96 3L  Pt doing fairly well this am. Able to get up mostly independent with raised bed. Used RW, assist x2 with gait belt, 3L O2. [place] today, no as tremulous.  Pt aware of when he was fatiguing and turned around. Good mechanics getting to recliner. No major c/o. Left in recliner on chair alarm. Wife not present to review ed.  [place], ACSM 11/05/2017 9:07 AM

## 2017-11-05 NOTE — Progress Notes (Signed)
Occupational Therapy Treatment Patient Details Name: [name] MRN: [id number] DOB: 10-28-1935 Today's Date: 11/05/2017    History of present illness 82 y.o. male with PMH of mechanical aortic valve (1994), OSA on CPAP, a-fib, HF, CKD III,  prior CABG, and COPD, admitted 10/18/17 with progressive abdominal and LE swelling with anasarca. Worked up for acute on chronic diastolic CHF with prominent RV failure. Recommended aggressive medical therapy vs possible experimental percutaneous MVR as pt considered very high risk for conventional sx.    OT comments  Patient pleasant and cooperative, willing to participate in session and progressing well.  VSS throughout session, able to complete bed mobility with supervision but limited by bloody nose once sitting EOB (nursing notified and present during session).  Patient able to verbalize adaptive equipment planning to use at home including reacher, sock aide and long handled sponge, but requires cueing to recall energy conservation techniques.  Will continue to follow while admitted.    Follow Up Recommendations  Home health OT;Supervision - Intermittent    Equipment Recommendations  Other (comment)(long handled sponge)    Recommendations for Other Services      Precautions / Restrictions Precautions Precautions: Fall Precaution Comments: 2 months ago tripped on throw rug, all throw rugs gone from home Restrictions Weight Bearing Restrictions: No       Mobility Bed Mobility Overal bed mobility: Needs Assistance Bed Mobility: Supine to Sit     Supine to sit: Supervision;HOB elevated     General bed mobility comments: increased time and effort, no phyiscal assistance required  Transfers                 General transfer comment: session completed EOB only    Balance   Sitting-balance support: Feet supported;No upper extremity supported Sitting balance-Leahy Scale: Good                                      ADL either performed or assessed with clinical judgement   ADL Overall ADL's : Needs assistance/impaired             Lower Body Bathing: Supervison/ safety Lower Body Bathing Details (indicate cue type and reason): reviewed safety completing seated using long handled sponge        Lower Body Dressing Details (indicate cue type and reason): reviewed techniques using sock aide and reacher, verbalized understanding of education                General ADL Comments: Limited mobility during session due to bloody nose, reviewed adaptive equipment and energy conservation techniques.      Vision       Perception     Praxis      Cognition Arousal/Alertness: Awake/alert Behavior During Therapy: WFL for tasks assessed/performed;Flat affect Overall Cognitive Status: Within Functional Limits for tasks assessed                                          Exercises     Shoulder Instructions       General Comments bloody nose once sitting EOB, RN aware and present during session     Pertinent Vitals/ Pain       Pain Assessment: No/denies pain  Home Living  Prior Functioning/Environment              Frequency  Min 2X/week        Progress Toward Goals  OT Goals(current goals can now be found in the care plan section)  Progress towards OT goals: Progressing toward goals  Acute Rehab OT Goals Patient Stated Goal: to go home  OT Goal Formulation: With patient Time For Goal Achievement: 10/27/17 Potential to Achieve Goals: Good  Plan Discharge plan remains appropriate;Frequency remains appropriate    Co-evaluation                 AM-PAC PT "6 Clicks" Daily Activity     Outcome Measure   Help from another person eating meals?: None Help from another person taking care of personal grooming?: A Little Help from another person toileting, which includes using toliet, bedpan,  or urinal?: A Little Help from another person bathing (including washing, rinsing, drying)?: A Little Help from another person to put on and taking off regular upper body clothing?: A Little Help from another person to put on and taking off regular lower body clothing?: A Little 6 Click Score: 19    End of Session Equipment Utilized During Treatment: Oxygen  OT Visit Diagnosis: Other (comment);Muscle weakness (generalized) (M62.81)(activity tolerance)   Activity Tolerance Patient tolerated treatment well(limited by bloody nose)   Patient Left with call bell/phone within reach;with nursing/sitter in room;with family/visitor present(seated EOB)   Nurse Communication Mobility status;Other (comment)(bloody nose)        Time: 1433-1500 OT Time Calculation (min): 27 min  Charges: OT General Charges $OT Visit: 1 Visit OT Treatments $Self Care/Home Management : 8-22 mins  Delight Stare, OTR/L  Pager [place] 11/05/2017, 3:59 PM

## 2017-11-05 NOTE — Discharge Summary (Addendum)
Advanced Heart Failure Discharge Note  Discharge Summary   Patient ID: [name] MRN: [id number], DOB/AGE: 1936-03-31 82 y.o. Admit date: 10/18/2017 D/C date:     11/05/2017   Primary Discharge Diagnoses:  1. A/C diastolic HF with prominent RV failure 2. Mechanical aortic valve 3. Chronic atrial fibrillation 4. CAD s/p CABG 5. COPD 6. OSA 7. AKI on CKD stage 3 8. Anemia 9. Deconditioning 10. Thrombocytopenia 11. Severe MR 12. Gout  Hospital Course: [name] a 82 y.o.malewith h/o mechanical AVR 1994, OSA on CPAP, Chronic afib, chronic combined CHF, CKD III, chronic coumadin therapy, CAD s/p CAGB 1994, and COPD. Admitted from [facility] office 10/18/17 with Acute on chronic diastolic CHF.  He diuresed 83 lbs with lasix drip, metolazone, and diamox. Determined not to be a good candidate for MitraClip. Dr [name] also consulted and recommended aggressive medical therapy versus possible experimental percutaneous MVR as he felt very high risk for conventional surgery. Coarse complicated by drop in hemoglobin and symptomatic hypotension, requiring 1 unit pRBCs. GI consulted and did not recommend scoping. Bowel regimen increased. Problem based review of admission as below.   1. Acute on chronic diastolic CHF with prominent RV failure:  Echo this admission showed EF 55-65%, mechanical aortic valve functioning normally, mild mitral stenosis/severe mitral regurgitation, RV moderately dilated with moderately decreased systolic function, PASP 49 mmHg.  Severe MR could be contributing to the significant RV failure.  - He diuresed 83 lbs with IV lasix, metolazone, and diamox. Transitioned to torsemide 80 mg BID + KCl 40 meq BID.  - BB and spiro stopped with low blood pressures 80-90s 2. Mechanical aortic valve: Appears to function well on echo this admission.  - Remained on coumadin with heparin bridge when INR dropped below 2. He will bridge with lovenox injections at discharge with INR 1.85. He  will have close follow up in Coumadin Clinic on Wednesday. - INR goal 2.5-3.5 (also with atrial fibrillation).  3. Atrial fibrillation: Chronic. Remained rate controlled in afib. See discussion above about coumadin. BB held with low BP. 4. CAD: s/p CABG.  No s/s ischemia during admission   - Continue Vytorin.  - BB held with low blood pressures 80-90s - On coumadin, so no need for ASA 5. COPD: Remains on home oxygen 6. OSA: Remained on CPAP qHS 7. Acute on CKD: Stage 3.  Creatinine peaked at 1.68 in setting of hypotension. Torsemide held and he was given 1 unit PRBCs. Creatinine improved to 1.32 on day of discharge - Continue po torsemide.   - Stop all BP active meds to promote higher BP.  8. Anemia: Hgb had been stable in hospital around 8, but trended down to 6.3.  Guaiac negative, no overt bleeding.  He had 1 units PRBCs with appropriate rise.  GI saw, no scopes indicated.  Suspect anemia of chronic disease/renal disease.  Hemoglobin 9.3 on day of discharge.  - Continue crotonix, colace, and miralax 9. Deconditioning: He will need HH PT/OT and 24 hour supervision after discharge. He has been set up with [place] 10. Thrombocytopenia: This appears chronic, had before this admission. Stable. Platelets 122 on day of discharge. 11. 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 Structural Heart team evaluated. It does not appear that he will be a Mitraclip candidate. Hs been seen by Dr. [name] who recommended aggressive medical therapy versus possible experimental percutaneous MVR as  he felt very high risk for conventional surgery.  12. Gout: Elevated uric acid with right ankle pain. Started on colchicine with improvement in symptoms.  Lovenox prescription sent to [facility] OP pharmacy. He will take 100 mg lovenox BID until Coumadin Clinic appointment on Wednesday morning. He will have close  follow up in HF clinic, with appointment as below. HH PT and OT have been arranged with [facility]. He will have 24 hour supervision at home with his wife.  He will need a BMET and CBC at follow up.   Discharge Weight Range: 216 lbs Discharge Vitals: Blood pressure 100/70, pulse 75, temperature 97.7 F (36.5 C), temperature source Oral, resp. rate 20, height 6\' 2"  (1.88 m), weight 216 lb 4.8 oz (98.1 kg), SpO2 100 %.  Labs: Lab Results  Component Value Date   WBC 3.6 (L) 11/05/2017   HGB 9.3 (L) 11/05/2017   HCT 30.0 (L) 11/05/2017   MCV 104.5 (H) 11/05/2017   PLT 122 (L) 11/05/2017    Recent Labs  Lab 11/05/17 0433  NA 138  K 3.6  CL 92*  CO2 35*  BUN 54*  CREATININE 1.32*  CALCIUM 9.2  GLUCOSE 107*   Lab Results  Component Value Date   CHOL 104 08/24/2017   HDL 50.80 08/24/2017   LDLCALC 40 08/24/2017   TRIG 68.0 08/24/2017   BNP (last 3 results) Recent Labs    08/01/17 1804 10/18/17 1510  BNP 784.4* 1,073.3*    ProBNP (last 3 results) Recent Labs    03/30/17 1147 05/25/17 1044 07/10/17 1523  PROBNP 1,858* 2,749* 411.0*     Diagnostic Studies/Procedures   TEE 10/23/17: - Left ventricle: The cavity size was normal. Wall thickness was   increased in a pattern of mild LVH. Systolic function was normal.   The estimated ejection fraction was in the range of 55% to 60%.   Wall motion was normal; there were no regional wall motion   abnormalities. D-shaped septum consistent with RV pressure/volume   overload. - Aortic valve: Mechanical aortic valve with no significant   regurgitation, unable to obtain mean gradient as transgastric   windows were poor. - Aorta: Normal caliber ascending aorta. - Mitral valve: The mitral valve was calcified and thickened,   especially the posterior leaflet that was restricted. Mean   gradient 5 mmHg but MVA by PHT > 2 cm^2. I think there was only   minimal mitral stenosis. There was severe mitral regurgitation,   likely due to  restriction of posterior leaflet and annular   dilation from severe LAE (mixed functional and primary etiology).   ERO 0.42 cm^2 by PISA. - Left atrium: The atrium was severely dilated. No evidence of   thrombus in the atrial cavity or appendage. - Pulmonary veins: There was systolic flow reversal in the   pulmonary vein doppler pattern. - Right ventricle: The cavity size was mildly dilated. Systolic   function was mildly reduced. - Right atrium: The atrium was moderately dilated. - Atrial septum: No defect or patent foramen ovale was identified. - Tricuspid valve: Mild tricuspid regurgitation with peak RV-RA   gradient 25 mmHg.  Discharge Medications   Allergies as of 11/05/2017      Reactions   Rifampin Rash   May have been caused by Vancomycin or Rifampin (??)   Vancomycin Rash   May have been caused by Vancomycin or Rifampin (??)      Medication List    STOP taking these medications   bumetanide 2  MG tablet Commonly known as:  BUMEX   carvedilol 25 MG tablet Commonly known as:  COREG   dextromethorphan-guaiFENesin 30-600 MG 12hr tablet Commonly known as:  MUCINEX DM     TAKE these medications   acetaminophen 500 MG tablet Commonly known as:  TYLENOL Take 500-1,000 mg by mouth daily as needed (for pain).   colchicine 0.6 MG tablet Take 1 tablet (0.6 mg total) by mouth daily. Start taking on:  11/06/2017   docusate sodium 100 MG capsule Commonly known as:  COLACE Take 1 capsule (100 mg total) by mouth 2 (two) times daily.   enoxaparin 100 MG/ML injection Commonly known as:  LOVENOX Inject 1 mL (100 mg total) into the skin every 12 (twelve) hours for 3 days. Start 8/6 AM Start taking on:  11/06/2017   ezetimibe-simvastatin 10-20 MG tablet Commonly known as:  VYTORIN Take 1 tablet by mouth at bedtime.   fluocinonide cream 0.05 % Commonly known as:  LIDEX Apply 1 application topically 2 (two) times daily as needed (For legs).   folic acid 1 MG tablet Commonly  known as:  FOLVITE Take 1 tablet (1 mg total) by mouth daily.   glucose blood test strip Commonly known as:  ONE TOUCH ULTRA TEST USE AS DIRECTED TO TEST BLOOD GLUCOSE  EVERY OTHER DAY DX: 250.00   glucose blood test strip Commonly known as:  ONETOUCH VERIO Use as instructed to check blood sugar once daily. Dx Code E11.8   hydrocortisone cream 1 % Apply 1 application topically as needed for itching (for legs).   ipratropium-albuterol 0.5-2.5 (3) MG/3ML Soln Commonly known as:  DUONEB Take 3 mLs by nebulization 4 (four) times daily.   metFORMIN 750 MG 24 hr tablet Commonly known as:  GLUCOPHAGE-XR TAKE 2 TABLETS (1,500 MG TOTAL) BY MOUTH DAILY.   NON FORMULARY BiPAP: At bedtime   NON FORMULARY ONE TOUCH ULTRA TEST STRIPS  AND LANCETS   ONETOUCH DELICA LANCETS FINE Misc Use to obtain a blood specimen every day Dx code E11.8   pantoprazole 40 MG tablet Commonly known as:  PROTONIX Take 1 tablet (40 mg total) by mouth daily. Start taking on:  11/06/2017   polyethylene glycol packet Commonly known as:  MIRALAX / GLYCOLAX Take 17 g by mouth daily. Hold for diarrhea.   potassium chloride SA 20 MEQ tablet Commonly known as:  K-DUR,KLOR-CON Take 3 tablets (60 mEq total) by mouth 2 (two) times daily.   PROAIR HFA 108 (90 Base) MCG/ACT inhaler Generic drug:  albuterol INHALE 2 PUFFS INTO THE LUNGS EVERY 6 HOURS AS NEEDED (FOR CHEST CONGESTION).   senna 8.6 MG Tabs tablet Commonly known as:  SENOKOT Take 1 tablet (8.6 mg total) by mouth daily as needed for mild constipation.   torsemide 20 MG tablet Commonly known as:  DEMADEX Take 4 tablets (80 mg total) by mouth 2 (two) times daily at 10 AM and 5 PM. What changed:    medication strength  how much to take  when to take this   VASCULERA Tabs Take 1 tablet by mouth daily.   Vitamin B-12 3000 MCG Subl Take 3,000 mcg by mouth daily.   Vitamin D3 2000 units capsule Take 1 capsule (2,000 Units total) by mouth  daily.   warfarin 5 MG tablet Commonly known as:  COUMADIN Take as directed. If you are unsure how to take this medication, talk to your nurse or doctor. Original instructions:  Take 10 mg (2 tabs) on 8/5, then 7.5 mg (  1.5 tabs) until follow up in coumadin clinic, then take as directed. What changed:  additional instructions            Durable Medical Equipment  (From admission, onward)        Start     Ordered   11/05/17 1340  Heart failure home health orders  (Heart failure home health orders / Face to face)  Once    Comments:  Heart Failure Follow-up Care:  Verify follow-up appointments per Patient Discharge Instructions. Confirm transportation arranged. Reconcile home medications with discharge medication list. Remove discontinued medications from use. Assist patient/caregiver to manage medications using pill box. Reinforce low sodium food selection Assessments: Vital signs and oxygen saturation at each visit. Assess home environment for safety concerns, caregiver support and availability of low-sodium foods. Consult Education officer, museum, PT/OT, Dietitian, and CNA based on assessments. Perform comprehensive cardiopulmonary assessment. Notify MD for any change in condition or weight gain of 3 pounds in one day or 5 pounds in one week with symptoms. Daily Weights and Symptom Monitoring: Ensure patient has access to scales. Teach patient/caregiver to weigh daily before breakfast and after voiding using same scale and record.    Teach patient/caregiver to track weight and symptoms and when to notify Provider. Activity: Develop individualized activity plan with patient/caregiver.  HHRN 2 wk 2 for CP assessment   PT 2 wk 2 for CP Rehab.  Question Answer Comment  Heart Failure Follow-up Care Advanced Heart Failure (AHF) Clinic at [phone]   Obtain the following labs Other see comments   Lab frequency Other see comments   Fax lab results to AHF Clinic at [phone]   Diet Low  Sodium Heart Healthy   Fluid restrictions: 2000 mL Fluid   Skilled Nurse to notify MD of weight trends weekly for first 2 weeks. May fax or call: AHF Clinic at [phone] (fax) or [facility] Diuretic Protocol to be used by [place] only ( to be ordered by Heart Failure Team Providers Only) Yes      11/05/17 1341   10/19/17 1025  For home use only DME 3 n 1  Once     10/19/17 1024   10/19/17 1025  For home use only DME Walker  Once    Comments:  Bariatric rollater  Question:  Patient needs a walker to treat with the following condition  Answer:  CHF (congestive heart failure) ([place])   10/19/17 1024      Disposition   The patient will be discharged in stable condition to home with home health and 24 hour supervision by family. Discharge Instructions    (HEART FAILURE PATIENTS) Call MD:  Anytime you have any of the following symptoms: 1) 3 pound weight gain in 24 hours or 5 pounds in 1 week 2) shortness of breath, with or without a dry hacking cough 3) swelling in the hands, feet or stomach 4) if you have to sleep on extra pillows at night in order to breathe.   Complete by:  As directed    Avoid straining   Complete by:  As directed    Call MD for:  persistant dizziness or light-headedness   Complete by:  As directed    Diet - low sodium heart healthy   Complete by:  As directed    Heart Failure patients record your daily weight using the same scale at the same time of day   Complete by:  As directed  Increase activity slowly   Complete by:  As directed    STOP any activity that causes chest pain, shortness of breath, dizziness, sweating, or exessive weakness   Complete by:  As directed      [facility] Follow up.   Why:  [facility] and [facility] Contact information: [place] [address] [phone]        Lenn Cal, DDS. Call.   Specialty:  Dentistry Why:   Patient is to call Dental Medicine for follow-up outpatient evaluation with dental radiographs to finalize treatment plan. Contact information: [place] [address] [phone]        [facility] Follow up.   Specialty:  Home Health Services Why:  Registered Nurse and Physical Therapy Contact information: [address] [place] [address] [phone]        [facility] Follow up on 11/14/2017.   Specialty:  Cardiology Why:  Heart Failure Followup at [facility]-8:30-Park/Dropoff at ER lot (Enter under blue "Specialty Clinics" awning) or under [place] on [place] ([address] entrance, garage code: 1500, elevator 1st floor). Take am meds, bring med bottles. Contact information: [address] [id number] [place] [phone]       [place] Office Follow up on 11/07/2017.   Specialty:  Cardiology Why:  9:15 for INR check.  Contact information: [address], [place] [phone]            Duration of Discharge Encounter: Greater than 35 minutes   Signed, Georgiana Shore, NP 11/05/2017, 1:41 PM

## 2017-11-06 ENCOUNTER — Telehealth: Payer: Self-pay | Admitting: *Deleted

## 2017-11-06 NOTE — Telephone Encounter (Signed)
Pt was on TCM report admitted 10/18/17 with Acute on chronic diastolic CHF.He diuresed 83 lbs with lasix drip, metolazone, and diamox. Pt was D/C 11/05/17 and will f/u w/ cardiology on 11/14/17.Marland KitchenJohny Chess  [facility] Follow up on 11/14/2017

## 2017-11-07 ENCOUNTER — Other Ambulatory Visit (HOSPITAL_COMMUNITY): Payer: Medicare Other | Admitting: Dentistry

## 2017-11-07 ENCOUNTER — Ambulatory Visit (INDEPENDENT_AMBULATORY_CARE_PROVIDER_SITE_OTHER): Payer: Medicare Other | Admitting: *Deleted

## 2017-11-07 ENCOUNTER — Ambulatory Visit (HOSPITAL_COMMUNITY)
Admission: EM | Admit: 2017-11-07 | Discharge: 2017-11-07 | Disposition: A | Discharge status: Home / Self Care | Payer: Medicare Other | Attending: Family Medicine | Admitting: Family Medicine

## 2017-11-07 ENCOUNTER — Encounter (HOSPITAL_COMMUNITY): Payer: Self-pay | Admitting: Emergency Medicine

## 2017-11-07 DIAGNOSIS — I4891 Unspecified atrial fibrillation: Secondary | ICD-10-CM | POA: Diagnosis not present

## 2017-11-07 DIAGNOSIS — Z5181 Encounter for therapeutic drug level monitoring: Secondary | ICD-10-CM | POA: Diagnosis not present

## 2017-11-07 DIAGNOSIS — Z7189 Other specified counseling: Secondary | ICD-10-CM

## 2017-11-07 DIAGNOSIS — R040 Epistaxis: Secondary | ICD-10-CM | POA: Diagnosis not present

## 2017-11-07 DIAGNOSIS — Z952 Presence of prosthetic heart valve: Secondary | ICD-10-CM

## 2017-11-07 LAB — POCT INR: INR: 3.1 — AB (ref 2.0–3.0)

## 2017-11-07 MED ORDER — OXYMETAZOLINE HCL 0.05 % NA SOLN
NASAL | Status: AC
  Filled 2017-11-07: qty 15

## 2017-11-07 NOTE — ED Triage Notes (Signed)
Pt here for nose bleed; pt hospitalized until today for fluid overload; pt on coumadin; pt INR was 3 today; pt on home O2; pt with bleeding from left nare

## 2017-11-07 NOTE — Discharge Instructions (Addendum)
Please call your ENT doctor for prompt follow up tomorrow.

## 2017-11-07 NOTE — Patient Instructions (Signed)
Description   Today and tomorrow only take 1/2 tablet,  then start taking 1 tablet daily except 1/2 tablet on Mondays and Fridays. Stop Lovenox injections.  Repeat INR in 1 week.   Call with any new medications or procedures [phone]

## 2017-11-07 NOTE — ED Notes (Signed)
Blood running from mouth, dr hagler notified

## 2017-11-08 DIAGNOSIS — Z952 Presence of prosthetic heart valve: Secondary | ICD-10-CM | POA: Diagnosis not present

## 2017-11-08 DIAGNOSIS — Z951 Presence of aortocoronary bypass graft: Secondary | ICD-10-CM | POA: Diagnosis not present

## 2017-11-08 DIAGNOSIS — D696 Thrombocytopenia, unspecified: Secondary | ICD-10-CM | POA: Diagnosis not present

## 2017-11-08 DIAGNOSIS — Z87891 Personal history of nicotine dependence: Secondary | ICD-10-CM | POA: Diagnosis not present

## 2017-11-08 DIAGNOSIS — J449 Chronic obstructive pulmonary disease, unspecified: Secondary | ICD-10-CM | POA: Diagnosis not present

## 2017-11-08 DIAGNOSIS — Z5181 Encounter for therapeutic drug level monitoring: Secondary | ICD-10-CM | POA: Diagnosis not present

## 2017-11-08 DIAGNOSIS — D631 Anemia in chronic kidney disease: Secondary | ICD-10-CM | POA: Diagnosis not present

## 2017-11-08 DIAGNOSIS — G4733 Obstructive sleep apnea (adult) (pediatric): Secondary | ICD-10-CM | POA: Diagnosis not present

## 2017-11-08 DIAGNOSIS — Z7984 Long term (current) use of oral hypoglycemic drugs: Secondary | ICD-10-CM | POA: Diagnosis not present

## 2017-11-08 DIAGNOSIS — N183 Chronic kidney disease, stage 3 (moderate): Secondary | ICD-10-CM | POA: Diagnosis not present

## 2017-11-08 DIAGNOSIS — E1122 Type 2 diabetes mellitus with diabetic chronic kidney disease: Secondary | ICD-10-CM | POA: Diagnosis not present

## 2017-11-08 DIAGNOSIS — I482 Chronic atrial fibrillation: Secondary | ICD-10-CM | POA: Diagnosis not present

## 2017-11-08 DIAGNOSIS — I2510 Atherosclerotic heart disease of native coronary artery without angina pectoris: Secondary | ICD-10-CM | POA: Diagnosis not present

## 2017-11-08 DIAGNOSIS — I130 Hypertensive heart and chronic kidney disease with heart failure and stage 1 through stage 4 chronic kidney disease, or unspecified chronic kidney disease: Secondary | ICD-10-CM | POA: Diagnosis not present

## 2017-11-08 DIAGNOSIS — I5033 Acute on chronic diastolic (congestive) heart failure: Secondary | ICD-10-CM | POA: Diagnosis not present

## 2017-11-08 DIAGNOSIS — Z9981 Dependence on supplemental oxygen: Secondary | ICD-10-CM | POA: Diagnosis not present

## 2017-11-08 DIAGNOSIS — M109 Gout, unspecified: Secondary | ICD-10-CM | POA: Diagnosis not present

## 2017-11-08 DIAGNOSIS — Z7901 Long term (current) use of anticoagulants: Secondary | ICD-10-CM | POA: Diagnosis not present

## 2017-11-08 NOTE — ED Provider Notes (Signed)
[facility] [place]   [id number] 11/07/17 Arrival Time: [place]:  1. Left-sided epistaxis    Afrin without relief. Rhino rocket inserted without complication. Observed for 30 minutes. No further bleeding seen or reported. D/C home to f/u with ENT in the morning. To ED if abrupt return or worsening overnight.  Reviewed expectations re: course of current medical issues. Questions answered. Outlined signs and symptoms indicating need for more acute intervention. Patient verbalized understanding. After Visit Summary given.   SUBJECTIVE: History from: patient.  [name] is a 82 y.o. male who presents with complaint of a persistent nosebleed. On Coumadin with last INR of 3 per his report. For several hours reports L sided nosebleed. Unable to control. Called his ENT who referred him here for evaluation. No gum bleeding. No n/v. Recently hospitalized. Normally uses home O2 via [place].  Social History   Tobacco Use  Smoking Status Former Smoker  . Packs/day: 1.00  . Years: 30.00  . Pack years: 30.00  . Types: Cigarettes  . Last attempt to quit: 04/03/1992  . Years since quitting: 25.6  Smokeless Tobacco Never Used    ROS: As per HPI.   OBJECTIVE:  Vitals:   11/07/17 1921  BP: 115/64  Pulse: 94  Resp: 18  Temp: (!) 97.4 F (36.3 C)  TempSrc: Oral  SpO2: 91%     General appearance: alert; appears fatigued HEENT: active L-sided epistaxis; gums normal CV: RRR Neck: supple without LAD Lungs: unlabored respirations, symmetrical air entry; no respiratory distress Skin: warm and dry; no rashes or petechiae Psychological: alert and cooperative; normal mood and affect  Allergies  Allergen Reactions  . Rifampin Rash    May have been caused by Vancomycin or Rifampin (??)  . Vancomycin Rash    May have been caused by Vancomycin or Rifampin (??)    Past Medical History:  Diagnosis Date  . Carotid artery disease ([place]) 1994   s/p left carotid  endarerectomy   . Chronic atrial fibrillation (HCC)    a. on coumadin   . Chronic diastolic CHF (congestive heart failure) ([place])   . Hx of CABG    a. 1994  . Hypertension   . OSA (obstructive sleep apnea)   . Rheumatic fever   . S/P AVR (aortic valve replacement)    a. mechanical valve 1996  . Subclavian bypass stenosis ([place])   . Vitamin B12 deficiency    Family History  Problem Relation Age of Onset  . Cancer Father        lung   . Hypertension Mother    Social History   Socioeconomic History  . Marital status: Married    Spouse name: Not on file  . Number of children: 3  . Years of education: Not on file  . Highest education level: Not on file  Occupational History  . Not on file  Social Needs  . Financial resource strain: Not on file  . Food insecurity:    Worry: Not on file    Inability: Not on file  . Transportation needs:    Medical: Not on file    Non-medical: Not on file  Tobacco Use  . Smoking status: Former Smoker    Packs/day: 1.00    Years: 30.00    Pack years: 30.00    Types: Cigarettes    Last attempt to quit: 04/03/1992    Years since quitting: 25.6  . Smokeless tobacco: Never Used  Substance and Sexual Activity  .  Alcohol use: Yes    Comment: 4-6 ounces of wine daily  . Drug use: No    Comment: Half a cup a day.  Marland Kitchen Sexual activity: Not on file  Lifestyle  . Physical activity:    Days per week: Not on file    Minutes per session: Not on file  . Stress: Not on file  Relationships  . Social connections:    Talks on phone: Not on file    Gets together: Not on file    Attends religious service: Not on file    Active member of club or organization: Not on file    Attends meetings of clubs or organizations: Not on file    Relationship status: Not on file  . Intimate partner violence:    Fear of current or ex partner: Not on file    Emotionally abused: Not on file    Physically abused: Not on file    Forced sexual activity: Not on file    Other Topics Concern  . Not on file  Social History Narrative   Patient is married and lives with his wife.   Youngest daughter lives in [place] also.   Patient has 2 other children.            Vanessa Kick, MD 11/08/17 1058

## 2017-11-12 DIAGNOSIS — Z7901 Long term (current) use of anticoagulants: Secondary | ICD-10-CM | POA: Diagnosis not present

## 2017-11-12 DIAGNOSIS — Z5181 Encounter for therapeutic drug level monitoring: Secondary | ICD-10-CM | POA: Diagnosis not present

## 2017-11-12 DIAGNOSIS — R040 Epistaxis: Secondary | ICD-10-CM | POA: Diagnosis not present

## 2017-11-13 ENCOUNTER — Other Ambulatory Visit: Payer: Self-pay

## 2017-11-13 DIAGNOSIS — N183 Chronic kidney disease, stage 3 (moderate): Secondary | ICD-10-CM | POA: Diagnosis not present

## 2017-11-13 DIAGNOSIS — I2510 Atherosclerotic heart disease of native coronary artery without angina pectoris: Secondary | ICD-10-CM | POA: Diagnosis not present

## 2017-11-13 DIAGNOSIS — I5033 Acute on chronic diastolic (congestive) heart failure: Secondary | ICD-10-CM | POA: Diagnosis not present

## 2017-11-13 DIAGNOSIS — D631 Anemia in chronic kidney disease: Secondary | ICD-10-CM | POA: Diagnosis not present

## 2017-11-13 DIAGNOSIS — E1122 Type 2 diabetes mellitus with diabetic chronic kidney disease: Secondary | ICD-10-CM | POA: Diagnosis not present

## 2017-11-13 DIAGNOSIS — I130 Hypertensive heart and chronic kidney disease with heart failure and stage 1 through stage 4 chronic kidney disease, or unspecified chronic kidney disease: Secondary | ICD-10-CM | POA: Diagnosis not present

## 2017-11-13 LAB — POCT INR: INR: 1.9 — AB (ref 2.0–3.0)

## 2017-11-13 NOTE — Patient Outreach (Signed)
[facility] [place]) Care Management  11/13/2017  [name] [birth year] [id number]     EMMI-HF RED ON EMMI ALERT Day # 5 Date: 11/12/17 Red Alert Reason: " Weight? 219 lbs"   Outreach attempt # 1 to patient. Spoke with patient and spouse.Reviewed and addressed red alert with patient. Patient voices that his spouse entered the wrong numbers. He voices that his baseline weight is 221 lbs in which he weighed that this morning. He denies any issues with edema and/or SOB. Patient is aware to notify MD of any worsening and/or changes in condition as well as weight. He has MD follow up appt on tomorrow. He voices no issues with transportation. Patient states that he has all his meds. He plans to tale with MD tomorrow regarding the several stool softeners/laxatives that he is taking. He was having issues with constipation while in the hospital but voices issue has since resolved and does not feel like he needs to take all of those meds. He denies any further RN CM needs or concerns at this time. Advised patient that they would continue to get automated EMMI- HF post discharge calls to assess how they are doing following recent hospitalization and will receive a call from a nurse if any of their responses were abnormal. Patient voiced understanding and was appreciative of f/u call.     Plan: RN CM will close case as no further interventions needed at this time.    Enzo Montgomery, RN,BSN,CCM [facility] Management Telephonic Care Management Coordinator Direct Phone: [phone] Toll Free: [phone] Fax: [phone]

## 2017-11-13 NOTE — Progress Notes (Signed)
Advanced Heart Failure Clinic Note   PCP: [name], [name], MD PCP-Cardiologist: [name], MD  HF: [name]  HPI: [name] a 82 y.o.malewith h/o mechanical AVR 1994, OSA on CPAP, Chronic afib, chronic combined CHF, CKD III, chronic coumadin therapy, CAD s/p CAGB 1994, and COPD.   Admitted 7/18-11/05/17 from [facility] office with A/C diastolic HF. Diuresed 83 lbs with lasix drip, metolazone, and diamox. Transitioned to torsemide 80 mg BID. Determined not to be a candidate for MitraClip. Dr [name] also consulted and recommended aggressive medical therapy versus possible experimental percutaneous MVR as he felt very high risk for conventional surgery. Coarse complicated by drop in hemoglobin and symptomatic hypotension, requiring 1 unit pRBCs. GI consulted and did not recommend scoping. BB and spiro were stopped due to low BPs. Had some AKI assoicated with low BP, but resolved by discharge. Required lovenox bridge at DC with subtherapeutic INR. Discharged with [facility]. DC weight: 216 lbs.   He presents today for post hospital follow up with his wife. Overall doing well. Denies SOB. Able to do ADLs and walk around the house with a walker. [facility] now following him and will start PT this week. He is using his walker in the house and is still unsteady on his feet. Denies orthopnea or PND. BLE edema is coming back. Weights trending up 216 > 222 lbs at home. Appetite and energy level are good. He had a nose bleed that required packing at Urgent Care, now resolved. He is going to have humidifier added to his oxygen. No other bleeding on coumadin. Denies dizziness or CP. Back on BiPAP now that nasal packing has been removed. Planning to get a new fitted mask soon. He is drinking about 2 L of fluid. Limits salt intake. Taking all medications.   Review of systems complete and found to be negative unless listed in HPI.   Past Medical History:  Diagnosis Date  . Carotid artery disease ([place]) 1994   s/p left carotid endarerectomy   . Chronic atrial fibrillation (HCC)    a. on coumadin   . Chronic diastolic CHF (congestive heart failure) ([place])   . Hx of CABG    a. 1994  . Hypertension   . OSA (obstructive sleep apnea)   . Rheumatic fever   . S/P AVR (aortic valve replacement)    a. mechanical valve 1996  . Subclavian bypass stenosis ([place])   . Vitamin B12 deficiency     Current Outpatient Medications  Medication Sig Dispense Refill  . acetaminophen (TYLENOL) 500 MG tablet Take 500-1,000 mg by mouth daily as needed (for pain).     . colchicine 0.6 MG tablet Take 1 tablet (0.6 mg total) by mouth daily. 30 tablet 0  . docusate sodium (COLACE) 100 MG capsule Take 1 capsule (100 mg total) by mouth 2 (two) times daily. 10 capsule 0  . ezetimibe-simvastatin (VYTORIN) 10-20 MG tablet Take 1 tablet by mouth at bedtime. 90 tablet 3  . fluocinonide cream (LIDEX) 7.06 % Apply 1 application topically 2 (two) times daily as needed (For legs).    . folic acid (FOLVITE) 1 MG tablet Take 1 tablet (1 mg total) by mouth daily.    Marland Kitchen glucose blood (ONE TOUCH ULTRA TEST) test strip USE AS DIRECTED TO TEST BLOOD GLUCOSE  EVERY OTHER DAY DX: 250.00 50 each 2  . glucose blood (ONETOUCH VERIO) test strip Use as instructed to check blood sugar once daily. Dx Code E11.8 100 each 3  .  hydrocortisone cream 1 % Apply 1 application topically as needed for itching (for legs).    Marland Kitchen ipratropium-albuterol (DUONEB) 0.5-2.5 (3) MG/3ML SOLN Take 3 mLs by nebulization 4 (four) times daily. 360 mL 3  . metFORMIN (GLUCOPHAGE-XR) 750 MG 24 hr tablet TAKE 2 TABLETS (1,500 MG TOTAL) BY MOUTH DAILY. 180 tablet 2  . NON FORMULARY BiPAP: At bedtime    . pantoprazole (PROTONIX) 40 MG tablet Take 1 tablet (40 mg total) by mouth daily. 14 tablet 0  . polyethylene glycol (MIRALAX / GLYCOLAX) packet Take 17 g by mouth daily. Hold for diarrhea. 14 each 0  . potassium chloride SA (K-DUR,KLOR-CON) 20 MEQ tablet Take 3 tablets (60 mEq  total) by mouth 2 (two) times daily. 180 tablet 6  . senna (SENOKOT) 8.6 MG TABS tablet Take 1 tablet (8.6 mg total) by mouth daily as needed for mild constipation. 120 each 0  . torsemide (DEMADEX) 20 MG tablet Take 4 tablets (80 mg total) by mouth 2 (two) times daily at 10 AM and 5 PM. 240 tablet 6  . warfarin (COUMADIN) 5 MG tablet Take 10 mg (2 tabs) on 8/5, then 7.5 mg (1.5 tabs) until follow up in coumadin clinic, then take as directed. 45 tablet 6  . Cholecalciferol (VITAMIN D3) 2000 units capsule Take 1 capsule (2,000 Units total) by mouth daily. 100 capsule 3  . Cyanocobalamin (VITAMIN B-12) 3000 MCG SUBL Take 3,000 mcg by mouth daily.     . Dietary Management Product (VASCULERA) TABS Take 1 tablet by mouth daily. 90 tablet 3  . NON FORMULARY ONE TOUCH ULTRA TEST STRIPS  AND LANCETS    . ONETOUCH DELICA LANCETS FINE MISC Use to obtain a blood specimen every day Dx code E11.8 100 each 1  . PROAIR HFA 108 (90 Base) MCG/ACT inhaler INHALE 2 PUFFS INTO THE LUNGS EVERY 6 HOURS AS NEEDED (FOR CHEST CONGESTION). 1 Inhaler 11   No current facility-administered medications for this encounter.     Allergies  Allergen Reactions  . Rifampin Rash    May have been caused by Vancomycin or Rifampin (??)  . Vancomycin Rash    May have been caused by Vancomycin or Rifampin (??)      Social History   Socioeconomic History  . Marital status: Married    Spouse name: Not on file  . Number of children: 3  . Years of education: Not on file  . Highest education level: Not on file  Occupational History  . Not on file  Social Needs  . Financial resource strain: Not on file  . Food insecurity:    Worry: Not on file    Inability: Not on file  . Transportation needs:    Medical: Not on file    Non-medical: Not on file  Tobacco Use  . Smoking status: Former Smoker    Packs/day: 1.00    Years: 30.00    Pack years: 30.00    Types: Cigarettes    Last attempt to quit: 04/03/1992    Years since  quitting: 25.6  . Smokeless tobacco: Never Used  Substance and Sexual Activity  . Alcohol use: Yes    Comment: 4-6 ounces of wine daily  . Drug use: No    Comment: Half a cup a day.  Marland Kitchen Sexual activity: Not on file  Lifestyle  . Physical activity:    Days per week: Not on file    Minutes per session: Not on file  . Stress: Not on  file  Relationships  . Social connections:    Talks on phone: Not on file    Gets together: Not on file    Attends religious service: Not on file    Active member of club or organization: Not on file    Attends meetings of clubs or organizations: Not on file    Relationship status: Not on file  . Intimate partner violence:    Fear of current or ex partner: Not on file    Emotionally abused: Not on file    Physically abused: Not on file    Forced sexual activity: Not on file  Other Topics Concern  . Not on file  Social History Narrative   Patient is married and lives with his wife.   Youngest daughter lives in [place] also.   Patient has 2 other children.      Family History  Problem Relation Age of Onset  . Cancer Father        lung   . Hypertension Mother     Vitals:   11/14/17 0846  BP: (!) 120/58  Pulse: 67  SpO2: 95%  Weight: 102.1 kg (225 lb)   Wt Readings from Last 3 Encounters:  11/14/17 102.1 kg (225 lb)  11/05/17 98.1 kg (216 lb 4.8 oz)  10/18/17 135.9 kg (299 lb 9.6 oz)    PHYSICAL EXAM: General:  Elderl. Arrived in wheelchair. No respiratory difficulty HEENT: normal Neck: supple. JVP ~10. Carotids 2+ bilat; no bruits. No lymphadenopathy or thyromegaly appreciated. Cor: PMI nondisplaced. Irregular rate & rhythm. No rubs, gallops or murmurs. Lungs: clear On O2.  Abdomen: soft, nontender, nondistended. No hepatosplenomegaly. No bruits or masses. Good bowel sounds. Extremities: no cyanosis, clubbing, rash, 2+ edema Neuro: alert & oriented x 3, cranial nerves grossly intact. moves all 4 extremities w/o difficulty. Affect  pleasant.   ASSESSMENT & PLAN:  1. Acute on chronic diastolic CHF with prominent RV failure:  Echo 10/2017: EF 55-65%, mechanical aortic valve functioning normally, mild mitral stenosis/severe mitral regurgitation, RV moderately dilated with moderately decreased systolic function, PASP 49 mmHg.  Severe MR could be contributing to the significant RV failure.   - NYHA class III, not very active.  - Volume status elevated on exam. - Increase torsemide to 100 mg am, 80 mg pm. + KCl 60 meq BID - Restart spiro 12.5 mg qHS. BMET today and in 7-10 days. SBP 120s at home now.  - Add unna boots for home through [facility]. - ReDS protocol in place. They are going to use vest next week.  - Discussed limiting fluid and salt intake. They will call us if weight does not start to trend down.  2. Mechanical aortic valve: Appears to function well on echo last admission.  - Continue warfarin with INR goal 2.5-3.5 (also with atrial fibrillation).  - Required lovenox at DC. INR therapeutic 8/7. Had a nose bleed that required packing, but no further bleeding.  3. Atrial fibrillation: Chronic.  - Rate controlled. Off coreg with low BPs. HR 60s. Will hold off for now.  4. CAD: s/p CABG.  - No s/s ischemia   - Continue Vytorin.  5. COPD:  - Continue home oxygen.  Adding humidifier to O2 6. OSA:  - Continue BiPAP qHS 7. CKD: Stage 3.   - Recent AKI. Check BMET today.  8. Anemia:  - Required blood transfusion while inpatient. GI saw. Guaiac negative. Scope not indicated. Check CBC today.  - Continue Protonix. Denies blood in urine  or stool. Nose bleed resolved.  9. Thrombocytopenia - Chronic.  10. 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  - Structural Heart team evaluated. Not a Mitraclip candidate. - Has been seen by Dr. [name] who recommended aggressive medical therapy versus possible experimental  percutaneous MVR as he felt very high risk for conventional surgery.  11. Gout:  - Elevated uric acid while inpatient.  - Continue colchicine. Consider allopurinol after current flare is improved.  - Symptoms resolved.  BMET, CBC today Increase torsemide to 100 mg am, 80 mg pm Start spiro 12.5 mg qHS BMET in 7-10 days [facility] to draw) Have [facility] add unna boots Follow up in HF clinic 2-3 weeks He and wife would like to keep appointment with Dr Tamala Julian this week  Georgiana Shore, NP 11/14/17  Greater than 50% of the 25 minute visit was spent in counseling/coordination of care regarding disease state education, salt/fluid restriction, sliding scale diuretics, and medication compliance.

## 2017-11-14 ENCOUNTER — Ambulatory Visit (HOSPITAL_COMMUNITY): Payer: Self-pay | Admitting: Dentistry

## 2017-11-14 ENCOUNTER — Encounter (HOSPITAL_COMMUNITY): Payer: Self-pay | Admitting: Dentistry

## 2017-11-14 ENCOUNTER — Encounter (HOSPITAL_COMMUNITY): Payer: Self-pay

## 2017-11-14 ENCOUNTER — Ambulatory Visit (HOSPITAL_COMMUNITY)
Admission: RE | Admit: 2017-11-14 | Discharge: 2017-11-14 | Disposition: A | Discharge status: Home / Self Care | Payer: Medicare Other | Source: Ambulatory Visit | Attending: Cardiology | Admitting: Cardiology

## 2017-11-14 ENCOUNTER — Telehealth: Payer: Self-pay | Admitting: Internal Medicine

## 2017-11-14 VITALS — BP 120/58 | HR 67 | Wt 225.0 lb

## 2017-11-14 VITALS — BP 114/71 | HR 76 | Temp 97.8°F

## 2017-11-14 DIAGNOSIS — Z9981 Dependence on supplemental oxygen: Secondary | ICD-10-CM | POA: Diagnosis not present

## 2017-11-14 DIAGNOSIS — I482 Chronic atrial fibrillation: Secondary | ICD-10-CM | POA: Diagnosis not present

## 2017-11-14 DIAGNOSIS — Z951 Presence of aortocoronary bypass graft: Secondary | ICD-10-CM | POA: Insufficient documentation

## 2017-11-14 DIAGNOSIS — I5033 Acute on chronic diastolic (congestive) heart failure: Secondary | ICD-10-CM | POA: Insufficient documentation

## 2017-11-14 DIAGNOSIS — Z87891 Personal history of nicotine dependence: Secondary | ICD-10-CM | POA: Insufficient documentation

## 2017-11-14 DIAGNOSIS — Z7901 Long term (current) use of anticoagulants: Secondary | ICD-10-CM

## 2017-11-14 DIAGNOSIS — D631 Anemia in chronic kidney disease: Secondary | ICD-10-CM | POA: Insufficient documentation

## 2017-11-14 DIAGNOSIS — D649 Anemia, unspecified: Secondary | ICD-10-CM | POA: Diagnosis not present

## 2017-11-14 DIAGNOSIS — J449 Chronic obstructive pulmonary disease, unspecified: Secondary | ICD-10-CM | POA: Diagnosis not present

## 2017-11-14 DIAGNOSIS — I2510 Atherosclerotic heart disease of native coronary artery without angina pectoris: Secondary | ICD-10-CM | POA: Diagnosis not present

## 2017-11-14 DIAGNOSIS — K0530 Chronic periodontitis, unspecified: Secondary | ICD-10-CM

## 2017-11-14 DIAGNOSIS — I5032 Chronic diastolic (congestive) heart failure: Secondary | ICD-10-CM

## 2017-11-14 DIAGNOSIS — K06020 Generalized gingival recession, unspecified: Secondary | ICD-10-CM

## 2017-11-14 DIAGNOSIS — M270 Developmental disorders of jaws: Secondary | ICD-10-CM

## 2017-11-14 DIAGNOSIS — Z79899 Other long term (current) drug therapy: Secondary | ICD-10-CM | POA: Diagnosis not present

## 2017-11-14 DIAGNOSIS — E669 Obesity, unspecified: Secondary | ICD-10-CM | POA: Diagnosis not present

## 2017-11-14 DIAGNOSIS — K08409 Partial loss of teeth, unspecified cause, unspecified class: Secondary | ICD-10-CM

## 2017-11-14 DIAGNOSIS — I340 Nonrheumatic mitral (valve) insufficiency: Secondary | ICD-10-CM | POA: Insufficient documentation

## 2017-11-14 DIAGNOSIS — Z9189 Other specified personal risk factors, not elsewhere classified: Secondary | ICD-10-CM

## 2017-11-14 DIAGNOSIS — I4891 Unspecified atrial fibrillation: Secondary | ICD-10-CM

## 2017-11-14 DIAGNOSIS — M264 Malocclusion, unspecified: Secondary | ICD-10-CM

## 2017-11-14 DIAGNOSIS — Z8249 Family history of ischemic heart disease and other diseases of the circulatory system: Secondary | ICD-10-CM | POA: Insufficient documentation

## 2017-11-14 DIAGNOSIS — D696 Thrombocytopenia, unspecified: Secondary | ICD-10-CM | POA: Insufficient documentation

## 2017-11-14 DIAGNOSIS — M109 Gout, unspecified: Secondary | ICD-10-CM | POA: Diagnosis not present

## 2017-11-14 DIAGNOSIS — I130 Hypertensive heart and chronic kidney disease with heart failure and stage 1 through stage 4 chronic kidney disease, or unspecified chronic kidney disease: Secondary | ICD-10-CM | POA: Insufficient documentation

## 2017-11-14 DIAGNOSIS — K036 Deposits [accretions] on teeth: Secondary | ICD-10-CM

## 2017-11-14 DIAGNOSIS — Z881 Allergy status to other antibiotic agents status: Secondary | ICD-10-CM | POA: Diagnosis not present

## 2017-11-14 DIAGNOSIS — Z952 Presence of prosthetic heart valve: Secondary | ICD-10-CM

## 2017-11-14 DIAGNOSIS — K045 Chronic apical periodontitis: Secondary | ICD-10-CM

## 2017-11-14 DIAGNOSIS — K0889 Other specified disorders of teeth and supporting structures: Secondary | ICD-10-CM

## 2017-11-14 DIAGNOSIS — Z7984 Long term (current) use of oral hypoglycemic drugs: Secondary | ICD-10-CM | POA: Diagnosis not present

## 2017-11-14 DIAGNOSIS — G4733 Obstructive sleep apnea (adult) (pediatric): Secondary | ICD-10-CM | POA: Insufficient documentation

## 2017-11-14 DIAGNOSIS — N183 Chronic kidney disease, stage 3 (moderate): Secondary | ICD-10-CM | POA: Insufficient documentation

## 2017-11-14 DIAGNOSIS — K029 Dental caries, unspecified: Secondary | ICD-10-CM

## 2017-11-14 LAB — CBC
HEMATOCRIT: 25.3 % — AB (ref 39.0–52.0)
HEMOGLOBIN: 7.9 g/dL — AB (ref 13.0–17.0)
MCH: 32.9 pg (ref 26.0–34.0)
MCHC: 31.2 g/dL (ref 30.0–36.0)
MCV: 105.4 fL — ABNORMAL HIGH (ref 78.0–100.0)
Platelets: 132 10*3/uL — ABNORMAL LOW (ref 150–400)
RBC: 2.4 MIL/uL — AB (ref 4.22–5.81)
RDW: 18.6 % — ABNORMAL HIGH (ref 11.5–15.5)
WBC: 3.9 10*3/uL — AB (ref 4.0–10.5)

## 2017-11-14 LAB — BASIC METABOLIC PANEL
ANION GAP: 10 (ref 5–15)
BUN: 37 mg/dL — ABNORMAL HIGH (ref 8–23)
CO2: 26 mmol/L (ref 22–32)
Calcium: 9.1 mg/dL (ref 8.9–10.3)
Chloride: 96 mmol/L — ABNORMAL LOW (ref 98–111)
Creatinine, Ser: 1.26 mg/dL — ABNORMAL HIGH (ref 0.61–1.24)
GFR calc Af Amer: >60 mL/min (ref >60)
GFR, EST NON AFRICAN AMERICAN: 52 mL/min — AB (ref >60)
Glucose, Bld: 119 mg/dL — ABNORMAL HIGH (ref 70–99)
POTASSIUM: 4.8 mmol/L (ref 3.5–5.1)
SODIUM: 132 mmol/L — AB (ref 135–145)

## 2017-11-14 MED ORDER — SPIRONOLACTONE 25 MG PO TABS: 12.5000 mg | ORAL_TABLET | Freq: Every day | ORAL | 3 refills | Status: DC

## 2017-11-14 MED ORDER — TORSEMIDE 20 MG PO TABS: ORAL_TABLET | ORAL | 11 refills | Status: DC

## 2017-11-14 NOTE — Progress Notes (Signed)
11/14/2017  Patient:            [name] Date of Birth:  February 24, 1936 MRN:                [id number]   BP 114/71 (BP Location: Right Arm)   Pulse 76   Temp 97.8 F (36.6 C)    [name] is an 82 year old male recently diagnosed with severe mitral regurgitation. Patient is status post aortic valve replacement with chronic anticoagulation. Patient was seen as part of a pre-heart valve surgery dental protocol examination on 10/29/2017 during his hospitalization.  Patient was recently discharged and is now seen for a full series of dental radiographs, dental examination, and discussion of dental treatment needs.  Patient Active Problem List   Diagnosis Date Noted  . Severe mitral regurgitation 11/14/2017  . Pressure injury of skin 10/29/2017  . Shortness of breath   . Palliative care by specialist   . Acute on chronic diastolic CHF (congestive heart failure) ([place])   . RVF (right ventricular failure) ([place])   . Right heart failure ([place]) 10/18/2017  . Acute on chronic combined systolic and diastolic ACC/AHA stage C congestive heart failure ([place]) 10/18/2017  . Hypoxia   . COPD exacerbation ([place]) 08/01/2017  . Pancytopenia ([place]) 08/01/2017  . Epistaxis 07/11/2017  . Actinic keratoses 04/05/2017  . Leg wound, right 02/18/2016  . Edema 02/18/2016  . Chronic anticoagulation 02/18/2016  . Hyperkalemia 07/25/2015  . Atherosclerosis of native arteries of extremity with intermittent claudication ([place]) 10/21/2014  . Thrombocytopenia ([place]) 10/20/2014  . Macrocytosis without anemia 10/20/2014  . OSA (obstructive sleep apnea) 05/15/2013  . Goals of care, counseling/discussion 05/01/2013  . History of mechanical aortic valve replacement 01/02/2013  . Obesity (BMI 30-39.9) 10/20/2012  . Type II diabetes mellitus with complication ([place]) 70/17/7939  . Acute asthmatic bronchitis 01/18/2012  . ACUTE KIDNEY FAILURE UNSPECIFIED 02/04/2009  . CUTANEOUS ERUPTIONS, DRUG-INDUCED 02/04/2009  . HLD  (hyperlipidemia) 02/03/2009  . Essential hypertension 02/03/2009  . Coronary atherosclerosis 02/03/2009  . Atrial fibrillation ([place]) 02/03/2009  . Chronic diastolic heart failure ([place]) 02/03/2009  . CAROTID ENDARTERECTOMY, LEFT, HX OF 02/03/2009  . INF&INFLAM REACT DUE CARD DEVICE IMPLANT&GRAFT 12/30/2008   Past Medical History:  Diagnosis Date  . Carotid artery disease ([place]) 1994   s/p left carotid endarerectomy   . Chronic atrial fibrillation ([facility])    a. on coumadin   . Chronic diastolic CHF (congestive heart failure) ([place])   . Hx of CABG    a. 1994  . Hypertension   . OSA (obstructive sleep apnea)   . Rheumatic fever   . S/P AVR (aortic valve replacement)    a. mechanical valve 1996  . Subclavian bypass stenosis ([place])   . Vitamin B12 deficiency    Past Surgical History:  Procedure Laterality Date  . [place]   replaced due to aortic stenosis, [employer] mechanical prostesis  . CAROTID ENDARTERECTOMY Left 1997   subclavian bypass Done in [place]  . CORONARY ARTERY BYPASS GRAFT  1994   w SVG to RCA and SVG to circumflex  . heart bypass     Done in [place]  . TEE WITHOUT CARDIOVERSION N/A 10/23/2017   Procedure: TRANSESOPHAGEAL ECHOCARDIOGRAM (TEE);  Surgeon: Larey Dresser, MD;  Location: [facility] ENDOSCOPY;  Service: Cardiovascular;  Laterality: N/A;    Allergies  Allergen Reactions  . Rifampin Rash    May have been caused by Vancomycin or Rifampin (??)  . Vancomycin Rash  May have been caused by Vancomycin or Rifampin (??)    Current Outpatient Medications  Medication Sig Dispense Refill  . acetaminophen (TYLENOL) 500 MG tablet Take 500-1,000 mg by mouth daily as needed (for pain).     . Cholecalciferol (VITAMIN D3) 2000 units capsule Take 1 capsule (2,000 Units total) by mouth daily. 100 capsule 3  . colchicine 0.6 MG tablet Take 1 tablet (0.6 mg total) by mouth daily. 30 tablet 0  . Cyanocobalamin (VITAMIN B-12) 3000 MCG SUBL Take 3,000 mcg by  mouth daily.     . Dietary Management Product (VASCULERA) TABS Take 1 tablet by mouth daily. 90 tablet 3  . docusate sodium (COLACE) 100 MG capsule Take 1 capsule (100 mg total) by mouth 2 (two) times daily. 10 capsule 0  . ezetimibe-simvastatin (VYTORIN) 10-20 MG tablet Take 1 tablet by mouth at bedtime. 90 tablet 3  . fluocinonide cream (LIDEX) 4.09 % Apply 1 application topically 2 (two) times daily as needed (For legs).    . folic acid (FOLVITE) 1 MG tablet Take 1 tablet (1 mg total) by mouth daily.    Marland Kitchen glucose blood (ONE TOUCH ULTRA TEST) test strip USE AS DIRECTED TO TEST BLOOD GLUCOSE  EVERY OTHER DAY DX: 250.00 50 each 2  . glucose blood (ONETOUCH VERIO) test strip Use as instructed to check blood sugar once daily. Dx Code E11.8 100 each 3  . hydrocortisone cream 1 % Apply 1 application topically as needed for itching (for legs).    Marland Kitchen ipratropium-albuterol (DUONEB) 0.5-2.5 (3) MG/3ML SOLN Take 3 mLs by nebulization 4 (four) times daily. 360 mL 3  . metFORMIN (GLUCOPHAGE-XR) 750 MG 24 hr tablet TAKE 2 TABLETS (1,500 MG TOTAL) BY MOUTH DAILY. 180 tablet 2  . NON FORMULARY BiPAP: At bedtime    . NON FORMULARY ONE TOUCH ULTRA TEST STRIPS  AND LANCETS    . ONETOUCH DELICA LANCETS FINE MISC Use to obtain a blood specimen every day Dx code E11.8 100 each 1  . pantoprazole (PROTONIX) 40 MG tablet Take 1 tablet (40 mg total) by mouth daily. 14 tablet 0  . polyethylene glycol (MIRALAX / GLYCOLAX) packet Take 17 g by mouth daily. Hold for diarrhea. 14 each 0  . potassium chloride SA (K-DUR,KLOR-CON) 20 MEQ tablet Take 3 tablets (60 mEq total) by mouth 2 (two) times daily. 180 tablet 6  . PROAIR HFA 108 (90 Base) MCG/ACT inhaler INHALE 2 PUFFS INTO THE LUNGS EVERY 6 HOURS AS NEEDED (FOR CHEST CONGESTION). 1 Inhaler 11  . senna (SENOKOT) 8.6 MG TABS tablet Take 1 tablet (8.6 mg total) by mouth daily as needed for mild constipation. 120 each 0  . spironolactone (ALDACTONE) 25 MG tablet Take 0.5 tablets  (12.5 mg total) by mouth daily. 45 tablet 3  . torsemide (DEMADEX) 20 MG tablet Take 100 mg (5 tabs) in am and 80 mg (4 tabs) in pm 270 tablet 11  . warfarin (COUMADIN) 5 MG tablet Take 10 mg (2 tabs) on 8/5, then 7.5 mg (1.5 tabs) until follow up in coumadin clinic, then take as directed. 45 tablet 6   No current facility-administered medications for this visit.     DENTAL EXAMINATION: GENERAL: The patient is a well-developed, well-nourished male in no acute distress. HEAD AND NECK: There is no palpable neck lymphadenopathy.  The patient denies acute TMJ symptoms. INTRAORAL EXAM: Patient has normal saliva.  The patient has bilateral mandibular lingual tori. There is no evidence of oral abscess formation. DENTITION:  The patient is missing tooth #16. PERIODONTAL: The patient has chronic periodontitis with plaque and calculus accumulations, generalized gingival recession, and tooth mobility.  There is incipient to moderate bone loss noted. DENTAL CARIES/SUBOPTIMAL RESTORATIONS: Multiple dental caries are noted. Multiple suboptimal dental restorations are noted. ENDODONTIC: The patient currently denies acute pulpitis symptoms.  The patient has periapical pathology and radiolucency involving tooth #30. CROWN AND BRIDGE: There are no crown or bridge restorations noted. PROSTHODONTIC: The patient denies having partial dentures. OCCLUSION: The patient has a stable occlusion.  RADIOGRAPHIC INTERPRETATION: A suboptimal orthopantogram was taken on 10/26/2017 while in the hospital. This was supplemented with a full series of dental radiographs- today. Patient is missing tooth #16.  Multiple dental caries are noted.  There is incipient a moderate bone loss noted.  Multiple amalgam restorations are noted.  There is periapical pathology and radiolucency associated with apices of tooth #30.  Multiple suboptimal dental restorations are noted.  ASSESSMENTS: 1.  Severe mitral regurgitation 2.  History of  mechanical aortic valve replacement 3.  Chronic anticoagulation 4.  Pre-heart valve surgery dental protocol 5.  Dental caries 6.  Chronic apical periodontitis 7.  Suboptimal dental restorations 8.  Chronic periodontitis with bone loss 9.  Accretions 10.  Gingival recession 11.  Tooth mobility 12.  Stable occlusion 13.  Risk for bleeding with invasive dental procedure due to thrombocytopenia and current warfarin therapy  14.  Risk for complications up to and including death with anticipated invasive dental procedures in the operating room with general anesthesia due to his cardiovascular and respiratory compromise. 15. Bilateral mandibular lingual tori  PLAN/RECOMMENDATIONS: 1. I discussed the risks, benefits, and complications of various treatment options with the patient in relationship to his medical and dental conditions, previous aortic valve replacement, chronic anticoagulation, possible mitral valve replacement, and risk for endocarditis. We discussed various treatment options to include no treatment, multiple extractions with alveoloplasty, pre-prosthetic surgery as indicated, periodontal therapy, dental restorations, root canal therapy, crown and bridge therapy, implant therapy, and replacement of missing teeth as indicated.  The patient currently wishes to proceed with multiple extractions with alveoloplasty and pre-prosthetic surgery as needed as well as gross debridement of remaining dentition in the operating room with general anesthesia.  The warfarin therapy will be discontinued and bridging therapy administered through the Coumadin clinic per Dr. [name].  I will schedule the operating room procedure then contact the Coumadin clinic to coordinate discontinuation of Coumadin therapy and bridging as indicated with either Lovenox or heparin therapy.  The patient will then follow-up with the general dentist of his choice for additional periodontal therapy, dental restorations, evaluation  for replacement of missing teeth as indicated after adequate healing.   2. Discussion of findings with medical team and coordination of future medical and dental care as needed.   Lenn Cal, DDS

## 2017-11-14 NOTE — Patient Instructions (Signed)
Dr. Loralie Champagne will be contacted to discuss treatment plan for multiple extractions with alveoloplasty and gross debridement of remaining dentition in the operating room with general anesthesia. We'll also discuss discontinuation of the Coumadin therapy and Need for bridging with either Lovenox or heparin therapy. We will also discuss the need for hospitalization if indicated. The patient will then need to follow-up with the general dentist of his choice for additional dental restorations and evaluation for replacement of missing teeth as indicated after adequate healing from a dental extraction procedures. Patient is aware of the need for antibiotic premedication prior to invasive dental procedures due to his previous aortic valve replacement per American Heart Association guidelines.  Dr. Enrique Sack

## 2017-11-14 NOTE — Telephone Encounter (Signed)
Copied from [place] [id number]. Topic: General - Other >> Nov 14, 2017  9:17 AM Cecelia Byars, NT wrote: Reason for CRM: Jerilynn from [facility] needs verbal orders for P,T  for  1 x week for 1 week then 2 x week 3 week  then 1 time 3 weeks  please call her at  [phone] ok to leave message

## 2017-11-14 NOTE — Telephone Encounter (Signed)
Okay. Thank you.

## 2017-11-14 NOTE — Patient Instructions (Signed)
Routine lab work today. Will notify you of abnormal results, otherwise no news is good news!  INCREASE Torsemide to 100 mg (5 tabs) in am and 80 mg (4 tabs) in pm.  START Spironolactone 12.5 mg (1/2 tablet) once daily.  Will have [facility] wrap unna boots on legs and draw lab work in 1 week.  Follow up 3 weeks with Lillia Mountain NP-C.  ___________________________________________________________________________ [name] Code:   Take all medication as prescribed the day of your appointment. Bring all medications with you to your appointment.  Do the following things EVERYDAY: 1) Weigh yourself in the morning before breakfast. Write it down and keep it in a log. 2) Take your medicines as prescribed 3) Eat low salt foods-Limit salt (sodium) to 2000 mg per day.  4) Stay as active as you can everyday 5) Limit all fluids for the day to less than 2 liters

## 2017-11-15 ENCOUNTER — Ambulatory Visit (INDEPENDENT_AMBULATORY_CARE_PROVIDER_SITE_OTHER): Payer: Medicare Other | Admitting: Internal Medicine

## 2017-11-15 ENCOUNTER — Other Ambulatory Visit: Payer: Self-pay

## 2017-11-15 DIAGNOSIS — Z952 Presence of prosthetic heart valve: Secondary | ICD-10-CM | POA: Diagnosis not present

## 2017-11-15 DIAGNOSIS — I4891 Unspecified atrial fibrillation: Secondary | ICD-10-CM | POA: Diagnosis not present

## 2017-11-15 DIAGNOSIS — I130 Hypertensive heart and chronic kidney disease with heart failure and stage 1 through stage 4 chronic kidney disease, or unspecified chronic kidney disease: Secondary | ICD-10-CM | POA: Diagnosis not present

## 2017-11-15 DIAGNOSIS — I5033 Acute on chronic diastolic (congestive) heart failure: Secondary | ICD-10-CM | POA: Diagnosis not present

## 2017-11-15 DIAGNOSIS — Z7189 Other specified counseling: Secondary | ICD-10-CM

## 2017-11-15 DIAGNOSIS — E1122 Type 2 diabetes mellitus with diabetic chronic kidney disease: Secondary | ICD-10-CM | POA: Diagnosis not present

## 2017-11-15 DIAGNOSIS — I2510 Atherosclerotic heart disease of native coronary artery without angina pectoris: Secondary | ICD-10-CM | POA: Diagnosis not present

## 2017-11-15 DIAGNOSIS — D631 Anemia in chronic kidney disease: Secondary | ICD-10-CM | POA: Diagnosis not present

## 2017-11-15 DIAGNOSIS — N183 Chronic kidney disease, stage 3 (moderate): Secondary | ICD-10-CM | POA: Diagnosis not present

## 2017-11-15 NOTE — Patient Outreach (Signed)
[place] [facility]) Care Management  11/15/2017  [name] 02-23-1936 [id number]     EMMI-HF RED ON EMMI ALERT Day # 7 Date: 11/14/17 Red Alert Reason: "Weight: 222 lbs, New/worsening problems? Yes  New swelling? Yes"   Outreach attempt # 1 to patient. Spoke with patient who was very short with his responses. Reviewed and addressed red alerts with patient. Patient weight ranging from 220-222 lbs. He states that he saw his "heart care team" on yesterday. He had his prescription for diuretics changed. Patient then voiced that he is "annoyed by all these calls" especially since the issue has already been taken care. He wishes to stop calls. He declines needing [facility] services or assistance and that he is able to manage his CHF on his own. He reports that he is feeling fine today and his swelling is improving.       Plan: RN CM will close case. RN CM will notify [facility] administrative assistant to deactivate EMMI-HF calls per patient request.    [name] [place] Management Telephonic Care Management Coordinator Direct Phone: [phone] Toll Free: [phone] Fax: [phone]

## 2017-11-15 NOTE — Telephone Encounter (Signed)
Called [name] no answer LMOM w/MD response.Marland KitchenJohny Chess

## 2017-11-16 ENCOUNTER — Encounter: Payer: Self-pay | Admitting: Interventional Cardiology

## 2017-11-16 ENCOUNTER — Ambulatory Visit (INDEPENDENT_AMBULATORY_CARE_PROVIDER_SITE_OTHER): Payer: Medicare Other | Admitting: Interventional Cardiology

## 2017-11-16 VITALS — BP 116/62 | HR 99 | Ht 74.0 in | Wt 227.8 lb

## 2017-11-16 DIAGNOSIS — I10 Essential (primary) hypertension: Secondary | ICD-10-CM | POA: Diagnosis not present

## 2017-11-16 DIAGNOSIS — I482 Chronic atrial fibrillation, unspecified: Secondary | ICD-10-CM

## 2017-11-16 DIAGNOSIS — I340 Nonrheumatic mitral (valve) insufficiency: Secondary | ICD-10-CM

## 2017-11-16 DIAGNOSIS — G4733 Obstructive sleep apnea (adult) (pediatric): Secondary | ICD-10-CM | POA: Diagnosis not present

## 2017-11-16 DIAGNOSIS — I2510 Atherosclerotic heart disease of native coronary artery without angina pectoris: Secondary | ICD-10-CM

## 2017-11-16 DIAGNOSIS — I5033 Acute on chronic diastolic (congestive) heart failure: Secondary | ICD-10-CM | POA: Diagnosis not present

## 2017-11-16 DIAGNOSIS — I5032 Chronic diastolic (congestive) heart failure: Secondary | ICD-10-CM

## 2017-11-16 DIAGNOSIS — E1122 Type 2 diabetes mellitus with diabetic chronic kidney disease: Secondary | ICD-10-CM | POA: Diagnosis not present

## 2017-11-16 DIAGNOSIS — I25708 Atherosclerosis of coronary artery bypass graft(s), unspecified, with other forms of angina pectoris: Secondary | ICD-10-CM

## 2017-11-16 DIAGNOSIS — D631 Anemia in chronic kidney disease: Secondary | ICD-10-CM | POA: Diagnosis not present

## 2017-11-16 DIAGNOSIS — N183 Chronic kidney disease, stage 3 (moderate): Secondary | ICD-10-CM | POA: Diagnosis not present

## 2017-11-16 DIAGNOSIS — I130 Hypertensive heart and chronic kidney disease with heart failure and stage 1 through stage 4 chronic kidney disease, or unspecified chronic kidney disease: Secondary | ICD-10-CM | POA: Diagnosis not present

## 2017-11-16 NOTE — Progress Notes (Signed)
Cardiology Office Note:    Date:  11/16/2017   ID:  [name], DOB 03-07-1936, MRN [id number]  PCP:  Cassandria Anger, MD  Cardiologist:  [name], MD   Referring MD: Cassandria Anger, MD   Chief Complaint  Patient presents with  . Atrial Fibrillation  . Coronary Artery Disease    History of Present Illness:    [name] is a 82 y.o. male with a hx of with mechanical aortic valve 1994, obstructive sleep apnea on chronic CPAP, chronic atrial fibrillation, chronic combined systolic and diastolic heart failure, chronic stage III CKD, Coumadin therapy, prior CABG 1994, and recently diagnosed with COPD.  Recent hospital stay after decompensation and development of anasarca led to the diagnosis of acute on chronic diastolic heart failure and severe mitral regurgitation in the setting of a rheumatic valve.  She was hospitalized over 2 weeks ago with severe anasarca.  Was seen and managed in the hospital by Dr. [name] in the advanced heart failure team.  He diuresed nearly 60 pounds.  He is back today for clinical follow-up.  Just seen in the advanced heart failure clinic approximately 2 days ago.  He is breathing much better.  He denies chest pain.  Has not had syncope.  Appetite is increased.  Able to lie flat without dyspnea.  Still wearing oxygen 24/7.  Legs are still swollen.  He was considered for MitraClip but felt not to be a candidate because his valve is rheumatic.   Past Medical History:  Diagnosis Date  . Carotid artery disease ([place]) 1994   s/p left carotid endarerectomy   . Chronic atrial fibrillation (HCC)    a. on coumadin   . Chronic diastolic CHF (congestive heart failure) ([place])   . Hx of CABG    a. 1994  . Hypertension   . OSA (obstructive sleep apnea)   . Rheumatic fever   . S/P AVR (aortic valve replacement)    a. mechanical valve 1996  . Subclavian bypass stenosis ([place])   . Vitamin B12 deficiency     Past Surgical History:  Procedure  Laterality Date  . [place]   replaced due to aortic stenosis, [employer] mechanical prostesis  . CAROTID ENDARTERECTOMY Left 1997   subclavian bypass Done in [place]  . CORONARY ARTERY BYPASS GRAFT  1994   w SVG to RCA and SVG to circumflex  . heart bypass     Done in [place]  . TEE WITHOUT CARDIOVERSION N/A 10/23/2017   Procedure: TRANSESOPHAGEAL ECHOCARDIOGRAM (TEE);  Surgeon: Larey Dresser, MD;  Location: [facility] ENDOSCOPY;  Service: Cardiovascular;  Laterality: N/A;    Current Medications: Current Meds  Medication Sig  . acetaminophen (TYLENOL) 500 MG tablet Take 500-1,000 mg by mouth daily as needed (for pain).   . Cholecalciferol (VITAMIN D3) 2000 units capsule Take 1 capsule (2,000 Units total) by mouth daily.  . colchicine 0.6 MG tablet Take 1 tablet (0.6 mg total) by mouth daily.  . Cyanocobalamin (VITAMIN B-12) 3000 MCG SUBL Take 3,000 mcg by mouth daily.   . Dietary Management Product (VASCULERA) TABS Take 1 tablet by mouth daily.  Marland Kitchen docusate sodium (COLACE) 100 MG capsule Take 1 capsule (100 mg total) by mouth 2 (two) times daily.  Marland Kitchen ezetimibe-simvastatin (VYTORIN) 10-20 MG tablet Take 1 tablet by mouth at bedtime.  . fluocinonide cream (LIDEX) 2.99 % Apply 1 application topically 2 (two) times daily as needed (For legs).  . folic acid (  FOLVITE) 1 MG tablet Take 1 tablet (1 mg total) by mouth daily.  Marland Kitchen glucose blood (ONE TOUCH ULTRA TEST) test strip USE AS DIRECTED TO TEST BLOOD GLUCOSE  EVERY OTHER DAY DX: 250.00  . glucose blood (ONETOUCH VERIO) test strip Use as instructed to check blood sugar once daily. Dx Code E11.8  . hydrocortisone cream 1 % Apply 1 application topically as needed for itching (for legs).  Marland Kitchen ipratropium-albuterol (DUONEB) 0.5-2.5 (3) MG/3ML SOLN Take 3 mLs by nebulization 4 (four) times daily.  . metFORMIN (GLUCOPHAGE-XR) 750 MG 24 hr tablet TAKE 2 TABLETS (1,500 MG TOTAL) BY MOUTH DAILY.  . NON FORMULARY BiPAP: At bedtime  . NON  FORMULARY ONE TOUCH ULTRA TEST STRIPS  AND LANCETS  . ONETOUCH DELICA LANCETS FINE MISC Use to obtain a blood specimen every day Dx code E11.8  . pantoprazole (PROTONIX) 40 MG tablet Take 1 tablet (40 mg total) by mouth daily.  . polyethylene glycol (MIRALAX / GLYCOLAX) packet Take 17 g by mouth daily. Hold for diarrhea.  . potassium chloride SA (K-DUR,KLOR-CON) 20 MEQ tablet Take 3 tablets (60 mEq total) by mouth 2 (two) times daily.  Marland Kitchen PROAIR HFA 108 (90 Base) MCG/ACT inhaler INHALE 2 PUFFS INTO THE LUNGS EVERY 6 HOURS AS NEEDED (FOR CHEST CONGESTION).  Marland Kitchen senna (SENOKOT) 8.6 MG TABS tablet Take 1 tablet (8.6 mg total) by mouth daily as needed for mild constipation.  Marland Kitchen spironolactone (ALDACTONE) 25 MG tablet Take 0.5 tablets (12.5 mg total) by mouth daily.  Marland Kitchen torsemide (DEMADEX) 20 MG tablet Take 100 mg (5 tabs) in am and 80 mg (4 tabs) in pm  . warfarin (COUMADIN) 5 MG tablet Take 10 mg (2 tabs) on 8/5, then 7.5 mg (1.5 tabs) until follow up in coumadin clinic, then take as directed.     Allergies:   Rifampin and Vancomycin   Social History   Socioeconomic History  . Marital status: Married    Spouse name: Not on file  . Number of children: 3  . Years of education: Not on file  . Highest education level: Not on file  Occupational History  . Not on file  Social Needs  . Financial resource strain: Not on file  . Food insecurity:    Worry: Not on file    Inability: Not on file  . Transportation needs:    Medical: Not on file    Non-medical: Not on file  Tobacco Use  . Smoking status: Former Smoker    Packs/day: 1.00    Years: 30.00    Pack years: 30.00    Types: Cigarettes    Last attempt to quit: 04/03/1992    Years since quitting: 25.6  . Smokeless tobacco: Never Used  Substance and Sexual Activity  . Alcohol use: Yes    Comment: 4-6 ounces of wine daily  . Drug use: No    Comment: Half a cup a day.  Marland Kitchen Sexual activity: Not on file  Lifestyle  . Physical activity:     Days per week: Not on file    Minutes per session: Not on file  . Stress: Not on file  Relationships  . Social connections:    Talks on phone: Not on file    Gets together: Not on file    Attends religious service: Not on file    Active member of club or organization: Not on file    Attends meetings of clubs or organizations: Not on file    Relationship  status: Not on file  Other Topics Concern  . Not on file  Social History Narrative   Patient is married and lives with his wife.   Youngest daughter lives in [place] also.   Patient has 2 other children.     Family History: The patient's family history includes Cancer in his father; Hypertension in his mother.  ROS:   Please see the history of present illness.    Weight loss, decreased energy although improved breathing.  All other systems reviewed and are negative.  EKGs/Labs/Other Studies Reviewed:    The following studies were reviewed today: Transesophageal echocardiography October 23, 2017: ------------------------------------------------------------------- Study Conclusions  - Left ventricle: The cavity size was normal. Wall thickness was   increased in a pattern of mild LVH. Systolic function was normal.   The estimated ejection fraction was in the range of 55% to 60%.   Wall motion was normal; there were no regional wall motion   abnormalities. D-shaped septum consistent with RV pressure/volume   overload. - Aortic valve: Mechanical aortic valve with no significant   regurgitation, unable to obtain mean gradient as transgastric   windows were poor. - Aorta: Normal caliber ascending aorta. - Mitral valve: The mitral valve was calcified and thickened,   especially the posterior leaflet that was restricted. Mean   gradient 5 mmHg but MVA by PHT > 2 cm^2. I think there was only   minimal mitral stenosis. There was severe mitral regurgitation,   likely due to restriction of posterior leaflet and annular   dilation  from severe LAE (mixed functional and primary etiology).   ERO 0.42 cm^2 by PISA. - Left atrium: The atrium was severely dilated. No evidence of   thrombus in the atrial cavity or appendage. - Pulmonary veins: There was systolic flow reversal in the   pulmonary vein doppler pattern. - Right ventricle: The cavity size was mildly dilated. Systolic   function was mildly reduced. - Right atrium: The atrium was moderately dilated. - Atrial septum: No defect or patent foramen ovale was identified. - Tricuspid valve: Mild tricuspid regurgitation with peak RV-RA   gradient 25 mmHg.  Impressions:  - Severe mitral regurgitation with minimal mitral stenosis (suspect   mildly elevated mean gradient with normal pressure hafl-time was   due to high flow from mitral regurgitation). 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 Suspect mixed primary and secondary etiology.   EKG:  EKG is not ordered today.    Recent Labs: 07/10/2017: Pro B Natriuretic peptide (BNP) 411.0 10/18/2017: ALT 15; B Natriuretic Peptide 1,073.3; TSH 5.026 11/04/2017: Magnesium 2.4 11/14/2017: BUN 37; Creatinine, Ser 1.26; Hemoglobin 7.9; Platelets 132; Potassium 4.8; Sodium 132  Recent Lipid Panel    Component Value Date/Time   CHOL 104 08/24/2017 0925   TRIG 68.0 08/24/2017 0925   HDL 50.80 08/24/2017 0925   CHOLHDL 2 08/24/2017 0925   VLDL 13.6 08/24/2017 0925   LDLCALC 40 08/24/2017 0925    Physical Exam:    VS:  BP 116/62   Pulse 99   Ht 6\' 2"  (1.88 m)   Wt 227 lb 12.8 oz (103.3 kg)   BMI 29.25 kg/m     Wt Readings from Last 3 Encounters:  11/16/17 227 lb 12.8 oz (103.3 kg)  11/14/17 225 lb (102.1 kg)  11/05/17 216 lb 4.8 oz (98.1 kg)     GEN: Frail/cachectic appearing.  No acute distress HEENT: Normal NECK: No JVD while sitting.  CV wave is noted. LYMPHATICS: No lymphadenopathy  CARDIAC: IIRR, 3/6 to 4/6 holosystolic apical to left axillary systolic mitral regurgitant murmur, no gallop, 3+ ankles to upper  shin edema.  Bilaterally. VASCULAR: 2+ bilateral radial pulses.  No bruits. RESPIRATORY:  Clear to auscultation without rales, wheezing or rhonchi  ABDOMEN: Soft, non-tender, non-distended, No pulsatile mass, MUSCULOSKELETAL: No deformity  SKIN: Warm and dry NEUROLOGIC:  Alert and oriented x 3 PSYCHIATRIC:  Normal affect   ASSESSMENT:    1. Chronic diastolic heart failure ([place])   2. Severe mitral regurgitation   3. Coronary artery disease involving native heart without angina pectoris, unspecified vessel or lesion type   4. OSA (obstructive sleep apnea)   5. Essential hypertension   6. Chronic atrial fibrillation (HCC)    PLAN:    In order of problems listed above:  1. Volume status is markedly improved after hospital stay and excellent management by the advanced heart failure team.  Aldactone was recently added.  He required metolazone to diurese while hospitalized.  He is still on significant torsemide intensity.  This is being managed by advanced heart failure team. 2. Severe mitral regurgitation driving the patient's right heart failure and producing low output.  Not likely a candidate for MitraClip due to rheumatic valve. 3. No clinical symptoms to suggest ischemia. 4. Not addressed although they wanted me to ask Dr. Radford Pax to be sure that she has authorized prescription for new equipment as they discussed on the last office visit. 5. Not a concern. 6. Good heart rate control.  Plan to manage the patient secondarily with the advanced heart failure team as the lead on his care.  We will be careful not to overlap to closely any office visits.  I discussed this with the patient and his wife.  Overall prognosis is guarded.  Will continue to follow but does not appear to be many options other than strong medical therapy.  I will be happy to have this conversation concerning palliative care/hospice if necessary since I have a long-standing relationship with both he and his  wife.   Medication Adjustments/Labs and Tests Ordered: Current medicines are reviewed at length with the patient today.  Concerns regarding medicines are outlined above.  No orders of the defined types were placed in this encounter.  No orders of the defined types were placed in this encounter.   Patient Instructions  Medication Instructions:  Your physician recommends that you continue on your current medications as directed. Please refer to the Current Medication list given to you today.  Labwork: None  Testing/Procedures: None  Follow-Up: Your physician recommends that you schedule a follow-up appointment in: November or December with Dr. Tamala Julian.    Any Other Special Instructions Will Be Listed Below (If Applicable).     If you need a refill on your cardiac medications before your next appointment, please call your pharmacy.      Signed, [name], MD  11/16/2017 3:07 PM    [facility]

## 2017-11-16 NOTE — Patient Instructions (Signed)
Medication Instructions:  Your physician recommends that you continue on your current medications as directed. Please refer to the Current Medication list given to you today.  Labwork: None  Testing/Procedures: None  Follow-Up: Your physician recommends that you schedule a follow-up appointment in: November or December with Dr. Tamala Julian.    Any Other Special Instructions Will Be Listed Below (If Applicable).     If you need a refill on your cardiac medications before your next appointment, please call your pharmacy.

## 2017-11-19 DIAGNOSIS — I5033 Acute on chronic diastolic (congestive) heart failure: Secondary | ICD-10-CM | POA: Diagnosis not present

## 2017-11-19 DIAGNOSIS — E1122 Type 2 diabetes mellitus with diabetic chronic kidney disease: Secondary | ICD-10-CM | POA: Diagnosis not present

## 2017-11-19 DIAGNOSIS — I130 Hypertensive heart and chronic kidney disease with heart failure and stage 1 through stage 4 chronic kidney disease, or unspecified chronic kidney disease: Secondary | ICD-10-CM | POA: Diagnosis not present

## 2017-11-19 DIAGNOSIS — N183 Chronic kidney disease, stage 3 (moderate): Secondary | ICD-10-CM | POA: Diagnosis not present

## 2017-11-19 DIAGNOSIS — D631 Anemia in chronic kidney disease: Secondary | ICD-10-CM | POA: Diagnosis not present

## 2017-11-19 DIAGNOSIS — I2510 Atherosclerotic heart disease of native coronary artery without angina pectoris: Secondary | ICD-10-CM | POA: Diagnosis not present

## 2017-11-20 ENCOUNTER — Ambulatory Visit (INDEPENDENT_AMBULATORY_CARE_PROVIDER_SITE_OTHER): Payer: Medicare Other

## 2017-11-20 DIAGNOSIS — I5033 Acute on chronic diastolic (congestive) heart failure: Secondary | ICD-10-CM | POA: Diagnosis not present

## 2017-11-20 DIAGNOSIS — Z952 Presence of prosthetic heart valve: Secondary | ICD-10-CM | POA: Diagnosis not present

## 2017-11-20 DIAGNOSIS — Z7189 Other specified counseling: Secondary | ICD-10-CM | POA: Diagnosis not present

## 2017-11-20 DIAGNOSIS — N183 Chronic kidney disease, stage 3 (moderate): Secondary | ICD-10-CM | POA: Diagnosis not present

## 2017-11-20 DIAGNOSIS — E1122 Type 2 diabetes mellitus with diabetic chronic kidney disease: Secondary | ICD-10-CM | POA: Diagnosis not present

## 2017-11-20 DIAGNOSIS — D631 Anemia in chronic kidney disease: Secondary | ICD-10-CM | POA: Diagnosis not present

## 2017-11-20 DIAGNOSIS — I130 Hypertensive heart and chronic kidney disease with heart failure and stage 1 through stage 4 chronic kidney disease, or unspecified chronic kidney disease: Secondary | ICD-10-CM | POA: Diagnosis not present

## 2017-11-20 DIAGNOSIS — I4891 Unspecified atrial fibrillation: Secondary | ICD-10-CM

## 2017-11-20 DIAGNOSIS — I5020 Unspecified systolic (congestive) heart failure: Secondary | ICD-10-CM | POA: Diagnosis not present

## 2017-11-20 DIAGNOSIS — I2510 Atherosclerotic heart disease of native coronary artery without angina pectoris: Secondary | ICD-10-CM | POA: Diagnosis not present

## 2017-11-20 LAB — POCT INR: INR: 1.9 — AB (ref 2.0–3.0)

## 2017-11-20 NOTE — Patient Instructions (Signed)
Take 1.5 tablets today, then start taking 1.5 tablets daily. Repeat INR in 1 week.  Orders given to Tiara with [facility].  Call with any new medications or procedures [phone].

## 2017-11-21 ENCOUNTER — Other Ambulatory Visit (HOSPITAL_COMMUNITY): Payer: Self-pay | Admitting: Adult Health

## 2017-11-21 DIAGNOSIS — G4733 Obstructive sleep apnea (adult) (pediatric): Secondary | ICD-10-CM

## 2017-11-21 DIAGNOSIS — I2510 Atherosclerotic heart disease of native coronary artery without angina pectoris: Secondary | ICD-10-CM | POA: Diagnosis not present

## 2017-11-21 DIAGNOSIS — J449 Chronic obstructive pulmonary disease, unspecified: Secondary | ICD-10-CM

## 2017-11-21 DIAGNOSIS — Z7984 Long term (current) use of oral hypoglycemic drugs: Secondary | ICD-10-CM

## 2017-11-21 DIAGNOSIS — I5033 Acute on chronic diastolic (congestive) heart failure: Secondary | ICD-10-CM

## 2017-11-21 DIAGNOSIS — Z9981 Dependence on supplemental oxygen: Secondary | ICD-10-CM

## 2017-11-21 DIAGNOSIS — Z951 Presence of aortocoronary bypass graft: Secondary | ICD-10-CM

## 2017-11-21 DIAGNOSIS — M109 Gout, unspecified: Secondary | ICD-10-CM

## 2017-11-21 DIAGNOSIS — Z87891 Personal history of nicotine dependence: Secondary | ICD-10-CM

## 2017-11-21 DIAGNOSIS — D696 Thrombocytopenia, unspecified: Secondary | ICD-10-CM

## 2017-11-21 DIAGNOSIS — Z952 Presence of prosthetic heart valve: Secondary | ICD-10-CM

## 2017-11-21 DIAGNOSIS — I482 Chronic atrial fibrillation: Secondary | ICD-10-CM

## 2017-11-21 DIAGNOSIS — Z7901 Long term (current) use of anticoagulants: Secondary | ICD-10-CM

## 2017-11-21 DIAGNOSIS — N183 Chronic kidney disease, stage 3 (moderate): Secondary | ICD-10-CM

## 2017-11-21 DIAGNOSIS — Z5181 Encounter for therapeutic drug level monitoring: Secondary | ICD-10-CM

## 2017-11-21 DIAGNOSIS — I130 Hypertensive heart and chronic kidney disease with heart failure and stage 1 through stage 4 chronic kidney disease, or unspecified chronic kidney disease: Secondary | ICD-10-CM

## 2017-11-21 DIAGNOSIS — E1122 Type 2 diabetes mellitus with diabetic chronic kidney disease: Secondary | ICD-10-CM

## 2017-11-21 DIAGNOSIS — D631 Anemia in chronic kidney disease: Secondary | ICD-10-CM

## 2017-11-23 DIAGNOSIS — N183 Chronic kidney disease, stage 3 (moderate): Secondary | ICD-10-CM | POA: Diagnosis not present

## 2017-11-23 DIAGNOSIS — I5033 Acute on chronic diastolic (congestive) heart failure: Secondary | ICD-10-CM | POA: Diagnosis not present

## 2017-11-23 DIAGNOSIS — E1122 Type 2 diabetes mellitus with diabetic chronic kidney disease: Secondary | ICD-10-CM | POA: Diagnosis not present

## 2017-11-23 DIAGNOSIS — I2510 Atherosclerotic heart disease of native coronary artery without angina pectoris: Secondary | ICD-10-CM | POA: Diagnosis not present

## 2017-11-23 DIAGNOSIS — I130 Hypertensive heart and chronic kidney disease with heart failure and stage 1 through stage 4 chronic kidney disease, or unspecified chronic kidney disease: Secondary | ICD-10-CM | POA: Diagnosis not present

## 2017-11-23 DIAGNOSIS — D631 Anemia in chronic kidney disease: Secondary | ICD-10-CM | POA: Diagnosis not present

## 2017-11-26 ENCOUNTER — Telehealth: Payer: Self-pay | Admitting: Pharmacist

## 2017-11-26 ENCOUNTER — Other Ambulatory Visit (HOSPITAL_COMMUNITY): Payer: Self-pay | Admitting: Dentistry

## 2017-11-26 DIAGNOSIS — E1122 Type 2 diabetes mellitus with diabetic chronic kidney disease: Secondary | ICD-10-CM | POA: Diagnosis not present

## 2017-11-26 DIAGNOSIS — N183 Chronic kidney disease, stage 3 (moderate): Secondary | ICD-10-CM | POA: Diagnosis not present

## 2017-11-26 DIAGNOSIS — D631 Anemia in chronic kidney disease: Secondary | ICD-10-CM | POA: Diagnosis not present

## 2017-11-26 DIAGNOSIS — I2510 Atherosclerotic heart disease of native coronary artery without angina pectoris: Secondary | ICD-10-CM | POA: Diagnosis not present

## 2017-11-26 DIAGNOSIS — I5033 Acute on chronic diastolic (congestive) heart failure: Secondary | ICD-10-CM | POA: Diagnosis not present

## 2017-11-26 DIAGNOSIS — I130 Hypertensive heart and chronic kidney disease with heart failure and stage 1 through stage 4 chronic kidney disease, or unspecified chronic kidney disease: Secondary | ICD-10-CM | POA: Diagnosis not present

## 2017-11-26 NOTE — Telephone Encounter (Signed)
Received call from Dr Enrique Sack that pt will need to hold warfarin for 5 days with Lovenox bridge prior to multiple dental extractions on 9/11. Pt  Has hx of mechanical AVR and afib and is followed in [place] office. Home health is currently checking INR, pt will need to be scheduled in clinic to coordinate Lovenox bridge. Will route to [facility] to help coordinate bridge.

## 2017-11-27 ENCOUNTER — Ambulatory Visit (INDEPENDENT_AMBULATORY_CARE_PROVIDER_SITE_OTHER): Payer: Medicare Other | Admitting: Interventional Cardiology

## 2017-11-27 DIAGNOSIS — Z952 Presence of prosthetic heart valve: Secondary | ICD-10-CM

## 2017-11-27 DIAGNOSIS — I4891 Unspecified atrial fibrillation: Secondary | ICD-10-CM

## 2017-11-27 DIAGNOSIS — N183 Chronic kidney disease, stage 3 (moderate): Secondary | ICD-10-CM | POA: Diagnosis not present

## 2017-11-27 DIAGNOSIS — Z7189 Other specified counseling: Secondary | ICD-10-CM | POA: Diagnosis not present

## 2017-11-27 DIAGNOSIS — I2510 Atherosclerotic heart disease of native coronary artery without angina pectoris: Secondary | ICD-10-CM | POA: Diagnosis not present

## 2017-11-27 DIAGNOSIS — E1122 Type 2 diabetes mellitus with diabetic chronic kidney disease: Secondary | ICD-10-CM | POA: Diagnosis not present

## 2017-11-27 DIAGNOSIS — D631 Anemia in chronic kidney disease: Secondary | ICD-10-CM | POA: Diagnosis not present

## 2017-11-27 DIAGNOSIS — I130 Hypertensive heart and chronic kidney disease with heart failure and stage 1 through stage 4 chronic kidney disease, or unspecified chronic kidney disease: Secondary | ICD-10-CM | POA: Diagnosis not present

## 2017-11-27 DIAGNOSIS — I5033 Acute on chronic diastolic (congestive) heart failure: Secondary | ICD-10-CM | POA: Diagnosis not present

## 2017-11-27 LAB — POCT INR: INR: 2 (ref 2.0–3.0)

## 2017-11-28 DIAGNOSIS — N183 Chronic kidney disease, stage 3 (moderate): Secondary | ICD-10-CM | POA: Diagnosis not present

## 2017-11-28 DIAGNOSIS — I5033 Acute on chronic diastolic (congestive) heart failure: Secondary | ICD-10-CM | POA: Diagnosis not present

## 2017-11-28 DIAGNOSIS — I2510 Atherosclerotic heart disease of native coronary artery without angina pectoris: Secondary | ICD-10-CM | POA: Diagnosis not present

## 2017-11-28 DIAGNOSIS — E1122 Type 2 diabetes mellitus with diabetic chronic kidney disease: Secondary | ICD-10-CM | POA: Diagnosis not present

## 2017-11-28 DIAGNOSIS — D631 Anemia in chronic kidney disease: Secondary | ICD-10-CM | POA: Diagnosis not present

## 2017-11-28 DIAGNOSIS — I130 Hypertensive heart and chronic kidney disease with heart failure and stage 1 through stage 4 chronic kidney disease, or unspecified chronic kidney disease: Secondary | ICD-10-CM | POA: Diagnosis not present

## 2017-11-29 ENCOUNTER — Telehealth (HOSPITAL_COMMUNITY): Payer: Self-pay | Admitting: Cardiology

## 2017-11-29 DIAGNOSIS — D649 Anemia, unspecified: Secondary | ICD-10-CM

## 2017-11-30 DIAGNOSIS — D631 Anemia in chronic kidney disease: Secondary | ICD-10-CM | POA: Diagnosis not present

## 2017-11-30 DIAGNOSIS — N183 Chronic kidney disease, stage 3 (moderate): Secondary | ICD-10-CM | POA: Diagnosis not present

## 2017-11-30 DIAGNOSIS — I130 Hypertensive heart and chronic kidney disease with heart failure and stage 1 through stage 4 chronic kidney disease, or unspecified chronic kidney disease: Secondary | ICD-10-CM | POA: Diagnosis not present

## 2017-11-30 DIAGNOSIS — E1122 Type 2 diabetes mellitus with diabetic chronic kidney disease: Secondary | ICD-10-CM | POA: Diagnosis not present

## 2017-11-30 DIAGNOSIS — D696 Thrombocytopenia, unspecified: Secondary | ICD-10-CM | POA: Diagnosis not present

## 2017-11-30 DIAGNOSIS — I2510 Atherosclerotic heart disease of native coronary artery without angina pectoris: Secondary | ICD-10-CM | POA: Diagnosis not present

## 2017-11-30 DIAGNOSIS — Z5181 Encounter for therapeutic drug level monitoring: Secondary | ICD-10-CM | POA: Diagnosis not present

## 2017-11-30 DIAGNOSIS — I5033 Acute on chronic diastolic (congestive) heart failure: Secondary | ICD-10-CM | POA: Diagnosis not present

## 2017-12-03 DIAGNOSIS — E1122 Type 2 diabetes mellitus with diabetic chronic kidney disease: Secondary | ICD-10-CM | POA: Diagnosis not present

## 2017-12-03 DIAGNOSIS — I5033 Acute on chronic diastolic (congestive) heart failure: Secondary | ICD-10-CM | POA: Diagnosis not present

## 2017-12-03 DIAGNOSIS — D631 Anemia in chronic kidney disease: Secondary | ICD-10-CM | POA: Diagnosis not present

## 2017-12-03 DIAGNOSIS — N183 Chronic kidney disease, stage 3 (moderate): Secondary | ICD-10-CM | POA: Diagnosis not present

## 2017-12-03 DIAGNOSIS — I2510 Atherosclerotic heart disease of native coronary artery without angina pectoris: Secondary | ICD-10-CM | POA: Diagnosis not present

## 2017-12-03 DIAGNOSIS — I130 Hypertensive heart and chronic kidney disease with heart failure and stage 1 through stage 4 chronic kidney disease, or unspecified chronic kidney disease: Secondary | ICD-10-CM | POA: Diagnosis not present

## 2017-12-04 DIAGNOSIS — I5033 Acute on chronic diastolic (congestive) heart failure: Secondary | ICD-10-CM | POA: Diagnosis not present

## 2017-12-04 DIAGNOSIS — N183 Chronic kidney disease, stage 3 (moderate): Secondary | ICD-10-CM | POA: Diagnosis not present

## 2017-12-04 DIAGNOSIS — E1122 Type 2 diabetes mellitus with diabetic chronic kidney disease: Secondary | ICD-10-CM | POA: Diagnosis not present

## 2017-12-04 DIAGNOSIS — I130 Hypertensive heart and chronic kidney disease with heart failure and stage 1 through stage 4 chronic kidney disease, or unspecified chronic kidney disease: Secondary | ICD-10-CM | POA: Diagnosis not present

## 2017-12-04 DIAGNOSIS — I2510 Atherosclerotic heart disease of native coronary artery without angina pectoris: Secondary | ICD-10-CM | POA: Diagnosis not present

## 2017-12-04 DIAGNOSIS — D631 Anemia in chronic kidney disease: Secondary | ICD-10-CM | POA: Diagnosis not present

## 2017-12-04 NOTE — Progress Notes (Signed)
Advanced Heart Failure Clinic Note   PCP: [name], [name], MD PCP-Cardiologist: [name], MD  HF: [name]  HPI: [name] a 82 y.o.malewith h/o mechanical AVR 1994, OSA on CPAP, Chronic afib, chronic combined CHF, CKD III, chronic coumadin therapy, CAD s/p CAGB 1994, and COPD.   Admitted 7/18-11/05/17 from [facility] office with A/C diastolic HF. Diuresed 83 lbs with lasix drip, metolazone, and diamox. Transitioned to torsemide 80 mg BID. Determined not to be a candidate for MitraClip. Dr [name] also consulted and recommended aggressive medical therapy versus possible experimental percutaneous MVR as he felt very high risk for conventional surgery. Coarse complicated by drop in hemoglobin and symptomatic hypotension, requiring 1 unit pRBCs. GI consulted and did not recommend scoping. BB and spiro were stopped due to low BPs. Had some AKI assoicated with low BP, but resolved by discharge. Required lovenox bridge at DC with subtherapeutic INR. Discharged with [place] [facility]. DC weight: 216 lbs.   He presents today for regular follow up with his wife. Last visit torsemide was increased and spiro was started. Overall doing well. He has been working with [facility] PT and is getting stronger. Able to walk 1/2 block with walker. Denies SOB, orthopnea, or PND. He has BLE edema that has been getting worse. Has unna boots. Wearing O2 at all times and BiPAP qHS. He has good UOP with am torsemide, but not as much with pm dose. He had a small nosebleed this morning, but no other bleeding. Plans to get tooth extraction next week. He says coumadin clinic will have him on lovenox/coumadin bridge. He says dentist will give him antibiotics. Taking all medications. Limits fluid and salt. Weights have gone up 229 > 235 lbs in the last couple weeks.   Review of systems complete and found to be negative unless listed in HPI.   Past Medical History:  Diagnosis Date  . Carotid artery disease ([place]) 1994   s/p left  carotid endarerectomy   . Chronic atrial fibrillation ([facility])    a. on coumadin   . Chronic diastolic CHF (congestive heart failure) ([place])   . Hx of CABG    a. 1994  . Hypertension   . OSA (obstructive sleep apnea)   . Rheumatic fever   . S/P AVR (aortic valve replacement)    a. mechanical valve 1996  . Subclavian bypass stenosis ([place])   . Vitamin B12 deficiency     Current Outpatient Medications  Medication Sig Dispense Refill  . Cholecalciferol (VITAMIN D3) 2000 units capsule Take 1 capsule (2,000 Units total) by mouth daily. 100 capsule 3  . colchicine 0.6 MG tablet Take 1 tablet (0.6 mg total) by mouth daily. 30 tablet 0  . Cyanocobalamin (VITAMIN B-12) 3000 MCG SUBL Take 3,000 mcg by mouth daily.     . Dietary Management Product (VASCULERA) TABS Take 1 tablet by mouth daily. 90 tablet 3  . docusate sodium (COLACE) 100 MG capsule Take 1 capsule (100 mg total) by mouth 2 (two) times daily. 10 capsule 0  . ezetimibe-simvastatin (VYTORIN) 10-20 MG tablet Take 1 tablet by mouth at bedtime. 90 tablet 3  . folic acid (FOLVITE) 1 MG tablet Take 1 tablet (1 mg total) by mouth daily.    Marland Kitchen glucose blood (ONE TOUCH ULTRA TEST) test strip USE AS DIRECTED TO TEST BLOOD GLUCOSE  EVERY OTHER DAY DX: 250.00 50 each 2  . glucose blood (ONETOUCH VERIO) test strip Use as instructed to check blood sugar once daily. Dx  Code E11.8 100 each 3  . hydrocortisone cream 1 % Apply 1 application topically as needed for itching (for legs).    Marland Kitchen ipratropium-albuterol (DUONEB) 0.5-2.5 (3) MG/3ML SOLN Take 3 mLs by nebulization 4 (four) times daily. 360 mL 3  . metFORMIN (GLUCOPHAGE-XR) 750 MG 24 hr tablet TAKE 2 TABLETS (1,500 MG TOTAL) BY MOUTH DAILY. 180 tablet 2  . NON FORMULARY BiPAP: At bedtime    . ONETOUCH DELICA LANCETS FINE MISC Use to obtain a blood specimen every day Dx code E11.8 100 each 1  . pantoprazole (PROTONIX) 40 MG tablet Take 1 tablet (40 mg total) by mouth daily. 14 tablet 0  . polyethylene  glycol (MIRALAX / GLYCOLAX) packet Take 17 g by mouth daily. Hold for diarrhea. 14 each 0  . potassium chloride SA (K-DUR,KLOR-CON) 20 MEQ tablet Take 3 tablets (60 mEq total) by mouth 2 (two) times daily. 180 tablet 6  . PROAIR HFA 108 (90 Base) MCG/ACT inhaler INHALE 2 PUFFS INTO THE LUNGS EVERY 6 HOURS AS NEEDED (FOR CHEST CONGESTION). 1 Inhaler 11  . senna (SENOKOT) 8.6 MG TABS tablet Take 1 tablet (8.6 mg total) by mouth daily as needed for mild constipation. 120 each 0  . spironolactone (ALDACTONE) 25 MG tablet Take 0.5 tablets (12.5 mg total) by mouth daily. 45 tablet 3  . torsemide (DEMADEX) 20 MG tablet Take 100 mg (5 tabs) in am and 80 mg (4 tabs) in pm 270 tablet 11  . warfarin (COUMADIN) 5 MG tablet Take 10 mg (2 tabs) on 8/5, then 7.5 mg (1.5 tabs) until follow up in coumadin clinic, then take as directed. (Patient taking differently: Take 5-7.5 mg by mouth See admin instructions. Take 7.5 mg by mouth on Monday, Wednesday and Friday. Take 5 mg on Sunday, Tuesday, Thursday and Saturday.) 45 tablet 6   No current facility-administered medications for this encounter.     Allergies  Allergen Reactions  . Rifampin Rash    May have been caused by Vancomycin or Rifampin (??)  . Vancomycin Rash    May have been caused by Vancomycin or Rifampin (??)      Social History   Socioeconomic History  . Marital status: Married    Spouse name: Not on file  . Number of children: 3  . Years of education: Not on file  . Highest education level: Not on file  Occupational History  . Not on file  Social Needs  . Financial resource strain: Not on file  . Food insecurity:    Worry: Not on file    Inability: Not on file  . Transportation needs:    Medical: Not on file    Non-medical: Not on file  Tobacco Use  . Smoking status: Former Smoker    Packs/day: 1.00    Years: 30.00    Pack years: 30.00    Types: Cigarettes    Last attempt to quit: 04/03/1992    Years since quitting: 25.6  .  Smokeless tobacco: Never Used  Substance and Sexual Activity  . Alcohol use: Yes    Comment: 4-6 ounces of wine daily  . Drug use: No    Comment: Half a cup a day.  Marland Kitchen Sexual activity: Not on file  Lifestyle  . Physical activity:    Days per week: Not on file    Minutes per session: Not on file  . Stress: Not on file  Relationships  . Social connections:    Talks on phone: Not on  file    Gets together: Not on file    Attends religious service: Not on file    Active member of club or organization: Not on file    Attends meetings of clubs or organizations: Not on file    Relationship status: Not on file  . Intimate partner violence:    Fear of current or ex partner: Not on file    Emotionally abused: Not on file    Physically abused: Not on file    Forced sexual activity: Not on file  Other Topics Concern  . Not on file  Social History Narrative   Patient is married and lives with his wife.   Youngest daughter lives in [place] also.   Patient has 2 other children.      Family History  Problem Relation Age of Onset  . Cancer Father        lung   . Hypertension Mother     Vitals:   12/05/17 1010  BP: 102/64  Pulse: 83  SpO2: 97%  Weight: 106.7 kg (235 lb 2 oz)   Wt Readings from Last 3 Encounters:  12/05/17 106.7 kg (235 lb 2 oz)  11/16/17 103.3 kg (227 lb 12.8 oz)  11/14/17 102.1 kg (225 lb)    PHYSICAL EXAM: General: Elderly. No resp difficulty. Arrived in wheelchair HEENT: Normal Neck: Supple. JVP 10. Carotids 2+ bilat; no bruits. No thyromegaly or nodule noted. Cor: PMI nondisplaced. IRR, mechanical S1, 2/6 HSM at apex Lungs: CTAB, normal effort. Abdomen: Soft, non-tender, non-distended, no HSM. No bruits or masses. +BS  Extremities: No cyanosis, clubbing, or rash. R and LLE 2+ edema into thighs. BLE unna boots Neuro: Alert & orientedx3, cranial nerves grossly intact. moves all 4 extremities w/o difficulty. Affect pleasant   ASSESSMENT &  PLAN:  1. 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  Severe MR could be contributing to the significant RV failure.   - NYHA class III, not very active.  - Volume status elevated. Weight up 10 lbs.  - Increase torsemide to 100 mg BID. Keep KCl 60 meq BID (Creat 1.26, K 4.5 on 8/30) - Take 2.5 mg metolazone x1 today. BMET in 1 week through [facility].  - Continue spiro 12.5 mg qHS. Will not increase today with soft BP - Continue unna boots through [facility].  - Discussed limiting fluid and salt intake.  2. Mechanical aortic valve: Appears to function well on echo last admission.  - Continue warfarin with INR goal 2.5-3.5 (also with atrial fibrillation).  - Denies bleeding  3. Atrial fibrillation: Chronic.   - Rate controlled. 4. CAD: s/p CABG.  - No s/s ischemia - Continue Vytorin.  5. COPD:  - Continue home oxygen. Continue humidifier. 6. OSA:  - Continue BiPAP qHS 7. CKD: Stage 3.   - Creatinine improved 1.26. BMET next week through [facility]. 8. Anemia:  - Required blood transfusion while inpatient. GI saw. Guaiac negative. Scope not indicated. - Continue Protonix. Denies bleeding. Last hemoglobin 7.9. Check CBC with [facility] labs next week. 9. Thrombocytopenia - Chronic.  10. 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  - Structural Heart team evaluated. Not a Mitraclip candidate. - Has been seen by Dr. [name] who recommended aggressive medical  therapy versus possible experimental percutaneous MVR as he felt very high risk for conventional surgery.  11. Gout:  - Stop colchicine. Start allopurinol 100 mg daily. Discussed dosing with PharmD. Further management by PCP. 12. Dental Extractions -  Coumadin clinic plans to arrange coumadin/lovenox bridge - Pt says oral surgeon will provide antibiotics at pre-op appointment - Encouraged pt to contact us if he needs any assistance  Increase torsemide to 100 mg BID Take 2.5 mg metolazone x1 today Renew [facility] PT BMET, CBC next week through [facility] Stop colchicine. Start allopurinol 100 mg daily. Follow up in 2 weeks  Georgiana Shore, NP 12/05/17  Greater than 50% of the 25 minute visit was spent in counseling/coordination of care regarding disease state education, salt/fluid restriction, sliding scale diuretics, and medication compliance.

## 2017-12-05 ENCOUNTER — Ambulatory Visit (INDEPENDENT_AMBULATORY_CARE_PROVIDER_SITE_OTHER): Payer: Medicare Other | Admitting: *Deleted

## 2017-12-05 ENCOUNTER — Other Ambulatory Visit (HOSPITAL_COMMUNITY): Payer: Self-pay | Admitting: Cardiology

## 2017-12-05 ENCOUNTER — Encounter (HOSPITAL_COMMUNITY): Payer: Self-pay

## 2017-12-05 ENCOUNTER — Other Ambulatory Visit: Payer: Self-pay

## 2017-12-05 ENCOUNTER — Telehealth: Payer: Self-pay | Admitting: *Deleted

## 2017-12-05 ENCOUNTER — Ambulatory Visit (HOSPITAL_COMMUNITY)
Admission: RE | Admit: 2017-12-05 | Discharge: 2017-12-05 | Disposition: A | Discharge status: Home / Self Care | Payer: Medicare Other | Source: Ambulatory Visit | Attending: Cardiology | Admitting: Cardiology

## 2017-12-05 VITALS — BP 102/64 | HR 83 | Wt 235.1 lb

## 2017-12-05 DIAGNOSIS — I482 Chronic atrial fibrillation, unspecified: Secondary | ICD-10-CM

## 2017-12-05 DIAGNOSIS — I130 Hypertensive heart and chronic kidney disease with heart failure and stage 1 through stage 4 chronic kidney disease, or unspecified chronic kidney disease: Secondary | ICD-10-CM | POA: Diagnosis not present

## 2017-12-05 DIAGNOSIS — Z9889 Other specified postprocedural states: Secondary | ICD-10-CM | POA: Insufficient documentation

## 2017-12-05 DIAGNOSIS — Z801 Family history of malignant neoplasm of trachea, bronchus and lung: Secondary | ICD-10-CM | POA: Insufficient documentation

## 2017-12-05 DIAGNOSIS — J449 Chronic obstructive pulmonary disease, unspecified: Secondary | ICD-10-CM | POA: Diagnosis not present

## 2017-12-05 DIAGNOSIS — I2510 Atherosclerotic heart disease of native coronary artery without angina pectoris: Secondary | ICD-10-CM | POA: Insufficient documentation

## 2017-12-05 DIAGNOSIS — Z8249 Family history of ischemic heart disease and other diseases of the circulatory system: Secondary | ICD-10-CM | POA: Diagnosis not present

## 2017-12-05 DIAGNOSIS — Z9981 Dependence on supplemental oxygen: Secondary | ICD-10-CM | POA: Insufficient documentation

## 2017-12-05 DIAGNOSIS — Z5181 Encounter for therapeutic drug level monitoring: Secondary | ICD-10-CM | POA: Diagnosis not present

## 2017-12-05 DIAGNOSIS — M109 Gout, unspecified: Secondary | ICD-10-CM | POA: Diagnosis not present

## 2017-12-05 DIAGNOSIS — D696 Thrombocytopenia, unspecified: Secondary | ICD-10-CM | POA: Insufficient documentation

## 2017-12-05 DIAGNOSIS — Z79899 Other long term (current) drug therapy: Secondary | ICD-10-CM | POA: Diagnosis not present

## 2017-12-05 DIAGNOSIS — I4891 Unspecified atrial fibrillation: Secondary | ICD-10-CM | POA: Diagnosis not present

## 2017-12-05 DIAGNOSIS — D649 Anemia, unspecified: Secondary | ICD-10-CM | POA: Diagnosis not present

## 2017-12-05 DIAGNOSIS — Z881 Allergy status to other antibiotic agents status: Secondary | ICD-10-CM | POA: Diagnosis not present

## 2017-12-05 DIAGNOSIS — Z7984 Long term (current) use of oral hypoglycemic drugs: Secondary | ICD-10-CM | POA: Insufficient documentation

## 2017-12-05 DIAGNOSIS — Z888 Allergy status to other drugs, medicaments and biological substances status: Secondary | ICD-10-CM | POA: Diagnosis not present

## 2017-12-05 DIAGNOSIS — Z952 Presence of prosthetic heart valve: Secondary | ICD-10-CM

## 2017-12-05 DIAGNOSIS — Z7901 Long term (current) use of anticoagulants: Secondary | ICD-10-CM | POA: Diagnosis not present

## 2017-12-05 DIAGNOSIS — N183 Chronic kidney disease, stage 3 unspecified: Secondary | ICD-10-CM

## 2017-12-05 DIAGNOSIS — Z951 Presence of aortocoronary bypass graft: Secondary | ICD-10-CM | POA: Diagnosis not present

## 2017-12-05 DIAGNOSIS — I340 Nonrheumatic mitral (valve) insufficiency: Secondary | ICD-10-CM | POA: Insufficient documentation

## 2017-12-05 DIAGNOSIS — G4733 Obstructive sleep apnea (adult) (pediatric): Secondary | ICD-10-CM

## 2017-12-05 DIAGNOSIS — Z87891 Personal history of nicotine dependence: Secondary | ICD-10-CM | POA: Insufficient documentation

## 2017-12-05 DIAGNOSIS — Z7189 Other specified counseling: Secondary | ICD-10-CM

## 2017-12-05 DIAGNOSIS — I5032 Chronic diastolic (congestive) heart failure: Secondary | ICD-10-CM | POA: Diagnosis not present

## 2017-12-05 DIAGNOSIS — Z7951 Long term (current) use of inhaled steroids: Secondary | ICD-10-CM | POA: Diagnosis not present

## 2017-12-05 LAB — POCT INR: INR: 2.9 (ref 2.0–3.0)

## 2017-12-05 MED ORDER — METOLAZONE 2.5 MG PO TABS: ORAL_TABLET | ORAL | 3 refills | Status: DC

## 2017-12-05 MED ORDER — ENOXAPARIN SODIUM 100 MG/ML ~~LOC~~ SOLN: 100.0000 mg | Freq: Two times a day (BID) | SUBCUTANEOUS | 1 refills | Status: DC

## 2017-12-05 MED ORDER — TORSEMIDE 100 MG PO TABS: 100.0000 mg | ORAL_TABLET | Freq: Two times a day (BID) | ORAL | 3 refills | Status: DC

## 2017-12-05 MED ORDER — ALLOPURINOL 100 MG PO TABS: 100.0000 mg | ORAL_TABLET | Freq: Every day | ORAL | 3 refills | Status: DC

## 2017-12-05 NOTE — Telephone Encounter (Signed)
Patient was in the office today for a coumadin appointment and asked to see the sleep coordinator concerning a new cpap and supplies order he never received a call about. Dr Radford Pax wrote for a ResMed and 30 mask with chinstrap and supplies.  I will also order a ResMed BiPAP and placed on auto with an IPAP max of 18 cm H2O, EPAP min 6 cm H2O, pressure support 4 cm H2O and see her back in 10 weeks with a download on the last appointment date of 10/15/17. I have refaxed this order and the sleep studies today and have notified the patient. Pt is aware and agreeable to treatment.

## 2017-12-05 NOTE — Patient Instructions (Addendum)
12/06/17- Last dose of Coumadin.  12/07/17- No Coumadin or Lovenox.  12/08/17- Inject Lovenox 100mg  in the fatty abdominal tissue at least 2 inches from the belly button twice a day about 12 hours apart, 7am and 7pm,  rotate sites. No Coumadin.  12/09/17- Inject Lovenox in the fatty tissue every 12 hours, 7am and 7pm. No Coumadin.  12/10/17- Inject Lovenox in the fatty tissue every 12 hours, 7am and 7pm. No Coumadin.  12/11/17- Inject Lovenox in the fatty tissue in the morning at 7am (No PM dose). No Coumadin.  12/12/17- Procedure Day - No Lovenox - Resume Coumadin in the evening or as directed by doctor (take an extra half tablet with usual dose for 2 days then resume normal dose).  12/13/17- Resume Lovenox inject in the fatty tissue every 12 hours and take Coumadin.  12/14/17- Inject Lovenox in the fatty tissue every 12 hours and take Coumadin.  12/15/17- Inject Lovenox in the fatty tissue every 12 hours and take Coumadin.  12/16/17- Inject Lovenox in the fatty tissue every 12 hours and take Coumadin.  12/17/17-Inject Lovenox in the fatty tissue every 12 hours and take Coumadin.  12/18/17- Coumadin appt to check INR.

## 2017-12-05 NOTE — Telephone Encounter (Signed)
Patient was in the office today for a coumadin appointment and asked to see the sleep coordinator concerning a new cpap and supplies order he never received a call about. Dr Radford Pax wrote for a ResMed and 30 mask with chinstrap and supplies. I will also order a ResMed BiPAP and placed on auto with an IPAP max of 18 cm H2O, EPAP min 6 cm H2O, pressure support 4 cm H2O and see her back in 10 weeks with a download on the last appointment date of 10/15/17. I have refaxed this order and the sleep studies today and have notified the patient. Pt is aware and agreeable to treatment.

## 2017-12-05 NOTE — Patient Instructions (Addendum)
INCREASE Torsemide to 100mg  twice daily.  STOP Colchicine.  START Allopurinol 100mg  daily.  Take Metolazone 2.5mg  1 tablet TODAY ONLY.   Follow up with in  2 weeks.   Order faxed to [facility] to continue physical therapy and draw bmet and cbc in 1 week.

## 2017-12-05 NOTE — Addendum Note (Signed)
Addended by: Freada Bergeron on: 12/05/2017 03:16 PM   Modules accepted: Orders

## 2017-12-06 DIAGNOSIS — N183 Chronic kidney disease, stage 3 (moderate): Secondary | ICD-10-CM | POA: Diagnosis not present

## 2017-12-06 DIAGNOSIS — E1122 Type 2 diabetes mellitus with diabetic chronic kidney disease: Secondary | ICD-10-CM | POA: Diagnosis not present

## 2017-12-06 DIAGNOSIS — D631 Anemia in chronic kidney disease: Secondary | ICD-10-CM | POA: Diagnosis not present

## 2017-12-06 DIAGNOSIS — I130 Hypertensive heart and chronic kidney disease with heart failure and stage 1 through stage 4 chronic kidney disease, or unspecified chronic kidney disease: Secondary | ICD-10-CM | POA: Diagnosis not present

## 2017-12-06 DIAGNOSIS — I2510 Atherosclerotic heart disease of native coronary artery without angina pectoris: Secondary | ICD-10-CM | POA: Diagnosis not present

## 2017-12-06 DIAGNOSIS — I5033 Acute on chronic diastolic (congestive) heart failure: Secondary | ICD-10-CM | POA: Diagnosis not present

## 2017-12-06 NOTE — Pre-Procedure Instructions (Signed)
Ollis Daudelin  12/06/2017      [id number] [place], [place] [phone] [address] [place] [address] Phone: [phone] Fax: [place], [place] - [address] [facility]. [address] [place] [address] Phone: [phone] Fax: [phone]    Your procedure is scheduled on December 12, 2017  Report to [facility] Admitting at 630 AM.  Call this number if you have problems the morning of surgery:  [phone]   Remember:  Do not eat or drink after midnight.    Take these medicines the morning of surgery with A SIP OF WATER  Proair inhaler-if needed-bring inhaler with you Pantoprazole (protonix) Ipratropium-albuterol nebulizer-if needed Allopurinol (zyloprim)  Follow your surgeon's instructions on when to hold/resume Lovenox and coumadin.  If no instructions were given call the office to determine how they would like to you take Lovenox and coumadin  7 days prior to surgery STOP taking any Aspirin(unless otherwise instructed by your surgeon), Aleve, Naproxen, Ibuprofen, Motrin, Advil, Goody's, BC's, all herbal medications, fish oil, and all vitamins  WHAT DO I DO ABOUT MY DIABETES MEDICATION?   Marland Kitchen Do not take oral diabetes medicines (pills) the morning of surgery-metformin (glucophage).   Reviewed and Endorsed by [facility] Patient Education Committee, August 2015  How to Manage Your Diabetes Before and After Surgery  Why is it important to control my blood sugar before and after surgery? . Improving blood sugar levels before and after surgery helps healing and can limit problems. . A way of improving blood sugar control is eating a healthy diet by: o  Eating less sugar and carbohydrates o  Increasing activity/exercise o  Talking with your doctor about reaching your blood sugar goals . High blood sugars (greater than 180 mg/dL) can raise your  risk of infections and slow your recovery, so you will need to focus on controlling your diabetes during the weeks before surgery. . Make sure that the doctor who takes care of your diabetes knows about your planned surgery including the date and location.  How do I manage my blood sugar before surgery? . Check your blood sugar at least 4 times a day, starting 2 days before surgery, to make sure that the level is not too high or low. o Check your blood sugar the morning of your surgery when you wake up and every 2 hours until you get to the Short Stay unit. . If your blood sugar is less than 70 mg/dL, you will need to treat for low blood sugar: o Do not take insulin. o Treat a low blood sugar (less than 70 mg/dL) with  cup of clear juice (cranberry or apple), 4 glucose tablets, OR glucose gel. Recheck blood sugar in 15 minutes after treatment (to make sure it is greater than 70 mg/dL). If your blood sugar is not greater than 70 mg/dL on recheck, call [phone] o  for further instructions. . Report your blood sugar to the short stay nurse when you get to Short Stay.  . If you are admitted to the hospital after surgery: o Your blood sugar will be checked by the staff and you will probably be given insulin after surgery (instead of oral diabetes medicines) to make sure you have good blood sugar levels. o The goal for blood sugar control after surgery is 80-180 mg/dL.  Contacts, dentures or bridgework may not be worn into  surgery.  Leave your suitcase in the car.  After surgery it may be brought to your room.  For patients admitted to the hospital, discharge time will be determined by your treatment team.  Patients discharged the day of surgery will not be allowed to drive home.    - Preparing For Surgery  Before surgery, you can play an important role. Because skin is not sterile, your skin needs to be as free of germs as possible. You can reduce the number of germs on your skin  by washing with CHG (chlorahexidine gluconate) Soap before surgery.  CHG is an antiseptic cleaner which kills germs and bonds with the skin to continue killing germs even after washing.    Oral Hygiene is also important to reduce your risk of infection.  Remember - BRUSH YOUR TEETH THE MORNING OF SURGERY WITH YOUR REGULAR TOOTHPASTE  Please do not use if you have an allergy to CHG or antibacterial soaps. If your skin becomes reddened/irritated stop using the CHG.  Do not shave (including legs and underarms) for at least 48 hours prior to first CHG shower. It is OK to shave your face.  Please follow these instructions carefully.   1. Shower the NIGHT BEFORE SURGERY and the MORNING OF SURGERY with CHG.   2. If you chose to wash your hair, wash your hair first as usual with your normal shampoo.  3. After you shampoo, rinse your hair and body thoroughly to remove the shampoo.  4. Use CHG as you would any other liquid soap. You can apply CHG directly to the skin and wash gently with a scrungie or a clean washcloth.   5. Apply the CHG Soap to your body ONLY FROM THE NECK DOWN.  Do not use on open wounds or open sores. Avoid contact with your eyes, ears, mouth and genitals (private parts). Wash Face and genitals (private parts)  with your normal soap.  6. Wash thoroughly, paying special attention to the area where your surgery will be performed.  7. Thoroughly rinse your body with warm water from the neck down.  8. DO NOT shower/wash with your normal soap after using and rinsing off the CHG Soap.  9. Pat yourself dry with a CLEAN TOWEL.  10. Wear CLEAN PAJAMAS to bed the night before surgery, wear comfortable clothes the morning of surgery  11. Place CLEAN SHEETS on your bed the night of your first shower and DO NOT SLEEP WITH PETS.   Day of Surgery:  Do not apply any deodorants/lotions.  Please wear clean clothes to the hospital/surgery center.   Remember to brush your teeth WITH YOUR  REGULAR TOOTHPASTE.   Do not wear jewelry, make-up or nail polish.  Do not wear lotions, powders, or perfumes, or deodorant.  Do not shave 48 hours prior to surgery.  Men may shave face and neck.  Do not bring valuables to the hospital.   [facility] is not responsible for any belongings or valuables.  Please read over the fact sheets that you were given.

## 2017-12-07 ENCOUNTER — Encounter (HOSPITAL_COMMUNITY): Payer: Self-pay

## 2017-12-07 ENCOUNTER — Other Ambulatory Visit: Payer: Self-pay

## 2017-12-07 ENCOUNTER — Encounter (HOSPITAL_COMMUNITY)
Admission: RE | Admit: 2017-12-07 | Discharge: 2017-12-07 | Disposition: A | Discharge status: Home / Self Care | Payer: Medicare Other | Source: Ambulatory Visit | Attending: Dentistry | Admitting: Dentistry

## 2017-12-07 DIAGNOSIS — I340 Nonrheumatic mitral (valve) insufficiency: Secondary | ICD-10-CM | POA: Insufficient documentation

## 2017-12-07 DIAGNOSIS — Z79899 Other long term (current) drug therapy: Secondary | ICD-10-CM | POA: Diagnosis not present

## 2017-12-07 DIAGNOSIS — N183 Chronic kidney disease, stage 3 (moderate): Secondary | ICD-10-CM | POA: Insufficient documentation

## 2017-12-07 DIAGNOSIS — I5032 Chronic diastolic (congestive) heart failure: Secondary | ICD-10-CM | POA: Diagnosis not present

## 2017-12-07 DIAGNOSIS — Z01818 Encounter for other preprocedural examination: Secondary | ICD-10-CM | POA: Insufficient documentation

## 2017-12-07 DIAGNOSIS — Z951 Presence of aortocoronary bypass graft: Secondary | ICD-10-CM | POA: Insufficient documentation

## 2017-12-07 DIAGNOSIS — G4733 Obstructive sleep apnea (adult) (pediatric): Secondary | ICD-10-CM | POA: Insufficient documentation

## 2017-12-07 DIAGNOSIS — Z7901 Long term (current) use of anticoagulants: Secondary | ICD-10-CM | POA: Insufficient documentation

## 2017-12-07 DIAGNOSIS — M109 Gout, unspecified: Secondary | ICD-10-CM | POA: Diagnosis not present

## 2017-12-07 DIAGNOSIS — K0530 Chronic periodontitis, unspecified: Secondary | ICD-10-CM | POA: Diagnosis not present

## 2017-12-07 DIAGNOSIS — I481 Persistent atrial fibrillation: Secondary | ICD-10-CM | POA: Insufficient documentation

## 2017-12-07 DIAGNOSIS — D649 Anemia, unspecified: Secondary | ICD-10-CM | POA: Insufficient documentation

## 2017-12-07 DIAGNOSIS — I130 Hypertensive heart and chronic kidney disease with heart failure and stage 1 through stage 4 chronic kidney disease, or unspecified chronic kidney disease: Secondary | ICD-10-CM | POA: Diagnosis not present

## 2017-12-07 DIAGNOSIS — E1122 Type 2 diabetes mellitus with diabetic chronic kidney disease: Secondary | ICD-10-CM | POA: Diagnosis not present

## 2017-12-07 DIAGNOSIS — Z952 Presence of prosthetic heart valve: Secondary | ICD-10-CM | POA: Insufficient documentation

## 2017-12-07 DIAGNOSIS — Z7984 Long term (current) use of oral hypoglycemic drugs: Secondary | ICD-10-CM | POA: Diagnosis not present

## 2017-12-07 HISTORY — DX: Cardiac murmur, unspecified: R01.1

## 2017-12-07 HISTORY — DX: Dyspnea, unspecified: R06.00

## 2017-12-07 HISTORY — DX: Type 2 diabetes mellitus without complications: E11.9

## 2017-12-07 HISTORY — DX: Thrombocytopenia, unspecified: D69.6

## 2017-12-07 LAB — COMPREHENSIVE METABOLIC PANEL
ALT: 11 U/L (ref 0–44)
AST: 21 U/L (ref 15–41)
Albumin: 3.7 g/dL (ref 3.5–5.0)
Alkaline Phosphatase: 118 U/L (ref 38–126)
Anion gap: 14 (ref 5–15)
BUN: 31 mg/dL — AB (ref 8–23)
CO2: 31 mmol/L (ref 22–32)
CREATININE: 1.44 mg/dL — AB (ref 0.61–1.24)
Calcium: 9.8 mg/dL (ref 8.9–10.3)
Chloride: 93 mmol/L — ABNORMAL LOW (ref 98–111)
GFR calc Af Amer: 51 mL/min — ABNORMAL LOW (ref >60)
GFR calc non Af Amer: 44 mL/min — ABNORMAL LOW (ref >60)
GLUCOSE: 99 mg/dL (ref 70–99)
POTASSIUM: 4.2 mmol/L (ref 3.5–5.1)
Sodium: 138 mmol/L (ref 135–145)
Total Bilirubin: 1.4 mg/dL — ABNORMAL HIGH (ref 0.3–1.2)
Total Protein: 7.4 g/dL (ref 6.5–8.1)

## 2017-12-07 LAB — CBC
HCT: 29 % — ABNORMAL LOW (ref 39.0–52.0)
Hemoglobin: 8.7 g/dL — ABNORMAL LOW (ref 13.0–17.0)
MCH: 31.5 pg (ref 26.0–34.0)
MCHC: 30 g/dL (ref 30.0–36.0)
MCV: 105.1 fL — AB (ref 78.0–100.0)
PLATELETS: 128 10*3/uL — AB (ref 150–400)
RBC: 2.76 MIL/uL — ABNORMAL LOW (ref 4.22–5.81)
RDW: 18 % — AB (ref 11.5–15.5)
WBC: 3 10*3/uL — AB (ref 4.0–10.5)

## 2017-12-07 LAB — HEMOGLOBIN A1C
HEMOGLOBIN A1C: 4.7 % — AB (ref 4.8–5.6)
MEAN PLASMA GLUCOSE: 88.19 mg/dL

## 2017-12-07 LAB — GLUCOSE, CAPILLARY: GLUCOSE-CAPILLARY: 124 mg/dL — AB (ref 70–99)

## 2017-12-07 NOTE — Progress Notes (Signed)
Anesthesia Chart Review:  Case:  [id number] Date/Time:  12/12/17 0815   Procedure:  MULTIPLE EXTRACTION WITH ALVEOLOPLASTY WITH PRE PROSTHETIC SURGERY AND GROSS DEBRIDEMENT OF REMAINING TEETH (Bilateral )   Anesthesia type:  General   Pre-op diagnosis:  SEVERE MITRAL REGURGITATION AND CHRONIC PERIODONTITIS   Location:  [facility] OR ROOM 18 / [place] OR   Surgeon:  Lenn Cal, DDS      DISCUSSION: 82 yo male former smoker for above procedure. 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  Recently hospitalized 7/18-11/02/2017 for acute on chronic diastolic CHF. He diuresed 83 lbs with lasix drip, metolazone, and diamox. Determined not to be a good candidate for MitraClip. Dr [name] also consulted and recommended aggressive medical therapy versus possible experimental percutaneous MVR as he felt very high risk for conventional surgery. Co urse complicated by drop in hemoglobin and symptomatic hypotension, requiring 1 unit pRBCs.  Echo during admission showed EF 55-65%, mechanical aortic valve functioning normally, mild mitral stenosis/severe mitral regurgitation, RV moderately dilated with moderately decreased systolic function, PASP 49 mmHg. Severe MR could be contributing to the significant RV failure.   I saw pt at PAT appointment to discuss if any contraindication to nasotracheal intubation. He denied ever having broken nose, nasal surgery, or any difficulty breathing through his nose. He did report that over the past 6 months he has had several severe nosebleeeds from the left nares requiring ED or UC treatment. He denies any bleeding from right nares.  Last dose of coumadin 9/6, will be starting lovenox injections 9/7.  Pt medically complex with recent decompensated HF  requiring prolonged hospitalization. He is now seeing HF clinic every 2 weeks and appears markedly improved but still has significant bilateral LE edema. He will need DOS eval by assigned anesthesiologist. Anticipate he can proceed with surgery as planned barring acute status change.  VS: BP 109/63   Pulse (!) 108   Temp 36.7 C (Oral)   Ht 6\' 2"  (1.88 m)   Wt 105.9 kg   SpO2 94%   BMI 29.97 kg/m    PROVIDERS: [name], [name], MD is PCP  Daneen Schick, MD is Cardiologist last seen 11/16/2017  Loralie Champagne, MD is HF cardiologist, last seen in office 12/05/2017 by Lillia Mountain, NP  LABS: Chronic anemia, current Hgb 8.7 is up from 7.9 three weeks ago. Per Epic review Dr. Lawana Chambers aware.    Ref. Range 10/29/2017 05:00 10/30/2017 05:27 10/31/2017 03:46 11/01/2017 04:04 11/02/2017 05:37 11/03/2017 05:00 11/03/2017 06:43 11/04/2017 04:24 11/05/2017 04:33 11/14/2017 09:14 12/07/2017 10:37  Hemoglobin Latest Ref Range: 13.0 - 17.0 g/dL 8.1 (L) 7.9 (L) 8.3 (L) 10.4 (L) 8.0 (L) 6.3 (LL) 7.5 (L) 8.9 (L) 9.3 (L) 7.9 (L) 8.7 (L)    (all labs ordered are listed, but only abnormal results are displayed)  Labs Reviewed  GLUCOSE, CAPILLARY - Abnormal; Notable for the following components:      Result Value   Glucose-Capillary 124 (*)    All other components within normal limits  CBC - Abnormal; Notable for the following components:   WBC 3.0 (*)    RBC 2.76 (*)    Hemoglobin 8.7 (*)    HCT 29.0 (*)    MCV 105.1 (*)    RDW 18.0 (*)  Platelets 128 (*)    All other components within normal limits  HEMOGLOBIN A1C - Abnormal; Notable for the following components:   Hgb A1c MFr Bld 4.7 (*)    All other components within normal limits  COMPREHENSIVE METABOLIC PANEL - Abnormal; Notable for the following components:   Chloride 93 (*)    BUN 31 (*)    Creatinine, Ser 1.44 (*)    Total Bilirubin 1.4 (*)    GFR calc non Af Amer 44 (*)    GFR calc Af Amer 51 (*)    All other components within normal limits      IMAGES: PORTABLE CHEST 1 VIEW 10/26/2017  COMPARISON:  10/18/2017, 08/01/2017  FINDINGS: Post sternotomy changes. Right upper extremity catheter tip projects over the SVC. Cardiomegaly with vascular congestion. Small left pleural effusion with left basilar airspace disease. Aortic atherosclerosis.  IMPRESSION: 1. Right upper extremity catheter tip overlies the SVC. 2. Cardiomegaly with small left pleural effusion and left basilar airspace disease.   EKG: 10/18/2017: Atrial fibrillation with premature ventricular or aberrantly conducted complexes Low voltage QRS. ST & T wave abnormality, consider anterolateral ischemia. Previous tracing with borderline repol abnormality.   CV: TEE 10/23/17: - Left ventricle: The cavity size was normal. Wall thickness was increased in a pattern of mild LVH. Systolic function was normal. The estimated ejection fraction was in the range of 55% to 60%. Wall motion was normal; there were no regional wall motion abnormalities. D-shaped septum consistent with RV pressure/volume overload. - Aortic valve: Mechanical aortic valve with no significant regurgitation, unable to obtain mean gradient as transgastric windows were poor. - Aorta: Normal caliber ascending aorta. - Mitral valve: The mitral valve was calcified and thickened, especially the posterior leaflet that was restricted. Mean gradient 5 mmHg but MVA by PHT >2 cm^2. I think there was only minimal mitral stenosis. There was severe mitral regurgitation, likely due to restriction of posterior leaflet and annular dilation from severe LAE (mixed functional and primary etiology). ERO 0.42 cm^2 by PISA. - Left atrium: The atrium was severely dilated. No evidence of thrombus in the atrial cavity or appendage. - Pulmonary veins: There was systolic flow reversal in the pulmonary vein doppler pattern. - Right ventricle: The cavity size was mildly dilated.  Systolic function was mildly reduced. - Right atrium: The atrium was moderately dilated. - Atrial septum: No defect or patent foramen ovale was identified. - Tricuspid valve: Mild tricuspid regurgitation with peak RV-RA gradient 25 mmHg.  Past Medical History:  Diagnosis Date  . Carotid artery disease ([place]) 1994   s/p left carotid endarerectomy   . Chronic atrial fibrillation (HCC)    a. on coumadin   . Chronic diastolic CHF (congestive heart failure) ([place])   . Diabetes mellitus without complication ([place])    dx 2016  . Dyspnea   . Heart murmur   . Hx of CABG    a. 1994  . Hypertension   . OSA (obstructive sleep apnea)   . Rheumatic fever   . S/P AVR (aortic valve replacement)    a. mechanical valve 1996  . Subclavian bypass stenosis ([place])   . 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 Vitamin B12 deficiency     Past Surgical History:  Procedure Laterality Date  . [place]   replaced due to aortic stenosis, [employer] mechanical prostesis  . CAROTID ENDARTERECTOMY Left 1997   subclavian bypass Done in  [place]  . CORONARY ARTERY BYPASS GRAFT  1994   w SVG to RCA and SVG to circumflex  . heart bypass     Done in [place]  . TEE WITHOUT CARDIOVERSION N/A 10/23/2017   Procedure: TRANSESOPHAGEAL ECHOCARDIOGRAM (TEE);  Surgeon: Larey Dresser, MD;  Location: [facility] ENDOSCOPY;  Service: Cardiovascular;  Laterality: N/A;  . TONSILLECTOMY      MEDICATIONS: . allopurinol (ZYLOPRIM) 100 MG tablet  . Cholecalciferol (VITAMIN D3) 2000 units capsule  . Cyanocobalamin (VITAMIN B-12) 3000 MCG SUBL  . Dietary Management Product (VASCULERA) TABS  . docusate sodium (COLACE) 100 MG capsule  . enoxaparin (LOVENOX) 100 MG/ML injection  . ezetimibe-simvastatin (VYTORIN) 10-20 MG tablet  . folic acid (FOLVITE) 1 MG tablet  . glucose blood (ONE TOUCH ULTRA TEST) test strip  . glucose blood (ONETOUCH VERIO) test  strip  . hydrocortisone cream 1 %  . ipratropium-albuterol (DUONEB) 0.5-2.5 (3) MG/3ML SOLN  . metFORMIN (GLUCOPHAGE-XR) 750 MG 24 hr tablet  . metolazone (ZAROXOLYN) 2.5 MG tablet  . NON FORMULARY  . ONETOUCH DELICA LANCETS FINE MISC  . pantoprazole (PROTONIX) 40 MG tablet  . polyethylene glycol (MIRALAX / GLYCOLAX) packet  . potassium chloride SA (K-DUR,KLOR-CON) 20 MEQ tablet  . PROAIR HFA 108 (90 Base) MCG/ACT inhaler  . senna (SENOKOT) 8.6 MG TABS tablet  . spironolactone (ALDACTONE) 25 MG tablet  . torsemide (DEMADEX) 100 MG tablet  . warfarin (COUMADIN) 5 MG tablet   No current facility-administered medications for this encounter.     Wynonia Musty [facility] Short Stay Center/Anesthesiology Phone [phone] 12/10/2017 10:29 AM

## 2017-12-07 NOTE — Progress Notes (Signed)
Cardio Dr. Linard Millers  LOV 11/2017 Ekg 10/2017, echo 10/2017 Admitted in July for [place] to heart clinic every 2 weeks since his recent admission Chronic a-fib, and low platelet count ('side effect' from aortic valve replacement, done in 1994) Mitral valve is beginning to weaken Has nosebleeds, started in May or June 2019, more so from left nares.(had nose packing in ER) PCP is Dr. Alain Marion  LOV 10/2017 Endocrinologist Dr. Dwyane Dee  LOV  08/2017 Hematologist Dr. Reynolds Bowl  LOV 09/2017 Last dose of coumadin 9/6, will be starting lovenox injections 9/7 Does use Bipap machine, doesn't know settings He checks his sugars when "I feel like it"   Ranges from 60 - 124   Last A1C mths ago.

## 2017-12-11 DIAGNOSIS — D631 Anemia in chronic kidney disease: Secondary | ICD-10-CM | POA: Diagnosis not present

## 2017-12-11 DIAGNOSIS — I5033 Acute on chronic diastolic (congestive) heart failure: Secondary | ICD-10-CM | POA: Diagnosis not present

## 2017-12-11 DIAGNOSIS — I2510 Atherosclerotic heart disease of native coronary artery without angina pectoris: Secondary | ICD-10-CM | POA: Diagnosis not present

## 2017-12-11 DIAGNOSIS — I130 Hypertensive heart and chronic kidney disease with heart failure and stage 1 through stage 4 chronic kidney disease, or unspecified chronic kidney disease: Secondary | ICD-10-CM | POA: Diagnosis not present

## 2017-12-11 DIAGNOSIS — E1122 Type 2 diabetes mellitus with diabetic chronic kidney disease: Secondary | ICD-10-CM | POA: Diagnosis not present

## 2017-12-11 DIAGNOSIS — N183 Chronic kidney disease, stage 3 (moderate): Secondary | ICD-10-CM | POA: Diagnosis not present

## 2017-12-12 ENCOUNTER — Inpatient Hospital Stay (HOSPITAL_COMMUNITY)
Admission: RE | Admit: 2017-12-12 | Discharge: 2017-12-14 | DRG: 129 | Disposition: A | Discharge status: Home Health | Payer: Medicare Other | Source: Ambulatory Visit | Attending: Cardiology | Admitting: Cardiology

## 2017-12-12 ENCOUNTER — Ambulatory Visit (HOSPITAL_COMMUNITY): Payer: Medicare Other | Admitting: Certified Registered Nurse Anesthetist

## 2017-12-12 ENCOUNTER — Other Ambulatory Visit (HOSPITAL_COMMUNITY): Payer: Self-pay | Admitting: Cardiology

## 2017-12-12 ENCOUNTER — Other Ambulatory Visit: Payer: Self-pay

## 2017-12-12 ENCOUNTER — Encounter (HOSPITAL_COMMUNITY): Payer: Self-pay | Admitting: *Deleted

## 2017-12-12 ENCOUNTER — Ambulatory Visit (HOSPITAL_COMMUNITY): Payer: Medicare Other | Admitting: Physician Assistant

## 2017-12-12 ENCOUNTER — Encounter (HOSPITAL_COMMUNITY)
Admission: RE | Disposition: A | Discharge status: Home Health | Payer: Self-pay | Source: Ambulatory Visit | Attending: Cardiology

## 2017-12-12 DIAGNOSIS — E1151 Type 2 diabetes mellitus with diabetic peripheral angiopathy without gangrene: Secondary | ICD-10-CM | POA: Diagnosis not present

## 2017-12-12 DIAGNOSIS — I080 Rheumatic disorders of both mitral and aortic valves: Secondary | ICD-10-CM | POA: Diagnosis present

## 2017-12-12 DIAGNOSIS — M109 Gout, unspecified: Secondary | ICD-10-CM | POA: Diagnosis present

## 2017-12-12 DIAGNOSIS — I4891 Unspecified atrial fibrillation: Secondary | ICD-10-CM | POA: Diagnosis present

## 2017-12-12 DIAGNOSIS — J9611 Chronic respiratory failure with hypoxia: Secondary | ICD-10-CM | POA: Diagnosis not present

## 2017-12-12 DIAGNOSIS — R609 Edema, unspecified: Secondary | ICD-10-CM | POA: Diagnosis present

## 2017-12-12 DIAGNOSIS — Z79899 Other long term (current) drug therapy: Secondary | ICD-10-CM

## 2017-12-12 DIAGNOSIS — I6529 Occlusion and stenosis of unspecified carotid artery: Secondary | ICD-10-CM | POA: Diagnosis present

## 2017-12-12 DIAGNOSIS — I471 Supraventricular tachycardia: Secondary | ICD-10-CM | POA: Diagnosis not present

## 2017-12-12 DIAGNOSIS — I130 Hypertensive heart and chronic kidney disease with heart failure and stage 1 through stage 4 chronic kidney disease, or unspecified chronic kidney disease: Secondary | ICD-10-CM | POA: Diagnosis present

## 2017-12-12 DIAGNOSIS — Z9981 Dependence on supplemental oxygen: Secondary | ICD-10-CM

## 2017-12-12 DIAGNOSIS — J449 Chronic obstructive pulmonary disease, unspecified: Secondary | ICD-10-CM | POA: Diagnosis present

## 2017-12-12 DIAGNOSIS — I2510 Atherosclerotic heart disease of native coronary artery without angina pectoris: Secondary | ICD-10-CM | POA: Diagnosis present

## 2017-12-12 DIAGNOSIS — E1122 Type 2 diabetes mellitus with diabetic chronic kidney disease: Secondary | ICD-10-CM | POA: Diagnosis not present

## 2017-12-12 DIAGNOSIS — K045 Chronic apical periodontitis: Secondary | ICD-10-CM | POA: Diagnosis not present

## 2017-12-12 DIAGNOSIS — Z01818 Encounter for other preprocedural examination: Secondary | ICD-10-CM

## 2017-12-12 DIAGNOSIS — Z9989 Dependence on other enabling machines and devices: Secondary | ICD-10-CM

## 2017-12-12 DIAGNOSIS — Z7901 Long term (current) use of anticoagulants: Secondary | ICD-10-CM

## 2017-12-12 DIAGNOSIS — E785 Hyperlipidemia, unspecified: Secondary | ICD-10-CM | POA: Diagnosis present

## 2017-12-12 DIAGNOSIS — E538 Deficiency of other specified B group vitamins: Secondary | ICD-10-CM | POA: Diagnosis present

## 2017-12-12 DIAGNOSIS — I5043 Acute on chronic combined systolic (congestive) and diastolic (congestive) heart failure: Secondary | ICD-10-CM

## 2017-12-12 DIAGNOSIS — K0530 Chronic periodontitis, unspecified: Secondary | ICD-10-CM | POA: Diagnosis not present

## 2017-12-12 DIAGNOSIS — D696 Thrombocytopenia, unspecified: Secondary | ICD-10-CM | POA: Diagnosis not present

## 2017-12-12 DIAGNOSIS — Z952 Presence of prosthetic heart valve: Secondary | ICD-10-CM

## 2017-12-12 DIAGNOSIS — G4733 Obstructive sleep apnea (adult) (pediatric): Secondary | ICD-10-CM | POA: Diagnosis present

## 2017-12-12 DIAGNOSIS — I340 Nonrheumatic mitral (valve) insufficiency: Secondary | ICD-10-CM | POA: Diagnosis present

## 2017-12-12 DIAGNOSIS — D62 Acute posthemorrhagic anemia: Secondary | ICD-10-CM | POA: Diagnosis not present

## 2017-12-12 DIAGNOSIS — I5032 Chronic diastolic (congestive) heart failure: Secondary | ICD-10-CM | POA: Diagnosis present

## 2017-12-12 DIAGNOSIS — Z7984 Long term (current) use of oral hypoglycemic drugs: Secondary | ICD-10-CM

## 2017-12-12 DIAGNOSIS — K029 Dental caries, unspecified: Secondary | ICD-10-CM

## 2017-12-12 DIAGNOSIS — I5033 Acute on chronic diastolic (congestive) heart failure: Secondary | ICD-10-CM | POA: Diagnosis present

## 2017-12-12 DIAGNOSIS — I482 Chronic atrial fibrillation: Secondary | ICD-10-CM

## 2017-12-12 DIAGNOSIS — J441 Chronic obstructive pulmonary disease with (acute) exacerbation: Secondary | ICD-10-CM | POA: Diagnosis present

## 2017-12-12 DIAGNOSIS — Z8619 Personal history of other infectious and parasitic diseases: Secondary | ICD-10-CM

## 2017-12-12 DIAGNOSIS — K0889 Other specified disorders of teeth and supporting structures: Secondary | ICD-10-CM | POA: Diagnosis present

## 2017-12-12 DIAGNOSIS — Z951 Presence of aortocoronary bypass graft: Secondary | ICD-10-CM

## 2017-12-12 DIAGNOSIS — G8918 Other acute postprocedural pain: Secondary | ICD-10-CM | POA: Diagnosis present

## 2017-12-12 DIAGNOSIS — E876 Hypokalemia: Secondary | ICD-10-CM | POA: Diagnosis present

## 2017-12-12 DIAGNOSIS — I10 Essential (primary) hypertension: Secondary | ICD-10-CM | POA: Diagnosis present

## 2017-12-12 DIAGNOSIS — I110 Hypertensive heart disease with heart failure: Secondary | ICD-10-CM | POA: Diagnosis not present

## 2017-12-12 DIAGNOSIS — N183 Chronic kidney disease, stage 3 (moderate): Secondary | ICD-10-CM | POA: Diagnosis present

## 2017-12-12 DIAGNOSIS — Z881 Allergy status to other antibiotic agents status: Secondary | ICD-10-CM

## 2017-12-12 DIAGNOSIS — R600 Localized edema: Secondary | ICD-10-CM | POA: Diagnosis present

## 2017-12-12 DIAGNOSIS — E118 Type 2 diabetes mellitus with unspecified complications: Secondary | ICD-10-CM | POA: Diagnosis present

## 2017-12-12 DIAGNOSIS — D631 Anemia in chronic kidney disease: Secondary | ICD-10-CM | POA: Diagnosis present

## 2017-12-12 DIAGNOSIS — Z87891 Personal history of nicotine dependence: Secondary | ICD-10-CM

## 2017-12-12 HISTORY — PX: MULTIPLE EXTRACTIONS WITH ALVEOLOPLASTY: SHX5342

## 2017-12-12 LAB — CBC WITH DIFFERENTIAL/PLATELET
BAND NEUTROPHILS: 0 %
BASOS PCT: 0 %
Basophils Absolute: 0 10*3/uL (ref 0.0–0.1)
Blasts: 0 %
EOS ABS: 0.1 10*3/uL (ref 0.0–0.7)
EOS PCT: 2 %
HCT: 25.1 % — ABNORMAL LOW (ref 39.0–52.0)
Hemoglobin: 7.5 g/dL — ABNORMAL LOW (ref 13.0–17.0)
LYMPHS ABS: 0.5 10*3/uL — AB (ref 0.7–4.0)
LYMPHS PCT: 7 %
MCH: 31.9 pg (ref 26.0–34.0)
MCHC: 29.9 g/dL — ABNORMAL LOW (ref 30.0–36.0)
MCV: 106.8 fL — AB (ref 78.0–100.0)
MONO ABS: 0.4 10*3/uL (ref 0.1–1.0)
MYELOCYTES: 0 %
Metamyelocytes Relative: 0 %
Monocytes Relative: 6 %
Neutro Abs: 6.4 10*3/uL (ref 1.7–7.7)
Neutrophils Relative %: 85 %
OTHER: 0 %
PLATELETS: 124 10*3/uL — AB (ref 150–400)
Promyelocytes Relative: 0 %
RBC: 2.35 MIL/uL — ABNORMAL LOW (ref 4.22–5.81)
RDW: 17.9 % — AB (ref 11.5–15.5)
WBC: 7.4 10*3/uL (ref 4.0–10.5)
nRBC: 0 /100 WBC

## 2017-12-12 LAB — COMPREHENSIVE METABOLIC PANEL
ALT: 10 U/L (ref 0–44)
ANION GAP: 14 (ref 5–15)
AST: 22 U/L (ref 15–41)
Albumin: 3.4 g/dL — ABNORMAL LOW (ref 3.5–5.0)
Alkaline Phosphatase: 100 U/L (ref 38–126)
BUN: 34 mg/dL — ABNORMAL HIGH (ref 8–23)
CO2: 28 mmol/L (ref 22–32)
CREATININE: 1.57 mg/dL — AB (ref 0.61–1.24)
Calcium: 9.2 mg/dL (ref 8.9–10.3)
Chloride: 96 mmol/L — ABNORMAL LOW (ref 98–111)
GFR, EST AFRICAN AMERICAN: 46 mL/min — AB (ref >60)
GFR, EST NON AFRICAN AMERICAN: 40 mL/min — AB (ref >60)
Glucose, Bld: 172 mg/dL — ABNORMAL HIGH (ref 70–99)
POTASSIUM: 4.5 mmol/L (ref 3.5–5.1)
SODIUM: 138 mmol/L (ref 135–145)
Total Bilirubin: 1.4 mg/dL — ABNORMAL HIGH (ref 0.3–1.2)
Total Protein: 6.7 g/dL (ref 6.5–8.1)

## 2017-12-12 LAB — PROTIME-INR
INR: 1.2
Prothrombin Time: 15.1 seconds (ref 11.4–15.2)

## 2017-12-12 LAB — GLUCOSE, CAPILLARY
GLUCOSE-CAPILLARY: 114 mg/dL — AB (ref 70–99)
Glucose-Capillary: 127 mg/dL — ABNORMAL HIGH (ref 70–99)
Glucose-Capillary: 167 mg/dL — ABNORMAL HIGH (ref 70–99)
Glucose-Capillary: 157 mg/dL — ABNORMAL HIGH (ref 70–99)

## 2017-12-12 SURGERY — MULTIPLE EXTRACTION WITH ALVEOLOPLASTY
Anesthesia: General | Site: Mouth | Laterality: Bilateral

## 2017-12-12 MED ORDER — FUROSEMIDE 10 MG/ML IJ SOLN
80.0000 mg | Freq: Two times a day (BID) | INTRAMUSCULAR | Status: DC
  Administered 2017-12-12 – 2017-12-14 (×4): 80 mg via INTRAVENOUS
  Filled 2017-12-12 (×4): qty 8

## 2017-12-12 MED ORDER — PANTOPRAZOLE SODIUM 40 MG PO TBEC
40.0000 mg | DELAYED_RELEASE_TABLET | Freq: Every day | ORAL | Status: DC
  Administered 2017-12-13 – 2017-12-14 (×2): 40 mg via ORAL
  Filled 2017-12-12 (×2): qty 1

## 2017-12-12 MED ORDER — SODIUM CHLORIDE 0.9% FLUSH
3.0000 mL | Freq: Two times a day (BID) | INTRAVENOUS | Status: DC
  Administered 2017-12-12 – 2017-12-14 (×4): 3 mL via INTRAVENOUS

## 2017-12-12 MED ORDER — OXYCODONE HCL 5 MG/5ML PO SOLN: 5.0000 mg | Freq: Once | ORAL | Status: DC | PRN

## 2017-12-12 MED ORDER — SODIUM CHLORIDE 0.9% FLUSH: 3.0000 mL | INTRAVENOUS | Status: DC | PRN

## 2017-12-12 MED ORDER — BUPIVACAINE-EPINEPHRINE (PF) 0.5% -1:200000 IJ SOLN
INTRAMUSCULAR | Status: AC
  Filled 2017-12-12: qty 7.2

## 2017-12-12 MED ORDER — LIDOCAINE-EPINEPHRINE 2 %-1:100000 IJ SOLN
INTRAMUSCULAR | Status: AC
  Filled 2017-12-12: qty 6.8

## 2017-12-12 MED ORDER — FENTANYL CITRATE (PF) 250 MCG/5ML IJ SOLN
INTRAMUSCULAR | Status: AC
  Filled 2017-12-12: qty 5

## 2017-12-12 MED ORDER — LIDOCAINE 2% (20 MG/ML) 5 ML SYRINGE
INTRAMUSCULAR | Status: DC | PRN
  Administered 2017-12-12: 60 mg via INTRAVENOUS

## 2017-12-12 MED ORDER — WARFARIN - PHARMACIST DOSING INPATIENT: Freq: Every day | Status: DC

## 2017-12-12 MED ORDER — FENTANYL CITRATE (PF) 100 MCG/2ML IJ SOLN
25.0000 ug | INTRAMUSCULAR | Status: DC | PRN
  Administered 2017-12-12: 50 ug via INTRAVENOUS

## 2017-12-12 MED ORDER — LACTATED RINGERS IV SOLN
INTRAVENOUS | Status: DC | PRN
  Administered 2017-12-12: 08:00:00 via INTRAVENOUS

## 2017-12-12 MED ORDER — LIDOCAINE-EPINEPHRINE 2 %-1:100000 IJ SOLN
INTRAMUSCULAR | Status: DC | PRN
  Administered 2017-12-12: 10.2 mL

## 2017-12-12 MED ORDER — 0.9 % SODIUM CHLORIDE (POUR BTL) OPTIME
TOPICAL | Status: DC | PRN
  Administered 2017-12-12: 1000 mL

## 2017-12-12 MED ORDER — IPRATROPIUM-ALBUTEROL 0.5-2.5 (3) MG/3ML IN SOLN
3.0000 mL | Freq: Four times a day (QID) | RESPIRATORY_TRACT | Status: DC
  Administered 2017-12-12 – 2017-12-13 (×2): 3 mL via RESPIRATORY_TRACT
  Filled 2017-12-12 (×2): qty 3

## 2017-12-12 MED ORDER — ONDANSETRON HCL 4 MG/2ML IJ SOLN
INTRAMUSCULAR | Status: AC
  Filled 2017-12-12: qty 2

## 2017-12-12 MED ORDER — SUGAMMADEX SODIUM 500 MG/5ML IV SOLN
INTRAVENOUS | Status: AC
  Filled 2017-12-12: qty 5

## 2017-12-12 MED ORDER — INSULIN ASPART 100 UNIT/ML ~~LOC~~ SOLN
0.0000 [IU] | Freq: Three times a day (TID) | SUBCUTANEOUS | Status: DC
  Administered 2017-12-12: 3 [IU] via SUBCUTANEOUS
  Administered 2017-12-13: 2 [IU] via SUBCUTANEOUS
  Administered 2017-12-13: 3 [IU] via SUBCUTANEOUS
  Administered 2017-12-14 (×2): 2 [IU] via SUBCUTANEOUS

## 2017-12-12 MED ORDER — SODIUM CHLORIDE 0.9 % IV SOLN: 250.0000 mL | INTRAVENOUS | Status: DC | PRN

## 2017-12-12 MED ORDER — LIDOCAINE-EPINEPHRINE 2 %-1:100000 IJ SOLN
INTRAMUSCULAR | Status: AC
  Filled 2017-12-12: qty 3.4

## 2017-12-12 MED ORDER — OXYMETAZOLINE HCL 0.05 % NA SOLN
NASAL | Status: AC
  Filled 2017-12-12: qty 15

## 2017-12-12 MED ORDER — OXYCODONE-ACETAMINOPHEN 5-325 MG PO TABS: 1.0000 | ORAL_TABLET | Freq: Four times a day (QID) | ORAL | Status: DC | PRN

## 2017-12-12 MED ORDER — ROCURONIUM BROMIDE 50 MG/5ML IV SOSY
PREFILLED_SYRINGE | INTRAVENOUS | Status: AC
  Filled 2017-12-12: qty 5

## 2017-12-12 MED ORDER — WARFARIN SODIUM 7.5 MG PO TABS
7.5000 mg | ORAL_TABLET | Freq: Once | ORAL | Status: AC
  Administered 2017-12-12: 7.5 mg via ORAL
  Filled 2017-12-12: qty 1

## 2017-12-12 MED ORDER — POTASSIUM CHLORIDE CRYS ER 20 MEQ PO TBCR: 60.0000 meq | EXTENDED_RELEASE_TABLET | Freq: Two times a day (BID) | ORAL | Status: DC

## 2017-12-12 MED ORDER — SUGAMMADEX SODIUM 500 MG/5ML IV SOLN
INTRAVENOUS | Status: DC | PRN
  Administered 2017-12-12: 225 mg via INTRAVENOUS

## 2017-12-12 MED ORDER — ACETAMINOPHEN 325 MG PO TABS: 650.0000 mg | ORAL_TABLET | ORAL | Status: DC | PRN

## 2017-12-12 MED ORDER — AMINOCAPROIC ACID SOLUTION 5% (50 MG/ML)
10.0000 mL | ORAL | Status: AC
  Administered 2017-12-12 (×8): 10 mL via ORAL
  Filled 2017-12-12 (×2): qty 100

## 2017-12-12 MED ORDER — ROCURONIUM BROMIDE 10 MG/ML (PF) SYRINGE
PREFILLED_SYRINGE | INTRAVENOUS | Status: DC | PRN
  Administered 2017-12-12: 50 mg via INTRAVENOUS
  Administered 2017-12-12: 20 mg via INTRAVENOUS

## 2017-12-12 MED ORDER — ALLOPURINOL 100 MG PO TABS
100.0000 mg | ORAL_TABLET | Freq: Every day | ORAL | Status: DC
  Administered 2017-12-13 – 2017-12-14 (×2): 100 mg via ORAL
  Filled 2017-12-12 (×2): qty 1

## 2017-12-12 MED ORDER — DOCUSATE SODIUM 100 MG PO CAPS
100.0000 mg | ORAL_CAPSULE | Freq: Two times a day (BID) | ORAL | Status: DC
  Administered 2017-12-13 – 2017-12-14 (×3): 100 mg via ORAL
  Filled 2017-12-12 (×3): qty 1

## 2017-12-12 MED ORDER — PROPOFOL 10 MG/ML IV BOLUS
INTRAVENOUS | Status: AC
  Filled 2017-12-12: qty 40

## 2017-12-12 MED ORDER — PROPOFOL 10 MG/ML IV BOLUS
INTRAVENOUS | Status: DC | PRN
  Administered 2017-12-12: 120 mg via INTRAVENOUS
  Administered 2017-12-12: 40 mg via INTRAVENOUS

## 2017-12-12 MED ORDER — MORPHINE SULFATE (PF) 2 MG/ML IV SOLN
2.0000 mg | INTRAVENOUS | Status: DC | PRN
  Administered 2017-12-12 – 2017-12-13 (×4): 2 mg via INTRAVENOUS
  Filled 2017-12-12 (×4): qty 1

## 2017-12-12 MED ORDER — LIDOCAINE 2% (20 MG/ML) 5 ML SYRINGE
INTRAMUSCULAR | Status: AC
  Filled 2017-12-12: qty 5

## 2017-12-12 MED ORDER — HEMOSTATIC AGENTS (NO CHARGE) OPTIME
TOPICAL | Status: DC | PRN
  Administered 2017-12-12: 1

## 2017-12-12 MED ORDER — ONDANSETRON HCL 4 MG/2ML IJ SOLN
INTRAMUSCULAR | Status: DC | PRN
  Administered 2017-12-12: 4 mg via INTRAVENOUS

## 2017-12-12 MED ORDER — OXYCODONE HCL 5 MG PO TABS: 5.0000 mg | ORAL_TABLET | Freq: Once | ORAL | Status: DC | PRN

## 2017-12-12 MED ORDER — SODIUM CHLORIDE 0.9 % IV SOLN
INTRAVENOUS | Status: DC | PRN
  Administered 2017-12-12: 25 ug/min via INTRAVENOUS

## 2017-12-12 MED ORDER — FENTANYL CITRATE (PF) 100 MCG/2ML IJ SOLN
INTRAMUSCULAR | Status: AC
  Administered 2017-12-12: 50 ug via INTRAVENOUS
  Filled 2017-12-12: qty 2

## 2017-12-12 MED ORDER — ONDANSETRON HCL 4 MG/2ML IJ SOLN: 4.0000 mg | Freq: Four times a day (QID) | INTRAMUSCULAR | Status: DC | PRN

## 2017-12-12 MED ORDER — ALBUMIN HUMAN 5 % IV SOLN
INTRAVENOUS | Status: DC | PRN
  Administered 2017-12-12: 10:00:00 via INTRAVENOUS

## 2017-12-12 MED ORDER — DEXAMETHASONE SODIUM PHOSPHATE 10 MG/ML IJ SOLN
INTRAMUSCULAR | Status: DC | PRN
  Administered 2017-12-12: 10 mg via INTRAVENOUS

## 2017-12-12 MED ORDER — HYDROCODONE-ACETAMINOPHEN 5-325 MG PO TABS: 1.0000 | ORAL_TABLET | ORAL | 0 refills | Status: DC | PRN

## 2017-12-12 MED ORDER — DEXAMETHASONE SODIUM PHOSPHATE 10 MG/ML IJ SOLN
INTRAMUSCULAR | Status: AC
  Filled 2017-12-12: qty 1

## 2017-12-12 MED ORDER — BUPIVACAINE-EPINEPHRINE 0.5% -1:200000 IJ SOLN
INTRAMUSCULAR | Status: DC | PRN
  Administered 2017-12-12: 3.6 mL

## 2017-12-12 MED ORDER — CEFAZOLIN SODIUM-DEXTROSE 2-4 GM/100ML-% IV SOLN
2.0000 g | Freq: Once | INTRAVENOUS | Status: AC
  Administered 2017-12-12: 2 g via INTRAVENOUS

## 2017-12-12 MED ORDER — FENTANYL CITRATE (PF) 250 MCG/5ML IJ SOLN
INTRAMUSCULAR | Status: DC | PRN
  Administered 2017-12-12: 50 ug via INTRAVENOUS
  Administered 2017-12-12 (×3): 25 ug via INTRAVENOUS

## 2017-12-12 MED ORDER — EZETIMIBE-SIMVASTATIN 10-20 MG PO TABS
1.0000 | ORAL_TABLET | Freq: Every day | ORAL | Status: DC
  Administered 2017-12-12 – 2017-12-13 (×3): 1 via ORAL
  Filled 2017-12-12 (×2): qty 1

## 2017-12-12 MED ORDER — CEFAZOLIN SODIUM-DEXTROSE 2-4 GM/100ML-% IV SOLN
INTRAVENOUS | Status: AC
  Filled 2017-12-12: qty 100

## 2017-12-12 MED ORDER — LACTATED RINGERS IV SOLN: INTRAVENOUS | Status: DC

## 2017-12-12 SURGICAL SUPPLY — 40 items
ALCOHOL 70% 16 OZ (MISCELLANEOUS) ×3 IMPLANT
ATTRACTOMAT 16X20 MAGNETIC DRP (DRAPES) ×3 IMPLANT
BANDAGE HEMOSTAT MRDH 4X4 STRL (MISCELLANEOUS) IMPLANT
BLADE SURG 15 STRL LF DISP TIS (BLADE) ×2 IMPLANT
BLADE SURG 15 STRL SS (BLADE) ×4
BNDG HEMOSTAT MRDH 4X4 STRL (MISCELLANEOUS)
COVER SURGICAL LIGHT HANDLE (MISCELLANEOUS) ×3 IMPLANT
GAUZE 4X4 16PLY RFD (DISPOSABLE) ×3 IMPLANT
GAUZE PACKING FOLDED 2  STR (GAUZE/BANDAGES/DRESSINGS) ×2
GAUZE PACKING FOLDED 2 STR (GAUZE/BANDAGES/DRESSINGS) ×1 IMPLANT
GAUZE SPONGE 4X4 16PLY XRAY LF (GAUZE/BANDAGES/DRESSINGS) ×6 IMPLANT
GLOVE BIO SURGEON STRL SZ 6.5 (GLOVE) ×2 IMPLANT
GLOVE BIO SURGEONS STRL SZ 6.5 (GLOVE) ×1
GLOVE SURG ORTHO 8.0 STRL STRW (GLOVE) ×3 IMPLANT
GOWN STRL REUS W/ TWL LRG LVL3 (GOWN DISPOSABLE) ×1 IMPLANT
GOWN STRL REUS W/TWL 2XL LVL3 (GOWN DISPOSABLE) ×3 IMPLANT
GOWN STRL REUS W/TWL LRG LVL3 (GOWN DISPOSABLE) ×2
HEMOSTAT SURGICEL 2X14 (HEMOSTASIS) ×3 IMPLANT
KIT BASIN OR (CUSTOM PROCEDURE TRAY) ×3 IMPLANT
KIT TURNOVER KIT B (KITS) ×3 IMPLANT
MANIFOLD NEPTUNE WASTE (CANNULA) ×3 IMPLANT
NEEDLE BLUNT 16X1.5 OR ONLY (NEEDLE) IMPLANT
NEEDLE DENTAL 27 LONG (NEEDLE) IMPLANT
NS IRRIG 1000ML POUR BTL (IV SOLUTION) ×3 IMPLANT
PACK EENT II TURBAN DRAPE (CUSTOM PROCEDURE TRAY) ×3 IMPLANT
PAD ARMBOARD 7.5X6 YLW CONV (MISCELLANEOUS) ×3 IMPLANT
SPONGE SURGIFOAM ABS GEL 100 (HEMOSTASIS) IMPLANT
SPONGE SURGIFOAM ABS GEL 12-7 (HEMOSTASIS) IMPLANT
SPONGE SURGIFOAM ABS GEL SZ50 (HEMOSTASIS) IMPLANT
SUCTION FRAZIER HANDLE 10FR (MISCELLANEOUS) ×4
SUCTION TUBE FRAZIER 10FR DISP (MISCELLANEOUS) ×2 IMPLANT
SUT CHROMIC 3 0 PS 2 (SUTURE) ×6 IMPLANT
SUT CHROMIC 4 0 P 3 18 (SUTURE) IMPLANT
SYR 50ML SLIP (SYRINGE) ×3 IMPLANT
TOWEL NATURAL 10PK STERILE (DISPOSABLE) ×3 IMPLANT
TUBE CONNECTING 12'X1/4 (SUCTIONS) ×1
TUBE CONNECTING 12X1/4 (SUCTIONS) ×2 IMPLANT
WATER STERILE IRR 1000ML POUR (IV SOLUTION) ×3 IMPLANT
WATER TABLETS ICX (MISCELLANEOUS) ×3 IMPLANT
YANKAUER SUCT BULB TIP NO VENT (SUCTIONS) ×3 IMPLANT

## 2017-12-12 NOTE — Transfer of Care (Signed)
Immediate Anesthesia Transfer of Care Note  Patient: [name]  Procedure(s) Performed: Extraction of tooth #'s 1, 12,13,14,15,17,18,19, 29, and 30 with alveoloplasty and gross debridement of remaining teeth` (Bilateral Mouth)  Patient Location: PACU  Anesthesia Type:General  Level of Consciousness: awake, alert , oriented and patient cooperative  Airway & Oxygen Therapy: Patient Spontanous Breathing and Patient connected to nasal cannula oxygen  Post-op Assessment: Report given to RN, Post -op Vital signs reviewed and stable and Patient moving all extremities X 4  Post vital signs: Reviewed and stable  Last Vitals:  Vitals Value Taken Time  BP 113/64 12/12/2017 11:06 AM  Temp 36.5 C 12/12/2017 11:06 AM  Pulse 87 12/12/2017 11:10 AM  Resp 11 12/12/2017 11:10 AM  SpO2 95 % 12/12/2017 11:10 AM  Vitals shown include unvalidated device data.  Last Pain:  Vitals:   12/12/17 1106  TempSrc:   PainSc: 0-No pain         Complications: No apparent anesthesia complications

## 2017-12-12 NOTE — Progress Notes (Signed)
Continued bloody oozing coming from oral cavity, left side more than the right. Guaze replacement with Amicar swish and spit cont. Orders received from Dr Enrique Sack and Dr. Marigene Ehlers, cardiologist updated.

## 2017-12-12 NOTE — Progress Notes (Signed)
PRE-OPERATIVE NOTE:  12/12/2017 [name] [id number]  VITALS: BP (!) 111/53   Pulse 92   Temp 98.7 F (37.1 C) (Oral)   Resp 16   SpO2 99% Comment: 2lit  Lab Results  Component Value Date   WBC 3.0 (L) 12/07/2017   HGB 8.7 (L) 12/07/2017   HCT 29.0 (L) 12/07/2017   MCV 105.1 (H) 12/07/2017   PLT 128 (L) 12/07/2017   BMET    Component Value Date/Time   NA 138 12/07/2017 1037   NA 140 07/27/2017 1159   NA 140 01/19/2017 0827   K 4.2 12/07/2017 1037   K 4.8 01/19/2017 0827   CL 93 (L) 12/07/2017 1037   CO2 31 12/07/2017 1037   CO2 24 01/19/2017 0827   GLUCOSE 99 12/07/2017 1037   GLUCOSE 103 01/19/2017 0827   BUN 31 (H) 12/07/2017 1037   BUN 44 (H) 07/27/2017 1159   BUN 28.4 (H) 01/19/2017 0827   CREATININE 1.44 (H) 12/07/2017 1037   CREATININE 1.43 (H) 04/20/2017 0943   CREATININE 1.2 01/19/2017 0827   CALCIUM 9.8 12/07/2017 1037   CALCIUM 9.7 01/19/2017 0827   GFRNONAA 44 (L) 12/07/2017 1037   GFRNONAA 45 (L) 04/20/2017 0943   GFRAA 51 (L) 12/07/2017 1037   GFRAA 52 (L) 04/20/2017 0943    Lab Results  Component Value Date   INR 1.20 12/12/2017   INR 2.9 12/05/2017   INR 2.0 11/27/2017   No results found for: PTT   [name] presents for multiple dental extractions with alveoloplasty, gross debridement of remaining dentition, and consideration for pre-prosthetic surgery at time of surgery.  Patient is aware that additional teeth may be added to the plan for extraction upon further examination in the operating room while under general anesthesia.  SUBJECTIVE: The patient denies any acute medical or dental changes and agrees to proceed with treatment as planned.  EXAM: No sign of acute dental changes.  ASSESSMENT: Patient is affected by chronic apical periodontitis, dental caries, chronic periodontitis, loose teeth, accretions, and mandibular tori.  PLAN: Patient agrees to proceed with treatment as planned in the operating room as previously  discussed and accepts the risks, benefits, and complications of the proposed treatment. Patient is aware of the risk for bleeding, bruising, swelling, infection, pain, nerve damage, soft tissue damage, damage to adjacent teeth, sinus involvement, root tip fracture, mandible fracture, and the risks of complications associated with the anesthesia. Patient also is aware of the potential for other complications to and including death due to his overall cardiovascular and respiratory compromise.     [name], DDS

## 2017-12-12 NOTE — Plan of Care (Signed)
  Problem: Education: Goal: Knowledge of General Education information will improve Description Including pain rating scale, medication(s)/side effects and non-pharmacologic comfort measures Outcome: Completed/Met

## 2017-12-12 NOTE — Progress Notes (Addendum)
ANTICOAGULATION CONSULT NOTE - Initial Consult  Pharmacy Consult for warfarin Indication: mechanical aortic valve/atrial fibrillation  Allergies  Allergen Reactions  . Rifampin Rash    May have been caused by Vancomycin or Rifampin (??)  . Vancomycin Rash    May have been caused by Vancomycin or Rifampin (??)    Patient Measurements: Height: 6\' 2"  (188 cm) Weight: 232 lb 1.6 oz (105.3 kg) IBW/kg (Calculated) : 82.2 Heparin Dosing Weight: 103.5 kg  Vital Signs: Temp: 97.5 F (36.4 C) (09/11 1751) Temp Source: Axillary (09/11 1751) BP: 119/55 (09/11 1751) Pulse Rate: 104 (09/11 1751)  Labs: Recent Labs    12/12/17 0638  LABPROT 15.1  INR 1.20    Estimated Creatinine Clearance: 52.9 mL/min (A) (by C-G formula based on SCr of 1.44 mg/dL (H)).   Medical History: Past Medical History:  Diagnosis Date  . Carotid artery disease ([place]) 1994   s/p left carotid endarerectomy   . Chronic atrial fibrillation (HCC)    a. on coumadin   . Chronic diastolic CHF (congestive heart failure) ([place])   . Diabetes mellitus without complication ([place])    dx 2016  . Dyspnea   . Heart murmur   . Hx of CABG    a. 1994  . Hypertension   . OSA (obstructive sleep apnea)   . Rheumatic fever   . S/P AVR (aortic valve replacement)    a. mechanical valve 1996  . Subclavian bypass stenosis ([place])   . 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 Vitamin B12 deficiency     Medications:  Scheduled:  . allopurinol  100 mg Oral Daily  . aminocaproic acid  10 mL Oral Q1H  . docusate sodium  100 mg Oral BID  . ezetimibe-simvastatin  1 tablet Oral QHS  . furosemide  80 mg Intravenous BID  . insulin aspart  0-15 Units Subcutaneous TID WC  . ipratropium-albuterol  3 mL Nebulization QID  . pantoprazole  40 mg Oral Daily  . potassium chloride SA  60 mEq Oral BID  . sodium chloride flush  3 mL Intravenous Q12H    Assessment: 25 yof with h/o  mechanical AVR in 1994 presented for elective dental extraction for chronic apical periodontitis. On warfarin for MVR/AFib PTA - was being bridged with enoxaparin while warfarin on hold for procedure (LD enox on 9/10).   Okay per Dr Enrique Sack to resume warfarin tonight and reevaluate in morning prior to starting enoxaparin therapy. Of note, some bloody oozing still present from oral cavity. INR 1.2 today.   Home regimen is 5 mg daily except 7.5 mg on MWF.  Goal of Therapy:  INR 2.5-3.5 Monitor platelets by anticoagulation protocol: Yes   Plan:  Order warfarin 7.5 mg tonight  Monitor daily INR, CBC, for s/sx of bleeding F/u plan for enoxaparin/heparin bridge  Doylene Canard, PharmD Clinical Pharmacist  Pager: [phone] Phone: [phone] 12/12/2017,6:06 PM   Addendum: Damaris Schooner with RN regarding patient this evening and patient still has bleeding from his teeth extraction earlier today.  Since this is the first dose and INR was 1.2 this morning; feel that the 7.5mg  dose should be fine since full effect won't be seen for ~ 72 hours.  Will continue to monitor bleeding and discuss with MD as needed.  Rober Minion, PharmD., MS Clinical Pharmacist Pager:  [phone] Thank you for allowing pharmacy to be part of this patients care team.

## 2017-12-12 NOTE — Anesthesia Procedure Notes (Signed)
Procedure Name: Intubation Date/Time: 12/12/2017 8:37 AM Performed by: Colin Benton, CRNA Pre-anesthesia Checklist: Patient identified, Emergency Drugs available, Suction available and Patient being monitored Patient Re-evaluated:Patient Re-evaluated prior to induction Oxygen Delivery Method: Circle system utilized Preoxygenation: Pre-oxygenation with 100% oxygen Induction Type: IV induction Ventilation: Mask ventilation without difficulty and Oral airway inserted - appropriate to patient size Laryngoscope Size: Mac and 4 Grade View: Grade I Tube type: Oral Tube size: 7.5 mm Number of attempts: 1 Airway Equipment and Method: Stylet Placement Confirmation: ETT inserted through vocal cords under direct vision,  positive ETCO2 and breath sounds checked- equal and bilateral Secured at: 24 cm Tube secured with: Tape

## 2017-12-12 NOTE — Anesthesia Preprocedure Evaluation (Addendum)
Anesthesia Evaluation  Patient identified by MRN, date of birth, ID band Patient awake    Reviewed: Allergy & Precautions, NPO status , Patient's Chart, lab work & pertinent test results  History of Anesthesia Complications Negative for: history of anesthetic complications  Airway Mallampati: I  TM Distance: >3 FB Neck ROM: Full    Dental  (+) Poor Dentition   Pulmonary shortness of breath, with exertion and Long-Term Oxygen Therapy, sleep apnea , COPD,  COPD inhaler and oxygen dependent, former smoker,    breath sounds clear to auscultation       Cardiovascular hypertension, Pt. on medications (-) angina+ CAD, + CABG, + Peripheral Vascular Disease and +CHF  + dysrhythmias + Valvular Problems/Murmurs MR  Rhythm:Irregular     Neuro/Psych negative neurological ROS  negative psych ROS   GI/Hepatic negative GI ROS, Neg liver ROS,   Endo/Other  diabetes  Renal/GU Renal InsufficiencyRenal disease     Musculoskeletal   Abdominal   Peds  Hematology  (+) anemia ,   Anesthesia Other Findings TTE 7/19:  Left ventricle: The cavity size was normal. Wall thickness was   increased in a pattern of mild LVH. Systolic function was normal.   The estimated ejection fraction was in the range of 55% to 60%.   Wall motion was normal; there were no regional wall motion   abnormalities. D-shaped septum consistent with RV pressure/volume   overload. - Aortic valve: Mechanical aortic valve with no significant   regurgitation, unable to obtain mean gradient as transgastric   windows were poor. - Aorta: Normal caliber ascending aorta. - Mitral valve: The mitral valve was calcified and thickened,   especially the posterior leaflet that was restricted. Mean   gradient 5 mmHg but MVA by PHT > 2 cm^2. I think there was only   minimal mitral stenosis. There was severe mitral regurgitation,   likely due to restriction of posterior leaflet and  annular   dilation from severe LAE (mixed functional and primary etiology).   ERO 0.42 cm^2 by PISA. - Left atrium: The atrium was severely dilated. No evidence of   thrombus in the atrial cavity or appendage. - Pulmonary veins: There was systolic flow reversal in the   pulmonary vein doppler pattern. - Right ventricle: The cavity size was mildly dilated. Systolic   function was mildly reduced. - Right atrium: The atrium was moderately dilated. - Atrial septum: No defect or patent foramen ovale was identified. - Tricuspid valve: Mild tricuspid regurgitation with peak RV-RA   gradient 25 mmHg.  S/p AVR 1994 (mechanical)    DISCUSSION: 82 yo male former smoker for above procedure. 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  Recently hospitalized 7/18-11/02/2017 for acute on chronic diastolic CHF. He diuresed 83 lbs with lasix drip, metolazone, and diamox. Determined not to be a good candidate for MitraClip. Dr Liston Alba consulted andrecommended aggressive medical therapy versus possible experimental percutaneous MVR as he felt very high risk for conventional surgery.Co urse complicated by drop in hemoglobin and symptomatic hypotension, requiring 1 unit pRBCs.  Echo during admission showed EF 55-65%, mechanical aortic valve functioning normally, mild mitral stenosis/severe mitral regurgitation, RV moderately dilated with moderately decreased systolic function, PASP 49 mmHg. Severe MR could be contributing to the significant RV failure.  I saw pt at PAT appointmentto discuss if any contraindication to nasotracheal intubation. He denied ever having broken nose, nasal surgery, or any difficulty breathing through his nose. He did report that over the past 6  months he has had several severe nosebleeeds from the left nares requiring ED or UC treatment. He denies any bleeding from right nares.  Last dose of coumadin 9/6, will be starting lovenox injections 9/7.  Pt medically complex with recent decompensated HF requiring prolonged hospitalization. He is now seeing HF clinic every 2 weeks and appears markedly improved but still has significant bilateral LE edema. He will need DOS eval by assigned anesthesiologist. Anticipate he can proceed with surgery as planned barring acute status change.  VS: BP 109/63   Pulse (!) 108   Temp 36.7 C (Oral)   Ht 6\' 2"  (1.88 m)   Wt 105.9 kg   SpO2 94%   BMI 29.97 kg/m    PROVIDERS: [name], [name], MD is PCP  Daneen Schick, MD is Cardiologist last seen 11/16/2017  Loralie Champagne, MD is HF cardiologist, last seen in office 12/05/2017 by Lillia Mountain, NP  Reproductive/Obstetrics                            Anesthesia Physical Anesthesia Plan  ASA: IV  Anesthesia Plan: General   Post-op Pain Management:    Induction: Intravenous  PONV Risk Score and Plan: 2 and Ondansetron and Dexamethasone  Airway Management Planned: Oral ETT  Additional Equipment: None  Intra-op Plan:   Post-operative Plan: Extubation in OR  Informed Consent: I have reviewed the patients History and Physical, chart, labs and discussed the procedure including the risks, benefits and alternatives for the proposed anesthesia with the patient or authorized representative who has indicated his/her understanding and acceptance.   Dental advisory given  Plan Discussed with: CRNA and Surgeon  Anesthesia Plan Comments:         Anesthesia Quick Evaluation

## 2017-12-12 NOTE — Discharge Instructions (Signed)

## 2017-12-12 NOTE — H&P (Signed)
12/12/2017  Patient:            [name] Date of Birth:  08/11/1935 MRN:                [id number]   BP (!) 111/53   Pulse 92   Temp 98.7 F (37.1 C) (Oral)   Resp 16   SpO2 99% Comment: 2lit   Lord Gillentine is an 82 year old male recently diagnosed with severe mitral regurgitation.  The patient has a complex medical history including previous aortic valve replacement, chronic anticoagulation, chronic diastolic heart failure, obstructive sleep apnea, diabetes mellitus, and thrombocytopenia.  Patient was evaluated for history of poor dentition.  Patient now presents for extraction of multiple teeth with alveoloplasty and gross debridement remaining dentition in the operating room with general anesthesia.  The patient denies any acute medical or dental changes.  The Coumadin therapy has been discontinued and patient has been bridged with Lovenox therapy.  Please see note from Cardiology dated 12/05/2017 to act as the H&P for the dental operating a procedure.   Lenn Cal, DDS  Advanced Heart Failure Clinic Note   PCP: [name], [name], MD PCP-Cardiologist: [name], MD  HF: [name]  HPI: [name] a 82 y.o.malewith h/o mechanical AVR 1994, OSA on CPAP, Chronic afib, chronic combined CHF, CKD III, chronic coumadin therapy, CAD s/p CAGB 1994, and COPD.   Admitted 7/18-11/05/17 from [facility] office with A/C diastolic HF. Diuresed 83 lbs with lasix drip, metolazone, and diamox. Transitioned to torsemide 80 mg BID. Determined not to be a candidate for MitraClip. Dr Liston Alba consulted andrecommended aggressive medical therapy versus possible experimental percutaneous MVR as he felt very high risk for conventional surgery.Coarse complicated by drop in hemoglobin and symptomatic hypotension, requiring 1 unit pRBCs. GI consulted and did not recommend scoping. BB and spiro were stopped due to low BPs. Had some AKI assoicated with low BP, but resolved by discharge.  Required lovenox bridge at DC with subtherapeutic INR. Discharged with [facility]. DC weight: 216 lbs.   He presents today for regular follow up with his wife. Last visit torsemide was increased and spiro was started. Overall doing well. He has been working with [facility] PT and is getting stronger. Able to walk 1/2 block with walker. Denies SOB, orthopnea, or PND. He has BLE edema that has been getting worse. Has unna boots. Wearing O2 at all times and BiPAP qHS. He has good UOP with am torsemide, but not as much with pm dose. He had a small nosebleed this morning, but no other bleeding. Plans to get tooth extraction next week. He says coumadin clinic will have him on lovenox/coumadin bridge. He says dentist will give him antibiotics. Taking all medications. Limits fluid and salt. Weights have gone up 229 > 235 lbs in the last couple weeks.   Review of systems complete and found to be negative unless listed in HPI.       Past Medical History:  Diagnosis Date  . Carotid artery disease ([place]) 1994   s/p left carotid endarerectomy   . Chronic atrial fibrillation ([facility])    a. on coumadin   . Chronic diastolic CHF (congestive heart failure) ([place])   . Hx of CABG    a. 1994  . Hypertension   . OSA (obstructive sleep apnea)   . Rheumatic fever   . S/P AVR (aortic valve replacement)    a. mechanical valve 1996  . Subclavian bypass stenosis ([place])   .  Vitamin B12 deficiency           Current Outpatient Medications  Medication Sig Dispense Refill  . Cholecalciferol (VITAMIN D3) 2000 units capsule Take 1 capsule (2,000 Units total) by mouth daily. 100 capsule 3  . colchicine 0.6 MG tablet Take 1 tablet (0.6 mg total) by mouth daily. 30 tablet 0  . Cyanocobalamin (VITAMIN B-12) 3000 MCG SUBL Take 3,000 mcg by mouth daily.     . Dietary Management Product (VASCULERA) TABS Take 1 tablet by mouth daily. 90 tablet 3  . docusate sodium (COLACE) 100 MG capsule Take 1 capsule (100 mg total) by  mouth 2 (two) times daily. 10 capsule 0  . ezetimibe-simvastatin (VYTORIN) 10-20 MG tablet Take 1 tablet by mouth at bedtime. 90 tablet 3  . folic acid (FOLVITE) 1 MG tablet Take 1 tablet (1 mg total) by mouth daily.    Marland Kitchen glucose blood (ONE TOUCH ULTRA TEST) test strip USE AS DIRECTED TO TEST BLOOD GLUCOSE  EVERY OTHER DAY DX: 250.00 50 each 2  . glucose blood (ONETOUCH VERIO) test strip Use as instructed to check blood sugar once daily. Dx Code E11.8 100 each 3  . hydrocortisone cream 1 % Apply 1 application topically as needed for itching (for legs).    Marland Kitchen ipratropium-albuterol (DUONEB) 0.5-2.5 (3) MG/3ML SOLN Take 3 mLs by nebulization 4 (four) times daily. 360 mL 3  . metFORMIN (GLUCOPHAGE-XR) 750 MG 24 hr tablet TAKE 2 TABLETS (1,500 MG TOTAL) BY MOUTH DAILY. 180 tablet 2  . NON FORMULARY BiPAP: At bedtime    . ONETOUCH DELICA LANCETS FINE MISC Use to obtain a blood specimen every day Dx code E11.8 100 each 1  . pantoprazole (PROTONIX) 40 MG tablet Take 1 tablet (40 mg total) by mouth daily. 14 tablet 0  . polyethylene glycol (MIRALAX / GLYCOLAX) packet Take 17 g by mouth daily. Hold for diarrhea. 14 each 0  . potassium chloride SA (K-DUR,KLOR-CON) 20 MEQ tablet Take 3 tablets (60 mEq total) by mouth 2 (two) times daily. 180 tablet 6  . PROAIR HFA 108 (90 Base) MCG/ACT inhaler INHALE 2 PUFFS INTO THE LUNGS EVERY 6 HOURS AS NEEDED (FOR CHEST CONGESTION). 1 Inhaler 11  . senna (SENOKOT) 8.6 MG TABS tablet Take 1 tablet (8.6 mg total) by mouth daily as needed for mild constipation. 120 each 0  . spironolactone (ALDACTONE) 25 MG tablet Take 0.5 tablets (12.5 mg total) by mouth daily. 45 tablet 3  . torsemide (DEMADEX) 20 MG tablet Take 100 mg (5 tabs) in am and 80 mg (4 tabs) in pm 270 tablet 11  . warfarin (COUMADIN) 5 MG tablet Take 10 mg (2 tabs) on 8/5, then 7.5 mg (1.5 tabs) until follow up in coumadin clinic, then take as directed. (Patient taking differently: Take 5-7.5 mg by mouth See  admin instructions. Take 7.5 mg by mouth on Monday, Wednesday and Friday. Take 5 mg on Sunday, Tuesday, Thursday and Saturday.) 45 tablet 6   No current facility-administered medications for this encounter.          Allergies  Allergen Reactions  . Rifampin Rash    May have been caused by Vancomycin or Rifampin (??)  . Vancomycin Rash    May have been caused by Vancomycin or Rifampin (??)      Social History   Socioeconomic History  . Marital status: Married    Spouse name: Not on file  . Number of children: 3  . Years of education: Not  on file  . Highest education level: Not on file  Occupational History  . Not on file  Social Needs  . Financial resource strain: Not on file  . Food insecurity:    Worry: Not on file    Inability: Not on file  . Transportation needs:    Medical: Not on file    Non-medical: Not on file  Tobacco Use  . Smoking status: Former Smoker    Packs/day: 1.00    Years: 30.00    Pack years: 30.00    Types: Cigarettes    Last attempt to quit: 04/03/1992    Years since quitting: 25.6  . Smokeless tobacco: Never Used  Substance and Sexual Activity  . Alcohol use: Yes    Comment: 4-6 ounces of wine daily  . Drug use: No    Comment: Half a cup a day.  Marland Kitchen Sexual activity: Not on file  Lifestyle  . Physical activity:    Days per week: Not on file    Minutes per session: Not on file  . Stress: Not on file  Relationships  . Social connections:    Talks on phone: Not on file    Gets together: Not on file    Attends religious service: Not on file    Active member of club or organization: Not on file    Attends meetings of clubs or organizations: Not on file    Relationship status: Not on file  . Intimate partner violence:    Fear of current or ex partner: Not on file    Emotionally abused: Not on file    Physically abused: Not on file    Forced sexual activity: Not on file  Other  Topics Concern  . Not on file  Social History Narrative   Patient is married and lives with his wife.   Youngest daughter lives in [place] also.   Patient has 2 other children.           Family History  Problem Relation Age of Onset  . Cancer Father        lung   . Hypertension Mother        Vitals:   12/05/17 1010  BP: 102/64  Pulse: 83  SpO2: 97%  Weight: 106.7 kg (235 lb 2 oz)      Wt Readings from Last 3 Encounters:  12/05/17 106.7 kg (235 lb 2 oz)  11/16/17 103.3 kg (227 lb 12.8 oz)  11/14/17 102.1 kg (225 lb)    PHYSICAL EXAM: General: Elderly. No resp difficulty. Arrived in wheelchair HEENT: Normal Neck: Supple. JVP 10. Carotids 2+ bilat; no bruits. No thyromegaly or nodule noted. Cor: PMI nondisplaced. IRR, mechanical S1, 2/6 HSM at apex Lungs: CTAB, normal effort. Abdomen: Soft, non-tender, non-distended, no HSM. No bruits or masses. +BS  Extremities: No cyanosis, clubbing, or rash. R and LLE 2+ edema into thighs. BLE unna boots Neuro: Alert & orientedx3, cranial nerves grossly intact. moves all 4 extremities w/o difficulty. Affect pleasant   ASSESSMENT & PLAN:  1. Chronic diastolic CHF with prominent RV failure: Echo 10/2017: EF 55-65%, mechanical aortic valve functioning normally, mild mitral stenosis/severe mitral regurgitation, RV moderately dilated with moderately decreased systolic function, PASP 49 mmHg. Severe MR could be contributing to the significant RV failure.  - NYHA class III, not very active.  - Volume status elevated. Weight up 10 lbs.  - Increase torsemide to 100 mg BID. Keep KCl 60 meq BID (Creat 1.26, K 4.5 on 8/30) -  Take 2.5 mg metolazone x1 today. BMET in 1 week through [facility].  - Continue spiro 12.5 mg qHS. Will not increase today with soft BP - Continue unna boots through [facility].  - Discussed limiting fluid and salt intake.  2. Mechanical aortic valve:Appears to function well on echo last admission.  - Continue  warfarin with INR goal 2.5-3.5 (also with atrial fibrillation).  - Denies bleeding  3. Atrial fibrillation: Chronic.   - Rate controlled. 4. CAD: s/p CABG. - No s/s ischemia - Continue Vytorin.  5. COPD: - Continue home oxygen. Continue humidifier. 6. OSA:  - Continue BiPAP qHS 7. CKD: Stage 3.  - Creatinine improved 1.26. BMET next week through [facility]. 8. Anemia:  - Required blood transfusion while inpatient. GI saw. Guaiac negative. Scope not indicated. - Continue Protonix. Denies bleeding. Last hemoglobin 7.9. Check CBC with [facility] labs next week. 9. Thrombocytopenia - Chronic.  10. 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  - Structural Heart team evaluated. Not a Mitraclip candidate. - Has been seen by Dr. [name] who recommended aggressive medical therapy versus possible experimental percutaneous MVR as he felt very high risk for conventional surgery.  11. Gout:  - Stop colchicine.Start allopurinol 100 mg daily. Discussed dosing with PharmD. Further management by PCP. 12. Dental Extractions - Coumadin clinic plans to arrange coumadin/lovenox bridge - Pt says oral surgeon will provide antibiotics at pre-op appointment - Encouraged pt to contact us if he needs any assistance  Increase torsemide to 100 mg BID Take 2.5 mg metolazone x1 today Renew [facility] PT BMET, CBC next week through [facility] Stop colchicine. Start allopurinol 100 mg daily. Follow up in 2 weeks  Georgiana Shore, NP 12/05/17  Greater than 50% of the 25 minute visit was spent in counseling/coordination of care regarding disease state education, salt/fluid restriction, sliding scale diuretics, and medication compliance.          Electronically signed by Georgiana Shore, NP at 12/05/2017 11:05 AM

## 2017-12-12 NOTE — H&P (Addendum)
History & Physical    Patient ID: [name] MRN: [id number], DOB/AGE: [birth year]   Admit date: 12/12/2017  Primary Physician: Cassandria Anger, MD Primary Cardiologist: Dr. Daneen Schick, MD Heart Failure: Dr. Loralie Champagne, MD   Patient Profile    [name] a [phone] Mwith h/o mechanical AVR 1994, OSA on CPAP, permanent atrial fibrillation on chronic coumadin therapy, chronic combined CHF followed by [facility], CKD III, DM, CAD s/p CAGB 1994 and recently diagnosed COPD and severe mitral regurgitation who presented to [facility] on 12/12/17 for elective dental extraction for chronic apical periodontitis per Dr. Enrique Sack. Cardiology team has been asked to admit secondary to complex medical history.   Past Medical History   Past Medical History:  Diagnosis Date  . Carotid artery disease ([place]) 1994   s/p left carotid endarerectomy   . Chronic atrial fibrillation (HCC)    a. on coumadin   . Chronic diastolic CHF (congestive heart failure) ([place])   . Diabetes mellitus without complication ([place])    dx 2016  . Dyspnea   . Heart murmur   . Hx of CABG    a. 1994  . Hypertension   . OSA (obstructive sleep apnea)   . Rheumatic fever   . S/P AVR (aortic valve replacement)    a. mechanical valve 1996  . Subclavian bypass stenosis ([place])   . 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 Vitamin B12 deficiency     Past Surgical History:  Procedure Laterality Date  . Ozan   replaced due to aortic stenosis, [employer] mechanical prostesis  . CAROTID ENDARTERECTOMY Left 1997   subclavian bypass Done in [place]  . CORONARY ARTERY BYPASS GRAFT  1994   w SVG to RCA and SVG to circumflex  . heart bypass     Done in [place]  . TEE WITHOUT CARDIOVERSION N/A 10/23/2017   Procedure: TRANSESOPHAGEAL ECHOCARDIOGRAM (TEE);  Surgeon: Larey Dresser, MD;  Location: [facility] ENDOSCOPY;  Service: Cardiovascular;  Laterality: N/A;    . TONSILLECTOMY       Allergies  Allergies  Allergen Reactions  . Rifampin Rash    May have been caused by Vancomycin or Rifampin (??)  . Vancomycin Rash    May have been caused by Vancomycin or Rifampin (??)    History of Present Illness    Pt was admitted 7/18-11/05/17 from [facility] office for decompensation and development of anasarca which led to the diagnosis of acute on chronic diastolic heart failure and severe mitral regurgitation in the setting of a rheumatic valve. During that hospitalization he was diuresed 8 lbs with Lasix gtt, metolazone, and diamox. He was then transitioned to torsemide 80 mg BID. TCTS and structural heart teams were consulted and determined that he was not a candidate for MitraClip with recommendations for aggressive medical therapy. Dr. [name] mentioned possible experimental percutaneous MVR however felt that the patient was very high risk for conventional surgery. His BB and spironolactone were stopped secondary to hypotension and he had a brief issue with AKI which resolved prior to discharge. He required a Lovenox bridge secondary to subtherapeutic INR. Weight at discharge on 11/05/17 was 216lb.   He was last seen in follow up by [facility] on 12/05/17 and doing well overall. He had been working with AHF PT and able to increase his exercise tolerance. He was noted to have BLE and was wearing unna boots along with continuous supplemental O2  and Bipap QHS. His weight at that appointment on 12/05/17 was 235lb. Given his weight gain, his torsemide was increased to 100mg  twice daily with KCL 62meq twice daily. He was to continue spironolactone 12.5mg  QHS.  His Coumadin was held with Lovenox bridge prior to planned dental surgery, scheduled for today, 12/12/17. Today, he denies anginal pain, SOB, palpitations. He admits to a slight increase in his weight by a couple of pounds. He is having significant mount pain and bleeding. He states that the procedure was preventative secondary  to his worsening valve. His is followed very closely in the [facility] and with his primary cardiologist.   Cardiology has been asked to admit the pt for post-operative observation secondary to complex medical history.   Home Medications    Prior to Admission medications   Medication Sig Start Date End Date Taking? Authorizing Provider  allopurinol (ZYLOPRIM) 100 MG tablet Take 1 tablet (100 mg total) by mouth daily. 12/05/17  Yes Georgiana Shore, NP  Cholecalciferol (VITAMIN D3) 2000 units capsule Take 1 capsule (2,000 Units total) by mouth daily. 04/05/17  Yes [name], [name], MD  Cyanocobalamin (VITAMIN B-12) 3000 MCG SUBL Take 3,000 mcg by mouth daily.    Yes [provider]  Dietary Management Product (VASCULERA) TABS Take 1 tablet by mouth daily. 04/05/17  Yes [name], [name], MD  enoxaparin (LOVENOX) 100 MG/ML injection Inject 1 mL (100 mg total) into the skin every 12 (twelve) hours. 12/05/17  Yes Belva Crome, MD  ezetimibe-simvastatin (VYTORIN) 10-20 MG tablet Take 1 tablet by mouth at bedtime. 04/05/17  Yes [name], [name], MD  folic acid (FOLVITE) 1 MG tablet Take 1 tablet (1 mg total) by mouth daily. 12/02/14  Yes Brunetta Genera, MD  metFORMIN (GLUCOPHAGE-XR) 750 MG 24 hr tablet TAKE 2 TABLETS (1,500 MG TOTAL) BY MOUTH DAILY. 07/03/17  Yes Elayne Snare, MD  metolazone (ZAROXOLYN) 2.5 MG tablet Take only as directed by CHF clinic. 12/05/17  Yes Georgiana Shore, NP  polyethylene glycol [facility] / GLYCOLAX) packet Take 17 g by mouth daily. Hold for diarrhea. 11/05/17  Yes Georgiana Shore, NP  potassium chloride SA (K-DUR,KLOR-CON) 20 MEQ tablet Take 3 tablets (60 mEq total) by mouth 2 (two) times daily. 11/05/17  Yes Georgiana Shore, NP  PROAIR HFA 108 [phone] Base) MCG/ACT inhaler INHALE 2 PUFFS INTO THE LUNGS EVERY 6 HOURS AS NEEDED (FOR CHEST CONGESTION). 10/16/17  Yes [name], [name], MD  senna (SENOKOT) 8.6 MG TABS tablet Take 1 tablet (8.6 mg total) by mouth daily as needed  for mild constipation. 11/05/17  Yes Georgiana Shore, NP  spironolactone (ALDACTONE) 25 MG tablet Take 0.5 tablets (12.5 mg total) by mouth daily. 11/14/17 02/12/18 Yes Georgiana Shore, NP  torsemide (DEMADEX) 100 MG tablet Take 1 tablet (100 mg total) by mouth 2 (two) times daily. 12/05/17  Yes Georgiana Shore, NP  warfarin (COUMADIN) 5 MG tablet Take 10 mg (2 tabs) on 8/5, then 7.5 mg (1.5 tabs) until follow up in coumadin clinic, then take as directed. Patient taking differently: Take 5-7.5 mg by mouth See admin instructions. Take 7.5 mg by mouth on Monday, Wednesday and Friday. Take 5 mg on Sunday, Tuesday, Thursday and Saturday. 11/05/17  Yes Georgiana Shore, NP  docusate sodium (COLACE) 100 MG capsule Take 1 capsule (100 mg total) by mouth 2 (two) times daily. 11/05/17   Georgiana Shore, NP  glucose blood (ONE TOUCH ULTRA TEST) test strip USE  AS DIRECTED TO TEST BLOOD GLUCOSE  EVERY OTHER DAY DX: 250.00 08/23/16   Elayne Snare, MD  glucose blood (ONETOUCH VERIO) test strip Use as instructed to check blood sugar once daily. Dx Code E11.8 05/07/17   Elayne Snare, MD  HYDROcodone-acetaminophen (NORCO) 5-325 MG tablet Take 1 tablet by mouth every 4 (four) hours as needed for moderate pain or severe pain. 12/12/17   Lenn Cal, DDS  hydrocortisone cream 1 % Apply 1 application topically as needed for itching (for legs).    [provider]  ipratropium-albuterol (DUONEB) 0.5-2.5 (3) MG/3ML SOLN Take 3 mLs by nebulization 4 (four) times daily. 10/17/17   [name], [name], MD  NON FORMULARY BiPAP: At bedtime    [provider]  [place] Use to obtain a blood specimen every day Dx code E11.8 05/07/17   Elayne Snare, MD  pantoprazole (PROTONIX) 40 MG tablet Take 1 tablet (40 mg total) by mouth daily. 11/06/17   Georgiana Shore, NP    Family History    Family History  Problem Relation Age of Onset  . Cancer Father        lung   . Hypertension Mother     Social  History    Social History   Socioeconomic History  . Marital status: Married    Spouse name: Not on file  . Number of children: 3  . Years of education: Not on file  . Highest education level: Not on file  Occupational History  . Not on file  Social Needs  . Financial resource strain: Not on file  . Food insecurity:    Worry: Not on file    Inability: Not on file  . Transportation needs:    Medical: Not on file    Non-medical: Not on file  Tobacco Use  . Smoking status: Former Smoker    Packs/day: 1.00    Years: 30.00    Pack years: 30.00    Types: Cigarettes    Last attempt to quit: 04/03/1992    Years since quitting: 25.7  . Smokeless tobacco: Never Used  Substance and Sexual Activity  . Alcohol use: Yes    Comment: 4-6 ounces of wine daily  . Drug use: No    Comment: Half a cup a day.  Marland Kitchen Sexual activity: Not on file  Lifestyle  . Physical activity:    Days per week: Not on file    Minutes per session: Not on file  . Stress: Not on file  Relationships  . Social connections:    Talks on phone: Not on file    Gets together: Not on file    Attends religious service: Not on file    Active member of club or organization: Not on file    Attends meetings of clubs or organizations: Not on file    Relationship status: Not on file  . Intimate partner violence:    Fear of current or ex partner: Not on file    Emotionally abused: Not on file    Physically abused: Not on file    Forced sexual activity: Not on file  Other Topics Concern  . Not on file  Social History Narrative   Patient is married and lives with his wife.   Youngest daughter lives in [place] also.   Patient has 2 other children.     Review of Systems   General: Denies fevers, chills, myalgias. Reports weight improvement.  Cardiovascular: Denies chest pain,  palpitations, and SOB at rest. + lower extremity swelling.  Respiratory- Denies wheezing, coughing. HEENT: + mouth pain with  bleeding. Gastrointestinal: Denies abdominal pain, nausea and vomiting. Neurological: No changes in mental status.   All other systems reviewed and are otherwise negative except as noted above.  Physical Exam    Blood pressure 115/67, pulse 90, temperature 97.7 F (36.5 C), resp. rate 17, SpO2 100 %.   General: Elderly, bloody post-operative mouth with guaze in place  Head: Normocephalic, atraumatic,  clear, moist mucus membranes. Neck: Negative for carotid bruits. + JVD Lungs:Clear to ausculation bilaterally. No wheezes, rales, or rhonchi. Breathing is unlabored. Cardiovascular: Irregularly irregular with S1 S2. No murmurs, rubs, gallops, or LV heave appreciated. Abdomen: Soft, non-tender, non-distended with normoactive bowel sounds. No obvious abdominal masses. MSK: Strength and tone appear normal for age. 5/5 in all extremities Extremities: 2+ BLE edema wthi SCD's in place. No clubbing or cyanosis. DP/PT pulses 1+ bilaterally Neuro: Alert and oriented. No focal deficits. No facial asymmetry. MAE spontaneously. Psych: Responds to questions appropriately with normal affect.    Labs    Troponin (Point of Care Test) No results for input(s): TROPIPOC in the last 72 hours. No results for input(s): CKTOTAL, CKMB, TROPONINI in the last 72 hours. Lab Results  Component Value Date   WBC 3.0 (L) 12/07/2017   HGB 8.7 (L) 12/07/2017   HCT 29.0 (L) 12/07/2017   MCV 105.1 (H) 12/07/2017   PLT 128 (L) 12/07/2017    Recent Labs  Lab 12/07/17 1037  NA 138  K 4.2  CL 93*  CO2 31  BUN 31*  CREATININE 1.44*  CALCIUM 9.8  PROT 7.4  BILITOT 1.4*  ALKPHOS 118  ALT 11  AST 21  GLUCOSE 99   Lab Results  Component Value Date   CHOL 104 08/24/2017   HDL 50.80 08/24/2017   LDLCALC 40 08/24/2017   TRIG 68.0 08/24/2017   No results found for: [facility]   Radiology Studies    No results found.  ECG & Cardiac Imaging    EKG: None this admission    Transesophageal echocardiography  October 23, 2017: ------------------------------------------------------------------- Study Conclusions  - Left ventricle: The cavity size was normal. Wall thickness was increased in a pattern of mild LVH. Systolic function was normal. The estimated ejection fraction was in the range of 55% to 60%. Wall motion was normal; there were no regional wall motion abnormalities. D-shaped septum consistent with RV pressure/volume overload. - Aortic valve: Mechanical aortic valve with no significant regurgitation, unable to obtain mean gradient as transgastric windows were poor. - Aorta: Normal caliber ascending aorta. - Mitral valve: The mitral valve was calcified and thickened, especially the posterior leaflet that was restricted. Mean gradient 5 mmHg but MVA by PHT >2 cm^2. I think there was only minimal mitral stenosis. There was severe mitral regurgitation, likely due to restriction of posterior leaflet and annular dilation from severe LAE (mixed functional and primary etiology). ERO 0.42 cm^2 by PISA. - Left atrium: The atrium was severely dilated. No evidence of thrombus in the atrial cavity or appendage. - Pulmonary veins: There was systolic flow reversal in the pulmonary vein doppler pattern. - Right ventricle: The cavity size was mildly dilated. Systolic function was mildly reduced. - Right atrium: The atrium was moderately dilated. - Atrial septum: No defect or patent foramen ovale was identified. - Tricuspid valve: Mild tricuspid regurgitation with peak RV-RA gradient 25 mmHg.  Impressions:  - Severe mitral regurgitation with minimal mitral stenosis (  suspect mildly elevated mean gradient with normal pressure hafl-time was due to high flow from mitral regurgitation). 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 Suspect mixed primary and secondary etiology.  Assessment & Plan    1. Chronic apical periodontitis s/p multiple extractions with gross  debridement of remaining dentition: -Pt presented to [facility] on 12[facility]12[facility]17 for elective procedure with the need to admit overnight for observation by cardiology team given his extensive history.  -Plan is to continue Amicar rinses postoperatively (14ml every hour for the next 10 hours in a swish and spit manner) -Oral Coumadin may be restarted this evening per pharmacy  -Lovenox therapy is to be held until tomorrow morning per MD OP note  -MD will round on pt in AM an re-evaluate when/if Lovenox restarted based on overnight bleeding -Monitor for increased pain, fever, chills -Will obtain CBCwith differential, BMET now and follow closely for excessive bleeding   2. 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  -Structural Heart team evaluated during last hospitalization and found not to be a Mitraclip candidate. Dr. [name] consulted and recommended aggressive medical therapy versus possible experimental percutaneous MVR as he felt very high risk for conventional surgery -Will continue current medical therapies and have close follow up with AHF and primary cardiology teams   3. 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 Severe MR could be contributing to the significant RV failure per chart note -Pt last seen in [facility] and torsemide increased to 100 mg BID with KCl 60 meq BID  -Will order strict I&O, daily weights -Given mild fluid volume overload on exam, will start IV Lasix 80mg  twice daily while inpatient  -Last office weight on 12/05/17 235lb  -BP stable   4. Permanent atrial fibrillation: -Rate controlled per telemetry review -Not  currently on AV blocking agents  -On home Coumadin however anticoagulation held secondary to #1 with instructions to restart Coumadin tonight per Dr. Drexel Iha and hold Lovenox until after reassessment in AM  -INR today, 1.20 -CHA2DS2VASc = 4  4. CAD s/p CABG: -Stable, no anginal symptoms -Continue Vytorin  5. COPD: -Stable, on continuous home O2 with humidification  -Monitor O2 saturations closely -Will continue home inhalers   6. OSA: -On Bipap QHS -Will not start secondary to dental extraction and bleeding  -Monitor O2 saturations closely   7. CKD Stage III: -Creatinine, from 12/07/17 1.44 -Baseline appears to be in the 1.3-1.4 range -Will check BMET and CBC today    8. DM2: -Will place on SSI for glucose control while inpatient and restart home regimen at discharge  Severity of Illness: The appropriate patient status for this patient is OBSERVATION. Observation status is judged to be reasonable and necessary in order to provide the required intensity of service to ensure the patient's safety. The patient's presenting symptoms, physical exam findings, and initial radiographic and laboratory data in the context of their medical condition is felt to place them at decreased risk for further clinical deterioration. Furthermore, it is anticipated that the patient will be medically stable for discharge from the hospital within 2 midnights of admission. The following factors support the patient status of observation.   " The patient's presenting symptoms include hx  of HF and CAD, post operative anticoagulation needs . " The physical exam findings include excessive volume, post operative anticoagulation needs . " The initial radiographic and laboratory data are post operative anticoagulation needs .   Signed, Kathyrn Drown NP-C [facility] Pager: [phone] 12/12/2017, @NOW   Patient seen with NP, agree with the above note.  He is s/p tooth extractions now with fairly copious  bleeding.  He has chronic diastolic CHF with severe MR.  He is volume overloaded on exam.   I will start him on Lasix 80 mg IV bid for now, would like to see some diuresis while here.   Tentatively start on warfarin tonight and back on Lovenox tomorrow for mechanical aortic valve and atrial fibrillation.  However, he is currently having a lot of bleeding.  Will resend CBC now.   Loralie Champagne 12/12/2017 4:49 PM

## 2017-12-12 NOTE — Op Note (Signed)
OPERATIVE REPORT  Patient:            [name] Date of Birth:  1936/04/01 MRN:                [id number]   DATE OF PROCEDURE:  12/12/2017  PREOPERATIVE DIAGNOSES: 1.  Severe mitral regurgitation 2.  Chronic anticoagulation 3.  Chronic apical periodontitis 4.  Dental caries 5.  Chronic periodontitis 6.  Loose teeth 7.  Accretions  POSTOPERATIVE DIAGNOSES: 1.  Severe mitral regurgitation 2.  Chronic anticoagulation 3.  Chronic apical periodontitis 4.  Dental caries 5.  Chronic periodontitis 6.  Loose teeth 7.  Accretions  OPERATIONS: 1. Multiple extraction of tooth numbers 1, 12, 13, 14, 15, 17, 18, 19, 29, and 30 2.  3 quadrants of alveoloplasty 3. Gross debridement of remaining dentition   SURGEON: Lenn Cal, DDS  ASSISTANT: Molli Posey (dental assistant)  ANESTHESIA: General anesthesia via oral endotracheal tube.  MEDICATIONS: 1. Ancef 2 g IV prior to invasive dental procedures. 2. Local anesthesia with a total utilization of 6 carpules each containing 34 mg of lidocaine with 0.017 mg of epinephrine as well as 2 carpules each containing 9 mg of bupivacaine with 0.009 mg of epinephrine.  SPECIMENS: There are 10 teeth that were discarded.  DRAINS: None  CULTURES: None  COMPLICATIONS: None  ESTIMATED BLOOD LOSS: 200 mLs.  INTRAVENOUS FLUIDS: 500 mLs of Lactated ringers solution and 250 mL's of albumin  INDICATIONS: The patient was recently diagnosed with severe mitral regurgitation.  A medically necessary dental consultation was then requested to evaluate poor dentition.  The patient was examined and treatment planned for multiple extractions with alveoloplasty, prosthetic surgery as needed, and gross debridement of remaining dentition in the operating room with general anesthesia..  This treatment plan was formulated to decrease the risks and complications associated with dental infection from affecting the patient's systemic health and risk  for endocarditis.  OPERATIVE FINDINGS: Patient was examined operating room number 18.  The teeth were identified for extraction.  It was determined that tooth numbers 1, 12, 13, 14, 15, 17, 18, 19, 29, and 30 would be extracted at this time.  After further evaluation, it was determined that the bilateral mandibular tori reductions would NOT be performed during this operating room procedure.  The patient was noted be affected by chronic apical periodontitis, dental caries, chronic periodontitis, accretions, and loose teeth.   DESCRIPTION OF PROCEDURE: Patient was brought to the main operating room number 18. Patient was then placed in the supine position on the operating table. General anesthesia was then induced per the anesthesia team. The patient was then prepped and draped in the usual manner for a Dental Medicine procedure. A timeout was performed. The patient was identified and procedures were verified. A throat pack was placed at this time. The oral cavity was then thoroughly examined with the findings noted above. The patient was then ready for dental medicine procedure as follows:  Local anesthesia was then administered sequentially with a total utilization of 6 carpules each containing 34 mg of lidocaine with 0.017 mg of epinephrine as well as 2 carpules each containing 9 mg bupivacaine with 0.009 mg of epinephrine.  The Maxillary left quadrant was first approached. Anesthesia was then delivered utilizing infiltration with lidocaine with epinephrine. A #15 blade incision was then made from the maxillary left tuberosity and extended to the mesial of #11.  A surgical flap was then carefully reflected.  Tooth numbers 12 through 15 were then  subluxated with a series of straight elevators.  A surgical handpiece and bur were then utilized to remove buccal and interseptal bone around tooth numbers 12 through 15.  The teeth were then again subluxated with a series of straight elevators.  Tooth numbers  12, 13 were then removed with a 150 forceps.  Tooth numbers 14 and 15 were then removed with a 53L forceps.  Alveoloplasty was then performed utilizing a ronguers and bone file to achieve primary closure. The surgical site was then irrigated with copious amounts of sterile saline. The tissues were approximated and trimmed appropriately.  A piece of Surgicel was then placed in the extraction sockets as needed.  The maxillary left surgical site was then closed from the maxillary left tuberosity and extended to the distal #11 utilizing 3-0 chromic gut suture in a continuous interrupted suture technique x1.    At this point time, the mandibular quadrants were approached. The patient was given bilateral inferior alveolar nerve blocks and long buccal nerve blocks utilizing the bupivacaine with epinephrine. Further infiltration was then achieved utilizing the lidocaine with epinephrine. A 15 blade incision was then made from the distal of number 17 extended to the mesial of #21.  A surgical flap was then carefully reflected.  Tooth #17 through 19 were then subluxated with a series of straight elevators.  The coronal aspect of tooth #17 and 18 were then removed with a 23 forceps leaving the roots remaining.  A surgical handpiece and bur were then used to remove buccal and interseptal bone around tooth numbers 17, 18, and 19.  The teeth were then further re-subluxated with a series of straight elevators.  Tooth #19 was then removed with a 23 forceps without complications.  The retained roots in the area of tooth #17 and 18 were then removed with a series of Cryer's elevators without further complication.  Alveoloplasty was then performed utilizing a ronguers and bone file to help achieve primary closure.  The surgical site was then irrigated with copious amounts sterile saline.  The tissues were approximated and trimmed appropriately.  A piece of Surgicel was placed in each extraction socket appropriately.  The surgical  site was then closed in the distal #17 extended to the distal #20 utilizing 3-0 chromic gut suture in a continuous interrupted suture technique x1.  One interproximal sutures placed between tooth numbers 20 and 21 with 3-0 chromic gut material.    At this point in time, the mandibular right quadrant was approached.  A 15 blade incision was then made from the mesial of #30 and extended to the mesial of #28.  Surgical flap was then carefully reflected.  Tooth numbers 29 and 30 were then subluxated with a series of straight elevators.  Tooth #29 was then removed with a 151 forceps without complication.  The coronal aspect of tooth #30 was then removed with a 23 forceps leaving the roots remaining.  Further bone was then removed around the retained root segments.  These root segments were then elevated out with a series of Cryer's elevators.  The sockets were then curetted and compressed appropriately.  Alveoloplasty was performed with a ronguers and bone file.  The surgical site was then irrigated with copious amounts sterile saline.  The tissues were approximated and trimmed appropriately.  The Surgicel was then placed in each extraction socket appropriately.  The surgical site was then closed from the mesial #31 extended the distal #28 utilizing 0 chromic gut suture in a continuous interrupted suture technique  x1.    At this point time the anesthesia team moved the oral endotracheal tube from the right side of the mouth to the left side.  The oral endotracheal tube was then secured by the anesthesia team.   At this point time tooth #1 was approached.  The tooth was elevated out with a series of elevators without complication.  The socket was then curetted and compressed appropriately.  The surgical site was then irrigated with copious amounts sterile saline.  A piece of Surgicel was placed the extraction socket.  The surgical site was then closed utilizing 3-0 chromic gut material in a figure-of-eight suture  technique x1.   At this point in time, the remaining dentition was approached.  A sonic scaler was used to remove accretions.  A series of hand curettes were then used to further remove accretions.  The Sonic scaler was then again used to refine the removal of Accretions.  This completed the gross debridement procedure.   At this point in time, the entire mouth was irrigated with copious amounts of sterile saline. The patient was examined for complications, seeing none, the dental medicine procedure was deemed to be complete. The throat pack was removed at this time. An oral airway was then placed at the request of the anesthesia team. A series of 4 x 4 gauze moistened with Amicar 5% rinse were placed in the mouth to aid hemostasis. The patient was then handed over to the anesthesia team for final disposition. After an appropriate amount of time, the patient was extubated and taken to the postanesthsia care unit in good condition. All counts were correct for the dental medicine procedure.  The patient is to be admitted for overnight observation by the cardiology team.  Patient is to continue the Amicar rinses postoperatively.  Patient is to rinse with 10 mils every hour for the next 10 hours in  a swish and spit manner.  The oral Coumadin therapy may be restarted this evening by the pharmacy team.  The Lovenox therapy is to be held until tomorrow morning.  This should not be restarted until I evaluate the extent of intraoral bleeding in the morning.    Lenn Cal, DDS.

## 2017-12-13 ENCOUNTER — Encounter (HOSPITAL_COMMUNITY): Payer: Self-pay | Admitting: Dentistry

## 2017-12-13 DIAGNOSIS — J9611 Chronic respiratory failure with hypoxia: Secondary | ICD-10-CM | POA: Diagnosis present

## 2017-12-13 DIAGNOSIS — D696 Thrombocytopenia, unspecified: Secondary | ICD-10-CM | POA: Diagnosis present

## 2017-12-13 DIAGNOSIS — E1151 Type 2 diabetes mellitus with diabetic peripheral angiopathy without gangrene: Secondary | ICD-10-CM | POA: Diagnosis present

## 2017-12-13 DIAGNOSIS — M109 Gout, unspecified: Secondary | ICD-10-CM | POA: Diagnosis present

## 2017-12-13 DIAGNOSIS — I6529 Occlusion and stenosis of unspecified carotid artery: Secondary | ICD-10-CM | POA: Diagnosis present

## 2017-12-13 DIAGNOSIS — I5033 Acute on chronic diastolic (congestive) heart failure: Secondary | ICD-10-CM | POA: Diagnosis not present

## 2017-12-13 DIAGNOSIS — I130 Hypertensive heart and chronic kidney disease with heart failure and stage 1 through stage 4 chronic kidney disease, or unspecified chronic kidney disease: Secondary | ICD-10-CM | POA: Diagnosis present

## 2017-12-13 DIAGNOSIS — K029 Dental caries, unspecified: Secondary | ICD-10-CM | POA: Diagnosis not present

## 2017-12-13 DIAGNOSIS — K045 Chronic apical periodontitis: Secondary | ICD-10-CM | POA: Diagnosis present

## 2017-12-13 DIAGNOSIS — Z7901 Long term (current) use of anticoagulants: Secondary | ICD-10-CM | POA: Diagnosis not present

## 2017-12-13 DIAGNOSIS — Z9981 Dependence on supplemental oxygen: Secondary | ICD-10-CM | POA: Diagnosis not present

## 2017-12-13 DIAGNOSIS — I2510 Atherosclerotic heart disease of native coronary artery without angina pectoris: Secondary | ICD-10-CM | POA: Diagnosis present

## 2017-12-13 DIAGNOSIS — E1122 Type 2 diabetes mellitus with diabetic chronic kidney disease: Secondary | ICD-10-CM | POA: Diagnosis present

## 2017-12-13 DIAGNOSIS — G4733 Obstructive sleep apnea (adult) (pediatric): Secondary | ICD-10-CM | POA: Diagnosis present

## 2017-12-13 DIAGNOSIS — K0889 Other specified disorders of teeth and supporting structures: Secondary | ICD-10-CM | POA: Diagnosis present

## 2017-12-13 DIAGNOSIS — Z951 Presence of aortocoronary bypass graft: Secondary | ICD-10-CM | POA: Diagnosis not present

## 2017-12-13 DIAGNOSIS — E876 Hypokalemia: Secondary | ICD-10-CM | POA: Diagnosis present

## 2017-12-13 DIAGNOSIS — E538 Deficiency of other specified B group vitamins: Secondary | ICD-10-CM | POA: Diagnosis present

## 2017-12-13 DIAGNOSIS — J449 Chronic obstructive pulmonary disease, unspecified: Secondary | ICD-10-CM | POA: Diagnosis present

## 2017-12-13 DIAGNOSIS — D631 Anemia in chronic kidney disease: Secondary | ICD-10-CM | POA: Diagnosis present

## 2017-12-13 DIAGNOSIS — I340 Nonrheumatic mitral (valve) insufficiency: Secondary | ICD-10-CM | POA: Diagnosis not present

## 2017-12-13 DIAGNOSIS — N183 Chronic kidney disease, stage 3 (moderate): Secondary | ICD-10-CM | POA: Diagnosis present

## 2017-12-13 DIAGNOSIS — I471 Supraventricular tachycardia: Secondary | ICD-10-CM | POA: Diagnosis not present

## 2017-12-13 DIAGNOSIS — I080 Rheumatic disorders of both mitral and aortic valves: Secondary | ICD-10-CM | POA: Diagnosis present

## 2017-12-13 DIAGNOSIS — I5043 Acute on chronic combined systolic (congestive) and diastolic (congestive) heart failure: Secondary | ICD-10-CM | POA: Diagnosis not present

## 2017-12-13 DIAGNOSIS — I482 Chronic atrial fibrillation: Secondary | ICD-10-CM | POA: Diagnosis not present

## 2017-12-13 DIAGNOSIS — D62 Acute posthemorrhagic anemia: Secondary | ICD-10-CM | POA: Diagnosis not present

## 2017-12-13 LAB — CBC
HCT: 23 % — ABNORMAL LOW (ref 39.0–52.0)
HEMATOCRIT: 22.8 % — AB (ref 39.0–52.0)
HEMOGLOBIN: 6.8 g/dL — AB (ref 13.0–17.0)
Hemoglobin: 6.7 g/dL — CL (ref 13.0–17.0)
MCH: 31.8 pg (ref 26.0–34.0)
MCH: 30.9 pg (ref 26.0–34.0)
MCHC: 29.8 g/dL — ABNORMAL LOW (ref 30.0–36.0)
MCHC: 29.1 g/dL — ABNORMAL LOW (ref 30.0–36.0)
MCV: 106.5 fL — AB (ref 78.0–100.0)
MCV: 106 fL — ABNORMAL HIGH (ref 78.0–100.0)
PLATELETS: 128 10*3/uL — AB (ref 150–400)
PLATELETS: 125 10*3/uL — AB (ref 150–400)
RBC: 2.14 MIL/uL — ABNORMAL LOW (ref 4.22–5.81)
RBC: 2.17 MIL/uL — AB (ref 4.22–5.81)
RDW: 18.1 % — ABNORMAL HIGH (ref 11.5–15.5)
RDW: 18.1 % — ABNORMAL HIGH (ref 11.5–15.5)
WBC: 6.6 10*3/uL (ref 4.0–10.5)
WBC: 6.3 10*3/uL (ref 4.0–10.5)

## 2017-12-13 LAB — BASIC METABOLIC PANEL
Anion gap: 9 (ref 5–15)
BUN: 34 mg/dL — AB (ref 8–23)
CO2: 30 mmol/L (ref 22–32)
CREATININE: 1.55 mg/dL — AB (ref 0.61–1.24)
Calcium: 8.9 mg/dL (ref 8.9–10.3)
Chloride: 98 mmol/L (ref 98–111)
GFR calc Af Amer: 47 mL/min — ABNORMAL LOW (ref >60)
GFR, EST NON AFRICAN AMERICAN: 41 mL/min — AB (ref >60)
GLUCOSE: 126 mg/dL — AB (ref 70–99)
POTASSIUM: 4.2 mmol/L (ref 3.5–5.1)
Sodium: 137 mmol/L (ref 135–145)

## 2017-12-13 LAB — GLUCOSE, CAPILLARY
GLUCOSE-CAPILLARY: 138 mg/dL — AB (ref 70–99)
GLUCOSE-CAPILLARY: 125 mg/dL — AB (ref 70–99)
Glucose-Capillary: 115 mg/dL — ABNORMAL HIGH (ref 70–99)
Glucose-Capillary: 169 mg/dL — ABNORMAL HIGH (ref 70–99)

## 2017-12-13 LAB — PROTIME-INR
INR: 1.33
INR: 1.4
PROTHROMBIN TIME: 17 s — AB (ref 11.4–15.2)
Prothrombin Time: 16.4 seconds — ABNORMAL HIGH (ref 11.4–15.2)

## 2017-12-13 LAB — PREPARE RBC (CROSSMATCH)

## 2017-12-13 MED ORDER — METOLAZONE 5 MG PO TABS
2.5000 mg | ORAL_TABLET | Freq: Once | ORAL | Status: AC
  Administered 2017-12-13: 2.5 mg via ORAL
  Filled 2017-12-13: qty 1

## 2017-12-13 MED ORDER — ENSURE ENLIVE PO LIQD
237.0000 mL | Freq: Two times a day (BID) | ORAL | Status: DC
  Administered 2017-12-13 – 2017-12-14 (×2): 237 mL via ORAL

## 2017-12-13 MED ORDER — SODIUM CHLORIDE 0.9% IV SOLUTION
Freq: Once | INTRAVENOUS | Status: AC
  Administered 2017-12-13: 11:00:00 via INTRAVENOUS

## 2017-12-13 MED ORDER — POTASSIUM CHLORIDE 20 MEQ PO PACK
60.0000 meq | PACK | Freq: Two times a day (BID) | ORAL | Status: DC
  Administered 2017-12-13 – 2017-12-14 (×3): 60 meq via ORAL
  Filled 2017-12-13 (×4): qty 3

## 2017-12-13 MED ORDER — IPRATROPIUM-ALBUTEROL 0.5-2.5 (3) MG/3ML IN SOLN: 3.0000 mL | Freq: Two times a day (BID) | RESPIRATORY_TRACT | Status: DC

## 2017-12-13 MED ORDER — SODIUM CHLORIDE 0.9% IV SOLUTION: Freq: Once | INTRAVENOUS | Status: DC

## 2017-12-13 MED ORDER — IPRATROPIUM-ALBUTEROL 0.5-2.5 (3) MG/3ML IN SOLN: 3.0000 mL | RESPIRATORY_TRACT | Status: DC | PRN

## 2017-12-13 MED ORDER — ALBUTEROL SULFATE (2.5 MG/3ML) 0.083% IN NEBU: 2.5000 mg | INHALATION_SOLUTION | RESPIRATORY_TRACT | Status: DC | PRN

## 2017-12-13 MED ORDER — AMINOCAPROIC ACID SOLUTION 5% (50 MG/ML)
10.0000 mL | ORAL | Status: DC
  Administered 2017-12-13 – 2017-12-14 (×13): 10 mL via ORAL
  Filled 2017-12-13: qty 100

## 2017-12-13 MED ORDER — WARFARIN SODIUM 7.5 MG PO TABS
7.5000 mg | ORAL_TABLET | Freq: Once | ORAL | Status: AC
  Administered 2017-12-13: 7.5 mg via ORAL
  Filled 2017-12-13: qty 1

## 2017-12-13 NOTE — Progress Notes (Signed)
Initial Nutrition Assessment  DOCUMENTATION CODES:   Not applicable  INTERVENTION:    Ensure Enlive po BID, each supplement provides 350 kcal and 20 grams of protein.  Advance to full liquids for lunch, RN to advance diet as tolerated to soft.  NUTRITION DIAGNOSIS:   Inadequate oral intake related to mouth pain as evidenced by per patient/family report.  GOAL:   Patient will meet greater than or equal to 90% of their needs  MONITOR:   Diet advancement, PO intake, Supplement acceptance  REASON FOR ASSESSMENT:   Consult Assessment of nutrition requirement/status  ASSESSMENT:   82 yo male with PMH of OSA on CPAP, A fib, CHF, CKD III, DM, CAD, COPD, and mitral regurgitation who was admitted on 9/11 for dental extractions.  S/P multiple tooth extractions 9/11.  Patient has gauze in his mouth this morning; difficult to obtain clear information from patient. He has lost weight recently, but he thinks it was from fluids r/t CHF. He is ready for full liquids. He tolerated a clear liquid breakfast well.  Labs and medications reviewed. CBG's: 115 this morning  NUTRITION - FOCUSED PHYSICAL EXAM:    Most Recent Value  Orbital Region  No depletion  Upper Arm Region  Mild depletion  Thoracic and Lumbar Region  No depletion  Buccal Region  Unable to assess  Temple Region  Mild depletion  Clavicle Bone Region  Mild depletion  Clavicle and Acromion Bone Region  Mild depletion  Scapular Bone Region  No depletion  Dorsal Hand  No depletion  Patellar Region  No depletion  Anterior Thigh Region  No depletion  Posterior Calf Region  Unable to assess  Edema (RD Assessment)  Unable to assess  Hair  Reviewed  Eyes  Reviewed  Mouth  Unable to assess  Skin  Reviewed  Nails  Reviewed       Diet Order:   Diet Order            Diet full liquid Room service appropriate? Yes with Assist; Fluid consistency: Thin  Diet effective now              EDUCATION NEEDS:   Education  needs have been addressed  Skin:  Skin Assessment: Reviewed RN Assessment  Last BM:  9/10  Height:   Ht Readings from Last 1 Encounters:  12/12/17 6\' 2"  (1.88 m)    Weight:   Wt Readings from Last 1 Encounters:  12/13/17 104.3 kg    Ideal Body Weight:  86.4 kg  BMI:  Body mass index is 29.53 kg/m.  Estimated Nutritional Needs:   Kcal:  2200-2400  Protein:  110-120 gm  Fluid:  2 L    Molli Barrows, RD, LDN, [place] Pager [phone] After Hours Pager [phone]

## 2017-12-13 NOTE — Progress Notes (Signed)
POST OPERATIVE NOTE:  12/13/2017 [name] [id number]  VITALS: BP (!) 100/53 (BP Location: Right Arm)   Pulse (!) 103   Temp 98.3 F (36.8 C) (Axillary)   Resp 13   Ht 6\' 2"  (1.88 m)   Wt 104.3 kg   SpO2 95%   BMI 29.53 kg/m   LABS:  Lab Results  Component Value Date   WBC 6.3 12/13/2017   HGB 6.7 (LL) 12/13/2017   HCT 23.0 (L) 12/13/2017   MCV 106.0 (H) 12/13/2017   PLT 125 (L) 12/13/2017   BMET    Component Value Date/Time   NA 137 12/13/2017 0603   NA 140 07/27/2017 1159   NA 140 01/19/2017 0827   K 4.2 12/13/2017 0603   K 4.8 01/19/2017 0827   CL 98 12/13/2017 0603   CO2 30 12/13/2017 0603   CO2 24 01/19/2017 0827   GLUCOSE 126 (H) 12/13/2017 0603   GLUCOSE 103 01/19/2017 0827   BUN 34 (H) 12/13/2017 0603   BUN 44 (H) 07/27/2017 1159   BUN 28.4 (H) 01/19/2017 0827   CREATININE 1.55 (H) 12/13/2017 0603   CREATININE 1.43 (H) 04/20/2017 0943   CREATININE 1.2 01/19/2017 0827   CALCIUM 8.9 12/13/2017 0603   CALCIUM 9.7 01/19/2017 0827   GFRNONAA 41 (L) 12/13/2017 0603   GFRNONAA 45 (L) 04/20/2017 0943   GFRAA 47 (L) 12/13/2017 0603   GFRAA 52 (L) 04/20/2017 0943    Lab Results  Component Value Date   INR 1.40 12/13/2017   INR 1.33 12/13/2017   INR 1.20 12/12/2017   No results found for: PTT   Jay Burak is status post multiple extractions with alveoloplasty and gross treatment of remaining dentition in the operating room on 12/12/2017.  Patient had persistent postoperative bleeding after the dental extraction procedures.  Current CBC and INR's were reviewed.  Patient now seen to evaluate postoperative bleeding and consider postoperative transfusions as indicated.  SUBJECTIVE: Patient with minimal postoperative discomfort.  Patient has significant postoperative bleeding after the dental extraction procedures.  Patient denies having any active bleeding at this time, however.  EXAM: Patient had extensive gauze in the mouth.  Minimal heme is noted  on these gauze that have been in the mouth for approximately 1 hour by nurse report.  There is no sign of oral infection or ooze.  Heme in the mouth is noted.  Sutures are intact.  Clots are present.  Extraoral and intraoral ecchymoses are noted.  ASSESSMENT: Post operative course is complicated by the postoperative bleeding and use of anticoagulants. Decreased hemoglobin/hematocrit with plan for transfusion with 1 unit of packed red blood cells ordered by cardiology team and 2 units of FFP ordered by dental medicine to assist in maintenance of hemostasis. Loss of teeth due to extraction   PLAN: 1.  Transfusion with packed red blood cells and FFP as above. 2.  Continue use of Amicar 5% rinses every hour for 10 hours and then as needed for persistent oozing. 3.  Maintain use of 4 x 4 gauze to extraction sites with pressure as needed to control bleeding.   4.  Consider holding warfarin therapy this evening if bleeding is still present. 5.  Advance diet as tolerated up to soft foods only per dietary consultation.   Lenn Cal, DDS

## 2017-12-13 NOTE — Progress Notes (Signed)
Pt experiencing significant bleeding at tooth extraction sites. Confirmed with on-call MD that it is ok to give Coumadin tonight. Spoke with pharmacy to confirm that dose is appropriate for patient condition.  Pharmacy verified that scheduled dose is OK to give.

## 2017-12-13 NOTE — Progress Notes (Addendum)
Advanced Heart Failure Rounding Note  PCP-Cardiologist: [name], MD   Subjective:    Feeling Ok this am. Pain well controlled on "lowest" dose of pain meds. Denies SOB. Remains swollen into thighs. Bleeding now well controlled.   Hgb 6.7 this am. Ordered for 2 units RBC and 1 FFP. INR 1.4.   Objective:   Weight Range: 104.3 kg Body mass index is 29.53 kg/m.   Vital Signs:   Temp:  [97.5 F (36.4 C)-98.7 F (37.1 C)] 98.3 F (36.8 C) (09/12 0758) Pulse Rate:  [80-108] 103 (09/12 0758) Resp:  [12-21] 13 (09/12 0758) BP: (100-132)/(53-80) 100/53 (09/12 0758) SpO2:  [86 %-100 %] 95 % (09/12 0824) Weight:  [104.3 kg-105.3 kg] 104.3 kg (09/12 0635) Last BM Date: 12/11/17  Weight change: Filed Weights   12/12/17 1751 12/13/17 0635  Weight: 105.3 kg 104.3 kg    Intake/Output:   Intake/Output Summary (Last 24 hours) at 12/13/2017 0839 Last data filed at 12/13/2017 7106 Gross per 24 hour  Intake 2210 ml  Output 1675 ml  Net 535 ml      Physical Exam    General:  Elderly. NAD  HEENT: Normal; buccal ecchymosis. Neck: Supple. JVP elevated. Carotids 2+ bilat; no bruits. No lymphadenopathy or thyromegaly appreciated. Cor: PMI nondisplaced. Irregularly irregular. Mechanical S1.  Lungs: Clear Abdomen: Soft, nontender, nondistended. No hepatosplenomegaly. No bruits or masses. Good bowel sounds. Extremities: No cyanosis, clubbing, or rash. 1-2+ BLE edema.  Neuro: Alert & orientedx3, cranial nerves grossly intact. moves all 4 extremities w/o difficulty. Affect pleasant   Telemetry   Afib 90-100s, personally reviewed (Chronic)  EKG    No new tracings.    Labs    CBC Recent Labs    12/12/17 1854 12/13/17 0603 12/13/17 0724  WBC 7.4 6.6 6.3  NEUTROABS 6.4  --   --   HGB 7.5* 6.8* 6.7*  HCT 25.1* 22.8* 23.0*  MCV 106.8* 106.5* 106.0*  PLT 124* 128* 269*   Basic Metabolic Panel Recent Labs    12/12/17 1854 12/13/17 0603  NA 138 137  K 4.5 4.2   CL 96* 98  CO2 28 30  GLUCOSE 172* 126*  BUN 34* 34*  CREATININE 1.57* 1.55*  CALCIUM 9.2 8.9   Liver Function Tests Recent Labs    12/12/17 1854  AST 22  ALT 10  ALKPHOS 100  BILITOT 1.4*  PROT 6.7  ALBUMIN 3.4*   No results for input(s): LIPASE, AMYLASE in the last 72 hours. Cardiac Enzymes No results for input(s): CKTOTAL, CKMB, CKMBINDEX, TROPONINI in the last 72 hours.  BNP: BNP (last 3 results) Recent Labs    08/01/17 1804 10/18/17 1510  BNP 784.4* 1,073.3*    ProBNP (last 3 results) Recent Labs    03/30/17 1147 05/25/17 1044 07/10/17 1523  PROBNP 1,858* 2,749* 411.0*     D-Dimer No results for input(s): DDIMER in the last 72 hours. Hemoglobin A1C No results for input(s): HGBA1C in the last 72 hours. Fasting Lipid Panel No results for input(s): CHOL, HDL, LDLCALC, TRIG, CHOLHDL, LDLDIRECT in the last 72 hours. Thyroid Function Tests No results for input(s): TSH, T4TOTAL, T3FREE, THYROIDAB in the last 72 hours.  Invalid input(s): FREET3  Other results:   Imaging     No results found.   Medications:     Scheduled Medications: . sodium chloride   Intravenous Once  . sodium chloride   Intravenous Once  . allopurinol  100 mg Oral Daily  .  docusate sodium  100 mg Oral BID  . ezetimibe-simvastatin  1 tablet Oral QHS  . furosemide  80 mg Intravenous BID  . insulin aspart  0-15 Units Subcutaneous TID WC  . pantoprazole  40 mg Oral Daily  . potassium chloride SA  60 mEq Oral BID  . sodium chloride flush  3 mL Intravenous Q12H  . Warfarin - Pharmacist Dosing Inpatient   Does not apply q1800     Infusions: . sodium chloride    . lactated ringers       PRN Medications:  sodium chloride, acetaminophen, ipratropium-albuterol, morphine injection, ondansetron (ZOFRAN) IV, oxyCODONE-acetaminophen, sodium chloride flush    Patient Profile   [name] a 82yo Mwith h/o mechanical AVR 1994, OSA on CPAP, permanent atrial  fibrillation on chronic coumadin therapy, chronic combined CHF followed by [facility], CKD III, DM, CAD s/p CAGB 1994 and recently diagnosed COPD and severe mitral regurgitation who presented to [facility] on 12/12/17 for elective dental extraction for chronic apical periodontitis per Dr. Enrique Sack.  Assessment/Plan   1. Chronic apical periodontitis s/p multiple extractions with gross debridement of remaining dentition: -Pt presented [facility] on 12/12/17 for elective procedure with the need to admit overnight for observation by cardiology team given his extensive history.  -Plan is to continue Amicar rinses postoperatively (18ml every hour for the next 10 hours ina swish and spit manner) -Oral Coumadin may be restarted this evening per pharmacy  - With bleeding overnight and anemia this am. Ordered for 2 units of pRBCs.  - Will wait on timing of lovenox per Dr. Alexander Bergeron.   2. 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  - Structural Heart team evaluated during last hospitalization and found not to be a Mitraclip candidate. Dr. [name] consulted and recommended aggressive medical therapy versus possible experimental percutaneous MVR as he felt very high risk for conventional surgery - INR as below.   3. 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 Severe MR could be contributing to the significant RV failure per chart note -Pt last seen in [facility] and torsemide increased to 100 mg BID with KCl 60 meq BID  - Continue strict I&O, daily weights - Continue IV lasix 80 mg BID today. To get blood today.  - Last office weight on 12/05/17 235lb  - BP  stable.   4. Permanent atrial fibrillation: - Rate controlled per telemetry review - Not currently on AV blocking agents  - INR 1.40 today. Will need to restart lovenox when stable with mechanical valve. Will discuss with Dr. Enrique Sack.  - CHA2DS2VASc = 4  4. CAD s/p CABG: - No s/s of ischemia.    - Continue Vytorin  5. COPD: - Stable, on continuous home O2 with humidification  - Monitor O2 saturations closely - Continue home meds.   6. OSA: - Can resume Bipap QHS once mouth stable.  - Will not start secondary to dental extraction and bleeding  - Monitor O2 saturations closely   7. CKD Stage III: -Cr 1.55, near baseline. Follow.   8. DM2: - SSI while in house.   9. Post-Op Anemia - Hgb 6.7 this am - Ordered for 2 uPRBCs and 2  FFP. Discussed with Dr. [name], will cancel FFP in setting of mechanical valve.  - Also ordered for IV lasix.   Medication concerns reviewed with patient and pharmacy team. Barriers identified: None at this time.   Length of Stay: 0  Annamaria Helling  12/13/2017, 8:39 AM  Advanced Heart Failure Team Pager [phone] (M-F; 7a - 4p)  Please contact [facility] for night-coverage after hours (4p -7a ) and weekends on [email or web address]  Patient seen with PA, agree with the above note.  He had significant bleeding with tooth removals yesterday and hgb 6.7 today.  INR 1.33, creatinine stable at 1.55.  IV Lasix started.   On exam, JVP 12-14 cm.  Irregular S1S2 with 2/6 HSM apex. Mechanical S2.  Clear lungs. 2+ edema to knees.   1. S/p dental surgery: Bleeding in mouth appears to have stopped.  Will not give FFP.  INR 1.33.   - As long as no further bleeding, will give warfarin again tonight.  Hold off on Lovenox until tomorrow.  2. Acute on chronic diastolic CHF with prominent RV failure:  Echo most recently showed EF 55-65%, mechanical aortic valve functioning normally, mild mitral stenosis/severe mitral regurgitation, RV moderately dilated  with moderately decreased systolic function, PASP 49 mmHg.  Severe MR likely contributing to the significant RV failure.  He is volume overloaded on exam today.  - Continue Lasix 80 mg IV bid and will give a dose of metolazone 2.5 x 1.  3. Mechanical aortic valve: Appears to function well on last echo.  - Restarted warfarin with INR goal 2.5-3.5 (also with atrial fibrillation).  - Hold off on Lovenox for now with recent bleeding/anemia. Possible restart tomorrow if no more bleeding.   4. Atrial fibrillation: Chronic. Rate controlled 70s. No change.  5. CAD: s/p CABG.  No CP.   - Continue Vytorin.  6. COPD: On home oxygen. No change.  7. OSA: Continue home CPAP. No change.  8. Acute on CKD: Stage 3.  Creatinine stable 1.55.  9. Anemia: Baseline anemia chronic disease/renal disease, worse with blood loss with dental surgery.  - Transfusing 2 units PRBCs today.  - Continue Protonix.  10. Deconditioning: Out of bed, PT.   11. Thrombocytopenia: This appears chronic, had before this admission. No change 12. 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 Structural Heart team evaluated. It does not appear that he will be a Mitraclip candidate. He was seen by Dr. [name] who recommended aggressive medical therapy versus possible experimental percutaneous MVR as he felt very high risk for conventional surgery. No change.   Loralie Champagne 12/13/2017

## 2017-12-13 NOTE — Plan of Care (Signed)
Hgb/Hct = 6.7/23.  2 units PRBC transfused. Bleeding from extraction sites minimal.

## 2017-12-13 NOTE — Progress Notes (Signed)
Pt with further drop in Hgb, will recheck CBC and have ordered type and hold RBCs.  Pt BP stable and pulse.  Will notify HF team.

## 2017-12-13 NOTE — Progress Notes (Signed)
Notified by telemetry of 22 beat run of [place]. Pt remains asymptomatic. On call MD notified.  MD also notified on continued significant bleeding at tooth extraction sites requiring frequent dressing changes. Awaiting orders from MD. Will continue to monitor patient status closely.

## 2017-12-13 NOTE — Progress Notes (Addendum)
Addendum: Discussed with heart failure team this afternoon and they asked for coumadin to be given tonight. Will repeat 7.5mg  dose again tonight but will follow INR closely in am. Will hold off on heparin/lovenox bridge today.  12/13/2017 1:21 PM   ANTICOAGULATION CONSULT NOTE   Pharmacy Consult for warfarin Indication: mechanical aortic valve/atrial fibrillation  Allergies  Allergen Reactions  . Rifampin Rash    May have been caused by Vancomycin or Rifampin (??)  . Vancomycin Rash    May have been caused by Vancomycin or Rifampin (??)    Patient Measurements: Height: 6\' 2"  (188 cm) Weight: 230 lb (104.3 kg) IBW/kg (Calculated) : 82.2 Heparin Dosing Weight: 103.5 kg  Vital Signs: Temp: 98.1 F (36.7 C) (09/12 1045) Temp Source: Axillary (09/12 1045) BP: 108/64 (09/12 1048) Pulse Rate: 92 (09/12 1048)  Labs: Recent Labs    12/12/17 1610  12/12/17 1854 12/13/17 0603 12/13/17 0724  HGB  --    < > 7.5* 6.8* 6.7*  HCT  --   --  25.1* 22.8* 23.0*  PLT  --   --  124* 128* 125*  LABPROT 15.1  --   --  16.4* 17.0*  INR 1.20  --   --  1.33 1.40  CREATININE  --   --  1.57* 1.55*  --    < > = values in this interval not displayed.    Estimated Creatinine Clearance: 48.9 mL/min (A) (by C-G formula based on SCr of 1.55 mg/dL (H)).   Medical History: Past Medical History:  Diagnosis Date  . Carotid artery disease ([place]) 1994   s/p left carotid endarerectomy   . Chronic atrial fibrillation (HCC)    a. on coumadin   . Chronic diastolic CHF (congestive heart failure) ([place])   . Diabetes mellitus without complication ([place])    dx 2016  . Dyspnea   . Heart murmur   . Hx of CABG    a. 1994  . Hypertension   . OSA (obstructive sleep apnea)   . Rheumatic fever   . S/P AVR (aortic valve replacement)    a. mechanical valve 1996  . Subclavian bypass stenosis ([place])   . 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 Vitamin  B12 deficiency    Assessment: 50 yof with h/o mechanical AVR in 1994 presented for elective dental extraction for chronic apical periodontitis. On warfarin for MVR/AFib PTA - was being bridged with enoxaparin while warfarin on hold for procedure (LD enox on 9/10).   S/p dental surgery 9/11. Patient had significant bleeding yesterday and overnight, required transfusion this am. Bleeding and oozing is however much improved this morning. Hgb was down to 6.7 prior to transfusion, FFP was cancelled. Warfarin resumed last night and INR is trending up 1.2>1.4, pltc low at baseline 120s.   Given anemia and bleeding will plan on holding warfarin tonight per Dentistry, will check with cardiology if this is ok with them also. Expect INR to continue to trend up tomorrow.   Home regimen is 5 mg daily except 7.5 mg on MWF.  Goal of Therapy:  INR 2.5-3.5 Monitor platelets by anticoagulation protocol: Yes   Plan:  Will discuss with cardiology but will plan on holding warfarin tonight.  Monitor daily INR, CBC, for s/sx of bleeding F/u plan for enoxaparin/heparin bridge  Erin Hearing PharmD., BCPS Clinical Pharmacist 12/13/2017 11:26 AM

## 2017-12-14 DIAGNOSIS — I5033 Acute on chronic diastolic (congestive) heart failure: Secondary | ICD-10-CM

## 2017-12-14 DIAGNOSIS — I340 Nonrheumatic mitral (valve) insufficiency: Secondary | ICD-10-CM

## 2017-12-14 DIAGNOSIS — Z7901 Long term (current) use of anticoagulants: Secondary | ICD-10-CM

## 2017-12-14 LAB — BPAM FFP
Blood Product Expiration Date: 201909172359
Blood Product Expiration Date: 201909172359
Unit Type and Rh: 5100
Unit Type and Rh: 5100

## 2017-12-14 LAB — TYPE AND SCREEN
ABO/RH(D): O NEG
Antibody Screen: NEGATIVE
UNIT DIVISION: 0
Unit division: 0

## 2017-12-14 LAB — PREPARE FRESH FROZEN PLASMA
UNIT DIVISION: 0
Unit division: 0

## 2017-12-14 LAB — BASIC METABOLIC PANEL
Anion gap: 11 (ref 5–15)
BUN: 32 mg/dL — ABNORMAL HIGH (ref 8–23)
CO2: 29 mmol/L (ref 22–32)
Calcium: 8.7 mg/dL — ABNORMAL LOW (ref 8.9–10.3)
Chloride: 95 mmol/L — ABNORMAL LOW (ref 98–111)
Creatinine, Ser: 1.29 mg/dL — ABNORMAL HIGH (ref 0.61–1.24)
GFR calc Af Amer: 59 mL/min — ABNORMAL LOW (ref >60)
GFR calc non Af Amer: 51 mL/min — ABNORMAL LOW (ref >60)
GLUCOSE: 116 mg/dL — AB (ref 70–99)
POTASSIUM: 3.3 mmol/L — AB (ref 3.5–5.1)
Sodium: 135 mmol/L (ref 135–145)

## 2017-12-14 LAB — BPAM RBC
BLOOD PRODUCT EXPIRATION DATE: 201910062359
BLOOD PRODUCT EXPIRATION DATE: 201910092359
ISSUE DATE / TIME: 201909121021
ISSUE DATE / TIME: 201909121407
UNIT TYPE AND RH: 9500
Unit Type and Rh: 9500

## 2017-12-14 LAB — CBC
HEMATOCRIT: 24.3 % — AB (ref 39.0–52.0)
Hemoglobin: 7.6 g/dL — ABNORMAL LOW (ref 13.0–17.0)
MCH: 31.3 pg (ref 26.0–34.0)
MCHC: 31.3 g/dL (ref 30.0–36.0)
MCV: 100 fL (ref 78.0–100.0)
PLATELETS: 105 10*3/uL — AB (ref 150–400)
RBC: 2.43 MIL/uL — AB (ref 4.22–5.81)
RDW: 19.3 % — ABNORMAL HIGH (ref 11.5–15.5)
WBC: 5.5 10*3/uL (ref 4.0–10.5)

## 2017-12-14 LAB — PROTIME-INR
INR: 1.63
Prothrombin Time: 19.2 seconds — ABNORMAL HIGH (ref 11.4–15.2)

## 2017-12-14 LAB — MAGNESIUM: Magnesium: 1.9 mg/dL (ref 1.7–2.4)

## 2017-12-14 LAB — GLUCOSE, CAPILLARY
GLUCOSE-CAPILLARY: 130 mg/dL — AB (ref 70–99)
Glucose-Capillary: 121 mg/dL — ABNORMAL HIGH (ref 70–99)

## 2017-12-14 MED ORDER — METOLAZONE 2.5 MG PO TABS: ORAL_TABLET | ORAL | 3 refills | Status: DC

## 2017-12-14 MED ORDER — SODIUM CHLORIDE 0.9 % IV SOLN
510.0000 mg | Freq: Once | INTRAVENOUS | Status: AC
  Administered 2017-12-14: 510 mg via INTRAVENOUS
  Filled 2017-12-14: qty 17

## 2017-12-14 MED ORDER — METOLAZONE 5 MG PO TABS
2.5000 mg | ORAL_TABLET | Freq: Once | ORAL | Status: AC
  Administered 2017-12-14: 2.5 mg via ORAL
  Filled 2017-12-14: qty 1

## 2017-12-14 MED ORDER — AMIODARONE HCL 200 MG PO TABS: 200.0000 mg | ORAL_TABLET | Freq: Two times a day (BID) | ORAL | 3 refills | Status: DC

## 2017-12-14 MED ORDER — WARFARIN SODIUM 5 MG PO TABS: ORAL_TABLET | ORAL | 0 refills | Status: DC

## 2017-12-14 MED ORDER — POTASSIUM CHLORIDE CRYS ER 20 MEQ PO TBCR
40.0000 meq | EXTENDED_RELEASE_TABLET | Freq: Once | ORAL | Status: AC
  Administered 2017-12-14: 40 meq via ORAL
  Filled 2017-12-14: qty 2

## 2017-12-14 MED ORDER — ENOXAPARIN SODIUM 100 MG/ML ~~LOC~~ SOLN: 100.0000 mg | Freq: Two times a day (BID) | SUBCUTANEOUS | 1 refills | Status: DC

## 2017-12-14 MED ORDER — ENOXAPARIN SODIUM 100 MG/ML ~~LOC~~ SOLN
100.0000 mg | Freq: Two times a day (BID) | SUBCUTANEOUS | Status: DC
  Administered 2017-12-14: 100 mg via SUBCUTANEOUS
  Filled 2017-12-14: qty 1

## 2017-12-14 MED ORDER — WARFARIN SODIUM 4 MG PO TABS: 8.0000 mg | ORAL_TABLET | Freq: Once | ORAL | Status: DC

## 2017-12-14 MED ORDER — ACETAMINOPHEN 325 MG PO TABS: 650.0000 mg | ORAL_TABLET | ORAL | Status: DC | PRN

## 2017-12-14 MED ORDER — AMIODARONE HCL 200 MG PO TABS
200.0000 mg | ORAL_TABLET | Freq: Two times a day (BID) | ORAL | Status: DC
  Administered 2017-12-14: 200 mg via ORAL
  Filled 2017-12-14: qty 1

## 2017-12-14 NOTE — Evaluation (Signed)
Physical Therapy Evaluation Patient Details Name: [name] MRN: [id number] DOB: 1935-07-26 Today's Date: 12/14/2017   History of Present Illness  Pt adm after multiple dental extractions with post op anemia and volume overload.  PMH - chf, mechanical aortic valve (1994), OSA on CPAP, a-fib, HF, CKD III,  prior CABG, and COPD  Clinical Impression  Pt doing well with mobility. Ready for return home with wife and resume HHPT.    Follow Up Recommendations Home health PT    Equipment Recommendations  None recommended by PT    Recommendations for Other Services       Precautions / Restrictions Precautions Precautions: Fall Restrictions Weight Bearing Restrictions: No      Mobility  Bed Mobility Overal bed mobility: Needs Assistance Bed Mobility: Supine to Sit;Sit to Supine     Supine to sit: Min assist;HOB elevated Sit to supine: Supervision   General bed mobility comments: assist to elevate trunk into sitting  Transfers Overall transfer level: Needs assistance Equipment used: 4-wheeled walker Transfers: Sit to/from Stand Sit to Stand: Supervision         General transfer comment: supervision for safety  Ambulation/Gait Ambulation/Gait assistance: Supervision Gait Distance (Feet): 250 Feet Assistive device: 4-wheeled walker Gait Pattern/deviations: Step-through pattern;Decreased stride length     General Gait Details: amb on 2L of O2. Supervision for safety.   Stairs            Wheelchair Mobility    Modified Rankin (Stroke Patients Only)       Balance Overall balance assessment: Needs assistance Sitting-balance support: No upper extremity supported;Feet supported Sitting balance-Leahy Scale: Good     Standing balance support: Bilateral upper extremity supported Standing balance-Leahy Scale: Poor Standing balance comment: UE support                             Pertinent Vitals/Pain Pain Assessment: Faces Faces Pain Scale:  Hurts little more Pain Location: bilateral feet Pain Descriptors / Indicators: Sore Pain Intervention(s): Limited activity within patient's tolerance;Monitored during session    Home Living Family/patient expects to be discharged to:: Private residence Living Arrangements: Spouse/significant other Available Help at Discharge: Family;Available 24 hours/day Type of Home: House Home Access: Stairs to enter Entrance Stairs-Rails: None Entrance Stairs-Number of Steps: 1 Home Layout: Two level;Able to live on main level with bedroom/bathroom Home Equipment: Grab bars - toilet;Walker - 4 wheels;Bedside commode;Shower seat - built Investment banker, operational      Prior Function Level of Independence: Needs assistance   Gait / Transfers Assistance Needed: amb in house without assistive device at times. Uses rollator in the community           Hand Dominance   Dominant Hand: Right    Extremity/Trunk Assessment   Upper Extremity Assessment Upper Extremity Assessment: Generalized weakness    Lower Extremity Assessment Lower Extremity Assessment: Generalized weakness       Communication   Communication: No difficulties  Cognition Arousal/Alertness: Awake/alert Behavior During Therapy: WFL for tasks assessed/performed Overall Cognitive Status: Within Functional Limits for tasks assessed                                        General Comments      Exercises     Assessment/Plan    PT Assessment All further PT needs can be met in the next venue of care  PT Problem List Decreased strength;Decreased balance;Decreased mobility       PT Treatment Interventions      PT Goals (Current goals can be found in the Care Plan section)  Acute Rehab PT Goals PT Goal Formulation: All assessment and education complete, DC therapy    Frequency     Barriers to discharge        Co-evaluation               AM-PAC PT "6 Clicks" Daily Activity  Outcome Measure  Difficulty turning over in bed (including adjusting bedclothes, sheets and blankets)?: A Little Difficulty moving from lying on back to sitting on the side of the bed? : A Little Difficulty sitting down on and standing up from a chair with arms (e.g., wheelchair, bedside commode, etc,.)?: A Little Help needed moving to and from a bed to chair (including a wheelchair)?: A Little Help needed walking in hospital room?: A Little Help needed climbing 3-5 steps with a railing? : A Little 6 Click Score: 18    End of Session Equipment Utilized During Treatment: Gait belt;Oxygen Activity Tolerance: Patient tolerated treatment well Patient left: in bed;with call bell/phone within reach;with nursing/sitter in room Nurse Communication: Mobility status;Other (comment)(condom cath leaking) PT Visit Diagnosis: Other abnormalities of gait and mobility (R26.89);Muscle weakness (generalized) (M62.81)    Time: 2409-7353 PT Time Calculation (min) (ACUTE ONLY): 21 min   Charges:   PT Evaluation $PT Eval Moderate Complexity: [place] Pager [phone] Office [place] 12/14/2017, 12:56 PM

## 2017-12-14 NOTE — Care Management Note (Signed)
Case Management Note  Patient Details  Name: [name] MRN: [id number] Date of Birth: 1935/08/28  Subjective/Objective:  Pt presented for regular follow up visit-Severe mitral regurgitation. Dental Carries-10 teeth extracted.  PTA from home with the support of wife. Pt has DME 02, RW and 3n1 in the home. Plan will be to return home once stable. Pt was previously active with [facility] for RN/PT Services- Pt will need Resumption Orders for RN/PT and F2F.               Action/Plan: Brad with [facility] is aware that patient is hospitalized and SOC to begin within 24-48 hours post transition home. No further needs from CM at this time.   Expected Discharge Date:                  Expected Discharge Plan:  [place]  In-House Referral:  NA  Discharge planning Services  CM Consult  Post Acute Care Choice:  Home Health, Resumption of Svcs/PTA Provider Choice offered to:  Patient  DME Arranged:  N/A DME Agency:  NA  HH Arranged:  RN, Disease Management, PT [place] Agency:  [place]  Status of Service:  Completed, signed off  If discussed at [place] of Stay Meetings, dates discussed:    Additional Comments:  Bethena Roys, RN 12/14/2017, 10:03 AM

## 2017-12-14 NOTE — Anesthesia Postprocedure Evaluation (Signed)
Anesthesia Post Note  Patient: Croy Drumwright  Procedure(s) Performed: Extraction of tooth #'s 1, 12,13,14,15,17,18,19, 29, and 30 with alveoloplasty and gross debridement of remaining teeth` (Bilateral Mouth)     Patient location during evaluation: PACU Anesthesia Type: General Level of consciousness: awake and alert Pain management: pain level controlled Vital Signs Assessment: post-procedure vital signs reviewed and stable Respiratory status: spontaneous breathing, nonlabored ventilation, respiratory function stable and patient connected to nasal cannula oxygen Cardiovascular status: blood pressure returned to baseline and stable Postop Assessment: no apparent nausea or vomiting Anesthetic complications: no    Last Vitals:  Vitals:   12/13/17 2047 12/14/17 0357  BP: 113/67 (!) 98/54  Pulse: (!) 102 94  Resp: 17 17  Temp: 37 C 36.7 C  SpO2: 91% 93%    Last Pain:  Vitals:   12/14/17 0357  TempSrc: Axillary  PainSc:                  Chuong Casebeer

## 2017-12-14 NOTE — Discharge Summary (Addendum)
Advanced Heart Failure Discharge Note  Discharge Summary   Patient ID: [name] MRN: [id number], DOB/AGE: [birth year] 82 y.o. Admit date: 12/12/2017 D/C date:     12/14/2017   Primary Discharge Diagnoses:  1. Chronic apical periodontitis s/p multiple extractions with gross debridement of remaining dentition: 2. S/p MVR 1994, now with newly diagnosed severe MR per echo: 3. Chronic diastolic CHF with prominent RV failure: 4. Permanent atrial fibrillation: 4. CAD s/p CABG: 5. COPD: 80. OSA: 7. CKD Stage III: 8. DM2: 9. Post-Op Anemia 10. Hypokalemia 11. Run of VT  Hospital Course:   [name] is a 82 y.o. malewith h/o mechanical AVR 1994, OSA on CPAP, permanent atrial fibrillation on chronic coumadin therapy, chronic combined CHF followed by [facility], CKD III, DM, CAD s/p CAGB 1994 and recently diagnosed COPD and severe mitral regurgitation who presented to [facility] on 12/12/17 for elective dental extraction for chronic apical periodontitis per Dr. Enrique Sack.  Admitted 12/12/17 for planned procedure. Coumadin had been held prior to admission.  Hospital course complicated by  Volume overload and post op anemia. Given IV lasix, metolazone, and 2 u PRBCs. Also given dose of feraheme 12/14/17.  Hospital course complicated by run of VT in the setting of hypokalemia. Supped, and started on amiodarone with h/o of arrhythmia. Will follow labs closely.  Examined am of 12/14/17 and determined stable for discharge home with very close follow up as below.   HH: Please check INR and send to coumadin clinic 12/17/17 per usual protocol. Please check BMET and send to HF clinic as ordered.   Discharge Weight Range: 230.4 lbs Discharge Vitals: Blood pressure (!) 98/54, pulse 94, temperature 98.1 F (36.7 C), temperature source Axillary, resp. rate 17, height 6\' 2"  (1.88 m), weight 104.5 kg, SpO2 93 %.  Labs: Lab Results  Component Value Date   WBC 5.5 12/14/2017   HGB 7.6 (L) 12/14/2017   HCT 24.3  (L) 12/14/2017   MCV 100.0 12/14/2017   PLT 105 (L) 12/14/2017    Recent Labs  Lab 12/12/17 1854  12/14/17 0412  NA 138   < > 135  K 4.5   < > 3.3*  CL 96*   < > 95*  CO2 28   < > 29  BUN 34*   < > 32*  CREATININE 1.57*   < > 1.29*  CALCIUM 9.2   < > 8.7*  PROT 6.7  --   --   BILITOT 1.4*  --   --   ALKPHOS 100  --   --   ALT 10  --   --   AST 22  --   --   GLUCOSE 172*   < > 116*   < > = values in this interval not displayed.   Lab Results  Component Value Date   CHOL 104 08/24/2017   HDL 50.80 08/24/2017   LDLCALC 40 08/24/2017   TRIG 68.0 08/24/2017   BNP (last 3 results) Recent Labs    08/01/17 1804 10/18/17 1510  BNP 784.4* 1,073.3*    ProBNP (last 3 results) Recent Labs    03/30/17 1147 05/25/17 1044 07/10/17 1523  PROBNP 1,858* 2,749* 411.0*     Diagnostic Studies/Procedures   No results found.  Discharge Medications   Allergies as of 12/14/2017      Reactions   Rifampin Rash   May have been caused by Vancomycin or Rifampin (??)   Vancomycin Rash   May have been caused by Vancomycin or Rifampin (??)  Medication List    TAKE these medications   acetaminophen 325 MG tablet Commonly known as:  TYLENOL Take 2 tablets (650 mg total) by mouth every 4 (four) hours as needed for headache or mild pain.   allopurinol 100 MG tablet Commonly known as:  ZYLOPRIM Take 1 tablet (100 mg total) by mouth daily.   amiodarone 200 MG tablet Commonly known as:  PACERONE Take 1 tablet (200 mg total) by mouth 2 (two) times daily.   docusate sodium 100 MG capsule Commonly known as:  COLACE Take 1 capsule (100 mg total) by mouth 2 (two) times daily.   enoxaparin 100 MG/ML injection Commonly known as:  LOVENOX Inject 1 mL (100 mg total) into the skin every 12 (twelve) hours. Until directed to stop by coumadin clinic What changed:  additional instructions   ezetimibe-simvastatin 10-20 MG tablet Commonly known as:  VYTORIN Take 1 tablet by mouth at  bedtime.   folic acid 1 MG tablet Commonly known as:  FOLVITE Take 1 tablet (1 mg total) by mouth daily.   glucose blood test strip USE AS DIRECTED TO TEST BLOOD GLUCOSE  EVERY OTHER DAY DX: 250.00   glucose blood test strip Use as instructed to check blood sugar once daily. Dx Code E11.8   HYDROcodone-acetaminophen 5-325 MG tablet Commonly known as:  NORCO/VICODIN Take 1 tablet by mouth every 4 (four) hours as needed for moderate pain or severe pain.   hydrocortisone cream 1 % Apply 1 application topically as needed for itching (for legs).   ipratropium-albuterol 0.5-2.5 (3) MG/3ML Soln Commonly known as:  DUONEB Take 3 mLs by nebulization 4 (four) times daily.   metFORMIN 750 MG 24 hr tablet Commonly known as:  GLUCOPHAGE-XR TAKE 2 TABLETS (1,500 MG TOTAL) BY MOUTH DAILY.   metolazone 2.5 MG tablet Commonly known as:  ZAROXOLYN Take once weekly (Mondays). Further as directed by CHF clinic. What changed:  additional instructions   NON FORMULARY BiPAP: At bedtime   ONETOUCH DELICA LANCETS FINE Misc Use to obtain a blood specimen every day Dx code E11.8   pantoprazole 40 MG tablet Commonly known as:  PROTONIX Take 1 tablet (40 mg total) by mouth daily.   polyethylene glycol packet Commonly known as:  MIRALAX / GLYCOLAX Take 17 g by mouth daily. Hold for diarrhea.   potassium chloride SA 20 MEQ tablet Commonly known as:  K-DUR,KLOR-CON Take 3 tablets (60 mEq total) by mouth 2 (two) times daily.   PROAIR HFA 108 (90 Base) MCG/ACT inhaler Generic drug:  albuterol INHALE 2 PUFFS INTO THE LUNGS EVERY 6 HOURS AS NEEDED (FOR CHEST CONGESTION).   senna 8.6 MG Tabs tablet Commonly known as:  SENOKOT Take 1 tablet (8.6 mg total) by mouth daily as needed for mild constipation.   spironolactone 25 MG tablet Commonly known as:  ALDACTONE Take 0.5 tablets (12.5 mg total) by mouth daily.   torsemide 100 MG tablet Commonly known as:  DEMADEX Take 1 tablet (100 mg total)  by mouth 2 (two) times daily.   VASCULERA Tabs Take 1 tablet by mouth daily.   Vitamin B-12 3000 MCG Subl Take 3,000 mcg by mouth daily.   Vitamin D3 2000 units capsule Take 1 capsule (2,000 Units total) by mouth daily.   warfarin 5 MG tablet Commonly known as:  COUMADIN Take as directed. If you are unsure how to take this medication, talk to your nurse or doctor. Original instructions:  Take 7.5 mg daily until further per coumadin clinic.  (  Will then likely resume previous regimen of 7.5 mg by mouth on Monday, Wednesday and Friday and 5 mg on Sunday, Tuesday, Thursday and Saturday. What changed:  additional instructions            Durable Medical Equipment  (From admission, onward)         Start     Ordered   12/14/17 1047  Heart failure home health orders  (Heart failure home health orders / Face to face)  Once    Comments:  Heart Failure Follow-up Care:  Verify follow-up appointments per Patient Discharge Instructions. Confirm transportation arranged. Reconcile home medications with discharge medication list. Remove discontinued medications from use. Assist patient/caregiver to manage medications using pill box. Reinforce low sodium food selection Assessments: Vital signs and oxygen saturation at each visit. Assess home environment for safety concerns, caregiver support and availability of low-sodium foods. Consult Social Worker, PT/OT, Dietitian, and CNA based on assessments. Perform comprehensive cardiopulmonary assessment. Notify MD for any change in condition or weight gain of 3 pounds in one day or 5 pounds in one week with symptoms. Daily Weights and Symptom Monitoring: Ensure patient has access to scales. Teach patient/caregiver to weigh daily before breakfast and after voiding using same scale and record.    Teach patient/caregiver to track weight and symptoms and when to notify Provider. Activity: Develop individualized activity plan with  patient/caregiver.  Please check INR 12/17/17 and send to Coumadin Clinic per usual protocol.  Question Answer Comment  Heart Failure Follow-up Care Advanced Heart Failure (AHF) Clinic at [phone]   Obtain the following labs Basic Metabolic Panel   Lab frequency Other see comments   Fax lab results to AHF Clinic at [phone]   Fax lab results to Other see comments   Diet Low Sodium Heart Healthy   Fluid restrictions: 2000 mL Fluid      09 /13/19 1048          Disposition   The patient will be discharged in stable condition to home. Discharge Instructions    (HEART FAILURE PATIENTS) Call MD:  Anytime you have any of the following symptoms: 1) 3 pound weight gain in 24 hours or 5 pounds in 1 week 2) shortness of breath, with or without a dry hacking cough 3) swelling in the hands, feet or stomach 4) if you have to sleep on extra pillows at night in order to breathe.   Complete by:  As directed    Diet - low sodium heart healthy   Complete by:  As directed    Increase activity slowly   Complete by:  As directed    STOP any activity that causes chest pain, shortness of breath, dizziness, sweating, or exessive weakness   Complete by:  As directed      Follow-up Information    Call Lenn Cal, DDS.   Specialty:  Dentistry Why:  For follow-up appointment for suture removal in 7 to 10 days Contact information: [place] [address] [phone]        [facility] Follow up on 12/20/2017.   Specialty:  Cardiology Why:  at 130 pm for post hospital follow up. The code for parking is 1600 Contact information: [address] [id number] [place] Follow up.   Specialty:  Home Health Services Why:  Registered Nurse/ Physical Therapy Contact information: [address] [facility]  [place] [address] [phone]              Duration of Discharge Encounter: Greater than 35 minutes   Signed, Shirley Friar, PA-C 12/14/2017, 11:46 AM

## 2017-12-14 NOTE — Progress Notes (Signed)
Pt had 49 beats (runs) of VTach. Pt was sleeping. When awoken, pt denied any symptoms. BP=98/54. HR=94. Dr Abel Presto was made aware and ordered morning electrolytes.

## 2017-12-14 NOTE — Progress Notes (Addendum)
[name] for warfarin Indication: mechanical aortic valve/atrial fibrillation  Allergies  Allergen Reactions  . Rifampin Rash    May have been caused by Vancomycin or Rifampin (??)  . Vancomycin Rash    May have been caused by Vancomycin or Rifampin (??)    Patient Measurements: Height: 6\' 2"  (188 cm) Weight: 230 lb 6.4 oz (104.5 kg) IBW/kg (Calculated) : 82.2 Heparin Dosing Weight: 103.5 kg  Vital Signs: Temp: 98.1 F (36.7 C) (09/13 0357) Temp Source: Axillary (09/13 0357) BP: 98/54 (09/13 0357) Pulse Rate: 94 (09/13 0357)  Labs: Recent Labs    12/12/17 1854 12/13/17 0603 12/13/17 0724 12/14/17 0412  HGB 7.5* 6.8* 6.7* 7.6*  HCT 25.1* 22.8* 23.0* 24.3*  PLT 124* 128* 125* 105*  LABPROT  --  16.4* 17.0* 19.2*  INR  --  1.33 1.40 1.63  CREATININE 1.57* 1.55*  --  1.29*    Estimated Creatinine Clearance: 58.9 mL/min (A) (by C-G formula based on SCr of 1.29 mg/dL (H)).   Medical History: Past Medical History:  Diagnosis Date  . Carotid artery disease ([place]) 1994   s/p left carotid endarerectomy   . Chronic atrial fibrillation (HCC)    a. on coumadin   . Chronic diastolic CHF (congestive heart failure) ([place])   . Diabetes mellitus without complication ([place])    dx 2016  . Dyspnea   . Heart murmur   . Hx of CABG    a. 1994  . Hypertension   . OSA (obstructive sleep apnea)   . Rheumatic fever   . S/P AVR (aortic valve replacement)    a. mechanical valve 1996  . Subclavian bypass stenosis ([place])   . 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 Vitamin B12 deficiency    Assessment: 9 yof with h/o mechanical AVR in 1994 presented for elective dental extraction for chronic apical periodontitis. On warfarin for MVR/AFib PTA - was being bridged with enoxaparin while warfarin on hold for procedure (LD enox on 9/10).   S/p dental surgery 9/11. Patient had significant  bleeding 9/11 and required transfusion. Bleeding and oozing was improving yesterday. Hgb up to 7.6 post transfusion. Warfarin resumed 9/11.  INR this morning is trending up at 1.63. Discussed with cards and ok to start lovenox bridge today.   Home regimen is 5 mg daily except 7.5 mg on MWF.  Goal of Therapy:  INR 2.5-3.5 Monitor platelets by anticoagulation protocol: Yes   Discharge Plan:  Warfarin 7.5mg  daily at d/c Restart lovenox 100 mg q12 hours (has med at home) [facility] to check INR on [place] PharmD., BCPS Clinical Pharmacist 12/14/2017 8:16 AM

## 2017-12-14 NOTE — Progress Notes (Addendum)
Advanced Heart Failure Rounding Note  PCP-Cardiologist: Belva Crome III, MD   Subjective:    Weight 230 lbs. (Baseline closer to 225)  Feeling good today. Pain well controlled. Denies SOB. Would like to go home.   Patient had a run of VT last night, converted on its own. He was sleeping.   Hgb 7.6 this s/p 2 uPRBCs 12/13/17. INR 1.63.   Objective:   Weight Range: 104.5 kg Body mass index is 29.58 kg/m.   Vital Signs:   Temp:  [97.6 F (36.4 C)-98.6 F (37 C)] 98.1 F (36.7 C) (09/13 0357) Pulse Rate:  [84-102] 94 (09/13 0357) Resp:  [14-31] 17 (09/13 0357) BP: (98-117)/(54-72) 98/54 (09/13 0357) SpO2:  [88 %-93 %] 93 % (09/13 0357) Weight:  [104.5 kg] 104.5 kg (09/13 0357) Last BM Date: 12/11/17  Weight change: Filed Weights   12/12/17 1751 12/13/17 0635 12/14/17 0357  Weight: 105.3 kg 104.3 kg 104.5 kg    Intake/Output:   Intake/Output Summary (Last 24 hours) at 12/14/2017 0830 Last data filed at 12/14/2017 0429 Gross per 24 hour  Intake 1878 ml  Output 3050 ml  Net -1172 ml      Physical Exam    General:  Elderly. NAD  HEENT: Normal; buccal ecchymosis. Neck: Supple. JVP elevated. Carotids 2+ bilat; no bruits. No lymphadenopathy or thyromegaly appreciated. Cor: PMI nondisplaced. Irregularly irregular. Mechanical S1.  Lungs: Clear Abdomen: Soft, nontender, nondistended. No hepatosplenomegaly. No bruits or masses. Good bowel sounds. Extremities: No cyanosis, clubbing, or rash. 1-2+ BLE edema.  Neuro: Alert & orientedx3, cranial nerves grossly intact. moves all 4 extremities w/o difficulty. Affect pleasant   Telemetry   Afib 90s, chronic, with a fairly long run of VT around 3 am, personally reviewed.   EKG    No new tracings.    Labs    CBC Recent Labs    12/12/17 1854  12/13/17 0724 12/14/17 0412  WBC 7.4   < > 6.3 5.5  NEUTROABS 6.4  --   --   --   HGB 7.5*   < > 6.7* 7.6*  HCT 25.1*   < > 23.0* 24.3*  MCV 106.8*   < > 106.0* 100.0   PLT 124*   < > 125* 105*   < > = values in this interval not displayed.   Basic Metabolic Panel Recent Labs    12/13/17 0603 12/14/17 0412  NA 137 135  K 4.2 3.3*  CL 98 95*  CO2 30 29  GLUCOSE 126* 116*  BUN 34* 32*  CREATININE 1.55* 1.29*  CALCIUM 8.9 8.7*  MG  --  1.9   Liver Function Tests Recent Labs    12/12/17 1854  AST 22  ALT 10  ALKPHOS 100  BILITOT 1.4*  PROT 6.7  ALBUMIN 3.4*   No results for input(s): LIPASE, AMYLASE in the last 72 hours. Cardiac Enzymes No results for input(s): CKTOTAL, CKMB, CKMBINDEX, TROPONINI in the last 72 hours.  BNP: BNP (last 3 results) Recent Labs    08/01/17 1804 10/18/17 1510  BNP 784.4* 1,073.3*    ProBNP (last 3 results) Recent Labs    03/30/17 1147 05/25/17 1044 07/10/17 1523  PROBNP 1,858* 2,749* 411.0*     D-Dimer No results for input(s): DDIMER in the last 72 hours. Hemoglobin A1C No results for input(s): HGBA1C in the last 72 hours. Fasting Lipid Panel No results for input(s): CHOL, HDL, LDLCALC, TRIG, CHOLHDL, LDLDIRECT in the last 72 hours. Thyroid Function  Tests No results for input(s): TSH, T4TOTAL, T3FREE, THYROIDAB in the last 72 hours.  Invalid input(s): FREET3  Other results:   Imaging    No results found.   Medications:     Scheduled Medications: . sodium chloride   Intravenous Once  . allopurinol  100 mg Oral Daily  . aminocaproic acid  10 mL Oral Q1H  . docusate sodium  100 mg Oral BID  . ezetimibe-simvastatin  1 tablet Oral QHS  . feeding supplement (ENSURE ENLIVE)  237 mL Oral BID BM  . furosemide  80 mg Intravenous BID  . insulin aspart  0-15 Units Subcutaneous TID WC  . pantoprazole  40 mg Oral Daily  . potassium chloride  60 mEq Oral BID  . sodium chloride flush  3 mL Intravenous Q12H  . Warfarin - Pharmacist Dosing Inpatient   Does not apply q1800    Infusions: . sodium chloride    . lactated ringers      PRN Medications: sodium chloride, acetaminophen,  ipratropium-albuterol, morphine injection, ondansetron (ZOFRAN) IV, oxyCODONE-acetaminophen, sodium chloride flush    Patient Profile   [name] a 82yo Mwith h/o mechanical AVR 1994, OSA on CPAP, permanent atrial fibrillation on chronic coumadin therapy, chronic combined CHF followed by [facility], CKD III, DM, CAD s/p CAGB 1994 and recently diagnosed COPD and severe mitral regurgitation who [facility] for elective dental extraction for chronic apical periodontitis per Dr. Enrique Sack.  Assessment/Plan   1. Chronic apical periodontitis s/p multiple extractions with gross debridement of remaining dentition: - Pt presented to [facility] on 12/12/17 for elective procedure with the need to admit overnight for observation by cardiology team given his extensive history.  - Plan is to continue Amicar rinses postoperatively (67ml every hour for the next 10 hours ina swish and spit manner) - Continue coumadin  - Will discuss timing of lovenox with MD.   2. 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  - Structural Heart team evaluated during last hospitalization and found not to be a Mitraclip candidate. Dr. [name] consulted and recommended aggressive medical therapy versus possible experimental percutaneous MVR as he felt very high risk for conventional surgery - INR as below.   3. 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 Severe MR could be contributing to the significant RV failure per chart note -Pt last seen in [facility] and torsemide increased to 100 mg BID with KCl 60 meq BID  - Continue strict I&O, daily weights -  Volume status remains somewhat elevated - Will repeat lasix 80 mg IV this am with metolazone 2.5 mg x 1.  - Last office weight on 12/05/17 235lb  - BP stable.    4. Permanent atrial fibrillation: - Rate controlled per telemetry review - Not currently on AV blocking agents  - INR 1.63 today. Will discuss lovenox with MD due to mechanical valve.  - CHA2DS2VASc = 4  4. CAD s/p CABG: - No s/s of ischemia.    - Continue Vytorin  5. COPD: - Stable, on continuous home O2 with humidification  - Monitor O2 saturations closely - Continue home meds.   6. OSA: - Can resume Bipap QHS once  mouth stable.  - Will not start secondary to dental extraction and bleeding  - Monitor O2 saturations closely   7. CKD Stage III: -Cr 1.29. Baseline. Follow.   8. DM2: - SSI while in house.   9. Post-Op Anemia - Hgb 7.6 this am. Will discuss with MD. Near baseline.  - Will give 1 dose feraheme.  - IV lasix.   10. Hypokalemia - K 3.3. Ordered for 60 meq BID - Will give additional 40 meq  11. VT - see attending note below.   Will give IV lasix + metolazone + feraheme this am and then plan on home this afternoon.   Medication concerns reviewed with patient and pharmacy team. Barriers identified: None at this time.   Length of Stay: 1  Annamaria Helling  12/14/2017, 8:30 AM  Advanced Heart Failure Team Pager [phone] (M-F; 7a - 4p)  Please contact [facility] for night-coverage after hours (4p -7a ) and weekends on [email or web address]  Patient seen with PA, agree with the above note.  Hgb up with transfusion yesterday, not far off baseline now.  Good diuresis.  No dyspnea.  Had run of VT last night, self-terminated.   On exam, JVP 10 cm.  Irregular S1S2 with 2/6 HSM apex. Mechanical S2.  Clear lungs. 1+ edema to knees.   1. S/p dental surgery: Bleeding in mouth appears to have stopped.  INR 1.63.   - Continue warfarin, may start Lovenox bridge today.   2. Acute on chronic  diastolic CHF with prominent RV failure: Echo most recently showed EF 55-65%, mechanical aortic valve functioning normally, mild mitral stenosis/severe mitral regurgitation, RV moderately dilated with moderately decreased systolic function, PASP 49 mmHg. Severe MR likely contributing to the significant RV failure. He remains volume overloaded but improved, he diuresed well yesterday with Lasix/metolazone.   -Lasix 80 mg IV x 1 this morning and will give dose of metolazone.  - After this morning, will transition to torsemide 100 mg bid and he will take metolazone 2.5 mg with am torsemide every Monday.  - Replace K.   3. Mechanical aortic valve:Appears to function well on last echo.  - Restarted warfarin with INR goal 2.5-3.5 (also with atrial fibrillation).  - Start Lovenox today.  He will need close followup with coumadin clinic for Lovenox bridge.  4. Atrial fibrillation: Chronic. Rate controlled 70s.No change. 5. CAD: s/p CABG.No CP. - Continue Vytorin.  6. COPD:On home oxygen. No change. 7. OSA: Continue home CPAP.No change. 8. Acute on CKD: Stage 3. Creatinine stable 1.29.  9. Anemia: Baseline anemia chronic disease/renal disease, worse with blood loss with dental surgery.  Appropriate rise with  - Continue Protonix.  10. Deconditioning: Out of bed, PT.  11. Thrombocytopenia: This appears chronic, had before this admission. No change 12. 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 Structural Heart team evaluated. It does not appear that he will be a Mitraclip candidate. He was seen by Dr. [name] who recommended aggressive medical therapy versus possible experimental percutaneous MVR as he felt very high risk for conventional surgery. No change.  13. VT: Patient had run of VT last night, self-converted.  He was sleeping.  He felt palpitations when they woke him up, not  lightheaded. K low this morning.   - Replace K and Mg aggressively.   - We have him off Coreg because of soft BP.  -  Given concern about BP tolerance of Coreg, I will start him on amiodarone 200 mg bid to keep him out of VT (it was a fairly long run early this morning). He can continue this 1 week then decrease to 200 mg daily.  He took amiodarone in the past for afib.  14. Disposition: I think he can go home this afternoon with close followup in CHF clinic and with coumadin clinic.  Meds for home: Lovenox/warfarin overlap, amiodarone 200 mg bid x 1 week then 200 mg daily, torsemide 100 mg bid with metolazone 2.5 mg once weekly on Mondays, KCl 60 bid with extra 40 on metolazone days, Vytorin.    Loralie Champagne 12/14/2017 12:05 PM

## 2017-12-14 NOTE — Progress Notes (Signed)
PROGRESS NOTE:  12/14/2017 [name] [id number]  VITALS: BP (!) 98/54 (BP Location: Right Arm)   Pulse 94   Temp 98.1 F (36.7 C) (Axillary)   Resp 17   Ht 6\' 2"  (1.88 m)   Wt 104.5 kg   SpO2 93%   BMI 29.58 kg/m   LABS:  Lab Results  Component Value Date   WBC 5.5 12/14/2017   HGB 7.6 (L) 12/14/2017   HCT 24.3 (L) 12/14/2017   MCV 100.0 12/14/2017   PLT 105 (L) 12/14/2017   BMET    Component Value Date/Time   NA 135 12/14/2017 0412   NA 140 07/27/2017 1159   NA 140 01/19/2017 0827   K 3.3 (L) 12/14/2017 0412   K 4.8 01/19/2017 0827   CL 95 (L) 12/14/2017 0412   CO2 29 12/14/2017 0412   CO2 24 01/19/2017 0827   GLUCOSE 116 (H) 12/14/2017 0412   GLUCOSE 103 01/19/2017 0827   BUN 32 (H) 12/14/2017 0412   BUN 44 (H) 07/27/2017 1159   BUN 28.4 (H) 01/19/2017 0827   CREATININE 1.29 (H) 12/14/2017 0412   CREATININE 1.43 (H) 04/20/2017 0943   CREATININE 1.2 01/19/2017 0827   CALCIUM 8.7 (L) 12/14/2017 0412   CALCIUM 9.7 01/19/2017 0827   GFRNONAA 51 (L) 12/14/2017 0412   GFRNONAA 45 (L) 04/20/2017 0943   GFRAA 59 (L) 12/14/2017 0412   GFRAA 52 (L) 04/20/2017 0943    Lab Results  Component Value Date   INR 1.63 12/14/2017   INR 1.40 12/13/2017   INR 1.33 12/13/2017   No results found for: PTT   Tejon Dondero is status post multiple extractions with alveoloplasty and gross debridement of remaining dentition on 12/12/2017. Patient initially had significant postoperative bleeding that is now resolved.  Patient had blood product  transfusions yesterday.  SUBJECTIVE: Patient denies any active bleeding.  Patient denies having any significant discomfort dental extraction sites.  EXAM: There is no sign of intraoral bleeding, heme, or ooze.  Sutures are intact.  Clots are present.  Patient has significant intraoral and extraoral ecchymoses.  ASSESSMENT: Post operative course initially complicated by postoperative bleeding, now resolved.  Loss of multiple  teeth due to extraction  PLAN: 1.  Use gentle salt water rinses as needed to aid healing. 2.  Brush teeth after meals and at bedtime. 3.  Advance diet as tolerated to a soft mechanical diet the aid of the nutritional consultation. 4.  Patient to follow-up with dental medicine 10 to 14 days for evaluation of healing and suture removal.  Sutures may dissolve on their own before then.   5.  Okay for discharge from a dental standpoint.   Lenn Cal, DDS

## 2017-12-16 ENCOUNTER — Telehealth: Payer: Self-pay | Admitting: Physician Assistant

## 2017-12-16 DIAGNOSIS — N183 Chronic kidney disease, stage 3 (moderate): Secondary | ICD-10-CM | POA: Diagnosis not present

## 2017-12-16 DIAGNOSIS — I5033 Acute on chronic diastolic (congestive) heart failure: Secondary | ICD-10-CM | POA: Diagnosis not present

## 2017-12-16 DIAGNOSIS — D631 Anemia in chronic kidney disease: Secondary | ICD-10-CM | POA: Diagnosis not present

## 2017-12-16 DIAGNOSIS — E1122 Type 2 diabetes mellitus with diabetic chronic kidney disease: Secondary | ICD-10-CM | POA: Diagnosis not present

## 2017-12-16 DIAGNOSIS — I130 Hypertensive heart and chronic kidney disease with heart failure and stage 1 through stage 4 chronic kidney disease, or unspecified chronic kidney disease: Secondary | ICD-10-CM | POA: Diagnosis not present

## 2017-12-16 DIAGNOSIS — I2510 Atherosclerotic heart disease of native coronary artery without angina pectoris: Secondary | ICD-10-CM | POA: Diagnosis not present

## 2017-12-16 NOTE — Telephone Encounter (Signed)
Advance home care [facility]) is called for Lovenox instruction: 100mg  BID discussed per discharge medication as below.  enoxaparin 100 MG/ML injection Commonly known as:  LOVENOX Inject 1 mL (100 mg total) into the skin every 12 (twelve) hours. Until directed to stop by coumadin clinic

## 2017-12-17 ENCOUNTER — Ambulatory Visit (INDEPENDENT_AMBULATORY_CARE_PROVIDER_SITE_OTHER): Payer: Medicare Other

## 2017-12-17 DIAGNOSIS — D631 Anemia in chronic kidney disease: Secondary | ICD-10-CM | POA: Diagnosis not present

## 2017-12-17 DIAGNOSIS — Z952 Presence of prosthetic heart valve: Secondary | ICD-10-CM | POA: Diagnosis not present

## 2017-12-17 DIAGNOSIS — I130 Hypertensive heart and chronic kidney disease with heart failure and stage 1 through stage 4 chronic kidney disease, or unspecified chronic kidney disease: Secondary | ICD-10-CM | POA: Diagnosis not present

## 2017-12-17 DIAGNOSIS — I482 Chronic atrial fibrillation, unspecified: Secondary | ICD-10-CM

## 2017-12-17 DIAGNOSIS — I2510 Atherosclerotic heart disease of native coronary artery without angina pectoris: Secondary | ICD-10-CM | POA: Diagnosis not present

## 2017-12-17 DIAGNOSIS — I5033 Acute on chronic diastolic (congestive) heart failure: Secondary | ICD-10-CM | POA: Diagnosis not present

## 2017-12-17 DIAGNOSIS — Z7189 Other specified counseling: Secondary | ICD-10-CM | POA: Diagnosis not present

## 2017-12-17 DIAGNOSIS — E1122 Type 2 diabetes mellitus with diabetic chronic kidney disease: Secondary | ICD-10-CM | POA: Diagnosis not present

## 2017-12-17 DIAGNOSIS — N183 Chronic kidney disease, stage 3 (moderate): Secondary | ICD-10-CM | POA: Diagnosis not present

## 2017-12-17 LAB — POCT INR: INR: 2.9 (ref 2.0–3.0)

## 2017-12-17 NOTE — Telephone Encounter (Signed)
[name] called from [facility] called to get approval for orders. Patient had oral surgery and was in the hospital she wanted to resume his PT  2week 1 1 week 2  Total of 4 visits   Call back [phone]

## 2017-12-17 NOTE — Patient Instructions (Signed)
Description   Spoke with Tiara, RN [facility] while in home with pt, advised to have pt stop taking Lovenox injections and continue on same dosage of Warfarin 1 tablet everyday except 1.5 tablets on Mondays, Wednesdays and Fridays. Recheck on Friday. Orders given to Arma [phone]) with [facility] -  Call with any new medications or procedures [phone].

## 2017-12-18 NOTE — Telephone Encounter (Signed)
LM giving verbals, FYI 

## 2017-12-19 NOTE — Telephone Encounter (Signed)
Ok Thx 

## 2017-12-20 ENCOUNTER — Other Ambulatory Visit (HOSPITAL_COMMUNITY): Payer: Self-pay | Admitting: Cardiology

## 2017-12-20 ENCOUNTER — Telehealth (HOSPITAL_COMMUNITY): Payer: Self-pay | Admitting: Student

## 2017-12-20 ENCOUNTER — Ambulatory Visit (HOSPITAL_BASED_OUTPATIENT_CLINIC_OR_DEPARTMENT_OTHER)
Admission: RE | Admit: 2017-12-20 | Discharge: 2017-12-20 | Disposition: A | Discharge status: Home / Self Care | Payer: Medicare Other | Source: Ambulatory Visit | Attending: Cardiology | Admitting: Cardiology

## 2017-12-20 ENCOUNTER — Other Ambulatory Visit (HOSPITAL_COMMUNITY): Payer: Self-pay | Admitting: *Deleted

## 2017-12-20 VITALS — BP 116/62 | HR 84 | Wt 222.3 lb

## 2017-12-20 DIAGNOSIS — M109 Gout, unspecified: Secondary | ICD-10-CM

## 2017-12-20 DIAGNOSIS — E876 Hypokalemia: Secondary | ICD-10-CM

## 2017-12-20 DIAGNOSIS — Z87891 Personal history of nicotine dependence: Secondary | ICD-10-CM | POA: Insufficient documentation

## 2017-12-20 DIAGNOSIS — R58 Hemorrhage, not elsewhere classified: Secondary | ICD-10-CM

## 2017-12-20 DIAGNOSIS — I5042 Chronic combined systolic (congestive) and diastolic (congestive) heart failure: Secondary | ICD-10-CM | POA: Diagnosis not present

## 2017-12-20 DIAGNOSIS — I2510 Atherosclerotic heart disease of native coronary artery without angina pectoris: Secondary | ICD-10-CM | POA: Insufficient documentation

## 2017-12-20 DIAGNOSIS — Z79899 Other long term (current) drug therapy: Secondary | ICD-10-CM | POA: Insufficient documentation

## 2017-12-20 DIAGNOSIS — D649 Anemia, unspecified: Secondary | ICD-10-CM

## 2017-12-20 DIAGNOSIS — Z9889 Other specified postprocedural states: Secondary | ICD-10-CM | POA: Insufficient documentation

## 2017-12-20 DIAGNOSIS — Z952 Presence of prosthetic heart valve: Secondary | ICD-10-CM

## 2017-12-20 DIAGNOSIS — K1379 Other lesions of oral mucosa: Secondary | ICD-10-CM | POA: Insufficient documentation

## 2017-12-20 DIAGNOSIS — I130 Hypertensive heart and chronic kidney disease with heart failure and stage 1 through stage 4 chronic kidney disease, or unspecified chronic kidney disease: Secondary | ICD-10-CM

## 2017-12-20 DIAGNOSIS — G4733 Obstructive sleep apnea (adult) (pediatric): Secondary | ICD-10-CM | POA: Insufficient documentation

## 2017-12-20 DIAGNOSIS — E1122 Type 2 diabetes mellitus with diabetic chronic kidney disease: Secondary | ICD-10-CM | POA: Diagnosis not present

## 2017-12-20 DIAGNOSIS — Z7984 Long term (current) use of oral hypoglycemic drugs: Secondary | ICD-10-CM

## 2017-12-20 DIAGNOSIS — I5032 Chronic diastolic (congestive) heart failure: Secondary | ICD-10-CM | POA: Insufficient documentation

## 2017-12-20 DIAGNOSIS — D62 Acute posthemorrhagic anemia: Secondary | ICD-10-CM | POA: Diagnosis not present

## 2017-12-20 DIAGNOSIS — J449 Chronic obstructive pulmonary disease, unspecified: Secondary | ICD-10-CM | POA: Insufficient documentation

## 2017-12-20 DIAGNOSIS — R531 Weakness: Secondary | ICD-10-CM | POA: Diagnosis not present

## 2017-12-20 DIAGNOSIS — I340 Nonrheumatic mitral (valve) insufficiency: Secondary | ICD-10-CM

## 2017-12-20 DIAGNOSIS — D631 Anemia in chronic kidney disease: Secondary | ICD-10-CM | POA: Diagnosis not present

## 2017-12-20 DIAGNOSIS — Z23 Encounter for immunization: Secondary | ICD-10-CM | POA: Diagnosis not present

## 2017-12-20 DIAGNOSIS — D689 Coagulation defect, unspecified: Secondary | ICD-10-CM | POA: Diagnosis not present

## 2017-12-20 DIAGNOSIS — Z7901 Long term (current) use of anticoagulants: Secondary | ICD-10-CM

## 2017-12-20 DIAGNOSIS — I10 Essential (primary) hypertension: Secondary | ICD-10-CM

## 2017-12-20 DIAGNOSIS — N183 Chronic kidney disease, stage 3 (moderate): Secondary | ICD-10-CM

## 2017-12-20 DIAGNOSIS — I482 Chronic atrial fibrillation, unspecified: Secondary | ICD-10-CM

## 2017-12-20 DIAGNOSIS — Z951 Presence of aortocoronary bypass graft: Secondary | ICD-10-CM

## 2017-12-20 DIAGNOSIS — I50812 Chronic right heart failure: Secondary | ICD-10-CM | POA: Diagnosis not present

## 2017-12-20 DIAGNOSIS — E875 Hyperkalemia: Secondary | ICD-10-CM | POA: Diagnosis not present

## 2017-12-20 DIAGNOSIS — R791 Abnormal coagulation profile: Secondary | ICD-10-CM | POA: Diagnosis not present

## 2017-12-20 DIAGNOSIS — I5033 Acute on chronic diastolic (congestive) heart failure: Secondary | ICD-10-CM | POA: Diagnosis not present

## 2017-12-20 DIAGNOSIS — Z9981 Dependence on supplemental oxygen: Secondary | ICD-10-CM

## 2017-12-20 DIAGNOSIS — I959 Hypotension, unspecified: Secondary | ICD-10-CM | POA: Insufficient documentation

## 2017-12-20 DIAGNOSIS — N179 Acute kidney failure, unspecified: Secondary | ICD-10-CM | POA: Diagnosis not present

## 2017-12-20 LAB — CBC
HEMATOCRIT: 23.3 % — AB (ref 39.0–52.0)
HEMOGLOBIN: 7.1 g/dL — AB (ref 13.0–17.0)
MCH: 32.3 pg (ref 26.0–34.0)
MCHC: 30.5 g/dL (ref 30.0–36.0)
MCV: 105.9 fL — ABNORMAL HIGH (ref 78.0–100.0)
Platelets: 179 10*3/uL (ref 150–400)
RBC: 2.2 MIL/uL — AB (ref 4.22–5.81)
RDW: 21.6 % — ABNORMAL HIGH (ref 11.5–15.5)
WBC: 6.9 10*3/uL (ref 4.0–10.5)

## 2017-12-20 LAB — BASIC METABOLIC PANEL
ANION GAP: 15 (ref 5–15)
BUN: 37 mg/dL — ABNORMAL HIGH (ref 8–23)
CO2: 29 mmol/L (ref 22–32)
Calcium: 9.4 mg/dL (ref 8.9–10.3)
Chloride: 90 mmol/L — ABNORMAL LOW (ref 98–111)
Creatinine, Ser: 1.92 mg/dL — ABNORMAL HIGH (ref 0.61–1.24)
GFR, EST AFRICAN AMERICAN: 36 mL/min — AB (ref >60)
GFR, EST NON AFRICAN AMERICAN: 31 mL/min — AB (ref >60)
Glucose, Bld: 118 mg/dL — ABNORMAL HIGH (ref 70–99)
POTASSIUM: 4.4 mmol/L (ref 3.5–5.1)
Sodium: 134 mmol/L — ABNORMAL LOW (ref 135–145)

## 2017-12-20 LAB — PROTIME-INR
INR: 2.81
Prothrombin Time: 29.3 seconds — ABNORMAL HIGH (ref 11.4–15.2)

## 2017-12-20 MED ORDER — SODIUM CHLORIDE 0.9% IV SOLUTION: Freq: Once | INTRAVENOUS | Status: DC

## 2017-12-20 MED ORDER — METOLAZONE 2.5 MG PO TABS: 2.5000 mg | ORAL_TABLET | ORAL | 3 refills | Status: DC | PRN

## 2017-12-20 MED ORDER — METOLAZONE 2.5 MG PO TABS: ORAL_TABLET | ORAL | 3 refills | Status: DC

## 2017-12-20 NOTE — Patient Instructions (Addendum)
Hold Torsemide for 2 doses, then resume 100 mg twice a day Change Metolazone to AS NEEDED for weight increase (3lbs overnight/ 5 lbs in a week)  Your physician recommends that you schedule a follow-up appointment in: 3 weeks with Rebecca Eaton  Your physician recommends that you schedule a follow-up appointment in: 2 months with [name]

## 2017-12-20 NOTE — Progress Notes (Signed)
Advanced Heart Failure Clinic Note   PCP: [name], [name], MD PCP-Cardiologist: [name], MD  HF: [name]  HPI: [name] is a 82 y.o. male with h/o mechanical AVR 1994, OSA on CPAP, Chronic afib, chronic combined CHF, CKD III, chronic coumadin therapy, CAD s/p CAGB 1994, and COPD.   Admitted 7/18-11/05/17 from [facility] office with A/C diastolic HF. Diuresed 83 lbs with lasix drip, metolazone, and diamox. Transitioned to torsemide 80 mg BID. Determined not to be a candidate for MitraClip. Dr [name] also consulted and recommended aggressive medical therapy versus possible experimental percutaneous MVR as he felt very high risk for conventional surgery. Coarse complicated by drop in hemoglobin and symptomatic hypotension, requiring 1 unit pRBCs. GI consulted and did not recommend scoping. BB and spiro were stopped due to low BPs. Had some AKI assoicated with low BP, but resolved by discharge. Required lovenox bridge at DC with subtherapeutic INR. Discharged with [facility]. DC weight: 216 lbs.   Admitted 9/11 - 12/14/17 for dental extractions in setting of work up for MVR. Hospital course complicated by run of VT in the setting of hypokalemia. Supped, and started on amiodarone with h/o of arrhythmia. Bridged with lovenox.   He presents today for post hospital follow up. He has been fatigued and is c/p continued bleeding from his gums. Wife says he had a few good days, but has been bleeding since he got up this am. They seem to have been attempting to follow Dr. Ritta Slot discharge instruction, but have tried many different things to get the bleeding to stop today. He has tried gauze, but not sure he kept in long enough. He denies dizziness or lightheadedness. No melena or BRPBR. Weight down 8 lbs from discharge. Not taking much po intake with bleeding. No CP or palpitations. No fever or chills.   Review of systems complete and found to be negative unless listed in HPI.    Past Medical  History:  Diagnosis Date  . Carotid artery disease ([place]) 1994   s/p left carotid endarerectomy   . Chronic atrial fibrillation (HCC)    a. on coumadin   . Chronic diastolic CHF (congestive heart failure) ([place])   . Diabetes mellitus without complication ([place])    dx 2016  . Dyspnea   . Heart murmur   . Hx of CABG    a. 1994  . Hypertension   . OSA (obstructive sleep apnea)   . Rheumatic fever   . S/P AVR (aortic valve replacement)    a. mechanical valve 1996  . Subclavian bypass stenosis ([place])   . 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 Vitamin B12 deficiency     Current Outpatient Medications  Medication Sig Dispense Refill  . acetaminophen (TYLENOL) 325 MG tablet Take 2 tablets (650 mg total) by mouth every 4 (four) hours as needed for headache or mild pain.    Marland Kitchen allopurinol (ZYLOPRIM) 100 MG tablet Take 1 tablet (100 mg total) by mouth daily. 30 tablet 3  . amiodarone (PACERONE) 200 MG tablet Take 1 tablet (200 mg total) by mouth 2 (two) times daily. 60 tablet 3  . Cholecalciferol (VITAMIN D3) 2000 units capsule Take 1 capsule (2,000 Units total) by mouth daily. 100 capsule 3  . Cyanocobalamin (VITAMIN B-12) 3000 MCG SUBL Take 3,000 mcg by mouth daily.     . Dietary Management Product (VASCULERA) TABS Take 1 tablet by mouth daily. 90 tablet  3  . docusate sodium (COLACE) 100 MG capsule Take 1 capsule (100 mg total) by mouth 2 (two) times daily. 10 capsule 0  . enoxaparin (LOVENOX) 100 MG/ML injection Inject 1 mL (100 mg total) into the skin every 12 (twelve) hours. Until directed to stop by coumadin clinic 20 Syringe 1  . ezetimibe-simvastatin (VYTORIN) 10-20 MG tablet Take 1 tablet by mouth at bedtime. 90 tablet 3  . folic acid (FOLVITE) 1 MG tablet Take 1 tablet (1 mg total) by mouth daily.    Marland Kitchen glucose blood (ONE TOUCH ULTRA TEST) test strip USE AS DIRECTED TO TEST BLOOD GLUCOSE  EVERY OTHER DAY DX: 250.00 50 each 2  .  glucose blood (ONETOUCH VERIO) test strip Use as instructed to check blood sugar once daily. Dx Code E11.8 100 each 3  . HYDROcodone-acetaminophen (NORCO) 5-325 MG tablet Take 1 tablet by mouth every 4 (four) hours as needed for moderate pain or severe pain. 30 tablet 0  . hydrocortisone cream 1 % Apply 1 application topically as needed for itching (for legs).    Marland Kitchen ipratropium-albuterol (DUONEB) 0.5-2.5 (3) MG/3ML SOLN Take 3 mLs by nebulization 4 (four) times daily. 360 mL 3  . metFORMIN (GLUCOPHAGE-XR) 750 MG 24 hr tablet TAKE 2 TABLETS (1,500 MG TOTAL) BY MOUTH DAILY. 180 tablet 2  . metolazone (ZAROXOLYN) 2.5 MG tablet Take once weekly (Mondays). Further as directed by CHF clinic. 10 tablet 3  . NON FORMULARY BiPAP: At bedtime    . ONETOUCH DELICA LANCETS FINE MISC Use to obtain a blood specimen every day Dx code E11.8 100 each 1  . pantoprazole (PROTONIX) 40 MG tablet Take 1 tablet (40 mg total) by mouth daily. 14 tablet 0  . polyethylene glycol (MIRALAX / GLYCOLAX) packet Take 17 g by mouth daily. Hold for diarrhea. 14 each 0  . potassium chloride SA (K-DUR,KLOR-CON) 20 MEQ tablet Take 3 tablets (60 mEq total) by mouth 2 (two) times daily. 180 tablet 6  . PROAIR HFA 108 (90 Base) MCG/ACT inhaler INHALE 2 PUFFS INTO THE LUNGS EVERY 6 HOURS AS NEEDED (FOR CHEST CONGESTION). 1 Inhaler 11  . senna (SENOKOT) 8.6 MG TABS tablet Take 1 tablet (8.6 mg total) by mouth daily as needed for mild constipation. 120 each 0  . spironolactone (ALDACTONE) 25 MG tablet Take 0.5 tablets (12.5 mg total) by mouth daily. 45 tablet 3  . torsemide (DEMADEX) 100 MG tablet Take 1 tablet (100 mg total) by mouth 2 (two) times daily. 60 tablet 3  . warfarin (COUMADIN) 5 MG tablet Take 7.5 mg daily until further per coumadin clinic.  (Will then likely resume previous regimen of 7.5 mg by mouth on Monday, Wednesday and Friday and 5 mg on Sunday, Tuesday, Thursday and Saturday. 10 tablet 0   No current facility-administered  medications for this encounter.     Allergies  Allergen Reactions  . Rifampin Rash    May have been caused by Vancomycin or Rifampin (??)  . Vancomycin Rash    May have been caused by Vancomycin or Rifampin (??)      Social History   Socioeconomic History  . Marital status: Married    Spouse name: Not on file  . Number of children: 3  . Years of education: Not on file  . Highest education level: Not on file  Occupational History  . Not on file  Social Needs  . Financial resource strain: Not on file  . Food insecurity:    Worry: Not  on file    Inability: Not on file  . Transportation needs:    Medical: Not on file    Non-medical: Not on file  Tobacco Use  . Smoking status: Former Smoker    Packs/day: 1.00    Years: 30.00    Pack years: 30.00    Types: Cigarettes    Last attempt to quit: 04/03/1992    Years since quitting: 25.7  . Smokeless tobacco: Never Used  Substance and Sexual Activity  . Alcohol use: Yes    Comment: 4-6 ounces of wine daily  . Drug use: No    Comment: Half a cup a day.  Marland Kitchen Sexual activity: Not on file  Lifestyle  . Physical activity:    Days per week: Not on file    Minutes per session: Not on file  . Stress: Not on file  Relationships  . Social connections:    Talks on phone: Not on file    Gets together: Not on file    Attends religious service: Not on file    Active member of club or organization: Not on file    Attends meetings of clubs or organizations: Not on file    Relationship status: Not on file  . Intimate partner violence:    Fear of current or ex partner: Not on file    Emotionally abused: Not on file    Physically abused: Not on file    Forced sexual activity: Not on file  Other Topics Concern  . Not on file  Social History Narrative   Patient is married and lives with his wife.   Youngest daughter lives in [place] also.   Patient has 2 other children.      Family History  Problem Relation Age of Onset  .  Cancer Father        lung   . Hypertension Mother     Vitals:   12/20/17 1327  BP: 116/62  Pulse: 84  SpO2: 93%  Weight: 100.8 kg (222 lb 4.8 oz)   Wt Readings from Last 3 Encounters:  12/20/17 100.8 kg (222 lb 4.8 oz)  12/14/17 104.5 kg (230 lb 6.4 oz)  12/07/17 105.9 kg (233 lb 6.4 oz)    PHYSICAL EXAM: General: Elderly. NAD. Ar[facility] in [facility].  HEENT: Normal. On arrival pt continually gently pulling clots out of his mouth. Improved with gauze Neck: Supple. JVP not elevated.  Carotids 2+ bilat; no bruits. No thyromegaly or nodule noted. Cor: PMI nondisplaced. IRR, Mechanical S1, 2/6 HSM at apex.  Lungs: CTAB, normal effort. Abdomen: Soft, non-tender, non-distended, no HSM. No bruits or masses. +BS  Extremities: No cyanosis, clubbing, or rash. Trace to 1+ ankle edema.  Neuro: Alert & orientedx3, cranial nerves grossly intact. moves all 4 extremities w/o difficulty. Affect pleasant   ASSESSMENT & PLAN:  1. 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  Severe MR could be contributing to the significant RV failure.   - NYHA class III, not very active.  - Volume status stable to dry in setting of poor po intake.  - Hold torsemide x 2 doses, then resume 100 mg BID.  - Cr 1.2 -> 1.9. Stop metolazone. Change back to as needed only.  - Continue spiro 12.5 mg qHS.  - Continue unna boots through [facility].  - Discussed limiting fluid and salt  intake.  2. Mechanical aortic valve: Appears to function well on echo last admission.  - Continue warfarin with INR goal 2.5-3.5 (also with atrial fibrillation).  3. Atrial fibrillation: Chronic.   - Rate controlled. 4. CAD: s/p CABG.  - No s/s ischemia - Continue Vytorin.  5. COPD:  - Continue home oxygen. Continue humidifier. 6. OSA:  -  Continue BiPAP qHS 7. AKI on CKD: Stage 3.   - Metolazone prn. Hold torsemide x 2 doses as above.  8. Anemia:  - Required blood transfusion while inpatient. GI saw. Guaiac negative. Scope not indicated. - Continue Protonix. Denies bleeding.  - CBC today Hgb 7.1. Will arrange for 1 uPRBCs.  9. Thrombocytopenia - Chronic.   10. 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  - Structural Heart team evaluated. Not a Mitraclip candidate. - He has been seen by Dr. [name] who recommended aggressive medical therapy versus possible experimental percutaneous MVR as he felt very high risk for conventional surgery.  11. Gout:  - Continue allopurinol 100 mg daily.  - Per PCP 12. Dental Extractions - s/p multiple extractions.  - Off lovenox.  - Bleeding on arrival to clinic. Reached out to Dr. Enrique Sack office, but no word back at time of note.  - By following instructions on Dr. Ritta Slot discharge instructions, was able to improve bleeding to a point "better" than it's been in a few days. Suspect that pt and wife have been anxious and jumping back and forth between different means of trying to stop the bleeding without allowing adequate time for clot to form.   Bleeding well controlled with biting gently down on gauze in mouth as directed by Dr. Ritta Slot note. Encouraged close follow up. We are awaiting word from Dr. Enrique Sack. Arrange for 1 uPRBCs with anemia. Diuretics adjusted with AKI. Needs close dental follow up. RTC 3 weeks. Sooner with symptoms. If no improvement over weekend, pt instructed to report to ED.   Shirley Friar, PA-C 12/20/17   Greater than 50% of the 45 minute visit was spent in counseling/coordination of care regarding disease state education, salt/fluid restriction, sliding scale diuretics, and medication compliance.

## 2017-12-20 NOTE — Telephone Encounter (Signed)
  Spoke with Dr. Enrique Sack and passed on instructions.   Bleeding to be expected with recent extraction and blood thinners. Instructions for holding gauze in mouth with light pressure for 1-2 hours re-iterated.    Pt wife states that he has had no further bleeding since leaving the clinic some time ago. They plan to continue gentle pressure gauze with any recurrence.   Confirmed patient understood instructions to hold torsemide and potassium x 2 doses then resume.   Pt wife verbalized understanding.     [name] [address]" [place], PA-C 12/20/2017 3:55 PM

## 2017-12-21 ENCOUNTER — Other Ambulatory Visit (HOSPITAL_COMMUNITY): Payer: Self-pay | Admitting: Cardiology

## 2017-12-21 ENCOUNTER — Ambulatory Visit (INDEPENDENT_AMBULATORY_CARE_PROVIDER_SITE_OTHER): Payer: Medicare Other | Admitting: Interventional Cardiology

## 2017-12-21 DIAGNOSIS — I2510 Atherosclerotic heart disease of native coronary artery without angina pectoris: Secondary | ICD-10-CM | POA: Diagnosis not present

## 2017-12-21 DIAGNOSIS — D631 Anemia in chronic kidney disease: Secondary | ICD-10-CM | POA: Diagnosis not present

## 2017-12-21 DIAGNOSIS — I482 Chronic atrial fibrillation, unspecified: Secondary | ICD-10-CM

## 2017-12-21 DIAGNOSIS — Z952 Presence of prosthetic heart valve: Secondary | ICD-10-CM | POA: Diagnosis not present

## 2017-12-21 DIAGNOSIS — Z5181 Encounter for therapeutic drug level monitoring: Secondary | ICD-10-CM | POA: Diagnosis not present

## 2017-12-21 DIAGNOSIS — I5033 Acute on chronic diastolic (congestive) heart failure: Secondary | ICD-10-CM | POA: Diagnosis not present

## 2017-12-21 DIAGNOSIS — N183 Chronic kidney disease, stage 3 (moderate): Secondary | ICD-10-CM | POA: Diagnosis not present

## 2017-12-21 DIAGNOSIS — E1122 Type 2 diabetes mellitus with diabetic chronic kidney disease: Secondary | ICD-10-CM | POA: Diagnosis not present

## 2017-12-21 DIAGNOSIS — I130 Hypertensive heart and chronic kidney disease with heart failure and stage 1 through stage 4 chronic kidney disease, or unspecified chronic kidney disease: Secondary | ICD-10-CM | POA: Diagnosis not present

## 2017-12-21 DIAGNOSIS — Z7189 Other specified counseling: Secondary | ICD-10-CM

## 2017-12-22 DIAGNOSIS — I5033 Acute on chronic diastolic (congestive) heart failure: Secondary | ICD-10-CM | POA: Diagnosis not present

## 2017-12-22 DIAGNOSIS — D631 Anemia in chronic kidney disease: Secondary | ICD-10-CM | POA: Diagnosis not present

## 2017-12-22 DIAGNOSIS — I2510 Atherosclerotic heart disease of native coronary artery without angina pectoris: Secondary | ICD-10-CM | POA: Diagnosis not present

## 2017-12-22 DIAGNOSIS — N183 Chronic kidney disease, stage 3 (moderate): Secondary | ICD-10-CM | POA: Diagnosis not present

## 2017-12-22 DIAGNOSIS — E1122 Type 2 diabetes mellitus with diabetic chronic kidney disease: Secondary | ICD-10-CM | POA: Diagnosis not present

## 2017-12-22 DIAGNOSIS — I130 Hypertensive heart and chronic kidney disease with heart failure and stage 1 through stage 4 chronic kidney disease, or unspecified chronic kidney disease: Secondary | ICD-10-CM | POA: Diagnosis not present

## 2017-12-23 ENCOUNTER — Other Ambulatory Visit: Payer: Self-pay

## 2017-12-23 ENCOUNTER — Encounter (HOSPITAL_COMMUNITY): Payer: Self-pay | Admitting: Pharmacy Technician

## 2017-12-23 ENCOUNTER — Emergency Department (HOSPITAL_COMMUNITY): Payer: Medicare Other

## 2017-12-23 ENCOUNTER — Inpatient Hospital Stay (HOSPITAL_COMMUNITY)
Admission: EM | Admit: 2017-12-23 | Discharge: 2017-12-28 | DRG: 812 | Disposition: A | Discharge status: Home Health | Payer: Medicare Other | Attending: Internal Medicine | Admitting: Internal Medicine

## 2017-12-23 DIAGNOSIS — I959 Hypotension, unspecified: Secondary | ICD-10-CM | POA: Diagnosis present

## 2017-12-23 DIAGNOSIS — D689 Coagulation defect, unspecified: Secondary | ICD-10-CM | POA: Diagnosis present

## 2017-12-23 DIAGNOSIS — E1122 Type 2 diabetes mellitus with diabetic chronic kidney disease: Secondary | ICD-10-CM | POA: Diagnosis present

## 2017-12-23 DIAGNOSIS — I481 Persistent atrial fibrillation: Secondary | ICD-10-CM | POA: Diagnosis not present

## 2017-12-23 DIAGNOSIS — E785 Hyperlipidemia, unspecified: Secondary | ICD-10-CM | POA: Diagnosis present

## 2017-12-23 DIAGNOSIS — E1151 Type 2 diabetes mellitus with diabetic peripheral angiopathy without gangrene: Secondary | ICD-10-CM | POA: Diagnosis present

## 2017-12-23 DIAGNOSIS — I70219 Atherosclerosis of native arteries of extremities with intermittent claudication, unspecified extremity: Secondary | ICD-10-CM | POA: Diagnosis present

## 2017-12-23 DIAGNOSIS — Z952 Presence of prosthetic heart valve: Secondary | ICD-10-CM

## 2017-12-23 DIAGNOSIS — I252 Old myocardial infarction: Secondary | ICD-10-CM | POA: Diagnosis not present

## 2017-12-23 DIAGNOSIS — I4891 Unspecified atrial fibrillation: Secondary | ICD-10-CM | POA: Diagnosis not present

## 2017-12-23 DIAGNOSIS — Z23 Encounter for immunization: Secondary | ICD-10-CM

## 2017-12-23 DIAGNOSIS — E875 Hyperkalemia: Secondary | ICD-10-CM | POA: Diagnosis present

## 2017-12-23 DIAGNOSIS — E871 Hypo-osmolality and hyponatremia: Secondary | ICD-10-CM | POA: Diagnosis present

## 2017-12-23 DIAGNOSIS — N179 Acute kidney failure, unspecified: Secondary | ICD-10-CM | POA: Diagnosis present

## 2017-12-23 DIAGNOSIS — D500 Iron deficiency anemia secondary to blood loss (chronic): Secondary | ICD-10-CM | POA: Diagnosis present

## 2017-12-23 DIAGNOSIS — Z9989 Dependence on other enabling machines and devices: Secondary | ICD-10-CM

## 2017-12-23 DIAGNOSIS — I2510 Atherosclerotic heart disease of native coronary artery without angina pectoris: Secondary | ICD-10-CM

## 2017-12-23 DIAGNOSIS — Z888 Allergy status to other drugs, medicaments and biological substances status: Secondary | ICD-10-CM

## 2017-12-23 DIAGNOSIS — Z951 Presence of aortocoronary bypass graft: Secondary | ICD-10-CM | POA: Diagnosis not present

## 2017-12-23 DIAGNOSIS — D649 Anemia, unspecified: Secondary | ICD-10-CM | POA: Diagnosis not present

## 2017-12-23 DIAGNOSIS — D696 Thrombocytopenia, unspecified: Secondary | ICD-10-CM | POA: Diagnosis present

## 2017-12-23 DIAGNOSIS — I5042 Chronic combined systolic (congestive) and diastolic (congestive) heart failure: Secondary | ICD-10-CM | POA: Diagnosis present

## 2017-12-23 DIAGNOSIS — R531 Weakness: Secondary | ICD-10-CM | POA: Diagnosis not present

## 2017-12-23 DIAGNOSIS — Z886 Allergy status to analgesic agent status: Secondary | ICD-10-CM

## 2017-12-23 DIAGNOSIS — I5082 Biventricular heart failure: Secondary | ICD-10-CM | POA: Diagnosis present

## 2017-12-23 DIAGNOSIS — Z87891 Personal history of nicotine dependence: Secondary | ICD-10-CM

## 2017-12-23 DIAGNOSIS — Z79899 Other long term (current) drug therapy: Secondary | ICD-10-CM

## 2017-12-23 DIAGNOSIS — R42 Dizziness and giddiness: Secondary | ICD-10-CM | POA: Diagnosis not present

## 2017-12-23 DIAGNOSIS — I130 Hypertensive heart and chronic kidney disease with heart failure and stage 1 through stage 4 chronic kidney disease, or unspecified chronic kidney disease: Secondary | ICD-10-CM | POA: Diagnosis present

## 2017-12-23 DIAGNOSIS — J9611 Chronic respiratory failure with hypoxia: Secondary | ICD-10-CM | POA: Diagnosis present

## 2017-12-23 DIAGNOSIS — I50810 Right heart failure, unspecified: Secondary | ICD-10-CM | POA: Diagnosis present

## 2017-12-23 DIAGNOSIS — Z7951 Long term (current) use of inhaled steroids: Secondary | ICD-10-CM

## 2017-12-23 DIAGNOSIS — I10 Essential (primary) hypertension: Secondary | ICD-10-CM | POA: Diagnosis present

## 2017-12-23 DIAGNOSIS — G4733 Obstructive sleep apnea (adult) (pediatric): Secondary | ICD-10-CM | POA: Diagnosis present

## 2017-12-23 DIAGNOSIS — D62 Acute posthemorrhagic anemia: Secondary | ICD-10-CM | POA: Diagnosis present

## 2017-12-23 DIAGNOSIS — R0902 Hypoxemia: Secondary | ICD-10-CM | POA: Diagnosis not present

## 2017-12-23 DIAGNOSIS — E118 Type 2 diabetes mellitus with unspecified complications: Secondary | ICD-10-CM | POA: Diagnosis present

## 2017-12-23 DIAGNOSIS — R791 Abnormal coagulation profile: Secondary | ICD-10-CM | POA: Diagnosis present

## 2017-12-23 DIAGNOSIS — Z7901 Long term (current) use of anticoagulants: Secondary | ICD-10-CM

## 2017-12-23 DIAGNOSIS — I5032 Chronic diastolic (congestive) heart failure: Secondary | ICD-10-CM | POA: Diagnosis not present

## 2017-12-23 DIAGNOSIS — N183 Chronic kidney disease, stage 3 (moderate): Secondary | ICD-10-CM | POA: Diagnosis present

## 2017-12-23 DIAGNOSIS — E861 Hypovolemia: Secondary | ICD-10-CM | POA: Diagnosis present

## 2017-12-23 DIAGNOSIS — I482 Chronic atrial fibrillation: Secondary | ICD-10-CM | POA: Diagnosis present

## 2017-12-23 DIAGNOSIS — R58 Hemorrhage, not elsewhere classified: Secondary | ICD-10-CM | POA: Diagnosis not present

## 2017-12-23 DIAGNOSIS — Z7984 Long term (current) use of oral hypoglycemic drugs: Secondary | ICD-10-CM

## 2017-12-23 DIAGNOSIS — E876 Hypokalemia: Secondary | ICD-10-CM | POA: Diagnosis not present

## 2017-12-23 DIAGNOSIS — Z9981 Dependence on supplemental oxygen: Secondary | ICD-10-CM

## 2017-12-23 DIAGNOSIS — J449 Chronic obstructive pulmonary disease, unspecified: Secondary | ICD-10-CM | POA: Diagnosis present

## 2017-12-23 DIAGNOSIS — M109 Gout, unspecified: Secondary | ICD-10-CM | POA: Diagnosis present

## 2017-12-23 LAB — I-STAT TROPONIN, ED: Troponin i, poc: 0.01 ng/mL (ref 0.00–0.08)

## 2017-12-23 LAB — CBC WITH DIFFERENTIAL/PLATELET
BASOS PCT: 0 %
Basophils Absolute: 0 10*3/uL (ref 0.0–0.1)
EOS PCT: 0 %
Eosinophils Absolute: 0 10*3/uL (ref 0.0–0.7)
HCT: 16.3 % — ABNORMAL LOW (ref 39.0–52.0)
Hemoglobin: 4.9 g/dL — CL (ref 13.0–17.0)
LYMPHS ABS: 1.9 10*3/uL (ref 0.7–4.0)
Lymphocytes Relative: 20 %
MCH: 32.7 pg (ref 26.0–34.0)
MCHC: 30.1 g/dL (ref 30.0–36.0)
MCV: 108.7 fL — AB (ref 78.0–100.0)
Monocytes Absolute: 0.9 10*3/uL (ref 0.1–1.0)
Monocytes Relative: 10 %
NEUTROS ABS: 6.6 10*3/uL (ref 1.7–7.7)
NEUTROS PCT: 70 %
PLATELETS: 221 10*3/uL (ref 150–400)
RBC: 1.5 MIL/uL — AB (ref 4.22–5.81)
RDW: 22.6 % — ABNORMAL HIGH (ref 11.5–15.5)
WBC: 9.4 10*3/uL (ref 4.0–10.5)

## 2017-12-23 LAB — COMPREHENSIVE METABOLIC PANEL
ALBUMIN: 2.8 g/dL — AB (ref 3.5–5.0)
ALT: 8 U/L (ref 0–44)
AST: 24 U/L (ref 15–41)
Alkaline Phosphatase: 77 U/L (ref 38–126)
Anion gap: 15 (ref 5–15)
BUN: 42 mg/dL — ABNORMAL HIGH (ref 8–23)
CHLORIDE: 88 mmol/L — AB (ref 98–111)
CO2: 23 mmol/L (ref 22–32)
CREATININE: 2.39 mg/dL — AB (ref 0.61–1.24)
Calcium: 8.4 mg/dL — ABNORMAL LOW (ref 8.9–10.3)
GFR calc Af Amer: 28 mL/min — ABNORMAL LOW (ref >60)
GFR calc non Af Amer: 24 mL/min — ABNORMAL LOW (ref >60)
GLUCOSE: 123 mg/dL — AB (ref 70–99)
POTASSIUM: 5.2 mmol/L — AB (ref 3.5–5.1)
SODIUM: 126 mmol/L — AB (ref 135–145)
Total Bilirubin: 1.2 mg/dL (ref 0.3–1.2)
Total Protein: 5.8 g/dL — ABNORMAL LOW (ref 6.5–8.1)

## 2017-12-23 LAB — PREPARE RBC (CROSSMATCH)

## 2017-12-23 LAB — GLUCOSE, CAPILLARY: Glucose-Capillary: 116 mg/dL — ABNORMAL HIGH (ref 70–99)

## 2017-12-23 LAB — PROTIME-INR
INR: 4.32
PROTHROMBIN TIME: 41.1 s — AB (ref 11.4–15.2)

## 2017-12-23 LAB — MRSA PCR SCREENING: MRSA by PCR: NEGATIVE

## 2017-12-23 LAB — POC OCCULT BLOOD, ED: FECAL OCCULT BLD: POSITIVE — AB

## 2017-12-23 MED ORDER — SORBITOL 70 % SOLN
30.0000 mL | Freq: Every day | Status: DC | PRN
  Filled 2017-12-23: qty 30

## 2017-12-23 MED ORDER — ALLOPURINOL 100 MG PO TABS
100.0000 mg | ORAL_TABLET | Freq: Every day | ORAL | Status: DC
  Administered 2017-12-24 – 2017-12-28 (×5): 100 mg via ORAL
  Filled 2017-12-23 (×5): qty 1

## 2017-12-23 MED ORDER — MAGNESIUM CITRATE PO SOLN: 1.0000 | Freq: Once | ORAL | Status: DC | PRN

## 2017-12-23 MED ORDER — INSULIN ASPART 100 UNIT/ML ~~LOC~~ SOLN
0.0000 [IU] | Freq: Three times a day (TID) | SUBCUTANEOUS | Status: DC
  Administered 2017-12-24 – 2017-12-28 (×3): 1 [IU] via SUBCUTANEOUS

## 2017-12-23 MED ORDER — SODIUM CHLORIDE 0.9 % IV SOLN: INTRAVENOUS | Status: DC

## 2017-12-23 MED ORDER — DOCUSATE SODIUM 100 MG PO CAPS
100.0000 mg | ORAL_CAPSULE | Freq: Two times a day (BID) | ORAL | Status: DC
  Administered 2017-12-23 – 2017-12-28 (×10): 100 mg via ORAL
  Filled 2017-12-23 (×10): qty 1

## 2017-12-23 MED ORDER — TRANEXAMIC ACID 1000 MG/10ML IV SOLN
500.0000 mg | Freq: Once | INTRAVENOUS | Status: DC
  Filled 2017-12-23: qty 10

## 2017-12-23 MED ORDER — AMIODARONE HCL 200 MG PO TABS
200.0000 mg | ORAL_TABLET | Freq: Two times a day (BID) | ORAL | Status: DC
  Administered 2017-12-23 – 2017-12-25 (×5): 200 mg via ORAL
  Filled 2017-12-23 (×6): qty 1

## 2017-12-23 MED ORDER — HYDROCODONE-ACETAMINOPHEN 5-325 MG PO TABS: 1.0000 | ORAL_TABLET | ORAL | Status: DC | PRN

## 2017-12-23 MED ORDER — ALBUTEROL SULFATE (2.5 MG/3ML) 0.083% IN NEBU: 2.5000 mg | INHALATION_SOLUTION | RESPIRATORY_TRACT | Status: DC | PRN

## 2017-12-23 MED ORDER — LEVALBUTEROL HCL 0.63 MG/3ML IN NEBU: 0.6300 mg | INHALATION_SOLUTION | Freq: Four times a day (QID) | RESPIRATORY_TRACT | Status: DC | PRN

## 2017-12-23 MED ORDER — SODIUM CHLORIDE 0.45 % IV SOLN: INTRAVENOUS | Status: DC

## 2017-12-23 MED ORDER — SODIUM CHLORIDE 0.9% IV SOLUTION: Freq: Once | INTRAVENOUS | Status: DC

## 2017-12-23 MED ORDER — ONDANSETRON HCL 4 MG/2ML IJ SOLN: 4.0000 mg | Freq: Four times a day (QID) | INTRAMUSCULAR | Status: DC | PRN

## 2017-12-23 MED ORDER — TORSEMIDE 20 MG PO TABS
50.0000 mg | ORAL_TABLET | Freq: Two times a day (BID) | ORAL | Status: DC
  Administered 2017-12-24: 50 mg via ORAL
  Filled 2017-12-23: qty 3

## 2017-12-23 MED ORDER — INFLUENZA VAC SPLIT HIGH-DOSE 0.5 ML IM SUSY
0.5000 mL | PREFILLED_SYRINGE | INTRAMUSCULAR | Status: AC
  Administered 2017-12-24: 0.5 mL via INTRAMUSCULAR
  Filled 2017-12-23: qty 0.5

## 2017-12-23 MED ORDER — ONDANSETRON HCL 4 MG PO TABS: 4.0000 mg | ORAL_TABLET | Freq: Four times a day (QID) | ORAL | Status: DC | PRN

## 2017-12-23 MED ORDER — SENNOSIDES-DOCUSATE SODIUM 8.6-50 MG PO TABS: 1.0000 | ORAL_TABLET | Freq: Every evening | ORAL | Status: DC | PRN

## 2017-12-23 MED ORDER — WARFARIN - PHARMACIST DOSING INPATIENT: Freq: Every day | Status: DC

## 2017-12-23 MED ORDER — ACETAMINOPHEN 325 MG PO TABS: 650.0000 mg | ORAL_TABLET | Freq: Four times a day (QID) | ORAL | Status: DC | PRN

## 2017-12-23 MED ORDER — EZETIMIBE-SIMVASTATIN 10-20 MG PO TABS
1.0000 | ORAL_TABLET | Freq: Every day | ORAL | Status: DC
  Administered 2017-12-24 – 2017-12-27 (×4): 1 via ORAL
  Filled 2017-12-23 (×5): qty 1

## 2017-12-23 MED ORDER — TRAZODONE HCL 50 MG PO TABS: 50.0000 mg | ORAL_TABLET | Freq: Every evening | ORAL | Status: DC | PRN

## 2017-12-23 MED ORDER — FUROSEMIDE 10 MG/ML IJ SOLN: 20.0000 mg | Freq: Once | INTRAMUSCULAR | Status: DC

## 2017-12-23 MED ORDER — ACETAMINOPHEN 650 MG RE SUPP: 650.0000 mg | Freq: Four times a day (QID) | RECTAL | Status: DC | PRN

## 2017-12-23 MED ORDER — SODIUM CHLORIDE 0.45 % IV SOLN
INTRAVENOUS | Status: DC
  Administered 2017-12-23: 18:00:00 via INTRAVENOUS

## 2017-12-23 MED ORDER — SODIUM CHLORIDE 0.9 % IV BOLUS: 1000.0000 mL | Freq: Once | INTRAVENOUS | Status: DC

## 2017-12-23 MED ORDER — VITAMIN K1 10 MG/ML IJ SOLN
10.0000 mg | Freq: Once | INTRAVENOUS | Status: DC
  Filled 2017-12-23: qty 1

## 2017-12-23 MED ORDER — PHYTONADIONE 5 MG PO TABS
2.5000 mg | ORAL_TABLET | Freq: Once | ORAL | Status: AC
  Administered 2017-12-23: 2.5 mg via ORAL
  Filled 2017-12-23: qty 1

## 2017-12-23 MED ORDER — ACETAMINOPHEN 325 MG PO TABS: 650.0000 mg | ORAL_TABLET | Freq: Once | ORAL | Status: DC

## 2017-12-23 NOTE — ED Provider Notes (Signed)
[facility] EMERGENCY DEPARTMENT Provider Note   CSN: [id number] Arrival date & time: 12/23/17  1240     History   Chief Complaint Chief Complaint  Patient presents with  . Weakness    HPI [name] is a 82 y.o. male.  HPI   Patient is an 82 year old male with history of CAD s/p left carotid endarterectomy and CABG, chronic afib (on coumadin), diastolic CHF, H6DJ, s/p AVR, subclavian bypass stenosis who presents to the ED today c/o generlalized weakness and fatigue.  Patient states that he has felt lightheaded and like he is about to pass out for the last 1 to 2 days.  He states that he had a tooth extraction recently and has had continued bleeding from his mouth since the procedure.  States he was scheduled for an outpatient blood transfusion but has not had it yet.  He states he has been compliant with Coumadin.  Shortness of breath.  No headaches vision changes or unilateral weakness.  Reviewed records.  Patient was admitted on 12/12/2017 for planned procedure of dental extraction for chronic apical periodontitis with Dr. Dorothyann Gibbs.  Hospital course complicated by volume overload and postop anemia as well as a run of V. tach in the setting of hypokalemia.  Patient discharged in stable condition 12/14/2017.  Patient seen 12/20/2017 in heart failure clinic complaining of continued bleeding from his gums.  At that time denied dizziness lightheadedness melena or bright blood blood per rectum. Clinic had planned to arrange for 1 uPRBCs at that time.  Past Medical History:  Diagnosis Date  . Carotid artery disease ([place]) 1994   s/p left carotid endarerectomy   . Chronic atrial fibrillation (HCC)    a. on coumadin   . Chronic diastolic CHF (congestive heart failure) ([place])   . Diabetes mellitus without complication ([place])    dx 2016  . Dyspnea   . Heart murmur   . Hx of CABG    a. 1994  . Hypertension   . OSA (obstructive sleep apnea)   . Rheumatic fever   . S/P  AVR (aortic valve replacement)    a. mechanical valve 1996  . Subclavian bypass stenosis ([place])   . 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 Vitamin B12 deficiency     Patient Active Problem List   Diagnosis Date Noted  . Severe anemia 12/23/2017  . Supratherapeutic INR 12/23/2017  . Hyponatremia 12/23/2017  . Coronary artery disease with history of myocardial infarction without history of CABG 12/23/2017  . Bleeding from mouth 12/20/2017  . Post-op pain 12/12/2017  . Dental caries 12/12/2017  . Severe mitral regurgitation 11/14/2017  . Pressure injury of skin 10/29/2017  . Shortness of breath   . Palliative care by specialist   . RVF (right ventricular failure) ([place])   . Right heart failure ([place]) 10/18/2017  . Hypoxia   . COPD exacerbation ([place]) 08/01/2017  . Pancytopenia ([place]) 08/01/2017  . Epistaxis 07/11/2017  . Actinic keratoses 04/05/2017  . Leg wound, right 02/18/2016  . Edema 02/18/2016  . Chronic anticoagulation 02/18/2016  . Hyperkalemia 07/25/2015  . Atherosclerosis of native arteries of extremity with intermittent claudication ([place]) 10/21/2014  . Thrombocytopenia ([place]) 10/20/2014  . Macrocytosis without anemia 10/20/2014  . OSA (obstructive sleep apnea) 05/15/2013  . Goals of care, counseling/discussion 05/01/2013  . History of mechanical aortic valve replacement 01/02/2013  . Obesity (BMI 30-39.9) 10/20/2012  . Type II diabetes mellitus with  complication ([place]) 23/55/7322  . Acute asthmatic bronchitis 01/18/2012  . ACUTE KIDNEY FAILURE UNSPECIFIED 02/04/2009  . CUTANEOUS ERUPTIONS, DRUG-INDUCED 02/04/2009  . HLD (hyperlipidemia) 02/03/2009  . Essential hypertension 02/03/2009  . Coronary atherosclerosis 02/03/2009  . Atrial fibrillation ([place]) 02/03/2009  . Chronic diastolic heart failure ([place]) 02/03/2009  . CAROTID ENDARTERECTOMY, LEFT, HX OF 02/03/2009  . INF&INFLAM REACT DUE CARD DEVICE  IMPLANT&GRAFT 12/30/2008    Past Surgical History:  Procedure Laterality Date  . [place]   replaced due to aortic stenosis, [employer] mechanical prostesis  . CAROTID ENDARTERECTOMY Left 1997   subclavian bypass Done in [place]  . CORONARY ARTERY BYPASS GRAFT  1994   w SVG to RCA and SVG to circumflex  . heart bypass     Done in [place]  . MULTIPLE EXTRACTIONS WITH ALVEOLOPLASTY Bilateral 12/12/2017   Procedure: Extraction of tooth #'s 1, 12,13,14,15,17,18,19, 29, and 30 with alveoloplasty and gross debridement of remaining teeth`;  Surgeon: Lenn Cal, DDS;  Location: [facility];  Service: Oral Surgery;  Laterality: Bilateral;  MULTIPLE EXTRACTION WITH ALVEOLOPLASTY WITH PRE PROSTHETIC SURGERY AND GROSS DEBRIDEMENT OF REMAINING TEETH  . TEE WITHOUT CARDIOVERSION N/A 10/23/2017   Procedure: TRANSESOPHAGEAL ECHOCARDIOGRAM (TEE);  Surgeon: Larey Dresser, MD;  Location: [facility] ENDOSCOPY;  Service: Cardiovascular;  Laterality: N/A;  . TONSILLECTOMY          Home Medications    Prior to Admission medications   Medication Sig Start Date End Date Taking? Authorizing Provider  allopurinol (ZYLOPRIM) 100 MG tablet Take 1 tablet (100 mg total) by mouth daily. 12/05/17  Yes Georgiana Shore, NP  amiodarone (PACERONE) 200 MG tablet Take 1 tablet (200 mg total) by mouth 2 (two) times daily. 12/14/17  Yes Shirley Friar, PA-C  ezetimibe-simvastatin (VYTORIN) 10-20 MG tablet Take 1 tablet by mouth at bedtime. 04/05/17  Yes [name], [name], MD  torsemide (DEMADEX) 100 MG tablet Take 1 tablet (100 mg total) by mouth 2 (two) times daily. 12/05/17  Yes Georgiana Shore, NP    Family History Family History  Problem Relation Age of Onset  . Cancer Father        lung   . Hypertension Mother     Social History Social History   Tobacco Use  . Smoking status: Former Smoker    Packs/day: 1.00    Years: 30.00    Pack years: 30.00    Types: Cigarettes    Last attempt to  quit: 04/03/1992    Years since quitting: 25.7  . Smokeless tobacco: Never Used  Substance Use Topics  . Alcohol use: Yes    Comment: 4-6 ounces of wine daily  . Drug use: No    Comment: Half a cup a day.     Allergies   Rifampin and Vancomycin   Review of Systems Review of Systems  Constitutional: Positive for fatigue.  HENT: Positive for dental problem.        Bleeding from procedure site  Eyes: Negative for visual disturbance.  Respiratory: Negative for shortness of breath.   Cardiovascular: Negative for chest pain and leg swelling.  Gastrointestinal: Negative for abdominal pain, blood in stool, diarrhea, nausea and vomiting.  Genitourinary: Negative for dysuria and hematuria.  Musculoskeletal: Negative for back pain and neck pain.  Skin: Negative for color change.  Neurological: Positive for weakness and light-headedness. Negative for syncope, numbness and headaches.     Physical Exam Updated Vital Signs BP (!) 97/43 (BP Location:  Left Arm)   Pulse 65   Temp 99.2 F (37.3 C) (Oral)   Resp 17   Ht 6\' 2"  (1.88 m)   Wt 100.8 kg   SpO2 97%   BMI 28.53 kg/m   Physical Exam  Constitutional: He appears well-developed and well-nourished. No distress.  pale  HENT:  Head: Normocephalic and atraumatic.  Slow bleeding from upper and lower molars on left side of mouth. Large clot to left upper mouth.  Eyes: Pupils are equal, round, and reactive to light. EOM are normal.  Pale conjunctiva  Neck: Neck supple.  Cardiovascular: Normal heart sounds and intact distal pulses.  No murmur heard. Irregularly irregular  Pulmonary/Chest: Effort normal and breath sounds normal. No stridor. No respiratory distress. He has no wheezes.  Abdominal: Soft. Bowel sounds are normal. He exhibits no distension. There is no tenderness. There is no guarding.  Musculoskeletal: He exhibits no edema.  Neurological: He is alert. No cranial nerve deficit.  Moving all extremities with normal  strength and sensation  Skin: Skin is warm and dry.  Psychiatric: He has a normal mood and affect.  Nursing note and vitals reviewed.  ED Treatments / Results  Labs (all labs ordered are listed, but only abnormal results are displayed) Labs Reviewed  CBC WITH DIFFERENTIAL/PLATELET - Abnormal; Notable for the following components:      Result Value   RBC 1.50 (*)    Hemoglobin 4.9 (*)    HCT 16.3 (*)    MCV 108.7 (*)    RDW 22.6 (*)    All other components within normal limits  PROTIME-INR - Abnormal; Notable for the following components:   Prothrombin Time 41.1 (*)    INR 4.32 (*)    All other components within normal limits  COMPREHENSIVE METABOLIC PANEL - Abnormal; Notable for the following components:   Sodium 126 (*)    Potassium 5.2 (*)    Chloride 88 (*)    Glucose, Bld 123 (*)    BUN 42 (*)    Creatinine, Ser 2.39 (*)    Calcium 8.4 (*)    Total Protein 5.8 (*)    Albumin 2.8 (*)    GFR calc non Af Amer 24 (*)    GFR calc Af Amer 28 (*)    All other components within normal limits  POC OCCULT BLOOD, ED - Abnormal; Notable for the following components:   Fecal Occult Bld POSITIVE (*)    All other components within normal limits  MRSA PCR SCREENING  HEMOGLOBIN AND HEMATOCRIT, BLOOD  HEMOGLOBIN AND HEMATOCRIT, BLOOD  PROTIME-INR  CALCIUM  MAGNESIUM  PHOSPHORUS  BRAIN NATRIURETIC PEPTIDE  TSH  TROPONIN I  TROPONIN I  TROPONIN I  HEMOGLOBIN A1C  URINALYSIS, ROUTINE W REFLEX MICROSCOPIC  CBC  APTT  BASIC METABOLIC PANEL  PROTIME-INR  I-STAT TROPONIN, ED  TYPE AND SCREEN  PREPARE RBC (CROSSMATCH)  PREPARE RBC (CROSSMATCH)    EKG EKG Interpretation  Date/Time:  Sunday December 23 2017 12:54:44 EDT Ventricular Rate:  76 PR Interval:    QRS Duration: 118 QT Interval:  374 QTC Calculation: 421 R Axis:   57 Text Interpretation:  Atrial fibrillation Nonspecific intraventricular conduction delay Repol abnrm suggests ischemia, anterolateral Confirmed by  Gerlene Fee [phone]) on 12/23/2017 3:33:44 PM   Radiology Dg Chest Portable 1 View  Result Date: 12/23/2017 CLINICAL DATA:  Weakness, fatigue EXAM: PORTABLE CHEST 1 VIEW COMPARISON:  10/24/2017 FINDINGS: Lungs are clear.  No pleural effusion or pneumothorax. Cardiomegaly. Prominent pulmonary  markings in the bilateral lower lobes. Median sternotomy. IMPRESSION: No evidence of acute cardiopulmonary disease. Electronically Signed   By: Julian Hy M.D.   On: 12/23/2017 14:15    Procedures Procedures (including critical care time) CRITICAL CARE Performed by: Rodney Booze   Total critical care time: 35 minutes  Critical care time was exclusive of separately billable procedures and treating other patients.  Critical care was necessary to treat or prevent imminent or life-threatening deterioration.  Critical care was time spent personally by me on the following activities: development of treatment plan with patient and/or surrogate as well as nursing, discussions with consultants, evaluation of patient's response to treatment, examination of patient, obtaining history from patient or surrogate, ordering and performing treatments and interventions, ordering and review of laboratory studies, ordering and review of radiographic studies, pulse oximetry and re-evaluation of patient's condition.   Medications Ordered in ED Medications  0.9 %  sodium chloride infusion (Manually program via Guardrails IV Fluids) (has no administration in time range)  tranexamic acid (CYKLOKAPRON) injection 500 mg (has no administration in time range)  acetaminophen (TYLENOL) tablet 650 mg (has no administration in time range)    Or  acetaminophen (TYLENOL) suppository 650 mg (has no administration in time range)  HYDROcodone-acetaminophen (NORCO/VICODIN) 5-325 MG per tablet 1-2 tablet (has no administration in time range)  traZODone (DESYREL) tablet 50 mg (has no administration in time range)  docusate  sodium (COLACE) capsule 100 mg (has no administration in time range)  senna-docusate (Senokot-S) tablet 1 tablet (has no administration in time range)  sorbitol 70 % solution 30 mL (has no administration in time range)  magnesium citrate solution 1 Bottle (has no administration in time range)  ondansetron (ZOFRAN) tablet 4 mg (has no administration in time range)    Or  ondansetron (ZOFRAN) injection 4 mg (has no administration in time range)  albuterol (PROVENTIL) (2.5 MG/3ML) 0.083% nebulizer solution 2.5 mg (has no administration in time range)  levalbuterol (XOPENEX) nebulizer solution 0.63 mg (has no administration in time range)  Warfarin - Pharmacist Dosing Inpatient (has no administration in time range)  0.9 %  sodium chloride infusion (Manually program via Guardrails IV Fluids) (has no administration in time range)  acetaminophen (TYLENOL) tablet 650 mg (has no administration in time range)  furosemide (LASIX) injection 20 mg (has no administration in time range)  furosemide (LASIX) injection 20 mg (has no administration in time range)  0.45 % sodium chloride infusion ( Intravenous New Bag/Given 12/23/17 1820)  allopurinol (ZYLOPRIM) tablet 100 mg (has no administration in time range)  amiodarone (PACERONE) tablet 200 mg (has no administration in time range)  ezetimibe-simvastatin (VYTORIN) 10-20 MG per tablet 1 tablet (has no administration in time range)  torsemide (DEMADEX) tablet 50 mg (has no administration in time range)  insulin aspart (novoLOG) injection 0-9 Units (has no administration in time range)  Influenza vac split quadrivalent PF (FLUZONE HIGH-DOSE) injection 0.5 mL (has no administration in time range)  phytonadione (VITAMIN K) tablet 2.5 mg (2.5 mg Oral Given 12/23/17 1614)     Initial Impression / Assessment and Plan / ED Course  I have reviewed the triage vital signs and the nursing notes.  Pertinent labs & imaging results that were available during my care of  the patient were reviewed by me and considered in my medical decision making (see chart for details).    Discussed pt presentation and exam findings with Dr. Sedonia Small, who evaluated the pt agrees with the current workup  and plan. He discussed case with pharmacy and recommends giving small dose of vitamin K, but holding on 4-factor [facility] due to pt h/o valve replacement and need for anticoagulation.   Rechecked pt. Blood is running. Watching tv in NAD. Bleeding has slowed and almost stopped in his mouth. Sutures intact the left lower mouth, clot present to left upper mouth. Discussed findings and plan for admission. Pt in agreement with plan.  Final Clinical Impressions(s) / ED Diagnoses   Final diagnoses:  Symptomatic anemia  Elevated INR  Hyperkalemia  AKI (acute kidney injury) ([place])   Pt with generalized weakness, fatigue and near syncope.  Had recent dental procedure and has had bleeding from the mouth for almost a week.  Is currently on Coumadin for chronic A. fib and history of AVR.  Was scheduled for outpatient transfusion of 1 unit PRBCs, but has not had this procedure completed yet.  Appears pale today with active bleeding in the mouth. borderline blood pressures however appears consistent with patient's baseline.  Appears hemodynamically stable in the ED.  CBC with hbg 4.9. No leukocytosis. 2 units PRBC ordered. PT-INR elevated at 4.32.  CMP with elevated K at 5.2, no piqued t-waves on ecg. hypernatremia at 126, asx. Elevated BUN and Cr at 42 and 2.39, worse from baseline. POC hemoccult positive, however lower suspicion for GI source as pt denies BRBPR and melena. More likely that pt is swallowing blood.   ECG with afib, nonspecific IVCD. Some evidence of demand ischemia in anterolateral leads. Seen on prior ECG, but somewhat worse today. No piqued t-waves. Trop negative.  CXR without evidence of PNA.  Will plan to admit for symptomatic anemia with ongoing bleeding in setting of elevated  pt-inr. Vitamin k and PRBCs given in ED. Pt has remained stable.  4:17 PM CONSULT wit hospitalist service, Dr. Roger Shelter who will see the patient.   ED Discharge Orders    None       Bishop Dublin 12/23/17 1940    Maudie Flakes, MD 12/23/17 2310

## 2017-12-23 NOTE — H&P (Addendum)
History and Physical   Patient: [name]                            PCP: Cassandria Anger, MD                    DOB: 1935-05-29            DOA: 12/23/2017 IEP:[id number]             DOS: 12/23/2017, 4:47 PM  Patient coming from: Home  I have personally reviewed patient's medical records, in electronic medical records, including: [facility], and care everywhere.    Chief Complaint:   Chief Complaint  Patient presents with  . Weakness    HPI:    [name] is a 82 y.o. male with medical history significant of mechanical aortic valve 1994, OSA on CPAP, permanent A. fib, on chronic anticoagulant therapy with Coumadin, chronic combined CHF, CKD stage III, diabetes mellitus, coronary artery disease with CABG 1994, COPD, severe mitral regurgitation, carotid stenosis with left carotid enterotomy,HTN, HLD, presented to ED with generalized weaknesses, headache.  States he feels as if he was going to pass out.  His symptoms started about 1 to 2 days ago. He reports that he has had a tooth extraction recently and since extraction he continued to bleed, bleeding has not stopped.  Dating to his Coumadin was not stopped for the procedure.  And he has been compliant with his medication including Coumadin. Complaining of mild shortness of breath but denies any headaches or asymmetric weaknesses aside from generalized weaknesses.  Electronic medical records were reviewed patient was admitted on 12/12/2017 for planned procedure of dental extraction for chronic apical periodontitis by Dr.Kalinsky.  Hospital course was complicated by postop anemia, V. tach, hypokalemia, volume overload.  Patient was subsequently stabilized and was discharged on 12/14/2017.  Patient was subsequently seen at heart failure clinic on 12/20/2017 reported continue to have steady bleeding from his gums.  At the time patient denied have any symptoms.  Is apparently clinic had plan to arrange for 1 unit of blood transfusion  -   Currently he is feeling generalized weaknesses, lightheadedness, he is afebrile, hypotensive with a blood pressure 88/51. Aside from a tooth extraction he denies of any fever, chills, nausea, vomiting.  Denies any headaches visual changes asymmetric weaknesses though severe generalized weaknesses.  Denies any chest pain.  Mild shortness of breath.  Denies any abdominal pain constipation or diarrhea.  Denies any joint pain.  Denies any open wound with exception that his mouth tooth extraction.  Denies any dysuria.  ED Course:, Fairly stable Patient vitals were stable with exception of hypotension blood pressure 88/51,  Hemoglobin 4.9, sodium 126, potassium 5.2, INR 4.32, BUN 42 creatinine 2.39  Patient was treated with bolus of 250 mL of normal saline, 2.5 mg of vitamin K IV, Lasix.  Patient primarily follows with the family practice.  But they are capped at this time.  Subsequently were called to admit the patient to stepdown unit. Requested possible stat consultation to oral surgeon to stop the bleeding.    Review of Systems: As per HPI otherwise 12 point review of systems negative.    Assessment / Plan:   Principal Problem:   Severe anemia Active Problems:   HLD (hyperlipidemia)   Essential hypertension   Atrial fibrillation (HCC)   Chronic diastolic heart failure (HCC)   Type II diabetes mellitus with complication ([place])  Atherosclerosis of native arteries of extremity with intermittent claudication (HCC)   Hyperkalemia   Chronic anticoagulation   Supratherapeutic INR   Hyponatremia   Coronary artery disease with history of myocardial infarction without history of CABG  Severe anemia/active, bleeding from a from extracted tooth -hemoglobin 4.9 -Patient will be admitted to stepdown unit,t  -Consulted PCCM, Dr. Sabra Heck -evaluate the patient accordingly -Patient declined patient will be transferred to ICU  -Patient is been ordered for 2 units of packed red blood cell to  be transfused in ED -Proceed with 2 units of packed red blood cell transfusion slowly, due to his significant heart failure, along with Lasix in between and after. -We will monitor very closely -Monitor H&H every 6 hours  Active bleeding site from tooth extraction -We have asked the ED staff, ED physician, either to manually packed the site -We will consult oral surgeon to assist -Please call Dr. Teena Dunk DDS, who was extracted is to persist in a morning -Continue to monitor very closely,  Supratherapeutic INR -patient is chronically on Coumadin due to mechanical aortic valve and chronic A. Fib -Currently INR is 4.32, patient is received 2.5 mg of IV vitamin K in ED -Monitor INR closely -Consulting pharmacy to assist with dosing Coumadin  Hypotensive/hypovolemia -We will continue to monitor closely, obviously holding blood pressure medication, -Anticipating improved with blood transfusion, and very gentle IV fluid hydration -We will monitor very closely  Acute renal insufficiency- due to severe anemia, hypotension/hypovolemia -Avoiding nephrotoxins -Anticipating to improve with blood transfusion, volume repletion -Monitoring BUN/creatinine closely  Hyponatremia -Secondary to volume depletion -We will continue with gentle volume resuscitation, blood transfusion -Monitoring closely  Hyperkalemia -Dissipating to improve with volume repletion, currently no changes in EKG -We will monitor closely  Active Problems:  HLD (hyperlipidemia) -continue statins    Essential hypertension -hypotensive holding blood pressure medication    Atrial fibrillation (HCC) -continue amiodarone, dosing Coumadin    Chronic systolic/diastolic CHF  -holding torsemide, if improves, seems patient is 100 mg p.o. twice daily will start on half a dose in a.m. Last echo 10/23/2017, EJF 55-60%, sever MR, mechanical AV  Type II diabetes mellitus with complication (HCC) -holding metformin,  CBG,SSI  Coronary artery disease with history of myocardial infarction without history of CABG Continue medication including Coumadin when patient is more stable  DVT prophylaxis: On Coumadin supratherapeutic INR, SCDs,  Code Status:   Code Status: Full Code  Family Communication:  The above findings and plan of care has been discussed with patient and family in detail, they expressed understanding and agreement of above plan.   Disposition Plan: >3 days  Consults called: PCCM Dr. Sabra Heck  Admission status: Patient will be admitted as Inpatient, with a greater than 2 midnight length of stay.  ----------------------------------------------------------------------------------------------------------------------  Allergies  Allergen Reactions  . Rifampin Rash    May have been caused by Vancomycin or Rifampin (??)  . Vancomycin Rash    May have been caused by Vancomycin or Rifampin (??)    Home MEDs:  Prior to Admission medications   Medication Sig Start Date End Date Taking? Authorizing Provider  allopurinol (ZYLOPRIM) 100 MG tablet Take 1 tablet (100 mg total) by mouth daily. 12/05/17  Yes Georgiana Shore, NP  amiodarone (PACERONE) 200 MG tablet Take 1 tablet (200 mg total) by mouth 2 (two) times daily. 12/14/17  Yes Shirley Friar, PA-C  ezetimibe-simvastatin (VYTORIN) 10-20 MG tablet Take 1 tablet by mouth at bedtime. 04/05/17  Yes [name], [name],  MD  torsemide (DEMADEX) 100 MG tablet Take 1 tablet (100 mg total) by mouth 2 (two) times daily. 12/05/17  Yes Georgiana Shore, NP    PRN MEDs: acetaminophen **OR** acetaminophen, albuterol, HYDROcodone-acetaminophen, levalbuterol, magnesium citrate, ondansetron **OR** ondansetron (ZOFRAN) IV, senna-docusate, sorbitol, traZODone  Past Medical History:  Diagnosis Date  . Carotid artery disease ([place]) 1994   s/p left carotid endarerectomy   . Chronic atrial fibrillation (HCC)    a. on coumadin   . Chronic diastolic CHF  (congestive heart failure) ([place])   . Diabetes mellitus without complication ([place])    dx 2016  . Dyspnea   . Heart murmur   . Hx of CABG    a. 1994  . Hypertension   . OSA (obstructive sleep apnea)   . Rheumatic fever   . S/P AVR (aortic valve replacement)    a. mechanical valve 1996  . Subclavian bypass stenosis ([place])   . 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 Vitamin B12 deficiency     Past Surgical History:  Procedure Laterality Date  . [place]   replaced due to aortic stenosis, [employer] mechanical prostesis  . CAROTID ENDARTERECTOMY Left 1997   subclavian bypass Done in [place]  . CORONARY ARTERY BYPASS GRAFT  1994   w SVG to RCA and SVG to circumflex  . heart bypass     Done in [place]  . MULTIPLE EXTRACTIONS WITH ALVEOLOPLASTY Bilateral 12/12/2017   Procedure: Extraction of tooth #'s 1, 12,13,14,15,17,18,19, 29, and 30 with alveoloplasty and gross debridement of remaining teeth`;  Surgeon: Lenn Cal, DDS;  Location: [facility];  Service: Oral Surgery;  Laterality: Bilateral;  MULTIPLE EXTRACTION WITH ALVEOLOPLASTY WITH PRE PROSTHETIC SURGERY AND GROSS DEBRIDEMENT OF REMAINING TEETH  . TEE WITHOUT CARDIOVERSION N/A 10/23/2017   Procedure: TRANSESOPHAGEAL ECHOCARDIOGRAM (TEE);  Surgeon: Larey Dresser, MD;  [facility];  Service: Cardiovascular;  Laterality: N/A;  . TONSILLECTOMY       reports that he quit smoking about 25 years ago. His smoking use included cigarettes. He has a 30.00 pack-year smoking history. He has never used smokeless tobacco. He reports that he drinks alcohol. He reports that he does not use drugs.   Family History  Problem Relation Age of Onset  . Cancer Father        lung   . Hypertension Mother     Physical Exam:   Vitals:   12/23/17 1515 12/23/17 1530 12/23/17 1556 12/23/17 1613  BP: (!) 93/48 106/68 (!) 94/54 (!) 88/51  Pulse: 70 77 71 66   Resp: 18 15 18 20   Temp:   98.1 F (36.7 C) 98.3 F (36.8 C)  TempSrc:   Oral Oral  SpO2:   99% 98%  Weight:      Height:        Constitutional: NAD, calm, comfortable Vitals:   12/23/17 1515 12/23/17 1530 12/23/17 1556 12/23/17 1613  BP: (!) 93/48 106/68 (!) 94/54 (!) 88/51  Pulse: 70 77 71 66  Resp: 18 15 18 20   Temp:   98.1 F (36.7 C) 98.3 F (36.8 C)  TempSrc:   Oral Oral  SpO2:   99% 98%  Weight:      Height:       Eyes: PERRL, lids and conjunctivae normal ENMT: Mucous membranes are moist. Posterior pharynx clear of any exudate or lesions.Normal dentition.  Neck: normal, supple, no masses, no  thyromegaly Respiratory: clear to auscultation bilaterally, no wheezing, no crackles. Normal respiratory effort. No accessory muscle use.  Cardiovascular: Regular rate and rhythm, no murmurs / rubs / gallops. No extremity edema. 2+ pedal pulses. No carotid bruits.  Abdomen: no tenderness, no masses palpated. No hepatosplenomegaly. Bowel sounds positive.  Musculoskeletal: no clubbing / cyanosis. No joint deformity upper and lower extremities. Good ROM, no contractures. Normal muscle tone.  Neurologic: CN II-XII grossly intact. Sensation intact, DTR normal. Strength 5/5 in all 4.  Psychiatric: Normal judgment and insight. Alert and oriented x 3. Normal mood.  Skin: no rashes, lesions, ulcers. No induration Decubitus/ulcers:  Urinary catheter: Chronic indwelling/was placed in this admission  Labs on admission:    I have personally reviewed following labs and imaging studies  CBC: Recent Labs  Lab 12/20/17 1428 12/23/17 1259  WBC 6.9 9.4  NEUTROABS  --  6.6  HGB 7.1* 4.9*  HCT 23.3* 16.3*  MCV 105.9* 108.7*  PLT 179 841   Basic Metabolic Panel: Recent Labs  Lab 12/20/17 1428 12/23/17 1259  NA 134* 126*  K 4.4 5.2*  CL 90* 88*  CO2 29 23  GLUCOSE 118* 123*  BUN 37* 42*  CREATININE 1.92* 2.39*  CALCIUM 9.4 8.4*   GFR: Estimated Creatinine Clearance: 31.2  mL/min (A) (by C-G formula based on SCr of 2.39 mg/dL (H)). Liver Function Tests: Recent Labs  Lab 12/23/17 1259  AST 24  ALT 8  ALKPHOS 77  BILITOT 1.2  PROT 5.8*  ALBUMIN 2.8*   No results for input(s): LIPASE, AMYLASE in the last 168 hours. No results for input(s): AMMONIA in the last 168 hours. Coagulation Profile: Recent Labs  Lab 12/17/17 12/20/17 1428 12/23/17 1259  INR 2.9 2.81 4.32*   Cardiac Enzymes: No results for input(s): CKTOTAL, CKMB, CKMBINDEX, TROPONINI in the last 168 hours. BNP (last 3 results) Recent Labs    03/30/17 1147 05/25/17 1044 07/10/17 1523  PROBNP 1,858* 2,749* 411.0*   Urine analysis:    Component Value Date/Time   COLORURINE YELLOW 11/03/2017 1419   APPEARANCEUR CLEAR 11/03/2017 1419   LABSPEC 1.009 11/03/2017 1419   PHURINE 9.0 (H) 11/03/2017 1419   GLUCOSEU NEGATIVE 11/03/2017 1419   GLUCOSEU NEGATIVE 02/09/2014 0848   HGBUR NEGATIVE 11/03/2017 1419   BILIRUBINUR NEGATIVE 11/03/2017 1419   BILIRUBINUR Neg 08/14/2014 1051   KETONESUR NEGATIVE 11/03/2017 1419   PROTEINUR NEGATIVE 11/03/2017 1419   UROBILINOGEN 0.2 08/14/2014 1051   UROBILINOGEN 0.2 02/09/2014 0848   NITRITE NEGATIVE 11/03/2017 1419   LEUKOCYTESUR NEGATIVE 11/03/2017 1419     Radiologic Exams on Admission:   Dg Chest Portable 1 View  Result Date: 12/23/2017 CLINICAL DATA:  Weakness, fatigue EXAM: PORTABLE CHEST 1 VIEW COMPARISON:  10/24/2017 FINDINGS: Lungs are clear.  No pleural effusion or pneumothorax. Cardiomegaly. Prominent pulmonary markings in the bilateral lower lobes. Median sternotomy. IMPRESSION: No evidence of acute cardiopulmonary disease. Electronically Signed   By: Julian Hy M.D.   On: 12/23/2017 14:15    EKG:   Independently reviewed.   Orders placed or performed during the hospital encounter of 12/23/17  . EKG 12-Lead  . EKG 12-Lead  . EKG 12-Lead   Time spent: > Greater than 65 minutes of critical care time was spent in  admitting this patient. Area 50% of time was spent in reviewing all electronic medical records, stabilizing the patient, coordinating care with consultant, PCCM.  Deatra James MD [facility] ,  Pager [phone]  If 7PM-7AM, please contact night-coverage  [email or web address]  Password [facility] 12/23/2017, 4:47 PM

## 2017-12-23 NOTE — Progress Notes (Signed)
ANTICOAGULATION CONSULT NOTE - Initial Consult  Pharmacy Consult for warfarin Indication: atrial fibrillation and mechanical valve  Allergies  Allergen Reactions  . Rifampin Rash    May have been caused by Vancomycin or Rifampin (??)  . Vancomycin Rash    May have been caused by Vancomycin or Rifampin (??)    Patient Measurements: Height: 6\' 2"  (188 cm) Weight: 222 lb 3.6 oz (100.8 kg) IBW/kg (Calculated) : 82.2  Vital Signs: Temp: 98.3 F (36.8 C) (09/22 1613) Temp Source: Oral (09/22 1613) BP: 88/51 (09/22 1613) Pulse Rate: 66 (09/22 1613)  Labs: Recent Labs    12/23/17 1259  HGB 4.9*  HCT 16.3*  PLT 221  LABPROT 41.1*  INR 4.32*  CREATININE 2.39*    Estimated Creatinine Clearance: 31.2 mL/min (A) (by C-G formula based on SCr of 2.39 mg/dL (H)).   Medical History: Past Medical History:  Diagnosis Date  . Carotid artery disease ([place]) 1994   s/p left carotid endarerectomy   . Chronic atrial fibrillation (HCC)    a. on coumadin   . Chronic diastolic CHF (congestive heart failure) ([place])   . Diabetes mellitus without complication ([place])    dx 2016  . Dyspnea   . Heart murmur   . Hx of CABG    a. 1994  . Hypertension   . OSA (obstructive sleep apnea)   . Rheumatic fever   . S/P AVR (aortic valve replacement)    a. mechanical valve 1996  . Subclavian bypass stenosis ([place])   . 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 Vitamin B12 deficiency     Assessment: 57 yom presented with weakness and bleeding from his mouth. Pt is on chronic warfarin for history of afib and mechanical AVR. INR is supratherapeutic at 4.32. Hgb is low at 4.9. Already given 2.5mg  PO vitamin K and tranexamic acid topical has been ordered.   Goal of Therapy:  INR 2.5-3.5 Monitor platelets by anticoagulation protocol: Yes   Plan:  No warfarin today Daily INR F/u bleeding  Garik Diamant, Rande Lawman 12/23/2017,4:36 PM

## 2017-12-23 NOTE — ED Triage Notes (Signed)
Pt arrives via EMS from home with reports of generalized weakness/fatigue. Pt states was had molars removed almost 2 weeks ago. Pt takes coumadin and has been spitting blood up. Pt due for blood transfusion last week. Pale on arrival.  100/45, HR 60's, 96% 2L [place] (pt on 2L continuous).

## 2017-12-23 NOTE — ED Notes (Signed)
Pt placed on bedpan

## 2017-12-23 NOTE — Progress Notes (Signed)
Pts BP running in high 02'H and 61'R systolic. Pt has a hx of CHF and fluid overload. Pt is asymptomatic and receiving blood. MD made aware.

## 2017-12-23 NOTE — Consult Note (Signed)
NAME:  [name], MRN:  [id number], DOB:  [birth year], LOS: 0 ADMISSION DATE:  12/23/2017, CONSULTATION DATE:  12/23/17 REFERRING MD:  Hospitalist CHIEF COMPLAINT:  Anemia and low blood pressure   Brief History   Asked to see this patient for acute blood loss anemia and low blood pressure. Status post 10 teeth extraction approximately one week ago. On coumadin for a mechanical aortic valve placed approximately 25 years ago. Hgb on admission < 5gms. INR elevated at 4.  Patient admitted to step down for transfusions. Goal is to restore sufficient circulatory volume to resolve hypotension and to monitor for possible CHF. Significant Hospital Events     Consults: date of consult/date signed off & final recs:   Procedures (surgical and bedside):   Significant Diagnostic Tests:  12/23/17 cbc 12/23/17 chest xray  Micro Data:   Antimicrobials:    Subjective:  Patient is awake and alert. Denies dyspnea, palpitations or angina. Objective   Blood pressure (!) 96/46, pulse 75, temperature 98.3 F (36.8 C), temperature source Oral, resp. rate 18, height 6\' 2"  (1.88 m), weight 100.8 kg, SpO2 98 %.       No intake or output data in the 24 hours ending 12/23/17 1834 Filed Weights   12/23/17 1249  Weight: 100.8 kg    Examination: General: awake, alert and oriented HENT: no jvd or icterus. Multiple teeth removed. Packing noted in the left upper molar region. No active bleeding. No epistaxis. Lungs: clear to auscultation. No crackles. Cardiovascular: s1s2, regular rhythm. Pulse approximately 85. Prosthetic aortic valve click. No gallop or rub. Abdomen: soft, non tender. No organomegaly. Extremities: warm to touch. Chronic venous stasis changes of the legs with mild pedal edema. No cyanosis. +2 pulses. Punctate ecchymoses. Neuro: awake, alert and oriented. No gross motor deficits, no cn deficits. GU:   Resolved Hospital Problem list    Assessment & Plan:  Acute blood loss anemia with  hypotension -transfuse to a hgb > 7 gms and a MAP of > 79mmHg -monitor for s/s of pulmonary edema -contact the ICU physician on call if vasopressor therapy required or dyspnea develops suggesting pulmonary edema.  Moderate hyponatremia; appears asymptomatic -likely due to chronic diuretic therapy - volume restriction mainstay of treatment. Correction should not exceed approximately 6 mMol/liter/24 hours to avoid the development of ODS. -monitor the bmp q4-6h for now.  Disposition / Summary of Today's Plan 12/23/17   See above    Diet: per the hospitalist Pain/Anxiety/Delirium protocol (if indicated): n/a VAP protocol (if indicated)n/a DVT prophylaxis: hold due to coagulopathy. Consider scd's GI prophylaxis: n/a Hyperglycemia protocol:  Mobility Code Status:  Family Communication: no one at the bedside.  Labs   CBC: Recent Labs  Lab 12/20/17 1428 12/23/17 1259  WBC 6.9 9.4  NEUTROABS  --  6.6  HGB 7.1* 4.9*  HCT 23.3* 16.3*  MCV 105.9* 108.7*  PLT 179 650   Basic Metabolic Panel: Recent Labs  Lab 12/20/17 1428 12/23/17 1259  NA 134* 126*  K 4.4 5.2*  CL 90* 88*  CO2 29 23  GLUCOSE 118* 123*  BUN 37* 42*  CREATININE 1.92* 2.39*  CALCIUM 9.4 8.4*   GFR: Estimated Creatinine Clearance: 31.2 mL/min (A) (by C-G formula based on SCr of 2.39 mg/dL (H)). Recent Labs  Lab 12/20/17 1428 12/23/17 1259  WBC 6.9 9.4   Liver Function Tests: Recent Labs  Lab 12/23/17 1259  AST 24  ALT 8  ALKPHOS 77  BILITOT 1.2  PROT 5.8*  ALBUMIN 2.8*  No results for input(s): LIPASE, AMYLASE in the last 168 hours. No results for input(s): AMMONIA in the last 168 hours. ABG    Component Value Date/Time   PHART 7.321 (L) 12/30/2008 1303   PCO2ART 48.5 (H) 12/30/2008 1303   PO2ART 296.0 (H) 12/30/2008 1303   HCO3 24.3 (H) 12/30/2008 1303   TCO2 25.8 12/30/2008 1303   ACIDBASEDEF 1.0 12/30/2008 1303   O2SAT 99.8 12/30/2008 1303    Coagulation Profile: Recent Labs    Lab 12/17/17 12/20/17 1428 12/23/17 1259  INR 2.9 2.81 4.32*   Cardiac Enzymes: No results for input(s): CKTOTAL, CKMB, CKMBINDEX, TROPONINI in the last 168 hours. HbA1C: Hgb A1c MFr Bld  Date/Time Value Ref Range Status  12/07/2017 10:37 AM 4.7 (L) 4.8 - 5.6 % Final    Comment:    (NOTE) Pre diabetes:          5.7%-6.4% Diabetes:              >6.4% Glycemic control for   <7.0% adults with diabetes   10/15/2017 09:17 AM 5.1 4.6 - 6.5 % Final    Comment:    Glycemic Control Guidelines for People with Diabetes:Non Diabetic:  <6%Goal of Therapy: <7%Additional Action Suggested:  >8%    CBG: No results for input(s): GLUCAP in the last 168 hours.  Admitting History of Present Illness.   See above Review of Systems:   GI: negative for nausea, hematemesis or hematochezia HENT: negative for epistaxis Skin: + for easy brusing. The rest as per the HPI Past medical history  He,  has a past medical history of Carotid artery disease ([place]) (1994), Chronic atrial fibrillation ([place]), Chronic diastolic CHF (congestive heart failure) ([place]), Diabetes mellitus without complication ([place]), Dyspnea, Heart murmur, CABG, Hypertension, OSA (obstructive sleep apnea), Rheumatic fever, S/P AVR (aortic valve replacement), Subclavian bypass stenosis (H[facility]), Temporary low platelet count ([place]), and Vitamin B12 deficiency.   Surgical History    Past Surgical History:  Procedure Laterality Date  . [place]   replaced due to aortic stenosis, [employer] mechanical prostesis  . CAROTID ENDARTERECTOMY Left 1997   subclavian bypass Done in [place]  . CORONARY ARTERY BYPASS GRAFT  1994   w SVG to RCA and SVG to circumflex  . heart bypass     Done in [place]  . MULTIPLE EXTRACTIONS WITH ALVEOLOPLASTY Bilateral 12/12/2017   Procedure: Extraction of tooth #'s 1, 12,13,14,15,17,18,19, 29, and 30 with alveoloplasty and gross debridement of remaining teeth`;  Surgeon: Lenn Cal, DDS;   Location: [facility];  Service: Oral Surgery;  Laterality: Bilateral;  MULTIPLE EXTRACTION WITH ALVEOLOPLASTY WITH PRE PROSTHETIC SURGERY AND GROSS DEBRIDEMENT OF REMAINING TEETH  . TEE WITHOUT CARDIOVERSION N/A 10/23/2017   Procedure: TRANSESOPHAGEAL ECHOCARDIOGRAM (TEE);  Surgeon: Larey Dresser, MD;  Location: [facility] ENDOSCOPY;  Service: Cardiovascular;  Laterality: N/A;  . TONSILLECTOMY       Social History   Social History   Socioeconomic History  . Marital status: Married    Spouse name: Not on file  . Number of children: 3  . Years of education: Not on file  . Highest education level: Not on file  Occupational History  . Not on file  Social Needs  . Financial resource strain: Not on file  . Food insecurity:    Worry: Not on file    Inability: Not on file  . Transportation needs:    Medical: Not on file    Non-medical: Not on file  Tobacco Use  . Smoking status: Former Smoker    Packs/day: 1.00    Years: 30.00    Pack years: 30.00    Types: Cigarettes    Last attempt to quit: 04/03/1992    Years since quitting: 25.7  . Smokeless tobacco: Never Used  Substance and Sexual Activity  . Alcohol use: Yes    Comment: 4-6 ounces of wine daily  . Drug use: No    Comment: Half a cup a day.  Marland Kitchen Sexual activity: Not on file  Lifestyle  . Physical activity:    Days per week: Not on file    Minutes per session: Not on file  . Stress: Not on file  Relationships  . Social connections:    Talks on phone: Not on file    Gets together: Not on file    Attends religious service: Not on file    Active member of club or organization: Not on file    Attends meetings of clubs or organizations: Not on file    Relationship status: Not on file  . Intimate partner violence:    Fear of current or ex partner: Not on file    Emotionally abused: Not on file    Physically abused: Not on file    Forced sexual activity: Not on file  Other Topics Concern  . Not on file  Social History Narrative    Patient is married and lives with his wife.   Youngest daughter lives in [place] also.   Patient has 2 other children.  ,  reports that he quit smoking about 25 years ago. His smoking use included cigarettes. He has a 30.00 pack-year smoking history. He has never used smokeless tobacco. He reports that he drinks alcohol. He reports that he does not use drugs.   Family history   His family history includes Cancer in his father; Hypertension in his mother.   Allergies Allergies  Allergen Reactions  . Rifampin Rash    May have been caused by Vancomycin or Rifampin (??)  . Vancomycin Rash    May have been caused by Vancomycin or Rifampin (??)    Home meds  Prior to Admission medications   Medication Sig Start Date End Date Taking? Authorizing Provider  allopurinol (ZYLOPRIM) 100 MG tablet Take 1 tablet (100 mg total) by mouth daily. 12/05/17  Yes Georgiana Shore, NP  amiodarone (PACERONE) 200 MG tablet Take 1 tablet (200 mg total) by mouth 2 (two) times daily. 12/14/17  Yes Shirley Friar, PA-C  ezetimibe-simvastatin (VYTORIN) 10-20 MG tablet Take 1 tablet by mouth at bedtime. 04/05/17  Yes [name], [name], MD  torsemide (DEMADEX) 100 MG tablet Take 1 tablet (100 mg total) by mouth 2 (two) times daily. 12/05/17  Yes Georgiana Shore, NP       CRITICAL CARE Performed by: Jesus Genera   Total critical care time: 30 minutes  Critical care time was exclusive of separately billable procedures and treating other patients.  Critical care was necessary to treat or prevent imminent or life-threatening deterioration.  Critical care was time spent personally by me on the following activities: development of treatment plan with patient and/or surrogate as well as nursing, discussions with consultants, evaluation of patient's response to treatment, examination of patient, obtaining history from patient or surrogate, ordering and performing treatments and interventions, ordering and  review of laboratory studies, ordering and review of radiographic studies, pulse oximetry and re-evaluation of patient's condition.

## 2017-12-23 NOTE — Progress Notes (Signed)
[name] [id number] Admitting Diagnosis: Hyperkalemia [E87.5] Elevated INR [R79.1] AKI (acute kidney injury) ([place]) [N17.9] Symptomatic anemia [D64.9] DOB:  May 04, 1935 Admission Data: 12/23/2017 7:31 PM Attending Provider: Deatra James, MD  HBZ:JIRCVELFY, Andrew Lacks, MD Consults/ Treatment Team:   [name] is a 82 y.o. male patient admitted from ED awake, alert & oriented  X3,  Full Code, VSS - Blood pressure (!) 97/43, pulse 65, temperature 99.2 F (37.3 C), temperature source Oral, resp. rate 17, height 6\' 2"  (1.88 m), weight 100.8 kg, SpO2 97 %., O2  3 L nasal cannula no c/o shortness of breath, no c/o chest pain, no distress noted. Tele # [id number] placed and pt is currently running EKG: "normal EKG, normal sinus rhythm","unchanged from previous tracings}.  IV site located to right hand WDL with a transparent dressing that's clean, dry, and intact.  Diet Order            Diet Carb Modified Fluid consistency: Thin; Room service appropriate? Yes  Diet effective now               Allergies:   Allergies  Allergen Reactions  . Rifampin Rash    May have been caused by Vancomycin or Rifampin (??)  . Vancomycin Rash    May have been caused by Vancomycin or Rifampin (??)     Past Medical History:  Diagnosis Date  . Carotid artery disease ([place]) 1994   s/p left carotid endarerectomy   . Chronic atrial fibrillation (HCC)    a. on coumadin   . Chronic diastolic CHF (congestive heart failure) ([place])   . Diabetes mellitus without complication ([place])    dx 2016  . Dyspnea   . Heart murmur   . Hx of CABG    a. 1994  . Hypertension   . OSA (obstructive sleep apnea)   . Rheumatic fever   . S/P AVR (aortic valve replacement)    a. mechanical valve 1996  . Subclavian bypass stenosis ([place])   . 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 Vitamin B12 deficiency     History:  obtained from patient. Tobacco/alcohol:   Social History   Tobacco Use  Smoking Status Former Smoker  . Packs/day: 1.00  . Years: 30.00  . Pack years: 30.00  . Types: Cigarettes  . Last attempt to quit: 04/03/1992  . Years since quitting: 25.7  Smokeless Tobacco Never Used     Social History   Substance and Sexual Activity  Alcohol Use Yes   Comment: 4-6 ounces of wine daily     Patient oriented to unit, room and routine. Admission INP armband ID verified with patient, and in place. SR up x 2, fall risk assessment complete with patient and family verbalizing understanding of risks associated with falls. Patient verbalizes an understanding of how to use the call bell and to call for help before getting out of bed.  Skin, clean-dry- intact without evidence of bruising, or skin tears.   No evidence of skin break down noted on exam.    12/23/17 1700  Integumentary  Integumentary (WDL) X  RN Assisting with Skin Assessment on Admission Andrew Ashing RN  Skin Color Pale  Skin Condition Dry  Skin Integrity Ecchymosis  Abrasion Location Arm  Abrasion Location Orientation Right;Anterior  Ecchymosis Location Arm;Face  Ecchymosis Location Orientation Bilateral  Skin Turgor Non-tenting      Will cont to monitor and assist as needed.  [name]  Andrew Arnall, RN 12/23/2017 7:31 PM

## 2017-12-23 NOTE — ED Notes (Signed)
Pt given 250cc en route by EMS

## 2017-12-23 NOTE — ED Notes (Signed)
2nd IV access attempted without success.

## 2017-12-24 ENCOUNTER — Encounter (HOSPITAL_COMMUNITY): Payer: Self-pay

## 2017-12-24 LAB — BASIC METABOLIC PANEL
ANION GAP: 15 (ref 5–15)
BUN: 40 mg/dL — ABNORMAL HIGH (ref 8–23)
CALCIUM: 8.6 mg/dL — AB (ref 8.9–10.3)
CO2: 23 mmol/L (ref 22–32)
Chloride: 90 mmol/L — ABNORMAL LOW (ref 98–111)
Creatinine, Ser: 2.17 mg/dL — ABNORMAL HIGH (ref 0.61–1.24)
GFR calc Af Amer: 31 mL/min — ABNORMAL LOW (ref >60)
GFR calc non Af Amer: 27 mL/min — ABNORMAL LOW (ref >60)
GLUCOSE: 113 mg/dL — AB (ref 70–99)
Potassium: 3.7 mmol/L (ref 3.5–5.1)
Sodium: 128 mmol/L — ABNORMAL LOW (ref 135–145)

## 2017-12-24 LAB — URINALYSIS, ROUTINE W REFLEX MICROSCOPIC
BILIRUBIN URINE: NEGATIVE
Glucose, UA: NEGATIVE mg/dL
HGB URINE DIPSTICK: NEGATIVE
Ketones, ur: NEGATIVE mg/dL
Nitrite: NEGATIVE
PROTEIN: NEGATIVE mg/dL
SPECIFIC GRAVITY, URINE: 1.006 (ref 1.005–1.030)
pH: 7 (ref 5.0–8.0)

## 2017-12-24 LAB — CBC
HCT: 23.6 % — ABNORMAL LOW (ref 39.0–52.0)
HEMATOCRIT: 22.7 % — AB (ref 39.0–52.0)
HEMOGLOBIN: 7.9 g/dL — AB (ref 13.0–17.0)
Hemoglobin: 7.4 g/dL — ABNORMAL LOW (ref 13.0–17.0)
MCH: 31.3 pg (ref 26.0–34.0)
MCH: 30.5 pg (ref 26.0–34.0)
MCHC: 33.5 g/dL (ref 30.0–36.0)
MCHC: 32.6 g/dL (ref 30.0–36.0)
MCV: 93.7 fL (ref 78.0–100.0)
MCV: 93.4 fL (ref 78.0–100.0)
PLATELETS: 131 10*3/uL — AB (ref 150–400)
Platelets: 126 10*3/uL — ABNORMAL LOW (ref 150–400)
RBC: 2.52 MIL/uL — ABNORMAL LOW (ref 4.22–5.81)
RBC: 2.43 MIL/uL — AB (ref 4.22–5.81)
RDW: 20.3 % — ABNORMAL HIGH (ref 11.5–15.5)
RDW: 20.4 % — ABNORMAL HIGH (ref 11.5–15.5)
WBC: 5.6 10*3/uL (ref 4.0–10.5)
WBC: 4.9 10*3/uL (ref 4.0–10.5)

## 2017-12-24 LAB — GLUCOSE, CAPILLARY
GLUCOSE-CAPILLARY: 111 mg/dL — AB (ref 70–99)
Glucose-Capillary: 129 mg/dL — ABNORMAL HIGH (ref 70–99)
Glucose-Capillary: 120 mg/dL — ABNORMAL HIGH (ref 70–99)
Glucose-Capillary: 103 mg/dL — ABNORMAL HIGH (ref 70–99)

## 2017-12-24 LAB — HEPARIN LEVEL (UNFRACTIONATED): Heparin Unfractionated: 0.37 IU/mL (ref 0.30–0.70)

## 2017-12-24 LAB — APTT: aPTT: 38 seconds — ABNORMAL HIGH (ref 24–36)

## 2017-12-24 LAB — TROPONIN I
TROPONIN I: 0.03 ng/mL — AB (ref <0.03)
Troponin I: 0.03 ng/mL (ref <0.03)
Troponin I: <0.03 ng/mL (ref <0.03)

## 2017-12-24 LAB — PROTIME-INR
INR: 2.16
PROTHROMBIN TIME: 23.9 s — AB (ref 11.4–15.2)

## 2017-12-24 MED ORDER — HEPARIN (PORCINE) IN NACL 100-0.45 UNIT/ML-% IJ SOLN
1100.0000 [IU]/h | INTRAMUSCULAR | Status: DC
  Administered 2017-12-24: 1200 [IU]/h via INTRAVENOUS
  Administered 2017-12-25: 1100 [IU]/h via INTRAVENOUS
  Filled 2017-12-24 (×2): qty 250

## 2017-12-24 MED ORDER — GERHARDT'S BUTT CREAM
TOPICAL_CREAM | Freq: Two times a day (BID) | CUTANEOUS | Status: DC
  Administered 2017-12-24 – 2017-12-28 (×9): via TOPICAL
  Filled 2017-12-24: qty 1

## 2017-12-24 MED ORDER — SODIUM CHLORIDE 0.9 % IV SOLN: INTRAVENOUS | Status: DC

## 2017-12-24 MED ORDER — LEVALBUTEROL HCL 0.63 MG/3ML IN NEBU: 0.6300 mg | INHALATION_SOLUTION | RESPIRATORY_TRACT | Status: DC | PRN

## 2017-12-24 MED ORDER — TORSEMIDE 20 MG PO TABS
50.0000 mg | ORAL_TABLET | Freq: Two times a day (BID) | ORAL | Status: DC
Start: 2017-12-25 — End: 2017-12-27
  Administered 2017-12-25 – 2017-12-27 (×4): 50 mg via ORAL
  Filled 2017-12-24 (×4): qty 3

## 2017-12-24 NOTE — Evaluation (Signed)
Physical Therapy Evaluation Patient Details Name: [name] MRN: [id number] DOB: Aug 19, 1935 Today's Date: 12/24/2017   History of Present Illness  82yo male presenting to the ED with generalized weakness, HA, feelings of passing out. He had a tooth extracted 1-2 days ago and his bleeding has not stopped. Diagnosed with severe anemia, supratherapetic INR, and hypotension. PMH mechanical aoritic valve placement, A-fib on coumadin, CHF, CKD, DM, COPD, mitral regurg, HTN   Clinical Impression   Patient received sitting at EOB with nursing staff present, pleasant and willing to participate in PT session. He is able to complete functional transfers and gait with RW in room with S, no physical assist given, VC for management of lines and leads. He was left up in the chair with all needs met, alarm activated, and nursing staff present. At this point he will likely benefit from ongoing skilled HHPT services, as well as ongoing skilled PT services in the acute setting. VSS stable throughout session with no significant BP change with positional changes, SpO2 stable on 2LPM O2 during activity.     Follow Up Recommendations Home health PT    Equipment Recommendations  None recommended by PT    Recommendations for Other Services       Precautions / Restrictions Precautions Precautions: Fall Restrictions Weight Bearing Restrictions: No      Mobility  Bed Mobility               General bed mobility comments: DNT, received sitting at EOB with nursing   Transfers Overall transfer level: Needs assistance Equipment used: Rolling walker (2 wheeled) Transfers: Sit to/from Stand Sit to Stand: Supervision         General transfer comment: S for safety, no physical assist given   Ambulation/Gait Ambulation/Gait assistance: Supervision Gait Distance (Feet): 6 Feet Assistive device: Rolling walker (2 wheeled) Gait Pattern/deviations: Step-through pattern;Decreased step length -  right;Decreased step length - left;Decreased stride length     General Gait Details: on 2LPM O2, S for safety no physical assist given   Stairs            Wheelchair Mobility    Modified Rankin (Stroke Patients Only)       Balance Overall balance assessment: Needs assistance Sitting-balance support: No upper extremity supported;Feet supported Sitting balance-Leahy Scale: Good     Standing balance support: During functional activity;Bilateral upper extremity supported Standing balance-Leahy Scale: Fair Standing balance comment: UE support                             Pertinent Vitals/Pain Pain Assessment: No/denies pain Faces Pain Scale: No hurt Pain Intervention(s): Limited activity within patient's tolerance;Monitored during session    [place] expects to be discharged to:: Private residence Living Arrangements: Spouse/significant other Available Help at Discharge: Family;Available 24 hours/day Type of Home: House Home Access: Stairs to enter Entrance Stairs-Rails: None Entrance Stairs-Number of Steps: 1 Home Layout: Two level;Able to live on main level with bedroom/bathroom Home Equipment: Grab bars - toilet;Walker - 4 wheels;Bedside commode;Shower seat - built Investment banker, operational      Prior Function Level of Independence: Needs assistance   Gait / Transfers Assistance Needed: amb in house without assistive device at times. Uses rollator in the community  ADL's / Homemaking Assistance Needed: wife assists with socks        Hand Dominance        Extremity/Trunk Assessment   Upper Extremity Assessment Upper Extremity Assessment:  Defer to OT evaluation    Lower Extremity Assessment Lower Extremity Assessment: Generalized weakness    Cervical / Trunk Assessment Cervical / Trunk Assessment: Normal  Communication   Communication: No difficulties  Cognition Arousal/Alertness: Awake/alert Behavior During Therapy: WFL  for tasks assessed/performed Overall Cognitive Status: Within Functional Limits for tasks assessed                                        General Comments      Exercises     Assessment/Plan    PT Assessment Patient needs continued PT services  PT Problem List Decreased strength;Decreased mobility;Decreased safety awareness;Decreased coordination;Decreased activity tolerance;Decreased balance;Decreased knowledge of use of DME       PT Treatment Interventions DME instruction;Therapeutic activities;Gait training;Therapeutic exercise;Patient/family education;Stair training;Balance training;Functional mobility training;Neuromuscular re-education    PT Goals (Current goals can be found in the Care Plan section)  Acute Rehab PT Goals Patient Stated Goal: to go home, get stronger  PT Goal Formulation: With patient Time For Goal Achievement: 01/07/18 Potential to Achieve Goals: Good    Frequency Min 3X/week   Barriers to discharge        Co-evaluation               AM-PAC PT "6 Clicks" Daily Activity  Outcome Measure Difficulty turning over in bed (including adjusting bedclothes, sheets and blankets)?: A Little Difficulty moving from lying on back to sitting on the side of the bed? : A Little Difficulty sitting down on and standing up from a chair with arms (e.g., wheelchair, bedside commode, etc,.)?: A Little Help needed moving to and from a bed to chair (including a wheelchair)?: A Little Help needed walking in hospital room?: A Little Help needed climbing 3-5 steps with a railing? : A Little 6 Click Score: 18    End of Session Equipment Utilized During Treatment: Oxygen Activity Tolerance: Patient tolerated treatment well Patient left: in chair;with chair alarm set;with call bell/phone within reach;with nursing/sitter in room Nurse Communication: Mobility status PT Visit Diagnosis: Muscle weakness (generalized) (M62.81);History of falling (Z91.81)     Time: 4081-4481 PT Time Calculation (min) (ACUTE ONLY): 23 min   Charges:   PT Evaluation $PT Eval Moderate Complexity: 1 Mod PT Treatments $Therapeutic Activity: 8-22 mins        Deniece Ree PT, DPT, CBIS  Supplemental Physical Therapist [place]    Pager [phone] Acute Rehab Office [phone]

## 2017-12-24 NOTE — Consult Note (Signed)
   [facility] CM Inpatient Consult   12/24/2017  [name] April 27, 1935 [id number]    Patient screened for potential [facility] Care Management services due to unplanned readmission score of 27% (high).  Went to bedside to speak with [name]. However, he was working with therapy.   Will follow up at later time regarding [place] Management program.  Made inpatient [facility] aware of attempted visit.   Marthenia Rolling, MSN-Ed, RN,BSN [facility] Liaison [phone]

## 2017-12-24 NOTE — Progress Notes (Signed)
0.45 % sodium chloride paused while blood is transfusing.

## 2017-12-24 NOTE — Progress Notes (Signed)
ANTICOAGULATION CONSULT NOTE - Initial Consult  Pharmacy Consult for heparin Indication: atrial fibrillation and mechanical valve  Allergies  Allergen Reactions  . Rifampin Rash    May have been caused by Vancomycin or Rifampin (??)  . Vancomycin Rash    May have been caused by Vancomycin or Rifampin (??)    Patient Measurements: Height: 6\' 2"  (188 cm) Weight: 227 lb 11.8 oz (103.3 kg) IBW/kg (Calculated) : 82.2  Heparin dosing weight 100.8 kg  Vital Signs: Temp: 97.7 F (36.5 C) (09/23 0900) Temp Source: Oral (09/23 0900) BP: 87/58 (09/23 0800) Pulse Rate: 70 (09/23 0029)  Labs: Recent Labs    12/23/17 1259 12/24/17 0904  HGB 4.9* 7.9*  HCT 16.3* 23.6*  PLT 221 126*  APTT  --  38*  LABPROT 41.1* 23.9*  INR 4.32* 2.16  CREATININE 2.39* 2.17*  TROPONINI  --  0.03*    Estimated Creatinine Clearance: 34.8 mL/min (A) (by C-G formula based on SCr of 2.17 mg/dL (H)).   Medical History: Past Medical History:  Diagnosis Date  . Carotid artery disease ([place]) 1994   s/p left carotid endarerectomy   . Chronic atrial fibrillation (HCC)    a. on coumadin   . Chronic diastolic CHF (congestive heart failure) ([place])   . Diabetes mellitus without complication ([place])    dx 2016  . Dyspnea   . Heart murmur   . Hx of CABG    a. 1994  . Hypertension   . OSA (obstructive sleep apnea)   . Rheumatic fever   . S/P AVR (aortic valve replacement)    a. mechanical valve 1996  . Subclavian bypass stenosis ([place])   . 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 Vitamin B12 deficiency     Assessment: 63 yom presented with weakness and bleeding from his mouth. Pt is on chronic warfarin for history of afib and mechanical AVR. INR is subtherapeutic at 2.16. Already given 2.5mg  PO vitamin K and tranexamic acid topical has been ordered.   MD wishes to start heparin drip to target lower end of therapeutic range due to bleeding.     Goal of Therapy:  Heparin level 0.3-0.5 Monitor platelets by anticoagulation protocol: Yes   Plan:  Heparin 1200 units/hr with no bolus Heparin level in 8 hours Daily Heparin level F/u bleeding  Creta Dorame A. Levada Dy, PharmD, [place] Pager: [phone] Please utilize Amion for appropriate phone number to reach the unit pharmacist ([place])   12/24/2017,11:09 AM

## 2017-12-24 NOTE — Progress Notes (Signed)
[place] TEAM 1 - Stepdown/ICU TEAM  [name]  QHU:[id number] DOB: 1936-01-12 DOA: 12/23/2017 PCP: Cassandria Anger, MD    Brief Narrative:  82 y.o. M w/ a hx of mechanical aortic valve (1994), OSA on CPAP, permanent A. fib, chronic anticoagulant therapy with Coumadin, chronic combined CHF, CKD stage III, DM, CAD with CABG 1994, COPD, severe mitral regurgitation, carotid stenosis with left carotid enterotomy, HTN, and HLD who presented to ED with generalized weakness and headache.  He reported a tooth extraction on 9/11 after which his bleeding had not stopped. He continued to take his warfarin.    In the ED he was found to have a BP of 88/51, a Hgb of 4.9, and an INR of 4.32.  Significant Events: 9/22 admit   Subjective: Reports that he has not noted any further bleeding since yesterday afternoon.  Denies cp, sob, n/v, or abdom pain.    Assessment & Plan:  Acute blood loss anemia - bleeding after dental surgery  Now s/p 4U PRBC this admit - Hgb trend to be followed in serial fashion  Recent Labs  Lab 12/20/17 1428 12/23/17 1259 12/24/17 0904  HGB 7.1* 4.9* 7.9*    Active bleeding site from tooth extraction Appears on exam to have ceased at this time - hold off on coumadin but cover w/ IV heparin for now   Supratherapeutic INR chronically on Coumadin due to mechanical aortic valve + A. Fib - INR corrected to below therapeutic range - initiate IV heparin for now and follow for bleeding   Hypotensive Patient appears to have a chronically low BP of late, w/ SBP in the 81-95 range during an admit August 2019 - avoid over expansion of volume - MAP currently >60 and pt is fully asymptomatic   Acute renal insufficiency Baseline crt appears to be ~1.5 - follow w/ restoration of volume/transfusion   Recent Labs  Lab 12/20/17 1428 12/23/17 1259 12/24/17 0904  CREATININE 1.92* 2.39* 2.17*    Hyponatremia Secondary to volume depletion/blood loss +/- a chronic  component in setting of CHF - follow trend   Hyperkalemia Resolved  HLD  Atrial fibrillation continue amiodarone - rate controlled - now on IV heparin   Chronic diastolic CHF w/ prominent RV failure  Last TTE 10/23/2017, EF 55-60%, sever MR, mechanical AV - followed by CHF Team   DM2 CBG well controlled   Coronary artery disease with history of myocardial infarction s/p CABG Transfuse as needed to keep Hgb at 8.0 or >  DVT prophylaxis: IV heparin  Code Status: FULL CODE Family Communication: no family present at time of exam  Disposition Plan: SDU  Consultants:  none  Antimicrobials:  none   Objective: Blood pressure (!) 89/58, pulse 70, temperature 97.9 F (36.6 C), temperature source Oral, resp. rate 14, height 6\' 2"  (1.88 m), weight 103.3 kg, SpO2 100 %.  Intake/Output Summary (Last 24 hours) at 12/24/2017 1408 Last data filed at 12/24/2017 1300 Gross per 24 hour  Intake 1594 ml  Output 2575 ml  Net -981 ml   Filed Weights   12/23/17 1249 12/24/17 0500  Weight: 100.8 kg 103.3 kg    Examination: General: No acute respiratory distress HEENT: sutures in place in L upper and lower lateral/posterior jaw w/o active hemorrhage  Lungs: Clear to auscultation bilaterally without wheezes or crackles Cardiovascular: irreg irreg - rate controlled - metallic click - no rub  Abdomen: Nontender, nondistended, soft, bowel sounds positive, no rebound, no ascites, no appreciable mass Extremities:  No significant cyanosis, clubbing, or edema bilateral lower extremities  CBC: Recent Labs  Lab 12/20/17 1428 12/23/17 1259 12/24/17 0904  WBC 6.9 9.4 5.6  NEUTROABS  --  6.6  --   HGB 7.1* 4.9* 7.9*  HCT 23.3* 16.3* 23.6*  MCV 105.9* 108.7* 93.7  PLT 179 221 389*   Basic Metabolic Panel: Recent Labs  Lab 12/20/17 1428 12/23/17 1259 12/24/17 0904  NA 134* 126* 128*  K 4.4 5.2* 3.7  CL 90* 88* 90*  CO2 29 23 23   GLUCOSE 118* 123* 113*  BUN 37* 42* 40*  CREATININE  1.92* 2.39* 2.17*  CALCIUM 9.4 8.4* 8.6*   GFR: Estimated Creatinine Clearance: 34.8 mL/min (A) (by C-G formula based on SCr of 2.17 mg/dL (H)).  Liver Function Tests: Recent Labs  Lab 12/23/17 1259  AST 24  ALT 8  ALKPHOS 77  BILITOT 1.2  PROT 5.8*  ALBUMIN 2.8*    Coagulation Profile: Recent Labs  Lab 12/20/17 1428 12/23/17 1259 12/24/17 0904  INR 2.81 4.32* 2.16    Cardiac Enzymes: Recent Labs  Lab 12/24/17 0904  TROPONINI 0.03*    HbA1C: Hgb A1c MFr Bld  Date/Time Value Ref Range Status  12/07/2017 10:37 AM 4.7 (L) 4.8 - 5.6 % Final    Comment:    (NOTE) Pre diabetes:          5.7%-6.4% Diabetes:              >6.4% Glycemic control for   <7.0% adults with diabetes   10/15/2017 09:17 AM 5.1 4.6 - 6.5 % Final    Comment:    Glycemic Control Guidelines for People with Diabetes:Non Diabetic:  <6%Goal of Therapy: <7%Additional Action Suggested:  >8%     CBG: Recent Labs  Lab 12/23/17 2216 12/24/17 0757 12/24/17 1149  GLUCAP 116* 111* 129*    Recent Results (from the past 240 hour(s))  MRSA PCR Screening     Status: None   Collection Time: 12/23/17  6:49 PM  Result Value Ref Range Status   MRSA by PCR NEGATIVE NEGATIVE Final    Comment:        The GeneXpert MRSA Assay (FDA approved for NASAL specimens only), is one component of a comprehensive MRSA colonization surveillance program. It is not intended to diagnose MRSA infection nor to guide or monitor treatment for MRSA infections. Performed at [facility], [place] [address] [place], [place] [address]      Scheduled Meds: . sodium chloride   Intravenous Once  . sodium chloride   Intravenous Once  . acetaminophen  650 mg Oral Once  . allopurinol  100 mg Oral Daily  . amiodarone  200 mg Oral BID  . docusate sodium  100 mg Oral BID  . ezetimibe-simvastatin  1 tablet Oral QHS  . furosemide  20 mg Intravenous Once  . furosemide  20 mg Intravenous Once  . Gerhardt's butt cream    Topical BID  . insulin aspart  0-9 Units Subcutaneous TID WC  . torsemide  50 mg Oral BID  . tranexamic acid  500 mg Topical Once   Continuous Infusions: . sodium chloride Stopped (12/23/17 2100)  . heparin 1,200 Units/hr (12/24/17 1254)     LOS: 1 day   Cherene Altes, MD [facility] Office  [phone] Pager - Text Page per Amion  If 7PM-7AM, please contact night-coverage per Amion 12/24/2017, 2:08 PM

## 2017-12-24 NOTE — Progress Notes (Signed)
ANTICOAGULATION CONSULT NOTE - Follow-Up Consult  Pharmacy Consult for heparin Indication: atrial fibrillation and mechanical valve  Allergies  Allergen Reactions  . Rifampin Rash    May have been caused by Vancomycin or Rifampin (??)  . Vancomycin Rash    May have been caused by Vancomycin or Rifampin (??)    Patient Measurements: Height: 6\' 2"  (188 cm) Weight: 227 lb 11.8 oz (103.3 kg) IBW/kg (Calculated) : 82.2  Heparin dosing weight 100.8 kg  Vital Signs: Temp: 97.9 F (36.6 C) (09/23 1200) Temp Source: Oral (09/23 1200) BP: 89/58 (09/23 1200)  Labs: Recent Labs    12/23/17 1259 12/24/17 0904 12/24/17 1809  HGB 4.9* 7.9* 7.4*  HCT 16.3* 23.6* 22.7*  PLT 221 126* 131*  APTT  --  38*  --   LABPROT 41.1* 23.9*  --   INR 4.32* 2.16  --   HEPARINUNFRC  --   --  0.37  CREATININE 2.39* 2.17*  --   TROPONINI  --  0.03* <0.03    Estimated Creatinine Clearance: 34.8 mL/min (A) (by C-G formula based on SCr of 2.17 mg/dL (H)).   Medical History: Past Medical History:  Diagnosis Date  . Carotid artery disease ([place]) 1994   s/p left carotid endarerectomy   . Chronic atrial fibrillation (HCC)    a. on coumadin   . Chronic diastolic CHF (congestive heart failure) ([place])   . Diabetes mellitus without complication ([place])    dx 2016  . Dyspnea   . Heart murmur   . Hx of CABG    a. 1994  . Hypertension   . OSA (obstructive sleep apnea)   . Rheumatic fever   . S/P AVR (aortic valve replacement)    a. mechanical valve 1996  . Subclavian bypass stenosis ([place])   . 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 Vitamin B12 deficiency     Assessment: 82 yo M presented with weakness and bleeding from his mouth. Pt is on chronic warfarin for history of afib and mechanical AVR. INR was 4.32 on admission, now subtherapeutic at 2.16. Already given 2.5mg  PO vitamin K and tranexamic acid topical has been ordered.   MD wishes  to start heparin drip to target lower end of therapeutic range due to bleeding.  Initial heparin level is therapeutic on 1200 units/hr.  Goal of Therapy:  Heparin level 0.3-0.5 Monitor platelets by anticoagulation protocol: Yes   Plan:  Continue Heparin 1200 units/hr Confirmation heparin level with AM labs Daily heparin level and CBC F/u bleeding  [employer], Pharm.D., BCPS Clinical Pharmacist Pager: [phone] Clinical phone for 12/24/2017 is [phone].  **Pharmacist phone directory can now be found on [email or web address] ([email or web address]).  Listed under [place].  12/24/2017 8:25 PM

## 2017-12-24 NOTE — Progress Notes (Addendum)
Pt has 4 units of blood ordered, Lasix 20mg  to be given between units. Systolic BP in 08Y and 22V, no signs of fluid overload. Lung sounds clear in upper lobes and clear diminished in lower lobes, denied SOB, no signs of acute distress, no new edema, pt VS WDL exept for BP.  Spoken to NP on call this am. Will hold Lasix until all transfusions are completed. Will continue to monitor pt closely.

## 2017-12-24 NOTE — Evaluation (Signed)
Clinical/Bedside Swallow Evaluation Patient Details  Name: [name] MRN: [id number] Date of Birth: 04/28/1935  Today's Date: 12/24/2017 Time: SLP Start Time (ACUTE ONLY): 4034 SLP Stop Time (ACUTE ONLY): 1603 SLP Time Calculation (min) (ACUTE ONLY): 10 min  Past Medical History:  Past Medical History:  Diagnosis Date  . Carotid artery disease ([place]) 1994   s/p left carotid endarerectomy   . Chronic atrial fibrillation (HCC)    a. on coumadin   . Chronic diastolic CHF (congestive heart failure) ([place])   . Diabetes mellitus without complication ([place])    dx 2016  . Dyspnea   . Heart murmur   . Hx of CABG    a. 1994  . Hypertension   . OSA (obstructive sleep apnea)   . Rheumatic fever   . S/P AVR (aortic valve replacement)    a. mechanical valve 1996  . Subclavian bypass stenosis ([place])   . 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 Vitamin B12 deficiency    Past Surgical History:  Past Surgical History:  Procedure Laterality Date  . [place]   replaced due to aortic stenosis, [employer] mechanical prostesis  . CAROTID ENDARTERECTOMY Left 1997   subclavian bypass Done in [place]  . CORONARY ARTERY BYPASS GRAFT  1994   w SVG to RCA and SVG to circumflex  . heart bypass     Done in [place]  . MULTIPLE EXTRACTIONS WITH ALVEOLOPLASTY Bilateral 12/12/2017   Procedure: Extraction of tooth #'s 1, 12,13,14,15,17,18,19, 29, and 30 with alveoloplasty and gross debridement of remaining teeth`;  Surgeon: Lenn Cal, DDS;  Location: [facility];  Service: Oral Surgery;  Laterality: Bilateral;  MULTIPLE EXTRACTION WITH ALVEOLOPLASTY WITH PRE PROSTHETIC SURGERY AND GROSS DEBRIDEMENT OF REMAINING TEETH  . TEE WITHOUT CARDIOVERSION N/A 10/23/2017   Procedure: TRANSESOPHAGEAL ECHOCARDIOGRAM (TEE);  Surgeon: Larey Dresser, MD;  Location: [facility] ENDOSCOPY;  Service: Cardiovascular;  Laterality: N/A;  . TONSILLECTOMY      HPI:  82yo male presenting to the ED with generalized weakness, HA, feelings of passing out. He had multiple teeth extracted 1-2 days ago and his bleeding has not stopped. Diagnosed with severe anemia, supratherapetic INR, and hypotension. PMH mechanical aoritic valve placement, A-fib on coumadin, CHF, CKD, DM, COPD, mitral regurg, HTN    Assessment / Plan / Recommendation Clinical Impression  Pt presents with normal oropharyngeal swallow - there are no s/s of aspiration; adequate oral control; normal synchrony of swallow/respiratory cycles.  Pt consumed three oz water and pudding without difficulty; self feeds independently. Pt's mouth remains tender after dental extraction, so is avoiding tough/hard foods.  No SLP f/u is needed.  Continue current diet. Our service will sign off.  SLP Visit Diagnosis: Dysphagia, unspecified (R13.10)    Aspiration Risk  No limitations    Diet Recommendation     Medication Administration: Whole meds with liquid    Other  Recommendations Oral Care Recommendations: Oral care BID   Follow up Recommendations        Frequency and Duration            Prognosis        Swallow Study   General Date of Onset: 12/23/17 HPI: 82yo male presenting to the ED with generalized weakness, HA, feelings of passing out. He had a tooth extracted 1-2 days ago and his bleeding has not stopped. Diagnosed with severe anemia, supratherapetic INR, and hypotension. PMH mechanical aoritic valve  placement, A-fib on coumadin, CHF, CKD, DM, COPD, mitral regurg, HTN  Type of Study: Bedside Swallow Evaluation Previous Swallow Assessment: no Diet Prior to this Study: Regular;Thin liquids Temperature Spikes Noted: No History of Recent Intubation: No Behavior/Cognition: Alert;Cooperative;Pleasant mood Oral Cavity Assessment: Within Functional Limits Oral Care Completed by SLP: No Oral Cavity - Dentition: Missing dentition Vision: Functional for self-feeding Self-Feeding  Abilities: Able to feed self Patient Positioning: Upright in bed Baseline Vocal Quality: Normal Volitional Cough: Strong Volitional Swallow: Able to elicit    Oral/Motor/Sensory Function Overall Oral Motor/Sensory Function: Within functional limits   Ice Chips Ice chips: Within functional limits   Thin Liquid Thin Liquid: Within functional limits    Nectar Thick Nectar Thick Liquid: Not tested   Honey Thick Honey Thick Liquid: Not tested   Puree Puree: Within functional limits   Solid     Solid: Not tested(pt declined due to oral discomfort)      [name] 12/24/2017,4:09 PM

## 2017-12-24 NOTE — Consult Note (Signed)
[facility] Nurse wound consult note Reason for Consult: Moisture associated skin damage to coccyx Wound type:MASD Pressure Injury POA: Yes Measurement: Erythema of 2 cmx 3 cm with nonintact center of 0.2 cm slough wound bed Wound VLR:[id number] nonintact is 100% slough Drainage (amount, consistency, odor) minimal serous weeping   Periwound:Noted fecal incontinence.  Currently being bathed Dressing procedure/placement/frequency: Cleanse sacrococcygeal region with soap and water and pat dry.  Apply Gerhardts butt paste twice daily and PRN soilage.  Will not follow at this time.  Please re-consult if needed.  Domenic Moras MSN, RN, FNP-BC CWON Wound, Ostomy, Continence Nurse Pager [phone]

## 2017-12-24 NOTE — Care Management Note (Signed)
Case Management Note  Patient Details  Name: [name] MRN: [id number] Date of Birth: February 02, 1936  Subjective/Objective:  Presents with severe anemia. Recently admitted 12/12/2017 s/p dental extraction for chronic apical periodontitis.Hospital course complicated by postop anemia, V. tach, hypokalemia, volume overload. Discharged on 12/14/2017.  Hx of  mechanical aortic valve 1994, OSA on CPAP, permanent A. Fib/ Coumadin,  CHF, CKD stage III, diabetes mellitus, coronary artery/CABG 1994, COPD, severe mitral regurgitation, carotid stenosis with left carotid enterotomy,HTN, HLD.    From home with wife. PTA active with [facility] for PT/RN services.     Siraj Dermody (Spouse)      [phone]           PCP: Dr. Purnell Shoemaker  Action/Plan: NCM following for transitions of care needs...  Expected Discharge Date:  12/26/17               Expected Discharge Plan:  [place]  In-House Referral:     Discharge planning Services  CM Consult  Post Acute Care Choice:  Resumption of Svcs/PTA Provider, Home Health Choice offered to:  Patient  DME Arranged:    DME Agency:     HH Arranged:  RN, Disease Management, PT [place] Agency:  [facility]  Status of Service:  In process, will continue to follow  If discussed at Long Length of Stay Meetings, dates discussed:    Additional Comments:  Sharin Mons, RN 12/24/2017, 8:03 AM

## 2017-12-25 DIAGNOSIS — N183 Chronic kidney disease, stage 3 (moderate): Secondary | ICD-10-CM

## 2017-12-25 DIAGNOSIS — I5032 Chronic diastolic (congestive) heart failure: Secondary | ICD-10-CM

## 2017-12-25 DIAGNOSIS — D649 Anemia, unspecified: Secondary | ICD-10-CM

## 2017-12-25 DIAGNOSIS — N179 Acute kidney failure, unspecified: Secondary | ICD-10-CM

## 2017-12-25 LAB — BASIC METABOLIC PANEL
Anion gap: 12 (ref 5–15)
BUN: 30 mg/dL — AB (ref 8–23)
CHLORIDE: 90 mmol/L — AB (ref 98–111)
CO2: 29 mmol/L (ref 22–32)
Calcium: 8.2 mg/dL — ABNORMAL LOW (ref 8.9–10.3)
Creatinine, Ser: 1.77 mg/dL — ABNORMAL HIGH (ref 0.61–1.24)
GFR calc non Af Amer: 35 mL/min — ABNORMAL LOW (ref >60)
GFR, EST AFRICAN AMERICAN: 40 mL/min — AB (ref >60)
GLUCOSE: 104 mg/dL — AB (ref 70–99)
Potassium: 2.4 mmol/L — CL (ref 3.5–5.1)
Sodium: 131 mmol/L — ABNORMAL LOW (ref 135–145)

## 2017-12-25 LAB — CBC
HCT: 22.7 % — ABNORMAL LOW (ref 39.0–52.0)
Hemoglobin: 7.4 g/dL — ABNORMAL LOW (ref 13.0–17.0)
MCH: 31 pg (ref 26.0–34.0)
MCHC: 32.6 g/dL (ref 30.0–36.0)
MCV: 95 fL (ref 78.0–100.0)
PLATELETS: 134 10*3/uL — AB (ref 150–400)
RBC: 2.39 MIL/uL — AB (ref 4.22–5.81)
RDW: 20.5 % — ABNORMAL HIGH (ref 11.5–15.5)
WBC: 5 10*3/uL (ref 4.0–10.5)

## 2017-12-25 LAB — GLUCOSE, CAPILLARY
GLUCOSE-CAPILLARY: 104 mg/dL — AB (ref 70–99)
GLUCOSE-CAPILLARY: 111 mg/dL — AB (ref 70–99)
GLUCOSE-CAPILLARY: 112 mg/dL — AB (ref 70–99)
Glucose-Capillary: 104 mg/dL — ABNORMAL HIGH (ref 70–99)

## 2017-12-25 LAB — HEMOGLOBIN AND HEMATOCRIT, BLOOD
HEMATOCRIT: 24.4 % — AB (ref 39.0–52.0)
Hemoglobin: 7.7 g/dL — ABNORMAL LOW (ref 13.0–17.0)

## 2017-12-25 LAB — PROTIME-INR
INR: 1.79
Prothrombin Time: 20.6 seconds — ABNORMAL HIGH (ref 11.4–15.2)

## 2017-12-25 LAB — HEPARIN LEVEL (UNFRACTIONATED)
Heparin Unfractionated: 0.56 IU/mL (ref 0.30–0.70)
Heparin Unfractionated: 0.2 IU/mL — ABNORMAL LOW (ref 0.30–0.70)

## 2017-12-25 LAB — MAGNESIUM: Magnesium: 1.8 mg/dL (ref 1.7–2.4)

## 2017-12-25 MED ORDER — HYDROCODONE-ACETAMINOPHEN 5-325 MG PO TABS
1.0000 | ORAL_TABLET | Freq: Four times a day (QID) | ORAL | Status: DC | PRN
  Administered 2017-12-28 (×2): 1 via ORAL
  Filled 2017-12-25 (×2): qty 1

## 2017-12-25 MED ORDER — POTASSIUM CHLORIDE CRYS ER 20 MEQ PO TBCR
30.0000 meq | EXTENDED_RELEASE_TABLET | ORAL | Status: AC
  Administered 2017-12-25 (×2): 30 meq via ORAL
  Filled 2017-12-25 (×2): qty 1

## 2017-12-25 MED ORDER — POTASSIUM CHLORIDE 10 MEQ/100ML IV SOLN
10.0000 meq | INTRAVENOUS | Status: DC
  Administered 2017-12-25: 10 meq via INTRAVENOUS
  Filled 2017-12-25 (×2): qty 100

## 2017-12-25 MED ORDER — POTASSIUM CHLORIDE 10 MEQ/100ML IV SOLN
10.0000 meq | INTRAVENOUS | Status: AC
  Administered 2017-12-25 (×6): 10 meq via INTRAVENOUS
  Filled 2017-12-25 (×4): qty 100

## 2017-12-25 NOTE — Progress Notes (Signed)
[place] TEAM 1 - Stepdown/ICU TEAM  [name]  VVO:[id number] DOB: 1935-09-27 DOA: 12/23/2017 PCP: Cassandria Anger, MD    Brief Narrative:  82 y.o. M w/ a hx of mechanical aortic valve (1994), OSA on CPAP, permanent A. fib, chronic anticoagulant therapy with Coumadin, chronic combined CHF, CKD stage III, DM, CAD with CABG 1994, COPD, severe mitral regurgitation, carotid stenosis with left carotid enterotomy, HTN, and HLD who presented to ED with generalized weakness and headache.  He reported a tooth extraction on 9/11 after which his bleeding had not stopped. He continued to take his warfarin.  In the ED he was found to have a BP of 88/51, a Hgb of 4.9, and an INR of 4.32.  Significant Events: 9/22 admit   Subjective:   Patient in bed, appears comfortable, denies any headache, no fever, no chest pain or pressure, no shortness of breath , no abdominal pain. No focal weakness.  Assessment & Plan:  Acute blood loss anemia - bleeding after dental surgery, tooth extraction by Dr. Dorothyann Gibbs for dental infection- So far he has received 4U PRBC this admit - Hgb trend for now stable on heparin drip, monitor another 24 hours thereafter resume Coumadin with caution.  Bleeding seems to have resolved.  Recent Labs  Lab 12/20/17 1428 12/23/17 1259 12/24/17 0904 12/24/17 1809 12/25/17 0507  HGB 7.1* 4.9* 7.9* 7.4* 7.4*   Lab Results  Component Value Date   INR 1.79 12/25/2017   INR 2.16 12/24/2017   INR 4.32 (HH) 12/23/2017    Supratherapeutic INR - chronically on Coumadin due to mechanical aortic valve + A. Fib - INR now subtherapeutic, on heparin bridging, if H&H stable tomorrow Coumadin can be resumed with caution and close monitoring.  Chronic hypotension - chronic issue currently asymptomatic monitor.  On moderate to high dose of diuretic at baseline.  Acute renal insufficiency - prerenal due to acute blood loss related anemia, resolved after transfusion.  Hyponatremia - due  to CHF and volume overload, torsemide continue, CHF team has been consulted as well.  Trend improving.  Hypokalemia - aggressively placed, recheck again tomorrow.  HLD -continue home medication no acute issue.  On combination of statin along with ezetimibe.   Chronic atrial fibrillation [place] vas 2 score of at least 4.  Continue combination of amiodarone and anticoagulation as above again cardiology team has been reconsulted.   Chronic diastolic CHF w/ prominent RV failure  -   Last TTE 10/23/2017, EF 55-60%, sever MR, mechanical AV - followed by CHF Team, have been reconsulted, continue oral diuretic for now.  DM2 - CBG well controlled on diet control.  Coronary artery disease with history of myocardial infarction s/p CABG - Transfuse as needed to keep Hgb close to 7.5 - 8 .    DVT prophylaxis: IV heparin  Code Status: FULL CODE Family Communication: no family present at time of exam  Disposition Plan: Tele  Consultants: Cards  Antimicrobials: none   Objective: Blood pressure (!) 85/44, pulse 80, temperature 99 F (37.2 C), temperature source Oral, resp. rate 17, height 6\' 2"  (1.88 m), weight 99.5 kg, SpO2 99 %.  Intake/Output Summary (Last 24 hours) at 12/25/2017 0956 Last data filed at 12/25/2017 0821 Gross per 24 hour  Intake 1489 ml  Output 8025 ml  Net -6536 ml   Filed Weights   12/23/17 1249 12/24/17 0500 12/25/17 0458  Weight: 100.8 kg 103.3 kg 99.5 kg    Examination:  Awake Alert, Oriented X 3, No  new F.N deficits, Normal affect [place].AT,PERRAL Supple Neck,No JVD, No cervical lymphadenopathy appriciated.  Symmetrical Chest wall movement, Good air movement bilaterally, CTAB iRRR,No Gallops, Rubs, +ve mechanical aortic valve murmur, No Parasternal Heave +ve B.Sounds, Abd Soft, No tenderness, No organomegaly appriciated, No rebound - guarding or rigidity. No Cyanosis, Clubbing or edema, No new Rash or bruise  CBC:  Recent Labs  Lab 12/23/17 1259 12/24/17 0904  12/24/17 1809 12/25/17 0507  WBC 9.4 5.6 4.9 5.0  NEUTROABS 6.6  --   --   --   HGB 4.9* 7.9* 7.4* 7.4*  HCT 16.3* 23.6* 22.7* 22.7*  MCV 108.7* 93.7 93.4 95.0  PLT 221 126* 131* 329*   Basic Metabolic Panel: Recent Labs  Lab 12/23/17 1259 12/24/17 0904 12/25/17 0507 12/25/17 0716  NA 126* 128* 131*  --   K 5.2* 3.7 2.4*  --   CL 88* 90* 90*  --   CO2 23 23 29   --   GLUCOSE 123* 113* 104*  --   BUN 42* 40* 30*  --   CREATININE 2.39* 2.17* 1.77*  --   CALCIUM 8.4* 8.6* 8.2*  --   MG  --   --   --  1.8   GFR: Estimated Creatinine Clearance: 41.9 mL/min (A) (by C-G formula based on SCr of 1.77 mg/dL (H)).  Liver Function Tests: Recent Labs  Lab 12/23/17 1259  AST 24  ALT 8  ALKPHOS 77  BILITOT 1.2  PROT 5.8*  ALBUMIN 2.8*    Coagulation Profile: Recent Labs  Lab 12/20/17 1428 12/23/17 1259 12/24/17 0904 12/25/17 0507  INR 2.81 4.32* 2.16 1.79    Cardiac Enzymes: Recent Labs  Lab 12/24/17 0904 12/24/17 1809 12/24/17 2228  TROPONINI 0.03* <0.03 0.03*     CBG: Recent Labs  Lab 12/24/17 0757 12/24/17 1149 12/24/17 1647 12/24/17 2114 12/25/17 0757  GLUCAP 111* 129* 120* 103* 104*    Recent Results (from the past 240 hour(s))  MRSA PCR Screening     Status: None   Collection Time: 12/23/17  6:49 PM  Result Value Ref Range Status   MRSA by PCR NEGATIVE NEGATIVE Final    Comment:        The GeneXpert MRSA Assay (FDA approved for NASAL specimens only), is one component of a comprehensive MRSA colonization surveillance program. It is not intended to diagnose MRSA infection nor to guide or monitor treatment for MRSA infections. Performed at [facility], [place] [address] [place], [place] [address]      Scheduled Meds: . allopurinol  100 mg Oral Daily  . amiodarone  200 mg Oral BID  . docusate sodium  100 mg Oral BID  . ezetimibe-simvastatin  1 tablet Oral QHS  . Gerhardt's butt cream   Topical BID  . insulin aspart  0-9 Units  Subcutaneous TID WC  . torsemide  50 mg Oral BID   Continuous Infusions: . heparin 1,100 Units/hr (12/25/17 0949)  . potassium chloride 10 mEq (12/25/17 0945)     LOS: 2 days   Signature  Lala Lund M.D on 12/25/2017 at 9:56 AM  To page go to [email or web address] - password [facility]

## 2017-12-25 NOTE — Progress Notes (Signed)
Critical lab call: Potassium 2.4 message sent to On call night provider via Amion

## 2017-12-25 NOTE — Progress Notes (Signed)
ANTICOAGULATION CONSULT NOTE - Follow-Up Consult  Pharmacy Consult for heparin Indication: atrial fibrillation and mechanical valve  Allergies  Allergen Reactions  . Rifampin Rash    May have been caused by Vancomycin or Rifampin (??)  . Vancomycin Rash    May have been caused by Vancomycin or Rifampin (??)    Patient Measurements: Height: 6\' 2"  (188 cm) Weight: 219 lb 5.7 oz (99.5 kg) IBW/kg (Calculated) : 82.2  Heparin dosing weight 100.8 kg  Vital Signs: Temp: 98.5 F (36.9 C) (09/24 0625) Temp Source: Oral (09/24 0625) BP: 94/45 (09/24 0625) Pulse Rate: 80 (09/24 0625)  Labs: Recent Labs    12/23/17 1259 12/24/17 0904 12/24/17 1809 12/24/17 2228 12/25/17 0507  HGB 4.9* 7.9* 7.4*  --  7.4*  HCT 16.3* 23.6* 22.7*  --  22.7*  PLT 221 126* 131*  --  134*  APTT  --  38*  --   --   --   LABPROT 41.1* 23.9*  --   --  20.6*  INR 4.32* 2.16  --   --  1.79  HEPARINUNFRC  --   --  0.37  --  0.56  CREATININE 2.39* 2.17*  --   --  1.77*  TROPONINI  --  0.03* <0.03 0.03*  --     Estimated Creatinine Clearance: 41.9 mL/min (A) (by C-G formula based on SCr of 1.77 mg/dL (H)).   Medical History: Past Medical History:  Diagnosis Date  . Carotid artery disease ([place]) 1994   s/p left carotid endarerectomy   . Chronic atrial fibrillation (HCC)    a. on coumadin   . Chronic diastolic CHF (congestive heart failure) ([place])   . Diabetes mellitus without complication ([place])    dx 2016  . Dyspnea   . Heart murmur   . Hx of CABG    a. 1994  . Hypertension   . OSA (obstructive sleep apnea)   . Rheumatic fever   . S/P AVR (aortic valve replacement)    a. mechanical valve 1996  . Subclavian bypass stenosis ([place])   . 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 Vitamin B12 deficiency     Assessment: 82 yo M presented with weakness and bleeding from his mouth. Pt is on chronic warfarin for history of afib and mechanical  AVR. INR was 4.32 on admission, now subtherapeutic at 2.16. Already given 2.5mg  PO vitamin K and tranexamic acid topical has been ordered.   MD wishes to start heparin drip to target lower end of therapeutic range due to bleeding.  Heparin level this AM is slightly above goal at 0.56. Gum bleeding reported per RN note.   Goal of Therapy:  Heparin level 0.3-0.5 Monitor platelets by anticoagulation protocol: Yes   Plan:  Decrease Heparin to 1100 units/hr Heparin level in 8 hours Daily heparin level and CBC F/u bleeding  Hermelinda Diegel A. Levada Dy, PharmD, [place] Pager: [phone] Please utilize Amion for appropriate phone number to reach the unit pharmacist ([place])    12/25/2017 7:57 AM

## 2017-12-25 NOTE — Consult Note (Addendum)
Advanced Heart Failure Team Consult Note   Primary Physician: Cassandria Anger, MD PCP-Cardiologist:  [name], MD  HF MD: [name]   Reason for Consultation: Heart Failure   HPI:    [name] is seen today for evaluation of heart failure at the request of Dr Candiss Norse.    [name] is an 82 year old with history of  mechanical AVR 1994, OSA on CPAP, permanent atrial fibrillation on chronic coumadin therapy, chronic diastolic heart failure, CKD III,DM,CAD s/p CAGB 1994, COPDand severe mitral regurgitation.   Admitted 7/18-11/05/17 from [facility] office with A/C diastolic HF. Diuresed 83 lbs with lasix drip, metolazone, and diamox. Transitioned to torsemide 80 mg BID. Determined not to be a candidate for MitraClip. Dr Liston Beard consulted andrecommended aggressive medical therapy versus possible experimental percutaneous MVR as he felt very high risk for conventional surgery.Coarse complicated by drop in hemoglobin and symptomatic hypotension, requiring 1 unit pRBCs. GI consulted and did not recommend scoping. BB and spiro were stopped due to low BPs. Had some AKI assoicated with low BP, but resolved by discharge.   Admitted 12/12/2017 for oral surgery for multiple extractions and debridement. Hospital course was complicated by VT and anemia.  Started amio and electrolytes supplements. Received 2UPRBCs and feraheme. Discharged on 12/14/2017 with hemoglobin 7.6.    On 9/19 he was evaluated in the HF clinic. He was having ongoing bleeding from his gums. This improved with pressure. Hgb was 7.1 so he was set up for transfusion. Torsemide was held for 1 day and metolazone was stopped due to AKI. Creatinine had gone up from 1.2>1.9.  Weight at that time was 222 pounds. Transfusion was set up for 9/23 at [facility].   On 9/22 his wife called EMS due to fatigue and weakness. He had ongoing bleeding from his mouth. I Hgb was 4.9, INR 4.32, creatinine 2.4.   He was hypotensive. In ED he was given  250 cc NS bolus and vitamin K. Overall he has received 4UPBCs. Bleeding had stopped but restarted today.   This morning he started spitting out large clots of blood. Todays hgb is 7.4 and INR 1.8. Denies SOB/dizziness.   Echo 10/19/2017 - Left ventricle: The cavity size was mildly dilated. There was   mild concentric hypertrophy. Systolic function was normal. The   estimated ejection fraction was in the range of 55% to 65%. Wall   motion was normal; there were no regional wall motion   abnormalities. - Ventricular septum: The contour showed diastolic flattening and   systolic flattening. - Aortic valve: There was trivial regurgitation. Mean gradient (S):   11 mm Hg. Peak gradient (S): 27 mm Hg. Valve area (VTI): 1.87   cm^2. Valve area (Vmax): 1.45 cm^2. Valve area (Vmean): 1.87   cm^2. - Mitral valve: The findings are consistent with mild stenosis.   There was severe regurgitation. Valve area by continuity equation   (using LVOT flow): 1.85 cm^2. - Left atrium: The atrium was severely dilated. - Right ventricle: The cavity size was moderately dilated. Wall   thickness was normal. Systolic function was moderately reduced. - Right atrium: The atrium was severely dilated. - Pulmonary arteries: Systolic pressure was moderately increased.   PA peak pressure: 49 mm Hg (S). - Inferior vena cava: The vessel was dilated. The respirophasic   diameter changes were blunted (< 50%), consistent with elevated   central venous pressure. - Pericardium, extracardiac: There was no pericardial effusion.  Review of Systems: [y] =  yes, [ ]  = no   General: Weight gain [ ] ; Weight loss [ ] ; Anorexia [ ] ; Fatigue [ Y]; Fever [ ] ; Chills [ ] ; Weakness [Y ]  Cardiac: Chest pain/pressure [ ] ; Resting SOB [ ] ; Exertional SOB [ ] ; Orthopnea [ ] ; Pedal Edema [ ] ; Palpitations [ ] ; Syncope [ ] ; Presyncope [ ] ; Paroxysmal nocturnal dyspnea[ ]   Pulmonary: Cough [ ] ; Wheezing[ ] ; Hemoptysis[ ] ; Sputum [ ] ; Snoring [  ]  GI: Vomiting[ ] ; Dysphagia[ ] ; Melena[ ] ; Hematochezia [ ] ; Heartburn[ ] ; Abdominal pain [ ] ; Constipation [ ] ; Diarrhea [ ] ; BRBPR [ ]   GU: Hematuria[ ] ; Dysuria [ ] ; Nocturia[ ]   Vascular: Pain in legs with walking [ ] ; Pain in feet with lying flat [ ] ; Non-healing sores [ ] ; Stroke [ ] ; TIA [ ] ; Slurred speech [ ] ;  Neuro: Headaches[ ] ; Vertigo[ ] ; Seizures[ ] ; Paresthesias[ ] ;Blurred vision [ ] ; Diplopia [ ] ; Vision changes [ ]   Ortho/Skin: Arthritis [ ] ; Joint pain [Y ]; Muscle pain [ ] ; Joint swelling [ ] ; Back Pain [ ] ; Rash [ ]   Psych: Depression[ ] ; Anxiety[ ]   Heme: Bleeding problems [ Y]; Clotting disorders [ ] ; Anemia [ Y]  Endocrine: Diabetes [ Y]; Thyroid dysfunction[ ]   Home Medications Prior to Admission medications   Medication Sig Start Date End Date Taking? Authorizing Provider  allopurinol (ZYLOPRIM) 100 MG tablet Take 1 tablet (100 mg total) by mouth daily. 12/05/17  Yes Georgiana Shore, NP  amiodarone (PACERONE) 200 MG tablet Take 1 tablet (200 mg total) by mouth 2 (two) times daily. 12/14/17  Yes Shirley Friar, PA-C  ezetimibe-simvastatin (VYTORIN) 10-20 MG tablet Take 1 tablet by mouth at bedtime. 04/05/17  Yes [name], [name], MD  torsemide (DEMADEX) 100 MG tablet Take 1 tablet (100 mg total) by mouth 2 (two) times daily. 12/05/17  Yes Georgiana Shore, NP    Past Medical History: Past Medical History:  Diagnosis Date  . Carotid artery disease ([place]) 1994   s/p left carotid endarerectomy   . Chronic atrial fibrillation (HCC)    a. on coumadin   . Chronic diastolic CHF (congestive heart failure) ([place])   . Diabetes mellitus without complication ([place])    dx 2016  . Dyspnea   . Heart murmur   . Hx of CABG    a. 1994  . Hypertension   . OSA (obstructive sleep apnea)   . Rheumatic fever   . S/P AVR (aortic valve replacement)    a. mechanical valve 1996  . Subclavian bypass stenosis ([place])   . Temporary low platelet count (HCC)    chronic problem  since receiving aortic valve replacement  . Vitamin B12 deficiency     Past Surgical History: Past Surgical History:  Procedure Laterality Date  . [place]   replaced due to aortic stenosis, [employer] mechanical prostesis  . CAROTID ENDARTERECTOMY Left 1997   subclavian bypass Done in [place]  . CORONARY ARTERY BYPASS GRAFT  1994   w SVG to RCA and SVG to circumflex  . heart bypass     Done in [place]  . MULTIPLE EXTRACTIONS WITH ALVEOLOPLASTY Bilateral 12/12/2017   Procedure: Extraction of tooth #'s 1, 12,13,14,15,17,18,19, 29, and 30 with alveoloplasty and gross debridement of remaining teeth`;  Surgeon: Lenn Cal, DDS;  Location: [place] [facility];  Service: Oral Surgery;  Laterality: Bilateral;  MULTIPLE EXTRACTION WITH ALVEOLOPLASTY WITH PRE PROSTHETIC SURGERY AND GROSS DEBRIDEMENT OF  REMAINING TEETH  . TEE WITHOUT CARDIOVERSION N/A 10/23/2017   Procedure: TRANSESOPHAGEAL ECHOCARDIOGRAM (TEE);  Surgeon: Larey Dresser, MD;  Location: [facility] ENDOSCOPY;  Service: Cardiovascular;  Laterality: N/A;  . TONSILLECTOMY      Family History: Family History  Problem Relation Age of Onset  . Cancer Father        lung   . Hypertension Mother     Social History: Social History   Socioeconomic History  . Marital status: Married    Spouse name: Not on file  . Number of children: 3  . Years of education: Not on file  . Highest education level: Not on file  Occupational History  . Not on file  Social Needs  . Financial resource strain: Not on file  . Food insecurity:    Worry: Not on file    Inability: Not on file  . Transportation needs:    Medical: Not on file    Non-medical: Not on file  Tobacco Use  . Smoking status: Former Smoker    Packs/day: 1.00    Years: 30.00    Pack years: 30.00    Types: Cigarettes    Last attempt to quit: 04/03/1992    Years since quitting: 25.7  . Smokeless tobacco: Never Used  Substance and Sexual Activity  . Alcohol use: Yes     Comment: 4-6 ounces of wine daily  . Drug use: No    Comment: Half a cup a day.  Marland Kitchen Sexual activity: Not on file  Lifestyle  . Physical activity:    Days per week: Not on file    Minutes per session: Not on file  . Stress: Not on file  Relationships  . Social connections:    Talks on phone: Not on file    Gets together: Not on file    Attends religious service: Not on file    Active member of club or organization: Not on file    Attends meetings of clubs or organizations: Not on file    Relationship status: Not on file  Other Topics Concern  . Not on file  Social History Narrative   Patient is married and lives with his wife.   Youngest daughter lives in [place] also.   Patient has 2 other children.    Allergies:  Allergies  Allergen Reactions  . Rifampin Rash    May have been caused by Vancomycin or Rifampin (??)  . Vancomycin Rash    May have been caused by Vancomycin or Rifampin (??)    Objective:    Vital Signs:   Temp:  [97.9 F (36.6 C)-99.2 F (37.3 C)] 99 F (37.2 C) (09/24 0800) Pulse Rate:  [60-80] 80 (09/24 0625) Resp:  [17-24] 17 (09/24 0625) BP: (69-97)/(35-58) 85/44 (09/24 0800) SpO2:  [99 %] 99 % (09/24 0625) Weight:  [99.5 kg] 99.5 kg (09/24 0458) Last BM Date: 12/23/17  Weight change: Filed Weights   12/23/17 1249 12/24/17 0500 12/25/17 0458  Weight: 100.8 kg 103.3 kg 99.5 kg    Intake/Output:   Intake/Output Summary (Last 24 hours) at 12/25/2017 1057 Last data filed at 12/25/2017 0821 Gross per 24 hour  Intake 1489 ml  Output 8025 ml  Net -6536 ml      Physical Exam    General:  Pale. No resp difficulty HEENT: bleeding in his mouth. With large clots.  Neck: supple. JVP 6-7 . Carotids 2+ bilat; no bruits. No lymphadenopathy or thyromegaly appreciated. Cor: PMI nondisplaced. Irregular rate &  rhythm. No rubs, gallops. Mechanical S1  2.6 HSM. Lungs: clear Abdomen: soft, nontender, nondistended. No hepatosplenomegaly. No bruits or  masses. Good bowel sounds. Extremities: no cyanosis, clubbing, rash, edema Neuro: alert & orientedx3, cranial nerves grossly intact. moves all 4 extremities w/o difficulty. Affect pleasant   Telemetry  A fib 70s personally reviewed   EKG    A fib 76 bpm   Labs   Basic Metabolic Panel: Recent Labs  Lab 12/20/17 1428 12/23/17 1259 12/24/17 0904 12/25/17 0507 12/25/17 0716  NA 134* 126* 128* 131*  --   K 4.4 5.2* 3.7 2.4*  --   CL 90* 88* 90* 90*  --   CO2 29 23 23 29   --   GLUCOSE 118* 123* 113* 104*  --   BUN 37* 42* 40* 30*  --   CREATININE 1.92* 2.39* 2.17* 1.77*  --   CALCIUM 9.4 8.4* 8.6* 8.2*  --   MG  --   --   --   --  1.8    Liver Function Tests: Recent Labs  Lab 12/23/17 1259  AST 24  ALT 8  ALKPHOS 77  BILITOT 1.2  PROT 5.8*  ALBUMIN 2.8*   No results for input(s): LIPASE, AMYLASE in the last 168 hours. No results for input(s): AMMONIA in the last 168 hours.  CBC: Recent Labs  Lab 12/20/17 1428 12/23/17 1259 12/24/17 0904 12/24/17 1809 12/25/17 0507  WBC 6.9 9.4 5.6 4.9 5.0  NEUTROABS  --  6.6  --   --   --   HGB 7.1* 4.9* 7.9* 7.4* 7.4*  HCT 23.3* 16.3* 23.6* 22.7* 22.7*  MCV 105.9* 108.7* 93.7 93.4 95.0  PLT 179 221 126* 131* 134*    Cardiac Enzymes: Recent Labs  Lab 12/24/17 0904 12/24/17 1809 12/24/17 2228  TROPONINI 0.03* <0.03 0.03*    BNP: BNP (last 3 results) Recent Labs    08/01/17 1804 10/18/17 1510  BNP 784.4* 1,073.3*    ProBNP (last 3 results) Recent Labs    03/30/17 1147 05/25/17 1044 07/10/17 1523  PROBNP 1,858* 2,749* 411.0*     CBG: Recent Labs  Lab 12/24/17 0757 12/24/17 1149 12/24/17 1647 12/24/17 2114 12/25/17 0757  GLUCAP 111* 129* 120* 103* 104*    Coagulation Studies: Recent Labs    12/23/17 1259 12/24/17 0904 12/25/17 0507  LABPROT 41.1* 23.9* 20.6*  INR 4.32* 2.16 1.79     Imaging    No results found.   Medications:     Current Medications: . allopurinol  100 mg  Oral Daily  . amiodarone  200 mg Oral BID  . docusate sodium  100 mg Oral BID  . ezetimibe-simvastatin  1 tablet Oral QHS  . Gerhardt's butt cream   Topical BID  . insulin aspart  0-9 Units Subcutaneous TID WC  . torsemide  50 mg Oral BID     Infusions: . heparin 1,100 Units/hr (12/25/17 0949)  . potassium chloride 10 mEq (12/25/17 0945)       Patient Profile   [name] is an 82 year old with history of  mechanical AVR 1994, OSA on CPAP, permanent atrial fibrillation on chronic coumadin therapy, chronic diastolic heart failure, CKD III,DM,CAD s/p CAGB 1994, COPDand severe mitral regurgitation.   Admitted with symptomatic anemia. Hgb on admit 4.9   Assessment/Plan   1. Anemia due to acute blood loss from previous oral surgery on 12/12/2017. Hgb on admit 4.9. INR was supra therapuetic . - Overall he has received 4UPRBCs and  will receive another unit of blood today.  - Continue to bleed from his mouth - I have called Dr Enrique Sack. ? If amicar may be used in the hospital.   2. AKI on CKD Stage III -Baseline creatinine 1.5  -Creatinine on admit 2.4. Diuretics have been cut back.  -Todays creatinine is 1.77.  -Continue to follow daily BMET   3. Chronic Diastolic Heart Failure, RV failure ECHO 10/2017 EF 55-60% RV moderately reduced. RA severely dilated.  -Volume status stable on reduced torsemide. Continue torsemide 50 mg twice a day.  -Renal function is improving.   4. Chronic A fib  Rate controlled  -Off coumadin. INR 1.8  -On heparin drip   5. Hypokalemia K 2.4  K supp by primary team  6. Severe Andrew  10/24/2017 -->TEE with severe Andrew possible rheumatic MV. Structural Heart Team evaluated. Not a candidate for clip.  He has been seen by Dr. [name] who recommended aggressive medical therapy versus possible experimental percutaneous MVR as he felt very high risk for conventional surgery.   7. Mechanical Aortic Valve -Currently off coumadin. On heparin drip. -Once  bleeding stops will need to resume coumadin  - INR goal 2.5-3.5   8. Thrombocytopenia, chronic  -Platelets on admit 221 but this is an outlier.  -Platelets today, 134.   9. Hyponatremia  Sodium 131. BMET in am.   Medication concerns reviewed with patient and pharmacy team. Barriers identified: none   Length of Stay: 2  Darrick Grinder, NP  12/25/2017, 10:57 AM  Advanced Heart Failure Team Pager [phone] (M-F; 7a - 4p)  Please contact [facility] for night-coverage after hours (4p -7a ) and weekends on [email or web address]  Discussed with Dr Haroldine Laws. He recommended stopping heparin drip until bleeding stops.   Amy Clegg 12:16 PM   Patient seen and examined with the above-signed Advanced Practice Provider and/or Housestaff. I personally reviewed laboratory data, imaging studies and relevant notes. I independently examined the patient and formulated the important aspects of the plan. I have edited the note to reflect any of my changes or salient points. I have personally discussed the plan with the patient and/or family.  82 y/o male with COPD, CAD s/p CABG and mechanical AVR in 1994 and severe ME. Recently underwent dental extractions in possible preparation for MitraClip. Post extraction course c/b significant oral bleeding, VT and symptomatic anemia. Now readmitted with severe recurrent bleeding with hgb 4.9, hypotension and AKI in setting of INR 4.3. He has received 5u RBCs and vitamin K. INR now 1.8 but continues to have significant oral bleeding/oozing.   I think the only option here is to stop his heparin for 48-72 hours in order for [place] to get control of his bleeding and prevent any further end-organ damage. With a mechanical valve in the aortic position risk of being off anti-coagulation for even up to several weeks is quite low. I have discussed with Dr. [name] (who knows patient well) and he agrees. Will stop heparin for now and plan to restart in 2-3 days if/when bleeding has resolved.  Watch volume status closely in setting of multiple transfusions.   Glori Bickers, MD  8:06 PM

## 2017-12-25 NOTE — Plan of Care (Signed)
Continues to monitor progress, patient bleeding in the gum, critical labs notified to provider, patient is alert, oriented and verbal, no signs and symptoms of distress noted

## 2017-12-25 NOTE — Progress Notes (Signed)
PT Cancellation Note  Patient Details Name: [name] MRN: [id number] DOB: 09-20-1935   Cancelled Treatment:    Reason Eval/Treat Not Completed: Other (comment). Pt with low INR and increased bleeding from mouth today noted per chart review. Will follow-up for PT treatment.  Mabeline Caras, PT, DPT Acute Rehabilitation Services  Pager [phone] Office [place] 12/25/2017, 1:09 PM

## 2017-12-26 DIAGNOSIS — Z7901 Long term (current) use of anticoagulants: Secondary | ICD-10-CM

## 2017-12-26 DIAGNOSIS — I482 Chronic atrial fibrillation: Secondary | ICD-10-CM

## 2017-12-26 LAB — BASIC METABOLIC PANEL
Anion gap: 10 (ref 5–15)
BUN: 21 mg/dL (ref 8–23)
CO2: 27 mmol/L (ref 22–32)
CREATININE: 1.44 mg/dL — AB (ref 0.61–1.24)
Calcium: 7.9 mg/dL — ABNORMAL LOW (ref 8.9–10.3)
Chloride: 95 mmol/L — ABNORMAL LOW (ref 98–111)
GFR calc Af Amer: 51 mL/min — ABNORMAL LOW (ref >60)
GFR, EST NON AFRICAN AMERICAN: 44 mL/min — AB (ref >60)
GLUCOSE: 102 mg/dL — AB (ref 70–99)
Potassium: 3.1 mmol/L — ABNORMAL LOW (ref 3.5–5.1)
Sodium: 132 mmol/L — ABNORMAL LOW (ref 135–145)

## 2017-12-26 LAB — CBC
HCT: 22.7 % — ABNORMAL LOW (ref 39.0–52.0)
HEMOGLOBIN: 7 g/dL — AB (ref 13.0–17.0)
MCH: 30.6 pg (ref 26.0–34.0)
MCHC: 30.8 g/dL (ref 30.0–36.0)
MCV: 99.1 fL (ref 78.0–100.0)
Platelets: 124 10*3/uL — ABNORMAL LOW (ref 150–400)
RBC: 2.29 MIL/uL — ABNORMAL LOW (ref 4.22–5.81)
RDW: 20.6 % — AB (ref 11.5–15.5)
WBC: 5.9 10*3/uL (ref 4.0–10.5)

## 2017-12-26 LAB — PROTIME-INR
INR: 1.43
PROTHROMBIN TIME: 17.3 s — AB (ref 11.4–15.2)

## 2017-12-26 LAB — GLUCOSE, CAPILLARY
GLUCOSE-CAPILLARY: 142 mg/dL — AB (ref 70–99)
Glucose-Capillary: 95 mg/dL (ref 70–99)
Glucose-Capillary: 135 mg/dL — ABNORMAL HIGH (ref 70–99)
Glucose-Capillary: 85 mg/dL (ref 70–99)

## 2017-12-26 LAB — MAGNESIUM: Magnesium: 1.7 mg/dL (ref 1.7–2.4)

## 2017-12-26 LAB — PREPARE RBC (CROSSMATCH)

## 2017-12-26 MED ORDER — AMINOCAPROIC ACID 0.25 GM/ML PO SOLN
4.0000 g | ORAL | Status: DC
  Filled 2017-12-26 (×7): qty 16

## 2017-12-26 MED ORDER — SODIUM CHLORIDE 0.9% IV SOLUTION
Freq: Once | INTRAVENOUS | Status: AC
  Administered 2017-12-26: 11:00:00 via INTRAVENOUS

## 2017-12-26 MED ORDER — AMINOCAPROIC ACID SOLUTION 5% (50 MG/ML)
5.0000 mL | ORAL | Status: AC
  Administered 2017-12-26 – 2017-12-27 (×22): 5 mL via ORAL
  Filled 2017-12-26 (×2): qty 100

## 2017-12-26 MED ORDER — AMIODARONE HCL 200 MG PO TABS
200.0000 mg | ORAL_TABLET | Freq: Every day | ORAL | Status: DC
  Administered 2017-12-26 – 2017-12-28 (×3): 200 mg via ORAL
  Filled 2017-12-26 (×3): qty 1

## 2017-12-26 MED ORDER — MAGNESIUM SULFATE IN D5W 1-5 GM/100ML-% IV SOLN
1.0000 g | Freq: Once | INTRAVENOUS | Status: AC
  Administered 2017-12-26: 1 g via INTRAVENOUS
  Filled 2017-12-26: qty 100

## 2017-12-26 MED ORDER — POTASSIUM CHLORIDE CRYS ER 20 MEQ PO TBCR
40.0000 meq | EXTENDED_RELEASE_TABLET | ORAL | Status: AC
  Administered 2017-12-26 (×3): 40 meq via ORAL
  Filled 2017-12-26 (×2): qty 2

## 2017-12-26 MED ORDER — WARFARIN - PHARMACIST DOSING INPATIENT
Freq: Every day | Status: DC
  Administered 2017-12-26 – 2017-12-27 (×2)

## 2017-12-26 MED ORDER — WARFARIN SODIUM 2.5 MG PO TABS
2.5000 mg | ORAL_TABLET | Freq: Once | ORAL | Status: AC
  Administered 2017-12-26: 2.5 mg via ORAL
  Filled 2017-12-26: qty 1

## 2017-12-26 NOTE — Progress Notes (Signed)
ANTICOAGULATION CONSULT NOTE - [place] for warfarin Indication: atrial fibrillation and mechanical valve  Allergies  Allergen Reactions  . Rifampin Rash    May have been caused by Vancomycin or Rifampin (??)  . Vancomycin Rash    May have been caused by Vancomycin or Rifampin (??)    Patient Measurements: Height: 6\' 2"  (188 cm) Weight: 205 lb (93 kg) IBW/kg (Calculated) : 82.2  Heparin dosing weight 100.8 kg  Vital Signs: Temp: 98.1 F (36.7 C) (09/25 0900) Temp Source: Oral (09/25 0900) BP: 91/53 (09/25 0900) Pulse Rate: 66 (09/25 0900)  Labs: Recent Labs    12/24/17 0904 12/24/17 1809 12/24/17 2228 12/25/17 0507 12/25/17 1406 12/25/17 1548 12/26/17 0500  HGB 7.9* 7.4*  --  7.4* 7.7*  --  7.0*  HCT 23.6* 22.7*  --  22.7* 24.4*  --  22.7*  PLT 126* 131*  --  134*  --   --  124*  APTT 38*  --   --   --   --   --   --   LABPROT 23.9*  --   --  20.6*  --   --  17.3*  INR 2.16  --   --  1.79  --   --  1.43  HEPARINUNFRC  --  0.37  --  0.56  --  0.20*  --   CREATININE 2.17*  --   --  1.77*  --   --  1.44*  TROPONINI 0.03* <0.03 0.03*  --   --   --   --     Estimated Creatinine Clearance: 47.6 mL/min (A) (by C-G formula based on SCr of 1.44 mg/dL (H)).   Medical History: Past Medical History:  Diagnosis Date  . Carotid artery disease ([place]) 1994   s/p left carotid endarerectomy   . Chronic atrial fibrillation (HCC)    a. on coumadin   . Chronic diastolic CHF (congestive heart failure) ([place])   . Diabetes mellitus without complication ([place])    dx 2016  . Dyspnea   . Heart murmur   . Hx of CABG    a. 1994  . Hypertension   . OSA (obstructive sleep apnea)   . Rheumatic fever   . S/P AVR (aortic valve replacement)    a. mechanical valve 1996  . Subclavian bypass stenosis ([place])   . 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 Vitamin B12 deficiency     Assessment: 82 yo M  presented with weakness and bleeding from his mouth. Pt is on chronic warfarin for history of afib and mechanical AVR. INR was 4.32 on admission, now subtherapeutic after holding and reversal with vitamin K. Pt was initially bridged with IV heparin but given ongoing bleeding and low Hgb this was stopped 9/24.   Pt to resume warfarin slowly tonight, INR 1.4, Hgb 7 s/p pRBCs today.  *PTA Warfarin Dose = 7.5mg  Mon/Wed/Fri, 5mg  all other days  Goal of Therapy:  Heparin level 0.3-0.5 INR 2.5-3.5 Monitor platelets by anticoagulation protocol: Yes   Plan:  -Hold heparin for now -Resume warfarin slowly tonight at 2.5mg  PO x1 tonight -Daily INR, CBC  Arrie Senate, PharmD, BCPS Clinical Pharmacist [phone] Please check AMION for all [place] numbers 12/26/2017

## 2017-12-26 NOTE — Progress Notes (Addendum)
Advanced Heart Failure Rounding Note  PCP-Cardiologist: Belva Crome III, MD   Subjective:    Feeling much better this am. Bleeding has finally stopped with holding heparin. No bleeding since yesterday afternoon. Denies SOB, lightheadedness or dizziness.   Hgb 7.7 -> 7.0. Received 1 uPRBCs yesterday (total of 5). Ordered for 2 units today with continued downtrend despite transfusion yesterday.   Objective:   Weight Range: 93 kg Body mass index is 26.32 kg/m.   Vital Signs:   Temp:  [98.3 F (36.8 C)-98.4 F (36.9 C)] 98.3 F (36.8 C) (09/25 0308) Pulse Rate:  [69-79] 73 (09/25 0616) Resp:  [15-17] 15 (09/25 0616) BP: (73-96)/(46-59) 90/46 (09/25 0616) SpO2:  [97 %-100 %] 100 % (09/25 0616) Weight:  [93 kg] 93 kg (09/25 0308) Last BM Date: 12/25/17  Weight change: Filed Weights   12/24/17 0500 12/25/17 0458 12/26/17 0308  Weight: 103.3 kg 99.5 kg 93 kg    Intake/Output:   Intake/Output Summary (Last 24 hours) at 12/26/2017 0842 Last data filed at 12/26/2017 0624 Gross per 24 hour  Intake 600 ml  Output 1540 ml  Net -940 ml      Physical Exam    General:  Pale. NAD.  HEENT: No oral bleeding.  Neck: Supple. JVP 6-7 cm. Carotids 2+ bilat; no bruits. No lymphadenopathy or thyromegaly appreciated. Cor: PMI nondisplaced. Irregularly irregular. Mechanical S1. 2/6 HSM. Lungs: Clear Abdomen: Soft, nontender, nondistended. No hepatosplenomegaly. No bruits or masses. Good bowel sounds. Extremities: No cyanosis, clubbing, or rash. Trace to 1+ ankle edema.  Neuro: Alert & orientedx3, cranial nerves grossly intact. moves all 4 extremities w/o difficulty. Affect pleasant   Telemetry   Afib 60-70s, personally reviewed.   EKG    No new tracings.    Labs    CBC Recent Labs    12/23/17 1259  12/25/17 0507 12/25/17 1406 12/26/17 0500  WBC 9.4   < > 5.0  --  5.9  NEUTROABS 6.6  --   --   --   --   HGB 4.9*   < > 7.4* 7.7* 7.0*  HCT 16.3*   < > 22.7* 24.4*  22.7*  MCV 108.7*   < > 95.0  --  99.1  PLT 221   < > 134*  --  124*   < > = values in this interval not displayed.   Basic Metabolic Panel Recent Labs    12/25/17 0507 12/25/17 0716 12/26/17 0500  NA 131*  --  132*  K 2.4*  --  3.1*  CL 90*  --  95*  CO2 29  --  27  GLUCOSE 104*  --  102*  BUN 30*  --  21  CREATININE 1.77*  --  1.44*  CALCIUM 8.2*  --  7.9*  MG  --  1.8 1.7   Liver Function Tests Recent Labs    12/23/17 1259  AST 24  ALT 8  ALKPHOS 77  BILITOT 1.2  PROT 5.8*  ALBUMIN 2.8*   No results for input(s): LIPASE, AMYLASE in the last 72 hours. Cardiac Enzymes Recent Labs    12/24/17 0904 12/24/17 1809 12/24/17 2228  TROPONINI 0.03* <0.03 0.03*    BNP: BNP (last 3 results) Recent Labs    08/01/17 1804 10/18/17 1510  BNP 784.4* 1,073.3*    ProBNP (last 3 results) Recent Labs    03/30/17 1147 05/25/17 1044 07/10/17 1523  PROBNP 1,858* 2,749* 411.0*     D-Dimer No results for  input(s): DDIMER in the last 72 hours. Hemoglobin A1C No results for input(s): HGBA1C in the last 72 hours. Fasting Lipid Panel No results for input(s): CHOL, HDL, LDLCALC, TRIG, CHOLHDL, LDLDIRECT in the last 72 hours. Thyroid Function Tests No results for input(s): TSH, T4TOTAL, T3FREE, THYROIDAB in the last 72 hours.  Invalid input(s): FREET3  Other results:   Imaging     No results found.   Medications:     Scheduled Medications: . sodium chloride   Intravenous Once  . allopurinol  100 mg Oral Daily  . amiodarone  200 mg Oral BID  . docusate sodium  100 mg Oral BID  . ezetimibe-simvastatin  1 tablet Oral QHS  . Gerhardt's butt cream   Topical BID  . insulin aspart  0-9 Units Subcutaneous TID WC  . potassium chloride  40 mEq Oral Q4H  . torsemide  50 mg Oral BID    Infusions: . magnesium sulfate 1 - 4 g bolus IVPB       PRN Medications:  acetaminophen **OR** [DISCONTINUED] acetaminophen, HYDROcodone-acetaminophen, levalbuterol,  magnesium citrate, ondansetron **OR** ondansetron (ZOFRAN) IV, senna-docusate, sorbitol, traZODone    Patient Profile   Mr Phillippi is an 82 year old with history of  mechanical AVR 1994, OSA on CPAP, permanent atrial fibrillation on chronic coumadin therapy, chronic diastolic heart failure, CKD III,DM,CAD s/p CAGB 1994, COPDand severe mitral regurgitation.   Admitted with symptomatic anemia. Hgb on admit 4.9   Assessment/Plan   1. Anemia due to acute blood loss from previous oral surgery on 12/12/2017. - Hgb on admit 4.9. INR was supra therapuetic . - Overall he has received 5 uPRBCs. Ordered for 2 more today with Hgb 7.0 despite 1 unit yesterday.  - Continue to bleed from his mouth - Have called Dr Enrique Sack.  - OK to use amicar.  2. AKI on CKD Stage III - Baseline creatinine 1.5. - Cr back to baseline with transfusion and torsemide reduction.  - Continue to follow daily BMET   3. Chronic Diastolic Heart Failure, RV failure - ECHO 10/2017 EF 55-60% RV moderately reduced. RA severely dilated.  - Volume status stable.  - Continue torsemide 50 mg BID for now.  - Cr 1.77 -> 1.44  4. Chronic A fib  - Rate controlled.  - Off coumadin. INR 1.43.   - Heparin drip on hold with bleeding.   5. Hypokalemia - K 3.1 this am. Supp ordered.    6. 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 Structural Heart Team evaluated. Not a candidate for clip.  - He has beenseen by Dr. [name] who recommended aggressive medical therapy versus possible experimental percutaneous MVR as he felt very high risk for conventional surgery.  - No change.   7. Mechanical Aortic Valve - Holding AC for now with significant bleeding and anemia.  - Once bleeding stops will need to resume coumadin  - INR goal 2.5-3.5   8. Thrombocytopenia, chronic  - Platelets on admit 221 but this is an outlier.  - Platelets today 124.   9. Hyponatremia  - Na 132. Follow.   Medication  concerns reviewed with patient and pharmacy team. Barriers identified: None.  Length of Stay: 3  Annamaria Helling  12/26/2017, 8:42 AM  Advanced Heart Failure Team Pager [phone] (M-F; 7a - 4p)  Please contact [facility] for night-coverage after hours (4p -7a ) and weekends on [email or web address]  Patient seen with PA, agree with the above note.  Bleeding from his mouth has stopped.  No complaints this morning, no dyspnea.    On exam, JVP 8 cm.  Trace to 1+ ankle edema.  Irregular S1S2 with mechanical S2, 2/6 HSM apex.   1. Anemia: Blood loss from oral surgery.  Bleeding in mouth appears to have stopped today.  Hgb 7.  - Plan noted for 2 units PRBCs.   - Restart warfarin today.  If no further bleeding, can restart heparin bridging tomorrow.  2. Chronic diastolic CHF with prominent RV failure: Echomost recentlyshowed EF 55-65%, mechanical aortic valve functioning normally, mild mitral stenosis/severe mitral regurgitation, RV moderately dilated with moderately decreased systolic function, PASP 49 mmHg. Severe MRlikelycontributing to the significant RV failure. Volume status looks good.  Torsemide was cut back to 50 mg bid at admission with elevated creatinine. Creatinine now back down to 1.4.  - If creatinine stable tomorrow, will increase torsemide to 80 mg bid (will see if we can lower the chronic dose a bit).  - Replace K.  3. Mechanical aortic valve:Appeared to function well onlast echo.  -Restartedwarfarin today with INR goal 2.5-3.5 (also with atrial fibrillation).  - Hold heparin today, can start tomorrow as bridge if no further mouth bleeding.  4. Atrial fibrillation: Chronic. Rate controlled 70s.No change. 5. CAD: s/p CABG.No CP. - Continue Vytorin.  6. COPD:On home oxygen. No change. 7. OSA: Continue home CPAP.No change. 8. Acute on CKD: Stage 3. Creatinineback near baseline 1.4. 9. Thrombocytopenia: This appears chronic, had before this  admission. No change 10. 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 Structural Heart team evaluated. It does not appear that he will be a Mitraclip candidate. He wasseen by Dr. [name] who recommended aggressive medical therapy versus possible experimental percutaneous MVR as he felt very high risk for conventional surgery. Now that he has lost almost > 90 lbs with diuresis over several months, will reassess surgical risk with Dr. [name].  11. VT: Last hospital admission, patient had long run of VT.  BP felt too low for Coreg.  - He has been on amiodarone 200 mg bid, can decrease to 200 mg daily today.   Loralie Champagne 12/26/2017 10:05 AM

## 2017-12-26 NOTE — Progress Notes (Signed)
PT Cancellation Note  Patient Details Name: [name] MRN: [id number] DOB: [birth year]   Cancelled Treatment:    Reason Eval/Treat Not Completed: Medical issues which prohibited therapy. Patient with low hemoglobin and currently up in chair receiving blood transfusion. Will follow.  Ellamae Sia, PT, DPT Acute Rehabilitation Services Pager [phone] Office [phone]    Willy Eddy 12/26/2017, 2:08 PM

## 2017-12-26 NOTE — Care Management Important Message (Signed)
Important Message  Patient Details  Name: [name] MRN: [id number] Date of Birth: 1936-02-21   Medicare Important Message Given:  Yes    [name] 12/26/2017, 4:20 PM

## 2017-12-26 NOTE — Progress Notes (Signed)
[place] TEAM 1 - Stepdown/ICU TEAM  [name]  IPJ:[id number] DOB: 1935/11/23 DOA: 12/23/2017 PCP: Cassandria Anger, MD    Brief Narrative:  82 y.o. M w/ a hx of mechanical aortic valve (1994), OSA on CPAP, permanent A. fib, chronic anticoagulant therapy with Coumadin, chronic combined CHF, CKD stage III, DM, CAD with CABG 1994, COPD, severe mitral regurgitation, carotid stenosis with left carotid enterotomy, HTN, and HLD who presented to ED with generalized weakness and headache.  He reported a tooth extraction on 9/11 after which his bleeding had not stopped. He continued to take his warfarin.  In the ED he was found to have a BP of 88/51, a Hgb of 4.9, and an INR of 4.32.  Significant Events: 9/22 admit   Subjective: -Patient  reports no bleeding from mouth overnight, last episode was yesterday evening, otherwise denies any other complaints.   Assessment & Plan:  Acute blood loss anemia - bleeding after dental surgery - ,patient had 10 teeth  extraction by Dr. Dorothyann Gibbs for dental infection, supportive course was complicated with significant bleed, when he required some transfusions as well, and Amicar rinses. -So far received 4 units PRBC, hemoglobin is 7 this morning, I will transfuse 2 units PRBC. - I will resume on Amicar.  -Anticoagulation management per cardiology, heparin has been stopped yesterday afternoon given significant bleed, discussed with cardiology, they will resume on warfarin today .  Recent Labs  Lab 12/24/17 0904 12/24/17 1809 12/25/17 0507 12/25/17 1406 12/26/17 0500  HGB 7.9* 7.4* 7.4* 7.7* 7.0*   Lab Results  Component Value Date   INR 1.43 12/26/2017   INR 1.79 12/25/2017   INR 2.16 12/24/2017    Supratherapeutic INR  - chronically on Coumadin due to mechanical aortic valve + A. Fib , tented with elevated INR, now INR subtherapeutic, was on heparin GTT, has been on hold since yesterday afternoon per cardiology given significant mouth bleed,  regulation management per cardiology.  Chronic hypotension  - chronic issue currently asymptomatic monitor.  On moderate to high dose of diuretic at baseline.  Acute renal insufficiency  -Due to to volume depletion from anemia, resolved with transfusion  Hyponatremia  -Improving with initiation of torsemide, CHF team following .  Hypokalemia  -potassium 3.1 today, repleted  HLD -continue home medication no acute issue.  On combination of statin along with ezetimibe.   Chronic atrial fibrillation [place] vas 2 score of at least 4.  Continue combination of amiodarone and anticoagulation as above again cardiology team has been reconsulted.   Chronic diastolic CHF w/ prominent RV failure  -   Last TTE 10/23/2017, EF 55-60%, sever MR, mechanical AV - followed by CHF Team, have been reconsulted, continue oral diuretic for now.  DM2 - CBG well controlled on diet control.  Coronary artery disease with history of myocardial infarction s/p CABG - Transfuse as needed to keep Hgb close to 7.5 - 8 .    DVT prophylaxis: IV heparin (on hold) Code Status: FULL CODE Family Communication: no family present at time of exam  Disposition Plan: Tele  Consultants: Cards  Antimicrobials: none   Objective: Blood pressure (!) 91/53, pulse 66, temperature 98.1 F (36.7 C), temperature source Oral, resp. rate 15, height 6\' 2"  (1.88 m), weight 93 kg, SpO2 100 %.  Intake/Output Summary (Last 24 hours) at 12/26/2017 1206 Last data filed at 12/26/2017 0624 Gross per 24 hour  Intake 600 ml  Output 1540 ml  Net -940 ml   [employer]  12/24/17 0500 12/25/17 0458 12/26/17 0308  Weight: 103.3 kg 99.5 kg 93 kg    Examination:  Awake Alert, Oriented X 3, No new F.N deficits, Normal affect Oral cavity with no evidence of fresh or dried blood Symmetrical Chest wall movement, Good air movement bilaterally, CTAB RRR,No Gallops,Rubs or new Murmurs, No Parasternal Heave +ve B.Sounds, Abd Soft, No  tenderness, No rebound - guarding or rigidity. No Cyanosis, Clubbing or edema, No new Rash or bruise     CBC:  Recent Labs  Lab 12/23/17 1259  12/24/17 1809 12/25/17 0507 12/25/17 1406 12/26/17 0500  WBC 9.4   < > 4.9 5.0  --  5.9  NEUTROABS 6.6  --   --   --   --   --   HGB 4.9*   < > 7.4* 7.4* 7.7* 7.0*  HCT 16.3*   < > 22.7* 22.7* 24.4* 22.7*  MCV 108.7*   < > 93.4 95.0  --  99.1  PLT 221   < > 131* 134*  --  124*   < > = values in this interval not displayed.   Basic Metabolic Panel: Recent Labs  Lab 12/24/17 0904 12/25/17 0507 12/25/17 0716 12/26/17 0500  NA 128* 131*  --  132*  K 3.7 2.4*  --  3.1*  CL 90* 90*  --  95*  CO2 23 29  --  27  GLUCOSE 113* 104*  --  102*  BUN 40* 30*  --  21  CREATININE 2.17* 1.77*  --  1.44*  CALCIUM 8.6* 8.2*  --  7.9*  MG  --   --  1.8 1.7   GFR: Estimated Creatinine Clearance: 47.6 mL/min (A) (by C-G formula based on SCr of 1.44 mg/dL (H)).  Liver Function Tests: Recent Labs  Lab 12/23/17 1259  AST 24  ALT 8  ALKPHOS 77  BILITOT 1.2  PROT 5.8*  ALBUMIN 2.8*    Coagulation Profile: Recent Labs  Lab 12/20/17 1428 12/23/17 1259 12/24/17 0904 12/25/17 0507 12/26/17 0500  INR 2.81 4.32* 2.16 1.79 1.43    Cardiac Enzymes: Recent Labs  Lab 12/24/17 0904 12/24/17 1809 12/24/17 2228  TROPONINI 0.03* <0.03 0.03*     CBG: Recent Labs  Lab 12/25/17 1156 12/25/17 1633 12/25/17 2238 12/26/17 0803 12/26/17 1157  GLUCAP 104* 111* 112* 95 135*    Recent Results (from the past 240 hour(s))  MRSA PCR Screening     Status: None   Collection Time: 12/23/17  6:49 PM  Result Value Ref Range Status   MRSA by PCR NEGATIVE NEGATIVE Final    Comment:        The GeneXpert MRSA Assay (FDA approved for NASAL specimens only), is one component of a comprehensive MRSA colonization surveillance program. It is not intended to diagnose MRSA infection nor to guide or monitor treatment for MRSA infections. Performed  at [facility], [place] [address], [place], [place] [address]      Scheduled Meds: . allopurinol  100 mg Oral Daily  . aminocaproic acid  5 mL Oral Q1H  . amiodarone  200 mg Oral Daily  . docusate sodium  100 mg Oral BID  . ezetimibe-simvastatin  1 tablet Oral QHS  . Gerhardt's butt cream   Topical BID  . insulin aspart  0-9 Units Subcutaneous TID WC  . potassium chloride  40 mEq Oral Q4H  . torsemide  50 mg Oral BID  . warfarin  2.5 mg Oral ONCE-1800  . Warfarin -  Pharmacist Dosing Inpatient   Does not apply q1800   Continuous Infusions:    LOS: 3 days   Signature  Phillips Climes M.D on 12/26/2017 at 12:06 PM  To page go to [email or web address] - password [facility]

## 2017-12-27 ENCOUNTER — Other Ambulatory Visit (HOSPITAL_COMMUNITY): Payer: Self-pay | Admitting: Internal Medicine

## 2017-12-27 DIAGNOSIS — R791 Abnormal coagulation profile: Secondary | ICD-10-CM

## 2017-12-27 LAB — TYPE AND SCREEN
ABO/RH(D): O NEG
Antibody Screen: NEGATIVE
UNIT DIVISION: 0
UNIT DIVISION: 0
UNIT DIVISION: 0
Unit division: 0
Unit division: 0
Unit division: 0

## 2017-12-27 LAB — GLUCOSE, CAPILLARY
GLUCOSE-CAPILLARY: 98 mg/dL (ref 70–99)
GLUCOSE-CAPILLARY: 101 mg/dL — AB (ref 70–99)
Glucose-Capillary: 104 mg/dL — ABNORMAL HIGH (ref 70–99)
Glucose-Capillary: 116 mg/dL — ABNORMAL HIGH (ref 70–99)

## 2017-12-27 LAB — BPAM RBC
BLOOD PRODUCT EXPIRATION DATE: 201910052359
BLOOD PRODUCT EXPIRATION DATE: 201910192359
BLOOD PRODUCT EXPIRATION DATE: 201910192359
Blood Product Expiration Date: 201910012359
Blood Product Expiration Date: 201910192359
Blood Product Expiration Date: 201910192359
ISSUE DATE / TIME: 201909230040
ISSUE DATE / TIME: 201909230401
ISSUE DATE / TIME: 201909251339
ISSUE DATE / TIME: 201909221546
ISSUE DATE / TIME: 201909222105
ISSUE DATE / TIME: 201909251820
UNIT TYPE AND RH: 9500
UNIT TYPE AND RH: 9500
UNIT TYPE AND RH: 9500
UNIT TYPE AND RH: 9500
UNIT TYPE AND RH: 9500
Unit Type and Rh: 9500

## 2017-12-27 LAB — BASIC METABOLIC PANEL
ANION GAP: 8 (ref 5–15)
BUN: 18 mg/dL (ref 8–23)
CALCIUM: 8 mg/dL — AB (ref 8.9–10.3)
CO2: 29 mmol/L (ref 22–32)
CREATININE: 1.24 mg/dL (ref 0.61–1.24)
Chloride: 94 mmol/L — ABNORMAL LOW (ref 98–111)
GFR, EST NON AFRICAN AMERICAN: 53 mL/min — AB (ref >60)
GLUCOSE: 99 mg/dL (ref 70–99)
Potassium: 3.2 mmol/L — ABNORMAL LOW (ref 3.5–5.1)
Sodium: 131 mmol/L — ABNORMAL LOW (ref 135–145)

## 2017-12-27 LAB — CBC
HCT: 26.9 % — ABNORMAL LOW (ref 39.0–52.0)
HEMOGLOBIN: 8.8 g/dL — AB (ref 13.0–17.0)
MCH: 30.8 pg (ref 26.0–34.0)
MCHC: 32.7 g/dL (ref 30.0–36.0)
MCV: 94.1 fL (ref 78.0–100.0)
PLATELETS: 117 10*3/uL — AB (ref 150–400)
RBC: 2.86 MIL/uL — ABNORMAL LOW (ref 4.22–5.81)
RDW: 20.3 % — ABNORMAL HIGH (ref 11.5–15.5)
WBC: 7.5 10*3/uL (ref 4.0–10.5)

## 2017-12-27 LAB — PROTIME-INR
INR: 1.36
Prothrombin Time: 16.6 seconds — ABNORMAL HIGH (ref 11.4–15.2)

## 2017-12-27 LAB — HEPARIN LEVEL (UNFRACTIONATED): HEPARIN UNFRACTIONATED: 0.24 [IU]/mL — AB (ref 0.30–0.70)

## 2017-12-27 MED ORDER — POLYETHYLENE GLYCOL 3350 17 G PO PACK
17.0000 g | PACK | Freq: Two times a day (BID) | ORAL | Status: AC
  Administered 2017-12-27: 17 g via ORAL
  Filled 2017-12-27 (×2): qty 1

## 2017-12-27 MED ORDER — TORSEMIDE 20 MG PO TABS
80.0000 mg | ORAL_TABLET | Freq: Two times a day (BID) | ORAL | Status: DC
  Administered 2017-12-27 – 2017-12-28 (×2): 80 mg via ORAL
  Filled 2017-12-27 (×2): qty 4

## 2017-12-27 MED ORDER — TORSEMIDE 20 MG PO TABS: 100.0000 mg | ORAL_TABLET | Freq: Two times a day (BID) | ORAL | Status: DC

## 2017-12-27 MED ORDER — HEPARIN (PORCINE) IN NACL 100-0.45 UNIT/ML-% IJ SOLN
1150.0000 [IU]/h | INTRAMUSCULAR | Status: DC
  Administered 2017-12-27: 1000 [IU]/h via INTRAVENOUS
  Administered 2017-12-28: 1100 [IU]/h via INTRAVENOUS
  Filled 2017-12-27 (×2): qty 250

## 2017-12-27 MED ORDER — WARFARIN SODIUM 5 MG PO TABS
5.0000 mg | ORAL_TABLET | Freq: Once | ORAL | Status: AC
  Administered 2017-12-27: 5 mg via ORAL
  Filled 2017-12-27: qty 1

## 2017-12-27 MED ORDER — POTASSIUM CHLORIDE CRYS ER 20 MEQ PO TBCR
40.0000 meq | EXTENDED_RELEASE_TABLET | ORAL | Status: AC
  Administered 2017-12-27 (×3): 40 meq via ORAL
  Filled 2017-12-27 (×3): qty 2

## 2017-12-27 NOTE — Progress Notes (Addendum)
Advanced Heart Failure Rounding Note  PCP-Cardiologist: [name], MD   Subjective:    Heparin/coumadin on hold. Cr 1.24 K 3.2  Feeling good this am. Appetite improved. No bleeding since Tuesday afternoon. He would very much like to go home.   Hgb 7.0 -> 8.8 s/p 2 uPRBCs (Total of 7 this admit)   Objective:   Weight Range: 97.2 kg Body mass index is 27.51 kg/m.   Vital Signs:   Temp:  [98.1 F (36.7 C)-99.9 F (37.7 C)] 98.3 F (36.8 C) (09/26 0800) Pulse Rate:  [56-80] 70 (09/26 0600) Resp:  [15-19] 17 (09/25 2158) BP: (91-106)/(50-58) 95/50 (09/26 0600) SpO2:  [94 %-100 %] 97 % (09/26 0600) Weight:  [97.2 kg] 97.2 kg (09/26 0337) Last BM Date: (12/25/17)  Weight change: Filed Weights   12/25/17 0458 12/26/17 0308 12/27/17 0337  Weight: 99.5 kg 93 kg 97.2 kg    Intake/Output:   Intake/Output Summary (Last 24 hours) at 12/27/2017 0859 Last data filed at 12/27/2017 0446 Gross per 24 hour  Intake 1527.83 ml  Output 2500 ml  Net -972.17 ml      Physical Exam   General: Pale. NAD.  HEENT: Normal Neck: Supple. JVP 6-7 cm. Carotids 2+ bilat; no bruits. No thyromegaly or nodule noted. Cor: PMI nondisplaced. Irregularly irregular. Mechanical S1. 2/6 HSM  Lungs: CTAB, normal effort. Abdomen: Soft, non-tender, non-distended, no HSM. No bruits or masses. +BS  Extremities: No cyanosis, clubbing, or rash. Trace to 1+ ankle edema.  Neuro: Alert & orientedx3, cranial nerves grossly intact. moves all 4 extremities w/o difficulty. Affect pleasant   Telemetry   Afib 60-70s, personally reviewed.   EKG    No new tracings.    Labs    CBC Recent Labs    12/26/17 0500 12/27/17 0440  WBC 5.9 7.5  HGB 7.0* 8.8*  HCT 22.7* 26.9*  MCV 99.1 94.1  PLT 124* 333*   Basic Metabolic Panel Recent Labs    12/25/17 0716 12/26/17 0500 12/27/17 0440  NA  --  132* 131*  K  --  3.1* 3.2*  CL  --  95* 94*  CO2  --  27 29  GLUCOSE  --  102* 99  BUN  --  21  18  CREATININE  --  1.44* 1.24  CALCIUM  --  7.9* 8.0*  MG 1.8 1.7  --    Liver Function Tests No results for input(s): AST, ALT, ALKPHOS, BILITOT, PROT, ALBUMIN in the last 72 hours. No results for input(s): LIPASE, AMYLASE in the last 72 hours. Cardiac Enzymes Recent Labs    12/24/17 0904 12/24/17 1809 12/24/17 2228  TROPONINI 0.03* <0.03 0.03*    BNP: BNP (last 3 results) Recent Labs    08/01/17 1804 10/18/17 1510  BNP 784.4* 1,073.3*    ProBNP (last 3 results) Recent Labs    03/30/17 1147 05/25/17 1044 07/10/17 1523  PROBNP 1,858* 2,749* 411.0*     D-Dimer No results for input(s): DDIMER in the last 72 hours. Hemoglobin A1C No results for input(s): HGBA1C in the last 72 hours. Fasting Lipid Panel No results for input(s): CHOL, HDL, LDLCALC, TRIG, CHOLHDL, LDLDIRECT in the last 72 hours. Thyroid Function Tests No results for input(s): TSH, T4TOTAL, T3FREE, THYROIDAB in the last 72 hours.  Invalid input(s): FREET3  Other results:   Imaging    No results found.   Medications:     Scheduled Medications: . allopurinol  100 mg Oral Daily  .  aminocaproic acid  5 mL Oral Q1H  . amiodarone  200 mg Oral Daily  . docusate sodium  100 mg Oral BID  . ezetimibe-simvastatin  1 tablet Oral QHS  . Gerhardt's butt cream   Topical BID  . insulin aspart  0-9 Units Subcutaneous TID WC  . polyethylene glycol  17 g Oral BID  . potassium chloride  40 mEq Oral Q4H  . torsemide  50 mg Oral BID  . Warfarin - Pharmacist Dosing Inpatient   Does not apply q1800    Infusions:   PRN Medications: acetaminophen **OR** [DISCONTINUED] acetaminophen, HYDROcodone-acetaminophen, levalbuterol, magnesium citrate, ondansetron **OR** ondansetron (ZOFRAN) IV, senna-docusate, sorbitol, traZODone    Patient Profile   [name] is an 82 year old with history of  mechanical AVR 1994, OSA on CPAP, permanent atrial fibrillation on chronic coumadin therapy, chronic diastolic heart  failure, CKD III,DM,CAD s/p CAGB 1994, COPDand severe mitral regurgitation.   Admitted with symptomatic anemia. Hgb on admit 4.9   Assessment/Plan   1. Anemia due to acute blood loss from previous oral surgery on 12/12/2017. - Hgb on admit 4.9. INR was supra therapuetic . - Overall he has received 7 uPRBCs.  - Hgb up to 8.8. - Have called Dr Enrique Sack.  - OK to use amicar prn.  2. AKI on CKD Stage III - Baseline creatinine 1.5. - Down to 1.24 with fluid resuscitation. - Cr back to baseline with transfusion and torsemide reduction.  - Continue to follow daily BMET   3. Chronic Diastolic Heart Failure, RV failure - ECHO 10/2017 EF 55-60% RV moderately reduced. RA severely dilated.  - Volume status trending up.  - Increase torsemide back to home dose of 100 mg BID.  - Cr 1.77 -> 1.44 -> 1.24  4. Chronic A fib  - Rate controlled.  - Off coumadin. INR 1.36. All Anticoag on hold. - Heparin drip on hold with bleeding.   5. Hypokalemia - K 3.2. Supp ordered.   6. 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 Structural Heart Team evaluated. Not a candidate for clip.  - He has beenseen by Dr. [name] who recommended aggressive medical therapy versus possible experimental percutaneous MVR as he felt very high risk for conventional surgery.  - No change.    7. Mechanical Aortic Valve - Holding AC for now with significant bleeding and anemia.  - Bleeding stopped. Resume coumadin tonight. Will discuss need for bridge with MD.  - INR goal 2.5-3.5.   8. Thrombocytopenia, chronic  - Platelets on admit 221 but this is an outlier. - Platelets 117.    9. Hyponatremia  - Na 131. Follow.   Medication concerns reviewed with patient and pharmacy team. Barriers identified: None.  Length of Stay: 4  Annamaria Helling  12/27/2017, 8:59 AM  Advanced Heart Failure Team Pager [phone] (M-F; 7a - 4p)  Please contact [facility] for night-coverage  after hours (4p -7a ) and weekends on [email or web address]  Patient seen with PA, agree with the above note.   Bleeding from his mouth has stopped.  No complaints this morning, no dyspnea.  Walked in hall.    On exam, JVP 8 cm.  No edema.  Irregular S1S2 with mechanical S2, 2/6 HSM apex.   1. Anemia: Blood loss from oral surgery.  Bleeding in mouth appears to have stopped today.  Hgb with appropriate rise after transfusions.  - Restarted warfarin yesterday, restart heparin gtt today.  If no further  mouth bleeding on heparin, could consider home with Lovenox bridge tomorrow.  2. Chronic diastolic CHF with prominent RV failure: Echomost recentlyshowed EF 55-65%, mechanical aortic valve functioning normally, mild mitral stenosis/severe mitral regurgitation, RV moderately dilated with moderately decreased systolic function, PASP 49 mmHg. Severe MRlikelycontributing to the significant RV failure. Volume status looks good.  Torsemide was cut back to 50 mg bid at admission with elevated creatinine. Creatinine now back down to 1.24.  - Increase torsemide to 80 mg bid.   - Replace K. 3. Mechanical aortic valve:Appeared to function well onlast echo.  -Restartedwarfarin yesterday with INR goal 2.5-3.5 (also with atrial fibrillation).  - Add back heparin gtt today.  If no further breathing, possibly Lovenox bridge tomorrow and home.  4. Atrial fibrillation: Chronic. Rate controlled 70s.No change. 5. CAD: s/p CABG.No CP. - Continue Vytorin.  6. COPD:On home oxygen. No change. 7. OSA: Continue home CPAP.No change. 8. Acute on CKD: Stage 3. Creatinineback near baseline 1.24. 9. Thrombocytopenia: This appears chronic, had before this admission. No change 10. 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 Structural Heart team evaluated. It does not appear that he will be a Mitraclip  candidate. He wasseen by Dr. [name] who recommended aggressive medical therapy versus possible experimental percutaneous MVR as he felt very high risk for conventional surgery. Now that he has lost almost > 90 lbs with diuresis over several months, will reassess surgical risk with Dr. [name].  11. VT: Last hospital admission, patient had long run of VT.  BP felt too low for Coreg.  - Continue amiodarone 200 mg daily, eventually decrease to 100 mg daily.   Loralie Champagne 12/27/2017 4:22 PM

## 2017-12-27 NOTE — Progress Notes (Signed)
[facility] TEAM 1 - Stepdown/ICU TEAM  [name]  WRU:[id number] DOB: [birth year] DOA: 12/23/2017 PCP: Cassandria Anger, MD    Brief Narrative:  82 y.o. M w/ a hx of mechanical aortic valve (1994), OSA on CPAP, permanent A. fib, chronic anticoagulant therapy with Coumadin, chronic combined CHF, CKD stage III, DM, CAD with CABG 1994, COPD, severe mitral regurgitation, carotid stenosis with left carotid enterotomy, HTN, and HLD who presented to ED with generalized weakness and headache.  He reported a tooth extraction on 9/11 after which his bleeding had not stopped. He continued to take his warfarin.  In the ED he was found to have a BP of 88/51, a Hgb of 4.9, and an INR of 4.32.  Significant Events: 9/22 admit   Subjective: -Is any complaints overnight, asking if his diet can be advanced, no further bleeding from his mouth   Assessment & Plan:  Acute blood loss anemia - bleeding after dental surgery - ,patient had 10 teeth  extraction by Dr. Dorothyann Gibbs for dental infection, supportive course was complicated with significant bleed, when he required some transfusions as well, and Amicar rinses. -Acquired 6 units transfusion so far during this admission, hemoglobin is stable at 8.1 . -Continue with Amicar . -Anticoagulation management per cardiology, heparin was on hold for last 48 hours given significant mouth bleed, and is back on warfarin, heparin GTT was resumed today .  Recent Labs  Lab 12/24/17 1809 12/25/17 0507 12/25/17 1406 12/26/17 0500 12/27/17 0440  HGB 7.4* 7.4* 7.7* 7.0* 8.8*   Lab Results  Component Value Date   INR 1.36 12/27/2017   INR 1.43 12/26/2017   INR 1.79 12/25/2017    Supratherapeutic INR  - chronically on Coumadin due to mechanical aortic valve + A. Fib , was elevated on admission, warfarin has been on hold for last few days, only resumed yesterday, this morning INR is 1.3.  Chronic hypotension  - chronic issue currently asymptomatic monitor.  On  moderate to high dose of diuretic at baseline.  Acute renal insufficiency  -Due to to volume depletion from anemia, resolved with transfusion  Hyponatremia  -Improving with initiation of torsemide, CHF team following .  Hypokalemia  -Repleted  HLD -continue home medication no acute issue.  On combination of statin along with ezetimibe.   Chronic atrial fibrillation [place] vas 2 score of at least 4.  Continue combination of amiodarone and anticoagulation as above again cardiology team has been reconsulted.   Chronic diastolic CHF w/ prominent RV failure  -   Last TTE 10/23/2017, EF 55-60%, sever MR, mechanical AV - followed by CHF Team, have been reconsulted, continue oral diuretic for now.  DM2 - CBG well controlled on diet control.  Coronary artery disease with history of myocardial infarction s/p CABG - Transfuse as needed to keep Hgb close to 7.5 - 8 .    DVT prophylaxis: IV heparin  Code Status: FULL CODE Family Communication: no family present at time of exam  Disposition Plan: Tele  Consultants: Cards  Antimicrobials: none   Objective: Blood pressure 99/67, pulse 84, temperature 97.6 F (36.4 C), temperature source Oral, resp. rate 16, height 6\' 2"  (1.88 m), weight 97.2 kg, SpO2 98 %.  Intake/Output Summary (Last 24 hours) at 12/27/2017 1448 Last data filed at 12/27/2017 1140 Gross per 24 hour  Intake 856 ml  Output 2600 ml  Net -1744 ml   Filed Weights   12/25/17 0458 12/26/17 0308 12/27/17 0337  Weight: 99.5 kg 93 kg  97.2 kg    Examination:  Awake Alert, Oriented X 3, No new F.N deficits, Normal affect Symmetrical Chest wall movement, Good air movement bilaterally, CTAB Irregular irregular, mechanical S1, murmur present . +ve B.Sounds, Abd Soft, No tenderness, No rebound - guarding or rigidity. No Cyanosis, Clubbing, has ankle edema, No new Rash or bruise      CBC:  Recent Labs  Lab 12/23/17 1259  12/25/17 0507 12/25/17 1406 12/26/17 0500  12/27/17 0440  WBC 9.4   < > 5.0  --  5.9 7.5  NEUTROABS 6.6  --   --   --   --   --   HGB 4.9*   < > 7.4* 7.7* 7.0* 8.8*  HCT 16.3*   < > 22.7* 24.4* 22.7* 26.9*  MCV 108.7*   < > 95.0  --  99.1 94.1  PLT 221   < > 134*  --  124* 117*   < > = values in this interval not displayed.   Basic Metabolic Panel: Recent Labs  Lab 12/25/17 0507 12/25/17 0716 12/26/17 0500 12/27/17 0440  NA 131*  --  132* 131*  K 2.4*  --  3.1* 3.2*  CL 90*  --  95* 94*  CO2 29  --  27 29  GLUCOSE 104*  --  102* 99  BUN 30*  --  21 18  CREATININE 1.77*  --  1.44* 1.24  CALCIUM 8.2*  --  7.9* 8.0*  MG  --  1.8 1.7  --    GFR: Estimated Creatinine Clearance: 55.2 mL/min (by C-G formula based on SCr of 1.24 mg/dL).  Liver Function Tests: Recent Labs  Lab 12/23/17 1259  AST 24  ALT 8  ALKPHOS 77  BILITOT 1.2  PROT 5.8*  ALBUMIN 2.8*    Coagulation Profile: Recent Labs  Lab 12/23/17 1259 12/24/17 0904 12/25/17 0507 12/26/17 0500 12/27/17 0440  INR 4.32* 2.16 1.79 1.43 1.36    Cardiac Enzymes: Recent Labs  Lab 12/24/17 0904 12/24/17 1809 12/24/17 2228  TROPONINI 0.03* <0.03 0.03*     CBG: Recent Labs  Lab 12/26/17 1157 12/26/17 1714 12/26/17 2221 12/27/17 0803 12/27/17 1231  GLUCAP 135* 85 142* 98 104*    Recent Results (from the past 240 hour(s))  MRSA PCR Screening     Status: None   Collection Time: 12/23/17  6:49 PM  Result Value Ref Range Status   MRSA by PCR NEGATIVE NEGATIVE Final    Comment:        The GeneXpert MRSA Assay (FDA approved for NASAL specimens only), is one component of a comprehensive MRSA colonization surveillance program. It is not intended to diagnose MRSA infection nor to guide or monitor treatment for MRSA infections. Performed at [facility], [place] [address] [place], [place] [address]      Scheduled Meds: . allopurinol  100 mg Oral Daily  . amiodarone  200 mg Oral Daily  . docusate sodium  100 mg Oral BID  .  ezetimibe-simvastatin  1 tablet Oral QHS  . Gerhardt's butt cream   Topical BID  . insulin aspart  0-9 Units Subcutaneous TID WC  . polyethylene glycol  17 g Oral BID  . potassium chloride  40 mEq Oral Q4H  . torsemide  100 mg Oral BID  . warfarin  5 mg Oral ONCE-1800  . Warfarin - Pharmacist Dosing Inpatient   Does not apply q1800   Continuous Infusions: . heparin 1,000 Units/hr (12/27/17 1138)  LOS: 4 days   Signature  Phillips Climes M.D on 12/27/2017 at 2:48 PM  To page go to [email or web address] - password [facility]

## 2017-12-27 NOTE — Progress Notes (Signed)
ANTICOAGULATION CONSULT NOTE - Follow-Up Consult  Pharmacy Consult for warfarin / heparin  Indication: atrial fibrillation and mechanical valve  Allergies  Allergen Reactions  . Rifampin Rash    May have been caused by Vancomycin or Rifampin (??)  . Vancomycin Rash    May have been caused by Vancomycin or Rifampin (??)    Patient Measurements: Height: 6\' 2"  (188 cm) Weight: 214 lb 4.6 oz (97.2 kg) IBW/kg (Calculated) : 82.2  Heparin dosing weight 100.8 kg  Vital Signs: Temp: 98.4 F (36.9 C) (09/26 1928) Temp Source: Oral (09/26 1928) BP: 87/43 (09/26 1930) Pulse Rate: 75 (09/26 1928)  Labs: Recent Labs    12/24/17 2228  12/25/17 0507 12/25/17 1406 12/25/17 1548 12/26/17 0500 12/27/17 0440 12/27/17 1940  HGB  --    < > 7.4* 7.7*  --  7.0* 8.8*  --   HCT  --    < > 22.7* 24.4*  --  22.7* 26.9*  --   PLT  --   --  134*  --   --  124* 117*  --   LABPROT  --   --  20.6*  --   --  17.3* 16.6*  --   INR  --   --  1.79  --   --  1.43 1.36  --   HEPARINUNFRC  --   --  0.56  --  0.20*  --   --  0.24*  CREATININE  --   --  1.77*  --   --  1.44* 1.24  --   TROPONINI 0.03*  --   --   --   --   --   --   --    < > = values in this interval not displayed.    Estimated Creatinine Clearance: 55.2 mL/min (by C-G formula based on SCr of 1.24 mg/dL).   Medical History: Past Medical History:  Diagnosis Date  . Carotid artery disease ([place]) 1994   s/p left carotid endarerectomy   . Chronic atrial fibrillation (HCC)    a. on coumadin   . Chronic diastolic CHF (congestive heart failure) ([place])   . Diabetes mellitus without complication ([place])    dx 2016  . Dyspnea   . Heart murmur   . Hx of CABG    a. 1994  . Hypertension   . OSA (obstructive sleep apnea)   . Rheumatic fever   . S/P AVR (aortic valve replacement)    a. mechanical valve 1996  . Subclavian bypass stenosis ([place])   . Temporary low platelet count (HCC)    chronic problem since receiving aortic valve  replacement  . Vitamin B12 deficiency     Assessment: 82 yo M presented with weakness and bleeding from his mouth. Pt is on chronic warfarin for history of afib and mechanical AVR. INR was 4.32 on admission, now subtherapeutic after holding and reversal with vitamin K. Pt was initially bridged with IV heparin but given ongoing bleeding and low Hgb this was stopped 9/24.   Pt has now been restarted on warfarin. INR subtherapeuticas expected. Hgb up to 8.8 from 7 s/p 2 units pRBCs, pt denies bleeding. Per MD, resume IV heparin 1000 uts/hr HL 0.24 < goal.    *PTA Warfarin Dose = 7.5mg  Mon/Wed/Fri, 5mg  all other days  Goal of Therapy:  Heparin level 0.3-0.5 INR 2.5-3.5 Monitor platelets by anticoagulation protocol: Yes   Plan:  -Increase Heparin 1100 units/h  -Warfarin 5mg  PO x1 tonight -Daily INR, CBC  Bonnita Nasuti Pharm.D. CPP, BCPS Clinical Pharmacist [phone] 12/27/2017 9:29 PM

## 2017-12-27 NOTE — Progress Notes (Signed)
ANTICOAGULATION CONSULT NOTE - [place] for warfarin Indication: atrial fibrillation and mechanical valve  Allergies  Allergen Reactions  . Rifampin Rash    May have been caused by Vancomycin or Rifampin (??)  . Vancomycin Rash    May have been caused by Vancomycin or Rifampin (??)    Patient Measurements: Height: 6\' 2"  (188 cm) Weight: 214 lb 4.6 oz (97.2 kg) IBW/kg (Calculated) : 82.2  Heparin dosing weight 100.8 kg  Vital Signs: Temp: 98.3 F (36.8 C) (09/26 0800) Temp Source: Oral (09/26 0800) BP: 95/50 (09/26 0600) Pulse Rate: 70 (09/26 0600)  Labs: Recent Labs    12/24/17 0904 12/24/17 1809 12/24/17 2228 12/25/17 0507 12/25/17 1406 12/25/17 1548 12/26/17 0500 12/27/17 0440  HGB 7.9* 7.4*  --  7.4* 7.7*  --  7.0* 8.8*  HCT 23.6* 22.7*  --  22.7* 24.4*  --  22.7* 26.9*  PLT 126* 131*  --  134*  --   --  124* 117*  APTT 38*  --   --   --   --   --   --   --   LABPROT 23.9*  --   --  20.6*  --   --  17.3* 16.6*  INR 2.16  --   --  1.79  --   --  1.43 1.36  HEPARINUNFRC  --  0.37  --  0.56  --  0.20*  --   --   CREATININE 2.17*  --   --  1.77*  --   --  1.44* 1.24  TROPONINI 0.03* <0.03 0.03*  --   --   --   --   --     Estimated Creatinine Clearance: 55.2 mL/min (by C-G formula based on SCr of 1.24 mg/dL).   Medical History: Past Medical History:  Diagnosis Date  . Carotid artery disease ([place]) 1994   s/p left carotid endarerectomy   . Chronic atrial fibrillation (HCC)    a. on coumadin   . Chronic diastolic CHF (congestive heart failure) ([place])   . Diabetes mellitus without complication ([place])    dx 2016  . Dyspnea   . Heart murmur   . Hx of CABG    a. 1994  . Hypertension   . OSA (obstructive sleep apnea)   . Rheumatic fever   . S/P AVR (aortic valve replacement)    a. mechanical valve 1996  . Subclavian bypass stenosis ([place])   . Temporary low platelet count (HCC)    chronic problem since receiving aortic valve  replacement  . Vitamin B12 deficiency     Assessment: 82 yo M presented with weakness and bleeding from his mouth. Pt is on chronic warfarin for history of afib and mechanical AVR. INR was 4.32 on admission, now subtherapeutic after holding and reversal with vitamin K. Pt was initially bridged with IV heparin but given ongoing bleeding and low Hgb this was stopped 9/24.   Pt has now been restarted on warfarin. INR subtherapeuticas expected. Hgb up to 8.8 from 7 s/p 2 units pRBCs, pt denies bleeding. Per MD, resume IV heparin with no bolus and target a low goal.  *PTA Warfarin Dose = 7.5mg  Mon/Wed/Fri, 5mg  all other days  Goal of Therapy:  Heparin level 0.3-0.5 INR 2.5-3.5 Monitor platelets by anticoagulation protocol: Yes   Plan:  -Heparin 1000 units/h - check 8h level -Warfarin 5mg  PO x1 tonight -Daily INR, CBC  [name], PharmD, BCPS Clinical Pharmacist [phone] Please check AMION  for all [facility] Pharmacy numbers 12/27/2017

## 2017-12-27 NOTE — Progress Notes (Signed)
Physical Therapy Treatment Patient Details Name: [name] MRN: [id number] DOB: 1935/05/08 Today's Date: 12/27/2017    History of Present Illness 82yo male presenting to the ED with generalized weakness, HA, feelings of passing out. He had a tooth extracted 1-2 days ago and his bleeding has not stopped. Diagnosed with severe anemia, supratherapetic INR, and hypotension. PMH mechanical aoritic valve placement, A-fib on coumadin, CHF, CKD, DM, COPD, mitral regurg, HTN     PT Comments    Patient received in chair - motivated to work with PT this morning. Patient requiring general min guard assist for safety but without need for physical assist for mobility.  Making good progress towards goals with ability to progress mobility to hallway level ambulation today. Will continue to follow.    Follow Up Recommendations  Home health PT     Equipment Recommendations  None recommended by PT    Recommendations for Other Services       Precautions / Restrictions Precautions Precautions: Fall Restrictions Weight Bearing Restrictions: No    Mobility  Bed Mobility               General bed mobility comments: up in recliner  Transfers Overall transfer level: Needs assistance Equipment used: Rolling walker (2 wheeled) Transfers: Sit to/from Stand Sit to Stand: Min guard         General transfer comment: for safety and immediate standing balance - increased effort from patient  Ambulation/Gait Ambulation/Gait assistance: Min guard Gait Distance (Feet): 120 Feet Assistive device: Rolling walker (2 wheeled) Gait Pattern/deviations: Step-through pattern;Decreased stride length;Trunk flexed Gait velocity: decreased   General Gait Details: patient reporting slight R knee soreness from prolonged rest; patient able to verbalize need to return to room due to fatigue   Stairs             Wheelchair Mobility    Modified Rankin (Stroke Patients Only)       Balance  Overall balance assessment: Needs assistance Sitting-balance support: No upper extremity supported;Feet supported Sitting balance-Leahy Scale: Good     Standing balance support: During functional activity;Bilateral upper extremity supported Standing balance-Leahy Scale: Fair Standing balance comment: UE support                            Cognition Arousal/Alertness: Awake/alert Behavior During Therapy: WFL for tasks assessed/performed Overall Cognitive Status: Within Functional Limits for tasks assessed                                        Exercises      General Comments        Pertinent Vitals/Pain Pain Assessment: No/denies pain    Home Living                      Prior Function            PT Goals (current goals can now be found in the care plan section) Acute Rehab PT Goals Patient Stated Goal: to go home, get stronger  PT Goal Formulation: With patient Time For Goal Achievement: 01/07/18 Potential to Achieve Goals: Good Progress towards PT goals: Progressing toward goals    Frequency    Min 3X/week      PT Plan Current plan remains appropriate    Co-evaluation  AM-PAC PT "6 Clicks" Daily Activity  Outcome Measure  Difficulty turning over in bed (including adjusting bedclothes, sheets and blankets)?: A Little Difficulty moving from lying on back to sitting on the side of the bed? : A Little Difficulty sitting down on and standing up from a chair with arms (e.g., wheelchair, bedside commode, etc,.)?: Unable Help needed moving to and from a bed to chair (including a wheelchair)?: A Little Help needed walking in hospital room?: A Little Help needed climbing 3-5 steps with a railing? : A Little 6 Click Score: 16    End of Session Equipment Utilized During Treatment: Oxygen;Gait belt Activity Tolerance: Patient tolerated treatment well Patient left: in chair;with chair alarm set;with call  bell/phone within reach;with nursing/sitter in room Nurse Communication: Mobility status PT Visit Diagnosis: Muscle weakness (generalized) (M62.81);History of falling (Z91.81)     Time: 1031-2811 PT Time Calculation (min) (ACUTE ONLY): 16 min  Charges:  $Gait Training: 8-22 mins                     Lanney Gins, PT, DPT Supplemental Physical Therapist 12/27/17 11:40 AM Pager: [phone] Office: [phone]

## 2017-12-27 NOTE — Plan of Care (Signed)
POC reviewed with patient. No acute changes throughout shift. RN to report off to oncoming Rn.

## 2017-12-28 DIAGNOSIS — I481 Persistent atrial fibrillation: Secondary | ICD-10-CM

## 2017-12-28 LAB — BASIC METABOLIC PANEL
Anion gap: 8 (ref 5–15)
BUN: 20 mg/dL (ref 8–23)
CO2: 28 mmol/L (ref 22–32)
Calcium: 7.9 mg/dL — ABNORMAL LOW (ref 8.9–10.3)
Chloride: 94 mmol/L — ABNORMAL LOW (ref 98–111)
Creatinine, Ser: 1.29 mg/dL — ABNORMAL HIGH (ref 0.61–1.24)
GFR calc Af Amer: 59 mL/min — ABNORMAL LOW (ref >60)
GFR calc non Af Amer: 51 mL/min — ABNORMAL LOW (ref >60)
GLUCOSE: 101 mg/dL — AB (ref 70–99)
POTASSIUM: 3.6 mmol/L (ref 3.5–5.1)
Sodium: 130 mmol/L — ABNORMAL LOW (ref 135–145)

## 2017-12-28 LAB — CBC
HEMATOCRIT: 27.7 % — AB (ref 39.0–52.0)
Hemoglobin: 8.7 g/dL — ABNORMAL LOW (ref 13.0–17.0)
MCH: 30 pg (ref 26.0–34.0)
MCHC: 31.4 g/dL (ref 30.0–36.0)
MCV: 95.5 fL (ref 78.0–100.0)
Platelets: 115 10*3/uL — ABNORMAL LOW (ref 150–400)
RBC: 2.9 MIL/uL — ABNORMAL LOW (ref 4.22–5.81)
RDW: 19.9 % — AB (ref 11.5–15.5)
WBC: 5.9 10*3/uL (ref 4.0–10.5)

## 2017-12-28 LAB — GLUCOSE, CAPILLARY
GLUCOSE-CAPILLARY: 140 mg/dL — AB (ref 70–99)
GLUCOSE-CAPILLARY: 121 mg/dL — AB (ref 70–99)
Glucose-Capillary: 101 mg/dL — ABNORMAL HIGH (ref 70–99)

## 2017-12-28 LAB — PROTIME-INR
INR: 1.43
Prothrombin Time: 17.3 seconds — ABNORMAL HIGH (ref 11.4–15.2)

## 2017-12-28 LAB — HEPARIN LEVEL (UNFRACTIONATED): Heparin Unfractionated: 0.3 IU/mL (ref 0.30–0.70)

## 2017-12-28 MED ORDER — WARFARIN SODIUM 5 MG PO TABS: 5.0000 mg | ORAL_TABLET | Freq: Once | ORAL | Status: DC

## 2017-12-28 MED ORDER — AMIODARONE HCL 200 MG PO TABS: 200.0000 mg | ORAL_TABLET | Freq: Every day | ORAL | 3 refills | Status: DC

## 2017-12-28 MED ORDER — TORSEMIDE 20 MG PO TABS: 100.0000 mg | ORAL_TABLET | Freq: Two times a day (BID) | ORAL | Status: DC

## 2017-12-28 MED ORDER — METOLAZONE 2.5 MG PO TABS: 2.5000 mg | ORAL_TABLET | ORAL | 0 refills | Status: DC

## 2017-12-28 MED ORDER — ENOXAPARIN SODIUM 100 MG/ML ~~LOC~~ SOLN: 100.0000 mg | Freq: Two times a day (BID) | SUBCUTANEOUS | Status: DC

## 2017-12-28 MED ORDER — ENOXAPARIN SODIUM 100 MG/ML ~~LOC~~ SOLN
100.0000 mg | Freq: Two times a day (BID) | SUBCUTANEOUS | Status: DC
  Administered 2017-12-28: 100 mg via SUBCUTANEOUS
  Filled 2017-12-28: qty 1

## 2017-12-28 MED ORDER — POTASSIUM CHLORIDE ER 20 MEQ PO TBCR: 40.0000 meq | EXTENDED_RELEASE_TABLET | Freq: Every day | ORAL | 0 refills | Status: DC

## 2017-12-28 MED ORDER — POTASSIUM CHLORIDE CRYS ER 20 MEQ PO TBCR
40.0000 meq | EXTENDED_RELEASE_TABLET | Freq: Once | ORAL | Status: AC
  Administered 2017-12-28: 40 meq via ORAL
  Filled 2017-12-28: qty 2

## 2017-12-28 MED ORDER — WARFARIN SODIUM 5 MG PO TABS: 5.0000 mg | ORAL_TABLET | Freq: Every day | ORAL | 0 refills | Status: DC

## 2017-12-28 NOTE — Care Management Note (Signed)
Case Management Note  Patient Details  Name: [name] MRN: [id number] Date of Birth: January 19, 1936  Subjective/Objective:        Presents with severe anemia. Recently admitted 12/12/2017 s/p dental extraction for chronic apical periodontitis.Hospital course complicated by postop anemia, V. tach, hypokalemia, volume overload. Discharged on 12/14/2017.  Hx of  mechanical aortic valve 1994, OSA on CPAP, permanent A. Fib/ Coumadin,  CHF, CKD stage III, diabetes mellitus, coronary artery/CABG 1994, COPD, severe mitral regurgitation, carotid stenosis with left carotid enterotomy,HTN, HLD.  PCP: Dr. Purnell Shoemaker   Action/Plan: Transition to home today with the resumption of home health services. NCM made [facility] liaison aware of d/c plan..... Home health RN need to draw INR on Monday 9/30 and send INR drawn  to coumadin clinic.   Expected Discharge Date:  12/26/17               Expected Discharge Plan:  [place]  In-House Referral:     Discharge planning Services  CM Consult  Post Acute Care Choice:  Resumption of Svcs/PTA Provider, Home Health Choice offered to:  Patient  DME Arranged:    N/A DME Agency:   N/A  HH Arranged:  RN, Disease Management, PT [place] Agency:  [place]  Status of Service:  Completed, signed off  If discussed at [place] of Stay Meetings, dates discussed:    Additional Comments:  Sharin Mons, RN 12/28/2017, 1:55 PM

## 2017-12-28 NOTE — Consult Note (Addendum)
   [facility] CM Inpatient Consult   12/28/2017  [name] [birth year] [id number]    [facility] Care Management follow up. Attempting to engage patient for [place] Management services due to unplanned readmission risk score of 26% (high).  Went to bedside to speak with [name] about [place] Management services. He pleasantly declined [facility] Care Management follow up. States " I had you guys in the past and I do not think it was helpful. The service was more burdensome with the calls and not necessary. I was asked the same questions on every call about my weight. I just rather not this time". Accepted [place] Management brochure with contact information to call should he change his mind.  Made inpatient RNCM aware [place] Management services were declined.    Marthenia Rolling, MSN-Ed, RN,BSN [facility] Liaison [phone]

## 2017-12-28 NOTE — Discharge Instructions (Signed)
Follow with Primary MD [name], [name], MD in 7 days   Get CBC, CMP,  checked  by Primary MD next visit.  - Please have your INR checked by Monday.  Activity: As tolerated with Full fall precautions use walker/cane & assistance as needed   Disposition Home    Diet: Heart Healthy dysphagia 2 diet, with feeding assistance and aspiration precautions.  For Heart failure patients - Check your Weight same time everyday, if you gain over 2 pounds, or you develop in leg swelling, experience more shortness of breath or chest pain, call your Primary MD immediately. Follow Cardiac Low Salt Diet and 1.5 lit/day fluid restriction.   On your next visit with your primary care physician please Get Medicines reviewed and adjusted.   Please request your Prim.MD to go over all Hospital Tests and Procedure/Radiological results at the follow up, please get all Hospital records sent to your Prim MD by signing hospital release before you go home.   If you experience worsening of your admission symptoms, develop shortness of breath, life threatening emergency, suicidal or homicidal thoughts you must seek medical attention immediately by calling 911 or calling your MD immediately  if symptoms less severe.  You Must read complete instructions/literature along with all the possible adverse reactions/side effects for all the Medicines you take and that have been prescribed to you. Take any new Medicines after you have completely understood and accpet all the possible adverse reactions/side effects.   Do not drive, operating heavy machinery, perform activities at heights, swimming or participation in water activities or provide baby sitting services if your were admitted for syncope or siezures until you have seen by Primary MD or a Neurologist and advised to do so again.  Do not drive when taking Pain medications.    Do not take more than prescribed Pain, Sleep and Anxiety Medications  Special  Instructions: If you have smoked or chewed Tobacco  in the last 2 yrs please stop smoking, stop any regular Alcohol  and or any Recreational drug use.  Wear Seat belts while driving.   Please note  You were cared for by a hospitalist during your hospital stay. If you have any questions about your discharge medications or the care you received while you were in the hospital after you are discharged, you can call the unit and asked to speak with the hospitalist on call if the hospitalist that took care of you is not available. Once you are discharged, your primary care physician will handle any further medical issues. Please note that NO REFILLS for any discharge medications will be authorized once you are discharged, as it is imperative that you return to your primary care physician (or establish a relationship with a primary care physician if you do not have one) for your aftercare needs so that they can reassess your need for medications and monitor your lab values.

## 2017-12-28 NOTE — Progress Notes (Signed)
Physical Therapy Treatment Patient Details Name: [name] MRN: [id number] DOB: 1935-10-09 Today's Date: 12/28/2017    History of Present Illness 82yo male presenting to the ED with generalized weakness, HA, feelings of passing out. He had a tooth extracted 1-2 days ago and his bleeding has not stopped. Diagnosed with severe anemia, supratherapetic INR, and hypotension. PMH mechanical aoritic valve placement, A-fib on coumadin, CHF, CKD, DM, COPD, mitral regurg, HTN     PT Comments    Patient is making progress toward PT goals and tolerated session well. Pt with c/o R ankle/foot pain with weight bearing limiting gait distance. Pt will continue to benefit from further skilled PT services to maximize independence and safety with mobility.    Follow Up Recommendations  Home health PT     Equipment Recommendations  None recommended by PT    Recommendations for Other Services       Precautions / Restrictions Precautions Precautions: Fall    Mobility  Bed Mobility               General bed mobility comments: up in recliner  Transfers Overall transfer level: Needs assistance Equipment used: Rolling walker (2 wheeled) Transfers: Sit to/from Stand Sit to Stand: Supervision         General transfer comment: supervision for safety; increased time and effort  Ambulation/Gait Ambulation/Gait assistance: Supervision Gait Distance (Feet): 200 Feet Assistive device: Rolling walker (2 wheeled) Gait Pattern/deviations: Step-through pattern;Decreased stride length;Trunk flexed Gait velocity: decreased   General Gait Details: cues for increased cadence and for upright posture; pt with c/o R ankle/foot pain with weight bearing    Stairs             Wheelchair Mobility    Modified Rankin (Stroke Patients Only)       Balance Overall balance assessment: Needs assistance Sitting-balance support: No upper extremity supported;Feet supported Sitting balance-Leahy  Scale: Good     Standing balance support: During functional activity;Bilateral upper extremity supported Standing balance-Leahy Scale: Fair Standing balance comment: UE support                            Cognition Arousal/Alertness: Awake/alert Behavior During Therapy: WFL for tasks assessed/performed Overall Cognitive Status: Within Functional Limits for tasks assessed                                        Exercises      General Comments        Pertinent Vitals/Pain      Home Living                      Prior Function            PT Goals (current goals can now be found in the care plan section) Progress towards PT goals: Progressing toward goals    Frequency    Min 3X/week      PT Plan Current plan remains appropriate    Co-evaluation              AM-PAC PT "6 Clicks" Daily Activity  Outcome Measure  Difficulty turning over in bed (including adjusting bedclothes, sheets and blankets)?: A Little Difficulty moving from lying on back to sitting on the side of the bed? : A Little Difficulty sitting down on and standing up from a chair with  arms (e.g., wheelchair, bedside commode, etc,.)?: Unable Help needed moving to and from a bed to chair (including a wheelchair)?: A Little Help needed walking in hospital room?: A Little Help needed climbing 3-5 steps with a railing? : A Little 6 Click Score: 16    End of Session Equipment Utilized During Treatment: Oxygen;Gait belt Activity Tolerance: Patient tolerated treatment well Patient left: in chair;with call bell/phone within reach;with family/visitor present Nurse Communication: Mobility status PT Visit Diagnosis: Muscle weakness (generalized) (M62.81);History of falling (Z91.81)     Time: 3419-6222 PT Time Calculation (min) (ACUTE ONLY): 25 min  Charges:  $Gait Training: 23-37 mins                     Earney Navy, PTA Acute Rehabilitation Services Pager:  [phone] Office: [phone]     Darliss Cheney 12/28/2017, 2:57 PM

## 2017-12-28 NOTE — Progress Notes (Addendum)
ANTICOAGULATION CONSULT NOTE - [place] for warfarin Indication: atrial fibrillation and mechanical valve  Allergies  Allergen Reactions  . Rifampin Rash    May have been caused by Vancomycin or Rifampin (??)  . Vancomycin Rash    May have been caused by Vancomycin or Rifampin (??)    Patient Measurements: Height: 6\' 2"  (188 cm) Weight: 217 lb 6 oz (98.6 kg) IBW/kg (Calculated) : 82.2  Heparin dosing weight 100.8 kg  Vital Signs: Temp: 97.5 F (36.4 C) (09/27 0405) Temp Source: Oral (09/27 0405) BP: 89/50 (09/27 0405) Pulse Rate: 80 (09/27 0405)  Labs: Recent Labs    12/25/17 1548 12/26/17 0500 12/27/17 0440 12/27/17 1940 12/28/17 0503  HGB  --  7.0* 8.8*  --  8.7*  HCT  --  22.7* 26.9*  --  27.7*  PLT  --  124* 117*  --  115*  LABPROT  --  17.3* 16.6*  --  17.3*  INR  --  1.43 1.36  --  1.43  HEPARINUNFRC 0.20*  --   --  0.24* 0.30  CREATININE  --  1.44* 1.24  --  1.29*    Estimated Creatinine Clearance: 53.1 mL/min (A) (by C-G formula based on SCr of 1.29 mg/dL (H)).   Medical History: Past Medical History:  Diagnosis Date  . Carotid artery disease ([place]) 1994   s/p left carotid endarerectomy   . Chronic atrial fibrillation ([facility])    a. on coumadin   . Chronic diastolic CHF (congestive heart failure) ([place])   . Diabetes mellitus without complication ([place])    dx 2016  . Dyspnea   . Heart murmur   . Hx of CABG    a. 1994  . Hypertension   . OSA (obstructive sleep apnea)   . Rheumatic fever   . S/P AVR (aortic valve replacement)    a. mechanical valve 1996  . Subclavian bypass stenosis ([place])   . 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 Vitamin B12 deficiency     Assessment: 82 yo M presented with weakness and bleeding from his mouth. Pt is on chronic warfarin for history of afib and mechanical AVR. INR was 4.32 on admission, now subtherapeutic after holding and  reversal with vitamin K. Pt was initially bridged with IV heparin but given ongoing bleeding and low Hgb this was stopped 9/24.   Pt has now been restarted on warfarin. INR subtherapeuticas expected but rising to 1.4. Hgb stable today, no bleeding documented.  *PTA Warfarin Dose = 7.5mg  Mon/Wed/Fri, 5mg  all other days  Goal of Therapy:  Heparin level 0.3-0.5 INR 2.5-3.5 Monitor platelets by anticoagulation protocol: Yes   Plan:  -Increase heparin slightly to 1150 units/hr -Warfarin 5mg  PO x1 tonight -Daily INR, CBC, heparin level  ADDENDUM: Pt may discharge today. MD to decide on enoxaparin bridge - if so pt has supply of 100mg  syringes at home already. Recommend discharging on warfarin 5mg  daily with INR follow-up early next week.  Arrie Senate, PharmD, BCPS Clinical Pharmacist [phone] Please check AMION for all [facility] numbers 12/28/2017

## 2017-12-28 NOTE — Progress Notes (Addendum)
Advanced Heart Failure Rounding Note  PCP-Cardiologist: [place], MD   Subjective:    Back on heparin drip + coumadin. No bleeding.   Denies SOB. Feels better. Wants to go home.     Objective:   Weight Range: 98.6 kg Body mass index is 27.91 kg/m.   Vital Signs:   Temp:  [97.5 F (36.4 C)-99.6 F (37.6 C)] 98.6 F (37 C) (09/27 0813) Pulse Rate:  [69-84] 80 (09/27 0405) Resp:  [15-20] 15 (09/27 0405) BP: (86-103)/(43-67) 89/50 (09/27 0405) SpO2:  [94 %-100 %] 97 % (09/27 0405) Weight:  [98.6 kg] 98.6 kg (09/27 0411) Last BM Date: 12/27/17  Weight change: Filed Weights   12/26/17 0308 12/27/17 0337 12/28/17 0411  Weight: 93 kg 97.2 kg 98.6 kg    Intake/Output:   Intake/Output Summary (Last 24 hours) at 12/28/2017 1250 Last data filed at 12/28/2017 1042 Gross per 24 hour  Intake 681.88 ml  Output 2400 ml  Net -1718.12 ml      Physical Exam  General:  Well appearing. No resp difficulty. In the chair.  HEENT: normal Neck: supple. JVP 5-6  Carotids 2+ bilat; no bruits. No lymphadenopathy or thryomegaly appreciated. Cor: PMI nondisplaced. Irregular rate & rhythm. No rubs, gallops or murmurs. Lungs: clear Abdomen: soft, nontender, nondistended. No hepatosplenomegaly. No bruits or masses. Good bowel sounds. Extremities: no cyanosis, clubbing, rash, R and LLE trace-1+ edema.  Neuro: alert & orientedx3, cranial nerves grossly intact. moves all 4 extremities w/o difficulty. Affect pleasant   Telemetry    afib 60-70s   EKG    No new tracings.    Labs    CBC Recent Labs    12/27/17 0440 12/28/17 0503  WBC 7.5 5.9  HGB 8.8* 8.7*  HCT 26.9* 27.7*  MCV 94.1 95.5  PLT 117* 062*   Basic Metabolic Panel Recent Labs    12/26/17 0500 12/27/17 0440 12/28/17 0503  NA 132* 131* 130*  K 3.1* 3.2* 3.6  CL 95* 94* 94*  CO2 27 29 28   GLUCOSE 102* 99 101*  BUN 21 18 20   CREATININE 1.44* 1.24 1.29*  CALCIUM 7.9* 8.0* 7.9*  MG 1.7  --   --      Liver Function Tests No results for input(s): AST, ALT, ALKPHOS, BILITOT, PROT, ALBUMIN in the last 72 hours. No results for input(s): LIPASE, AMYLASE in the last 72 hours. Cardiac Enzymes No results for input(s): CKTOTAL, CKMB, CKMBINDEX, TROPONINI in the last 72 hours.  BNP: BNP (last 3 results) Recent Labs    08/01/17 1804 10/18/17 1510  BNP 784.4* 1,073.3*    ProBNP (last 3 results) Recent Labs    03/30/17 1147 05/25/17 1044 07/10/17 1523  PROBNP 1,858* 2,749* 411.0*     D-Dimer No results for input(s): DDIMER in the last 72 hours. Hemoglobin A1C No results for input(s): HGBA1C in the last 72 hours. Fasting Lipid Panel No results for input(s): CHOL, HDL, LDLCALC, TRIG, CHOLHDL, LDLDIRECT in the last 72 hours. Thyroid Function Tests No results for input(s): TSH, T4TOTAL, T3FREE, THYROIDAB in the last 72 hours.  Invalid input(s): FREET3  Other results:   Imaging    No results found.   Medications:     Scheduled Medications: . allopurinol  100 mg Oral Daily  . amiodarone  200 mg Oral Daily  . docusate sodium  100 mg Oral BID  . ezetimibe-simvastatin  1 tablet Oral QHS  . Gerhardt's butt cream   Topical BID  .  insulin aspart  0-9 Units Subcutaneous TID WC  . torsemide  80 mg Oral BID  . warfarin  5 mg Oral ONCE-1800  . Warfarin - Pharmacist Dosing Inpatient   Does not apply q1800    Infusions: . heparin 1,150 Units/hr (12/28/17 1042)    PRN Medications: acetaminophen **OR** [DISCONTINUED] acetaminophen, HYDROcodone-acetaminophen, levalbuterol, magnesium citrate, ondansetron **OR** ondansetron (ZOFRAN) IV, senna-docusate, sorbitol, traZODone    Patient Profile   Mr Muench is an 82 year old with history of  mechanical AVR 1994, OSA on CPAP, permanent atrial fibrillation on chronic coumadin therapy, chronic diastolic heart failure, CKD III,DM,CAD s/p CAGB 1994, COPDand severe mitral regurgitation.   Admitted with symptomatic anemia. Hgb on  admit 4.9   Assessment/Plan   1. Anemia due to acute blood loss from previous oral surgery on 12/12/2017. - Hgb on admit 4.9. INR was supra therapuetic . - Overall he has received 7 uPRBCs.  - No bleeding in 72 hours on heparin and coumadin.   - OK to use amicar prn.  2. AKI on CKD Stage III - Baseline creatinine 1.5. -Resolved.   3. Chronic Diastolic Heart Failure, RV failure - ECHO 10/2017 EF 55-60% RV moderately reduced. RA severely dilated.  - Volume status stable.  - Increase torsemide back to 100 mg twice a day.  - Creatinine stable 1.3   4. Chronic A fib  - Rate controlled.  - INR 1.43.  - On Heparin + coumadin.   5. Hypokalemia -K 3.6   6. 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 Structural Heart Team evaluated. Not a candidate for clip.  - He has beenseen by Dr. [name] who recommended aggressive medical therapy versus possible experimental percutaneous MVR as he felt very high risk for conventional surgery.  - No change.    7. Mechanical Aortic Valve - Holding AC for now with significant bleeding and anemia.  - No bleeding in 72 hours. - On coumadin. INR 1.4.  - INR goal 2.5-3.5.   8. Thrombocytopenia, chronic  - Platelets on admit 221 but this is an outlier. - Platelets 115.  - Needs follow up with hematology  9. Hyponatremia  - Na 130. Follow.    Anxious to go home.He has lovenox at home and his wife is comfortable to give shots. Will discuss with [name].   Medication concerns reviewed with patient and pharmacy team. Barriers identified: None.  Length of Stay: [place], NP  12/28/2017, 12:50 PM  Advanced Heart Failure Team Pager [phone] (M-F; 7a - 4p)  Please contact [facility] for night-coverage after hours (4p -7a ) and weekends on [email or web address]  Patient seen with NP, agree with the above note.  He feels good today, no dyspnea.  No further bleeding.  Hgb stable.  INR 1.4 on warfarin and heparin gtt.   On  exam, JVP 8 cm. 1+ ankle edema. Irregular S1S2 with mechanical S2, 2/6 HSM apex.   1.Anemia: Blood loss from oral surgery. Bleeding in mouth appears to have stopped. Hgb with appropriate rise after transfusions.  - Restarted warfarin and heparin gtt.  Think he can go home today with Lovenox bridge.   2.Chronic diastolic CHF with prominent RV failure: Echomost recentlyshowed EF 55-65%, mechanical aortic valve functioning normally, mild mitral stenosis/severe mitral regurgitation, RV moderately dilated with moderately decreased systolic function, PASP 49 mmHg. Severe MRlikelycontributing to the significant RV failure. Very mild volume overload now.   - Increase torsemide to 100 mg bid,  his home dose.  - Replace K. 3. Mechanical aortic valve:Appearedto function well onlast echo.  -Restartedwarfarinwith INR goal 2.5-3.5 (also with atrial fibrillation).  -Can transition from heparin gtt to Lovenox bridge as no further bleeding. 4. Atrial fibrillation: Chronic. Rate controlled 70s.No change. 5. CAD: s/p CABG.No CP. - Continue Vytorin.  6. COPD:On home oxygen. No change. 7. OSA: Continue home CPAP.No change. 8. Acute on CKD: Stage 3. Creatinineback near baseline 1.29. 9. Thrombocytopenia: This appears chronic, had before this admission. No change 10. 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 Structural Heart team evaluated. It does not appear that he will be a Mitraclip candidate. He wasseen by Dr. [name] who recommended aggressive medical therapy versus possible experimental percutaneous MVR as he felt very high risk for conventional surgery.Now that he has lost almost > 90 lbs with diuresis over several months, will reassess surgical risk with Dr. [name]. 11. [place]:[facility] hospital admission, patient had long run of VT. BP felt too low for Coreg.  - Continue  amiodarone 200 mg daily, eventually decrease to 100 mg daily.  12. Disposition: He can go home today.  He will need followup Monday or Tuesday with coumadin clinic then CHF clinic followup.  Meds for home: Lovenox bridge to therapeutic INR on warfarin, torsemide 100 mg bid, metolazone 2.5 mg q Monday, KCl 40 daily with extra 40 on metolazone days, spironolactone 12.5 daily (resume), Vytorin, amiodarone 200 daily.   Loralie Champagne 12/28/2017 1:35 PM

## 2017-12-28 NOTE — Progress Notes (Signed)
[name] to be D/C'd Home per MD order.  Discussed prescriptions and follow up appointments with the patient. Prescriptions given to patient, medication list explained in detail. Pt verbalized understanding.  Allergies as of 12/28/2017      Reactions   Rifampin Rash   May have been caused by Vancomycin or Rifampin (??)   Vancomycin Rash   May have been caused by Vancomycin or Rifampin (??)      Medication List    TAKE these medications   allopurinol 100 MG tablet Commonly known as:  ZYLOPRIM Take 1 tablet (100 mg total) by mouth daily.   amiodarone 200 MG tablet Commonly known as:  PACERONE Take 1 tablet (200 mg total) by mouth daily. What changed:  when to take this   enoxaparin 100 MG/ML injection Commonly known as:  LOVENOX Inject 1 mL (100 mg total) into the skin every 12 (twelve) hours. Start taking on:  12/29/2017   ezetimibe-simvastatin 10-20 MG tablet Commonly known as:  VYTORIN Take 1 tablet by mouth at bedtime.   metolazone 2.5 MG tablet Commonly known as:  ZAROXOLYN Take 1 tablet (2.5 mg total) by mouth every Monday. Start taking on:  12/31/2017   Potassium Chloride ER 20 MEQ Tbcr Take 40 mEq by mouth daily. PLEASE TAKE EXTRA DOSE EVERY MONDAY   torsemide 100 MG tablet Commonly known as:  DEMADEX Take 1 tablet (100 mg total) by mouth 2 (two) times daily.   warfarin 5 MG tablet Commonly known as:  COUMADIN Take as directed. If you are unsure how to take this medication, talk to your nurse or doctor. Original instructions:  Take 1 tablet (5 mg total) by mouth daily at 6 PM.       Vitals:   12/28/17 0813 12/28/17 1431  BP:    Pulse:    Resp:    Temp: 98.6 F (37 C) 98.4 F (36.9 C)  SpO2:      Skin clean, dry and intact without evidence of skin break down, no evidence of skin tears noted. IV catheter discontinued intact. Site without signs and symptoms of complications. Dressing and pressure applied. Pt denies pain at this time. No complaints  noted.  An After Visit Summary was printed and given to the patient. Patient escorted via [place], and D/C home via private auto.  Chapman Fitch BSN, RN [employer] [address] Phone [phone]

## 2017-12-28 NOTE — Discharge Summary (Signed)
[name], is a 82 y.o. male  DOB 1935-10-28  MRN [id number].  Admission date:  12/23/2017  Admitting Physician  Deatra James, MD  Discharge Date:  12/28/2017   Primary MD  [name], [name], MD  Recommendations for primary care physician for things to follow:  -Please check CBC, BMP during next visit -Patient will need INR to be drawn next Monday on 12/31/2017, and this is to be sent to Coumadin clinic regarding further recommendation about bridging with Lovenox.  Admission Diagnosis  Hyperkalemia [E87.5] Elevated INR [R79.1] AKI (acute kidney injury) ([place]) [N17.9] Symptomatic anemia [D64.9]   Discharge Diagnosis  Hyperkalemia [E87.5] Elevated INR [R79.1] AKI (acute kidney injury) ([place]) [N17.9] Symptomatic anemia [D64.9]    Principal Problem:   Severe anemia Active Problems:   HLD (hyperlipidemia)   Essential hypertension   Atrial fibrillation (HCC)   Chronic diastolic heart failure (HCC)   Type II diabetes mellitus with complication (HCC)   Atherosclerosis of native arteries of extremity with intermittent claudication (HCC)   Hyperkalemia   Chronic anticoagulation   Right heart failure (HCC)   Supratherapeutic INR   Hyponatremia   Coronary artery disease with history of myocardial infarction without history of CABG      Past Medical History:  Diagnosis Date  . Carotid artery disease ([place]) 1994   s/p left carotid endarerectomy   . Chronic atrial fibrillation (HCC)    a. on coumadin   . Chronic diastolic CHF (congestive heart failure) ([place])   . Diabetes mellitus without complication ([place])    dx 2016  . Dyspnea   . Heart murmur   . Hx of CABG    a. 1994  . Hypertension   . OSA (obstructive sleep apnea)   . Rheumatic fever   . S/P AVR (aortic valve replacement)    a. mechanical valve 1996  . Subclavian bypass stenosis ([place])   . 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 Vitamin B12 deficiency     Past Surgical History:  Procedure Laterality Date  . [place]   replaced due to aortic stenosis, [employer] mechanical prostesis  . CAROTID ENDARTERECTOMY Left 1997   subclavian bypass Done in [place]  . CORONARY ARTERY BYPASS GRAFT  1994   w SVG to RCA and SVG to circumflex  . heart bypass     Done in [place]  . MULTIPLE EXTRACTIONS WITH ALVEOLOPLASTY Bilateral 12/12/2017   Procedure: Extraction of tooth #'s 1, 12,13,14,15,17,18,19, 29, and 30 with alveoloplasty and gross debridement of remaining teeth`;  Surgeon: Lenn Cal, DDS;  Location: [facility];  Service: Oral Surgery;  Laterality: Bilateral;  MULTIPLE EXTRACTION WITH ALVEOLOPLASTY WITH PRE PROSTHETIC SURGERY AND GROSS DEBRIDEMENT OF REMAINING TEETH  . TEE WITHOUT CARDIOVERSION N/A 10/23/2017   Procedure: TRANSESOPHAGEAL ECHOCARDIOGRAM (TEE);  Surgeon: Larey Dresser, MD;  Location: [facility] ENDOSCOPY;  Service: Cardiovascular;  Laterality: N/A;  . TONSILLECTOMY         History  of present illness and  Hospital Course:     Kindly see H&P for history of present illness and admission details, please review complete Labs, Consult reports and Test reports for all details in brief  HPI  from the history and physical done on the day of admission [name] is a 82 y.o. male with medical history significant of mechanical aortic valve 1994, OSA on CPAP, permanent A. fib, on chronic anticoagulant therapy with Coumadin, chronic combined CHF, CKD stage III, diabetes mellitus, coronary artery disease with CABG 1994, COPD, severe mitral regurgitation, carotid stenosis with left carotid enterotomy,HTN, HLD, presented to ED with generalized weaknesses, headache.  States he feels as if he was going to pass out.  His symptoms started about 1 to 2 days ago. He reports that he has had a tooth extraction recently and since extraction he  continued to bleed, bleeding has not stopped.  Dating to his Coumadin was not stopped for the procedure.  And he has been compliant with his medication including Coumadin. Complaining of mild shortness of breath but denies any headaches or asymmetric weaknesses aside from generalized weaknesses.  Electronic medical records were reviewed patient was admitted on 12/12/2017 for planned procedure of dental extraction for chronic apical periodontitis by Dr.Kalinsky.  Hospital course was complicated by postop anemia, V. tach, hypokalemia, volume overload.  Patient was subsequently stabilized and was discharged on 12/14/2017.  Patient was subsequently seen at heart failure clinic on 12/20/2017 reported continue to have steady bleeding from his gums.  At the time patient denied have any symptoms.  Is apparently clinic had plan to arrange for 1 unit of blood transfusion -   Currently he is feeling generalized weaknesses, lightheadedness, he is afebrile, hypotensive with a blood pressure 88/51. Aside from a tooth extraction he denies of any fever, chills, nausea, vomiting.  Denies any headaches visual changes asymmetric weaknesses though severe generalized weaknesses.  Denies any chest pain.  Mild shortness of breath.  Denies any abdominal pain constipation or diarrhea.  Denies any joint pain.  Denies any open wound with exception that his mouth tooth extraction.  Denies any dysuria.    Hospital Course   82 y.o. M w/ a hx of mechanical aortic valve (1994), OSA on CPAP, permanent A. fib, chronic anticoagulant therapy with Coumadin, chronic combined CHF, CKD stage III, DM, CAD with CABG 1994, COPD, severe mitral regurgitation, carotid stenosis with left carotid enterotomy, HTN, and HLD who presented to ED with generalized weakness and headache. He reported a tooth extraction on 9/11 after which his bleeding had not stopped. He continued to take his warfarin.  In the ED he was found to have a BP of 88/51, a Hgb  of 4.9, and an INR of 4.32.  Acute blood loss anemia - bleeding after dental surgery - patient had 10 teeth  extraction by Dr. Dorothyann Gibbs for dental infection, supportive course was complicated with significant bleed, when he required some transfusions as well, and Amicar rinses. -Required 6 units PRBC transfusion during this hospital admission so far -Was bridged with heparin GTT during hospital stay, resumed back on warfarin, INR is 0.4 today at day of discharge, he will be bridged with Lovenox, will have repeat INR by home care agency on Monday to determine the length of Lovenox bridging.  LastLabs         Recent Labs  Lab 12/24/17 1809 12/25/17 0507 12/25/17 1406 12/26/17 0500 12/27/17 0440  HGB 7.4* 7.4* 7.7* 7.0* 8.8*     RecentLabs  Lab Results  Component Value Date   INR 1.36 12/27/2017   INR 1.43 12/26/2017   INR 1.79 12/25/2017      Supratherapeutic INR  - chronically on Coumadin due to mechanical aortic valve + A. Fib , was elevated on admission, warfarin has been on hold for last few days, back on warfarin .  INR is 1.4-day of discharge  Chronic hypotension  - chronic issue currently asymptomatic monitor.  On moderate to high dose of diuretic at baseline.  Acute renal insufficiency  -Due to to volume depletion from anemia, resolved with transfusion  Hyponatremia  -Improving with initiation of torsemide, CHF team following .  Hypokalemia  -Repleted, he will be discharged with supplement  HLD -continue home medication no acute issue.  On combination of statin along with ezetimibe.   Chronic atrial fibrillation [place] vas 2 score of at least 4.  Continue combination of amiodarone and anticoagulation as above again cardiology team has been reconsulted.   Chronic diastolic CHF w/ prominent RV failure  -   Last TTE 10/23/2017, EF 55-60%, sever MR, mechanical AV - followed by CHF Team, have been reconsulted, continue oral diuretic for now.   Metolazone was added on discharge per HF team recommendation  DM2 - CBG well controlled on diet control.  Coronary artery disease with history of myocardial infarction s/p CABG - Transfuse as needed to keep Hgb close to 7.5 - 8 .      Discharge Condition:  Stable   Follow UP  Follow-up Information    [facility] Follow [facility].   Specialty:  [place] Why:  resumption of home health services arranged Contact information: [address] [place] [address] [phone]        [facility]. Go on 01/09/2018.   Specialty:  Cardiology Why:  1:30 PM, [facility], parking code 1700 Contact information: [address] [phone] [place] [phone]            Discharge Instructions  and  Discharge Medications     Discharge Instructions    Discharge instructions   Complete by:  As directed    ollow with Primary MD [name], [name], MD in 7 days   Get CBC, CMP,  checked  by Primary MD next visit.  - Please have your INR checked by Monday.  Activity: As tolerated with Full fall precautions use walker/cane & assistance as needed   Disposition Home    Diet: Heart Healthy dysphagia 2 diet, with feeding assistance and aspiration precautions.  For Heart failure patients - Check your Weight same time everyday, if you gain over 2 pounds, or you develop in leg swelling, experience more shortness of breath or chest pain, call your Primary MD immediately. Follow Cardiac Low Salt Diet and 1.5 lit/day fluid restriction.   On your next visit with your primary care physician please Get Medicines reviewed and adjusted.   Please request your Prim.MD to go over all Hospital Tests and Procedure/Radiological results at the follow up, please get all Hospital records sent to your Prim MD by signing hospital release before you go home.   If  you experience worsening of your admission symptoms, develop shortness of breath, life threatening emergency, suicidal or homicidal thoughts you must seek medical attention immediately by calling 911 or calling your MD immediately  if symptoms less severe.  You Must read complete instructions/literature along with all the possible adverse  reactions/side effects for all the Medicines you take and that have been prescribed to you. Take any new Medicines after you have completely understood and accpet all the possible adverse reactions/side effects.   Do not drive, operating heavy machinery, perform activities at heights, swimming or participation in water activities or provide baby sitting services if your were admitted for syncope or siezures until you have seen by Primary MD or a Neurologist and advised to do so again.  Do not drive when taking Pain medications.    Do not take more than prescribed Pain, Sleep and Anxiety Medications  Special Instructions: If you have smoked or chewed Tobacco  in the last 2 yrs please stop smoking, stop any regular Alcohol  and or any Recreational drug use.  Wear Seat belts while driving.   Please note  You were cared for by a hospitalist during your hospital stay. If you have any questions about your discharge medications or the care you received while you were in the hospital after you are discharged, you can call the unit and asked to speak with the hospitalist on call if the hospitalist that took care of you is not available. Once you are discharged, your primary care physician will handle any further medical issues. Please note that NO REFILLS for any discharge medications will be authorized once you are discharged, as it is imperative that you return to your primary care physician (or establish a relationship with a primary care physician if you do not have one) for your aftercare needs so that they can reassess your need for medications and monitor your lab  values.   Increase activity slowly   Complete by:  As directed      Allergies as of 12/28/2017      Reactions   Rifampin Rash   May have been caused by Vancomycin or Rifampin (??)   Vancomycin Rash   May have been caused by Vancomycin or Rifampin (??)      Medication List    TAKE these medications   allopurinol 100 MG tablet Commonly known as:  ZYLOPRIM Take 1 tablet (100 mg total) by mouth daily.   amiodarone 200 MG tablet Commonly known as:  PACERONE Take 1 tablet (200 mg total) by mouth daily. What changed:  when to take this   enoxaparin 100 MG/ML injection Commonly known as:  LOVENOX Inject 1 mL (100 mg total) into the skin every 12 (twelve) hours. Start taking on:  12/29/2017   ezetimibe-simvastatin 10-20 MG tablet Commonly known as:  VYTORIN Take 1 tablet by mouth at bedtime.   metolazone 2.5 MG tablet Commonly known as:  ZAROXOLYN Take 1 tablet (2.5 mg total) by mouth every Monday. Start taking on:  12/31/2017   Potassium Chloride ER 20 MEQ Tbcr Take 40 mEq by mouth daily. PLEASE TAKE EXTRA DOSE EVERY MONDAY   torsemide 100 MG tablet Commonly known as:  DEMADEX Take 1 tablet (100 mg total) by mouth 2 (two) times daily.   warfarin 5 MG tablet Commonly known as:  COUMADIN Take as directed. If you are unsure how to take this medication, talk to your nurse or doctor. Original instructions:  Take 1 tablet (5 mg total) by mouth daily at 6 PM.         Diet and Activity recommendation: See Discharge Instructions above   Consults obtained -   Cardiology  Major procedures and Radiology Reports - PLEASE review detailed and final reports for all details, in brief -  Achieved 6 unit PRBC transfusion during this hospital stay   Dg Chest Portable 1 View  Result Date: 12/23/2017 CLINICAL DATA:  Weakness, fatigue EXAM: PORTABLE CHEST 1 VIEW COMPARISON:  10/24/2017 FINDINGS: Lungs are clear.  No pleural effusion or pneumothorax. Cardiomegaly. Prominent  pulmonary markings in the bilateral lower lobes. Median sternotomy. IMPRESSION: No evidence of acute cardiopulmonary disease. Electronically Signed   By: Julian Hy M.D.   On: 12/23/2017 14:15    Micro Results     Recent Results (from the past 240 hour(s))  MRSA PCR Screening     Status: None   Collection Time: 12/23/17  6:49 PM  Result Value Ref Range Status   MRSA by PCR NEGATIVE NEGATIVE Final    Comment:        The GeneXpert MRSA Assay (FDA approved for NASAL specimens only), is one component of a comprehensive MRSA colonization surveillance program. It is not intended to diagnose MRSA infection nor to guide or monitor treatment for MRSA infections. Performed at [facility], [place] [address], [place], [place] [address]        Today   Subjective:   [name] today has no headache,no chest abdominal pain,no new weakness tingling or numbness, feels much better wants to go home today.  Denies any further bleeding from mouth Objective:   Blood pressure (!) 89/50, pulse 80, temperature 98.4 F (36.9 C), temperature source Oral, resp. rate 15, height 6\' 2"  (1.88 m), weight 98.6 kg, SpO2 97 %.   Intake/Output Summary (Last 24 hours) at 12/28/2017 1502 Last data filed at 12/28/2017 1042 Gross per 24 hour  Intake 561.88 ml  Output 2400 ml  Net -1838.12 ml    Exam Awake Alert, Oriented x 3, No new F.N deficits, Normal affect Symmetrical Chest wall movement, Good air movement bilaterally, CTAB Irregular irregular, mechanical S1, murmur present . +ve B.Sounds, Abd Soft, Non tender, No organomegaly appriciated, No rebound -guarding or rigidity. No Cyanosis, Clubbing, has +1 edema B/L , No new Rash or bruise  Data Review   CBC w Diff:  Lab Results  Component Value Date   WBC 5.9 12/28/2017   HGB 8.7 (L) 12/28/2017   HGB 9.3 (L) 08/01/2017   HGB 11.6 (L) 03/30/2017   HGB 12.0 (L) 01/19/2017   HCT 27.7 (L) 12/28/2017   HCT 33.8 (L) 03/30/2017   HCT  37.8 (L) 01/19/2017   PLT 115 (L) 12/28/2017   PLT 56 (L) 08/01/2017   PLT 59 (LL) 03/30/2017   LYMPHOPCT 20 12/23/2017   LYMPHOPCT 19.8 01/19/2017   BANDSPCT 0 12/12/2017   MONOPCT 10 12/23/2017   MONOPCT 7.6 01/19/2017   EOSPCT 0 12/23/2017   EOSPCT 2.3 01/19/2017   BASOPCT 0 12/23/2017   BASOPCT 0.3 01/19/2017    CMP:  Lab Results  Component Value Date   NA 130 (L) 12/28/2017   NA 140 07/27/2017   NA 140 01/19/2017   K 3.6 12/28/2017   K 4.8 01/19/2017   CL 94 (L) 12/28/2017   CO2 28 12/28/2017   CO2 24 01/19/2017   BUN 20 12/28/2017   BUN 44 (H) 07/27/2017   BUN 28.4 (H) 01/19/2017   CREATININE 1.29 (H) 12/28/2017   CREATININE 1.43 (H) 04/20/2017   CREATININE 1.2 01/19/2017   PROT 5.8 (L) 12/23/2017   PROT 6.9 01/31/2017   PROT 7.4 01/19/2017   ALBUMIN 2.8 (L) 12/23/2017   ALBUMIN 4.3 01/31/2017   ALBUMIN 3.8 01/19/2017   BILITOT 1.2 12/23/2017  BILITOT 1.1 04/20/2017   BILITOT 1.03 01/19/2017   ALKPHOS 77 12/23/2017   ALKPHOS 146 01/19/2017   AST 24 12/23/2017   AST 17 04/20/2017   AST 20 01/19/2017   ALT 8 12/23/2017   ALT 13 04/20/2017   ALT 13 01/19/2017  .   Total Time in preparing paper work, data evaluation and todays exam - 28 minutes  Phillips Climes M.D on 12/28/2017 at 3:02 PM  [facility]   Office  [phone]

## 2017-12-29 DIAGNOSIS — D631 Anemia in chronic kidney disease: Secondary | ICD-10-CM | POA: Diagnosis not present

## 2017-12-29 DIAGNOSIS — I2510 Atherosclerotic heart disease of native coronary artery without angina pectoris: Secondary | ICD-10-CM | POA: Diagnosis not present

## 2017-12-29 DIAGNOSIS — N183 Chronic kidney disease, stage 3 (moderate): Secondary | ICD-10-CM | POA: Diagnosis not present

## 2017-12-29 DIAGNOSIS — I5033 Acute on chronic diastolic (congestive) heart failure: Secondary | ICD-10-CM | POA: Diagnosis not present

## 2017-12-29 DIAGNOSIS — I130 Hypertensive heart and chronic kidney disease with heart failure and stage 1 through stage 4 chronic kidney disease, or unspecified chronic kidney disease: Secondary | ICD-10-CM | POA: Diagnosis not present

## 2017-12-29 DIAGNOSIS — E1122 Type 2 diabetes mellitus with diabetic chronic kidney disease: Secondary | ICD-10-CM | POA: Diagnosis not present

## 2017-12-31 ENCOUNTER — Ambulatory Visit (INDEPENDENT_AMBULATORY_CARE_PROVIDER_SITE_OTHER): Payer: Medicare Other | Admitting: Pharmacist

## 2017-12-31 DIAGNOSIS — N183 Chronic kidney disease, stage 3 (moderate): Secondary | ICD-10-CM | POA: Diagnosis not present

## 2017-12-31 DIAGNOSIS — I130 Hypertensive heart and chronic kidney disease with heart failure and stage 1 through stage 4 chronic kidney disease, or unspecified chronic kidney disease: Secondary | ICD-10-CM | POA: Diagnosis not present

## 2017-12-31 DIAGNOSIS — Z952 Presence of prosthetic heart valve: Secondary | ICD-10-CM

## 2017-12-31 DIAGNOSIS — I481 Persistent atrial fibrillation: Secondary | ICD-10-CM | POA: Diagnosis not present

## 2017-12-31 DIAGNOSIS — Z7189 Other specified counseling: Secondary | ICD-10-CM | POA: Diagnosis not present

## 2017-12-31 DIAGNOSIS — I5033 Acute on chronic diastolic (congestive) heart failure: Secondary | ICD-10-CM | POA: Diagnosis not present

## 2017-12-31 DIAGNOSIS — E1122 Type 2 diabetes mellitus with diabetic chronic kidney disease: Secondary | ICD-10-CM | POA: Diagnosis not present

## 2017-12-31 DIAGNOSIS — I4819 Other persistent atrial fibrillation: Secondary | ICD-10-CM

## 2017-12-31 DIAGNOSIS — I2510 Atherosclerotic heart disease of native coronary artery without angina pectoris: Secondary | ICD-10-CM | POA: Diagnosis not present

## 2017-12-31 DIAGNOSIS — D631 Anemia in chronic kidney disease: Secondary | ICD-10-CM | POA: Diagnosis not present

## 2017-12-31 LAB — POCT INR: INR: 1.6 — AB (ref 2.0–3.0)

## 2018-01-01 ENCOUNTER — Ambulatory Visit (HOSPITAL_COMMUNITY): Payer: Self-pay | Admitting: Dentistry

## 2018-01-01 ENCOUNTER — Encounter (HOSPITAL_COMMUNITY): Payer: Self-pay | Admitting: Dentistry

## 2018-01-01 ENCOUNTER — Telehealth (HOSPITAL_COMMUNITY): Payer: Self-pay | Admitting: *Deleted

## 2018-01-01 VITALS — BP 123/65 | HR 67 | Temp 97.9°F

## 2018-01-01 DIAGNOSIS — Z7901 Long term (current) use of anticoagulants: Secondary | ICD-10-CM

## 2018-01-01 DIAGNOSIS — Z952 Presence of prosthetic heart valve: Secondary | ICD-10-CM

## 2018-01-01 DIAGNOSIS — I340 Nonrheumatic mitral (valve) insufficiency: Secondary | ICD-10-CM

## 2018-01-01 DIAGNOSIS — R791 Abnormal coagulation profile: Secondary | ICD-10-CM

## 2018-01-01 DIAGNOSIS — K08199 Complete loss of teeth due to other specified cause, unspecified class: Secondary | ICD-10-CM

## 2018-01-01 DIAGNOSIS — T888XXA Other specified complications of surgical and medical care, not elsewhere classified, initial encounter: Secondary | ICD-10-CM

## 2018-01-01 NOTE — Progress Notes (Signed)
POST OPERATIVE NOTE:  01/01/2018 [name] [id number]  VITALS: BP 123/65   Pulse 67   Temp 97.9 F (36.6 C)    LABS:  Lab Results  Component Value Date   WBC 5.9 12/28/2017   HGB 8.7 (L) 12/28/2017   HCT 27.7 (L) 12/28/2017   MCV 95.5 12/28/2017   PLT 115 (L) 12/28/2017   BMET    Component Value Date/Time   NA 130 (L) 12/28/2017 0503   NA 140 07/27/2017 1159   NA 140 01/19/2017 0827   K 3.6 12/28/2017 0503   K 4.8 01/19/2017 0827   CL 94 (L) 12/28/2017 0503   CO2 28 12/28/2017 0503   CO2 24 01/19/2017 0827   GLUCOSE 101 (H) 12/28/2017 0503   GLUCOSE 103 01/19/2017 0827   BUN 20 12/28/2017 0503   BUN 44 (H) 07/27/2017 1159   BUN 28.4 (H) 01/19/2017 0827   CREATININE 1.29 (H) 12/28/2017 0503   CREATININE 1.43 (H) 04/20/2017 0943   CREATININE 1.2 01/19/2017 0827   CALCIUM 7.9 (L) 12/28/2017 0503   CALCIUM 9.7 01/19/2017 0827   GFRNONAA 51 (L) 12/28/2017 0503   GFRNONAA 45 (L) 04/20/2017 0943   GFRAA 59 (L) 12/28/2017 0503   GFRAA 52 (L) 04/20/2017 0943    Lab Results  Component Value Date   INR 1.6 (A) 12/31/2017   INR 1.43 12/28/2017   INR 1.36 12/27/2017   No results found for: PTT   [name] is status post multiple extractions with alveoloplasty and gross debridement of remaining dentition in the operating room on 12/12/2017.  Patient has a history of postoperative bleeding with most recent episode related to supratherapeutic INR. The patient was admitted on 12/23/2017 and had multiple transfusions related to the oral bleeding. The patient was then discharged on 12/28/2017.  The patient now presents for evaluation of healing and suture removal as needed.  SUBJECTIVE: The patient denies any active bleeding. Patient denies having significant oral discomfort from dental extraction sites.patient denies having any sutures that remain.  EXAM: There is no sign of infection, heme, or ooze. Sutures are all gone. Patient is healing in by generalized  primary closure and some areas by secondary intention.  PROCEDURE: The patient was given a chlorhexidine gluconate rinse for 30 seconds. No sutures needed to be removed.  ASSESSMENT: Post operative course is consistent with dental procedures performed in the operating room. Postoperative bleeding consistent with use of anticoagulant therapy and thrombocytopenia Loss of teeth due to extraction Multiple missing teeth Malocclusion   PLAN: 1. Use gentle salt water rinses as needed to aid healing. 2. Brush teeth after meals and at bedtime 3. Follow-up with the general dentist of his choice for continued periodontal maintenance procedures, restorations as needed, and evaluation for replacement of missing teeth if so desired. The patient is interested in following up with Dr. Lorenda Cahill for future dental care.  Release of information was obtained. Patient will contact Dental Medicine once the appointment has been made with Dr. Alinda Money. I will then forward medical records to Dr. Alinda Money. 4. Patient does require antibiotic premedication prior to invasive dental procedures due to previous heart valve surgery.  [name], DDS

## 2018-01-01 NOTE — Telephone Encounter (Signed)
Referral placed for Dr [name]

## 2018-01-01 NOTE — Patient Instructions (Addendum)
PLAN: 1. Use gentle salt water rinses as needed to aid healing. 2. Brush teeth after meals and at bedtime 3. Follow-up with the general dentist of his choice for continued periodontal maintenance procedures, restorations as needed, and evaluation for replacement of missing teeth if so desired. The patient is interested in following up with Dr. Lorenda Cahill for future dental care.  Release of information was obtained. Patient will contact Dental Medicine once the appointment has been made with Dr. Alinda Money. I will then forward medical records to Dr. Alinda Money. 4. Patient does require antibiotic premedication prior to invasive dental procedures due to previous heart valve surgery.  Lenn Cal, DDS

## 2018-01-01 NOTE — Telephone Encounter (Signed)
-----   Message from Larey Dresser, MD sent at 12/29/2017  9:06 PM EDT ----- Can you get this patient back in with Dr. [name] for a reassessment for mitral valve replacement?  Thanks.   ----- Message ----- From: Rexene Alberts, MD Sent: 12/28/2017   4:51 PM EDT To: Larey Dresser, MD  When I saw him in July I remember being concerned about RV dysfunction.  Is he doing better now that you've diuresed him so much?  I am happy to see him back if you think he looks better than he did 2 months ago.  If we were gonna think about surgery he would need L+R cath and dental extraction  ----- Message ----- From: Larey Dresser, MD Sent: 12/28/2017   1:38 PM EDT To: Rexene Alberts, MD  Cub: Could you take a look over Mr [name] again? Not Mitraclip candidate.  We had thought he was too high risk for minimally invasive mitral valve.  He has lost about 90 lbs total with diuresis but still taking a lot of diuretic to keep him out of overt heart failure with severe MR.  Do you think it would be worthwhile to look over him again or think a hard no for surgery? His mobility is better but still somewhat frail.

## 2018-01-02 NOTE — Telephone Encounter (Signed)
Opened in error

## 2018-01-04 ENCOUNTER — Ambulatory Visit (INDEPENDENT_AMBULATORY_CARE_PROVIDER_SITE_OTHER): Payer: Medicare Other | Admitting: Cardiovascular Disease

## 2018-01-04 DIAGNOSIS — Z5181 Encounter for therapeutic drug level monitoring: Secondary | ICD-10-CM

## 2018-01-04 DIAGNOSIS — Z952 Presence of prosthetic heart valve: Secondary | ICD-10-CM

## 2018-01-04 DIAGNOSIS — E1122 Type 2 diabetes mellitus with diabetic chronic kidney disease: Secondary | ICD-10-CM | POA: Diagnosis not present

## 2018-01-04 DIAGNOSIS — I130 Hypertensive heart and chronic kidney disease with heart failure and stage 1 through stage 4 chronic kidney disease, or unspecified chronic kidney disease: Secondary | ICD-10-CM | POA: Diagnosis not present

## 2018-01-04 DIAGNOSIS — Z7189 Other specified counseling: Secondary | ICD-10-CM

## 2018-01-04 DIAGNOSIS — I5033 Acute on chronic diastolic (congestive) heart failure: Secondary | ICD-10-CM | POA: Diagnosis not present

## 2018-01-04 DIAGNOSIS — N183 Chronic kidney disease, stage 3 (moderate): Secondary | ICD-10-CM | POA: Diagnosis not present

## 2018-01-04 DIAGNOSIS — D631 Anemia in chronic kidney disease: Secondary | ICD-10-CM | POA: Diagnosis not present

## 2018-01-04 DIAGNOSIS — I2510 Atherosclerotic heart disease of native coronary artery without angina pectoris: Secondary | ICD-10-CM | POA: Diagnosis not present

## 2018-01-04 LAB — POCT INR: INR: 2.2 (ref 2.0–3.0)

## 2018-01-04 MED ORDER — ENOXAPARIN SODIUM 100 MG/ML ~~LOC~~ SOLN: 100.0000 mg | Freq: Two times a day (BID) | SUBCUTANEOUS | 1 refills | Status: DC

## 2018-01-04 NOTE — Patient Instructions (Signed)
Description   Spoke with Evelina Bucy, RN [facility] while in home with pt, advised to have pt take 10mg  today then change dose to 1 tablet everyday except 1.5 tablets on Mondays, Wednesdays, and Fridays. Continue Lovenox until Recheck on Tuesday. Orders given to [facility] RN with [facility] -  Call with any new medications or procedures [phone].

## 2018-01-05 DIAGNOSIS — D631 Anemia in chronic kidney disease: Secondary | ICD-10-CM | POA: Diagnosis not present

## 2018-01-05 DIAGNOSIS — N183 Chronic kidney disease, stage 3 (moderate): Secondary | ICD-10-CM | POA: Diagnosis not present

## 2018-01-05 DIAGNOSIS — I2510 Atherosclerotic heart disease of native coronary artery without angina pectoris: Secondary | ICD-10-CM | POA: Diagnosis not present

## 2018-01-05 DIAGNOSIS — E1122 Type 2 diabetes mellitus with diabetic chronic kidney disease: Secondary | ICD-10-CM | POA: Diagnosis not present

## 2018-01-05 DIAGNOSIS — I130 Hypertensive heart and chronic kidney disease with heart failure and stage 1 through stage 4 chronic kidney disease, or unspecified chronic kidney disease: Secondary | ICD-10-CM | POA: Diagnosis not present

## 2018-01-05 DIAGNOSIS — I5033 Acute on chronic diastolic (congestive) heart failure: Secondary | ICD-10-CM | POA: Diagnosis not present

## 2018-01-07 ENCOUNTER — Ambulatory Visit (INDEPENDENT_AMBULATORY_CARE_PROVIDER_SITE_OTHER): Payer: Medicare Other | Admitting: Internal Medicine

## 2018-01-07 ENCOUNTER — Encounter: Payer: Self-pay | Admitting: Internal Medicine

## 2018-01-07 ENCOUNTER — Other Ambulatory Visit (INDEPENDENT_AMBULATORY_CARE_PROVIDER_SITE_OTHER): Payer: Medicare Other

## 2018-01-07 ENCOUNTER — Telehealth: Payer: Self-pay | Admitting: Internal Medicine

## 2018-01-07 DIAGNOSIS — D649 Anemia, unspecified: Secondary | ICD-10-CM | POA: Diagnosis not present

## 2018-01-07 DIAGNOSIS — I4811 Longstanding persistent atrial fibrillation: Secondary | ICD-10-CM | POA: Diagnosis not present

## 2018-01-07 DIAGNOSIS — Z7984 Long term (current) use of oral hypoglycemic drugs: Secondary | ICD-10-CM | POA: Diagnosis not present

## 2018-01-07 DIAGNOSIS — D696 Thrombocytopenia, unspecified: Secondary | ICD-10-CM | POA: Diagnosis not present

## 2018-01-07 DIAGNOSIS — Z7901 Long term (current) use of anticoagulants: Secondary | ICD-10-CM | POA: Diagnosis not present

## 2018-01-07 DIAGNOSIS — I5033 Acute on chronic diastolic (congestive) heart failure: Secondary | ICD-10-CM | POA: Diagnosis not present

## 2018-01-07 DIAGNOSIS — E1122 Type 2 diabetes mellitus with diabetic chronic kidney disease: Secondary | ICD-10-CM | POA: Diagnosis not present

## 2018-01-07 DIAGNOSIS — R609 Edema, unspecified: Secondary | ICD-10-CM

## 2018-01-07 DIAGNOSIS — R300 Dysuria: Secondary | ICD-10-CM

## 2018-01-07 DIAGNOSIS — I130 Hypertensive heart and chronic kidney disease with heart failure and stage 1 through stage 4 chronic kidney disease, or unspecified chronic kidney disease: Secondary | ICD-10-CM | POA: Diagnosis not present

## 2018-01-07 DIAGNOSIS — M79672 Pain in left foot: Secondary | ICD-10-CM | POA: Insufficient documentation

## 2018-01-07 DIAGNOSIS — Z87891 Personal history of nicotine dependence: Secondary | ICD-10-CM | POA: Diagnosis not present

## 2018-01-07 DIAGNOSIS — I5032 Chronic diastolic (congestive) heart failure: Secondary | ICD-10-CM | POA: Diagnosis not present

## 2018-01-07 DIAGNOSIS — D61818 Other pancytopenia: Secondary | ICD-10-CM | POA: Diagnosis not present

## 2018-01-07 DIAGNOSIS — Z951 Presence of aortocoronary bypass graft: Secondary | ICD-10-CM | POA: Diagnosis not present

## 2018-01-07 DIAGNOSIS — G4733 Obstructive sleep apnea (adult) (pediatric): Secondary | ICD-10-CM | POA: Diagnosis not present

## 2018-01-07 DIAGNOSIS — N183 Chronic kidney disease, stage 3 (moderate): Secondary | ICD-10-CM | POA: Diagnosis not present

## 2018-01-07 DIAGNOSIS — M79605 Pain in left leg: Secondary | ICD-10-CM | POA: Diagnosis not present

## 2018-01-07 DIAGNOSIS — Z952 Presence of prosthetic heart valve: Secondary | ICD-10-CM | POA: Diagnosis not present

## 2018-01-07 DIAGNOSIS — I2510 Atherosclerotic heart disease of native coronary artery without angina pectoris: Secondary | ICD-10-CM | POA: Diagnosis not present

## 2018-01-07 DIAGNOSIS — Z5181 Encounter for therapeutic drug level monitoring: Secondary | ICD-10-CM | POA: Diagnosis not present

## 2018-01-07 DIAGNOSIS — I482 Chronic atrial fibrillation, unspecified: Secondary | ICD-10-CM | POA: Diagnosis not present

## 2018-01-07 DIAGNOSIS — D631 Anemia in chronic kidney disease: Secondary | ICD-10-CM | POA: Diagnosis not present

## 2018-01-07 DIAGNOSIS — M109 Gout, unspecified: Secondary | ICD-10-CM | POA: Diagnosis not present

## 2018-01-07 DIAGNOSIS — Z9981 Dependence on supplemental oxygen: Secondary | ICD-10-CM | POA: Diagnosis not present

## 2018-01-07 DIAGNOSIS — J449 Chronic obstructive pulmonary disease, unspecified: Secondary | ICD-10-CM | POA: Diagnosis not present

## 2018-01-07 LAB — URINALYSIS, ROUTINE W REFLEX MICROSCOPIC
BILIRUBIN URINE: NEGATIVE
Ketones, ur: NEGATIVE
NITRITE: POSITIVE — AB
Specific Gravity, Urine: 1.01 (ref 1.000–1.030)
TOTAL PROTEIN, URINE-UPE24: 30 — AB
Urine Glucose: NEGATIVE
Urobilinogen, UA: 1 (ref 0.0–1.0)
pH: 6.5 (ref 5.0–8.0)

## 2018-01-07 LAB — CBC WITH DIFFERENTIAL/PLATELET
BASOS ABS: 0.1 10*3/uL (ref 0.0–0.1)
Basophils Relative: 0.7 % (ref 0.0–3.0)
EOS ABS: 0 10*3/uL (ref 0.0–0.7)
Eosinophils Relative: 0.1 % (ref 0.0–5.0)
HCT: 20.5 % — CL (ref 39.0–52.0)
Lymphocytes Relative: 10.6 % — ABNORMAL LOW (ref 12.0–46.0)
Lymphs Abs: 0.9 10*3/uL (ref 0.7–4.0)
MCHC: 34.8 g/dL (ref 30.0–36.0)
MCV: 90.3 fl (ref 78.0–100.0)
MONOS PCT: 6.7 % (ref 3.0–12.0)
Monocytes Absolute: 0.6 10*3/uL (ref 0.1–1.0)
Neutro Abs: 7.1 10*3/uL (ref 1.4–7.7)
Neutrophils Relative %: 81.9 % — ABNORMAL HIGH (ref 43.0–77.0)
PLATELETS: 191 10*3/uL (ref 150.0–400.0)
RBC: 2.27 Mil/uL — ABNORMAL LOW (ref 4.22–5.81)
RDW: 20.9 % — ABNORMAL HIGH (ref 11.5–15.5)
WBC: 8.7 10*3/uL (ref 4.0–10.5)

## 2018-01-07 LAB — BASIC METABOLIC PANEL
BUN: 39 mg/dL — AB (ref 6–23)
CHLORIDE: 92 meq/L — AB (ref 96–112)
CO2: 30 mEq/L (ref 19–32)
CREATININE: 1.51 mg/dL — AB (ref 0.40–1.50)
Calcium: 8.8 mg/dL (ref 8.4–10.5)
GFR: 47.41 mL/min — ABNORMAL LOW (ref >60.00)
GLUCOSE: 125 mg/dL — AB (ref 70–99)
POTASSIUM: 4.9 meq/L (ref 3.5–5.1)
Sodium: 129 mEq/L — ABNORMAL LOW (ref 135–145)

## 2018-01-07 NOTE — Progress Notes (Signed)
Subjective:  Patient ID: [name], male    DOB: 05/06/1935  Age: 82 y.o. MRN: [id number]  CC: No chief complaint on file.   HPI [name] presents for post-hosp f/u for CHF, anemia, CRF d/c'd on 9/27:  C/o L thigh pain after he walked out of the shower last week  "Hospital Course   82 y.o. M w/ a hx of mechanical aortic valve (1994), OSA on CPAP, permanent A. fib, chronic anticoagulant therapy with Coumadin, chronic combined CHF, CKD stage III, DM, CAD with CABG 1994, COPD, severe mitral regurgitation, carotid stenosis with left carotid enterotomy, HTN, and HLD who presented to ED with generalized weakness and headache. He reported a tooth extraction on 9/11 after which his bleeding had not stopped. He continued to take his warfarin. In the ED he was found to have a BP of 88/51, a Hgb of 4.9, and an INR of 4.32.  Acute blood loss anemia - bleeding after dental surgery - patient had 10 teeth extraction by Dr. Dorothyann Gibbs for dental infection, supportive course was complicated with significant bleed, when he required some transfusions as well, and Amicar rinses. -Required 6 units PRBC transfusion during this hospital admission so far -Was bridged with heparin GTT during hospital stay, resumed back on warfarin, INR is 0.4 today at day of discharge, he will be bridged with Lovenox, will have repeat INR by home care agency on Monday to determine the length of Lovenox bridging.  LastLabs         Recent Labs  Lab 12/24/17 1809 12/25/17 0507 12/25/17 1406 12/26/17 0500 12/27/17 0440  HGB 7.4* 7.4* 7.7* 7.0* 8.8*     RecentLabs       Lab Results  Component Value Date   INR 1.36 12/27/2017   INR 1.43 12/26/2017   INR 1.79 12/25/2017      Supratherapeutic INR - chronically on Coumadin due to mechanical aortic valve + A. Fib ,was elevated on admission, warfarin has been on hold for last few days, back on warfarin .  INR is 1.4-day of  discharge  Chronic hypotension - chronic issue currently asymptomatic monitor. On moderate to high dose of diuretic at baseline.  Acute renal insufficiency  -Due to to volume depletion from anemia, resolved with transfusion  Hyponatremia -Improving with initiation of torsemide, CHF team following .  Hypokalemia -Repleted, he will be discharged with supplement  HLD-continue home medication no acute issue. On combination of statin along with ezetimibe.   Chronic atrial fibrillation [place] vas 2 score of at least 4. Continue combination of amiodarone and anticoagulation as above again cardiology team has been reconsulted.  Chronic diastolic CHF w/ prominent RV failure - Last TTE 10/23/2017, EF 55-60%, sever MR, mechanical AV - followed by CHF Team, have been reconsulted, continue oral diuretic for now.  Metolazone was added on discharge per HF team recommendation  DM2- CBG well controlled on diet control.  Coronary artery disease with history of myocardial infarction s/p CABG - Transfuse as needed to keep Hgb close to 7.5 - 8 ."      Outpatient Medications Prior to Visit  Medication Sig Dispense Refill  . allopurinol (ZYLOPRIM) 100 MG tablet Take 1 tablet (100 mg total) by mouth daily. 30 tablet 3  . amiodarone (PACERONE) 200 MG tablet Take 1 tablet (200 mg total) by mouth daily. 60 tablet 3  . enoxaparin (LOVENOX) 100 MG/ML injection Inject 1 mL (100 mg total) into the skin every 12 (twelve) hours. 10 Syringe 1  .  ezetimibe-simvastatin (VYTORIN) 10-20 MG tablet Take 1 tablet by mouth at bedtime. 90 tablet 3  . metolazone (ZAROXOLYN) 2.5 MG tablet Take 1 tablet (2.5 mg total) by mouth every Monday. 10 tablet 0  . Potassium Chloride ER 20 MEQ TBCR Take 40 mEq by mouth daily. PLEASE TAKE EXTRA DOSE EVERY MONDAY 60 tablet 0  . torsemide (DEMADEX) 100 MG tablet Take 1 tablet (100 mg total) by mouth 2 (two) times daily. 60 tablet 3  . warfarin (COUMADIN) 5 MG tablet  Take 1 tablet (5 mg total) by mouth daily at 6 PM. 20 tablet 0   No facility-administered medications prior to visit.     ROS: Review of Systems  Constitutional: Positive for fatigue and unexpected weight change. Negative for appetite change.  HENT: Negative for congestion, nosebleeds, sneezing, sore throat and trouble swallowing.   Eyes: Negative for itching and visual disturbance.  Respiratory: Positive for shortness of breath. Negative for cough.   Cardiovascular: Positive for leg swelling. Negative for chest pain and palpitations.  Gastrointestinal: Negative for abdominal distention, blood in stool, diarrhea and nausea.  Genitourinary: Positive for dysuria. Negative for frequency and hematuria.  Musculoskeletal: Positive for gait problem. Negative for back pain, joint swelling and neck pain.  Skin: Negative for rash.  Neurological: Positive for weakness. Negative for dizziness, tremors and speech difficulty.  Psychiatric/Behavioral: Negative for agitation, dysphoric mood, sleep disturbance and suicidal ideas. The patient is not nervous/anxious.     Objective:  BP (!) 108/56 (BP Location: Right Arm, Patient Position: Sitting, Cuff Size: Normal)   Pulse 72   Temp 98.2 F (36.8 C) (Oral)   Ht 6\' 2"  (1.88 m)   Wt 209 lb (94.8 kg)   SpO2 96%   BMI 26.83 kg/m   BP Readings from Last 3 Encounters:  01/07/18 (!) 108/56  01/01/18 123/65  12/28/17 (!) 89/50    Wt Readings from Last 3 Encounters:  01/07/18 209 lb (94.8 kg)  12/28/17 217 lb 6 oz (98.6 kg)  12/20/17 222 lb 4.8 oz (100.8 kg)    Physical Exam  Constitutional: He is oriented to person, place, and time. He appears well-developed. No distress.  NAD  HENT:  Mouth/Throat: Oropharynx is clear and moist.  Eyes: Pupils are equal, round, and reactive to light. Conjunctivae are normal.  Neck: Normal range of motion. No JVD present. No thyromegaly present.  Cardiovascular: Intact distal pulses. Exam reveals no gallop and  no friction rub.  Murmur heard. Pulmonary/Chest: Effort normal and breath sounds normal. No respiratory distress. He has no wheezes. He has no rales. He exhibits no tenderness.  Abdominal: Soft. Bowel sounds are normal. He exhibits no distension and no mass. There is no tenderness. There is no rebound and no guarding.  Musculoskeletal: Normal range of motion. He exhibits edema. He exhibits no tenderness.  Lymphadenopathy:    He has no cervical adenopathy.  Neurological: He is alert and oriented to person, place, and time. He has normal reflexes. No cranial nerve deficit. He exhibits normal muscle tone. He displays a negative Romberg sign. Coordination abnormal. Gait normal.  Skin: Skin is warm and dry. No rash noted.  Psychiatric: He has a normal mood and affect. His behavior is normal. Judgment and thought content normal.  in a w/c LE w/1+ edema L thigh is tender and much bigger than the R  Lab Results  Component Value Date   WBC 5.9 12/28/2017   HGB 8.7 (L) 12/28/2017   HCT 27.7 (L) 12/28/2017  PLT 115 (L) 12/28/2017   GLUCOSE 101 (H) 12/28/2017   CHOL 104 08/24/2017   TRIG 68.0 08/24/2017   HDL 50.80 08/24/2017   LDLCALC 40 08/24/2017   ALT 8 12/23/2017   AST 24 12/23/2017   NA 130 (L) 12/28/2017   K 3.6 12/28/2017   CL 94 (L) 12/28/2017   CREATININE 1.29 (H) 12/28/2017   BUN 20 12/28/2017   CO2 28 12/28/2017   TSH 5.026 (H) 10/18/2017   INR 2.2 01/04/2018   HGBA1C 4.7 (L) 12/07/2017   MICROALBUR 6.4 (H) 10/15/2017    Dg Chest Portable 1 View  Result Date: 12/23/2017 CLINICAL DATA:  Weakness, fatigue EXAM: PORTABLE CHEST 1 VIEW COMPARISON:  10/24/2017 FINDINGS: Lungs are clear.  No pleural effusion or pneumothorax. Cardiomegaly. Prominent pulmonary markings in the bilateral lower lobes. Median sternotomy. IMPRESSION: No evidence of acute cardiopulmonary disease. Electronically Signed   By: Julian Hy M.D.   On: 12/23/2017 14:15    Assessment & Plan:   There  are no diagnoses linked to this encounter.   No orders of the defined types were placed in this encounter.    Follow-up: No follow-ups on file.  Walker Kehr, MD

## 2018-01-07 NOTE — Assessment & Plan Note (Signed)
CHF Clinic f/u Labs

## 2018-01-07 NOTE — Assessment & Plan Note (Signed)
CBC

## 2018-01-07 NOTE — Telephone Encounter (Signed)
Copied from [place] [id number]. Topic: Quick Communication - See Telephone Encounter >> Jan 07, 2018  9:12 AM Ahmed Prima L wrote: CRM for notification. See Telephone encounter for: 01/07/18.  Melisa, RN with [facility] called to get orders for re-certification for home health, he is still having some issues going and is not cleared for discharge. She can reached at [phone] and has a secured voicemail.

## 2018-01-07 NOTE — Assessment & Plan Note (Signed)
Probable hematoma L thigh 10/19 Labs [place]

## 2018-01-07 NOTE — Assessment & Plan Note (Signed)
Pacerone Coumadin Diuretics

## 2018-01-07 NOTE — Assessment & Plan Note (Signed)
UA

## 2018-01-08 ENCOUNTER — Ambulatory Visit: Payer: Self-pay | Admitting: Interventional Cardiology

## 2018-01-08 ENCOUNTER — Ambulatory Visit (INDEPENDENT_AMBULATORY_CARE_PROVIDER_SITE_OTHER)
Admission: RE | Admit: 2018-01-08 | Discharge: 2018-01-08 | Disposition: A | Discharge status: Home / Self Care | Payer: Medicare Other | Source: Ambulatory Visit | Attending: Family | Admitting: Family

## 2018-01-08 ENCOUNTER — Telehealth: Payer: Self-pay

## 2018-01-08 ENCOUNTER — Telehealth: Payer: Self-pay | Admitting: Cardiology

## 2018-01-08 ENCOUNTER — Other Ambulatory Visit: Payer: Self-pay | Admitting: Internal Medicine

## 2018-01-08 DIAGNOSIS — M79605 Pain in left leg: Secondary | ICD-10-CM | POA: Insufficient documentation

## 2018-01-08 DIAGNOSIS — D649 Anemia, unspecified: Secondary | ICD-10-CM | POA: Diagnosis not present

## 2018-01-08 DIAGNOSIS — Z952 Presence of prosthetic heart valve: Secondary | ICD-10-CM

## 2018-01-08 DIAGNOSIS — N179 Acute kidney failure, unspecified: Secondary | ICD-10-CM | POA: Diagnosis not present

## 2018-01-08 DIAGNOSIS — T83511D Infection and inflammatory reaction due to indwelling urethral catheter, subsequent encounter: Principal | ICD-10-CM

## 2018-01-08 DIAGNOSIS — R609 Edema, unspecified: Secondary | ICD-10-CM | POA: Insufficient documentation

## 2018-01-08 DIAGNOSIS — I4821 Permanent atrial fibrillation: Secondary | ICD-10-CM | POA: Diagnosis not present

## 2018-01-08 DIAGNOSIS — Z7189 Other specified counseling: Secondary | ICD-10-CM

## 2018-01-08 DIAGNOSIS — N183 Chronic kidney disease, stage 3 (moderate): Secondary | ICD-10-CM | POA: Diagnosis not present

## 2018-01-08 DIAGNOSIS — I130 Hypertensive heart and chronic kidney disease with heart failure and stage 1 through stage 4 chronic kidney disease, or unspecified chronic kidney disease: Secondary | ICD-10-CM | POA: Diagnosis not present

## 2018-01-08 DIAGNOSIS — N390 Urinary tract infection, site not specified: Secondary | ICD-10-CM

## 2018-01-08 DIAGNOSIS — I5033 Acute on chronic diastolic (congestive) heart failure: Secondary | ICD-10-CM | POA: Diagnosis not present

## 2018-01-08 DIAGNOSIS — R791 Abnormal coagulation profile: Secondary | ICD-10-CM | POA: Diagnosis not present

## 2018-01-08 DIAGNOSIS — E871 Hypo-osmolality and hyponatremia: Secondary | ICD-10-CM | POA: Diagnosis not present

## 2018-01-08 DIAGNOSIS — E1122 Type 2 diabetes mellitus with diabetic chronic kidney disease: Secondary | ICD-10-CM | POA: Diagnosis not present

## 2018-01-08 DIAGNOSIS — S8012XA Contusion of left lower leg, initial encounter: Secondary | ICD-10-CM | POA: Diagnosis not present

## 2018-01-08 DIAGNOSIS — I2510 Atherosclerotic heart disease of native coronary artery without angina pectoris: Secondary | ICD-10-CM | POA: Diagnosis not present

## 2018-01-08 DIAGNOSIS — I4811 Longstanding persistent atrial fibrillation: Secondary | ICD-10-CM

## 2018-01-08 DIAGNOSIS — D62 Acute posthemorrhagic anemia: Secondary | ICD-10-CM | POA: Diagnosis not present

## 2018-01-08 DIAGNOSIS — I5032 Chronic diastolic (congestive) heart failure: Secondary | ICD-10-CM | POA: Diagnosis not present

## 2018-01-08 LAB — PROTIME-INR: INR: 10 — AB (ref 0.9–1.1)

## 2018-01-08 MED ORDER — CEFUROXIME AXETIL 250 MG PO TABS: 250.0000 mg | ORAL_TABLET | Freq: Two times a day (BID) | ORAL | 0 refills | Status: DC

## 2018-01-08 NOTE — Telephone Encounter (Signed)
Received page from home health.  They were notified from [facility] that his INR was greater than 10, hemoglobin of 7.1.  I called the patient's home, and was able to speak to his wife.  He has not had any bleeding symptoms.  He has had swelling of his left thigh and had an ultrasound of this today.  This showed no evident no evidence of DVT or hematoma.  He has an appointment tomorrow in the heart failure clinic.  In the interim, I advised him to avoid any significant exertion, and take extreme care to avoid any falls.  They know to present to the emergency department if he has any bleeding symptoms whatsoever, or if he has a fall, or head strike.  Doylene Canning, MD

## 2018-01-08 NOTE — Telephone Encounter (Signed)
FYI

## 2018-01-08 NOTE — Telephone Encounter (Signed)
[name] with [facility] called with report of Left Lower Extremity study - "No acute DVT visualized."

## 2018-01-09 ENCOUNTER — Ambulatory Visit (HOSPITAL_BASED_OUTPATIENT_CLINIC_OR_DEPARTMENT_OTHER)
Admission: RE | Admit: 2018-01-09 | Discharge: 2018-01-09 | Disposition: A | Discharge status: Home / Self Care | Payer: Medicare Other | Source: Ambulatory Visit | Attending: Internal Medicine | Admitting: Internal Medicine

## 2018-01-09 ENCOUNTER — Emergency Department (HOSPITAL_COMMUNITY): Payer: Medicare Other

## 2018-01-09 ENCOUNTER — Encounter (HOSPITAL_COMMUNITY): Payer: Self-pay | Admitting: *Deleted

## 2018-01-09 ENCOUNTER — Inpatient Hospital Stay (HOSPITAL_COMMUNITY)
Admission: EM | Admit: 2018-01-09 | Discharge: 2018-01-18 | DRG: 812 | Disposition: A | Discharge status: Rehabilitation Facility | Payer: Medicare Other | Attending: Internal Medicine | Admitting: Internal Medicine

## 2018-01-09 ENCOUNTER — Ambulatory Visit: Payer: Medicare Other | Admitting: Cardiology

## 2018-01-09 VITALS — BP 108/72

## 2018-01-09 DIAGNOSIS — I5032 Chronic diastolic (congestive) heart failure: Secondary | ICD-10-CM | POA: Diagnosis present

## 2018-01-09 DIAGNOSIS — E538 Deficiency of other specified B group vitamins: Secondary | ICD-10-CM | POA: Diagnosis present

## 2018-01-09 DIAGNOSIS — Z8249 Family history of ischemic heart disease and other diseases of the circulatory system: Secondary | ICD-10-CM

## 2018-01-09 DIAGNOSIS — Z9981 Dependence on supplemental oxygen: Secondary | ICD-10-CM | POA: Diagnosis not present

## 2018-01-09 DIAGNOSIS — I4821 Permanent atrial fibrillation: Secondary | ICD-10-CM | POA: Diagnosis present

## 2018-01-09 DIAGNOSIS — M7989 Other specified soft tissue disorders: Secondary | ICD-10-CM | POA: Diagnosis not present

## 2018-01-09 DIAGNOSIS — J9611 Chronic respiratory failure with hypoxia: Secondary | ICD-10-CM | POA: Diagnosis present

## 2018-01-09 DIAGNOSIS — I50812 Chronic right heart failure: Secondary | ICD-10-CM

## 2018-01-09 DIAGNOSIS — E785 Hyperlipidemia, unspecified: Secondary | ICD-10-CM | POA: Diagnosis present

## 2018-01-09 DIAGNOSIS — I252 Old myocardial infarction: Secondary | ICD-10-CM

## 2018-01-09 DIAGNOSIS — Z809 Family history of malignant neoplasm, unspecified: Secondary | ICD-10-CM

## 2018-01-09 DIAGNOSIS — E876 Hypokalemia: Secondary | ICD-10-CM | POA: Diagnosis present

## 2018-01-09 DIAGNOSIS — M7981 Nontraumatic hematoma of soft tissue: Secondary | ICD-10-CM | POA: Diagnosis not present

## 2018-01-09 DIAGNOSIS — D649 Anemia, unspecified: Secondary | ICD-10-CM

## 2018-01-09 DIAGNOSIS — R319 Hematuria, unspecified: Secondary | ICD-10-CM | POA: Diagnosis present

## 2018-01-09 DIAGNOSIS — I130 Hypertensive heart and chronic kidney disease with heart failure and stage 1 through stage 4 chronic kidney disease, or unspecified chronic kidney disease: Secondary | ICD-10-CM

## 2018-01-09 DIAGNOSIS — R600 Localized edema: Secondary | ICD-10-CM | POA: Diagnosis not present

## 2018-01-09 DIAGNOSIS — D62 Acute posthemorrhagic anemia: Principal | ICD-10-CM

## 2018-01-09 DIAGNOSIS — E1151 Type 2 diabetes mellitus with diabetic peripheral angiopathy without gangrene: Secondary | ICD-10-CM | POA: Diagnosis present

## 2018-01-09 DIAGNOSIS — Z888 Allergy status to other drugs, medicaments and biological substances status: Secondary | ICD-10-CM

## 2018-01-09 DIAGNOSIS — Z7901 Long term (current) use of anticoagulants: Secondary | ICD-10-CM

## 2018-01-09 DIAGNOSIS — Z951 Presence of aortocoronary bypass graft: Secondary | ICD-10-CM

## 2018-01-09 DIAGNOSIS — I959 Hypotension, unspecified: Secondary | ICD-10-CM | POA: Diagnosis not present

## 2018-01-09 DIAGNOSIS — L89152 Pressure ulcer of sacral region, stage 2: Secondary | ICD-10-CM | POA: Diagnosis present

## 2018-01-09 DIAGNOSIS — Z79899 Other long term (current) drug therapy: Secondary | ICD-10-CM

## 2018-01-09 DIAGNOSIS — M79605 Pain in left leg: Secondary | ICD-10-CM

## 2018-01-09 DIAGNOSIS — M109 Gout, unspecified: Secondary | ICD-10-CM | POA: Diagnosis present

## 2018-01-09 DIAGNOSIS — I340 Nonrheumatic mitral (valve) insufficiency: Secondary | ICD-10-CM | POA: Diagnosis not present

## 2018-01-09 DIAGNOSIS — Z801 Family history of malignant neoplasm of trachea, bronchus and lung: Secondary | ICD-10-CM | POA: Diagnosis not present

## 2018-01-09 DIAGNOSIS — R5381 Other malaise: Secondary | ICD-10-CM | POA: Diagnosis not present

## 2018-01-09 DIAGNOSIS — I25810 Atherosclerosis of coronary artery bypass graft(s) without angina pectoris: Secondary | ICD-10-CM | POA: Diagnosis not present

## 2018-01-09 DIAGNOSIS — N183 Chronic kidney disease, stage 3 (moderate): Secondary | ICD-10-CM | POA: Insufficient documentation

## 2018-01-09 DIAGNOSIS — Z87891 Personal history of nicotine dependence: Secondary | ICD-10-CM

## 2018-01-09 DIAGNOSIS — R791 Abnormal coagulation profile: Secondary | ICD-10-CM | POA: Diagnosis present

## 2018-01-09 DIAGNOSIS — I4891 Unspecified atrial fibrillation: Secondary | ICD-10-CM | POA: Diagnosis not present

## 2018-01-09 DIAGNOSIS — S8012XA Contusion of left lower leg, initial encounter: Secondary | ICD-10-CM | POA: Diagnosis present

## 2018-01-09 DIAGNOSIS — T148XXA Other injury of unspecified body region, initial encounter: Secondary | ICD-10-CM | POA: Diagnosis not present

## 2018-01-09 DIAGNOSIS — E1122 Type 2 diabetes mellitus with diabetic chronic kidney disease: Secondary | ICD-10-CM | POA: Insufficient documentation

## 2018-01-09 DIAGNOSIS — Z952 Presence of prosthetic heart valve: Secondary | ICD-10-CM

## 2018-01-09 DIAGNOSIS — I2510 Atherosclerotic heart disease of native coronary artery without angina pectoris: Secondary | ICD-10-CM | POA: Diagnosis present

## 2018-01-09 DIAGNOSIS — N189 Chronic kidney disease, unspecified: Secondary | ICD-10-CM | POA: Diagnosis present

## 2018-01-09 DIAGNOSIS — I5030 Unspecified diastolic (congestive) heart failure: Secondary | ICD-10-CM | POA: Diagnosis not present

## 2018-01-09 DIAGNOSIS — I482 Chronic atrial fibrillation, unspecified: Secondary | ICD-10-CM

## 2018-01-09 DIAGNOSIS — R609 Edema, unspecified: Secondary | ICD-10-CM

## 2018-01-09 DIAGNOSIS — R7989 Other specified abnormal findings of blood chemistry: Secondary | ICD-10-CM | POA: Diagnosis not present

## 2018-01-09 DIAGNOSIS — E871 Hypo-osmolality and hyponatremia: Secondary | ICD-10-CM | POA: Diagnosis present

## 2018-01-09 DIAGNOSIS — S7012XA Contusion of left thigh, initial encounter: Secondary | ICD-10-CM | POA: Diagnosis present

## 2018-01-09 DIAGNOSIS — J449 Chronic obstructive pulmonary disease, unspecified: Secondary | ICD-10-CM | POA: Diagnosis present

## 2018-01-09 DIAGNOSIS — E1165 Type 2 diabetes mellitus with hyperglycemia: Secondary | ICD-10-CM | POA: Diagnosis not present

## 2018-01-09 DIAGNOSIS — D709 Neutropenia, unspecified: Secondary | ICD-10-CM | POA: Diagnosis present

## 2018-01-09 DIAGNOSIS — N179 Acute kidney failure, unspecified: Secondary | ICD-10-CM

## 2018-01-09 DIAGNOSIS — Z881 Allergy status to other antibiotic agents status: Secondary | ICD-10-CM

## 2018-01-09 DIAGNOSIS — G4733 Obstructive sleep apnea (adult) (pediatric): Secondary | ICD-10-CM | POA: Diagnosis present

## 2018-01-09 DIAGNOSIS — I110 Hypertensive heart disease with heart failure: Secondary | ICD-10-CM | POA: Diagnosis present

## 2018-01-09 DIAGNOSIS — L8990 Pressure ulcer of unspecified site, unspecified stage: Secondary | ICD-10-CM | POA: Diagnosis present

## 2018-01-09 DIAGNOSIS — D696 Thrombocytopenia, unspecified: Secondary | ICD-10-CM | POA: Diagnosis present

## 2018-01-09 DIAGNOSIS — T502X5A Adverse effect of carbonic-anhydrase inhibitors, benzothiadiazides and other diuretics, initial encounter: Secondary | ICD-10-CM | POA: Diagnosis not present

## 2018-01-09 LAB — CBC WITH DIFFERENTIAL/PLATELET
Basophils Absolute: 0.3 10*3/uL — ABNORMAL HIGH (ref 0.0–0.1)
Basophils Relative: 2 %
EOS PCT: 1 %
Eosinophils Absolute: 0.1 10*3/uL (ref 0.0–0.5)
HEMATOCRIT: 17.7 % — AB (ref 39.0–52.0)
HEMOGLOBIN: 5.6 g/dL — AB (ref 13.0–17.0)
LYMPHS PCT: 4 %
Lymphs Abs: 0.5 10*3/uL — ABNORMAL LOW (ref 0.7–4.0)
MCH: 31.6 pg (ref 26.0–34.0)
MCHC: 31.6 g/dL (ref 30.0–36.0)
MCV: 100 fL (ref 80.0–100.0)
MONO ABS: 0.5 10*3/uL (ref 0.1–1.0)
Monocytes Relative: 4 %
Neutro Abs: 11.4 10*3/uL — ABNORMAL HIGH (ref 1.7–7.7)
Neutrophils Relative %: 89 %
PLATELETS: 248 10*3/uL (ref 150–400)
RBC: 1.77 MIL/uL — AB (ref 4.22–5.81)
RDW: 21.7 % — ABNORMAL HIGH (ref 11.5–15.5)
WBC: 12.8 10*3/uL — AB (ref 4.0–10.5)
nRBC: 0 /100 WBC
nRBC: 0.3 % — ABNORMAL HIGH (ref 0.0–0.2)

## 2018-01-09 LAB — BASIC METABOLIC PANEL
Anion gap: 13 (ref 5–15)
Anion gap: 13 (ref 5–15)
BUN: 44 mg/dL — AB (ref 8–23)
BUN: 46 mg/dL — AB (ref 8–23)
CHLORIDE: 94 mmol/L — AB (ref 98–111)
CO2: 23 mmol/L (ref 22–32)
CO2: 23 mmol/L (ref 22–32)
CREATININE: 1.85 mg/dL — AB (ref 0.61–1.24)
CREATININE: 1.85 mg/dL — AB (ref 0.61–1.24)
Calcium: 8.5 mg/dL — ABNORMAL LOW (ref 8.9–10.3)
Calcium: 8.7 mg/dL — ABNORMAL LOW (ref 8.9–10.3)
Chloride: 93 mmol/L — ABNORMAL LOW (ref 98–111)
GFR calc Af Amer: 38 mL/min — ABNORMAL LOW (ref >60)
GFR calc Af Amer: 38 mL/min — ABNORMAL LOW (ref >60)
GFR calc non Af Amer: 33 mL/min — ABNORMAL LOW (ref >60)
GFR, EST NON AFRICAN AMERICAN: 33 mL/min — AB (ref >60)
GLUCOSE: 121 mg/dL — AB (ref 70–99)
Glucose, Bld: 127 mg/dL — ABNORMAL HIGH (ref 70–99)
POTASSIUM: 5.4 mmol/L — AB (ref 3.5–5.1)
Potassium: 5.3 mmol/L — ABNORMAL HIGH (ref 3.5–5.1)
SODIUM: 130 mmol/L — AB (ref 135–145)
Sodium: 129 mmol/L — ABNORMAL LOW (ref 135–145)

## 2018-01-09 LAB — CBC
HEMATOCRIT: 17.1 % — AB (ref 39.0–52.0)
HEMOGLOBIN: 5.3 g/dL — AB (ref 13.0–17.0)
MCH: 30.8 pg (ref 26.0–34.0)
MCHC: 31 g/dL (ref 30.0–36.0)
MCV: 99.4 fL (ref 80.0–100.0)
NRBC: 0.2 % (ref 0.0–0.2)
Platelets: 253 10*3/uL (ref 150–400)
RBC: 1.72 MIL/uL — AB (ref 4.22–5.81)
RDW: 21.4 % — ABNORMAL HIGH (ref 11.5–15.5)
WBC: 12.9 10*3/uL — ABNORMAL HIGH (ref 4.0–10.5)

## 2018-01-09 LAB — URINALYSIS, ROUTINE W REFLEX MICROSCOPIC
Bilirubin Urine: NEGATIVE
GLUCOSE, UA: NEGATIVE mg/dL
KETONES UR: NEGATIVE mg/dL
NITRITE: NEGATIVE
PH: 6 (ref 5.0–8.0)
PROTEIN: NEGATIVE mg/dL
Specific Gravity, Urine: 1.011 (ref 1.005–1.030)

## 2018-01-09 LAB — POC OCCULT BLOOD, ED: FECAL OCCULT BLD: NEGATIVE

## 2018-01-09 LAB — PROTIME-INR
INR: 3.93
INR: 3.71
Prothrombin Time: 38.2 seconds — ABNORMAL HIGH (ref 11.4–15.2)
Prothrombin Time: 36.5 seconds — ABNORMAL HIGH (ref 11.4–15.2)

## 2018-01-09 LAB — APTT: aPTT: 95 seconds — ABNORMAL HIGH (ref 24–36)

## 2018-01-09 LAB — PREPARE RBC (CROSSMATCH)

## 2018-01-09 MED ORDER — SODIUM CHLORIDE 0.9 % IV SOLN
10.0000 mL/h | Freq: Once | INTRAVENOUS | Status: AC
  Administered 2018-01-10: 10 mL/h via INTRAVENOUS

## 2018-01-09 MED ORDER — ONDANSETRON HCL 4 MG PO TABS: 4.0000 mg | ORAL_TABLET | Freq: Four times a day (QID) | ORAL | Status: DC | PRN

## 2018-01-09 MED ORDER — AMIODARONE HCL 200 MG PO TABS
200.0000 mg | ORAL_TABLET | Freq: Every day | ORAL | Status: DC
  Administered 2018-01-10 – 2018-01-18 (×9): 200 mg via ORAL
  Filled 2018-01-09 (×10): qty 1

## 2018-01-09 MED ORDER — PHYTONADIONE 5 MG PO TABS: 2.5000 mg | ORAL_TABLET | Freq: Once | ORAL | 0 refills | Status: AC

## 2018-01-09 MED ORDER — SODIUM CHLORIDE 0.9% IV SOLUTION: Freq: Once | INTRAVENOUS | Status: DC

## 2018-01-09 MED ORDER — ONDANSETRON HCL 4 MG/2ML IJ SOLN: 4.0000 mg | Freq: Four times a day (QID) | INTRAMUSCULAR | Status: DC | PRN

## 2018-01-09 MED ORDER — SODIUM CHLORIDE 0.9 % IV SOLN: 250.0000 mL | INTRAVENOUS | Status: DC | PRN

## 2018-01-09 MED ORDER — INSULIN ASPART 100 UNIT/ML ~~LOC~~ SOLN
0.0000 [IU] | Freq: Three times a day (TID) | SUBCUTANEOUS | Status: DC
  Administered 2018-01-10 – 2018-01-15 (×6): 1 [IU] via SUBCUTANEOUS
  Administered 2018-01-15: 2 [IU] via SUBCUTANEOUS

## 2018-01-09 MED ORDER — SODIUM CHLORIDE 0.9% FLUSH
3.0000 mL | INTRAVENOUS | Status: DC | PRN
  Administered 2018-01-09: 3 mL via INTRAVENOUS

## 2018-01-09 MED ORDER — SODIUM CHLORIDE 0.9% FLUSH
3.0000 mL | Freq: Two times a day (BID) | INTRAVENOUS | Status: DC
  Administered 2018-01-09 – 2018-01-18 (×18): 3 mL via INTRAVENOUS

## 2018-01-09 NOTE — ED Notes (Signed)
ED Provider at bedside. 

## 2018-01-09 NOTE — Progress Notes (Signed)
Advanced Heart Failure Clinic Note   PCP: [name], [name], MD PCP-Cardiologist: [name], MD  HF: [name]  HPI: [name] is a 82 y.o. male with h/o mechanical AVR 1994, OSA on CPAP, Chronic afib, chronic combined CHF, CKD III, chronic coumadin therapy, CAD s/p CAGB 1994, and COPD.   Admitted 7/18-11/05/17 from [facility] office with A/C diastolic HF. Diuresed 83 lbs with lasix drip, metolazone, and diamox. Transitioned to torsemide 80 mg BID. Determined not to be a candidate for MitraClip. Dr [name] also consulted and recommended aggressive medical therapy versus possible experimental percutaneous MVR as he felt very high risk for conventional surgery. Coarse complicated by drop in hemoglobin and symptomatic hypotension, requiring 1 unit pRBCs. GI consulted and did not recommend scoping. BB and spiro were stopped due to low BPs. Had some AKI assoicated with low BP, but resolved by discharge. Required lovenox bridge at DC with subtherapeutic INR. Discharged with [facility]. DC weight: 216 lbs.   Admitted 9/11 - 12/14/17 for dental extractions in setting of work up for MVR. Hospital course complicated by run of VT in the setting of hypokalemia. Supped, and started on amiodarone with h/o of arrhythmia. Bridged with lovenox.   Admitted 9/22 - 12/28/17 with symptomatic anemia from bleeding gums. Bleeding stopped with holding heparin and coumadin. Received multiple units of blood.   He presents today for post hospital follow up. He feels too week to stand and has continued swelling in his left leg. INR > 10 yesterday. Given dose of vitamin K this am. Hgb 7.1. He denies any overt bleeding, but is concerned about the swelling in his leg. He did not take metolazone Monday, as he has felt too weak to stand and worried about falling going to the bathroom. He states the pain in his thigh started when he was in the shower Friday, as an ache. The swelling in his feet has improved as his Left thigh swelling  has worsened. Denies bruising or swelling into his testicles. No bleeding from his gums, melena, or BRBPR. He has been unable to stand to weight at home. No CP or palpitations.   Review of systems complete and found to be negative unless listed in HPI.    Past Medical History:  Diagnosis Date  . Carotid artery disease ([place]) 1994   s/p left carotid endarerectomy   . Chronic atrial fibrillation    a. on coumadin   . Chronic diastolic CHF (congestive heart failure) ([place])   . Diabetes mellitus without complication ([place])    dx 2016  . Dyspnea   . Heart murmur   . Hx of CABG    a. 1994  . Hypertension   . OSA (obstructive sleep apnea)   . Rheumatic fever   . S/P AVR (aortic valve replacement)    a. mechanical valve 1996  . Subclavian bypass stenosis ([place])   . 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 Vitamin B12 deficiency     Current Outpatient Medications  Medication Sig Dispense Refill  . allopurinol (ZYLOPRIM) 100 MG tablet Take 1 tablet (100 mg total) by mouth daily. 30 tablet 3  . amiodarone (PACERONE) 200 MG tablet Take 1 tablet (200 mg total) by mouth daily. 60 tablet 3  . cefUROXime (CEFTIN) 250 MG tablet Take 1 tablet (250 mg total) by mouth 2 (two) times daily for 14 days. 14 tablet 0  . enoxaparin (LOVENOX) 100 MG/ML injection Inject  1 mL (100 mg total) into the skin every 12 (twelve) hours. 10 Syringe 1  . ezetimibe-simvastatin (VYTORIN) 10-20 MG tablet Take 1 tablet by mouth at bedtime. 90 tablet 3  . metolazone (ZAROXOLYN) 2.5 MG tablet Take 1 tablet (2.5 mg total) by mouth every Monday. 10 tablet 0  . phytonadione (MEPHYTON) 5 MG tablet Take 0.5 tablets (2.5 mg total) by mouth once for 1 dose. 1 tablet 0  . Potassium Chloride ER 20 MEQ TBCR Take 40 mEq by mouth daily. PLEASE TAKE EXTRA DOSE EVERY MONDAY 60 tablet 0  . torsemide (DEMADEX) 100 MG tablet Take 1 tablet (100 mg total) by mouth 2 (two) times daily. 60  tablet 3  . warfarin (COUMADIN) 5 MG tablet Take 1 tablet (5 mg total) by mouth daily at 6 PM. 20 tablet 0   No current facility-administered medications for this encounter.    Allergies  Allergen Reactions  . Rifampin Rash    May have been caused by Vancomycin or Rifampin (??)  . Vancomycin Rash    May have been caused by Vancomycin or Rifampin (??)   Social History   Socioeconomic History  . Marital status: Married    Spouse name: Not on file  . Number of children: 3  . Years of education: Not on file  . Highest education level: Not on file  Occupational History  . Not on file  Social Needs  . Financial resource strain: Not on file  . Food insecurity:    Worry: Not on file    Inability: Not on file  . Transportation needs:    Medical: Not on file    Non-medical: Not on file  Tobacco Use  . Smoking status: Former Smoker    Packs/day: 1.00    Years: 30.00    Pack years: 30.00    Types: Cigarettes    Last attempt to quit: 04/03/1992    Years since quitting: 25.7  . Smokeless tobacco: Never Used  Substance and Sexual Activity  . Alcohol use: Yes    Comment: 4-6 ounces of wine daily  . Drug use: No    Comment: Half a cup a day.  Marland Kitchen Sexual activity: Not on file  Lifestyle  . Physical activity:    Days per week: Not on file    Minutes per session: Not on file  . Stress: Not on file  Relationships  . Social connections:    Talks on phone: Not on file    Gets together: Not on file    Attends religious service: Not on file    Active member of club or organization: Not on file    Attends meetings of clubs or organizations: Not on file    Relationship status: Not on file  . Intimate partner violence:    Fear of current or ex partner: No    Emotionally abused: No    Physically abused: No    Forced sexual activity: No  Other Topics Concern  . Not on file  Social History Narrative   Patient is married and lives with his wife.   Youngest daughter lives in [place]  also.   Patient has 2 other children.    Family History  Problem Relation Age of Onset  . Cancer Father        lung   . Hypertension Mother    Vitals:   01/09/18 1338  BP: 108/72     Wt Readings from Last 3 Encounters:  01/07/18 94.8 kg (209  lb)  12/28/17 98.6 kg (217 lb 6 oz)  12/20/17 100.8 kg (222 lb 4.8 oz)   PHYSICAL EXAM: General: Elderly and fatigued appearing. No resp difficulty. HEENT: Normal Neck: Supple. JVP 6-7 cm. Carotids 2+ bilat; no bruits. No thyromegaly or nodule noted. Cor: PMI nondisplaced. IRR, Mechanical S1, 2/6 HSM at apex.   Lungs: CTAB, normal effort. Abdomen: Soft, non-tender, non-distended, no HSM. No bruits or masses. +BS  Extremities: No cyanosis, clubbing, or rash. R leg with trace ankle edema. L leg with 2-3+ edema into groin. Slightly cool to the touch.  Neuro: Alert & orientedx3, cranial nerves grossly intact. moves all 4 extremities w/o difficulty. Affect pleasant   ASSESSMENT & PLAN:  1. Marked thigh edema - In setting of INR 10 yesterday. Given dose of vitamin K this am.  - [place] 01/08/18 negative for DVT or hematoma, but with significant edema - Suspect he is bleeding into this legs. He can flex his foot, but if continues to worse have some concern could lead to compartment syndrome.  2. Chronic diastolic CHF with prominent RV failure:  Echo 10/2017: EF 55-60%, mechanical aortic valve functioning normally, mild mitral stenosis/severe mitral regurgitation, RV moderately dilated with moderately decreased systolic function, PASP 49 mmHg.  Severe MR could be contributing to the significant RV failure.   - NYHA III-IIIb symptoms chronically. Confounded by deconditioning.  - His central volume status looks OK, possible dry with low BP and anemia, though his left leg has significant anemia in the setting of possible bleed.   - Continue torsemide 100 mg BID chronically, may need to hold for now in setting of ? Bleeding. Will send to ED.   - Continue  spiro 12.5 mg qHS.  - Continue unna boots through [facility].  - Discussed limiting fluid and salt intake.  3. Mechanical aortic valve: Appears to function well on echo.  - Continue warfarin with INR goal 2.5-3.5 (also with atrial fibrillation).  - INR elevated 01/09/18. Coumadin clinic managing.  4. Atrial fibrillation: Chronic.   - Rate controlled.  5. CAD: s/p CABG.  - No s/s of ischemia.    - Continue Vytorin.  6. COPD:  - Continue home oxygen. Continue humidifier.No change.  7. OSA:  - Continue nightly BiPAP.  8. AKI on CKD: Stage 3.   - Cr 1.29 -> 1.51 01/07/18. Follow.  9. Anemia:  - Required blood transfusion while inpatient. GI saw. Guaiac negative. Scope not indicated. - Continue Protonix. Denies bleeding.  - Hgb 7.1 01/07/18. INR elevated as above. Sending to ED, if low suspect will need transfusion.  - [place] of L thigh yesterday with no obvious hematoma.  10. Thrombocytopenia - Chronic.   11. 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  - Structural Heart team evaluated. Not a Mitraclip candidate. - Plan to follow up with Dr. [name] for further.  12. Gout:  - Continue allopurinol 100 mg daily.  - Per PCP.  13. Dental Extractions - s/p multiple extractions.  - Off lovenox.  - Stable. No further bleeding.   Will send urgently to ED for further assessment of possible Left thigh bleed. Will need CT thigh and possible pelvis. Clot less likely with coumadin and supratherapeutic INR.   Shirley Friar, PA-C 01/09/18   Patient seen and examined with the above-signed Advanced Practice Provider and/or Housestaff. I personally reviewed laboratory data, imaging studies  and relevant notes. I independently examined the patient and formulated the important aspects of the plan. I have edited the note to reflect any of my changes or salient points. I have personally discussed the  plan with the patient and/or family.  Left leg exam markedly edematous with limited range of motion. With recently elevated INR very concerning for massive bleed with concern for risk of compartment syndrome. Will send urgently to ER and will likely need to be admitted. Hold all anti-coagulants.   Time spent 35 minutes  Glori Bickers, MD  4:05 PM

## 2018-01-09 NOTE — ED Notes (Signed)
Admitting at bedside 

## 2018-01-09 NOTE — ED Triage Notes (Signed)
Pt in c/o lower hgb and elevated INR, also left lower leg swelling, had a DVT study yesterday that was negative

## 2018-01-09 NOTE — H&P (Signed)
[facility]                                                                                    Patient Demographics  [name], is a 82 y.o. male  CSN: [id number]  MRN: [id number]  DOB - 01-22-1936  Admit Date - 01/09/2018  Outpatient Primary MD for the patient is [name], [name], MD   With History of -  Past Medical History:  Diagnosis Date  . Carotid artery disease ([place]) 1994   s/p left carotid endarerectomy   . Chronic atrial fibrillation    a. on coumadin   . Chronic diastolic CHF (congestive heart failure) ([place])   . Diabetes mellitus without complication ([place])    dx 2016  . Dyspnea   . Heart murmur   . Hx of CABG    a. 1994  . Hypertension   . OSA (obstructive sleep apnea)   . Rheumatic fever   . S/P AVR (aortic valve replacement)    a. mechanical valve 1996  . Subclavian bypass stenosis ([place])   . 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 Vitamin B12 deficiency       Past Surgical History:  Procedure Laterality Date  . [place]   replaced due to aortic stenosis, [employer] mechanical prostesis  . CAROTID ENDARTERECTOMY Left 1997   subclavian bypass Done in [place]  . CORONARY ARTERY BYPASS GRAFT  1994   w SVG to RCA and SVG to circumflex  . heart bypass     Done in [place]  . MULTIPLE EXTRACTIONS WITH ALVEOLOPLASTY Bilateral 12/12/2017   Procedure: Extraction of tooth #'s 1, 12,13,14,15,17,18,19, 29, and 30 with alveoloplasty and gross debridement of remaining teeth`;  Surgeon: Lenn Cal, DDS;  Location: [place];  Service: Oral Surgery;  Laterality: Bilateral;  MULTIPLE EXTRACTION WITH ALVEOLOPLASTY WITH PRE PROSTHETIC SURGERY AND GROSS DEBRIDEMENT OF REMAINING TEETH  . TEE WITHOUT CARDIOVERSION N/A 10/23/2017   Procedure: TRANSESOPHAGEAL ECHOCARDIOGRAM (TEE);  Surgeon: Larey Dresser, MD;  Location: [facility] ENDOSCOPY;  Service: Cardiovascular;  Laterality:  N/A;  . TONSILLECTOMY      in for   Chief Complaint  Patient presents with  . Abnormal Lab     HPI  [name]  is a 82 y.o. male, with past medical history significant for valvular heart disease status post mechanical ventilation in 1994, coronary artery disease status post CABG and chronic A. fib on chronic anticoagulation on Lovenox and Coumadin presenting with symptomatic anemia with shortness of breath weakness and left leg swelling.  The patient was admitted on 9/22 for symptomatic anemia after tooth extraction on 9/11 and he was transfused.  In the last 2 days he was followed by his primary medical doctor and cardiologist for an elevated INR and he took vitamin K half tablet today for an INR of 10 and a hemoglobin of 7.  She had left leg ultrasound which showed no DVT or hematoma yesterday.  In the emergency room today his hemoglobin was 5.6 with an INR of 3.8.  His left leg is severely swollen with ecchymosis.  CT scan of the pelvis and left leg were ordered and are still pending.  Dr. Ivin Poot from orthopedics was called to consult .  Packed RBC and FFP were being prepared for blood transfusion.  Patient main complaint is left leg swelling and pain and shortness of breath.  He lives at home and his wife takes care of him.  Patient was treated for UTI recently with Keflex and he was noted to have hematuria as well.    Review of Systems    In addition to the HPI above, No Fever-chills, No Headache, No changes with Vision or hearing, No problems swallowing food or Liquids, No Chest pain, No Abdominal pain, No Nausea or Vommitting, Bowel movements are regular, No visible blood in stool or Urine, No dysuria, No new skin rashes or bruises, No new joints pains-aches,  No new weakness, tingling, numbness in any extremity, No recent weight gain or loss, No polyuria, polydypsia or polyphagia, No significant Mental Stressors.  A full 10 point Review of Systems was done, except as  stated above, all other Review of Systems were negative.   Social History Social History   Tobacco Use  . Smoking status: Former Smoker    Packs/day: 1.00    Years: 30.00    Pack years: 30.00    Types: Cigarettes    Last attempt to quit: 04/03/1992    Years since quitting: 25.7  . Smokeless tobacco: Never Used  Substance Use Topics  . Alcohol use: Yes    Comment: 4-6 ounces of wine daily     Family History Family History  Problem Relation Age of Onset  . Cancer Father        lung   . Hypertension Mother      Prior to Admission medications   Medication Sig Start Date End Date Taking? Authorizing Provider  allopurinol (ZYLOPRIM) 100 MG tablet Take 1 tablet (100 mg total) by mouth daily. 12/05/17  Yes Georgiana Shore, NP  amiodarone (PACERONE) 200 MG tablet Take 1 tablet (200 mg total) by mouth daily. 12/28/17  Yes Elgergawy, Silver Huguenin, MD  cefUROXime (CEFTIN) 250 MG tablet Take 1 tablet (250 mg total) by mouth 2 (two) times daily for 14 days. 01/08/18 01/22/18 Yes [name], [name], MD  ezetimibe-simvastatin (VYTORIN) 10-20 MG tablet Take 1 tablet by mouth at bedtime. 04/05/17  Yes [name], [name], MD  phytonadione (MEPHYTON) 5 MG tablet Take 0.5 tablets (2.5 mg total) by mouth once for 1 dose. 01/09/18 01/09/18 Yes Belva Crome, MD  Potassium Chloride ER 20 MEQ TBCR Take 40 mEq by mouth daily. PLEASE TAKE EXTRA DOSE EVERY MONDAY 12/28/17  Yes Elgergawy, Silver Huguenin, MD  torsemide (DEMADEX) 100 MG tablet Take 1 tablet (100 mg total) by mouth 2 (two) times daily. 12/05/17  Yes Georgiana Shore, NP  enoxaparin (LOVENOX) 100 MG/ML injection Inject 1 mL (100 mg total) into the skin every 12 (twelve) hours. 01/04/18   Belva Crome, MD  metolazone (ZAROXOLYN) 2.5 MG tablet Take 1 tablet (2.5 mg total) by mouth every Monday. 12/31/17   Elgergawy, Silver Huguenin, MD  warfarin (COUMADIN) 5 MG tablet Take 1 tablet (5 mg total) by mouth daily at 6 PM. 12/28/17   Elgergawy, Silver Huguenin, MD    Allergies   Allergen Reactions  . Rifampin Rash    May have been caused by Vancomycin or Rifampin (??)  . Vancomycin Rash    May have been caused by Vancomycin or Rifampin (??)  Physical Exam  Vitals  Blood pressure (!) 107/57, pulse 87, temperature 98 F (36.7 C), temperature source Oral, resp. rate 15, SpO2 100 %.   1. General chronically ill, slightly anxious  2. Normal affect and insight, Not Suicidal or Homicidal, Awake Alert, Oriented X 3.  3. No F.N deficits, grossly, patient moving all extremities  4. Ears and Eyes appear Normal, Conjunctivae clear, PERRLA. Moist Oral Mucosa.  5. Supple Neck, No JVD, No cervical lymphadenopathy appriciated, No Carotid Bruits.  6. Symmetrical Chest wall movement, Good air movement bilaterally, CTAB.  7.  Irregularly irregular, click and systolic murmur heard  8. Positive Bowel Sounds, Abdomen Soft, Non tender, No organomegaly appriciated,No rebound -guarding or rigidity.  9.  No Cyanosis, Normal Skin Turgor, No Skin Rash or Bruise.  10. Good muscle tone, left thigh swelling with ecchymosis noted    Data Review  CBC Recent Labs  Lab 01/07/18 1539 01/09/18 1358 01/09/18 1435  WBC 8.7 12.9* 12.8*  HGB 7.1 Repeated and verified X2.* 5.3* 5.6*  HCT 20.5* 17.1* 17.7*  PLT 191.0 253 248  MCV 90.3 99.4 100.0  MCH  --  30.8 31.6  MCHC 34.8 31.0 31.6  RDW 20.9* 21.4* 21.7*  LYMPHSABS 0.9  --  0.5*  MONOABS 0.6  --  0.5  EOSABS 0.0  --  0.1  BASOSABS 0.1  --  0.3*   ------------------------------------------------------------------------------------------------------------------  Chemistries  Recent Labs  Lab 01/07/18 1539 01/09/18 1358 01/09/18 1435  NA 129* 129* 130*  K 4.9 5.4* 5.3*  CL 92* 93* 94*  CO2 30 23 23   GLUCOSE 125* 121* 127*  BUN 39* 44* 46*  CREATININE 1.51* 1.85* 1.85*  CALCIUM 8.8 8.5* 8.7*    ------------------------------------------------------------------------------------------------------------------ estimated creatinine clearance is 37 mL/min (A) (by C-G formula based on SCr of 1.85 mg/dL (H)). ------------------------------------------------------------------------------------------------------------------ No results for input(s): TSH, T4TOTAL, T3FREE, THYROIDAB in the last 72 hours.  Invalid input(s): FREET3   Coagulation profile Recent Labs  Lab 01/04/18 01/08/18 01/09/18 1358 01/09/18 1435  INR 2.2 10.0* 3.93 3.71   ------------------------------------------------------------------------------------------------------------------- No results for input(s): DDIMER in the last 72 hours. -------------------------------------------------------------------------------------------------------------------  Cardiac Enzymes No results for input(s): CKMB, TROPONINI, MYOGLOBIN in the last 168 hours.  Invalid input(s): CK ------------------------------------------------------------------------------------------------------------------ Invalid input(s): POCBNP   ---------------------------------------------------------------------------------------------------------------  Urinalysis    Component Value Date/Time   COLORURINE YELLOW 01/07/2018 [place] 01/07/2018 1539   LABSPEC 1.010 01/07/2018 1539   PHURINE 6.5 01/07/2018 1539   GLUCOSEU NEGATIVE 01/07/2018 1539   HGBUR LARGE (A) 01/07/2018 1539   BILIRUBINUR NEGATIVE 01/07/2018 1539   BILIRUBINUR Neg 08/14/2014 1051   KETONESUR NEGATIVE 01/07/2018 1539   PROTEINUR NEGATIVE 12/24/2017 0910   UROBILINOGEN 1.0 01/07/2018 1539   NITRITE POSITIVE (A) 01/07/2018 1539   LEUKOCYTESUR LARGE (A) 01/07/2018 1539    ----------------------------------------------------------------------------------------------------------------   Imaging results:   Dg Chest Port 1 View  Result Date:  01/09/2018 CLINICAL DATA:  Low hemoglobin, pre transfusion EXAM: PORTABLE CHEST 1 VIEW COMPARISON:  December 23, 2017 FINDINGS: The heart size and mediastinal contours are stable. The heart size is enlarged. Patient status post prior median sternotomy. Both lungs are clear. The visualized skeletal structures are unremarkable. IMPRESSION: Cardiomegaly.  The lungs are clear. Electronically Signed   By: Abelardo Diesel M.D.   On: 01/09/2018 17:44   Dg Chest Portable 1 View  Result Date: 12/23/2017 CLINICAL DATA:  Weakness, fatigue EXAM: PORTABLE CHEST 1 VIEW COMPARISON:  10/24/2017 FINDINGS: Lungs are clear.  No pleural effusion  or pneumothorax. Cardiomegaly. Prominent pulmonary markings in the bilateral lower lobes. Median sternotomy. IMPRESSION: No evidence of acute cardiopulmonary disease. Electronically Signed   By: Julian Hy M.D.   On: 12/23/2017 14:15    Assessment & Plan   This unfortunate 82 year old gentleman with history of mechanical valve and A. fib on chronic anticoagulation is presenting today with recurrent symptomatic anemia and left leg swelling, negative for DVT.  There is concern about bleeding in his thigh and CT is still pending.  Patient was admitted on 9/22 after dental procedure with bleeding.  1.  Anemia, not sure of source.  Probably multifactorial with hematuria.  Stool guaiac is still pending.  Patient may be bleeding into his left thigh.  CT of thigh is pending.     Transfusion  2.  Coagulopathy on chronic anticoagulation     We will start FFP although patient has valvular heart disease due to the severe anemia and possible left thigh bleeding.  3.  Left thigh swelling/rule out for DVT     Check CT-still pending  4.  A. fib continue with amiodarone, on chronic anticoagulation  5.  Coronary artery disease status post CABG in 1994  6.  Valvular heart disease status post mechanical valve on 1994  7.  Diabetes mellitus type 2, start insulin sliding scale  8.   History of diastolic congestive heart failure on torsemide and Zaroxolyn which were placed on hold due to hypotension  9.  Chronic hyponatremia probably due to diuretics  10.  Hyperlipidemia on Vytorin     DVT Prophylaxis Coumadin  AM Labs Ordered, also please review Full Orders    Code Status full  Disposition Plan: Unknown  Time spent in minutes : 53 minutes  Condition critical   @SIGNATURE @

## 2018-01-09 NOTE — Telephone Encounter (Signed)
Verbals given, FYI 

## 2018-01-09 NOTE — Consult Note (Signed)
Discussed case with Dr. Kathrynn Humble. 82 year old male on coumadin with supratherapuetic INR with left thigh swelling that was atraumatic. Patient developed anemia likely secondary to possible bleeding in thigh. I reviewed the CT scan of the femur which shows an intramuscular hematoma likely causing the drop in Hgb. Recommend nonoperative treatment for now, will evaluate the patient in the morning to rule out evolving compartment syndrome. Recommend correction of his INR and symptomatic treatment of thigh hematoma. Formal consult to follow tomorrow AM.  Shona Needles, MD [facility] [phone] (phone)

## 2018-01-09 NOTE — ED Provider Notes (Signed)
[facility] EMERGENCY DEPARTMENT Provider Note   CSN: [id number] Arrival date & time: 01/09/18  1413     History   Chief Complaint Chief Complaint  Patient presents with  . Abnormal Lab    HPI [name] is a 82 y.o. male.  HPI  82 year old male with history of chronic A. fib on Coumadin, diabetes, coronary artery disease, mechanical aortic valve replacement comes in with chief complaint of abnormal labs.  Patient reports that he had sudden onset lower leg swelling over the past few days.  He saw his PCP and a DVT study was completed which is negative.  However the blood work at that time showed that his INR was greater than 10 and hemoglobin was less than 6.  Patient has been having some shortness of breath and dizziness when he stands up.  He denies any chest pain or shortness of breath.  Patient has no history of DVT, PE -and he has been taking his Coumadin as prescribed.  Patient was started on cephalosporin for UTI recently, otherwise no new medications.  Patient denies any gross hematuria or dark tarry stools or active bloody stools.  Past Medical History:  Diagnosis Date  . Carotid artery disease ([place]) 1994   s/p left carotid endarerectomy   . Chronic atrial fibrillation    a. on coumadin   . Chronic diastolic CHF (congestive heart failure) ([place])   . Diabetes mellitus without complication ([place])    dx 2016  . Dyspnea   . Heart murmur   . Hx of CABG    a. 1994  . Hypertension   . OSA (obstructive sleep apnea)   . Rheumatic fever   . S/P AVR (aortic valve replacement)    a. mechanical valve 1996  . Subclavian bypass stenosis ([place])   . 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 Vitamin B12 deficiency     Patient Active Problem List   Diagnosis Date Noted  . Anemia 01/09/2018  . Leg pain, anterior, left 01/07/2018  . Dysuria 01/07/2018  . Severe anemia 12/23/2017  . Supratherapeutic  INR 12/23/2017  . Hyponatremia 12/23/2017  . Coronary artery disease with history of myocardial infarction without history of CABG 12/23/2017  . Bleeding from mouth 12/20/2017  . Post-op pain 12/12/2017  . Dental caries 12/12/2017  . Severe mitral regurgitation 11/14/2017  . Pressure injury of skin 10/29/2017  . Shortness of breath   . Palliative care by specialist   . RVF (right ventricular failure) ([place])   . Right heart failure ([place]) 10/18/2017  . Hypoxia   . COPD exacerbation ([place]) 08/01/2017  . Pancytopenia ([place]) 08/01/2017  . Epistaxis 07/11/2017  . Actinic keratoses 04/05/2017  . Leg wound, right 02/18/2016  . Edema 02/18/2016  . Chronic anticoagulation 02/18/2016  . Hyperkalemia 07/25/2015  . Atherosclerosis of native arteries of extremity with intermittent claudication ([place]) 10/21/2014  . Thrombocytopenia ([place]) 10/20/2014  . Macrocytosis without anemia 10/20/2014  . OSA (obstructive sleep apnea) 05/15/2013  . Goals of care, counseling/discussion 05/01/2013  . History of mechanical aortic valve replacement 01/02/2013  . Obesity (BMI 30-39.9) 10/20/2012  . Type II diabetes mellitus with complication ([place]) 92/02/9416  . Acute asthmatic bronchitis 01/18/2012  . ACUTE KIDNEY FAILURE UNSPECIFIED 02/04/2009  . CUTANEOUS ERUPTIONS, DRUG-INDUCED 02/04/2009  . HLD (hyperlipidemia) 02/03/2009  . Essential hypertension 02/03/2009  . Coronary atherosclerosis 02/03/2009  . Atrial fibrillation ([place]) 02/03/2009  . Chronic diastolic heart failure ([place])  02/03/2009  . CAROTID ENDARTERECTOMY, LEFT, HX OF 02/03/2009  . INF&INFLAM REACT DUE CARD DEVICE IMPLANT&GRAFT 12/30/2008    Past Surgical History:  Procedure Laterality Date  . [place]   replaced due to aortic stenosis, [employer] mechanical prostesis  . CAROTID ENDARTERECTOMY Left 1997   subclavian bypass Done in [place]  . CORONARY ARTERY BYPASS GRAFT  1994   w SVG to RCA and SVG to circumflex  . heart  bypass     Done in [place]  . MULTIPLE EXTRACTIONS WITH ALVEOLOPLASTY Bilateral 12/12/2017   Procedure: Extraction of tooth #'s 1, 12,13,14,15,17,18,19, 29, and 30 with alveoloplasty and gross debridement of remaining teeth`;  Surgeon: Lenn Cal, DDS;  Location: [facility];  Service: Oral Surgery;  Laterality: Bilateral;  MULTIPLE EXTRACTION WITH ALVEOLOPLASTY WITH PRE PROSTHETIC SURGERY AND GROSS DEBRIDEMENT OF REMAINING TEETH  . TEE WITHOUT CARDIOVERSION N/A 10/23/2017   Procedure: TRANSESOPHAGEAL ECHOCARDIOGRAM (TEE);  Surgeon: Larey Dresser, MD;  Location: [facility] ENDOSCOPY;  Service: Cardiovascular;  Laterality: N/A;  . TONSILLECTOMY          Home Medications    Prior to Admission medications   Medication Sig Start Date End Date Taking? Authorizing Provider  allopurinol (ZYLOPRIM) 100 MG tablet Take 1 tablet (100 mg total) by mouth daily. 12/05/17  Yes Georgiana Shore, NP  amiodarone (PACERONE) 200 MG tablet Take 1 tablet (200 mg total) by mouth daily. 12/28/17  Yes Elgergawy, Silver Huguenin, MD  cefUROXime (CEFTIN) 250 MG tablet Take 1 tablet (250 mg total) by mouth 2 (two) times daily for 14 days. 01/08/18 01/22/18 Yes [name], [name], MD  ezetimibe-simvastatin (VYTORIN) 10-20 MG tablet Take 1 tablet by mouth at bedtime. 04/05/17  Yes [name], [name], MD  phytonadione (MEPHYTON) 5 MG tablet Take 0.5 tablets (2.5 mg total) by mouth once for 1 dose. 01/09/18 01/09/18 Yes Belva Crome, MD  Potassium Chloride ER 20 MEQ TBCR Take 40 mEq by mouth daily. PLEASE TAKE EXTRA DOSE EVERY MONDAY 12/28/17  Yes Elgergawy, Silver Huguenin, MD  torsemide (DEMADEX) 100 MG tablet Take 1 tablet (100 mg total) by mouth 2 (two) times daily. 12/05/17  Yes Georgiana Shore, NP  enoxaparin (LOVENOX) 100 MG/ML injection Inject 1 mL (100 mg total) into the skin every 12 (twelve) hours. 01/04/18   Belva Crome, MD  metolazone (ZAROXOLYN) 2.5 MG tablet Take 1 tablet (2.5 mg total) by mouth every Monday. 12/31/17   Elgergawy,  Silver Huguenin, MD  warfarin (COUMADIN) 5 MG tablet Take 1 tablet (5 mg total) by mouth daily at 6 PM. 12/28/17   Elgergawy, Silver Huguenin, MD    Family History Family History  Problem Relation Age of Onset  . Cancer Father        lung   . Hypertension Mother     Social History Social History   Tobacco Use  . Smoking status: Former Smoker    Packs/day: 1.00    Years: 30.00    Pack years: 30.00    Types: Cigarettes    Last attempt to quit: 04/03/1992    Years since quitting: 25.7  . Smokeless tobacco: Never Used  Substance Use Topics  . Alcohol use: Yes    Comment: 4-6 ounces of wine daily  . Drug use: No    Comment: Half a cup a day.     Allergies   Rifampin and Vancomycin   Review of Systems Review of Systems  Constitutional: Positive for activity change and  fatigue.  Respiratory: Positive for shortness of breath.   Cardiovascular: Negative for chest pain.  Gastrointestinal: Negative for nausea and vomiting.  Allergic/Immunologic: Negative for immunocompromised state.  Neurological: Positive for dizziness.  Hematological: Bruises/bleeds easily.  All other systems reviewed and are negative.    Physical Exam Updated Vital Signs BP (!) 107/57   Pulse 87   Temp 98 F (36.7 C) (Oral)   Resp 15   SpO2 100%   Physical Exam  Constitutional: He is oriented to person, place, and time. He appears well-developed.  HENT:  Head: Atraumatic.  Neck: Neck supple.  Cardiovascular: Normal rate.  Irregular rhythm  Pulmonary/Chest: Effort normal.  Abdominal: Soft.  Digital rectal exam did not reveal any gross blood  Musculoskeletal: He exhibits edema and tenderness.  Left lower extremity is grossly edematous, with pitting.  There is no ecchymosis appreciated.  Extremities similar temperature to touch. Gross sensory exam is normal.  Compartments over the calf, anterior, lateral and posterior thigh are still compressible.  Gross sensory exam is equal in bilateral lower  extremities.  Neurological: He is alert and oriented to person, place, and time.  Skin: Skin is warm.  Nursing note and vitals reviewed.    ED Treatments / Results  Labs (all labs ordered are listed, but only abnormal results are displayed) Labs Reviewed  BASIC METABOLIC PANEL - Abnormal; Notable for the following components:      Result Value   Sodium 130 (*)    Potassium 5.3 (*)    Chloride 94 (*)    Glucose, Bld 127 (*)    BUN 46 (*)    Creatinine, Ser 1.85 (*)    Calcium 8.7 (*)    GFR calc non Af Amer 33 (*)    GFR calc Af Amer 38 (*)    All other components within normal limits  CBC WITH DIFFERENTIAL/PLATELET - Abnormal; Notable for the following components:   WBC 12.8 (*)    RBC 1.77 (*)    Hemoglobin 5.6 (*)    HCT 17.7 (*)    RDW 21.7 (*)    nRBC 0.3 (*)    Neutro Abs 11.4 (*)    Lymphs Abs 0.5 (*)    Basophils Absolute 0.3 (*)    All other components within normal limits  PROTIME-INR - Abnormal; Notable for the following components:   Prothrombin Time 36.5 (*)    All other components within normal limits  APTT - Abnormal; Notable for the following components:   aPTT 95 (*)    All other components within normal limits  URINALYSIS, ROUTINE W REFLEX MICROSCOPIC  POC OCCULT BLOOD, ED  TYPE AND SCREEN  PREPARE RBC (CROSSMATCH)    EKG None  Radiology Ct Abdomen Pelvis Wo Contrast  Result Date: 01/09/2018 CLINICAL DATA:  Hematuria.  Left lower extremity swelling. EXAM: CT ABDOMEN AND PELVIS WITHOUT CONTRAST TECHNIQUE: Multidetector CT imaging of the abdomen and pelvis was performed following the standard protocol without IV contrast. COMPARISON:  03/16/2010 CT abdomen/pelvis. 10/26/2014 abdominal sonogram. FINDINGS: Lower chest: Left lower lobe 3 mm subpleural pulmonary nodule (series 5/image 19) is stable since 2011 CT and considered benign. Additional 2-3 mm left lower lobe pulmonary nodules are stable since 08/01/2017 chest CT and considered benign. Intact  lower sternotomy wire. Coronary atherosclerosis. Cardiomegaly. Hepatobiliary: Normal liver size. Dilated hepatic veins. Subcentimeter hypodense liver dome lesion is too small to characterize and stable since 08/01/2017 CT, probably benign. No additional liver lesions. Normal gallbladder with no radiopaque cholelithiasis. No biliary  ductal dilatation. Pancreas: Normal, with no mass or duct dilation. Spleen: Normal size. No mass. Adrenals/Urinary Tract: Normal adrenals. Symmetric moderate renal atrophy. No hydronephrosis. No renal stones. No contour deforming renal masses. Chronic mild diffuse bladder wall thickening. No bladder stones. Bladder is not significantly distended. Stomach/Bowel: Normal non-distended stomach. Normal caliber small bowel with no small bowel wall thickening. Normal appendix. Moderate stool throughout the large bowel. No large bowel wall thickening, significant diverticulosis or acute pericolonic fat stranding. Vascular/Lymphatic: Atherosclerotic nonaneurysmal abdominal aorta. No pathologically enlarged lymph nodes in the abdomen or pelvis. Reproductive: Moderately enlarged prostate. Nonspecific internal prostatic calcification. Other: No pneumoperitoneum, ascites or focal fluid collection. Partial visualization of heterogeneous hyperdense intramuscular mass in the deep anterior proximal left thigh musculature (series 3/image 113) measuring at least 8.6 x 7.3 cm. No evidence of a retroperitoneal hematoma. Musculoskeletal: No aggressive appearing focal osseous lesions. Moderate thoracolumbar spondylosis. IMPRESSION: 1. Heterogeneous hyperdense intramuscular mass in the deep anterior proximal left thigh musculature measuring at least 8.6 x 7.3 cm in axial dimensions, partially visualized on this CT abdomen/pelvis study, most compatible with intramuscular hematoma. Please see the separate concurrent CT left femur report for further details. 2. No evidence of retroperitoneal hematoma. 3. No  urolithiasis.  No hydronephrosis. 4. Chronic mild diffuse bladder wall thickening, probably due to chronic bladder outlet obstruction by the moderately enlarged prostate. 5. Cardiomegaly. 6. Moderate stool throughout the large bowel, suggesting constipation. 7.  Aortic Atherosclerosis (ICD10-I70.0). Electronically Signed   By: Ilona Sorrel M.D.   On: 01/09/2018 19:08   Ct Femur Left Wo Contrast  Result Date: 01/09/2018 CLINICAL DATA:  Left thigh swelling with drop in hemoglobin. EXAM: CT OF THE LOWER LEFT EXTREMITY WITHOUT CONTRAST TECHNIQUE: Multidetector CT imaging of the lower left extremity was performed according to the standard protocol. COMPARISON:  None. FINDINGS: Bones/Joint/Cartilage The included sacroiliac joints, pubic symphysis and hips are maintained. No fracture of the left femur nor suspicious osseous lesions. No hip or knee effusion. Ligaments Suboptimally assessed by CT. Muscles and Tendons Intramuscular hematoma centered within the left vastus lateralis muscle measuring 22.2 x 10.8 x 6.6 cm is identified (craniocaudad by transverse by AP) with hematocrit level noted. The remainder of the anterior compartment muscles are unremarkable. Soft tissues Mild soft tissue induration of the left thigh. IMPRESSION: Intramuscular hematoma of the left vastus lateralis muscle measuring 22 x 10.8 x 6.6 cm. No acute osseous abnormality. Electronically Signed   By: Ashley Royalty M.D.   On: 01/09/2018 19:08   Dg Chest Port 1 View  Result Date: 01/09/2018 CLINICAL DATA:  Low hemoglobin, pre transfusion EXAM: PORTABLE CHEST 1 VIEW COMPARISON:  December 23, 2017 FINDINGS: The heart size and mediastinal contours are stable. The heart size is enlarged. Patient status post prior median sternotomy. Both lungs are clear. The visualized skeletal structures are unremarkable. IMPRESSION: Cardiomegaly.  The lungs are clear. Electronically Signed   By: Abelardo Diesel M.D.   On: 01/09/2018 17:44    Procedures .Critical  Care Performed by: Varney Biles, MD Authorized by: Varney Biles, MD   Critical care provider statement:    Critical care time (minutes):  45   Critical care start time:  01/09/2018 4:08 PM   Critical care end time:  01/09/2018 7:08 PM   Critical care was necessary to treat or prevent imminent or life-threatening deterioration of the following conditions:  Circulatory failure   Critical care was time spent personally by me on the following activities:  Discussions with consultants, evaluation of patient's  response to treatment, examination of patient, ordering and performing treatments and interventions, ordering and review of laboratory studies, ordering and review of radiographic studies, pulse oximetry, re-evaluation of patient's condition, obtaining history from patient or surrogate and review of old charts   (including critical care time)  Medications Ordered in ED Medications  0.9 %  sodium chloride infusion (has no administration in time range)     Initial Impression / Assessment and Plan / ED Course  I have reviewed the triage vital signs and the nursing notes.  Pertinent labs & imaging results that were available during my care of the patient were reviewed by me and considered in my medical decision making (see chart for details).     82 year old male comes in with chief complaint of abnormal labs.  He is noted to have hemoglobin less than 6, and patient is symptomatic secondary to it.  Hemoccult done, however there is no grossly bloody stools.  Patient has large swelling over the lower extremity -ultrasound DVT was negative.  It is possible that there could be hematoma underlying, there is no gross ecchymosis appreciated.  We will consult orthopedic doctors for further evaluation of the significant swelling in the lower extremity from compartment syndrome perspective.  Clinically we do not have compartment syndrome at this time, however patient will need further evaluation  for it during the admission.  Dr. Doreatha Martin, orthopedic surgery will see the patient.  We ordered CT scan of her left thigh to get further evaluation.  It is a noncontrasted study.  I spoke with Dr. Laren Everts, the hospitalist.  He also wants [place] to add CT abdomen pelvis without contrast, which we have added.  The hospitalist team will follow up on the CAT scan results.  We will ensure the CT scans are completed before patient leaves the ED.  Final Clinical Impressions(s) / ED Diagnoses   Final diagnoses:  Symptomatic anemia  Supratherapeutic INR  Hematoma    ED Discharge Orders    None       Varney Biles, MD 01/09/18 [phone]

## 2018-01-09 NOTE — ED Notes (Signed)
X-ray at bedside

## 2018-01-09 NOTE — ED Provider Notes (Addendum)
MSE was initiated and I personally evaluated the patient and placed orders (if any) at  2:36 PM on January 09, 2018.  The patient appears stable so that the remainder of the MSE may be completed by another provider.  Patient placed in Quick Look pathway, seen and evaluated   Chief Complaint: abnormal lab   HPI:   82 year old male with past medical history of CAD, A. fib currently on Coumadin and Lovenox, CHF, diabetes presents to ED regarding abnormal lab work.  Patient seen and evaluated by his PCP yesterday and at the heart failure clinic today.  He was told that he had an INR greater than 10 and a hemoglobin of 7.  Patient has had to receive numerous blood transfusions in the past.  He denies any active bleeding at this time including no hematochezia or melena, hematemesis.  He reports some shortness of breath.  His main complaint today is left lower extremity edema for the past 5 days.  He had a DVT ultrasound done yesterday that was negative.  However, he states that "they wanted to order a CT scan or something of my leg."He did not take any Coumadin yesterday or today.  He only took his morning dose of Lovenox yesterday.   He took half of a vitamin K tablet today as prescribed by his PCP.  ROS: leg swelling (one)  Physical Exam:   Gen: No distress  Neuro: Awake and Alert  Skin: Warm    Focused Exam: Pitting edema of the left lower extremity extending up to the groin.  No calf tenderness noted. Lungs CTAB. On 2L of oxygen. RRR.  2:44 PM Informed RN that patient needs next available room.  2:59 PM Spoke to nurse at heart failure clinic to verify appropriate imaging.  They request that patient received a CT (not CTA) of the left leg and possibly the pelvis.  Initiation of care has begun. The patient has been counseled on the process, plan, and necessity for staying for the completion/evaluation, and the remainder of the medical screening examination       Delia Heady, PA-C 01/09/18  1459    Charlesetta Shanks, MD 01/10/18 [phone]

## 2018-01-09 NOTE — Telephone Encounter (Signed)
Noted. Thx.

## 2018-01-09 NOTE — ED Notes (Signed)
Patient transported to CT 

## 2018-01-10 ENCOUNTER — Other Ambulatory Visit: Payer: Self-pay

## 2018-01-10 ENCOUNTER — Encounter (HOSPITAL_COMMUNITY): Payer: Self-pay

## 2018-01-10 DIAGNOSIS — S8012XA Contusion of left lower leg, initial encounter: Secondary | ICD-10-CM

## 2018-01-10 DIAGNOSIS — I959 Hypotension, unspecified: Secondary | ICD-10-CM

## 2018-01-10 DIAGNOSIS — G4733 Obstructive sleep apnea (adult) (pediatric): Secondary | ICD-10-CM

## 2018-01-10 DIAGNOSIS — Z952 Presence of prosthetic heart valve: Secondary | ICD-10-CM

## 2018-01-10 DIAGNOSIS — I25810 Atherosclerosis of coronary artery bypass graft(s) without angina pectoris: Secondary | ICD-10-CM

## 2018-01-10 DIAGNOSIS — N179 Acute kidney failure, unspecified: Secondary | ICD-10-CM

## 2018-01-10 LAB — GLUCOSE, CAPILLARY
GLUCOSE-CAPILLARY: 110 mg/dL — AB (ref 70–99)
GLUCOSE-CAPILLARY: 127 mg/dL — AB (ref 70–99)
Glucose-Capillary: 109 mg/dL — ABNORMAL HIGH (ref 70–99)
Glucose-Capillary: 111 mg/dL — ABNORMAL HIGH (ref 70–99)

## 2018-01-10 LAB — PROTIME-INR
INR: 1.81
PROTHROMBIN TIME: 20.8 s — AB (ref 11.4–15.2)

## 2018-01-10 LAB — PREPARE RBC (CROSSMATCH)

## 2018-01-10 LAB — HEMOGLOBIN
HEMOGLOBIN: 6.6 g/dL — AB (ref 13.0–17.0)
Hemoglobin: 7 g/dL — ABNORMAL LOW (ref 13.0–17.0)

## 2018-01-10 LAB — HEMOGLOBIN AND HEMATOCRIT, BLOOD
HEMATOCRIT: 20.2 % — AB (ref 39.0–52.0)
HEMOGLOBIN: 6.6 g/dL — AB (ref 13.0–17.0)

## 2018-01-10 LAB — APTT: aPTT: 67 seconds — ABNORMAL HIGH (ref 24–36)

## 2018-01-10 MED ORDER — SODIUM CHLORIDE 0.9% IV SOLUTION: Freq: Once | INTRAVENOUS | Status: DC

## 2018-01-10 MED ORDER — ACETAMINOPHEN 325 MG PO TABS
650.0000 mg | ORAL_TABLET | Freq: Four times a day (QID) | ORAL | Status: DC | PRN
  Administered 2018-01-10: 650 mg via ORAL
  Filled 2018-01-10: qty 2

## 2018-01-10 NOTE — Progress Notes (Signed)
[facility]  Patient Status: Active (receiving services up to time of hospitalization)  [facility] is providing the following services: RN and PT  If patient discharges after hours, please call [phone].   Janae Sauce 01/10/2018, 9:44 AM

## 2018-01-10 NOTE — Progress Notes (Signed)
Paged MD for post transfusion Hgb:6.6. Awaiting response

## 2018-01-10 NOTE — Progress Notes (Signed)
PROGRESS NOTE  [name] TKW:[id number] DOB: 06-07-1935 DOA: 01/09/2018 PCP: Cassandria Anger, MD   LOS: 1 day   Brief Narrative / Interim history: 82 y.o. M w/ a hx of mechanical aortic valve (1994), OSA on CPAP, permanent A. fib, chronic anticoagulant therapy with Coumadin, chronic combined CHF, CKD stage III, DM, CAD with CABG 1994, COPD, severe mitral regurgitation, carotid stenosis with left carotid enterotomy, HTN, and HLD who was admitted with anemia to 5.6 due to left leg hematoma.  INR elevated to 10 and hemoglobin down to 7 about 2 days ago at PCP office.  He was given vitamin K tablet and had a left leg ultrasound concerning for DVT/hematoma yesterday that prompted him to come to ED.  In ED, hemoglobin down to 5.6.  INR 3.8.  He was admitted and transfused 3 units of blood 3 apheresis of FFP with minimal improvement in his hemoglobin to 6.7 this morning.  Orthopedic surgery consulted and suggested compression or TED hose but no surgery.  Patient recently hospitalized from 9/22 two 9/27 with supratherapeutic INR and significant bleeding after dental extraction.  Coumadin held briefly in the setting of supratherapeutic INR ambulating.  Received 6 units of packed red blood cell transfusion.  Coumadin resumed with Lovenox bridge and patient was discharged home.   Subjective: Had 3 units of blood and 3 apheresis of fresh frozen plasma.  Denies chest pain, dyspnea or palpitation.  Still feels some pain in his left thigh.   Assessment & Plan: Active Problems:   Anemia  Acute blood loss anemia/left thigh hematoma: CT femur with intramuscular hematoma of the left vastus lateralis muscle measuring 22 x 10.8 x 6.6 cm.  Status post 3 units of packed red blood cells and 3 units FFP.  Minimal improvement in his hemoglobin from 5.6-6.6.  Initial hemoglobin of 5.6 likely false reassurance before hemodilution.  Orthopedic surgery evaluated patient and recommended nonoperative management and  follow-up in 3 to 4 weeks.  PT/INR down to 21/1.81.  APTT improved but elevated to 67 -Transfuse 2 more units -Monitor H&H -Encourage range of motion -Compression stockings or TED hose  Supratherapeutic INR: Status post 3 units of FFP.  PT/INR down to 21/1.81 -Hold anticoagulation -Continue monitoring coag labs  Mechanical aortic valve status -Hold anticoagulations the setting of bleeding.  History of CAD status post CABG/PAD: No anginal symptoms -Transfuse to keep hemoglobin greater than 8.0. -Not on beta-blocker  Atrial fibrillation: Not in RVR -Continue amiodarone -Hold anticoagulations  Chronic combined CHF: Stable.  No signs of fluid overload. -Daily weight, intake output and BMP. -We will resume home diuretics when stabilized  Hypertension: Currently mildly hypotensive. -Hold home diuretics  DM2 -Sliding scale insulin -CBG monitoring  Hyponatremia: Chronic -Continue monitoring  Mild hypokalemia -Recheck in the morning  AKI on CKD:?Prerenal due to blood loss -Continue monitoring  OSA -Nightly CPAP  Scheduled Meds: . sodium chloride   Intravenous Once  . sodium chloride   Intravenous Once  . sodium chloride   Intravenous Once  . amiodarone  200 mg Oral Daily  . insulin aspart  0-9 Units Subcutaneous TID WC  . sodium chloride flush  3 mL Intravenous Q12H   Continuous Infusions: . sodium chloride    . sodium chloride     PRN Meds:.sodium chloride, ondansetron **OR** ondansetron (ZOFRAN) IV, sodium chloride flush  DVT prophylaxis: SCD Code Status: Full code Family Communication: No family member at bedside Disposition Plan: Continue inpatient care, stepdown.  Very high risk for deconditioning.  Consultants:   Orthopedic surgery  Procedures:   None  Antimicrobials:  None  Objective: Vitals:   01/10/18 0600 01/10/18 0709 01/10/18 0828 01/10/18 0942  BP: (!) 91/43 (!) 102/50 (!) 95/54 (!) 100/47  Pulse:   80   Resp: 20 20 18    Temp: 98.1  F (36.7 C) 98.3 F (36.8 C) 98.4 F (36.9 C)   TempSrc: Oral Oral Oral   SpO2: 100% 100% 100% 100%    Intake/Output Summary (Last 24 hours) at 01/10/2018 1348 Last data filed at 01/10/2018 1001 Gross per 24 hour  Intake 1232 ml  Output 850 ml  Net 382 ml   There were no vitals filed for this visit.  Examination:  GENERAL: appears well, no ditress HEENT: PERRLA, MMM NECK: Supple.  No JVD. LUNGS:  No IWOB, good air movement, CTAB HEART:   Irregular rhythm.  Normal rate.  Mechanical valve heart sound.  Faint DP pulses bilaterally. ABD:  Morbidly obese, soft, NT with active BS EXT:   Chronic stasis dermatitis bilaterally. NEURO:  awake, alert and oriented appropriately. No gross deficit PSYCH: calm. Normal affect   Data Reviewed: I have independently reviewed following labs and imaging studies  CBC: Recent Labs  Lab 01/07/18 1539 01/09/18 1358 01/09/18 1435 01/10/18 0652 01/10/18 1036  WBC 8.7 12.9* 12.8*  --   --   NEUTROABS 7.1  --  11.4*  --   --   HGB 7.1 Repeated and verified X2.* 5.3* 5.6* 6.6* 6.6*  HCT 20.5* 17.1* 17.7*  --  20.2*  MCV 90.3 99.4 100.0  --   --   PLT 191.0 253 248  --   --    Basic Metabolic Panel: Recent Labs  Lab 01/07/18 1539 01/09/18 1358 01/09/18 1435  NA 129* 129* 130*  K 4.9 5.4* 5.3*  CL 92* 93* 94*  CO2 30 23 23   GLUCOSE 125* 121* 127*  BUN 39* 44* 46*  CREATININE 1.51* 1.85* 1.85*  CALCIUM 8.8 8.5* 8.7*   GFR: Estimated Creatinine Clearance: 37 mL/min (A) (by C-G formula based on SCr of 1.85 mg/dL (H)). Liver Function Tests: No results for input(s): AST, ALT, ALKPHOS, BILITOT, PROT, ALBUMIN in the last 168 hours. No results for input(s): LIPASE, AMYLASE in the last 168 hours. No results for input(s): AMMONIA in the last 168 hours. Coagulation Profile: Recent Labs  Lab 01/04/18 01/08/18 01/09/18 1358 01/09/18 1435 01/10/18 1036  INR 2.2 10.0* 3.93 3.71 1.81   Cardiac Enzymes: No results for input(s): CKTOTAL,  CKMB, CKMBINDEX, TROPONINI in the last 168 hours. BNP (last 3 results) Recent Labs    03/30/17 1147 05/25/17 1044 07/10/17 1523  PROBNP 1,858* 2,749* 411.0*   HbA1C: No results for input(s): HGBA1C in the last 72 hours. CBG: Recent Labs  Lab 01/10/18 0743 01/10/18 1201  GLUCAP 110* 109*   Lipid Profile: No results for input(s): CHOL, HDL, LDLCALC, TRIG, CHOLHDL, LDLDIRECT in the last 72 hours. Thyroid Function Tests: No results for input(s): TSH, T4TOTAL, FREET4, T3FREE, THYROIDAB in the last 72 hours. Anemia Panel: No results for input(s): VITAMINB12, FOLATE, FERRITIN, TIBC, IRON, RETICCTPCT in the last 72 hours. Urine analysis:    Component Value Date/Time   COLORURINE YELLOW 01/09/2018 1305   APPEARANCEUR CLOUDY (A) 01/09/2018 1305   LABSPEC 1.011 01/09/2018 1305   PHURINE 6.0 01/09/2018 1305   GLUCOSEU NEGATIVE 01/09/2018 1305   GLUCOSEU NEGATIVE 01/07/2018 1539   HGBUR LARGE (A) 01/09/2018 1305   BILIRUBINUR NEGATIVE 01/09/2018 1305   BILIRUBINUR Neg  08/14/2014 [facility] 01/09/2018 1305   PROTEINUR NEGATIVE 01/09/2018 1305   UROBILINOGEN 1.0 01/07/2018 1539   NITRITE NEGATIVE 01/09/2018 1305   LEUKOCYTESUR LARGE (A) 01/09/2018 1305   Sepsis Labs: Invalid input(s): PROCALCITONIN, LACTICIDVEN  No results found for this or any previous visit (from the past 240 hour(s)).    Radiology Studies: Ct Abdomen Pelvis Wo Contrast  Result Date: 01/09/2018 CLINICAL DATA:  Hematuria.  Left lower extremity swelling. EXAM: CT ABDOMEN AND PELVIS WITHOUT CONTRAST TECHNIQUE: Multidetector CT imaging of the abdomen and pelvis was performed following the standard protocol without IV contrast. COMPARISON:  03/16/2010 CT abdomen/pelvis. 10/26/2014 abdominal sonogram. FINDINGS: Lower chest: Left lower lobe 3 mm subpleural pulmonary nodule (series 5/image 19) is stable since 2011 CT and considered benign. Additional 2-3 mm left lower lobe pulmonary nodules are stable  since 08/01/2017 chest CT and considered benign. Intact lower sternotomy wire. Coronary atherosclerosis. Cardiomegaly. Hepatobiliary: Normal liver size. Dilated hepatic veins. Subcentimeter hypodense liver dome lesion is too small to characterize and stable since 08/01/2017 CT, probably benign. No additional liver lesions. Normal gallbladder with no radiopaque cholelithiasis. No biliary ductal dilatation. Pancreas: Normal, with no mass or duct dilation. Spleen: Normal size. No mass. Adrenals/Urinary Tract: Normal adrenals. Symmetric moderate renal atrophy. No hydronephrosis. No renal stones. No contour deforming renal masses. Chronic mild diffuse bladder wall thickening. No bladder stones. Bladder is not significantly distended. Stomach/Bowel: Normal non-distended stomach. Normal caliber small bowel with no small bowel wall thickening. Normal appendix. Moderate stool throughout the large bowel. No large bowel wall thickening, significant diverticulosis or acute pericolonic fat stranding. Vascular/Lymphatic: Atherosclerotic nonaneurysmal abdominal aorta. No pathologically enlarged lymph nodes in the abdomen or pelvis. Reproductive: Moderately enlarged prostate. Nonspecific internal prostatic calcification. Other: No pneumoperitoneum, ascites or focal fluid collection. Partial visualization of heterogeneous hyperdense intramuscular mass in the deep anterior proximal left thigh musculature (series 3/image 113) measuring at least 8.6 x 7.3 cm. No evidence of a retroperitoneal hematoma. Musculoskeletal: No aggressive appearing focal osseous lesions. Moderate thoracolumbar spondylosis. IMPRESSION: 1. Heterogeneous hyperdense intramuscular mass in the deep anterior proximal left thigh musculature measuring at least 8.6 x 7.3 cm in axial dimensions, partially visualized on this CT abdomen/pelvis study, most compatible with intramuscular hematoma. Please see the separate concurrent CT left femur report for further details.  2. No evidence of retroperitoneal hematoma. 3. No urolithiasis.  No hydronephrosis. 4. Chronic mild diffuse bladder wall thickening, probably due to chronic bladder outlet obstruction by the moderately enlarged prostate. 5. Cardiomegaly. 6. Moderate stool throughout the large bowel, suggesting constipation. 7.  Aortic Atherosclerosis (ICD10-I70.0). Electronically Signed   By: Ilona Sorrel M.D.   On: 01/09/2018 19:08   Ct Femur Left Wo Contrast  Result Date: 01/09/2018 CLINICAL DATA:  Left thigh swelling with drop in hemoglobin. EXAM: CT OF THE LOWER LEFT EXTREMITY WITHOUT CONTRAST TECHNIQUE: Multidetector CT imaging of the lower left extremity was performed according to the standard protocol. COMPARISON:  None. FINDINGS: Bones/Joint/Cartilage The included sacroiliac joints, pubic symphysis and hips are maintained. No fracture of the left femur nor suspicious osseous lesions. No hip or knee effusion. Ligaments Suboptimally assessed by CT. Muscles and Tendons Intramuscular hematoma centered within the left vastus lateralis muscle measuring 22.2 x 10.8 x 6.6 cm is identified (craniocaudad by transverse by AP) with hematocrit level noted. The remainder of the anterior compartment muscles are unremarkable. Soft tissues Mild soft tissue induration of the left thigh. IMPRESSION: Intramuscular hematoma of the left vastus lateralis muscle measuring 22 x  10.8 x 6.6 cm. No acute osseous abnormality. Electronically Signed   By: Ashley Royalty M.D.   On: 01/09/2018 19:08   Dg Chest Port 1 View  Result Date: 01/09/2018 CLINICAL DATA:  Low hemoglobin, pre transfusion EXAM: PORTABLE CHEST 1 VIEW COMPARISON:  December 23, 2017 FINDINGS: The heart size and mediastinal contours are stable. The heart size is enlarged. Patient status post prior median sternotomy. Both lungs are clear. The visualized skeletal structures are unremarkable. IMPRESSION: Cardiomegaly.  The lungs are clear. Electronically Signed   By: Abelardo Diesel M.D.    On: 01/09/2018 17:44    Lakyn Mantione T. [facility] Pager [phone]  If 7PM-7AM, please contact night-coverage [email or web address] Password [email or web address] 01/10/2018, 1:48 PM

## 2018-01-10 NOTE — Progress Notes (Signed)
Patient received total 3 units PRBC and infusing #3 of 3 FFP currently with no s/s of adverse reaction noted.

## 2018-01-10 NOTE — Consult Note (Signed)
Reason for Consult:Left thigh hematoma Referring Physician: T Travas Beard is an 82 y.o. male.  HPI: [name] was getting out of the shower a couple of days ago and noticed some aching. He denies any trauma or fall. The pain steadily got worse. He also had some SOB and weakness and came to the ED for evaluation. He was found to be profoundly anemic with a supertherapeutic INR. He was admitted and transfused. CT of the thigh showed a large hematoma and orthopedic surgery was consulted. He c/o localized left thigh pain but denies N/Andrew distal to that. His SOB and weakness have improved.  Past Medical History:  Diagnosis Date  . Carotid artery disease ([place]) 1994   s/p left carotid endarerectomy   . Chronic atrial fibrillation    a. on coumadin   . Chronic diastolic CHF (congestive heart failure) ([place])   . Diabetes mellitus without complication ([place])    dx 2016  . Dyspnea   . Heart murmur   . Hx of CABG    a. 1994  . Hypertension   . OSA (obstructive sleep apnea)   . Rheumatic fever   . S/P AVR (aortic valve replacement)    a. mechanical valve 1996  . Subclavian bypass stenosis ([place])   . 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 Vitamin B12 deficiency     Past Surgical History:  Procedure Laterality Date  . [place]   replaced due to aortic stenosis, [employer] mechanical prostesis  . CAROTID ENDARTERECTOMY Left 1997   subclavian bypass Done in [place]  . CORONARY ARTERY BYPASS GRAFT  1994   w SVG to RCA and SVG to circumflex  . heart bypass     Done in [place]  . MULTIPLE EXTRACTIONS WITH ALVEOLOPLASTY Bilateral 12/12/2017   Procedure: Extraction of tooth #'s 1, 12,13,14,15,17,18,19, 29, and 30 with alveoloplasty and gross debridement of remaining teeth`;  Surgeon: Lenn Cal, DDS;  Location: [facility];  Service: Oral Surgery;  Laterality: Bilateral;  MULTIPLE EXTRACTION WITH ALVEOLOPLASTY WITH PRE  PROSTHETIC SURGERY AND GROSS DEBRIDEMENT OF REMAINING TEETH  . TEE WITHOUT CARDIOVERSION N/A 10/23/2017   Procedure: TRANSESOPHAGEAL ECHOCARDIOGRAM (TEE);  Surgeon: Larey Dresser, MD;  Location: [facility] ENDOSCOPY;  Service: Cardiovascular;  Laterality: N/A;  . TONSILLECTOMY      Family History  Problem Relation Age of Onset  . Cancer Father        lung   . Hypertension Mother     Social History:  reports that he quit smoking about 25 years ago. His smoking use included cigarettes. He has a 30.00 pack-year smoking history. He has never used smokeless tobacco. He reports that he drinks alcohol. He reports that he does not use drugs.  Allergies:  Allergies  Allergen Reactions  . Rifampin Rash    May have been caused by Vancomycin or Rifampin (??)  . Vancomycin Rash    May have been caused by Vancomycin or Rifampin (??)    Medications: I have reviewed the patient's current medications.  Results for orders placed or performed during the hospital encounter of 01/09/18 (from the past 48 hour(s))  Urinalysis, Routine w reflex microscopic     Status: Abnormal   Collection Time: 01/09/18  1:05 PM  Result Value Ref Range   Color, Urine YELLOW YELLOW   APPearance CLOUDY (A) CLEAR   Specific Gravity, Urine 1.011 1.005 - 1.030   pH 6.0  5.0 - 8.0   Glucose, UA NEGATIVE NEGATIVE mg/dL   Hgb urine dipstick LARGE (A) NEGATIVE   Bilirubin Urine NEGATIVE NEGATIVE   Ketones, ur NEGATIVE NEGATIVE mg/dL   Protein, ur NEGATIVE NEGATIVE mg/dL   Nitrite NEGATIVE NEGATIVE   Leukocytes, UA LARGE (A) NEGATIVE   RBC / HPF >50 (H) 0 - 5 RBC/hpf   WBC, UA >50 (H) 0 - 5 WBC/hpf   Bacteria, UA MANY (A) NONE SEEN   WBC Clumps PRESENT    Hyaline Casts, UA PRESENT     Comment: Performed at [facility] [address], [place], [place] [address]  Basic metabolic panel     Status: Abnormal   Collection Time: 01/09/18  2:35 PM  Result Value Ref Range   Sodium 130 (L) 135 - 145 mmol/L   Potassium 5.3  (H) 3.5 - 5.1 mmol/L   Chloride 94 (L) 98 - 111 mmol/L   CO2 23 22 - 32 mmol/L   Glucose, Bld 127 (H) 70 - 99 mg/dL   BUN 46 (H) 8 - 23 mg/dL   Creatinine, Ser 1.85 (H) 0.61 - 1.24 mg/dL   Calcium 8.7 (L) 8.9 - 10.3 mg/dL   GFR calc non Af Amer 33 (L) >60 mL/min   GFR calc Af Amer 38 (L) >60 mL/min    Comment: (NOTE) The eGFR has been calculated using the CKD EPI equation. This calculation has not been validated in all clinical situations. eGFR's persistently <60 mL/min signify possible Chronic Kidney Disease.    Anion gap 13 5 - 15    Comment: Performed at [facility] [address], [place], [place] [address]  CBC with Differential     Status: Abnormal   Collection Time: 01/09/18  2:35 PM  Result Value Ref Range   WBC 12.8 (H) 4.0 - 10.5 K/uL   RBC 1.77 (L) 4.22 - 5.81 MIL/uL   Hemoglobin 5.6 (LL) 13.0 - 17.0 g/dL    Comment: REPEATED TO VERIFY CRITICAL VALUE NOTED.  VALUE IS CONSISTENT WITH PREVIOUSLY REPORTED AND CALLED VALUE.    HCT 17.7 (L) 39.0 - 52.0 %   MCV 100.0 80.0 - 100.0 fL   MCH 31.6 26.0 - 34.0 pg   MCHC 31.6 30.0 - 36.0 g/dL   RDW 21.7 (H) 11.5 - 15.5 %   Platelets 248 150 - 400 K/uL   nRBC 0.3 (H) 0.0 - 0.2 %   Neutrophils Relative % 89 %   Neutro Abs 11.4 (H) 1.7 - 7.7 K/uL   Lymphocytes Relative 4 %   Lymphs Abs 0.5 (L) 0.7 - 4.0 K/uL   Monocytes Relative 4 %   Monocytes Absolute 0.5 0.1 - 1.0 K/uL   Eosinophils Relative 1 %   Eosinophils Absolute 0.1 0.0 - 0.5 K/uL   Basophils Relative 2 %   Basophils Absolute 0.3 (H) 0.0 - 0.1 K/uL   nRBC 0 0 /100 WBC   Burr Cells PRESENT    Polychromasia PRESENT     Comment: Performed at [facility], [place] [address] [place], [place] [address]  Protime-INR     Status: Abnormal   Collection Time: 01/09/18  2:35 PM  Result Value Ref Range   Prothrombin Time 36.5 (H) 11.4 - 15.2 seconds   INR 3.71     Comment: Performed at [facility] [address], [place], [place] [address]  APTT      Status: Abnormal   Collection Time: 01/09/18  2:35  PM  Result Value Ref Range   aPTT 95 (H) 24 - 36 seconds    Comment:        IF BASELINE aPTT IS ELEVATED, SUGGEST PATIENT RISK ASSESSMENT BE USED TO DETERMINE APPROPRIATE ANTICOAGULANT THERAPY. Performed at [facility], [place] [address] [place], [place] [address]   Type and screen [place]     Status: None   Collection Time: 01/09/18  2:49 PM  Result Value Ref Range   ABO/RH(D) O NEG    Antibody Screen NEG    Sample Expiration 01/12/2018    Unit Number [id number]    Blood Component Type RED CELLS,LR    Unit division 00    Status of Unit ISSUED,FINAL    Transfusion Status OK TO TRANSFUSE    Crossmatch Result Compatible    Unit Number [id number]    Blood Component Type RBC LR PHER1    Unit division 00    Status of Unit ISSUED,FINAL    Transfusion Status OK TO TRANSFUSE    Crossmatch Result      Compatible Performed at [facility], [place] [address], [place], [place] [address]    Unit Number [id number]    Blood Component Type RBC LR PHER1    Unit division 00    Status of Unit ISSUED,FINAL    Transfusion Status OK TO TRANSFUSE    Crossmatch Result Compatible   Prepare RBC     Status: None   Collection Time: 01/09/18  4:15 PM  Result Value Ref Range   Order Confirmation      ORDER PROCESSED BY BLOOD BANK Performed at [facility], [place] [address], [place], [place] [address]   POC occult blood, ED     Status: None   Collection Time: 01/09/18  5:28 PM  Result Value Ref Range   Fecal Occult Bld NEGATIVE NEGATIVE  Prepare fresh frozen plasma     Status: None (Preliminary result)   Collection Time: 01/09/18  7:32 PM  Result Value Ref Range   Unit Number [id number]    Blood Component Type THW PLS APHR    Unit division B0    Status of Unit ISSUED    Transfusion Status      OK TO TRANSFUSE Performed at [facility], [place] [address] [place], [place] [address]    Unit  Number [id number]    Blood Component Type THW PLS APHR    Unit division B0    Status of Unit ISSUED    Transfusion Status OK TO TRANSFUSE    Unit Number [id number]    Blood Component Type THW PLS APHR    Unit division B0    Status of Unit ISSUED    Transfusion Status OK TO TRANSFUSE   Hemoglobin     Status: Abnormal   Collection Time: 01/10/18  6:52 AM  Result Value Ref Range   Hemoglobin 6.6 (LL) 13.0 - 17.0 g/dL    Comment: REPEATED TO VERIFY CRITICAL VALUE NOTED.  VALUE IS CONSISTENT WITH PREVIOUSLY REPORTED AND CALLED VALUE. Performed at [facility], [place] [address] [place], [place] [address]   Glucose, capillary     Status: Abnormal   Collection Time: 01/10/18  7:43 AM  Result Value Ref Range   Glucose-Capillary 110 (H) 70 - 99 mg/dL    Ct Abdomen Pelvis Wo Contrast  Result Date: 01/09/2018 CLINICAL DATA:  Hematuria.  Left lower extremity swelling. EXAM: CT ABDOMEN AND PELVIS WITHOUT  CONTRAST TECHNIQUE: Multidetector CT imaging of the abdomen and pelvis was performed following the standard protocol without IV contrast. COMPARISON:  03/16/2010 CT abdomen/pelvis. 10/26/2014 abdominal sonogram. FINDINGS: Lower chest: Left lower lobe 3 mm subpleural pulmonary nodule (series 5/image 19) is stable since 2011 CT and considered benign. Additional 2-3 mm left lower lobe pulmonary nodules are stable since 08/01/2017 chest CT and considered benign. Intact lower sternotomy wire. Coronary atherosclerosis. Cardiomegaly. Hepatobiliary: Normal liver size. Dilated hepatic veins. Subcentimeter hypodense liver dome lesion is too small to characterize and stable since 08/01/2017 CT, probably benign. No additional liver lesions. Normal gallbladder with no radiopaque cholelithiasis. No biliary ductal dilatation. Pancreas: Normal, with no mass or duct dilation. Spleen: Normal size. No mass. Adrenals/Urinary Tract: Normal adrenals. Symmetric moderate renal atrophy. No hydronephrosis. No renal  stones. No contour deforming renal masses. Chronic mild diffuse bladder wall thickening. No bladder stones. Bladder is not significantly distended. Stomach/Bowel: Normal non-distended stomach. Normal caliber small bowel with no small bowel wall thickening. Normal appendix. Moderate stool throughout the large bowel. No large bowel wall thickening, significant diverticulosis or acute pericolonic fat stranding. Vascular/Lymphatic: Atherosclerotic nonaneurysmal abdominal aorta. No pathologically enlarged lymph nodes in the abdomen or pelvis. Reproductive: Moderately enlarged prostate. Nonspecific internal prostatic calcification. Other: No pneumoperitoneum, ascites or focal fluid collection. Partial visualization of heterogeneous hyperdense intramuscular mass in the deep anterior proximal left thigh musculature (series 3/image 113) measuring at least 8.6 x 7.3 cm. No evidence of a retroperitoneal hematoma. Musculoskeletal: No aggressive appearing focal osseous lesions. Moderate thoracolumbar spondylosis. IMPRESSION: 1. Heterogeneous hyperdense intramuscular mass in the deep anterior proximal left thigh musculature measuring at least 8.6 x 7.3 cm in axial dimensions, partially visualized on this CT abdomen/pelvis study, most compatible with intramuscular hematoma. Please see the separate concurrent CT left femur report for further details. 2. No evidence of retroperitoneal hematoma. 3. No urolithiasis.  No hydronephrosis. 4. Chronic mild diffuse bladder wall thickening, probably due to chronic bladder outlet obstruction by the moderately enlarged prostate. 5. Cardiomegaly. 6. Moderate stool throughout the large bowel, suggesting constipation. 7.  Aortic Atherosclerosis (ICD10-I70.0). Electronically Signed   By: Ilona Sorrel M.D.   On: 01/09/2018 19:08   Ct Femur Left Wo Contrast  Result Date: 01/09/2018 CLINICAL DATA:  Left thigh swelling with drop in hemoglobin. EXAM: CT OF THE LOWER LEFT EXTREMITY WITHOUT CONTRAST  TECHNIQUE: Multidetector CT imaging of the lower left extremity was performed according to the standard protocol. COMPARISON:  None. FINDINGS: Bones/Joint/Cartilage The included sacroiliac joints, pubic symphysis and hips are maintained. No fracture of the left femur nor suspicious osseous lesions. No hip or knee effusion. Ligaments Suboptimally assessed by CT. Muscles and Tendons Intramuscular hematoma centered within the left vastus lateralis muscle measuring 22.2 x 10.8 x 6.6 cm is identified (craniocaudad by transverse by AP) with hematocrit level noted. The remainder of the anterior compartment muscles are unremarkable. Soft tissues Mild soft tissue induration of the left thigh. IMPRESSION: Intramuscular hematoma of the left vastus lateralis muscle measuring 22 x 10.8 x 6.6 cm. No acute osseous abnormality. Electronically Signed   By: Ashley Royalty M.D.   On: 01/09/2018 19:08   Dg Chest Port 1 View  Result Date: 01/09/2018 CLINICAL DATA:  Low hemoglobin, pre transfusion EXAM: PORTABLE CHEST 1 VIEW COMPARISON:  December 23, 2017 FINDINGS: The heart size and mediastinal contours are stable. The heart size is enlarged. Patient status post prior median sternotomy. Both lungs are clear. The visualized skeletal structures are unremarkable. IMPRESSION: Cardiomegaly.  The lungs  are clear. Electronically Signed   By: Abelardo Diesel M.D.   On: 01/09/2018 17:44    Review of Systems  Constitutional: Negative for weight loss.  HENT: Negative for ear discharge, ear pain, hearing loss and tinnitus.   Eyes: Negative for blurred vision, double vision, photophobia and pain.  Respiratory: Negative for cough, sputum production and shortness of breath.   Cardiovascular: Negative for chest pain.  Gastrointestinal: Negative for abdominal pain, nausea and vomiting.  Genitourinary: Negative for dysuria, flank pain, frequency and urgency.  Musculoskeletal: Positive for myalgias (Left thigh). Negative for back pain, falls,  joint pain and neck pain.  Neurological: Negative for dizziness, tingling, sensory change, focal weakness, loss of consciousness and headaches.  Endo/Heme/Allergies: Does not bruise/bleed easily.  Psychiatric/Behavioral: Negative for depression, memory loss and substance abuse. The patient is not nervous/anxious.    Blood pressure (!) 95/54, pulse 80, temperature 98.4 F (36.9 C), temperature source Oral, resp. rate 18, SpO2 100 %. Physical Exam  Constitutional: He appears well-developed and well-nourished. No distress.  HENT:  Head: Normocephalic and atraumatic.  Eyes: Conjunctivae are normal. Right eye exhibits no discharge. Left eye exhibits no discharge. No scleral icterus.  Neck: Normal range of motion.  Cardiovascular: Normal rate and regular rhythm.  Respiratory: Effort normal. No respiratory distress.  Musculoskeletal:  LLE No ecchymosis or rash, small wound anterior thigh from burn from heating pack, dressed  Mild TTP anterior thigh  No knee or ankle effusion  Knee stable to varus/ valgus and anterior/posterior stress  Sens DPN, SPN, TN intact  Motor EHL, ext, flex, evers 5/5  DP 1+, PT 0, 2+ pitting edema  Neurological: He is alert.  Skin: Skin is warm and dry. He is not diaphoretic.  Psychiatric: He has a normal mood and affect. His behavior is normal.    Assessment/Plan: Left thigh hematoma -- Large but not causing any neurovascular compromise. Recommend compression with 6" ACE wraps or thigh-high TED hose. Hematoma will eventually reabsorb but will likely take weeks to months. It's deep enough that I doubt skin necrosis is likely. He should f/u with Dr. Doreatha Martin in office in about 3 weeks. Obviously correction of his INR and maintenance will be key to his recovery. Multiple medical problems including valvular heart disease status post mechanical valve replacement in 1994, coronary artery disease status post CABG and chronic A. fib on chronic anticoagulation on Lovenox and  Coumadin, and symptomatic anemia -- per primary team    Lisette Abu, PA-C Orthopedic Surgery [phone] 01/10/2018, 9:22 AM

## 2018-01-10 NOTE — Progress Notes (Signed)
Pt placed on CPAP for the night.  2L of 02 bleed through.  Mask is secure, spo2 is 98%. Will continue to monitor.

## 2018-01-10 NOTE — Progress Notes (Signed)
Paged MD re: H/H result and diet order. Will advance to carb/mod. Will monitor H/H for now.

## 2018-01-11 LAB — BPAM FFP
BLOOD PRODUCT EXPIRATION DATE: 201910142359
BLOOD PRODUCT EXPIRATION DATE: 201910142359
Blood Product Expiration Date: 201910142359
ISSUE DATE / TIME: 201910100401
ISSUE DATE / TIME: 201910100233
ISSUE DATE / TIME: 201910100541
Unit Type and Rh: 5100
Unit Type and Rh: 6200
Unit Type and Rh: 6200

## 2018-01-11 LAB — CBC
HCT: 23.7 % — ABNORMAL LOW (ref 39.0–52.0)
HEMATOCRIT: 24.9 % — AB (ref 39.0–52.0)
HEMOGLOBIN: 8.2 g/dL — AB (ref 13.0–17.0)
Hemoglobin: 7.8 g/dL — ABNORMAL LOW (ref 13.0–17.0)
MCH: 29.3 pg (ref 26.0–34.0)
MCH: 29.4 pg (ref 26.0–34.0)
MCHC: 32.9 g/dL (ref 30.0–36.0)
MCHC: 32.9 g/dL (ref 30.0–36.0)
MCV: 89.1 fL (ref 80.0–100.0)
MCV: 89.2 fL (ref 80.0–100.0)
Platelets: 158 10*3/uL (ref 150–400)
Platelets: 161 10*3/uL (ref 150–400)
RBC: 2.66 MIL/uL — ABNORMAL LOW (ref 4.22–5.81)
RBC: 2.79 MIL/uL — AB (ref 4.22–5.81)
RDW: 21.3 % — AB (ref 11.5–15.5)
RDW: 20.5 % — ABNORMAL HIGH (ref 11.5–15.5)
WBC: 6.9 10*3/uL (ref 4.0–10.5)
WBC: 6.6 10*3/uL (ref 4.0–10.5)
nRBC: 0.3 % — ABNORMAL HIGH (ref 0.0–0.2)
nRBC: 0.5 % — ABNORMAL HIGH (ref 0.0–0.2)

## 2018-01-11 LAB — GLUCOSE, CAPILLARY
Glucose-Capillary: 104 mg/dL — ABNORMAL HIGH (ref 70–99)
Glucose-Capillary: 109 mg/dL — ABNORMAL HIGH (ref 70–99)
Glucose-Capillary: 107 mg/dL — ABNORMAL HIGH (ref 70–99)
Glucose-Capillary: 109 mg/dL — ABNORMAL HIGH (ref 70–99)

## 2018-01-11 LAB — PROTIME-INR
INR: 1.68
INR: 1.38
PROTHROMBIN TIME: 16.8 s — AB (ref 11.4–15.2)
Prothrombin Time: 19.6 seconds — ABNORMAL HIGH (ref 11.4–15.2)

## 2018-01-11 LAB — PREPARE FRESH FROZEN PLASMA

## 2018-01-11 LAB — APTT: aPTT: 54 seconds — ABNORMAL HIGH (ref 24–36)

## 2018-01-11 LAB — HEMOGLOBIN: HEMOGLOBIN: 7.2 g/dL — AB (ref 13.0–17.0)

## 2018-01-11 LAB — BASIC METABOLIC PANEL
Anion gap: 9 (ref 5–15)
BUN: 39 mg/dL — ABNORMAL HIGH (ref 8–23)
CALCIUM: 8.5 mg/dL — AB (ref 8.9–10.3)
CO2: 25 mmol/L (ref 22–32)
CREATININE: 1.48 mg/dL — AB (ref 0.61–1.24)
Chloride: 97 mmol/L — ABNORMAL LOW (ref 98–111)
GFR calc Af Amer: 50 mL/min — ABNORMAL LOW (ref >60)
GFR calc non Af Amer: 43 mL/min — ABNORMAL LOW (ref >60)
Glucose, Bld: 110 mg/dL — ABNORMAL HIGH (ref 70–99)
Potassium: 4.2 mmol/L (ref 3.5–5.1)
SODIUM: 131 mmol/L — AB (ref 135–145)

## 2018-01-11 LAB — PREPARE RBC (CROSSMATCH)

## 2018-01-11 MED ORDER — ALLOPURINOL 100 MG PO TABS
100.0000 mg | ORAL_TABLET | Freq: Every day | ORAL | Status: DC
  Administered 2018-01-11 – 2018-01-18 (×8): 100 mg via ORAL
  Filled 2018-01-11 (×8): qty 1

## 2018-01-11 MED ORDER — SODIUM CHLORIDE 0.9% IV SOLUTION
Freq: Once | INTRAVENOUS | Status: AC
  Administered 2018-01-11: 10:00:00 via INTRAVENOUS

## 2018-01-11 MED ORDER — ACETAMINOPHEN 325 MG PO TABS
650.0000 mg | ORAL_TABLET | Freq: Four times a day (QID) | ORAL | Status: DC | PRN
  Administered 2018-01-15: 650 mg via ORAL
  Filled 2018-01-11: qty 2

## 2018-01-11 MED ORDER — SALINE SPRAY 0.65 % NA SOLN
1.0000 | NASAL | Status: DC | PRN
  Administered 2018-01-11: 1 via NASAL
  Filled 2018-01-11: qty 44

## 2018-01-11 NOTE — Care Management Note (Addendum)
Case Management Note  Patient Details  Name: [name] MRN: [id number] Date of Birth: 1935-08-23  Subjective/Objective:   From home, presents with anemia at 5.6 secondarey to left leg hematoma, inr elevated, was transfused 5 units prbc's, 3 units ffp, has a mech valve also, afibe , on amio po, chronic diastolic chf, severe MR, chronic hypotension, aki on ckd.  Using bipap.  He had HH with [facility] in the past.  10/16 Tomi Bamberger RN, BSN- per pt eval rec CIR, per CIR consult, patient is appropriate for CIR.                Action/Plan: NCM will follow for transition of care needs.   Expected Discharge Date:                  Expected Discharge Plan:  [place]  In-House Referral:     Discharge planning Services  CM Consult  Post Acute Care Choice:    Choice offered to:     DME Arranged:    DME Agency:     HH Arranged:    [place] Agency:     Status of Service:  In process, will continue to follow  If discussed at Long Length of Stay Meetings, dates discussed:    Additional Comments:  Zenon Mayo, RN 01/11/2018, 9:37 AM

## 2018-01-11 NOTE — Progress Notes (Signed)
Pt stated the full face mask was too bulky wanted to try the nasal mask. Full face mask was switch to a nasal mask but pt still doesn't like it.Marland Kitchen He stated is not like his at home.  Pt requested to be place back on 2L [place].  VS are stable, spo2 is 98. Pt comfortable at this time. Will continue to monitor.

## 2018-01-11 NOTE — Care Management Important Message (Signed)
Important Message  Patient Details  Name: [name] MRN: [id number] Date of Birth: 08-03-1935   Medicare Important Message Given:  Yes    Estee Yohe 01/11/2018, 12:41 PM

## 2018-01-11 NOTE — Progress Notes (Signed)
[facility] TEAM 1 - Stepdown/ICU TEAM  [name]  YTK:[id number] DOB: October 06, 1935 DOA: 01/09/2018 PCP: Cassandria Anger, MD    Brief Narrative:  82 y.o. M w/ a hx of mechanical aortic valve (1994), OSA on CPAP, permanent A. fib, chronic anticoagulant therapy with Coumadin, chronic combined CHF, CKD stage III, DM, CAD with CABG 1994, COPD, severe mitral regurgitation, carotid stenosis with left carotid enterotomy, HTN, and HLD who was admitted with anemia to 5.6 due to left leg hematoma.  INR was noted to be elevated to 10 and hemoglobin down to 7 two days prior at his PCP's office.  He was given a vitamin K tablet. He had a left leg ultrasound concerning for DVT/hematoma that prompted him to come to the ED.  In ED, hemoglobin down to 5.6.  INR 3.8.  He was admitted and transfused blood and FFP.  Orthopedic surgery suggested compression or TED hose but no surgery.  Subjective: Resting comfortably. Reports his L leg is much more comfortable after having been wrapped in compression wrapping. Denies cp, sob, n/v, or abdom pain.   Assessment & Plan:  Acute blood loss anemia / L thigh hematoma CT femur with intramuscular hematoma of the left vastus lateralis muscle measuring 22 x 10.8 x 6.6 cm - Orthopedic surgery evaluated patient and recommended nonoperative management w/ compression stockings or TED hose - transfused 5U PRBC and 3U FFP thus far - awaiting f/u Hgb today   Recent Labs  Lab 01/10/18 0652 01/10/18 1036 01/10/18 1304 01/10/18 2257 01/11/18 0756  HGB 6.6* 6.6* 7.0* 7.2* 7.8*    Supratherapeutic INR S/p 3 units of FFP - INR now below therapeutic range - cont to follow off anticoag for now    Recent Labs  Lab 01/09/18 1358 01/09/18 1435 01/10/18 1036 01/10/18 2257 01/11/18 0756  INR 3.93 3.71 1.81 1.68 1.38    Mechanical aortic valve  Holding anticoagulation in the setting of severe active bleeding - today is day #2 below therapeutic range   CAD status post CABG /  PAD Asymptomatic at this time   Atrial fibrillation continue amiodarone - rate controlled   Chronic diastolic CHF w/ prominent RV failure  Last TTE 10/23/2017, EF 55-60%, severe MR, mechanical AV - followed by CHF Team as outpt - no volume overload on exam at this time   Severe MR Followed by Cardiolgoy w/ consideration begin given to "experimental" intervention   Chronic hypotension  BP stable   DM2 CBG well controlled  Chronic Hyponatremia Na is stable   Recent Labs  Lab 01/07/18 1539 01/09/18 1358 01/09/18 1435  NA 129* 129* 130*    Mild hypokalemia Follow trend - supplement at required   AKI on CKD Prerenal due to blood loss - baseline crt appears to be ~1.5 - cont to follow as crt on an upward trend   Recent Labs  Lab 01/07/18 1539 01/09/18 1358 01/09/18 1435  CREATININE 1.51* 1.85* 1.85*    OSA Nightly CPAP  DVT prophylaxis: SCDs Code Status: FULL CODE Family Communication: no family present at time of exam  Disposition Plan: remain in SDU   Consultants:  Orthopedics   Antimicrobials:  none  Objective: Blood pressure 116/61, pulse 85, temperature 97.8 F (36.6 C), resp. rate 18, SpO2 100 %.  Intake/Output Summary (Last 24 hours) at 01/11/2018 0918 Last data filed at 01/11/2018 0000 Gross per 24 hour  Intake 981.67 ml  Output 1300 ml  Net -318.33 ml   There were no vitals filed  for this visit.  Examination: General: No acute respiratory distress Lungs: Clear to auscultation bilaterally without wheezes or crackles Cardiovascular: Regular rate and without gallop or rub  Abdomen: Nontender, nondistended, soft, bowel sounds positive, no rebound, no ascites, no appreciable mass Extremities: no edema R LE - L LE wrapped w/ 2+ edema appreciated at ankle/foot  CBC: Recent Labs  Lab 01/07/18 1539 01/09/18 1358 01/09/18 1435  01/10/18 1036 01/10/18 1304 01/10/18 2257 01/11/18 0756  WBC 8.7 12.9* 12.8*  --   --   --   --  6.9    NEUTROABS 7.1  --  11.4*  --   --   --   --   --   HGB 7.1 Repeated and verified X2.* 5.3* 5.6*   < > 6.6* 7.0* 7.2* 7.8*  HCT 20.5* 17.1* 17.7*  --  20.2*  --   --  23.7*  MCV 90.3 99.4 100.0  --   --   --   --  89.1  PLT 191.0 253 248  --   --   --   --  158   < > = values in this interval not displayed.   Basic Metabolic Panel: Recent Labs  Lab 01/07/18 1539 01/09/18 1358 01/09/18 1435  NA 129* 129* 130*  K 4.9 5.4* 5.3*  CL 92* 93* 94*  CO2 30 23 23   GLUCOSE 125* 121* 127*  BUN 39* 44* 46*  CREATININE 1.51* 1.85* 1.85*  CALCIUM 8.8 8.5* 8.7*   GFR: Estimated Creatinine Clearance: 37 mL/min (A) (by C-G formula based on SCr of 1.85 mg/dL (H)).  Liver Function Tests: No results for input(s): AST, ALT, ALKPHOS, BILITOT, PROT, ALBUMIN in the last 168 hours. No results for input(s): LIPASE, AMYLASE in the last 168 hours. No results for input(s): AMMONIA in the last 168 hours.  Coagulation Profile: Recent Labs  Lab 01/09/18 1358 01/09/18 1435 01/10/18 1036 01/10/18 2257 01/11/18 0756  INR 3.93 3.71 1.81 1.68 1.38    HbA1C: Hgb A1c MFr Bld  Date/Time Value Ref Range Status  12/07/2017 10:37 AM 4.7 (L) 4.8 - 5.6 % Final    Comment:    (NOTE) Pre diabetes:          5.7%-6.4% Diabetes:              >6.4% Glycemic control for   <7.0% adults with diabetes   10/15/2017 09:17 AM 5.1 4.6 - 6.5 % Final    Comment:    Glycemic Control Guidelines for People with Diabetes:Non Diabetic:  <6%Goal of Therapy: <7%Additional Action Suggested:  >8%     CBG: Recent Labs  Lab 01/10/18 0743 01/10/18 1201 01/10/18 1626 01/10/18 2135 01/11/18 0728  GLUCAP 110* 109* 127* 111* 104*     Scheduled Meds: . sodium chloride   Intravenous Once  . sodium chloride   Intravenous Once  . sodium chloride   Intravenous Once  . amiodarone  200 mg Oral Daily  . insulin aspart  0-9 Units Subcutaneous TID WC  . sodium chloride flush  3 mL Intravenous Q12H     LOS: 2 days    Cherene Altes, MD [facility] Office  [phone] Pager - Text Page per Shea Evans  If 7PM-7AM, please contact night-coverage per Amion 01/11/2018, 9:18 AM

## 2018-01-12 LAB — COMPREHENSIVE METABOLIC PANEL
ALT: 15 U/L (ref 0–44)
AST: 32 U/L (ref 15–41)
Albumin: 2 g/dL — ABNORMAL LOW (ref 3.5–5.0)
Alkaline Phosphatase: 116 U/L (ref 38–126)
Anion gap: 9 (ref 5–15)
BUN: 37 mg/dL — ABNORMAL HIGH (ref 8–23)
CHLORIDE: 96 mmol/L — AB (ref 98–111)
CO2: 24 mmol/L (ref 22–32)
Calcium: 8.2 mg/dL — ABNORMAL LOW (ref 8.9–10.3)
Creatinine, Ser: 1.4 mg/dL — ABNORMAL HIGH (ref 0.61–1.24)
GFR, EST AFRICAN AMERICAN: 53 mL/min — AB (ref >60)
GFR, EST NON AFRICAN AMERICAN: 46 mL/min — AB (ref >60)
GLUCOSE: 112 mg/dL — AB (ref 70–99)
POTASSIUM: 4.2 mmol/L (ref 3.5–5.1)
Sodium: 129 mmol/L — ABNORMAL LOW (ref 135–145)
TOTAL PROTEIN: 5.6 g/dL — AB (ref 6.5–8.1)
Total Bilirubin: 2 mg/dL — ABNORMAL HIGH (ref 0.3–1.2)

## 2018-01-12 LAB — BPAM RBC
BLOOD PRODUCT EXPIRATION DATE: 201910202359
BLOOD PRODUCT EXPIRATION DATE: 201910252359
Blood Product Expiration Date: 201910142359
Blood Product Expiration Date: 201910162359
Blood Product Expiration Date: 201910312359
Blood Product Expiration Date: 201911022359
ISSUE DATE / TIME: 201910091634
ISSUE DATE / TIME: 201910091924
ISSUE DATE / TIME: 201910092314
ISSUE DATE / TIME: 201910101832
ISSUE DATE / TIME: 201910110955
ISSUE DATE / TIME: 201910101511
UNIT TYPE AND RH: 9500
UNIT TYPE AND RH: 9500
UNIT TYPE AND RH: 9500
UNIT TYPE AND RH: 9500
Unit Type and Rh: 9500
Unit Type and Rh: 9500

## 2018-01-12 LAB — TYPE AND SCREEN
ABO/RH(D): O NEG
ANTIBODY SCREEN: NEGATIVE
UNIT DIVISION: 0
UNIT DIVISION: 0
UNIT DIVISION: 0
Unit division: 0
Unit division: 0
Unit division: 0

## 2018-01-12 LAB — GLUCOSE, CAPILLARY
Glucose-Capillary: 109 mg/dL — ABNORMAL HIGH (ref 70–99)
Glucose-Capillary: 113 mg/dL — ABNORMAL HIGH (ref 70–99)
Glucose-Capillary: 105 mg/dL — ABNORMAL HIGH (ref 70–99)
Glucose-Capillary: 116 mg/dL — ABNORMAL HIGH (ref 70–99)

## 2018-01-12 LAB — CBC
HCT: 25 % — ABNORMAL LOW (ref 39.0–52.0)
HEMOGLOBIN: 8.2 g/dL — AB (ref 13.0–17.0)
MCH: 29.7 pg (ref 26.0–34.0)
MCHC: 32.8 g/dL (ref 30.0–36.0)
MCV: 90.6 fL (ref 80.0–100.0)
Platelets: 149 10*3/uL — ABNORMAL LOW (ref 150–400)
RBC: 2.76 MIL/uL — AB (ref 4.22–5.81)
RDW: 20.5 % — ABNORMAL HIGH (ref 11.5–15.5)
WBC: 6.5 10*3/uL (ref 4.0–10.5)
nRBC: 0.3 % — ABNORMAL HIGH (ref 0.0–0.2)

## 2018-01-12 LAB — PROTIME-INR
INR: 1.38
Prothrombin Time: 16.9 seconds — ABNORMAL HIGH (ref 11.4–15.2)

## 2018-01-12 MED ORDER — POTASSIUM CHLORIDE ER 20 MEQ PO TBCR: 40.0000 meq | EXTENDED_RELEASE_TABLET | Freq: Every day | ORAL | Status: DC

## 2018-01-12 MED ORDER — WARFARIN - PHARMACIST DOSING INPATIENT
Freq: Every day | Status: DC
  Administered 2018-01-13: 1

## 2018-01-12 MED ORDER — EZETIMIBE-SIMVASTATIN 10-20 MG PO TABS
1.0000 | ORAL_TABLET | Freq: Every day | ORAL | Status: DC
  Administered 2018-01-12 – 2018-01-17 (×6): 1 via ORAL
  Filled 2018-01-12 (×6): qty 1

## 2018-01-12 MED ORDER — POTASSIUM CHLORIDE CRYS ER 20 MEQ PO TBCR
40.0000 meq | EXTENDED_RELEASE_TABLET | Freq: Every day | ORAL | Status: DC
  Administered 2018-01-12 – 2018-01-16 (×5): 40 meq via ORAL
  Filled 2018-01-12 (×5): qty 2

## 2018-01-12 MED ORDER — WARFARIN SODIUM 5 MG PO TABS
5.0000 mg | ORAL_TABLET | Freq: Once | ORAL | Status: AC
  Administered 2018-01-12: 5 mg via ORAL
  Filled 2018-01-12: qty 1

## 2018-01-12 MED ORDER — TORSEMIDE 20 MG PO TABS
100.0000 mg | ORAL_TABLET | Freq: Two times a day (BID) | ORAL | Status: DC
  Administered 2018-01-12 – 2018-01-18 (×12): 100 mg via ORAL
  Filled 2018-01-12 (×13): qty 5

## 2018-01-12 MED ORDER — POTASSIUM CHLORIDE CRYS ER 20 MEQ PO TBCR
40.0000 meq | EXTENDED_RELEASE_TABLET | ORAL | Status: DC
  Administered 2018-01-14: 40 meq via ORAL
  Filled 2018-01-12 (×3): qty 2

## 2018-01-12 NOTE — Progress Notes (Signed)
Patient refused CPAP at this time.

## 2018-01-12 NOTE — Progress Notes (Addendum)
ANTICOAGULATION CONSULT NOTE - Initial Consult  Pharmacy Consult for WARFARIN Initiation Indication: atrial fibrillation and mechanical aortic valve  Allergies  Allergen Reactions  . Rifampin Rash    May have been caused by Vancomycin or Rifampin (??)  . Vancomycin Rash    May have been caused by Vancomycin or Rifampin (??)    Patient Measurements: Height: 6\' 2"  (188 cm) Weight: 94.2 kg IBW/kg (Calculated) : 82.2   Vital Signs: Temp: 98.1 F (36.7 C) (10/12 1207) Temp Source: Oral (10/12 1207) BP: 101/54 (10/12 1207) Pulse Rate: 82 (10/12 0300)  Labs: Recent Labs    01/09/18 1435  01/10/18 1036  01/10/18 2257 01/11/18 0756 01/11/18 1612 01/12/18 0245  HGB 5.6*   < > 6.6*   < > 7.2* 7.8* 8.2* 8.2*  HCT 17.7*  --  20.2*  --   --  23.7* 24.9* 25.0*  PLT 248  --   --   --   --  158 161 149*  APTT 95*  --  67*  --   --  54*  --   --   LABPROT 36.5*  --  20.8*  --  19.6* 16.8*  --  16.9*  INR 3.71  --  1.81  --  1.68 1.38  --  1.38  CREATININE 1.85*  --   --   --   --  1.48*  --  1.40*   < > = values in this interval not displayed.    Estimated Creatinine Clearance: 48.9 mL/min (A) (by C-G formula based on SCr of 1.4 mg/dL (H)).   Medical History: Past Medical History:  Diagnosis Date  . Carotid artery disease ([place]) 1994   s/p left carotid endarerectomy   . Chronic atrial fibrillation    a. on coumadin   . Chronic diastolic CHF (congestive heart failure) ([place])   . Diabetes mellitus without complication ([place])    dx 2016  . Dyspnea   . Heart murmur   . Hx of CABG    a. 1994  . Hypertension   . OSA (obstructive sleep apnea)   . Rheumatic fever   . S/P AVR (aortic valve replacement)    a. mechanical valve 1996  . Subclavian bypass stenosis ([place])   . 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 Vitamin B12 deficiency     Medications:  Scheduled:  . allopurinol  100 mg Oral Daily  . amiodarone  200 mg  Oral Daily  . insulin aspart  0-9 Units Subcutaneous TID WC  . sodium chloride flush  3 mL Intravenous Q12H    Assessment: Patient is an 64yoM PMH mechanical aortic valve replacement, permanent afib, on warfarin chronically, chronic combined CHF, CKDstage3, DM, CAD with CABG, COPD, severe mitral regurgitation, carotid stenosis with left carotid enterotomy, HTN and HLD.  Patient's PTA warfarin dose was 7.5mg  MWF, 5mg  all other days.   INR was noted to be elevated to 10 and hemoglobin down to 7 two days prior at his PCP's office. Patient given vit K tablet and ultrasound for DVT/hematoma which directed him to the ED.  In the ED, hgb was down to 5.6, INR at 3.8. Patient admitted and transfused blood and FFP.  Orthopedic surgery suggested compression or TED host; no surgery.      Patient as of 10/12 is now s/p 5 units of pRBC and 3 units FFP.   Pharmacy was consulted to reinitiate warfarin without bridge on 10/12,  confirmed with primary team physician. INR today at 1.38, hgb 8.2 and platelets 149.  Drug-drug interactions:  Warfarin + amiodarone  Warfarim + allopurinol Interactions noted and will monitor for additive effects of anticoagulation through daily CBC/INR and clinical monitoring.   Goal of Therapy:  INR 2.5 - 3.5 Monitor platelets by anticoagulation protocol: Yes   Plan:  Warfarin 5mg  PO x1 tonight Monitor daily INR and CBC, and signs / symptoms of bleeding  Thank you for allowing pharmacy to be a part of this patient's care.  Tamela Gammon, PharmD 01/12/2018 1:12 PM PGY-1 Pharmacy Resident Phone: [phone] Please check [email or web address] for unit-specific pharmacist phone numbers after 3:00 P.M.

## 2018-01-12 NOTE — Progress Notes (Signed)
[place] TEAM 1 - Stepdown/ICU TEAM  [name]  VQQ:[id number] DOB: 04-05-1935 DOA: 01/09/2018 PCP: Cassandria Anger, MD    Brief Narrative:  82 y.o. M w/ a hx of mechanical aortic valve (1994), OSA on CPAP, permanent A. fib, chronic anticoagulant therapy with Coumadin, chronic combined CHF, CKD stage III, DM, CAD with CABG 1994, COPD, severe mitral regurgitation, carotid stenosis with left carotid enterotomy, HTN, and HLD who was admitted with anemia to 5.6 due to left leg hematoma.  INR was noted to be elevated to 10 and hemoglobin down to 7 two days prior at his PCP's office.  He was given a vitamin K tablet. He had a left leg ultrasound concerning for DVT/hematoma that prompted him to come to the ED.  In ED, hemoglobin down to 5.6.  INR 3.8.  He was admitted and transfused blood and FFP.  Orthopedic surgery suggested compression or TED hose but no surgery.  Subjective: No new complaints. Is anxious to get back on coumadin and get home. No new pain in the L leg. No cp, sob, n/v, abdom pain.   Assessment & Plan:  Acute blood loss anemia / L thigh hematoma CT femur with intramuscular hematoma of the left vastus lateralis muscle measuring 22 x 10.8 x 6.6 cm - Orthopedic surgery evaluated patient and recommended nonoperative management w/ compression stockings or TED hose - transfused 5U PRBC and 3U FFP thus far - Hgb stable at this time - cont to follow as anticoag resumed    Recent Labs  Lab 01/10/18 1304 01/10/18 2257 01/11/18 0756 01/11/18 1612 01/12/18 0245  HGB 7.0* 7.2* 7.8* 8.2* 8.2*    Supratherapeutic INR S/p 3 units of FFP - likely due to use of ceftin as outpt - counseled pt on interaction w/ most abx and warfarin and need for caution in future - INR now below therapeutic range    Recent Labs  Lab 01/09/18 1435 01/10/18 1036 01/10/18 2257 01/11/18 0756 01/12/18 0245  INR 3.71 1.81 1.68 1.38 1.38    Mechanical aortic valve  Have been holding anticoagulation  in the setting of severe active bleeding - today is day #3 below therapeutic range - resume warfarin alone today, w/ no bridge - if INR still not at goal when pt reaches 7 days below therapeutic will have to consider a bridge   CAD status post CABG / PAD Asymptomatic at this time   Atrial fibrillation continue amiodarone - rate remains controlled   Chronic diastolic CHF w/ prominent RV failure  Last TTE 10/23/2017, EF 55-60%, severe MR, mechanical AV - followed by CHF Team as outpt - no volume overload on exam at this time - slowly begin to resume usual meds before pt trends toward volume overload   Severe MR Followed by Cardiolgoy w/ consideration begin given to "experimental" intervention   Chronic hypotension  BP stable   DM2 CBG well controlled  Chronic Hyponatremia Na is stable - resume diuretic today - follow trend   Recent Labs  Lab 01/07/18 1539 01/09/18 1358 01/09/18 1435 01/11/18 0756 01/12/18 0245  NA 129* 129* 130* 131* 129*    Mild hypokalemia Corrected w/ supplementation   AKI on CKD Prerenal due to blood loss - baseline crt appears to be ~1.5 - cont to follow as crt on an upward trend   Recent Labs  Lab 01/07/18 1539 01/09/18 1358 01/09/18 1435 01/11/18 0756 01/12/18 0245  CREATININE 1.51* 1.85* 1.85* 1.48* 1.40*    OSA Nightly CPAP  DVT prophylaxis: SCDs Code Status: FULL CODE Family Communication: spoke w/ wife at bedside at length  Disposition Plan: SDU  Consultants:  Orthopedics   Antimicrobials:  none  Objective: Blood pressure (!) 101/54, pulse 82, temperature 98.1 F (36.7 C), temperature source Oral, resp. rate 18, SpO2 98 %.  Intake/Output Summary (Last 24 hours) at 01/12/2018 1400 Last data filed at 01/12/2018 0856 Gross per 24 hour  Intake 240 ml  Output 0 ml  Net 240 ml   There were no vitals filed for this visit.  Examination: General: No acute respiratory distress - alert and conversant  Lungs: CTA B - no  wheezing  Cardiovascular: Regular rate - metallic click - no rub  Abdomen: NT/ND, soft, bs+, no mass  Extremities: no edema R LE - L LE wrapped w/ 1+ edema appreciated at ankle/foot - free movement at ankle/toes - intact to sensation L toes   CBC: Recent Labs  Lab 01/07/18 1539  01/09/18 1435  01/11/18 0756 01/11/18 1612 01/12/18 0245  WBC 8.7   < > 12.8*  --  6.9 6.6 6.5  NEUTROABS 7.1  --  11.4*  --   --   --   --   HGB 7.1 Repeated and verified X2.*   < > 5.6*   < > 7.8* 8.2* 8.2*  HCT 20.5*   < > 17.7*   < > 23.7* 24.9* 25.0*  MCV 90.3   < > 100.0  --  89.1 89.2 90.6  PLT 191.0   < > 248  --  158 161 149*   < > = values in this interval not displayed.   Basic Metabolic Panel: Recent Labs  Lab 01/09/18 1435 01/11/18 0756 01/12/18 0245  NA 130* 131* 129*  K 5.3* 4.2 4.2  CL 94* 97* 96*  CO2 23 25 24   GLUCOSE 127* 110* 112*  BUN 46* 39* 37*  CREATININE 1.85* 1.48* 1.40*  CALCIUM 8.7* 8.5* 8.2*   GFR: Estimated Creatinine Clearance: 48.9 mL/min (A) (by C-G formula based on SCr of 1.4 mg/dL (H)).  Liver Function Tests: Recent Labs  Lab 01/12/18 0245  AST 32  ALT 15  ALKPHOS 116  BILITOT 2.0*  PROT 5.6*  ALBUMIN 2.0*    Coagulation Profile: Recent Labs  Lab 01/09/18 1435 01/10/18 1036 01/10/18 2257 01/11/18 0756 01/12/18 0245  INR 3.71 1.81 1.68 1.38 1.38    HbA1C: Hgb A1c MFr Bld  Date/Time Value Ref Range Status  12/07/2017 10:37 AM 4.7 (L) 4.8 - 5.6 % Final    Comment:    (NOTE) Pre diabetes:          5.7%-6.4% Diabetes:              >6.4% Glycemic control for   <7.0% adults with diabetes   10/15/2017 09:17 AM 5.1 4.6 - 6.5 % Final    Comment:    Glycemic Control Guidelines for People with Diabetes:Non Diabetic:  <6%Goal of Therapy: <7%Additional Action Suggested:  >8%     CBG: Recent Labs  Lab 01/11/18 1225 01/11/18 1619 01/11/18 2138 01/12/18 0802 01/12/18 1209  GLUCAP 109* 107* 109* 109* 113*     Scheduled Meds: .  allopurinol  100 mg Oral Daily  . amiodarone  200 mg Oral Daily  . insulin aspart  0-9 Units Subcutaneous TID WC  . sodium chloride flush  3 mL Intravenous Q12H  . warfarin  5 mg Oral ONCE-1800  . Warfarin - Pharmacist Dosing Inpatient   Does not apply  [id number]     LOS: 3 days   Cherene Altes, MD [facility] Office  [phone] Pager - Text Page per Amion  If 7PM-7AM, please contact night-coverage per Amion 01/12/2018, 2:00 PM

## 2018-01-12 NOTE — Plan of Care (Signed)
Discussed plan of care for the evening with patient.  Emphasized using the call button when assistance is needed as well as pain management.  Patient was concerned about not taking coumadin.   This RN explained his PT/INR levels are currently elevated and he will resume taking coumadin when those levels are lower.  Patient understood.  Good teach back displayed.

## 2018-01-13 LAB — PROTIME-INR
INR: 1.29
Prothrombin Time: 16 seconds — ABNORMAL HIGH (ref 11.4–15.2)

## 2018-01-13 LAB — CBC
HCT: 24.4 % — ABNORMAL LOW (ref 39.0–52.0)
Hemoglobin: 7.5 g/dL — ABNORMAL LOW (ref 13.0–17.0)
MCH: 28.7 pg (ref 26.0–34.0)
MCHC: 30.7 g/dL (ref 30.0–36.0)
MCV: 93.5 fL (ref 80.0–100.0)
Platelets: 168 K/uL (ref 150–400)
RBC: 2.61 MIL/uL — ABNORMAL LOW (ref 4.22–5.81)
RDW: 21 % — ABNORMAL HIGH (ref 11.5–15.5)
WBC: 5.5 K/uL (ref 4.0–10.5)
nRBC: 0 % (ref 0.0–0.2)

## 2018-01-13 LAB — BASIC METABOLIC PANEL WITH GFR
Anion gap: 9 (ref 5–15)
BUN: 32 mg/dL — ABNORMAL HIGH (ref 8–23)
CO2: 25 mmol/L (ref 22–32)
Calcium: 8 mg/dL — ABNORMAL LOW (ref 8.9–10.3)
Chloride: 96 mmol/L — ABNORMAL LOW (ref 98–111)
Creatinine, Ser: 1.29 mg/dL — ABNORMAL HIGH (ref 0.61–1.24)
GFR calc Af Amer: 59 mL/min — ABNORMAL LOW
GFR calc non Af Amer: 51 mL/min — ABNORMAL LOW
Glucose, Bld: 102 mg/dL — ABNORMAL HIGH (ref 70–99)
Potassium: 3.6 mmol/L (ref 3.5–5.1)
Sodium: 130 mmol/L — ABNORMAL LOW (ref 135–145)

## 2018-01-13 LAB — MAGNESIUM: Magnesium: 2 mg/dL (ref 1.7–2.4)

## 2018-01-13 LAB — GLUCOSE, CAPILLARY
GLUCOSE-CAPILLARY: 98 mg/dL (ref 70–99)
Glucose-Capillary: 138 mg/dL — ABNORMAL HIGH (ref 70–99)
Glucose-Capillary: 134 mg/dL — ABNORMAL HIGH (ref 70–99)
Glucose-Capillary: 121 mg/dL — ABNORMAL HIGH (ref 70–99)

## 2018-01-13 MED ORDER — SENNOSIDES-DOCUSATE SODIUM 8.6-50 MG PO TABS
1.0000 | ORAL_TABLET | Freq: Two times a day (BID) | ORAL | Status: DC
  Administered 2018-01-13 – 2018-01-18 (×6): 1 via ORAL
  Filled 2018-01-13 (×9): qty 1

## 2018-01-13 MED ORDER — MAGNESIUM CITRATE PO SOLN
0.5000 | Freq: Every day | ORAL | Status: DC | PRN
  Administered 2018-01-13: 0.5 via ORAL
  Filled 2018-01-13: qty 296

## 2018-01-13 MED ORDER — WARFARIN SODIUM 7.5 MG PO TABS
7.5000 mg | ORAL_TABLET | Freq: Once | ORAL | Status: AC
  Administered 2018-01-13: 7.5 mg via ORAL
  Filled 2018-01-13: qty 1

## 2018-01-13 MED ORDER — POLYETHYLENE GLYCOL 3350 17 G PO PACK
17.0000 g | PACK | Freq: Every day | ORAL | Status: DC
  Administered 2018-01-13 – 2018-01-17 (×4): 17 g via ORAL
  Filled 2018-01-13 (×6): qty 1

## 2018-01-13 NOTE — Progress Notes (Signed)
[place] TEAM 1 - Stepdown/ICU TEAM  [name]  [id number] DOB: 26-Dec-1935 DOA: 01/09/2018 PCP: Cassandria Anger, MD    Brief Narrative:  82 y.o. M w/ a hx of mechanical aortic valve (1994), OSA on CPAP, permanent A. fib, chronic anticoagulant therapy with Coumadin, chronic combined CHF, CKD stage III, DM, CAD with CABG 1994, COPD, severe mitral regurgitation, carotid stenosis with left carotid enterotomy, HTN, and HLD who was admitted with anemia to 5.6 due to left leg hematoma.  INR was noted to be elevated to 10 and hemoglobin down to 7 two days prior at his PCP's office.  He was given a vitamin K tablet. He had a left leg ultrasound concerning for DVT/hematoma that prompted him to come to the ED.  In ED, hemoglobin down to 5.6.  INR 3.8.  He was admitted and transfused blood and FFP.  Orthopedic surgery suggested compression or TED hose but no surgery.  Subjective: Resting comfortably in bed. No new complaints at this time. No new or worsening pain in his L thigh.   Assessment & Plan:  Acute blood loss anemia / L thigh hematoma CT femur with intramuscular hematoma of the left vastus lateralis muscle measuring 22 x 10.8 x 6.6 cm - Orthopedic surgery evaluated patient and recommended nonoperative management w/ compression stockings or TED hose - transfused 5U PRBC and 3U FFP thus far - Hgb may be on decline but will need to watch further to establish trend - cont to follow on anticoag  Recent Labs  Lab 01/10/18 2257 01/11/18 0756 01/11/18 1612 01/12/18 0245 01/13/18 0239  HGB 7.2* 7.8* 8.2* 8.2* 7.5*    Supratherapeutic INR S/p 3 units of FFP - likely due to use of ceftin as outpt - counseled pt on interaction w/ most abx and warfarin and need for caution in future - INR remains below therapeutic range    Recent Labs  Lab 01/10/18 1036 01/10/18 2257 01/11/18 0756 01/12/18 0245 01/13/18 0239  INR 1.81 1.68 1.38 1.38 1.29    Mechanical aortic valve  Had been  holding anticoagulation in the setting of severe active bleeding - today is day #4 below therapeutic range - resumed warfarin alone 10/12, w/ no bridge - if INR still not at goal when pt reaches 7 days below therapeutic will have to consider a bridge   CAD status post CABG / PAD Asymptomatic at this time   Atrial fibrillation continue amiodarone - rate remains controlled   Chronic diastolic CHF w/ prominent RV failure  Last TTE 10/23/2017, EF 55-60%, severe MR, mechanical AV - followed by CHF Team as outpt - no volume overload on exam at this time - now back on usual home CHF meds    Severe MR Followed by Cardiolgoy w/ consideration begin given to "experimental" intervention   Chronic hypotension  BP stable   DM2 CBG well controlled  Chronic Hyponatremia Na is stable - resumed diuretic - follow trend   Recent Labs  Lab 01/09/18 1358 01/09/18 1435 01/11/18 0756 01/12/18 0245 01/13/18 0239  NA 129* 130* 131* 129* 130*    AKI on CKD Prerenal due to blood loss - baseline crt appears to be ~1.5 - crt appears to be stable   Recent Labs  Lab 01/09/18 1358 01/09/18 1435 01/11/18 0756 01/12/18 0245 01/13/18 0239  CREATININE 1.85* 1.85* 1.48* 1.40* 1.29*    OSA Nightly CPAP  DVT prophylaxis: SCDs Code Status: FULL CODE Family Communication: spoke w/ wife at bedside   Disposition  Plan: stable for transfer to tele bed status   Consultants:  Orthopedics   Antimicrobials:  none  Objective: Blood pressure (!) 97/44, pulse 75, temperature 99.4 F (37.4 C), temperature source Oral, resp. rate (!) 24, weight 99 kg, SpO2 98 %.  Intake/Output Summary (Last 24 hours) at 01/13/2018 0926 Last data filed at 01/13/2018 0500 Gross per 24 hour  Intake -  Output 2600 ml  Net -2600 ml   Filed Weights   01/12/18 2300  Weight: 99 kg    Examination: General: NAD Lungs: CTA B = Cardiovascular: Regular rate - metallic click  Abdomen: NT/ND, soft, bs+, no mass    Extremities: no edema R LE - L LE wrapped w/ trace edema appreciated at ankle/foot - free movement at ankle/toes - intact to sensation L toes   CBC: Recent Labs  Lab 01/07/18 1539  01/09/18 1435  01/11/18 1612 01/12/18 0245 01/13/18 0239  WBC 8.7   < > 12.8*   < > 6.6 6.5 5.5  NEUTROABS 7.1  --  11.4*  --   --   --   --   HGB 7.1 Repeated and verified X2.*   < > 5.6*   < > 8.2* 8.2* 7.5*  HCT 20.5*   < > 17.7*   < > 24.9* 25.0* 24.4*  MCV 90.3   < > 100.0   < > 89.2 90.6 93.5  PLT 191.0   < > 248   < > 161 149* 168   < > = values in this interval not displayed.   Basic Metabolic Panel: Recent Labs  Lab 01/11/18 0756 01/12/18 0245 01/13/18 0239  NA 131* 129* 130*  K 4.2 4.2 3.6  CL 97* 96* 96*  CO2 25 24 25   GLUCOSE 110* 112* 102*  BUN 39* 37* 32*  CREATININE 1.48* 1.40* 1.29*  CALCIUM 8.5* 8.2* 8.0*  MG  --   --  2.0   GFR: Estimated Creatinine Clearance: 57.4 mL/min (A) (by C-G formula based on SCr of 1.29 mg/dL (H)).  Liver Function Tests: Recent Labs  Lab 01/12/18 0245  AST 32  ALT 15  ALKPHOS 116  BILITOT 2.0*  PROT 5.6*  ALBUMIN 2.0*    Coagulation Profile: Recent Labs  Lab 01/10/18 1036 01/10/18 2257 01/11/18 0756 01/12/18 0245 01/13/18 0239  INR 1.81 1.68 1.38 1.38 1.29    HbA1C: Hgb A1c MFr Bld  Date/Time Value Ref Range Status  12/07/2017 10:37 AM 4.7 (L) 4.8 - 5.6 % Final    Comment:    (NOTE) Pre diabetes:          5.7%-6.4% Diabetes:              >6.4% Glycemic control for   <7.0% adults with diabetes   10/15/2017 09:17 AM 5.1 4.6 - 6.5 % Final    Comment:    Glycemic Control Guidelines for People with Diabetes:Non Diabetic:  <6%Goal of Therapy: <7%Additional Action Suggested:  >8%     CBG: Recent Labs  Lab 01/12/18 0802 01/12/18 1209 01/12/18 1711 01/12/18 2210 01/13/18 0727  GLUCAP 109* 113* 105* 116* 98     Scheduled Meds: . allopurinol  100 mg Oral Daily  . amiodarone  200 mg Oral Daily  .  ezetimibe-simvastatin  1 tablet Oral QHS  . insulin aspart  0-9 Units Subcutaneous TID WC  . potassium chloride  40 mEq Oral Daily  . [START ON 01/14/2018] potassium chloride  40 mEq Oral Weekly  . sodium chloride flush  3 mL Intravenous Q12H  . torsemide  100 mg Oral BID  . warfarin  7.5 mg Oral ONCE-1800  . Warfarin - Pharmacist Dosing Inpatient   Does not apply q1800     LOS: 4 days   Cherene Altes, MD [facility] Office  [phone] Pager - Text Page per Amion  If 7PM-7AM, please contact night-coverage per Amion 01/13/2018, 9:26 AM

## 2018-01-13 NOTE — Progress Notes (Signed)
Patient refused CPAP. Patient states he does not wish to use CPAP during his hospital stay. Equipment removed from room.

## 2018-01-13 NOTE — Progress Notes (Addendum)
ANTICOAGULATION CONSULT NOTE - follow-up Consult  Pharmacy Consult for WARFARIN Indication: atrial fibrillation and mechanical aortic valve  Allergies  Allergen Reactions  . Rifampin Rash    May have been caused by Vancomycin or Rifampin (??)  . Vancomycin Rash    May have been caused by Vancomycin or Rifampin (??)    Patient Measurements: Height: 6\' 2"  (188 cm) Weight: 94.2 kg IBW/kg (Calculated) : 82.2   Vital Signs: Temp: 99.4 F (37.4 C) (10/13 0802) Temp Source: Oral (10/13 0802) BP: 97/44 (10/13 0805) Pulse Rate: 75 (10/13 0805)  Labs: Recent Labs    01/10/18 1036  01/11/18 0756 01/11/18 1612 01/12/18 0245 01/13/18 0239  HGB 6.6*   < > 7.8* 8.2* 8.2* 7.5*  HCT 20.2*  --  23.7* 24.9* 25.0* 24.4*  PLT  --    < > 158 161 149* 168  APTT 67*  --  54*  --   --   --   LABPROT 20.8*   < > 16.8*  --  16.9* 16.0*  INR 1.81   < > 1.38  --  1.38 1.29  CREATININE  --   --  1.48*  --  1.40* 1.29*   < > = values in this interval not displayed.    Estimated Creatinine Clearance: 57.4 mL/min (A) (by C-G formula based on SCr of 1.29 mg/dL (H)).   Medical History: Past Medical History:  Diagnosis Date  . Carotid artery disease ([place]) 1994   s/p left carotid endarerectomy   . Chronic atrial fibrillation    a. on coumadin   . Chronic diastolic CHF (congestive heart failure) ([place])   . Diabetes mellitus without complication ([place])    dx 2016  . Dyspnea   . Heart murmur   . Hx of CABG    a. 1994  . Hypertension   . OSA (obstructive sleep apnea)   . Rheumatic fever   . S/P AVR (aortic valve replacement)    a. mechanical valve 1996  . Subclavian bypass stenosis ([place])   . 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 Vitamin B12 deficiency     Medications:  Scheduled:  . allopurinol  100 mg Oral Daily  . amiodarone  200 mg Oral Daily  . ezetimibe-simvastatin  1 tablet Oral QHS  . insulin aspart  0-9 Units  Subcutaneous TID WC  . potassium chloride  40 mEq Oral Daily  . [START ON 01/14/2018] potassium chloride  40 mEq Oral Weekly  . sodium chloride flush  3 mL Intravenous Q12H  . torsemide  100 mg Oral BID  . Warfarin - Pharmacist Dosing Inpatient   Does not apply q1800    Assessment: Patient is an 66yoM PMH mechanical aortic valve replacement, permanent afib, on warfarin chronically, chronic combined CHF, CKDstage3, DM, CAD with CABG, COPD, severe mitral regurgitation, carotid stenosis with left carotid enterotomy, HTN and HLD.  Patient's PTA warfarin dose was 7.5mg  MWF, 5mg  all other days.   INR was noted to be elevated to 10 and hemoglobin down to 7 two days prior at his PCP's office. Patient given vit K tablet and ultrasound for DVT/hematoma which directed him to the ED.  In the ED, hgb was down to 5.6, INR at 3.8. Patient admitted and transfused blood and FFP.  Orthopedic surgery suggested compression or TED host; no surgery.  Patient as of 10/12 is s/p 5 units of pRBC and 3 units FFP.  Pharmacy was  consulted to reinitiate warfarin without bridge on 10/12, confirmed with primary team physician.   10/13: INR today at 1.29 down from 1.38 Hgb 7.5 down from 8.2 Platelets 168, up from 149  Spoke with nurse and patient this morning: confirmed no bleeding.  Will increase warfarin dose today.  Drug-drug interactions:  Warfarin + amiodarone  Warfarim + allopurinol Interactions noted and will monitor for additive effects of anticoagulation through daily CBC/INR and clinical monitoring.   Goal of Therapy:  INR 2.5 - 3.5 Monitor platelets by anticoagulation protocol: Yes   Plan:  Warfarin 7.5mg  PO x1 tonight Monitor daily INR and CBC, and signs / symptoms of bleeding  Thank you for allowing pharmacy to be a part of this patient's care.  Tamela Gammon, PharmD 01/13/2018 8:40 AM PGY-1 Pharmacy Resident Phone: [phone] Please check [email or web address] for unit-specific pharmacist phone  numbers after 3:00 P.M.

## 2018-01-14 LAB — BASIC METABOLIC PANEL
ANION GAP: 9 (ref 5–15)
BUN: 30 mg/dL — ABNORMAL HIGH (ref 8–23)
CALCIUM: 7.9 mg/dL — AB (ref 8.9–10.3)
CO2: 27 mmol/L (ref 22–32)
CREATININE: 1.12 mg/dL (ref 0.61–1.24)
Chloride: 95 mmol/L — ABNORMAL LOW (ref 98–111)
GFR calc Af Amer: >60 mL/min (ref >60)
GFR calc non Af Amer: >60 mL/min (ref >60)
GLUCOSE: 115 mg/dL — AB (ref 70–99)
Potassium: 3.1 mmol/L — ABNORMAL LOW (ref 3.5–5.1)
Sodium: 131 mmol/L — ABNORMAL LOW (ref 135–145)

## 2018-01-14 LAB — CBC
HCT: 24.1 % — ABNORMAL LOW (ref 39.0–52.0)
HEMATOCRIT: 27.6 % — AB (ref 39.0–52.0)
HEMOGLOBIN: 7.6 g/dL — AB (ref 13.0–17.0)
Hemoglobin: 8.6 g/dL — ABNORMAL LOW (ref 13.0–17.0)
MCH: 28.9 pg (ref 26.0–34.0)
MCH: 28.7 pg (ref 26.0–34.0)
MCHC: 31.5 g/dL (ref 30.0–36.0)
MCHC: 31.2 g/dL (ref 30.0–36.0)
MCV: 91.6 fL (ref 80.0–100.0)
MCV: 92 fL (ref 80.0–100.0)
NRBC: 0 % (ref 0.0–0.2)
PLATELETS: 165 10*3/uL (ref 150–400)
PLATELETS: 174 10*3/uL (ref 150–400)
RBC: 2.63 MIL/uL — AB (ref 4.22–5.81)
RBC: 3 MIL/uL — ABNORMAL LOW (ref 4.22–5.81)
RDW: 20.9 % — ABNORMAL HIGH (ref 11.5–15.5)
RDW: 19.9 % — ABNORMAL HIGH (ref 11.5–15.5)
WBC: 4.8 10*3/uL (ref 4.0–10.5)
WBC: 4.9 10*3/uL (ref 4.0–10.5)
nRBC: 0 % (ref 0.0–0.2)

## 2018-01-14 LAB — GLUCOSE, CAPILLARY
GLUCOSE-CAPILLARY: 134 mg/dL — AB (ref 70–99)
GLUCOSE-CAPILLARY: 111 mg/dL — AB (ref 70–99)
GLUCOSE-CAPILLARY: 116 mg/dL — AB (ref 70–99)
Glucose-Capillary: 104 mg/dL — ABNORMAL HIGH (ref 70–99)

## 2018-01-14 LAB — PROTIME-INR
INR: 1.31
PROTHROMBIN TIME: 16.1 s — AB (ref 11.4–15.2)

## 2018-01-14 LAB — PREPARE RBC (CROSSMATCH)

## 2018-01-14 MED ORDER — SODIUM CHLORIDE 0.9% IV SOLUTION
Freq: Once | INTRAVENOUS | Status: AC
  Administered 2018-01-14: 10:00:00 via INTRAVENOUS

## 2018-01-14 MED ORDER — WARFARIN SODIUM 7.5 MG PO TABS
7.5000 mg | ORAL_TABLET | Freq: Once | ORAL | Status: AC
  Administered 2018-01-14: 7.5 mg via ORAL
  Filled 2018-01-14: qty 1

## 2018-01-14 NOTE — Progress Notes (Signed)
ANTICOAGULATION CONSULT NOTE - follow-up Consult  Pharmacy Consult for WARFARIN Indication: atrial fibrillation and mechanical aortic valve  Allergies  Allergen Reactions  . Rifampin Rash    May have been caused by Vancomycin or Rifampin (??)  . Vancomycin Rash    May have been caused by Vancomycin or Rifampin (??)    Patient Measurements: Height: 6\' 2"  (188 cm) Weight: 94.2 kg IBW/kg (Calculated) : 82.2   Vital Signs: Temp: 97.5 F (36.4 C) (10/14 0829) Temp Source: Oral (10/14 0829) BP: 98/50 (10/14 0850) Pulse Rate: 73 (10/14 0850)  Labs: Recent Labs    01/12/18 0245 01/13/18 0239 01/14/18 0233  HGB 8.2* 7.5* 7.6*  HCT 25.0* 24.4* 24.1*  PLT 149* 168 165  LABPROT 16.9* 16.0* 16.1*  INR 1.38 1.29 1.31  CREATININE 1.40* 1.29* 1.12    Estimated Creatinine Clearance: 66.1 mL/min (by C-G formula based on SCr of 1.12 mg/dL).   Medical History: Past Medical History:  Diagnosis Date  . Carotid artery disease ([place]) 1994   s/p left carotid endarerectomy   . Chronic atrial fibrillation    a. on coumadin   . Chronic diastolic CHF (congestive heart failure) ([place])   . Diabetes mellitus without complication ([place])    dx 2016  . Dyspnea   . Heart murmur   . Hx of CABG    a. 1994  . Hypertension   . OSA (obstructive sleep apnea)   . Rheumatic fever   . S/P AVR (aortic valve replacement)    a. mechanical valve 1996  . Subclavian bypass stenosis ([place])   . 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 Vitamin B12 deficiency     Medications:  Scheduled:  . sodium chloride   Intravenous Once  . allopurinol  100 mg Oral Daily  . amiodarone  200 mg Oral Daily  . ezetimibe-simvastatin  1 tablet Oral QHS  . insulin aspart  0-9 Units Subcutaneous TID WC  . polyethylene glycol  17 g Oral Daily  . potassium chloride  40 mEq Oral Daily  . potassium chloride  40 mEq Oral Weekly  . senna-docusate  1 tablet Oral BID  .  sodium chloride flush  3 mL Intravenous Q12H  . torsemide  100 mg Oral BID  . Warfarin - Pharmacist Dosing Inpatient   Does not apply q1800    Assessment: Patient is an 89yoM PMH mechanical aortic valve replacement, permanent afib, on warfarin chronically, chronic combined CHF, CKDstage3, DM, CAD with CABG, COPD, severe mitral regurgitation, carotid stenosis with left carotid enterotomy, HTN and HLD.  Patient's PTA warfarin dose was 7.5mg  MWF, 5mg  all other days.   INR was noted to be elevated to 10 and hemoglobin down to 7 two days prior at his PCP's office. Patient given vit K tablet and ultrasound for DVT/hematoma which directed him to the ED.  In the ED, hgb was down to 5.6, INR at 3.8. Patient admitted and transfused blood and FFP.  Orthopedic surgery suggested compression or TED host; no surgery.  Patient as of 10/12 is s/p 5 units of pRBC and 3 units FFP.  Pharmacy was consulted to reinitiate warfarin without bridge on 10/12, confirmed with primary team physician.   INR is 1.31 today. We will repeat the 7.5mg  dose.   Drug-drug interactions:  Warfarin + amiodarone  Warfarim + allopurinol Interactions noted and will monitor for additive effects of anticoagulation through daily CBC/INR and clinical monitoring.   Goal  of Therapy:  INR 2.5 - 3.5 Monitor platelets by anticoagulation protocol: Yes   Plan:  Warfarin 7.5mg  PO x1 tonight Monitor daily INR and CBC, and signs / symptoms of bleeding  Onnie Boer, PharmD, Jake Samples, AAHIVP, CPP Infectious Disease Pharmacist Pager: [phone] 01/14/2018 9:21 AM

## 2018-01-14 NOTE — Progress Notes (Addendum)
[facility] Progress Note  Patient: [name] AYT:[id number]   PCP: Cassandria Anger, MD DOB: May 16, 1935   DOA: 01/09/2018   DOS: 01/14/2018   Date of Service: the patient was seen and examined on 01/14/2018  Brief hospital course: Pt. with PMH of chronic A. fib on Coumadin, PVD S/P left CEA, chronic diastolic CHF, type II DM, CAD S/P CABG, HTN, OSA, mechanical AVR, B12 deficiency; admitted on 01/09/2018, presented with complaint of shortness of breath, weakness and left leg swelling, was found to have chronic anemia for hemoglobin of 5.6 and supratherapeutic INR. Currently further plan is close monitoring of INR and H&H.  Subjective: Feeling better.  He is able to move his left leg more without any pain.  Still has difficulty standing up due to weakness.  No chest pain no dizziness no lightheadedness.  No bleeding anywhere else.  Assessment and Plan: 1.  Acute blood loss anemia. Symptomatic. Left thigh hematoma. Supra therapeutic INR. Presented with hemoglobin of 5.6.  S/P 5 PRBC transfusion and 3 FFP transfusion. Hemoglobin stable. Orthopedic surgery was consulted recommend nonoperative management with compression stockings and follow-up in 3 weeks. Patient mentions his pain and mobility is improving. No evidence of compartment syndrome for now. Blood pressure borderline, hemoglobin 7.5, will transfuse 1 PRBC.  Outpatient blood pressure recently 110-120 range. PT consulted.  2.  Chronic A. fib. Mechanical aortic valve Supra therapeutic INR. S/P 3 units of FFP and 5 PRBC. Currently rate controlled. Monitor on telemetry. Patient does have a mechanical aortic valve for which she will require anti-coagulation. Today's day 5 of subtherapeutic INR. Warfarin resumed without any bridging. If on 01/17/2018 the patient INR is not therapeutic and he will require bridging therapy. Patient will require close monitoring for at least 24 hours after his INR is therapeutic to ensure  his H&H remained stable. Continue amiodarone.  3.  Chronic diastolic CHF. Severe MR CAD S/P CABG Echocardiogram July 2019 shows 50 to 55% EF.  Severe MR, Seen by CHF team on the day of the discharge. Continue home medication for now.  Including torsemide, Aldactone Not a mitral clip candidate. Outpatient follow-up with cardio thoracic surgery. Continue Vytorin.  4.  OSA. Continue nightly BiPAP.  5.  Gout. Continue allopurinol.  6. Acute kidney injury on chronic kidney disease stage III. Baseline renal function between 1.3 and 1.4 Presented with mild worsening of 1.85.  Now 1.12.  6.  Hypokalemia. Replacing.  We will recheck tomorrow.  7.  COPD. Chronic hypoxic respiratory failure. Patient is on home oxygen.  Monitor.  Continue inhalers.  Diet: Cardiac diet DVT Prophylaxis: mechanical compression device on therapeutic anticoagulation.  Advance goals of care discussion: full code  Family Communication: no family was present at bedside, at the time of interview.   Disposition:  Discharge to be determined.  Consultants: orthopedics  Procedures: PRBC  Scheduled Meds: . allopurinol  100 mg Oral Daily  . amiodarone  200 mg Oral Daily  . ezetimibe-simvastatin  1 tablet Oral QHS  . insulin aspart  0-9 Units Subcutaneous TID WC  . polyethylene glycol  17 g Oral Daily  . potassium chloride  40 mEq Oral Daily  . potassium chloride  40 mEq Oral Weekly  . senna-docusate  1 tablet Oral BID  . sodium chloride flush  3 mL Intravenous Q12H  . torsemide  100 mg Oral BID  . Warfarin - Pharmacist Dosing Inpatient   Does not apply q1800   Continuous Infusions: . sodium chloride  PRN Meds: sodium chloride, acetaminophen, magnesium citrate, ondansetron **OR** ondansetron (ZOFRAN) IV, sodium chloride, sodium chloride flush Antibiotics: Anti-infectives (From admission, onward)   None       Objective: Physical Exam: Vitals:   01/14/18 1154 01/14/18 1235 01/14/18 1451  01/14/18 1738  BP: 100/72 (!) 110/52 (!) 104/58 (!) 97/49  Pulse: 82 81 66 75  Resp: 16 18 18    Temp: (!) 97.4 F (36.3 C) 98.1 F (36.7 C)  98.4 F (36.9 C)  TempSrc: Oral Oral  Oral  SpO2: 100% 100% 100% 99%  Weight:        Intake/Output Summary (Last 24 hours) at 01/14/2018 1820 Last data filed at 01/14/2018 1805 Gross per 24 hour  Intake 315 ml  Output 2300 ml  Net -1985 ml   Filed Weights   01/12/18 2300  Weight: 99 kg   General: Alert, Awake and Oriented to Time, Place and Person. Appear in mild distress, affect appropriate Eyes: PERRL, Conjunctiva normal ENT: Oral Mucosa clear moist. Neck: no JVD, no Abnormal Mass Or lumps Cardiovascular: S1 and S2 Present, no Murmur, Peripheral Pulses Present Respiratory: normal respiratory effort, Bilateral Air entry equal and Decreased, no use of accessory muscle, Clear to Auscultation, no Crackles, no wheezes Abdomen: Bowel Sound present, Soft and no tenderness, no hernia Skin: no redness, no Rash, no induration Extremities: right Pedal edema, right calf tenderness Neurologic: Grossly no focal neuro deficit. Bilaterally Equal motor strength  Data Reviewed: CBC: Recent Labs  Lab 01/09/18 1435  01/11/18 1612 01/12/18 0245 01/13/18 0239 01/14/18 0233 01/14/18 1801  WBC 12.8*   < > 6.6 6.5 5.5 4.8 4.9  NEUTROABS 11.4*  --   --   --   --   --   --   HGB 5.6*   < > 8.2* 8.2* 7.5* 7.6* 8.6*  HCT 17.7*   < > 24.9* 25.0* 24.4* 24.1* 27.6*  MCV 100.0   < > 89.2 90.6 93.5 91.6 92.0  PLT 248   < > 161 149* 168 165 174   < > = values in this interval not displayed.   Basic Metabolic Panel: Recent Labs  Lab 01/09/18 1435 01/11/18 0756 01/12/18 0245 01/13/18 0239 01/14/18 0233  NA 130* 131* 129* 130* 131*  K 5.3* 4.2 4.2 3.6 3.1*  CL 94* 97* 96* 96* 95*  CO2 23 25 24 25 27   GLUCOSE 127* 110* 112* 102* 115*  BUN 46* 39* 37* 32* 30*  CREATININE 1.85* 1.48* 1.40* 1.29* 1.12  CALCIUM 8.7* 8.5* 8.2* 8.0* 7.9*  MG  --   --    --  2.0  --     Liver Function Tests: Recent Labs  Lab 01/12/18 0245  AST 32  ALT 15  ALKPHOS 116  BILITOT 2.0*  PROT 5.6*  ALBUMIN 2.0*   No results for input(s): LIPASE, AMYLASE in the last 168 hours. No results for input(s): AMMONIA in the last 168 hours. Coagulation Profile: Recent Labs  Lab 01/10/18 2257 01/11/18 0756 01/12/18 0245 01/13/18 0239 01/14/18 0233  INR 1.68 1.38 1.38 1.29 1.31   Cardiac Enzymes: No results for input(s): CKTOTAL, CKMB, CKMBINDEX, TROPONINI in the last 168 hours. BNP (last 3 results) Recent Labs    03/30/17 1147 05/25/17 1044 07/10/17 1523  PROBNP 1,858* 2,749* 411.0*   CBG: Recent Labs  Lab 01/13/18 1558 01/13/18 2212 01/14/18 0748 01/14/18 1159 01/14/18 1736  GLUCAP 134* 121* 104* 134* 111*   Studies: No results found.   Time spent: 35 minutes  Author: Berle Mull, MD [facility] Pager: [phone] 01/14/2018 6:20 PM  If 7PM-7AM, please contact night-coverage at [email or web address], password [facility]

## 2018-01-14 NOTE — Progress Notes (Signed)
RT Note:  Patient refused CPAP for HS.

## 2018-01-15 DIAGNOSIS — T148XXA Other injury of unspecified body region, initial encounter: Secondary | ICD-10-CM

## 2018-01-15 DIAGNOSIS — R5381 Other malaise: Secondary | ICD-10-CM

## 2018-01-15 LAB — CBC
HEMATOCRIT: 26.1 % — AB (ref 39.0–52.0)
Hemoglobin: 8.4 g/dL — ABNORMAL LOW (ref 13.0–17.0)
MCH: 29.6 pg (ref 26.0–34.0)
MCHC: 32.2 g/dL (ref 30.0–36.0)
MCV: 91.9 fL (ref 80.0–100.0)
NRBC: 0 % (ref 0.0–0.2)
Platelets: 173 10*3/uL (ref 150–400)
RBC: 2.84 MIL/uL — AB (ref 4.22–5.81)
RDW: 19.9 % — ABNORMAL HIGH (ref 11.5–15.5)
WBC: 4.4 10*3/uL (ref 4.0–10.5)

## 2018-01-15 LAB — GLUCOSE, CAPILLARY
GLUCOSE-CAPILLARY: 135 mg/dL — AB (ref 70–99)
GLUCOSE-CAPILLARY: 186 mg/dL — AB (ref 70–99)
GLUCOSE-CAPILLARY: 124 mg/dL — AB (ref 70–99)
GLUCOSE-CAPILLARY: 113 mg/dL — AB (ref 70–99)

## 2018-01-15 LAB — BPAM RBC
BLOOD PRODUCT EXPIRATION DATE: 201911102359
ISSUE DATE / TIME: 201910141201
Unit Type and Rh: 9500

## 2018-01-15 LAB — TYPE AND SCREEN
ABO/RH(D): O NEG
Antibody Screen: NEGATIVE
UNIT DIVISION: 0

## 2018-01-15 LAB — PROTIME-INR
INR: 1.45
Prothrombin Time: 17.5 seconds — ABNORMAL HIGH (ref 11.4–15.2)

## 2018-01-15 MED ORDER — COLCHICINE 0.6 MG PO TABS
0.3000 mg | ORAL_TABLET | Freq: Every day | ORAL | Status: DC
  Administered 2018-01-15 – 2018-01-18 (×4): 0.3 mg via ORAL
  Filled 2018-01-15 (×4): qty 0.5

## 2018-01-15 MED ORDER — WARFARIN SODIUM 7.5 MG PO TABS
7.5000 mg | ORAL_TABLET | Freq: Once | ORAL | Status: AC
  Administered 2018-01-15: 7.5 mg via ORAL
  Filled 2018-01-15: qty 1

## 2018-01-15 MED ORDER — HYDROCERIN EX CREA: TOPICAL_CREAM | Freq: Two times a day (BID) | CUTANEOUS | Status: DC

## 2018-01-15 NOTE — Progress Notes (Signed)
[facility] Progress Note  Patient: [name] AOZ:[id number]   PCP: Cassandria Anger, MD DOB: 25-Aug-1935   DOA: 01/09/2018   DOS: 01/15/2018   Date of Service: the patient was seen and examined on 01/15/2018  Brief hospital course: Pt. with PMH of chronic A. fib on Coumadin, PVD S/P left CEA, chronic diastolic CHF, type II DM, CAD S/P CABG, HTN, OSA, mechanical AVR, B12 deficiency; admitted on 01/09/2018, presented with complaint of shortness of breath, weakness and left leg swelling, was found to have chronic anemia for hemoglobin of 5.6 and supratherapeutic INR. Currently further plan is close monitoring of INR and H&H.  Subjective: Patient denies any acute complaint.  No nausea no vomiting.  No fever no chills no chest pain.  Pain in the leg is getting better.  Activity level is also improving.  Later in the afternoon he does report of the patient had a severe erythema of his back.  Assessment and Plan: 1.  Acute blood loss anemia. Symptomatic. Left thigh hematoma. Supra therapeutic INR. Presented with hemoglobin of 5.6.   S/P 6PRBC transfusion and 3 FFP transfusion. Hemoglobin stable after PRBC now. Orthopedic surgery was consulted recommend nonoperative management with compression stockings and follow-up in 3 weeks. Patient mentions his pain and mobility is improving. No evidence of compartment syndrome for now. PT consulted.  2.  Chronic A. fib. Mechanical aortic valve Supra therapeutic INR. S/P 3 units of FFP and 5 PRBC. Currently rate controlled. Monitor on telemetry. Patient does have a mechanical aortic valve for which she will require anti-coagulation. Today's day 6 of subtherapeutic INR. Warfarin resumed without any bridging. If on 01/17/2018 the patient INR is not therapeutic and he will require bridging therapy. Patient will require close monitoring for at least 24 hours after his INR is therapeutic to ensure his H&H remained stable. Continue  amiodarone.  3.  Chronic diastolic CHF. Severe MR CAD S/P CABG Echocardiogram July 2019 shows 50 to 55% EF.  Severe MR, Seen by CHF team on the day of the discharge. Continue home medication for now.  Including torsemide, Aldactone Not a mitral clip candidate. Outpatient follow-up with cardio thoracic surgery. Continue Vytorin.  4.  OSA. Continue nightly BiPAP.  5.  Gout. Continue allopurinol.  6. Acute kidney injury on chronic kidney disease stage III. Baseline renal function between 1.3 and 1.4 Presented with mild worsening of 1.85.  Now 1.12.  6.  Hypokalemia. Replacing.  We will recheck tomorrow.  7.  COPD. Chronic hypoxic respiratory failure. Patient is on home oxygen.  Monitor.  Continue inhalers.  8.  Erythema of the back. Likely moisture induced. Monitor.  Frequent turning.  Pressure Injury 01/10/18 Stage II -  Partial thickness loss of dermis presenting as a shallow open ulcer with a red, pink wound bed without slough. 1 cm x .05 cm (Active)  01/10/18 0054   Location: Coccyx  Location Orientation:   Staging: Stage II -  Partial thickness loss of dermis presenting as a shallow open ulcer with a red, pink wound bed without slough.  Wound Description (Comments): 1 cm x .05 cm  Present on Admission: Yes  Dressing Type Foam 01/15/2018  9:00 AM  Dressing Clean;Dry;Intact 01/15/2018  9:00 AM  Dressing Change Frequency PRN 01/15/2018  9:00 AM   Diet: Cardiac diet DVT Prophylaxis: mechanical compression device on therapeutic anticoagulation.  Advance goals of care discussion: full code  Family Communication: no family was present at bedside, at the time of interview.   Disposition:  Discharge to be determined.  Consultants: orthopedics  Procedures: PRBC  Scheduled Meds: . allopurinol  100 mg Oral Daily  . amiodarone  200 mg Oral Daily  . colchicine  0.3 mg Oral Daily  . ezetimibe-simvastatin  1 tablet Oral QHS  . insulin aspart  0-9 Units Subcutaneous TID  WC  . polyethylene glycol  17 g Oral Daily  . potassium chloride  40 mEq Oral Daily  . potassium chloride  40 mEq Oral Weekly  . senna-docusate  1 tablet Oral BID  . sodium chloride flush  3 mL Intravenous Q12H  . torsemide  100 mg Oral BID  . Warfarin - Pharmacist Dosing Inpatient   Does not apply q1800   Continuous Infusions: . sodium chloride     PRN Meds: sodium chloride, acetaminophen, magnesium citrate, ondansetron **OR** ondansetron (ZOFRAN) IV, sodium chloride, sodium chloride flush Antibiotics: Anti-infectives (From admission, onward)   None       Objective: Physical Exam: Vitals:   01/15/18 0042 01/15/18 0825 01/15/18 0827 01/15/18 1652  BP: 99/69 (!) 73/56 (!) 95/50 103/67  Pulse: 76 83 82 82  Resp: 18 (!) 22  20  Temp: 98.4 F (36.9 C) 97.7 F (36.5 C)  97.7 F (36.5 C)  TempSrc: Oral Oral  Oral  SpO2: 100%  97% 100%  Weight:        Intake/Output Summary (Last 24 hours) at 01/15/2018 1904 Last data filed at 01/15/2018 1700 Gross per 24 hour  Intake 720 ml  Output 2900 ml  Net -2180 ml   Filed Weights   01/12/18 2300  Weight: 99 kg   General: Alert, Awake and Oriented to Time, Place and Person. Appear in mild distress, affect appropriate Eyes: PERRL, Conjunctiva normal ENT: Oral Mucosa clear moist. Neck: no JVD, no Abnormal Mass Or lumps Cardiovascular: S1 and S2 Present, no Murmur, Peripheral Pulses Present Respiratory: normal respiratory effort, Bilateral Air entry equal and Decreased, no use of accessory muscle, Clear to Auscultation, no Crackles, no wheezes Abdomen: Bowel Sound present, Soft and no tenderness, no hernia Skin: Upper back redness, no Rash, no induration Extremities: Left Pedal edema, Left calf tenderness Neurologic: Grossly no focal neuro deficit. Bilaterally Equal motor strength  Data Reviewed: CBC: Recent Labs  Lab 01/09/18 1435  01/12/18 0245 01/13/18 0239 01/14/18 0233 01/14/18 1801 01/15/18 0413  WBC 12.8*   < > 6.5  5.5 4.8 4.9 4.4  NEUTROABS 11.4*  --   --   --   --   --   --   HGB 5.6*   < > 8.2* 7.5* 7.6* 8.6* 8.4*  HCT 17.7*   < > 25.0* 24.4* 24.1* 27.6* 26.1*  MCV 100.0   < > 90.6 93.5 91.6 92.0 91.9  PLT 248   < > 149* 168 165 174 173   < > = values in this interval not displayed.   Basic Metabolic Panel: Recent Labs  Lab 01/09/18 1435 01/11/18 0756 01/12/18 0245 01/13/18 0239 01/14/18 0233  NA 130* 131* 129* 130* 131*  K 5.3* 4.2 4.2 3.6 3.1*  CL 94* 97* 96* 96* 95*  CO2 23 25 24 25 27   GLUCOSE 127* 110* 112* 102* 115*  BUN 46* 39* 37* 32* 30*  CREATININE 1.85* 1.48* 1.40* 1.29* 1.12  CALCIUM 8.7* 8.5* 8.2* 8.0* 7.9*  MG  --   --   --  2.0  --     Liver Function Tests: Recent Labs  Lab 01/12/18 0245  AST 32  ALT 15  ALKPHOS 116  BILITOT 2.0*  PROT 5.6*  ALBUMIN 2.0*   No results for input(s): LIPASE, AMYLASE in the last 168 hours. No results for input(s): AMMONIA in the last 168 hours. Coagulation Profile: Recent Labs  Lab 01/11/18 0756 01/12/18 0245 01/13/18 0239 01/14/18 0233 01/15/18 0413  INR 1.38 1.38 1.29 1.31 1.45   Cardiac Enzymes: No results for input(s): CKTOTAL, CKMB, CKMBINDEX, TROPONINI in the last 168 hours. BNP (last 3 results) Recent Labs    03/30/17 1147 05/25/17 1044 07/10/17 1523  PROBNP 1,858* 2,749* 411.0*   CBG: Recent Labs  Lab 01/14/18 1736 01/14/18 2138 01/15/18 0748 01/15/18 1202 01/15/18 1558  GLUCAP 111* 116* 135* 186* 124*   Studies: No results found.   Time spent: 35 minutes  Author: Berle Mull, MD [facility] Pager: [phone] 01/15/2018 7:04 PM  If 7PM-7AM, please contact night-coverage at [email or web address], password [facility]

## 2018-01-15 NOTE — Progress Notes (Signed)
Inpatient Rehabilitation  Per PT request, patient was screened by Gunnar Fusi for appropriateness for an Inpatient Acute Rehab consult.  At this time we are recommending an Inpatient Rehab consult.  Text paged MD to notify.  Please order if you are agreeable.    Carmelia Roller., CCC/SLP Admission Coordinator  [place]  Cell [phone]

## 2018-01-15 NOTE — Consult Note (Signed)
Physical Medicine and Rehabilitation Consult   Reason for Consult: Debility Referring Physician:  Dr. Marlowe Sax   HPI: [name] is a 82 y.o. male  CAD s/p CABG/AVR/CAF-on coumadin, T2DM, HTN, CKD, OSA, CHF--oxygen dependent, recent admissions for acute on chronic CHF as well as ABLA after tooth extraction. He was noted to have supratherapeutic INR-10  on recent check being treated with Vitamin K.  He was readmitted on 01/09/2018 with hematuria and  left lower extremity edema and pain due to intra-muscular hematoma in left vastus lateralis 22 x 10.8 x 6.6 cm.. INR 3.8 at admission and hemoglobin was 5.6.  He was treated with 3 units FFP and transfused with 5 units PRBC. Dr. Kermit Balo consulted for input and recommended compressive wraps as no vascular compromise noted and hematoma will likely require weeks to resolve.  Therapy evaluations done this a.m. revealing limitations due to pain and edema.  CIR recommended for follow-up therapy   Review of Systems  HENT: Negative for hearing loss and tinnitus.   Eyes: Negative for blurred vision and double vision.  Respiratory: Negative for cough, sputum production and stridor.   Cardiovascular: Positive for leg swelling. Negative for chest pain.  Gastrointestinal: Negative for heartburn and nausea.  Genitourinary: Negative for dysuria and urgency.  Musculoskeletal: Positive for myalgias. Negative for back pain and joint pain.  Skin: Negative for itching and rash.  Neurological: Positive for focal weakness. Negative for dizziness, sensory change and headaches.  Psychiatric/Behavioral: The patient does not have insomnia.      Past Medical History:  Diagnosis Date  . Carotid artery disease ([place]) 1994   s/p left carotid endarerectomy   . Chronic atrial fibrillation    a. on coumadin   . Chronic diastolic CHF (congestive heart failure) ([place])   . Diabetes mellitus without complication ([place])    dx 2016  . Dyspnea   . Heart murmur   . Hx  of CABG    a. 1994  . Hypertension   . OSA (obstructive sleep apnea)   . Rheumatic fever   . S/P AVR (aortic valve replacement)    a. mechanical valve 1996  . Subclavian bypass stenosis ([place])   . 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 Vitamin B12 deficiency     Past Surgical History:  Procedure Laterality Date  . Rankin   replaced due to aortic stenosis, [employer] mechanical prostesis  . CAROTID ENDARTERECTOMY Left 1997   subclavian bypass Done in [place]  . CORONARY ARTERY BYPASS GRAFT  1994   w SVG to RCA and SVG to circumflex  . heart bypass     Done in [place]  . MULTIPLE EXTRACTIONS WITH ALVEOLOPLASTY Bilateral 12/12/2017   Procedure: Extraction of tooth #'s 1, 12,13,14,15,17,18,19, 29, and 30 with alveoloplasty and gross debridement of remaining teeth`;  Surgeon: Lenn Cal, DDS;  Location: [facility];  Service: Oral Surgery;  Laterality: Bilateral;  MULTIPLE EXTRACTION WITH ALVEOLOPLASTY WITH PRE PROSTHETIC SURGERY AND GROSS DEBRIDEMENT OF REMAINING TEETH  . TEE WITHOUT CARDIOVERSION N/A 10/23/2017   Procedure: TRANSESOPHAGEAL ECHOCARDIOGRAM (TEE);  Surgeon: Larey Dresser, MD;  Location: [facility] ENDOSCOPY;  Service: Cardiovascular;  Laterality: N/A;  . TONSILLECTOMY      Family History  Problem Relation Age of Onset  . Cancer Father        lung   . Hypertension Mother     Social History:  Married. Has been limited due to CHF since 10/2017.  Has been using a rollator since then. He  reports that he quit smoking about 25 years ago. His smoking use included cigarettes. He has a 30.00 pack-year smoking history. He has never used smokeless tobacco. He reports that he drinks a glass of wine 3-4 times a week.  He reports that he does not use drugs.    Allergies  Allergen Reactions  . Rifampin Rash    May have been caused by Vancomycin or Rifampin (??)  . Vancomycin Rash    May have been caused  by Vancomycin or Rifampin (??)    Medications Prior to Admission  Medication Sig Dispense Refill  . allopurinol (ZYLOPRIM) 100 MG tablet Take 1 tablet (100 mg total) by mouth daily. 30 tablet 3  . amiodarone (PACERONE) 200 MG tablet Take 1 tablet (200 mg total) by mouth daily. 60 tablet 3  . cefUROXime (CEFTIN) 250 MG tablet Take 1 tablet (250 mg total) by mouth 2 (two) times daily for 14 days. 14 tablet 0  . ezetimibe-simvastatin (VYTORIN) 10-20 MG tablet Take 1 tablet by mouth at bedtime. 90 tablet 3  . [EXPIRED] phytonadione (MEPHYTON) 5 MG tablet Take 0.5 tablets (2.5 mg total) by mouth once for 1 dose. 1 tablet 0  . Potassium Chloride ER 20 MEQ TBCR Take 40 mEq by mouth daily. PLEASE TAKE EXTRA DOSE EVERY MONDAY 60 tablet 0  . torsemide (DEMADEX) 100 MG tablet Take 1 tablet (100 mg total) by mouth 2 (two) times daily. 60 tablet 3  . enoxaparin (LOVENOX) 100 MG/ML injection Inject 1 mL (100 mg total) into the skin every 12 (twelve) hours. 10 Syringe 1  . metolazone (ZAROXOLYN) 2.5 MG tablet Take 1 tablet (2.5 mg total) by mouth every Monday. 10 tablet 0  . warfarin (COUMADIN) 5 MG tablet Take 1 tablet (5 mg total) by mouth daily at 6 PM. 20 tablet 0    Home: Home Living Family/patient expects to be discharged to:: Private residence Living Arrangements: Spouse/significant other Available Help at Discharge: Family, Other (Comment)(Pt reports having HH PT and nursing ) Type of Home: House Home Access: Stairs to enter [employer] of Steps: 1/2 a step Entrance Stairs-Rails: None Home Layout: Two level(Pt mainly uses 1st floor. Has a chair lift if he needs to go up stairs.) Alternate Level Stairs-Rails: None Bathroom Shower/Tub: Multimedia programmer: Standard Home Equipment: Other (comment), Cane - single point(rollator ) Additional Comments: Pt reports using rollator prior to entering the hospital  Functional History: Prior Function Level of Independence:  Independent with assistive device(s) Gait / Transfers Assistance Needed: Pt amb with rollator and has HH PT and nursing.  Comments: Pt reports he got injured stepping into the shower. Functional Status:  Mobility: Bed Mobility Overal bed mobility: Needs Assistance Bed Mobility: Supine to Sit Supine to sit: Mod assist, +2 for physical assistance General bed mobility comments: Pt required 2+ mod assist to support L LE and lift trunk to move from supine to EOB. Transfers Overall transfer level: Needs assistance Equipment used: Rolling walker (2 wheeled) Transfers: Sit to/from Stand Sit to Stand: Mod assist, +2 physical assistance General transfer comment: Pt required 2+ mod assist for lifting assist from EOB Ambulation/Gait Ambulation/Gait assistance: Min assist Gait Distance (Feet): 5 Feet Assistive device: Rolling walker (2 wheeled) Gait Pattern/deviations: Step-to pattern, Decreased stride length, Decreased step length - right, Decreased step length - left, Decreased dorsiflexion - left, Decreased dorsiflexion - right, Trunk flexed  General Gait Details: Pt required min assist for amb for sequencing of gait.     ADL:    Cognition: Cognition Overall Cognitive Status: Within Functional Limits for tasks assessed Orientation Level: Oriented X4 Cognition Arousal/Alertness: Awake/alert Behavior During Therapy: WFL for tasks assessed/performed Overall Cognitive Status: Within Functional Limits for tasks assessed  Blood pressure (!) 95/50, pulse 82, temperature 97.7 F (36.5 C), temperature source Oral, resp. rate (!) 22, weight 99 kg, SpO2 97 %. Physical Exam  Nursing note and vitals reviewed. Constitutional: He is oriented to person, place, and time. He appears well-developed. Nasal cannula in place.  HENT:  Head: Normocephalic.  Oxygen via [place]  Eyes: Pupils are equal, round, and reactive to light.  Neck: No thyromegaly present.  Cardiovascular: Normal rate. An irregularly  irregular rhythm present.  + click  Respiratory: Effort normal.  GI: He exhibits no distension. There is no tenderness.  Musculoskeletal:  Left thigh with 3+ edema and compressive ace wrap from thigh to foot.. Burn left upper thigh with opsite. RLE with rubor and stasis changes.   Neurological: He is alert and oriented to person, place, and time. No cranial nerve deficit.  UE motor 4/5 prox to distal. RLE 3/5 HF, KE and 4/5 ADF/PF. LLE: limited in HF, KE due to pain, 3/5 ADF/PF. No focal sensory deficits  Psychiatric: He has a normal mood and affect. His behavior is normal.    Results for orders placed or performed during the hospital encounter of 01/09/18 (from the past 24 hour(s))  Glucose, capillary     Status: Abnormal   Collection Time: 01/14/18  5:36 PM  Result Value Ref Range   Glucose-Capillary 111 (H) 70 - 99 mg/dL  CBC     Status: Abnormal   Collection Time: 01/14/18  6:01 PM  Result Value Ref Range   WBC 4.9 4.0 - 10.5 K/uL   RBC 3.00 (L) 4.22 - 5.81 MIL/uL   Hemoglobin 8.6 (L) 13.0 - 17.0 g/dL   HCT 27.6 (L) 39.0 - 52.0 %   MCV 92.0 80.0 - 100.0 fL   MCH 28.7 26.0 - 34.0 pg   MCHC 31.2 30.0 - 36.0 g/dL   RDW 19.9 (H) 11.5 - 15.5 %   Platelets 174 150 - 400 K/uL   nRBC 0.0 0.0 - 0.2 %  Glucose, capillary     Status: Abnormal   Collection Time: 01/14/18  9:38 PM  Result Value Ref Range   Glucose-Capillary 116 (H) 70 - 99 mg/dL  CBC     Status: Abnormal   Collection Time: 01/15/18  4:13 AM  Result Value Ref Range   WBC 4.4 4.0 - 10.5 K/uL   RBC 2.84 (L) 4.22 - 5.81 MIL/uL   Hemoglobin 8.4 (L) 13.0 - 17.0 g/dL   HCT 26.1 (L) 39.0 - 52.0 %   MCV 91.9 80.0 - 100.0 fL   MCH 29.6 26.0 - 34.0 pg   MCHC 32.2 30.0 - 36.0 g/dL   RDW 19.9 (H) 11.5 - 15.5 %   Platelets 173 150 - 400 K/uL   nRBC 0.0 0.0 - 0.2 %  Protime-INR     Status: Abnormal   Collection Time: 01/15/18  4:13 AM  Result Value Ref Range   Prothrombin Time 17.5 (H) 11.4 - 15.2 seconds   INR 1.45     Glucose, capillary     Status: Abnormal   Collection Time: 01/15/18  7:48 AM  Result Value Ref Range   Glucose-Capillary 135 (H) 70 -  99 mg/dL  Glucose, capillary     Status: Abnormal   Collection Time: 01/15/18 12:02 PM  Result Value Ref Range   Glucose-Capillary 186 (H) 70 - 99 mg/dL   No results found.   Assessment/Plan: Diagnosis: left intramuscular hematoma, debility related to multiple medical conditions 1. Does the need for close, 24 hr/day medical supervision in concert with the patient's rehab needs make it unreasonable for this patient to be served in a less intensive setting? Yes 2. Co-Morbidities requiring supervision/potential complications: CAD, DM, HTN, CKD, pain mgt, wound care 3. Due to bladder management, bowel management, safety, skin/wound care, disease management, medication administration, pain management and patient education, does the patient require 24 hr/day rehab nursing? Yes 4. Does the patient require coordinated care of a physician, rehab nurse, PT (1-2 hrs/day, 5 days/week) and OT (1-2 hrs/day, 5 days/week) to address physical and functional deficits in the context of the above medical diagnosis(es)? Yes Addressing deficits in the following areas: balance, endurance, locomotion, strength, transferring, bowel/bladder control, bathing, dressing, feeding, grooming, toileting and psychosocial support 5. Can the patient actively participate in an intensive therapy program of at least 3 hrs of therapy per day at least 5 days per week? Yes 6. The potential for patient to make measurable gains while on inpatient rehab is good 7. Anticipated functional outcomes upon discharge from inpatient rehab are modified independent  with PT, modified independent with OT, n/a with SLP. 8. Estimated rehab length of stay to reach the above functional goals is: 10-14 days 9. Anticipated D/C setting: Home 10. Anticipated post D/C treatments: [place] therapy 11. Overall Rehab/Functional  Prognosis: excellent  RECOMMENDATIONS: This patient's condition is appropriate for continued rehabilitative care in the following setting: CIR Patient has agreed to participate in recommended program. Yes Note that insurance prior authorization may be required for reimbursement for recommended care.  Comment: Patient's wife cannot provide physical assistance. Pt has potential to be mod I, supervision. Rehab Admissions Coordinator to follow up.  Thanks,  Meredith Staggers, MD, Mellody Drown  I have personally performed a face to face diagnostic evaluation of this patient. Additionally, I have reviewed and concur with the physician assistant's documentation above.    Andrew Leriche, PA-C 01/15/2018

## 2018-01-15 NOTE — Progress Notes (Signed)
ANTICOAGULATION CONSULT NOTE - follow-up Consult  Pharmacy Consult for WARFARIN Indication: atrial fibrillation and mechanical aortic valve  Allergies  Allergen Reactions  . Rifampin Rash    May have been caused by Vancomycin or Rifampin (??)  . Vancomycin Rash    May have been caused by Vancomycin or Rifampin (??)    Patient Measurements: Height: 6\' 2"  (188 cm) Weight: 94.2 kg IBW/kg (Calculated) : 82.2   Vital Signs: Temp: 97.7 F (36.5 C) (10/15 0825) Temp Source: Oral (10/15 0825) BP: 95/50 (10/15 0827) Pulse Rate: 82 (10/15 0827)  Labs: Recent Labs    01/13/18 0239 01/14/18 0233 01/14/18 1801 01/15/18 0413  HGB 7.5* 7.6* 8.6* 8.4*  HCT 24.4* 24.1* 27.6* 26.1*  PLT 168 165 174 173  LABPROT 16.0* 16.1*  --  17.5*  INR 1.29 1.31  --  1.45  CREATININE 1.29* 1.12  --   --     Estimated Creatinine Clearance: 66.1 mL/min (by C-G formula based on SCr of 1.12 mg/dL).   Medical History: Past Medical History:  Diagnosis Date  . Carotid artery disease ([place]) 1994   s/p left carotid endarerectomy   . Chronic atrial fibrillation    a. on coumadin   . Chronic diastolic CHF (congestive heart failure) ([place])   . Diabetes mellitus without complication ([place])    dx 2016  . Dyspnea   . Heart murmur   . Hx of CABG    a. 1994  . Hypertension   . OSA (obstructive sleep apnea)   . Rheumatic fever   . S/P AVR (aortic valve replacement)    a. mechanical valve 1996  . Subclavian bypass stenosis ([place])   . 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 Vitamin B12 deficiency     Medications:  Scheduled:  . allopurinol  100 mg Oral Daily  . amiodarone  200 mg Oral Daily  . ezetimibe-simvastatin  1 tablet Oral QHS  . insulin aspart  0-9 Units Subcutaneous TID WC  . polyethylene glycol  17 g Oral Daily  . potassium chloride  40 mEq Oral Daily  . potassium chloride  40 mEq Oral Weekly  . senna-docusate  1 tablet Oral BID   . sodium chloride flush  3 mL Intravenous Q12H  . torsemide  100 mg Oral BID  . Warfarin - Pharmacist Dosing Inpatient   Does not apply q1800    Assessment: Patient is an 78yoM PMH mechanical aortic valve replacement, permanent afib, on warfarin chronically, chronic combined CHF, CKDstage3, DM, CAD with CABG, COPD, severe mitral regurgitation, carotid stenosis with left carotid enterotomy, HTN and HLD.  Patient's PTA warfarin dose was 7.5mg  MWF, 5mg  all other days.   INR was noted to be elevated to 10 and hemoglobin down to 7 two days prior at his PCP's office. Patient given vit K tablet and ultrasound for DVT/hematoma which directed him to the ED.  In the ED, hgb was down to 5.6, INR at 10. Patient admitted and transfused blood and FFP.  Orthopedic surgery suggested compression or TED host; no surgery.  Patient as of 10/12 is s/p 5 units of pRBC and 3 units FFP.  Pharmacy was consulted to reinitiate warfarin without bridge on 10/12, confirmed with primary team physician.   INR has trended up to 1.45. We will repeat the 7.5mg  dose.   Drug-drug interactions:  Warfarin + amiodarone  Warfarim + allopurinol Interactions noted and will monitor for additive effects of  anticoagulation through daily CBC/INR and clinical monitoring.   Goal of Therapy:  INR 2.5 - 3.5 Monitor platelets by anticoagulation protocol: Yes   Plan:  Warfarin 7.5mg  PO x1 tonight Monitor daily INR and CBC, and signs / symptoms of bleeding  Onnie Boer, PharmD, Jake Samples, AAHIVP, CPP Infectious Disease Pharmacist Pager: [phone] 01/15/2018 9:17 AM

## 2018-01-15 NOTE — Evaluation (Signed)
Physical Therapy Evaluation Patient Details Name: [name] MRN: [id number] DOB: January 19, 1936 Today's Date: 01/15/2018   History of Present Illness  82 y.o. M w/ a hx of mechanical aortic valve (1994), OSA on CPAP, permanent A. fib, chronic anticoagulant therapy with Coumadin, chronic combined CHF, CKD stage III, DM, CAD with CABG 1994, COPD, severe mitral regurgitation, carotid stenosis with left carotid enterotomy, HTN, and HLD who was admitted with anemia to 5.6 due to left leg hematoma  Clinical Impression  Pt presents with bilateral LE weakness and L LE pain due to L leg hematoma and gout. Pt required 2+ mod assist with transfers and amb with walker and was limited due to pain and weakness. Pt has spouse at home and reports that [facility] PT and nursing come into help him and his wife. Pt would benefit from skilled PT while in the acute care setting and would benefit from CIR upon d/c.    Follow Up Recommendations CIR    Equipment Recommendations  Other (comment)(Rollator)    Recommendations for Other Services       Precautions / Restrictions Precautions Precautions: Fall Restrictions Weight Bearing Restrictions: No      Mobility  Bed Mobility Overal bed mobility: Needs Assistance Bed Mobility: Supine to Sit     Supine to sit: Mod assist;+2 for physical assistance     General bed mobility comments: Pt required 2+ mod assist to support L LE and lift trunk to move from supine to EOB.  Transfers Overall transfer level: Needs assistance Equipment used: Rolling walker (2 wheeled) Transfers: Sit to/from Stand Sit to Stand: Mod assist;+2 physical assistance         General transfer comment: Pt required 2+ mod assist for lifting assist from EOB  Ambulation/Gait Ambulation/Gait assistance: Min assist Gait Distance (Feet): 5 Feet Assistive device: Rolling walker (2 wheeled) Gait Pattern/deviations: Step-to pattern;Decreased stride length;Decreased step length -  right;Decreased step length - left;Decreased dorsiflexion - left;Decreased dorsiflexion - right;Trunk flexed     General Gait Details: Pt required min assist for amb for sequencing of gait.   Stairs            Wheelchair Mobility    Modified Rankin (Stroke Patients Only)       Balance Overall balance assessment: Needs assistance Sitting-balance support: Bilateral upper extremity supported Sitting balance-Leahy Scale: Fair Sitting balance - Comments: Pt required UE to maintain sitting posture EOB. Pt required assist initally for supine to sitting balance and then was able to sit with support from his UE.    Standing balance support: Bilateral upper extremity supported Standing balance-Leahy Scale: Poor                               Pertinent Vitals/Pain Pain Assessment: 0-10 Pain Score: 4  Pain Location: Pt reports no pain when lying in the bed. Pt reports 4/10 pain when sitting up, standing and walking. Pt reports pain in his L leg and foot, which he attributed to his gout.  Pain Intervention(s): Monitored during session    Home Living Family/patient expects to be discharged to:: Private residence Living Arrangements: Spouse/significant other Available Help at Discharge: Family;Other (Comment)(Pt reports having HH PT and nursing ) Type of Home: House Home Access: Stairs to enter Entrance Stairs-Rails: None Entrance Stairs-Number of Steps: 1/2 a step Home Layout: Two level(Pt mainly uses 1st floor. Has a chair lift if he needs to go up stairs.) Home Equipment: Other (comment);Cane -  single point(rollator ) Additional Comments: Pt reports using rollator prior to entering the hospital    Prior Function Level of Independence: Independent with assistive device(s)   Gait / Transfers Assistance Needed: Pt amb with rollator and has HH PT and nursing.      Comments: Pt reports he got injured stepping into the shower.     Hand Dominance         Extremity/Trunk Assessment   Upper Extremity Assessment Upper Extremity Assessment: Overall WFL for tasks assessed;Generalized weakness    Lower Extremity Assessment Lower Extremity Assessment: Generalized weakness;RLE deficits/detail;LLE deficits/detail;Overall WFL for tasks assessed RLE Deficits / Details: R PF mmt 4/5 LLE Deficits / Details: L PF mmt 3/5.       Communication   Communication: No difficulties  Cognition Arousal/Alertness: Awake/alert Behavior During Therapy: WFL for tasks assessed/performed Overall Cognitive Status: Within Functional Limits for tasks assessed                                        General Comments General comments (skin integrity, edema, etc.): Pt had a rash on his back. Pt R LE had dark purple brusing. Pt reports living at home and had wife, [facility] PT and home nursing to help take care of him.    Exercises     Assessment/Plan    PT Assessment Patient needs continued PT services  PT Problem List Decreased strength;Decreased mobility;Decreased activity tolerance;Decreased skin integrity;Decreased balance;Pain       PT Treatment Interventions DME instruction;Therapeutic activities;Gait training;Therapeutic exercise;Functional mobility training;Stair training    PT Goals (Current goals can be found in the Care Plan section)  Acute Rehab PT Goals Patient Stated Goal: Pt would like to go home and get stronger. PT Goal Formulation: With patient Time For Goal Achievement: 01/29/18 Potential to Achieve Goals: Good    Frequency Min 3X/week   Barriers to discharge        Co-evaluation               AM-PAC PT "6 Clicks" Daily Activity  Outcome Measure Difficulty turning over in bed (including adjusting bedclothes, sheets and blankets)?: A Lot Difficulty moving from lying on back to sitting on the side of the bed? : Unable Difficulty sitting down on and standing up from a chair with arms (e.g., wheelchair, bedside  commode, etc,.)?: Unable Help needed moving to and from a bed to chair (including a wheelchair)?: Total Help needed walking in hospital room?: Total Help needed climbing 3-5 steps with a railing? : Total 6 Click Score: 7    End of Session Equipment Utilized During Treatment: Gait belt Activity Tolerance: Patient tolerated treatment well Patient left: in chair;with call bell/phone within reach Nurse Communication: Mobility status(Communicated with the nurse regarding Pt requiring 2+ assist. ) PT Visit Diagnosis: Unsteadiness on feet (R26.81);Muscle weakness (generalized) (M62.81);Difficulty in walking, not elsewhere classified (R26.2);Pain Pain - Right/Left: Left Pain - part of body: Leg;Ankle and joints of foot    Time: 0940-1020 PT Time Calculation (min) (ACUTE ONLY): 40 min   Charges:   PT Evaluation $PT Eval High Complexity: 1 High PT Treatments $Gait Training: 8-22 mins $Therapeutic Activity: 8-22 mins        [employer] SPT 01/15/2018   Rolland Porter 01/15/2018, 11:41 AM

## 2018-01-15 NOTE — Progress Notes (Signed)
Refused CPAP

## 2018-01-16 DIAGNOSIS — D649 Anemia, unspecified: Secondary | ICD-10-CM

## 2018-01-16 DIAGNOSIS — R791 Abnormal coagulation profile: Secondary | ICD-10-CM

## 2018-01-16 LAB — CBC
HCT: 26.9 % — ABNORMAL LOW (ref 39.0–52.0)
HEMOGLOBIN: 8.5 g/dL — AB (ref 13.0–17.0)
MCH: 28.9 pg (ref 26.0–34.0)
MCHC: 31.6 g/dL (ref 30.0–36.0)
MCV: 91.5 fL (ref 80.0–100.0)
Platelets: 203 10*3/uL (ref 150–400)
RBC: 2.94 MIL/uL — AB (ref 4.22–5.81)
RDW: 20 % — ABNORMAL HIGH (ref 11.5–15.5)
WBC: 4.5 10*3/uL (ref 4.0–10.5)
nRBC: 0 % (ref 0.0–0.2)

## 2018-01-16 LAB — PROTIME-INR
INR: 1.62
PROTHROMBIN TIME: 19 s — AB (ref 11.4–15.2)

## 2018-01-16 LAB — GLUCOSE, CAPILLARY
GLUCOSE-CAPILLARY: 117 mg/dL — AB (ref 70–99)
Glucose-Capillary: 105 mg/dL — ABNORMAL HIGH (ref 70–99)
Glucose-Capillary: 104 mg/dL — ABNORMAL HIGH (ref 70–99)
Glucose-Capillary: 108 mg/dL — ABNORMAL HIGH (ref 70–99)

## 2018-01-16 LAB — MAGNESIUM: Magnesium: 1.8 mg/dL (ref 1.7–2.4)

## 2018-01-16 LAB — BASIC METABOLIC PANEL
Anion gap: 10 (ref 5–15)
BUN: 29 mg/dL — AB (ref 8–23)
CO2: 30 mmol/L (ref 22–32)
CREATININE: 1.07 mg/dL (ref 0.61–1.24)
Calcium: 8.2 mg/dL — ABNORMAL LOW (ref 8.9–10.3)
Chloride: 93 mmol/L — ABNORMAL LOW (ref 98–111)
GFR calc Af Amer: >60 mL/min (ref >60)
GLUCOSE: 105 mg/dL — AB (ref 70–99)
Potassium: 2.7 mmol/L — CL (ref 3.5–5.1)
Sodium: 133 mmol/L — ABNORMAL LOW (ref 135–145)

## 2018-01-16 MED ORDER — POTASSIUM CHLORIDE CRYS ER 20 MEQ PO TBCR
40.0000 meq | EXTENDED_RELEASE_TABLET | ORAL | Status: DC
  Administered 2018-01-16: 40 meq via ORAL
  Filled 2018-01-16 (×2): qty 2

## 2018-01-16 MED ORDER — POTASSIUM CHLORIDE CRYS ER 20 MEQ PO TBCR
40.0000 meq | EXTENDED_RELEASE_TABLET | ORAL | Status: AC
  Administered 2018-01-16 (×2): 40 meq via ORAL
  Filled 2018-01-16 (×2): qty 2

## 2018-01-16 MED ORDER — POTASSIUM CHLORIDE CRYS ER 20 MEQ PO TBCR
40.0000 meq | EXTENDED_RELEASE_TABLET | Freq: Two times a day (BID) | ORAL | Status: DC
  Administered 2018-01-16 – 2018-01-18 (×4): 40 meq via ORAL
  Filled 2018-01-16 (×3): qty 2

## 2018-01-16 MED ORDER — WARFARIN SODIUM 10 MG PO TABS
10.0000 mg | ORAL_TABLET | Freq: Once | ORAL | Status: AC
  Administered 2018-01-16: 10 mg via ORAL
  Filled 2018-01-16: qty 1

## 2018-01-16 NOTE — Progress Notes (Signed)
Inpatient Rehabilitation  Met with patient at bedside to discuss team's recommendation for IP Rehab.  Shared booklets, insurance verification letter, and answered questions.  Patient acknowledged need for therapy prior to returning home and wants to maximize his functional independence.  Plan to follow for timing of medical readiness and IP Rehab bed availability.  Aware of MD note anticipating readiness on Friday.  Call if questions.    Carmelia Roller., CCC/SLP Admission Coordinator  [place]  Cell [phone]

## 2018-01-16 NOTE — Progress Notes (Addendum)
[facility] Progress Note  Patient: [name] LEX:[id number]   PCP: Cassandria Anger, MD DOB: March 13, 1936   DOA: 01/09/2018   DOS: 01/16/2018   Date of Service: the patient was seen and examined on 01/16/2018  Brief hospital course: Pt. with PMH of chronic A. fib on Coumadin, PVD S/P left CEA, chronic diastolic CHF, type II DM, CAD S/P CABG, HTN, OSA, mechanical AVR, B12 deficiency; admitted on 01/09/2018, presented with complaint of shortness of breath, weakness and left leg hematoma, was found to have  hemoglobin of 5.6 and supratherapeutic INR.   Subjective: Feels better today, denies any thigh/leg pain, mobility improving, denies any dyspnea  Assessment and Plan: 1.  Acute blood loss anemia/large Left thigh hematoma/Supra therapeutic INR. Presented with hemoglobin of 5.6, and an INR of 10, with spontaneous thigh hematoma S/P 6PRBC transfusion and 3 FFP transfusion. -Stable and improved now -Orthopedic surgery was consulted recommend nonoperative management with compression stockings and follow-up in 3 weeks. -Ambulating now -Restarted Coumadin, will bridge with Lovenox or heparin tomorrow -Discharge planning, CIR on Friday if stable  2.  Chronic A. Fib/Mechanical aortic valve/Supra therapeutic INR. -On admission patient received vitamin K, 3 units of FFP and 5 units of PRBC -Anticoagulation was stopped, INR drifted down, Coumadin resumed without bridging -INR is 1.6 today, plan to start bridging with Lovenox or heparin tomorrow -Continue amiodarone.  3.  Chronic diastolic CHF/Severe MR/CAD S/P CABG Echocardiogram July 2019 shows 50 to 55% EF.  Severe MR, -Continue home regimen of torsemide, Aldactone -Not a mitral clip candidate. Outpatient follow-up with cardio thoracic surgery. Continue Vytorin.  4.  OSA. Continue nightly BiPAP.  5.  Gout. Continue allopurinol.  6. Acute kidney injury on chronic kidney disease stage III. Baseline renal function between 1.3  and 1.4 Presented with mild worsening of 1.85, now  improving down to 1.2  6.  Hypokalemia. Replace  7.  COPD. Chronic hypoxic respiratory failure. Patient is on home oxygen -Stable, Continue inhalers.  8. MASD Monitor.  Frequent turning.  Pressure Injury 01/10/18 Stage II -  Partial thickness loss of dermis presenting as a shallow open ulcer with a red, pink wound bed without slough. 1 cm x .05 cm (Active)  01/10/18 0054   Location: Coccyx  Location Orientation:   Staging: Stage II -  Partial thickness loss of dermis presenting as a shallow open ulcer with a red, pink wound bed without slough.  Wound Description (Comments): 1 cm x .05 cm  Present on Admission: Yes  Dressing Type Foam 01/15/2018  9:00 AM  Dressing Clean;Dry;Intact 01/15/2018  9:00 AM  Dressing Change Frequency PRN 01/15/2018  9:00 AM   Diet: Cardiac diet DVT Prophylaxis: mechanical compression device on therapeutic anticoagulation.  Advance goals of care discussion: full code  Family Communication: wife was present at bedside  Disposition: CIR on Friday if stable  Consultants: orthopedics  Procedures: PRBC  Scheduled Meds: . allopurinol  100 mg Oral Daily  . amiodarone  200 mg Oral Daily  . colchicine  0.3 mg Oral Daily  . ezetimibe-simvastatin  1 tablet Oral QHS  . insulin aspart  0-9 Units Subcutaneous TID WC  . polyethylene glycol  17 g Oral Daily  . potassium chloride  40 mEq Oral Daily  . potassium chloride  40 mEq Oral Weekly  . potassium chloride  40 mEq Oral Q4H  . senna-docusate  1 tablet Oral BID  . sodium chloride flush  3 mL Intravenous Q12H  . torsemide  100 mg Oral  BID  . warfarin  10 mg Oral ONCE-1800  . Warfarin - Pharmacist Dosing Inpatient   Does not apply q1800   Continuous Infusions: . sodium chloride     PRN Meds: sodium chloride, acetaminophen, magnesium citrate, ondansetron **OR** ondansetron (ZOFRAN) IV, sodium chloride, sodium chloride  flush Antibiotics: Anti-infectives (From admission, onward)   None       Objective: Physical Exam: Vitals:   01/15/18 1652 01/15/18 2000 01/15/18 2325 01/16/18 0925  BP: 103/67  (!) 109/58 (!) 98/57  Pulse: 82  70 90  Resp: 20 18 20 18   Temp: 97.7 F (36.5 C)  97.9 F (36.6 C) (!) 97.4 F (36.3 C)  TempSrc: Oral  Oral Oral  SpO2: 100%  98% 98%  Weight:        Intake/Output Summary (Last 24 hours) at 01/16/2018 1312 Last data filed at 01/16/2018 0900 Gross per 24 hour  Intake 720 ml  Output 1725 ml  Net -1005 ml   Filed Weights   01/12/18 2300  Weight: 99 kg   Gen: Awake, Alert, Oriented X 3, no distress HEENT: PERRLA, Neck supple, no JVD Lungs: Good air movement, clear bilaterally CVS: S1-S2/regular rate rhythm Abd: soft, Non tender, non distended, BS present Extremities: Left upper thigh with mild swelling, superficial burn with dressing Skin: no new rashes  Data Reviewed: CBC: Recent Labs  Lab 01/09/18 1435  01/13/18 0239 01/14/18 0233 01/14/18 1801 01/15/18 0413 01/16/18 0310  WBC 12.8*   < > 5.5 4.8 4.9 4.4 4.5  NEUTROABS 11.4*  --   --   --   --   --   --   HGB 5.6*   < > 7.5* 7.6* 8.6* 8.4* 8.5*  HCT 17.7*   < > 24.4* 24.1* 27.6* 26.1* 26.9*  MCV 100.0   < > 93.5 91.6 92.0 91.9 91.5  PLT 248   < > 168 165 174 173 203   < > = values in this interval not displayed.   Basic Metabolic Panel: Recent Labs  Lab 01/11/18 0756 01/12/18 0245 01/13/18 0239 01/14/18 0233 01/16/18 0310  NA 131* 129* 130* 131* 133*  K 4.2 4.2 3.6 3.1* 2.7*  CL 97* 96* 96* 95* 93*  CO2 25 24 25 27 30   GLUCOSE 110* 112* 102* 115* 105*  BUN 39* 37* 32* 30* 29*  CREATININE 1.48* 1.40* 1.29* 1.12 1.07  CALCIUM 8.5* 8.2* 8.0* 7.9* 8.2*  MG  --   --  2.0  --  1.8    Liver Function Tests: Recent Labs  Lab 01/12/18 0245  AST 32  ALT 15  ALKPHOS 116  BILITOT 2.0*  PROT 5.6*  ALBUMIN 2.0*   No results for input(s): LIPASE, AMYLASE in the last 168 hours. No  results for input(s): AMMONIA in the last 168 hours. Coagulation Profile: Recent Labs  Lab 01/12/18 0245 01/13/18 0239 01/14/18 0233 01/15/18 0413 01/16/18 0310  INR 1.38 1.29 1.31 1.45 1.62   Cardiac Enzymes: No results for input(s): CKTOTAL, CKMB, CKMBINDEX, TROPONINI in the last 168 hours. BNP (last 3 results) Recent Labs    03/30/17 1147 05/25/17 1044 07/10/17 1523  PROBNP 1,858* 2,749* 411.0*   CBG: Recent Labs  Lab 01/15/18 1202 01/15/18 1558 01/15/18 2141 01/16/18 0758 01/16/18 1200  GLUCAP 186* 124* 113* 105* 104*   Studies: No results found.   Time spent: 35 minutes  Author: Domenic Polite, MD [facility] Page via [email or web address], password [email or web address] 01/16/2018 1:12 PM  If 7PM-7AM, please contact night-coverage at  [email or web address], password [facility]

## 2018-01-16 NOTE — Progress Notes (Signed)
ANTICOAGULATION CONSULT NOTE - follow-up Consult  Pharmacy Consult for WARFARIN Indication: atrial fibrillation and mechanical aortic valve  Allergies  Allergen Reactions  . Rifampin Rash    May have been caused by Vancomycin or Rifampin (??)  . Vancomycin Rash    May have been caused by Vancomycin or Rifampin (??)    Patient Measurements: Height: 6\' 2"  (188 cm) Weight: 94.2 kg IBW/kg (Calculated) : 82.2   Vital Signs: Temp: 97.9 F (36.6 C) (10/15 2325) Temp Source: Oral (10/15 2325) BP: 109/58 (10/15 2325) Pulse Rate: 70 (10/15 2325)  Labs: Recent Labs    01/14/18 0233 01/14/18 1801 01/15/18 0413 01/16/18 0310  HGB 7.6* 8.6* 8.4* 8.5*  HCT 24.1* 27.6* 26.1* 26.9*  PLT 165 174 173 203  LABPROT 16.1*  --  17.5* 19.0*  INR 1.31  --  1.45 1.62  CREATININE 1.12  --   --  1.07    Estimated Creatinine Clearance: 69.2 mL/min (by C-G formula based on SCr of 1.07 mg/dL).   Medical History: Past Medical History:  Diagnosis Date  . Carotid artery disease ([place]) 1994   s/p left carotid endarerectomy   . Chronic atrial fibrillation    a. on coumadin   . Chronic diastolic CHF (congestive heart failure) ([place])   . Diabetes mellitus without complication ([place])    dx 2016  . Dyspnea   . Heart murmur   . Hx of CABG    a. 1994  . Hypertension   . OSA (obstructive sleep apnea)   . Rheumatic fever   . S/P AVR (aortic valve replacement)    a. mechanical valve 1996  . Subclavian bypass stenosis ([place])   . 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 Vitamin B12 deficiency     Medications:  Scheduled:  . allopurinol  100 mg Oral Daily  . amiodarone  200 mg Oral Daily  . colchicine  0.3 mg Oral Daily  . ezetimibe-simvastatin  1 tablet Oral QHS  . insulin aspart  0-9 Units Subcutaneous TID WC  . polyethylene glycol  17 g Oral Daily  . potassium chloride  40 mEq Oral Daily  . potassium chloride  40 mEq Oral Weekly  .  potassium chloride  40 mEq Oral Q4H  . senna-docusate  1 tablet Oral BID  . sodium chloride flush  3 mL Intravenous Q12H  . torsemide  100 mg Oral BID  . Warfarin - Pharmacist Dosing Inpatient   Does not apply q1800    Assessment: Patient is an 20yoM PMH mechanical aortic valve replacement, permanent afib, on warfarin chronically, chronic combined CHF, CKDstage3, DM, CAD with CABG, COPD, severe mitral regurgitation, carotid stenosis with left carotid enterotomy, HTN and HLD.  Patient's PTA warfarin dose was 7.5mg  MWF, 5mg  all other days.   INR was noted to be elevated to 10 and hemoglobin down to 7 two days prior at his PCP's office. Patient given vit K tablet and ultrasound for DVT/hematoma which directed him to the ED.  In the ED, hgb was down to 5.6, INR at 10. Patient admitted and transfused blood and FFP.  Orthopedic surgery suggested compression or TED host; no surgery.  Patient as of 10/12 is s/p 5 units of pRBC and 3 units FFP.  Pharmacy was consulted to reinitiate warfarin without bridge on 10/12, confirmed with primary team physician.   INR has trended up to 1.62 but still low. This is likely from the residual effect  of the vit k.   Drug-drug interactions:  Warfarin + amiodarone  Warfarim + allopurinol Interactions noted and will monitor for additive effects of anticoagulation through daily CBC/INR and clinical monitoring.   Goal of Therapy:  INR 2.5 - 3.5 Monitor platelets by anticoagulation protocol: Yes   Plan:  Warfarin 10mg  PO x1 tonight Monitor daily INR and CBC, and signs / symptoms of bleeding  Onnie Boer, PharmD, Jake Samples, AAHIVP, CPP Infectious Disease Pharmacist Pager: [phone] 01/16/2018 8:30 AM

## 2018-01-16 NOTE — Discharge Instructions (Signed)

## 2018-01-16 NOTE — Evaluation (Signed)
Occupational Therapy Evaluation Patient Details Name: [name] MRN: [id number] DOB: May 07, 1935 Today's Date: 01/16/2018    History of Present Illness 82 y.o. M w/ a hx of mechanical aortic valve (1994), OSA on CPAP, permanent A. fib, chronic anticoagulant therapy with Coumadin, chronic combined CHF, CKD stage III, DM, CAD with CABG 1994, COPD, severe mitral regurgitation, carotid stenosis with left carotid enterotomy, HTN, and HLD who was admitted with anemia to 5.6 due to left leg hematoma   Clinical Impression   Pt admitted as above currently demonstrating deficits in his ability to perform ADL's & functional mobility/transfers related to ADL's. PTA pt was Mod I with occasional A from spouse, HHPT or [place] RN PRN. He is highly motivated and currently requiring Min A +2 for transfers & Max A bilateral LE ADL's secondary to pain and generalized weakness. He would benefit from acute care followed by In-pt Rehab/CIR.    Follow Up Recommendations  CIR;Supervision/Assistance - 24 hour    Equipment Recommendations  Other (comment)(Defer to next venue)    Recommendations for Other Services Rehab consult     Precautions / Restrictions Precautions Precautions: Fall Restrictions Weight Bearing Restrictions: No      Mobility Bed Mobility Overal bed mobility: Needs Assistance Bed Mobility: Supine to Sit     Supine to sit: Mod assist;+2 for physical assistance     General bed mobility comments: Pt required 2+ mod assist to support L LE and lift trunk to move from supine to EOB.  Transfers Overall transfer level: Needs assistance Equipment used: Rolling walker (2 wheeled) Transfers: Sit to/from [facility] Sit to Stand: Min assist;+2 physical assistance;+2 safety/equipment Stand pivot transfers: Min assist;+2 safety/equipment       General transfer comment: Pt required 2+ min assist for lifting assist from EOB    Balance Overall balance assessment: Needs  assistance Sitting-balance support: Bilateral upper extremity supported;Feet supported Sitting balance-Leahy Scale: Fair     Standing balance support: Bilateral upper extremity supported Standing balance-Leahy Scale: Fair Standing balance comment: Reliant on RW/UE support                           ADL either performed or assessed with clinical judgement   ADL Overall ADL's : Needs assistance/impaired Eating/Feeding: Set up;Bed level;Sitting   Grooming: Set up;Sitting   Upper Body Bathing: Sitting;Min guard   Lower Body Bathing: Moderate assistance;Sit to/from stand   Upper Body Dressing : Set up;Sitting   Lower Body Dressing: Maximal assistance;Sit to/from stand Lower Body Dressing Details (indicate cue type and reason): Pain, edema and decreased A/ROM LE's currently impacting independence. Pt has a/e at home and was Mod I PTA. Noted: He stated that his wife assists with socks PRN when he has flare up of gout and will wear slippers. Toilet Transfer: Minimal assistance;+2 for safety/equipment;RW;BSC;Ambulation(Simulated EOB several steps to chair)   Toileting- Clothing Manipulation and Hygiene: Minimal assistance;Sit to/from stand;+2 for safety/equipment       Functional mobility during ADLs: Minimal assistance;+2 for safety/equipment;Rolling walker(Increased time/slower pace) General ADL Comments: Pt was seen for OT assessment follwed by participation in brief ADL retraining session with focus on bed mobility, simulated toilet transfer and pt education re: role of OT and plan of care. Pt currently demonstrates deficits in his ability to independently perform ADL's at PLOF (Overall Mod I PTA) and should benefit from acute OT followed by in-pt Rehab to maximize indepdnence for anticipated d/c home w/ PRN spouse assist when  able.     Vision Baseline Vision/History: Wears glasses Wears Glasses: At all times Patient Visual Report: No change from baseline Vision  Assessment?: No apparent visual deficits     Perception     Praxis      Pertinent Vitals/Pain Pain Assessment: Faces Faces Pain Scale: Hurts little more Pain Location: Pt reports no pain at strat of therapy assessment, however he stated his LLE hurt when sitting up at EOB.  Pain Descriptors / Indicators: Aching;Grimacing;Sore Pain Intervention(s): Repositioned;Limited activity within patient's tolerance;Monitored during session     Hand Dominance Right   Extremity/Trunk Assessment Upper Extremity Assessment Upper Extremity Assessment: Generalized weakness;Overall WFL for tasks assessed RUE Deficits / Details: L UE grossly 3+/5, R UE grossly 4/5   Lower Extremity Assessment Lower Extremity Assessment: Defer to PT evaluation   Cervical / Trunk Assessment Cervical / Trunk Assessment: Normal   Communication Communication Communication: No difficulties   Cognition Arousal/Alertness: Awake/alert Behavior During Therapy: WFL for tasks assessed/performed Overall Cognitive Status: Within Functional Limits for tasks assessed                                     General Comments  Pt with rash on his back. R LE is with noted dark purple bruising. Pt lives at home and had PRN Assist from spouse, HHPT and HHRN, however, he reports that he was Mod bathing, dressing (except for occasional assist for donning socks) and functional mobility/transfers.     Exercises     Shoulder Instructions      Home Living Family/patient expects to be discharged to:: Private residence Living Arrangements: Spouse/significant other Available Help at Discharge: Family;Other (Comment)(HH PT amd Nursing) Type of Home: House Home Access: Stairs to enter [employer] of Steps: 1/2 a step   Home Layout: Two level(Mainly uses 1st floor has chair lift if needs to go upstairs)   Alternate Level Stairs-Rails: None Bathroom Shower/Tub: Occupational psychologist:  Standard Bathroom Accessibility: Yes   Home Equipment: Environmental consultant - 4 wheels;Cane - single point;Shower seat;Hand held shower head;Grab bars - toilet Adaptive Equipment: Reacher;Sock aid;Long-handled shoe horn Additional Comments: Pt reports using rollator prior to entering the hospital      Prior Functioning/Environment Level of Independence: Independent with assistive device(s)  Gait / Transfers Assistance Needed: Pt amb with rollator and has HH PT and nursing.  ADL's / Homemaking Assistance Needed: wife assists with socks   Comments: Pt reports he got injured stepping into the shower.        OT Problem List: Decreased activity tolerance;Decreased strength;Decreased knowledge of use of DME or AE;Increased edema;Pain;Impaired balance (sitting and/or standing)      OT Treatment/Interventions: Self-care/ADL training;DME and/or AE instruction;Therapeutic activities;Therapeutic exercise;Energy conservation;Patient/family education    OT Goals(Current goals can be found in the care plan section) Acute Rehab OT Goals Patient Stated Goal: Pt reports that he would like to go to CIR to get stronger and go home OT Goal Formulation: With patient Time For Goal Achievement: 01/30/18 Potential to Achieve Goals: Good  OT Frequency: Min 3X/week   Barriers to D/C:            Co-evaluation              AM-PAC PT "6 Clicks" Daily Activity     Outcome Measure Help from another person eating meals?: None Help from another person taking care of personal grooming?: A Little  Help from another person toileting, which includes using toliet, bedpan, or urinal?: A Lot Help from another person bathing (including washing, rinsing, drying)?: A Lot Help from another person to put on and taking off regular upper body clothing?: None Help from another person to put on and taking off regular lower body clothing?: A Lot 6 Click Score: 17   End of Session Equipment Utilized During Treatment: Gait  belt;Rolling walker;Oxygen(2L O2 throughout session) Nurse Communication: Mobility status;Other (comment)(Pt sitting up in chair)  Activity Tolerance: Patient tolerated treatment well Patient left: in chair;with call bell/phone within reach  OT Visit Diagnosis: Muscle weakness (generalized) (M62.81);Pain Pain - Right/Left: (Bilateral LE's) Pain - part of body: (Bilateral LE's (L > R))                Time: 0712-1975 OT Time Calculation (min): 23 min Charges:  OT General Charges $OT Visit: 1 Visit OT Evaluation $OT Eval Moderate Complexity: 1 Mod OT Treatments $Self Care/Home Management : 8-22 mins }  Almyra Deforest, OTR/L 01/16/2018, 9:10 AM

## 2018-01-16 NOTE — H&P (Signed)
Physical Medicine and Rehabilitation Admission H&P    Chief Complaint  Patient presents with  . Debility    HPI:  [name] is an 82 year old male with history of CAD s/p CABG/St Jude's AVR/CAF- on coumadin, T2DM, OSA, CHF- oxygen dependent with multiple recent admissions for acute on chronic CHF as well as acute blood loss anemia after dental extraction.  He was noted to have supratherapeutic INR of 10 on recent check by MD and was treated with oral vitamin K.  He was readmitted on 01/09/2018 with hematuria, INR 3.8, Hgb- 5.6 and LLE edema and pain.  Work-up revealed intramuscular hematoma in left vastus lateralis 22 cm x 10.8 cm x 6.6 cm and he was treated with 3 units FFP and transfused with 6 total units PRBC.  Dr. Doreatha Martin consulted for input and recommended compressive wraps as neurovascular compromise noted and hematoma would likely take weeks to resolve.  H&H being monitored and is stable. LLE edema treated with demadex and hypokalemia being supplemented.   MASD being monitored.  Coumadin resumed with Lovenox bridge on 10/13 with INR goal 2.5-3 range.  Patient noted to be deconditioned with functional deficits and CIR recommended for follow-up therapy   Review of Systems  Constitutional: Negative for chills and fever.  HENT: Negative for hearing loss and tinnitus.   Eyes: Negative for blurred vision and double vision.  Respiratory: Negative for cough and shortness of breath.   Cardiovascular: Positive for leg swelling. Negative for chest pain and palpitations.  Gastrointestinal: Negative for heartburn, nausea and vomiting.  Genitourinary: Negative for dysuria and urgency.  Musculoskeletal: Positive for myalgias.  Neurological: Negative for dizziness and headaches.  Psychiatric/Behavioral: Negative for memory loss. The patient is not nervous/anxious.      Past Medical History:  Diagnosis Date  . Carotid artery disease ([place]) 1994   s/p left carotid endarerectomy   . Chronic  atrial fibrillation    a. on coumadin   . Chronic diastolic CHF (congestive heart failure) ([facility])   . Diabetes mellitus without complication ([place])    dx 2016  . Dyspnea   . Heart murmur   . Hx of CABG    a. 1994  . Hypertension   . OSA (obstructive sleep apnea)   . Rheumatic fever   . S/P AVR (aortic valve replacement)    a. mechanical valve 1996  . Subclavian bypass stenosis ([place])   . 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 Vitamin B12 deficiency     Past Surgical History:  Procedure Laterality Date  . [place]   replaced due to aortic stenosis, St. Jude mechanical prostesis  . CAROTID ENDARTERECTOMY Left 1997   subclavian bypass Done in [place]  . CORONARY ARTERY BYPASS GRAFT  1994   w SVG to RCA and SVG to circumflex  . heart bypass     Done in [place]  . MULTIPLE EXTRACTIONS WITH ALVEOLOPLASTY Bilateral 12/12/2017   Procedure: Extraction of tooth #'s 1, 12,13,14,15,17,18,19, 29, and 30 with alveoloplasty and gross debridement of remaining teeth`;  Surgeon: Lenn Cal, DDS;  Location: [facility];  Service: Oral Surgery;  Laterality: Bilateral;  MULTIPLE EXTRACTION WITH ALVEOLOPLASTY WITH PRE PROSTHETIC SURGERY AND GROSS DEBRIDEMENT OF REMAINING TEETH  . TEE WITHOUT CARDIOVERSION N/A 10/23/2017   Procedure: TRANSESOPHAGEAL ECHOCARDIOGRAM (TEE);  Surgeon: Larey Dresser, MD;  Location: [facility] ENDOSCOPY;  Service: Cardiovascular;  Laterality: N/A;  . TONSILLECTOMY  Family History  Problem Relation Age of Onset  . Cancer Father        lung   . Hypertension Mother     Social History:  Married. Has been limited due to CHF since 10/2017.  Has been using a rollator since then. He  reports that he quit smoking about 25 years ago. His smoking use included cigarettes. He has a 30.00 pack-year smoking history. He has never used smokeless tobacco. He reports that he drinks a glass of wine 3-4 times a  week.  He reports that he does not use drugs.     Allergies  Allergen Reactions  . Rifampin Rash    May have been caused by Vancomycin or Rifampin (??)  . Vancomycin Rash    May have been caused by Vancomycin or Rifampin (??)    Medications Prior to Admission  Medication Sig Dispense Refill  . allopurinol (ZYLOPRIM) 100 MG tablet Take 1 tablet (100 mg total) by mouth daily. 30 tablet 3  . amiodarone (PACERONE) 200 MG tablet Take 1 tablet (200 mg total) by mouth daily. 60 tablet 3  . cefUROXime (CEFTIN) 250 MG tablet Take 1 tablet (250 mg total) by mouth 2 (two) times daily for 14 days. 14 tablet 0  . ezetimibe-simvastatin (VYTORIN) 10-20 MG tablet Take 1 tablet by mouth at bedtime. 90 tablet 3  . [EXPIRED] phytonadione (MEPHYTON) 5 MG tablet Take 0.5 tablets (2.5 mg total) by mouth once for 1 dose. 1 tablet 0  . Potassium Chloride ER 20 MEQ TBCR Take 40 mEq by mouth daily. PLEASE TAKE EXTRA DOSE EVERY MONDAY 60 tablet 0  . torsemide (DEMADEX) 100 MG tablet Take 1 tablet (100 mg total) by mouth 2 (two) times daily. 60 tablet 3  . enoxaparin (LOVENOX) 100 MG/ML injection Inject 1 mL (100 mg total) into the skin every 12 (twelve) hours. 10 Syringe 1  . metolazone (ZAROXOLYN) 2.5 MG tablet Take 1 tablet (2.5 mg total) by mouth every Monday. 10 tablet 0  . warfarin (COUMADIN) 5 MG tablet Take 1 tablet (5 mg total) by mouth daily at 6 PM. 20 tablet 0    Drug Regimen Review  Drug regimen was reviewed and remains appropriate with no significant issues identified  Home: Home Living Family/patient expects to be discharged to:: Private residence Living Arrangements: Spouse/significant other Available Help at Discharge: Family, Other (Comment)(HH PT amd Nursing) Type of Home: House Home Access: Stairs to enter [employer] of Steps: 1/2 a step Entrance Stairs-Rails: None Home Layout: Two level(Mainly uses 1st floor has chair lift if needs to go upstairs) Alternate Level  Stairs-Rails: None Bathroom Shower/Tub: Multimedia programmer: Standard Bathroom Accessibility: Yes Home Equipment: Environmental consultant - 4 wheels, Cane - single point, Shower seat, Hand held shower head, Grab bars - toilet Adaptive Equipment: Reacher, Sock aid, Long-handled shoe horn Additional Comments: Pt reports using rollator prior to entering the hospital   Functional History: Prior Function Level of Independence: Independent with assistive device(s) Gait / Transfers Assistance Needed: Pt amb with rollator and has [facility] and nursing.  ADL's / Homemaking Assistance Needed: wife assists with socks Comments: Pt reports he got injured stepping into the shower.  Functional Status:  Mobility: Bed Mobility Overal bed mobility: Needs Assistance Bed Mobility: Supine to Sit Supine to sit: Mod assist, +2 for physical assistance General bed mobility comments: Pt required 2+ mod assist to support L LE and lift trunk to move from supine to EOB. Transfers Overall transfer level: Needs  assistance Equipment used: Rolling walker (2 wheeled) Transfers: Sit to/from Stand, [employer] Sit to Stand: Min assist, +2 physical assistance, +2 safety/equipment Stand pivot transfers: Min assist, +2 safety/equipment General transfer comment: Pt required 2+ min assist for lifting assist from EOB Ambulation/Gait Ambulation/Gait assistance: Min assist Gait Distance (Feet): 5 Feet Assistive device: Rolling walker (2 wheeled) Gait Pattern/deviations: Step-to pattern, Decreased stride length, Decreased step length - right, Decreased step length - left, Decreased dorsiflexion - left, Decreased dorsiflexion - right, Trunk flexed General Gait Details: Pt required min assist for amb for sequencing of gait.     ADL: ADL Overall ADL's : Needs assistance/impaired Eating/Feeding: Set up, Bed level, Sitting Grooming: Set up, Sitting Upper Body Bathing: Sitting, Min guard Lower Body Bathing: Moderate  assistance, Sit to/from stand Upper Body Dressing : Set up, Sitting Lower Body Dressing: Maximal assistance, Sit to/from stand Lower Body Dressing Details (indicate cue type and reason): Pain, edema and decreased A/ROM LE's currently impacting independence. Pt has a/e at home and was Mod I PTA. Noted: He stated that his wife assists with socks PRN when he has flare up of gout and will wear slippers. Toilet Transfer: Minimal assistance, +2 for safety/equipment, RW, BSC, Ambulation(Simulated EOB several steps to chair) Toileting- Clothing Manipulation and Hygiene: Minimal assistance, Sit to/from stand, +2 for safety/equipment Functional mobility during ADLs: Minimal assistance, +2 for safety/equipment, Rolling walker(Increased time/slower pace) General ADL Comments: Pt was seen for OT assessment follwed by participation in brief ADL retraining session with focus on bed mobility, simulated toilet transfer and pt education re: role of OT and plan of care. Pt currently demonstrates deficits in his ability to independently perform ADL's at PLOF (Overall Mod I PTA) and should benefit from acute OT followed by in-pt Rehab to maximize indepdnence for anticipated d/c home w/ PRN spouse assist when able.  Cognition: Cognition Overall Cognitive Status: Within Functional Limits for tasks assessed Orientation Level: Oriented X4 Cognition Arousal/Alertness: Awake/alert Behavior During Therapy: WFL for tasks assessed/performed Overall Cognitive Status: Within Functional Limits for tasks assessed   Blood pressure (!) 98/57, pulse 90, temperature (!) 97.4 F (36.3 C), temperature source Oral, resp. rate 18, weight 99 kg, SpO2 98 %. Physical Exam  Nursing note and vitals reviewed. Constitutional: He is oriented to person, place, and time. No distress.  HENT:  Head: Normocephalic.  Eyes: Pupils are equal, round, and reactive to light.  Neck: Normal range of motion.  Cardiovascular: An irregular rhythm  present.  Murmur heard. +click.   Respiratory: Effort normal. No respiratory distress. He has no wheezes. He has no rales.  GI: Soft. He exhibits no distension. There is no tenderness.  Genitourinary:  Genitourinary Comments: Left flank edema noted.  Musculoskeletal: He exhibits edema (bilateral le).  Neurological: He is alert and oriented to person, place, and time. No cranial nerve deficit.  UE 4-5/5. RLE 3/5 prox to 4/5 distal. LLE limited by pain 1-2/5 prox to 3/5 distally. Mild sensory changes in feet  Skin: Skin is warm and dry. He is not diaphoretic.  Stasis changes bilateral shins. Left thigh burn with yellow slough that was cleaned off. Wound bed with adherent yellow eschar. Left buttock with resolving linear blister?  Psychiatric: He has a normal mood and affect. His behavior is normal.      Results for orders placed or performed during the hospital encounter of 01/09/18 (from the past 48 hour(s))  Glucose, capillary     Status: Abnormal   Collection Time: 01/14/18  5:36  PM  Result Value Ref Range   Glucose-Capillary 111 (H) 70 - 99 mg/dL  CBC     Status: Abnormal   Collection Time: 01/14/18  6:01 PM  Result Value Ref Range   WBC 4.9 4.0 - 10.5 K/uL   RBC 3.00 (L) 4.22 - 5.81 MIL/uL   Hemoglobin 8.6 (L) 13.0 - 17.0 g/dL   HCT 27.6 (L) 39.0 - 52.0 %   MCV 92.0 80.0 - 100.0 fL   MCH 28.7 26.0 - 34.0 pg   MCHC 31.2 30.0 - 36.0 g/dL   RDW 19.9 (H) 11.5 - 15.5 %   Platelets 174 150 - 400 K/uL   nRBC 0.0 0.0 - 0.2 %    Comment: Performed at [facility] [address], [place], [place] [address]  Glucose, capillary     Status: Abnormal   Collection Time: 01/14/18  9:38 PM  Result Value Ref Range   Glucose-Capillary 116 (H) 70 - 99 mg/dL  CBC     Status: Abnormal   Collection Time: 01/15/18  4:13 AM  Result Value Ref Range   WBC 4.4 4.0 - 10.5 K/uL   RBC 2.84 (L) 4.22 - 5.81 MIL/uL   Hemoglobin 8.4 (L) 13.0 - 17.0 g/dL   HCT 26.1 (L) 39.0 - 52.0 %   MCV 91.9  80.0 - 100.0 fL   MCH 29.6 26.0 - 34.0 pg   MCHC 32.2 30.0 - 36.0 g/dL   RDW 19.9 (H) 11.5 - 15.5 %   Platelets 173 150 - 400 K/uL   nRBC 0.0 0.0 - 0.2 %    Comment: Performed at [facility], [place] [address], [place], [place] [address]  Protime-INR     Status: Abnormal   Collection Time: 01/15/18  4:13 AM  Result Value Ref Range   Prothrombin Time 17.5 (H) 11.4 - 15.2 seconds   INR 1.45     Comment: Performed at [facility] [address], [place], [place] [address]  Glucose, capillary     Status: Abnormal   Collection Time: 01/15/18  7:48 AM  Result Value Ref Range   Glucose-Capillary 135 (H) 70 - 99 mg/dL  Glucose, capillary     Status: Abnormal   Collection Time: 01/15/18 12:02 PM  Result Value Ref Range   Glucose-Capillary 186 (H) 70 - 99 mg/dL  Glucose, capillary     Status: Abnormal   Collection Time: 01/15/18  3:58 PM  Result Value Ref Range   Glucose-Capillary 124 (H) 70 - 99 mg/dL  Glucose, capillary     Status: Abnormal   Collection Time: 01/15/18  9:41 PM  Result Value Ref Range   Glucose-Capillary 113 (H) 70 - 99 mg/dL  CBC     Status: Abnormal   Collection Time: 01/16/18  3:10 AM  Result Value Ref Range   WBC 4.5 4.0 - 10.5 K/uL   RBC 2.94 (L) 4.22 - 5.81 MIL/uL   Hemoglobin 8.5 (L) 13.0 - 17.0 g/dL   HCT 26.9 (L) 39.0 - 52.0 %   MCV 91.5 80.0 - 100.0 fL   MCH 28.9 26.0 - 34.0 pg   MCHC 31.6 30.0 - 36.0 g/dL   RDW 20.0 (H) 11.5 - 15.5 %   Platelets 203 150 - 400 K/uL   nRBC 0.0 0.0 - 0.2 %    Comment: Performed at [facility], [place] [address] [place], [place] [address]  Protime-INR     Status: Abnormal   Collection  Time: 01/16/18  3:10 AM  Result Value Ref Range   Prothrombin Time 19.0 (H) 11.4 - 15.2 seconds   INR 1.62     Comment: Performed at [facility] [address], [place], [place] [address]  Basic metabolic panel     Status: Abnormal   Collection Time: 01/16/18  3:10 AM  Result Value Ref Range   Sodium 133 (L) 135  - 145 mmol/L   Potassium 2.7 (LL) 3.5 - 5.1 mmol/L    Comment: CRITICAL RESULT CALLED TO, READ BACK BY AND VERIFIED WITH: PIGEON E,RN 01/16/18 0517 WAYK    Chloride 93 (L) 98 - 111 mmol/L   CO2 30 22 - 32 mmol/L   Glucose, Bld 105 (H) 70 - 99 mg/dL   BUN 29 (H) 8 - 23 mg/dL   Creatinine, Ser 1.07 0.61 - 1.24 mg/dL   Calcium 8.2 (L) 8.9 - 10.3 mg/dL   GFR calc non Af Amer >60 >60 mL/min   GFR calc Af Amer >60 >60 mL/min    Comment: (NOTE) The eGFR has been calculated using the CKD EPI equation. This calculation has not been validated in all clinical situations. eGFR's persistently <60 mL/min signify possible Chronic Kidney Disease.    Anion gap 10 5 - 15    Comment: Performed at [facility] [address], [place], [place] [address]  Magnesium     Status: None   Collection Time: 01/16/18  3:10 AM  Result Value Ref Range   Magnesium 1.8 1.7 - 2.4 mg/dL    Comment: Performed at [facility] [address] [place], [place] [address]  Glucose, capillary     Status: Abnormal   Collection Time: 01/16/18  7:58 AM  Result Value Ref Range   Glucose-Capillary 105 (H) 70 - 99 mg/dL  Glucose, capillary     Status: Abnormal   Collection Time: 01/16/18 12:00 PM  Result Value Ref Range   Glucose-Capillary 104 (H) 70 - 99 mg/dL   No results found.     Medical Problem List and Plan: 1.  Functional and mobility deficits secondary to left thigh hematoma and debility  -admit to inpatient rehab today 2.  DVT Prophylaxis/Anticoagulation: Pharmaceutical: Coumadin and Lovenox bridge until INR greater than 2.5 3. Pain Management: Tylenol as needed 4. Mood: LCSW to follow for evaluation and support 5. Neuropsych: This patient is capable of making decisions on his own behalf. 6. Skin/Wound Care: Damp to dry dressing to burn on left thigh to remove fibronecrotic debris.    -Air mattress for pressure relief due to MAST with question blistering.  Maintain adequate nutritional  status. 7. Fluids/Electrolytes/Nutrition: Monitor I's and O's.  Serial check of labs.  Add protein supplements for protein calorie malnutrition 8.  CAD s/p AVR/CAF/severe MR: On Coumadin, amiodarone and torsemide.  Monitor heart rate twice daily 9. LLE hematoma with edema: Continue compressive right wraps as well as elevation.  10. T2DM: Monitor blood sugars AC at bedtime 11. Chronic diastolic CHF: Has been referred to CHF clinic recently and Oxygen dependent continue torsemide.  He reports that Zaroxolyn on hold per cards.   -monitor daily weights 12. ABLA: Continue to monitor with serial checks as INR therapeutic now 13. Hypokalemia: We will increase supplement to 3 times daily due to persistent hypokalemia recheck labs in a.m. 14.  OSA: Wife to bring patient's machine from home to help compliance.    Post Admission Physician Evaluation: 1. Functional deficits secondary  to left  thigh hematoma. 2. Patient is admitted to receive collaborative, interdisciplinary care between the physiatrist, rehab nursing staff, and therapy team. 3. Patient's level of medical complexity and substantial therapy needs in context of that medical necessity cannot be provided at a lesser intensity of care such as a SNF. 4. Patient has experienced substantial functional loss from his/her baseline which was documented above under the "Functional History" and "Functional Status" headings.  Judging by the patient's diagnosis, physical exam, and functional history, the patient has potential for functional progress which will result in measurable gains while on inpatient rehab.  These gains will be of substantial and practical use upon discharge  in facilitating mobility and self-care at the household level. 5. Physiatrist will provide 24 hour management of medical needs as well as oversight of the therapy plan/treatment and provide guidance as appropriate regarding the interaction of the two. 6. The Preadmission Screening  has been reviewed and patient status is unchanged unless otherwise stated above. 7. 24 hour rehab nursing will assist with bladder management, bowel management, safety, skin/wound care, disease management, medication administration, pain management and patient education  and help integrate therapy concepts, techniques,education, etc. 8. PT will assess and treat for/with: Lower extremity strength, range of motion, stamina, balance, functional mobility, safety, adaptive techniques and equipment, NMR, activity tolerance.   Goals are: mod I . 9. OT will assess and treat for/with: ADL's, functional mobility, safety, upper extremity strength, adaptive techniques and equipment, NMR, activity tolerance.   Goals are: mod I. Therapy may proceed with showering this patient. 10. SLP will assess and treat for/with: n/a.  Goals are: n/a. 11. Case Management and Social Worker will assess and treat for psychological issues and discharge planning. 12. Team conference will be held weekly to assess progress toward goals and to determine barriers to discharge. 13. Patient will receive at least 3 hours of therapy per day at least 5 days per week. 14. ELOS: 10-14 days       15. Prognosis:  excellent   I have personally performed a face to face diagnostic evaluation of this patient and formulated the key components of the plan.  Additionally, I have personally reviewed laboratory data, imaging studies, as well as relevant notes and concur with the physician assistant's documentation above.  Meredith Staggers, MD, [facility]    Bary Leriche, PA-C 01/16/2018

## 2018-01-16 NOTE — PMR Pre-admission (Signed)
PMR Admission Coordinator Pre-Admission Assessment  Patient: [name] is an 82 y.o., male MRN: [id number] DOB: 1935/08/05 Height:  6'2" Weight: 99 kg              Insurance Information HMO:     PPO:      PCP:      IPA:      80/20:      OTHER: PRIMARY: Medicare A & B      Policy#: [id number]      Subscriber: Self Pre-Cert#: Eligible       Employer: Retired Benefits:  Phone #: Verified via online portal      Name: Passport One Eff. Date: A:04/03/02 B:09/02/02     Deduct: $1364      Out of Pocket Max: N/A      Life Max: N/A CIR: 100%      SNF: 100% days 1-20; 80% days 21-100 Outpatient: 80%     Co-Pay: 20% Home Health: 100%      Co-Pay: $0 DME: 80%     Co-Pay: 20% Providers: Patient's Choice  SECONDARY: [employer] Employee PPO      Policy#: [id number]       Subscriber: Self     Employer: Retired  Benefits:  Phone #: [phone]       Emergency Contact Information Contact Information    Name Relation Home Work [place] Spouse [phone]  [phone]     Current Medical History  Patient Admitting Diagnosis: Left intramuscular hematoma, debility related to multiple medical conditions  History of Present Illness:  [name] is a 82 y.o. male  CAD s/p CABG/AVR/CAF-on coumadin, T2DM, HTN, CKD, OSA, CHF--oxygen dependent, recent admissions for acute on chronic CHF as well as ABLA after tooth extraction. He was noted to have supratherapeutic INR-10  on recent check being treated with Vitamin K.  He was readmitted on 01/09/2018 with hematuria and  left lower extremity edema and pain due to intra-muscular hematoma in left vastus lateralis 22 x 10.8 x 6.6 cm.. INR 3.8 at admission and hemoglobin was 5.6.  He was treated with 3 units FFP and transfused with 5 units PRBC. Dr. Kermit Balo consulted for input and recommended compressive wraps as no vascular compromise noted and hematoma will likely require weeks to resolve. Coumadin restarted.       Past Medical History  Past  Medical History:  Diagnosis Date  . Carotid artery disease ([place]) 1994   s/p left carotid endarerectomy   . Chronic atrial fibrillation    a. on coumadin   . Chronic diastolic CHF (congestive heart failure) ([place])   . Diabetes mellitus without complication ([place])    dx 2016  . Dyspnea   . Heart murmur   . Hx of CABG    a. 1994  . Hypertension   . OSA (obstructive sleep apnea)   . Rheumatic fever   . S/P AVR (aortic valve replacement)    a. mechanical valve 1996  . Subclavian bypass stenosis ([place])   . 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 Vitamin B12 deficiency     Family History  family history includes Cancer in his father; Hypertension in his mother.  Prior Rehab/Hospitalizations:  Has the patient had major surgery during 100 days prior to admission? Yes  Current Medications   Current Facility-Administered Medications:  .  0.9 %  sodium chloride infusion, 250 mL, Intravenous, PRN, Merton Border, MD .  acetaminophen (TYLENOL)  tablet 650 mg, 650 mg, Oral, Q6H PRN, Cherene Altes, MD, 650 mg at 01/15/18 0844 .  allopurinol (ZYLOPRIM) tablet 100 mg, 100 mg, Oral, Daily, Cherene Altes, MD, 100 mg at 01/18/18 0946 .  amiodarone (PACERONE) tablet 200 mg, 200 mg, Oral, Daily, Merton Border, MD, 200 mg at 01/18/18 0947 .  colchicine tablet 0.3 mg, 0.3 mg, Oral, Daily, Lavina Hamman, MD, 0.3 mg at 01/18/18 0945 .  enoxaparin (LOVENOX) injection 100 mg, 1 mg/kg, Subcutaneous, Q12H, Domenic Polite, MD, 100 mg at 01/18/18 0945 .  ezetimibe-simvastatin (VYTORIN) 10-20 MG per tablet 1 tablet, 1 tablet, Oral, QHS, Cherene Altes, MD, 1 tablet at 01/17/18 2124 .  insulin aspart (novoLOG) injection 0-9 Units, 0-9 Units, Subcutaneous, TID WC, Merton Border, MD, 1 Units at 01/15/18 1703 .  magnesium citrate solution 0.5 Bottle, 0.5 Bottle, Oral, Daily PRN, Cherene Altes, MD, 0.5 Bottle at 01/13/18 1719 .  ondansetron  (ZOFRAN) tablet 4 mg, 4 mg, Oral, Q6H PRN **OR** ondansetron (ZOFRAN) injection 4 mg, 4 mg, Intravenous, Q6H PRN, Merton Border, MD .  polyethylene glycol (MIRALAX / GLYCOLAX) packet 17 g, 17 g, Oral, Daily, Cherene Altes, MD, 17 g at 01/17/18 1009 .  potassium chloride SA (K-DUR,KLOR-CON) CR tablet 40 mEq, 40 mEq, Oral, Weekly, Ronna Polio, RPH, 40 mEq at 01/14/18 1149 .  potassium chloride SA (K-DUR,KLOR-CON) CR tablet 40 mEq, 40 mEq, Oral, BID, Domenic Polite, MD, 40 mEq at 01/18/18 0946 .  senna-docusate (Senokot-S) tablet 1 tablet, 1 tablet, Oral, BID, Cherene Altes, MD, 1 tablet at 01/18/18 0947 .  sodium chloride (OCEAN) 0.65 % nasal spray 1 spray, 1 spray, Each Nare, PRN, Cherene Altes, MD, 1 spray at 01/11/18 2044 .  sodium chloride flush (NS) 0.9 % injection 3 mL, 3 mL, Intravenous, Q12H, Merton Border, MD, 3 mL at 01/18/18 0948 .  sodium chloride flush (NS) 0.9 % injection 3 mL, 3 mL, Intravenous, PRN, Merton Border, MD, 3 mL at 01/09/18 2214 .  torsemide (DEMADEX) tablet 100 mg, 100 mg, Oral, BID, Cherene Altes, MD, 100 mg at 01/18/18 0946 .  Warfarin - Pharmacist Dosing Inpatient, , Does not apply, q1800, Cherene Altes, MD, 1 each at 01/13/18 1723  Patients Current Diet:  Diet Order            Diet heart healthy/carb modified Room service appropriate? Yes; Fluid consistency: Thin  Diet effective now              Precautions / Restrictions Precautions Precautions: Fall Restrictions Weight Bearing Restrictions: No   Has the patient had 2 or more falls or a fall with injury in the past year?Yes, 1 fall with abrasion to eyebrow that resulted in an ED visit   Prior Activity Level Limited Community (1-2x/wk): Patient reports several admission over the last several months.  Prior to this admission he had been working with home health PT and progressed to walking outside with therapy with [facility] and no AD for short household distances.  He lives with his  support spouse who is able to provide Supervision assist.    [place] Devices/Equipment: Environmental consultant (specify type) Home Equipment: Walker - 4 wheels, Cane - single point, Shower seat, Hand held shower head, Grab bars - toilet  Prior Device Use: Indicate devices/aids used by the patient prior to current illness, exacerbation or injury? Walker  Prior Functional Level Prior Function Level of Independence: Independent with assistive device(s)  Gait / Transfers Assistance Needed: Pt amb with rollator and has HH PT and nursing.  ADL's / Homemaking Assistance Needed: wife assists with socks Comments: Pt reports he got injured stepping into the shower.  Self Care: Did the patient need help bathing, dressing, using the toilet or eating? Needed some help with lower body dressing   Indoor Mobility: Did the patient need assistance with walking from room to room (with or without device)? Independent  Stairs: Did the patient need assistance with internal or external stairs (with or without device)? Dependent has a stair lift to second floor  Functional Cognition: Did the patient need help planning regular tasks such as shopping or remembering to take medications? Independent  Current Functional Level Cognition  Overall Cognitive Status: Within Functional Limits for tasks assessed Orientation Level: Oriented X4    Extremity Assessment (includes Sensation/Coordination)  Upper Extremity Assessment: Generalized weakness, Overall WFL for tasks assessed RUE Deficits / Details: L UE grossly 3+/5, R UE grossly 4/5  Lower Extremity Assessment: Defer to PT evaluation RLE Deficits / Details: R PF mmt 4/5 LLE Deficits / Details: L PF mmt 3/5.    ADLs  Overall ADL's : Needs assistance/impaired Eating/Feeding: Set up, Bed level, Sitting Grooming: Set up, Sitting Upper Body Bathing: Sitting, Min guard Lower Body Bathing: Moderate assistance, Sit to/from stand Upper  Body Dressing : Set up, Sitting Lower Body Dressing: Maximal assistance, Sit to/from stand Lower Body Dressing Details (indicate cue type and reason): Pain, edema and decreased A/ROM LE's currently impacting independence. Pt has a/e at home and was Mod I PTA. Noted: He stated that his wife assists with socks PRN when he has flare up of gout and will wear slippers. Toilet Transfer: Minimal assistance, +2 for safety/equipment, RW, BSC, Ambulation(Simulated EOB several steps to chair) Toileting- Clothing Manipulation and Hygiene: Minimal assistance, Sit to/from stand, +2 for safety/equipment Functional mobility during ADLs: Minimal assistance, +2 for safety/equipment, Rolling walker(Increased time/slower pace) General ADL Comments: Pt was seen for OT assessment follwed by participation in brief ADL retraining session with focus on bed mobility, simulated toilet transfer and pt education re: role of OT and plan of care. Pt currently demonstrates deficits in his ability to independently perform ADL's at PLOF (Overall Mod I PTA) and should benefit from acute OT followed by in-pt Rehab to maximize indepdnence for anticipated d/c home w/ PRN spouse assist when able.    Mobility  Overal bed mobility: Needs Assistance Bed Mobility: Supine to Sit Supine to sit: Mod assist General bed mobility comments: Pt required 2+ mod assist to support L LE and lift trunk to move from supine to EOB.    Transfers  Overall transfer level: Needs assistance Equipment used: Rolling walker (2 wheeled) Transfers: Sit to/from Stand, [employer] Transfers Sit to Stand: Mod assist Stand pivot transfers: Mod assist General transfer comment: Min A to power up    Ambulation / Gait / Stairs / Wheelchair Mobility  Ambulation/Gait Ambulation/Gait assistance: Herbalist (Feet): 15 Feet Assistive device: Rolling walker (2 wheeled) Gait Pattern/deviations: Step-to pattern, Decreased stride length, Decreased step length  - right, Decreased step length - left, Decreased dorsiflexion - left, Decreased dorsiflexion - right, Trunk flexed General Gait Details: Pt required min assist for amb for sequencing of gait.     Posture / Balance Dynamic Sitting Balance Sitting balance - Comments: Pt required UE to maintain sitting posture EOB. Pt required assist initally for supine to sitting balance and then was able to sit with  support from his UE.  Balance Overall balance assessment: Needs assistance Sitting-balance support: Bilateral upper extremity supported, Feet supported Sitting balance-Leahy Scale: Fair Sitting balance - Comments: Pt required UE to maintain sitting posture EOB. Pt required assist initally for supine to sitting balance and then was able to sit with support from his UE.  Standing balance support: Bilateral upper extremity supported Standing balance-Leahy Scale: Fair Standing balance comment: Reliant on RW/UE support    Special needs/care consideration BiPAP/CPAP: Yes, BiPAP at home  CPM: No Continuous Drip IV: No Dialysis: No         Life Vest: No Oxygen: Yes, 1.5-2L via nasal cannula  Special Bed: Yes, high low bed  Trach Size: No Wound Vac (area): No      Skin: Blister to left thigh, Rash to back, MASD to sacrum and coccyx, Ecchymosis to abdomen and upper extremities  Bowel mgmt: Last BM 01/16/18 Bladder mgmt: Incontinent with external catheter Diabetic mgmt: Reports he was recently taken off his oral medication for DM and was still checking his blood sugars at home to monitor, was usually ~100     Previous Home Environment Living Arrangements: Spouse/significant other Available Help at Discharge: Family, Other (Comment)(HH PT amd Nursing) Type of Home: House Home Layout: Two level(Mainly uses 1st floor has chair lift if needs to go upstairs) Alternate Level Stairs-Rails: None Home Access: Stairs to enter Entrance Stairs-Rails: None Entrance Stairs-Number of Steps: 1/2 a  step Bathroom Shower/Tub: Multimedia programmer: Associate Professor Accessibility: Yes Home Care Services: Yes Type of Home Care Services: Home RN, [place] (if known): [facility] Additional Comments: Pt reports using rollator prior to entering the hospital  Discharge Living Setting Plans for Discharge Living Setting: Patient's home, Lives with (comment)(Spouse) Type of Home at Discharge: House Discharge Home Layout: Two level, Able to live on main level with bedroom/bathroom(has a bonus room with stair lift to second floor) Alternate Level Stairs-Rails: (stair left) Alternate Level Stairs-Number of Steps: 1 flight  Discharge Home Access: Stairs to enter Entrance Stairs-Rails: None Entrance Stairs-Number of Steps: 1 Discharge Bathroom Shower/Tub: Horticulturist, commercial: Standard Discharge Bathroom Accessibility: Yes How Accessible: Accessible via walker Does the patient have any problems obtaining your medications?: No  Social/Family/Support Systems Patient Roles: Spouse Contact Information: Spouse, Financial trader Anticipated Caregiver: Spouse Anticipated Caregiver's Contact Information: see above Ability/Limitations of Caregiver: Spouse can provide Supervision assist and light ADL assist   Caregiver Availability: 24/7 Discharge Plan Discussed with Primary Caregiver: Yes Is Caregiver In Agreement with Plan?: Yes Does Caregiver/Family have Issues with Lodging/Transportation while Pt is in Rehab?: No  Goals/Additional Needs Patient/Family Goal for Rehab: PT/OT: Mod I  Expected length of stay: 10-14 days Cultural Considerations: Catholic  Dietary Needs: Heart Healthy and Carb. Mod. diet restrictions  Equipment Needs: TBD Pt/Family Agrees to Admission and willing to participate: Yes Program Orientation Provided & Reviewed with Pt/Caregiver Including Roles  & Responsibilities: Yes Additional Information Needs: Patient with recent history  of several admissions over the last 6 months  Information Needs to be Provided By: Team FYI  Barriers to Discharge: Medical stability, Decreased caregiver support  Decrease burden of Care through IP rehab admission: No   Possible need for SNF placement upon discharge: Not anticipated   Patient Condition: This patient's medical and functional status has changed since the consult dated: 01/15/18 at [facility] in which the Rehabilitation Physician determined and documented that the patient's condition is appropriate for intensive rehabilitative care in an  inpatient rehabilitation facility. See "History of Present Illness" (above) for medical update. Functional changes are: Mod A transfers and Min A gait. Patient's medical and functional status update has been discussed with the Rehabilitation physician and patient remains appropriate for inpatient rehabilitation. Will admit to inpatient rehab today.  Preadmission Screen Completed By:  Gunnar Fusi, with brief updates by: Danne Baxter 01/18/2018 10:19 AM ______________________________________________________________________   Discussed status with Dr. Naaman Plummer on 01/18/2018 at  80 and received telephone approval for admission today.  Admission Coordinator: Gunnar Fusi with updates by  Cleatrice Burke, time 1020 date 01/18/2018

## 2018-01-17 LAB — CBC
HEMATOCRIT: 28.6 % — AB (ref 39.0–52.0)
HEMOGLOBIN: 8.8 g/dL — AB (ref 13.0–17.0)
MCH: 28.6 pg (ref 26.0–34.0)
MCHC: 30.8 g/dL (ref 30.0–36.0)
MCV: 92.9 fL (ref 80.0–100.0)
Platelets: 210 10*3/uL (ref 150–400)
RBC: 3.08 MIL/uL — ABNORMAL LOW (ref 4.22–5.81)
RDW: 20.1 % — ABNORMAL HIGH (ref 11.5–15.5)
WBC: 4.5 10*3/uL (ref 4.0–10.5)
nRBC: 0 % (ref 0.0–0.2)

## 2018-01-17 LAB — BASIC METABOLIC PANEL
Anion gap: 10 (ref 5–15)
BUN: 28 mg/dL — ABNORMAL HIGH (ref 8–23)
CO2: 27 mmol/L (ref 22–32)
Calcium: 8.3 mg/dL — ABNORMAL LOW (ref 8.9–10.3)
Chloride: 95 mmol/L — ABNORMAL LOW (ref 98–111)
Creatinine, Ser: 1.03 mg/dL (ref 0.61–1.24)
GFR calc Af Amer: >60 mL/min (ref >60)
GFR calc non Af Amer: >60 mL/min (ref >60)
GLUCOSE: 105 mg/dL — AB (ref 70–99)
POTASSIUM: 3.9 mmol/L (ref 3.5–5.1)
Sodium: 132 mmol/L — ABNORMAL LOW (ref 135–145)

## 2018-01-17 LAB — GLUCOSE, CAPILLARY
GLUCOSE-CAPILLARY: 119 mg/dL — AB (ref 70–99)
Glucose-Capillary: 119 mg/dL — ABNORMAL HIGH (ref 70–99)
Glucose-Capillary: 114 mg/dL — ABNORMAL HIGH (ref 70–99)
Glucose-Capillary: 111 mg/dL — ABNORMAL HIGH (ref 70–99)

## 2018-01-17 LAB — PROTIME-INR
INR: 1.7
Prothrombin Time: 19.7 seconds — ABNORMAL HIGH (ref 11.4–15.2)

## 2018-01-17 MED ORDER — ENOXAPARIN SODIUM 100 MG/ML ~~LOC~~ SOLN
1.0000 mg/kg | Freq: Two times a day (BID) | SUBCUTANEOUS | Status: DC
  Administered 2018-01-17 – 2018-01-18 (×3): 100 mg via SUBCUTANEOUS
  Filled 2018-01-17 (×3): qty 1

## 2018-01-17 MED ORDER — WARFARIN SODIUM 10 MG PO TABS
10.0000 mg | ORAL_TABLET | Freq: Once | ORAL | Status: AC
  Administered 2018-01-17: 10 mg via ORAL
  Filled 2018-01-17: qty 1

## 2018-01-17 NOTE — Progress Notes (Signed)
Patient refused CPAP.

## 2018-01-17 NOTE — Progress Notes (Signed)
ANTICOAGULATION CONSULT NOTE - follow-up Consult  Pharmacy Consult for WARFARIN/lovenox Indication: atrial fibrillation and mechanical aortic valve  Allergies  Allergen Reactions  . Rifampin Rash    May have been caused by Vancomycin or Rifampin (??)  . Vancomycin Rash    May have been caused by Vancomycin or Rifampin (??)    Patient Measurements: Height: 6\' 2"  (188 cm) Weight: 94.2 kg IBW/kg (Calculated) : 82.2   Vital Signs: Temp: 98.3 F (36.8 C) (10/17 0026) Temp Source: Oral (10/17 0026) BP: 101/75 (10/17 0026)  Labs: Recent Labs    01/15/18 0413 01/16/18 0310 01/17/18 0228  HGB 8.4* 8.5* 8.8*  HCT 26.1* 26.9* 28.6*  PLT 173 203 210  LABPROT 17.5* 19.0* 19.7*  INR 1.45 1.62 1.70  CREATININE  --  1.07 1.03    Estimated Creatinine Clearance: 71.9 mL/min (by C-G formula based on SCr of 1.03 mg/dL).   Medical History: Past Medical History:  Diagnosis Date  . Carotid artery disease ([place]) 1994   s/p left carotid endarerectomy   . Chronic atrial fibrillation    a. on coumadin   . Chronic diastolic CHF (congestive heart failure) ([place])   . Diabetes mellitus without complication ([place])    dx 2016  . Dyspnea   . Heart murmur   . Hx of CABG    a. 1994  . Hypertension   . OSA (obstructive sleep apnea)   . Rheumatic fever   . S/P AVR (aortic valve replacement)    a. mechanical valve 1996  . Subclavian bypass stenosis ([place])   . 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 Vitamin B12 deficiency     Medications:  Scheduled:  . allopurinol  100 mg Oral Daily  . amiodarone  200 mg Oral Daily  . colchicine  0.3 mg Oral Daily  . enoxaparin (LOVENOX) injection  1 mg/kg Subcutaneous Q12H  . ezetimibe-simvastatin  1 tablet Oral QHS  . insulin aspart  0-9 Units Subcutaneous TID WC  . polyethylene glycol  17 g Oral Daily  . potassium chloride  40 mEq Oral Weekly  . potassium chloride  40 mEq Oral BID  .  senna-docusate  1 tablet Oral BID  . sodium chloride flush  3 mL Intravenous Q12H  . torsemide  100 mg Oral BID  . warfarin  10 mg Oral ONCE-1800  . Warfarin - Pharmacist Dosing Inpatient   Does not apply q1800    Assessment: Patient is an 74yoM PMH mechanical aortic valve replacement, permanent afib, on warfarin chronically, chronic combined CHF, CKDstage3, DM, CAD with CABG, COPD, severe mitral regurgitation, carotid stenosis with left carotid enterotomy, HTN and HLD.  Patient's PTA warfarin dose was 7.5mg  MWF, 5mg  all other days.   INR was noted to be elevated to 10 and hemoglobin down to 7 two days prior at his PCP's office. Patient given vit K tablet and ultrasound for DVT/hematoma which directed him to the ED.  In the ED, hgb was down to 5.6, INR at 10. Patient admitted and transfused blood and FFP.  Orthopedic surgery suggested compression or TED host; no surgery.  Patient as of 10/12 is s/p 5 units of pRBC and 3 units FFP.  Pharmacy was consulted to reinitiate warfarin without bridge on 10/12, confirmed with primary team physician.   INR has trended up to 1.7 but still low. This is likely from the residual effect of the vit k. D/w with Dr. Broadus John today and  we will bridge with lovenox. Repeat with higher dose coumadin.  Drug-drug interactions:  Warfarin + amiodarone  Warfarim + allopurinol Interactions noted and will monitor for additive effects of anticoagulation through daily CBC/INR and clinical monitoring.   Goal of Therapy:  INR 2.5 - 3.5 Monitor platelets by anticoagulation protocol: Yes   Plan:  Lovenox 100mg  SQ BID Warfarin 10mg  PO x1 tonight Monitor daily INR and CBC, and signs / symptoms of bleeding  Onnie Boer, PharmD, Jake Samples, AAHIVP, CPP Infectious Disease Pharmacist Pager: [phone] 01/17/2018 8:46 AM

## 2018-01-17 NOTE — Progress Notes (Signed)
[facility] Progress Note  Patient: [name] CMK:[id number]   PCP: Cassandria Anger, MD DOB: 30-Aug-1935   DOA: 01/09/2018   DOS: 01/17/2018   Date of Service: the patient was seen and examined on 01/17/2018  Brief hospital course: Pt. with PMH of chronic A. fib on Coumadin, PVD S/P left CEA, chronic diastolic CHF, type II DM, CAD S/P CABG, HTN, OSA, mechanical AVR, B12 deficiency; admitted on 01/09/2018, presented with complaint of shortness of breath, weakness and left leg hematoma, was found to have  hemoglobin of 5.6 and supratherapeutic INR.   Subjective:  -feels well, no complaints  Assessment and Plan: 1.  Acute blood loss anemia/large Left thigh hematoma/Supra therapeutic INR. Presented with hemoglobin of 5.6, and an INR of 10, with spontaneous thigh hematoma S/P 6PRBC transfusion and 3 FFP transfusion. -Stable and improved now -Orthopedic surgery was consulted recommend nonoperative management with compression stockings and follow-up in 3 weeks. -Ambulating, PT eval completed -Restarted Coumadin, will bridge with Lovenox today for Goal INR 2.5-3 -Discharge planning, CIR tomorrow  2.  Chronic A. Fib/Mechanical aortic valve/Supra therapeutic INR. -On admission patient received vitamin K, 3 units of FFP and 5 units of PRBC -Anticoagulation was stopped, INR drifted down, Coumadin resumed without bridging -INR is 1.7 today, plan to start bridging with Lovenox today -Continue amiodarone.  3.  Chronic diastolic CHF/Severe MR/CAD S/P CABG Echocardiogram July 2019 shows 50 to 55% EF.  Severe MR, -Continue home regimen of torsemide, Aldactone -Not a mitral clip candidate. Outpatient follow-up with cardio thoracic surgery. Continue Vytorin.  4.  OSA. Continue nightly BiPAP.  5.  Gout. Continue allopurinol.  6. Acute kidney injury on chronic kidney disease stage III. Baseline renal function between 1.3 and 1.4 Presented with mild worsening of 1.85, now  improving  down to 1.2  7.  Hypokalemia. Replaced  8.  COPD/Chronic respiratory failure. Patient is on home oxygen, 2L  -Stable, Continue inhalers.  9. MASD Monitor.  Frequent turning. -WOC consult appreciated  DVT Prophylaxis:  on therapeutic anticoagulation.  Advance goals of care discussion: full code  Family Communication: wife was present at bedside  Disposition: CIR on Friday if stable  Consultants: orthopedics  Procedures: PRBC  Scheduled Meds: . allopurinol  100 mg Oral Daily  . amiodarone  200 mg Oral Daily  . colchicine  0.3 mg Oral Daily  . enoxaparin (LOVENOX) injection  1 mg/kg Subcutaneous Q12H  . ezetimibe-simvastatin  1 tablet Oral QHS  . insulin aspart  0-9 Units Subcutaneous TID WC  . polyethylene glycol  17 g Oral Daily  . potassium chloride  40 mEq Oral Weekly  . potassium chloride  40 mEq Oral BID  . senna-docusate  1 tablet Oral BID  . sodium chloride flush  3 mL Intravenous Q12H  . torsemide  100 mg Oral BID  . warfarin  10 mg Oral ONCE-1800  . Warfarin - Pharmacist Dosing Inpatient   Does not apply q1800   Continuous Infusions: . sodium chloride     PRN Meds: sodium chloride, acetaminophen, magnesium citrate, ondansetron **OR** ondansetron (ZOFRAN) IV, sodium chloride, sodium chloride flush Antibiotics: Anti-infectives (From admission, onward)   None       Objective: Physical Exam: Vitals:   01/15/18 2325 01/16/18 0925 01/16/18 1736 01/17/18 0026  BP: (!) 109/58 (!) 98/57 97/62 101/75  Pulse: 70 90 77   Resp: 20 18 20 18   Temp: 97.9 F (36.6 C) (!) 97.4 F (36.3 C) 97.7 F (36.5 C) 98.3 F (36.8 C)  TempSrc: Oral Oral  Oral  SpO2: 98% 98% 100% 99%  Weight:        Intake/Output Summary (Last 24 hours) at 01/17/2018 1156 Last data filed at 01/17/2018 0645 Gross per 24 hour  Intake 480 ml  Output 2475 ml  Net -1995 ml   Filed Weights   01/12/18 2300  Weight: 99 kg   Gen: Awake, Alert, Oriented X 3,  HEENT: PERRLA, Neck supple,  no JVD Lungs: improved air movement bilaterally, CTAB CVS: RRR,No Gallops,Rubs or new Murmurs Abd: soft, Non tender, non distended, BS present Extremities:  Left upper thigh with mild swelling, superficial burn with dressing Skin: as above  Data Reviewed: CBC: Recent Labs  Lab 01/14/18 0233 01/14/18 1801 01/15/18 0413 01/16/18 0310 01/17/18 0228  WBC 4.8 4.9 4.4 4.5 4.5  HGB 7.6* 8.6* 8.4* 8.5* 8.8*  HCT 24.1* 27.6* 26.1* 26.9* 28.6*  MCV 91.6 92.0 91.9 91.5 92.9  PLT 165 174 173 203 751   Basic Metabolic Panel: Recent Labs  Lab 01/12/18 0245 01/13/18 0239 01/14/18 0233 01/16/18 0310 01/17/18 0228  NA 129* 130* 131* 133* 132*  K 4.2 3.6 3.1* 2.7* 3.9  CL 96* 96* 95* 93* 95*  CO2 24 25 27 30 27   GLUCOSE 112* 102* 115* 105* 105*  BUN 37* 32* 30* 29* 28*  CREATININE 1.40* 1.29* 1.12 1.07 1.03  CALCIUM 8.2* 8.0* 7.9* 8.2* 8.3*  MG  --  2.0  --  1.8  --     Liver Function Tests: Recent Labs  Lab 01/12/18 0245  AST 32  ALT 15  ALKPHOS 116  BILITOT 2.0*  PROT 5.6*  ALBUMIN 2.0*   No results for input(s): LIPASE, AMYLASE in the last 168 hours. No results for input(s): AMMONIA in the last 168 hours. Coagulation Profile: Recent Labs  Lab 01/13/18 0239 01/14/18 0233 01/15/18 0413 01/16/18 0310 01/17/18 0228  INR 1.29 1.31 1.45 1.62 1.70   Cardiac Enzymes: No results for input(s): CKTOTAL, CKMB, CKMBINDEX, TROPONINI in the last 168 hours. BNP (last 3 results) Recent Labs    03/30/17 1147 05/25/17 1044 07/10/17 1523  PROBNP 1,858* 2,749* 411.0*   CBG: Recent Labs  Lab 01/16/18 1200 01/16/18 1651 01/16/18 2134 01/17/18 0749 01/17/18 1133  GLUCAP 104* 117* 108* 119* 114*   Studies: No results found.   Time spent: 25 minutes  Author: Domenic Polite, MD [facility] Page via [email or web address], password [email or web address] 01/17/2018 11:56 AM  If 7PM-7AM, please contact night-coverage at [email or web address], password [email or web address]

## 2018-01-17 NOTE — Progress Notes (Signed)
Physical Therapy Treatment Patient Details Name: [name] MRN: [id number] DOB: July 07, 1935 Today's Date: 01/17/2018    History of Present Illness 82 y.o. M w/ a hx of mechanical aortic valve (1994), OSA on CPAP, permanent A. fib, chronic anticoagulant therapy with Coumadin, chronic combined CHF, CKD stage III, DM, CAD with CABG 1994, COPD, severe mitral regurgitation, carotid stenosis with left carotid enterotomy, HTN, and HLD who was admitted with anemia to 5.6 due to left leg hematoma    PT Comments    Patient progressing with therapy today, still requiring mod A for tranfers and hands on assistance while ambulating. Now able to ambulate to doorway and back this visit. Pt states it feels great to get up and move but reports feeling extremely weak relative to baseline.  Agree with CIR recs.    Follow Up Recommendations  CIR     Equipment Recommendations  Other (comment)    Recommendations for Other Services       Precautions / Restrictions Precautions Precautions: Fall Restrictions Weight Bearing Restrictions: No    Mobility  Bed Mobility Overal bed mobility: Needs Assistance Bed Mobility: Supine to Sit     Supine to sit: Mod assist        Transfers Overall transfer level: Needs assistance Equipment used: Rolling walker (2 wheeled) Transfers: Sit to/from [facility] Sit to Stand: Mod assist Stand pivot transfers: Mod assist       General transfer comment: Min A to power up  Ambulation/Gait Ambulation/Gait assistance: Min assist Gait Distance (Feet): 15 Feet Assistive device: Rolling walker (2 wheeled)           Stairs             Wheelchair Mobility    Modified Rankin (Stroke Patients Only)       Balance Overall balance assessment: Needs assistance Sitting-balance support: Bilateral upper extremity supported;Feet supported Sitting balance-Leahy Scale: Fair     Standing balance support: Bilateral upper extremity  supported Standing balance-Leahy Scale: Fair Standing balance comment: Reliant on RW/UE support                            Cognition Arousal/Alertness: Awake/alert Behavior During Therapy: WFL for tasks assessed/performed Overall Cognitive Status: Within Functional Limits for tasks assessed                                        Exercises      General Comments        Pertinent Vitals/Pain Pain Assessment: No/denies pain    Home Living                      Prior Function            PT Goals (current goals can now be found in the care plan section) Acute Rehab PT Goals Patient Stated Goal: eager to work with therapy PT Goal Formulation: With patient Time For Goal Achievement: 01/29/18 Potential to Achieve Goals: Good Progress towards PT goals: Progressing toward goals    Frequency    Min 3X/week      PT Plan Current plan remains appropriate    Co-evaluation              AM-PAC PT "6 Clicks" Daily Activity  Outcome Measure  Difficulty turning over in bed (including adjusting bedclothes, sheets and blankets)?:  A Lot Difficulty moving from lying on back to sitting on the side of the bed? : Unable Difficulty sitting down on and standing up from a chair with arms (e.g., wheelchair, bedside commode, etc,.)?: Unable Help needed moving to and from a bed to chair (including a wheelchair)?: A Little Help needed walking in hospital room?: A Lot Help needed climbing 3-5 steps with a railing? : Total 6 Click Score: 10    End of Session Equipment Utilized During Treatment: Gait belt Activity Tolerance: Patient tolerated treatment well Patient left: in chair;with call bell/phone within reach Nurse Communication: Mobility status PT Visit Diagnosis: Unsteadiness on feet (R26.81);Muscle weakness (generalized) (M62.81);Difficulty in walking, not elsewhere classified (R26.2);Pain Pain - Right/Left: Left Pain - part of body:  Leg;Ankle and joints of foot     Time: 7035-0093 PT Time Calculation (min) (ACUTE ONLY): 25 min  Charges:  $Gait Training: 8-22 mins $Therapeutic Activity: 8-22 mins                     Reinaldo Berber, PT, DPT Acute Rehabilitation Services Pager: [phone] Office: [place] 01/17/2018, 11:31 AM

## 2018-01-17 NOTE — Progress Notes (Signed)
Inpatient Rehabilitation  Continuing to follow along for timing of medical readiness.  Note MD plans for IP Rehab tomorrow if medically stable.  I will prepare for admission and one of my co-workers will follow up tomorrow.  Call if questions.   Carmelia Roller., CCC/SLP Admission Coordinator  [place]  Cell [phone]

## 2018-01-17 NOTE — Progress Notes (Signed)
Patient refused CPAP HS tonight 

## 2018-01-18 ENCOUNTER — Other Ambulatory Visit (HOSPITAL_COMMUNITY): Payer: Self-pay | Admitting: Cardiology

## 2018-01-18 ENCOUNTER — Other Ambulatory Visit: Payer: Self-pay

## 2018-01-18 ENCOUNTER — Encounter (HOSPITAL_COMMUNITY): Payer: Self-pay

## 2018-01-18 ENCOUNTER — Inpatient Hospital Stay (HOSPITAL_COMMUNITY)
Admission: RE | Admit: 2018-01-18 | Discharge: 2018-01-31 | DRG: 945 | Disposition: A | Discharge status: Home Health | Payer: Medicare Other | Source: Intra-hospital | Attending: Physical Medicine & Rehabilitation | Admitting: Physical Medicine & Rehabilitation

## 2018-01-18 DIAGNOSIS — Z801 Family history of malignant neoplasm of trachea, bronchus and lung: Secondary | ICD-10-CM | POA: Diagnosis not present

## 2018-01-18 DIAGNOSIS — I4891 Unspecified atrial fibrillation: Secondary | ICD-10-CM | POA: Diagnosis present

## 2018-01-18 DIAGNOSIS — E876 Hypokalemia: Secondary | ICD-10-CM | POA: Diagnosis present

## 2018-01-18 DIAGNOSIS — M7981 Nontraumatic hematoma of soft tissue: Secondary | ICD-10-CM | POA: Diagnosis present

## 2018-01-18 DIAGNOSIS — I2510 Atherosclerotic heart disease of native coronary artery without angina pectoris: Secondary | ICD-10-CM | POA: Diagnosis present

## 2018-01-18 DIAGNOSIS — T148XXA Other injury of unspecified body region, initial encounter: Secondary | ICD-10-CM

## 2018-01-18 DIAGNOSIS — I130 Hypertensive heart and chronic kidney disease with heart failure and stage 1 through stage 4 chronic kidney disease, or unspecified chronic kidney disease: Secondary | ICD-10-CM | POA: Diagnosis present

## 2018-01-18 DIAGNOSIS — Z7901 Long term (current) use of anticoagulants: Secondary | ICD-10-CM

## 2018-01-18 DIAGNOSIS — Z952 Presence of prosthetic heart valve: Secondary | ICD-10-CM | POA: Diagnosis not present

## 2018-01-18 DIAGNOSIS — D62 Acute posthemorrhagic anemia: Secondary | ICD-10-CM | POA: Diagnosis present

## 2018-01-18 DIAGNOSIS — Z8249 Family history of ischemic heart disease and other diseases of the circulatory system: Secondary | ICD-10-CM | POA: Diagnosis not present

## 2018-01-18 DIAGNOSIS — D709 Neutropenia, unspecified: Secondary | ICD-10-CM | POA: Diagnosis present

## 2018-01-18 DIAGNOSIS — Z951 Presence of aortocoronary bypass graft: Secondary | ICD-10-CM | POA: Diagnosis not present

## 2018-01-18 DIAGNOSIS — N179 Acute kidney failure, unspecified: Secondary | ICD-10-CM | POA: Diagnosis present

## 2018-01-18 DIAGNOSIS — G4733 Obstructive sleep apnea (adult) (pediatric): Secondary | ICD-10-CM | POA: Diagnosis present

## 2018-01-18 DIAGNOSIS — Z9981 Dependence on supplemental oxygen: Secondary | ICD-10-CM

## 2018-01-18 DIAGNOSIS — N189 Chronic kidney disease, unspecified: Secondary | ICD-10-CM | POA: Diagnosis present

## 2018-01-18 DIAGNOSIS — I482 Chronic atrial fibrillation, unspecified: Secondary | ICD-10-CM | POA: Diagnosis present

## 2018-01-18 DIAGNOSIS — R5381 Other malaise: Principal | ICD-10-CM | POA: Diagnosis present

## 2018-01-18 DIAGNOSIS — I5032 Chronic diastolic (congestive) heart failure: Secondary | ICD-10-CM | POA: Diagnosis present

## 2018-01-18 DIAGNOSIS — I110 Hypertensive heart disease with heart failure: Secondary | ICD-10-CM | POA: Diagnosis present

## 2018-01-18 DIAGNOSIS — Z87891 Personal history of nicotine dependence: Secondary | ICD-10-CM

## 2018-01-18 DIAGNOSIS — R609 Edema, unspecified: Secondary | ICD-10-CM | POA: Diagnosis present

## 2018-01-18 DIAGNOSIS — E1165 Type 2 diabetes mellitus with hyperglycemia: Secondary | ICD-10-CM

## 2018-01-18 DIAGNOSIS — R7989 Other specified abnormal findings of blood chemistry: Secondary | ICD-10-CM | POA: Diagnosis not present

## 2018-01-18 DIAGNOSIS — D696 Thrombocytopenia, unspecified: Secondary | ICD-10-CM | POA: Diagnosis present

## 2018-01-18 DIAGNOSIS — I5030 Unspecified diastolic (congestive) heart failure: Secondary | ICD-10-CM | POA: Diagnosis not present

## 2018-01-18 LAB — BASIC METABOLIC PANEL
Anion gap: 9 (ref 5–15)
Anion gap: 8 (ref 5–15)
BUN: 31 mg/dL — ABNORMAL HIGH (ref 8–23)
BUN: 30 mg/dL — AB (ref 8–23)
CALCIUM: 8.6 mg/dL — AB (ref 8.9–10.3)
CALCIUM: 8.8 mg/dL — AB (ref 8.9–10.3)
CO2: 31 mmol/L (ref 22–32)
CO2: 31 mmol/L (ref 22–32)
CREATININE: 1.08 mg/dL (ref 0.61–1.24)
CREATININE: 1.06 mg/dL (ref 0.61–1.24)
Chloride: 93 mmol/L — ABNORMAL LOW (ref 98–111)
Chloride: 95 mmol/L — ABNORMAL LOW (ref 98–111)
GFR calc Af Amer: >60 mL/min (ref >60)
Glucose, Bld: 112 mg/dL — ABNORMAL HIGH (ref 70–99)
Glucose, Bld: 108 mg/dL — ABNORMAL HIGH (ref 70–99)
POTASSIUM: 3.5 mmol/L (ref 3.5–5.1)
Potassium: 3.2 mmol/L — ABNORMAL LOW (ref 3.5–5.1)
SODIUM: 133 mmol/L — AB (ref 135–145)
SODIUM: 134 mmol/L — AB (ref 135–145)

## 2018-01-18 LAB — CBC
HCT: 27.6 % — ABNORMAL LOW (ref 39.0–52.0)
Hemoglobin: 8.9 g/dL — ABNORMAL LOW (ref 13.0–17.0)
MCH: 29.9 pg (ref 26.0–34.0)
MCHC: 32.2 g/dL (ref 30.0–36.0)
MCV: 92.6 fL (ref 80.0–100.0)
NRBC: 0 % (ref 0.0–0.2)
PLATELETS: 224 10*3/uL (ref 150–400)
RBC: 2.98 MIL/uL — ABNORMAL LOW (ref 4.22–5.81)
RDW: 20.1 % — AB (ref 11.5–15.5)
WBC: 4.9 10*3/uL (ref 4.0–10.5)

## 2018-01-18 LAB — PROTIME-INR
INR: 2.1
PROTHROMBIN TIME: 23.3 s — AB (ref 11.4–15.2)

## 2018-01-18 LAB — GLUCOSE, CAPILLARY
GLUCOSE-CAPILLARY: 108 mg/dL — AB (ref 70–99)
Glucose-Capillary: 107 mg/dL — ABNORMAL HIGH (ref 70–99)
Glucose-Capillary: 102 mg/dL — ABNORMAL HIGH (ref 70–99)
Glucose-Capillary: 104 mg/dL — ABNORMAL HIGH (ref 70–99)

## 2018-01-18 MED ORDER — WARFARIN - PHARMACIST DOSING INPATIENT
Freq: Every day | Status: DC
  Administered 2018-01-18 – 2018-01-30 (×2)

## 2018-01-18 MED ORDER — PROCHLORPERAZINE EDISYLATE 10 MG/2ML IJ SOLN: 5.0000 mg | Freq: Four times a day (QID) | INTRAMUSCULAR | Status: DC | PRN

## 2018-01-18 MED ORDER — TORSEMIDE 100 MG PO TABS
100.0000 mg | ORAL_TABLET | Freq: Two times a day (BID) | ORAL | Status: DC
  Administered 2018-01-18 – 2018-01-20 (×5): 100 mg via ORAL
  Filled 2018-01-18 (×6): qty 1

## 2018-01-18 MED ORDER — ALUM & MAG HYDROXIDE-SIMETH 200-200-20 MG/5ML PO SUSP
30.0000 mL | ORAL | Status: DC | PRN
Start: 2018-01-18 — End: 2018-01-31

## 2018-01-18 MED ORDER — FLEET ENEMA 7-19 GM/118ML RE ENEM: 1.0000 | ENEMA | Freq: Once | RECTAL | Status: DC | PRN

## 2018-01-18 MED ORDER — ONDANSETRON HCL 4 MG/2ML IJ SOLN: 4.0000 mg | Freq: Four times a day (QID) | INTRAMUSCULAR | Status: DC | PRN

## 2018-01-18 MED ORDER — AMIODARONE HCL 200 MG PO TABS
200.0000 mg | ORAL_TABLET | Freq: Every day | ORAL | Status: DC
  Administered 2018-01-19 – 2018-01-31 (×13): 200 mg via ORAL
  Filled 2018-01-18 (×13): qty 1

## 2018-01-18 MED ORDER — BISACODYL 10 MG RE SUPP: 10.0000 mg | Freq: Every day | RECTAL | Status: DC | PRN

## 2018-01-18 MED ORDER — GUAIFENESIN-DM 100-10 MG/5ML PO SYRP: 5.0000 mL | ORAL_SOLUTION | Freq: Four times a day (QID) | ORAL | Status: DC | PRN

## 2018-01-18 MED ORDER — SALINE SPRAY 0.65 % NA SOLN
1.0000 | NASAL | Status: DC | PRN
  Filled 2018-01-18: qty 44

## 2018-01-18 MED ORDER — ACETAMINOPHEN 325 MG PO TABS
325.0000 mg | ORAL_TABLET | ORAL | Status: DC | PRN
  Administered 2018-01-20 – 2018-01-30 (×8): 650 mg via ORAL
  Filled 2018-01-18 (×9): qty 2

## 2018-01-18 MED ORDER — POLYETHYLENE GLYCOL 3350 17 G PO PACK
17.0000 g | PACK | Freq: Every day | ORAL | Status: DC
  Administered 2018-01-19 – 2018-01-21 (×3): 17 g via ORAL
  Filled 2018-01-18 (×4): qty 1

## 2018-01-18 MED ORDER — COLCHICINE 0.6 MG PO TABS
0.3000 mg | ORAL_TABLET | Freq: Every day | ORAL | Status: AC
  Administered 2018-01-19 – 2018-01-21 (×3): 0.3 mg via ORAL
  Filled 2018-01-18 (×3): qty 0.5

## 2018-01-18 MED ORDER — TRAZODONE HCL 50 MG PO TABS
25.0000 mg | ORAL_TABLET | Freq: Every evening | ORAL | Status: DC | PRN
  Filled 2018-01-18: qty 1

## 2018-01-18 MED ORDER — POTASSIUM CHLORIDE CRYS ER 20 MEQ PO TBCR
40.0000 meq | EXTENDED_RELEASE_TABLET | Freq: Three times a day (TID) | ORAL | Status: DC
  Administered 2018-01-18 – 2018-01-20 (×8): 40 meq via ORAL
  Filled 2018-01-18 (×8): qty 2

## 2018-01-18 MED ORDER — DIPHENHYDRAMINE HCL 12.5 MG/5ML PO ELIX
12.5000 mg | ORAL_SOLUTION | Freq: Four times a day (QID) | ORAL | Status: DC | PRN
  Filled 2018-01-18: qty 10

## 2018-01-18 MED ORDER — PROCHLORPERAZINE 25 MG RE SUPP: 12.5000 mg | Freq: Four times a day (QID) | RECTAL | Status: DC | PRN

## 2018-01-18 MED ORDER — POLYETHYLENE GLYCOL 3350 17 G PO PACK: 17.0000 g | PACK | Freq: Every day | ORAL | Status: DC | PRN

## 2018-01-18 MED ORDER — INSULIN ASPART 100 UNIT/ML ~~LOC~~ SOLN
0.0000 [IU] | Freq: Three times a day (TID) | SUBCUTANEOUS | Status: DC
  Administered 2018-01-22: 1 [IU] via SUBCUTANEOUS

## 2018-01-18 MED ORDER — EZETIMIBE-SIMVASTATIN 10-20 MG PO TABS
1.0000 | ORAL_TABLET | Freq: Every day | ORAL | Status: DC
  Administered 2018-01-18 – 2018-01-30 (×13): 1 via ORAL
  Filled 2018-01-18 (×13): qty 1

## 2018-01-18 MED ORDER — ENOXAPARIN SODIUM 100 MG/ML ~~LOC~~ SOLN
1.0000 mg/kg | Freq: Two times a day (BID) | SUBCUTANEOUS | Status: DC
  Administered 2018-01-18 – 2018-01-20 (×4): 100 mg via SUBCUTANEOUS
  Filled 2018-01-18 (×4): qty 1

## 2018-01-18 MED ORDER — ONDANSETRON HCL 4 MG PO TABS: 4.0000 mg | ORAL_TABLET | Freq: Four times a day (QID) | ORAL | Status: DC | PRN

## 2018-01-18 MED ORDER — POTASSIUM CHLORIDE CRYS ER 20 MEQ PO TBCR
40.0000 meq | EXTENDED_RELEASE_TABLET | Freq: Once | ORAL | Status: AC
  Administered 2018-01-18: 40 meq via ORAL

## 2018-01-18 MED ORDER — WARFARIN SODIUM 7.5 MG PO TABS
7.5000 mg | ORAL_TABLET | Freq: Once | ORAL | Status: AC
  Administered 2018-01-18: 7.5 mg via ORAL
  Filled 2018-01-18: qty 1

## 2018-01-18 MED ORDER — ALLOPURINOL 100 MG PO TABS
100.0000 mg | ORAL_TABLET | Freq: Every day | ORAL | Status: DC
  Administered 2018-01-19 – 2018-01-31 (×13): 100 mg via ORAL
  Filled 2018-01-18 (×13): qty 1

## 2018-01-18 MED ORDER — SENNOSIDES-DOCUSATE SODIUM 8.6-50 MG PO TABS
1.0000 | ORAL_TABLET | Freq: Two times a day (BID) | ORAL | Status: DC
  Administered 2018-01-18 – 2018-01-31 (×26): 1 via ORAL
  Filled 2018-01-18 (×26): qty 1

## 2018-01-18 MED ORDER — PROCHLORPERAZINE MALEATE 5 MG PO TABS: 5.0000 mg | ORAL_TABLET | Freq: Four times a day (QID) | ORAL | Status: DC | PRN

## 2018-01-18 NOTE — Progress Notes (Signed)
Meredith Staggers, MD  Physician  Physical Medicine and Rehabilitation  Consult Note  Signed  Date of Service:  01/15/2018 12:39 PM       Related encounter: ED to Hosp-Admission (Discharged) from 01/09/2018 in [place] [facility] Progressive Care      Signed      Expand All Collapse All    Show:Clear all [x] Manual[x] Template[] Copied  Added by: [x] Love, Ivan Anchors, PA-C[x] Meredith Staggers, MD  [] Hover for details      Physical Medicine and Rehabilitation Consult   Reason for Consult: Debility Referring Physician:  Dr. Marlowe Sax   HPI: Kenrick Pore is a 82 y.o. male  CAD s/p CABG/AVR/CAF-on coumadin, T2DM, HTN, CKD, OSA, CHF--oxygen dependent, recent admissions for acute on chronic CHF as well as ABLA after tooth extraction. He was noted to have supratherapeutic INR-10  on recent check being treated with Vitamin K.  He was readmitted on 01/09/2018 with hematuria and  left lower extremity edema and pain due to intra-muscular hematoma in left vastus lateralis 22 x 10.8 x 6.6 cm.. INR 3.8 at admission and hemoglobin was 5.6.  He was treated with 3 units FFP and transfused with 5 units PRBC. Dr. Kermit Balo consulted for input and recommended compressive wraps as no vascular compromise noted and hematoma will likely require weeks to resolve.  Therapy evaluations done this a.m. revealing limitations due to pain and edema.  CIR recommended for follow-up therapy   Review of Systems  HENT: Negative for hearing loss and tinnitus.   Eyes: Negative for blurred vision and double vision.  Respiratory: Negative for cough, sputum production and stridor.   Cardiovascular: Positive for leg swelling. Negative for chest pain.  Gastrointestinal: Negative for heartburn and nausea.  Genitourinary: Negative for dysuria and urgency.  Musculoskeletal: Positive for myalgias. Negative for back pain and joint pain.  Skin: Negative for itching and rash.  Neurological: Positive for focal weakness.  Negative for dizziness, sensory change and headaches.  Psychiatric/Behavioral: The patient does not have insomnia.          Past Medical History:  Diagnosis Date  . Carotid artery disease ([place]) 1994   s/p left carotid endarerectomy   . Chronic atrial fibrillation    a. on coumadin   . Chronic diastolic CHF (congestive heart failure) ([place])   . Diabetes mellitus without complication ([place])    dx 2016  . Dyspnea   . Heart murmur   . Hx of CABG    a. 1994  . Hypertension   . OSA (obstructive sleep apnea)   . Rheumatic fever   . S/P AVR (aortic valve replacement)    a. mechanical valve 1996  . Subclavian bypass stenosis ([place])   . 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 Vitamin B12 deficiency          Past Surgical History:  Procedure Laterality Date  . [place]   replaced due to aortic stenosis, [employer] mechanical prostesis  . CAROTID ENDARTERECTOMY Left 1997   subclavian bypass Done in [place]  . CORONARY ARTERY BYPASS GRAFT  1994   w SVG to RCA and SVG to circumflex  . heart bypass     Done in [place]  . MULTIPLE EXTRACTIONS WITH ALVEOLOPLASTY Bilateral 12/12/2017   Procedure: Extraction of tooth #'s 1, 12,13,14,15,17,18,19, 29, and 30 with alveoloplasty and gross debridement of remaining teeth`;  Surgeon: Lenn Cal, DDS;  Location: [place];  Service: Oral Surgery;  Laterality: Bilateral;  MULTIPLE EXTRACTION WITH ALVEOLOPLASTY WITH PRE PROSTHETIC SURGERY AND GROSS DEBRIDEMENT OF REMAINING TEETH  . TEE WITHOUT CARDIOVERSION N/A 10/23/2017   Procedure: TRANSESOPHAGEAL ECHOCARDIOGRAM (TEE);  Surgeon: Larey Dresser, MD;  Location[facility];  Service: Cardiovascular;  Laterality: N/A;  . TONSILLECTOMY           Family History  Problem Relation Age of Onset  . Cancer Father        lung   . Hypertension Mother     Social History:  Married.  Has been limited due to CHF since 10/2017.  Has been using a rollator since then. He  reports that he quit smoking about 25 years ago. His smoking use included cigarettes. He has a 30.00 pack-year smoking history. He has never used smokeless tobacco. He reports that he drinks a glass of wine 3-4 times a week.  He reports that he does not use drugs.         Allergies  Allergen Reactions  . Rifampin Rash    May have been caused by Vancomycin or Rifampin (??)  . Vancomycin Rash    May have been caused by Vancomycin or Rifampin (??)          Medications Prior to Admission  Medication Sig Dispense Refill  . allopurinol (ZYLOPRIM) 100 MG tablet Take 1 tablet (100 mg total) by mouth daily. 30 tablet 3  . amiodarone (PACERONE) 200 MG tablet Take 1 tablet (200 mg total) by mouth daily. 60 tablet 3  . cefUROXime (CEFTIN) 250 MG tablet Take 1 tablet (250 mg total) by mouth 2 (two) times daily for 14 days. 14 tablet 0  . ezetimibe-simvastatin (VYTORIN) 10-20 MG tablet Take 1 tablet by mouth at bedtime. 90 tablet 3  . [EXPIRED] phytonadione (MEPHYTON) 5 MG tablet Take 0.5 tablets (2.5 mg total) by mouth once for 1 dose. 1 tablet 0  . Potassium Chloride ER 20 MEQ TBCR Take 40 mEq by mouth daily. PLEASE TAKE EXTRA DOSE EVERY MONDAY 60 tablet 0  . torsemide (DEMADEX) 100 MG tablet Take 1 tablet (100 mg total) by mouth 2 (two) times daily. 60 tablet 3  . enoxaparin (LOVENOX) 100 MG/ML injection Inject 1 mL (100 mg total) into the skin every 12 (twelve) hours. 10 Syringe 1  . metolazone (ZAROXOLYN) 2.5 MG tablet Take 1 tablet (2.5 mg total) by mouth every Monday. 10 tablet 0  . warfarin (COUMADIN) 5 MG tablet Take 1 tablet (5 mg total) by mouth daily at 6 PM. 20 tablet 0    Home: Home Living Family/patient expects to be discharged to:: Private residence Living Arrangements: Spouse/significant other Available Help at Discharge: Family, Other (Comment)(Pt reports having HH PT and nursing ) Type  of Home: House Home Access: Stairs to enter [employer] of Steps: 1/2 a step Entrance Stairs-Rails: None Home Layout: Two level(Pt mainly uses 1st floor. Has a chair lift if he needs to go up stairs.) Alternate Level Stairs-Rails: None Bathroom Shower/Tub: Multimedia programmer: Standard Home Equipment: Other (comment), Cane - single point(rollator ) Additional Comments: Pt reports using rollator prior to entering the hospital  Functional History: Prior Function Level of Independence: Independent with assistive device(s) Gait / Transfers Assistance Needed: Pt amb with rollator and has HH PT and nursing.  Comments: Pt reports he got injured stepping into the shower. Functional Status:  Mobility: Bed Mobility Overal bed mobility: Needs Assistance Bed Mobility: Supine to Sit Supine to sit: Mod assist, +  2 for physical assistance General bed mobility comments: Pt required 2+ mod assist to support L LE and lift trunk to move from supine to EOB. Transfers Overall transfer level: Needs assistance Equipment used: Rolling walker (2 wheeled) Transfers: Sit to/from Stand Sit to Stand: Mod assist, +2 physical assistance General transfer comment: Pt required 2+ mod assist for lifting assist from EOB Ambulation/Gait Ambulation/Gait assistance: Min assist Gait Distance (Feet): 5 Feet Assistive device: Rolling walker (2 wheeled) Gait Pattern/deviations: Step-to pattern, Decreased stride length, Decreased step length - right, Decreased step length - left, Decreased dorsiflexion - left, Decreased dorsiflexion - right, Trunk flexed General Gait Details: Pt required min assist for amb for sequencing of gait.   ADL:  Cognition: Cognition Overall Cognitive Status: Within Functional Limits for tasks assessed Orientation Level: Oriented X4 Cognition Arousal/Alertness: Awake/alert Behavior During Therapy: WFL for tasks assessed/performed Overall Cognitive Status: Within  Functional Limits for tasks assessed  Blood pressure (!) 95/50, pulse 82, temperature 97.7 F (36.5 C), temperature source Oral, resp. rate (!) 22, weight 99 kg, SpO2 97 %. Physical Exam  Nursing note and vitals reviewed. Constitutional: He is oriented to person, place, and time. He appears well-developed. Nasal cannula in place.  HENT:  Head: Normocephalic.  Oxygen via [place]  Eyes: Pupils are equal, round, and reactive to light.  Neck: No thyromegaly present.  Cardiovascular: Normal rate. An irregularly irregular rhythm present.  + click  Respiratory: Effort normal.  GI: He exhibits no distension. There is no tenderness.  Musculoskeletal:  Left thigh with 3+ edema and compressive ace wrap from thigh to foot.. Burn left upper thigh with opsite. RLE with rubor and stasis changes.   Neurological: He is alert and oriented to person, place, and time. No cranial nerve deficit.  UE motor 4/5 prox to distal. RLE 3/5 HF, KE and 4/5 ADF/PF. LLE: limited in HF, KE due to pain, 3/5 ADF/PF. No focal sensory deficits  Psychiatric: He has a normal mood and affect. His behavior is normal.    LabResultsLast24Hours       Results for orders placed or performed during the hospital encounter of 01/09/18 (from the past 24 hour(s))  Glucose, capillary     Status: Abnormal   Collection Time: 01/14/18  5:36 PM  Result Value Ref Range   Glucose-Capillary 111 (H) 70 - 99 mg/dL  CBC     Status: Abnormal   Collection Time: 01/14/18  6:01 PM  Result Value Ref Range   WBC 4.9 4.0 - 10.5 K/uL   RBC 3.00 (L) 4.22 - 5.81 MIL/uL   Hemoglobin 8.6 (L) 13.0 - 17.0 g/dL   HCT 27.6 (L) 39.0 - 52.0 %   MCV 92.0 80.0 - 100.0 fL   MCH 28.7 26.0 - 34.0 pg   MCHC 31.2 30.0 - 36.0 g/dL   RDW 19.9 (H) 11.5 - 15.5 %   Platelets 174 150 - 400 K/uL   nRBC 0.0 0.0 - 0.2 %  Glucose, capillary     Status: Abnormal   Collection Time: 01/14/18  9:38 PM  Result Value Ref Range   Glucose-Capillary 116 (H)  70 - 99 mg/dL  CBC     Status: Abnormal   Collection Time: 01/15/18  4:13 AM  Result Value Ref Range   WBC 4.4 4.0 - 10.5 K/uL   RBC 2.84 (L) 4.22 - 5.81 MIL/uL   Hemoglobin 8.4 (L) 13.0 - 17.0 g/dL   HCT 26.1 (L) 39.0 - 52.0 %   MCV 91.9 80.0 -  100.0 fL   MCH 29.6 26.0 - 34.0 pg   MCHC 32.2 30.0 - 36.0 g/dL   RDW 19.9 (H) 11.5 - 15.5 %   Platelets 173 150 - 400 K/uL   nRBC 0.0 0.0 - 0.2 %  Protime-INR     Status: Abnormal   Collection Time: 01/15/18  4:13 AM  Result Value Ref Range   Prothrombin Time 17.5 (H) 11.4 - 15.2 seconds   INR 1.45   Glucose, capillary     Status: Abnormal   Collection Time: 01/15/18  7:48 AM  Result Value Ref Range   Glucose-Capillary 135 (H) 70 - 99 mg/dL  Glucose, capillary     Status: Abnormal   Collection Time: 01/15/18 12:02 PM  Result Value Ref Range   Glucose-Capillary 186 (H) 70 - 99 mg/dL     ImagingResults(Last48hours)  No results found.     Assessment/Plan: Diagnosis: left intramuscular hematoma, debility related to multiple medical conditions 1. Does the need for close, 24 hr/day medical supervision in concert with the patient's rehab needs make it unreasonable for this patient to be served in a less intensive setting? Yes 2. Co-Morbidities requiring supervision/potential complications: CAD, DM, HTN, CKD, pain mgt, wound care 3. Due to bladder management, bowel management, safety, skin/wound care, disease management, medication administration, pain management and patient education, does the patient require 24 hr/day rehab nursing? Yes 4. Does the patient require coordinated care of a physician, rehab nurse, PT (1-2 hrs/day, 5 days/week) and OT (1-2 hrs/day, 5 days/week) to address physical and functional deficits in the context of the above medical diagnosis(es)? Yes Addressing deficits in the following areas: balance, endurance, locomotion, strength, transferring, bowel/bladder control, bathing, dressing,  feeding, grooming, toileting and psychosocial support 5. Can the patient actively participate in an intensive therapy program of at least 3 hrs of therapy per day at least 5 days per week? Yes 6. The potential for patient to make measurable gains while on inpatient rehab is good 7. Anticipated functional outcomes upon discharge from inpatient rehab are modified independent  with PT, modified independent with OT, n/a with SLP. 8. Estimated rehab length of stay to reach the above functional goals is: 10-14 days 9. Anticipated D/C setting: Home 10. Anticipated post D/C treatments: [place] therapy 11. Overall Rehab/Functional Prognosis: excellent  RECOMMENDATIONS: This patient's condition is appropriate for continued rehabilitative care in the following setting: CIR Patient has agreed to participate in recommended program. Yes Note that insurance prior authorization may be required for reimbursement for recommended care.  Comment: Patient's wife cannot provide physical assistance. Pt has potential to be mod I, supervision. Rehab Admissions Coordinator to follow up.  Thanks,  Meredith Staggers, MD, Mellody Drown  I have personally performed a face to face diagnostic evaluation of this patient. Additionally, I have reviewed and concur with the physician assistant's documentation above.    Bary Leriche, PA-C 01/15/2018        Revision History                        Routing History

## 2018-01-18 NOTE — Discharge Instructions (Addendum)
Inpatient Rehab Discharge Instructions  [name] Discharge date and time: 01/31/18   Activities/Precautions/ Functional Status: Activity: no lifting, driving, or strenuous exercise till cleared by MD Diet: cardiac diet and diabetic diet Wound Care: keep wound clean and dry   Functional status:  ___ No restrictions     ___ Walk up steps independently ___ 24/7 supervision/assistance   ___ Walk up steps with assistance _X__ Intermittent supervision/assistance  ___ Bathe/dress independently ___ Walk with walker     _X__ Bathe/dress with assistance ___ Walk Independently    ___ Shower independently ___ Walk with assistance    ___ Shower with assistance _X__ No alcohol     ___ Return to work/school ________   Special Instructions: 1. Monitor blood sugars 2-3 times. 2. Weight yourself today and daily. 3. HHRN to draw protime and BMET on Monday and call results to [place] coumad[facility].  4. [employer].   --[facility] will provide PT, OT and RN   My questions have been answered and I understand these instructions. I will adhere to these goals and the provided educational materials after my discharge from the hospital.  Patient/Caregiver Signature _______________________________ Date __________  Clinician Signature _______________________________________ Date __________  Please bring this form and your medication list with you to all your follow-up doctor's appointments. Information on my medicine - Coumadin   (Warfarin)  Why was Coumadin prescribed for you? Coumadin was prescribed for you because you have a blood clot or a medical condition that can cause an increased risk of forming blood clots. Blood clots can cause serious health problems by blocking the flow of blood to the heart, lung, or brain. Coumadin can prevent harmful blood clots from forming. As a reminder your indication for Coumadin is:   Blood Clot Prevention After Heart Valve Surgery  What  test will check on my response to Coumadin? While on Coumadin (warfarin) you will need to have an INR test regularly to ensure that your dose is keeping you in the desired range. The INR (international normalized ratio) number is calculated from the result of the laboratory test called prothrombin time (PT).  If an INR APPOINTMENT HAS NOT ALREADY BEEN MADE FOR YOU please schedule an appointment to have this lab work done by your health care provider within 7 days. Your INR goal is usually a number between:  2.5 to 3.5 or your provider may give you a more narrow range like 2.5-3.  Ask your health care provider during an office visit what your goal INR is.  What  do you need to  know  About  COUMADIN? Take Coumadin (warfarin) exactly as prescribed by your healthcare provider about the same time each day.  DO NOT stop taking without talking to the doctor who prescribed the medication.  Stopping without other blood clot prevention medication to take the place of Coumadin may increase your risk of developing a new clot or stroke.  Get refills before you run out.  What do you do if you miss a dose? If you miss a dose, take it as soon as you remember on the same day then continue your regularly scheduled regimen the next day.  Do not take two doses of Coumadin at the same time.  Important Safety Information A possible side effect of Coumadin (Warfarin) is an increased risk of bleeding. You should call your healthcare provider right away if you experience any of the following: ? Bleeding from an injury or your nose that does  not stop. ? Unusual colored urine (red or dark brown) or unusual colored stools (red or black). ? Unusual bruising for unknown reasons. ? A serious fall or if you hit your head (even if there is no bleeding).  Some foods or medicines interact with Coumadin (warfarin) and might alter your response to warfarin. To help avoid this: ? Eat a balanced diet, maintaining a consistent amount  of Vitamin K. ? Notify your provider about major diet changes you plan to make. ? Avoid alcohol or limit your intake to 1 drink for women and 2 drinks for men per day. (1 drink is 5 oz. wine, 12 oz. beer, or 1.5 oz. liquor.)  Make sure that ANY health care provider who prescribes medication for you knows that you are taking Coumadin (warfarin).  Also make sure the healthcare provider who is monitoring your Coumadin knows when you have started a new medication including herbals and non-prescription products.  Coumadin (Warfarin)  Major Drug Interactions  Increased Warfarin Effect Decreased Warfarin Effect  Alcohol (large quantities) Antibiotics (esp. Septra/Bactrim, Flagyl, Cipro) Amiodarone (Cordarone) Aspirin (ASA) Cimetidine (Tagamet) Megestrol (Megace) NSAIDs (ibuprofen, naproxen, etc.) Piroxicam (Feldene) Propafenone (Rythmol SR) Propranolol (Inderal) Isoniazid (INH) Posaconazole (Noxafil) Barbiturates (Phenobarbital) Carbamazepine (Tegretol) Chlordiazepoxide (Librium) Cholestyramine (Questran) Griseofulvin Oral Contraceptives Rifampin Sucralfate (Carafate) Vitamin K   Coumadin (Warfarin) Major Herbal Interactions  Increased Warfarin Effect Decreased Warfarin Effect  Garlic Ginseng Ginkgo biloba Coenzyme Q10 Green tea St. Johns wort    Coumadin (Warfarin) FOOD Interactions  Eat a consistent number of servings per week of foods HIGH in Vitamin K (1 serving =  cup)  Collards (cooked, or boiled & drained) Kale (cooked, or boiled & drained) Mustard greens (cooked, or boiled & drained) Parsley *serving size only =  cup Spinach (cooked, or boiled & drained) Swiss chard (cooked, or boiled & drained) Turnip greens (cooked, or boiled & drained)  Eat a consistent number of servings per week of foods MEDIUM-HIGH in Vitamin K (1 serving = 1 cup)  Asparagus (cooked, or boiled & drained) Broccoli (cooked, boiled & drained, or raw & chopped) Brussel sprouts (cooked, or  boiled & drained) *serving size only =  cup Lettuce, raw (green leaf, endive, romaine) Spinach, raw Turnip greens, raw & chopped   These websites have more information on Coumadin (warfarin):  FailFactory.se; VeganReport.com.au;

## 2018-01-18 NOTE — Progress Notes (Signed)
Cristina Gong, RN  Rehab Admission Coordinator  Physical Medicine and Rehabilitation  PMR Pre-admission  Signed  Date of Service:  01/16/2018 3:26 PM       Related encounter: ED to Hosp-Admission (Discharged) from 01/09/2018 in [facility]      Signed         Show:Clear all [x] Manual[x] Template[x] Copied  Added by: [x] Gertie Fey, Melissa[x] Cristina Gong, RN  [] Hover for details PMR Admission Coordinator Pre-Admission Assessment  Patient: [name] is an 82 y.o., male MRN: [id number] DOB: Jul 03, 1935 Height:  6'2" Weight: 99 kg                                                                                                                                                  Insurance Information HMO:     PPO:      PCP:      IPA:      80/20:      OTHER: PRIMARY: Medicare A & B      Policy#: [id number]      Subscriber: Self Pre-Cert#: Eligible       Employer: Retired Benefits:  Phone #: Verified via online portal      Name: Passport One Eff. Date: A:04/03/02 B:09/02/02     Deduct: $1364      Out of Pocket Max: N/A      Life Max: N/A CIR: 100%      SNF: 100% days 1-20; 80% days 21-100 Outpatient: 80%     Co-Pay: 20% Home Health: 100%      Co-Pay: $0 DME: 80%     Co-Pay: 20% Providers: Patient's Choice  SECONDARY: [employer] Employee PPO      Policy#: [id number]       Subscriber: Self     Employer: Retired  Benefits:  Phone #: [phone]       Emergency [place]    Name Relation Home Work [place] Spouse [phone]  [phone]     Current Medical History  Patient Admitting Diagnosis: Left intramuscular hematoma, debility related to multiple medical conditions  History of Present Illness: [name] a 82 y.o.maleCAD s/p CABG/AVR/CAF-on coumadin, T2DM, HTN, CKD, OSA,CHF--oxygen dependent,recent admissionsforacute on chronic CHFas well asABLAafter tooth  extraction. Hewas noted to havesupratherapeutic INR-10 on recent check being treated with Vitamin K. He was readmitted on 01/09/2018 with hematuria andleft lower extremity edema and paindue to intra-muscular hematomainleft vastus lateralis 22 x 10.8 x 6.6 cm..INR 3.8 at admission and hemoglobin was 5.6. He was treated with 3 unitsFFP and transfused with5 unitsPRBC. Dr. Kermit Balo consulted for input and recommended compressive wraps as no vascular compromise noted and hematoma will likely require weeks to resolve.Coumadin restarted.   Past Medical History      Past Medical History:  Diagnosis Date  .  Carotid artery disease ([place]) 1994   s/p left carotid endarerectomy   . Chronic atrial fibrillation    a. on coumadin   . Chronic diastolic CHF (congestive heart failure) ([place])   . Diabetes mellitus without complication ([place])    dx 2016  . Dyspnea   . Heart murmur   . Hx of CABG    a. 1994  . Hypertension   . OSA (obstructive sleep apnea)   . Rheumatic fever   . S/P AVR (aortic valve replacement)    a. mechanical valve 1996  . Subclavian bypass stenosis ([place])   . 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 Vitamin B12 deficiency     Family History  family history includes Cancer in his father; Hypertension in his mother.  Prior Rehab/Hospitalizations:  Has the patient had major surgery during 100 days prior to admission? Yes  Current Medications   Current Facility-Administered Medications:  .  0.9 %  sodium chloride infusion, 250 mL, Intravenous, PRN, Merton Border, MD .  acetaminophen (TYLENOL) tablet 650 mg, 650 mg, Oral, Q6H PRN, Cherene Altes, MD, 650 mg at 01/15/18 0844 .  allopurinol (ZYLOPRIM) tablet 100 mg, 100 mg, Oral, Daily, Cherene Altes, MD, 100 mg at 01/18/18 0946 .  amiodarone (PACERONE) tablet 200 mg, 200 mg, Oral, Daily, Merton Border, MD, 200 mg at 01/18/18 0947 .   colchicine tablet 0.3 mg, 0.3 mg, Oral, Daily, Lavina Hamman, MD, 0.3 mg at 01/18/18 0945 .  enoxaparin (LOVENOX) injection 100 mg, 1 mg/kg, Subcutaneous, Q12H, Domenic Polite, MD, 100 mg at 01/18/18 0945 .  ezetimibe-simvastatin (VYTORIN) 10-20 MG per tablet 1 tablet, 1 tablet, Oral, QHS, Cherene Altes, MD, 1 tablet at 01/17/18 2124 .  insulin aspart (novoLOG) injection 0-9 Units, 0-9 Units, Subcutaneous, TID WC, Merton Border, MD, 1 Units at 01/15/18 1703 .  magnesium citrate solution 0.5 Bottle, 0.5 Bottle, Oral, Daily PRN, Cherene Altes, MD, 0.5 Bottle at 01/13/18 1719 .  ondansetron (ZOFRAN) tablet 4 mg, 4 mg, Oral, Q6H PRN **OR** ondansetron (ZOFRAN) injection 4 mg, 4 mg, Intravenous, Q6H PRN, Merton Border, MD .  polyethylene glycol (MIRALAX / GLYCOLAX) packet 17 g, 17 g, Oral, Daily, Cherene Altes, MD, 17 g at 01/17/18 1009 .  potassium chloride SA (K-DUR,KLOR-CON) CR tablet 40 mEq, 40 mEq, Oral, Weekly, Ronna Polio, RPH, 40 mEq at 01/14/18 1149 .  potassium chloride SA (K-DUR,KLOR-CON) CR tablet 40 mEq, 40 mEq, Oral, BID, Domenic Polite, MD, 40 mEq at 01/18/18 0946 .  senna-docusate (Senokot-S) tablet 1 tablet, 1 tablet, Oral, BID, Cherene Altes, MD, 1 tablet at 01/18/18 0947 .  sodium chloride (OCEAN) 0.65 % nasal spray 1 spray, 1 spray, Each Nare, PRN, Cherene Altes, MD, 1 spray at 01/11/18 2044 .  sodium chloride flush (NS) 0.9 % injection 3 mL, 3 mL, Intravenous, Q12H, Merton Border, MD, 3 mL at 01/18/18 0948 .  sodium chloride flush (NS) 0.9 % injection 3 mL, 3 mL, Intravenous, PRN, Merton Border, MD, 3 mL at 01/09/18 2214 .  torsemide (DEMADEX) tablet 100 mg, 100 mg, Oral, BID, Cherene Altes, MD, 100 mg at 01/18/18 0946 .  Warfarin - Pharmacist Dosing Inpatient, , Does not apply, q1800, Cherene Altes, MD, 1 each at 01/13/18 1723  Patients Current Diet:     Diet Order  Diet heart healthy/carb modified Room service  appropriate? Yes; Fluid consistency: Thin  Diet effective now               Precautions / Restrictions Precautions Precautions: Fall Restrictions Weight Bearing Restrictions: No   Has the patient had 2 or more falls or a fall with injury in the past year?Yes, 1 fall with abrasion to eyebrow that resulted in an ED visit   Prior Activity Level Limited Community (1-2x/wk): Patient reports several admission over the last several months.  Prior to this admission he had been working with home health PT and progressed to walking outside with therapy with [facility] and no AD for short household distances.  He lives with his support spouse who is able to provide Supervision assist.    [place] / [place] Devices/Equipment: Environmental consultant (specify type) Home Equipment: Walker - 4 wheels, Cane - single point, Shower seat, Hand held shower head, Grab bars - toilet  Prior Device Use: Indicate devices/aids used by the patient prior to current illness, exacerbation or injury? Walker  Prior Functional Level Prior Function Level of Independence: Independent with assistive device(s) Gait / Transfers Assistance Needed: Pt amb with rollator and has HH PT and nursing.  ADL's / Homemaking Assistance Needed: wife assists with socks Comments: Pt reports he got injured stepping into the shower.  Self Care: Did the patient need help bathing, dressing, using the toilet or eating? Needed some help with lower body dressing   Indoor Mobility: Did the patient need assistance with walking from room to room (with or without device)? Independent  Stairs: Did the patient need assistance with internal or external stairs (with or without device)? Dependent has a stair lift to second floor  Functional Cognition: Did the patient need help planning regular tasks such as shopping or remembering to take medications? Independent  Current Functional Level Cognition  Overall Cognitive  Status: Within Functional Limits for tasks assessed Orientation Level: Oriented X4    Extremity Assessment (includes Sensation/Coordination)  Upper Extremity Assessment: Generalized weakness, Overall WFL for tasks assessed RUE Deficits / Details: L UE grossly 3+/5, R UE grossly 4/5  Lower Extremity Assessment: Defer to PT evaluation RLE Deficits / Details: R PF mmt 4/5 LLE Deficits / Details: L PF mmt 3/5.    ADLs  Overall ADL's : Needs assistance/impaired Eating/Feeding: Set up, Bed level, Sitting Grooming: Set up, Sitting Upper Body Bathing: Sitting, Min guard Lower Body Bathing: Moderate assistance, Sit to/from stand Upper Body Dressing : Set up, Sitting Lower Body Dressing: Maximal assistance, Sit to/from stand Lower Body Dressing Details (indicate cue type and reason): Pain, edema and decreased A/ROM LE's currently impacting independence. Pt has a/e at home and was Mod I PTA. Noted: He stated that his wife assists with socks PRN when he has flare up of gout and will wear slippers. Toilet Transfer: Minimal assistance, +2 for safety/equipment, RW, BSC, Ambulation(Simulated EOB several steps to chair) Toileting- Clothing Manipulation and Hygiene: Minimal assistance, Sit to/from stand, +2 for safety/equipment Functional mobility during ADLs: Minimal assistance, +2 for safety/equipment, Rolling walker(Increased time/slower pace) General ADL Comments: Pt was seen for OT assessment follwed by participation in brief ADL retraining session with focus on bed mobility, simulated toilet transfer and pt education re: role of OT and plan of care. Pt currently demonstrates deficits in his ability to independently perform ADL's at PLOF (Overall Mod I PTA) and should benefit from acute OT followed by in-pt Rehab to maximize indepdnence for anticipated d/c  home w/ PRN spouse assist when able.    Mobility  Overal bed mobility: Needs Assistance Bed Mobility: Supine to Sit Supine to sit: Mod  assist General bed mobility comments: Pt required 2+ mod assist to support L LE and lift trunk to move from supine to EOB.    Transfers  Overall transfer level: Needs assistance Equipment used: Rolling walker (2 wheeled) Transfers: Sit to/from Stand, [employer] Transfers Sit to Stand: Mod assist Stand pivot transfers: Mod assist General transfer comment: Min A to power up    Ambulation / Gait / Stairs / Wheelchair Mobility  Ambulation/Gait Ambulation/Gait assistance: Herbalist (Feet): 15 Feet Assistive device: Rolling walker (2 wheeled) Gait Pattern/deviations: Step-to pattern, Decreased stride length, Decreased step length - right, Decreased step length - left, Decreased dorsiflexion - left, Decreased dorsiflexion - right, Trunk flexed General Gait Details: Pt required min assist for amb for sequencing of gait.     Posture / Balance Dynamic Sitting Balance Sitting balance - Comments: Pt required UE to maintain sitting posture EOB. Pt required assist initally for supine to sitting balance and then was able to sit with support from his UE.  Balance Overall balance assessment: Needs assistance Sitting-balance support: Bilateral upper extremity supported, Feet supported Sitting balance-Leahy Scale: Fair Sitting balance - Comments: Pt required UE to maintain sitting posture EOB. Pt required assist initally for supine to sitting balance and then was able to sit with support from his UE.  Standing balance support: Bilateral upper extremity supported Standing balance-Leahy Scale: Fair Standing balance comment: Reliant on RW/UE support    Special needs/care consideration BiPAP/CPAP: Yes, BiPAP at home  CPM: No Continuous Drip IV: No Dialysis: No         Life Vest: No Oxygen: Yes, 1.5-2L via nasal cannula  Special Bed: Yes, high low bed  Trach Size: No Wound Vac (area): No      Skin: Blister to left thigh, Rash to back, MASD to sacrum and coccyx, Ecchymosis to  abdomen and upper extremities  Bowel mgmt: Last BM 01/16/18 Bladder mgmt: Incontinent with external catheter Diabetic mgmt: Reports he was recently taken off his oral medication for DM and was still checking his blood sugars at home to monitor, was usually ~100     Previous Home Environment Living Arrangements: Spouse/significant other Available Help at Discharge: Family, Other (Comment)(HH PT amd Nursing) Type of Home: House Home Layout: Two level(Mainly uses 1st floor has chair lift if needs to go upstairs) Alternate Level Stairs-Rails: None Home Access: Stairs to enter Entrance Stairs-Rails: None Entrance Stairs-Number of Steps: 1/2 a step Bathroom Shower/Tub: Multimedia programmer: Associate Professor Accessibility: Yes Home Care Services: Yes Type of Home Care Services: Home RN, [place] (if known): [facility] Additional Comments: Pt reports using rollator prior to entering the hospital  Discharge Living Setting Plans for Discharge Living Setting: Patient's home, Lives with (comment)(Spouse) Type of Home at Discharge: House Discharge Home Layout: Two level, Able to live on main level with bedroom/bathroom(has a bonus room with stair lift to second floor) Alternate Level Stairs-Rails: (stair left) Alternate Level Stairs-Number of Steps: 1 flight  Discharge Home Access: Stairs to enter Entrance Stairs-Rails: None Entrance Stairs-Number of Steps: 1 Discharge Bathroom Shower/Tub: Walk-in shower Discharge Bathroom Toilet: Standard Discharge Bathroom Accessibility: Yes How Accessible: Accessible via walker Does the patient have any problems obtaining your medications?: No  Social/Family/Support Systems Patient Roles: Spouse Contact Information: Spouse, [name] Anticipated Caregiver: Spouse Anticipated Caregiver's  Contact Information: see above Ability/Limitations of Caregiver: Spouse can provide Supervision assist and light ADL assist    Caregiver Availability: 24/7 Discharge Plan Discussed with Primary Caregiver: Yes Is Caregiver In Agreement with Plan?: Yes Does Caregiver/Family have Issues with Lodging/Transportation while Pt is in Rehab?: No  Goals/Additional Needs Patient/Family Goal for Rehab: PT/OT: Mod I  Expected length of stay: 10-14 days Cultural Considerations: Catholic  Dietary Needs: Heart Healthy and Carb. Mod. diet restrictions  Equipment Needs: TBD Pt/Family Agrees to Admission and willing to participate: Yes Program Orientation Provided & Reviewed with Pt/Caregiver Including Roles  & Responsibilities: Yes Additional Information Needs: Patient with recent history of several admissions over the last 6 months  Information Needs to be Provided By: Team FYI  Barriers to Discharge: Medical stability, Decreased caregiver support  Decrease burden of Care through IP rehab admission: No   Possible need for SNF placement upon discharge: Not anticipated   Patient Condition: This patient's medical and functional status has changed since the consult dated: 01/15/18 at [facility] in which the Rehabilitation Physician determined and documented that the patient's condition is appropriate for intensive rehabilitative care in an inpatient rehabilitation facility. See "History of Present Illness" (above) for medical update. Functional changes are: Mod A transfers and Min A gait. Patient's medical and functional status update has been discussed with the Rehabilitation physician and patient remains appropriate for inpatient rehabilitation. Will admit to inpatient rehab today.  Preadmission Screen Completed By:  Gunnar Fusi, with brief updates by: Danne Baxter 01/18/2018 10:19 AM ______________________________________________________________________   Discussed status with Dr. Naaman Plummer on 01/18/2018 at  6 and received telephone approval for admission today.  Admission Coordinator: Gunnar Fusi with updates by  Cleatrice Burke, time 1020 date 01/18/2018           Cosigned by: Meredith Staggers, MD at 01/18/2018 10:24 AM  Revision History

## 2018-01-18 NOTE — H&P (Signed)
Physical Medicine and Rehabilitation Admission H&P     Chief Complaint  Patient presents with  . Debility  HPI: [name] is an 82 year old male with history of CAD s/p CABG/St Jude's AVR/CAF- on coumadin, T2DM, OSA, CHF- oxygen dependent with multiple recent admissions for acute on chronic CHF as well as acute blood loss anemia after dental extraction. He was noted to have supratherapeutic INR of 10 on recent check by MD and was treated with oral vitamin K. He was readmitted on 01/09/2018 with hematuria, INR 3.8, Hgb- 5.6 and LLE edema and pain. Work-up revealed intramuscular hematoma in left vastus lateralis 22 cm x 10.8 cm x 6.6 cm and he was treated with 3 units FFP and transfused with 6 total units PRBC. Dr. Doreatha Martin consulted for input and recommended compressive wraps as neurovascular compromise noted and hematoma would likely take weeks to resolve. H&H being monitored and is stable. LLE edema treated with demadex and hypokalemia being supplemented. MASD being monitored. Coumadin resumed with Lovenox bridge on 10/13 with INR goal 2.5-3 range. Patient noted to be deconditioned with functional deficits and CIR recommended for follow-up therapy  Review of Systems  Constitutional: Negative for chills and fever.  HENT: Negative for hearing loss and tinnitus.  Eyes: Negative for blurred vision and double vision.  Respiratory: Negative for cough and shortness of breath.  Cardiovascular: Positive for leg swelling. Negative for chest pain and palpitations.  Gastrointestinal: Negative for heartburn, nausea and vomiting.  Genitourinary: Negative for dysuria and urgency.  Musculoskeletal: Positive for myalgias.  Neurological: Negative for dizziness and headaches.  Psychiatric/Behavioral: Negative for memory loss. The patient is not nervous/anxious.       Past Medical History:  Diagnosis Date  . Carotid artery disease ([place]) 1994   s/p left carotid endarerectomy   . Chronic atrial fibrillation      a. on coumadin   . Chronic diastolic CHF (congestive heart failure) ([place])   . Diabetes mellitus without complication ([place])    dx 2016  . Dyspnea   . Heart murmur   . Hx of CABG    a. 1994  . Hypertension   . OSA (obstructive sleep apnea)   . Rheumatic fever   . S/P AVR (aortic valve replacement)    a. mechanical valve 1996  . Subclavian bypass stenosis ([place])   . 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 Vitamin B12 deficiency         Past Surgical History:  Procedure Laterality Date  . [place]   replaced due to aortic stenosis, St. Jude mechanical prostesis  . CAROTID ENDARTERECTOMY Left 1997   subclavian bypass Done in [place]  . CORONARY ARTERY BYPASS GRAFT  1994   w SVG to RCA and SVG to circumflex  . heart bypass     Done in [place]  . MULTIPLE EXTRACTIONS WITH ALVEOLOPLASTY Bilateral 12/12/2017   Procedure: Extraction of tooth #'s 1, 12,13,14,15,17,18,19, 29, and 30 with alveoloplasty and gross debridement of remaining teeth`; Surgeon: Lenn Cal, DDS; Location: [facility]; Service: Oral Surgery; Laterality: Bilateral; MULTIPLE EXTRACTION WITH ALVEOLOPLASTY WITH PRE PROSTHETIC SURGERY AND GROSS DEBRIDEMENT OF REMAINING TEETH  . TEE WITHOUT CARDIOVERSION N/A 10/23/2017   Procedure: TRANSESOPHAGEAL ECHOCARDIOGRAM (TEE); Surgeon: Larey Dresser, MD; Location: [facility] ENDOSCOPY; Service: Cardiovascular; Laterality: N/A;  . TONSILLECTOMY          Family History  Problem Relation Age of Onset  .  Cancer Father    lung   . Hypertension Mother    Social History: Married. Has been limited due to CHF since 10/2017. Has been using a rollator since then. He reports that he quit smoking about 25 years ago. His smoking use included cigarettes. He has a 30.00 pack-year smoking history. He has never used smokeless tobacco. He reports that he drinks a glass of wine 3-4 times a week. He reports that he does not  use drugs.       Allergies  Allergen Reactions  . Rifampin Rash    May have been caused by Vancomycin or Rifampin (??)  . Vancomycin Rash    May have been caused by Vancomycin or Rifampin (??)         Medications Prior to Admission  Medication Sig Dispense Refill  . allopurinol (ZYLOPRIM) 100 MG tablet Take 1 tablet (100 mg total) by mouth daily. 30 tablet 3  . amiodarone (PACERONE) 200 MG tablet Take 1 tablet (200 mg total) by mouth daily. 60 tablet 3  . cefUROXime (CEFTIN) 250 MG tablet Take 1 tablet (250 mg total) by mouth 2 (two) times daily for 14 days. 14 tablet 0  . ezetimibe-simvastatin (VYTORIN) 10-20 MG tablet Take 1 tablet by mouth at bedtime. 90 tablet 3  . [EXPIRED] phytonadione (MEPHYTON) 5 MG tablet Take 0.5 tablets (2.5 mg total) by mouth once for 1 dose. 1 tablet 0  . Potassium Chloride ER 20 MEQ TBCR Take 40 mEq by mouth daily. PLEASE TAKE EXTRA DOSE EVERY MONDAY 60 tablet 0  . torsemide (DEMADEX) 100 MG tablet Take 1 tablet (100 mg total) by mouth 2 (two) times daily. 60 tablet 3  . enoxaparin (LOVENOX) 100 MG/ML injection Inject 1 mL (100 mg total) into the skin every 12 (twelve) hours. 10 Syringe 1  . metolazone (ZAROXOLYN) 2.5 MG tablet Take 1 tablet (2.5 mg total) by mouth every Monday. 10 tablet 0  . warfarin (COUMADIN) 5 MG tablet Take 1 tablet (5 mg total) by mouth daily at 6 PM. 20 tablet 0   Drug Regimen Review  Drug regimen was reviewed and remains appropriate with no significant issues identified  Home:  Home Living  Family/patient expects to be discharged to:: Private residence  Living Arrangements: Spouse/significant other  Available Help at Discharge: Family, Other (Comment)(HH PT amd Nursing)  Type of Home: House  Home Access: Stairs to enter  [employer] of Steps: 1/2 a step  Entrance Stairs-Rails: None  Home Layout: Two level(Mainly uses 1st floor has chair lift if needs to go upstairs)  Alternate Level Stairs-Rails: None  Bathroom  Shower/Tub: Tourist information centre manager: Standard  Bathroom Accessibility: Yes  Home Equipment: Environmental consultant - 4 wheels, Cane - single point, Shower seat, Hand held shower head, Grab bars - toilet  Adaptive Equipment: Reacher, Sock aid, Long-handled shoe horn  Additional Comments: Pt reports using rollator prior to entering the hospital  Functional History:  Prior Function  Level of Independence: Independent with assistive device(s)  Gait / Transfers Assistance Needed: Pt amb with rollator and has [facility] and nursing.  ADL's / Homemaking Assistance Needed: wife assists with socks  Comments: Pt reports he got injured stepping into the shower.  Functional Status:  Mobility:  Bed Mobility  Overal bed mobility: Needs Assistance  Bed Mobility: Supine to Sit  Supine to sit: Mod assist, +2 for physical assistance  General bed mobility comments: Pt required 2+ mod assist to support L LE and lift trunk  to move from supine to EOB.  Transfers  Overall transfer level: Needs assistance  Equipment used: Rolling walker (2 wheeled)  Transfers: Sit to/from Stand, Stand Pivot Transfers  Sit to Stand: Min assist, +2 physical assistance, +2 safety/equipment  Stand pivot transfers: Min assist, +2 safety/equipment  General transfer comment: Pt required 2+ min assist for lifting assist from EOB  Ambulation/Gait  Ambulation/Gait assistance: Min assist  Gait Distance (Feet): 5 Feet  Assistive device: Rolling walker (2 wheeled)  Gait Pattern/deviations: Step-to pattern, Decreased stride length, Decreased step length - right, Decreased step length - left, Decreased dorsiflexion - left, Decreased dorsiflexion - right, Trunk flexed  General Gait Details: Pt required min assist for amb for sequencing of gait.   ADL:  ADL  Overall ADL's : Needs assistance/impaired  Eating/Feeding: Set up, Bed level, Sitting  Grooming: Set up, Sitting  Upper Body Bathing: Sitting, Min guard  Lower Body Bathing: Moderate  assistance, Sit to/from stand  Upper Body Dressing : Set up, Sitting  Lower Body Dressing: Maximal assistance, Sit to/from stand  Lower Body Dressing Details (indicate cue type and reason): Pain, edema and decreased A/ROM LE's currently impacting independence. Pt has a/e at home and was Mod I PTA. Noted: He stated that his wife assists with socks PRN when he has flare up of gout and will wear slippers.  Toilet Transfer: Minimal assistance, +2 for safety/equipment, RW, BSC, Ambulation(Simulated EOB several steps to chair)  Toileting- Clothing Manipulation and Hygiene: Minimal assistance, Sit to/from stand, +2 for safety/equipment  Functional mobility during ADLs: Minimal assistance, +2 for safety/equipment, Rolling walker(Increased time/slower pace)  General ADL Comments: Pt was seen for OT assessment follwed by participation in brief ADL retraining session with focus on bed mobility, simulated toilet transfer and pt education re: role of OT and plan of care. Pt currently demonstrates deficits in his ability to independently perform ADL's at PLOF (Overall Mod I PTA) and should benefit from acute OT followed by in-pt Rehab to maximize indepdnence for anticipated d/c home w/ PRN spouse assist when able.  Cognition:  Cognition  Overall Cognitive Status: Within Functional Limits for tasks assessed  Orientation Level: Oriented X4  Cognition  Arousal/Alertness: Awake/alert  Behavior During Therapy: WFL for tasks assessed/performed  Overall Cognitive Status: Within Functional Limits for tasks assessed  Blood pressure (!) 98/57, pulse 90, temperature (!) 97.4 F (36.3 C), temperature source Oral, resp. rate 18, weight 99 kg, SpO2 98 %.  Physical Exam  Nursing note and vitals reviewed.  Constitutional: He is oriented to person, place, and time. No distress.  HENT:  Head: Normocephalic.  Eyes: Pupils are equal, round, and reactive to light.  Neck: Normal range of motion.  Cardiovascular: An irregular  rhythm present.  Murmur heard. +click.  Respiratory: Effort normal. No respiratory distress. He has no wheezes. He has no rales.  GI: Soft. He exhibits no distension. There is no tenderness.  Genitourinary:  Genitourinary Comments: Left flank edema noted.  Musculoskeletal: He exhibits edema (bilateral le).  Neurological: He is alert and oriented to person, place, and time. No cranial nerve deficit.  UE 4-5/5. RLE 3/5 prox to 4/5 distal. LLE limited by pain 1-2/5 prox to 3/5 distally. Mild sensory changes in feet  Skin: Skin is warm and dry. He is not diaphoretic.  Stasis changes bilateral shins. Left thigh burn with yellow slough that was cleaned off. Wound bed with adherent yellow eschar. Left buttock with resolving linear blister?  Psychiatric: He has a normal mood and affect.  His behavior is normal.   Lab Results Last 48 Hours                                                                                                                                                                                                                                                                                                                                                                                                                                                                                                                                                                                           Imaging Results (Last 48 hours)     Medical Problem List and Plan:  1. Functional and mobility  deficits secondary to left thigh hematoma and debility  -admit to inpatient rehab today  2. DVT Prophylaxis/Anticoagulation: Pharmaceutical: Coumadin and Lovenox bridge until INR greater than 2.5  3. Pain Management: Tylenol as needed  4. Mood: LCSW to follow for evaluation and support  5. Neuropsych: This  patient is capable of making decisions on his own behalf.  6. Skin/Wound Care: Damp to dry dressing to burn on left thigh to remove fibronecrotic debris.  -Air mattress for pressure relief due to MAST with question blistering. Maintain adequate nutritional status.  7. Fluids/Electrolytes/Nutrition: Monitor I's and O's. Serial check of labs. Add protein supplements for protein calorie malnutrition  8. CAD s/p AVR/CAF/severe MR: On Coumadin, amiodarone and torsemide. Monitor heart rate twice daily  9. LLE hematoma with edema: Continue compressive right wraps as well as elevation.  10. T2DM: Monitor blood sugars AC at bedtime  11. Chronic diastolic CHF: Has been referred to CHF clinic recently and Oxygen dependent continue torsemide. He reports that Zaroxolyn on hold per cards.  -monitor daily weights  12. ABLA: Continue to monitor with serial checks as INR therapeutic now  13. Hypokalemia: We will increase supplement to 3 times daily due to persistent hypokalemia recheck labs in a.m.  14. OSA: Wife to bring patient's machine from home to help compliance.    Post Admission Physician Evaluation:  1. Functional deficits secondary to left thigh hematoma. 2. Patient is admitted to receive collaborative, interdisciplinary care between the physiatrist, rehab nursing staff, and therapy team. 3. Patient's level of medical complexity and substantial therapy needs in context of that medical necessity cannot be provided at a lesser intensity of care such as a SNF. 4. Patient has experienced substantial functional loss from his/her baseline which was documented above under the "Functional History" and "Functional Status" headings. Judging by the patient's diagnosis, physical exam, and functional history, the patient has potential for functional progress which will result in measurable gains while on inpatient rehab. These gains will be of substantial and practical use upon discharge in facilitating mobility and  self-care at the household level. 5. Physiatrist will provide 24 hour management of medical needs as well as oversight of the therapy plan/treatment and provide guidance as appropriate regarding the interaction of the two. 6. The Preadmission Screening has been reviewed and patient status is unchanged unless otherwise stated above. 7. 24 hour rehab nursing will assist with bladder management, bowel management, safety, skin/wound care, disease management, medication administration, pain management and patient education and help integrate therapy concepts, techniques,education, etc. 8. PT will assess and treat for/with: Lower extremity strength, range of motion, stamina, balance, functional mobility, safety, adaptive techniques and equipment, NMR, activity tolerance. Goals are: mod I . 9. OT will assess and treat for/with: ADL's, functional mobility, safety, upper extremity strength, adaptive techniques and equipment, NMR, activity tolerance. Goals are: mod I. Therapy may proceed with showering this patient. 10. SLP will assess and treat for/with: n/a. Goals are: n/a. 11. Case Management and Social Worker will assess and treat for psychological issues and discharge planning. 12. Team conference will be held weekly to assess progress toward goals and to determine barriers to discharge. 13. Patient will receive at least 3 hours of therapy per day at least 5 days per week. 14. ELOS: 10-14 days  15. Prognosis: excellent    I have personally performed a face to face diagnostic evaluation of this patient and formulated the key components of the plan. Additionally, I have personally reviewed laboratory data, imaging studies, as  well as relevant notes and concur with the physician assistant's documentation above.   Meredith Staggers, MD, Mellody Drown  Bary Leriche, PA-C  01/16/2018   The patient's status has not changed. The original post admission physician evaluation remains appropriate, and any changes from  the pre-admission screening or documentation from the acute chart are noted above.  Meredith Staggers, MD 01/18/2018

## 2018-01-18 NOTE — Progress Notes (Signed)
Inpatient Rehabilitation Admissions Coordinator  I met with pt and contacted Dr. Broadus John to clarify medical readiness for inpt rehab admit today. They are in agreement. I have notified RN CM of admit. I will make the arrangements to admit today.  Danne Baxter, RN, MSN Rehab Admissions Coordinator [phone] 01/18/2018 10:15 AM

## 2018-01-18 NOTE — Progress Notes (Signed)
[place] for warfarin/lovenox Indication: atrial fibrillation and mechanical aortic valve  Labs: Recent Labs    01/16/18 0310 01/17/18 0228 01/18/18 0224  HGB 8.5* 8.8* 8.9*  HCT 26.9* 28.6* 27.6*  PLT 203 210 224  LABPROT 19.0* 19.7* 23.3*  INR 1.62 1.70 2.10  CREATININE 1.07 1.03 1.08    Estimated Creatinine Clearance: 68.6 mL/min (by C-G formula based on SCr of 1.08 mg/dL).   Assessment: Patient is an 64yoM with history of mechanical aortic valve replacement, permanent afib continue on warfarin with lovenox bridge. Warfarin reversed during hospitalization but now restarted. INR remains subtherapeutic at 2.1. No bleeding noted.   PTA dose: 7.5mg  MWF, 5mg  all other days  Goal of Therapy:  INR 2.5 - 3.5 Monitor platelets by anticoagulation protocol: Yes   Plan:  Continue Lovenox 100mg  SQ BID Warfarin 7.5mg  PO x1 tonight Monitor daily INR and CBC, and signs / symptoms of bleeding  Salome Arnt, PharmD, BCPS Please see AMION for all pharmacy numbers 01/18/2018 12:37 PM

## 2018-01-19 ENCOUNTER — Inpatient Hospital Stay (HOSPITAL_COMMUNITY): Payer: Self-pay

## 2018-01-19 ENCOUNTER — Inpatient Hospital Stay (HOSPITAL_COMMUNITY): Payer: Self-pay | Admitting: Physical Therapy

## 2018-01-19 DIAGNOSIS — R5381 Other malaise: Principal | ICD-10-CM

## 2018-01-19 LAB — COMPREHENSIVE METABOLIC PANEL
ALBUMIN: 2.3 g/dL — AB (ref 3.5–5.0)
ALT: 9 U/L (ref 0–44)
AST: 18 U/L (ref 15–41)
Alkaline Phosphatase: 116 U/L (ref 38–126)
Anion gap: 11 (ref 5–15)
BUN: 33 mg/dL — AB (ref 8–23)
CHLORIDE: 94 mmol/L — AB (ref 98–111)
CO2: 31 mmol/L (ref 22–32)
Calcium: 8.8 mg/dL — ABNORMAL LOW (ref 8.9–10.3)
Creatinine, Ser: 1.1 mg/dL (ref 0.61–1.24)
GFR calc Af Amer: >60 mL/min (ref >60)
GLUCOSE: 97 mg/dL (ref 70–99)
POTASSIUM: 3.4 mmol/L — AB (ref 3.5–5.1)
Sodium: 136 mmol/L (ref 135–145)
Total Bilirubin: 1.3 mg/dL — ABNORMAL HIGH (ref 0.3–1.2)
Total Protein: 6.1 g/dL — ABNORMAL LOW (ref 6.5–8.1)

## 2018-01-19 LAB — CBC WITH DIFFERENTIAL/PLATELET
Abs Immature Granulocytes: 0.03 10*3/uL (ref 0.00–0.07)
BASOS ABS: 0 10*3/uL (ref 0.0–0.1)
BASOS PCT: 1 %
EOS ABS: 0 10*3/uL (ref 0.0–0.5)
EOS PCT: 1 %
HCT: 30.2 % — ABNORMAL LOW (ref 39.0–52.0)
Hemoglobin: 9.1 g/dL — ABNORMAL LOW (ref 13.0–17.0)
IMMATURE GRANULOCYTES: 1 %
LYMPHS PCT: 16 %
Lymphs Abs: 0.6 10*3/uL — ABNORMAL LOW (ref 0.7–4.0)
MCH: 28.3 pg (ref 26.0–34.0)
MCHC: 30.1 g/dL (ref 30.0–36.0)
MCV: 94.1 fL (ref 80.0–100.0)
Monocytes Absolute: 0.3 10*3/uL (ref 0.1–1.0)
Monocytes Relative: 9 %
NRBC: 0 % (ref 0.0–0.2)
Neutro Abs: 2.7 10*3/uL (ref 1.7–7.7)
Neutrophils Relative %: 72 %
PLATELETS: 223 10*3/uL (ref 150–400)
RBC: 3.21 MIL/uL — ABNORMAL LOW (ref 4.22–5.81)
RDW: 19.9 % — ABNORMAL HIGH (ref 11.5–15.5)
WBC: 3.7 10*3/uL — AB (ref 4.0–10.5)

## 2018-01-19 LAB — GLUCOSE, CAPILLARY
GLUCOSE-CAPILLARY: 104 mg/dL — AB (ref 70–99)
GLUCOSE-CAPILLARY: 119 mg/dL — AB (ref 70–99)
GLUCOSE-CAPILLARY: 96 mg/dL (ref 70–99)
Glucose-Capillary: 82 mg/dL (ref 70–99)

## 2018-01-19 LAB — PROTIME-INR
INR: 2.37
PROTHROMBIN TIME: 25.5 s — AB (ref 11.4–15.2)

## 2018-01-19 MED ORDER — WARFARIN SODIUM 7.5 MG PO TABS
7.5000 mg | ORAL_TABLET | Freq: Once | ORAL | Status: AC
  Administered 2018-01-19: 7.5 mg via ORAL
  Filled 2018-01-19: qty 1

## 2018-01-19 NOTE — Progress Notes (Signed)
Subjective. No complaints. Feels well. Eager to start therapy  Obj: BP 118/71 (BP Location: Right Arm)   Pulse 76   Temp 98.4 F (36.9 C) (Oral)   Resp 20   Wt 93.9 kg   SpO2 100%   BMI 26.58 kg/m    elderly, frail male in no acute distress. HEENT exam atraumatic, normocephalic, neck supple without jugular venous distention. Chest clear to auscultation cardiac exam S1-S2 are irregular. 3/6 sem, cardiac click Abdominal exam overweight with bowel sounds, soft and nontender. Neuro: alert  A/p  1. Functional and mobility deficits secondary to left thigh hematoma and debility  -admit to inpatient rehab today  2. DVT Prophylaxis/Anticoagulation: Pharmaceutical: Coumadin  Lab Results  Component Value Date   INR 2.37 01/19/2018   INR 2.10 01/18/2018   INR 1.70 01/17/2018   3. Pain Management: denies pain 4. Mood: LCSW to follow for evaluation and support  5. Neuropsych: This patient is capable of making decisions on his own behalf.  6. Skin/Wound Care: Damp to dry dressing to burn on left thigh to remove fibronecrotic debris.  -Air mattress for pressure relief due to MAST with question blistering. Maintain adequate nutritional status.  7. Fluids/Electrolytes/Nutrition: Basic Metabolic Panel:    Component Value Date/Time   NA 136 01/19/2018 0602   NA 140 07/27/2017 1159   NA 140 01/19/2017 0827   K 3.4 (L) 01/19/2018 0602   K 4.8 01/19/2017 0827   CL 94 (L) 01/19/2018 0602   CO2 31 01/19/2018 0602   CO2 24 01/19/2017 0827   BUN 33 (H) 01/19/2018 0602   BUN 44 (H) 07/27/2017 1159   BUN 28.4 (H) 01/19/2017 0827   CREATININE 1.10 01/19/2018 0602   CREATININE 1.43 (H) 04/20/2017 0943   CREATININE 1.2 01/19/2017 0827   GLUCOSE 97 01/19/2018 0602   GLUCOSE 103 01/19/2017 0827   CALCIUM 8.8 (L) 01/19/2018 0602   CALCIUM 9.7 01/19/2017 0827     8. CAD s/p AVR/CAF/severe MR: On Coumadin, amiodarone and torsemide. Monitor heart rate twice daily  Clinically stable  9. LLE hematoma  with edema: Continue compressive right wraps as well as elevation.  10. T2DM: Monitor blood sugars AC at bedtime  CBG (last 3)  Recent Labs    01/18/18 2105 01/19/18 0654 01/19/18 1154  GLUCAP 104* 104* 119*    11. Chronic diastolic JFH:[id number] stable 12. ABLA: Continue to monitor with serial checks as INR therapeutic now  13. Hypokalemia: We will increase supplement to 3 times daily due to persistent hypokalemia recheck labs in a.m.  14. OSA: cpap

## 2018-01-19 NOTE — Progress Notes (Signed)
[place] for warfarin/lovenox Indication: atrial fibrillation and mechanical aortic valve  Labs: Recent Labs    01/17/18 0228 01/18/18 0224 01/18/18 1230 01/19/18 0602  HGB 8.8* 8.9*  --  9.1*  HCT 28.6* 27.6*  --  30.2*  PLT 210 224  --  223  LABPROT 19.7* 23.3*  --  25.5*  INR 1.70 2.10  --  2.37  CREATININE 1.03 1.08 1.06  --     Estimated Creatinine Clearance: 64.6 mL/min (by C-G formula based on SCr of 1.06 mg/dL).   Assessment: Patient is an 82yoM with history of mechanical aortic valve replacement, permanent afib continue on warfarin with lovenox bridge. Warfarin reversed during hospitalization but now restarted. INR has improved today, but remains subtherapeutic at 2.37. No bleeding noted.   PTA dose: 7.5mg  MWF, 5mg  all other days  Goal of Therapy:  INR 2.5 - 3.5 Monitor platelets by anticoagulation protocol: Yes   Plan:  Continue Lovenox 100mg  SQ BID Warfarin 7.5mg  PO x1 tonight Monitor daily INR and CBC, and signs / symptoms of bleeding  Thank you for allowing pharmacy to be a part of this patient's care.  Leron Croak, PharmD PGY1 Pharmacy Resident Phone: [phone]  Please check AMION for all [place] phone numbers 01/19/2018 8:51 AM

## 2018-01-19 NOTE — Progress Notes (Signed)
Physical Therapy Assessment and Plan  Patient Details  Name: [name] MRN: [id number] Date of Birth: 22-Jun-1935  PT Diagnosis: Abnormality of gait, Difficulty walking, Muscle weakness and Pain in LLE Rehab Potential: Good ELOS: 10-14 days   Today's Date: 01/19/2018 PT Individual Time: 8676-7209 PT Individual Time Calculation (min): 85 min    Problem List:  Patient Active Problem List   Diagnosis Date Noted  . Debility 01/18/2018  . Anemia 01/09/2018  . Leg pain, anterior, left 01/07/2018  . Dysuria 01/07/2018  . Severe anemia 12/23/2017  . Supratherapeutic INR 12/23/2017  . Hyponatremia 12/23/2017  . Coronary artery disease with history of myocardial infarction without history of CABG 12/23/2017  . Bleeding from mouth 12/20/2017  . Post-op pain 12/12/2017  . Dental caries 12/12/2017  . Severe mitral regurgitation 11/14/2017  . Pressure injury of skin 10/29/2017  . Shortness of breath   . Palliative care by specialist   . RVF (right ventricular failure) ([place])   . Right heart failure ([place]) 10/18/2017  . Hypoxia   . COPD exacerbation ([place]) 08/01/2017  . Pancytopenia ([place]) 08/01/2017  . Epistaxis 07/11/2017  . Actinic keratoses 04/05/2017  . Leg wound, right 02/18/2016  . Edema 02/18/2016  . Chronic anticoagulation 02/18/2016  . Hyperkalemia 07/25/2015  . Atherosclerosis of native arteries of extremity with intermittent claudication ([place]) 10/21/2014  . Thrombocytopenia ([place]) 10/20/2014  . Macrocytosis without anemia 10/20/2014  . OSA (obstructive sleep apnea) 05/15/2013  . Goals of care, counseling/discussion 05/01/2013  . History of mechanical aortic valve replacement 01/02/2013  . Obesity (BMI 30-39.9) 10/20/2012  . Type II diabetes mellitus with complication ([place]) 47/12/6281  . Acute asthmatic bronchitis 01/18/2012  . ACUTE KIDNEY FAILURE UNSPECIFIED 02/04/2009  . CUTANEOUS ERUPTIONS, DRUG-INDUCED 02/04/2009  . HLD (hyperlipidemia) 02/03/2009  . Essential  hypertension 02/03/2009  . Coronary atherosclerosis 02/03/2009  . Atrial fibrillation ([place]) 02/03/2009  . Chronic diastolic heart failure ([place]) 02/03/2009  . CAROTID ENDARTERECTOMY, LEFT, HX OF 02/03/2009  . INF&INFLAM REACT DUE CARD DEVICE IMPLANT&GRAFT 12/30/2008    Past Medical History:  Past Medical History:  Diagnosis Date  . Carotid artery disease ([place]) 1994   s/p left carotid endarerectomy   . Chronic atrial fibrillation    a. on coumadin   . Chronic diastolic CHF (congestive heart failure) ([place])   . Diabetes mellitus without complication ([place])    dx 2016  . Dyspnea   . Heart murmur   . Hx of CABG    a. 1994  . Hypertension   . OSA (obstructive sleep apnea)   . Rheumatic fever   . S/P AVR (aortic valve replacement)    a. mechanical valve 1996  . Subclavian bypass stenosis ([place])   . 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 Vitamin B12 deficiency    Past Surgical History:  Past Surgical History:  Procedure Laterality Date  . [place]   replaced due to aortic stenosis, St. Jude mechanical prostesis  . CAROTID ENDARTERECTOMY Left 1997   subclavian bypass Done in [place]  . CORONARY ARTERY BYPASS GRAFT  1994   w SVG to RCA and SVG to circumflex  . heart bypass     Done in [place]  . MULTIPLE EXTRACTIONS WITH ALVEOLOPLASTY Bilateral 12/12/2017   Procedure: Extraction of tooth #'s 1, 12,13,14,15,17,18,19, 29, and 30 with alveoloplasty and gross debridement of remaining teeth`;  Surgeon: Lenn Cal, DDS;  Location: [facility];  Service: Oral  Surgery;  Laterality: Bilateral;  MULTIPLE EXTRACTION WITH ALVEOLOPLASTY WITH PRE PROSTHETIC SURGERY AND GROSS DEBRIDEMENT OF REMAINING TEETH  . TEE WITHOUT CARDIOVERSION N/A 10/23/2017   Procedure: TRANSESOPHAGEAL ECHOCARDIOGRAM (TEE);  Surgeon: Larey Dresser, MD;  Location: [facility] ENDOSCOPY;  Service: Cardiovascular;  Laterality: N/A;  . TONSILLECTOMY       Assessment & Plan Clinical Impression:  [name] is an 82 year old male with history of CAD s/p CABG/St Jude's AVR/CAF- on coumadin, T2DM, OSA, CHF- oxygen dependent with multiple recent admissions for acute on chronic CHF as well as acute blood loss anemia after dental extraction. He was noted to have supratherapeutic INR of 10 on recent check by MD and was treated with oral vitamin K. He was readmitted on 01/09/2018 with hematuria, INR 3.8, Hgb- 5.6 and LLE edema and pain. Work-up revealed intramuscular hematoma in left vastus lateralis 22 cm x 10.8 cm x 6.6 cm and he was treated with 3 units FFP and transfused with 6 total units PRBC. Dr. Doreatha Martin consulted for input and recommended compressive wraps as neurovascular compromise noted and hematoma would likely take weeks to resolve. H&H being monitored and is stable. LLE edema treated with demadex and hypokalemia being supplemented. MASD being monitored. Coumadin resumed with Lovenox bridge on 10/13 with INR goal 2.5-3 range. Patient noted to be deconditioned with functional deficits and CIR recommended for follow-up therapy. Patient transferred to CIR on 01/18/2018 .   Patient currently requires mod with mobility secondary to muscle weakness and decreased standing balance, decreased postural control and decreased balance strategies.  Prior to hospitalization, patient was modified independent  with mobility and lived with Spouse in a House home.  Home access is 1/2 a stepStairs to enter.  Patient will benefit from skilled PT intervention to maximize safe functional mobility, minimize fall risk and decrease caregiver burden for planned discharge home with 24 hour assist.  Anticipate patient will benefit from follow up [facility] at discharge.  PT - End of Session Activity Tolerance: Tolerates 10 - 20 min activity with multiple rests Endurance Deficit: Yes Endurance Deficit Description: requires frequent seated rest breaks, fatigues quickly with upright  mobility PT Assessment Rehab Potential (ACUTE/IP ONLY): Good PT Barriers to Discharge: Medical stability;Home environment access/layout PT Patient demonstrates impairments in the following area(s): Balance;Endurance;Nutrition;Sensory PT Transfers Functional Problem(s): Bed Mobility;Bed to Chair;Car;Furniture;Floor PT Locomotion Functional Problem(s): Ambulation;Stairs PT Plan PT Intensity: Minimum of 1-2 x/day ,45 to 90 minutes PT Frequency: 5 out of 7 days PT Duration Estimated Length of Stay: 10-14 days PT Treatment/Interventions: Ambulation/gait training;Balance/vestibular training;Community reintegration;Discharge planning;Disease management/prevention;DME/adaptive equipment instruction;Functional mobility training;Pain management;Patient/family education;Psychosocial support;Stair training;Therapeutic Activities;Therapeutic Exercise;UE/LE Strength taining/ROM;UE/LE Coordination activities PT Transfers Anticipated Outcome(s): Mod I PT Locomotion Anticipated Outcome(s): Mod I with LRAD PT Recommendation Recommendations for Other Services: Therapeutic Recreation consult Therapeutic Recreation Interventions: Stress management;Outing/community reintergration Follow Up Recommendations: Home health PT Patient destination: Home Equipment Recommended: To be determined Equipment Details: pt owns rollator, TBD if RW would be better  Skilled Therapeutic Intervention Evaluation completed (see details above and below) with education on PT POC and goals and individual treatment initiated with focus on functional transfers and gait assessment. Pt received seated in chair in room, agreeable to PT eval. Pt reports some pain in L quad, not rated and declines intervention. Education with patient about rehab goals, schedule, ELOS, etc. Pt states understanding. Sit to stand with mod A to RW from various height surfaces throughout therapy session due to limited L knee ROM from ACE wrap as well  as pain in L  quad. Pt is able to ambulate up to 36 ft with RW and min A for balance, step-to gait pattern progressing to step-through with antalgic gait and decreased gait speed. Pt on 2L O2 at rest, SpO2 100%, decreases to 96% with gait. Pt performs toilet transfer with mod A with heavy use of BUE on grab bar. Pt is dependent for pericare and clothing management. Sit to supine mod A for BLE management. Pt left semi-reclined in bed with needs in reach and bed alarm in place.   PT Evaluation Precautions/Restrictions Precautions Precautions: Fall Restrictions Other Position/Activity Restrictions: WBAT LLE Home Living/Prior Functioning Home Living Available Help at Discharge: Family;Other (Comment) Type of Home: House Home Access: Stairs to enter [employer] of Steps: 1/2 a step Entrance Stairs-Rails: None Home Layout: Two level;Able to live on main level with bedroom/bathroom Alternate Level Stairs-Number of Steps: stair lift Alternate Level Stairs-Rails: None  Lives With: Spouse Prior Function Level of Independence: Independent with gait;Independent with transfers;Requires assistive device for independence  Able to Take Stairs?: No Driving: No 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 [employer]  Vision/Perception  Vision - History Baseline Vision: Wears glasses all the time Geologist, engineering: Within Functional Limits Praxis Praxis: Intact  Cognition Overall Cognitive Status: Within Functional Limits for tasks assessed Arousal/Alertness: Awake/alert Orientation Level: Oriented X4 Attention: Selective Selective Attention: Appears intact Memory: Appears intact Awareness: Appears intact Problem Solving: Appears intact Safety/Judgment: Appears intact Sensation Sensation Light Touch: Impaired by gross assessment(LLE) Proprioception: Appears Intact Coordination Gross Motor Movements are Fluid and Coordinated: No Fine Motor Movements are Fluid and  Coordinated: No Coordination and Movement Description: impaired by generalized weakness Heel Shin Test: unable to formally assess due to LLE weakness Motor  Motor Motor: Within Functional Limits  Mobility Bed Mobility Bed Mobility: Sit to Supine Sit to Supine: Moderate Assistance - Patient 50-74% Transfers Transfers: Sit to Stand;Stand Pivot Transfers Sit to Stand: Moderate Assistance - Patient 50-74% Stand Pivot Transfers: Moderate Assistance - Patient 50 - 74% Stand Pivot Transfer Details: Manual facilitation for weight shifting;Manual facilitation for placement;Verbal cues for sequencing;Verbal cues for technique;Verbal cues for precautions/safety;Verbal cues for safe use of DME/AE Transfer (Assistive device): Rolling walker Locomotion  Gait Gait Distance (Feet): 36 Feet Assistive device: Rolling walker Gait Gait Pattern: Impaired(antalgic, decreased B step length) Gait velocity: decreased Stairs / Additional Locomotion Stairs: No Wheelchair Mobility Wheelchair Mobility: No  Trunk/Postural Assessment  Cervical Assessment Cervical Assessment: Exceptions to WFL(forward head) Thoracic Assessment Thoracic Assessment: Within Functional Limits Lumbar Assessment Lumbar Assessment: Within Functional Limits Postural Control Postural Control: Deficits on evaluation(delayed)  Balance Balance Balance Assessed: Yes Static Standing Balance Static Standing - Balance Support: Bilateral upper extremity supported;During functional activity Extremity Assessment   RLE Assessment RLE Assessment: Within Functional Limits General Strength Comments: 4+/5 grossly LLE Assessment LLE Assessment: Exceptions to [facility] Passive Range of Motion (PROM) Comments: limited knee flex 2/2 pain and ACE wrap Active Range of Motion (AROM) Comments: limited knee flex 2/2 pain and ACE wrap General Strength Comments: 1/5 hip flex, 3/5 knee flex/ext and DF    Refer to Care Plan for Long Term  Goals  Recommendations for other services: Therapeutic Recreation  Stress management and Outing/community reintegration  Discharge Criteria: Patient will be discharged from PT if patient refuses treatment 3 consecutive times without medical reason, if treatment goals not met, if there is a change in medical status, if patient makes no progress towards goals or if patient is discharged from  hospital.  The above assessment, treatment plan, treatment alternatives and goals were discussed and mutually agreed upon: by patient  Excell Seltzer, PT, DPT  01/19/2018, 4:10 PM

## 2018-01-19 NOTE — Progress Notes (Signed)
Placed pt on home cpap with home settings. Tolerating well.

## 2018-01-19 NOTE — Evaluation (Signed)
Occupational Therapy Assessment and Plan  Patient Details  Name: [name] MRN: [id number] Date of Birth: 1935-07-03  OT Diagnosis: acute pain, muscle weakness (generalized) and decreased coordination; edema LLE Rehab Potential:   ELOS: 10-14   Today's Date: 01/19/2018 OT Individual Time: 1045-1200 OT Individual Time Calculation (min): 75 min     Problem List:  Patient Active Problem List   Diagnosis Date Noted  . Debility 01/18/2018  . Anemia 01/09/2018  . Leg pain, anterior, left 01/07/2018  . Dysuria 01/07/2018  . Severe anemia 12/23/2017  . Supratherapeutic INR 12/23/2017  . Hyponatremia 12/23/2017  . Coronary artery disease with history of myocardial infarction without history of CABG 12/23/2017  . Bleeding from mouth 12/20/2017  . Post-op pain 12/12/2017  . Dental caries 12/12/2017  . Severe mitral regurgitation 11/14/2017  . Pressure injury of skin 10/29/2017  . Shortness of breath   . Palliative care by specialist   . RVF (right ventricular failure) ([place])   . Right heart failure ([place]) 10/18/2017  . Hypoxia   . COPD exacerbation ([place]) 08/01/2017  . Pancytopenia ([place]) 08/01/2017  . Epistaxis 07/11/2017  . Actinic keratoses 04/05/2017  . Leg wound, right 02/18/2016  . Edema 02/18/2016  . Chronic anticoagulation 02/18/2016  . Hyperkalemia 07/25/2015  . Atherosclerosis of native arteries of extremity with intermittent claudication ([place]) 10/21/2014  . Thrombocytopenia ([place]) 10/20/2014  . Macrocytosis without anemia 10/20/2014  . OSA (obstructive sleep apnea) 05/15/2013  . Goals of care, counseling/discussion 05/01/2013  . History of mechanical aortic valve replacement 01/02/2013  . Obesity (BMI 30-39.9) 10/20/2012  . Type II diabetes mellitus with complication ([place]) 40/98/1191  . Acute asthmatic bronchitis 01/18/2012  . ACUTE KIDNEY FAILURE UNSPECIFIED 02/04/2009  . CUTANEOUS ERUPTIONS, DRUG-INDUCED 02/04/2009  . HLD (hyperlipidemia) 02/03/2009  . Essential  hypertension 02/03/2009  . Coronary atherosclerosis 02/03/2009  . Atrial fibrillation ([place]) 02/03/2009  . Chronic diastolic heart failure ([place]) 02/03/2009  . CAROTID ENDARTERECTOMY, LEFT, HX OF 02/03/2009  . INF&INFLAM REACT DUE CARD DEVICE IMPLANT&GRAFT 12/30/2008    Past Medical History:  Past Medical History:  Diagnosis Date  . Carotid artery disease ([place]) 1994   s/p left carotid endarerectomy   . Chronic atrial fibrillation    a. on coumadin   . Chronic diastolic CHF (congestive heart failure) ([place])   . Diabetes mellitus without complication ([place])    dx 2016  . Dyspnea   . Heart murmur   . Hx of CABG    a. 1994  . Hypertension   . OSA (obstructive sleep apnea)   . Rheumatic fever   . S/P AVR (aortic valve replacement)    a. mechanical valve 1996  . Subclavian bypass stenosis ([place])   . 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 Vitamin B12 deficiency    Past Surgical History:  Past Surgical History:  Procedure Laterality Date  . [place]   replaced due to aortic stenosis, St. Jude mechanical prostesis  . CAROTID ENDARTERECTOMY Left 1997   subclavian bypass Done in [place]  . CORONARY ARTERY BYPASS GRAFT  1994   w SVG to RCA and SVG to circumflex  . heart bypass     Done in [place]  . MULTIPLE EXTRACTIONS WITH ALVEOLOPLASTY Bilateral 12/12/2017   Procedure: Extraction of tooth #'s 1, 12,13,14,15,17,18,19, 29, and 30 with alveoloplasty and gross debridement of remaining teeth`;  Surgeon: Lenn Cal, DDS;  Location: [facility];  Service: Oral  Surgery;  Laterality: Bilateral;  MULTIPLE EXTRACTION WITH ALVEOLOPLASTY WITH PRE PROSTHETIC SURGERY AND GROSS DEBRIDEMENT OF REMAINING TEETH  . TEE WITHOUT CARDIOVERSION N/A 10/23/2017   Procedure: TRANSESOPHAGEAL ECHOCARDIOGRAM (TEE);  Surgeon: Larey Dresser, MD;  Location[facility] ENDOSCOPY;  Service: Cardiovascular;  Laterality: N/A;  . TONSILLECTOMY       Assessment & Plan Clinical Impression:  82 y.o. M w/ a hx of mechanical aortic valve (1994), OSA on CPAP, permanent A. fib, chronic anticoagulant therapy with Coumadin, chronic combined CHF, CKD stage III, DM, CAD with CABG 1994, COPD, severe mitral regurgitation, carotid stenosis with left carotid enterotomy, HTN, and HLD who was admitted with anemia to 5.6 due to left leg hematoma.    Patient currently requires min- max with basic self-care skills secondary to muscle weakness, decreased cardiorespiratoy endurance and decreased sitting balance, decreased standing balance, decreased postural control and decreased balance strategies.  Prior to hospitalization, patient could complete BADL with modified independent .  Patient will benefit from skilled intervention to decrease level of assist with basic self-care skills and increase independence with basic self-care skills prior to discharge home with care partner.  Anticipate patient will require 24 hour supervision and follow up home health.  OT - End of Session Activity Tolerance: Tolerates 30+ min activity with multiple rests Endurance Deficit: Yes OT Assessment Rehab Potential (ACUTE ONLY): Good OT Patient demonstrates impairments in the following area(s): Balance;Edema;Pain;Safety OT Basic ADL's Functional Problem(s): Grooming;Bathing;Dressing;Toileting OT Transfers Functional Problem(s): Toilet;Tub/Shower OT Additional Impairment(s): None OT Plan OT Intensity: Minimum of 1-2 x/day, 45 to 90 minutes OT Frequency: 5 out of 7 days OT Duration/Estimated Length of Stay: 10-14 OT Treatment/Interventions: Balance/vestibular training;Discharge planning;Pain management;Self Care/advanced ADL retraining;Therapeutic Activities;UE/LE Coordination activities;Cognitive remediation/compensation;Disease mangement/prevention;Functional mobility training;Patient/family education;Skin care/wound managment;Therapeutic Exercise;Community  reintegration;DME/adaptive equipment instruction;Neuromuscular re-education;Psychosocial support;Splinting/orthotics;UE/LE Strength taining/ROM;Wheelchair propulsion/positioning OT Self Feeding Anticipated Outcome(s): no goal OT Basic Self-Care Anticipated Outcome(s): MOD I OT Toileting Anticipated Outcome(s): MOD I OT Bathroom Transfers Anticipated Outcome(s): MOD I toileting; S shower OT Recommendation Patient destination: Home Follow Up Recommendations: Home health OT Equipment Recommended: Tub/shower seat;To be determined Equipment Details: likely none   Skilled Therapeutic Intervention 1;1. Pt received seated in bed educated on role/pupose of OT, CIR, ELOS and POC. Pt completes supine>sitting with MOD A for LLE management and trunk elevation. Pt requires initally BUE for sitting balance EOB. Pt sit to stand and stand pivot transfer EOB<>BSC/standard chair from elevated bed surface with MOD lifting A. Once on feet pt only require CGA for pivot or balance during clothing management. Pt completes toileting with total A for all components of toileting. Pt completes bathing EOB with supervision for sitting balance while washing UB and pt requires A for buttock hygiene and washing BLE. Pt uses LHSS at home to wash B feet. Pt completes UB dressing with set up and LB dressing with total A for incontinence prief, A to thread LLE into pants and advance both past hips. Pt completes oral care seated with set up seated in standard chair. Foot elevated on trashcan, pt seated in chair, call light in reach and NT in room as OT exits.   OT Evaluation Precautions/Restrictions  Precautions Precautions: Fall Restrictions Weight Bearing Restrictions: No Other Position/Activity Restrictions: WBAT LLE General   Vital Signs   Pain Pain Assessment Pain Scale: 0-10 Pain Score: 1 (1 at rest; 4-5 with mobility) Pain Orientation: Left Pain Intervention(s): Refused(declines pain medication, therapy to  tolerance) Home Living/Prior Functioning Home Living Family/patient expects to be discharged to:: Private  residence Living Arrangements: Spouse/significant other Available Help at Discharge: Family, Other (Comment) Type of Home: House Home Access: Stairs to enter [employer] of Steps: 1/2 a step Home Layout: Two level, Able to live on main level with bedroom/bathroom Alternate Level Stairs-Number of Steps: stair lift Bathroom Shower/Tub: Multimedia programmer: Standard Bathroom Accessibility: Yes Additional Comments: Pt reports using rollator prior to entering the hospital IADL History Homemaking Responsibilities: Yes Homemaking Comments: would like to be able to drive and take long walks with rolator again Prior Function Level of Independence: Independent with basic ADLs, Independent with homemaking with ambulation  Able to Take Stairs?: No 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 [employer]  ADL ADL Eating: Independent Grooming: Setup Where Assessed-Grooming: Chair Upper Body Bathing: Setup Where Assessed-Upper Body Bathing: Edge of bed Lower Body Bathing: Maximal assistance Where Assessed-Lower Body Bathing: Edge of bed Upper Body Dressing: Setup Where Assessed-Upper Body Dressing: Edge of bed Lower Body Dressing: Maximal assistance Where Assessed-Lower Body Dressing: Edge of bed Toileting: Dependent Where Assessed-Toileting: Bedside Commode Toilet Transfer: Moderate assistance Toilet Transfer Method: Stand pivot Toilet Transfer Equipment: Bedside commode, Other (comment)(RW) Tub/Shower Transfer: Not assessed Vision Wears Glasses: At all times Patient Visual Report: No change from baseline Vision Assessment?: No apparent visual deficits Perception  Perception: Within Functional Limits Praxis Praxis: Intact Cognition Overall Cognitive Status: Within Functional Limits for tasks assessed Year: 2019 Month: October Day of  Week: Correct Memory: Appears intact Immediate Memory Recall: Sock;Blue;Bed Memory Recall: Sock;Blue Memory Recall Sock: Without Cue Memory Recall Blue: Without Cue Awareness: Appears intact Problem Solving: Appears intact Safety/Judgment: Appears intact Sensation Sensation Light Touch: Impaired by gross assessment(LLE) Hot/Cold: Not tested Proprioception: Not tested Coordination Gross Motor Movements are Fluid and Coordinated: No Fine Motor Movements are Fluid and Coordinated: No Motor    Mobility     Trunk/Postural Assessment  Cervical Assessment Cervical Assessment: Within Functional Limits Thoracic Assessment Thoracic Assessment: Within Functional Limits Lumbar Assessment Lumbar Assessment: Within Functional Limits Postural Control Postural Control: Deficits on evaluation(delayed/insuffucient)  Balance Balance Balance Assessed: Yes Dynamic Sitting Balance Sitting balance - Comments: Pt required UE to maintain sitting posture EOB. Pt required assist initally for supine to sitting balance and then was able to sit with support from his UE.  Static Standing Balance Static Standing - Level of Assistance: 4: Min assist Extremity/Trunk Assessment RUE Assessment RUE Assessment: Within Functional Limits LUE Assessment LUE Assessment: Within Functional Limits     Refer to Care Plan for Long Term Goals  Recommendations for other services: Therapeutic Recreation  Pet therapy   Discharge Criteria: Patient will be discharged from OT if patient refuses treatment 3 consecutive times without medical reason, if treatment goals not met, if there is a change in medical status, if patient makes no progress towards goals or if patient is discharged from hospital.  The above assessment, treatment plan, treatment alternatives and goals were discussed and mutually agreed upon: by patient  Tonny Branch 01/19/2018, 11:11 AM

## 2018-01-19 NOTE — Progress Notes (Signed)
Occupational Therapy Session Note  Patient Details  Name: [name] MRN: [id number] Date of Birth: [birth year]  Today's Date: 01/19/2018 OT Individual Time: 4458-4835 OT Individual Time Calculation (min): 50 min    Short Term Goals: Week 1:  OT Short Term Goal 1 (Week 1): Pt will sit to stand wiht min A in prep for LB dressing OT Short Term Goal 2 (Week 1): Pt will complete BSC/toilet transfer wiht Min A OT Short Term Goal 3 (Week 1): Pt will thread BLE into pants with AE PRN and supervision OT Short Term Goal 4 (Week 1): Pt will complete clothing managment prior to toileting with CGA  Skilled Therapeutic Interventions/Progress Updates:  1:1.pt with no c/o pain. Pt supine>sitting minA for trunk elevation. Pt completes stand pivot transfer EOB>w/c with steady A and boost for sit to stand portion and RW. Ot and pt discuss home shower set up and OT demo posterior method to enter shower. Pt able to return demo with touching A during step over 4" shower ledge but mod A for sit to stand power up from w/c and seat inside shower. OT and pt agree BSC in shower may be best option to allow for arm rests to push off of when exiting shower. Pt propels w/c part way back to room and transfers back to bed as stated above. Exited sessidon with pt seated in bed, call ligth in reachand all needs met Therapy Documentation Precautions:  Precautions Precautions: Fall Restrictions Weight Bearing Restrictions: No Other Position/Activity Restrictions: WBAT LLE General:   Vital Signs: Therapy Vitals Temp: 98.4 F (36.9 C) Pulse Rate: 74 Resp: 17 BP: (!) 93/52 Patient Position (if appropriate): Lying Oxygen Therapy SpO2: 100 % O2 Device: Room Air Pain: Pain Assessment Pain Score: 0-No pain ADL: ADL Eating: Independent Grooming: Setup Where Assessed-Grooming: Chair Upper Body Bathing: Setup Where Assessed-Upper Body Bathing: Edge of bed Lower Body Bathing: Maximal assistance Where  Assessed-Lower Body Bathing: Edge of bed Upper Body Dressing: Setup Where Assessed-Upper Body Dressing: Edge of bed Lower Body Dressing: Maximal assistance Where Assessed-Lower Body Dressing: Edge of bed Toileting: Dependent Where Assessed-Toileting: Bedside Commode Toilet Transfer: Moderate assistance Toilet Transfer Method: Stand pivot Toilet Transfer Equipment: Bedside commode, Other (comment)(RW) Tub/Shower Transfer: Not assessed Vision   Perception  Perception: Within Functional Limits Praxis Praxis: Intact Exercises:   Other Treatments:     Therapy/Group: Individual Therapy  Tonny Branch 01/19/2018, 3:16 PM

## 2018-01-19 NOTE — Plan of Care (Signed)
  Problem: Consults Goal: RH GENERAL PATIENT EDUCATION Description See Patient Education module for education specifics. Outcome: Progressing Goal: Skin Care Protocol Initiated - if Braden Score 18 or less Description If consults are not indicated, leave blank or document N/A Outcome: Progressing Goal: Nutrition Consult-if indicated Outcome: Progressing Goal: Diabetes Guidelines if Diabetic/Glucose > 140 Description If diabetic or lab glucose is > 140 mg/dl - Initiate Diabetes/Hyperglycemia Guidelines & Document Interventions  Outcome: Progressing   Problem: RH BOWEL ELIMINATION Goal: RH STG MANAGE BOWEL WITH ASSISTANCE Description STG Manage Bowel with min Assistance.  Outcome: Progressing Goal: RH STG MANAGE BOWEL W/MEDICATION W/ASSISTANCE Description STG Manage Bowel with Medication with min Assistance.  Outcome: Progressing   Problem: RH SKIN INTEGRITY Goal: RH STG SKIN FREE OF INFECTION/BREAKDOWN Outcome: Progressing Goal: RH STG MAINTAIN SKIN INTEGRITY WITH ASSISTANCE Description STG Maintain Skin Integrity With min  Assistance.  Outcome: Progressing Goal: RH STG ABLE TO PERFORM INCISION/WOUND CARE W/ASSISTANCE Description STG Able To Perform Incision/Wound Care With min Assistance.  Outcome: Progressing   Problem: RH SAFETY Goal: RH STG ADHERE TO SAFETY PRECAUTIONS W/ASSISTANCE/DEVICE Description STG Adhere to Safety Precautions With Assistance/Device. Outcome: Progressing Goal: RH STG DECREASED RISK OF FALL WITH ASSISTANCE Description STG Decreased Risk of Fall With Assistance. Outcome: Progressing   Problem: RH PAIN MANAGEMENT Goal: RH STG PAIN MANAGED AT OR BELOW PT'S PAIN GOAL Outcome: Progressing   Problem: RH KNOWLEDGE DEFICIT GENERAL Goal: RH STG INCREASE KNOWLEDGE OF SELF CARE AFTER HOSPITALIZATION Outcome: Progressing   Problem: RH BLADDER ELIMINATION Goal: RH STG MANAGE BLADDER WITH ASSISTANCE Description STG Manage Bladder With min   Assistance  Outcome: Not Progressing Goal: RH STG MANAGE BLADDER WITH MEDICATION WITH ASSISTANCE Description STG Manage Bladder With Medication With min Assistance.  Outcome: Not Progressing Goal: RH STG MANAGE BLADDER WITH EQUIPMENT WITH ASSISTANCE Description STG Manage Bladder With Equipment With min Assistance  Outcome: Not Progressing

## 2018-01-20 ENCOUNTER — Inpatient Hospital Stay (HOSPITAL_COMMUNITY): Payer: Self-pay

## 2018-01-20 LAB — GLUCOSE, CAPILLARY
Glucose-Capillary: 101 mg/dL — ABNORMAL HIGH (ref 70–99)
Glucose-Capillary: 86 mg/dL (ref 70–99)
Glucose-Capillary: 86 mg/dL (ref 70–99)
Glucose-Capillary: 102 mg/dL — ABNORMAL HIGH (ref 70–99)

## 2018-01-20 LAB — PROTIME-INR
INR: 2.66
PROTHROMBIN TIME: 28 s — AB (ref 11.4–15.2)

## 2018-01-20 MED ORDER — WARFARIN SODIUM 5 MG PO TABS
5.0000 mg | ORAL_TABLET | Freq: Once | ORAL | Status: AC
  Administered 2018-01-20: 5 mg via ORAL
  Filled 2018-01-20: qty 1

## 2018-01-20 MED ORDER — WARFARIN SODIUM 7.5 MG PO TABS: 7.5000 mg | ORAL_TABLET | Freq: Once | ORAL | Status: DC

## 2018-01-20 MED ORDER — ENOXAPARIN SODIUM 100 MG/ML ~~LOC~~ SOLN: 1.0000 mg/kg | Freq: Two times a day (BID) | SUBCUTANEOUS | Status: DC

## 2018-01-20 NOTE — Progress Notes (Signed)
Occupational Therapy Session Note  Patient Details  Name: [name] MRN: [id number] Date of Birth: 11-Aug-1935  Today's Date: 01/20/2018 OT Individual Time: 0700-0800 OT Individual Time Calculation (min): 60 min    Short Term Goals: Week 1:  OT Short Term Goal 1 (Week 1): Pt will sit to stand wiht min A in prep for LB dressing OT Short Term Goal 2 (Week 1): Pt will complete BSC/toilet transfer wiht Min A OT Short Term Goal 3 (Week 1): Pt will thread BLE into pants with AE PRN and supervision OT Short Term Goal 4 (Week 1): Pt will complete clothing managment prior to toileting with CGA  Skilled Therapeutic Interventions/Progress Updates:    OT intervention with focus on bed mobility, sit<>stand, standing balance, functional amb with RW, toileting, activity tolerance, and safety awareness to increase independence with BADLs. Pt resting in bed upon arrival and required min A for supine>sit in preparation for amb with RW to w/c at sink for bathing/dressing tasks.  Pt requires min A for sit<>stand but maintains standing balance with supervision.  Pt required min A for LB dressing tasks without use of reacher (pt states he has several reachers at home). Pt amb with RW to bathroom to use toilet.  Amb with steady A.  Pt returned to room to complete dressing tasks.  Pt requires more than a reasonable amount of time to complete tasks. Pt remained in w/c with all needs within reach and chair alarm activated.   Therapy Documentation Precautions:  Precautions Precautions: Fall Restrictions Weight Bearing Restrictions: No Other Position/Activity Restrictions: WBAT LLE   Pain:  Pt denies pain   Therapy/Group: Individual Therapy  [name] 01/20/2018, 9:01 AM

## 2018-01-20 NOTE — Progress Notes (Signed)
Patient placed on home CPAP at this time, no distress noted at this time.

## 2018-01-20 NOTE — Progress Notes (Addendum)
[facility] for warfarin/lovenox Indication: atrial fibrillation and mechanical aortic valve  Labs: Recent Labs    01/18/18 0224 01/18/18 1230 01/19/18 0602 01/20/18 0614  HGB 8.9*  --  9.1*  --   HCT 27.6*  --  30.2*  --   PLT 224  --  223  --   LABPROT 23.3*  --  25.5* 28.0*  INR 2.10  --  2.37 2.66  CREATININE 1.08 1.06 1.10  --     Estimated Creatinine Clearance: 62.3 mL/min (by C-G formula based on SCr of 1.1 mg/dL).   Assessment: Patient is an 76yoM with history of mechanical aortic valve replacement, permanent afib continue on warfarin with lovenox bridge. Today is day 4 of lovenox overlap. Warfarin reversed during hospitalization but now restarted. INR has improved today, therapeutic at 2.66. Suspect that therapeutic INR is reflecting 10mg  warfarin dose given on 10/17 and may continue to increase in the next few days prior to normalizing. Of note, nurse reported that patient has had some bleeding from gums and appearance of bruising on arms today.  PTA dose: 7.5mg  MWF, 5mg  all other days  Goal of Therapy:  INR 2.5 - 3.5 Monitor platelets by anticoagulation protocol: Yes   Plan:  Warfarin 5mg  PO x 1 tonight Discontinue lovenox overlap since INR is now therapeutic Monitor daily INR and CBC, and signs / symptoms of bleeding  Thank you for allowing pharmacy to be a part of this patient's care.  Leron Croak, PharmD PGY1 Pharmacy Resident Phone: [phone]  Please check AMION for all [facility] phone numbers 01/20/2018 12:36 PM

## 2018-01-20 NOTE — Progress Notes (Signed)
Occupational Therapy Session Note  Patient Details  Name: [name] MRN: [id number] Date of Birth: 1935/08/13  Today's Date: 01/20/2018 OT Individual Time: 1100-1157 OT Individual Time Calculation (min): 57 min    Short Term Goals: Week 1:  OT Short Term Goal 1 (Week 1): Pt will sit to stand wiht min A in prep for LB dressing OT Short Term Goal 2 (Week 1): Pt will complete BSC/toilet transfer wiht Min A OT Short Term Goal 3 (Week 1): Pt will thread BLE into pants with AE PRN and supervision OT Short Term Goal 4 (Week 1): Pt will complete clothing managment prior to toileting with CGA  Skilled Therapeutic Interventions/Progress Updates:    1;1. Pt received seated in w/c with pain being "the usual" and not wanting medical intervention at this time. Pt requesting to work on walking. Pt completes ambualtion in tx gym to high low table on RA with 94% O2 after ambulation. Pt requires CGA for sit to stand this date and VC for scooting to edge of chair. Pt completes standing tolerance activity while playing connect 4 at high low table with supervision for static standing. Pt able to stand ~4 min prior to reporting need to have BM. Pt completes ambulatory transfer to [facility] over toilet and has large BM with increased time. Pt able to complete clothing management with CGA and requires A for thorough posterior hygiene. Pt O2 after standing hand washing and toileting 89 but able to increase to 95 on RA with pursed lip breathing. Pt propels w/c part way to gym with supervision. Pt completes ball toss seated in w/c for BUE coordination and strengthening 3x30 passes (overhead, chest and bounce) with supervision. Pt demo decreased shoulder AROM 0-80 for flexion and abduction. No pain with PROM 0-110 soft tissue tightness at end range noted. Pt propels part way backto room, and OT exits with call light in reach exit alarm on and all needs met  Therapy Documentation Precautions:  Precautions Precautions:  Fall Restrictions Weight Bearing Restrictions: No Other Position/Activity Restrictions: WBAT LLE   Therapy/Group: Individual Therapy  Tonny Branch 01/20/2018, 11:22 AM

## 2018-01-20 NOTE — Progress Notes (Signed)
Placed pt on home CPAP with home settings and 2L O2 bled through. Added H2O

## 2018-01-20 NOTE — Progress Notes (Signed)
Subjective.  Patient feels well.  Patient is sitting up getting ready for breakfast.  Therapy is working with him in the room.  Obj: BP 94/60 (BP Location: Left Arm)   Pulse 85   Temp 97.8 F (36.6 C)   Resp 16   Wt 97.1 kg   SpO2 96%   BMI 27.48 kg/m    elderly, frail male in no acute distress. HEENT exam atraumatic, normocephalic, neck supple without jugular venous distention. Chest clear to auscultation cardiac exam S1-S2 are irregular. 3/6 sem, cardiac click Abdominal exam overweight with bowel sounds, soft and nontender. Neuro: alert Extremities.  Left lower extremity is wrapped.  Right lower extremity with discoloration distally.  A/p S Dr. Wynetta Emery 1. Functional and mobility deficits secondary to left thigh hematoma and debility  -admit to inpatient rehab today  2. DVT Prophylaxis/Anticoagulation: Pharmaceutical: Coumadin  Lab Results  Component Value Date   INR 2.66 01/20/2018   INR 2.37 01/19/2018   INR 2.10 01/18/2018   3. Pain Management: denies pain 4. Mood: LCSW to follow for evaluation and support  5. Neuropsych: This patient is capable of making decisions on his own behalf.  6. Skin/Wound Care: Damp to dry dressing to burn on left thigh to remove fibronecrotic debris.  -Air mattress for pressure relief due to MAST with question blistering. Maintain adequate nutritional status.  7. Fluids/Electrolytes/Nutrition: Basic Metabolic Panel:    Component Value Date/Time   NA 136 01/19/2018 0602   NA 140 07/27/2017 1159   NA 140 01/19/2017 0827   K 3.4 (L) 01/19/2018 0602   K 4.8 01/19/2017 0827   CL 94 (L) 01/19/2018 0602   CO2 31 01/19/2018 0602   CO2 24 01/19/2017 0827   BUN 33 (H) 01/19/2018 0602   BUN 44 (H) 07/27/2017 1159   BUN 28.4 (H) 01/19/2017 0827   CREATININE 1.10 01/19/2018 0602   CREATININE 1.43 (H) 04/20/2017 0943   CREATININE 1.2 01/19/2017 0827   GLUCOSE 97 01/19/2018 0602   GLUCOSE 103 01/19/2017 0827   CALCIUM 8.8 (L) 01/19/2018 0602   CALCIUM 9.7 01/19/2017 0827     8. CAD s/p AVR/CAF/severe MR: On Coumadin, amiodarone and torsemide. Monitor heart rate twice daily  Clinically stable  9. LLE hematoma with edema: Continue compressive wraps as well as elevation.  10. T2DM: Monitor blood sugars AC at bedtime  CBG (last 3)  Recent Labs    01/19/18 1648 01/19/18 2123 01/20/18 0633  GLUCAP 82 96 101*    11. Chronic diastolic FWY:OVZCHYIFOY stable 12. ABLA:  Lab Results  Component Value Date   HGB 9.1 (L) 01/19/2018    13. Hypokalemia: improving Lab Results  Component Value Date   K 3.4 (L) 01/19/2018    14. OSA: cpap

## 2018-01-20 NOTE — Progress Notes (Signed)
Nurse tech alerted that when she went to do the patients vitals, his right arm was bleeding. When this nurse went into the room, patient had what looked like an abrasion to the right lateral antecubital area. Area was cleansed with normal saline, pressure applied & a new foam drsg applied with a 2 x 2 under it. No acute distress noted.Will continue to monitor

## 2018-01-21 ENCOUNTER — Inpatient Hospital Stay (HOSPITAL_COMMUNITY): Payer: Self-pay

## 2018-01-21 ENCOUNTER — Inpatient Hospital Stay (HOSPITAL_COMMUNITY): Payer: Self-pay | Admitting: Occupational Therapy

## 2018-01-21 ENCOUNTER — Encounter: Payer: Medicare Other | Admitting: Thoracic Surgery (Cardiothoracic Vascular Surgery)

## 2018-01-21 DIAGNOSIS — I5030 Unspecified diastolic (congestive) heart failure: Secondary | ICD-10-CM

## 2018-01-21 LAB — BASIC METABOLIC PANEL
Anion gap: 8 (ref 5–15)
BUN: 37 mg/dL — ABNORMAL HIGH (ref 8–23)
CALCIUM: 9 mg/dL (ref 8.9–10.3)
CO2: 32 mmol/L (ref 22–32)
CREATININE: 1.26 mg/dL — AB (ref 0.61–1.24)
Chloride: 95 mmol/L — ABNORMAL LOW (ref 98–111)
GFR, EST NON AFRICAN AMERICAN: 52 mL/min — AB (ref >60)
Glucose, Bld: 112 mg/dL — ABNORMAL HIGH (ref 70–99)
Potassium: 4 mmol/L (ref 3.5–5.1)
Sodium: 135 mmol/L (ref 135–145)

## 2018-01-21 LAB — GLUCOSE, CAPILLARY
Glucose-Capillary: 97 mg/dL (ref 70–99)
Glucose-Capillary: 92 mg/dL (ref 70–99)
Glucose-Capillary: 97 mg/dL (ref 70–99)
Glucose-Capillary: 119 mg/dL — ABNORMAL HIGH (ref 70–99)

## 2018-01-21 LAB — PROTIME-INR
INR: 2.64
Prothrombin Time: 27.8 seconds — ABNORMAL HIGH (ref 11.4–15.2)

## 2018-01-21 MED ORDER — PRO-STAT SUGAR FREE PO LIQD
30.0000 mL | Freq: Two times a day (BID) | ORAL | Status: DC
  Administered 2018-01-21 – 2018-01-31 (×21): 30 mL via ORAL
  Filled 2018-01-21 (×21): qty 30

## 2018-01-21 MED ORDER — TORSEMIDE 20 MG PO TABS
50.0000 mg | ORAL_TABLET | Freq: Two times a day (BID) | ORAL | Status: DC
  Administered 2018-01-21 – 2018-01-28 (×14): 50 mg via ORAL
  Filled 2018-01-21 (×15): qty 1

## 2018-01-21 MED ORDER — POTASSIUM CHLORIDE CRYS ER 20 MEQ PO TBCR
40.0000 meq | EXTENDED_RELEASE_TABLET | Freq: Two times a day (BID) | ORAL | Status: DC
  Administered 2018-01-21 (×2): 40 meq via ORAL
  Filled 2018-01-21 (×2): qty 2

## 2018-01-21 NOTE — Progress Notes (Signed)
Water chamber filled per pt request, pt placed on home CPAP at this time with home nasal mask, home tubing, and 2L O2 bled in. Pt tolerating at this time. Advised pt to notify for RT if any further assistance is required.

## 2018-01-21 NOTE — IPOC Note (Signed)
Overall Plan of Care [facility]) Patient Details Name: [name] MRN: [id number] DOB: 1935-06-29  Admitting Diagnosis: <principal problem not specified>  Hospital Problems: Active Problems:   Debility     Functional Problem List: Nursing    PT Balance, Endurance, Nutrition, Sensory  OT Balance, Edema, Pain, Safety  SLP    TR         Basic ADL's: OT Grooming, Bathing, Dressing, Toileting     Advanced  ADL's: OT       Transfers: PT Bed Mobility, Bed to Chair, Car, [employer], Floor  OT Toilet, Metallurgist: PT Ambulation, Stairs     Additional Impairments: OT None  SLP        TR      Anticipated Outcomes Item Anticipated Outcome  Self Feeding no goal  Swallowing      Basic self-care  MOD I  Toileting  MOD I   Bathroom Transfers MOD I toileting; S shower  Bowel/Bladder     Transfers  Mod I  Locomotion  Mod I with LRAD  Communication     Cognition     Pain     Safety/Judgment      Therapy Plan: PT Intensity: Minimum of 1-2 x/day ,45 to 90 minutes PT Frequency: 5 out of 7 days PT Duration Estimated Length of Stay: 10-14 days OT Intensity: Minimum of 1-2 x/day, 45 to 90 minutes OT Frequency: 5 out of 7 days OT Duration/Estimated Length of Stay: 10-14      Team Interventions: Nursing Interventions    PT interventions Ambulation/gait training, Training and development officer, Academic librarian, Discharge planning, Disease management/prevention, Engineer, drilling, Functional mobility training, Pain management, Patient/family education, Psychosocial support, Stair training, Therapeutic Activities, Therapeutic Exercise, UE/LE Strength taining/ROM, UE/LE Coordination activities  OT Interventions Balance/vestibular training, Discharge planning, Pain management, Self Care/advanced ADL retraining, Therapeutic Activities, UE/LE Coordination activities, Cognitive remediation/compensation, Disease mangement/prevention,  Functional mobility training, Patient/family education, Skin care/wound managment, Therapeutic Exercise, Community reintegration, Engineer, drilling, Neuromuscular re-education, Psychosocial support, Splinting/orthotics, UE/LE Strength taining/ROM, Wheelchair propulsion/positioning  SLP Interventions    TR Interventions    SW/CM Interventions Discharge Planning, Psychosocial Support, Patient/Family Education   Barriers to Discharge MD  Medical stability and Wound care  Nursing      PT Medical stability, Home environment access/layout    OT      SLP      SW       Team Discharge Planning: Destination: PT-Home ,OT- Home , SLP-  Projected Follow-up: PT-Home health PT, OT-  Home health OT, SLP-  Projected Equipment Needs: PT-To be determined, OT- Tub/shower seat, To be determined, SLP-  Equipment Details: PT-pt owns rollator, TBD if RW would be better, OT-likely none Patient/family involved in discharge planning: PT- Patient,  OT-Patient, SLP-   MD ELOS: 10-14d Medical Rehab Prognosis:  Good Assessment:  82 year old male with history of CAD s/p CABG/[employer] AVR/CAF- on coumadin, T2DM, OSA, CHF- oxygen dependent with multiple recent admissions for acute on chronic CHF as well as acute blood loss anemia after dental extraction. He was noted to have supratherapeutic INR of 10 on recent check by MD and was treated with oral vitamin K. He was readmitted on 01/09/2018 with hematuria, INR 3.8, Hgb- 5.6 and LLE edema and pain. Work-up revealed intramuscular hematoma in left vastus lateralis 22 cm x 10.8 cm x 6.6 cm and he was treated with 3 units FFP and transfused with 6 total units PRBC. Dr. Doreatha Martin consulted for input  and recommended compressive wraps as neurovascular compromise noted and hematoma would likely take weeks to resolve. H&H being monitored and is stable. LLE edema treated with demadex and hypokalemia being supplemented. MASD being monitored. Coumadin resumed with  Lovenox bridge on 10/13 with INR goal 2.5-3 range. Patient noted to be deconditioned with functional deficits and CIR recommended for follow-up therapy    Now requiring 24/7 Rehab RN,MD, as well as CIR level PT, OT.  Treatment team will focus on ADLs and mobility with goals set at Mod I See Team Conference Notes for weekly updates to the plan of care

## 2018-01-21 NOTE — Progress Notes (Signed)
Patient information reviewed and entered into eRehab system by Daiva Nakayama, RN, CRRN, [place] Coordinator.  Information including medical coding, functional ability and quality indicators will be reviewed and updated through discharge.     Per nursing patient was given "Data Collection Information Summary for Patients in Inpatient Rehabilitation Facilities with attached "Privacy Act [place] Records" upon admission.  Present in education notebook.

## 2018-01-21 NOTE — Progress Notes (Signed)
Social Work  Social Work Assessment and Plan  Patient Details  Name: [name] MRN: [id number] Date of Birth: 05/21/1935  Today's Date: 01/21/2018  Problem List:  Patient Active Problem List   Diagnosis Date Noted  . Debility 01/18/2018  . Anemia 01/09/2018  . Leg pain, anterior, left 01/07/2018  . Dysuria 01/07/2018  . Severe anemia 12/23/2017  . Supratherapeutic INR 12/23/2017  . Hyponatremia 12/23/2017  . Coronary artery disease with history of myocardial infarction without history of CABG 12/23/2017  . Bleeding from mouth 12/20/2017  . Post-op pain 12/12/2017  . Dental caries 12/12/2017  . Severe mitral regurgitation 11/14/2017  . Pressure injury of skin 10/29/2017  . Shortness of breath   . Palliative care by specialist   . RVF (right ventricular failure) ([place])   . Right heart failure ([place]) 10/18/2017  . Hypoxia   . COPD exacerbation ([place]) 08/01/2017  . Pancytopenia ([place]) 08/01/2017  . Epistaxis 07/11/2017  . Actinic keratoses 04/05/2017  . Leg wound, right 02/18/2016  . Edema 02/18/2016  . Chronic anticoagulation 02/18/2016  . Hyperkalemia 07/25/2015  . Atherosclerosis of native arteries of extremity with intermittent claudication ([place]) 10/21/2014  . Thrombocytopenia ([place]) 10/20/2014  . Macrocytosis without anemia 10/20/2014  . OSA (obstructive sleep apnea) 05/15/2013  . Goals of care, counseling/discussion 05/01/2013  . History of mechanical aortic valve replacement 01/02/2013  . Obesity (BMI 30-39.9) 10/20/2012  . Type II diabetes mellitus with complication ([place]) 21/19/4174  . Acute asthmatic bronchitis 01/18/2012  . ACUTE KIDNEY FAILURE UNSPECIFIED 02/04/2009  . CUTANEOUS ERUPTIONS, DRUG-INDUCED 02/04/2009  . HLD (hyperlipidemia) 02/03/2009  . Essential hypertension 02/03/2009  . Coronary atherosclerosis 02/03/2009  . Atrial fibrillation ([place]) 02/03/2009  . Chronic diastolic heart failure ([place]) 02/03/2009  . CAROTID ENDARTERECTOMY, LEFT, HX OF  02/03/2009  . INF&INFLAM REACT DUE CARD DEVICE IMPLANT&GRAFT 12/30/2008   Past Medical History:  Past Medical History:  Diagnosis Date  . Carotid artery disease ([place]) 1994   s/p left carotid endarerectomy   . Chronic atrial fibrillation    a. on coumadin   . Chronic diastolic CHF (congestive heart failure) ([place])   . Diabetes mellitus without complication ([place])    dx 2016  . Dyspnea   . Heart murmur   . Hx of CABG    a. 1994  . Hypertension   . OSA (obstructive sleep apnea)   . Rheumatic fever   . S/P AVR (aortic valve replacement)    a. mechanical valve 1996  . Subclavian bypass stenosis ([place])   . 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 Vitamin B12 deficiency    Past Surgical History:  Past Surgical History:  Procedure Laterality Date  . [place]   replaced due to aortic stenosis, [employer] mechanical prostesis  . CAROTID ENDARTERECTOMY Left 1997   subclavian bypass Done in [place]  . CORONARY ARTERY BYPASS GRAFT  1994   w SVG to RCA and SVG to circumflex  . heart bypass     Done in [place]  . MULTIPLE EXTRACTIONS WITH ALVEOLOPLASTY Bilateral 12/12/2017   Procedure: Extraction of tooth #'s 1, 12,13,14,15,17,18,19, 29, and 30 with alveoloplasty and gross debridement of remaining teeth`;  Surgeon: Lenn Cal, DDS;  Location: [facility];  Service: Oral Surgery;  Laterality: Bilateral;  MULTIPLE EXTRACTION WITH ALVEOLOPLASTY WITH PRE PROSTHETIC SURGERY AND GROSS DEBRIDEMENT OF REMAINING TEETH  . TEE WITHOUT CARDIOVERSION N/A 10/23/2017   Procedure: TRANSESOPHAGEAL ECHOCARDIOGRAM (TEE);  Surgeon: Larey Dresser, MD;  Location: [facility] ENDOSCOPY;  Service: Cardiovascular;  Laterality: N/A;  . TONSILLECTOMY     Social History:  reports that he quit smoking about 25 years ago. His smoking use included cigarettes. He has a 30.00 pack-year smoking history. He has never used smokeless tobacco. He reports that  he drinks alcohol. He reports that he does not use drugs.  Family / Support Systems Marital Status: Married Patient Roles: Spouse, Parent Spouse/Significant Other: [name] [phone]  [phone] Children: Local daughter and daughter in [place], son in [place] Other Supports: Friends and church members Anticipated Caregiver: Wife Ability/Limitations of Caregiver: Wife is in fairly good health and can assist him Caregiver Availability: 24/7 Family Dynamics: Close with their three children and church members. They have always been able to manage and helped one another. He feels he needs to get stronger whil here and then will be able to take care of himself.  Social History Preferred language: English Religion: Catholic Cultural Background: No issues Education: Some college Read: Yes Write: Yes Employment Status: Retired Freight forwarder Issues: No issues Guardian/Conservator: None-according to MD pt is capable of making his own decisions while here   Abuse/Neglect Abuse/Neglect Assessment Can Be Completed: Yes Physical Abuse: Denies Verbal Abuse: Denies Sexual Abuse: Denies Exploitation of patient/patient's resources: Denies Self-Neglect: Denies  Emotional Status Pt's affect, behavior adn adjustment status: Pt is motivated to get stronger and get back home. He has been in and out of a hospital several times in the past six months and he hopes this will end. He wants to get back to his independent level so he does not need to burden his wife. Sheis willing to help him but he wants to be able to do on his own. Recent Psychosocial Issues: multiple health issues been hospitalized several time sin the past six months Pyschiatric History: No history can express his concerns and feelings he seems to be coping appropriately and wants to begin the rehab program. Will ask team for input and see how he is adjusting throughout his stay. Substance Abuse History: No  issues  Patient / Family Perceptions, Expectations & Goals Pt/Family understanding of illness & functional limitations: Pt and wife can explain his health issues and reason for being in the hospital and for being on rehab. Both are hopeful he will do well here and get back to his independent level. They talk with the MD daily and feel they have a good understanding of his treatment plan going forward. Premorbid pt/family roles/activities: husband, father, grandfather, retiree, church member, friend, etc Anticipated changes in roles/activities/participation: resume Pt/family expectations/goals: Pt states: " I want to be able to take care of myself by the time I leave here."  Wife states: " I hope he can get stronger this seems to be his main problem. He can do for himself if he is able."  [employer]: Other (Comment)(Active pt with [facility]) Premorbid Home Care/DME Agencies: Other (Comment)(has rollator and tub seat along with stair lift) Transportation available at discharge: Wife and family Resource referrals recommended: Support group (specify)  Discharge Planning Living Arrangements: Spouse/significant other Support Systems: Spouse/significant other, Children, Friends/neighbors, Church/faith community Type of Residence: Private residence Insurance Resources: [employer], Multimedia programmer (specify)(BCBS) Financial Screen Referred: No Living Expenses: Own Money Management: Spouse, Patient Does the patient have any problems obtaining your medications?: No Home Management: Wife pt did help before he became so weak Patient/Family Preliminary Plans: Retrun home with wife who can assist if needed. She  has helped him in the past and both manage. Pt wants to be able to do for himself when he leaves here and his goals look to be independent with assistive device like he as prior to admission. Social Work Anticipated Follow Up Needs: HH/OP, Support Group  Clinical  Impression Pleasant gentleman who is ready to get started on his rehab. He is glad to be here and motivated to get stronger and get home again. He does not want to burden his wife. Although if needed she is able to assist him. Will work on safe discharge plan, he is active with [facility] and has equipment he used prior to admission. Will follow along and work on discharge needs.  Elease Hashimoto 01/21/2018, 9:48 AM

## 2018-01-21 NOTE — Progress Notes (Signed)
Physical Therapy Session Note  Patient Details  Name: [name] MRN: [id number] Date of Birth: [birth year]  Today's Date: 01/21/2018 PT Individual Time: 9794-8016 PT Individual Time Calculation (min): 40 min   Short Term Goals: Week 1:  PT Short Term Goal 1 (Week 1): Pt will ambulate x 50 ft with LRAD and min A PT Short Term Goal 2 (Week 1): Pt will perform sit to stand with min A consistently PT Short Term Goal 3 (Week 1): Pt will initiate curb step training  Skilled Therapeutic Interventions/Progress Updates:    Pt seated in w/c upon PT arrival, agreeable to therapy tx and reports pain 2/10 in L thigh area. Pt transported to the gym and performed stand pivot transfer from w/c<>mat with RW and min assist. Pt performed sit<>stand from mat with RW and min assist, limited use of L LE. Pt performed x 10 mini squats with UE support on RW working on LE strength and ROM. Pt worked on dynamic standing balance while tossing horseshoes, x 2 trials with min guard assist for balance. Therapist performed manual L quad and hamstring stretches for ROM 2 x 30 sec each. Pt ambulated 2 x 50 ft with RW and min assist, verbal cues for decreased reliance on RW. Pt transported back to room and left seated with needs in reach and chair alarm set.   Therapy Documentation Precautions:  Precautions Precautions: Fall Restrictions Weight Bearing Restrictions: No Other Position/Activity Restrictions: WBAT LLE   Therapy/Group: Individual Therapy  Netta Corrigan, PT, DPT 01/21/2018, 4:25 PM

## 2018-01-21 NOTE — Progress Notes (Signed)
Occupational Therapy Session Note  Patient Details  Name: [name] MRN: [id number] Date of Birth: [birth year]  Today's Date: 01/21/2018 OT Individual Time: 0600-4599 OT Individual Time Calculation (min): 60 min    Short Term Goals: Week 1:  OT Short Term Goal 1 (Week 1): Pt will sit to stand wiht min A in prep for LB dressing OT Short Term Goal 2 (Week 1): Pt will complete BSC/toilet transfer wiht Min A OT Short Term Goal 3 (Week 1): Pt will thread BLE into pants with AE PRN and supervision OT Short Term Goal 4 (Week 1): Pt will complete clothing managment prior to toileting with CGA  Skilled Therapeutic Interventions/Progress Updates:    Upon entering the room, pt seated in wheelchair awaiting OT arrival with no c/o pain but does report extreme fatigue. Pt reporting urgency for BM and needs mod lifting assistance to stand from wheelchair height. Pt ambulates with RW and min guard for safety 10' into bathroom. Pt able to manage clothing with steady assistance for balance. Pt unable to void while on toilet. Hand hygiene performed while in standing at sink with close supervision for balance. Pt returning to wheelchair to take seated rest break. OT educated pt on energy conservation for home with self care tasks, mobility, and community mobility. Pt verbalized understanding and very active in conversation but education to continue. Pt remained seated in wheelchair with chair alarm activated and call bell within reach.   Therapy Documentation Precautions:  Precautions Precautions: Fall Restrictions Weight Bearing Restrictions: No Other Position/Activity Restrictions: WBAT LLE General:   Vital Signs: Therapy Vitals Temp: 98.2 F (36.8 C) Pulse Rate: 79 BP: 111/60 Patient Position (if appropriate): Sitting Oxygen Therapy SpO2: 96 % O2 Device: Room Air Pain: Pain Assessment Pain Score: 0-No pain ADL: ADL Eating: Independent Grooming: Setup Where Assessed-Grooming:  Chair Upper Body Bathing: Setup Where Assessed-Upper Body Bathing: Edge of bed Lower Body Bathing: Maximal assistance Where Assessed-Lower Body Bathing: Edge of bed Upper Body Dressing: Setup Where Assessed-Upper Body Dressing: Edge of bed Lower Body Dressing: Maximal assistance Where Assessed-Lower Body Dressing: Edge of bed Toileting: Dependent Where Assessed-Toileting: Bedside Commode Toilet Transfer: Moderate assistance Toilet Transfer Method: Stand pivot Toilet Transfer Equipment: Bedside commode, Other (comment)(RW) Tub/Shower Transfer: Not assessed   Therapy/Group: Individual Therapy  Gypsy Decant 01/21/2018, 4:09 PM

## 2018-01-21 NOTE — Care Management Note (Signed)
[facility] Individual Statement of Services  Patient Name:  [name]  Date:  01/21/2018  Welcome to the [facility].  Our goal is to provide you with an individualized program based on your diagnosis and situation, designed to meet your specific needs.  With this comprehensive rehabilitation program, you will be expected to participate in at least 3 hours of rehabilitation therapies Monday-Friday, with modified therapy programming on the weekends.  Your rehabilitation program will include the following services:  Physical Therapy (PT), Occupational Therapy (OT), 24 hour per day rehabilitation nursing, Therapeutic Recreaction (TR), Case Management (Social Worker), Rehabilitation Medicine, Nutrition Services and Pharmacy Services  Weekly team conferences will be held on Wednesday to discuss your progress.  Your Social Worker will talk with you frequently to get your input and to update you on team discussions.  Team conferences with you and your family in attendance may also be held.  Expected length of stay: 10-14 days Overall anticipated outcome: independent with device  Depending on your progress and recovery, your program may change. Your Social Worker will coordinate services and will keep you informed of any changes. Your Social Worker's name and contact numbers are listed  below.  The following services may also be recommended but are not provided by the [facility] will be made to provide these services after discharge if needed.  Arrangements include referral to agencies that provide these services.  Your insurance has been verified to be:  [employer] Your primary doctor is:  Tyrone Apple [name]  Pertinent information will be shared with your doctor and your insurance company.  Social Worker:  Ovidio Kin, [place] or [phone]  Information discussed with and copy given to patient by: Elease Hashimoto, 01/21/2018, 9:31 AM

## 2018-01-21 NOTE — Progress Notes (Signed)
[name] for warfarin Indication: atrial fibrillation and mechanical aortic valve  Labs: Recent Labs    01/18/18 1230 01/19/18 0602 01/20/18 0614 01/21/18 0623  HGB  --  9.1*  --   --   HCT  --  30.2*  --   --   PLT  --  223  --   --   LABPROT  --  25.5* 28.0* 27.8*  INR  --  2.37 2.66 2.64  CREATININE 1.06 1.10  --  1.26*    Estimated Creatinine Clearance: 54.4 mL/min (A) (by C-G formula based on SCr of 1.26 mg/dL (H)).   Assessment: Patient is an 69yoM with history of mechanical aortic valve replacement, permanent afib continue on warfarin with lovenox bridge. Today is day 4 of lovenox overlap. Warfarin reversed during hospitalization but now restarted. INR remains therapeutic at 2.64. Of note, nurse reported that patient has had some bleeding from gums and appearance of bruising on arms on 10/20  PTA dose: 7.5mg  MWF, 5mg  all other days  Goal of Therapy:  INR 2.5 - 3.5 Monitor platelets by anticoagulation protocol: Yes   Plan:  Give Coumadin 7.5mg  PO x 1 tonight Monitor daily INR, CBC, s/s of bleed  [name], PharmD, BCPS Clinical Pharmacist Phone number [phone] 01/21/2018 7:46 AM

## 2018-01-21 NOTE — Progress Notes (Signed)
Occupational Therapy Session Note  Patient Details  Name: Archibald Marchetta MRN: [id number] Date of Birth: [birth year]  Today's Date: 01/21/2018 OT Individual Time: 1115-1200 OT Individual Time Calculation (min): 45 min    Short Term Goals: Week 1:  OT Short Term Goal 1 (Week 1): Pt will sit to stand wiht min A in prep for LB dressing OT Short Term Goal 2 (Week 1): Pt will complete BSC/toilet transfer wiht Min A OT Short Term Goal 3 (Week 1): Pt will thread BLE into pants with AE PRN and supervision OT Short Term Goal 4 (Week 1): Pt will complete clothing managment prior to toileting with CGA  Skilled Therapeutic Interventions/Progress Updates:    Pt received in w/c after completing PT. He was dressed for the day and declined getting undressed to bathe. Pt agreeable to going back to therapy to work on activity tolerance, standing endurance and LLE ROM.  Pt transported to gym where he worked with a 4lb dowel bar on various shoulder exercises for 10 reps of each exercise for 8 sets total with short rest break in between sets.   Standing endurance of 5 minutes with CGA at hi low table while he made 2 pipe tree puzzles.   O2 sats 96-98% on room air with activity. LLE self AROM with foot placed on pillow case on floor working on knee flex/ext and abd/add of hip.  Pt tolerated exercise well in small ROM. Pt taken back to room for lunch.  Chair alarm set and all needs met.  Therapy Documentation Precautions:  Precautions Precautions: Fall Restrictions Weight Bearing Restrictions: No Other Position/Activity Restrictions: WBAT LLE    Vital Signs: Oxygen Therapy SpO2: 98 % O2 Device: Room Air Pain: Pain Assessment Pain Score: 0-No pain ADL: ADL Eating: Independent Grooming: Setup Where Assessed-Grooming: Chair Upper Body Bathing: Setup Where Assessed-Upper Body Bathing: Edge of bed Lower Body Bathing: Maximal assistance Where Assessed-Lower Body Bathing: Edge of bed Upper Body  Dressing: Setup Where Assessed-Upper Body Dressing: Edge of bed Lower Body Dressing: Maximal assistance Where Assessed-Lower Body Dressing: Edge of bed Toileting: Dependent Where Assessed-Toileting: Bedside Commode Toilet Transfer: Moderate assistance Toilet Transfer Method: Stand pivot Toilet Transfer Equipment: Bedside commode, Other (comment)(RW) Tub/Shower Transfer: Not assessed      Therapy/Group: Individual Therapy  Sandhya Denherder 01/21/2018, 12:42 PM

## 2018-01-21 NOTE — Progress Notes (Addendum)
Physical Therapy Session Note  Patient Details  Name: [name] MRN: [id number] Date of Birth: 04/04/1935  Today's Date: 01/21/2018 PT Individual Time: 4235-3614 PT Individual Time Calculation (min): 45 min   Short Term Goals: Week 1:  PT Short Term Goal 1 (Week 1): Pt will ambulate x 50 ft with LRAD and min A PT Short Term Goal 2 (Week 1): Pt will perform sit to stand with min A consistently PT Short Term Goal 3 (Week 1): Pt will initiate curb step training      Skilled Therapeutic Interventions/Progress Updates:   Pt sitting up in w/c.  PT switched out ELR on L for regular footrest at pt's request.  He stated that L foot swelling is a long term problem that comes and goes.  PT informed him that he may need bil ELRs due to bil foot swelling and noted trophic changes bil feet.  W/c propulsion on level tile using bil UEs with supervision.  Cues for efficiency and turns.  Stand pivot w/c > NuStep at level 4, x 6 minutes for general activity tolerance and LLE AROM.  Pt had to urinate urgently.  Sit> stand with RW, min assist for clothing mgt.  Pt used urinal in standing continent of urine.    Gait training on level tile with RW, x 50' including turns, with min assist.  Pt's L knee buckled x 3, recovered independently.  Pt stated that his L knee starts to buckle when he is fatigued.   Pt left resting in w/c with seat alarm set; PT put ELR back on L side to address LLE edema. Gregary Signs, OT entering room.     Therapy Documentation Precautions:  Precautions Precautions: Fall Restrictions Weight Bearing Restrictions: No Other Position/Activity Restrictions: WBAT LLE   Pain: pt denies   Therapy/Group: Individual Therapy  Zalea Pete 01/21/2018, 12:26 PM

## 2018-01-21 NOTE — Progress Notes (Signed)
[facility] PHYSICAL MEDICINE & REHABILITATION PROGRESS NOTE   Subjective/Complaints:  No issues overnite, no breathing issues, swelling feels better  ROS: neg CP, SOB, N/V/D   Objective:   No results found. Recent Labs    01/19/18 0602  WBC 3.7*  HGB 9.1*  HCT 30.2*  PLT 223   Recent Labs    01/19/18 0602 01/21/18 0623  NA 136 135  K 3.4* 4.0  CL 94* 95*  CO2 31 32  GLUCOSE 97 112*  BUN 33* 37*  CREATININE 1.10 1.26*  CALCIUM 8.8* 9.0    Intake/Output Summary (Last 24 hours) at 01/21/2018 0817 Last data filed at 01/21/2018 0749 Gross per 24 hour  Intake 480 ml  Output 1400 ml  Net -920 ml     Physical Exam: Vital Signs Blood pressure 107/66, pulse 76, temperature 98 F (36.7 C), resp. rate 18, weight 96.6 kg, SpO2 99 %.     Assessment/Plan: 1. Functional deficits secondary to debility following Left thigh hematoma which require 3+ hours per day of interdisciplinary therapy in a comprehensive inpatient rehab setting.  Physiatrist is providing close team supervision and 24 hour management of active medical problems listed below.  Physiatrist and rehab team continue to assess barriers to discharge/monitor patient progress toward functional and medical goals Medical Problem List and Plan:  1. Functional and mobility deficits secondary to left thigh hematoma and debility  -Cont CIR PT, OT 2. DVT Prophylaxis/Anticoagulation: Pharmaceutical: Coumadin and INR therapeutic 2.64 3. Pain Management: Tylenol as needed  4. Mood: LCSW to follow for evaluation and support  5. Neuropsych: This patient is capable of making decisions on his own behalf.  6. Skin/Wound Care: Damp to dry dressing to burn on left thigh to remove fibronecrotic debris.  -Air mattress for pressure relief due to MAST with question blistering. Maintain adequate nutritional status.  7. Fluids/Electrolytes/Nutrition: Monitor I's and O's. Serial check of labs. Add protein supplements for protein  calorie malnutrition  8. CAD s/p AVR/CAF/severe MR: On Coumadin, amiodarone and torsemide. Monitor heart rate twice daily  9. LLE hematoma with edema: Continue compressive right wraps as well as elevation.  10. T2DM: Monitor blood sugars AC at bedtime  CBG (last 3)  Recent Labs    01/20/18 1648 01/20/18 2214 01/21/18 0651  GLUCAP 86 102* 97  Controlled 10/21    11. Chronic diastolic CHF: Has been referred to CHF clinic recently and Oxygen dependent continue torsemide. He reports that Zaroxolyn on hold per cards.  -monitor daily weights  Last Weight  Most recent update: 01/21/2018  5:08 AM   Weight  96.6 kg (213 lb)           Elevated BUN and Creat likely due to demadex with poor po intake will reduce dose and monitor 12. ABLA: Continue to monitor with serial checks as INR therapeutic now  13. Hypokalemia: We will increase supplement to 3 times daily due to persistent hypokalemia recheck labs in a.m.  14. OSA: Wife to bring patient's machine from home to help compliance.  Care Tool:  Bathing    Body parts bathed by patient: Right arm, Left arm, Chest, Abdomen, Front perineal area, Right upper leg, Buttocks, Left upper leg, Face   Body parts bathed by helper: Right lower leg Body parts n/a: Right lower leg   Bathing assist Assist Level: Minimal Assistance - Patient > 75%     Upper Body Dressing/Undressing Upper body dressing   What is the patient wearing?: Pull over shirt  Upper body assist Assist Level: Set up assist    Lower Body Dressing/Undressing Lower body dressing      What is the patient wearing?: Underwear/pull up, Pants     Lower body assist Assist for lower body dressing: Minimal Assistance - Patient > 75%     Toileting Toileting    Toileting assist Assist for toileting: Contact Guard/Touching assist Assistive Device Comment: urinal(bedpan)   Transfers Chair/bed transfer  Transfers assist     Chair/bed transfer assist level: Moderate  Assistance - Patient 50 - 74%     Locomotion Ambulation   Ambulation assist      Assist level: Minimal Assistance - Patient > 75% Assistive device: Walker-rolling Max distance: 36'   Walk 10 feet activity   Assist     Assist level: Minimal Assistance - Patient > 75% Assistive device: Walker-rolling   Walk 50 feet activity   Assist Walk 50 feet with 2 turns activity did not occur: Safety/medical concerns         Walk 150 feet activity   Assist Walk 150 feet activity did not occur: Safety/medical concerns         Walk 10 feet on uneven surface  activity   Assist Walk 10 feet on uneven surfaces activity did not occur: Safety/medical concerns         Wheelchair     Assist Will patient use wheelchair at discharge?: No             Wheelchair 50 feet with 2 turns activity    Assist            Wheelchair 150 feet activity     Assist              LOS: 3 days A FACE TO FACE EVALUATION WAS PERFORMED  Charlett Blake 01/21/2018, 8:17 AM

## 2018-01-22 ENCOUNTER — Inpatient Hospital Stay (HOSPITAL_COMMUNITY): Payer: Self-pay

## 2018-01-22 ENCOUNTER — Inpatient Hospital Stay (HOSPITAL_COMMUNITY): Payer: Self-pay | Admitting: Physical Therapy

## 2018-01-22 ENCOUNTER — Inpatient Hospital Stay (HOSPITAL_COMMUNITY): Payer: Self-pay | Admitting: Occupational Therapy

## 2018-01-22 LAB — BASIC METABOLIC PANEL
Anion gap: 10 (ref 5–15)
BUN: 43 mg/dL — AB (ref 8–23)
CO2: 29 mmol/L (ref 22–32)
CREATININE: 1.52 mg/dL — AB (ref 0.61–1.24)
Calcium: 9.2 mg/dL (ref 8.9–10.3)
Chloride: 93 mmol/L — ABNORMAL LOW (ref 98–111)
GFR calc Af Amer: 48 mL/min — ABNORMAL LOW (ref >60)
GFR calc non Af Amer: 42 mL/min — ABNORMAL LOW (ref >60)
GLUCOSE: 107 mg/dL — AB (ref 70–99)
POTASSIUM: 4.4 mmol/L (ref 3.5–5.1)
Sodium: 132 mmol/L — ABNORMAL LOW (ref 135–145)

## 2018-01-22 LAB — PROTIME-INR
INR: 2.67
Prothrombin Time: 28.1 seconds — ABNORMAL HIGH (ref 11.4–15.2)

## 2018-01-22 LAB — GLUCOSE, CAPILLARY
GLUCOSE-CAPILLARY: 116 mg/dL — AB (ref 70–99)
Glucose-Capillary: 102 mg/dL — ABNORMAL HIGH (ref 70–99)
Glucose-Capillary: 147 mg/dL — ABNORMAL HIGH (ref 70–99)
Glucose-Capillary: 106 mg/dL — ABNORMAL HIGH (ref 70–99)

## 2018-01-22 MED ORDER — POTASSIUM CHLORIDE CRYS ER 20 MEQ PO TBCR
20.0000 meq | EXTENDED_RELEASE_TABLET | Freq: Two times a day (BID) | ORAL | Status: DC
  Administered 2018-01-22 – 2018-01-25 (×7): 20 meq via ORAL
  Filled 2018-01-22 (×7): qty 1

## 2018-01-22 MED ORDER — WARFARIN SODIUM 7.5 MG PO TABS
7.5000 mg | ORAL_TABLET | Freq: Once | ORAL | Status: AC
  Administered 2018-01-22: 7.5 mg via ORAL
  Filled 2018-01-22: qty 1

## 2018-01-22 NOTE — Progress Notes (Signed)
Occupational Therapy Session Note  Patient Details  Name: [name] MRN: [id number] Date of Birth: Dec 02, 1935  Today's Date: 01/22/2018 OT Individual Time: 1330-1400 OT Individual Time Calculation (min): 30 min    Short Term Goals: Week 1:  OT Short Term Goal 1 (Week 1): Pt will sit to stand wiht min A in prep for LB dressing OT Short Term Goal 2 (Week 1): Pt will complete BSC/toilet transfer wiht Min A OT Short Term Goal 3 (Week 1): Pt will thread BLE into pants with AE PRN and supervision OT Short Term Goal 4 (Week 1): Pt will complete clothing managment prior to toileting with CGA  Skilled Therapeutic Interventions/Progress Updates:    OT intervention with focus on toilet transfers, walk in shower transfers with RW, and functional amb with Rollator in ADL apartment.  Pt states he currently owns 3 Rollators.  Pt practiced amb in ADL apartment at close supervision with increased awareness that he must be more careful with Rollator vs RW.  Pt practiced stepping over into walk in shower at supervision level. Pt returned to room and remained in w/c with chair alarm activated and all needs within reach.   Therapy Documentation Precautions:  Precautions Precautions: Fall Restrictions Weight Bearing Restrictions: No Other Position/Activity Restrictions: WBAT LLE  Pain:  Pt c/o 2/10 pain in upper LLE; RN aware and repositioned   Therapy/Group: Individual Therapy  [name] 01/22/2018, 2:24 PM

## 2018-01-22 NOTE — Progress Notes (Signed)
Occupational Therapy Session Note  Patient Details  Name: [name] MRN: [id number] Date of Birth: Sep 14, 1935  Today's Date: 01/22/2018 OT Individual Time: 1430-1456 OT Individual Time Calculation (min): 26 min    Short Term Goals: Week 1:  OT Short Term Goal 1 (Week 1): Pt will sit to stand wiht min A in prep for LB dressing OT Short Term Goal 2 (Week 1): Pt will complete BSC/toilet transfer wiht Min A OT Short Term Goal 3 (Week 1): Pt will thread BLE into pants with AE PRN and supervision OT Short Term Goal 4 (Week 1): Pt will complete clothing managment prior to toileting with CGA  Skilled Therapeutic Interventions/Progress Updates:    Treatment session with focus on sit > stand, standing tolerance, and activity tolerance.  Pt received upright in w/c agreeable to therapy.  Pt requesting to weigh himself, therefore engaged in sit > stand in front of hospital scale and stepped up with hand held assist during stepping on and off scale.  Engaged in sit > stand at high-low table with focus on increased anterior weight shift and decreased dependence on UE strength during sit > stand.  Engaged in table top task with pt tolerating standing 5 mins at a time without complaints of SOB or fatigue.  Pt progressed from min assist with sit > stand to contact guard throughout session.  Returned to room and left upright in w/c with all needs in reach.  Therapy Documentation Precautions:  Precautions Precautions: Fall Restrictions Weight Bearing Restrictions: No Other Position/Activity Restrictions: WBAT LLE General:   Vital Signs: Therapy Vitals Temp: 98.4 F (36.9 C) Pulse Rate: 77 Resp: 20 BP: (!) 100/54 Patient Position (if appropriate): Sitting Oxygen Therapy SpO2: 96 % O2 Device: Room Air Pain:  Pt with no c/o pain   Therapy/Group: Individual Therapy  Simonne Come 01/22/2018, 4:08 PM

## 2018-01-22 NOTE — Progress Notes (Signed)
[facility] for warfarin Indication: atrial fibrillation and mechanical aortic valve  Labs: Recent Labs    01/20/18 0614 01/21/18 0623 01/22/18 0455  LABPROT 28.0* 27.8* 28.1*  INR 2.66 2.64 2.67  CREATININE  --  1.26* 1.52*    Estimated Creatinine Clearance: 45.1 mL/min (A) (by C-G formula based on SCr of 1.52 mg/dL (H)).   Assessment: Patient is an 82yoM on Coumadin 5mg  daily exc 7.5mg  MWF PTA for hx afib, mech valve. Brideged with Lovenox. Now INR is therapeutic at 2.67. Hgb 9.1, plts wnl. Some bleeding of gums and bruising on 10/20. No dose last night on 10/21 so will give dose today earlier.  Goal of Therapy:  INR 2.5 - 3.5 Monitor platelets by anticoagulation protocol: Yes   Plan:  Give Coumadin 7.5mg  PO x 1 this morning Monitor daily INR, CBC, s/s of bleed  [name], PharmD, BCPS Clinical Pharmacist Phone number [phone] 01/22/2018 7:49 AM

## 2018-01-22 NOTE — Progress Notes (Signed)
[facility] PHYSICAL MEDICINE & REHABILITATION PROGRESS NOTE   Subjective/Complaints:  Pt "amazed" at improvement in mobility over the last week  ROS: neg CP, SOB, N/V/D   Objective:   No results found. No results for input(s): WBC, HGB, HCT, PLT in the last 72 hours. Recent Labs    01/21/18 0623 01/22/18 0455  NA 135 132*  K 4.0 4.4  CL 95* 93*  CO2 32 29  GLUCOSE 112* 107*  BUN 37* 43*  CREATININE 1.26* 1.52*  CALCIUM 9.0 9.2    Intake/Output Summary (Last 24 hours) at 01/22/2018 0823 Last data filed at 01/22/2018 0816 Gross per 24 hour  Intake 804 ml  Output 900 ml  Net -96 ml     Physical Exam: Vital Signs Blood pressure (!) 94/59, pulse 74, temperature 98.1 F (36.7 C), resp. rate 18, weight 95.3 kg, SpO2 95 %.  General: No acute distress Mood and affect are appropriate Heart: Regular rate and rhythm no rubs, + diastolic murmurs or extra sounds Lungs: Clear to auscultation, breathing unlabored, no rales or wheezes Abdomen: Positive bowel sounds, soft nontender to palpation, nondistended Extremities: No clubbing, cyanosis, or edema Skin: No evidence of breakdown, no evidence of rash Neurologic: Cranial nerves II through XII intact, motor strength is 5/5 in bilateral deltoid, bicep, tricep, grip, hip flexor, knee extensors, ankle dorsiflexor and plantar flexor Sensory exam normal sensation to light touch and proprioception in bilateral upper and lower extremities Cerebellar exam normal finger to nose to finger as well as heel to shin in bilateral upper and lower extremities Musculoskeletal: Full range of motion in all 4 extremities. No joint swelling    Assessment/Plan: 1. Functional deficits secondary to debility following Left thigh hematoma which require 3+ hours per day of interdisciplinary therapy in a comprehensive inpatient rehab setting.  Physiatrist is providing close team supervision and 24 hour management of active medical problems listed  below.  Physiatrist and rehab team continue to assess barriers to discharge/monitor patient progress toward functional and medical goals Medical Problem List and Plan:  1. Functional and mobility deficits secondary to left thigh hematoma and debility  -Cont CIR PT, OT team conf in am 2. DVT Prophylaxis/Anticoagulation: Pharmaceutical: Coumadin and INR therapeutic 2.64 3. Pain Management: Tylenol as needed  4. Mood: LCSW to follow for evaluation and support  5. Neuropsych: This patient is capable of making decisions on his own behalf.  6. Skin/Wound Care: Damp to dry dressing to  left thigh to remove fibronecrotic debris.  -Air mattress for pressure relief due to MAST with question blistering. Maintain adequate nutritional status.  7. Fluids/Electrolytes/Nutrition: Monitor I's and O's. Serial check of labs. Add protein supplements for protein calorie malnutrition  8. CAD s/p AVR/CAF/severe MR: On Coumadin, amiodarone and torsemide. Monitor heart rate twice daily  9. LLE hematoma with edema: Continue compressive right wraps as well as elevation.  10. ?T2DM:  CBG (last 3)  Recent Labs    01/21/18 1738 01/21/18 2148 01/22/18 0644  GLUCAP 97 119* 102*  Controlled 10/22, hgb A1C 4.7 d/c CBG    11. Chronic diastolic CHF: Has been referred to CHF clinic recently and Oxygen dependent continue torsemide. He reports that Zaroxolyn on hold per cards.  -monitor daily weights   Filed Weights   01/20/18 1200 01/21/18 0504 01/22/18 0641  Weight: 90.4 kg 96.6 kg 95.3 kg   Elevated BUN and Creat likely due to demadex with poor po intake will reduce dose and monitor- discussed with cardiologycreat has been running  around  1.5 Will check daily weight and adjust diuretic based on this 12. ABLA: Continue to monitor with serial checks as INR therapeutic now  13. Hypokalemia: We will increase supplement to 3 times daily due to persistent hypokalemia recheck labs in a.m.  14. OSA: Wife to bring patient's  machine from home to help compliance.  Care Tool:  Bathing     Body parts bathed by patient: Right arm, Left arm, Chest, Abdomen, Front perineal area, Right upper leg, Buttocks, Left upper leg, Face   Body parts bathed by helper: Right lower leg Body parts n/a: Right lower leg   Bathing assist Assist Level: Minimal Assistance - Patient > 75%     Upper Body Dressing/Undressing Upper body dressing   What is the patient wearing?: Pull over shirt    Upper body assist Assist Level: Set up assist    Lower Body Dressing/Undressing Lower body dressing      What is the patient wearing?: Underwear/pull up, Pants     Lower body assist Assist for lower body dressing: Minimal Assistance - Patient > 75%     Toileting Toileting    Toileting assist Assist for toileting: Minimal Assistance - Patient > 75% Assistive Device Comment: (urinal/bedpan not assistive, clothing manage/hygiene only)   Transfers Chair/bed transfer  Transfers assist     Chair/bed transfer assist level: Minimal Assistance - Patient > 75%     Locomotion Ambulation   Ambulation assist      Assist level: Minimal Assistance - Patient > 75% Assistive device: Walker-rolling Max distance: 40 ft   Walk 10 feet activity   Assist     Assist level: Minimal Assistance - Patient > 75% Assistive device: Walker-rolling   Walk 50 feet activity   Assist Walk 50 feet with 2 turns activity did not occur: Safety/medical concerns  Assist level: Minimal Assistance - Patient > 75% Assistive device: Walker-rolling    Walk 150 feet activity   Assist Walk 150 feet activity did not occur: Safety/medical concerns         Walk 10 feet on uneven surface  activity   Assist Walk 10 feet on uneven surfaces activity did not occur: Safety/medical concerns         Wheelchair     Assist Will patient use wheelchair at discharge?: No Type of Wheelchair: Manual    Wheelchair assist level:  Supervision/Verbal cueing Max wheelchair distance: 100    Wheelchair 50 feet with 2 turns activity    Assist        Assist Level: Supervision/Verbal cueing   Wheelchair 150 feet activity     Assist              LOS: 4 days A FACE TO FACE EVALUATION WAS PERFORMED  Charlett Blake 01/22/2018, 8:23 AM

## 2018-01-22 NOTE — Progress Notes (Signed)
Physical Therapy Session Note  Patient Details  Name: [name] MRN: [id number] Date of Birth: [birth year]  Today's Date: 01/22/2018 PT Individual Time: 5462-7035 PT Individual Time Calculation (min): 55 min   Short Term Goals: Week 1:  PT Short Term Goal 1 (Week 1): Pt will ambulate x 50 ft with LRAD and min A PT Short Term Goal 2 (Week 1): Pt will perform sit to stand with min A consistently PT Short Term Goal 3 (Week 1): Pt will initiate curb step training  Skilled Therapeutic Interventions/Progress Updates:   Pt in supine and agreeable to therapy, pain 1-2/10 in L thigh. Transferred to EOB and to w/c via stand pivot w/ min assist. Pt self-propelled w/c to/from therapy gym w/ BUEs for endurance training. Focused on global strengthening and functional endurance this session. Performed LLE strengthening and ROM exercises including heel slides 2x10 w/ prolonged stretch at end range, LAQs 2x10, standing hip flexion 2x10 and standing hip march 2x10. Performed multiple sit<>stands from w/c to RW, CGA fading to min assist w/ fatigue. Ambulated 89' x2 w/ CGA, one episode of L knee buckling but pt able to recover w/o assistance. Returned to room as pt needed to toilet and ambulated to/from bathroom ~15', total assist for brief management. Min assist for pericare. Ended session in w/c, all needs in reach.  Therapy Documentation Precautions:  Precautions Precautions: Fall Restrictions Weight Bearing Restrictions: No Other Position/Activity Restrictions: WBAT LLE Vital Signs: Therapy Vitals Temp: 98.1 F (36.7 C) Pulse Rate: 74 Resp: 18 BP: (!) 94/59 Patient Position (if appropriate): Lying Oxygen Therapy SpO2: 95 % O2 Device: Room Air  Therapy/Group: Individual Therapy  Sean Malinowski K Donnika Kucher 01/22/2018, 9:01 AM

## 2018-01-22 NOTE — Progress Notes (Signed)
Occupational Therapy Session Note  Patient Details  Name: [name] MRN: [id number] Date of Birth: September 22, 1935  Today's Date: 01/22/2018 OT Individual Time: 0930-1040 OT Individual Time Calculation (min): 70 min    Short Term Goals: Week 1:  OT Short Term Goal 1 (Week 1): Pt will sit to stand wiht min A in prep for LB dressing OT Short Term Goal 2 (Week 1): Pt will complete BSC/toilet transfer wiht Min A OT Short Term Goal 3 (Week 1): Pt will thread BLE into pants with AE PRN and supervision OT Short Term Goal 4 (Week 1): Pt will complete clothing managment prior to toileting with CGA  Skilled Therapeutic Interventions/Progress Updates:    OT intervention with focus on BADL retraining, sit<>stand, standing balance, functional amb with RW, safety awareness, and activity tolerance to increase independence with BADLs. Pt amb with RW to bathroom to transfer to shower for bathing.  Pt required min A for sit<>stand from lower surface of shower seat.  Pt performed all sit<>stand from w/c at supervision level.  Pt stood approx 5 mins a sink for grooming tasks and to bathe buttocks.  Pt was able to thread pants over feet without use of AE but required assistance donning socks without AE. Pt fatigues quickly and requires multiple rest breaks throughout session.   Therapy Documentation Precautions:  Precautions Precautions: Fall Restrictions Weight Bearing Restrictions: No Other Position/Activity Restrictions: WBAT LLE  Pain: Pain Assessment Pain Scale: 0-10 Pain Score: 0-No pain ADL: ADL Grooming: Setup Where Assessed-Grooming: Sitting at sink Upper Body Bathing: Setup Where Assessed-Upper Body Bathing: Shower Lower Body Bathing: Minimal assistance Where Assessed-Lower Body Bathing: Shower Upper Body Dressing: Setup Where Assessed-Upper Body Dressing: Sitting at sink Lower Body Dressing: Moderate assistance Where Assessed-Lower Body Dressing: Sitting at sink Tub/Shower Transfer:  Not assessed Social research officer, government: Minimal assistance Social research officer, government Method: Heritage manager: Shower seat with back   Therapy/Group: Individual Therapy  [name] 01/22/2018, 10:47 AM

## 2018-01-22 NOTE — Discharge Summary (Signed)
Physician Discharge Summary  Yer Olivencia OFB:[id number] DOB: 18-Oct-1935 DOA: 01/09/2018  PCP: Cassandria Anger, MD  Admit date: 01/09/2018 Discharge date: 01/18/2018  Time spent: 45 minutes  Recommendations for Outpatient Follow-up:  1. CIR for Rehab 2. Continue Coumadin, Goal INR 2.5-3.5, ideally 2.5-3,  stop Lovenox when INR >2.5   Discharge Diagnoses:    Large L thigh hematoma   Acute Blood loss anemia   chronic A. fib on Coumadin   PVD S/P left CEA   chronic diastolic CHF   type II DM,    CAD S/P CABG,    HTN,    OSA,    mechanical AVR,    B12 deficiency   Discharge Condition: low sodium heart healthy  Diet recommendation: DM heart healthy  Filed Weights   01/12/18 2300  Weight: 99 kg    History of present illness:  Pt. with PMH of chronic A. fib on Coumadin, PVD S/P left CEA, chronic diastolic CHF, type II DM, CAD S/P CABG, HTN, OSA, mechanical AVR, B12 deficiency; admitted on 01/09/2018, presented with complaint of shortness of breath, weakness and left leg hematoma, was found to have  hemoglobin of 5.6 and supratherapeutic INR  Hospital Course:   1.  Acute blood loss anemia/large Left thigh hematoma/Supra therapeutic INR. -Presented with hemoglobin of 5.6, and an INR of 10, with spontaneous thigh hematoma -S/P 6 Units PRBC transfusion and 3 FFP transfusion. -Stable and improved now -Orthopedic surgery was consulted recommend nonoperative management with compression stockings and follow-up in 3 weeks. -Ambulating, PT eval completed -Restarted Coumadin, started bridge with Lovenox for Goal INR 2.5-3 -Discharge planning, CIR today, stop Lovenox when INR >2.5  2.  Chronic A. Fib/Mechanical aortic valve/Supra therapeutic INR. -On admission patient received vitamin K, 3 units of FFP and 5 units of PRBC -Anticoagulation was stopped, INR drifted down, Coumadin resumed without bridging -INR is 2.1 today, started bridging with Lovenox yesterday -Continue  amiodarone. -dC to Rehab, stop Lovenox when INR 2.5 or above, Goal is 2.5-3.5, ideally 2.5-3  3.  Chronic diastolic CHF/Severe MR/CAD S/P CABG Echocardiogram July 2019 shows 50 to 55% EF.  Severe MR, -Continue home regimen of torsemide, Aldactone -Not a mitral clip candidate. Outpatient follow-up with cardio thoracic surgery. Continue Vytorin.  4.  OSA. Continue nightly BiPAP.  5.  Gout. Continue allopurinol.  6. Acute kidney injury on chronic kidney disease stage III. Baseline renal function between 1.3 and 1.4 Presented with mild worsening of 1.85, now  improving down to 1.2  7.  Hypokalemia. Replaced  8.  COPD/Chronic respiratory failure. Patient is on home oxygen, 2L  -Stable, Continue inhalers.  9. MASD Monitor.  Frequent turning. -WOC consult appreciated  Discharge Exam: Vitals:   01/17/18 1733 01/18/18 0736  BP: 99/64 103/60  Pulse: 84 71  Resp:    Temp: 98 F (36.7 C) 97.6 F (36.4 C)  SpO2: 100% 100%    General: AAOx3 Cardiovascular: S1S2/RRR Respiratory: Improved air movement  Discharge Instructions    Allergies as of 01/18/2018      Reactions   Rifampin Rash   May have been caused by Vancomycin or Rifampin (??)   Vancomycin Rash   May have been caused by Vancomycin or Rifampin (??)      Medication List    ASK your doctor about these medications   allopurinol 100 MG tablet Commonly known as:  ZYLOPRIM Take 1 tablet (100 mg total) by mouth daily.   amiodarone 200 MG tablet Commonly known as:  PACERONE  Take 1 tablet (200 mg total) by mouth daily.   cefUROXime 250 MG tablet Commonly known as:  CEFTIN Take 1 tablet (250 mg total) by mouth 2 (two) times daily for 14 days.   enoxaparin 100 MG/ML injection Commonly known as:  LOVENOX Inject 1 mL (100 mg total) into the skin every 12 (twelve) hours.   ezetimibe-simvastatin 10-20 MG tablet Commonly known as:  VYTORIN Take 1 tablet by mouth at bedtime.   metolazone 2.5 MG  tablet Commonly known as:  ZAROXOLYN Take 1 tablet (2.5 mg total) by mouth every Monday.   phytonadione 5 MG tablet Commonly known as:  VITAMIN K Take 0.5 tablets (2.5 mg total) by mouth once for 1 dose. Ask about: Should I take this medication?   Potassium Chloride ER 20 MEQ Tbcr Take 40 mEq by mouth daily. PLEASE TAKE EXTRA DOSE EVERY MONDAY   torsemide 100 MG tablet Commonly known as:  DEMADEX Take 1 tablet (100 mg total) by mouth 2 (two) times daily.   warfarin 5 MG tablet Commonly known as:  COUMADIN Take as directed. If you are unsure how to take this medication, talk to your nurse or doctor. Original instructions:  Take 1 tablet (5 mg total) by mouth daily at 6 PM.      Allergies  Allergen Reactions  . Rifampin Rash    May have been caused by Vancomycin or Rifampin (??)  . Vancomycin Rash    May have been caused by Vancomycin or Rifampin (??)   Follow-up Information    Haddix, Thomasene Lot, MD. Schedule an appointment as soon as possible for a visit in 3 week(s).   Specialty:  Orthopedic Surgery Contact information: [address] [place] [address] [phone]            The results of significant diagnostics from this hospitalization (including imaging, microbiology, ancillary and laboratory) are listed below for reference.    Significant Diagnostic Studies: Ct Abdomen Pelvis Wo Contrast  Result Date: 01/09/2018 CLINICAL DATA:  Hematuria.  Left lower extremity swelling. EXAM: CT ABDOMEN AND PELVIS WITHOUT CONTRAST TECHNIQUE: Multidetector CT imaging of the abdomen and pelvis was performed following the standard protocol without IV contrast. COMPARISON:  03/16/2010 CT abdomen/pelvis. 10/26/2014 abdominal sonogram. FINDINGS: Lower chest: Left lower lobe 3 mm subpleural pulmonary nodule (series 5/image 19) is stable since 2011 CT and considered benign. Additional 2-3 mm left lower lobe pulmonary nodules are stable since 08/01/2017 chest CT and  considered benign. Intact lower sternotomy wire. Coronary atherosclerosis. Cardiomegaly. Hepatobiliary: Normal liver size. Dilated hepatic veins. Subcentimeter hypodense liver dome lesion is too small to characterize and stable since 08/01/2017 CT, probably benign. No additional liver lesions. Normal gallbladder with no radiopaque cholelithiasis. No biliary ductal dilatation. Pancreas: Normal, with no mass or duct dilation. Spleen: Normal size. No mass. Adrenals/Urinary Tract: Normal adrenals. Symmetric moderate renal atrophy. No hydronephrosis. No renal stones. No contour deforming renal masses. Chronic mild diffuse bladder wall thickening. No bladder stones. Bladder is not significantly distended. Stomach/Bowel: Normal non-distended stomach. Normal caliber small bowel with no small bowel wall thickening. Normal appendix. Moderate stool throughout the large bowel. No large bowel wall thickening, significant diverticulosis or acute pericolonic fat stranding. Vascular/Lymphatic: Atherosclerotic nonaneurysmal abdominal aorta. No pathologically enlarged lymph nodes in the abdomen or pelvis. Reproductive: Moderately enlarged prostate. Nonspecific internal prostatic calcification. Other: No pneumoperitoneum, ascites or focal fluid collection. Partial visualization of heterogeneous hyperdense intramuscular mass in the deep anterior proximal left thigh musculature (series 3/image 113)  measuring at least 8.6 x 7.3 cm. No evidence of a retroperitoneal hematoma. Musculoskeletal: No aggressive appearing focal osseous lesions. Moderate thoracolumbar spondylosis. IMPRESSION: 1. Heterogeneous hyperdense intramuscular mass in the deep anterior proximal left thigh musculature measuring at least 8.6 x 7.3 cm in axial dimensions, partially visualized on this CT abdomen/pelvis study, most compatible with intramuscular hematoma. Please see the separate concurrent CT left femur report for further details. 2. No evidence of  retroperitoneal hematoma. 3. No urolithiasis.  No hydronephrosis. 4. Chronic mild diffuse bladder wall thickening, probably due to chronic bladder outlet obstruction by the moderately enlarged prostate. 5. Cardiomegaly. 6. Moderate stool throughout the large bowel, suggesting constipation. 7.  Aortic Atherosclerosis (ICD10-I70.0). Electronically Signed   By: Ilona Sorrel M.D.   On: 01/09/2018 19:08   Ct Femur Left Wo Contrast  Result Date: 01/09/2018 CLINICAL DATA:  Left thigh swelling with drop in hemoglobin. EXAM: CT OF THE LOWER LEFT EXTREMITY WITHOUT CONTRAST TECHNIQUE: Multidetector CT imaging of the lower left extremity was performed according to the standard protocol. COMPARISON:  None. FINDINGS: Bones/Joint/Cartilage The included sacroiliac joints, pubic symphysis and hips are maintained. No fracture of the left femur nor suspicious osseous lesions. No hip or knee effusion. Ligaments Suboptimally assessed by CT. Muscles and Tendons Intramuscular hematoma centered within the left vastus lateralis muscle measuring 22.2 x 10.8 x 6.6 cm is identified (craniocaudad by transverse by AP) with hematocrit level noted. The remainder of the anterior compartment muscles are unremarkable. Soft tissues Mild soft tissue induration of the left thigh. IMPRESSION: Intramuscular hematoma of the left vastus lateralis muscle measuring 22 x 10.8 x 6.6 cm. No acute osseous abnormality. Electronically Signed   By: Ashley Royalty M.D.   On: 01/09/2018 19:08   Dg Chest Port 1 View  Result Date: 01/09/2018 CLINICAL DATA:  Low hemoglobin, pre transfusion EXAM: PORTABLE CHEST 1 VIEW COMPARISON:  December 23, 2017 FINDINGS: The heart size and mediastinal contours are stable. The heart size is enlarged. Patient status post prior median sternotomy. Both lungs are clear. The visualized skeletal structures are unremarkable. IMPRESSION: Cardiomegaly.  The lungs are clear. Electronically Signed   By: Abelardo Diesel M.D.   On: 01/09/2018  17:44    Microbiology: No results found for this or any previous visit (from the past 240 hour(s)).   Labs: Basic Metabolic Panel: Recent Labs  Lab 01/16/18 0310  01/18/18 0224 01/18/18 1230 01/19/18 0602 01/21/18 0623 01/22/18 0455  NA 133*   < > 133* 134* 136 135 132*  K 2.7*   < > 3.2* 3.5 3.4* 4.0 4.4  CL 93*   < > 93* 95* 94* 95* 93*  CO2 30   < > 31 31 31  32 29  GLUCOSE 105*   < > 112* 108* 97 112* 107*  BUN 29*   < > 31* 30* 33* 37* 43*  CREATININE 1.07   < > 1.08 1.06 1.10 1.26* 1.52*  CALCIUM 8.2*   < > 8.6* 8.8* 8.8* 9.0 9.2  MG 1.8  --   --   --   --   --   --    < > = values in this interval not displayed.   Liver Function Tests: Recent Labs  Lab 01/19/18 0602  AST 18  ALT 9  ALKPHOS 116  BILITOT 1.3*  PROT 6.1*  ALBUMIN 2.3*   No results for input(s): LIPASE, AMYLASE in the last 168 hours. No results for input(s): AMMONIA in the last 168 hours. CBC: Recent  Labs  Lab 01/16/18 0310 01/17/18 0228 01/18/18 0224 01/19/18 0602  WBC 4.5 4.5 4.9 3.7*  NEUTROABS  --   --   --  2.7  HGB 8.5* 8.8* 8.9* 9.1*  HCT 26.9* 28.6* 27.6* 30.2*  MCV 91.5 92.9 92.6 94.1  PLT 203 210 224 223   Cardiac Enzymes: No results for input(s): CKTOTAL, CKMB, CKMBINDEX, TROPONINI in the last 168 hours. BNP: BNP (last 3 results) Recent Labs    08/01/17 1804 10/18/17 1510  BNP 784.4* 1,073.3*    ProBNP (last 3 results) Recent Labs    03/30/17 1147 05/25/17 1044 07/10/17 1523  PROBNP 1,858* 2,749* 411.0*    CBG: Recent Labs  Lab 01/21/18 1212 01/21/18 1738 01/21/18 2148 01/22/18 0644 01/22/18 1141  GLUCAP 92 97 119* 102* 147*       Signed:  Domenic Polite MD.  [facility] 01/22/2018, 4:17 PM

## 2018-01-23 ENCOUNTER — Inpatient Hospital Stay (HOSPITAL_COMMUNITY): Payer: Self-pay

## 2018-01-23 ENCOUNTER — Encounter (HOSPITAL_COMMUNITY): Payer: Self-pay | Admitting: Psychology

## 2018-01-23 ENCOUNTER — Inpatient Hospital Stay (HOSPITAL_COMMUNITY): Payer: Self-pay | Admitting: *Deleted

## 2018-01-23 LAB — GLUCOSE, CAPILLARY
GLUCOSE-CAPILLARY: 106 mg/dL — AB (ref 70–99)
GLUCOSE-CAPILLARY: 96 mg/dL (ref 70–99)
Glucose-Capillary: 112 mg/dL — ABNORMAL HIGH (ref 70–99)

## 2018-01-23 LAB — PROTIME-INR
INR: 2.56
Prothrombin Time: 27.2 seconds — ABNORMAL HIGH (ref 11.4–15.2)

## 2018-01-23 MED ORDER — WARFARIN SODIUM 7.5 MG PO TABS
7.5000 mg | ORAL_TABLET | Freq: Once | ORAL | Status: AC
  Administered 2018-01-23: 7.5 mg via ORAL
  Filled 2018-01-23: qty 1

## 2018-01-23 NOTE — Evaluation (Signed)
Recreational Therapy Assessment and Plan  Patient Details  Name: [name] MRN: [id number] Date of Birth: 1936-02-03 Today's Date: 01/23/2018  Rehab Potential: Good ELOS: discharge 10/31  Assessment  Problem List:      Patient Active Problem List   Diagnosis Date Noted  . Debility 01/18/2018  . Anemia 01/09/2018  . Leg pain, anterior, left 01/07/2018  . Dysuria 01/07/2018  . Severe anemia 12/23/2017  . Supratherapeutic INR 12/23/2017  . Hyponatremia 12/23/2017  . Coronary artery disease with history of myocardial infarction without history of CABG 12/23/2017  . Bleeding from mouth 12/20/2017  . Post-op pain 12/12/2017  . Dental caries 12/12/2017  . Severe mitral regurgitation 11/14/2017  . Pressure injury of skin 10/29/2017  . Shortness of breath   . Palliative care by specialist   . RVF (right ventricular failure) ([place])   . Right heart failure ([place]) 10/18/2017  . Hypoxia   . COPD exacerbation ([place]) 08/01/2017  . Pancytopenia ([place]) 08/01/2017  . Epistaxis 07/11/2017  . Actinic keratoses 04/05/2017  . Leg wound, right 02/18/2016  . Edema 02/18/2016  . Chronic anticoagulation 02/18/2016  . Hyperkalemia 07/25/2015  . Atherosclerosis of native arteries of extremity with intermittent claudication ([place]) 10/21/2014  . Thrombocytopenia ([place]) 10/20/2014  . Macrocytosis without anemia 10/20/2014  . OSA (obstructive sleep apnea) 05/15/2013  . Goals of care, counseling/discussion 05/01/2013  . History of mechanical aortic valve replacement 01/02/2013  . Obesity (BMI 30-39.9) 10/20/2012  . Type II diabetes mellitus with complication ([place]) 11/57/2620  . Acute asthmatic bronchitis 01/18/2012  . ACUTE KIDNEY FAILURE UNSPECIFIED 02/04/2009  . CUTANEOUS ERUPTIONS, DRUG-INDUCED 02/04/2009  . HLD (hyperlipidemia) 02/03/2009  . Essential hypertension 02/03/2009  . Coronary atherosclerosis 02/03/2009  . Atrial fibrillation ([place]) 02/03/2009  . Chronic diastolic heart failure  ([place]) 02/03/2009  . CAROTID ENDARTERECTOMY, LEFT, HX OF 02/03/2009  . INF&INFLAM REACT DUE CARD DEVICE IMPLANT&GRAFT 12/30/2008    Past Medical History:      Past Medical History:  Diagnosis Date  . Carotid artery disease ([place]) 1994   s/p left carotid endarerectomy   . Chronic atrial fibrillation    a. on coumadin   . Chronic diastolic CHF (congestive heart failure) ([place])   . Diabetes mellitus without complication ([place])    dx 2016  . Dyspnea   . Heart murmur   . Hx of CABG    a. 1994  . Hypertension   . OSA (obstructive sleep apnea)   . Rheumatic fever   . S/P AVR (aortic valve replacement)    a. mechanical valve 1996  . Subclavian bypass stenosis ([place])   . 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 Vitamin B12 deficiency    Past Surgical History:       Past Surgical History:  Procedure Laterality Date  . [place]   replaced due to aortic stenosis, [employer] mechanical prostesis  . CAROTID ENDARTERECTOMY Left 1997   subclavian bypass Done in [place]  . CORONARY ARTERY BYPASS GRAFT  1994   w SVG to RCA and SVG to circumflex  . heart bypass     Done in [place]  . MULTIPLE EXTRACTIONS WITH ALVEOLOPLASTY Bilateral 12/12/2017   Procedure: Extraction of tooth #'s 1, 12,13,14,15,17,18,19, 29, and 30 with alveoloplasty and gross debridement of remaining teeth`;  Surgeon: Lenn Cal, DDS;  Location: [facility];  Service: Oral Surgery;  Laterality: Bilateral;  MULTIPLE EXTRACTION WITH ALVEOLOPLASTY WITH PRE PROSTHETIC SURGERY  AND GROSS DEBRIDEMENT OF REMAINING TEETH  . TEE WITHOUT CARDIOVERSION N/A 10/23/2017   Procedure: TRANSESOPHAGEAL ECHOCARDIOGRAM (TEE);  Surgeon: Larey Dresser, MD;  [facility]COPY;  Service: Cardiovascular;  Laterality: N/A;  . TONSILLECTOMY      Assessment & Plan Clinical Impression:  82 y.o. M w/ a hx of mechanical aortic valve  (1994), OSA on CPAP, permanent A. fib, chronic anticoagulant therapy with Coumadin, chronic combined CHF, CKD stage III, DM, CAD with CABG 1994, COPD, severe mitral regurgitation, carotid stenosis with left carotid enterotomy, HTN, and HLD who was admitted with anemia to 5.6 due to left leg hematoma.    Pt presents with decreased activity tolerance, decreased functional mobility, decreased balance Limiting pt's independence with leisure/community pursuits.  Met with pt today to discuss TR services with emphasis on use of leisure time at discharge.  Pt describes frustration with multiple hospitalizations and is just looking forward to getting home and being home.  Pt discussed leisure interests and quality of life tasks. During conversation, talked through activity analysis with potential modifications for leisure participation at discharge.    Plan No further TR as pt is expected to discharge home on 10/31.  Recommendations for other services: Neuropsych  Discharge Criteria: Patient will be discharged from TR if patient refuses treatment 3 consecutive times without medical reason.  If treatment goals not met, if there is a change in medical status, if patient makes no progress towards goals or if patient is discharged from hospital.  The above assessment, treatment plan, treatment alternatives and goals were discussed and mutually agreed upon: by patient  [place] 01/23/2018, 2:30 PM

## 2018-01-23 NOTE — Progress Notes (Signed)
Social Work Patient ID: [name], male   DOB: 1936-01-16, 82 y.o.   MRN: [id number]  Met with pt and his wife who was here during lunch to discuss team conference goals-supervision level and target discharge date of 10/31. Both are pleased with with his progress and gains he has made since coming to the rehab unit. Pt wants to resume [facility] for home health services. Will make aware of discharge date. Encouraged wife to observe husband in therapies. Work toward discharge nest Thursday.

## 2018-01-23 NOTE — Plan of Care (Signed)
  Problem: Consults Goal: RH GENERAL PATIENT EDUCATION Description See Patient Education module for education specifics. Outcome: Progressing Goal: Skin Care Protocol Initiated - if Braden Score 18 or less Description If consults are not indicated, leave blank or document N/A Outcome: Progressing Goal: Nutrition Consult-if indicated Outcome: Progressing Goal: Diabetes Guidelines if Diabetic/Glucose > 140 Description If diabetic or lab glucose is > 140 mg/dl - Initiate Diabetes/Hyperglycemia Guidelines & Document Interventions  Outcome: Progressing   Problem: RH BOWEL ELIMINATION Goal: RH STG MANAGE BOWEL WITH ASSISTANCE Description STG Manage Bowel with min Assistance.  Outcome: Progressing Goal: RH STG MANAGE BOWEL W/MEDICATION W/ASSISTANCE Description STG Manage Bowel with Medication with min Assistance.  Outcome: Progressing   Problem: RH BLADDER ELIMINATION Goal: RH STG MANAGE BLADDER WITH ASSISTANCE Description STG Manage Bladder With min  Assistance  Outcome: Progressing Goal: RH STG MANAGE BLADDER WITH MEDICATION WITH ASSISTANCE Description STG Manage Bladder With Medication With min Assistance.  Outcome: Progressing   Problem: RH SKIN INTEGRITY Goal: RH STG SKIN FREE OF INFECTION/BREAKDOWN Outcome: Progressing Goal: RH STG MAINTAIN SKIN INTEGRITY WITH ASSISTANCE Description STG Maintain Skin Integrity With min  Assistance.  Outcome: Progressing Goal: RH STG ABLE TO PERFORM INCISION/WOUND CARE W/ASSISTANCE Description STG Able To Perform Incision/Wound Care With min Assistance.  Outcome: Progressing   Problem: RH SAFETY Goal: RH STG ADHERE TO SAFETY PRECAUTIONS W/ASSISTANCE/DEVICE Description STG Adhere to Safety Precautions With Assistance/Device. Outcome: Progressing Goal: RH STG DECREASED RISK OF FALL WITH ASSISTANCE Description STG Decreased Risk of Fall With Assistance. Outcome: Progressing   Problem: RH PAIN MANAGEMENT Goal: RH STG PAIN MANAGED  AT OR BELOW PT'S PAIN GOAL Outcome: Progressing   Problem: RH KNOWLEDGE DEFICIT GENERAL Goal: RH STG INCREASE KNOWLEDGE OF SELF CARE AFTER HOSPITALIZATION Outcome: Progressing

## 2018-01-23 NOTE — Progress Notes (Signed)
[facility] for warfarin Indication: atrial fibrillation and mechanical aortic valve  Labs: Recent Labs    01/21/18 0623 01/22/18 0455 01/23/18 0613  LABPROT 27.8* 28.1* 27.2*  INR 2.64 2.67 2.56  CREATININE 1.26* 1.52*  --     Estimated Creatinine Clearance: 45.1 mL/min (A) (by C-G formula based on SCr of 1.52 mg/dL (H)).   Assessment: Patient is an 82yoM on Coumadin 5mg  daily exc 7.5mg  MWF PTA for hx afib, mech valve. Bridged with Lovenox. Now INR is therapeutic at 2.56. From 10/19 Hgb 9.1, plts wnl. Some bleeding of gums and bruising on 10/20.  Goal of Therapy:  INR 2.5 - 3.5 Monitor platelets by anticoagulation protocol: Yes   Plan:  Give Coumadin 7.5mg  PO x 1 this morning Monitor daily INR, CBC, s/s of bleed  Thank you for allowing pharmacy to be a part of this patient's care.  Alycia Rossetti, PharmD, BCPS Clinical Pharmacist Pager: [phone] Clinical phone for 01/23/2018 from 7a-3:30p: [phone] If after 3:30p, please call main pharmacy at: [phone] Please check AMION for all [facility] numbers 01/23/2018 8:53 AM

## 2018-01-23 NOTE — Progress Notes (Signed)
[place] PHYSICAL MEDICINE & REHABILITATION PROGRESS NOTE   Subjective/Complaints:  Pt denies increased swelling in legs , no breathing issues  ROS: neg CP, SOB, N/V/D   Objective:   No results found. No results for input(s): WBC, HGB, HCT, PLT in the last 72 hours. Recent Labs    01/21/18 0623 01/22/18 0455  NA 135 132*  K 4.0 4.4  CL 95* 93*  CO2 32 29  GLUCOSE 112* 107*  BUN 37* 43*  CREATININE 1.26* 1.52*  CALCIUM 9.0 9.2    Intake/Output Summary (Last 24 hours) at 01/23/2018 0947 Last data filed at 01/23/2018 0944 Gross per 24 hour  Intake 580 ml  Output 2200 ml  Net -1620 ml     Physical Exam: Vital Signs Blood pressure (!) 104/56, pulse 71, temperature 97.6 F (36.4 C), resp. rate 18, weight 95.3 kg, SpO2 99 %.  General: No acute distress Mood and affect are appropriate Heart: Regular rate and rhythm no rubs, + diastolic murmurs or extra sounds Lungs: Clear to auscultation, breathing unlabored, no rales or wheezes Abdomen: Positive bowel sounds, soft nontender to palpation, nondistended Extremities: No clubbing, cyanosis, or edema Skin: No evidence of breakdown, no evidence of rash Neurologic: Cranial nerves II through XII intact, motor strength is 5/5 in bilateral deltoid, bicep, tricep, grip, hip flexor, knee extensors, ankle dorsiflexor and plantar flexor Sensory exam normal sensation to light touch and proprioception in bilateral upper and lower extremities Cerebellar exam normal finger to nose to finger as well as heel to shin in bilateral upper and lower extremities Musculoskeletal: Full range of motion in all 4 extremities. No joint swelling    Assessment/Plan: 1. Functional deficits secondary to debility following Left thigh hematoma which require 3+ hours per day of interdisciplinary therapy in a comprehensive inpatient rehab setting.  Physiatrist is providing close team supervision and 24 hour management of active medical problems listed  below.  Physiatrist and rehab team continue to assess barriers to discharge/monitor patient progress toward functional and medical goals Medical Problem List and Plan:  1. Functional and mobility deficits secondary to left thigh hematoma and debility  -Cont CIR PT, OT Team conference today please see physician documentation under team conference tab, met with team face-to-face to discuss problems,progress, and goals. Formulized individual treatment plan based on medical history, underlying problem and comorbidities. 2. DVT Prophylaxis/Anticoagulation: Pharmaceutical: Coumadin and INR therapeutic 2.56 3. Pain Management: Tylenol as needed  4. Mood: LCSW to follow for evaluation and support  5. Neuropsych: This patient is capable of making decisions on his own behalf.  6. Skin/Wound Care: Damp to dry dressing to  left thigh to remove fibronecrotic debris.  -Air mattress for pressure relief due to MAST with question blistering. Maintain adequate nutritional status.  7. Fluids/Electrolytes/Nutrition: Monitor I's and O's. Serial check of labs. Add protein supplements for protein calorie malnutrition  8. CAD s/p AVR/CAF/severe MR: On Coumadin, amiodarone and torsemide. Monitor heart rate twice daily  9. LLE hematoma with edema: Continue compressive right wraps as well as elevation.  10. ?T2DM:  CBG (last 3)  Recent Labs    01/22/18 1628 01/22/18 2146 01/23/18 0645  GLUCAP 106* 116* 106*  Controlled 10/23, hgb A1C 4.7 d/c CBG    11. Chronic diastolic CHF: Has been referred to CHF clinic recently and Oxygen dependent continue torsemide. He reports that Zaroxolyn on hold per cards.  -monitor daily weights   Filed Weights   01/22/18 0641 01/22/18 1530 01/23/18 0606  Weight: 95.3 kg 94.8  kg 95.3 kg   Elevated BUN and Creat likely due to demadex with poor po intake will reduce dose and monitor- discussed with cardiologycreat has been running around  1.5 Will check daily weight and adjust diuretic  based on this 12. ABLA: Continue to monitor with serial checks as INR therapeutic now  13. Hypokalemia: We will increase supplement to 3 times daily due to persistent hypokalemia recheck labs in a.m.  14. OSA: Wife to bring patient's machine from home to help compliance.  Care Tool:  Bathing     Body parts bathed by patient: Right arm, Left arm, Chest, Abdomen, Front perineal area, Right upper leg, Buttocks, Left upper leg, Face, Right lower leg, Left lower leg   Body parts bathed by helper: Right lower leg Body parts n/a: Right lower leg   Bathing assist Assist Level: Contact Guard/Touching assist     Upper Body Dressing/Undressing Upper body dressing   What is the patient wearing?: Pull over shirt    Upper body assist Assist Level: Set up assist    Lower Body Dressing/Undressing Lower body dressing      What is the patient wearing?: Underwear/pull up, Pants     Lower body assist Assist for lower body dressing: Contact Guard/Touching assist     Toileting Toileting    Toileting assist Assist for toileting: Minimal Assistance - Patient > 75% Assistive Device Comment: (urinal/bedpan not assistive, clothing manage/hygiene only)   Transfers Chair/bed transfer  Transfers assist     Chair/bed transfer assist level: Minimal Assistance - Patient > 75%     Locomotion Ambulation   Ambulation assist      Assist level: Contact Guard/Touching assist Assistive device: Walker-rolling Max distance: 60'   Walk 10 feet activity   Assist     Assist level: Contact Guard/Touching assist Assistive device: Walker-rolling   Walk 50 feet activity   Assist Walk 50 feet with 2 turns activity did not occur: Safety/medical concerns  Assist level: Contact Guard/Touching assist Assistive device: Walker-rolling    Walk 150 feet activity   Assist Walk 150 feet activity did not occur: Safety/medical concerns         Walk 10 feet on uneven surface   activity   Assist Walk 10 feet on uneven surfaces activity did not occur: Safety/medical concerns         Wheelchair     Assist Will patient use wheelchair at discharge?: No Type of Wheelchair: Manual    Wheelchair assist level: Supervision/Verbal cueing Max wheelchair distance: 100    Wheelchair 50 feet with 2 turns activity    Assist        Assist Level: Supervision/Verbal cueing   Wheelchair 150 feet activity     Assist              LOS: 5 days A FACE TO FACE EVALUATION WAS PERFORMED  Charlett Blake 01/23/2018, 9:47 AM

## 2018-01-23 NOTE — Progress Notes (Signed)
Physical Therapy Session Note  Patient Details  Name: [name] MRN: [id number] Date of Birth: [birth year]  Today's Date: 01/23/2018 PT Individual Time: 6644-0347 and 4259-5638 PT Individual Time Calculation (min): 42 min and 55 min  Short Term Goals: Week 1:  PT Short Term Goal 1 (Week 1): Pt will ambulate x 50 ft with LRAD and min A PT Short Term Goal 2 (Week 1): Pt will perform sit to stand with min A consistently PT Short Term Goal 3 (Week 1): Pt will initiate curb step training  Skilled Therapeutic Interventions/Progress Updates:    Session 1: Pt seated in w/c upon PT arrival, agreeable to therapy tx and denies pain. Trial with rollator this session for ambulation, therapist adjusted height of rollator for improved fit and posture for patient. Pt ambulated from room>gym with rollator and min assist x 90 ft and x 60 ft with one seated rest break on the way, pt requiring min-mod assist to help with rollator management when turning to sit. From elevated mat pt performed 2 x 5 sit<>stands, pushing up with UEs to stand and then no UE support with sitting, emphasis on eccentric control and anterior weightshift. Pt worked on standing balance activity to perform card matching task without UE support min assist, difficulty reaching overhead secondary to limited active shoulder flexion. Therapist performed manual shoulder flexion PROM for stretching. Pt performed x 10 L LE heel slides working on knee flexion ROM. Pt ambulated back to room with rollator and min assist, left seated in w/c with needs in reach and chair alarm set.   Session 2: Pt seated in w/c upon PT arrival, agreeable to therapy tx and denies pain. Pt donned shoes while seated in w/c, assist to tie. Pt propelled w/c from room>dayroom x 150 ft with supervision using B UEs, working on endurance. Pt performed stand pivot w/c<>nustep mod assist without AD, increased fear of falling. Pt used nustep x 8 minutes on workload 3 using LEs only for  ROM. Pt propelled w/c to gym with supervision x 150 ft. Pt ambulated 2 x 80 ft this session with rollator and min assist working on endurance and activity tolerance. Pt performed seated therex for strengthening 2 x 10 each bilaterally: heel slides, LAQ,hip flexion. Pt worked on dynamic standing balance to perform horseshoe toss, min assist. With fatigue at end of session pt requiring mod assist for sit<>stand from the mat, ambulated x 40 ft and then propelled w/c back to room. Pt left seated in w/c with needs in reach and chair alarm set.  Therapy Documentation Precautions:  Precautions Precautions: Fall Restrictions Weight Bearing Restrictions: No Other Position/Activity Restrictions: WBAT LLE    Therapy/Group: Individual Therapy  Netta Corrigan, PT, DPT 01/23/2018, 7:51 AM

## 2018-01-23 NOTE — Patient Care Conference (Signed)
Inpatient RehabilitationTeam Conference and Plan of Care Update Date: 01/23/2018   Time: 11:20 AM    Patient Name: [name]      Medical Record Number: [id number]  Date of Birth: [birth year] Sex: Male         Room/Bed: 4W20C/4W20C-01 Payor Info: Payor: MEDICARE / Plan: MEDICARE PART A AND B / Product Type: *No Product type* /    Admitting Diagnosis: debility  Admit Date/Time:  01/18/2018 11:25 AM Admission Comments: No comment available   Primary Diagnosis:  <principal problem not specified> Principal Problem: <principal problem not specified>  Patient Active Problem List   Diagnosis Date Noted  . Debility 01/18/2018  . Anemia 01/09/2018  . Leg pain, anterior, left 01/07/2018  . Dysuria 01/07/2018  . Severe anemia 12/23/2017  . Supratherapeutic INR 12/23/2017  . Hyponatremia 12/23/2017  . Coronary artery disease with history of myocardial infarction without history of CABG 12/23/2017  . Bleeding from mouth 12/20/2017  . Post-op pain 12/12/2017  . Dental caries 12/12/2017  . Severe mitral regurgitation 11/14/2017  . Pressure injury of skin 10/29/2017  . Shortness of breath   . Palliative care by specialist   . RVF (right ventricular failure) ([place])   . Right heart failure ([place]) 10/18/2017  . Hypoxia   . COPD exacerbation ([place]) 08/01/2017  . Pancytopenia ([place]) 08/01/2017  . Epistaxis 07/11/2017  . Actinic keratoses 04/05/2017  . Leg wound, right 02/18/2016  . Edema 02/18/2016  . Chronic anticoagulation 02/18/2016  . Hyperkalemia 07/25/2015  . Atherosclerosis of native arteries of extremity with intermittent claudication ([place]) 10/21/2014  . Thrombocytopenia ([place]) 10/20/2014  . Macrocytosis without anemia 10/20/2014  . OSA (obstructive sleep apnea) 05/15/2013  . Goals of care, counseling/discussion 05/01/2013  . History of mechanical aortic valve replacement 01/02/2013  . Obesity (BMI 30-39.9) 10/20/2012  . Type II diabetes mellitus with complication ([place]) 55/73/2202   . Acute asthmatic bronchitis 01/18/2012  . ACUTE KIDNEY FAILURE UNSPECIFIED 02/04/2009  . CUTANEOUS ERUPTIONS, DRUG-INDUCED 02/04/2009  . HLD (hyperlipidemia) 02/03/2009  . Essential hypertension 02/03/2009  . Coronary atherosclerosis 02/03/2009  . Atrial fibrillation ([place]) 02/03/2009  . Chronic diastolic heart failure ([place]) 02/03/2009  . CAROTID ENDARTERECTOMY, LEFT, HX OF 02/03/2009  . INF&INFLAM REACT DUE CARD DEVICE IMPLANT&GRAFT 12/30/2008    Expected Discharge Date: Expected Discharge Date: 01/31/18  Team Members Present: Physician leading conference: Dr. Alysia Penna Social Worker Present: Ovidio Kin, LCSW Nurse Present: Dorien Chihuahua, RN PT Present: Michaelene Song, PT OT Present: Willeen Cass, OT;Roanna Epley, COTA SLP Present: Windell Moulding, SLP PPS Coordinator present : Daiva Nakayama, RN, CRRN     Current Status/Progress Goal Weekly Team Focus  Medical   Renal function getting dry, blood pressure on lower side, Demadex has been reduced,  Maintain medical stability monitor for weight gain  Medication management for CHF, initiating rehab program   Bowel/Bladder   Continent of B/B  remain continent of B/B  Remain continent of B/B normal bowel pattern LBM 10/22   Swallow/Nutrition/ Hydration             ADL's   bathing-min A; LB dressing-min A; toileting-steady A; sit<>stand-min A; standing balance-steady A; fatigues quickly  independent with assistive device overall; supervision for bathing   activity tolerance, sit<>stand, standing balance, BADL retraining, safety awareness   Mobility   CGA-min assist short distance gait w/ RW and stand pivot transfers, limited by fatigue   independent w/ AD  functional strength and endurance, LLE ROM and pain management   Communication  Safety/Cognition/ Behavioral Observations            Pain             Skin                *See Care Plan and progress notes for long and short-term goals.     Barriers to  Discharge  Current Status/Progress Possible Resolutions Date Resolved   Physician    Medical stability     Progressing towards goals  Continue rehab program      Nursing                  PT  Medical stability                 OT                  SLP                SW                Discharge Planning/Teaching Needs:  Home with wife who can provdie superivison to pt. Pt is making good progress and moving well in therapies.      Team Discussion:  MD monitoring chronic medical issues-CHF and labs. Making good gains in therapies-with endurance, strengthening and activity tolerance. Speech not following. Goals mod/i level. Wife has been in to observe in therapies. Neuro-psych seeing for support today.  Revisions to Treatment Plan:  DC 10/31    Continued Need for Acute Rehabilitation Level of Care: The patient requires daily medical management by a physician with specialized training in physical medicine and rehabilitation for the following conditions: Daily direction of a multidisciplinary physical rehabilitation program to ensure safe treatment while eliciting the highest outcome that is of practical value to the patient.: Yes Daily medical management of patient stability for increased activity during participation in an intensive rehabilitation regime.: Yes Daily analysis of laboratory values and/or radiology reports with any subsequent need for medication adjustment of medical intervention for : Neurological problems   I attest that I was present, lead the team conference, and concur with the assessment and plan of the team.   Elease Hashimoto 01/23/2018, 11:32 AM

## 2018-01-23 NOTE — Progress Notes (Signed)
Occupational Therapy Session Note  Patient Details  Name: [name] MRN: [id number] Date of Birth: [birth year]  Today's Date: 01/23/2018 OT Individual Time: 2707-8675 OT Individual Time Calculation (min): 85 min    Short Term Goals: Week 1:  OT Short Term Goal 1 (Week 1): Pt will sit to stand wiht min A in prep for LB dressing OT Short Term Goal 2 (Week 1): Pt will complete BSC/toilet transfer wiht Min A OT Short Term Goal 3 (Week 1): Pt will thread BLE into pants with AE PRN and supervision OT Short Term Goal 4 (Week 1): Pt will complete clothing managment prior to toileting with CGA  Skilled Therapeutic Interventions/Progress Updates:    OT intervention with focus on bed mobility, sit<>stand, standing balance, functional amb with RW, activity tolerance, and safety awareness to increase independence with BADLs. Pt resting in bed upon arrival and came to EOB without assistance.  Pt performed sit<>stand from EOB with contact guard and amb with RW to bathroom to use toilet prior to transferring to tub bench in shower.  Pt required min A for sit<>stand from [facility] over toilet and from tub bench in shower.  Pt required assistance with donning Ted Hose and non-slip socks but completed all other bathing and dressing tasks with contact guard only. Pt fatigues quickly and requires multiple rest breaks throughout session. Pt remained seated in w/c with all needs within reach.   Therapy Documentation Precautions:  Precautions Precautions: Fall Restrictions Weight Bearing Restrictions: No Other Position/Activity Restrictions: WBAT LLE Pain: Pain Assessment Pain Scale: 0-10 Pain Score: 0-No pain   Therapy/Group: Individual Therapy  [name] 01/23/2018, 9:58 AM

## 2018-01-24 ENCOUNTER — Inpatient Hospital Stay (HOSPITAL_COMMUNITY): Payer: Self-pay | Admitting: Physical Therapy

## 2018-01-24 ENCOUNTER — Inpatient Hospital Stay (HOSPITAL_COMMUNITY): Payer: Self-pay

## 2018-01-24 LAB — PROTIME-INR
INR: 2.49
Prothrombin Time: 26.6 seconds — ABNORMAL HIGH (ref 11.4–15.2)

## 2018-01-24 LAB — GLUCOSE, CAPILLARY: Glucose-Capillary: 101 mg/dL — ABNORMAL HIGH (ref 70–99)

## 2018-01-24 MED ORDER — WARFARIN SODIUM 7.5 MG PO TABS
7.5000 mg | ORAL_TABLET | Freq: Once | ORAL | Status: AC
  Administered 2018-01-24: 7.5 mg via ORAL
  Filled 2018-01-24: qty 1

## 2018-01-24 NOTE — Progress Notes (Signed)
Occupational Therapy Session Note  Patient Details  Name: Burnette Sautter MRN: [id number] Date of Birth: 30-Sep-1935  Today's Date: 01/24/2018 OT Individual Time: 6237-6283 OT Individual Time Calculation (min): 85 min    Short Term Goals: Week 1:  OT Short Term Goal 1 (Week 1): Pt will sit to stand wiht min A in prep for LB dressing OT Short Term Goal 2 (Week 1): Pt will complete BSC/toilet transfer wiht Min A OT Short Term Goal 3 (Week 1): Pt will thread BLE into pants with AE PRN and supervision OT Short Term Goal 4 (Week 1): Pt will complete clothing managment prior to toileting with CGA  Skilled Therapeutic Interventions/Progress Updates:    OT intervention with focus on BADL retaining including bathing at shower level, dressing with sit<>stand from w/c, toileting, functional transfers with rollator, standing balance, BUE therex,activity tolerance, and safety awareness to increase independence with BADLs. Pt resting in bed upon arrival and sat EOB with supervision in preparation for amb with Rollator to bathroom for shower.  Pt returned to room and while standing at sink to clean buttocks, pt requested to use toilet.  Pt completed toileting tasks at supervision level.  Pt required assistance with donning L sock but completed all other dressing tasks with contact guard when standing.  Pt noted with limited BUE/shoulder AROM but PROM to ~135 with increase scapula winging.  Pt educated in [place] therex, first with theraband with limited success.  Pt educated with shoulder flexion exercises with 1kg ball with increased success. Pt instructed to perform exercises 3-4 times each day (3 sets of 8). Pt remained in w/c with all needs within reach.   Therapy Documentation Precautions:  Precautions Precautions: Fall Restrictions Weight Bearing Restrictions: No Other Position/Activity Restrictions: WBAT LLE    Pain: Pt c/o 4/10 L upper leg; repositioned    Therapy/Group: Individual  Therapy  [name] 01/24/2018, 9:45 AM

## 2018-01-24 NOTE — Progress Notes (Signed)
[place] PHYSICAL MEDICINE & REHABILITATION PROGRESS NOTE   Subjective/Complaints:  No issues overnite, wore CPAP, freq urination at noc  ROS: neg CP, SOB, N/V/D   Objective:   No results found. No results for input(s): WBC, HGB, HCT, PLT in the last 72 hours. Recent Labs    01/22/18 0455  NA 132*  K 4.4  CL 93*  CO2 29  GLUCOSE 107*  BUN 43*  CREATININE 1.52*  CALCIUM 9.2    Intake/Output Summary (Last 24 hours) at 01/24/2018 0819 Last data filed at 01/24/2018 0737 Gross per 24 hour  Intake 240 ml  Output 1850 ml  Net -1610 ml     Physical Exam: Vital Signs Blood pressure 92/60, pulse 70, temperature 98.3 F (36.8 C), resp. rate 19, weight 95.3 kg, SpO2 100 %.  General: No acute distress Mood and affect are appropriate Heart: Regular rate and rhythm no rubs, + diastolic murmurs or extra sounds Lungs: Clear to auscultation, breathing unlabored, no rales or wheezes Abdomen: Positive bowel sounds, soft nontender to palpation, nondistended Extremities: No clubbing, cyanosis, or edema Skin: No evidence of breakdown, no evidence of rash Neurologic: Cranial nerves II through XII intact, motor strength is 5/5 in bilateral deltoid, bicep, tricep, grip, hip flexor, knee extensors, ankle dorsiflexor and plantar flexor Sensory exam normal sensation to light touch and proprioception in bilateral upper and lower extremities Cerebellar exam normal finger to nose to finger as well as heel to shin in bilateral upper and lower extremities Musculoskeletal: Full range of motion in all 4 extremities. No joint swelling    Assessment/Plan: 1. Functional deficits secondary to debility following Left thigh hematoma which require 3+ hours per day of interdisciplinary therapy in a comprehensive inpatient rehab setting.  Physiatrist is providing close team supervision and 24 hour management of active medical problems listed below.  Physiatrist and rehab team continue to assess  barriers to discharge/monitor patient progress toward functional and medical goals Medical Problem List and Plan:  1. Functional and mobility deficits secondary to left thigh hematoma and debility  -Cont CIR PT, OT  2. DVT Prophylaxis/Anticoagulation: Pharmaceutical: Coumadin and INR therapeutic 2.56 3. Pain Management: Tylenol as needed  4. Mood: LCSW to follow for evaluation and support  5. Neuropsych: This patient is capable of making decisions on his own behalf.  6. Skin/Wound Care: Damp to dry dressing to  left thigh to remove fibronecrotic debris.  -Air mattress for pressure relief due to MAST with question blistering. Maintain adequate nutritional status.  7. Fluids/Electrolytes/Nutrition: Monitor I's and O's. Serial check of labs. Add protein supplements for protein calorie malnutrition  Po intake low 441ml on 10/23 8. CAD s/p AVR/CAF/severe MR: On Coumadin, amiodarone and torsemide. Monitor heart rate twice daily  9. LLE hematoma with edema: Continue compressive right wraps as well as elevation.  10. ?T2DM:  CBG (last 3)  Recent Labs    01/23/18 1708 01/23/18 2156 01/24/18 0632  GLUCAP 112* 96 101*  Controlled 10/24, hgb A1C 4.7 d/c CBG    11. Chronic diastolic CHF: Has been referred to CHF clinic recently and Oxygen dependent continue torsemide. He reports that Zaroxolyn on hold per cards.  -monitor daily weights No change on lower dose of torsemide,   Filed Weights   01/22/18 0641 01/22/18 1530 01/23/18 0606  Weight: 95.3 kg 94.8 kg 95.3 kg   Elevated BUN and Creat likely due to demadex with poor po intake will reduce dose and monitor- discussed with cardiologycreat has been running around  1.5 Will check daily weight and adjust diuretic based on this 12. ABLA: Continue to monitor with serial checks as INR therapeutic now  13. Hypokalemia: We will increase supplement to 3 times daily due to persistent hypokalemia recheck labs in a.m.  14. OSA: Wife to bring patient's  machine from home to help compliance.  Care Tool:  Bathing     Body parts bathed by patient: Right arm, Left arm, Chest, Abdomen, Front perineal area, Right upper leg, Buttocks, Left upper leg, Face, Right lower leg, Left lower leg   Body parts bathed by helper: Right lower leg Body parts n/a: Right lower leg   Bathing assist Assist Level: Contact Guard/Touching assist     Upper Body Dressing/Undressing Upper body dressing   What is the patient wearing?: Pull over shirt    Upper body assist Assist Level: Set up assist    Lower Body Dressing/Undressing Lower body dressing      What is the patient wearing?: Pants, Incontinence brief     Lower body assist Assist for lower body dressing: Contact Guard/Touching assist     Toileting Toileting    Toileting assist Assist for toileting: Minimal Assistance - Patient > 75% Assistive Device Comment: (emptied urinal for pt)   Transfers Chair/bed transfer  Transfers assist     Chair/bed transfer assist level: Minimal Assistance - Patient > 75%     Locomotion Ambulation   Ambulation assist      Assist level: Minimal Assistance - Patient > 75% Assistive device: Rollator Max distance: 90 ft   Walk 10 feet activity   Assist     Assist level: Minimal Assistance - Patient > 75% Assistive device: Rollator   Walk 50 feet activity   Assist Walk 50 feet with 2 turns activity did not occur: Safety/medical concerns  Assist level: Minimal Assistance - Patient > 75% Assistive device: Rollator    Walk 150 feet activity   Assist Walk 150 feet activity did not occur: Safety/medical concerns         Walk 10 feet on uneven surface  activity   Assist Walk 10 feet on uneven surfaces activity did not occur: Safety/medical concerns         Wheelchair     Assist Will patient use wheelchair at discharge?: No Type of Wheelchair: Manual    Wheelchair assist level: Supervision/Verbal cueing Max wheelchair  distance: 100    Wheelchair 50 feet with 2 turns activity    Assist        Assist Level: Supervision/Verbal cueing   Wheelchair 150 feet activity     Assist              LOS: 6 days A FACE TO FACE EVALUATION WAS PERFORMED  Charlett Blake 01/24/2018, 8:19 AM

## 2018-01-24 NOTE — Progress Notes (Signed)
RT NOTE:  Home CPAP setup @ bedside. Pt stated his nurse usually hands him the mask when he is ready. Sterile water added.

## 2018-01-24 NOTE — Progress Notes (Signed)
[facility] for warfarin Indication: atrial fibrillation and mechanical aortic valve  Labs: Recent Labs    01/22/18 0455 01/23/18 0613 01/24/18 0522  LABPROT 28.1* 27.2* 26.6*  INR 2.67 2.56 2.49  CREATININE 1.52*  --   --     Estimated Creatinine Clearance: 45.1 mL/min (A) (by C-G formula based on SCr of 1.52 mg/dL (H)).   Assessment: Patient is an 82yoM on Coumadin 5mg  daily exc 7.5mg  MWF PTA for hx afib, mech valve. Bridged with Lovenox. Now INR is therapeutic at 2.56. From 10/19 Hgb 9.1, plts wnl. Some bleeding of gums and bruising on 10/20.  Goal of Therapy:  INR 2.5 - 3.5 Monitor platelets by anticoagulation protocol: Yes   Plan:  Give Coumadin 7.5mg  PO x 1 this morning Monitor daily INR, CBC, s/s of bleed  Thank you for allowing pharmacy to be a part of this patient's care.  [name], PharmD, BCPS Clinical Pharmacist Phone number [phone] 01/24/2018 8:05 AM

## 2018-01-24 NOTE — Plan of Care (Signed)
  Problem: Consults Goal: RH GENERAL PATIENT EDUCATION Description See Patient Education module for education specifics. Outcome: Progressing Goal: Skin Care Protocol Initiated - if Braden Score 18 or less Description If consults are not indicated, leave blank or document N/A Outcome: Progressing Goal: Nutrition Consult-if indicated Outcome: Progressing Goal: Diabetes Guidelines if Diabetic/Glucose > 140 Description If diabetic or lab glucose is > 140 mg/dl - Initiate Diabetes/Hyperglycemia Guidelines & Document Interventions  Outcome: Progressing   Problem: RH BOWEL ELIMINATION Goal: RH STG MANAGE BOWEL WITH ASSISTANCE Description STG Manage Bowel with min Assistance.  Outcome: Progressing Goal: RH STG MANAGE BOWEL W/MEDICATION W/ASSISTANCE Description STG Manage Bowel with Medication with min Assistance.  Outcome: Progressing   Problem: RH BLADDER ELIMINATION Goal: RH STG MANAGE BLADDER WITH ASSISTANCE Description STG Manage Bladder With min  Assistance  Outcome: Progressing Goal: RH STG MANAGE BLADDER WITH MEDICATION WITH ASSISTANCE Description STG Manage Bladder With Medication With min Assistance.  Outcome: Progressing   Problem: RH SKIN INTEGRITY Goal: RH STG SKIN FREE OF INFECTION/BREAKDOWN Outcome: Progressing Goal: RH STG MAINTAIN SKIN INTEGRITY WITH ASSISTANCE Description STG Maintain Skin Integrity With min  Assistance.  Outcome: Progressing Goal: RH STG ABLE TO PERFORM INCISION/WOUND CARE W/ASSISTANCE Description STG Able To Perform Incision/Wound Care With min Assistance.  Outcome: Progressing   Problem: RH SAFETY Goal: RH STG ADHERE TO SAFETY PRECAUTIONS W/ASSISTANCE/DEVICE Description STG Adhere to Safety Precautions With Assistance/Device. Outcome: Progressing Goal: RH STG DECREASED RISK OF FALL WITH ASSISTANCE Description STG Decreased Risk of Fall With Assistance. Outcome: Progressing   Problem: RH PAIN MANAGEMENT Goal: RH STG PAIN MANAGED  AT OR BELOW PT'S PAIN GOAL Outcome: Progressing   Problem: RH KNOWLEDGE DEFICIT GENERAL Goal: RH STG INCREASE KNOWLEDGE OF SELF CARE AFTER HOSPITALIZATION Outcome: Progressing

## 2018-01-24 NOTE — Progress Notes (Signed)
Physical Therapy Session Note  Patient Details  Name: [name] MRN: [id number] Date of Birth: 1935/11/28  Today's Date: 01/24/2018 PT Individual Time:1100-1155 AND 1300-1400 PT Individual Time Calculation (min): 55 min AND 60 min   Short Term Goals: Week 1:  PT Short Term Goal 1 (Week 1): Pt will ambulate x 50 ft with LRAD and min A PT Short Term Goal 2 (Week 1): Pt will perform sit to stand with min A consistently PT Short Term Goal 3 (Week 1): Pt will initiate curb step training  Skilled Therapeutic Interventions/Progress Updates:   Session 1:  Pt in w/c and agreeable to therapy, no c/o pain throughout session. Pt self-propelled w/c to/from therapy gym for UE strengthening and endurance training. Session focused on gait training w/ RW and overall endurance and global strengthening. Ambulated 100' x4 reps w/ rollator and CGA to work on functional endurance. Turned to sit on rollator x2 w/ no cues needed throughout session for rollator safety and brake management. Worked on single leg stance in w/o UE support while performing alternating forward and backward stepping. Emphasis on maintaining L knee control in single leg stance. Returned to room via w/c, ended session in w/c and all needs met.   Session 2:  Pt in w/c and agreeable to therapy, no c/o pain. Pt self-propelled w/c to/from therapy gym w/ BUEs for UE strengthening and endurance training. Performed NuStep 10 min @ level 4 for global endurance training w/ verbal cues for neutral LE alignment. Performed LE strengthening exercises w/ rollator support including multiple sit<>stands and partial knee bends 3x10. Pt reporting needing to toilet. Returned to room and ambulated to/from toilet w/ CGA, close supervision while pt performed LE garment management and pericare. Pants wet from voiding, min assist to change while seated at side of bed. Ended session in supine, all needs in reach.   Therapy Documentation Precautions:   Precautions Precautions: Fall Restrictions Weight Bearing Restrictions: No Other Position/Activity Restrictions: WBAT LLE Vital Signs: Therapy Vitals Temp: 98.7 F (37.1 C) Pulse Rate: 70 Resp: 16 BP: (!) 105/54(RN notified) Patient Position (if appropriate): Lying Oxygen Therapy SpO2: 98 % O2 Device: Room Air  Therapy/Group: Individual Therapy  Gessica Jawad [name] 01/24/2018, 4:47 PM

## 2018-01-24 NOTE — Progress Notes (Signed)
Pt on home CPAP at time of check and tolerating well.

## 2018-01-25 ENCOUNTER — Inpatient Hospital Stay (HOSPITAL_COMMUNITY): Payer: Self-pay

## 2018-01-25 ENCOUNTER — Inpatient Hospital Stay (HOSPITAL_COMMUNITY): Payer: Self-pay | Admitting: Physical Therapy

## 2018-01-25 LAB — URINALYSIS, ROUTINE W REFLEX MICROSCOPIC
Bilirubin Urine: NEGATIVE
Glucose, UA: NEGATIVE mg/dL
Ketones, ur: NEGATIVE mg/dL
NITRITE: NEGATIVE
PH: 6 (ref 5.0–8.0)
Protein, ur: NEGATIVE mg/dL
SPECIFIC GRAVITY, URINE: 1.009 (ref 1.005–1.030)
WBC, UA: >50 WBC/hpf — ABNORMAL HIGH (ref 0–5)

## 2018-01-25 LAB — BASIC METABOLIC PANEL
ANION GAP: 10 (ref 5–15)
BUN: 48 mg/dL — ABNORMAL HIGH (ref 8–23)
CO2: 29 mmol/L (ref 22–32)
Calcium: 8.8 mg/dL — ABNORMAL LOW (ref 8.9–10.3)
Chloride: 96 mmol/L — ABNORMAL LOW (ref 98–111)
Creatinine, Ser: 1.35 mg/dL — ABNORMAL HIGH (ref 0.61–1.24)
GFR, EST AFRICAN AMERICAN: 56 mL/min — AB (ref >60)
GFR, EST NON AFRICAN AMERICAN: 48 mL/min — AB (ref >60)
Glucose, Bld: 106 mg/dL — ABNORMAL HIGH (ref 70–99)
POTASSIUM: 3.5 mmol/L (ref 3.5–5.1)
SODIUM: 135 mmol/L (ref 135–145)

## 2018-01-25 LAB — PROTIME-INR
INR: 2.67
Prothrombin Time: 28 seconds — ABNORMAL HIGH (ref 11.4–15.2)

## 2018-01-25 MED ORDER — WARFARIN SODIUM 5 MG PO TABS
5.0000 mg | ORAL_TABLET | Freq: Once | ORAL | Status: AC
  Administered 2018-01-25: 5 mg via ORAL
  Filled 2018-01-25: qty 1

## 2018-01-25 MED ORDER — POTASSIUM CHLORIDE CRYS ER 20 MEQ PO TBCR
40.0000 meq | EXTENDED_RELEASE_TABLET | Freq: Two times a day (BID) | ORAL | Status: DC
  Administered 2018-01-25 – 2018-01-31 (×12): 40 meq via ORAL
  Filled 2018-01-25 (×4): qty 2
  Filled 2018-01-25: qty 4
  Filled 2018-01-25 (×7): qty 2

## 2018-01-25 NOTE — Progress Notes (Signed)
Occupational Therapy Session Note  Patient Details  Name: [name] MRN: [id number] Date of Birth: [birth year]  Today's Date: 01/25/2018 OT Individual Time: 1300-1330 OT Individual Time Calculation (min): 30 min    Short Term Goals: Week 2:  OT Short Term Goal 1 (Week 2): STG=LTG secondary to ELOS  Skilled Therapeutic Interventions/Progress Updates:    OT intervention with focus on functional amb with Rollator, toileting, standing balance, and therex to increase independence with BADLs and activity tolerance.  Pt amb with Rollator in/out of bathroom for use of toilet.  Pt completed toileting tasks with CGA.  Pt amb to day room and engaged in BLE/BUE therex on NuStep (level 4 for 8 mins). Pt returned to room and remained in w/c with all needs within reach, chair alarm activated, and wife present.    Therapy Documentation Precautions:  Precautions Precautions: Fall Restrictions Weight Bearing Restrictions: No Other Position/Activity Restrictions: WBAT LLE Pain:  Pt c/o LLE discomfort but no pain; repositioned   Therapy/Group: Individual Therapy  [name] 01/25/2018, 3:00 PM

## 2018-01-25 NOTE — Progress Notes (Signed)
Occupational Therapy Weekly Progress Note  Patient Details  Name: [name] MRN: [id number] Date of Birth: 1935-11-02  Beginning of progress report period: January 19, 2018 End of progress report period: January 25, 2018  Patient has met 4 of 4 short term goals.  Pt has made steady progress with BADLs and functional transfers since admission.  Pt requires steady A for standing balance during LB dressing tasks and toileting tasks.  Pt requires CGA for amb with Rollator for toilet transfers and shower transfers.  Pt requires more than a reasonable amount of time for tasks with multiple rest breaks.  Family has not been present for therapy Patient continues to demonstrate the following deficits: muscle weakness, decreased cardiorespiratoy endurance and decreased standing balance and decreased balance strategies and therefore will continue to benefit from skilled OT intervention to enhance overall performance with BADL.  Patient progressing toward long term goals..  Continue plan of care.  OT Short Term Goals Week 1:  OT Short Term Goal 1 (Week 1): Pt will sit to stand wiht min A in prep for LB dressing OT Short Term Goal 1 - Progress (Week 1): Met OT Short Term Goal 2 (Week 1): Pt will complete BSC/toilet transfer wiht Min A OT Short Term Goal 2 - Progress (Week 1): Met OT Short Term Goal 3 (Week 1): Pt will thread BLE into pants with AE PRN and supervision OT Short Term Goal 3 - Progress (Week 1): Met OT Short Term Goal 4 (Week 1): Pt will complete clothing managment prior to toileting with CGA OT Short Term Goal 4 - Progress (Week 1): Met Week 2:  OT Short Term Goal 1 (Week 2): STG=LTG secondary to ELOS     Therapy Documentation Precautions:  Precautions Precautions: Fall Restrictions Weight Bearing Restrictions: No Other Position/Activity Restrictions: WBAT LLE   ADL: ADL Eating: Independent Grooming: Setup Where Assessed-Grooming: Sitting at sink Upper Body Bathing:  Setup Where Assessed-Upper Body Bathing: Shower Lower Body Bathing: Minimal assistance Where Assessed-Lower Body Bathing: Shower Upper Body Dressing: Setup Where Assessed-Upper Body Dressing: Sitting at sink Lower Body Dressing: Moderate assistance Where Assessed-Lower Body Dressing: Sitting at sink Toileting: Minimal assistance Where Assessed-Toileting: Glass blower/designer: Therapist, music Method: Counselling psychologist: Bedside commode, Other (comment)(RW) Social research officer, government: Curator Method: Heritage manager: Civil engineer, contracting with back   Other Treatments:      [name] 01/25/2018, 7:03 AM

## 2018-01-25 NOTE — Progress Notes (Signed)
Physical Therapy Weekly Progress Note  Patient Details  Name: [name] MRN: [id number] Date of Birth: 05-27-1935  Beginning of progress report period: January 19, 2018 End of progress report period: January 25, 2018  Today's Date: 01/25/2018 PT Individual Time: 1410-3013 PT Individual Time Calculation (min): 69 min   Patient has met 3 of 3 short term goals. Pt is progressing well w/ therapies, performing mobility w/ close supervision to [facility] for safety. He is consistently ambulating household distances w/ rollator and supervision, however continues to require heavy UE support for dynamic standing balance tasks. He remains limited by LLE stiffness and decreased ROM, primarily in the knee.   Patient continues to demonstrate the following deficits muscle weakness and muscle joint tightness, decreased cardiorespiratoy endurance and decreased oxygen support and decreased balance strategies and therefore will continue to benefit from skilled PT intervention to increase functional independence with mobility.  Patient progressing toward long term goals..  Continue plan of care.  PT Short Term Goals Week 1:  PT Short Term Goal 1 (Week 1): Pt will ambulate x 50 ft with LRAD and min A PT Short Term Goal 1 - Progress (Week 1): Met PT Short Term Goal 2 (Week 1): Pt will perform sit to stand with min A consistently PT Short Term Goal 2 - Progress (Week 1): Met PT Short Term Goal 3 (Week 1): Pt will initiate curb step training PT Short Term Goal 3 - Progress (Week 1): Met Week 2:  PT Short Term Goal 1 (Week 2): =LTGs due to ELOS  Skilled Therapeutic Interventions/Progress Updates:   Pt in w/c and agreeable to therapy, denies pain but reports increased L knee stiffness this session. Ambulated around unit w/ close supervision using rollator, 100-150' bouts w/ rest breaks in between. Performed NuStep 7 min @ level 2 for LE strengthening and gentle LLE ROM. Returned to room at this point as pt needed to  toilet, close supervision for LE garment management and for toilet transfer. Returned to gym and work on Psychologist, counselling. Pt demonstrated how he usually negotiates 4" curb w/ rollator which he states is a similar height to what he has to enter his house. CGA for safety, otherwise pt safely putting rollator wheels on/off curb front set and then back set. Pt also appropriately using breaks for safety when necessary. Finished session by working on LE strengthening w/ sit<>stands w/o UE support from elevated surface. Required very high surface before pt was successful w/ CGA from therapist. Performed sets of 5 at progressively lower surface, started at 27" and ended at 25". Pt very fearful to attempt w/o UE assist, required max encouragement and verbal cues for technique at first. Static knee flexion and extension stretching in between standing bouts, 30 sec hold x4 reps. Returned to room and ended session in w/c, all needs in reach.   Therapy Documentation Precautions:  Precautions Precautions: Fall Restrictions Weight Bearing Restrictions: No Other Position/Activity Restrictions: WBAT LLE Pain: Pain Assessment Pain Scale: 0-10 Pain Score: 0-No pain  Therapy/Group: Individual Therapy  Tavarus Poteete K Shiann Kam 01/25/2018, 11:56 AM

## 2018-01-25 NOTE — Progress Notes (Signed)
Occupational Therapy Session Note  Patient Details  Name: [name] MRN: [id number] Date of Birth: 1936-03-20  Today's Date: 01/25/2018 OT Individual Time: 4944-9675 OT Individual Time Calculation (min): 85 min    Short Term Goals: Week 2:  OT Short Term Goal 1 (Week 2): STG=LTG secondary to ELOS  Skilled Therapeutic Interventions/Progress Updates:    OT intervention with focus on bed mobility, sit<>stand, standing balance, functional amb with RW, BADL retraining, BUE therex, activity tolerance, and safety awareness to increase independence with BADLs and activity tolerance. Pt amb in room with Rollator with CGA to access bathroom to use toilet and for bathing at shower level.  Pt completed bathing tasks with CGA and LB dressing tasks with assistance for donning socks and Ted hose.  Pt stood at sink for grooming tasks.  Pt amb with Rollator to day room and engaged in BUE therex on [employer] (5 mins X 2 at level 5). Pt returned to room and remained in w/c with all needs within reach and chair alarm activated.  Therapy Documentation Precautions:  Precautions Precautions: Fall Restrictions Weight Bearing Restrictions: No Other Position/Activity Restrictions: WBAT LLE   Pain: Pain Assessment Pain Scale: 0-10 Pain Score: 0-No pain   Therapy/Group: Individual Therapy  [name] 01/25/2018, 9:45 AM

## 2018-01-25 NOTE — Progress Notes (Signed)
Patient already placed himself on home CPAP at this time, stated he did not need anything or any assistance at this time, no distress noted at this time.

## 2018-01-25 NOTE — Progress Notes (Signed)
[place] PHYSICAL MEDICINE & REHABILITATION PROGRESS NOTE   Subjective/Complaints:  No shortness of breath does not feel like his swelling is gotten worse.  ROS: neg CP, SOB, N/V/D   Objective:   No results found. No results for input(s): WBC, HGB, HCT, PLT in the last 72 hours. Recent Labs    01/25/18 0535  NA 135  K 3.5  CL 96*  CO2 29  GLUCOSE 106*  BUN 48*  CREATININE 1.35*  CALCIUM 8.8*    Intake/Output Summary (Last 24 hours) at 01/25/2018 0856 Last data filed at 01/25/2018 0755 Gross per 24 hour  Intake 480 ml  Output 1750 ml  Net -1270 ml     Physical Exam: Vital Signs Blood pressure 103/61, pulse 88, temperature 98 F (36.7 C), temperature source Oral, resp. rate 16, weight 94.8 kg, SpO2 93 %.  General: No acute distress Mood and affect are appropriate Heart: Regular rate and rhythm no rubs, + diastolic murmurs or extra sounds Lungs: Clear to auscultation, breathing unlabored, no rales or wheezes Abdomen: Positive bowel sounds, soft nontender to palpation, nondistended Extremities: No clubbing, cyanosis,  Skin: No evidence of breakdown, no evidence of rash Neurologic: Cranial nerves II through XII intact, motor strength is 5/5 in bilateral deltoid, bicep, tricep, grip, hip flexor, knee extensors, ankle dorsiflexor and plantar flexor Sensory exam normal sensation to light touch and proprioception in bilateral upper and lower extremities Cerebellar exam normal finger to nose to finger as well as heel to shin in bilateral upper and lower extremities Musculoskeletal: Full range of motion in all 4 extremities. No joint swelling    Assessment/Plan: 1. Functional deficits secondary to debility following Left thigh hematoma which require 3+ hours per day of interdisciplinary therapy in a comprehensive inpatient rehab setting.  Physiatrist is providing close team supervision and 24 hour management of active medical problems listed below.  Physiatrist  and rehab team continue to assess barriers to discharge/monitor patient progress toward functional and medical goals Medical Problem List and Plan:  1. Functional and mobility deficits secondary to left thigh hematoma and debility  -Cont CIR PT, OT  2. DVT Prophylaxis/Anticoagulation: Pharmaceutical: Coumadin and INR therapeutic 2.67 3. Pain Management: Tylenol as needed  4. Mood: LCSW to follow for evaluation and support  5. Neuropsych: This patient is capable of making decisions on his own behalf.  6. Skin/Wound Care: Damp to dry dressing to  left thigh to remove fibronecrotic debris.  -Air mattress for pressure relief due to MAST with question blistering. Maintain adequate nutritional status.  7. Fluids/Electrolytes/Nutrition: Monitor I's and O's. Serial check of labs. Add protein supplements for protein calorie malnutrition  Po intake low 477ml on 10/23 8. CAD s/p AVR/CAF/severe MR: On Coumadin, amiodarone and torsemide. Monitor heart rate twice daily  9. LLE hematoma with edema: Continue compressive right wraps as well as elevation.    11. Chronic diastolic CHF: Has been referred to CHF clinic recently and Oxygen dependent continue torsemide. He reports that Zaroxolyn on hold per cards.  -monitor daily weights No change on lower dose of torsemide,   Filed Weights   01/22/18 1530 01/23/18 0606 01/25/18 0600  Weight: 94.8 kg 95.3 kg 94.8 kg   Elevated BUN and Creat likely due to demadex with poor po intake will reduce dose and monitor- discussed with cardiologycreat has been running around  1.5 Will check daily weight and adjust diuretic based on this daily weight stable, 12. ABLA: Continue to monitor with serial checks as INR therapeutic now  13. Hypokalemia: We will increase supplement to 3 times daily due to persistent hypokalemia recheck labs in a.m.  14. OSA: Wife to bring patient's machine from home to help compliance.  Care Tool:  Bathing     Body parts bathed by patient:  Right arm, Left arm, Chest, Abdomen, Front perineal area, Right upper leg, Buttocks, Left upper leg, Face, Right lower leg, Left lower leg   Body parts bathed by helper: Right lower leg Body parts n/a: Right lower leg   Bathing assist Assist Level: Contact Guard/Touching assist     Upper Body Dressing/Undressing Upper body dressing   What is the patient wearing?: Pull over shirt    Upper body assist Assist Level: Set up assist    Lower Body Dressing/Undressing Lower body dressing      What is the patient wearing?: Pants, Incontinence brief     Lower body assist Assist for lower body dressing: Contact Guard/Touching assist     Toileting Toileting    Toileting assist Assist for toileting: Supervision/Verbal cueing Assistive Device Comment: (emptied urinal for pt)   Transfers Chair/bed transfer  Transfers assist     Chair/bed transfer assist level: Contact Guard/Touching assist     Locomotion Ambulation   Ambulation assist      Assist level: Contact Guard/Touching assist Assistive device: Rollator Max distance: 100'   Walk 10 feet activity   Assist     Assist level: Contact Guard/Touching assist Assistive device: Rollator   Walk 50 feet activity   Assist Walk 50 feet with 2 turns activity did not occur: Safety/medical concerns  Assist level: Contact Guard/Touching assist Assistive device: Rollator    Walk 150 feet activity   Assist Walk 150 feet activity did not occur: Safety/medical concerns         Walk 10 feet on uneven surface  activity   Assist Walk 10 feet on uneven surfaces activity did not occur: Safety/medical concerns         Wheelchair     Assist Will patient use wheelchair at discharge?: No Type of Wheelchair: Manual    Wheelchair assist level: Supervision/Verbal cueing Max wheelchair distance: 150'    Wheelchair 50 feet with 2 turns activity    Assist        Assist Level: Supervision/Verbal cueing    Wheelchair 150 feet activity     Assist     Assist Level: Supervision/Verbal cueing        LOS: 7 days A FACE TO FACE EVALUATION WAS PERFORMED  Charlett Blake 01/25/2018, 8:56 AM

## 2018-01-25 NOTE — Progress Notes (Signed)
[facility] for warfarin Indication: atrial fibrillation and mechanical aortic valve  Labs: Recent Labs    01/23/18 0613 01/24/18 0522 01/25/18 0535  LABPROT 27.2* 26.6* 28.0*  INR 2.56 2.49 2.67  CREATININE  --   --  1.35*    Estimated Creatinine Clearance: 50.7 mL/min (A) (by C-G formula based on SCr of 1.35 mg/dL (H)).  Assessment: Patient is an 82yoM on Coumadin 5mg  daily exc 7.5mg  MWF PTA for hx afib, mech valve. Brideged with Lovenox. INR remains therapeutic at 2.67. Hgb 9.1, plts wnl. Some bleeding of gums and bruising on 10/20.   Goal of Therapy:  INR 2.5 - 3.5 Monitor platelets by anticoagulation protocol: Yes   Plan:  Give Coumadin 5mg  PO x 1 tonight Monitor daily INR, CBC, s/s of bleed  Thank you for allowing pharmacy to be a part of this patient's care.  [name], PharmD, BCPS Clinical Pharmacist Phone number [phone] 01/25/2018 8:14 AM

## 2018-01-26 ENCOUNTER — Inpatient Hospital Stay (HOSPITAL_COMMUNITY): Payer: Self-pay | Admitting: Physical Therapy

## 2018-01-26 DIAGNOSIS — R7989 Other specified abnormal findings of blood chemistry: Secondary | ICD-10-CM

## 2018-01-26 LAB — PROTIME-INR
INR: 2.78
PROTHROMBIN TIME: 29 s — AB (ref 11.4–15.2)

## 2018-01-26 MED ORDER — WARFARIN SODIUM 5 MG PO TABS
5.0000 mg | ORAL_TABLET | Freq: Once | ORAL | Status: AC
  Administered 2018-01-26: 5 mg via ORAL
  Filled 2018-01-26: qty 1

## 2018-01-26 NOTE — Plan of Care (Signed)
Problem: Consults Goal: RH GENERAL PATIENT EDUCATION Description See Patient Education module for education specifics. Outcome: Progressing Goal: Skin Care Protocol Initiated - if Braden Score 18 or less Description If consults are not indicated, leave blank or document N/A Outcome: Progressing Goal: Nutrition Consult-if indicated Outcome: Progressing Goal: Diabetes Guidelines if Diabetic/Glucose > 140 Description If diabetic or lab glucose is > 140 mg/dl - Initiate Diabetes/Hyperglycemia Guidelines & Document Interventions  Outcome: Progressing   Problem: RH BOWEL ELIMINATION Goal: RH STG MANAGE BOWEL WITH ASSISTANCE Description STG Manage Bowel with min Assistance.  Outcome: Progressing Goal: RH STG MANAGE BOWEL W/MEDICATION W/ASSISTANCE Description STG Manage Bowel with Medication with min Assistance.  Outcome: Progressing   Problem: RH BLADDER ELIMINATION Goal: RH STG MANAGE BLADDER WITH ASSISTANCE Description STG Manage Bladder With min  Assistance  Outcome: Progressing Goal: RH STG MANAGE BLADDER WITH MEDICATION WITH ASSISTANCE Description STG Manage Bladder With Medication With min Assistance.  Outcome: Progressing   Problem: RH SKIN INTEGRITY Goal: RH STG SKIN FREE OF INFECTION/BREAKDOWN Outcome: Progressing Goal: RH STG MAINTAIN SKIN INTEGRITY WITH ASSISTANCE Description STG Maintain Skin Integrity With min  Assistance.  Outcome: Progressing Goal: RH STG ABLE TO PERFORM INCISION/WOUND CARE W/ASSISTANCE Description STG Able To Perform Incision/Wound Care With min Assistance.  Outcome: Progressing   Problem: RH SAFETY Goal: RH STG ADHERE TO SAFETY PRECAUTIONS W/ASSISTANCE/DEVICE Description STG Adhere to Safety Precautions With Assistance/Device. Outcome: Progressing Goal: RH STG DECREASED RISK OF FALL WITH ASSISTANCE Description STG Decreased Risk of Fall With Assistance. Outcome: Progressing   Problem: RH PAIN MANAGEMENT Goal: RH STG PAIN MANAGED  AT OR BELOW PT'S PAIN GOAL Outcome: Progressing   Problem: RH KNOWLEDGE DEFICIT GENERAL Goal: RH STG INCREASE KNOWLEDGE OF SELF CARE AFTER HOSPITALIZATION Outcome: Progressing   Problem: Consults Goal: RH GENERAL PATIENT EDUCATION Description See Patient Education module for education specifics. Outcome: Progressing Goal: Skin Care Protocol Initiated - if Braden Score 18 or less Description If consults are not indicated, leave blank or document N/A Outcome: Progressing Goal: Nutrition Consult-if indicated Outcome: Progressing Goal: Diabetes Guidelines if Diabetic/Glucose > 140 Description If diabetic or lab glucose is > 140 mg/dl - Initiate Diabetes/Hyperglycemia Guidelines & Document Interventions  Outcome: Progressing   Problem: RH BOWEL ELIMINATION Goal: RH STG MANAGE BOWEL WITH ASSISTANCE Description STG Manage Bowel with min Assistance.  Outcome: Progressing Goal: RH STG MANAGE BOWEL W/MEDICATION W/ASSISTANCE Description STG Manage Bowel with Medication with min Assistance.  Outcome: Progressing   Problem: RH BLADDER ELIMINATION Goal: RH STG MANAGE BLADDER WITH ASSISTANCE Description STG Manage Bladder With min  Assistance  Outcome: Progressing Goal: RH STG MANAGE BLADDER WITH MEDICATION WITH ASSISTANCE Description STG Manage Bladder With Medication With min Assistance.  Outcome: Progressing   Problem: RH SKIN INTEGRITY Goal: RH STG SKIN FREE OF INFECTION/BREAKDOWN Outcome: Progressing Goal: RH STG MAINTAIN SKIN INTEGRITY WITH ASSISTANCE Description STG Maintain Skin Integrity With min  Assistance.  Outcome: Progressing Goal: RH STG ABLE TO PERFORM INCISION/WOUND CARE W/ASSISTANCE Description STG Able To Perform Incision/Wound Care With min Assistance.  Outcome: Progressing   Problem: RH SAFETY Goal: RH STG ADHERE TO SAFETY PRECAUTIONS W/ASSISTANCE/DEVICE Description STG Adhere to Safety Precautions With Assistance/Device. Outcome: Progressing Goal: RH  STG DECREASED RISK OF FALL WITH ASSISTANCE Description STG Decreased Risk of Fall With Assistance. Outcome: Progressing   Problem: RH PAIN MANAGEMENT Goal: RH STG PAIN MANAGED AT OR BELOW PT'S PAIN GOAL Outcome: Progressing   Problem: RH KNOWLEDGE DEFICIT GENERAL Goal: RH STG INCREASE KNOWLEDGE OF SELF CARE AFTER HOSPITALIZATION  Outcome: Progressing   

## 2018-01-26 NOTE — Progress Notes (Signed)
[facility] for warfarin Indication: atrial fibrillation and mechanical aortic valve  Labs: Recent Labs    01/24/18 0522 01/25/18 0535 01/26/18 0559  LABPROT 26.6* 28.0* 29.0*  INR 2.49 2.67 2.78  CREATININE  --  1.35*  --     Estimated Creatinine Clearance: 50.7 mL/min (A) (by C-G formula based on SCr of 1.35 mg/dL (H)).  Assessment: Patient is an 82yoM on Coumadin 5mg  daily exc 7.5mg  MWF PTA for hx afib, mech valve. Brideged with Lovenox. INR remains therapeutic at 2.78.   Goal of Therapy:  INR 2.5 - 3.5 Monitor platelets by anticoagulation protocol: Yes   Plan:  Give Coumadin 5mg  PO x 1 tonight Monitor daily INR, CBC, s/s of bleed  Thank you for allowing pharmacy to be a part of this patient's care.  Alanda Slim, PharmD, [facility] Clinical Pharmacist Please see AMION for all Pharmacists' Contact Phone Numbers 01/26/2018, 8:43 AM

## 2018-01-26 NOTE — Progress Notes (Signed)
[facility] PHYSICAL MEDICINE & REHABILITATION PROGRESS NOTE   Subjective/Complaints:  No complaints. Had a good night.   ROS: Patient denies fever, rash, sore throat, blurred vision, nausea, vomiting, diarrhea, cough, shortness of breath or chest pain, joint or back pain, headache, or mood change.    Objective:   No results found. No results for input(s): WBC, HGB, HCT, PLT in the last 72 hours. Recent Labs    01/25/18 0535  NA 135  K 3.5  CL 96*  CO2 29  GLUCOSE 106*  BUN 48*  CREATININE 1.35*  CALCIUM 8.8*    Intake/Output Summary (Last 24 hours) at 01/26/2018 1227 Last data filed at 01/26/2018 1216 Gross per 24 hour  Intake 820 ml  Output 2175 ml  Net -1355 ml     Physical Exam: Vital Signs Blood pressure 123/63, pulse 66, temperature 97.6 F (36.4 C), resp. rate 18, weight 94.8 kg, SpO2 98 %.  Constitutional: No distress . Vital signs reviewed. HEENT: EOMI, oral membranes moist Neck: supple Cardiovascular: RRR without murmur. No JVD    Respiratory: CTA Bilaterally without wheezes or rales. Normal effort    GI: BS +, non-tender, non-distended  Extremities: No clubbing, cyanosis,  Skin: No evidence of breakdown, no evidence of rash Neurologic: Cranial nerves II through XII intact, motor strength is 5/5 in bilateral deltoid, bicep, tricep, grip, hip flexor, knee extensors, ankle dorsiflexor and plantar flexor Sensory exam normal sensation to light touch and proprioception in bilateral upper and lower extremities Cerebellar exam normal finger to nose to finger as well as heel to shin in bilateral upper and lower extremities Musculoskeletal: Full range of motion in all 4 extremities.no swelling    Assessment/Plan: 1. Functional deficits secondary to debility following Left thigh hematoma which require 3+ hours per day of interdisciplinary therapy in a comprehensive inpatient rehab setting.  Physiatrist is providing close team supervision and 24 hour  management of active medical problems listed below.  Physiatrist and rehab team continue to assess barriers to discharge/monitor patient progress toward functional and medical goals Medical Problem List and Plan:  1. Functional and mobility deficits secondary to left thigh hematoma and debility  --Continue CIR therapies including PT, OT   2. DVT Prophylaxis/Anticoagulation: Pharmaceutical: Coumadin and INR therapeutic 2.67 3. Pain Management: Tylenol as needed  4. Mood: LCSW to follow for evaluation and support  5. Neuropsych: This patient is capable of making decisions on his own behalf.  6. Skin/Wound Care: Damp to dry dressing to  left thigh to remove fibronecrotic debris.  -Air mattress for pressure relief due to MAST with question blistering. Maintain adequate nutritional status.  7. Fluids/Electrolytes/Nutrition: Monitor I's and O's. Serial check of labs. Add protein supplements for protein calorie malnutrition   I and O's remain negative 8. CAD s/p AVR/CAF/severe MR: On Coumadin, amiodarone and torsemide. Monitor heart rate twice daily  9. LLE hematoma with edema: Continue compressive right wraps as well as elevation.    11. Chronic diastolic CHF: Has been referred to CHF clinic recently and Oxygen dependent continue torsemide. He reports that Zaroxolyn on hold per cards.  -monitor daily weights  ,   Filed Weights   01/22/18 1530 01/23/18 0606 01/25/18 0600  Weight: 94.8 kg 95.3 kg 94.8 kg   Demadex reduced due to AKI. Fluid balance still quite negative -recheck BMET in AM - 12. ABLA: Continue to monitor with serial checks as INR therapeutic now  13. Hypokalemia:  supplement  3 times daily due to persistent hypokalemia recheck  labs tomorrow 14. OSA: Wife to bring patient's machine from home to help compliance.    Care Tool:  Bathing     Body parts bathed by patient: Right arm, Left arm, Chest, Abdomen, Front perineal area, Right upper leg, Buttocks, Left upper leg, Face,  Right lower leg, Left lower leg   Body parts bathed by helper: Right lower leg Body parts n/a: Right lower leg   Bathing assist Assist Level: Contact Guard/Touching assist     Upper Body Dressing/Undressing Upper body dressing   What is the patient wearing?: Pull over shirt    Upper body assist Assist Level: Independent    Lower Body Dressing/Undressing Lower body dressing      What is the patient wearing?: Pants, Incontinence brief     Lower body assist Assist for lower body dressing: Contact Guard/Touching assist     Toileting Toileting    Toileting assist Assist for toileting: Supervision/Verbal cueing Assistive Device Comment: (emptied urinal for pt)   Transfers Chair/bed transfer  Transfers assist     Chair/bed transfer assist level: Supervision/Verbal cueing     Locomotion Ambulation   Ambulation assist      Assist level: Supervision/Verbal cueing Assistive device: Rollator Max distance: 150'   Walk 10 feet activity   Assist     Assist level: Supervision/Verbal cueing Assistive device: Rollator   Walk 50 feet activity   Assist Walk 50 feet with 2 turns activity did not occur: Safety/medical concerns  Assist level: Supervision/Verbal cueing Assistive device: Rollator    Walk 150 feet activity   Assist Walk 150 feet activity did not occur: Safety/medical concerns  Assist level: Supervision/Verbal cueing Assistive device: Rollator    Walk 10 feet on uneven surface  activity   Assist Walk 10 feet on uneven surfaces activity did not occur: Safety/medical concerns         Wheelchair     Assist Will patient use wheelchair at discharge?: No Type of Wheelchair: Manual    Wheelchair assist level: Supervision/Verbal cueing Max wheelchair distance: 150'    Wheelchair 50 feet with 2 turns activity    Assist        Assist Level: Supervision/Verbal cueing   Wheelchair 150 feet activity     Assist     Assist  Level: Supervision/Verbal cueing        LOS: 8 days A FACE TO FACE EVALUATION WAS PERFORMED  Meredith Staggers 01/26/2018, 12:27 PM

## 2018-01-26 NOTE — Progress Notes (Signed)
Physical Therapy Session Note  Patient Details  Name: [name] MRN: [id number] Date of Birth: 1936/03/31  Today's Date: 01/26/2018 PT Individual Time: 1000-1055 PT Individual Time Calculation (min): 55 min   Short Term Goals: Week 2:  PT Short Term Goal 1 (Week 2): =LTGs due to ELOS  Skilled Therapeutic Interventions/Progress Updates:   Pt in w/c and agreeable to therapy, denies pain. Close supervision provided while pt stood to use urinal x2 this session. Session focused on endurance and global strengthening. Pt ambulated around unit w/ supervision using rollator, 100-150' bouts. No cues needed for safety w/ rollator. Performed strengthening exercises as listed below, brief seated rest breaks 2/2 fatigue. Passive L knee flexion stretch performed during rest breaks, 5x30 sec hold. Performed NuStep 7 min @ level 4 for LE strengthening and global endurance. Pt able to self-regulate breathing strategies while performing NuStep and carrying conversation at the same time, improved from previous sessions with this therapist. Returned to room and ended session in w/c, all needs in reach.   Global strengthening exercises: -partial knee bends w/o UE support, 3x10 -L hamstring curls w/ green theraband resistance, 2x10 -shoulder flexion w/ 2kg, 2x8 -shoulder press w/ 4# dowel, 3x10 -bicep curl 4# dowel, 3x10  Therapy Documentation Precautions:  Precautions Precautions: Fall Restrictions Weight Bearing Restrictions: No Other Position/Activity Restrictions: WBAT LLE General:   Vital Signs:   Pain:   Mobility:   Locomotion :    Trunk/Postural Assessment :    Balance:   Exercises:   Other Treatments:      Therapy/Group: Individual Therapy  [name] [name] 01/26/2018, 12:37 PM

## 2018-01-26 NOTE — Progress Notes (Signed)
Patient already placed on CPAP, no further assistance needed by patient at this time, no distress noted.

## 2018-01-27 ENCOUNTER — Inpatient Hospital Stay (HOSPITAL_COMMUNITY): Payer: Self-pay

## 2018-01-27 LAB — BASIC METABOLIC PANEL
ANION GAP: 11 (ref 5–15)
BUN: 48 mg/dL — ABNORMAL HIGH (ref 8–23)
CHLORIDE: 97 mmol/L — AB (ref 98–111)
CO2: 27 mmol/L (ref 22–32)
CREATININE: 1.39 mg/dL — AB (ref 0.61–1.24)
Calcium: 9 mg/dL (ref 8.9–10.3)
GFR calc Af Amer: 54 mL/min — ABNORMAL LOW (ref >60)
GFR, EST NON AFRICAN AMERICAN: 46 mL/min — AB (ref >60)
Glucose, Bld: 113 mg/dL — ABNORMAL HIGH (ref 70–99)
POTASSIUM: 3.7 mmol/L (ref 3.5–5.1)
Sodium: 135 mmol/L (ref 135–145)

## 2018-01-27 LAB — PROTIME-INR
INR: 2.69
Prothrombin Time: 28.2 seconds — ABNORMAL HIGH (ref 11.4–15.2)

## 2018-01-27 MED ORDER — WARFARIN SODIUM 7.5 MG PO TABS
7.5000 mg | ORAL_TABLET | Freq: Once | ORAL | Status: AC
  Administered 2018-01-27: 7.5 mg via ORAL
  Filled 2018-01-27: qty 1

## 2018-01-27 NOTE — Progress Notes (Signed)
[facility] for warfarin Indication: atrial fibrillation and mechanical aortic valve  Labs: Recent Labs    01/25/18 0535 01/26/18 0559 01/27/18 0652  LABPROT 28.0* 29.0* 28.2*  INR 2.67 2.78 2.69  CREATININE 1.35*  --  1.39*    Estimated Creatinine Clearance: 49.3 mL/min (A) (by C-G formula based on SCr of 1.39 mg/dL (H)).  Assessment: Patient is an 82yoM on Coumadin 5mg  daily exc 7.5mg  MWF PTA for hx afib, mech valve. Brideged with Lovenox. INR remains therapeutic at 2.69.   Goal of Therapy:  INR 2.5 - 3.5 Monitor platelets by anticoagulation protocol: Yes   Plan:  Give Coumadin 7.5 mg PO x 1 tonight Monitor daily INR, CBC, s/s of bleed  Thank you for allowing pharmacy to be a part of this patient's care.  Alanda Slim, PharmD, [facility] Clinical Pharmacist Please see AMION for all Pharmacists' Contact Phone Numbers 01/27/2018, 8:42 AM

## 2018-01-27 NOTE — Progress Notes (Signed)
Pt assisted to his home CPAP.  Pt stated no additional assistance needed.

## 2018-01-27 NOTE — Progress Notes (Signed)
[facility] PHYSICAL MEDICINE & REHABILITATION PROGRESS NOTE   Subjective/Complaints:  Pt doing well. Up in shower. Breathing well. Improving activity tolerance  ROS: Patient denies fever, rash, sore throat, blurred vision, nausea, vomiting, diarrhea, cough, shortness of breath or chest pain, joint or back pain, headache, or mood change.    Objective:   No results found. No results for input(s): WBC, HGB, HCT, PLT in the last 72 hours. Recent Labs    01/25/18 0535 01/27/18 0652  NA 135 135  K 3.5 3.7  CL 96* 97*  CO2 29 27  GLUCOSE 106* 113*  BUN 48* 48*  CREATININE 1.35* 1.39*  CALCIUM 8.8* 9.0    Intake/Output Summary (Last 24 hours) at 01/27/2018 1138 Last data filed at 01/27/2018 1101 Gross per 24 hour  Intake 960 ml  Output 1875 ml  Net -915 ml     Physical Exam: Vital Signs Blood pressure (!) 106/59, pulse 79, temperature (!) 97.5 F (36.4 C), resp. rate 18, weight 95.3 kg, SpO2 96 %.  Constitutional: No distress . Vital signs reviewed. HEENT: EOMI, oral membranes moist Neck: supple Cardiovascular: RRR without murmur. No JVD    Respiratory: CTA Bilaterally without wheezes or rales. Normal effort    GI: BS +, non-tender, non-distended  Extremities: No clubbing, cyanosis,  Skin: No evidence of breakdown, no evidence of rash Neurologic: Cranial nerves II through XII intact, motor strength is 5/5 in bilateral deltoid, bicep, tricep, grip, hip flexor, knee extensors, ankle dorsiflexor and plantar flexor Sensory exam normal sensation to light touch and proprioception in bilateral upper and lower extremities Cerebellar exam normal finger to nose to finger as well as heel to shin in bilateral upper and lower extremities Musculoskeletal: Full range of motion in all 4 extremities.no swelling    Assessment/Plan: 1. Functional deficits secondary to debility following Left thigh hematoma which require 3+ hours per day of interdisciplinary therapy in a comprehensive  inpatient rehab setting.  Physiatrist is providing close team supervision and 24 hour management of active medical problems listed below.  Physiatrist and rehab team continue to assess barriers to discharge/monitor patient progress toward functional and medical goals Medical Problem List and Plan:  1. Functional and mobility deficits secondary to left thigh hematoma and debility  --Continue CIR therapies including PT, OT   2. DVT Prophylaxis/Anticoagulation: Pharmaceutical: Coumadin and INR therapeutic 2.69 3. Pain Management: Tylenol as needed  4. Mood: LCSW to follow for evaluation and support  5. Neuropsych: This patient is capable of making decisions on his own behalf.  6. Skin/Wound Care: Damp to dry dressing to  left thigh to remove fibronecrotic debris.  -Air mattress for pressure relief due to MAST with question blistering. Maintain adequate nutritional status.  7. Fluids/Electrolytes/Nutrition: Monitor I's and O's. Serial check of labs. Add protein supplements for protein calorie malnutrition   I and O's remain negative 8. CAD s/p AVR/CAF/severe MR: On Coumadin, amiodarone and torsemide. Monitor heart rate twice daily  9. LLE hematoma with edema: Continue compressive right wraps as well as elevation.    11. Chronic diastolic CHF: Has been referred to CHF clinic recently and Oxygen dependent continue torsemide. He reports that Zaroxolyn on hold per cards.  -monitor daily weights  ,   Filed Weights   01/23/18 0606 01/25/18 0600 01/27/18 0445  Weight: 95.3 kg 94.8 kg 95.3 kg   Demadex reduced due to AKI. Fluid balance still quite negative -I personally reviewed the patient's labs today.  BUN/cr holding -continue with same meds 12.  ABLA: Continue to monitor with serial checks as INR therapeutic now  13. Hypokalemia:  supplement  3 times daily due to persistent hypokalemia recheck labs tomorrow 14. OSA: Wife to bring patient's machine from home to help compliance.    Care  Tool:  Bathing     Body parts bathed by patient: Right arm, Left arm, Chest, Abdomen, Front perineal area, Right upper leg, Buttocks, Left upper leg, Face, Right lower leg, Left lower leg   Body parts bathed by helper: Right lower leg Body parts n/a: Right lower leg   Bathing assist Assist Level: Contact Guard/Touching assist     Upper Body Dressing/Undressing Upper body dressing   What is the patient wearing?: Pull over shirt    Upper body assist Assist Level: Independent    Lower Body Dressing/Undressing Lower body dressing      What is the patient wearing?: Pants, Incontinence brief     Lower body assist Assist for lower body dressing: Contact Guard/Touching assist     Toileting Toileting    Toileting assist Assist for toileting: Supervision/Verbal cueing Assistive Device Comment: (emptied urinal for pt)   Transfers Chair/bed transfer  Transfers assist     Chair/bed transfer assist level: Supervision/Verbal cueing     Locomotion Ambulation   Ambulation assist      Assist level: Supervision/Verbal cueing Assistive device: Rollator Max distance: 150'   Walk 10 feet activity   Assist     Assist level: Supervision/Verbal cueing Assistive device: Rollator   Walk 50 feet activity   Assist Walk 50 feet with 2 turns activity did not occur: Safety/medical concerns  Assist level: Supervision/Verbal cueing Assistive device: Rollator    Walk 150 feet activity   Assist Walk 150 feet activity did not occur: Safety/medical concerns  Assist level: Supervision/Verbal cueing Assistive device: Rollator    Walk 10 feet on uneven surface  activity   Assist Walk 10 feet on uneven surfaces activity did not occur: Safety/medical concerns         Wheelchair     Assist Will patient use wheelchair at discharge?: No Type of Wheelchair: Manual    Wheelchair assist level: Supervision/Verbal cueing Max wheelchair distance: 150'     Wheelchair 50 feet with 2 turns activity    Assist        Assist Level: Supervision/Verbal cueing   Wheelchair 150 feet activity     Assist     Assist Level: Supervision/Verbal cueing        LOS: 9 days A FACE TO FACE EVALUATION WAS PERFORMED  Meredith Staggers 01/27/2018, 11:38 AM

## 2018-01-27 NOTE — Progress Notes (Signed)
Occupational Therapy Session Note  Patient Details  Name: [name] MRN: [id number] Date of Birth: 1935/12/13  Today's Date: 01/27/2018 OT Individual Time: 5400-8676 OT Individual Time Calculation (min): 56 min    Short Term Goals: Week 1:  OT Short Term Goal 1 (Week 1): Pt will sit to stand wiht min A in prep for LB dressing OT Short Term Goal 1 - Progress (Week 1): Met OT Short Term Goal 2 (Week 1): Pt will complete BSC/toilet transfer wiht Min A OT Short Term Goal 2 - Progress (Week 1): Met OT Short Term Goal 3 (Week 1): Pt will thread BLE into pants with AE PRN and supervision OT Short Term Goal 3 - Progress (Week 1): Met OT Short Term Goal 4 (Week 1): Pt will complete clothing managment prior to toileting with CGA OT Short Term Goal 4 - Progress (Week 1): Met  Skilled Therapeutic Interventions/Progress Updates:    1:1. Pt received seated in bed. Pt reporting pain aching pain in LE up to a 2/10. Pt more complaining of stiffness than pain. Pt completes supine to sit with bed rails and supervision. Pt ambulates with rolator and A to only keep pants from fall ing off d/t large size. Pt transfers onto [facility] over toilet/shower with safe demo of reach back and brake management with no success of BM on first attempt. Second attempt after shower pt with B&B void (not wearing clothing) with supervision of lateral lean hygiene. Pt bathes sit to stand level with supervision to wash buttocks. Pt able to bend forward and wash B feet. Pt dresses sit to stand level at sink with set up for UB dressing and supervision for LB dressing. OT dons teds and pt dons shoes with supervision.   Therapy Documentation Precautions:  Precautions Precautions: Fall Restrictions Weight Bearing Restrictions: No Other Position/Activity Restrictions: WBAT LLE General:   Vital Signs: Therapy Vitals Temp: (!) 97.5 F (36.4 C) Pulse Rate: 79 Resp: 18 BP: (!) 106/59 Patient Position (if appropriate):  Lying Oxygen Therapy SpO2: 96 % O2 Device: Room Air Pain:  Therapy/Group: Individual Therapy  Tonny Branch 01/27/2018, 7:13 AM

## 2018-01-28 ENCOUNTER — Inpatient Hospital Stay (HOSPITAL_COMMUNITY): Payer: Self-pay

## 2018-01-28 ENCOUNTER — Inpatient Hospital Stay (HOSPITAL_COMMUNITY): Payer: Self-pay | Admitting: Occupational Therapy

## 2018-01-28 LAB — URINE CULTURE

## 2018-01-28 LAB — CBC
HEMATOCRIT: 29.6 % — AB (ref 39.0–52.0)
HEMOGLOBIN: 9.2 g/dL — AB (ref 13.0–17.0)
MCH: 30.1 pg (ref 26.0–34.0)
MCHC: 31.1 g/dL (ref 30.0–36.0)
MCV: 96.7 fL (ref 80.0–100.0)
NRBC: 0 % (ref 0.0–0.2)
Platelets: 144 10*3/uL — ABNORMAL LOW (ref 150–400)
RBC: 3.06 MIL/uL — ABNORMAL LOW (ref 4.22–5.81)
RDW: 23 % — AB (ref 11.5–15.5)
WBC: 3.8 10*3/uL — AB (ref 4.0–10.5)

## 2018-01-28 LAB — BASIC METABOLIC PANEL
ANION GAP: 7 (ref 5–15)
BUN: 45 mg/dL — ABNORMAL HIGH (ref 8–23)
CO2: 28 mmol/L (ref 22–32)
Calcium: 8.9 mg/dL (ref 8.9–10.3)
Chloride: 100 mmol/L (ref 98–111)
Creatinine, Ser: 1.34 mg/dL — ABNORMAL HIGH (ref 0.61–1.24)
GFR calc Af Amer: 56 mL/min — ABNORMAL LOW (ref >60)
GFR calc non Af Amer: 48 mL/min — ABNORMAL LOW (ref >60)
GLUCOSE: 108 mg/dL — AB (ref 70–99)
POTASSIUM: 4.2 mmol/L (ref 3.5–5.1)
Sodium: 135 mmol/L (ref 135–145)

## 2018-01-28 LAB — PROTIME-INR
INR: 2.34
Prothrombin Time: 25.3 seconds — ABNORMAL HIGH (ref 11.4–15.2)

## 2018-01-28 MED ORDER — TORSEMIDE 100 MG PO TABS
100.0000 mg | ORAL_TABLET | Freq: Two times a day (BID) | ORAL | Status: DC
  Administered 2018-01-28 – 2018-01-31 (×6): 100 mg via ORAL
  Filled 2018-01-28 (×7): qty 1

## 2018-01-28 MED ORDER — WARFARIN SODIUM 5 MG PO TABS
10.0000 mg | ORAL_TABLET | Freq: Once | ORAL | Status: AC
  Administered 2018-01-28: 10 mg via ORAL
  Filled 2018-01-28: qty 2

## 2018-01-28 NOTE — Progress Notes (Signed)
[place] PHYSICAL MEDICINE & REHABILITATION PROGRESS NOTE   Subjective/Complaints:    ROS: Patient denies, nausea, vomiting, diarrhea, cough, shortness of breath or chest pain, joint or back pain, headache, or mood change.    Objective:   No results found. Recent Labs    01/28/18 0556  WBC 3.8*  HGB 9.2*  HCT 29.6*  PLT 144*   Recent Labs    01/27/18 0652 01/28/18 0556  NA 135 135  K 3.7 4.2  CL 97* 100  CO2 27 28  GLUCOSE 113* 108*  BUN 48* 45*  CREATININE 1.39* 1.34*  CALCIUM 9.0 8.9    Intake/Output Summary (Last 24 hours) at 01/28/2018 0819 Last data filed at 01/27/2018 2300 Gross per 24 hour  Intake 1080 ml  Output 1450 ml  Net -370 ml     Physical Exam: Vital Signs Blood pressure (!) 114/53, pulse 65, temperature (!) 97.4 F (36.3 C), resp. rate 18, weight 95.3 kg, SpO2 97 %.  Constitutional: No distress . Vital signs reviewed. HEENT: EOMI, oral membranes moist Neck: supple Cardiovascular: RRR without murmur. No JVD    Respiratory: CTA Bilaterally without wheezes or rales. Normal effort    GI: BS +, non-tender, non-distended  Extremities: No clubbing, cyanosis,  Skin: No evidence of breakdown, no evidence of rash Neurologic: Cranial nerves II through XII intact, motor strength is 5/5 in bilateral deltoid, bicep, tricep, grip, hip flexor, knee extensors, ankle dorsiflexor and plantar flexor Sensory exam normal sensation to light touch and proprioception in bilateral upper and lower extremities Cerebellar exam normal finger to nose to finger as well as heel to shin in bilateral upper and lower extremities Musculoskeletal: Full range of motion in all 4 extremities.no swelling Ext pre tib edema increased to 2+      Care Tool:  Bathing     Body parts bathed by patient: Right arm, Left arm, Chest, Abdomen, Front perineal area, Right upper leg, Buttocks, Left upper leg, Face, Right lower leg, Left lower leg   Body parts bathed by helper:  Right lower leg Body parts n/a: Right lower leg   Bathing assist Assist Level: Contact Guard/Touching assist     Upper Body Dressing/Undressing Upper body dressing   What is the patient wearing?: Pull over shirt    Upper body assist Assist Level: Independent    Lower Body Dressing/Undressing Lower body dressing      What is the patient wearing?: Pants, Incontinence brief     Lower body assist Assist for lower body dressing: Contact Guard/Touching assist     Toileting Toileting    Toileting assist Assist for toileting: Supervision/Verbal cueing Assistive Device Comment: (emptied urinal for pt)   Transfers Chair/bed transfer  Transfers assist     Chair/bed transfer assist level: Contact Guard/Touching assist     Locomotion Ambulation   Ambulation assist      Assist level: Supervision/Verbal cueing Assistive device: Rollator Max distance: 150'   Walk 10 feet activity   Assist     Assist level: Supervision/Verbal cueing Assistive device: Rollator   Walk 50 feet activity   Assist Walk 50 feet with 2 turns activity did not occur: Safety/medical concerns  Assist level: Supervision/Verbal cueing Assistive device: Rollator    Walk 150 feet activity   Assist Walk 150 feet activity did not occur: Safety/medical concerns  Assist level: Supervision/Verbal cueing Assistive device: Rollator    Walk 10 feet on uneven surface  activity   Assist Walk 10 feet on uneven surfaces activity did  not occur: Safety/medical concerns         Wheelchair     Assist Will patient use wheelchair at discharge?: No Type of Wheelchair: Manual    Wheelchair assist level: Supervision/Verbal cueing Max wheelchair distance: 150'    Wheelchair 50 feet with 2 turns activity    Assist        Assist Level: Supervision/Verbal cueing   Wheelchair 150 feet activity     Assist     Assist Level: Supervision/Verbal cueing    Assessment/Plan: 1.  Functional deficits secondary to debility following Left thigh hematoma which require 3+ hours per day of interdisciplinary therapy in a comprehensive inpatient rehab setting.  Physiatrist is providing close team supervision and 24 hour management of active medical problems listed below.  Physiatrist and rehab team continue to assess barriers to discharge/monitor patient progress toward functional and medical goals Medical Problem List and Plan:  1. Functional and mobility deficits secondary to left thigh hematoma and debility  --Continue CIR therapies including PT, OT   2. DVT Prophylaxis/Anticoagulation: Pharmaceutical: Coumadin and INR therapeutic 2.36 3. Pain Management: Tylenol as needed  4. Mood: LCSW to follow for evaluation and support  5. Neuropsych: This patient is capable of making decisions on his own behalf.  6. Skin/Wound Care: Damp to dry dressing to  left thigh to remove fibronecrotic debris.  -Air mattress for pressure relief due to MAST with question blistering. Maintain adequate nutritional status.  7. Fluids/Electrolytes/Nutrition: Monitor I's and O's. Serial check of labs. Add protein supplements for protein calorie malnutrition   I and O's remain negative but intake increasing 8. CAD s/p AVR/CAF/severe MR: On Coumadin, amiodarone and torsemide. Monitor heart rate twice daily  9. LLE hematoma with edema: Continue compressive right wraps as well as elevation.    11. Chronic diastolic CHF: Has been referred to CHF clinic recently and Oxygen dependent continue torsemide. He reports that Zaroxolyn on hold per cards.  -monitor daily weights  ,   Filed Weights   01/23/18 0606 01/25/18 0600 01/27/18 0445  Weight: 95.3 kg 94.8 kg 95.3 kg   Increased edema in LE - resume usual dose demadex 12. ABLA: Continue to monitor with serial checks as INR therapeutic now , Hgb stable 13. Hypokalemia:  supplement  3 times daily due to persistent hypokalemia recheck labs tomorrow 14. OSA:  Wife to bring patient's machine from home to help compliance.     LOS: 10 days A FACE TO FACE EVALUATION WAS PERFORMED  Charlett Blake 01/28/2018, 8:19 AM

## 2018-01-28 NOTE — Plan of Care (Signed)
  Problem: Consults Goal: RH GENERAL PATIENT EDUCATION Description See Patient Education module for education specifics. Outcome: Progressing Goal: Skin Care Protocol Initiated - if Braden Score 18 or less Description If consults are not indicated, leave blank or document N/A Outcome: Progressing Goal: Nutrition Consult-if indicated Outcome: Progressing Goal: Diabetes Guidelines if Diabetic/Glucose > 140 Description If diabetic or lab glucose is > 140 mg/dl - Initiate Diabetes/Hyperglycemia Guidelines & Document Interventions  Outcome: Progressing   Problem: RH BOWEL ELIMINATION Goal: RH STG MANAGE BOWEL WITH ASSISTANCE Description STG Manage Bowel with min Assistance.  Outcome: Progressing Goal: RH STG MANAGE BOWEL W/MEDICATION W/ASSISTANCE Description STG Manage Bowel with Medication with min Assistance.  Outcome: Progressing   Problem: RH BLADDER ELIMINATION Goal: RH STG MANAGE BLADDER WITH ASSISTANCE Description STG Manage Bladder With min  Assistance  Outcome: Progressing Goal: RH STG MANAGE BLADDER WITH MEDICATION WITH ASSISTANCE Description STG Manage Bladder With Medication With min Assistance.  Outcome: Progressing   Problem: RH SKIN INTEGRITY Goal: RH STG ABLE TO PERFORM INCISION/WOUND CARE W/ASSISTANCE Description STG Able To Perform Incision/Wound Care With min Assistance.  Outcome: Progressing   Problem: RH SAFETY Goal: RH STG ADHERE TO SAFETY PRECAUTIONS W/ASSISTANCE/DEVICE Description STG Adhere to Safety Precautions With Assistance/Device. Outcome: Progressing Goal: RH STG DECREASED RISK OF FALL WITH ASSISTANCE Description STG Decreased Risk of Fall With Assistance. Outcome: Progressing   Problem: RH PAIN MANAGEMENT Goal: RH STG PAIN MANAGED AT OR BELOW PT'S PAIN GOAL Outcome: Progressing   Problem: RH KNOWLEDGE DEFICIT GENERAL Goal: RH STG INCREASE KNOWLEDGE OF SELF CARE AFTER HOSPITALIZATION Outcome: Progressing   Problem: RH SKIN  INTEGRITY Goal: RH STG MAINTAIN SKIN INTEGRITY WITH ASSISTANCE Description STG Maintain Skin Integrity With min  Assistance.  Outcome: Not Progressing    New pressure related skin injury to left lateral foot.

## 2018-01-28 NOTE — Progress Notes (Signed)
ANTICOAGULATION CONSULT NOTE  Pharmacy Consult for warfarin Indication: atrial fibrillation and mechanical aortic valve  Labs: Recent Labs    01/26/18 0559 01/27/18 0652 01/28/18 0556  HGB  --   --  9.2*  HCT  --   --  29.6*  PLT  --   --  144*  LABPROT 29.0* 28.2* 25.3*  INR 2.78 2.69 2.34  CREATININE  --  1.39* 1.34*    Estimated Creatinine Clearance: 51.1 mL/min (A) (by C-G formula based on SCr of 1.34 mg/dL (H)).  Assessment: Patient is an 82yoM on Coumadin 5mg  daily exc 7.5mg  MWF PTA for hx afib, mech valve. Bridged with Lovenox thru 10/20.  INR today is slightly SUBtherapeutic (INR 2.34, goal of 2.5-3.5). Hgb/Hct stable, plts 144. MD okay with holding off on lovenox bridge today. Will give a boosted warfarin dose to get back within range.   Goal of Therapy:  INR 2.5 - 3.5 Monitor platelets by anticoagulation protocol: Yes   Plan:  - Warfarin 10 mg x 1 dose at 1800 today - Will continue to monitor for any signs/symptoms of bleeding and will follow up with PT/INR in the a.m.   Thank you for allowing pharmacy to be a part of this patient's care.  Alycia Rossetti, PharmD, BCPS Clinical Pharmacist Pager: [phone] Clinical phone for 01/28/2018 from 7a-3:30p: [phone] If after 3:30p, please call main pharmacy at: [phone] Please check AMION for all [place] numbers 01/28/2018 9:29 AM

## 2018-01-28 NOTE — Progress Notes (Signed)
Occupational Therapy Session Note  Patient Details  Name: [name] MRN: [id number] Date of Birth: [birth year]  Today's Date: 01/28/2018 OT Individual Time: 0930-1030 OT Individual Time Calculation (min): 60 min    Short Term Goals: Week 1:  OT Short Term Goal 1 (Week 1): Pt will sit to stand wiht min A in prep for LB dressing OT Short Term Goal 1 - Progress (Week 1): Met OT Short Term Goal 2 (Week 1): Pt will complete BSC/toilet transfer wiht Min A OT Short Term Goal 2 - Progress (Week 1): Met OT Short Term Goal 3 (Week 1): Pt will thread BLE into pants with AE PRN and supervision OT Short Term Goal 3 - Progress (Week 1): Met OT Short Term Goal 4 (Week 1): Pt will complete clothing managment prior to toileting with CGA OT Short Term Goal 4 - Progress (Week 1): Met Week 2:  OT Short Term Goal 1 (Week 2): STG=LTG secondary to ELOS      Skilled Therapeutic Interventions/Progress Updates:    Pt seen for ADL training with a focus on activity tolerance and balance and use of AE.  Pt received in bed with pt stating his full urinal had spilled and his clothing and bed were soaked.   Pt sat to EOB with S, stood to rollator and ambulated to toilet with S.  Pt then toileted with distant supervision and then transferred to shower with S. Pt provided with long handled sponge to reach his feet and back. Pt bathed with S and then ambulated to chair in room to complete dressing with set up/S except for donning TED hose and shoes.  Demonstrated TED hose donning technique using a plastic bag that he can explain to his wife.  Pt completed grooming tasks at sink I'tly from w/c level.  Pt in room with all needs met.   Therapy Documentation Precautions:  Precautions Precautions: Fall Restrictions Weight Bearing Restrictions: No Other Position/Activity Restrictions: WBAT LLE  Pain: Pain Assessment Pain Scale: 0-10 Pain Score: 0-No pain   Therapy/Group: Individual  Therapy  Lyons Switch 01/28/2018, 12:15 PM

## 2018-01-28 NOTE — Progress Notes (Signed)
Physical Therapy Session Note  Patient Details  Name: [name] MRN: [id number] Date of Birth: 10-10-1935  Today's Date: 01/28/2018 PT Individual Time: 1047-1200 and 1301- 1357 PT Individual Time Calculation (min): 73 min and 56 min   Short Term Goals: Week 2:  PT Short Term Goal 1 (Week 2): =LTGs due to ELOS  Skilled Therapeutic Interventions/Progress Updates:    Session 1: Pt seated in w/c upon PT arrival, agreeable to therapy tx and denies pain. Pt ambulated from room>gym x 200 ft with rollator and CGA. Pt transferred to mat with CGA. Pt performed x 5 sit<>stands using UEs to help push up CGA and then x 5 sit<>stands with LEs only for strengthening mod A. Pt performed LE therex for strengthening including 2 x 10 each: LAQ with 3# ankle weight, hip flexion with 3# ankle weight, L LE heel slides, hip abduction with orange theraband, standing mini squats. Therapist performed L LE hamstring and quad stretching this session for knee ROM. Pt ambulated x 85 ft with rollator and CGA working on endurance. Pt ambulated from gym>rehab apartment x 65 ft CGA, performed bed transfer/bed mobility with supervision, ambulated to the ortho gym x 100 ft with rollator and CGA. Pt performed car transfer with min assist to help with L LE management lifting in/out of car. Pt ambulated back to gym x 200 ft with rollator and CGA. Pt ambulated to the dayroom with CGA and rollator, transferred on/off nustep with CGA. Pt used nustep x 6 minutes for strengthening and endurance. Pt ambulated back to room and left seated in w/c with needs in reach, chair alarm set.   Session 2: Pt seated in w/c upon PT arrival, agreeable to therapy tx and denies pain. Pt ambulated from room>outside x 300 ft with rollator and CGA working on endurance and activity tolerance. While outside pt ambulated x 100 ft on unlevel surfaces with CGA-min assist. Pt ambulated back to unit to the gym x 300 ft with CGA and rollator. Pt performed 2 x 5  sit<>stands this session pushing up with LEs to stand and then focusing on eccentric strengthening to use LEs only with sitting, tactile cues for anterior weightshift. Pt worked on standing balance while standing on red wedge without UE support reaching outside BOS forward to reach for horseshoes and toss, focus on forward reaching to correct posterior lean. In standing pt performed 2x10 calf raises for strengthening, UE support on rollator. Pt ambulated back to room with rollator and supervision, left seated in w/c with needs in reach and chair alarm set.   Therapy Documentation Precautions:  Precautions Precautions: Fall Restrictions Weight Bearing Restrictions: No Other Position/Activity Restrictions: WBAT LLE    Therapy/Group: Individual Therapy  Netta Corrigan, PT, DPT 01/28/2018, 7:55 AM

## 2018-01-29 ENCOUNTER — Inpatient Hospital Stay (HOSPITAL_COMMUNITY): Payer: Self-pay

## 2018-01-29 ENCOUNTER — Telehealth: Payer: Self-pay | Admitting: Hematology

## 2018-01-29 ENCOUNTER — Inpatient Hospital Stay (HOSPITAL_COMMUNITY): Payer: Self-pay | Admitting: Physical Therapy

## 2018-01-29 LAB — PROTIME-INR
INR: 2.68
PROTHROMBIN TIME: 28.2 s — AB (ref 11.4–15.2)

## 2018-01-29 MED ORDER — WARFARIN SODIUM 7.5 MG PO TABS
7.5000 mg | ORAL_TABLET | Freq: Once | ORAL | Status: AC
  Administered 2018-01-29: 7.5 mg via ORAL
  Filled 2018-01-29: qty 1

## 2018-01-29 NOTE — Progress Notes (Signed)
Occupational Therapy Session Note  Patient Details  Name: [name] MRN: [id number] Date of Birth: 12/29/1935  Today's Date: 01/29/2018 OT Individual Time: 0830-1000 OT Individual Time Calculation (min): 90 min    Short Term Goals: Week 2:  OT Short Term Goal 1 (Week 2): STG=LTG secondary to ELOS  Skilled Therapeutic Interventions/Progress Updates:    OT intervention with initial focus on functional amb with Rolattor, toielting, bathing at shower level, dressing with sit<>stand from w/c, standing balance, activity tolerance, and safety awareness to increase independence with BADLs. Pt amb in room with Rollator to go to bathroom for toileting and shower.  Pt completes bathing tasks at supervision level with use of long handle sponge for feet and back. Pt completed dressing tasks with sit<>stand from w/c at sink at supervision level.  Pt requires more than a reasonable amount of time with multiple rest breaks.  Pt amb with Rollator to day room for therex.  Pt completed BUE therex on SciFit (6 mins at load 5) and BLE therex on NuStep (6 mins at load 5). Pt required extended rest breaks between exercise. Pt amb with Rollator to room and remained in w/c with all needs within reach and chair alarm activated. Pt commented on how fatigued he was after exercise.   Therapy Documentation Precautions:  Precautions Precautions: Fall Restrictions Weight Bearing Restrictions: No Other Position/Activity Restrictions: WBAT LLE  Pain:  Pt denies pain   Therapy/Group: Individual Therapy  [name] 01/29/2018, 11:27 AM

## 2018-01-29 NOTE — Progress Notes (Signed)
[place] PHYSICAL MEDICINE & REHABILITATION PROGRESS NOTE   Subjective/Complaints:  No SOB, some increase leg swelling, UO increased  and intake dropped  ROS: Patient denies, nausea, vomiting, diarrhea, cough, shortness of breath or chest pain, joint or back pain, headache, or mood change.    Objective:   No results found. Recent Labs    01/28/18 0556  WBC 3.8*  HGB 9.2*  HCT 29.6*  PLT 144*   Recent Labs    01/27/18 0652 01/28/18 0556  NA 135 135  K 3.7 4.2  CL 97* 100  CO2 27 28  GLUCOSE 113* 108*  BUN 48* 45*  CREATININE 1.39* 1.34*  CALCIUM 9.0 8.9    Intake/Output Summary (Last 24 hours) at 01/29/2018 0818 Last data filed at 01/29/2018 0653 Gross per 24 hour  Intake 480 ml  Output 1725 ml  Net -1245 ml     Physical Exam: Vital Signs Blood pressure (!) 109/48, pulse 67, temperature 97.9 F (36.6 C), resp. rate 18, weight 95.3 kg, SpO2 95 %.  Constitutional: No distress . Vital signs reviewed. HEENT: EOMI, oral membranes moist Neck: supple Cardiovascular: RRR without murmur. No JVD    Respiratory: CTA Bilaterally without wheezes or rales. Normal effort    GI: BS +, non-tender, non-distended  Extremities: No clubbing, cyanosis,  Skin: No evidence of breakdown, no evidence of rash Neurologic: Cranial nerves II through XII intact, motor strength is 5/5 in bilateral deltoid, bicep, tricep, grip, hip flexor, knee extensors, ankle dorsiflexor and plantar flexor Sensory exam normal sensation to light touch and proprioception in bilateral upper and lower extremities Cerebellar exam normal finger to nose to finger as well as heel to shin in bilateral upper and lower extremities Musculoskeletal: Full range of motion in all 4 extremities.no swelling Ext pre tib edema increased to 2+      Care Tool:  Bathing     Body parts bathed by patient: Right arm, Left arm, Chest, Abdomen, Front perineal area, Right upper leg, Buttocks, Left upper leg, Face,  Right lower leg, Left lower leg   Body parts bathed by helper: Right lower leg Body parts n/a: Right lower leg   Bathing assist Assist Level: Supervision/Verbal cueing     Upper Body Dressing/Undressing Upper body dressing   What is the patient wearing?: Pull over shirt    Upper body assist Assist Level: Independent    Lower Body Dressing/Undressing Lower body dressing      What is the patient wearing?: Pants, Incontinence brief     Lower body assist Assist for lower body dressing: Supervision/Verbal cueing     Toileting Toileting    Toileting assist Assist for toileting: Supervision/Verbal cueing Assistive Device Comment: (emptied urinal for pt)   Transfers Chair/bed transfer  Transfers assist     Chair/bed transfer assist level: Contact Guard/Touching assist     Locomotion Ambulation   Ambulation assist      Assist level: Contact Guard/Touching assist Assistive device: Rollator Max distance: 150'   Walk 10 feet activity   Assist     Assist level: Contact Guard/Touching assist Assistive device: Rollator   Walk 50 feet activity   Assist Walk 50 feet with 2 turns activity did not occur: Safety/medical concerns  Assist level: Contact Guard/Touching assist Assistive device: Rollator    Walk 150 feet activity   Assist Walk 150 feet activity did not occur: Safety/medical concerns  Assist level: Contact Guard/Touching assist Assistive device: Rollator    Walk 10 feet on uneven surface  activity   Assist Walk 10 feet on uneven surfaces activity did not occur: Safety/medical concerns         Wheelchair     Assist Will patient use wheelchair at discharge?: No Type of Wheelchair: Manual    Wheelchair assist level: Supervision/Verbal cueing Max wheelchair distance: 150'    Wheelchair 50 feet with 2 turns activity    Assist        Assist Level: Supervision/Verbal cueing   Wheelchair 150 feet activity     Assist      Assist Level: Supervision/Verbal cueing    Assessment/Plan: 1. Functional deficits secondary to debility following Left thigh hematoma which require 3+ hours per day of interdisciplinary therapy in a comprehensive inpatient rehab setting.  Physiatrist is providing close team supervision and 24 hour management of active medical problems listed below.  Physiatrist and rehab team continue to assess barriers to discharge/monitor patient progress toward functional and medical goals Medical Problem List and Plan:  1. Functional and mobility deficits secondary to left thigh hematoma and debility  --Continue CIR therapies including PT, OT  Team conf in am 2. DVT Prophylaxis/Anticoagulation: Pharmaceutical: Coumadin and INR therapeutic 2.36 3. Pain Management: Tylenol as needed  4. Mood: LCSW to follow for evaluation and support  5. Neuropsych: This patient is capable of making decisions on his own behalf.  6. Skin/Wound Care: Damp to dry dressing to  left thigh to remove fibronecrotic debris.  -Air mattress for pressure relief due to MAST with question blistering. Maintain adequate nutritional status.  7. Fluids/Electrolytes/Nutrition: Monitor I's and O's. Serial check of labs. Add protein supplements for protein calorie malnutrition   I and O's remain negative but intake increasing 8. CAD s/p AVR/CAF/severe MR: On Coumadin, amiodarone and torsemide. Monitor heart rate twice daily  9. LLE hematoma with edema: Continue compressive right wraps as well as elevation.    11. Chronic diastolic CHF: Has been referred to CHF clinic recently and Oxygen dependent continue torsemide. He reports that Zaroxolyn on hold per cards.  -monitor daily weights  ,   Filed Weights   01/23/18 0606 01/25/18 0600 01/27/18 0445  Weight: 95.3 kg 94.8 kg 95.3 kg   Increased edema in LE - resume usual dose demadex, increased output, wt steady 12. ABLA: Continue to monitor with serial checks as INR therapeutic now ,  Hgb stable 13. Hypokalemia:  supplement  3 times daily due to persistent hypokalemia recheck labs tomorrow 14. OSA: Wife to bring patient's machine from home to help compliance.     LOS: 11 days A FACE TO FACE EVALUATION WAS PERFORMED  Charlett Blake 01/29/2018, 8:18 AM

## 2018-01-29 NOTE — Telephone Encounter (Signed)
Patient called to cancel °

## 2018-01-29 NOTE — Progress Notes (Signed)
Physical Therapy Session Note  Patient Details  Name: [name] MRN: [id number] Date of Birth: [birth year]  Today's Date: 01/29/2018 PT Individual Time: 0712-1975 PT Individual Time Calculation (min): 60 min   Short Term Goals: Week 2:  PT Short Term Goal 1 (Week 2): =LTGs due to ELOS  Skilled Therapeutic Interventions/Progress Updates:    Pt seated in w/c upon PT arrival, agreeable to therapy tx and denies pain. Pt ambulated from room>gym x 150 ft with rollator and supervision. Pt took seated rest break and then ambulated x 200 ft with rollator and supervision working on endurance. Pt performed x 5 sit<>stands for strengthening using UEs to push up and without UEs for lowering, supervision. Pt transferred from sit>supine with supervision and worked on LE therex for strengthening, 2 x 10 each: bridges, marches, knee extension, ball squeezes and hip abduction. Pt transferred to sitting with supervision and ambulated to chair with rollator and supervision. This session pt participated in TUG testing, 31 sec for manual TUG and 34 sec for the cognitive TUG. Pt attempted to stand out of chair without arm rests, unable. Educated pt of being cautious of sitting in lower chairs/seats at home. Pt ambulated back to room with rollator and supervision x 150 ft. In room pt able to maintain standing balance without UE support and supervision while using bathroom. Pt left seated in w/c at end of session with needs in reach and chair alarm set.   Therapy Documentation Precautions:  Precautions Precautions: Fall Restrictions Weight Bearing Restrictions: No Other Position/Activity Restrictions: WBAT LLE    Therapy/Group: Individual Therapy  Netta Corrigan, PT, DPT 01/29/2018, 8:00 AM

## 2018-01-29 NOTE — Progress Notes (Signed)
Physical Therapy Session Note  Patient Details  Name: [name] MRN: [id number] Date of Birth: 12-22-1935  Today's Date: 01/29/2018 PT Individual Time: 1035-1130 PT Individual Time Calculation (min): 55 min   Short Term Goals: Week 2:  PT Short Term Goal 1 (Week 2): =LTGs due to ELOS  Skilled Therapeutic Interventions/Progress Updates:   Pt in w/c and agreeable to therapy, denies pain but reports LLE stiffness. Pt ambulated to/from therapy gym w/ supervision using rollator. Performed passive L quad and hamstring stretching prior to LE strengthening exercises, static hold at tolerable end range 30 sec x4 reps each muscle group. LLE strengthening exercises included mini-squats 3x10, sit<>stands w/ minimal UE support, 2" step ups 1x5, and 3" step ups 1x5. Worked on confidence w/ L weight bearing w/ L weight shifting tasks including R step-ups and LUE reaching tasks. Performed 5x sit<>stand, 52.79 sec, and 6 min walk test,  408' = 124 m. Educated pt on significance of results including increased fall risk and limited community mobility. Returned to room and ended session in w/c, all needs in reach.   Therapy Documentation Precautions:  Precautions Precautions: Fall Restrictions Weight Bearing Restrictions: No Other Position/Activity Restrictions: WBAT LLE  Therapy/Group: Individual Therapy  Tomas Schamp [name] 01/29/2018, 12:48 PM

## 2018-01-29 NOTE — Progress Notes (Signed)
Physical Therapy Discharge Summary  Patient Details  Name: [name] MRN: [id number] Date of Birth: 03-Aug-1935  Today's Date: 01/30/2018    Patient has met 4 of 5 long term goals due to improved activity tolerance, improved balance, improved postural control, increased strength, increased range of motion and decreased pain.  Patient to discharge at an ambulatory level Modified Independent.   Patient's care partner is independent to provide the necessary Supervision assistance at discharge. Pt's wife will be at home with pt most of the time during the day and providing supervision as needed for safety.   Reasons goals not met: Did not meet car transfer goal due to difficulty placing LLE in/out of car 2/2 weakness & elevated floor board on simulator car.  Recommendation:  Patient will benefit from ongoing skilled PT services in home health setting to continue to advance safe functional mobility, address ongoing impairments in strength, balance, progress gait with LRAD as appropriate, and minimize fall risk.  Equipment: No equipment provided, pt has multiple rollators at home  Reasons for discharge: treatment goals met  Patient/family agrees with progress made and goals achieved: Yes  PT Discharge Precautions/Restrictions Precautions Precautions: Fall Restrictions Weight Bearing Restrictions: No   Cognition Overall Cognitive Status: Within Functional Limits for tasks assessed Arousal/Alertness: Awake/alert Orientation Level: Oriented X4 Selective Attention: Appears intact Memory: Appears intact Awareness: Appears intact Safety/Judgment: Appears intact   Sensation Sensation Light Touch: Appears Intact Proprioception: Appears Intact Additional Comments: sensation grossly intact B LEs Coordination Gross Motor Movements are Fluid and Coordinated: No Fine Motor Movements are Fluid and Coordinated: No Coordination and Movement Description: impaired by generalized weakness    Motor  Motor Motor: Within Functional Limits Motor - Discharge Observations: generalized weakness, L LE weaker than R LE   Mobility Bed Mobility Bed Mobility: Sit to Supine;Supine to Sit Supine to Sit: Independent with assistive device Sit to Supine: Independent with assistive device Transfers Transfers: Sit to Stand;Stand Pivot Transfers Sit to Stand: Independent with assistive device Stand Pivot Transfers: Independent with assistive device Transfer (Assistive device): Rollator   Locomotion  Gait Ambulation: Yes Gait Assistance: Independent with assistive device Gait Distance (Feet): 150 Feet Assistive device: Rollator Gait Gait Pattern: Impaired Gait velocity: decreased Stairs / Additional Locomotion Stairs: Yes Stairs Assistance: Supervision/Verbal cueing Stair Management Technique: (rollator) Number of Stairs: 1 Height of Stairs: (6" curb) Ramp: Supervision/Verbal cueing(ambulatory with rollator) Curb: Supervision/Verbal cueing(with rollator) Product manager Mobility: No   Trunk/Postural Assessment  Cervical Assessment Cervical Assessment: Exceptions to WFL(forward head) Thoracic Assessment Thoracic Assessment: Within Functional Limits Lumbar Assessment Lumbar Assessment: Within Functional Limits Postural Control Postural Control: Within Functional Limits   Balance Balance Balance Assessed: Yes Standardized Balance Assessment Standardized Balance Assessment: Timed Up and Go Test Timed Up and Go Test TUG: Normal TUG;Cognitive TUG Normal TUG (seconds): 31.38 Cognitive TUG (seconds): 34.93 Dynamic Sitting Balance Dynamic Sitting - Level of Assistance: 6: Modified independent (Device/Increase time) Sitting balance - Comments: Mod I Static Standing Balance Static Standing - Level of Assistance: 6: Modified independent (Device/Increase time) Dynamic Standing Balance Dynamic Standing - Level of Assistance: 6: Modified independent  (Device/Increase time)   Extremity Assessment  RLE Assessment RLE Assessment: Within Functional Limits General Strength Comments: 4+/5 grossly LLE Assessment LLE Assessment: Exceptions to [facility] Passive Range of Motion (PROM) Comments: limited knee flex 2/2 pain and ACE wrap(knee flexion to 120 degrees) General Strength Comments: grossly 3- to 4/5 throughout    [name], PT, DPT 01/29/2018, 10:31 AM   Andrew  Sabra Beard, PT, DPT 01/30/2018, 3:44 PM

## 2018-01-29 NOTE — Progress Notes (Signed)
ANTICOAGULATION CONSULT NOTE  Pharmacy Consult for warfarin Indication: atrial fibrillation and mechanical aortic valve  Labs: Recent Labs    01/27/18 0652 01/28/18 0556 01/29/18 0507  HGB  --  9.2*  --   HCT  --  29.6*  --   PLT  --  144*  --   LABPROT 28.2* 25.3* 28.2*  INR 2.69 2.34 2.68  CREATININE 1.39* 1.34*  --     Estimated Creatinine Clearance: 51.1 mL/min (A) (by C-G formula based on SCr of 1.34 mg/dL (H)).  Assessment: Patient is an 82yoM on Coumadin 5mg  daily exc 7.5mg  MWF PTA for hx afib, mech valve. Bridged with Lovenox thru 10/20.  INR today is therapeutic (INR 2.68 << 2.34, goal of 2.5-3.5). Hgb/Hct stable, plts 144.    Goal of Therapy:  INR 2.5 - 3.5 Monitor platelets by anticoagulation protocol: Yes   Plan:  - Warfarin 7.5 mg x 1 dose at 1800 today - Will continue to monitor for any signs/symptoms of bleeding and will follow up with PT/INR in the a.m.   Thank you for allowing pharmacy to be a part of this patient's care.  Alycia Rossetti, PharmD, BCPS Clinical Pharmacist Pager: [phone] Clinical phone for 01/29/2018 from 7a-3:30p: [phone] If after 3:30p, please call main pharmacy at: [phone] Please check AMION for all [place] numbers 01/29/2018 8:53 AM

## 2018-01-29 NOTE — Progress Notes (Signed)
Social Work  Discharge Note  The overall goal for the admission was met for:   Discharge location: Yes-HOME WITH WIFE WHO CAN PROVIDE SUPERVISION LEVEL  Length of Stay: Yes-13 DAYS  Discharge activity level: Yes-SUPERVISION-INDEPENDENT WITH DEVICE  Home/community participation: Yes  Services provided included: MD, RD, PT, OT, RN, CM, Pharmacy and SW  Financial Services: Medicare and Private Insurance: [place]  Follow-up services arranged: Home Health: [facility]-PT,OT,RN and Patient/Family request agency HH: ACTIVE PATIENT, DME: NO NEEDS  Comments (or additional information):PT DID WELL AND REACHED MOD/I LEVEL WITH HIS ROLLATOR. HE FEELS READY TO GO HOME AND FEELS HE IS DOING BETTER THAN HE WAS PRIOR TO ADMISSION  Patient/Family verbalized understanding of follow-up arrangements: Yes  Individual responsible for coordination of the follow-up plan: GLORIA-WIFE AND SELF  Confirmed correct DME delivered: Elease Hashimoto 01/29/2018    Elease Hashimoto

## 2018-01-30 ENCOUNTER — Inpatient Hospital Stay: Payer: Medicare Other

## 2018-01-30 ENCOUNTER — Inpatient Hospital Stay (HOSPITAL_COMMUNITY): Payer: Self-pay | Admitting: Physical Therapy

## 2018-01-30 ENCOUNTER — Inpatient Hospital Stay: Payer: Medicare Other | Admitting: Hematology

## 2018-01-30 ENCOUNTER — Inpatient Hospital Stay (HOSPITAL_COMMUNITY): Payer: Self-pay

## 2018-01-30 ENCOUNTER — Inpatient Hospital Stay (HOSPITAL_COMMUNITY): Payer: Self-pay | Admitting: Occupational Therapy

## 2018-01-30 LAB — PROTIME-INR
INR: 2.73
PROTHROMBIN TIME: 28.5 s — AB (ref 11.4–15.2)

## 2018-01-30 MED ORDER — WARFARIN SODIUM 7.5 MG PO TABS
7.5000 mg | ORAL_TABLET | Freq: Once | ORAL | Status: AC
  Administered 2018-01-30: 7.5 mg via ORAL
  Filled 2018-01-30: qty 1

## 2018-01-30 NOTE — Progress Notes (Signed)
Occupational Therapy Session Note  Patient Details  Name: [name] MRN: [id number] Date of Birth: [birth year]  Today's Date: 01/30/2018 OT Individual Time: 1345-1425 OT Individual Time Calculation (min): 40 min    Short Term Goals: Week 2:  OT Short Term Goal 1 (Week 2): STG=LTG secondary to ELOS  Skilled Therapeutic Interventions/Progress Updates:    OT intervention with focus on functional amb with Rollator, BUE PROM/AAROM and strengthening, discharge planning, activity tolerance, and safety awareness.  Pt with limited B shoulder abduction/adduction <90. Instructed pt in stretching and strengthening activities to complete initially in supine before arising each morning and after each time resting in bed.  Pt verbalized understanding.  Pt returned to room and remained in w/c with all needs within reach.  Pt pleased with progress and ready for discharge home tomorrow.   Therapy Documentation Precautions:  Precautions Precautions: Fall Restrictions Weight Bearing Restrictions: No Other Position/Activity Restrictions: WBAT LLE Pain:  Pt c/o "tightness" in L shoulder; PROM, stretching, repositioning Exercises: Shoulder Exercises Shoulder Flexion: PROM;AAROM;Strengthening Shoulder Extension: PROM;AAROM;Strengthening Shoulder ABduction: PROM;AAROM;Strengthening Shoulder External Rotation: PROM;AAROM;Strengthening   Therapy/Group: Individual Therapy  [name] 01/30/2018, 2:42 PM

## 2018-01-30 NOTE — Progress Notes (Signed)
Occupational Therapy Discharge Summary  Patient Details  Name: [name] MRN: [id number] Date of Birth: 1935-12-14   Patient has met 9 of 9 long term goals due to improved activity tolerance, improved balance, postural control and improved coordination.  Pt made excellent progress with BADLs during this admission.  Pt completes all BADLs at independent level with AD.  Pt uses AE appropriately to complete bathing and dressing tasks.  Pt continues to fatigue quickly and requires multiple rest breaks during BADLs. Pt with limited BUE shoulder AROM (<90) and has been educated in BUE therex and BUE PROM. Pt's wife has been present for therapy and has verbalized understanding of recommendations.  Patient to discharge at overall Modified Independent level.  Patient's care partner is independent to provide the necessary physical assistance at discharge.     Recommendation:  Patient will benefit from ongoing skilled OT services in outpatient setting to continue to advance functional skills in the area of BADL, iADL and Reduce care partner burden.  Equipment: No equipment provided Pt owns BSC and shower seat  Reasons for discharge: treatment goals met and discharge from hospital  Patient/family agrees with progress made and goals achieved: Yes  OT Discharge Vision Baseline Vision/History: Wears glasses Wears Glasses: At all times Patient Visual Report: No change from baseline Vision Assessment?: No apparent visual deficits Perception  Perception: Within Functional Limits Praxis Praxis: Intact Cognition Overall Cognitive Status: Within Functional Limits for tasks assessed Arousal/Alertness: Awake/alert Orientation Level: Oriented X4 Attention: Selective Selective Attention: Appears intact Memory: Appears intact Awareness: Appears intact Problem Solving: Appears intact Safety/Judgment: Appears intact Sensation Sensation Light Touch: Appears Intact Hot/Cold: Appears  Intact Proprioception: Appears Intact Stereognosis: Not tested Coordination Gross Motor Movements are Fluid and Coordinated: No Fine Motor Movements are Fluid and Coordinated: No Coordination and Movement Description: impaired by generalized weakness Motor  Motor Motor: Within Functional Limits     Trunk/Postural Assessment  Cervical Assessment Cervical Assessment: Exceptions to WFL(forward head) Thoracic Assessment Thoracic Assessment: Within Functional Limits Lumbar Assessment Lumbar Assessment: Within Functional Limits Postural Control Postural Control: Within Functional Limits  Balance Dynamic Sitting Balance Dynamic Sitting - Level of Assistance: 6: Modified independent (Device/Increase time) Static Standing Balance Static Standing - Level of Assistance: 6: Modified independent (Device/Increase time) Dynamic Standing Balance Dynamic Standing - Level of Assistance: 6: Modified independent (Device/Increase time) Extremity/Trunk Assessment RUE Assessment RUE Assessment: Exceptions to [facility] Passive Range of Motion (PROM) Comments: 140 degrees Active Range of Motion (AROM) Comments: 90 degrees General Strength Comments: 3+/5 overall LUE Assessment LUE Assessment: Exceptions to [facility] Passive Range of Motion (PROM) Comments: 140 degrees Active Range of Motion (AROM) Comments: 90 degrees General Strength Comments: 3+/5 overall   [name] 01/30/2018, 11:27 AM

## 2018-01-30 NOTE — Progress Notes (Signed)
[place] for warfarin Indication: atrial fibrillation and mechanical aortic valve  Labs: Recent Labs    01/28/18 0556 01/29/18 0507 01/30/18 0609  HGB 9.2*  --   --   HCT 29.6*  --   --   PLT 144*  --   --   LABPROT 25.3* 28.2* 28.5*  INR 2.34 2.68 2.73  CREATININE 1.34*  --   --     Estimated Creatinine Clearance: 51.1 mL/min (A) (by C-G formula based on SCr of 1.34 mg/dL (H)).  Assessment: Patient is an 82yoM on Coumadin 5mg  daily exc 7.5mg  MWF PTA for hx afib, mech valve. Bridged with Lovenox thru 10/20.  INR today is therapeutic (INR 2.73 << 2.68, goal of 2.5-3.5). Hgb/Hct stable, plts 144.    Goal of Therapy:  INR 2.5 - 3.5 Monitor platelets by anticoagulation protocol: Yes   Plan:  - Warfarin 7.5 mg x 1 dose at 1800 today - Will continue to monitor for any signs/symptoms of bleeding and will follow up with PT/INR in the a.m.   Thank you for allowing pharmacy to be a part of this patient's care.  Alycia Rossetti, PharmD, BCPS Clinical Pharmacist Pager: [phone] Clinical phone for 01/30/2018 from 7a-3:30p: [phone] If after 3:30p, please call main pharmacy at: [phone] Please check AMION for all [place] numbers 01/30/2018 9:42 AM

## 2018-01-30 NOTE — Progress Notes (Signed)
Occupational Therapy Session Note  Patient Details  Name: [name] MRN: [id number] Date of Birth: May 18, 1935  Today's Date: 01/30/2018 OT Individual Time: 1106-1200 OT Individual Time Calculation (min): 54 min    Short Term Goals: Week 2:  OT Short Term Goal 1 (Week 2): STG=LTG secondary to ELOS  Skilled Therapeutic Interventions/Progress Updates:    Pt seen for OT session focusing on UE strengthening and ROM. Pt sitting up in w/c upon arrival, denying pain and agreeable to tx session.  He ambulated throughout session with rollator and distant supervision-mod I level.  In therapy gym, positioned supine on mat. Completed bi-manual strengthening and ROM exercises utilizing #2 dowel rod on first 2 sets and #4 rod on last set. Instructed in shoulder pres, overhead press,  And rotational exercises. Completed x3 sets of 10 with rest breaks required btwn sets. Education provided regarding importance of proper form and technique with exercises, breathing patterns, and exercise quality over quantity with exercises as part of HEP.   Following rest break, completed standing towel pushes focusing on shoulder flexion and rotation. Completed x10 in all planes. Following next seated rest break, ambulated to room in same manner as described above.  Pt reports feeling better following stretches and ROM. Pt left seated in w/c at end of session with wife present.   Therapy Documentation Precautions:  Precautions Precautions: Fall Restrictions Weight Bearing Restrictions: No Other Position/Activity Restrictions: WBAT LLE Pain:   No/denies pain   Therapy/Group: Individual Therapy  Fayette Gasner L 01/30/2018, 7:18 AM

## 2018-01-30 NOTE — Progress Notes (Addendum)
Physical Therapy Session Note  Patient Details  Name: [name] MRN: [id number] Date of Birth: 1936-04-01  Today's Date: 01/30/2018 PT Individual Time: 8416-6063 PT Individual Time Calculation (min): 40 min   Short Term Goals: Week 2:  PT Short Term Goal 1 (Week 2): =LTGs due to ELOS  Skilled Therapeutic Interventions/Progress Updates:  Pt received in w/c & agreeable to tx, no c/o pain only noting stiffness in LLE. Pt ambulates around unit with rollator and mod I, negotiates ramp & uneven surface with rollator and supervision, curb with rollator and supervision, and completes bed mobility in apartment with mod I. Pt requires min assist to place LLE in/out of car set at SUV simulated height due to elevated floor board on simulator car. Pt utilized cybex kinetron in sitting with task focusing on BLE strengthening. Pt also performed standing hip flexion exercises with BUE support on rollator for BLE strengthening. Pt voices no questions/concerns regarding d/c home tomorrow. Therapist educates him to ensure clear pathways to reduce tripping hazards. At end of session pt left sitting in w/c in room with all needs in reach.  Therapy Documentation Precautions:  Precautions Precautions: Fall Restrictions Weight Bearing Restrictions: No Other Position/Activity Restrictions: WBAT LLE   Therapy/Group: Individual Therapy  Waunita Schooner 01/30/2018, 3:37 PM

## 2018-01-30 NOTE — Plan of Care (Signed)
  Problem: RH SKIN INTEGRITY Goal: RH STG SKIN FREE OF INFECTION/BREAKDOWN Outcome: Progressing  Maintain skin integrity

## 2018-01-30 NOTE — Progress Notes (Signed)
[facility] PHYSICAL MEDICINE & REHABILITATION PROGRESS NOTE   Subjective/Complaints:  No SOB, has freq urination  ROS: Patient denies, nausea, vomiting, diarrhea, cough, shortness of breath or chest pain, joint or back pain, headache, or mood change.    Objective:   No results found. Recent Labs    01/28/18 0556  WBC 3.8*  HGB 9.2*  HCT 29.6*  PLT 144*   Recent Labs    01/28/18 0556  NA 135  K 4.2  CL 100  CO2 28  GLUCOSE 108*  BUN 45*  CREATININE 1.34*  CALCIUM 8.9    Intake/Output Summary (Last 24 hours) at 01/30/2018 0910 Last data filed at 01/30/2018 0846 Gross per 24 hour  Intake 644 ml  Output 2250 ml  Net -1606 ml     Physical Exam: Vital Signs Blood pressure 123/72, pulse 72, temperature 97.6 F (36.4 C), temperature source Oral, resp. rate 15, weight 93 kg, SpO2 99 %.  Constitutional: No distress . Vital signs reviewed. HEENT: EOMI, oral membranes moist Neck: supple Cardiovascular: RRR without murmur. No JVD    Respiratory: CTA Bilaterally without wheezes or rales. Normal effort    GI: BS +, non-tender, non-distended  Extremities: No clubbing, cyanosis,  Skin: No evidence of breakdown, no evidence of rash Neurologic: Cranial nerves II through XII intact, motor strength is 5/5 in bilateral deltoid, bicep, tricep, grip, hip flexor, knee extensors, ankle dorsiflexor and plantar flexor Sensory exam normal sensation to light touch and proprioception in bilateral upper and lower extremities Cerebellar exam normal finger to nose to finger as well as heel to shin in bilateral upper and lower extremities Musculoskeletal: Reduce overhead reaching BUE.no swelling Ext pre tib edema increased to 2+      Care Tool:  Bathing     Body parts bathed by patient: Right arm, Left arm, Chest, Abdomen, Front perineal area, Right upper leg, Buttocks, Left upper leg, Face, Right lower leg, Left lower leg   Body parts bathed by helper: Right lower leg Body  parts n/a: Right lower leg   Bathing assist Assist Level: Independent with assistive device     Upper Body Dressing/Undressing Upper body dressing   What is the patient wearing?: Pull over shirt    Upper body assist Assist Level: Independent    Lower Body Dressing/Undressing Lower body dressing      What is the patient wearing?: Pants, Incontinence brief     Lower body assist Assist for lower body dressing: Independent with assitive device     Toileting Toileting    Toileting assist Assist for toileting: Independent with assistive device Assistive Device Comment: (emptied urinal for pt)   Transfers Chair/bed transfer  Transfers assist     Chair/bed transfer assist level: Independent with assistive device     Locomotion Ambulation   Ambulation assist      Assist level: Supervision/Verbal cueing Assistive device: Rollator Max distance: 150'   Walk 10 feet activity   Assist     Assist level: Supervision/Verbal cueing Assistive device: Rollator   Walk 50 feet activity   Assist Walk 50 feet with 2 turns activity did not occur: Safety/medical concerns  Assist level: Supervision/Verbal cueing Assistive device: Rollator    Walk 150 feet activity   Assist Walk 150 feet activity did not occur: Safety/medical concerns  Assist level: Supervision/Verbal cueing Assistive device: Rollator    Walk 10 feet on uneven surface  activity   Assist Walk 10 feet on uneven surfaces activity did not occur: Safety/medical concerns  Wheelchair     Assist Will patient use wheelchair at discharge?: No Type of Wheelchair: Manual    Wheelchair assist level: Supervision/Verbal cueing Max wheelchair distance: 150'    Wheelchair 50 feet with 2 turns activity    Assist        Assist Level: Supervision/Verbal cueing   Wheelchair 150 feet activity     Assist     Assist Level: Supervision/Verbal cueing    Assessment/Plan: 1.  Functional deficits secondary to debility following Left thigh hematoma which require 3+ hours per day of interdisciplinary therapy in a comprehensive inpatient rehab setting.  Physiatrist is providing close team supervision and 24 hour management of active medical problems listed below.  Physiatrist and rehab team continue to assess barriers to discharge/monitor patient progress toward functional and medical goals Medical Problem List and Plan:  1. Functional and mobility deficits secondary to left thigh hematoma and debility  --Continue CIR therapies including PT, OT  Team conference today please see physician documentation under team conference tab, met with team face-to-face to discuss problems,progress, and goals. Formulized individual treatment plan based on medical history, underlying problem and comorbidities. 2. DVT Prophylaxis/Anticoagulation: Pharmaceutical: Coumadin and INR therapeutic 2.36 3. Pain Management: Tylenol as needed  4. Mood: LCSW to follow for evaluation and support  5. Neuropsych: This patient is capable of making decisions on his own behalf.  6. Skin/Wound Care: Damp to dry dressing to  left thigh to remove fibronecrotic debris.  -Air mattress for pressure relief due to MAST with question blistering. Maintain adequate nutritional status.  7. Fluids/Electrolytes/Nutrition: Monitor I's and O's. Serial check of labs. Add protein supplements for protein calorie malnutrition   I and O's remain negative but intake increasing 8. CAD s/p AVR/CAF/severe MR: On Coumadin, amiodarone and torsemide. Monitor heart rate twice daily  9. LLE hematoma with edema: Continue compressive right wraps as well as elevation.    11. Chronic diastolic CHF: Has been referred to CHF clinic recently and Oxygen dependent continue torsemide. He reports that Zaroxolyn on hold per cards.  -monitor daily weights  ,                Last Weight  Most recent update: 01/30/2018  5:16 AM   Weight  93 kg  (205 lb)           Increased edema in LE - resume usual dose demadex, increased output, wt lower 12. ABLA: Continue to monitor with serial checks as INR therapeutic now , Hgb stable 13. Hypokalemia:  supplement  3 times daily due to persistent hypokalemia recheck labs tomorrow 14. OSA: Wife to bring patient's machine from home to help compliance.     LOS: 12 days A FACE TO FACE EVALUATION WAS PERFORMED  Charlett Blake 01/30/2018, 9:10 AM

## 2018-01-30 NOTE — Progress Notes (Signed)
Social Work Patient ID: [name], male   DOB: November 13, 1935, 82 y.o.   MRN: [id number]  Met whit pt to discuss team conference goals readiness for discharge tomorrow. He feels very ready and is stronger now than he was prior to admission to the hospital. See in am, has all equipment and home health services to resume.

## 2018-01-30 NOTE — Progress Notes (Signed)
Home CPAP at bedside. Pt to notify for RT if any further assistance is needed.

## 2018-01-30 NOTE — Progress Notes (Signed)
Occupational Therapy Session Note  Patient Details  Name: [name] MRN: [id number] Date of Birth: 26-Jan-1936  Today's Date: 01/30/2018 OT Individual Time: 0800-0900 OT Individual Time Calculation (min): 60 min    Short Term Goals: Week 2:  OT Short Term Goal 1 (Week 2): STG=LTG secondary to ELOS  Skilled Therapeutic Interventions/Progress Updates:    OT intervention with focus on bathing/dressing, functional amb in room with [name], toileting, functional transfers, safety awareness and activity tolerance.  Pt completed all bathing (shower level) and dressing tasks at independent level with AD PRN. Pt uses long handle sponge in shower to aid with bathing feet.  Pt requested to use toilet X 2 during session and completed all tasks at independent level with use of grab bars and Rollator.  Pt pleased with progress and happy to be discharging home tomorrow. Pt remained in w/c with all needs within reach.   Therapy Documentation Precautions:  Precautions Precautions: Fall Restrictions Weight Bearing Restrictions: No Other Position/Activity Restrictions: WBAT LLE Pain:  Pt denies pain   Therapy/Group: Individual Therapy  [name] 01/30/2018, 9:01 AM

## 2018-01-30 NOTE — Patient Care Conference (Signed)
Inpatient RehabilitationTeam Conference and Plan of Care Update Date: 01/30/2018   Time: 11:15 AM    Patient Name: [name]      Medical Record Number: [id number]  Date of Birth: [birth year] Sex: Male         Room/Bed: 4W20C/4W20C-01 Payor Info: Payor: MEDICARE / Plan: MEDICARE PART A AND B / Product Type: *No Product type* /    Admitting Diagnosis: debility  Admit Date/Time:  01/18/2018 11:25 AM Admission Comments: No comment available   Primary Diagnosis:  <principal problem not specified> Principal Problem: <principal problem not specified>  Patient Active Problem List   Diagnosis Date Noted  . Debility 01/18/2018  . Anemia 01/09/2018  . Leg pain, anterior, left 01/07/2018  . Dysuria 01/07/2018  . Severe anemia 12/23/2017  . Supratherapeutic INR 12/23/2017  . Hyponatremia 12/23/2017  . Coronary artery disease with history of myocardial infarction without history of CABG 12/23/2017  . Bleeding from mouth 12/20/2017  . Post-op pain 12/12/2017  . Dental caries 12/12/2017  . Severe mitral regurgitation 11/14/2017  . Pressure injury of skin 10/29/2017  . Shortness of breath   . Palliative care by specialist   . RVF (right ventricular failure) ([place])   . Right heart failure ([place]) 10/18/2017  . Hypoxia   . COPD exacerbation ([place]) 08/01/2017  . Pancytopenia ([place]) 08/01/2017  . Epistaxis 07/11/2017  . Actinic keratoses 04/05/2017  . Leg wound, right 02/18/2016  . Edema 02/18/2016  . Chronic anticoagulation 02/18/2016  . Hyperkalemia 07/25/2015  . Atherosclerosis of native arteries of extremity with intermittent claudication ([place]) 10/21/2014  . Thrombocytopenia ([place]) 10/20/2014  . Macrocytosis without anemia 10/20/2014  . OSA (obstructive sleep apnea) 05/15/2013  . Goals of care, counseling/discussion 05/01/2013  . History of mechanical aortic valve replacement 01/02/2013  . Obesity (BMI 30-39.9) 10/20/2012  . Type II diabetes mellitus with complication ([place]) 76/28/3151   . Acute asthmatic bronchitis 01/18/2012  . ACUTE KIDNEY FAILURE UNSPECIFIED 02/04/2009  . CUTANEOUS ERUPTIONS, DRUG-INDUCED 02/04/2009  . HLD (hyperlipidemia) 02/03/2009  . Essential hypertension 02/03/2009  . Coronary atherosclerosis 02/03/2009  . Atrial fibrillation ([place]) 02/03/2009  . Chronic diastolic heart failure ([place]) 02/03/2009  . CAROTID ENDARTERECTOMY, LEFT, HX OF 02/03/2009  . INF&INFLAM REACT DUE CARD DEVICE IMPLANT&GRAFT 12/30/2008    Expected Discharge Date: Expected Discharge Date: 01/31/18  Team Members Present: Physician leading conference: Dr. Alysia Penna Social Worker Present: Ovidio Kin, LCSW Nurse Present: Rayetta Pigg, RN PT Present: Leavy Cella, PT OT Present: Willeen Cass, OT;Roanna Epley, COTA SLP Present: Weston Anna, SLP PPS Coordinator present : Daiva Nakayama, RN, CRRN     Current Status/Progress Goal Weekly Team Focus  Medical   Some increase in lower extremity edema, Demadex has been increased  Maintain medical stability, avoid fluid overload  Comanagement CHF, discharge planning   Bowel/Bladder   Continent of B/B LBM 10/28  remain continent of B/B  assist to BR q2h and prn   Swallow/Nutrition/ Hydration             ADL's   mod I for BADLs, supervision for functional transfers, assist with TED hose, fatigues quickly  independent with assistive device overall; supervision for bathing   discharge planning, edurance   Mobility   supervision overall, gait >150' w/ rollator, limited by fatigue at times  independent w/ AD  discharge planning, endurance and LE strengthening    Communication             Safety/Cognition/ Behavioral Observations  Pain   pt has pain in Left leg tylenol with relief  pain <=2/10  assess qshift and prn medicate prn   Skin   purple/black spot left lateral foot abrasion Left thigh MASD buttock (foam)  pt will be free of breakdown improvement of current deficit  assess skin qshift and prn      *See  Care Plan and progress notes for long and short-term goals.     Barriers to Discharge  Current Status/Progress Possible Resolutions Date Resolved   Physician    Medical stability     Progressing towards goals  Continue rehab      Nursing                  PT  Medical stability                 OT                  SLP                SW                Discharge Planning/Teaching Needs:  Pt making good progress in therapies, should reach goals by DC tomorrow. Wife can provide supervision level if needed.      Team Discussion:  Progressing toward his mod/i level goals. Has gotten stronger in his legs and his endurance is improve. CHF stable and back on his home fluid pill. Leg pain at times but is manageable. Made mod/i in room today prior to DC tomorrow. Skin issues -nursing is addressing. Will have follow up RN at home via [facility].  Revisions to Treatment Plan:  DC 10/31    Continued Need for Acute Rehabilitation Level of Care: The patient requires daily medical management by a physician with specialized training in physical medicine and rehabilitation for the following conditions: Daily direction of a multidisciplinary physical rehabilitation program to ensure safe treatment while eliciting the highest outcome that is of practical value to the patient.: Yes Daily medical management of patient stability for increased activity during participation in an intensive rehabilitation regime.: Yes Daily analysis of laboratory values and/or radiology reports with any subsequent need for medication adjustment of medical intervention for : Cardiac problems;Other   I attest that I was present, lead the team conference, and concur with the assessment and plan of the team.   Elease Hashimoto 01/30/2018, 11:45 AM

## 2018-01-31 ENCOUNTER — Encounter (HOSPITAL_COMMUNITY): Payer: Self-pay | Admitting: *Deleted

## 2018-01-31 DIAGNOSIS — N189 Chronic kidney disease, unspecified: Secondary | ICD-10-CM

## 2018-01-31 DIAGNOSIS — N179 Acute kidney failure, unspecified: Secondary | ICD-10-CM

## 2018-01-31 LAB — PROTIME-INR
INR: 2.59
PROTHROMBIN TIME: 27.4 s — AB (ref 11.4–15.2)

## 2018-01-31 MED ORDER — WARFARIN SODIUM 5 MG PO TABS: ORAL_TABLET | ORAL | 0 refills | Status: DC

## 2018-01-31 MED ORDER — AMIODARONE HCL 200 MG PO TABS: 200.0000 mg | ORAL_TABLET | Freq: Every day | ORAL | 0 refills | Status: DC

## 2018-01-31 MED ORDER — WARFARIN SODIUM 7.5 MG PO TABS: 7.5000 mg | ORAL_TABLET | Freq: Once | ORAL | Status: DC

## 2018-01-31 MED ORDER — TORSEMIDE 100 MG PO TABS: 100.0000 mg | ORAL_TABLET | Freq: Two times a day (BID) | ORAL | 0 refills | Status: DC

## 2018-01-31 MED ORDER — POTASSIUM CHLORIDE CRYS ER 20 MEQ PO TBCR: 40.0000 meq | EXTENDED_RELEASE_TABLET | Freq: Two times a day (BID) | ORAL | 0 refills | Status: DC

## 2018-01-31 MED ORDER — WARFARIN SODIUM 7.5 MG PO TABS: ORAL_TABLET | ORAL | 0 refills | Status: DC

## 2018-01-31 MED ORDER — ACETAMINOPHEN 325 MG PO TABS: 325.0000 mg | ORAL_TABLET | ORAL | Status: DC | PRN

## 2018-01-31 MED ORDER — SENNOSIDES-DOCUSATE SODIUM 8.6-50 MG PO TABS: 1.0000 | ORAL_TABLET | Freq: Two times a day (BID) | ORAL | 0 refills | Status: DC

## 2018-01-31 NOTE — Progress Notes (Signed)
[place] for warfarin Indication: atrial fibrillation and mechanical aortic valve  Labs: Recent Labs    01/29/18 0507 01/30/18 0609 01/31/18 0525  LABPROT 28.2* 28.5* 27.4*  INR 2.68 2.73 2.59    Estimated Creatinine Clearance: 51.1 mL/min (A) (by C-G formula based on SCr of 1.34 mg/dL (H)).  Assessment: Patient is an 82yoM on Coumadin 5mg  daily exc 7.5mg  MWF PTA for hx afib, mech valve. Bridged with Lovenox thru 10/20.  INR today is therapeutic (INR 2.59, goal of 2.5-3.5).   Goal of Therapy:  INR 2.5 - 3.5 Monitor platelets by anticoagulation protocol: Yes   Plan:  - Warfarin 7.5 mg x 1 dose at 1800 today - Will continue to monitor for any signs/symptoms of bleeding and will follow up with PT/INR in the a.m.   Onnie Boer, PharmD, [place], AAHIVP, CPP Infectious Disease Pharmacist Pager: [phone] 01/31/2018 9:25 AM

## 2018-01-31 NOTE — Progress Notes (Signed)
Pt was referred to Dr Gerlene Fee at [facility] for percutaneous MV repair.  Records were faxed to [facility] at [phone] on 01/15/18.  Disc of pt's TEE mailed to her at [place] [id number] on 01/31/18.

## 2018-01-31 NOTE — Progress Notes (Signed)
[place] PHYSICAL MEDICINE & REHABILITATION PROGRESS NOTE   Subjective/Complaints:  Feels ok, no new issues  ROS: Patient denies, nausea, vomiting, diarrhea, cough, shortness of breath or chest pain, joint or back pain, headache, or mood change.    Objective:   No results found. No results for input(s): WBC, HGB, HCT, PLT in the last 72 hours. No results for input(s): NA, K, CL, CO2, GLUCOSE, BUN, CREATININE, CALCIUM in the last 72 hours.  Intake/Output Summary (Last 24 hours) at 01/31/2018 0811 Last data filed at 01/31/2018 0537 Gross per 24 hour  Intake 680 ml  Output 2050 ml  Net -1370 ml     Physical Exam: Vital Signs Blood pressure 102/65, pulse 64, temperature 98.1 F (36.7 C), resp. rate 18, weight 94 kg, SpO2 96 %.  Constitutional: No distress . Vital signs reviewed. HEENT: EOMI, oral membranes moist Neck: supple Cardiovascular: RRR without murmur. No JVD    Respiratory: CTA Bilaterally without wheezes or rales. Normal effort    GI: BS +, non-tender, non-distended  Extremities: No clubbing, cyanosis,  Skin: No evidence of breakdown, no evidence of rash Neurologic: Cranial nerves II through XII intact, motor strength is 5/5 in bilateral deltoid, bicep, tricep, grip, hip flexor, knee extensors, ankle dorsiflexor and plantar flexor Sensory exam normal sensation to light touch and proprioception in bilateral upper and lower extremities Cerebellar exam normal finger to nose to finger as well as heel to shin in bilateral upper and lower extremities Musculoskeletal: Reduce overhead reaching BUE.no swelling Ext pre tib edema increased to 2+      Care Tool:  Bathing     Body parts bathed by patient: Right arm, Left arm, Chest, Abdomen, Front perineal area, Right upper leg, Buttocks, Left upper leg, Face, Right lower leg, Left lower leg   Body parts bathed by helper: Right lower leg Body parts n/a: Right lower leg   Bathing assist Assist Level: Independent  with assistive device     Upper Body Dressing/Undressing Upper body dressing   What is the patient wearing?: Pull over shirt    Upper body assist Assist Level: Independent    Lower Body Dressing/Undressing Lower body dressing      What is the patient wearing?: Pants, Incontinence brief     Lower body assist Assist for lower body dressing: Independent with assitive device     Toileting Toileting    Toileting assist Assist for toileting: Independent with assistive device Assistive Device Comment: urinal   Transfers Chair/bed transfer  Transfers assist     Chair/bed transfer assist level: Independent with assistive device Chair/bed transfer assistive device: Programmer, multimedia   Ambulation assist      Assist level: Independent with assistive device Assistive device: Rollator Max distance: 150 ft   Walk 10 feet activity   Assist     Assist level: Independent with assistive device Assistive device: Rollator   Walk 50 feet activity   Assist Walk 50 feet with 2 turns activity did not occur: Safety/medical concerns  Assist level: Independent with assistive device Assistive device: Rollator    Walk 150 feet activity   Assist Walk 150 feet activity did not occur: Safety/medical concerns  Assist level: Independent with assistive device Assistive device: Rollator    Walk 10 feet on uneven surface  activity   Assist Walk 10 feet on uneven surfaces activity did not occur: Safety/medical concerns   Assist level: Supervision/Verbal cueing Assistive device: Hydrologist Will  patient use wheelchair at discharge?: No Type of Wheelchair: Manual Wheelchair activity did not occur: Safety/medical concerns  Wheelchair assist level: Supervision/Verbal cueing Max wheelchair distance: 150'    Wheelchair 50 feet with 2 turns activity    Assist    Wheelchair 50 feet with 2 turns activity did not occur:  Safety/medical concerns   Assist Level: Supervision/Verbal cueing   Wheelchair 150 feet activity     Assist Wheelchair 150 feet activity did not occur: Safety/medical concerns   Assist Level: Supervision/Verbal cueing    Assessment/Plan:  1. Functional deficits secondary to debility following Left thigh hematoma  Stable for D/C today F/u PCP in 3-4 weeks No PMR f/u needed See D/C summary  See D/C instructions Medical Problem List and Plan:  1. Functional and mobility deficits secondary to left thigh hematoma and debility  --Continue CIR therapies including PT, OT  D/C home today 2. DVT Prophylaxis/Anticoagulation: Pharmaceutical: Coumadin and INR therapeutic 2.36 3. Pain Management: Tylenol as needed  4. Mood: LCSW to follow for evaluation and support  5. Neuropsych: This patient is capable of making decisions on his own behalf.  6. Skin/Wound Care: Damp to dry dressing to  left thigh to remove fibronecrotic debris.  -Air mattress for pressure relief due to MAST with question blistering. Maintain adequate nutritional status.  7. Fluids/Electrolytes/Nutrition: Monitor I's and O's. Serial check of labs. Add protein supplements for protein calorie malnutrition   I and O's remain negative but intake increasing 8. CAD s/p AVR/CAF/severe MR: On Coumadin, amiodarone and torsemide. Monitor heart rate twice daily  9. LLE hematoma with edema: Continue compressive right wraps as well as elevation.    11. Chronic diastolic CHF: Has been referred to CHF clinic recently and Oxygen dependent continue torsemide. He reports that Zaroxolyn on hold per cards.  -monitor daily weights  ,                Last Weight  Most recent update: 01/31/2018  5:37 AM   Weight  94 kg (207 lb 3.7 oz)           Increased edema in LE - resume usual dose demadex, increased output, wt lower 12. ABLA: Continue to monitor with serial checks as INR therapeutic now , Hgb stable 13. Hypokalemia:  supplement  3  times daily due to persistent hypokalemia recheck labs tomorrow 14. OSA: Wife to bring patient's machine from home to help compliance.     LOS: 13 days A FACE TO FACE EVALUATION WAS PERFORMED  Charlett Blake 01/31/2018, 8:11 AM

## 2018-01-31 NOTE — Discharge Summary (Signed)
Physician Discharge Summary  Patient ID: [name] MRN: [id number] DOB/AGE: 80/07/1935 80 y.o.  Admit date: 01/18/2018 Discharge date: 01/31/2018  Discharge Diagnoses:  Principal Problem:   Debility Active Problems:   Atrial fibrillation (HCC)   Chronic diastolic heart failure (HCC)   Thrombocytopenia (HCC)   Peripheral edema   Acute on chronic renal failure [facility])   Discharged Condition:  stable  Significant Diagnostic Studies: n/a   Labs:  Basic Metabolic Panel: Recent Labs  Lab 01/25/18 0535 01/27/18 0652 01/28/18 0556  NA 135 135 135  K 3.5 3.7 4.2  CL 96* 97* 100  CO2 29 27 28   GLUCOSE 106* 113* 108*  BUN 48* 48* 45*  CREATININE 1.35* 1.39* 1.34*  CALCIUM 8.8* 9.0 8.9    CBC: CBC Latest Ref Rng & Units 01/28/2018 01/19/2018 01/18/2018  WBC 4.0 - 10.5 K/uL 3.8(L) 3.7(L) 4.9  Hemoglobin 13.0 - 17.0 g/dL 9.2(L) 9.1(L) 8.9(L)  Hematocrit 39.0 - 52.0 % 29.6(L) 30.2(L) 27.6(L)  Platelets 150 - 400 K/uL 144(L) 223 224    CBG: No results for input(s): GLUCAP in the last 168 hours.  Brief HPI:   [name] is an 82 year old male with history of CAD S/P CABG/[employer] AVR/CHF on chronic Coumadin, T2DM, OSA, CHF with multiple recent admissions for acute on chronic exacerbation as well as recent admission for ABLA post dental extraction.  He was noted to have supratherapeutic INR on recent pro time check at MD office and was treated with oral vitamin K.  He was readmitted on 01/09/2018 with hematuria, INR of 3.8, hemoglobin 5.6 and significant left lower extremity edema with pain.  Work-up revealed intramuscular hematoma in left vastus lateralis-22 cm x 10.8 cm x 6.6 cm.  He was treated with a total of 3 units of FFP and transfused with 6 total units PRBC.  Dr. Doreatha Martin was consulted for input and recommended compressive wraps as needed no neurovascular compromise noted.  He also felt that hematoma would likely take weeks to resolve.  His H&H H has been monitored  and was noted to be stable.  Cardiology was consulted and has been following for management of left lower extremity edema as well as hypokalemia.  Coumadin was resumed with Lovenox bridge with INR goals recommended 2.5-3 range.  Therapy evaluations done and patient noted to be deconditioning with functional decline and CIR was recommended for follow-up therapy   Hospital Course: [name] was admitted to rehab 01/18/2018 for inpatient therapies to consist of PT, ST and OT at least three hours five days a week. Past admission physiatrist, therapy team and rehab RN have worked together to provide customized collaborative inpatient rehab.  He was maintained on Lovenox bridge until INR greater than 2.5.  Serial H&H has been monitored during the stay and has been stable.  Compressive LAD wraps were used for management of left lower extremity edema and this has greatly improved.  He has been monitored for signs of overload and weights have been checked daily.  Weights weights have been variable during the stay and is at 207 pounds at discharge.  His respiratory status has improved and he has been weaned off of oxygen during the stay.  He is continent of bowel and bladder.  Serial CBC shows H&H is improving and mild neutropenia/thrombocytopenia noted. UA was reported due to reports of malodorous and cloudy urine.  This showed decrease in hematuria with persistent leukocytes but patient without S/S of infection. Serial check of lytes showed that acute on chronic  renal failure is improving. Pharmacy has been assisting with coumadin management and INR is therapeutic at discharge. Patient discharged on 5 mg TTSS and 7.5 mg on MWF. He has made good gains during his rehab stay and has progressed to intermittent supervision level. He will continue to receive follow up HHPT, [place] and [place] by [place] after discharge.    Rehab course: During patient's stay in rehab weekly team conferences were held to monitor  patient's progress, set goals and discuss barriers to discharge. At admission, patient required mod assist with mobility and min to max assist with basic self-care task.  He has had improvement in activity tolerance, balance, postural control as well as ability to compensate for deficits.  He has made excellent gains during his rehab stay and is currently modified independent for ADL tasks.  He is modified independent for transfers and to ambulate 150' with RW.  He requires multiple rest breaks due to endurance issues.family education has been completed regarding all aspects of care.     Disposition: Home  Diet: Heart healthy/carb modified diet  Special Instructions: 1.  Monitor weights daily. 2.  Monitor blood sugars to 2-3 times a day and follow-up with endocrinology for input on addition of medication. 3. HHRN to check Protime, BMET and CBC on 11/4 with results to coumadin clinic and primary MD.    Allergies as of 01/31/2018      Reactions   Rifampin Rash   May have been caused by Vancomycin or Rifampin (??)   Vancomycin Rash   May have been caused by Vancomycin or Rifampin (??)      Medication List    STOP taking these medications   cefUROXime 250 MG tablet Commonly known as:  CEFTIN   enoxaparin 100 MG/ML injection Commonly known as:  LOVENOX   Potassium Chloride ER 20 MEQ Tbcr     TAKE these medications   acetaminophen 325 MG tablet Commonly known as:  TYLENOL Take 1-2 tablets (325-650 mg total) by mouth every 4 (four) hours as needed for mild pain.   allopurinol 100 MG tablet Commonly known as:  ZYLOPRIM Take 1 tablet (100 mg total) by mouth daily.   amiodarone 200 MG tablet Commonly known as:  PACERONE Take 1 tablet (200 mg total) by mouth daily.   ezetimibe-simvastatin 10-20 MG tablet Commonly known as:  VYTORIN Take 1 tablet by mouth at bedtime.   metolazone 2.5 MG tablet Commonly known as:  ZAROXOLYN Take 1 tablet (2.5 mg total) by mouth every  Monday. Notes to patient:  ON  Mondays   potassium chloride SA 20 MEQ tablet Commonly known as:  K-DUR,KLOR-CON Take 2 tablets (40 mEq total) by mouth 2 (two) times daily.   senna-docusate 8.6-50 MG tablet Commonly known as:  Senokot-S Take 1 tablet by mouth 2 (two) times daily.   torsemide 100 MG tablet Commonly known as:  DEMADEX Take 1 tablet (100 mg total) by mouth 2 (two) times daily.   warfarin 7.5 MG tablet Commonly known as:  COUMADIN Take as directed. If you are unsure how to take this medication, talk to your nurse or doctor. Original instructions:  On Monday, Wednesday and Friday with supper What changed:  You were already taking a medication with the same name, and this prescription was added. Make sure you understand how and when to take each.   warfarin 5 MG tablet Commonly known as:  COUMADIN Take as directed. If you are unsure how to take this medication, talk  to your nurse or doctor. Original instructions:  One pill daily on Tue, Thurs, Sat, Sun What changed:    how much to take  how to take this  when to take this  additional instructions      Follow-up Information    [name], [name], MD Follow up on 02/19/2018.   Specialty:  Internal Medicine Why:  Appointment @ 3:00 PM Contact information: [place] [address] [phone]        Belva Crome, MD .   Specialty:  Cardiology Contact information: [address] [address] [phone]        Charlett Blake, MD Follow up.   Specialty:  Physical Medicine and Rehabilitation Why:  as needed Contact information: [place] [address] [phone]           Signed: Bary Leriche 01/31/2018, 11:58 PM

## 2018-01-31 NOTE — Progress Notes (Signed)
Patient is scheduled for Discharge today. Discharge orders have been placed. Discharge Instructions provided by P. Love, PA. Patient Discharged to Home with AVS and personal belongings. No s/s acute distress noted.

## 2018-01-31 NOTE — Plan of Care (Signed)
  Problem: Consults Goal: RH GENERAL PATIENT EDUCATION Description See Patient Education module for education specifics. Outcome: Adequate for Discharge Goal: Skin Care Protocol Initiated - if Braden Score 18 or less Description If consults are not indicated, leave blank or document N/A Outcome: Adequate for Discharge Goal: Nutrition Consult-if indicated Outcome: Adequate for Discharge Goal: Diabetes Guidelines if Diabetic/Glucose > 140 Description If diabetic or lab glucose is > 140 mg/dl - Initiate Diabetes/Hyperglycemia Guidelines & Document Interventions  Outcome: Adequate for Discharge   Problem: RH BOWEL ELIMINATION Goal: RH STG MANAGE BOWEL WITH ASSISTANCE Description STG Manage Bowel with min Assistance.  Outcome: Adequate for Discharge Goal: RH STG MANAGE BOWEL W/MEDICATION W/ASSISTANCE Description STG Manage Bowel with Medication with min Assistance.  Outcome: Adequate for Discharge   Problem: RH BLADDER ELIMINATION Goal: RH STG MANAGE BLADDER WITH ASSISTANCE Description STG Manage Bladder With min  Assistance  Outcome: Adequate for Discharge Goal: RH STG MANAGE BLADDER WITH MEDICATION WITH ASSISTANCE Description STG Manage Bladder With Medication With min Assistance.  Outcome: Adequate for Discharge   Problem: RH SKIN INTEGRITY Goal: RH STG SKIN FREE OF INFECTION/BREAKDOWN Outcome: Adequate for Discharge Goal: RH STG MAINTAIN SKIN INTEGRITY WITH ASSISTANCE Description STG Maintain Skin Integrity With min  Assistance.  Outcome: Adequate for Discharge Goal: RH STG ABLE TO PERFORM INCISION/WOUND CARE W/ASSISTANCE Description STG Able To Perform Incision/Wound Care With min Assistance.  Outcome: Adequate for Discharge   Problem: RH SAFETY Goal: RH STG ADHERE TO SAFETY PRECAUTIONS W/ASSISTANCE/DEVICE Description STG Adhere to Safety Precautions With Assistance/Device. Outcome: Adequate for Discharge Goal: RH STG DECREASED RISK OF FALL WITH  ASSISTANCE Description STG Decreased Risk of Fall With Assistance. Outcome: Adequate for Discharge   Problem: RH PAIN MANAGEMENT Goal: RH STG PAIN MANAGED AT OR BELOW PT'S PAIN GOAL Outcome: Adequate for Discharge   Problem: RH KNOWLEDGE DEFICIT GENERAL Goal: RH STG INCREASE KNOWLEDGE OF SELF CARE AFTER HOSPITALIZATION Outcome: Adequate for Discharge

## 2018-02-01 ENCOUNTER — Telehealth: Payer: Self-pay | Admitting: Pharmacist

## 2018-02-01 ENCOUNTER — Telehealth (HOSPITAL_COMMUNITY): Payer: Self-pay | Admitting: Physical Medicine and Rehabilitation

## 2018-02-01 ENCOUNTER — Other Ambulatory Visit (HOSPITAL_COMMUNITY): Payer: Self-pay | Admitting: *Deleted

## 2018-02-01 DIAGNOSIS — I5033 Acute on chronic diastolic (congestive) heart failure: Secondary | ICD-10-CM | POA: Diagnosis not present

## 2018-02-01 DIAGNOSIS — E1122 Type 2 diabetes mellitus with diabetic chronic kidney disease: Secondary | ICD-10-CM | POA: Diagnosis not present

## 2018-02-01 DIAGNOSIS — I130 Hypertensive heart and chronic kidney disease with heart failure and stage 1 through stage 4 chronic kidney disease, or unspecified chronic kidney disease: Secondary | ICD-10-CM | POA: Diagnosis not present

## 2018-02-01 DIAGNOSIS — I5032 Chronic diastolic (congestive) heart failure: Secondary | ICD-10-CM

## 2018-02-01 DIAGNOSIS — I2510 Atherosclerotic heart disease of native coronary artery without angina pectoris: Secondary | ICD-10-CM | POA: Diagnosis not present

## 2018-02-01 DIAGNOSIS — N183 Chronic kidney disease, stage 3 (moderate): Secondary | ICD-10-CM | POA: Diagnosis not present

## 2018-02-01 DIAGNOSIS — D649 Anemia, unspecified: Secondary | ICD-10-CM | POA: Diagnosis not present

## 2018-02-01 MED ORDER — FOSFOMYCIN TROMETHAMINE 3 G PO PACK: PACK | ORAL | 0 refills | Status: DC

## 2018-02-01 NOTE — Progress Notes (Signed)
Larey Dresser, MD  Scarlette Calico, RN Cc: Larey Dresser, MD        Needs complete PFTs when he is out of hospital. May be candidate for percutaneous mitral valve at [facility]    Order placed

## 2018-02-01 NOTE — Telephone Encounter (Signed)
Pt to be discharged from rehab. Spoke with Algis Liming who will be ordering fosfomycin X2 for pseudomonas in urine. Pt sent home on warfarin 7.5mg  daily except 5mg  on MWF. INR to be checked on Monday by home health and called to our clinic.

## 2018-02-01 NOTE — Telephone Encounter (Signed)
Positive urine culture noted past discharge. Discussed appropriate antibiotic with Inpatient pharmacist as well as pharmacist at [facility] coumadin clinic. Decided on Monurol as it will not affect INR. Monurol 3 gram today and repeat dose in 3 days (UTI in male). Patient reports nocturia--gets up every hour at night premorbidly. Recommended GU follow up for BPH and also informed of above.

## 2018-02-04 ENCOUNTER — Ambulatory Visit (INDEPENDENT_AMBULATORY_CARE_PROVIDER_SITE_OTHER): Payer: Medicare Other | Admitting: Pharmacist

## 2018-02-04 ENCOUNTER — Encounter: Payer: Medicare Other | Admitting: Thoracic Surgery (Cardiothoracic Vascular Surgery)

## 2018-02-04 DIAGNOSIS — D649 Anemia, unspecified: Secondary | ICD-10-CM | POA: Diagnosis not present

## 2018-02-04 DIAGNOSIS — I4811 Longstanding persistent atrial fibrillation: Secondary | ICD-10-CM

## 2018-02-04 DIAGNOSIS — Z7189 Other specified counseling: Secondary | ICD-10-CM

## 2018-02-04 DIAGNOSIS — I5033 Acute on chronic diastolic (congestive) heart failure: Secondary | ICD-10-CM | POA: Diagnosis not present

## 2018-02-04 DIAGNOSIS — Z952 Presence of prosthetic heart valve: Secondary | ICD-10-CM | POA: Diagnosis not present

## 2018-02-04 DIAGNOSIS — E1122 Type 2 diabetes mellitus with diabetic chronic kidney disease: Secondary | ICD-10-CM | POA: Diagnosis not present

## 2018-02-04 DIAGNOSIS — I2510 Atherosclerotic heart disease of native coronary artery without angina pectoris: Secondary | ICD-10-CM | POA: Diagnosis not present

## 2018-02-04 DIAGNOSIS — I130 Hypertensive heart and chronic kidney disease with heart failure and stage 1 through stage 4 chronic kidney disease, or unspecified chronic kidney disease: Secondary | ICD-10-CM | POA: Diagnosis not present

## 2018-02-04 DIAGNOSIS — N183 Chronic kidney disease, stage 3 (moderate): Secondary | ICD-10-CM | POA: Diagnosis not present

## 2018-02-04 LAB — POCT INR: INR: 3.7 — AB (ref 2.0–3.0)

## 2018-02-06 DIAGNOSIS — E1122 Type 2 diabetes mellitus with diabetic chronic kidney disease: Secondary | ICD-10-CM | POA: Diagnosis not present

## 2018-02-06 DIAGNOSIS — I2510 Atherosclerotic heart disease of native coronary artery without angina pectoris: Secondary | ICD-10-CM | POA: Diagnosis not present

## 2018-02-06 DIAGNOSIS — N183 Chronic kidney disease, stage 3 (moderate): Secondary | ICD-10-CM | POA: Diagnosis not present

## 2018-02-06 DIAGNOSIS — I5033 Acute on chronic diastolic (congestive) heart failure: Secondary | ICD-10-CM | POA: Diagnosis not present

## 2018-02-06 DIAGNOSIS — D649 Anemia, unspecified: Secondary | ICD-10-CM | POA: Diagnosis not present

## 2018-02-06 DIAGNOSIS — I130 Hypertensive heart and chronic kidney disease with heart failure and stage 1 through stage 4 chronic kidney disease, or unspecified chronic kidney disease: Secondary | ICD-10-CM | POA: Diagnosis not present

## 2018-02-07 DIAGNOSIS — I5033 Acute on chronic diastolic (congestive) heart failure: Secondary | ICD-10-CM | POA: Diagnosis not present

## 2018-02-07 DIAGNOSIS — I130 Hypertensive heart and chronic kidney disease with heart failure and stage 1 through stage 4 chronic kidney disease, or unspecified chronic kidney disease: Secondary | ICD-10-CM | POA: Diagnosis not present

## 2018-02-07 DIAGNOSIS — I2510 Atherosclerotic heart disease of native coronary artery without angina pectoris: Secondary | ICD-10-CM | POA: Diagnosis not present

## 2018-02-07 DIAGNOSIS — N183 Chronic kidney disease, stage 3 (moderate): Secondary | ICD-10-CM | POA: Diagnosis not present

## 2018-02-07 DIAGNOSIS — E1122 Type 2 diabetes mellitus with diabetic chronic kidney disease: Secondary | ICD-10-CM | POA: Diagnosis not present

## 2018-02-07 DIAGNOSIS — D649 Anemia, unspecified: Secondary | ICD-10-CM | POA: Diagnosis not present

## 2018-02-08 DIAGNOSIS — I130 Hypertensive heart and chronic kidney disease with heart failure and stage 1 through stage 4 chronic kidney disease, or unspecified chronic kidney disease: Secondary | ICD-10-CM | POA: Diagnosis not present

## 2018-02-08 DIAGNOSIS — I5033 Acute on chronic diastolic (congestive) heart failure: Secondary | ICD-10-CM | POA: Diagnosis not present

## 2018-02-08 DIAGNOSIS — I2510 Atherosclerotic heart disease of native coronary artery without angina pectoris: Secondary | ICD-10-CM | POA: Diagnosis not present

## 2018-02-08 DIAGNOSIS — N183 Chronic kidney disease, stage 3 (moderate): Secondary | ICD-10-CM | POA: Diagnosis not present

## 2018-02-08 DIAGNOSIS — E1122 Type 2 diabetes mellitus with diabetic chronic kidney disease: Secondary | ICD-10-CM | POA: Diagnosis not present

## 2018-02-08 DIAGNOSIS — D649 Anemia, unspecified: Secondary | ICD-10-CM | POA: Diagnosis not present

## 2018-02-11 ENCOUNTER — Ambulatory Visit (INDEPENDENT_AMBULATORY_CARE_PROVIDER_SITE_OTHER): Payer: Medicare Other | Admitting: Cardiovascular Disease

## 2018-02-11 DIAGNOSIS — D649 Anemia, unspecified: Secondary | ICD-10-CM | POA: Diagnosis not present

## 2018-02-11 DIAGNOSIS — I4811 Longstanding persistent atrial fibrillation: Secondary | ICD-10-CM

## 2018-02-11 DIAGNOSIS — Z952 Presence of prosthetic heart valve: Secondary | ICD-10-CM

## 2018-02-11 DIAGNOSIS — I2510 Atherosclerotic heart disease of native coronary artery without angina pectoris: Secondary | ICD-10-CM | POA: Diagnosis not present

## 2018-02-11 DIAGNOSIS — I5033 Acute on chronic diastolic (congestive) heart failure: Secondary | ICD-10-CM | POA: Diagnosis not present

## 2018-02-11 DIAGNOSIS — I130 Hypertensive heart and chronic kidney disease with heart failure and stage 1 through stage 4 chronic kidney disease, or unspecified chronic kidney disease: Secondary | ICD-10-CM | POA: Diagnosis not present

## 2018-02-11 DIAGNOSIS — E1122 Type 2 diabetes mellitus with diabetic chronic kidney disease: Secondary | ICD-10-CM | POA: Diagnosis not present

## 2018-02-11 DIAGNOSIS — Z5181 Encounter for therapeutic drug level monitoring: Secondary | ICD-10-CM | POA: Diagnosis not present

## 2018-02-11 DIAGNOSIS — Z7189 Other specified counseling: Secondary | ICD-10-CM

## 2018-02-11 DIAGNOSIS — N183 Chronic kidney disease, stage 3 (moderate): Secondary | ICD-10-CM | POA: Diagnosis not present

## 2018-02-11 DIAGNOSIS — I482 Chronic atrial fibrillation, unspecified: Secondary | ICD-10-CM | POA: Diagnosis not present

## 2018-02-11 LAB — POCT INR: INR: 4 — AB (ref 2.0–3.0)

## 2018-02-11 NOTE — Patient Instructions (Signed)
Description   Spoke with Karena Addison [facility] LPN)while in the home with patient and advised to skip today's dose then start taking 7.5mg  on Monday and Friday, 5mg  all other days. Recheck INR on Monday. Call with any new medications or procedures [phone].

## 2018-02-12 ENCOUNTER — Ambulatory Visit (HOSPITAL_COMMUNITY)
Admission: RE | Admit: 2018-02-12 | Discharge: 2018-02-12 | Disposition: A | Discharge status: Home / Self Care | Payer: Medicare Other | Source: Ambulatory Visit | Attending: Cardiology | Admitting: Cardiology

## 2018-02-12 DIAGNOSIS — I5032 Chronic diastolic (congestive) heart failure: Secondary | ICD-10-CM | POA: Diagnosis not present

## 2018-02-12 LAB — PULMONARY FUNCTION TEST
DL/VA % PRED: 34 %
DL/VA: 1.67 ml/min/mmHg/L
DLCO UNC: 9.87 ml/min/mmHg
DLCO unc % pred: 26 %
FEF 25-75 POST: 0.98 L/s
FEF 25-75 Pre: 0.82 L/sec
FEF2575-%Change-Post: 19 %
FEF2575-%PRED-PRE: 34 %
FEF2575-%Pred-Post: 41 %
FEV1-%Change-Post: 8 %
FEV1-%PRED-PRE: 62 %
FEV1-%Pred-Post: 67 %
FEV1-Post: 2.3 L
FEV1-Pre: 2.12 L
FEV1FVC-%CHANGE-POST: 15 %
FEV1FVC-%Pred-Pre: 82 %
FEV6-%CHANGE-POST: 0 %
FEV6-%Pred-Post: 74 %
FEV6-%Pred-Pre: 73 %
FEV6-PRE: 3.25 L
FEV6-Post: 3.29 L
FEV6FVC-%Change-Post: 6 %
FEV6FVC-%PRED-PRE: 97 %
FEV6FVC-%Pred-Post: 103 %
FVC-%Change-Post: -5 %
FVC-%PRED-POST: 71 %
FVC-%PRED-PRE: 76 %
FVC-POST: 3.39 L
FVC-PRE: 3.61 L
POST FEV1/FVC RATIO: 68 %
POST FEV6/FVC RATIO: 97 %
PRE FEV1/FVC RATIO: 59 %
Pre FEV6/FVC Ratio: 92 %

## 2018-02-12 MED ORDER — ALBUTEROL SULFATE (2.5 MG/3ML) 0.083% IN NEBU
2.5000 mg | INHALATION_SOLUTION | Freq: Once | RESPIRATORY_TRACT | Status: AC
  Administered 2018-02-12: 2.5 mg via RESPIRATORY_TRACT

## 2018-02-13 DIAGNOSIS — E1122 Type 2 diabetes mellitus with diabetic chronic kidney disease: Secondary | ICD-10-CM | POA: Diagnosis not present

## 2018-02-13 DIAGNOSIS — N183 Chronic kidney disease, stage 3 (moderate): Secondary | ICD-10-CM | POA: Diagnosis not present

## 2018-02-13 DIAGNOSIS — D649 Anemia, unspecified: Secondary | ICD-10-CM | POA: Diagnosis not present

## 2018-02-13 DIAGNOSIS — I130 Hypertensive heart and chronic kidney disease with heart failure and stage 1 through stage 4 chronic kidney disease, or unspecified chronic kidney disease: Secondary | ICD-10-CM | POA: Diagnosis not present

## 2018-02-13 DIAGNOSIS — I5033 Acute on chronic diastolic (congestive) heart failure: Secondary | ICD-10-CM | POA: Diagnosis not present

## 2018-02-13 DIAGNOSIS — I2510 Atherosclerotic heart disease of native coronary artery without angina pectoris: Secondary | ICD-10-CM | POA: Diagnosis not present

## 2018-02-13 MED ORDER — POTASSIUM CHLORIDE CRYS ER 20 MEQ PO TBCR: 40.0000 meq | EXTENDED_RELEASE_TABLET | Freq: Three times a day (TID) | ORAL | 0 refills | Status: DC

## 2018-02-13 NOTE — Telephone Encounter (Signed)
Called and notified pt of his lab results, per Amy ,NP  Pt is to take K 58meq TID , pt will have his labs rechecked on Tues 02/19/18 at his appt. Pt informed of of this.

## 2018-02-14 ENCOUNTER — Telehealth (HOSPITAL_COMMUNITY): Payer: Self-pay

## 2018-02-14 DIAGNOSIS — I130 Hypertensive heart and chronic kidney disease with heart failure and stage 1 through stage 4 chronic kidney disease, or unspecified chronic kidney disease: Secondary | ICD-10-CM | POA: Diagnosis not present

## 2018-02-14 DIAGNOSIS — N183 Chronic kidney disease, stage 3 (moderate): Secondary | ICD-10-CM | POA: Diagnosis not present

## 2018-02-14 DIAGNOSIS — D649 Anemia, unspecified: Secondary | ICD-10-CM | POA: Diagnosis not present

## 2018-02-14 DIAGNOSIS — I2510 Atherosclerotic heart disease of native coronary artery without angina pectoris: Secondary | ICD-10-CM | POA: Diagnosis not present

## 2018-02-14 DIAGNOSIS — I5033 Acute on chronic diastolic (congestive) heart failure: Secondary | ICD-10-CM | POA: Diagnosis not present

## 2018-02-14 DIAGNOSIS — E1122 Type 2 diabetes mellitus with diabetic chronic kidney disease: Secondary | ICD-10-CM | POA: Diagnosis not present

## 2018-02-14 NOTE — Telephone Encounter (Signed)
Pt called given results, results sent to Dr. Bridgette Habermann at [facility] medical center

## 2018-02-15 DIAGNOSIS — D649 Anemia, unspecified: Secondary | ICD-10-CM | POA: Diagnosis not present

## 2018-02-15 DIAGNOSIS — I2510 Atherosclerotic heart disease of native coronary artery without angina pectoris: Secondary | ICD-10-CM | POA: Diagnosis not present

## 2018-02-15 DIAGNOSIS — E1122 Type 2 diabetes mellitus with diabetic chronic kidney disease: Secondary | ICD-10-CM | POA: Diagnosis not present

## 2018-02-15 DIAGNOSIS — N183 Chronic kidney disease, stage 3 (moderate): Secondary | ICD-10-CM | POA: Diagnosis not present

## 2018-02-15 DIAGNOSIS — I5033 Acute on chronic diastolic (congestive) heart failure: Secondary | ICD-10-CM | POA: Diagnosis not present

## 2018-02-15 DIAGNOSIS — I130 Hypertensive heart and chronic kidney disease with heart failure and stage 1 through stage 4 chronic kidney disease, or unspecified chronic kidney disease: Secondary | ICD-10-CM | POA: Diagnosis not present

## 2018-02-18 ENCOUNTER — Other Ambulatory Visit: Payer: Self-pay

## 2018-02-18 ENCOUNTER — Ambulatory Visit (INDEPENDENT_AMBULATORY_CARE_PROVIDER_SITE_OTHER): Payer: Medicare Other

## 2018-02-18 ENCOUNTER — Other Ambulatory Visit (HOSPITAL_COMMUNITY): Payer: Self-pay | Admitting: Cardiology

## 2018-02-18 ENCOUNTER — Ambulatory Visit: Payer: Self-pay | Admitting: Interventional Cardiology

## 2018-02-18 DIAGNOSIS — I4811 Longstanding persistent atrial fibrillation: Secondary | ICD-10-CM | POA: Diagnosis not present

## 2018-02-18 DIAGNOSIS — I2510 Atherosclerotic heart disease of native coronary artery without angina pectoris: Secondary | ICD-10-CM | POA: Diagnosis not present

## 2018-02-18 DIAGNOSIS — I130 Hypertensive heart and chronic kidney disease with heart failure and stage 1 through stage 4 chronic kidney disease, or unspecified chronic kidney disease: Secondary | ICD-10-CM | POA: Diagnosis not present

## 2018-02-18 DIAGNOSIS — Z7189 Other specified counseling: Secondary | ICD-10-CM

## 2018-02-18 DIAGNOSIS — I5033 Acute on chronic diastolic (congestive) heart failure: Secondary | ICD-10-CM | POA: Diagnosis not present

## 2018-02-18 DIAGNOSIS — Z952 Presence of prosthetic heart valve: Secondary | ICD-10-CM

## 2018-02-18 DIAGNOSIS — D649 Anemia, unspecified: Secondary | ICD-10-CM | POA: Diagnosis not present

## 2018-02-18 DIAGNOSIS — E1122 Type 2 diabetes mellitus with diabetic chronic kidney disease: Secondary | ICD-10-CM | POA: Diagnosis not present

## 2018-02-18 DIAGNOSIS — N183 Chronic kidney disease, stage 3 (moderate): Secondary | ICD-10-CM | POA: Diagnosis not present

## 2018-02-18 LAB — POCT INR: INR: 3.7 — AB (ref 2.0–3.0)

## 2018-02-18 NOTE — Patient Instructions (Signed)
Description   Spoke with Karena Addison [facility] LPN)while in the home with patient and advised to skip today's dosage of Coumadin, then start taking 5mg  daily except 7.5mg  on Mondays. Recheck INR in 1 week. Call with any new medications or procedures [phone].

## 2018-02-19 ENCOUNTER — Ambulatory Visit (HOSPITAL_COMMUNITY)
Admission: RE | Admit: 2018-02-19 | Discharge: 2018-02-19 | Disposition: A | Discharge status: Home / Self Care | Payer: Medicare Other | Source: Ambulatory Visit | Attending: Cardiology | Admitting: Cardiology

## 2018-02-19 ENCOUNTER — Ambulatory Visit: Payer: Self-pay | Admitting: Internal Medicine

## 2018-02-19 ENCOUNTER — Encounter (HOSPITAL_COMMUNITY): Payer: Self-pay | Admitting: Cardiology

## 2018-02-19 VITALS — BP 107/56 | HR 74 | Wt 211.4 lb

## 2018-02-19 DIAGNOSIS — Z952 Presence of prosthetic heart valve: Secondary | ICD-10-CM | POA: Insufficient documentation

## 2018-02-19 DIAGNOSIS — Z79899 Other long term (current) drug therapy: Secondary | ICD-10-CM | POA: Insufficient documentation

## 2018-02-19 DIAGNOSIS — D696 Thrombocytopenia, unspecified: Secondary | ICD-10-CM | POA: Insufficient documentation

## 2018-02-19 DIAGNOSIS — Z7901 Long term (current) use of anticoagulants: Secondary | ICD-10-CM | POA: Diagnosis not present

## 2018-02-19 DIAGNOSIS — Z951 Presence of aortocoronary bypass graft: Secondary | ICD-10-CM | POA: Insufficient documentation

## 2018-02-19 DIAGNOSIS — N183 Chronic kidney disease, stage 3 (moderate): Secondary | ICD-10-CM | POA: Diagnosis not present

## 2018-02-19 DIAGNOSIS — I2510 Atherosclerotic heart disease of native coronary artery without angina pectoris: Secondary | ICD-10-CM | POA: Insufficient documentation

## 2018-02-19 DIAGNOSIS — I5032 Chronic diastolic (congestive) heart failure: Secondary | ICD-10-CM | POA: Insufficient documentation

## 2018-02-19 DIAGNOSIS — G4733 Obstructive sleep apnea (adult) (pediatric): Secondary | ICD-10-CM | POA: Insufficient documentation

## 2018-02-19 DIAGNOSIS — J449 Chronic obstructive pulmonary disease, unspecified: Secondary | ICD-10-CM | POA: Diagnosis not present

## 2018-02-19 DIAGNOSIS — I482 Chronic atrial fibrillation, unspecified: Secondary | ICD-10-CM | POA: Diagnosis not present

## 2018-02-19 DIAGNOSIS — Z87891 Personal history of nicotine dependence: Secondary | ICD-10-CM | POA: Diagnosis not present

## 2018-02-19 DIAGNOSIS — E1122 Type 2 diabetes mellitus with diabetic chronic kidney disease: Secondary | ICD-10-CM | POA: Diagnosis not present

## 2018-02-19 DIAGNOSIS — I340 Nonrheumatic mitral (valve) insufficiency: Secondary | ICD-10-CM | POA: Insufficient documentation

## 2018-02-19 DIAGNOSIS — Z881 Allergy status to other antibiotic agents status: Secondary | ICD-10-CM | POA: Diagnosis not present

## 2018-02-19 LAB — TSH: TSH: 8.392 u[IU]/mL — ABNORMAL HIGH (ref 0.350–4.500)

## 2018-02-19 LAB — COMPREHENSIVE METABOLIC PANEL
ALT: 23 U/L (ref 0–44)
AST: 38 U/L (ref 15–41)
Albumin: 3.7 g/dL (ref 3.5–5.0)
Alkaline Phosphatase: 146 U/L — ABNORMAL HIGH (ref 38–126)
Anion gap: 13 (ref 5–15)
BUN: 52 mg/dL — AB (ref 8–23)
CHLORIDE: 94 mmol/L — AB (ref 98–111)
CO2: 30 mmol/L (ref 22–32)
CREATININE: 1.33 mg/dL — AB (ref 0.61–1.24)
Calcium: 9.7 mg/dL (ref 8.9–10.3)
GFR calc Af Amer: 57 mL/min — ABNORMAL LOW (ref >60)
GFR calc non Af Amer: 49 mL/min — ABNORMAL LOW (ref >60)
GLUCOSE: 118 mg/dL — AB (ref 70–99)
Potassium: 3.3 mmol/L — ABNORMAL LOW (ref 3.5–5.1)
SODIUM: 137 mmol/L (ref 135–145)
Total Bilirubin: 1.4 mg/dL — ABNORMAL HIGH (ref 0.3–1.2)
Total Protein: 7.6 g/dL (ref 6.5–8.1)

## 2018-02-19 LAB — LIPID PANEL
CHOL/HDL RATIO: 2.6 ratio
Cholesterol: 137 mg/dL (ref 0–200)
HDL: 52 mg/dL (ref >40)
LDL Cholesterol: 70 mg/dL (ref 0–99)
Triglycerides: 73 mg/dL (ref <150)
VLDL: 15 mg/dL (ref 0–40)

## 2018-02-19 NOTE — Patient Instructions (Signed)
.  Labs today We will only contact you if something comes back abnormal or we need to make some changes. Otherwise no news is good news!  Your physician recommends that you schedule a follow-up appointment in: 1 month in NP/PA clinic and 3 months with Dr. [name].

## 2018-02-20 DIAGNOSIS — I130 Hypertensive heart and chronic kidney disease with heart failure and stage 1 through stage 4 chronic kidney disease, or unspecified chronic kidney disease: Secondary | ICD-10-CM | POA: Diagnosis not present

## 2018-02-20 DIAGNOSIS — D649 Anemia, unspecified: Secondary | ICD-10-CM | POA: Diagnosis not present

## 2018-02-20 DIAGNOSIS — I5033 Acute on chronic diastolic (congestive) heart failure: Secondary | ICD-10-CM | POA: Diagnosis not present

## 2018-02-20 DIAGNOSIS — E1122 Type 2 diabetes mellitus with diabetic chronic kidney disease: Secondary | ICD-10-CM | POA: Diagnosis not present

## 2018-02-20 DIAGNOSIS — N183 Chronic kidney disease, stage 3 (moderate): Secondary | ICD-10-CM | POA: Diagnosis not present

## 2018-02-20 DIAGNOSIS — I2510 Atherosclerotic heart disease of native coronary artery without angina pectoris: Secondary | ICD-10-CM | POA: Diagnosis not present

## 2018-02-20 NOTE — Progress Notes (Signed)
Advanced Heart Failure Clinic Note   PCP: [name], [name], MD PCP-Cardiologist: [name], MD  HF: [name]  HPI: [name] is a 82 y.o. male with h/o mechanical AVR 1994, OSA on CPAP, Chronic afib, chronic combined CHF, CKD III, chronic coumadin therapy, CAD s/p CAGB 1994, and COPD.   Admitted 7/18-11/05/17 from [facility] office with A/C diastolic HF. Diuresed 83 lbs with lasix drip, metolazone, and diamox. Transitioned to torsemide 80 mg BID. Determined not to be a candidate for MitraClip. Dr [name] also consulted and recommended aggressive medical therapy versus  percutaneous MVR trial as he felt very high risk for conventional surgery. Course complicated by drop in hemoglobin and symptomatic hypotension, requiring 1 unit pRBCs. GI consulted and did not recommend scoping. BB and spiro were stopped due to low BPs. Had some AKI assoicated with low BP, but resolved by discharge. Required lovenox bridge at DC with subtherapeutic INR. Discharged with [facility]. DC weight: 216 lbs.   Admitted 9/11 - 12/14/17 for dental extractions in setting of work up for MVR. Hospital course complicated by run of VT in the setting of hypokalemia. Started on amiodarone with h/o of arrhythmia. Bridged with lovenox.   Admitted 9/22 - 12/28/17 with symptomatic anemia from bleeding gums. Bleeding stopped with holding heparin and coumadin. Received multiple units of blood.   He was admitted again in 10/19 with left thigh hematoma and went from there to inpatient rehab.   He returns for followup of diastolic CHF, mechanical aortic valve, chronic atrial fibrillation, and CAD.  He is doing fairly well now. No dyspnea walking on flat ground. Using a rollator when he leaves the house.  Weight has been stable.  Using CPAP at night.  No chest pain.  Feels like fluid is staying off him on current diuretic regimen.   Labs (10/19): hgb 9.2 Labs (11/19): K 3.2, creatinine 1.31  ROS:  All systems reviewed and negative  except as per HPI.    PMH: 1. Carotid stenosis: S/p left CEA in 1994.  2. Atrial fibrillation: Chronic.  He is on warfarin with mechanical aortic valve.  3. Type II diabetes.  4. COPD: PFTs (11/19) with moderate obstructive airways disease and severely decreased DLCO.  5. OSA: Uses CPAP.  6. 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  7. ITP 8. VT: On amiodarone.  9. CKD: Stage 3.  10. 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  11. Chronic diastolic CHF: TEE (2/40) with EF 55-60%, D-shaped septum, mildly dilated RV with mildly decreased systolic function, mechanical aortic valve, rheumatic-appearing mitral valve with severe MR, minimal stenosis.  12. ABIs (12/18): Normal.  13. CAD: S/p CABG 1994.    Current Outpatient Medications  Medication Sig Dispense Refill  . acetaminophen (TYLENOL) 325 MG tablet Take 1-2 tablets (325-650 mg total) by mouth every 4 (four) hours as needed for mild pain.    Marland Kitchen allopurinol (ZYLOPRIM) 100 MG tablet Take 1 tablet (100 mg total) by mouth daily. 30 tablet 3  . amiodarone (PACERONE) 200 MG tablet Take 1 tablet (200 mg total) by mouth daily. 30 tablet 0  . ezetimibe-simvastatin (VYTORIN) 10-20 MG tablet Take 1 tablet by mouth at bedtime. 90 tablet 3  . metolazone (ZAROXOLYN) 2.5 MG tablet Take 1 tablet (2.5 mg total) by mouth every Monday. 10 tablet 0  . potassium chloride SA (K-DUR,KLOR-CON) 20 MEQ tablet Take 2 tablets (40 mEq total)  by mouth 3 (three) times daily. 120 tablet 0  . senna-docusate (SENOKOT-S) 8.6-50 MG tablet Take 1 tablet by mouth 2 (two) times daily. 60 tablet 0  . torsemide (DEMADEX) 100 MG tablet Take 1 tablet (100 mg total) by mouth 2 (two) times daily. 60 tablet 0  . warfarin (COUMADIN) 5 MG tablet One pill daily on Tue, Thurs, Sat, Sun 30 tablet 0  . warfarin (COUMADIN) 7.5 MG tablet On Monday, Wednesday and Friday with supper 30  tablet 0   No current facility-administered medications for this encounter.    Allergies  Allergen Reactions  . Rifampin Rash    May have been caused by Vancomycin or Rifampin (??)  . Vancomycin Rash    May have been caused by Vancomycin or Rifampin (??)   Social History   Socioeconomic History  . Marital status: Married    Spouse name: Not on file  . Number of children: 3  . Years of education: Not on file  . Highest education level: Not on file  Occupational History  . Not on file  Social Needs  . Financial resource strain: Not on file  . Food insecurity:    Worry: Not on file    Inability: Not on file  . Transportation needs:    Medical: Not on file    Non-medical: Not on file  Tobacco Use  . Smoking status: Former Smoker    Packs/day: 1.00    Years: 30.00    Pack years: 30.00    Types: Cigarettes    Last attempt to quit: 04/03/1992    Years since quitting: 25.9  . Smokeless tobacco: Never Used  Substance and Sexual Activity  . Alcohol use: Yes    Comment: 4-6 ounces of wine daily  . Drug use: No    Comment: Half a cup a day.  Marland Kitchen Sexual activity: Not on file  Lifestyle  . Physical activity:    Days per week: Not on file    Minutes per session: Not on file  . Stress: Not on file  Relationships  . Social connections:    Talks on phone: Not on file    Gets together: Not on file    Attends religious service: Not on file    Active member of club or organization: Not on file    Attends meetings of clubs or organizations: Not on file    Relationship status: Not on file  . Intimate partner violence:    Fear of current or ex partner: No    Emotionally abused: No    Physically abused: No    Forced sexual activity: No  Other Topics Concern  . Not on file  Social History Narrative   Patient is married and lives with his wife.   Youngest daughter lives in [place] also.   Patient has 2 other children.    Family History  Problem Relation Age of Onset  .  Cancer Father        lung   . Hypertension Mother    Vitals:   02/19/18 1435  BP: (!) 107/56  Pulse: 74  SpO2: 97%  Weight: 95.9 kg (211 lb 6.4 oz)     Wt Readings from Last 3 Encounters:  02/19/18 95.9 kg (211 lb 6.4 oz)  01/31/18 94 kg (207 lb 3.7 oz)  01/12/18 99 kg (218 lb 4.1 oz)   PHYSICAL EXAM: General: NAD Neck: No JVD, no thyromegaly or thyroid nodule.  Lungs: Clear to auscultation bilaterally with normal  respiratory effort. CV: Nondisplaced PMI.  Heart irregular S1/S2, no S3/S4, 3/6 HSM apex.  1+ edema 1/3 up lower legs bilaterally.  No carotid bruit.  Normal pedal pulses.  Abdomen: Soft, nontender, no hepatosplenomegaly, no distention.  Skin: Intact without lesions or rashes.  Neurologic: Alert and oriented x 3.  Psych: Normal affect. Extremities: No clubbing or cyanosis.  HEENT: Normal.   ASSESSMENT & PLAN: 1. Chronic diastolic CHF with prominent RV failure:  Echo 10/2017 with EF 55-60%, mechanical aortic valve functioning normally, mild mitral stenosis/severe mitral regurgitation, RV moderately dilated with moderately decreased systolic function, PASP 49 mmHg.  Severe MR likely plays a significant role in CHF and RV failure. NYHA class III.  He is gradually improving.  Weight is stable, he is down considerably from peak in setting of CHF exacerbation. On exam, he is not significantly volume overloaded.     - Continue torsemide 100 mg bid with metolazone once a week on Mondays. He is on KCl 40 tid. BMET today.    - Eventually would likely benefit from restarting spironolactone.  2. Mechanical aortic valve: Appeared to function well on last echo.  - Continue warfarin with INR goal 2.5-3.5 (also with atrial fibrillation).  - He is off aspirin with recent thigh hematoma.   3. Atrial fibrillation: Chronic, rate is controlled.  4. CAD: s/p CABG.  No chest pain.  - No ASA given stable CAD with warfarin use.   - Continue Vytorin, check lipids today.  5. COPD: He is no  longer using oxygen during the day.  Moderate COPD on 11/19 PFTs.   6. OSA: Continue nightly BiPAP.  7.CKD Stage 3: BMET today.  8. Thrombocytopenia: Chronic, has been mild/stable.   9. Severe MR: TEE 7/19 with severe MR with possible rheumatic MV (do not think MS is significant, mildly elevated mean gradient from high flow with severe MR).  Structural Heart team evaluated, not a Mitraclip candidate. Seen by Dr. [name], would be high risk for open surgery.   - I am looking into percutaneous MV options for him at [facility].   10. VT: He is on amiodarone to suppress VT.  - Check LFTs, TSH today.  Will need regular eye exam on amiodaeone.   Followup with APP in 1 month.   Loralie Champagne 02/20/2018

## 2018-02-21 ENCOUNTER — Encounter: Payer: Self-pay | Admitting: Thoracic Surgery (Cardiothoracic Vascular Surgery)

## 2018-02-21 ENCOUNTER — Institutional Professional Consult (permissible substitution) (INDEPENDENT_AMBULATORY_CARE_PROVIDER_SITE_OTHER): Payer: Medicare Other | Admitting: Thoracic Surgery (Cardiothoracic Vascular Surgery)

## 2018-02-21 VITALS — BP 117/65 | HR 80 | Resp 20 | Ht 74.0 in | Wt 207.0 lb

## 2018-02-21 DIAGNOSIS — I4891 Unspecified atrial fibrillation: Secondary | ICD-10-CM | POA: Diagnosis not present

## 2018-02-21 DIAGNOSIS — I25708 Atherosclerosis of coronary artery bypass graft(s), unspecified, with other forms of angina pectoris: Secondary | ICD-10-CM

## 2018-02-21 DIAGNOSIS — I340 Nonrheumatic mitral (valve) insufficiency: Secondary | ICD-10-CM | POA: Diagnosis not present

## 2018-02-21 DIAGNOSIS — Z952 Presence of prosthetic heart valve: Secondary | ICD-10-CM | POA: Diagnosis not present

## 2018-02-21 DIAGNOSIS — I2510 Atherosclerotic heart disease of native coronary artery without angina pectoris: Secondary | ICD-10-CM | POA: Diagnosis not present

## 2018-02-21 DIAGNOSIS — E1122 Type 2 diabetes mellitus with diabetic chronic kidney disease: Secondary | ICD-10-CM | POA: Diagnosis not present

## 2018-02-21 DIAGNOSIS — N183 Chronic kidney disease, stage 3 (moderate): Secondary | ICD-10-CM | POA: Diagnosis not present

## 2018-02-21 DIAGNOSIS — I5032 Chronic diastolic (congestive) heart failure: Secondary | ICD-10-CM | POA: Diagnosis not present

## 2018-02-21 DIAGNOSIS — D649 Anemia, unspecified: Secondary | ICD-10-CM | POA: Diagnosis not present

## 2018-02-21 DIAGNOSIS — I5033 Acute on chronic diastolic (congestive) heart failure: Secondary | ICD-10-CM | POA: Diagnosis not present

## 2018-02-21 DIAGNOSIS — I130 Hypertensive heart and chronic kidney disease with heart failure and stage 1 through stage 4 chronic kidney disease, or unspecified chronic kidney disease: Secondary | ICD-10-CM | POA: Diagnosis not present

## 2018-02-21 NOTE — Patient Instructions (Signed)
Continue all previous medications without any changes at this time  

## 2018-02-21 NOTE — Progress Notes (Signed)
[address]       [place] [address]             [phone]     CARDIOTHORACIC SURGERY OFFICE NOTE  Referring Provider is Larey Dresser, MD  Primary Cardiologist is Belva Crome, MD PCP is [name], [name], MD   HPI:  Patient is an 82 year old male with history of long-standing rheumatic heart disease status post aortic valve replacement with a bileaflet mechanical valve and coronary artery bypass grafting x2 in 5573, chronic diastolic congestive heart failure, long-standing persistent atrial fibrillation on warfarin anticoagulation, stage III chronic kidney disease, obstructive sleep apnea on CPAP, type 2 diabetes mellitus, and cerebrovascular disease with previous carotid endarterectomy who returns to the office today to discuss treatment options for management of severe symptomatic primary mitral regurgitation.  I previously had the opportunity to evaluate the patient in consultation during his hospitalization last summer for acute exacerbation of chronic diastolic congestive heart failure.  Full consultation note was documented on October 26, 2017.  The patient's diagnostic work-up included transesophageal echocardiogram which confirmed the presence of severe mitral regurgitation with likely a combination of type I and type IIIa mitral valve dysfunction in the setting of underlying rheumatic heart disease.  There was mild calcification of the mitral valve with restricted leaflet mobility, particularly involving the posterior leaflet.  There was very mild mitral stenosis.  Left ventricular systolic function was reasonably well preserved.  There was at least moderate right ventricular dysfunction but only mild tricuspid regurgitation.  I felt the patient would likely benefit from mitral valve replacement but surgical risks were prohibitive because of the patient's advanced age, previous cardiac surgery, severe right ventricular dysfunction, decompensated right heart  failure, and numerous other comorbid conditions.  The patient's TEE was also reviewed by a multidisciplinary team of specialists and anatomical characteristics were felt to be unfavorable for percutaneous edge to edge MitraClip.  Since then the patient has been followed carefully by Dr. [name] and colleagues in the advanced heart failure clinic.  He was hospitalized in September for dental extraction which was complicated by a run of ventricular tachycardia that occurred in the setting of hypokalemia.  He was started on amiodarone.  He was discharged from the hospital but again readmitted because of prolonged bleeding from his mouth which caused severe acute blood loss anemia and acute exacerbation of chronic heart failure.  He slowly improved but was readmitted in October following a spontaneous bleed into his left thigh.  He was eventually discharged to the inpatient rehab service and more recently discharged home.  He was seen in follow-up earlier this week by Dr. [name] and returns to our office today for follow-up.  At present the patient reports that he is slowly getting a little bit stronger.  He is now ambulating using a walker because he remains weak, but he states that he is making some progress.  He gets short of breath with activity.  He becomes breathless with prolonged speech.  He denies resting shortness of breath or orthopnea.  He is not having any chest pain or chest tightness.     Current Outpatient Medications  Medication Sig Dispense Refill  . acetaminophen (TYLENOL) 325 MG tablet Take 1-2 tablets (325-650 mg total) by mouth every 4 (four) hours as needed for mild pain.    Marland Kitchen allopurinol (ZYLOPRIM) 100 MG tablet Take 1 tablet (100 mg total) by mouth daily. 30 tablet 3  . amiodarone (PACERONE) 200  MG tablet Take 1 tablet (200 mg total) by mouth daily. 30 tablet 0  . ezetimibe-simvastatin (VYTORIN) 10-20 MG tablet Take 1 tablet by mouth at bedtime. 90 tablet 3  . metolazone  (ZAROXOLYN) 2.5 MG tablet Take 1 tablet (2.5 mg total) by mouth every Monday. 10 tablet 0  . potassium chloride SA (K-DUR,KLOR-CON) 20 MEQ tablet Take 2 tablets (40 mEq total) by mouth 3 (three) times daily. 120 tablet 0  . senna-docusate (SENOKOT-S) 8.6-50 MG tablet Take 1 tablet by mouth 2 (two) times daily. 60 tablet 0  . torsemide (DEMADEX) 100 MG tablet Take 1 tablet (100 mg total) by mouth 2 (two) times daily. 60 tablet 0  . warfarin (COUMADIN) 5 MG tablet One pill daily on Tue, Thurs, Sat, Sun 30 tablet 0  . warfarin (COUMADIN) 7.5 MG tablet On Monday, Wednesday and Friday with supper 30 tablet 0   No current facility-administered medications for this visit.       Physical Exam:   BP 117/65   Pulse 80   Resp 20   Ht 6\' 2"  (1.88 m)   Wt 207 lb (93.9 kg)   SpO2 98% Comment: RA  BMI 26.58 kg/m   General:  Elderly and frail-appearing  Chest:   Diminished breath sounds right lung base with mild inspiratory crackles both lung bases  CV:   Irregular rate and rhythm with holosystolic murmur  Incisions:  n/a  Abdomen:  Soft nontender  Extremities:  Warm and well-perfused  Diagnostic Tests:  n/a   Impression:  Patient has stage D severe symptomatic primary mitral regurgitation.  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  He has long-standing persistent atrial fibrillation on warfarin anticoagulation.   He has been hospitalized several times over the past six months and appears weak and deconditioned.  Although his congestive heart failure appears slightly better than it did last summer, he remains very limited.  I agree the patient would likely benefit from mitral valve replacement.  However, risks associated with surgical intervention would be prohibitive because of the  patient's advanced age, previous cardiac surgery, right ventricular dysfunction, numerous other comorbid conditions, and severe physical deconditioning.  I would not consider this patient a candidate for conventional surgical mitral valve replacement via either redo median sternotomy or right minithoracotomy approach.    Plan:  I agree with plans for referral to a tertiary care center where the patient could be evaluated as to whether or not he might be candidate for transcatheter mitral valve replacement as part of an investigational trial.   I spent in excess of 15 minutes during the conduct of this office consultation and >50% of this time involved direct face-to-face encounter with the patient for counseling and/or coordination of their care.   Valentina Gu. [name], MD 02/21/2018 4:16 PM

## 2018-02-22 ENCOUNTER — Telehealth (HOSPITAL_COMMUNITY): Payer: Self-pay

## 2018-02-22 DIAGNOSIS — I130 Hypertensive heart and chronic kidney disease with heart failure and stage 1 through stage 4 chronic kidney disease, or unspecified chronic kidney disease: Secondary | ICD-10-CM | POA: Diagnosis not present

## 2018-02-22 DIAGNOSIS — E1122 Type 2 diabetes mellitus with diabetic chronic kidney disease: Secondary | ICD-10-CM | POA: Diagnosis not present

## 2018-02-22 DIAGNOSIS — I5033 Acute on chronic diastolic (congestive) heart failure: Secondary | ICD-10-CM | POA: Diagnosis not present

## 2018-02-22 DIAGNOSIS — I2510 Atherosclerotic heart disease of native coronary artery without angina pectoris: Secondary | ICD-10-CM | POA: Diagnosis not present

## 2018-02-22 DIAGNOSIS — D649 Anemia, unspecified: Secondary | ICD-10-CM | POA: Diagnosis not present

## 2018-02-22 DIAGNOSIS — N183 Chronic kidney disease, stage 3 (moderate): Secondary | ICD-10-CM | POA: Diagnosis not present

## 2018-02-22 NOTE — Telephone Encounter (Signed)
PFT results faxed to dr Bridgette Habermann

## 2018-02-22 NOTE — Telephone Encounter (Signed)
Dr. Gerlene Fee faxed labs

## 2018-02-22 NOTE — Telephone Encounter (Signed)
Pt called with no answer voice mail left for pt to call back  

## 2018-02-25 ENCOUNTER — Other Ambulatory Visit: Payer: Self-pay | Admitting: Endocrinology

## 2018-02-25 DIAGNOSIS — E119 Type 2 diabetes mellitus without complications: Secondary | ICD-10-CM

## 2018-02-25 DIAGNOSIS — I130 Hypertensive heart and chronic kidney disease with heart failure and stage 1 through stage 4 chronic kidney disease, or unspecified chronic kidney disease: Secondary | ICD-10-CM | POA: Diagnosis not present

## 2018-02-25 DIAGNOSIS — D649 Anemia, unspecified: Secondary | ICD-10-CM | POA: Diagnosis not present

## 2018-02-25 DIAGNOSIS — E1122 Type 2 diabetes mellitus with diabetic chronic kidney disease: Secondary | ICD-10-CM | POA: Diagnosis not present

## 2018-02-25 DIAGNOSIS — N183 Chronic kidney disease, stage 3 (moderate): Secondary | ICD-10-CM | POA: Diagnosis not present

## 2018-02-25 DIAGNOSIS — I5033 Acute on chronic diastolic (congestive) heart failure: Secondary | ICD-10-CM | POA: Diagnosis not present

## 2018-02-25 DIAGNOSIS — I2510 Atherosclerotic heart disease of native coronary artery without angina pectoris: Secondary | ICD-10-CM | POA: Diagnosis not present

## 2018-02-26 ENCOUNTER — Other Ambulatory Visit (INDEPENDENT_AMBULATORY_CARE_PROVIDER_SITE_OTHER): Payer: Medicare Other

## 2018-02-26 ENCOUNTER — Ambulatory Visit (INDEPENDENT_AMBULATORY_CARE_PROVIDER_SITE_OTHER): Payer: Medicare Other | Admitting: Interventional Cardiology

## 2018-02-26 DIAGNOSIS — I2510 Atherosclerotic heart disease of native coronary artery without angina pectoris: Secondary | ICD-10-CM | POA: Diagnosis not present

## 2018-02-26 DIAGNOSIS — D649 Anemia, unspecified: Secondary | ICD-10-CM | POA: Diagnosis not present

## 2018-02-26 DIAGNOSIS — Z952 Presence of prosthetic heart valve: Secondary | ICD-10-CM

## 2018-02-26 DIAGNOSIS — I4891 Unspecified atrial fibrillation: Secondary | ICD-10-CM | POA: Diagnosis not present

## 2018-02-26 DIAGNOSIS — Z7189 Other specified counseling: Secondary | ICD-10-CM

## 2018-02-26 DIAGNOSIS — I130 Hypertensive heart and chronic kidney disease with heart failure and stage 1 through stage 4 chronic kidney disease, or unspecified chronic kidney disease: Secondary | ICD-10-CM | POA: Diagnosis not present

## 2018-02-26 DIAGNOSIS — N183 Chronic kidney disease, stage 3 (moderate): Secondary | ICD-10-CM | POA: Diagnosis not present

## 2018-02-26 DIAGNOSIS — E1122 Type 2 diabetes mellitus with diabetic chronic kidney disease: Secondary | ICD-10-CM | POA: Diagnosis not present

## 2018-02-26 DIAGNOSIS — E119 Type 2 diabetes mellitus without complications: Secondary | ICD-10-CM

## 2018-02-26 DIAGNOSIS — I5033 Acute on chronic diastolic (congestive) heart failure: Secondary | ICD-10-CM | POA: Diagnosis not present

## 2018-02-26 LAB — BASIC METABOLIC PANEL
BUN: 61 mg/dL — AB (ref 6–23)
CALCIUM: 9.9 mg/dL (ref 8.4–10.5)
CO2: 30 mEq/L (ref 19–32)
Chloride: 97 mEq/L (ref 96–112)
Creatinine, Ser: 1.54 mg/dL — ABNORMAL HIGH (ref 0.40–1.50)
GFR: 46.33 mL/min — ABNORMAL LOW (ref >60.00)
GLUCOSE: 108 mg/dL — AB (ref 70–99)
Potassium: 3.5 mEq/L (ref 3.5–5.1)
Sodium: 138 mEq/L (ref 135–145)

## 2018-02-26 LAB — POCT INR: INR: 2.6 (ref 2.0–3.0)

## 2018-02-27 DIAGNOSIS — I130 Hypertensive heart and chronic kidney disease with heart failure and stage 1 through stage 4 chronic kidney disease, or unspecified chronic kidney disease: Secondary | ICD-10-CM | POA: Diagnosis not present

## 2018-02-27 DIAGNOSIS — D649 Anemia, unspecified: Secondary | ICD-10-CM | POA: Diagnosis not present

## 2018-02-27 DIAGNOSIS — E1122 Type 2 diabetes mellitus with diabetic chronic kidney disease: Secondary | ICD-10-CM | POA: Diagnosis not present

## 2018-02-27 DIAGNOSIS — I2510 Atherosclerotic heart disease of native coronary artery without angina pectoris: Secondary | ICD-10-CM | POA: Diagnosis not present

## 2018-02-27 DIAGNOSIS — I5033 Acute on chronic diastolic (congestive) heart failure: Secondary | ICD-10-CM | POA: Diagnosis not present

## 2018-02-27 DIAGNOSIS — N183 Chronic kidney disease, stage 3 (moderate): Secondary | ICD-10-CM | POA: Diagnosis not present

## 2018-02-27 LAB — FRUCTOSAMINE: Fructosamine: 272 umol/L (ref 0–285)

## 2018-03-01 ENCOUNTER — Ambulatory Visit: Payer: Medicare Other | Admitting: Endocrinology

## 2018-03-04 ENCOUNTER — Ambulatory Visit (INDEPENDENT_AMBULATORY_CARE_PROVIDER_SITE_OTHER): Payer: Medicare Other | Admitting: Endocrinology

## 2018-03-04 ENCOUNTER — Encounter: Payer: Self-pay | Admitting: Endocrinology

## 2018-03-04 VITALS — BP 118/60 | HR 87 | Ht 74.0 in | Wt 214.4 lb

## 2018-03-04 DIAGNOSIS — E119 Type 2 diabetes mellitus without complications: Secondary | ICD-10-CM

## 2018-03-04 DIAGNOSIS — I25708 Atherosclerosis of coronary artery bypass graft(s), unspecified, with other forms of angina pectoris: Secondary | ICD-10-CM | POA: Diagnosis not present

## 2018-03-04 NOTE — Progress Notes (Signed)
Patient ID: [name], male   DOB: [birth year], 82 y.o.   MRN: [id number]   Reason for Appointment: Diabetes follow-up   History of Present Illness   Diagnosis: Type 2 DIABETES MELITUS date of onset: 07/2010       Oral hypoglycemic drugs: metformin 1500 mg daily       Side effects from medications: None  He was started on metformin when his baseline A1c was 6.9% and he had significant obesity He was also sent for diabetes education. Since then A1c had been consistently upper normal  Recent history:  Oral hypoglycemic drugs: Metformin ER 750 mg, 2 daily  His A1c appearing falsely low at 4.7 September Fructosamine is upper normal at 272, no previous comparison available  Current management and blood sugars  He has had a considerable amount of weight loss this year  Also during his last hospitalization because of mildly abnormal renal function he was told to stop metformin she had been taking long-term safely  Has not had any change in appetite  Checking his blood sugars mostly around midday and has only one high reading of 192 otherwise they are usually just over 100 lab glucose 108  He is not able to do much physical activity because of various intercurrent problems His weight appears to be leveling off of in the last 3 to 4 weeks    Monitors blood glucose: Only sporadically  Blood sugars at noon range from 98 up to 198 with median 118   Diet: Breakfast: Has occasional cereal otherwise muffin and peanut butter  Physical activity: exercise: Minimal            Dietician visit: Most recent:2012          Last eye exam: annual    Wt Readings from Last 3 Encounters:  03/04/18 214 lb 6.4 oz (97.3 kg)  02/21/18 207 lb (93.9 kg)  02/19/18 211 lb 6.4 oz (95.9 kg)   Lab Results  Component Value Date   HGBA1C 4.7 (L) 12/07/2017   HGBA1C 5.1 10/15/2017   HGBA1C 5.5 08/24/2017   Lab Results  Component Value Date   MICROALBUR 6.4 (H) 10/15/2017   LDLCALC 70  02/19/2018   CREATININE 1.54 (H) 02/26/2018    LABS:  Anti-coag visit on 02/26/2018  Component Date Value Ref Range Status  . INR 02/26/2018 2.6  2.0 - 3.0 Final   [facility]  Lab on 02/26/2018  Component Date Value Ref Range Status  . Sodium 02/26/2018 138  135 - 145 mEq/L Final  . Potassium 02/26/2018 3.5  3.5 - 5.1 mEq/L Final  . Chloride 02/26/2018 97  96 - 112 mEq/L Final  . CO2 02/26/2018 30  19 - 32 mEq/L Final  . Glucose, Bld 02/26/2018 108* 70 - 99 mg/dL Final  . BUN 02/26/2018 61* 6 - 23 mg/dL Final  . Creatinine, Ser 02/26/2018 1.54* 0.40 - 1.50 mg/dL Final  . Calcium 02/26/2018 9.9  8.4 - 10.5 mg/dL Final  . GFR 02/26/2018 46.33* >60.00 mL/min Final  . Fructosamine 02/26/2018 272  0 - 285 umol/L Final   Comment: Published reference interval for apparently healthy subjects between age 79 and 93 is 33 - 285 umol/L and in a poorly controlled diabetic population is 228 - 563 umol/L with a mean of 396 umol/L.     Allergies as of 03/04/2018      Reactions   Rifampin Rash   May have been caused by Vancomycin or Rifampin (??)   Vancomycin Rash  May have been caused by Vancomycin or Rifampin (??)      Medication List        Accurate as of 03/04/18 10:23 AM. Always use your most recent med list.          acetaminophen 325 MG tablet Commonly known as:  TYLENOL Take 1-2 tablets (325-650 mg total) by mouth every 4 (four) hours as needed for mild pain.   allopurinol 100 MG tablet Commonly known as:  ZYLOPRIM Take 1 tablet (100 mg total) by mouth daily.   amiodarone 200 MG tablet Commonly known as:  PACERONE Take 1 tablet (200 mg total) by mouth daily.   ezetimibe-simvastatin 10-20 MG tablet Commonly known as:  VYTORIN Take 1 tablet by mouth at bedtime.   metolazone 2.5 MG tablet Commonly known as:  ZAROXOLYN Take 1 tablet (2.5 mg total) by mouth every Monday.   potassium chloride SA 20 MEQ tablet Commonly known as:  K-DUR,KLOR-CON Take 2 tablets (40 mEq  total) by mouth 3 (three) times daily.   senna-docusate 8.6-50 MG tablet Commonly known as:  Senokot-S Take 1 tablet by mouth 2 (two) times daily.   torsemide 100 MG tablet Commonly known as:  DEMADEX Take 1 tablet (100 mg total) by mouth 2 (two) times daily.   warfarin 7.5 MG tablet Commonly known as:  COUMADIN Take as directed by the anticoagulation clinic. If you are unsure how to take this medication, talk to your nurse or doctor. Original instructions:  On Monday, Wednesday and Friday with supper   warfarin 5 MG tablet Commonly known as:  COUMADIN Take as directed by the anticoagulation clinic. If you are unsure how to take this medication, talk to your nurse or doctor. Original instructions:  One pill daily on Tue, Thurs, Sat, Sun       Allergies:  Allergies  Allergen Reactions  . Rifampin Rash    May have been caused by Vancomycin or Rifampin (??)  . Vancomycin Rash    May have been caused by Vancomycin or Rifampin (??)    Past Medical History:  Diagnosis Date  . Carotid artery disease ([place]) 1994   s/p left carotid endarerectomy   . Chronic atrial fibrillation    a. on coumadin   . Chronic diastolic CHF (congestive heart failure) ([place])   . Diabetes mellitus without complication ([place])    dx 2016  . Dyspnea   . Heart murmur   . Hx of CABG    a. 1994  . Hypertension   . OSA (obstructive sleep apnea)   . Rheumatic fever   . S/P AVR (aortic valve replacement)    a. mechanical valve 1996  . Subclavian bypass stenosis ([place])   . 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 Vitamin B12 deficiency     Past Surgical History:  Procedure Laterality Date  . [place]   replaced due to aortic stenosis, St. Jude mechanical prostesis  . CAROTID ENDARTERECTOMY Left 1997   subclavian bypass Done in [place]  . CORONARY ARTERY BYPASS GRAFT  1994   w SVG to RCA and SVG to circumflex  . heart  bypass     Done in [place]  . MULTIPLE EXTRACTIONS WITH ALVEOLOPLASTY Bilateral 12/12/2017   Procedure: Extraction of tooth #'s 1, 12,13,14,15,17,18,19, 29, and 30 with alveoloplasty and gross debridement of remaining teeth`;  Surgeon: Lenn Cal, DDS;  Location: [facility];  Service: Oral Surgery;  Laterality: Bilateral;  MULTIPLE EXTRACTION WITH ALVEOLOPLASTY WITH PRE PROSTHETIC SURGERY AND GROSS DEBRIDEMENT OF REMAINING TEETH  . TEE WITHOUT CARDIOVERSION N/A 10/23/2017   Procedure: TRANSESOPHAGEAL ECHOCARDIOGRAM (TEE);  Surgeon: Larey Dresser, MD;  Location: [facility] ENDOSCOPY;  Service: Cardiovascular;  Laterality: N/A;  . TONSILLECTOMY      Family History  Problem Relation Age of Onset  . Cancer Father        lung   . Hypertension Mother     Social History:  reports that he quit smoking about 25 years ago. His smoking use included cigarettes. He has a 30.00 pack-year smoking history. He has never used smokeless tobacco. He reports that he drinks alcohol. He reports that he does not use drugs.  Review of Systems    HYPERTENSION:  Currently not on any treatment   BP Readings from Last 3 Encounters:  03/04/18 118/60  02/21/18 117/65  02/19/18 (!) 107/56   He continues to be on diuretics, now large doses and including Zaroxolyn  Lab Results  Component Value Date   CREATININE 1.54 (H) 02/26/2018   BUN 61 (H) 02/26/2018   NA 138 02/26/2018   K 3.5 02/26/2018   CL 97 02/26/2018   CO2 30 02/26/2018     HYPERLIPIDEMIA:  Hypercholesterolemia: LDL adequately treated with Vytorin 10/20, prescribed and followed by PCP    Lab Results  Component Value Date   CHOL 137 02/19/2018   HDL 52 02/19/2018   LDLCALC 70 02/19/2018   TRIG 73 02/19/2018   CHOLHDL 2.6 02/19/2018     Last foot exam in 1/19 showing peripheral vascular disease and only mild neuropathy   Examination:   BP 118/60 (BP Location: Left Arm, Patient Position: Sitting, Cuff Size: Normal)   Pulse 87   Ht 6'  2" (1.88 m)   Wt 214 lb 6.4 oz (97.3 kg)   SpO2 (!) 87%   BMI 27.53 kg/m   Body mass index is 27.53 kg/m.    Assesment/PLAN:   Diabetes type 2, Mild with story of obesity  His A1c is falsely low at 4.7 because of his recent significant anemia  Fructosamine is upper normal  Although he is not taking any medication diabetes now with metformin being stopped during the hospitalization his blood sugars still appear to be fairly good He has only 1 unusually high reading of 192 at noon but otherwise his blood sugars are normal Considering his age and comorbid conditions his level of control is still adequate  We will continue to monitor his blood sugars without metformin but encouraged him to do more readings after meals He will call if they are consistently high  Renal function: Slightly worse recently from his diuretics and will defer management to cardiologist   Elayne Snare 03/04/2018, 10:23 AM   Note: This office note was prepared with Dragon voice recognition system technology. Any transcriptional errors that result from this process are unintentional.

## 2018-03-04 NOTE — Patient Instructions (Addendum)
Check blood sugars on waking up days a week  Also check blood sugars about 2 hours after meals and do this after different meals by rotation  Recommended blood sugar levels on waking up are 90-130 and about 2 hours after meal is 130-180  Please bring your blood sugar monitor to each visit, thank you  Anti-coag visit on 02/26/2018  Component Date Value Ref Range Status  . INR 02/26/2018 2.6  2.0 - 3.0 Final   [facility]  Lab on 02/26/2018  Component Date Value Ref Range Status  . Sodium 02/26/2018 138  135 - 145 mEq/L Final  . Potassium 02/26/2018 3.5  3.5 - 5.1 mEq/L Final  . Chloride 02/26/2018 97  96 - 112 mEq/L Final  . CO2 02/26/2018 30  19 - 32 mEq/L Final  . Glucose, Bld 02/26/2018 108* 70 - 99 mg/dL Final  . BUN 02/26/2018 61* 6 - 23 mg/dL Final  . Creatinine, Ser 02/26/2018 1.54* 0.40 - 1.50 mg/dL Final  . Calcium 02/26/2018 9.9  8.4 - 10.5 mg/dL Final  . GFR 02/26/2018 46.33* >60.00 mL/min Final  . Fructosamine 02/26/2018 272  0 - 285 umol/L Final   Comment: Published reference interval for apparently healthy subjects between age 26 and 60 is 35 - 285 umol/L and in a poorly controlled diabetic population is 228 - 563 umol/L with a mean of 396 umol/L.   Hospital Outpatient Visit on 02/19/2018  Component Date Value Ref Range Status  . Sodium 02/19/2018 137  135 - 145 mmol/L Final  . Potassium 02/19/2018 3.3* 3.5 - 5.1 mmol/L Final   HEMOLYSIS AT THIS LEVEL MAY AFFECT RESULT  . Chloride 02/19/2018 94* 98 - 111 mmol/L Final  . CO2 02/19/2018 30  22 - 32 mmol/L Final  . Glucose, Bld 02/19/2018 118* 70 - 99 mg/dL Final  . BUN 02/19/2018 52* 8 - 23 mg/dL Final  . Creatinine, Ser 02/19/2018 1.33* 0.61 - 1.24 mg/dL Final  . Calcium 02/19/2018 9.7  8.9 - 10.3 mg/dL Final  . Total Protein 02/19/2018 7.6  6.5 - 8.1 g/dL Final  . Albumin 02/19/2018 3.7  3.5 - 5.0 g/dL Final  . AST 02/19/2018 38  15 - 41 U/L Final  . ALT 02/19/2018 23  0 - 44 U/L Final  . Alkaline Phosphatase  02/19/2018 146* 38 - 126 U/L Final  . Total Bilirubin 02/19/2018 1.4* 0.3 - 1.2 mg/dL Final  . GFR calc non Af Amer 02/19/2018 49* >60 mL/min Final  . GFR calc Af Amer 02/19/2018 57* >60 mL/min Final   Comment: (NOTE) The eGFR has been calculated using the CKD EPI equation. This calculation has not been validated in all clinical situations. eGFR's persistently <60 mL/min signify possible Chronic Kidney Disease.   Georgiann Hahn gap 02/19/2018 13  5 - 15 Final   Performed at [facility], [place] [address], [place], [place] [address]  . TSH 02/19/2018 8.392* 0.350 - 4.500 uIU/mL Final   Comment: Performed by a 3rd Generation assay with a functional sensitivity of <=0.01 uIU/mL. Performed at [facility], [place] [address], [place], [place] [address]   . Cholesterol 02/19/2018 137  0 - 200 mg/dL Final  . Triglycerides 02/19/2018 73  <150 mg/dL Final  . HDL 02/19/2018 52  >40 mg/dL Final  . Total CHOL/HDL Ratio 02/19/2018 2.6  RATIO Final  . VLDL 02/19/2018 15  0 - 40 mg/dL Final  . LDL Cholesterol 02/19/2018 70  0 - 99 mg/dL Final  Comment:        Total Cholesterol/HDL:CHD Risk Coronary Heart Disease Risk Table                     Men   Women  1/2 Average Risk   3.4   3.3  Average Risk       5.0   4.4  2 X Average Risk   9.6   7.1  3 X Average Risk  23.4   11.0        Use the calculated Patient Ratio above and the CHD Risk Table to determine the patient's CHD Risk.        ATP III CLASSIFICATION (LDL):  <100     mg/dL   Optimal  100-129  mg/dL   Near or Above                    Optimal  130-159  mg/dL   Borderline  160-189  mg/dL   High  >190     mg/dL   Very High Performed at [facility] [address] [place], [place] [address]   Anti-coag visit on 02/18/2018  Component Date Value Ref Range Status  . INR 02/18/2018 3.7* 2.0 - 3.0 Final   [facility]  Hospital Outpatient Visit on 02/12/2018  Component Date Value Ref Range Status  . FVC-Pre 02/12/2018 3.61  L Final  .  FVC-%Pred-Pre 02/12/2018 76  % Final  . FVC-Post 02/12/2018 3.39  L Final  . FVC-%Pred-Post 02/12/2018 71  % Final  . FVC-%Change-Post 02/12/2018 -5  % Final  . FEV1-Pre 02/12/2018 2.12  L Final  . FEV1-%Pred-Pre 02/12/2018 62  % Final  . FEV1-Post 02/12/2018 2.30  L Final  . FEV1-%Pred-Post 02/12/2018 67  % Final  . FEV1-%Change-Post 02/12/2018 8  % Final  . FEV6-Pre 02/12/2018 3.25  L Final  . FEV6-%Pred-Pre 02/12/2018 73  % Final  . FEV6-Post 02/12/2018 3.29  L Final  . FEV6-%Pred-Post 02/12/2018 74  % Final  . FEV6-%Change-Post 02/12/2018 0  % Final  . Pre FEV1/FVC ratio 02/12/2018 59  % Final  . FEV1FVC-%Pred-Pre 02/12/2018 82  % Final  . Post FEV1/FVC ratio 02/12/2018 68  % Final  . FEV1FVC-%Change-Post 02/12/2018 15  % Final  . Pre FEV6/FVC Ratio 02/12/2018 92  % Final  . FEV6FVC-%Pred-Pre 02/12/2018 97  % Final  . Post FEV6/FVC ratio 02/12/2018 97  % Final  . FEV6FVC-%Pred-Post 02/12/2018 103  % Final  . FEV6FVC-%Change-Post 02/12/2018 6  % Final  . FEF 25-75 Pre 02/12/2018 0.82  L/sec Final  . FEF2575-%Pred-Pre 02/12/2018 34  % Final  . FEF 25-75 Post 02/12/2018 0.98  L/sec Final  . FEF2575-%Pred-Post 02/12/2018 41  % Final  . FEF2575-%Change-Post 02/12/2018 19  % Final  . DLCO unc 02/12/2018 9.87  ml/min/mmHg Final  . DLCO unc % pred 02/12/2018 26  % Final  . DL/VA 02/12/2018 1.67  ml/min/mmHg/L Final  . DL/VA % pred 02/12/2018 34  % Final

## 2018-03-05 DIAGNOSIS — Z952 Presence of prosthetic heart valve: Secondary | ICD-10-CM | POA: Diagnosis not present

## 2018-03-05 DIAGNOSIS — I5032 Chronic diastolic (congestive) heart failure: Secondary | ICD-10-CM | POA: Diagnosis not present

## 2018-03-05 DIAGNOSIS — I340 Nonrheumatic mitral (valve) insufficiency: Secondary | ICD-10-CM | POA: Diagnosis not present

## 2018-03-05 DIAGNOSIS — I4891 Unspecified atrial fibrillation: Secondary | ICD-10-CM | POA: Diagnosis not present

## 2018-03-05 NOTE — Progress Notes (Signed)
Cardiology Office Note:    Date:  03/06/2018   ID:  [name], DOB [birth year], MRN [id number]  PCP:  Cassandria Anger, MD  Cardiologist:  [name], MD  Referring MD: Cassandria Anger, MD   Chief Complaint  Patient presents with  . Hypertension  . Sleep Apnea    History of Present Illness:    [name] is a 82 y.o. male with a hx of OSAon PAP, obesity and HTN.  When I last saw him he was complainng of his device not holding water and given that his device was old requested that we order a new PAP device.  He is now back today for followup per insurance requirements to document compliance on new device.  He is doing well with his PAP device and thinks that he has gotten used to it.  He tolerates the mask and feels the pressure is adequate.  Since going on PAP he feels rested in the am and has no significant daytime sleepiness.  He denies any significant mouth or nasal dryness or nasal congestion.  He does not think that he snores.  He is here today to discuss getting a new machine.  This was ordered some time ago but [facility] lost the order 3 times and now his OV is outdated to get a new device.  His device is > 83 years old and he would like a new one.    Past Medical History:  Diagnosis Date  . Carotid artery disease ([place]) 1994   s/p left carotid endarerectomy   . Chronic atrial fibrillation    a. on coumadin   . Chronic diastolic CHF (congestive heart failure) ([place])   . Diabetes mellitus without complication ([place])    dx 2016  . Dyspnea   . Heart murmur   . Hx of CABG    a. 1994  . Hypertension   . OSA (obstructive sleep apnea)   . Rheumatic fever   . S/P AVR (aortic valve replacement)    a. mechanical valve 1996  . Subclavian bypass stenosis ([place])   . 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 Vitamin B12 deficiency     Past Surgical History:  Procedure Laterality Date  . [place]   replaced due to aortic stenosis, St. Jude mechanical prostesis  . CAROTID ENDARTERECTOMY Left 1997   subclavian bypass Done in [place]  . CORONARY ARTERY BYPASS GRAFT  1994   w SVG to RCA and SVG to circumflex  . heart bypass     Done in [place]  . MULTIPLE EXTRACTIONS WITH ALVEOLOPLASTY Bilateral 12/12/2017   Procedure: Extraction of tooth #'s 1, 12,13,14,15,17,18,19, 29, and 30 with alveoloplasty and gross debridement of remaining teeth`;  Surgeon: Lenn Cal, DDS;  Location: [facility];  Service: Oral Surgery;  Laterality: Bilateral;  MULTIPLE EXTRACTION WITH ALVEOLOPLASTY WITH PRE PROSTHETIC SURGERY AND GROSS DEBRIDEMENT OF REMAINING TEETH  . TEE WITHOUT CARDIOVERSION N/A 10/23/2017   Procedure: TRANSESOPHAGEAL ECHOCARDIOGRAM (TEE);  Surgeon: Larey Dresser, MD;  Location: [facility]COPY;  Service: Cardiovascular;  Laterality: N/A;  . TONSILLECTOMY      Current Medications: Current Meds  Medication Sig  . acetaminophen (TYLENOL) 325 MG tablet Take 1-2 tablets (325-650 mg total) by mouth every 4 (four) hours as needed for mild pain.  Marland Kitchen allopurinol (ZYLOPRIM) 100 MG tablet Take 1 tablet (100 mg total) by mouth daily.  Marland Kitchen  amiodarone (PACERONE) 200 MG tablet Take 1 tablet (200 mg total) by mouth daily.  Marland Kitchen ezetimibe-simvastatin (VYTORIN) 10-20 MG tablet Take 1 tablet by mouth at bedtime.  . metolazone (ZAROXOLYN) 2.5 MG tablet Take 1 tablet (2.5 mg total) by mouth every Monday.  . potassium chloride SA (K-DUR,KLOR-CON) 20 MEQ tablet Take 20 mEq by mouth 2 (two) times daily.  Marland Kitchen senna-docusate (SENOKOT-S) 8.6-50 MG tablet Take 1 tablet by mouth 2 (two) times daily.  Marland Kitchen torsemide (DEMADEX) 100 MG tablet Take 1 tablet (100 mg total) by mouth 2 (two) times daily.  Marland Kitchen warfarin (COUMADIN) 5 MG tablet One pill daily on Tue, Thurs, Sat, Sun  . warfarin (COUMADIN) 7.5 MG tablet On Monday, Wednesday and Friday with supper     Allergies:   Rifampin and Vancomycin   Social  History   Socioeconomic History  . Marital status: Married    Spouse name: Not on file  . Number of children: 3  . Years of education: Not on file  . Highest education level: Not on file  Occupational History  . Not on file  Social Needs  . Financial resource strain: Not on file  . Food insecurity:    Worry: Not on file    Inability: Not on file  . Transportation needs:    Medical: Not on file    Non-medical: Not on file  Tobacco Use  . Smoking status: Former Smoker    Packs/day: 1.00    Years: 30.00    Pack years: 30.00    Types: Cigarettes    Last attempt to quit: 04/03/1992    Years since quitting: 25.9  . Smokeless tobacco: Never Used  Substance and Sexual Activity  . Alcohol use: Yes    Comment: 4-6 ounces of wine daily  . Drug use: No    Comment: Half a cup a day.  Marland Kitchen Sexual activity: Not on file  Lifestyle  . Physical activity:    Days per week: Not on file    Minutes per session: Not on file  . Stress: Not on file  Relationships  . Social connections:    Talks on phone: Not on file    Gets together: Not on file    Attends religious service: Not on file    Active member of club or organization: Not on file    Attends meetings of clubs or organizations: Not on file    Relationship status: Not on file  Other Topics Concern  . Not on file  Social History Narrative   Patient is married and lives with his wife.   Youngest daughter lives in [place] also.   Patient has 2 other children.     Family History: The patient's family history includes Cancer in his father; Hypertension in his mother.  ROS:   Please see the history of present illness.    ROS  All other systems reviewed and negative.   EKGs/Labs/Other Studies Reviewed:    The following studies were reviewed today: PAP download  EKG:  EKG is not ordered today.  Recent Labs: 07/10/2017: Pro B Natriuretic peptide (BNP) 411.0 10/18/2017: B Natriuretic Peptide 1,073.3 01/16/2018: Magnesium  1.8 01/28/2018: Hemoglobin 9.2; Platelets 144 02/19/2018: ALT 23; TSH 8.392 02/26/2018: BUN 61; Creatinine, Ser 1.54; Potassium 3.5; Sodium 138   Recent Lipid Panel    Component Value Date/Time   CHOL 137 02/19/2018 1518   TRIG 73 02/19/2018 1518   HDL 52 02/19/2018 1518   CHOLHDL 2.6 02/19/2018 1518  VLDL 15 02/19/2018 1518   LDLCALC 70 02/19/2018 1518    Physical Exam:    VS:  BP 112/62   Pulse 78   Ht 6\' 2"  (1.88 m)   Wt 218 lb 6.4 oz (99.1 kg)   SpO2 95%   BMI 28.04 kg/m     Wt Readings from Last 3 Encounters:  03/06/18 218 lb 6.4 oz (99.1 kg)  03/04/18 214 lb 6.4 oz (97.3 kg)  02/21/18 207 lb (93.9 kg)     GEN:  Well nourished, well developed in no acute distress HEENT: Normal NECK: No JVD; No carotid bruits LYMPHATICS: No lymphadenopathy CARDIAC: RRR, no murmurs, rubs, gallops RESPIRATORY:  Clear to auscultation without rales, wheezing or rhonchi  ABDOMEN: Soft, non-tender, non-distended MUSCULOSKELETAL:  No edema; No deformity  SKIN: Warm and dry NEUROLOGIC:  Alert and oriented x 3 PSYCHIATRIC:  Normal affect   ASSESSMENT:    1. OSA (obstructive sleep apnea)   2. Essential hypertension   3. Obesity (BMI 30-39.9)    PLAN:    In order of problems listed above:  1.  OSA - the patient is tolerating PAP therapy well without any problems. The PAP download was reviewed today and showed an AHI of 13.4/hr on auto BiPAP with 64% compliance in using more than 4 hours nightly.  The patient has been using and benefiting from PAP use and will continue to benefit from therapy. His machine is > 67 years old and he would like a new machine.  I will order an Airsense BiPAP on Auto settings with heated humidity, mask of choice and supplies.  He will need to see me back again in 10 weeks from date of delivery of new device to document compliance.   2.  HTN - BP is controlled on exam today.  He is not on antihypertensive meds at this time except diuretics.   3.  Obesity -  I have encouraged him to get into a routine exercise program and cut back on carbs and portions.    Medication Adjustments/Labs and Tests Ordered: Current medicines are reviewed at length with the patient today.  Concerns regarding medicines are outlined above.  Orders Placed This Encounter  Procedures  . Bipap   No orders of the defined types were placed in this encounter.   Signed, Fransico Him, MD  03/06/2018 8:58 AM    [place]

## 2018-03-06 ENCOUNTER — Encounter: Payer: Self-pay | Admitting: Cardiology

## 2018-03-06 ENCOUNTER — Ambulatory Visit (INDEPENDENT_AMBULATORY_CARE_PROVIDER_SITE_OTHER): Payer: Medicare Other | Admitting: Cardiology

## 2018-03-06 VITALS — BP 112/62 | HR 78 | Ht 74.0 in | Wt 218.4 lb

## 2018-03-06 DIAGNOSIS — G4733 Obstructive sleep apnea (adult) (pediatric): Secondary | ICD-10-CM

## 2018-03-06 DIAGNOSIS — E669 Obesity, unspecified: Secondary | ICD-10-CM | POA: Diagnosis not present

## 2018-03-06 DIAGNOSIS — I10 Essential (primary) hypertension: Secondary | ICD-10-CM

## 2018-03-06 DIAGNOSIS — I25708 Atherosclerosis of coronary artery bypass graft(s), unspecified, with other forms of angina pectoris: Secondary | ICD-10-CM | POA: Diagnosis not present

## 2018-03-06 NOTE — Patient Instructions (Signed)
Medication Instructions:  Your physician recommends that you continue on your current medications as directed. Please refer to the Current Medication list given to you today.  If you need a refill on your cardiac medications before your next appointment, please call your pharmacy.   Lab work:  If you have labs (blood work) drawn today and your tests are completely normal, you will receive your results only by: Marland Kitchen MyChart Message (if you have MyChart) OR . A paper copy in the mail If you have any lab test that is abnormal or we need to change your treatment, we will call you to review the results.  Follow-Up: Call the office, once you received your device and ask for our sleep coordinator.

## 2018-03-07 ENCOUNTER — Telehealth: Payer: Self-pay | Admitting: *Deleted

## 2018-03-07 DIAGNOSIS — I2510 Atherosclerotic heart disease of native coronary artery without angina pectoris: Secondary | ICD-10-CM | POA: Diagnosis not present

## 2018-03-07 DIAGNOSIS — N183 Chronic kidney disease, stage 3 (moderate): Secondary | ICD-10-CM | POA: Diagnosis not present

## 2018-03-07 DIAGNOSIS — E1122 Type 2 diabetes mellitus with diabetic chronic kidney disease: Secondary | ICD-10-CM | POA: Diagnosis not present

## 2018-03-07 DIAGNOSIS — I130 Hypertensive heart and chronic kidney disease with heart failure and stage 1 through stage 4 chronic kidney disease, or unspecified chronic kidney disease: Secondary | ICD-10-CM | POA: Diagnosis not present

## 2018-03-07 DIAGNOSIS — I5033 Acute on chronic diastolic (congestive) heart failure: Secondary | ICD-10-CM | POA: Diagnosis not present

## 2018-03-07 DIAGNOSIS — D649 Anemia, unspecified: Secondary | ICD-10-CM | POA: Diagnosis not present

## 2018-03-07 NOTE — Telephone Encounter (Signed)
Order placed to [facility]. Upon patient request DME selection is [facility]. Patient understands he will be contacted by [facility] to set up his cpap. Patient understands to call if [facility] does not contact him with new setup in a timely manner. Patient understands they will be called once confirmation has been received from [facility] that they have received their new machine to schedule 10 week follow up appointment.  [facility] notified of new cpap order  Please add to airview Patient was grateful for the call and thanked me.

## 2018-03-07 NOTE — Telephone Encounter (Signed)
-----   Message from Sarina Ill, RN sent at 03/06/2018 10:37 AM EST ----- Regarding: Sleep Hello, Dr. Radford Pax ordered a Airsens BiPAP with heated humidity, mask of choice. Please refer to [facility]. Device ordered please send. Thanks, [employer]

## 2018-03-12 ENCOUNTER — Ambulatory Visit (INDEPENDENT_AMBULATORY_CARE_PROVIDER_SITE_OTHER): Payer: Medicare Other

## 2018-03-12 DIAGNOSIS — Z952 Presence of prosthetic heart valve: Secondary | ICD-10-CM | POA: Diagnosis not present

## 2018-03-12 DIAGNOSIS — I4891 Unspecified atrial fibrillation: Secondary | ICD-10-CM

## 2018-03-12 DIAGNOSIS — Z7189 Other specified counseling: Secondary | ICD-10-CM | POA: Diagnosis not present

## 2018-03-12 LAB — POCT INR: INR: 3.6 — AB (ref 2.0–3.0)

## 2018-03-12 NOTE — Patient Instructions (Signed)
Description   Take 1/2 tablet today, then start taking 5mg  daily.  Recheck INR in 3 weeks.  Call with any new medications or procedures [phone].

## 2018-03-20 NOTE — Progress Notes (Signed)
Advanced Heart Failure Clinic Note   PCP: [name], [name], MD PCP-Cardiologist: [name], MD  HF: [name]  HPI: [name] is a 82 y.o. male with h/o mechanical AVR 1994, OSA on CPAP, Chronic afib, chronic combined CHF, CKD III, chronic coumadin therapy, CAD s/p CAGB 1994, and COPD.   Admitted 7/18-11/05/17 from [facility] office with A/C diastolic HF. Diuresed 83 lbs with lasix drip, metolazone, and diamox. Transitioned to torsemide 80 mg BID. Determined not to be a candidate for MitraClip. Dr [name] also consulted and recommended aggressive medical therapy versus  percutaneous MVR trial as he felt very high risk for conventional surgery. Course complicated by drop in hemoglobin and symptomatic hypotension, requiring 1 unit pRBCs. GI consulted and did not recommend scoping. BB and spiro were stopped due to low BPs. Had some AKI assoicated with low BP, but resolved by discharge. Required lovenox bridge at DC with subtherapeutic INR. Discharged with [facility]. DC weight: 216 lbs.   Admitted 9/11 - 12/14/17 for dental extractions in setting of work up for MVR. Hospital course complicated by run of VT in the setting of hypokalemia. Started on amiodarone with h/o of arrhythmia. Bridged with lovenox.   Admitted 9/22 - 12/28/17 with symptomatic anemia from bleeding gums. Bleeding stopped with holding heparin and coumadin. Received multiple units of blood.   He was admitted again in 10/19 with left thigh hematoma and went from there to inpatient rehab.   He returns today for HF follow up. Overall doing really well. No longer has HH PT. Getting around with rollator. Able to walk from [facility] with no SOB. He has been seen in [facility] and goes back in January for further [facility] MVR evaluation. Sleeps at an incline at baseline. Has mild ankle edema. Denies dizziness or CP. Wearing CPAP qHS. No bleeding on coumadin. Taking all medications. Weights trend up to 207-210 until he takes metolazone  and then go down to ~200 lbs.   Labs (10/19): hgb 9.2 Labs (11/19): K 3.2, creatinine 1.31  Review of systems complete and found to be negative unless listed in HPI.   PMH: 1. Carotid stenosis: S/p left CEA in 1994.  2. Atrial fibrillation: Chronic.  He is on warfarin with mechanical aortic valve.  3. Type II diabetes.  4. COPD: PFTs (11/19) with moderate obstructive airways disease and severely decreased DLCO.  5. OSA: Uses CPAP.  6. 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  7. ITP 8. VT: On amiodarone.  9. CKD: Stage 3.  10. 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  11. Chronic diastolic CHF: TEE (3/38) with EF 55-60%, D-shaped septum, mildly dilated RV with mildly decreased systolic function, mechanical aortic valve, rheumatic-appearing mitral valve with severe MR, minimal stenosis.  12. ABIs (12/18): Normal.  13. CAD: S/p CABG 1994.    Current Outpatient Medications  Medication Sig Dispense Refill  . acetaminophen (TYLENOL) 325 MG tablet Take 1-2 tablets (325-650 mg total) by mouth every 4 (four) hours as needed for mild pain.    Marland Kitchen allopurinol (ZYLOPRIM) 100 MG tablet Take 1 tablet (100 mg total) by mouth daily. 30 tablet 3  . amiodarone (PACERONE) 200 MG tablet Take 1 tablet (200 mg total) by mouth daily. 30 tablet 0  . ezetimibe-simvastatin (VYTORIN) 10-20 MG tablet Take 1 tablet by mouth at bedtime. 90 tablet 3  . metolazone (ZAROXOLYN) 2.5 MG tablet Take 1 tablet (  2.5 mg total) by mouth every Monday. 10 tablet 0  . potassium chloride SA (K-DUR,KLOR-CON) 20 MEQ tablet Take 40 mEq by mouth 3 (three) times daily.     Marland Kitchen senna-docusate (SENOKOT-S) 8.6-50 MG tablet Take 1 tablet by mouth 2 (two) times daily. 60 tablet 0  . torsemide (DEMADEX) 100 MG tablet Take 1 tablet (100 mg total) by mouth 2 (two) times daily. 60 tablet 0  . warfarin (COUMADIN) 5 MG tablet One pill daily  on Tue, Thurs, Sat, Sun 30 tablet 0  . warfarin (COUMADIN) 7.5 MG tablet On Monday, Wednesday and Friday with supper 30 tablet 0   No current facility-administered medications for this encounter.    Allergies  Allergen Reactions  . Rifampin Rash    May have been caused by Vancomycin or Rifampin (??)  . Vancomycin Rash    May have been caused by Vancomycin or Rifampin (??)   Social History   Socioeconomic History  . Marital status: Married    Spouse name: Not on file  . Number of children: 3  . Years of education: Not on file  . Highest education level: Not on file  Occupational History  . Not on file  Social Needs  . Financial resource strain: Not on file  . Food insecurity:    Worry: Not on file    Inability: Not on file  . Transportation needs:    Medical: Not on file    Non-medical: Not on file  Tobacco Use  . Smoking status: Former Smoker    Packs/day: 1.00    Years: 30.00    Pack years: 30.00    Types: Cigarettes    Last attempt to quit: 04/03/1992    Years since quitting: 25.9  . Smokeless tobacco: Never Used  Substance and Sexual Activity  . Alcohol use: Yes    Comment: 4-6 ounces of wine daily  . Drug use: No    Comment: Half a cup a day.  Marland Kitchen Sexual activity: Not on file  Lifestyle  . Physical activity:    Days per week: Not on file    Minutes per session: Not on file  . Stress: Not on file  Relationships  . Social connections:    Talks on phone: Not on file    Gets together: Not on file    Attends religious service: Not on file    Active member of club or organization: Not on file    Attends meetings of clubs or organizations: Not on file    Relationship status: Not on file  . Intimate partner violence:    Fear of current or ex partner: No    Emotionally abused: No    Physically abused: No    Forced sexual activity: No  Other Topics Concern  . Not on file  Social History Narrative   Patient is married and lives with his wife.   Youngest  daughter lives in [place] also.   Patient has 2 other children.    Family History  Problem Relation Age of Onset  . Cancer Father        lung   . Hypertension Mother    Vitals:   03/21/18 1207  BP: 110/62  Pulse: 76  SpO2: 95%  Weight: 94.3 kg (207 lb 12.8 oz)     Wt Readings from Last 3 Encounters:  03/21/18 94.3 kg (207 lb 12.8 oz)  03/06/18 99.1 kg (218 lb 6.4 oz)  03/04/18 97.3 kg (214 lb 6.4 oz)  PHYSICAL EXAM: General: Well appearing. No resp difficulty. HEENT: Normal Neck: Supple. JVP 6-7. Carotids 2+ bilat; no bruits. No thyromegaly or nodule noted. Cor: PMI nondisplaced. IRR, 3/6 HSM at apex Lungs: CTAB, normal effort. Abdomen: Soft, non-tender, non-distended, no HSM. No bruits or masses. +BS  Extremities: No cyanosis, clubbing, or rash. R and LLE trace ankle edema. BLE TED hose.  Neuro: Alert & orientedx3, cranial nerves grossly intact. moves all 4 extremities w/o difficulty. Affect pleasant   ASSESSMENT & PLAN: 1. Chronic diastolic CHF with prominent RV failure:  Echo 10/2017 with EF 55-60%, mechanical aortic valve functioning normally, mild mitral stenosis/severe mitral regurgitation, RV moderately dilated with moderately decreased systolic function, PASP 49 mmHg.  Severe MR likely plays a significant role in CHF and RV failure. NYHA class III.  Volume stable on exam.    - Continue torsemide 100 mg bid with metolazone once a week on Mondays. Taking potassium 40/60/40 daily. BMET today - Eventually would likely benefit from restarting spironolactone. Creatinine was trending up on last check. Will recheck today and if stable or better, will add back spiro 12.5 mg daily.  2. Mechanical aortic valve: Appeared to function well on last echo.  - Continue warfarin with INR goal 2.5-3.5 (also with atrial fibrillation).  - He is off aspirin with recent thigh hematoma.  Denies bleeding. Check CBC today per wife's request. 3. Atrial fibrillation: Chronic, rate is  controlled. Continue warfarin.  Denies bleeding.  4. CAD: s/p CABG.  No chest pain.  - No ASA given stable CAD with warfarin use.   - Continue Vytorin. LDL 70 02/2018 5. COPD: He is no longer using oxygen during the day.  Moderate COPD on 11/19 PFTs.   6. OSA: Continue nightly BiPAP. No change.  7.CKD Stage 3: BMET today 8. Thrombocytopenia: Chronic, has been mild/stable.  CBC today per wife's request. 9. Severe MR: TEE 7/19 with severe MR with possible rheumatic MV (do not think MS is significant, mildly elevated mean gradient from high flow with severe MR).  Structural Heart team evaluated, not a Mitraclip candidate. Seen by Dr. [name], would be high risk for open surgery.   - He is being evaluated at [facility] for percutaneous MVR.  Goes back in January for a CT to look at LV size. 10. VT: He is on amiodarone to suppress VT.  - LFTs normal 02/2018. TSH was mildly elevated to 8.9 02/2018. Recheck TFTs today. He has an endocrinologist for his DM. - Will need regular eye exam on amiodarone.   BMET, mag, TFTs, CBC If renal function stable or better, will resume spiro 12.5 mg daily Keep follow up with [name] in 8 weeks  Georgiana Shore, NP 03/21/2018  Greater than 50% of the 25 minute visit was spent in counseling/coordination of care regarding disease state education, salt/fluid restriction, sliding scale diuretics, and medication compliance.

## 2018-03-21 ENCOUNTER — Ambulatory Visit (HOSPITAL_COMMUNITY)
Admission: RE | Admit: 2018-03-21 | Discharge: 2018-03-21 | Disposition: A | Discharge status: Home / Self Care | Payer: Medicare Other | Source: Ambulatory Visit | Attending: Internal Medicine | Admitting: Internal Medicine

## 2018-03-21 ENCOUNTER — Telehealth (HOSPITAL_COMMUNITY): Payer: Self-pay | Admitting: Cardiology

## 2018-03-21 VITALS — BP 110/62 | HR 76 | Wt 207.8 lb

## 2018-03-21 DIAGNOSIS — G4733 Obstructive sleep apnea (adult) (pediatric): Secondary | ICD-10-CM | POA: Insufficient documentation

## 2018-03-21 DIAGNOSIS — Z951 Presence of aortocoronary bypass graft: Secondary | ICD-10-CM | POA: Insufficient documentation

## 2018-03-21 DIAGNOSIS — I2510 Atherosclerotic heart disease of native coronary artery without angina pectoris: Secondary | ICD-10-CM | POA: Insufficient documentation

## 2018-03-21 DIAGNOSIS — I340 Nonrheumatic mitral (valve) insufficiency: Secondary | ICD-10-CM | POA: Diagnosis not present

## 2018-03-21 DIAGNOSIS — D696 Thrombocytopenia, unspecified: Secondary | ICD-10-CM | POA: Diagnosis not present

## 2018-03-21 DIAGNOSIS — J449 Chronic obstructive pulmonary disease, unspecified: Secondary | ICD-10-CM | POA: Insufficient documentation

## 2018-03-21 DIAGNOSIS — Z7901 Long term (current) use of anticoagulants: Secondary | ICD-10-CM | POA: Insufficient documentation

## 2018-03-21 DIAGNOSIS — Z79899 Other long term (current) drug therapy: Secondary | ICD-10-CM | POA: Insufficient documentation

## 2018-03-21 DIAGNOSIS — I4891 Unspecified atrial fibrillation: Secondary | ICD-10-CM

## 2018-03-21 DIAGNOSIS — I252 Old myocardial infarction: Secondary | ICD-10-CM

## 2018-03-21 DIAGNOSIS — N183 Chronic kidney disease, stage 3 (moderate): Secondary | ICD-10-CM | POA: Insufficient documentation

## 2018-03-21 DIAGNOSIS — I5032 Chronic diastolic (congestive) heart failure: Secondary | ICD-10-CM | POA: Insufficient documentation

## 2018-03-21 DIAGNOSIS — Z8249 Family history of ischemic heart disease and other diseases of the circulatory system: Secondary | ICD-10-CM | POA: Insufficient documentation

## 2018-03-21 DIAGNOSIS — I482 Chronic atrial fibrillation, unspecified: Secondary | ICD-10-CM

## 2018-03-21 DIAGNOSIS — Z87891 Personal history of nicotine dependence: Secondary | ICD-10-CM | POA: Insufficient documentation

## 2018-03-21 DIAGNOSIS — Z952 Presence of prosthetic heart valve: Secondary | ICD-10-CM | POA: Diagnosis not present

## 2018-03-21 LAB — CBC
HEMATOCRIT: 35.6 % — AB (ref 39.0–52.0)
Hemoglobin: 11.8 g/dL — ABNORMAL LOW (ref 13.0–17.0)
MCH: 33.1 pg (ref 26.0–34.0)
MCHC: 33.1 g/dL (ref 30.0–36.0)
MCV: 100 fL (ref 80.0–100.0)
Platelets: 169 10*3/uL (ref 150–400)
RBC: 3.56 MIL/uL — ABNORMAL LOW (ref 4.22–5.81)
RDW: 18.9 % — AB (ref 11.5–15.5)
WBC: 6.7 10*3/uL (ref 4.0–10.5)
nRBC: 0 % (ref 0.0–0.2)

## 2018-03-21 LAB — BASIC METABOLIC PANEL
Anion gap: 15 (ref 5–15)
BUN: 48 mg/dL — ABNORMAL HIGH (ref 8–23)
CHLORIDE: 89 mmol/L — AB (ref 98–111)
CO2: 29 mmol/L (ref 22–32)
CREATININE: 1.48 mg/dL — AB (ref 0.61–1.24)
Calcium: 9.1 mg/dL (ref 8.9–10.3)
GFR calc Af Amer: 51 mL/min — ABNORMAL LOW (ref >60)
GFR calc non Af Amer: 44 mL/min — ABNORMAL LOW (ref >60)
Glucose, Bld: 121 mg/dL — ABNORMAL HIGH (ref 70–99)
Potassium: 3.1 mmol/L — ABNORMAL LOW (ref 3.5–5.1)
Sodium: 133 mmol/L — ABNORMAL LOW (ref 135–145)

## 2018-03-21 LAB — TSH: TSH: 9.579 u[IU]/mL — ABNORMAL HIGH (ref 0.350–4.500)

## 2018-03-21 LAB — T4, FREE: FREE T4: 0.84 ng/dL (ref 0.82–1.77)

## 2018-03-21 LAB — MAGNESIUM: Magnesium: 2.1 mg/dL (ref 1.7–2.4)

## 2018-03-21 MED ORDER — SPIRONOLACTONE 25 MG PO TABS: 12.5000 mg | ORAL_TABLET | Freq: Every day | ORAL | 3 refills | Status: DC

## 2018-03-21 NOTE — Telephone Encounter (Signed)
Notes recorded by Kerry Dory, CMA on 03/21/2018 at 4:27 PM EST Patient aware. Patient voiced understanding, repeat labs at PCP appt next week   ------  Notes recorded by Georgiana Shore, NP on 03/21/2018 at 1:45 PM EST Renal function stable. Potassium is on the low side. Please have him start spiro 12.5 mg daily (we discussed in clinic today) and recheck BMET 7-10 days to check on kidney function and potassium. It looks like he has a PCP appointment next week so if he is able to go ahead and start today, we could see if they could draw BMET for [place]. Thanks!

## 2018-03-21 NOTE — Telephone Encounter (Signed)
-----   Message from Georgiana Shore, NP sent at 03/21/2018  1:45 PM EST ----- Renal function stable. Potassium is on the low side. Please have him start spiro 12.5 mg daily (we discussed in clinic today) and recheck BMET 7-10 days to check on kidney function and potassium. It looks like he has a PCP appointment next week so if he is able to go ahead and start today, we could see if they could draw BMET for [place]. Thanks!

## 2018-03-21 NOTE — Patient Instructions (Signed)
Labs done today  Keep follow up appointment with Dr. [name]

## 2018-03-22 LAB — T3, FREE: T3, Free: 2.1 pg/mL (ref 2.0–4.4)

## 2018-03-26 ENCOUNTER — Encounter: Payer: Self-pay | Admitting: Internal Medicine

## 2018-03-26 ENCOUNTER — Other Ambulatory Visit (INDEPENDENT_AMBULATORY_CARE_PROVIDER_SITE_OTHER): Payer: Medicare Other

## 2018-03-26 ENCOUNTER — Ambulatory Visit (INDEPENDENT_AMBULATORY_CARE_PROVIDER_SITE_OTHER): Payer: Medicare Other | Admitting: Internal Medicine

## 2018-03-26 VITALS — BP 116/62 | HR 71 | Temp 98.2°F | Ht 74.0 in | Wt 208.0 lb

## 2018-03-26 DIAGNOSIS — N390 Urinary tract infection, site not specified: Secondary | ICD-10-CM | POA: Diagnosis not present

## 2018-03-26 DIAGNOSIS — I4819 Other persistent atrial fibrillation: Secondary | ICD-10-CM | POA: Diagnosis not present

## 2018-03-26 DIAGNOSIS — R5381 Other malaise: Secondary | ICD-10-CM

## 2018-03-26 DIAGNOSIS — Z23 Encounter for immunization: Secondary | ICD-10-CM

## 2018-03-26 DIAGNOSIS — I5032 Chronic diastolic (congestive) heart failure: Secondary | ICD-10-CM

## 2018-03-26 DIAGNOSIS — I252 Old myocardial infarction: Secondary | ICD-10-CM

## 2018-03-26 DIAGNOSIS — T83511D Infection and inflammatory reaction due to indwelling urethral catheter, subsequent encounter: Secondary | ICD-10-CM | POA: Diagnosis not present

## 2018-03-26 DIAGNOSIS — I2510 Atherosclerotic heart disease of native coronary artery without angina pectoris: Secondary | ICD-10-CM

## 2018-03-26 LAB — CBC WITH DIFFERENTIAL/PLATELET
BASOS ABS: 0 10*3/uL (ref 0.0–0.1)
Basophils Relative: 0.8 % (ref 0.0–3.0)
EOS ABS: 0.1 10*3/uL (ref 0.0–0.7)
Eosinophils Relative: 2.7 % (ref 0.0–5.0)
HEMATOCRIT: 34.5 % — AB (ref 39.0–52.0)
Hemoglobin: 11.8 g/dL — ABNORMAL LOW (ref 13.0–17.0)
LYMPHS ABS: 1 10*3/uL (ref 0.7–4.0)
LYMPHS PCT: 18 % (ref 12.0–46.0)
MCHC: 34.2 g/dL (ref 30.0–36.0)
MCV: 98.4 fl (ref 78.0–100.0)
Monocytes Absolute: 0.5 10*3/uL (ref 0.1–1.0)
Monocytes Relative: 9.1 % (ref 3.0–12.0)
NEUTROS ABS: 3.7 10*3/uL (ref 1.4–7.7)
NEUTROS PCT: 69.4 % (ref 43.0–77.0)
Platelets: 160 10*3/uL (ref 150.0–400.0)
RBC: 3.51 Mil/uL — ABNORMAL LOW (ref 4.22–5.81)
RDW: 20.5 % — ABNORMAL HIGH (ref 11.5–15.5)
WBC: 5.3 10*3/uL (ref 4.0–10.5)

## 2018-03-26 LAB — URINALYSIS, ROUTINE W REFLEX MICROSCOPIC
BILIRUBIN URINE: NEGATIVE
Ketones, ur: NEGATIVE
NITRITE: POSITIVE — AB
Specific Gravity, Urine: 1.01 (ref 1.000–1.030)
TOTAL PROTEIN, URINE-UPE24: NEGATIVE
URINE GLUCOSE: NEGATIVE
UROBILINOGEN UA: 0.2 (ref 0.0–1.0)
pH: 6.5 (ref 5.0–8.0)

## 2018-03-26 LAB — BASIC METABOLIC PANEL
BUN: 42 mg/dL — ABNORMAL HIGH (ref 6–23)
CHLORIDE: 92 meq/L — AB (ref 96–112)
CO2: 33 mEq/L — ABNORMAL HIGH (ref 19–32)
Calcium: 10 mg/dL (ref 8.4–10.5)
Creatinine, Ser: 1.31 mg/dL (ref 0.40–1.50)
GFR: 55.82 mL/min — ABNORMAL LOW (ref >60.00)
Glucose, Bld: 113 mg/dL — ABNORMAL HIGH (ref 70–99)
POTASSIUM: 3.3 meq/L — AB (ref 3.5–5.1)
SODIUM: 135 meq/L (ref 135–145)

## 2018-03-26 NOTE — Assessment & Plan Note (Addendum)
He is in the research study for a new MVR in [place] per pt Demadex, Spironolactone

## 2018-03-26 NOTE — Addendum Note (Signed)
Addended by: Karren Cobble on: 03/26/2018 11:47 AM   Modules accepted: Orders

## 2018-03-26 NOTE — Assessment & Plan Note (Signed)
Pacerone Coumadin Diuretics

## 2018-03-26 NOTE — Assessment & Plan Note (Signed)
Doing much better

## 2018-03-26 NOTE — Progress Notes (Signed)
Subjective:  Patient ID: [name], male    DOB: 03-20-1936  Age: 82 y.o. MRN: [id number]  CC: No chief complaint on file.   HPI [name] presents for CHF, DM, edema f/u. Pt lost a lot of wt - 100 lbs He is in the research study for a new MVR in [place] per pt  Off O2  Outpatient Medications Prior to Visit  Medication Sig Dispense Refill  . acetaminophen (TYLENOL) 325 MG tablet Take 1-2 tablets (325-650 mg total) by mouth every 4 (four) hours as needed for mild pain.    Marland Kitchen allopurinol (ZYLOPRIM) 100 MG tablet Take 1 tablet (100 mg total) by mouth daily. 30 tablet 3  . amiodarone (PACERONE) 200 MG tablet Take 1 tablet (200 mg total) by mouth daily. 30 tablet 0  . ezetimibe-simvastatin (VYTORIN) 10-20 MG tablet Take 1 tablet by mouth at bedtime. 90 tablet 3  . metolazone (ZAROXOLYN) 2.5 MG tablet Take 1 tablet (2.5 mg total) by mouth every Monday. 10 tablet 0  . potassium chloride SA (K-DUR,KLOR-CON) 20 MEQ tablet Take 40 mEq by mouth 3 (three) times daily.     Marland Kitchen senna-docusate (SENOKOT-S) 8.6-50 MG tablet Take 1 tablet by mouth 2 (two) times daily. 60 tablet 0  . spironolactone (ALDACTONE) 25 MG tablet Take 0.5 tablets (12.5 mg total) by mouth daily. 45 tablet 3  . torsemide (DEMADEX) 100 MG tablet Take 1 tablet (100 mg total) by mouth 2 (two) times daily. 60 tablet 0  . warfarin (COUMADIN) 5 MG tablet One pill daily on Tue, Thurs, Sat, Sun 30 tablet 0  . warfarin (COUMADIN) 7.5 MG tablet On Monday, Wednesday and Friday with supper 30 tablet 0   No facility-administered medications prior to visit.     ROS: Review of Systems  Constitutional: Positive for fatigue and unexpected weight change. Negative for appetite change.  HENT: Negative for congestion, nosebleeds, sneezing, sore throat and trouble swallowing.   Eyes: Negative for itching and visual disturbance.  Respiratory: Positive for shortness of breath. Negative for cough.   Cardiovascular: Negative for chest pain,  palpitations and leg swelling.  Gastrointestinal: Negative for abdominal distention, blood in stool, diarrhea and nausea.  Genitourinary: Negative for frequency and hematuria.  Musculoskeletal: Positive for arthralgias. Negative for back pain, gait problem, joint swelling and neck pain.  Skin: Negative for rash.  Neurological: Positive for weakness. Negative for dizziness, tremors and speech difficulty.  Psychiatric/Behavioral: Negative for agitation, dysphoric mood and sleep disturbance. The patient is not nervous/anxious.     Objective:  BP 116/62 (BP Location: Right Arm, Patient Position: Sitting, Cuff Size: Large)   Pulse 71   Temp 98.2 F (36.8 C) (Oral)   Ht 6\' 2"  (1.88 m)   Wt 208 lb (94.3 kg)   SpO2 94%   BMI 26.71 kg/m   BP Readings from Last 3 Encounters:  03/26/18 116/62  03/21/18 110/62  03/06/18 112/62    Wt Readings from Last 3 Encounters:  03/26/18 208 lb (94.3 kg)  03/21/18 207 lb 12.8 oz (94.3 kg)  03/06/18 218 lb 6.4 oz (99.1 kg)    Physical Exam Constitutional:      General: He is not in acute distress.    Appearance: He is well-developed.     Comments: NAD  Eyes:     Conjunctiva/sclera: Conjunctivae normal.     Pupils: Pupils are equal, round, and reactive to light.  Neck:     Musculoskeletal: Normal range of motion.     Thyroid:  No thyromegaly.     Vascular: No JVD.  Cardiovascular:     Rate and Rhythm: Normal rate. Rhythm irregular.     Heart sounds: Normal heart sounds. No murmur. No friction rub. No gallop.   Pulmonary:     Effort: Pulmonary effort is normal. No respiratory distress.     Breath sounds: Normal breath sounds. No wheezing or rales.  Chest:     Chest wall: No tenderness.  Abdominal:     General: Bowel sounds are normal. There is no distension.     Palpations: Abdomen is soft. There is no mass.     Tenderness: There is no abdominal tenderness. There is no guarding or rebound.  Musculoskeletal: Normal range of motion.         General: Tenderness present.  Lymphadenopathy:     Cervical: No cervical adenopathy.  Skin:    General: Skin is warm and dry.     Findings: No rash.  Neurological:     Mental Status: He is alert and oriented to person, place, and time.     Cranial Nerves: No cranial nerve deficit.     Motor: No abnormal muscle tone.     Coordination: Coordination normal.     Gait: Gait normal.     Deep Tendon Reflexes: Reflexes are normal and symmetric.  Psychiatric:        Behavior: Behavior normal.        Thought Content: Thought content normal.        Judgment: Judgment normal.   Cane No edema B  Off O2 Lab Results  Component Value Date   WBC 6.7 03/21/2018   HGB 11.8 (L) 03/21/2018   HCT 35.6 (L) 03/21/2018   PLT 169 03/21/2018   GLUCOSE 121 (H) 03/21/2018   CHOL 137 02/19/2018   TRIG 73 02/19/2018   HDL 52 02/19/2018   LDLCALC 70 02/19/2018   ALT 23 02/19/2018   AST 38 02/19/2018   NA 133 (L) 03/21/2018   K 3.1 (L) 03/21/2018   CL 89 (L) 03/21/2018   CREATININE 1.48 (H) 03/21/2018   BUN 48 (H) 03/21/2018   CO2 29 03/21/2018   TSH 9.579 (H) 03/21/2018   INR 3.6 (A) 03/12/2018   HGBA1C 4.7 (L) 12/07/2017   MICROALBUR 6.4 (H) 10/15/2017    No results found.  Assessment & Plan:   There are no diagnoses linked to this encounter.   No orders of the defined types were placed in this encounter.    Follow-up: No follow-ups on file.  Walker Kehr, MD

## 2018-03-27 ENCOUNTER — Other Ambulatory Visit: Payer: Self-pay | Admitting: Internal Medicine

## 2018-03-27 MED ORDER — CEFUROXIME AXETIL 250 MG PO TABS: 250.0000 mg | ORAL_TABLET | Freq: Two times a day (BID) | ORAL | 0 refills | Status: AC

## 2018-04-02 ENCOUNTER — Ambulatory Visit (INDEPENDENT_AMBULATORY_CARE_PROVIDER_SITE_OTHER): Payer: Medicare Other | Admitting: Pharmacist

## 2018-04-02 DIAGNOSIS — Z952 Presence of prosthetic heart valve: Secondary | ICD-10-CM

## 2018-04-02 DIAGNOSIS — Z7189 Other specified counseling: Secondary | ICD-10-CM

## 2018-04-02 DIAGNOSIS — I4891 Unspecified atrial fibrillation: Secondary | ICD-10-CM | POA: Diagnosis not present

## 2018-04-02 LAB — POCT INR: INR: 4 — AB (ref 2.0–3.0)

## 2018-04-02 NOTE — Patient Instructions (Signed)
Description   Skip your Coumadin today, then start taking 1 tablet daily except 1/2 tablet on Fridays.  Recheck INR in 2 weeks.  Call with any new medications or procedures [phone].

## 2018-04-02 NOTE — Telephone Encounter (Signed)
Reached out to patient to assure him that when people get back to work from the holidays things will get moving back on schedule and he should be hearing from [facility] about his cpap machine. Pt is aware and agreeable to this treatment.

## 2018-04-02 NOTE — Telephone Encounter (Signed)
Pt in office today for INR check and reports he has not heard anything about his CPAP machine yet, he would like a follow up call regarding this. Thanks!

## 2018-04-12 DIAGNOSIS — I4891 Unspecified atrial fibrillation: Secondary | ICD-10-CM | POA: Diagnosis not present

## 2018-04-12 DIAGNOSIS — R918 Other nonspecific abnormal finding of lung field: Secondary | ICD-10-CM | POA: Diagnosis not present

## 2018-04-12 DIAGNOSIS — I340 Nonrheumatic mitral (valve) insufficiency: Secondary | ICD-10-CM | POA: Diagnosis not present

## 2018-04-12 DIAGNOSIS — J9811 Atelectasis: Secondary | ICD-10-CM | POA: Diagnosis not present

## 2018-04-15 DIAGNOSIS — I340 Nonrheumatic mitral (valve) insufficiency: Secondary | ICD-10-CM | POA: Diagnosis not present

## 2018-04-16 ENCOUNTER — Other Ambulatory Visit (HOSPITAL_COMMUNITY): Payer: Self-pay

## 2018-04-16 ENCOUNTER — Ambulatory Visit (INDEPENDENT_AMBULATORY_CARE_PROVIDER_SITE_OTHER): Payer: Medicare Other

## 2018-04-16 DIAGNOSIS — Z952 Presence of prosthetic heart valve: Secondary | ICD-10-CM

## 2018-04-16 DIAGNOSIS — Z7189 Other specified counseling: Secondary | ICD-10-CM | POA: Diagnosis not present

## 2018-04-16 DIAGNOSIS — I4891 Unspecified atrial fibrillation: Secondary | ICD-10-CM | POA: Diagnosis not present

## 2018-04-16 LAB — POCT INR: INR: 2.1 (ref 2.0–3.0)

## 2018-04-16 MED ORDER — AMIODARONE HCL 200 MG PO TABS: 200.0000 mg | ORAL_TABLET | Freq: Every day | ORAL | 3 refills | Status: DC

## 2018-04-16 NOTE — Telephone Encounter (Signed)
Pt in the office today for INR check.   He reports the has has not heard anything yet about his CPAP and would like an update.  Thank you!

## 2018-04-16 NOTE — Patient Instructions (Signed)
Please take 1.5 tablets tonight, then resume taking 1 tablet daily except 1/2 tablet on Fridays.  Recheck INR in 2 weeks.  Call with any new medications or procedures [phone].

## 2018-04-18 NOTE — Telephone Encounter (Signed)
Reached out to the patient to assure him I will call [facility] and call him with an update asap. Pt is aware and agreeable to treatment.

## 2018-04-19 ENCOUNTER — Telehealth: Payer: Self-pay | Admitting: Pharmacist

## 2018-04-19 ENCOUNTER — Encounter (HOSPITAL_COMMUNITY): Payer: Self-pay | Admitting: *Deleted

## 2018-04-19 ENCOUNTER — Other Ambulatory Visit (HOSPITAL_COMMUNITY): Payer: Self-pay

## 2018-04-19 DIAGNOSIS — I50812 Chronic right heart failure: Secondary | ICD-10-CM

## 2018-04-19 DIAGNOSIS — I340 Nonrheumatic mitral (valve) insufficiency: Secondary | ICD-10-CM

## 2018-04-19 NOTE — Telephone Encounter (Signed)
Jazmin called from HF clinic to request that pt be scheduled for lovenox bridge prior to cath on 04/30/18. He takes warfarin for Afib with mechanical valve. Spoke with pt and rescheduled coumadin clinic for 6 days prior to procedure to go over lovenox instructions.

## 2018-04-23 ENCOUNTER — Ambulatory Visit (INDEPENDENT_AMBULATORY_CARE_PROVIDER_SITE_OTHER): Payer: Medicare Other

## 2018-04-23 DIAGNOSIS — I4891 Unspecified atrial fibrillation: Secondary | ICD-10-CM | POA: Diagnosis not present

## 2018-04-23 DIAGNOSIS — Z7189 Other specified counseling: Secondary | ICD-10-CM | POA: Diagnosis not present

## 2018-04-23 DIAGNOSIS — Z952 Presence of prosthetic heart valve: Secondary | ICD-10-CM

## 2018-04-23 LAB — POCT INR: INR: 1.8 — AB (ref 2.0–3.0)

## 2018-04-23 MED ORDER — ENOXAPARIN SODIUM 100 MG/ML ~~LOC~~ SOLN: 100.0000 mg | Freq: Two times a day (BID) | SUBCUTANEOUS | 1 refills | Status: DC

## 2018-04-23 NOTE — Addendum Note (Signed)
Addended by: Dede Query R on: 04/23/2018 11:40 AM   Modules accepted: Orders

## 2018-04-23 NOTE — Patient Instructions (Addendum)
Please take 2 tablets tonight, then follow instructions for Lovenox bridge. Recheck INR on 2/3.  If your INR is >2.0, we can stop the Lovenox at that time.   Wednesday, 1/22 : Last dose of Coumadin.  Thursday, 1/23:Last dose of Coumadin. No Coumadin or Lovenox.  Friday, 1/24: Inject Lovenox 100 mg in the fatty abdominal tissue at least 2 inches from the belly button twice a day about 12 hours apart, 8am and 8pm rotate sites. No Coumadin.  Saturday, 1/25: Inject Lovenox in the fatty tissue every 12 hours, 8am and 8pm. No Coumadin.   Sunday, 1/26: Inject Lovenox in the fatty tissue every 12 hours, 8am and 8pm. No Coumadin.  Monday, 1/27: Inject Lovenox in the fatty tissue in the morning at 8 am (No PM dose). No Coumadin.  Tuesday, 1/28: Procedure Day - No Lovenox - Resume Coumadin in the evening or as directed by doctor (take an extra half tablet with usual dose for 2 days then resume normal dose).  Wednesday, 1/29: Resume Lovenox inject in the fatty tissue every 12 hours and take Coumadin.  Thursday, 1/30: Inject Lovenox in the fatty tissue every 12 hours and take Coumadin.  Friday, 1/31: Inject Lovenox in the fatty tissue every 12 hours and take Coumadin.  Saturday, 2/1: Inject Lovenox in the fatty tissue every 12 hours and take Coumadin.  Sunday, 2/2: Inject Lovenox in the fatty tissue every 12 hours and take Coumadin.  Monday, 2/3: Coumadin appt to check INR.

## 2018-04-23 NOTE — Progress Notes (Signed)
ovenox

## 2018-04-26 ENCOUNTER — Other Ambulatory Visit (HOSPITAL_COMMUNITY): Payer: Self-pay | Admitting: Physical Medicine and Rehabilitation

## 2018-04-28 ENCOUNTER — Encounter (HOSPITAL_COMMUNITY): Payer: Self-pay | Admitting: *Deleted

## 2018-04-28 ENCOUNTER — Emergency Department (HOSPITAL_COMMUNITY)
Admission: EM | Admit: 2018-04-28 | Discharge: 2018-04-28 | Disposition: A | Discharge status: Home / Self Care | Payer: Medicare Other | Attending: Emergency Medicine | Admitting: Emergency Medicine

## 2018-04-28 ENCOUNTER — Telehealth: Payer: Self-pay | Admitting: Student

## 2018-04-28 ENCOUNTER — Ambulatory Visit (HOSPITAL_COMMUNITY)
Admission: EM | Admit: 2018-04-28 | Discharge: 2018-04-28 | Disposition: A | Discharge status: Nursing Home (Includes ICF) | Payer: Medicare Other | Source: Home / Self Care

## 2018-04-28 ENCOUNTER — Other Ambulatory Visit: Payer: Self-pay

## 2018-04-28 ENCOUNTER — Encounter (HOSPITAL_COMMUNITY): Payer: Self-pay

## 2018-04-28 DIAGNOSIS — R040 Epistaxis: Secondary | ICD-10-CM

## 2018-04-28 DIAGNOSIS — Z7901 Long term (current) use of anticoagulants: Secondary | ICD-10-CM | POA: Diagnosis not present

## 2018-04-28 DIAGNOSIS — Z951 Presence of aortocoronary bypass graft: Secondary | ICD-10-CM | POA: Diagnosis not present

## 2018-04-28 DIAGNOSIS — E119 Type 2 diabetes mellitus without complications: Secondary | ICD-10-CM | POA: Diagnosis not present

## 2018-04-28 DIAGNOSIS — I110 Hypertensive heart disease with heart failure: Secondary | ICD-10-CM | POA: Insufficient documentation

## 2018-04-28 DIAGNOSIS — I5032 Chronic diastolic (congestive) heart failure: Secondary | ICD-10-CM | POA: Insufficient documentation

## 2018-04-28 DIAGNOSIS — I2510 Atherosclerotic heart disease of native coronary artery without angina pectoris: Secondary | ICD-10-CM | POA: Diagnosis not present

## 2018-04-28 DIAGNOSIS — Z87891 Personal history of nicotine dependence: Secondary | ICD-10-CM | POA: Insufficient documentation

## 2018-04-28 DIAGNOSIS — Z79899 Other long term (current) drug therapy: Secondary | ICD-10-CM | POA: Insufficient documentation

## 2018-04-28 HISTORY — DX: Epistaxis: R04.0

## 2018-04-28 MED ORDER — CEPHALEXIN 500 MG PO CAPS: 500.0000 mg | ORAL_CAPSULE | Freq: Two times a day (BID) | ORAL | 0 refills | Status: AC

## 2018-04-28 MED ORDER — LIDOCAINE HCL URETHRAL/MUCOSAL 2 % EX GEL
1.0000 "application " | Freq: Once | CUTANEOUS | Status: AC
  Administered 2018-04-28: 1 via TOPICAL
  Filled 2018-04-28: qty 20

## 2018-04-28 MED ORDER — OXYMETAZOLINE HCL 0.05 % NA SOLN
1.0000 | Freq: Once | NASAL | Status: AC
  Administered 2018-04-28: 1 via NASAL
  Filled 2018-04-28: qty 15

## 2018-04-28 NOTE — ED Triage Notes (Signed)
EDP at bed side to place nasal compression,Lt nostril.

## 2018-04-28 NOTE — ED Provider Notes (Signed)
[facility] EMERGENCY DEPARTMENT Provider Note   CSN: [id number] Arrival date & time: 04/28/18  1210     History   Chief Complaint No chief complaint on file.   HPI [name] is a 83 y.o. male.  HPI  83 year old male with a history below, including history of coronary artery disease upcoming cardiac catheterization on Tuesday, currently transitioning from Coumadin to Lovenox, who presents with concern for epistaxis. Bleeding from both nostrils but worse from left. Patient reports that nosebleed began around 7 AM this morning.  Reports that he has been placing pressure, however does continue to bleed.  Reports that he was placing paper into his nose, and holding pressure, but would report checking in the area for continued bleeding, and switching out to see paper.  He also tried using Afrin.  Reports he had a similar nosebleed before that had improved with a Rhino Rocket.  He seen at urgent care prior to arrival to the emergency department.  Reports this happens every time he has Lovenox.  No other lightheadedness, fevers or other concerns.   Past Medical History:  Diagnosis Date  . Carotid artery disease ([place]) 1994   s/p left carotid endarerectomy   . Chronic atrial fibrillation    a. on coumadin   . Chronic diastolic CHF (congestive heart failure) ([place])   . Diabetes mellitus without complication ([place])    dx 2016  . Dyspnea   . Epistaxis   . Heart murmur   . Hx of CABG    a. 1994  . Hypertension   . OSA (obstructive sleep apnea)   . Rheumatic fever   . S/P AVR (aortic valve replacement)    a. mechanical valve 1996  . Subclavian bypass stenosis ([place])   . 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 Vitamin B12 deficiency     Patient Active Problem List   Diagnosis Date Noted  . Acute on chronic renal failure ([place]) 01/31/2018  . Debility 01/18/2018  . Anemia 01/09/2018  . Leg pain, anterior,  left 01/07/2018  . Dysuria 01/07/2018  . Severe anemia 12/23/2017  . Hyponatremia 12/23/2017  . Coronary artery disease with history of myocardial infarction without history of CABG 12/23/2017  . Bleeding from mouth 12/20/2017  . Post-op pain 12/12/2017  . Dental caries 12/12/2017  . Severe mitral regurgitation 11/14/2017  . Pressure injury of skin 10/29/2017  . Shortness of breath   . Palliative care by specialist   . RVF (right ventricular failure) ([place])   . Right heart failure ([place]) 10/18/2017  . Hypoxia   . COPD exacerbation ([place]) 08/01/2017  . Pancytopenia ([place]) 08/01/2017  . Anticoagulation goal of INR 1.5 to 2.5 07/16/2017  . Epistaxis 07/11/2017  . Actinic keratoses 04/05/2017  . Leg wound, right 02/18/2016  . Peripheral edema 02/18/2016  . Chronic anticoagulation 02/18/2016  . Hyperkalemia 07/25/2015  . Atherosclerosis of native arteries of extremity with intermittent claudication ([place]) 10/21/2014  . Thrombocytopenia ([place]) 10/20/2014  . Macrocytosis without anemia 10/20/2014  . OSA (obstructive sleep apnea) 05/15/2013  . Goals of care, counseling/discussion 05/01/2013  . History of mechanical aortic valve replacement 01/02/2013  . Obesity (BMI 30-39.9) 10/20/2012  . Type II diabetes mellitus with complication ([place]) 37/34/2876  . Acute asthmatic bronchitis 01/18/2012  . ACUTE KIDNEY FAILURE UNSPECIFIED 02/04/2009  . CUTANEOUS ERUPTIONS, DRUG-INDUCED 02/04/2009  . HLD (hyperlipidemia) 02/03/2009  . Essential hypertension 02/03/2009  . Coronary atherosclerosis 02/03/2009  .  Atrial fibrillation ([place]) 02/03/2009  . Chronic diastolic heart failure ([place]) 02/03/2009  . CAROTID ENDARTERECTOMY, LEFT, HX OF 02/03/2009  . INF&INFLAM REACT DUE CARD DEVICE IMPLANT&GRAFT 12/30/2008    Past Surgical History:  Procedure Laterality Date  . artificial valve    . [place]   replaced due to aortic stenosis, [employer] mechanical prostesis  . CAROTID  ENDARTERECTOMY Left 1997   subclavian bypass Done in [place]  . CORONARY ARTERY BYPASS GRAFT  1994   w SVG to RCA and SVG to circumflex  . heart bypass     Done in [place]  . MULTIPLE EXTRACTIONS WITH ALVEOLOPLASTY Bilateral 12/12/2017   Procedure: Extraction of tooth #'s 1, 12,13,14,15,17,18,19, 29, and 30 with alveoloplasty and gross debridement of remaining teeth`;  Surgeon: Lenn Cal, DDS;  Location: [facility];  Service: Oral Surgery;  Laterality: Bilateral;  MULTIPLE EXTRACTION WITH ALVEOLOPLASTY WITH PRE PROSTHETIC SURGERY AND GROSS DEBRIDEMENT OF REMAINING TEETH  . TEE WITHOUT CARDIOVERSION N/A 10/23/2017   Procedure: TRANSESOPHAGEAL ECHOCARDIOGRAM (TEE);  Surgeon: Larey Dresser, MD;  Location: [facility] ENDOSCOPY;  Service: Cardiovascular;  Laterality: N/A;  . TONSILLECTOMY          Home Medications    Prior to Admission medications   Medication Sig Start Date End Date Taking? Authorizing Provider  acetaminophen (TYLENOL) 500 MG tablet Take 1,000 mg by mouth every 6 (six) hours as needed for moderate pain or headache.   Yes [provider]  allopurinol (ZYLOPRIM) 100 MG tablet Take 1 tablet (100 mg total) by mouth daily. 12/05/17  Yes Georgiana Shore, NP  amiodarone (PACERONE) 200 MG tablet Take 1 tablet (200 mg total) by mouth daily. 04/16/18  Yes Larey Dresser, MD  enoxaparin (LOVENOX) 100 MG/ML injection Inject 1 mL (100 mg total) into the skin every 12 (twelve) hours. 04/23/18  Yes Camnitz, Will Hassell Done, MD  ezetimibe-simvastatin (VYTORIN) 10-20 MG tablet Take 1 tablet by mouth at bedtime. 04/05/17  Yes [name], [name], MD  metolazone (ZAROXOLYN) 2.5 MG tablet Take 1 tablet (2.5 mg total) by mouth every Monday. 12/31/17  Yes Elgergawy, Silver Huguenin, MD  polyethylene glycol powder (GLYCOLAX/MIRALAX) powder Take 17 g by mouth daily as needed for moderate constipation.   Yes [provider]  potassium chloride SA (K-DUR,KLOR-CON) 20 MEQ tablet Take 40-60 mEq by  mouth See admin instructions. Take 60 meq by mouth in the morning, take 40 meq by mouth at noon and take 40 meq by mouth in the evening   Yes [provider]  spironolactone (ALDACTONE) 25 MG tablet Take 0.5 tablets (12.5 mg total) by mouth daily. Patient taking differently: Take 12.5 mg by mouth at bedtime.  03/21/18 06/19/18 Yes Georgiana Shore, NP  torsemide (DEMADEX) 100 MG tablet TAKE 1 TABLET BY MOUTH TWICE A DAY Patient taking differently: Take 100 mg by mouth 2 (two) times daily.  04/26/18  Yes Meredith Staggers, MD  cephALEXin (KEFLEX) 500 MG capsule Take 1 capsule (500 mg total) by mouth 2 (two) times daily for 5 days. 04/28/18 05/03/18  Gareth Morgan, MD  warfarin (COUMADIN) 5 MG tablet One pill daily on Tue, Thurs, Sat, Sun Patient taking differently: Take 5-7.5 mg by mouth See admin instructions. Take 2.5 mg by mouth daily on Friday. Take 5 mg by mouth daily on all other days. 01/31/18   Bary Leriche, PA-C    Family History Family History  Problem Relation Age of Onset  . Cancer Father  lung   . Hypertension Mother     Social History Social History   Tobacco Use  . Smoking status: Former Smoker    Packs/day: 1.00    Years: 30.00    Pack years: 30.00    Types: Cigarettes    Last attempt to quit: 04/03/1992    Years since quitting: 26.0  . Smokeless tobacco: Never Used  Substance Use Topics  . Alcohol use: Yes    Comment: 4-6 ounces of wine daily  . Drug use: No    Comment: Half a cup a day.     Allergies   Rifampin and Vancomycin   Review of Systems Review of Systems  Constitutional: Negative for fever.  HENT: Positive for nosebleeds.   Eyes: Negative for visual disturbance.  Respiratory: Negative for shortness of breath.   Cardiovascular: Negative for chest pain.  Gastrointestinal: Negative for abdominal pain.  Genitourinary: Negative for difficulty urinating.  Musculoskeletal: Negative for back pain and neck stiffness.  Skin: Negative  for rash and wound.  Neurological: Negative for syncope.     Physical Exam Updated Vital Signs BP (!) 119/57   Pulse 76   Temp 98.2 F (36.8 C) (Oral)   Resp 13   Ht 6\' 2"  (1.88 m)   Wt 93.9 kg   SpO2 97%   BMI 26.58 kg/m   Physical Exam Vitals signs and nursing note reviewed.  Constitutional:      General: He is not in acute distress.    Appearance: He is well-developed. He is not diaphoretic.  HENT:     Head: Normocephalic and atraumatic.     Nose:     Comments: Blood noted in pharynx, active bleeding from left nares,  Eyes:     Conjunctiva/sclera: Conjunctivae normal.  Neck:     Musculoskeletal: Normal range of motion.  Cardiovascular:     Rate and Rhythm: Normal rate and regular rhythm.     Heart sounds: Normal heart sounds. No murmur. No friction rub. No gallop.   Pulmonary:     Effort: Pulmonary effort is normal. No respiratory distress.     Breath sounds: Normal breath sounds.  Abdominal:     General: There is no distension.     Palpations: Abdomen is soft.  Skin:    General: Skin is warm and dry.  Neurological:     Mental Status: He is alert and oriented to person, place, and time.      ED Treatments / Results  Labs (all labs ordered are listed, but only abnormal results are displayed) Labs Reviewed - No data to display  EKG None  Radiology No results found.  Procedures .Epistaxis Management Date/Time: 04/28/2018 11:47 PM Performed by: Gareth Morgan, MD Authorized by: Gareth Morgan, MD   Consent:    Consent obtained:  Verbal   Consent given by:  Patient   Risks discussed:  Bleeding and infection   Alternatives discussed:  No treatment Anesthesia (see MAR for exact dosages):    Anesthesia method:  Topical application   Topical anesthetic:  Lidocaine gel Procedure details:    Treatment site:  L anterior and L posterior   Treatment method:  Nasal balloon Post-procedure details:    Assessment:  Bleeding stopped   Patient tolerance  of procedure:  Tolerated well, no immediate complications   (including critical care time)  Medications Ordered in ED Medications  oxymetazoline (AFRIN) 0.05 % nasal spray 1 spray (1 spray Each Nare Given 04/28/18 1230)  lidocaine (XYLOCAINE) 2 % jelly  1 application (1 application Topical Given 04/28/18 1409)     Initial Impression / Assessment and Plan / ED Course  I have reviewed the triage vital signs and the nursing notes.  Pertinent labs & imaging results that were available during my care of the patient were reviewed by me and considered in my medical decision making (see chart for details).     83 year old male with a history below, including history of coronary artery disease upcoming cardiac catheterization on Tuesday, currently transitioning from Coumadin to Lovenox, who presents with concern for epistaxis.  Given aspirin, and pressure place, without resolution of bleeding.  Placed left-sided [employer] with improvement of bleeding.  Given prophylactic antibiotics. Recommend follow up with ENT for removal in 2-3 days.  Final Clinical Impressions(s) / ED Diagnoses   Final diagnoses:  Epistaxis    ED Discharge Orders         Ordered    cephALEXin (KEFLEX) 500 MG capsule  2 times daily     04/28/18 1552           Gareth Morgan, MD 04/29/18 2235

## 2018-04-28 NOTE — ED Provider Notes (Signed)
04/28/2018 12:00 PM   DOB: 06-03-1935 / MRN: [id number]  SUBJECTIVE:  [name] is a 83 y.o. male presenting for bilateral nosebleed.  Patient recently stopped Coumadin and started on Lovenox for an upcoming cardiac cath on Tuesday.  Reports he was reading the paper this morning all of a sudden his nose began to bleed fairly vigorously.  He is tried pressure along with Afrin in his nose continues to bleed.  He is allergic to rifampin and vancomycin.   He  has a past medical history of Carotid artery disease ([place]) (1994), Chronic atrial fibrillation, Chronic diastolic CHF (congestive heart failure) ([place]), Diabetes mellitus without complication ([place]), Dyspnea, Epistaxis, Heart murmur, CABG, Hypertension, OSA (obstructive sleep apnea), Rheumatic fever, S/P AVR (aortic valve replacement), Subclavian bypass stenosis (HCC), Temporary low platelet count ([place]), and Vitamin B12 deficiency.    He  reports that he quit smoking about 26 years ago. His smoking use included cigarettes. He has a 30.00 pack-year smoking history. He has never used smokeless tobacco. He reports current alcohol use. He reports that he does not use drugs. He  has no history on file for sexual activity. The patient  has a past surgical history that includes heart bypass; Cardiac valuve replacement (1994); Coronary artery bypass graft (1994); Carotid endarterectomy (Left, 1997); TEE without cardioversion (N/A, 10/23/2017); Tonsillectomy; Multiple extractions with alveoloplasty (Bilateral, 12/12/2017); and artificial valve.  His family history includes Cancer in his father; Hypertension in his mother.  Review of Systems  Constitutional: Negative for chills, diaphoresis and fever.  Gastrointestinal: Negative for nausea.  Skin: Negative for rash.  Neurological: Negative for dizziness.  Endo/Heme/Allergies: Bruises/bleeds easily.    OBJECTIVE:  BP 135/61   Pulse 78   Temp 97.9 F (36.6 C) (Temporal)   Resp 18   SpO2 96%   Wt  Readings from Last 3 Encounters:  03/26/18 208 lb (94.3 kg)  03/21/18 207 lb 12.8 oz (94.3 kg)  03/06/18 218 lb 6.4 oz (99.1 kg)   Temp Readings from Last 3 Encounters:  04/28/18 97.9 F (36.6 C) (Temporal)  03/26/18 98.2 F (36.8 C) (Oral)  01/31/18 98.1 F (36.7 C)   BP Readings from Last 3 Encounters:  04/28/18 135/61  03/26/18 116/62  03/21/18 110/62   Pulse Readings from Last 3 Encounters:  04/28/18 78  03/26/18 71  03/21/18 76    Physical Exam Vitals signs and nursing note reviewed.  Constitutional:      Appearance: He is well-developed. He is not ill-appearing or diaphoretic.  HENT:     Head:   Eyes:     Conjunctiva/sclera: Conjunctivae normal.     Pupils: Pupils are equal, round, and reactive to light.  Cardiovascular:     Rate and Rhythm: Normal rate.  Pulmonary:     Effort: Pulmonary effort is normal.  Abdominal:     General: There is no distension.  Musculoskeletal: Normal range of motion.  Skin:    General: Skin is warm and dry.  Neurological:     Mental Status: He is alert and oriented to person, place, and time.     Cranial Nerves: No cranial nerve deficit.     Coordination: Coordination normal.     No results found for this or any previous visit (from the past 72 hour(s)).  No results found.  ASSESSMENT AND PLAN:   Epistaxis - Difficult to know if this was a posterior bleed.  He has bleeding from both naris which certainly would suggest that there may be a posterior  source.  We have asked that he go down to the ED for lab work and potential ENT consult.  Discharge Instructions   None        The patient is advised to call or return to clinic if he does not see an improvement in symptoms, or to seek the care of the closest emergency department if he worsens with the above plan.   Philis Fendt, MHS, PA-C 04/28/2018 12:00 PM   Tereasa Coop, PA-C 04/28/18 1200

## 2018-04-28 NOTE — Telephone Encounter (Signed)
    Patient's wife called the after-hours line reporting he is having nosebleeds with Lovenox injections. No associated hematemesis, melena, hematochezia, or hematuria. He had issues with epistaxis in the past. I encouraged her to use conservative measures initially including saline spray and packing the area and leaving this in place for 30+ minutes. If symptoms do not improve, she plans to take him to urgent care later today for further evaluation.  Was informed to remain on Lovenox injections at this time as his Coumadin is currently held in anticipation of upcoming cardiac catheterization on 04/30/2018 and he has a mechanical valve.  She voiced understanding of this and was appreciative of the return call.  Signed, Erma Heritage, PA-C 04/28/2018, 8:53 AM Pager: [phone]

## 2018-04-28 NOTE — ED Notes (Signed)
Assisted pt per MD to rinse and gargle with saline. Pt's nose began to bleed again during this activity. [place] provided to pt.

## 2018-04-28 NOTE — ED Triage Notes (Signed)
Pt recently switched from Coumadin to Lovenox. States that epitaxis occurs when he switches to Lovenox. Nose bleeding since 0730

## 2018-04-28 NOTE — ED Triage Notes (Signed)
Per pt, recently taken off Coumadin and placed on Lovenox.  Started with epistaxis this AM; unable to stop bleeding.  Active epistaxis noted.  Discussed with providers.

## 2018-04-30 ENCOUNTER — Other Ambulatory Visit: Payer: Self-pay

## 2018-04-30 ENCOUNTER — Ambulatory Visit (HOSPITAL_COMMUNITY)
Admission: RE | Admit: 2018-04-30 | Discharge: 2018-04-30 | Disposition: A | Discharge status: Home / Self Care | Payer: Medicare Other | Attending: Cardiology | Admitting: Cardiology

## 2018-04-30 ENCOUNTER — Ambulatory Visit (HOSPITAL_BASED_OUTPATIENT_CLINIC_OR_DEPARTMENT_OTHER)
Admission: RE | Admit: 2018-04-30 | Discharge: 2018-04-30 | Disposition: A | Discharge status: Home / Self Care | Payer: Medicare Other | Source: Ambulatory Visit

## 2018-04-30 ENCOUNTER — Other Ambulatory Visit (HOSPITAL_COMMUNITY): Payer: Self-pay

## 2018-04-30 ENCOUNTER — Encounter (HOSPITAL_COMMUNITY)
Admission: RE | Disposition: A | Discharge status: Home / Self Care | Payer: Self-pay | Source: Home / Self Care | Attending: Cardiology

## 2018-04-30 DIAGNOSIS — Z952 Presence of prosthetic heart valve: Secondary | ICD-10-CM | POA: Insufficient documentation

## 2018-04-30 DIAGNOSIS — I50812 Chronic right heart failure: Secondary | ICD-10-CM

## 2018-04-30 DIAGNOSIS — Z8249 Family history of ischemic heart disease and other diseases of the circulatory system: Secondary | ICD-10-CM | POA: Insufficient documentation

## 2018-04-30 DIAGNOSIS — E119 Type 2 diabetes mellitus without complications: Secondary | ICD-10-CM | POA: Insufficient documentation

## 2018-04-30 DIAGNOSIS — Z79899 Other long term (current) drug therapy: Secondary | ICD-10-CM | POA: Insufficient documentation

## 2018-04-30 DIAGNOSIS — I110 Hypertensive heart disease with heart failure: Secondary | ICD-10-CM | POA: Insufficient documentation

## 2018-04-30 DIAGNOSIS — G4733 Obstructive sleep apnea (adult) (pediatric): Secondary | ICD-10-CM | POA: Insufficient documentation

## 2018-04-30 DIAGNOSIS — I058 Other rheumatic mitral valve diseases: Secondary | ICD-10-CM | POA: Diagnosis not present

## 2018-04-30 DIAGNOSIS — I2510 Atherosclerotic heart disease of native coronary artery without angina pectoris: Secondary | ICD-10-CM | POA: Diagnosis not present

## 2018-04-30 DIAGNOSIS — Z951 Presence of aortocoronary bypass graft: Secondary | ICD-10-CM | POA: Diagnosis not present

## 2018-04-30 DIAGNOSIS — Z87891 Personal history of nicotine dependence: Secondary | ICD-10-CM | POA: Insufficient documentation

## 2018-04-30 DIAGNOSIS — I5032 Chronic diastolic (congestive) heart failure: Secondary | ICD-10-CM | POA: Diagnosis not present

## 2018-04-30 DIAGNOSIS — I272 Pulmonary hypertension, unspecified: Secondary | ICD-10-CM | POA: Insufficient documentation

## 2018-04-30 DIAGNOSIS — I340 Nonrheumatic mitral (valve) insufficiency: Secondary | ICD-10-CM | POA: Diagnosis not present

## 2018-04-30 DIAGNOSIS — I482 Chronic atrial fibrillation, unspecified: Secondary | ICD-10-CM | POA: Insufficient documentation

## 2018-04-30 DIAGNOSIS — Z881 Allergy status to other antibiotic agents status: Secondary | ICD-10-CM | POA: Diagnosis not present

## 2018-04-30 DIAGNOSIS — Z7901 Long term (current) use of anticoagulants: Secondary | ICD-10-CM | POA: Insufficient documentation

## 2018-04-30 HISTORY — PX: RIGHT HEART CATH AND CORONARY/GRAFT ANGIOGRAPHY: CATH118265

## 2018-04-30 LAB — CBC
HCT: 37.5 % — ABNORMAL LOW (ref 39.0–52.0)
Hemoglobin: 11.9 g/dL — ABNORMAL LOW (ref 13.0–17.0)
MCH: 33.7 pg (ref 26.0–34.0)
MCHC: 31.7 g/dL (ref 30.0–36.0)
MCV: 106.2 fL — ABNORMAL HIGH (ref 80.0–100.0)
NRBC: 0 % (ref 0.0–0.2)
Platelets: 112 10*3/uL — ABNORMAL LOW (ref 150–400)
RBC: 3.53 MIL/uL — AB (ref 4.22–5.81)
RDW: 19.2 % — ABNORMAL HIGH (ref 11.5–15.5)
WBC: 4.8 10*3/uL (ref 4.0–10.5)

## 2018-04-30 LAB — POCT I-STAT EG7
Acid-Base Excess: 1 mmol/L (ref 0.0–2.0)
Acid-Base Excess: 2 mmol/L (ref 0.0–2.0)
Bicarbonate: 25.8 mmol/L (ref 20.0–28.0)
Bicarbonate: 27 mmol/L (ref 20.0–28.0)
CALCIUM ION: 1.22 mmol/L (ref 1.15–1.40)
Calcium, Ion: 1.26 mmol/L (ref 1.15–1.40)
HCT: 32 % — ABNORMAL LOW (ref 39.0–52.0)
HCT: 33 % — ABNORMAL LOW (ref 39.0–52.0)
Hemoglobin: 10.9 g/dL — ABNORMAL LOW (ref 13.0–17.0)
Hemoglobin: 11.2 g/dL — ABNORMAL LOW (ref 13.0–17.0)
O2 Saturation: 76 %
O2 Saturation: 76 %
Potassium: 4.6 mmol/L (ref 3.5–5.1)
Potassium: 4.7 mmol/L (ref 3.5–5.1)
SODIUM: 137 mmol/L (ref 135–145)
Sodium: 138 mmol/L (ref 135–145)
TCO2: 27 mmol/L (ref 22–32)
TCO2: 28 mmol/L (ref 22–32)
pCO2, Ven: 41.8 mmHg — ABNORMAL LOW (ref 44.0–60.0)
pCO2, Ven: 43 mmHg — ABNORMAL LOW (ref 44.0–60.0)
pH, Ven: 7.397 (ref 7.250–7.430)
pH, Ven: 7.405 (ref 7.250–7.430)
pO2, Ven: 41 mmHg (ref 32.0–45.0)
pO2, Ven: 41 mmHg (ref 32.0–45.0)

## 2018-04-30 LAB — POCT ACTIVATED CLOTTING TIME
Activated Clotting Time: 153 seconds
Activated Clotting Time: 153 seconds

## 2018-04-30 LAB — BASIC METABOLIC PANEL
Anion gap: 10 (ref 5–15)
BUN: 43 mg/dL — AB (ref 8–23)
CO2: 26 mmol/L (ref 22–32)
Calcium: 9.8 mg/dL (ref 8.9–10.3)
Chloride: 100 mmol/L (ref 98–111)
Creatinine, Ser: 1.38 mg/dL — ABNORMAL HIGH (ref 0.61–1.24)
GFR calc Af Amer: 55 mL/min — ABNORMAL LOW (ref >60)
GFR calc non Af Amer: 48 mL/min — ABNORMAL LOW (ref >60)
Glucose, Bld: 118 mg/dL — ABNORMAL HIGH (ref 70–99)
POTASSIUM: 4.5 mmol/L (ref 3.5–5.1)
Sodium: 136 mmol/L (ref 135–145)

## 2018-04-30 LAB — GLUCOSE, CAPILLARY
Glucose-Capillary: 106 mg/dL — ABNORMAL HIGH (ref 70–99)
Glucose-Capillary: 102 mg/dL — ABNORMAL HIGH (ref 70–99)

## 2018-04-30 LAB — PROTIME-INR
INR: 1.15
Prothrombin Time: 14.6 seconds (ref 11.4–15.2)

## 2018-04-30 SURGERY — RIGHT HEART CATH AND CORONARY/GRAFT ANGIOGRAPHY
Anesthesia: LOCAL

## 2018-04-30 MED ORDER — WARFARIN SODIUM 5 MG PO TABS: ORAL_TABLET | ORAL | 0 refills | Status: DC

## 2018-04-30 MED ORDER — LIDOCAINE HCL (PF) 1 % IJ SOLN
INTRAMUSCULAR | Status: DC | PRN
  Administered 2018-04-30 (×3): 2 mL

## 2018-04-30 MED ORDER — SODIUM CHLORIDE 0.9% FLUSH: 3.0000 mL | INTRAVENOUS | Status: DC | PRN

## 2018-04-30 MED ORDER — FENTANYL CITRATE (PF) 100 MCG/2ML IJ SOLN
INTRAMUSCULAR | Status: AC
  Filled 2018-04-30: qty 2

## 2018-04-30 MED ORDER — SODIUM CHLORIDE 0.9 % WEIGHT BASED INFUSION: 1.0000 mL/kg/h | INTRAVENOUS | Status: DC

## 2018-04-30 MED ORDER — VERAPAMIL HCL 2.5 MG/ML IV SOLN
INTRAVENOUS | Status: AC
  Filled 2018-04-30: qty 2

## 2018-04-30 MED ORDER — HEPARIN SODIUM (PORCINE) 1000 UNIT/ML IJ SOLN
INTRAMUSCULAR | Status: AC
  Filled 2018-04-30: qty 1

## 2018-04-30 MED ORDER — HEPARIN (PORCINE) IN NACL 1000-0.9 UT/500ML-% IV SOLN
INTRAVENOUS | Status: AC
  Filled 2018-04-30: qty 500

## 2018-04-30 MED ORDER — MIDAZOLAM HCL 2 MG/2ML IJ SOLN
INTRAMUSCULAR | Status: AC
  Filled 2018-04-30: qty 2

## 2018-04-30 MED ORDER — VERAPAMIL HCL 2.5 MG/ML IV SOLN
INTRAVENOUS | Status: DC | PRN
  Administered 2018-04-30: 10 mL via INTRA_ARTERIAL

## 2018-04-30 MED ORDER — ONDANSETRON HCL 4 MG/2ML IJ SOLN: 4.0000 mg | Freq: Four times a day (QID) | INTRAMUSCULAR | Status: DC | PRN

## 2018-04-30 MED ORDER — SODIUM CHLORIDE 0.9 % IV SOLN: 250.0000 mL | INTRAVENOUS | Status: DC | PRN

## 2018-04-30 MED ORDER — ENOXAPARIN SODIUM 100 MG/ML ~~LOC~~ SOLN
100.0000 mg | Freq: Two times a day (BID) | SUBCUTANEOUS | Status: DC
  Filled 2018-04-30: qty 1

## 2018-04-30 MED ORDER — ACETAMINOPHEN 325 MG PO TABS: 650.0000 mg | ORAL_TABLET | ORAL | Status: DC | PRN

## 2018-04-30 MED ORDER — IOHEXOL 350 MG/ML SOLN
INTRAVENOUS | Status: DC | PRN
  Administered 2018-04-30: 105 mL via INTRA_ARTERIAL

## 2018-04-30 MED ORDER — SODIUM CHLORIDE 0.9% FLUSH: 3.0000 mL | Freq: Two times a day (BID) | INTRAVENOUS | Status: DC

## 2018-04-30 MED ORDER — MIDAZOLAM HCL 2 MG/2ML IJ SOLN
INTRAMUSCULAR | Status: DC | PRN
  Administered 2018-04-30: 1 mg via INTRAVENOUS

## 2018-04-30 MED ORDER — LIDOCAINE HCL (PF) 1 % IJ SOLN
INTRAMUSCULAR | Status: AC
  Filled 2018-04-30: qty 30

## 2018-04-30 MED ORDER — ASPIRIN 81 MG PO CHEW
81.0000 mg | CHEWABLE_TABLET | ORAL | Status: AC
  Administered 2018-04-30: 81 mg via ORAL
  Filled 2018-04-30: qty 1

## 2018-04-30 MED ORDER — HEPARIN (PORCINE) IN NACL 1000-0.9 UT/500ML-% IV SOLN
INTRAVENOUS | Status: DC | PRN
  Administered 2018-04-30 (×2): 500 mL

## 2018-04-30 MED ORDER — FENTANYL CITRATE (PF) 100 MCG/2ML IJ SOLN
INTRAMUSCULAR | Status: DC | PRN
  Administered 2018-04-30: 25 ug via INTRAVENOUS

## 2018-04-30 MED ORDER — SODIUM CHLORIDE 0.9 % WEIGHT BASED INFUSION
3.0000 mL/kg/h | INTRAVENOUS | Status: AC
  Administered 2018-04-30: 3 mL/kg/h via INTRAVENOUS

## 2018-04-30 MED ORDER — HEPARIN SODIUM (PORCINE) 1000 UNIT/ML IJ SOLN
INTRAMUSCULAR | Status: DC | PRN
  Administered 2018-04-30: 4500 [IU] via INTRAVENOUS

## 2018-04-30 SURGICAL SUPPLY — 13 items
CATH 5FR JL3.5 JR4 ANG PIG MP (CATHETERS) ×2 IMPLANT
CATH BALLN WEDGE 5F 110CM (CATHETERS) ×2 IMPLANT
DEVICE RAD COMP TR BAND LRG (VASCULAR PRODUCTS) ×2 IMPLANT
GLIDESHEATH SLEND SS 6F .021 (SHEATH) ×2 IMPLANT
GUIDEWIRE .025 260CM (WIRE) ×2 IMPLANT
GUIDEWIRE INQWIRE 1.5J.035X260 (WIRE) ×1 IMPLANT
INQWIRE 1.5J .035X260CM (WIRE) ×2
KIT HEART LEFT (KITS) ×2 IMPLANT
PACK CARDIAC CATHETERIZATION (CUSTOM PROCEDURE TRAY) ×2 IMPLANT
SHEATH GLIDE SLENDER 4/5FR (SHEATH) ×2 IMPLANT
SHEATH PROBE COVER 6X72 (BAG) ×2 IMPLANT
TRANSDUCER W/STOPCOCK (MISCELLANEOUS) ×2 IMPLANT
TUBING CIL FLEX 10 FLL-RA (TUBING) ×2 IMPLANT

## 2018-04-30 NOTE — Discharge Instructions (Signed)
Radial Site Care ° °This sheet gives you information about how to care for yourself after your procedure. Your health care provider may also give you more specific instructions. If you have problems or questions, contact your health care provider. °What can I expect after the procedure? °After the procedure, it is common to have: °· Bruising and tenderness at the catheter insertion area. °Follow these instructions at home: °Medicines °· Take over-the-counter and prescription medicines only as told by your health care provider. °Insertion site care °· Follow instructions from your health care provider about how to take care of your insertion site. Make sure you: °? Wash your hands with soap and water before you change your bandage (dressing). If soap and water are not available, use hand sanitizer. °? Change your dressing as told by your health care provider. °? Leave stitches (sutures), skin glue, or adhesive strips in place. These skin closures may need to stay in place for 2 weeks or longer. If adhesive strip edges start to loosen and curl up, you may trim the loose edges. Do not remove adhesive strips completely unless your health care provider tells you to do that. °· Check your insertion site every day for signs of infection. Check for: °? Redness, swelling, or pain. °? Fluid or blood. °? Pus or a bad smell. °? Warmth. °· Do not take baths, swim, or use a hot tub until your health care provider approves. °· You may shower 24-48 hours after the procedure, or as directed by your health care provider. °? Remove the dressing and gently wash the site with plain soap and water. °? Pat the area dry with a clean towel. °? Do not rub the site. That could cause bleeding. °· Do not apply powder or lotion to the site. °Activity ° °· For 24 hours after the procedure, or as directed by your health care provider: °? Do not flex or bend the affected arm. °? Do not push or pull heavy objects with the affected arm. °? Do not  drive yourself home from the hospital or clinic. You may drive 24 hours after the procedure unless your health care provider tells you not to. °? Do not operate machinery or power tools. °· Do not lift anything that is heavier than 10 lb (4.5 kg), or the limit that you are told, until your health care provider says that it is safe. °· Ask your health care provider when it is okay to: °? Return to work or school. °? Resume usual physical activities or sports. °? Resume sexual activity. °General instructions °· If the catheter site starts to bleed, raise your arm and put firm pressure on the site. If the bleeding does not stop, get help right away. This is a medical emergency. °· If you went home on the same day as your procedure, a responsible adult should be with you for the first 24 hours after you arrive home. °· Keep all follow-up visits as told by your health care provider. This is important. °Contact a health care provider if: °· You have a fever. °· You have redness, swelling, or yellow drainage around your insertion site. °Get help right away if: °· You have unusual pain at the radial site. °· The catheter insertion area swells very fast. °· The insertion area is bleeding, and the bleeding does not stop when you hold steady pressure on the area. °· Your arm or hand becomes pale, cool, tingly, or numb. °These symptoms may represent a serious problem   that is an emergency. Do not wait to see if the symptoms will go away. Get medical help right away. Call your local emergency services (911 in the [place] Do not drive yourself to the hospital. °Summary °· After the procedure, it is common to have bruising and tenderness at the site. °· Follow instructions from your health care provider about how to take care of your radial site wound. Check the wound every day for signs of infection. °· Do not lift anything that is heavier than 10 lb (4.5 kg), or the limit that you are told, until your health care provider says  that it is safe. °This information is not intended to replace advice given to you by your health care provider. Make sure you discuss any questions you have with your health care provider. °Document Released: 04/22/2010 Document Revised: 04/25/2017 Document Reviewed: 04/25/2017 °Elsevier Interactive Patient Education © 2019 [employer] ° °

## 2018-04-30 NOTE — H&P (Signed)
Advanced Heart Failure Team History and Physical Note   PCP:  [name], Andrew Lacks, MD  PCP-Cardiology: [name], MD       HPI:   Patient presents for RHC/LHC prior to percutaneous mitral valve repair at [facility] clinic.   Review of Systems: All systems reviewed and negative except as per HPI.   Home Medications Prior to Admission medications   Medication Sig Start Date End Date Taking? Authorizing Provider  acetaminophen (TYLENOL) 500 MG tablet Take 1,000 mg by mouth every 6 (six) hours as needed for moderate pain or headache.   Yes [provider]  allopurinol (ZYLOPRIM) 100 MG tablet Take 1 tablet (100 mg total) by mouth daily. 12/05/17  Yes Georgiana Shore, NP  amiodarone (PACERONE) 200 MG tablet Take 1 tablet (200 mg total) by mouth daily. 04/16/18  Yes Larey Dresser, MD  enoxaparin (LOVENOX) 100 MG/ML injection Inject 1 mL (100 mg total) into the skin every 12 (twelve) hours. 04/23/18  Yes Camnitz, Will Hassell Done, MD  ezetimibe-simvastatin (VYTORIN) 10-20 MG tablet Take 1 tablet by mouth at bedtime. 04/05/17  Yes [name], Andrew Lacks, MD  polyethylene glycol powder (GLYCOLAX/MIRALAX) powder Take 17 g by mouth daily as needed for moderate constipation.   Yes [provider]  potassium chloride SA (K-DUR,KLOR-CON) 20 MEQ tablet Take 40-60 mEq by mouth See admin instructions. Take 60 meq by mouth in the morning, take 40 meq by mouth at noon and take 40 meq by mouth in the evening   Yes [provider]  spironolactone (ALDACTONE) 25 MG tablet Take 0.5 tablets (12.5 mg total) by mouth daily. Patient taking differently: Take 12.5 mg by mouth at bedtime.  03/21/18 06/19/18 Yes Georgiana Shore, NP  torsemide (DEMADEX) 100 MG tablet TAKE 1 TABLET BY MOUTH TWICE A DAY Patient taking differently: Take 100 mg by mouth 2 (two) times daily.  04/26/18  Yes Meredith Staggers, MD  warfarin (COUMADIN) 5 MG tablet One pill daily on Tue, Thurs, Sat, Sun Patient taking  differently: Take 5-7.5 mg by mouth See admin instructions. Take 2.5 mg by mouth daily on Friday. Take 5 mg by mouth daily on all other days. 01/31/18  Yes Love, Ivan Anchors, PA-C  cephALEXin (KEFLEX) 500 MG capsule Take 1 capsule (500 mg total) by mouth 2 (two) times daily for 5 days. 04/28/18 05/03/18  Gareth Morgan, MD  metolazone (ZAROXOLYN) 2.5 MG tablet Take 1 tablet (2.5 mg total) by mouth every Monday. 12/31/17   Elgergawy, Silver Huguenin, MD    Past Medical History: Past Medical History:  Diagnosis Date  . Carotid artery disease ([place]) 1994   s/p left carotid endarerectomy   . Chronic atrial fibrillation    a. on coumadin   . Chronic diastolic CHF (congestive heart failure) ([place])   . Diabetes mellitus without complication ([place])    dx 2016  . Dyspnea   . Epistaxis   . Heart murmur   . Hx of CABG    a. 1994  . Hypertension   . OSA (obstructive sleep apnea)   . Rheumatic fever   . S/P AVR (aortic valve replacement)    a. mechanical valve 1996  . Subclavian bypass stenosis ([place])   . 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 Vitamin B12 deficiency     Past Surgical History: Past Surgical History:  Procedure Laterality Date  . artificial valve    . CARDIAC VALVE  SURGERY  1994   replaced due to aortic stenosis, St. Jude mechanical prostesis  . CAROTID ENDARTERECTOMY Left 1997   subclavian bypass Done in [place]  . CORONARY ARTERY BYPASS GRAFT  1994   w SVG to RCA and SVG to circumflex  . heart bypass     Done in [place]  . MULTIPLE EXTRACTIONS WITH ALVEOLOPLASTY Bilateral 12/12/2017   Procedure: Extraction of tooth #'s 1, 12,13,14,15,17,18,19, 29, and 30 with alveoloplasty and gross debridement of remaining teeth`;  Surgeon: Lenn Cal, DDS;  Location: [facility];  Service: Oral Surgery;  Laterality: Bilateral;  MULTIPLE EXTRACTION WITH ALVEOLOPLASTY WITH PRE PROSTHETIC SURGERY AND GROSS DEBRIDEMENT OF REMAINING TEETH  .  TEE WITHOUT CARDIOVERSION N/A 10/23/2017   Procedure: TRANSESOPHAGEAL ECHOCARDIOGRAM (TEE);  Surgeon: Larey Dresser, MD;  Location: [facility] ENDOSCOPY;  Service: Cardiovascular;  Laterality: N/A;  . TONSILLECTOMY      Family History:  Family History  Problem Relation Age of Onset  . Cancer Father        lung   . Hypertension Mother     Social History: Social History   Socioeconomic History  . Marital status: Married    Spouse name: Not on file  . Number of children: 3  . Years of education: Not on file  . Highest education level: Not on file  Occupational History  . Not on file  Social Needs  . Financial resource strain: Not on file  . Food insecurity:    Worry: Not on file    Inability: Not on file  . Transportation needs:    Medical: Not on file    Non-medical: Not on file  Tobacco Use  . Smoking status: Former Smoker    Packs/day: 1.00    Years: 30.00    Pack years: 30.00    Types: Cigarettes    Last attempt to quit: 04/03/1992    Years since quitting: 26.0  . Smokeless tobacco: Never Used  Substance and Sexual Activity  . Alcohol use: Yes    Comment: 4-6 ounces of wine daily  . Drug use: No    Comment: Half a cup a day.  Marland Kitchen Sexual activity: Not on file  Lifestyle  . Physical activity:    Days per week: Not on file    Minutes per session: Not on file  . Stress: Not on file  Relationships  . Social connections:    Talks on phone: Not on file    Gets together: Not on file    Attends religious service: Not on file    Active member of club or organization: Not on file    Attends meetings of clubs or organizations: Not on file    Relationship status: Not on file  Other Topics Concern  . Not on file  Social History Narrative   Patient is married and lives with his wife.   Youngest daughter lives in [place] also.   Patient has 2 other children.    Allergies:  Allergies  Allergen Reactions  . Rifampin Rash and Other (See Comments)    May have been caused  by Vancomycin or Rifampin (??)  . Vancomycin Rash and Other (See Comments)    May have been caused by Vancomycin or Rifampin (??)    Objective:    Vital Signs:   Temp:  [98.1 F (36.7 C)] 98.1 F (36.7 C) (01/28 0908) Pulse Rate:  [79] 79 (01/28 0908) BP: (123)/(72) 123/72 (01/28 0908) SpO2:  [95 %] 95 % (01/28 0908)  Weight:  [92.5 kg] 92.5 kg (01/28 0908)   Filed Weights   04/30/18 0908  Weight: 92.5 kg     Physical Exam     General:  Well appearing. No respiratory difficulty HEENT: Normal Neck: Supple. no JVD. Carotids 2+ bilat; no bruits. No lymphadenopathy or thyromegaly appreciated. Cor: PMI nondisplaced. Irregular rate & rhythm. No rubs, gallops or murmurs. Lungs: Clear Abdomen: Soft, nontender, nondistended. No hepatosplenomegaly. No bruits or masses. Good bowel sounds. Extremities: No cyanosis, clubbing, rash. 2+ ankle edema.  Neuro: Alert & oriented x 3, cranial nerves grossly intact. moves all 4 extremities w/o difficulty. Affect pleasant.   Telemetry   Atrial fibrillation   Labs     Basic Metabolic Panel: Recent Labs  Lab 04/30/18 0941  NA 136  K 4.5  CL 100  CO2 26  GLUCOSE 118*  BUN 43*  CREATININE 1.38*  CALCIUM 9.8    Liver Function Tests: No results for input(s): AST, ALT, ALKPHOS, BILITOT, PROT, ALBUMIN in the last 168 hours. No results for input(s): LIPASE, AMYLASE in the last 168 hours. No results for input(s): AMMONIA in the last 168 hours.  CBC: Recent Labs  Lab 04/30/18 0941  WBC 4.8  HGB 11.9*  HCT 37.5*  MCV 106.2*  PLT 112*    Cardiac Enzymes: No results for input(s): CKTOTAL, CKMB, CKMBINDEX, TROPONINI in the last 168 hours.  BNP: BNP (last 3 results) Recent Labs    08/01/17 1804 10/18/17 1510  BNP 784.4* 1,073.3*    ProBNP (last 3 results) Recent Labs    05/25/17 1044 07/10/17 1523  PROBNP 2,749* 411.0*     CBG: Recent Labs  Lab 04/30/18 0912  GLUCAP 106*    Coagulation Studies: Recent Labs     04/30/18 0941  LABPROT 14.6  INR 1.15    Imaging:  No results found.   Assessment/Plan   Patient presents for RHC/LHC pre-percutaneous MV repair.  Has been on Lovenox bridge off warfarin, INR 1.15.  Creatinine stable 1.38.    Loralie Champagne, MD 04/30/2018, 10:52 AM  Advanced Heart Failure Team Pager [phone] (M-F; 7a - 4p)  Please contact [facility] for night-coverage after hours (4p -7a ) and weekends on [email or web address]

## 2018-04-30 NOTE — Progress Notes (Signed)
ANTICOAGULATION CONSULT NOTE - Initial Consult  Pharmacy Consult for enoxaparin Indication: AFib + mechanical valve  Allergies  Allergen Reactions  . Rifampin Rash and Other (See Comments)    May have been caused by Vancomycin or Rifampin (??)  . Vancomycin Rash and Other (See Comments)    May have been caused by Vancomycin or Rifampin (??)    Patient Measurements: Height: 6\' 2"  (188 cm) Weight: 204 lb (92.5 kg) IBW/kg (Calculated) : 82.2  Vital Signs: Temp: 97.9 F (36.6 C) (01/28 1313) Temp Source: Oral (01/28 1313) BP: 111/56 (01/28 1313) Pulse Rate: 72 (01/28 1313)  Labs: Recent Labs    04/30/18 0941  HGB 11.9*  HCT 37.5*  PLT 112*  LABPROT 14.6  INR 1.15  CREATININE 1.38*    Estimated Creatinine Clearance: 48.8 mL/min (A) (by C-G formula based on SCr of 1.38 mg/dL (H)).   Medical History: Past Medical History:  Diagnosis Date  . Carotid artery disease ([place]) 1994   s/p left carotid endarerectomy   . Chronic atrial fibrillation    a. on coumadin   . Chronic diastolic CHF (congestive heart failure) ([place])   . Diabetes mellitus without complication ([place])    dx 2016  . Dyspnea   . Epistaxis   . Heart murmur   . Hx of CABG    a. 1994  . Hypertension   . OSA (obstructive sleep apnea)   . Rheumatic fever   . S/P AVR (aortic valve replacement)    a. mechanical valve 1996  . Subclavian bypass stenosis ([place])   . 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 Vitamin B12 deficiency      Assessment: 74 yoM on warfarin PTA admitted for heart cath. Warfarin has been on hold with enoxaparin bridge. INR 1.15, pt to resume warfarin tonight per discharge orders. Pharmacy asked to assist with dosing enoxaparin 8hr after sheath removal and 12 hr after last dose. Pt was on enoxaparin 100mg  SQ BID PTA with last dose 1/27 at 0800. Sheath removed at 1245.   Goal of Therapy:  Monitor platelets by anticoagulation  protocol: Yes   Plan:  -Enoxaparin 100mg  BID starting tonight at 2100  Arrie Senate, PharmD, BCPS Clinical Pharmacist Please check AMION for all [facility] numbers 04/30/2018

## 2018-04-30 NOTE — Progress Notes (Signed)
Legrand Como (pharmacist) called and stated [name] should start lovenox tonight, 900 pm or Later.

## 2018-05-01 ENCOUNTER — Encounter (HOSPITAL_COMMUNITY): Payer: Self-pay | Admitting: Cardiology

## 2018-05-03 ENCOUNTER — Other Ambulatory Visit (HOSPITAL_COMMUNITY): Payer: Self-pay | Admitting: Cardiology

## 2018-05-03 ENCOUNTER — Telehealth (HOSPITAL_COMMUNITY): Payer: Self-pay | Admitting: *Deleted

## 2018-05-03 DIAGNOSIS — R040 Epistaxis: Secondary | ICD-10-CM | POA: Diagnosis not present

## 2018-05-03 DIAGNOSIS — Z7901 Long term (current) use of anticoagulants: Secondary | ICD-10-CM | POA: Diagnosis not present

## 2018-05-03 DIAGNOSIS — Z5181 Encounter for therapeutic drug level monitoring: Secondary | ICD-10-CM | POA: Diagnosis not present

## 2018-05-03 MED ORDER — ALLOPURINOL 100 MG PO TABS: 100.0000 mg | ORAL_TABLET | Freq: Every day | ORAL | 1 refills | Status: DC

## 2018-05-03 NOTE — Telephone Encounter (Signed)
-----   Message from Harvie Junior, [place] sent at 05/01/2018 10:44 AM EST ----- Done.  ----- Message ----- From: Larey Dresser, MD Sent: 04/30/2018  12:59 PM EST To: Scarlette Calico, RN, Harvie Junior, CMA  Please make sure Mr Cullers's cath report from today and echo report from today goes to Dr. Bridgette Habermann at [facility].    Also make sure he resumes his Lovenox bridge/warfarin per coumadin clinic.

## 2018-05-06 ENCOUNTER — Ambulatory Visit (INDEPENDENT_AMBULATORY_CARE_PROVIDER_SITE_OTHER): Payer: Medicare Other

## 2018-05-06 ENCOUNTER — Telehealth (HOSPITAL_COMMUNITY): Payer: Self-pay | Admitting: *Deleted

## 2018-05-06 DIAGNOSIS — Z7189 Other specified counseling: Secondary | ICD-10-CM | POA: Diagnosis not present

## 2018-05-06 DIAGNOSIS — Z952 Presence of prosthetic heart valve: Secondary | ICD-10-CM

## 2018-05-06 DIAGNOSIS — I4891 Unspecified atrial fibrillation: Secondary | ICD-10-CM | POA: Diagnosis not present

## 2018-05-06 LAB — POCT INR: INR: 1.4 — AB (ref 2.0–3.0)

## 2018-05-06 NOTE — Patient Instructions (Signed)
Description   Take 2 tablets today, then 1.5 tablets tomorrow, then start taking 1 tablet daily.  Continue on Lovenox every 12 hours.  Recheck on Thursday.

## 2018-05-06 NOTE — Telephone Encounter (Signed)
CDs of cath and echo mailed to Dr.Rinaldi/attn: Colletta Maryland at [facility].

## 2018-05-08 ENCOUNTER — Other Ambulatory Visit (HOSPITAL_COMMUNITY): Payer: Self-pay

## 2018-05-08 MED ORDER — WARFARIN SODIUM 5 MG PO TABS: ORAL_TABLET | ORAL | 0 refills | Status: DC

## 2018-05-09 ENCOUNTER — Ambulatory Visit (INDEPENDENT_AMBULATORY_CARE_PROVIDER_SITE_OTHER): Payer: Medicare Other | Admitting: Pharmacist

## 2018-05-09 DIAGNOSIS — Z952 Presence of prosthetic heart valve: Secondary | ICD-10-CM

## 2018-05-09 DIAGNOSIS — I4891 Unspecified atrial fibrillation: Secondary | ICD-10-CM

## 2018-05-09 DIAGNOSIS — Z7189 Other specified counseling: Secondary | ICD-10-CM

## 2018-05-09 LAB — POCT INR: INR: 1.9 — AB (ref 2.0–3.0)

## 2018-05-09 NOTE — Patient Instructions (Addendum)
Description   Take 2 tablets today, then 1.5 tablets tomorrow, then continue taking 1 tablet daily.  Continue on Lovenox through Saturday.  Recheck INR on Tuesday.

## 2018-05-14 ENCOUNTER — Ambulatory Visit (INDEPENDENT_AMBULATORY_CARE_PROVIDER_SITE_OTHER): Payer: Medicare Other

## 2018-05-14 DIAGNOSIS — Z952 Presence of prosthetic heart valve: Secondary | ICD-10-CM | POA: Diagnosis not present

## 2018-05-14 DIAGNOSIS — Z7189 Other specified counseling: Secondary | ICD-10-CM | POA: Diagnosis not present

## 2018-05-14 DIAGNOSIS — I4891 Unspecified atrial fibrillation: Secondary | ICD-10-CM | POA: Diagnosis not present

## 2018-05-14 LAB — POCT INR: INR: 2.2 (ref 2.0–3.0)

## 2018-05-14 NOTE — Patient Instructions (Signed)
Take 2 tablets today, then START NEW DOSAGE of 1 tablet daily EXCEPT 1.5 TABLETS ON FRIDAYS.    STOP LOVENOX.  Recheck INR in 2 weeks.

## 2018-05-17 ENCOUNTER — Other Ambulatory Visit (HOSPITAL_COMMUNITY): Payer: Self-pay | Admitting: *Deleted

## 2018-05-17 MED ORDER — POTASSIUM CHLORIDE CRYS ER 20 MEQ PO TBCR: EXTENDED_RELEASE_TABLET | ORAL | 3 refills | Status: DC

## 2018-05-21 DIAGNOSIS — R040 Epistaxis: Secondary | ICD-10-CM | POA: Diagnosis not present

## 2018-05-21 DIAGNOSIS — Z5181 Encounter for therapeutic drug level monitoring: Secondary | ICD-10-CM | POA: Diagnosis not present

## 2018-05-21 DIAGNOSIS — Z7901 Long term (current) use of anticoagulants: Secondary | ICD-10-CM | POA: Diagnosis not present

## 2018-05-25 ENCOUNTER — Other Ambulatory Visit: Payer: Self-pay | Admitting: Physical Medicine & Rehabilitation

## 2018-05-27 ENCOUNTER — Ambulatory Visit (HOSPITAL_COMMUNITY)
Admission: RE | Admit: 2018-05-27 | Discharge: 2018-05-27 | Disposition: A | Discharge status: Home / Self Care | Payer: Medicare Other | Source: Ambulatory Visit | Attending: Cardiology | Admitting: Cardiology

## 2018-05-27 VITALS — BP 126/60 | HR 88 | Wt 217.4 lb

## 2018-05-27 DIAGNOSIS — I4891 Unspecified atrial fibrillation: Secondary | ICD-10-CM | POA: Diagnosis not present

## 2018-05-27 DIAGNOSIS — I2510 Atherosclerotic heart disease of native coronary artery without angina pectoris: Secondary | ICD-10-CM | POA: Diagnosis not present

## 2018-05-27 DIAGNOSIS — I252 Old myocardial infarction: Secondary | ICD-10-CM

## 2018-05-27 DIAGNOSIS — I340 Nonrheumatic mitral (valve) insufficiency: Secondary | ICD-10-CM

## 2018-05-27 DIAGNOSIS — I5032 Chronic diastolic (congestive) heart failure: Secondary | ICD-10-CM | POA: Insufficient documentation

## 2018-05-27 LAB — COMPREHENSIVE METABOLIC PANEL
ALT: 13 U/L (ref 0–44)
AST: 21 U/L (ref 15–41)
Albumin: 3.8 g/dL (ref 3.5–5.0)
Alkaline Phosphatase: 100 U/L (ref 38–126)
Anion gap: 17 — ABNORMAL HIGH (ref 5–15)
BUN: 48 mg/dL — ABNORMAL HIGH (ref 8–23)
CO2: 22 mmol/L (ref 22–32)
Calcium: 9.2 mg/dL (ref 8.9–10.3)
Chloride: 96 mmol/L — ABNORMAL LOW (ref 98–111)
Creatinine, Ser: 1.44 mg/dL — ABNORMAL HIGH (ref 0.61–1.24)
GFR calc Af Amer: 52 mL/min — ABNORMAL LOW (ref >60)
GFR calc non Af Amer: 45 mL/min — ABNORMAL LOW (ref >60)
Glucose, Bld: 86 mg/dL (ref 70–99)
POTASSIUM: 3.7 mmol/L (ref 3.5–5.1)
SODIUM: 135 mmol/L (ref 135–145)
Total Bilirubin: 0.8 mg/dL (ref 0.3–1.2)
Total Protein: 7.6 g/dL (ref 6.5–8.1)

## 2018-05-27 LAB — LIPID PANEL
CHOLESTEROL: 129 mg/dL (ref 0–200)
HDL: 51 mg/dL (ref >40)
LDL Cholesterol: 64 mg/dL (ref 0–99)
Total CHOL/HDL Ratio: 2.5 RATIO
Triglycerides: 69 mg/dL (ref <150)
VLDL: 14 mg/dL (ref 0–40)

## 2018-05-27 LAB — TSH: TSH: 17.054 u[IU]/mL — ABNORMAL HIGH (ref 0.350–4.500)

## 2018-05-27 MED ORDER — SPIRONOLACTONE 25 MG PO TABS: 25.0000 mg | ORAL_TABLET | Freq: Every day | ORAL | 3 refills | Status: DC

## 2018-05-27 MED ORDER — METOLAZONE 2.5 MG PO TABS: 2.5000 mg | ORAL_TABLET | ORAL | 6 refills | Status: DC

## 2018-05-27 NOTE — Patient Instructions (Addendum)
INCREASE Spironolactone to 25mg  (1 tab) daily  INCREASE Metolazone 2.5mg  to Twice a week EVERY Monday AND Thursday  Labs today and REPEAT in 2 weeks We will only contact you if something comes back abnormal or we need to make some changes. Otherwise no news is good news!  Your physician recommends that you schedule a follow-up appointment in: 1 month in NP/PA clinic

## 2018-05-28 ENCOUNTER — Telehealth (HOSPITAL_COMMUNITY): Payer: Self-pay

## 2018-05-28 DIAGNOSIS — I5032 Chronic diastolic (congestive) heart failure: Secondary | ICD-10-CM

## 2018-05-28 NOTE — Telephone Encounter (Signed)
-----   Message from Larey Dresser, MD sent at 05/28/2018  3:12 PM EST ----- That is fine.

## 2018-05-28 NOTE — Telephone Encounter (Signed)
ERROR

## 2018-05-28 NOTE — Progress Notes (Signed)
Advanced Heart Failure Clinic Note   PCP: [name], [name], MD PCP-Cardiologist: [name], MD  HF: [name]  HPI: [name] is a 83 y.o. male with h/o mechanical AVR 1994, OSA on CPAP, Chronic afib, chronic combined CHF, CKD III, chronic coumadin therapy, CAD s/p CAGB 1994, and COPD.   Admitted 7/18-11/05/17 from [facility] office with A/C diastolic HF. Diuresed 83 lbs with lasix drip, metolazone, and diamox. Transitioned to torsemide 80 mg BID. Determined not to be a candidate for MitraClip. Dr [name] also consulted and recommended aggressive medical therapy versus  percutaneous MVR trial as he felt very high risk for conventional surgery. Course complicated by drop in hemoglobin and symptomatic hypotension, requiring 1 unit pRBCs. GI consulted and did not recommend scoping. BB and spiro were stopped due to low BPs. Had some AKI assoicated with low BP, but resolved by discharge. Required lovenox bridge at DC with subtherapeutic INR. Discharged with [facility]. DC weight: 216 lbs.   Admitted 9/11 - 12/14/17 for dental extractions in setting of work up for MVR. Hospital course complicated by run of VT in the setting of hypokalemia. Started on amiodarone with h/o of arrhythmia. Bridged with lovenox.   Admitted 9/22 - 12/28/17 with symptomatic anemia from bleeding gums. Bleeding stopped with holding heparin and coumadin. Received multiple units of blood.   He was admitted again in 10/19 with left thigh hematoma and went from there to inpatient rehab.   He has been evaluated in [place] for the [facility] transcatheter mitral valve.  RHC/LHC was done in 1/20 as part of his pre-op evaluation.  Filling pressures and cardiac output were well-compensated, he had patent SVG-PDA.  Echo in 1/20 showed EF 40-45%, moderate RV dilation with normal systolic function, severe MR with likely rheumatic mitral valve, normal St Jude mechanical aortic valve.   He returns for followup of diastolic CHF, mechanical  aortic valve, chronic atrial fibrillation, and CAD.  He is doing much better than a few months ago.  He has dyspnea only with a long walk or a hill.  He is walking now with only a cane outside the house.  Does not use a cane in his house.  No orthopnea/PND.  No chest pain.     Labs (10/19): hgb 9.2 Labs (11/19): K 3.2, creatinine 1.31 Labs (1/20): K 4.5, creatinine 1.38  ROS:  All systems reviewed and negative except as per HPI.    PMH: 1. Carotid stenosis: S/p left CEA in 1994.  2. Atrial fibrillation: Chronic.  He is on warfarin with mechanical aortic valve.  3. Type II diabetes.  4. COPD: PFTs (11/19) with moderate obstructive airways disease and severely decreased DLCO.  5. OSA: Uses CPAP.  6. 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  7. ITP 8. VT: On amiodarone.  9. CKD: Stage 3.  10. 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  11. Chronic systolic CHF: TEE (0/93) with EF 55-60%, D-shaped septum, mildly dilated RV with mildly decreased systolic function, mechanical aortic valve, rheumatic-appearing mitral valve with severe MR, minimal stenosis.  - RHC (1/20): mean RA 6, PA 54/20, mean PCWP 15, CI 3.42, PVR 2.1 WU, PAPI 5.2 - Echo (1/20): EF 40-45%, moderate RV dilation with normal systolic function, severe biatrial enlargement, severe rheumatic MR, St Jude mechanical aortic valve functioning normally.  12. ABIs (12/18): Normal.  13. CAD: S/p CABG 1994.  - LHC (1/20): 50% D1,  60% ostial LCx, totally occluded RCA with patent SVG-PDA.     Current Outpatient Medications  Medication Sig Dispense Refill  . acetaminophen (TYLENOL) 500 MG tablet Take 1,000 mg by mouth every 6 (six) hours as needed for moderate pain or headache.    . allopurinol (ZYLOPRIM) 100 MG tablet Take 1 tablet (100 mg total) by mouth daily. Additional refills should come from PCP 30 tablet 1  . amiodarone  (PACERONE) 200 MG tablet Take 1 tablet (200 mg total) by mouth daily. 30 tablet 3  . enoxaparin (LOVENOX) 100 MG/ML injection Inject 1 mL (100 mg total) into the skin every 12 (twelve) hours. 20 Syringe 1  . ezetimibe-simvastatin (VYTORIN) 10-20 MG tablet Take 1 tablet by mouth at bedtime. 90 tablet 3  . metolazone (ZAROXOLYN) 2.5 MG tablet Take 1 tablet (2.5 mg total) by mouth 2 (two) times a week. And Thursday 12 tablet 6  . polyethylene glycol powder (GLYCOLAX/MIRALAX) powder Take 17 g by mouth daily as needed for moderate constipation.    . potassium chloride SA (K-DUR,KLOR-CON) 20 MEQ tablet Take 60 meq by mouth in the morning, take 40 meq by mouth at noon and take 40 meq by mouth in the evening 210 tablet 3  . spironolactone (ALDACTONE) 25 MG tablet Take 1 tablet (25 mg total) by mouth daily for 30 days. 30 tablet 3  . torsemide (DEMADEX) 100 MG tablet TAKE 1 TABLET BY MOUTH TWICE A DAY (Patient taking differently: Take 100 mg by mouth 2 (two) times daily. ) 60 tablet 0  . warfarin (COUMADIN) 5 MG tablet One pill daily on Tue, Thurs, Sat, Sun 30 tablet 0   No current facility-administered medications for this encounter.    Allergies  Allergen Reactions  . Rifampin Rash and Other (See Comments)    May have been caused by Vancomycin or Rifampin (??)  . Vancomycin Rash and Other (See Comments)    May have been caused by Vancomycin or Rifampin (??)   Social History   Socioeconomic History  . Marital status: Married    Spouse name: Not on file  . Number of children: 3  . Years of education: Not on file  . Highest education level: Not on file  Occupational History  . Not on file  Social Needs  . Financial resource strain: Not on file  . Food insecurity:    Worry: Not on file    Inability: Not on file  . Transportation needs:    Medical: Not on file    Non-medical: Not on file  Tobacco Use  . Smoking status: Former Smoker    Packs/day: 1.00    Years: 30.00    Pack years: 30.00     Types: Cigarettes    Last attempt to quit: 04/03/1992    Years since quitting: 26.1  . Smokeless tobacco: Never Used  Substance and Sexual Activity  . Alcohol use: Yes    Comment: 4-6 ounces of wine daily  . Drug use: No    Comment: Half a cup a day.  Marland Kitchen Sexual activity: Not on file  Lifestyle  . Physical activity:    Days per week: Not on file    Minutes per session: Not on file  . Stress: Not on file  Relationships  . Social connections:    Talks on phone: Not on file    Gets together: Not on file    Attends religious service: Not on file    Active member of club or organization:  Not on file    Attends meetings of clubs or organizations: Not on file    Relationship status: Not on file  . Intimate partner violence:    Fear of current or ex partner: No    Emotionally abused: No    Physically abused: No    Forced sexual activity: No  Other Topics Concern  . Not on file  Social History Narrative   Patient is married and lives with his wife.   Youngest daughter lives in [place] also.   Patient has 2 other children.    Family History  Problem Relation Age of Onset  . Cancer Father        lung   . Hypertension Mother    Vitals:   05/27/18 1424  BP: 126/60  Pulse: 88  SpO2: 94%  Weight: 98.6 kg (217 lb 6.4 oz)     Wt Readings from Last 3 Encounters:  05/27/18 98.6 kg (217 lb 6.4 oz)  04/30/18 92.5 kg (204 lb)  04/28/18 93.9 kg (207 lb)   PHYSICAL EXAM: General: NAD Neck: No JVD, no thyromegaly or thyroid nodule.  Lungs: Clear to auscultation bilaterally with normal respiratory effort. CV: Nondisplaced PMI.  Heart irregular S1/S2 with mechanical S1, no S3/S4, 3/6 HSM apex.  Trace ankle edema.  No carotid bruit.  Normal pedal pulses.  Abdomen: Soft, nontender, no hepatosplenomegaly, no distention.  Skin: Intact without lesions or rashes.  Neurologic: Alert and oriented x 3.  Psych: Normal affect. Extremities: No clubbing or cyanosis.  HEENT: Normal.      ASSESSMENT & PLAN: 1. Chronic systolic CHF with prominent RV failure:  Echo in 1/20 with EF 40-45%, moderate RV dilation/normal function, severe rheumatic mitral regurgitation, normal mechanical aortic valve.  Severe MR likely plays a significant role in CHF and RV failure. NYHA class II.  He is gradually improving.  Weight is up though he is not significantly volume overloaded on exam.     - Continue torsemide 100 mg bid and increase metolazone to 2.5 mg twice weekly. BMET today and in 2 wks.  - Increase spironolactone to 25 mg daily.    - Would add Coreg at low dose at next appointment.  2. Mechanical aortic valve: Appeared to function well on last echo.  - Continue warfarin with INR goal 2.5-3.5 (also with atrial fibrillation).  - He is off aspirin with recent thigh hematoma.   3. Atrial fibrillation: Chronic, rate is controlled.  4. CAD: s/p CABG.  No chest pain.  Cath in 1/20 with patent SVG-PDA, no interventional target.  - No ASA given stable CAD with warfarin use.   - Continue Vytorin, check lipids today.  5. COPD: He is no longer using oxygen during the day.  Moderate COPD on 11/19 PFTs.   6. OSA: Continue nightly BiPAP.  7.CKD Stage 3: BMET today.  8. Thrombocytopenia: Chronic, has been mild/stable.   9. Severe MR: TEE 7/19 with severe MR with possible rheumatic MV (do not think MS is significant, mildly elevated mean gradient from high flow with severe MR).  Structural Heart team evaluated, not a Mitraclip candidate. Seen by Dr. [name], would be high risk for open surgery.   - He is being evaluated at the [facility] for the [facility] transcatheter mitral valve.   10. VT: He is on amiodarone to suppress VT.  - Check LFTs, TSH today.  Will need regular eye exam on amiodaeone.   Followup with APP in 1 month.   Loralie Champagne 05/28/2018

## 2018-05-28 NOTE — Telephone Encounter (Signed)
TSH  Spoke with patient, aware of lab results. Per MD, pt needs repeat TSH adding Free T3 and T4.  Pt has appt in 2 weeks, per Dr. [name], ok to wait 2 weeks to repeat. Pt amenable to plan.

## 2018-05-28 NOTE — Telephone Encounter (Signed)
-----   Message from Larey Dresser, MD sent at 05/27/2018 10:43 PM EST ----- TSH high, will likely need treatment of hypothyroidism.  Please send free T4 and free T3 along with repeat TSH.

## 2018-05-29 ENCOUNTER — Other Ambulatory Visit (HOSPITAL_COMMUNITY): Payer: Self-pay | Admitting: Cardiology

## 2018-05-29 ENCOUNTER — Ambulatory Visit (INDEPENDENT_AMBULATORY_CARE_PROVIDER_SITE_OTHER): Payer: Medicare Other | Admitting: Pharmacist

## 2018-05-29 DIAGNOSIS — Z7189 Other specified counseling: Secondary | ICD-10-CM

## 2018-05-29 DIAGNOSIS — I4891 Unspecified atrial fibrillation: Secondary | ICD-10-CM

## 2018-05-29 DIAGNOSIS — Z952 Presence of prosthetic heart valve: Secondary | ICD-10-CM | POA: Diagnosis not present

## 2018-05-29 LAB — POCT INR: INR: 1.9 — AB (ref 2.0–3.0)

## 2018-05-29 NOTE — Patient Instructions (Signed)
Description   Take 1.5 tablets today and tomorrow, then start taking 1 tablet daily except 1.5 tablets on Mondays, Wednesdays, and Fridays.

## 2018-06-06 DIAGNOSIS — E119 Type 2 diabetes mellitus without complications: Secondary | ICD-10-CM | POA: Diagnosis not present

## 2018-06-06 DIAGNOSIS — I5022 Chronic systolic (congestive) heart failure: Secondary | ICD-10-CM | POA: Diagnosis not present

## 2018-06-06 DIAGNOSIS — Z9889 Other specified postprocedural states: Secondary | ICD-10-CM | POA: Diagnosis not present

## 2018-06-06 DIAGNOSIS — Z952 Presence of prosthetic heart valve: Secondary | ICD-10-CM | POA: Diagnosis not present

## 2018-06-06 DIAGNOSIS — J439 Emphysema, unspecified: Secondary | ICD-10-CM | POA: Diagnosis not present

## 2018-06-06 DIAGNOSIS — I340 Nonrheumatic mitral (valve) insufficiency: Secondary | ICD-10-CM | POA: Diagnosis not present

## 2018-06-06 DIAGNOSIS — N183 Chronic kidney disease, stage 3 (moderate): Secondary | ICD-10-CM | POA: Diagnosis not present

## 2018-06-06 DIAGNOSIS — I2510 Atherosclerotic heart disease of native coronary artery without angina pectoris: Secondary | ICD-10-CM | POA: Diagnosis not present

## 2018-06-06 DIAGNOSIS — Z951 Presence of aortocoronary bypass graft: Secondary | ICD-10-CM | POA: Diagnosis not present

## 2018-06-06 DIAGNOSIS — I5032 Chronic diastolic (congestive) heart failure: Secondary | ICD-10-CM | POA: Diagnosis not present

## 2018-06-06 DIAGNOSIS — I4891 Unspecified atrial fibrillation: Secondary | ICD-10-CM | POA: Diagnosis not present

## 2018-06-07 ENCOUNTER — Ambulatory Visit (INDEPENDENT_AMBULATORY_CARE_PROVIDER_SITE_OTHER): Payer: Medicare Other | Admitting: *Deleted

## 2018-06-07 DIAGNOSIS — Z952 Presence of prosthetic heart valve: Secondary | ICD-10-CM | POA: Diagnosis not present

## 2018-06-07 DIAGNOSIS — Z7189 Other specified counseling: Secondary | ICD-10-CM

## 2018-06-07 DIAGNOSIS — I4891 Unspecified atrial fibrillation: Secondary | ICD-10-CM | POA: Diagnosis not present

## 2018-06-07 DIAGNOSIS — Z5181 Encounter for therapeutic drug level monitoring: Secondary | ICD-10-CM | POA: Diagnosis not present

## 2018-06-07 LAB — POCT INR: INR: 2 (ref 2.0–3.0)

## 2018-06-07 NOTE — Patient Instructions (Signed)
Description   Take 2 tablets today and 1.5 tablets tomorrow, then start taking 1.5 tablets daily except 1 tablet on Sundays, Tuesdays, and Thursdays. Drinking 1 Premier Protein shake each day (contains 25% RDV of Vitamin K). Recheck INR in 10 days.

## 2018-06-10 ENCOUNTER — Ambulatory Visit (HOSPITAL_COMMUNITY)
Admission: RE | Admit: 2018-06-10 | Discharge: 2018-06-10 | Disposition: A | Discharge status: Home / Self Care | Payer: Medicare Other | Source: Ambulatory Visit | Attending: Internal Medicine | Admitting: Internal Medicine

## 2018-06-10 DIAGNOSIS — I5032 Chronic diastolic (congestive) heart failure: Secondary | ICD-10-CM | POA: Diagnosis not present

## 2018-06-10 LAB — BASIC METABOLIC PANEL
Anion gap: 13 (ref 5–15)
BUN: 50 mg/dL — ABNORMAL HIGH (ref 8–23)
CO2: 24 mmol/L (ref 22–32)
Calcium: 9.4 mg/dL (ref 8.9–10.3)
Chloride: 95 mmol/L — ABNORMAL LOW (ref 98–111)
Creatinine, Ser: 1.67 mg/dL — ABNORMAL HIGH (ref 0.61–1.24)
GFR calc Af Amer: 44 mL/min — ABNORMAL LOW (ref >60)
GFR, EST NON AFRICAN AMERICAN: 38 mL/min — AB (ref >60)
Glucose, Bld: 173 mg/dL — ABNORMAL HIGH (ref 70–99)
POTASSIUM: 3.8 mmol/L (ref 3.5–5.1)
Sodium: 132 mmol/L — ABNORMAL LOW (ref 135–145)

## 2018-06-10 LAB — TSH: TSH: 16.725 u[IU]/mL — ABNORMAL HIGH (ref 0.350–4.500)

## 2018-06-10 LAB — T4, FREE: Free T4: 0.67 ng/dL — ABNORMAL LOW (ref 0.82–1.77)

## 2018-06-11 ENCOUNTER — Telehealth (HOSPITAL_COMMUNITY): Payer: Self-pay

## 2018-06-11 DIAGNOSIS — E038 Other specified hypothyroidism: Secondary | ICD-10-CM

## 2018-06-11 LAB — T3, FREE: T3, Free: 2.6 pg/mL (ref 2.0–4.4)

## 2018-06-11 MED ORDER — LEVOTHYROXINE SODIUM 25 MCG PO TABS: 25.0000 ug | ORAL_TABLET | Freq: Every day | ORAL | 6 refills | Status: DC

## 2018-06-11 NOTE — Telephone Encounter (Signed)
-----   Message from Larey Dresser, MD sent at 06/10/2018 10:50 PM EDT ----- Hypothyroidism, may be related to amiodarone.  Start levothyroxine 25 mcg daily.  Repeat TSH in 1 month.  Forward labs to PCP.

## 2018-06-11 NOTE — Telephone Encounter (Signed)
Spoke with patient, aware of results of thyroid function test.  Per MD, pt to start levothyroxine 50mcg daily.  Pt verbalizes understanding. pt has a f/u with primary later this month.  Pt amendable to plan. Results forwarded to PCP.

## 2018-06-17 ENCOUNTER — Ambulatory Visit (INDEPENDENT_AMBULATORY_CARE_PROVIDER_SITE_OTHER): Payer: Medicare Other | Admitting: Pharmacist

## 2018-06-17 ENCOUNTER — Other Ambulatory Visit: Payer: Self-pay

## 2018-06-17 DIAGNOSIS — Z952 Presence of prosthetic heart valve: Secondary | ICD-10-CM | POA: Diagnosis not present

## 2018-06-17 DIAGNOSIS — Z7189 Other specified counseling: Secondary | ICD-10-CM

## 2018-06-17 DIAGNOSIS — I4891 Unspecified atrial fibrillation: Secondary | ICD-10-CM | POA: Diagnosis not present

## 2018-06-17 LAB — POCT INR: INR: 2.5 (ref 2.0–3.0)

## 2018-06-17 NOTE — Patient Instructions (Addendum)
Description   Continue taking 1.5 tablets daily except 1 tablet on Sundays, Tuesdays, and Thursdays. Drinking 1 Premier Protein shake each day (contains 25% RDV of Vitamin K). Recheck INR in 14 days.

## 2018-06-19 ENCOUNTER — Telehealth (HOSPITAL_COMMUNITY): Payer: Self-pay

## 2018-06-19 NOTE — Telephone Encounter (Signed)
Called patient to reschedule appointment for May d/t current state of emergency with Corona virus.  Pt reports feeling well with no exposure to anyone with a fever or cough.  No recent travel.  Pt amendable to rescheduling appt from next week.  Pt encouraged to call office if any concerns at home ie change in status, refills etc. Appointment made for 8 weeks and appointment card mailed. Verbalized understanding.

## 2018-06-23 ENCOUNTER — Other Ambulatory Visit: Payer: Self-pay | Admitting: Physical Medicine & Rehabilitation

## 2018-06-24 ENCOUNTER — Other Ambulatory Visit: Payer: Self-pay

## 2018-06-24 MED ORDER — EZETIMIBE-SIMVASTATIN 10-20 MG PO TABS: 1.0000 | ORAL_TABLET | Freq: Every day | ORAL | 0 refills | Status: DC

## 2018-06-25 ENCOUNTER — Ambulatory Visit: Payer: Self-pay | Admitting: Internal Medicine

## 2018-06-25 ENCOUNTER — Encounter (HOSPITAL_COMMUNITY): Payer: Self-pay

## 2018-06-26 ENCOUNTER — Other Ambulatory Visit (HOSPITAL_COMMUNITY): Payer: Self-pay | Admitting: Student

## 2018-06-27 ENCOUNTER — Other Ambulatory Visit (HOSPITAL_COMMUNITY): Payer: Self-pay | Admitting: Cardiology

## 2018-06-28 ENCOUNTER — Telehealth: Payer: Self-pay

## 2018-06-28 NOTE — Telephone Encounter (Signed)

## 2018-07-01 ENCOUNTER — Other Ambulatory Visit (INDEPENDENT_AMBULATORY_CARE_PROVIDER_SITE_OTHER): Payer: Medicare Other

## 2018-07-01 ENCOUNTER — Ambulatory Visit (INDEPENDENT_AMBULATORY_CARE_PROVIDER_SITE_OTHER): Payer: Medicare Other | Admitting: Pharmacist

## 2018-07-01 DIAGNOSIS — Z952 Presence of prosthetic heart valve: Secondary | ICD-10-CM | POA: Diagnosis not present

## 2018-07-01 DIAGNOSIS — Z7901 Long term (current) use of anticoagulants: Secondary | ICD-10-CM | POA: Diagnosis not present

## 2018-07-01 DIAGNOSIS — E119 Type 2 diabetes mellitus without complications: Secondary | ICD-10-CM | POA: Diagnosis not present

## 2018-07-01 DIAGNOSIS — I4891 Unspecified atrial fibrillation: Secondary | ICD-10-CM | POA: Diagnosis not present

## 2018-07-01 DIAGNOSIS — Z7189 Other specified counseling: Secondary | ICD-10-CM

## 2018-07-01 LAB — BASIC METABOLIC PANEL
BUN: 59 mg/dL — AB (ref 6–23)
CALCIUM: 9.8 mg/dL (ref 8.4–10.5)
CO2: 31 mEq/L (ref 19–32)
Chloride: 91 mEq/L — ABNORMAL LOW (ref 96–112)
Creatinine, Ser: 1.55 mg/dL — ABNORMAL HIGH (ref 0.40–1.50)
GFR: 43.22 mL/min — ABNORMAL LOW (ref >60.00)
Glucose, Bld: 134 mg/dL — ABNORMAL HIGH (ref 70–99)
Potassium: 3.7 mEq/L (ref 3.5–5.1)
Sodium: 132 mEq/L — ABNORMAL LOW (ref 135–145)

## 2018-07-01 LAB — POCT INR: INR: 2.1 (ref 2.0–3.0)

## 2018-07-01 LAB — HEMOGLOBIN A1C: Hgb A1c MFr Bld: 5.7 % (ref 4.6–6.5)

## 2018-07-02 LAB — FRUCTOSAMINE: Fructosamine: 301 umol/L — ABNORMAL HIGH (ref 0–285)

## 2018-07-04 ENCOUNTER — Ambulatory Visit (INDEPENDENT_AMBULATORY_CARE_PROVIDER_SITE_OTHER): Payer: Medicare Other | Admitting: Endocrinology

## 2018-07-04 ENCOUNTER — Encounter: Payer: Self-pay | Admitting: Endocrinology

## 2018-07-04 DIAGNOSIS — I2510 Atherosclerotic heart disease of native coronary artery without angina pectoris: Secondary | ICD-10-CM

## 2018-07-04 DIAGNOSIS — I252 Old myocardial infarction: Secondary | ICD-10-CM

## 2018-07-04 DIAGNOSIS — E119 Type 2 diabetes mellitus without complications: Secondary | ICD-10-CM | POA: Diagnosis not present

## 2018-07-04 NOTE — Progress Notes (Signed)
Patient ID: [name], male   DOB: 14-May-1935, 83 y.o.   MRN: [id number]   Reason for Appointment: Diabetes follow-up    Today's office visit was provided via telemedicine using video technique . Consent for the patient has been obtained . Location of the patient: Home . Location of the provider: Office Only the patient and myself were participating in the encounter   History of Present Illness   Diagnosis: Type 2 DIABETES MELITUS date of onset: 07/2010       Oral hypoglycemic drugs: metformin 1500 mg daily       Side effects from medications: None  He was started on metformin when his baseline A1c was 6.9% and he had significant obesity He was also sent for diabetes education. Since then A1c had been consistently upper normal  Recent history:  Oral hypoglycemic drugs: Metformin ER 750 mg, 2 daily  His A1c appearing falsely low at 4.7 September Fructosamine is upper normal at 272, no previous comparison available  Current management and blood sugars  He has had a considerable amount of weight loss this year  Also during his last hospitalization because of mildly abnormal renal function he was told to stop metformin she had been taking long-term safely  Has not had any change in appetite  Checking his blood sugars mostly around midday and has only one high reading of 192 otherwise they are usually just over 100 lab glucose 108  He is not able to do much physical activity because of various intercurrent problems His weight appears to be leveling off of in the last 3 to 4 weeks    Monitors blood glucose: Regularly with his  [place] meter   PRE-MEAL Fasting Lunch Dinner  after meals Overall  Glucose range:  102-161    135-187   Mean/median:  125    155  144    Physical activity: exercise: Minimal            Dietician visit: Most recent:2012          Last eye exam: annual    Wt Readings from Last 3 Encounters:  05/27/18 217 lb 6.4 oz (98.6 kg)   04/30/18 204 lb (92.5 kg)  04/28/18 207 lb (93.9 kg)   Lab Results  Component Value Date   HGBA1C 5.7 07/01/2018   HGBA1C 4.7 (L) 12/07/2017   HGBA1C 5.1 10/15/2017   Lab Results  Component Value Date   MICROALBUR 6.4 (H) 10/15/2017   LDLCALC 64 05/27/2018   CREATININE 1.55 (H) 07/01/2018    LABS:  Anti-coag visit on 07/01/2018  Component Date Value Ref Range Status  . INR 07/01/2018 2.1  2.0 - 3.0 Final  Lab on 07/01/2018  Component Date Value Ref Range Status  . Fructosamine 07/01/2018 301* 0 - 285 umol/L Final   Comment: Published reference interval for apparently healthy subjects between age 41 and 61 is 29 - 285 umol/L and in a poorly controlled diabetic population is 228 - 563 umol/L with a mean of 396 umol/L.   Marland Kitchen Sodium 07/01/2018 132* 135 - 145 mEq/L Final  . Potassium 07/01/2018 3.7  3.5 - 5.1 mEq/L Final  . Chloride 07/01/2018 91* 96 - 112 mEq/L Final  . CO2 07/01/2018 31  19 - 32 mEq/L Final  . Glucose, Bld 07/01/2018 134* 70 - 99 mg/dL Final  . BUN 07/01/2018 59* 6 - 23 mg/dL Final  . Creatinine, Ser 07/01/2018 1.55* 0.40 - 1.50 mg/dL Final  . Calcium 07/01/2018 9.8  8.4 - 10.5 mg/dL Final  . GFR 07/01/2018 43.22* >60.00 mL/min Final  . Hgb A1c MFr Bld 07/01/2018 5.7  4.6 - 6.5 % Final   Glycemic Control Guidelines for People with Diabetes:Non Diabetic:  <6%Goal of Therapy: <7%Additional Action Suggested:  >8%     Allergies as of 07/04/2018      Reactions   Rifampin Rash, Other (See Comments)   May have been caused by Vancomycin or Rifampin (??)   Vancomycin Rash, Other (See Comments)   May have been caused by Vancomycin or Rifampin (??)      Medication List       Accurate as of July 04, 2018 10:28 AM. Always use your most recent med list.        acetaminophen 500 MG tablet Commonly known as:  TYLENOL Take 1,000 mg by mouth every 6 (six) hours as needed for moderate pain or headache.   allopurinol 100 MG tablet Commonly known as:  ZYLOPRIM  TAKE 1 TABLET BY MOUTH DAILY. ADDITIONAL REFILLS SHOULD COME FROM PCP   amiodarone 200 MG tablet Commonly known as:  PACERONE Take 1 tablet (200 mg total) by mouth daily.   ezetimibe-simvastatin 10-20 MG tablet Commonly known as:  VYTORIN Take 1 tablet by mouth at bedtime.   levothyroxine 25 MCG tablet Commonly known as:  SYNTHROID, LEVOTHROID Take 1 tablet (25 mcg total) by mouth daily before breakfast.   metolazone 2.5 MG tablet Commonly known as:  ZAROXOLYN Take 1 tablet (2.5 mg total) by mouth 2 (two) times a week. And Thursday   polyethylene glycol powder powder Commonly known as:  GLYCOLAX/MIRALAX Take 17 g by mouth daily as needed for moderate constipation.   potassium chloride SA 20 MEQ tablet Commonly known as:  K-DUR,KLOR-CON Take 60 meq by mouth in the morning, take 40 meq by mouth at noon and take 40 meq by mouth in the evening   spironolactone 25 MG tablet Commonly known as:  ALDACTONE Take 1 tablet (25 mg total) by mouth daily for 30 days.   torsemide 100 MG tablet Commonly known as:  DEMADEX Take 1 tablet (100 mg total) by mouth 2 (two) times daily.   warfarin 5 MG tablet Commonly known as:  COUMADIN Take as directed by the anticoagulation clinic. If you are unsure how to take this medication, talk to your nurse or doctor. Original instructions:  One pill daily on Tue, Thurs, Sat, Sun       Allergies:  Allergies  Allergen Reactions  . Rifampin Rash and Other (See Comments)    May have been caused by Vancomycin or Rifampin (??)  . Vancomycin Rash and Other (See Comments)    May have been caused by Vancomycin or Rifampin (??)    Past Medical History:  Diagnosis Date  . Carotid artery disease ([place]) 1994   s/p left carotid endarerectomy   . Chronic atrial fibrillation    a. on coumadin   . Chronic diastolic CHF (congestive heart failure) ([place])   . Diabetes mellitus without complication ([place])    dx 2016  . Dyspnea   . Epistaxis   . Heart murmur    . Hx of CABG    a. 1994  . Hypertension   . OSA (obstructive sleep apnea)   . Rheumatic fever   . S/P AVR (aortic valve replacement)    a. mechanical valve 1996  . Subclavian bypass stenosis ([place])   . Temporary low platelet count ([facility])    chronic problem since receiving  aortic valve replacement  . Vitamin B12 deficiency     Past Surgical History:  Procedure Laterality Date  . artificial valve    . [place]   replaced due to aortic stenosis, St. Jude mechanical prostesis  . CAROTID ENDARTERECTOMY Left 1997   subclavian bypass Done in [place]  . CORONARY ARTERY BYPASS GRAFT  1994   w SVG to RCA and SVG to circumflex  . heart bypass     Done in [place]  . MULTIPLE EXTRACTIONS WITH ALVEOLOPLASTY Bilateral 12/12/2017   Procedure: Extraction of tooth #'s 1, 12,13,14,15,17,18,19, 29, and 30 with alveoloplasty and gross debridement of remaining teeth`;  Surgeon: Lenn Cal, DDS;  Location: [facility];  Service: Oral Surgery;  Laterality: Bilateral;  MULTIPLE EXTRACTION WITH ALVEOLOPLASTY WITH PRE PROSTHETIC SURGERY AND GROSS DEBRIDEMENT OF REMAINING TEETH  . RIGHT HEART CATH AND CORONARY/GRAFT ANGIOGRAPHY N/A 04/30/2018   Procedure: RIGHT HEART CATH AND CORONARY/GRAFT ANGIOGRAPHY;  Surgeon: Larey Dresser, MD;  Location: [facility];  Service: Cardiovascular;  Laterality: N/A;  . TEE WITHOUT CARDIOVERSION N/A 10/23/2017   Procedure: TRANSESOPHAGEAL ECHOCARDIOGRAM (TEE);  Surgeon: Larey Dresser, MD;  Location: [facility] ENDOSCOPY;  Service: Cardiovascular;  Laterality: N/A;  . TONSILLECTOMY      Family History  Problem Relation Age of Onset  . Cancer Father        lung   . Hypertension Mother     Social History:  reports that he quit smoking about 26 years ago. His smoking use included cigarettes. He has a 30.00 pack-year smoking history. He has never used smokeless tobacco. He reports current alcohol use. He reports that he does not use drugs.  Review of  Systems    HYPERTENSION:  Currently not on any treatment   BP Readings from Last 3 Encounters:  05/27/18 126/60  04/30/18 (!) 141/37  04/28/18 (!) 119/57   He continues to be on diuretics, now large doses and including Zaroxolyn  Lab Results  Component Value Date   CREATININE 1.55 (H) 07/01/2018   BUN 59 (H) 07/01/2018   NA 132 (L) 07/01/2018   K 3.7 07/01/2018   CL 91 (L) 07/01/2018   CO2 31 07/01/2018     HYPERLIPIDEMIA:  Hypercholesterolemia: LDL adequately treated with Vytorin 10/20, prescribed and followed by PCP    Lab Results  Component Value Date   CHOL 129 05/27/2018   HDL 51 05/27/2018   LDLCALC 64 05/27/2018   TRIG 69 05/27/2018   CHOLHDL 2.5 05/27/2018     Last foot exam in 1/19 showing peripheral vascular disease and only mild neuropathy   Examination:   There were no vitals taken for this visit.  There is no height or weight on file to calculate BMI.    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 Any transcriptional errors that result from this  process are unintentional.

## 2018-07-15 ENCOUNTER — Telehealth (HOSPITAL_COMMUNITY): Payer: Self-pay

## 2018-07-15 ENCOUNTER — Other Ambulatory Visit (HOSPITAL_COMMUNITY): Payer: Self-pay

## 2018-07-15 ENCOUNTER — Other Ambulatory Visit: Payer: Self-pay

## 2018-07-15 ENCOUNTER — Ambulatory Visit (HOSPITAL_COMMUNITY)
Admission: RE | Admit: 2018-07-15 | Discharge: 2018-07-15 | Disposition: A | Discharge status: Home / Self Care | Payer: Medicare Other | Source: Ambulatory Visit | Attending: Internal Medicine | Admitting: Internal Medicine

## 2018-07-15 DIAGNOSIS — E038 Other specified hypothyroidism: Secondary | ICD-10-CM | POA: Insufficient documentation

## 2018-07-15 LAB — TSH: TSH: 14.08 u[IU]/mL — ABNORMAL HIGH (ref 0.350–4.500)

## 2018-07-15 MED ORDER — LEVOTHYROXINE SODIUM 50 MCG PO TABS: 50.0000 ug | ORAL_TABLET | Freq: Every day | ORAL | 3 refills | Status: DC

## 2018-07-15 NOTE — Telephone Encounter (Signed)
Relayed message to pt, verbalized understanding. Sent new script to confirmed pharm and added repeat TSH to upcoming appt 5/18 @930 

## 2018-07-15 NOTE — Telephone Encounter (Signed)
-----   Message from Larey Dresser, MD sent at 07/15/2018 12:01 PM EDT ----- TSH still high. Increase levothyroxine to 50 mcg daily.  Repeat TSH 1 month.

## 2018-07-19 ENCOUNTER — Telehealth: Payer: Self-pay

## 2018-07-19 NOTE — Telephone Encounter (Signed)

## 2018-07-22 ENCOUNTER — Ambulatory Visit (INDEPENDENT_AMBULATORY_CARE_PROVIDER_SITE_OTHER): Payer: Medicare Other | Admitting: *Deleted

## 2018-07-22 ENCOUNTER — Other Ambulatory Visit: Payer: Self-pay

## 2018-07-22 DIAGNOSIS — I4891 Unspecified atrial fibrillation: Secondary | ICD-10-CM | POA: Diagnosis not present

## 2018-07-22 DIAGNOSIS — Z952 Presence of prosthetic heart valve: Secondary | ICD-10-CM

## 2018-07-22 DIAGNOSIS — Z7189 Other specified counseling: Secondary | ICD-10-CM

## 2018-07-22 LAB — POCT INR: INR: 3.3 — AB (ref 2.0–3.0)

## 2018-07-22 MED ORDER — WARFARIN SODIUM 5 MG PO TABS: ORAL_TABLET | ORAL | 0 refills | Status: DC

## 2018-07-22 NOTE — Patient Instructions (Addendum)
Description   Spoke with pt and instructed pt to continue taking 1.5 tablets daily except 1 tablet on Tuesdays, and Thursdays. Drinking 1 Premier Protein shake every otherday (contains 25% RDV of Vitamin K). Recheck INR in 3 weeks after HV appt.

## 2018-07-29 ENCOUNTER — Other Ambulatory Visit (HOSPITAL_COMMUNITY): Payer: Self-pay | Admitting: Internal Medicine

## 2018-08-02 ENCOUNTER — Telehealth: Payer: Self-pay | Admitting: Cardiology

## 2018-08-02 NOTE — Telephone Encounter (Signed)
Spoke with patient to make recall appointment.  He decline due to he never received a cpap machine that he has been trying to get since December.  He stated that he has left several messages,

## 2018-08-07 NOTE — Telephone Encounter (Signed)
Received office notes for patient on 08/06/18 dated 06/01/2010 and have faxed them over to [facility].

## 2018-08-07 NOTE — Telephone Encounter (Signed)
Reached out to [facility] to ask why the patient never received his cpap and was told they needed an office note dated before March of 2012. After several calls I was finally able to get the office notes faxed to our medical records so I can fax them over to [facility] so the patient can get his device.

## 2018-08-16 ENCOUNTER — Other Ambulatory Visit (HOSPITAL_COMMUNITY): Payer: Self-pay | Admitting: Cardiology

## 2018-08-16 ENCOUNTER — Telehealth: Payer: Self-pay

## 2018-08-16 NOTE — Telephone Encounter (Signed)

## 2018-08-16 NOTE — Progress Notes (Signed)
Advanced Heart Failure Clinic Note   PCP: [name], [name], MD PCP-Cardiologist: [name], MD  HF: [name]  HPI: [name] is a 83 y.o. male with h/o mechanical AVR 1994, OSA on CPAP, Chronic afib, chronic combined CHF, CKD III, chronic coumadin therapy, CAD s/p CAGB 1994, and COPD.   Admitted 7/18-11/05/17 from [facility] office with A/C diastolic HF. Diuresed 83 lbs with lasix drip, metolazone, and diamox. Transitioned to torsemide 80 mg BID. Determined not to be a candidate for MitraClip. Dr [name] also consulted and recommended aggressive medical therapy versus  percutaneous MVR trial as he felt very high risk for conventional surgery. Course complicated by drop in hemoglobin and symptomatic hypotension, requiring 1 unit pRBCs. GI consulted and did not recommend scoping. BB and spiro were stopped due to low BPs. Had some AKI assoicated with low BP, but resolved by discharge. Required lovenox bridge at DC with subtherapeutic INR. Discharged with [facility]. DC weight: 216 lbs.   Admitted 9/11 - 12/14/17 for dental extractions in setting of work up for MVR. Hospital course complicated by run of VT in the setting of hypokalemia. Started on amiodarone with h/o of arrhythmia. Bridged with lovenox.   Admitted 9/22 - 12/28/17 with symptomatic anemia from bleeding gums. Bleeding stopped with holding heparin and coumadin. Received multiple units of blood.   He was admitted again in 10/19 with left thigh hematoma and went from there to inpatient rehab.   He has been evaluated in [place] for the [facility] transcatheter mitral valve.  RHC/LHC was done in 1/20 as part of his pre-op evaluation.  Filling pressures and cardiac output were well-compensated, he had patent SVG-PDA.  Echo in 1/20 showed EF 40-45%, moderate RV dilation with normal systolic function, severe MR with likely rheumatic mitral valve, normal St Jude mechanical aortic valve.   He returns today for HF follow up. Last visit metolazone  increased to twice weekly and spiro was increased. TSH was checked as part of routine amiodarone monitoring; this was elevated and he was started on synthroid. Overall doing fine. Main complaint is tinnitus and muscle aches. No SOB with walking on flat ground. Sometimes SOB if he goes too quickly. Using a cane at times. No edema, orthopnea, or PND. Great UOP with torsemide. Takes metolazone 2x/week. No dizziness or CP. No bleeding on coumadin. Appetite and energy level good. Not wearing BiPAP because he doesn't have the correct parts. Taking all medications. SBP 110s at home. HR 60-70s. Weight 204-206 lbs at home, stable.  Labs (10/19): hgb 9.2 Labs (11/19): K 3.2, creatinine 1.31 Labs (1/20): K 4.5, creatinine 1.38 Labs (3/20): K 3.7, creatinine 1.55  Review of systems complete and found to be negative unless listed in HPI.   PMH: 1. Carotid stenosis: S/p left CEA in 1994.  2. Atrial fibrillation: Chronic.  He is on warfarin with mechanical aortic valve.  3. Type II diabetes.  4. COPD: PFTs (11/19) with moderate obstructive airways disease and severely decreased DLCO.  5. OSA: Uses CPAP.  6. 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  7. ITP 8. VT: On amiodarone.  9. CKD: Stage 3.  10. 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  11. Chronic systolic CHF: TEE (9/41) with EF 55-60%, D-shaped septum, mildly dilated RV with mildly decreased systolic function, mechanical aortic valve, rheumatic-appearing mitral valve with severe MR, minimal stenosis.  - RHC (1/20): mean RA 6, PA  54/20, mean PCWP 15, CI 3.42, PVR 2.1 WU, PAPI 5.2 - Echo (1/20): EF 40-45%, moderate RV dilation with normal systolic function, severe biatrial enlargement, severe rheumatic MR, St Jude mechanical aortic valve functioning normally.  12. ABIs (12/18): Normal.  13. CAD: S/p CABG 1994.  - LHC (1/20): 50% D1, 60% ostial  LCx, totally occluded RCA with patent SVG-PDA.     Current Outpatient Medications  Medication Sig Dispense Refill  . acetaminophen (TYLENOL) 500 MG tablet Take 1,000 mg by mouth every 6 (six) hours as needed for moderate pain or headache.    . allopurinol (ZYLOPRIM) 100 MG tablet TAKE 1 TABLET BY MOUTH DAILY. ADDITIONAL REFILLS SHOULD COME FROM PCP 30 tablet 1  . amiodarone (PACERONE) 200 MG tablet Take 1 tablet (200 mg total) by mouth daily. 90 tablet 3  . ezetimibe-simvastatin (VYTORIN) 10-20 MG tablet Take 1 tablet by mouth at bedtime. 90 tablet 0  . levothyroxine (SYNTHROID, LEVOTHROID) 50 MCG tablet Take 1 tablet (50 mcg total) by mouth daily before breakfast. 90 tablet 3  . metFORMIN (GLUCOPHAGE) 500 MG tablet Take 500 mg by mouth daily with breakfast.    . metolazone (ZAROXOLYN) 2.5 MG tablet Take 2.5 mg by mouth 2 (two) times a week. Every Monday and thursday    . polyethylene glycol powder (GLYCOLAX/MIRALAX) powder Take 17 g by mouth daily as needed for moderate constipation.    . potassium chloride SA (K-DUR,KLOR-CON) 20 MEQ tablet Take 60 meq by mouth in the morning, take 40 meq by mouth at noon and take 40 meq by mouth in the evening 210 tablet 3  . spironolactone (ALDACTONE) 25 MG tablet Take 25 mg by mouth daily.    Marland Kitchen torsemide (DEMADEX) 100 MG tablet Take 1 tablet (100 mg total) by mouth 2 (two) times daily. 60 tablet 5  . warfarin (COUMADIN) 5 MG tablet Take 1.5 tabs daily except 1 tab on Tues and Thurs or as directed by Coumadin Clinic. 150 tablet 0   No current facility-administered medications for this encounter.    Allergies  Allergen Reactions  . Rifampin Rash and Other (See Comments)    May have been caused by Vancomycin or Rifampin (??)  . Vancomycin Rash and Other (See Comments)    May have been caused by Vancomycin or Rifampin (??)   Social History   Socioeconomic History  . Marital status: Married    Spouse name: Not on file  . Number of children: 3  . Years  of education: Not on file  . Highest education level: Not on file  Occupational History  . Not on file  Social Needs  . Financial resource strain: Not on file  . Food insecurity:    Worry: Not on file    Inability: Not on file  . Transportation needs:    Medical: Not on file    Non-medical: Not on file  Tobacco Use  . Smoking status: Former Smoker    Packs/day: 1.00    Years: 30.00    Pack years: 30.00    Types: Cigarettes    Last attempt to quit: 04/03/1992    Years since quitting: 26.3  . Smokeless tobacco: Never Used  Substance and Sexual Activity  . Alcohol use: Yes    Comment: 4-6 ounces of wine daily  . Drug use: No    Comment: Half a cup a day.  Marland Kitchen Sexual activity: Not on file  Lifestyle  . Physical activity:    Days per week: Not on  file    Minutes per session: Not on file  . Stress: Not on file  Relationships  . Social connections:    Talks on phone: Not on file    Gets together: Not on file    Attends religious service: Not on file    Active member of club or organization: Not on file    Attends meetings of clubs or organizations: Not on file    Relationship status: Not on file  . Intimate partner violence:    Fear of current or ex partner: No    Emotionally abused: No    Physically abused: No    Forced sexual activity: No  Other Topics Concern  . Not on file  Social History Narrative   Patient is married and lives with his wife.   Youngest daughter lives in [place] also.   Patient has 2 other children.    Family History  Problem Relation Age of Onset  . Cancer Father        lung   . Hypertension Mother    Vitals:   08/19/18 1009  BP: 120/62  Pulse: 74  SpO2: 95%  Weight: 96.6 kg (213 lb)     Wt Readings from Last 3 Encounters:  08/19/18 96.6 kg (213 lb)  05/27/18 98.6 kg (217 lb 6.4 oz)  04/30/18 92.5 kg (204 lb)   PHYSICAL EXAM: General:  No resp difficulty. HEENT: Normal Neck: Supple. JVP flat. Carotids 2+ bilat; no bruits. No  thyromegaly or nodule noted. Cor: PMI nondisplaced. IRR, mechanical s1, 3/6 HSM at apex.  Lungs: CTAB, normal effort. Abdomen: Soft, non-tender, non-distended, no HSM. No bruits or masses. +BS  Extremities: No cyanosis, clubbing, or rash. R and LLE no edema.  Neuro: Alert & orientedx3, cranial nerves grossly intact. moves all 4 extremities w/o difficulty. Affect pleasant   ASSESSMENT & PLAN: 1. Chronic systolic CHF with prominent RV failure:  Echo in 1/20 with EF 40-45%, moderate RV dilation/normal function, severe rheumatic mitral regurgitation, normal mechanical aortic valve.  Severe MR likely plays a significant role in CHF and RV failure. NYHA class II.  Volume stable on exam  - Continue torsemide 100 mg bid and metolazone 2.5 mg twice weekly. Check BMET - Continue spironolactone 25 mg daily.    - Add coreg 3.125 mg BID 2. Mechanical aortic valve: Appeared to function well on last echo.  - Continue warfarin with INR goal 2.5-3.5 (also with atrial fibrillation).  - He is off aspirin with recent thigh hematoma.   3. Atrial fibrillation: Chronic, rate is controlled. No change.  4. CAD: s/p CABG.  No s/s ischemia. Cath in 1/20 with patent SVG-PDA, no interventional target.  - No ASA given stable CAD with warfarin use.  Followed by coumadin clinic. Sees today.  - Continue Vytorin. LDL 64 05/2018 5. COPD: He is no longer using oxygen during the day.  Moderate COPD on 11/19 PFTs.  No change.  6. OSA: Not wearing BiPAP. Needs new parts. Encouraged him to f/u with Dr Radford Pax.  7.CKD Stage 3: Check BMET today.  8. Thrombocytopenia: Chronic, has been mild/stable.   9. Severe MR: TEE 7/19 with severe MR with possible rheumatic MV (do not think MS is significant, mildly elevated mean gradient from high flow with severe MR).  Structural Heart team evaluated, not a Mitraclip candidate. Seen by Dr. [name], would be high risk for open surgery.   - He is being [facility] for the [facility]  transcatheter mitral  valve. He was not a candidate for this, but is now being evaluated for a different "experimental valve". He has a telephone visit with him tomorrow. 10. VT: He is on amiodarone to suppress VT.  - Continue amiodarone. LFTs stable 05/2018. TSH elevated. Recheck today. On synthroid now as below.  11. Hypothyroidism - Continue synthroid 50 mcg daily. May be secondary to amiodarone. PCP to manage. He also follows with endocrine for DM. Check TSH, T3, and T4 today. I will forward results to his PCP.  12. Leg cramps - Check K and mag. 13. Tinnitus - Asked him to f/u with PCP.   Check TSH, free T3 and t4, CMET, mag, and CBC Add coreg 3.125 mg BID Follow up with [name] in 3-4 months.   Georgiana Shore, NP 08/19/2018   Greater than 50% of the 25 minute visit was spent in counseling/coordination of care regarding disease state education, salt/fluid restriction, sliding scale diuretics, and medication compliance.

## 2018-08-19 ENCOUNTER — Other Ambulatory Visit: Payer: Self-pay

## 2018-08-19 ENCOUNTER — Ambulatory Visit (INDEPENDENT_AMBULATORY_CARE_PROVIDER_SITE_OTHER): Payer: Medicare Other | Admitting: *Deleted

## 2018-08-19 ENCOUNTER — Ambulatory Visit (HOSPITAL_COMMUNITY)
Admission: RE | Admit: 2018-08-19 | Discharge: 2018-08-19 | Disposition: A | Discharge status: Home / Self Care | Payer: Medicare Other | Source: Ambulatory Visit | Attending: Internal Medicine | Admitting: Internal Medicine

## 2018-08-19 ENCOUNTER — Telehealth (HOSPITAL_COMMUNITY): Payer: Self-pay

## 2018-08-19 VITALS — BP 120/62 | HR 74 | Wt 213.0 lb

## 2018-08-19 DIAGNOSIS — I2510 Atherosclerotic heart disease of native coronary artery without angina pectoris: Secondary | ICD-10-CM

## 2018-08-19 DIAGNOSIS — I252 Old myocardial infarction: Secondary | ICD-10-CM | POA: Diagnosis not present

## 2018-08-19 DIAGNOSIS — Z952 Presence of prosthetic heart valve: Secondary | ICD-10-CM

## 2018-08-19 DIAGNOSIS — I4891 Unspecified atrial fibrillation: Secondary | ICD-10-CM

## 2018-08-19 DIAGNOSIS — Z7189 Other specified counseling: Secondary | ICD-10-CM | POA: Diagnosis not present

## 2018-08-19 DIAGNOSIS — I5032 Chronic diastolic (congestive) heart failure: Secondary | ICD-10-CM | POA: Insufficient documentation

## 2018-08-19 DIAGNOSIS — G4733 Obstructive sleep apnea (adult) (pediatric): Secondary | ICD-10-CM | POA: Diagnosis not present

## 2018-08-19 DIAGNOSIS — I340 Nonrheumatic mitral (valve) insufficiency: Secondary | ICD-10-CM | POA: Diagnosis not present

## 2018-08-19 LAB — COMPREHENSIVE METABOLIC PANEL
ALT: 16 U/L (ref 0–44)
AST: 20 U/L (ref 15–41)
Albumin: 4 g/dL (ref 3.5–5.0)
Alkaline Phosphatase: 107 U/L (ref 38–126)
Anion gap: 15 (ref 5–15)
BUN: 74 mg/dL — ABNORMAL HIGH (ref 8–23)
CO2: 24 mmol/L (ref 22–32)
Calcium: 9.3 mg/dL (ref 8.9–10.3)
Chloride: 90 mmol/L — ABNORMAL LOW (ref 98–111)
Creatinine, Ser: 2.02 mg/dL — ABNORMAL HIGH (ref 0.61–1.24)
GFR calc Af Amer: 35 mL/min — ABNORMAL LOW (ref >60)
GFR calc non Af Amer: 30 mL/min — ABNORMAL LOW (ref >60)
Glucose, Bld: 119 mg/dL — ABNORMAL HIGH (ref 70–99)
Potassium: 3.9 mmol/L (ref 3.5–5.1)
Sodium: 129 mmol/L — ABNORMAL LOW (ref 135–145)
Total Bilirubin: 1.2 mg/dL (ref 0.3–1.2)
Total Protein: 7.3 g/dL (ref 6.5–8.1)

## 2018-08-19 LAB — T4, FREE: Free T4: 1.04 ng/dL (ref 0.82–1.77)

## 2018-08-19 LAB — CBC
HCT: 37.5 % — ABNORMAL LOW (ref 39.0–52.0)
Hemoglobin: 12.9 g/dL — ABNORMAL LOW (ref 13.0–17.0)
MCH: 36.6 pg — ABNORMAL HIGH (ref 26.0–34.0)
MCHC: 34.4 g/dL (ref 30.0–36.0)
MCV: 106.5 fL — ABNORMAL HIGH (ref 80.0–100.0)
Platelets: 134 10*3/uL — ABNORMAL LOW (ref 150–400)
RBC: 3.52 MIL/uL — ABNORMAL LOW (ref 4.22–5.81)
RDW: 17.5 % — ABNORMAL HIGH (ref 11.5–15.5)
WBC: 6.2 10*3/uL (ref 4.0–10.5)
nRBC: 0 % (ref 0.0–0.2)

## 2018-08-19 LAB — TSH: TSH: 7.734 u[IU]/mL — ABNORMAL HIGH (ref 0.350–4.500)

## 2018-08-19 LAB — MAGNESIUM: Magnesium: 2.3 mg/dL (ref 1.7–2.4)

## 2018-08-19 LAB — POCT INR: INR: 3.5 — AB (ref 2.0–3.0)

## 2018-08-19 MED ORDER — CARVEDILOL 3.125 MG PO TABS: 3.1250 mg | ORAL_TABLET | Freq: Two times a day (BID) | ORAL | 6 refills | Status: DC

## 2018-08-19 MED ORDER — TORSEMIDE 20 MG PO TABS: 80.0000 mg | ORAL_TABLET | Freq: Two times a day (BID) | ORAL | 11 refills | Status: DC

## 2018-08-19 MED ORDER — ALLOPURINOL 100 MG PO TABS: ORAL_TABLET | ORAL | 6 refills | Status: DC

## 2018-08-19 NOTE — Patient Instructions (Addendum)
Description   Spoke with pt and instructed pt to continue taking 1.5 tablets daily except 1 tablet on Tuesdays, and Thursdays.  Encouraged him to [place] a serving of dark green leafy vegetables or drink a Priemer Protein shake. (contains 25% RDV of Vitamin K). Recheck INR in 2 weeks. Call coumadin clinic for any questions [phone].

## 2018-08-19 NOTE — Addendum Note (Signed)
Encounter addended by: Scarlette Calico, RN on: 08/19/2018 11:16 AM  Actions taken: Order list changed, Charge Capture section accepted

## 2018-08-19 NOTE — Telephone Encounter (Signed)
Spoke to pt to make med changes. Pt verbalized understanding and no further questions at this time. F/u made.

## 2018-08-19 NOTE — Patient Instructions (Signed)
Start Carvedilol 3.125 mg Twice daily   Labs today, we will notify you if they are abnormal  Your physician recommends that you schedule a follow-up appointment in: 3-4 months with [name]

## 2018-08-20 DIAGNOSIS — I340 Nonrheumatic mitral (valve) insufficiency: Secondary | ICD-10-CM | POA: Diagnosis not present

## 2018-08-20 DIAGNOSIS — I5022 Chronic systolic (congestive) heart failure: Secondary | ICD-10-CM | POA: Diagnosis not present

## 2018-08-20 DIAGNOSIS — Z952 Presence of prosthetic heart valve: Secondary | ICD-10-CM | POA: Diagnosis not present

## 2018-08-20 LAB — T3, FREE: T3, Free: 2.4 pg/mL (ref 2.0–4.4)

## 2018-09-02 ENCOUNTER — Ambulatory Visit (HOSPITAL_COMMUNITY)
Admission: RE | Admit: 2018-09-02 | Discharge: 2018-09-02 | Disposition: A | Discharge status: Home / Self Care | Payer: Medicare Other | Source: Ambulatory Visit | Attending: Internal Medicine | Admitting: Internal Medicine

## 2018-09-02 ENCOUNTER — Other Ambulatory Visit: Payer: Self-pay

## 2018-09-02 DIAGNOSIS — I5032 Chronic diastolic (congestive) heart failure: Secondary | ICD-10-CM

## 2018-09-02 LAB — BASIC METABOLIC PANEL
Anion gap: 12 (ref 5–15)
BUN: 61 mg/dL — ABNORMAL HIGH (ref 8–23)
CO2: 25 mmol/L (ref 22–32)
Calcium: 9.2 mg/dL (ref 8.9–10.3)
Chloride: 95 mmol/L — ABNORMAL LOW (ref 98–111)
Creatinine, Ser: 1.79 mg/dL — ABNORMAL HIGH (ref 0.61–1.24)
GFR calc Af Amer: 40 mL/min — ABNORMAL LOW (ref >60)
GFR calc non Af Amer: 35 mL/min — ABNORMAL LOW (ref >60)
Glucose, Bld: 105 mg/dL — ABNORMAL HIGH (ref 70–99)
Potassium: 4.7 mmol/L (ref 3.5–5.1)
Sodium: 132 mmol/L — ABNORMAL LOW (ref 135–145)

## 2018-09-04 ENCOUNTER — Telehealth: Payer: Self-pay

## 2018-09-04 NOTE — Telephone Encounter (Signed)

## 2018-09-09 ENCOUNTER — Ambulatory Visit (INDEPENDENT_AMBULATORY_CARE_PROVIDER_SITE_OTHER): Payer: Medicare Other | Admitting: *Deleted

## 2018-09-09 ENCOUNTER — Other Ambulatory Visit: Payer: Self-pay

## 2018-09-09 DIAGNOSIS — I4891 Unspecified atrial fibrillation: Secondary | ICD-10-CM | POA: Diagnosis not present

## 2018-09-09 DIAGNOSIS — Z952 Presence of prosthetic heart valve: Secondary | ICD-10-CM

## 2018-09-09 DIAGNOSIS — Z7189 Other specified counseling: Secondary | ICD-10-CM

## 2018-09-09 LAB — POCT INR: INR: 3.8 — AB (ref 2.0–3.0)

## 2018-09-09 NOTE — Patient Instructions (Addendum)
Description   Do not take Coumadin today then continue taking 1.5 tablets daily except 1 tablet on Tuesdays, and Thursdays.  Encouraged him to least a serving of dark green leafy vegetables or drink a Priemer Protein shake. (contains 25% RDV of Vitamin K). Recheck INR in 2 weeks. Call coumadin clinic for any questions [phone].

## 2018-09-15 ENCOUNTER — Other Ambulatory Visit (HOSPITAL_COMMUNITY): Payer: Self-pay | Admitting: Cardiology

## 2018-09-16 ENCOUNTER — Telehealth: Payer: Self-pay

## 2018-09-16 NOTE — Telephone Encounter (Signed)

## 2018-09-18 ENCOUNTER — Other Ambulatory Visit: Payer: Self-pay | Admitting: Internal Medicine

## 2018-09-23 ENCOUNTER — Other Ambulatory Visit: Payer: Self-pay

## 2018-09-23 ENCOUNTER — Ambulatory Visit (INDEPENDENT_AMBULATORY_CARE_PROVIDER_SITE_OTHER): Payer: Medicare Other | Admitting: Pharmacist

## 2018-09-23 DIAGNOSIS — Z952 Presence of prosthetic heart valve: Secondary | ICD-10-CM

## 2018-09-23 DIAGNOSIS — I4891 Unspecified atrial fibrillation: Secondary | ICD-10-CM | POA: Diagnosis not present

## 2018-09-23 LAB — POCT INR: INR: 4.1 — AB (ref 2.0–3.0)

## 2018-09-23 NOTE — Patient Instructions (Signed)
Do not take Coumadin today then start taking 1.5 tablets daily except 1 tablet on Tuesdays, Thursdays and saturdays.  Encouraged him to least a serving of dark green leafy vegetables or drink a Priemer Protein shake. (contains 25% RDV of Vitamin K). Recheck INR in 2 weeks. Call coumadin clinic for any questions [phone].

## 2018-09-24 ENCOUNTER — Ambulatory Visit (INDEPENDENT_AMBULATORY_CARE_PROVIDER_SITE_OTHER): Payer: Medicare Other | Admitting: Internal Medicine

## 2018-09-24 ENCOUNTER — Encounter: Payer: Self-pay | Admitting: Internal Medicine

## 2018-09-24 ENCOUNTER — Telehealth: Payer: Self-pay | Admitting: Cardiology

## 2018-09-24 ENCOUNTER — Other Ambulatory Visit (INDEPENDENT_AMBULATORY_CARE_PROVIDER_SITE_OTHER): Payer: Medicare Other

## 2018-09-24 VITALS — BP 120/66 | HR 57 | Temp 97.6°F | Ht 74.0 in | Wt 214.0 lb

## 2018-09-24 DIAGNOSIS — E032 Hypothyroidism due to medicaments and other exogenous substances: Secondary | ICD-10-CM

## 2018-09-24 DIAGNOSIS — I5032 Chronic diastolic (congestive) heart failure: Secondary | ICD-10-CM | POA: Diagnosis not present

## 2018-09-24 DIAGNOSIS — E039 Hypothyroidism, unspecified: Secondary | ICD-10-CM | POA: Insufficient documentation

## 2018-09-24 DIAGNOSIS — R300 Dysuria: Secondary | ICD-10-CM

## 2018-09-24 DIAGNOSIS — I252 Old myocardial infarction: Secondary | ICD-10-CM | POA: Diagnosis not present

## 2018-09-24 DIAGNOSIS — N320 Bladder-neck obstruction: Secondary | ICD-10-CM

## 2018-09-24 DIAGNOSIS — I4819 Other persistent atrial fibrillation: Secondary | ICD-10-CM | POA: Diagnosis not present

## 2018-09-24 DIAGNOSIS — I2510 Atherosclerotic heart disease of native coronary artery without angina pectoris: Secondary | ICD-10-CM | POA: Diagnosis not present

## 2018-09-24 DIAGNOSIS — Z7901 Long term (current) use of anticoagulants: Secondary | ICD-10-CM | POA: Diagnosis not present

## 2018-09-24 DIAGNOSIS — D61818 Other pancytopenia: Secondary | ICD-10-CM

## 2018-09-24 DIAGNOSIS — R0902 Hypoxemia: Secondary | ICD-10-CM | POA: Diagnosis not present

## 2018-09-24 DIAGNOSIS — Z5181 Encounter for therapeutic drug level monitoring: Secondary | ICD-10-CM | POA: Diagnosis not present

## 2018-09-24 LAB — BASIC METABOLIC PANEL
BUN: 73 mg/dL — ABNORMAL HIGH (ref 6–23)
CO2: 29 mEq/L (ref 19–32)
Calcium: 9.4 mg/dL (ref 8.4–10.5)
Chloride: 93 mEq/L — ABNORMAL LOW (ref 96–112)
Creatinine, Ser: 1.8 mg/dL — ABNORMAL HIGH (ref 0.40–1.50)
GFR: 36.35 mL/min — ABNORMAL LOW (ref >60.00)
Glucose, Bld: 126 mg/dL — ABNORMAL HIGH (ref 70–99)
Potassium: 4.1 mEq/L (ref 3.5–5.1)
Sodium: 133 mEq/L — ABNORMAL LOW (ref 135–145)

## 2018-09-24 LAB — CBC WITH DIFFERENTIAL/PLATELET
Basophils Absolute: 0 10*3/uL (ref 0.0–0.1)
Basophils Relative: 0.7 % (ref 0.0–3.0)
Eosinophils Absolute: 0.1 10*3/uL (ref 0.0–0.7)
Eosinophils Relative: 2.1 % (ref 0.0–5.0)
HCT: 37.9 % — ABNORMAL LOW (ref 39.0–52.0)
Hemoglobin: 12.9 g/dL — ABNORMAL LOW (ref 13.0–17.0)
Lymphocytes Relative: 18.8 % (ref 12.0–46.0)
Lymphs Abs: 0.9 10*3/uL (ref 0.7–4.0)
MCHC: 34.2 g/dL (ref 30.0–36.0)
MCV: 111.5 fl — ABNORMAL HIGH (ref 78.0–100.0)
Monocytes Absolute: 0.5 10*3/uL (ref 0.1–1.0)
Monocytes Relative: 11.4 % (ref 3.0–12.0)
Neutro Abs: 3.2 10*3/uL (ref 1.4–7.7)
Neutrophils Relative %: 67 % (ref 43.0–77.0)
Platelets: 131 10*3/uL — ABNORMAL LOW (ref 150.0–400.0)
RBC: 3.4 Mil/uL — ABNORMAL LOW (ref 4.22–5.81)
RDW: 17.3 % — ABNORMAL HIGH (ref 11.5–15.5)
WBC: 4.7 10*3/uL (ref 4.0–10.5)

## 2018-09-24 LAB — T4, FREE: Free T4: 1.12 ng/dL (ref 0.60–1.60)

## 2018-09-24 LAB — URINALYSIS, ROUTINE W REFLEX MICROSCOPIC
Bilirubin Urine: NEGATIVE
Ketones, ur: NEGATIVE
Nitrite: NEGATIVE
Specific Gravity, Urine: 1.01 (ref 1.000–1.030)
Total Protein, Urine: NEGATIVE
Urine Glucose: NEGATIVE
Urobilinogen, UA: 0.2 (ref 0.0–1.0)
pH: 6 (ref 5.0–8.0)

## 2018-09-24 LAB — PSA: PSA: 2.72 ng/mL (ref 0.10–4.00)

## 2018-09-24 LAB — TSH: TSH: 8.24 u[IU]/mL — ABNORMAL HIGH (ref 0.35–4.50)

## 2018-09-24 NOTE — Assessment & Plan Note (Signed)
Demadex, Spironolactone

## 2018-09-24 NOTE — Assessment & Plan Note (Signed)
Pacerone Coumadin Diuretics

## 2018-09-24 NOTE — Assessment & Plan Note (Signed)
Coumadin 

## 2018-09-24 NOTE — Assessment & Plan Note (Signed)
Due to amiodarone 2020 On Levothroid

## 2018-09-24 NOTE — Assessment & Plan Note (Addendum)
X 3 weeks UA  Urol ref

## 2018-09-24 NOTE — Assessment & Plan Note (Signed)
Off O2

## 2018-09-24 NOTE — Assessment & Plan Note (Signed)
Labs

## 2018-09-24 NOTE — Progress Notes (Signed)
Subjective:  Patient ID: [name], male    DOB: 1935/07/11  Age: 83 y.o. MRN: [id number]  CC: No chief complaint on file.   HPI Anton Blaney presents for dysuria, CHF, A fib, hypothyroidism  Outpatient Medications Prior to Visit  Medication Sig Dispense Refill  . acetaminophen (TYLENOL) 500 MG tablet Take 1,000 mg by mouth every 6 (six) hours as needed for moderate pain or headache.    . allopurinol (ZYLOPRIM) 100 MG tablet Take 1 tablet daily 30 tablet 6  . amiodarone (PACERONE) 200 MG tablet Take 1 tablet (200 mg total) by mouth daily. 90 tablet 3  . carvedilol (COREG) 3.125 MG tablet Take 1 tablet (3.125 mg total) by mouth 2 (two) times daily. 60 tablet 6  . ezetimibe-simvastatin (VYTORIN) 10-20 MG tablet TAKE 1 TABLET BY MOUTH EVERYDAY AT BEDTIME 90 tablet 0  . levothyroxine (SYNTHROID, LEVOTHROID) 50 MCG tablet Take 1 tablet (50 mcg total) by mouth daily before breakfast. (Patient taking differently: Take 100 mcg by mouth daily before breakfast. ) 90 tablet 3  . metFORMIN (GLUCOPHAGE) 500 MG tablet Take 750 mg by mouth daily with breakfast.     . metolazone (ZAROXOLYN) 2.5 MG tablet Take 2.5 mg by mouth 2 (two) times a week. Every Monday and thursday    . polyethylene glycol powder (GLYCOLAX/MIRALAX) powder Take 17 g by mouth daily as needed for moderate constipation.    . potassium chloride SA (KLOR-CON M20) 20 MEQ tablet TAKE 3 TABS EVERY MORNING, 2 TABS AT NOON AND 2 TABS EVERY EVENING 630 tablet 1  . spironolactone (ALDACTONE) 25 MG tablet Take 25 mg by mouth daily.    Marland Kitchen torsemide (DEMADEX) 20 MG tablet Take 4 tablets (80 mg total) by mouth 2 (two) times daily. 240 tablet 11  . warfarin (COUMADIN) 5 MG tablet Take 1.5 tabs daily except 1 tab on Tues and Thurs or as directed by Coumadin Clinic. 150 tablet 0   No facility-administered medications prior to visit.     ROS: Review of Systems  Constitutional: Negative for appetite change, fatigue and unexpected weight change.   HENT: Negative for congestion, nosebleeds, sneezing, sore throat and trouble swallowing.   Eyes: Negative for itching and visual disturbance.  Respiratory: Negative for cough.   Cardiovascular: Negative for chest pain, palpitations and leg swelling.  Gastrointestinal: Negative for abdominal distention, blood in stool, diarrhea and nausea.  Genitourinary: Negative for frequency and hematuria.  Musculoskeletal: Positive for gait problem. Negative for back pain, joint swelling and neck pain.  Skin: Negative for rash.  Neurological: Negative for dizziness, tremors, speech difficulty and weakness.  Hematological: Bruises/bleeds easily.  Psychiatric/Behavioral: Negative for agitation, dysphoric mood and sleep disturbance. The patient is not nervous/anxious.     Objective:  BP 120/66 (BP Location: Right Arm, Patient Position: Sitting, Cuff Size: Large)   Pulse (!) 57   Temp 97.6 F (36.4 C) (Oral)   Ht 6\' 2"  (1.88 m)   Wt 214 lb (97.1 kg)   SpO2 96%   BMI 27.48 kg/m   BP Readings from Last 3 Encounters:  09/24/18 120/66  08/19/18 120/62  05/27/18 126/60    Wt Readings from Last 3 Encounters:  09/24/18 214 lb (97.1 kg)  08/19/18 213 lb (96.6 kg)  05/27/18 217 lb 6.4 oz (98.6 kg)    Physical Exam Constitutional:      General: He is not in acute distress.    Appearance: He is well-developed.     Comments: NAD  Eyes:  Conjunctiva/sclera: Conjunctivae normal.     Pupils: Pupils are equal, round, and reactive to light.  Neck:     Musculoskeletal: Normal range of motion.     Thyroid: No thyromegaly.     Vascular: No JVD.  Cardiovascular:     Rate and Rhythm: Normal rate. Rhythm irregular.     Heart sounds: Normal heart sounds. No murmur. No friction rub. No gallop.   Pulmonary:     Effort: Pulmonary effort is normal. No respiratory distress.     Breath sounds: Normal breath sounds. No wheezing or rales.  Chest:     Chest wall: No tenderness.  Abdominal:     General:  Bowel sounds are normal. There is no distension.     Palpations: Abdomen is soft. There is no mass.     Tenderness: There is no abdominal tenderness. There is no guarding or rebound.  Musculoskeletal: Normal range of motion.        General: No tenderness.  Lymphadenopathy:     Cervical: No cervical adenopathy.  Skin:    General: Skin is warm and dry.     Findings: No rash.  Neurological:     Mental Status: He is alert and oriented to person, place, and time.     Cranial Nerves: No cranial nerve deficit.     Motor: No abnormal muscle tone.     Coordination: Coordination normal.     Gait: Gait normal.     Deep Tendon Reflexes: Reflexes are normal and symmetric.  Psychiatric:        Behavior: Behavior normal.        Thought Content: Thought content normal.        Judgment: Judgment normal.   bruises Cane  Lab Results  Component Value Date   WBC 6.2 08/19/2018   HGB 12.9 (L) 08/19/2018   HCT 37.5 (L) 08/19/2018   PLT 134 (L) 08/19/2018   GLUCOSE 105 (H) 09/02/2018   CHOL 129 05/27/2018   TRIG 69 05/27/2018   HDL 51 05/27/2018   LDLCALC 64 05/27/2018   ALT 16 08/19/2018   AST 20 08/19/2018   NA 132 (L) 09/02/2018   K 4.7 09/02/2018   CL 95 (L) 09/02/2018   CREATININE 1.79 (H) 09/02/2018   BUN 61 (H) 09/02/2018   CO2 25 09/02/2018   TSH 7.734 (H) 08/19/2018   INR 4.1 (A) 09/23/2018   HGBA1C 5.7 07/01/2018   MICROALBUR 6.4 (H) 10/15/2017    No results found.  Assessment & Plan:   Walker Kehr, MD

## 2018-09-24 NOTE — Telephone Encounter (Signed)
Rina from [facility] called stating patient needs appt with Dr. Radford Pax in order to get replacement BiPAP machine.

## 2018-09-25 ENCOUNTER — Other Ambulatory Visit: Payer: Self-pay | Admitting: Internal Medicine

## 2018-09-25 MED ORDER — CIPROFLOXACIN HCL 250 MG PO TABS: 250.0000 mg | ORAL_TABLET | Freq: Two times a day (BID) | ORAL | 0 refills | Status: DC

## 2018-09-25 NOTE — Telephone Encounter (Signed)
Patient as an office visit appointment on 10/22/18 with [facility] to get an order for a replacement Bipap.

## 2018-10-01 ENCOUNTER — Telehealth: Payer: Self-pay

## 2018-10-01 NOTE — Telephone Encounter (Signed)

## 2018-10-08 ENCOUNTER — Other Ambulatory Visit: Payer: Self-pay

## 2018-10-08 ENCOUNTER — Ambulatory Visit (INDEPENDENT_AMBULATORY_CARE_PROVIDER_SITE_OTHER): Payer: Medicare Other | Admitting: *Deleted

## 2018-10-08 DIAGNOSIS — I4891 Unspecified atrial fibrillation: Secondary | ICD-10-CM

## 2018-10-08 DIAGNOSIS — Z952 Presence of prosthetic heart valve: Secondary | ICD-10-CM | POA: Diagnosis not present

## 2018-10-08 LAB — POCT INR: INR: 2.6 (ref 2.0–3.0)

## 2018-10-08 NOTE — Patient Instructions (Signed)
Description   Continue taking 1.5 tablets daily except 1 tablet on Tuesdays, Thursdays and saturdays. Recheck INR in 3 weeks. Call coumadin clinic for any questions [phone].

## 2018-10-09 ENCOUNTER — Telehealth: Payer: Self-pay | Admitting: Cardiology

## 2018-10-09 NOTE — Telephone Encounter (Signed)
New message:     Santiago Glad from [facility], calling concerning a order for the patient concering a   c-pac machine.

## 2018-10-10 ENCOUNTER — Encounter: Payer: Self-pay | Admitting: Thoracic Surgery (Cardiothoracic Vascular Surgery)

## 2018-10-16 NOTE — Telephone Encounter (Signed)
[facility] needs the chart notes from his 7/21 office visit sent.

## 2018-10-17 ENCOUNTER — Other Ambulatory Visit: Payer: Self-pay | Admitting: Interventional Cardiology

## 2018-10-22 ENCOUNTER — Telehealth (INDEPENDENT_AMBULATORY_CARE_PROVIDER_SITE_OTHER): Payer: Medicare Other | Admitting: Cardiology

## 2018-10-22 DIAGNOSIS — I50812 Chronic right heart failure: Secondary | ICD-10-CM | POA: Diagnosis not present

## 2018-10-22 DIAGNOSIS — I2510 Atherosclerotic heart disease of native coronary artery without angina pectoris: Secondary | ICD-10-CM

## 2018-10-22 DIAGNOSIS — I252 Old myocardial infarction: Secondary | ICD-10-CM | POA: Diagnosis not present

## 2018-10-22 DIAGNOSIS — I4891 Unspecified atrial fibrillation: Secondary | ICD-10-CM | POA: Diagnosis not present

## 2018-10-22 DIAGNOSIS — Z952 Presence of prosthetic heart valve: Secondary | ICD-10-CM

## 2018-10-22 DIAGNOSIS — I5032 Chronic diastolic (congestive) heart failure: Secondary | ICD-10-CM | POA: Diagnosis not present

## 2018-10-22 NOTE — Progress Notes (Signed)
Virtual Visit via Telephone Note   This visit type was conducted due to national recommendations for restrictions regarding the COVID-19 Pandemic (e.g. social distancing) in an effort to limit this patient's exposure and mitigate transmission in our community.  Due to his co-morbid illnesses, this patient is at least at moderate risk for complications without adequate follow up.  This format is felt to be most appropriate for this patient at this time.  The patient did not have access to video technology/had technical difficulties with video requiring transitioning to audio format only (telephone).  All issues noted in this document were discussed and addressed.  No physical exam could be performed with this format.  Please refer to the patient's chart for his  consent to telehealth for [facility].   Evaluation Performed:  Follow-up visit  This visit type was conducted due to national recommendations for restrictions regarding the COVID-19 Pandemic (e.g. social distancing).  This format is felt to be most appropriate for this patient at this time.  All issues noted in this document were discussed and addressed.  No physical exam was performed (except for noted visual exam findings with Video Visits).  Please refer to the patient's chart (MyChart message for video visits and phone note for telephone visits) for the patient's consent to telehealth for [facility].  Date:  10/22/2018   ID:  [name], DOB Feb 04, 1936, MRN [id number]  Patient Location:  Home  Provider location:   [place]  PCP:  Cassandria Anger, MD  Cardiologist:  [name], MD  Sleep Medicine:  Fransico Him, MD Electrophysiologist:  None   Chief Complaint:  OSA  History of Present Illness:    [name] is a 83 y.o. male who presents via audio/video conferencing for a telehealth visit today.    [name] is a 83 y.o. male with a hx of OSAon PAP, obesity and HTN.  When I last saw him he was  complainng of his device not holding water and given that his device was old requested that we order a new PAP device.  Unfortunately he has had problems with the DME in getting a new PCP device.  He feels better when he is using his PAP but has had problems with nose bleeds and his nose gets very dry because the H2O container does not work correctly.    The patient does not have symptoms concerning for COVID-19 infection (fever, chills, cough, or new shortness of breath).    Prior CV studies:   The following studies were reviewed today:  none  Past Medical History:  Diagnosis Date  . Carotid artery disease ([place]) 1994   s/p left carotid endarerectomy   . Chronic atrial fibrillation    a. on coumadin   . Chronic diastolic CHF (congestive heart failure) ([place])   . Diabetes mellitus without complication ([place])    dx 2016  . Dyspnea   . Epistaxis   . Heart murmur   . Hx of CABG    a. 1994  . Hypertension   . OSA (obstructive sleep apnea)   . Rheumatic fever   . S/P AVR (aortic valve replacement)    a. mechanical valve 1996  . Subclavian bypass stenosis ([place])   . 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 Vitamin B12 deficiency    Past Surgical History:  Procedure Laterality Date  . artificial valve    . CARDIAC VALVE SURGERY  1994   replaced due to aortic stenosis, [employer] mechanical prostesis  . CAROTID ENDARTERECTOMY Left 1997   subclavian bypass Done in [place]  . CORONARY ARTERY BYPASS GRAFT  1994   w SVG to RCA and SVG to circumflex  . heart bypass     Done in [place]  . MULTIPLE EXTRACTIONS WITH ALVEOLOPLASTY Bilateral 12/12/2017   Procedure: Extraction of tooth #'s 1, 12,13,14,15,17,18,19, 29, and 30 with alveoloplasty and gross debridement of remaining teeth`;  Surgeon: Lenn Cal, DDS;  Location: [place];  Service: Oral Surgery;  Laterality: Bilateral;  MULTIPLE EXTRACTION WITH ALVEOLOPLASTY WITH PRE  PROSTHETIC SURGERY AND GROSS DEBRIDEMENT OF REMAINING TEETH  . RIGHT HEART CATH AND CORONARY/GRAFT ANGIOGRAPHY N/A 04/30/2018   Procedure: RIGHT HEART CATH AND CORONARY/GRAFT ANGIOGRAPHY;  Surgeon: Larey Dresser, MD;  Location: [place] [facility];  Service: Cardiovascular;  Laterality: N/A;  . TEE WITHOUT CARDIOVERSION N/A 10/23/2017   Procedure: TRANSESOPHAGEAL ECHOCARDIOGRAM (TEE);  Surgeon: Larey Dresser, MD;  Location: [facility] ENDOSCOPY;  Service: Cardiovascular;  Laterality: N/A;  . TONSILLECTOMY       Current Meds  Medication Sig  . acetaminophen (TYLENOL) 500 MG tablet Take 1,000 mg by mouth every 6 (six) hours as needed for moderate pain or headache.  . allopurinol (ZYLOPRIM) 100 MG tablet Take 1 tablet daily  . amiodarone (PACERONE) 200 MG tablet Take 1 tablet (200 mg total) by mouth daily.  . carvedilol (COREG) 3.125 MG tablet Take 1 tablet (3.125 mg total) by mouth 2 (two) times daily.  Marland Kitchen ezetimibe-simvastatin (VYTORIN) 10-20 MG tablet TAKE 1 TABLET BY MOUTH EVERYDAY AT BEDTIME  . levothyroxine (SYNTHROID, LEVOTHROID) 50 MCG tablet Take 1 tablet (50 mcg total) by mouth daily before breakfast. (Patient taking differently: Take 100 mcg by mouth daily before breakfast. )  . metFORMIN (GLUCOPHAGE) 500 MG tablet Take 750 mg by mouth daily with breakfast.   . metolazone (ZAROXOLYN) 2.5 MG tablet Take 2.5 mg by mouth 2 (two) times a week. Every Monday and thursday  . polyethylene glycol powder (GLYCOLAX/MIRALAX) powder Take 17 g by mouth daily as needed for moderate constipation.  . potassium chloride SA (KLOR-CON M20) 20 MEQ tablet TAKE 3 TABS EVERY MORNING, 2 TABS AT NOON AND 2 TABS EVERY EVENING  . spironolactone (ALDACTONE) 25 MG tablet Take 25 mg by mouth daily.  Marland Kitchen torsemide (DEMADEX) 20 MG tablet Take 4 tablets (80 mg total) by mouth 2 (two) times daily.  Marland Kitchen warfarin (COUMADIN) 5 MG tablet TAKE 1 & 1/2 TABS DAILY EXCEPT 1 TAB ON TUES AND THURS OR AS DIRECTED BY COUMADIN CLINIC.      Allergies:   Rifampin and Vancomycin   Social History   Tobacco Use  . Smoking status: Former Smoker    Packs/day: 1.00    Years: 30.00    Pack years: 30.00    Types: Cigarettes    Quit date: 04/03/1992    Years since quitting: 26.5  . Smokeless tobacco: Never Used  Substance Use Topics  . Alcohol use: Yes    Comment: 4-6 ounces of wine daily  . Drug use: No    Comment: Half a cup a day.     Family Hx: The patient's family history includes Cancer in his father; Hypertension in his mother.  ROS:   Please see the history of present illness.     All other systems reviewed and are negative.   Labs/Other Tests and Data Reviewed:    Recent Labs: 08/19/2018: ALT 16;  Magnesium 2.3 09/24/2018: BUN 73; Creatinine, Ser 1.80; Hemoglobin 12.9; Platelets 131.0; Potassium 4.1; Sodium 133; TSH 8.24   Recent Lipid Panel Lab Results  Component Value Date/Time   CHOL 129 05/27/2018 03:30 PM   TRIG 69 05/27/2018 03:30 PM   HDL 51 05/27/2018 03:30 PM   CHOLHDL 2.5 05/27/2018 03:30 PM   LDLCALC 64 05/27/2018 03:30 PM    Wt Readings from Last 3 Encounters:  09/24/18 214 lb (97.1 kg)  08/19/18 213 lb (96.6 kg)  05/27/18 217 lb 6.4 oz (98.6 kg)     Objective:    Vital Signs:  There were no vitals taken for this visit.    ASSESSMENT & PLAN:    1.  OSA - the patient is tolerating PAP therapy well without any problems.   The patient has been using and benefiting from PAP use and will continue to benefit from therapy.  -I will order a new BiPAP device on Auto due to his machine not working right at humidifying the air -he will need to see me back 10 weeks after getting the device  2.  Hypertension -BP at home runs 100-110/83mmHg -continue on carvedilol 3.125mg  BID and spiro 25mg  daily  3.  Obesity - I have encouraged him to get into a routine exercise program and cut back on carbs and portions.     COVID-19 Education: The signs and symptoms of COVID-19 were discussed with the  patient and how to seek care for testing (follow up with PCP or arrange E-visit).  The importance of social distancing was discussed today.  Patient Risk:   After full review of this patient's clinical status, I feel that they are at least moderate risk at this time.  Time:   Today, I have spent 15 minutes directly with the patient on telephone discussing medical problems including OSA, HTN.  We also reviewed the symptoms of COVID 19 and the ways to protect against contracting the virus with telehealth technology.  I spent an additional 5 minutes reviewing patient's chart including office notes.  Medication Adjustments/Labs and Tests Ordered: Current medicines are reviewed at length with the patient today.  Concerns regarding medicines are outlined above.  Tests Ordered: No orders of the defined types were placed in this encounter.  Medication Changes: No orders of the defined types were placed in this encounter.   Disposition:  Follow up in 10 week(s) virtual  Signed, Fransico Him, MD  10/22/2018 8:39 PM    [employer]

## 2018-10-23 ENCOUNTER — Telehealth: Payer: Self-pay | Admitting: *Deleted

## 2018-10-23 DIAGNOSIS — G4733 Obstructive sleep apnea (adult) (pediatric): Secondary | ICD-10-CM

## 2018-10-23 NOTE — Telephone Encounter (Signed)
Notes sent today to [facility].

## 2018-10-23 NOTE — Telephone Encounter (Addendum)
order placed to [facility] today

## 2018-10-23 NOTE — Telephone Encounter (Signed)
-----   Message from Dollene Primrose, RN sent at 10/22/2018  9:09 AM EDT ----- Regarding: BiPap order from Dr. Brett Albino,  Dr. Radford Pax asked I forward this to you to follow up on with the pt. I have already scheduled his follow up appt in 10 weeks.   Thanks!  Lor[place]  ----- Message ----- From: Sueanne Margarita, MD Sent: 10/22/2018   9:00 AM EDT To: Dollene Primrose, RN  Order ResMed BiPAP on auto with IPAP max 20cm H2O, EPAP min 6cm H2O and PS 5cm H2O.  Get a download in 2 weeks and follwoup with me in 10 weeks for OV >>>Just send this to Gershon Cull and she will take care of all of this

## 2018-10-24 ENCOUNTER — Telehealth: Payer: Self-pay

## 2018-10-24 NOTE — Telephone Encounter (Signed)
Spoke with pt regarding covid-19 screening prior to appt. Pt stated he has not been in contact with anyone who may have covid-19 and has no symptoms.

## 2018-10-29 ENCOUNTER — Ambulatory Visit (INDEPENDENT_AMBULATORY_CARE_PROVIDER_SITE_OTHER): Payer: Medicare Other | Admitting: *Deleted

## 2018-10-29 ENCOUNTER — Other Ambulatory Visit: Payer: Self-pay

## 2018-10-29 DIAGNOSIS — Z5181 Encounter for therapeutic drug level monitoring: Secondary | ICD-10-CM | POA: Diagnosis not present

## 2018-10-29 DIAGNOSIS — Z952 Presence of prosthetic heart valve: Secondary | ICD-10-CM | POA: Diagnosis not present

## 2018-10-29 DIAGNOSIS — I4891 Unspecified atrial fibrillation: Secondary | ICD-10-CM

## 2018-10-29 LAB — POCT INR: INR: 2.6 (ref 2.0–3.0)

## 2018-10-29 NOTE — Patient Instructions (Signed)
Description   Continue taking 1.5 tablets daily except 1 tablet on Tuesdays, Thursdays, and Saturdays. Recheck INR in 4 weeks. Call coumadin clinic for any questions [phone].

## 2018-11-12 DIAGNOSIS — S8002XA Contusion of left knee, initial encounter: Secondary | ICD-10-CM | POA: Diagnosis not present

## 2018-11-12 DIAGNOSIS — M25562 Pain in left knee: Secondary | ICD-10-CM | POA: Diagnosis not present

## 2018-11-13 ENCOUNTER — Other Ambulatory Visit: Payer: Self-pay | Admitting: Internal Medicine

## 2018-11-20 DIAGNOSIS — N281 Cyst of kidney, acquired: Secondary | ICD-10-CM | POA: Diagnosis not present

## 2018-11-20 DIAGNOSIS — N401 Enlarged prostate with lower urinary tract symptoms: Secondary | ICD-10-CM | POA: Diagnosis not present

## 2018-11-20 DIAGNOSIS — R3914 Feeling of incomplete bladder emptying: Secondary | ICD-10-CM | POA: Diagnosis not present

## 2018-11-20 DIAGNOSIS — R3912 Poor urinary stream: Secondary | ICD-10-CM | POA: Diagnosis not present

## 2018-11-26 ENCOUNTER — Ambulatory Visit (INDEPENDENT_AMBULATORY_CARE_PROVIDER_SITE_OTHER): Payer: Medicare Other | Admitting: *Deleted

## 2018-11-26 ENCOUNTER — Other Ambulatory Visit: Payer: Self-pay

## 2018-11-26 DIAGNOSIS — Z952 Presence of prosthetic heart valve: Secondary | ICD-10-CM | POA: Diagnosis not present

## 2018-11-26 DIAGNOSIS — Z5181 Encounter for therapeutic drug level monitoring: Secondary | ICD-10-CM | POA: Diagnosis not present

## 2018-11-26 DIAGNOSIS — I4891 Unspecified atrial fibrillation: Secondary | ICD-10-CM

## 2018-11-26 LAB — POCT INR: INR: 3.3 — AB (ref 2.0–3.0)

## 2018-11-26 NOTE — Patient Instructions (Signed)
Description   Continue taking 1.5 tablets daily except 1 tablet on Tuesdays, Thursdays, and Saturdays. Recheck INR in 6 weeks. Call coumadin clinic for any questions [phone].

## 2018-11-28 DIAGNOSIS — M79662 Pain in left lower leg: Secondary | ICD-10-CM | POA: Diagnosis not present

## 2018-12-02 NOTE — Telephone Encounter (Signed)
Patient has not started using his Bipap yet and he has had it for more than 2 weeks. Patient was encouraged to call [facility] and inform them of his problem.  Upon patient request DME selection is [facility]. Patient understands he will be contacted by [place] to set up his cpap. Patient understands to call if [place] does not contact him with new setup in a timely manner. Patient understands they will be called once confirmation has been received from [facility] that they have received their new machine to schedule 10 week follow up appointment.  [place] notified of new cpap order  Please add to airview Patient was grateful for the call and thanked me.

## 2018-12-03 ENCOUNTER — Encounter (HOSPITAL_COMMUNITY): Payer: Self-pay | Admitting: Cardiology

## 2018-12-03 ENCOUNTER — Ambulatory Visit (HOSPITAL_COMMUNITY)
Admission: RE | Admit: 2018-12-03 | Discharge: 2018-12-03 | Disposition: A | Discharge status: Home / Self Care | Payer: Medicare Other | Source: Ambulatory Visit | Attending: Cardiology | Admitting: Cardiology

## 2018-12-03 ENCOUNTER — Other Ambulatory Visit: Payer: Self-pay

## 2018-12-03 VITALS — BP 132/86 | HR 71 | Wt 222.6 lb

## 2018-12-03 DIAGNOSIS — I480 Paroxysmal atrial fibrillation: Secondary | ICD-10-CM | POA: Diagnosis not present

## 2018-12-03 DIAGNOSIS — E1122 Type 2 diabetes mellitus with diabetic chronic kidney disease: Secondary | ICD-10-CM | POA: Diagnosis not present

## 2018-12-03 DIAGNOSIS — J449 Chronic obstructive pulmonary disease, unspecified: Secondary | ICD-10-CM | POA: Insufficient documentation

## 2018-12-03 DIAGNOSIS — Z8249 Family history of ischemic heart disease and other diseases of the circulatory system: Secondary | ICD-10-CM | POA: Diagnosis not present

## 2018-12-03 DIAGNOSIS — I5032 Chronic diastolic (congestive) heart failure: Secondary | ICD-10-CM

## 2018-12-03 DIAGNOSIS — I052 Rheumatic mitral stenosis with insufficiency: Secondary | ICD-10-CM | POA: Insufficient documentation

## 2018-12-03 DIAGNOSIS — Z87891 Personal history of nicotine dependence: Secondary | ICD-10-CM | POA: Diagnosis not present

## 2018-12-03 DIAGNOSIS — I340 Nonrheumatic mitral (valve) insufficiency: Secondary | ICD-10-CM

## 2018-12-03 DIAGNOSIS — N183 Chronic kidney disease, stage 3 (moderate): Secondary | ICD-10-CM | POA: Diagnosis not present

## 2018-12-03 DIAGNOSIS — E039 Hypothyroidism, unspecified: Secondary | ICD-10-CM | POA: Insufficient documentation

## 2018-12-03 DIAGNOSIS — Z881 Allergy status to other antibiotic agents status: Secondary | ICD-10-CM | POA: Insufficient documentation

## 2018-12-03 DIAGNOSIS — Z951 Presence of aortocoronary bypass graft: Secondary | ICD-10-CM | POA: Diagnosis not present

## 2018-12-03 DIAGNOSIS — I472 Ventricular tachycardia: Secondary | ICD-10-CM | POA: Insufficient documentation

## 2018-12-03 DIAGNOSIS — I6522 Occlusion and stenosis of left carotid artery: Secondary | ICD-10-CM | POA: Insufficient documentation

## 2018-12-03 DIAGNOSIS — Z7984 Long term (current) use of oral hypoglycemic drugs: Secondary | ICD-10-CM | POA: Diagnosis not present

## 2018-12-03 DIAGNOSIS — Z79890 Hormone replacement therapy (postmenopausal): Secondary | ICD-10-CM | POA: Insufficient documentation

## 2018-12-03 DIAGNOSIS — I5042 Chronic combined systolic (congestive) and diastolic (congestive) heart failure: Secondary | ICD-10-CM | POA: Diagnosis not present

## 2018-12-03 DIAGNOSIS — G4733 Obstructive sleep apnea (adult) (pediatric): Secondary | ICD-10-CM | POA: Insufficient documentation

## 2018-12-03 DIAGNOSIS — D696 Thrombocytopenia, unspecified: Secondary | ICD-10-CM | POA: Insufficient documentation

## 2018-12-03 DIAGNOSIS — I4891 Unspecified atrial fibrillation: Secondary | ICD-10-CM | POA: Diagnosis not present

## 2018-12-03 DIAGNOSIS — I482 Chronic atrial fibrillation, unspecified: Secondary | ICD-10-CM | POA: Insufficient documentation

## 2018-12-03 DIAGNOSIS — Z79899 Other long term (current) drug therapy: Secondary | ICD-10-CM | POA: Diagnosis not present

## 2018-12-03 DIAGNOSIS — I25810 Atherosclerosis of coronary artery bypass graft(s) without angina pectoris: Secondary | ICD-10-CM | POA: Insufficient documentation

## 2018-12-03 DIAGNOSIS — Z7901 Long term (current) use of anticoagulants: Secondary | ICD-10-CM | POA: Diagnosis not present

## 2018-12-03 DIAGNOSIS — Z952 Presence of prosthetic heart valve: Secondary | ICD-10-CM | POA: Diagnosis not present

## 2018-12-03 DIAGNOSIS — I50812 Chronic right heart failure: Secondary | ICD-10-CM

## 2018-12-03 DIAGNOSIS — Z801 Family history of malignant neoplasm of trachea, bronchus and lung: Secondary | ICD-10-CM | POA: Diagnosis not present

## 2018-12-03 LAB — COMPREHENSIVE METABOLIC PANEL
ALT: 14 U/L (ref 0–44)
AST: 27 U/L (ref 15–41)
Albumin: 4.1 g/dL (ref 3.5–5.0)
Alkaline Phosphatase: 102 U/L (ref 38–126)
Anion gap: 17 — ABNORMAL HIGH (ref 5–15)
BUN: 79 mg/dL — ABNORMAL HIGH (ref 8–23)
CO2: 26 mmol/L (ref 22–32)
Calcium: 9.6 mg/dL (ref 8.9–10.3)
Chloride: 93 mmol/L — ABNORMAL LOW (ref 98–111)
Creatinine, Ser: 2.05 mg/dL — ABNORMAL HIGH (ref 0.61–1.24)
GFR calc Af Amer: 34 mL/min — ABNORMAL LOW (ref >60)
GFR calc non Af Amer: 30 mL/min — ABNORMAL LOW (ref >60)
Glucose, Bld: 120 mg/dL — ABNORMAL HIGH (ref 70–99)
Potassium: 3.5 mmol/L (ref 3.5–5.1)
Sodium: 136 mmol/L (ref 135–145)
Total Bilirubin: 1.2 mg/dL (ref 0.3–1.2)
Total Protein: 7.7 g/dL (ref 6.5–8.1)

## 2018-12-03 LAB — PROTIME-INR
INR: 3.2 — ABNORMAL HIGH (ref 0.8–1.2)
Prothrombin Time: 31.9 seconds — ABNORMAL HIGH (ref 11.4–15.2)

## 2018-12-03 MED ORDER — TORSEMIDE 20 MG PO TABS: 100.0000 mg | ORAL_TABLET | Freq: Two times a day (BID) | ORAL | 11 refills | Status: DC

## 2018-12-03 NOTE — Patient Instructions (Signed)
Increase Torsemide to 100 mg Twice daily   Labs done today  Labs in 10 days  Your physician recommends that you schedule a follow-up appointment in: 1 month  At the [facility], you and your health needs are our priority. As part of our continuing mission to provide you with exceptional heart care, we have created designated Provider Care Teams. These Care Teams include your primary Cardiologist (physician) and Advanced Practice Providers (APPs- Physician Assistants and Nurse Practitioners) who all work together to provide you with the care you need, when you need it.   You may see any of the following providers on your designated Care Team at your next follow up: Marland Kitchen Dr Glori Bickers . Dr Loralie Champagne . Darrick Grinder, NP   Please be sure to bring in all your medications bottles to every appointment.

## 2018-12-03 NOTE — Progress Notes (Signed)
Advanced Heart Failure Clinic Note   PCP: [name], [name], MD PCP-Cardiologist: [name], MD  HF: [name]  HPI: [name] is a 83 y.o. male with h/o mechanical AVR 1994, OSA on CPAP, Chronic afib, chronic combined CHF, CKD III, chronic coumadin therapy, CAD s/p CAGB 1994, and COPD.   Admitted 7/18-11/05/17 from [facility] office with A/C diastolic HF. Diuresed 83 lbs with lasix drip, metolazone, and diamox. Transitioned to torsemide 80 mg BID. Determined not to be a candidate for MitraClip. Dr [name] also consulted and recommended aggressive medical therapy versus  percutaneous MVR trial as he felt very high risk for conventional surgery. Course complicated by drop in hemoglobin and symptomatic hypotension, requiring 1 unit pRBCs. GI consulted and did not recommend scoping. BB and spiro were stopped due to low BPs. Had some AKI assoicated with low BP, but resolved by discharge. Required lovenox bridge at DC with subtherapeutic INR. Discharged with [name]. DC weight: 216 lbs.   Admitted 9/11 - 12/14/17 for dental extractions in setting of work up for MVR. Hospital course complicated by run of VT in the setting of hypokalemia. Started on amiodarone with h/o of arrhythmia. Bridged with lovenox.   Admitted 9/22 - 12/28/17 with symptomatic anemia from bleeding gums. Bleeding stopped with holding heparin and coumadin. Received multiple units of blood.   He was admitted again in 10/19 with left thigh hematoma and went from there to inpatient rehab.   He has been evaluated in [place] for a transcatheter mitral valve.  RHC/LHC was done in 1/20 as part of his pre-op evaluation.  Filling pressures and cardiac output were well-compensated, he had patent SVG-PDA.  Echo in 1/20 showed EF 40-45%, moderate RV dilation with normal systolic function, severe MR with likely rheumatic mitral valve, normal St Jude mechanical aortic valve. Unfortunately, it was determined that he would not qualify for any of  the percutaneous mitral valve studies.    He returns for followup of diastolic CHF, mechanical aortic valve, chronic atrial fibrillation, and CAD.  Weight is up about 9 lbs.  He says that he under-dosed his torsemide for about 2-3 weeks (took 20 mg in the morning and 60 mg in the afternoon). He developed a cough/congestion with the weight gain. For the last week or so, he has been taking torsemide 100 qam/60 qpm and weight has trended down.  In the past, he took torsemide 100 mg bid and probably had best control of CHF at this dose.  He can walk short distances on flat ground with a cane without dyspnea.  He is short of breath walking more than 100-200 feet.  No orthopnea/PND.  No chest pain.   Labs (10/19): hgb 9.2 Labs (11/19): K 3.2, creatinine 1.31 Labs (1/20): K 4.5, creatinine 1.38 Labs (2/20): LDL 64 Labs (6/20): K 4.1, creatinine 1.8, hgb 12.9  ROS:  All systems reviewed and negative except as per HPI.    PMH: 1. Carotid stenosis: S/p left CEA in 1994.  2. Atrial fibrillation: Chronic.  He is on warfarin with mechanical aortic valve.  3. Type II diabetes.  4. COPD: PFTs (11/19) with moderate obstructive airways disease and severely decreased DLCO.  5. OSA: Uses CPAP.  6. 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  7. ITP 8. VT: On amiodarone.  9. CKD: Stage 3.  10. 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  11. Chronic systolic CHF: TEE (  7/19) with EF 55-60%, D-shaped septum, mildly dilated RV with mildly decreased systolic function, mechanical aortic valve, rheumatic-appearing mitral valve with severe MR, minimal stenosis.  - RHC (1/20): mean RA 6, PA 54/20, mean PCWP 15, CI 3.42, PVR 2.1 WU, PAPI 5.2 - Echo (1/20): EF 40-45%, moderate RV dilation with normal systolic function, severe biatrial enlargement, severe rheumatic MR, St Jude mechanical aortic valve functioning normally.   12. ABIs (12/18): Normal.  13. CAD: S/p CABG 1994.  - LHC (1/20): 50% D1, 60% ostial LCx, totally occluded RCA with patent SVG-PDA.     Current Outpatient Medications  Medication Sig Dispense Refill  . acetaminophen (TYLENOL) 500 MG tablet Take 1,000 mg by mouth every 6 (six) hours as needed for moderate pain or headache.    . allopurinol (ZYLOPRIM) 100 MG tablet Take 1 tablet daily 30 tablet 6  . amiodarone (PACERONE) 200 MG tablet Take 1 tablet (200 mg total) by mouth daily. 90 tablet 3  . carvedilol (COREG) 3.125 MG tablet Take 1 tablet (3.125 mg total) by mouth 2 (two) times daily. 60 tablet 6  . ezetimibe-simvastatin (VYTORIN) 10-20 MG tablet TAKE 1 TABLET BY MOUTH EVERYDAY AT BEDTIME 90 tablet 0  . finasteride (PROSCAR) 5 MG tablet Take 5 mg by mouth daily.    Marland Kitchen levothyroxine (SYNTHROID, LEVOTHROID) 50 MCG tablet Take 1 tablet (50 mcg total) by mouth daily before breakfast. (Patient taking differently: Take 100 mcg by mouth daily before breakfast. ) 90 tablet 3  . metFORMIN (GLUCOPHAGE) 500 MG tablet Take 750 mg by mouth daily with breakfast.     . metolazone (ZAROXOLYN) 2.5 MG tablet Take 2.5 mg by mouth 2 (two) times a week. Every Monday and thursday    . polyethylene glycol powder (GLYCOLAX/MIRALAX) powder Take 17 g by mouth daily as needed for moderate constipation.    . potassium chloride SA (KLOR-CON M20) 20 MEQ tablet TAKE 3 TABS EVERY MORNING, 2 TABS AT NOON AND 2 TABS EVERY EVENING 630 tablet 1  . spironolactone (ALDACTONE) 25 MG tablet Take 25 mg by mouth daily.    . tamsulosin (FLOMAX) 0.4 MG CAPS capsule Take 0.4 mg by mouth.    . torsemide (DEMADEX) 20 MG tablet Take 5 tablets (100 mg total) by mouth 2 (two) times daily. 300 tablet 11  . warfarin (COUMADIN) 5 MG tablet TAKE 1 & 1/2 TABS DAILY EXCEPT 1 TAB ON TUES AND THURS OR AS DIRECTED BY COUMADIN CLINIC. 140 tablet 0   No current facility-administered medications for this encounter.    Allergies  Allergen Reactions   . Rifampin Rash and Other (See Comments)    May have been caused by Vancomycin or Rifampin (??)  . Vancomycin Rash and Other (See Comments)    May have been caused by Vancomycin or Rifampin (??)   Social History   Socioeconomic History  . Marital status: Married    Spouse name: Not on file  . Number of children: 3  . Years of education: Not on file  . Highest education level: Not on file  Occupational History  . Not on file  Social Needs  . Financial resource strain: Not on file  . Food insecurity    Worry: Not on file    Inability: Not on file  . Transportation needs    Medical: Not on file    Non-medical: Not on file  Tobacco Use  . Smoking status: Former Smoker    Packs/day: 1.00    Years: 30.00  Pack years: 30.00    Types: Cigarettes    Quit date: 04/03/1992    Years since quitting: 26.6  . Smokeless tobacco: Never Used  Substance and Sexual Activity  . Alcohol use: Yes    Comment: 4-6 ounces of wine daily  . Drug use: No    Comment: Half a cup a day.  Marland Kitchen Sexual activity: Not on file  Lifestyle  . Physical activity    Days per week: Not on file    Minutes per session: Not on file  . Stress: Not on file  Relationships  . Social Herbalist on phone: Not on file    Gets together: Not on file    Attends religious service: Not on file    Active member of club or organization: Not on file    Attends meetings of clubs or organizations: Not on file    Relationship status: Not on file  . Intimate partner violence    Fear of current or ex partner: No    Emotionally abused: No    Physically abused: No    Forced sexual activity: No  Other Topics Concern  . Not on file  Social History Narrative   Patient is married and lives with his wife.   Youngest daughter lives in [place] also.   Patient has 2 other children.    Family History  Problem Relation Age of Onset  . Cancer Father        lung   . Hypertension Mother    Vitals:   12/03/18 0921   BP: 132/86  Pulse: 71  SpO2: 94%  Weight: 101 kg (222 lb 9.6 oz)     Wt Readings from Last 3 Encounters:  12/03/18 101 kg (222 lb 9.6 oz)  09/24/18 97.1 kg (214 lb)  08/19/18 96.6 kg (213 lb)   PHYSICAL EXAM: General: NAD Neck: JVP 8-9 cm with HJR, no thyromegaly or thyroid nodule.  Lungs: Clear to auscultation bilaterally with normal respiratory effort. CV: Nondisplaced PMI.  Heart irregular S1/S2, no S3/S4, 2/6 HSM apex.  1+ edema to knees.  No carotid bruit.  Normal pedal pulses.  Abdomen: Soft, nontender, no hepatosplenomegaly, no distention.  Skin: Intact without lesions or rashes.  Neurologic: Alert and oriented x 3.  Psych: Normal affect. Extremities: No clubbing or cyanosis.  HEENT: Normal.   ASSESSMENT & PLAN: 1. Chronic systolic CHF with prominent RV failure:  Echo in 1/20 with EF 40-45%, moderate RV dilation/normal function, severe rheumatic mitral regurgitation, normal mechanical aortic valve.  Severe MR likely plays a significant role in CHF and RV failure. NYHA class III.  Weight is up in the setting of accidental under-dosing of torsemide recently.  - Increase torsemide back to 100 mg bid (he seemed to have the best volume control at this dose) and continue metolazone to 2.5 mg twice weekly. BMET today and in 10 days.  - Continue spironolactone 25 mg daily.    - Continue Coreg 3.125 mg bid.  2. Mechanical aortic valve: Appeared to function well on last echo.  - Continue warfarin with INR goal 2.5-3.5 (also with atrial fibrillation).  - He is off aspirin with spontaneous thigh hematoma.   3. Atrial fibrillation: Chronic, rate is controlled.  4. CAD: s/p CABG.  No chest pain.  Cath in 1/20 with patent SVG-PDA, no interventional target.  - No ASA given stable CAD with warfarin use.   - Continue Vytorin, good lipids in 2/20.  5. COPD: He is  no longer using oxygen during the day.  Moderate COPD on 11/19 PFTs.   6. OSA: Continue nightly BiPAP.  7.CKD Stage 3: BMET  today.  8. Thrombocytopenia: Chronic, has been mild/stable.   9. Severe MR: TEE 7/19 with severe MR with possible rheumatic MV (do not think MS is significant, mildly elevated mean gradient from high flow with severe MR).  Structural Heart team evaluated, not a Mitraclip candidate. Seen by Dr. [name], would be high risk for open surgery.  He was evaluated at [facility] in [place] for percutaneous mitral valve trials.  Unfortunately, he does not qualify for any of the available trials.  We will have to continue medical management.  10. VT: He is on amiodarone to suppress VT.  - Check LFTs.  Hypothyroidism followed buy PCP.  Will need regular eye exam on amiodaeone.   Followup with APP in 1 month.   Loralie Champagne 12/03/2018

## 2018-12-13 ENCOUNTER — Other Ambulatory Visit: Payer: Self-pay

## 2018-12-13 ENCOUNTER — Ambulatory Visit (HOSPITAL_COMMUNITY)
Admission: RE | Admit: 2018-12-13 | Discharge: 2018-12-13 | Disposition: A | Discharge status: Home / Self Care | Payer: Medicare Other | Source: Ambulatory Visit | Attending: Cardiology | Admitting: Cardiology

## 2018-12-13 DIAGNOSIS — I5032 Chronic diastolic (congestive) heart failure: Secondary | ICD-10-CM

## 2018-12-13 LAB — BASIC METABOLIC PANEL
Anion gap: 14 (ref 5–15)
BUN: 72 mg/dL — ABNORMAL HIGH (ref 8–23)
CO2: 28 mmol/L (ref 22–32)
Calcium: 9.6 mg/dL (ref 8.9–10.3)
Chloride: 92 mmol/L — ABNORMAL LOW (ref 98–111)
Creatinine, Ser: 2.12 mg/dL — ABNORMAL HIGH (ref 0.61–1.24)
GFR calc Af Amer: 33 mL/min — ABNORMAL LOW (ref >60)
GFR calc non Af Amer: 28 mL/min — ABNORMAL LOW (ref >60)
Glucose, Bld: 122 mg/dL — ABNORMAL HIGH (ref 70–99)
Potassium: 4 mmol/L (ref 3.5–5.1)
Sodium: 134 mmol/L — ABNORMAL LOW (ref 135–145)

## 2018-12-20 DIAGNOSIS — R3914 Feeling of incomplete bladder emptying: Secondary | ICD-10-CM | POA: Diagnosis not present

## 2018-12-20 DIAGNOSIS — N401 Enlarged prostate with lower urinary tract symptoms: Secondary | ICD-10-CM | POA: Diagnosis not present

## 2018-12-20 DIAGNOSIS — R3912 Poor urinary stream: Secondary | ICD-10-CM | POA: Diagnosis not present

## 2018-12-20 NOTE — Telephone Encounter (Addendum)
2 week d/l after receive unit. D/L shows no DAYS OF DATA.  Patient never started using his new cpap.  Patient complains of nose bleeds and he can not wear his cpap.  Marland Kitchen

## 2018-12-24 ENCOUNTER — Other Ambulatory Visit (HOSPITAL_COMMUNITY): Payer: Self-pay

## 2018-12-24 ENCOUNTER — Ambulatory Visit (HOSPITAL_COMMUNITY)
Admission: RE | Admit: 2018-12-24 | Discharge: 2018-12-24 | Disposition: A | Discharge status: Home / Self Care | Payer: Medicare Other | Source: Ambulatory Visit | Attending: Cardiology | Admitting: Cardiology

## 2018-12-24 ENCOUNTER — Other Ambulatory Visit: Payer: Self-pay

## 2018-12-24 DIAGNOSIS — I5032 Chronic diastolic (congestive) heart failure: Secondary | ICD-10-CM | POA: Diagnosis not present

## 2018-12-24 LAB — BASIC METABOLIC PANEL
Anion gap: 15 (ref 5–15)
BUN: 82 mg/dL — ABNORMAL HIGH (ref 8–23)
CO2: 28 mmol/L (ref 22–32)
Calcium: 9.4 mg/dL (ref 8.9–10.3)
Chloride: 92 mmol/L — ABNORMAL LOW (ref 98–111)
Creatinine, Ser: 2.17 mg/dL — ABNORMAL HIGH (ref 0.61–1.24)
GFR calc Af Amer: 32 mL/min — ABNORMAL LOW (ref >60)
GFR calc non Af Amer: 28 mL/min — ABNORMAL LOW (ref >60)
Glucose, Bld: 133 mg/dL — ABNORMAL HIGH (ref 70–99)
Potassium: 3.6 mmol/L (ref 3.5–5.1)
Sodium: 135 mmol/L (ref 135–145)

## 2018-12-25 ENCOUNTER — Ambulatory Visit: Payer: Medicare Other | Admitting: Internal Medicine

## 2018-12-25 ENCOUNTER — Encounter: Payer: Self-pay | Admitting: Internal Medicine

## 2018-12-25 ENCOUNTER — Ambulatory Visit (INDEPENDENT_AMBULATORY_CARE_PROVIDER_SITE_OTHER): Payer: Medicare Other | Admitting: Internal Medicine

## 2018-12-25 VITALS — BP 118/66 | HR 62 | Temp 98.0°F | Ht 74.0 in | Wt 222.0 lb

## 2018-12-25 DIAGNOSIS — Z7901 Long term (current) use of anticoagulants: Secondary | ICD-10-CM | POA: Diagnosis not present

## 2018-12-25 DIAGNOSIS — D62 Acute posthemorrhagic anemia: Secondary | ICD-10-CM | POA: Diagnosis not present

## 2018-12-25 DIAGNOSIS — Z23 Encounter for immunization: Secondary | ICD-10-CM

## 2018-12-25 DIAGNOSIS — I252 Old myocardial infarction: Secondary | ICD-10-CM

## 2018-12-25 DIAGNOSIS — E032 Hypothyroidism due to medicaments and other exogenous substances: Secondary | ICD-10-CM | POA: Diagnosis not present

## 2018-12-25 DIAGNOSIS — I2510 Atherosclerotic heart disease of native coronary artery without angina pectoris: Secondary | ICD-10-CM | POA: Diagnosis not present

## 2018-12-25 DIAGNOSIS — I5032 Chronic diastolic (congestive) heart failure: Secondary | ICD-10-CM

## 2018-12-25 DIAGNOSIS — L570 Actinic keratosis: Secondary | ICD-10-CM

## 2018-12-25 DIAGNOSIS — I480 Paroxysmal atrial fibrillation: Secondary | ICD-10-CM | POA: Diagnosis not present

## 2018-12-25 DIAGNOSIS — Z5181 Encounter for therapeutic drug level monitoring: Secondary | ICD-10-CM

## 2018-12-25 DIAGNOSIS — I10 Essential (primary) hypertension: Secondary | ICD-10-CM | POA: Diagnosis not present

## 2018-12-25 DIAGNOSIS — R5381 Other malaise: Secondary | ICD-10-CM

## 2018-12-25 MED ORDER — TRIAMCINOLONE ACETONIDE 0.1 % EX OINT: 1.0000 "application " | TOPICAL_OINTMENT | Freq: Two times a day (BID) | CUTANEOUS | 1 refills | Status: AC

## 2018-12-25 NOTE — Assessment & Plan Note (Signed)
See cryo 

## 2018-12-25 NOTE — Assessment & Plan Note (Signed)
levothroid  

## 2018-12-25 NOTE — Assessment & Plan Note (Signed)
Much better 

## 2018-12-25 NOTE — Assessment & Plan Note (Signed)
CBC

## 2018-12-25 NOTE — Patient Instructions (Signed)
   Postprocedure instructions :     Keep the wounds clean. You can wash them with liquid soap and water. Pat dry with gauze or a Kleenex tissue  Before applying antibiotic ointment and a Band-Aid.   You need to report immediately  if  any signs of infection develop.    

## 2018-12-25 NOTE — Addendum Note (Signed)
Addended by: Karren Cobble on: 12/25/2018 01:59 PM   Modules accepted: Orders

## 2018-12-25 NOTE — Assessment & Plan Note (Signed)
Torsemide, Spironolactone, Coreg

## 2018-12-25 NOTE — Assessment & Plan Note (Signed)
Coumadin 

## 2018-12-25 NOTE — Assessment & Plan Note (Addendum)
Pacerone Coumadin HR is regular today

## 2018-12-25 NOTE — Progress Notes (Signed)
Subjective:  Patient ID: [name], male    DOB: [birth year]  Age: 83 y.o. MRN: [id number]  CC: No chief complaint on file.   HPI [name] presents for CHF, CRF, BPH f/u C/o skin lesions on scalp  Outpatient Medications Prior to Visit  Medication Sig Dispense Refill  . acetaminophen (TYLENOL) 500 MG tablet Take 1,000 mg by mouth every 6 (six) hours as needed for moderate pain or headache.    . allopurinol (ZYLOPRIM) 100 MG tablet Take 1 tablet daily 30 tablet 6  . amiodarone (PACERONE) 200 MG tablet Take 1 tablet (200 mg total) by mouth daily. 90 tablet 3  . ezetimibe-simvastatin (VYTORIN) 10-20 MG tablet TAKE 1 TABLET BY MOUTH EVERYDAY AT BEDTIME 90 tablet 0  . finasteride (PROSCAR) 5 MG tablet Take 5 mg by mouth daily.    Marland Kitchen levothyroxine (SYNTHROID, LEVOTHROID) 50 MCG tablet Take 1 tablet (50 mcg total) by mouth daily before breakfast. (Patient taking differently: Take 100 mcg by mouth daily before breakfast. ) 90 tablet 3  . metFORMIN (GLUCOPHAGE) 500 MG tablet Take 750 mg by mouth daily with breakfast.     . metolazone (ZAROXOLYN) 2.5 MG tablet Take 2.5 mg by mouth 2 (two) times a week. Every Monday and thursday    . polyethylene glycol powder (GLYCOLAX/MIRALAX) powder Take 17 g by mouth daily as needed for moderate constipation.    . potassium chloride SA (KLOR-CON M20) 20 MEQ tablet TAKE 3 TABS EVERY MORNING, 2 TABS AT NOON AND 2 TABS EVERY EVENING 630 tablet 1  . spironolactone (ALDACTONE) 25 MG tablet Take 25 mg by mouth daily.    . tamsulosin (FLOMAX) 0.4 MG CAPS capsule Take 0.4 mg by mouth.    . torsemide (DEMADEX) 20 MG tablet Take 5 tablets (100 mg total) by mouth 2 (two) times daily. 300 tablet 11  . warfarin (COUMADIN) 5 MG tablet TAKE 1 & 1/2 TABS DAILY EXCEPT 1 TAB ON TUES AND THURS OR AS DIRECTED BY COUMADIN CLINIC. 140 tablet 0  . carvedilol (COREG) 3.125 MG tablet Take 1 tablet (3.125 mg total) by mouth 2 (two) times daily. 60 tablet 6   No  facility-administered medications prior to visit.     ROS: Review of Systems  Constitutional: Positive for fatigue. Negative for appetite change and unexpected weight change.  HENT: Negative for congestion, nosebleeds, sneezing, sore throat and trouble swallowing.   Eyes: Negative for itching and visual disturbance.  Respiratory: Negative for cough.   Cardiovascular: Negative for chest pain, palpitations and leg swelling.  Gastrointestinal: Negative for abdominal distention, blood in stool, diarrhea and nausea.  Genitourinary: Negative for frequency and hematuria.  Musculoskeletal: Positive for arthralgias and gait problem. Negative for back pain, joint swelling and neck pain.  Skin: Negative for rash.  Neurological: Negative for dizziness, tremors, speech difficulty and weakness.  Psychiatric/Behavioral: Negative for agitation, dysphoric mood, sleep disturbance and suicidal ideas. The patient is not nervous/anxious.     Objective:  BP 118/66 (BP Location: Right Arm, Patient Position: Sitting, Cuff Size: Normal)   Pulse 62   Temp 98 F (36.7 C) (Oral)   Ht 6\' 2"  (1.88 m)   Wt 222 lb (100.7 kg)   SpO2 92%   BMI 28.50 kg/m   BP Readings from Last 3 Encounters:  12/25/18 118/66  12/03/18 132/86  09/24/18 120/66    Wt Readings from Last 3 Encounters:  12/25/18 222 lb (100.7 kg)  12/03/18 222 lb 9.6 oz (101 kg)  09/24/18 214  lb (97.1 kg)    Physical Exam Constitutional:      General: He is not in acute distress.    Appearance: He is well-developed.     Comments: NAD  Eyes:     Conjunctiva/sclera: Conjunctivae normal.     Pupils: Pupils are equal, round, and reactive to light.  Neck:     Musculoskeletal: Normal range of motion.     Thyroid: No thyromegaly.     Vascular: No JVD.  Cardiovascular:     Rate and Rhythm: Normal rate and regular rhythm.     Heart sounds: Murmur present. No friction rub. No gallop.   Pulmonary:     Effort: Pulmonary effort is normal. No  respiratory distress.     Breath sounds: Normal breath sounds. No wheezing or rales.  Chest:     Chest wall: No tenderness.  Abdominal:     General: Bowel sounds are normal. There is no distension.     Palpations: Abdomen is soft. There is no mass.     Tenderness: There is abdominal tenderness. There is no guarding or rebound.  Musculoskeletal: Normal range of motion.        General: No tenderness.     Right lower leg: Edema present.     Left lower leg: Edema present.  Lymphadenopathy:     Cervical: No cervical adenopathy.  Skin:    General: Skin is warm and dry.     Findings: No rash.  Neurological:     Mental Status: He is alert and oriented to person, place, and time.     Cranial Nerves: No cranial nerve deficit.     Motor: Weakness present. No abnormal muscle tone.     Coordination: Coordination abnormal.     Gait: Gait abnormal.     Deep Tendon Reflexes: Reflexes are normal and symmetric.  Psychiatric:        Behavior: Behavior normal.        Thought Content: Thought content normal.        Judgment: Judgment normal.   walker Metallic click Walker AKs   Procedure Note :     Procedure : Cryosurgery   Indication:   Actinic keratosis(es)   Risks including unsuccessful procedure , bleeding, infection, bruising, scar, a need for a repeat  procedure and others were explained to the patient in detail as well as the benefits. Informed consent was obtained verbally.   6  lesion(s)  on scalp   was/were treated with liquid nitrogen on a Q-tip in a usual fasion . Band-Aid was applied and antibiotic ointment was given for a later use.   Tolerated well. Complications none.   Postprocedure instructions :     Keep the wounds clean. You can wash them with liquid soap and water. Pat dry with gauze or a Kleenex tissue  Before applying antibiotic ointment and a Band-Aid.   You need to report immediately  if  any signs of infection develop.     Lab Results  Component Value Date    WBC 4.7 09/24/2018   HGB 12.9 (L) 09/24/2018   HCT 37.9 (L) 09/24/2018   PLT 131.0 (L) 09/24/2018   GLUCOSE 133 (H) 12/24/2018   CHOL 129 05/27/2018   TRIG 69 05/27/2018   HDL 51 05/27/2018   LDLCALC 64 05/27/2018   ALT 14 12/03/2018   AST 27 12/03/2018   NA 135 12/24/2018   K 3.6 12/24/2018   CL 92 (L) 12/24/2018   CREATININE 2.17 (H) 12/24/2018  BUN 82 (H) 12/24/2018   CO2 28 12/24/2018   TSH 8.24 (H) 09/24/2018   PSA 2.72 09/24/2018   INR 3.2 (H) 12/03/2018   HGBA1C 5.7 07/01/2018   MICROALBUR 6.4 (H) 10/15/2017    No results found.  Assessment & Plan:   There are no diagnoses linked to this encounter.   No orders of the defined types were placed in this encounter.    Follow-up: No follow-ups on file.  Walker Kehr, MD

## 2019-01-02 ENCOUNTER — Encounter (HOSPITAL_COMMUNITY): Payer: Self-pay

## 2019-01-02 ENCOUNTER — Other Ambulatory Visit: Payer: Self-pay

## 2019-01-02 ENCOUNTER — Ambulatory Visit (HOSPITAL_COMMUNITY)
Admission: RE | Admit: 2019-01-02 | Discharge: 2019-01-02 | Disposition: A | Discharge status: Home / Self Care | Payer: Medicare Other | Source: Ambulatory Visit | Attending: Internal Medicine | Admitting: Internal Medicine

## 2019-01-02 ENCOUNTER — Other Ambulatory Visit (HOSPITAL_COMMUNITY): Payer: Self-pay | Admitting: Cardiology

## 2019-01-02 VITALS — BP 132/70 | HR 63 | Wt 219.8 lb

## 2019-01-02 DIAGNOSIS — Z8249 Family history of ischemic heart disease and other diseases of the circulatory system: Secondary | ICD-10-CM | POA: Insufficient documentation

## 2019-01-02 DIAGNOSIS — G4733 Obstructive sleep apnea (adult) (pediatric): Secondary | ICD-10-CM

## 2019-01-02 DIAGNOSIS — I5032 Chronic diastolic (congestive) heart failure: Secondary | ICD-10-CM | POA: Diagnosis not present

## 2019-01-02 DIAGNOSIS — Z881 Allergy status to other antibiotic agents status: Secondary | ICD-10-CM | POA: Insufficient documentation

## 2019-01-02 DIAGNOSIS — I2510 Atherosclerotic heart disease of native coronary artery without angina pectoris: Secondary | ICD-10-CM | POA: Diagnosis not present

## 2019-01-02 DIAGNOSIS — Z7901 Long term (current) use of anticoagulants: Secondary | ICD-10-CM | POA: Insufficient documentation

## 2019-01-02 DIAGNOSIS — J449 Chronic obstructive pulmonary disease, unspecified: Secondary | ICD-10-CM | POA: Diagnosis not present

## 2019-01-02 DIAGNOSIS — Z79890 Hormone replacement therapy (postmenopausal): Secondary | ICD-10-CM | POA: Diagnosis not present

## 2019-01-02 DIAGNOSIS — I480 Paroxysmal atrial fibrillation: Secondary | ICD-10-CM

## 2019-01-02 DIAGNOSIS — I482 Chronic atrial fibrillation, unspecified: Secondary | ICD-10-CM | POA: Diagnosis not present

## 2019-01-02 DIAGNOSIS — Z79899 Other long term (current) drug therapy: Secondary | ICD-10-CM | POA: Insufficient documentation

## 2019-01-02 DIAGNOSIS — Z951 Presence of aortocoronary bypass graft: Secondary | ICD-10-CM | POA: Insufficient documentation

## 2019-01-02 DIAGNOSIS — Z87891 Personal history of nicotine dependence: Secondary | ICD-10-CM | POA: Insufficient documentation

## 2019-01-02 DIAGNOSIS — D696 Thrombocytopenia, unspecified: Secondary | ICD-10-CM | POA: Insufficient documentation

## 2019-01-02 DIAGNOSIS — E039 Hypothyroidism, unspecified: Secondary | ICD-10-CM | POA: Diagnosis not present

## 2019-01-02 DIAGNOSIS — E1122 Type 2 diabetes mellitus with diabetic chronic kidney disease: Secondary | ICD-10-CM | POA: Diagnosis not present

## 2019-01-02 DIAGNOSIS — I052 Rheumatic mitral stenosis with insufficiency: Secondary | ICD-10-CM | POA: Diagnosis not present

## 2019-01-02 DIAGNOSIS — I5042 Chronic combined systolic (congestive) and diastolic (congestive) heart failure: Secondary | ICD-10-CM | POA: Diagnosis not present

## 2019-01-02 DIAGNOSIS — I6523 Occlusion and stenosis of bilateral carotid arteries: Secondary | ICD-10-CM | POA: Insufficient documentation

## 2019-01-02 DIAGNOSIS — Z7984 Long term (current) use of oral hypoglycemic drugs: Secondary | ICD-10-CM | POA: Insufficient documentation

## 2019-01-02 DIAGNOSIS — N183 Chronic kidney disease, stage 3 unspecified: Secondary | ICD-10-CM | POA: Diagnosis not present

## 2019-01-02 DIAGNOSIS — Z952 Presence of prosthetic heart valve: Secondary | ICD-10-CM | POA: Diagnosis not present

## 2019-01-02 MED ORDER — AMIODARONE HCL 200 MG PO TABS: 200.0000 mg | ORAL_TABLET | Freq: Every day | ORAL | 3 refills | Status: DC

## 2019-01-02 NOTE — Patient Instructions (Signed)
REFILLED Amiodarone   Please follow up with the [facility] in 3 months.  At the [facility], you and your health needs are our priority. As part of our continuing mission to provide you with exceptional heart care, we have created designated Provider Care Teams. These Care Teams include your primary Cardiologist (physician) and Advanced Practice Providers (APPs- Physician Assistants and Nurse Practitioners) who all work together to provide you with the care you need, when you need it.   You may see any of the following providers on your designated Care Team at your next follow up: Marland Kitchen Dr Glori Bickers . Dr Loralie Champagne . Darrick Grinder, NP   Please be sure to bring in all your medications bottles to every appointment.

## 2019-01-02 NOTE — Progress Notes (Signed)
Advanced Heart Failure Clinic Note   PCP: [name], [name], MD PCP-Cardiologist: [name], MD  HF: [name]  HPI: [name] is a 83 y.o. male with h/o mechanical AVR 1994, OSA on CPAP, Chronic afib, chronic combined CHF, CKD III, chronic coumadin therapy, CAD s/p CAGB 1994, and COPD.   Admitted 7/18-11/05/17 from [facility] office with A/C diastolic HF. Diuresed 83 lbs with lasix drip, metolazone, and diamox. Transitioned to torsemide 80 mg BID. Determined not to be a candidate for MitraClip. Dr [name] also consulted and recommended aggressive medical therapy versus  percutaneous MVR trial as he felt very high risk for conventional surgery. Course complicated by drop in hemoglobin and symptomatic hypotension, requiring 1 unit pRBCs. GI consulted and did not recommend scoping. BB and spiro were stopped due to low BPs. Had some AKI assoicated with low BP, but resolved by discharge. Required lovenox bridge at DC with subtherapeutic INR. Discharged with [facility]. DC weight: 216 lbs.   Admitted 9/11 - 12/14/17 for dental extractions in setting of work up for MVR. Hospital course complicated by run of VT in the setting of hypokalemia. Started on amiodarone with h/o of arrhythmia. Bridged with lovenox.   Admitted 9/22 - 12/28/17 with symptomatic anemia from bleeding gums. Bleeding stopped with holding heparin and coumadin. Received multiple units of blood.   He was admitted again in 10/19 with left thigh hematoma and went from there to inpatient rehab.   He has been evaluated in [place] for a transcatheter mitral valve.  RHC/LHC was done in 1/20 as part of his pre-op evaluation.  Filling pressures and cardiac output were well-compensated, he had patent SVG-PDA.  Echo in 1/20 showed EF 40-45%, moderate RV dilation with normal systolic function, severe MR with likely rheumatic mitral valve, normal St Jude mechanical aortic valve. Unfortunately, it was determined that he would not qualify for any of  the percutaneous mitral valve studies.    Today he returns for HF follow up. Last visit torsemide was increased to 100 mg twice a day. Overall feeling fine.  Mild SOB with exertion. Denies PND/Orthopnea. Using Bipap every night. No bleeding issues.  Uses rolling walker. Appetite ok. No fever or chills. Weight at home 213-216  pounds. Taking all medications.  Labs (10/19): hgb 9.2 Labs (11/19): K 3.2, creatinine 1.31 Labs (1/20): K 4.5, creatinine 1.38 Labs (2/20): LDL 64 Labs (6/20): K 4.1, creatinine 1.8, hgb 12.9 Labs (12/24/18): K 3.6 Creatinine 2.2  ROS:  All systems reviewed and negative except as per HPI.    PMH: 1. Carotid stenosis: S/p left CEA in 1994.  2. Atrial fibrillation: Chronic.  He is on warfarin with mechanical aortic valve.  3. Type II diabetes.  4. COPD: PFTs (11/19) with moderate obstructive airways disease and severely decreased DLCO.  5. OSA: Uses CPAP.  6. 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  7. ITP 8. VT: On amiodarone.  9. CKD: Stage 3.  10. 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  11. Chronic systolic CHF: TEE (1/61) with EF 55-60%, D-shaped septum, mildly dilated RV with mildly decreased systolic function, mechanical aortic valve, rheumatic-appearing mitral valve with severe MR, minimal stenosis.  - RHC (1/20): mean RA 6, PA 54/20, mean PCWP 15, CI 3.42, PVR 2.1 WU, PAPI 5.2 - Echo (1/20): EF 40-45%, moderate RV dilation with normal systolic function, severe biatrial enlargement, severe rheumatic MR, St Jude mechanical aortic  valve functioning normally.  12. ABIs (12/18): Normal.  13. CAD: S/p CABG 1994.  - LHC (1/20): 50% D1, 60% ostial LCx, totally occluded RCA with patent SVG-PDA.     Current Outpatient Medications  Medication Sig Dispense Refill  . acetaminophen (TYLENOL) 500 MG tablet Take 1,000 mg by mouth every 6 (six) hours as needed  for moderate pain or headache.    . allopurinol (ZYLOPRIM) 100 MG tablet Take 1 tablet daily 30 tablet 6  . amiodarone (PACERONE) 200 MG tablet Take 1 tablet (200 mg total) by mouth daily. 90 tablet 3  . ezetimibe-simvastatin (VYTORIN) 10-20 MG tablet TAKE 1 TABLET BY MOUTH EVERYDAY AT BEDTIME 90 tablet 0  . finasteride (PROSCAR) 5 MG tablet Take 5 mg by mouth daily.    Marland Kitchen levothyroxine (SYNTHROID, LEVOTHROID) 50 MCG tablet Take 1 tablet (50 mcg total) by mouth daily before breakfast. (Patient taking differently: Take 100 mcg by mouth daily before breakfast. ) 90 tablet 3  . metFORMIN (GLUCOPHAGE) 500 MG tablet Take 750 mg by mouth daily with breakfast.     . metolazone (ZAROXOLYN) 2.5 MG tablet Take 2.5 mg by mouth 2 (two) times a week. Every Monday and thursday    . polyethylene glycol powder (GLYCOLAX/MIRALAX) powder Take 17 g by mouth daily as needed for moderate constipation.    . potassium chloride SA (KLOR-CON M20) 20 MEQ tablet TAKE 3 TABS EVERY MORNING, 2 TABS AT NOON AND 2 TABS EVERY EVENING 630 tablet 1  . spironolactone (ALDACTONE) 25 MG tablet Take 25 mg by mouth daily.    . tamsulosin (FLOMAX) 0.4 MG CAPS capsule Take 0.4 mg by mouth.    . torsemide (DEMADEX) 20 MG tablet Take 5 tablets (100 mg total) by mouth 2 (two) times daily. 300 tablet 11  . triamcinolone ointment (KENALOG) 0.1 % Apply 1 application topically 2 (two) times daily. 80 g 1  . warfarin (COUMADIN) 5 MG tablet TAKE 1 & 1/2 TABS DAILY EXCEPT 1 TAB ON TUES AND THURS OR AS DIRECTED BY COUMADIN CLINIC. 140 tablet 0  . carvedilol (COREG) 3.125 MG tablet Take 1 tablet (3.125 mg total) by mouth 2 (two) times daily. 60 tablet 6   No current facility-administered medications for this encounter.    Allergies  Allergen Reactions  . Rifampin Rash and Other (See Comments)    May have been caused by Vancomycin or Rifampin (??)  . Vancomycin Rash and Other (See Comments)    May have been caused by Vancomycin or Rifampin (??)    Social History   Socioeconomic History  . Marital status: Married    Spouse name: Not on file  . Number of children: 3  . Years of education: Not on file  . Highest education level: Not on file  Occupational History  . Not on file  Social Needs  . Financial resource strain: Not on file  . Food insecurity    Worry: Not on file    Inability: Not on file  . Transportation needs    Medical: Not on file    Non-medical: Not on file  Tobacco Use  . Smoking status: Former Smoker    Packs/day: 1.00    Years: 30.00    Pack years: 30.00    Types: Cigarettes    Quit date: 04/03/1992    Years since quitting: 26.7  . Smokeless tobacco: Never Used  Substance and Sexual Activity  . Alcohol use: Yes    Comment: 4-6 ounces of wine daily  .  Drug use: No    Comment: Half a cup a day.  Marland Kitchen Sexual activity: Not on file  Lifestyle  . Physical activity    Days per week: Not on file    Minutes per session: Not on file  . Stress: Not on file  Relationships  . Social Herbalist on phone: Not on file    Gets together: Not on file    Attends religious service: Not on file    Active member of club or organization: Not on file    Attends meetings of clubs or organizations: Not on file    Relationship status: Not on file  . Intimate partner violence    Fear of current or ex partner: No    Emotionally abused: No    Physically abused: No    Forced sexual activity: No  Other Topics Concern  . Not on file  Social History Narrative   Patient is married and lives with his wife.   Youngest daughter lives in [place] also.   Patient has 2 other children.    Family History  Problem Relation Age of Onset  . Cancer Father        lung   . Hypertension Mother    Vitals:   01/02/19 1038  BP: 132/70  Pulse: 63  SpO2: 95%  Weight: 99.7 kg (219 lb 12.8 oz)     Wt Readings from Last 3 Encounters:  01/02/19 99.7 kg (219 lb 12.8 oz)  12/25/18 100.7 kg (222 lb)  12/03/18 101 kg (222 lb  9.6 oz)   PHYSICAL EXAM: General:  Well appearing. No resp difficulty. Walked in the clinic with a rolling walker  HEENT: normal Neck: supple. JVP 6-7 . Carotids 2+ bilat; no bruits. No lymphadenopathy or thryomegaly appreciated. Cor: PMI nondisplaced. Irregular rate & rhythm. No rubs, gallops or murmurs. Lungs: clear Abdomen: soft, nontender, nondistended. No hepatosplenomegaly. No bruits or masses. Good bowel sounds. Extremities: no cyanosis, clubbing, rash, R and LLE trace edema.  Neuro: alert & orientedx3, cranial nerves grossly intact. moves all 4 extremities w/o difficulty. Affect pleasant  ASSESSMENT & PLAN: 1. Chronic systolic CHF with prominent RV failure:  Echo in 1/20 with EF 40-45%, moderate RV dilation/normal function, severe rheumatic mitral regurgitation, normal mechanical aortic valve.  Severe MR likely plays a significant role in CHF and RV failure.  - NYHA II. Volume status stable.  - Continue torsemide back to 100 mg bid  and continue metolazone to 2.5 mg twice weekly.   - Continue spironolactone 25 mg daily.    - Continue Coreg 3.125 mg bid.  2. Mechanical aortic valve: Appeared to function well on last echo.  - Continue warfarin with INR goal 2.5-3.5 (also with atrial fibrillation).  - He is off aspirin with spontaneous thigh hematoma.   3. Atrial fibrillation: Rate controlled.   4. CAD: s/p CABG.  No chest pain.  Cath in 1/20 with patent SVG-PDA, no interventional target.  - No ASA given stable CAD with warfarin use.   - Continue Vytorin, good lipids in 2/20.  5. COPD: He is no longer using oxygen during the day.  Moderate COPD on 11/19 PFTs.   6. OSA: Continue nightly BiPAP.  7.CKD Stage 3: Recent BMET stable.  8. Thrombocytopenia: Chronic, has been mild/stable.   9. Severe MR: TEE 7/19 with severe MR with possible rheumatic MV (do not think MS is significant, mildly elevated mean gradient from high flow with severe MR).  Structural Heart  team evaluated, not a  Mitraclip candidate. Seen by Dr. [name], would be high risk for open surgery.  He was evaluated at [facility] in [place] for percutaneous mitral valve trials.  Unfortunately, he does not qualify for any of the available trials.  We will have to continue medical management.  10. VT: He is on amiodarone to suppress VT.  - Hypothyroidism followed buy PCP.  Will need regular eye exam on amiodaeone.    Follow up with [name] in 3 months.   Amy Clegg 01/02/2019

## 2019-01-07 ENCOUNTER — Ambulatory Visit: Payer: Medicare Other | Admitting: Cardiology

## 2019-01-07 ENCOUNTER — Ambulatory Visit (INDEPENDENT_AMBULATORY_CARE_PROVIDER_SITE_OTHER): Payer: Medicare Other | Admitting: *Deleted

## 2019-01-07 ENCOUNTER — Other Ambulatory Visit: Payer: Self-pay

## 2019-01-07 DIAGNOSIS — Z5181 Encounter for therapeutic drug level monitoring: Secondary | ICD-10-CM

## 2019-01-07 DIAGNOSIS — Z952 Presence of prosthetic heart valve: Secondary | ICD-10-CM | POA: Diagnosis not present

## 2019-01-07 DIAGNOSIS — I4891 Unspecified atrial fibrillation: Secondary | ICD-10-CM | POA: Diagnosis not present

## 2019-01-07 LAB — POCT INR: INR: 3.2 — AB (ref 2.0–3.0)

## 2019-01-07 NOTE — Patient Instructions (Signed)
Description   Continue taking 1.5 tablets daily except 1 tablet on Tuesdays, Thursdays, and Saturdays. Recheck INR in 7 weeks. Call coumadin clinic for any questions [phone].

## 2019-01-09 ENCOUNTER — Other Ambulatory Visit: Payer: Self-pay | Admitting: Interventional Cardiology

## 2019-01-27 ENCOUNTER — Other Ambulatory Visit: Payer: Self-pay | Admitting: Internal Medicine

## 2019-02-03 ENCOUNTER — Other Ambulatory Visit (HOSPITAL_COMMUNITY): Payer: Self-pay

## 2019-02-03 DIAGNOSIS — R3914 Feeling of incomplete bladder emptying: Secondary | ICD-10-CM | POA: Diagnosis not present

## 2019-02-03 DIAGNOSIS — R3912 Poor urinary stream: Secondary | ICD-10-CM | POA: Diagnosis not present

## 2019-02-03 DIAGNOSIS — N401 Enlarged prostate with lower urinary tract symptoms: Secondary | ICD-10-CM | POA: Diagnosis not present

## 2019-02-03 DIAGNOSIS — R8271 Bacteriuria: Secondary | ICD-10-CM | POA: Diagnosis not present

## 2019-02-03 MED ORDER — SPIRONOLACTONE 25 MG PO TABS: 25.0000 mg | ORAL_TABLET | Freq: Every day | ORAL | 3 refills | Status: DC

## 2019-02-10 ENCOUNTER — Other Ambulatory Visit (HOSPITAL_COMMUNITY): Payer: Self-pay

## 2019-02-10 MED ORDER — CARVEDILOL 3.125 MG PO TABS: 3.1250 mg | ORAL_TABLET | Freq: Two times a day (BID) | ORAL | 6 refills | Status: DC

## 2019-02-25 ENCOUNTER — Ambulatory Visit (INDEPENDENT_AMBULATORY_CARE_PROVIDER_SITE_OTHER): Payer: Medicare Other | Admitting: *Deleted

## 2019-02-25 ENCOUNTER — Other Ambulatory Visit: Payer: Self-pay

## 2019-02-25 DIAGNOSIS — Z952 Presence of prosthetic heart valve: Secondary | ICD-10-CM

## 2019-02-25 DIAGNOSIS — Z5181 Encounter for therapeutic drug level monitoring: Secondary | ICD-10-CM

## 2019-02-25 DIAGNOSIS — I4891 Unspecified atrial fibrillation: Secondary | ICD-10-CM

## 2019-02-25 LAB — POCT INR: INR: 3.3 — AB (ref 2.0–3.0)

## 2019-02-25 NOTE — Patient Instructions (Signed)
Description   Continue taking 1.5 tablets daily except 1 tablet on Tuesdays, Thursdays, and Saturdays. Recheck INR in 8 weeks. Call coumadin clinic for any questions [phone].

## 2019-03-04 ENCOUNTER — Other Ambulatory Visit (HOSPITAL_COMMUNITY): Payer: Self-pay

## 2019-03-04 MED ORDER — ALLOPURINOL 100 MG PO TABS: ORAL_TABLET | ORAL | 6 refills | Status: DC

## 2019-03-17 ENCOUNTER — Other Ambulatory Visit (HOSPITAL_COMMUNITY): Payer: Self-pay | Admitting: Cardiology

## 2019-03-25 ENCOUNTER — Other Ambulatory Visit: Payer: Self-pay

## 2019-03-25 ENCOUNTER — Encounter: Payer: Self-pay | Admitting: Internal Medicine

## 2019-03-25 ENCOUNTER — Ambulatory Visit (INDEPENDENT_AMBULATORY_CARE_PROVIDER_SITE_OTHER): Payer: Medicare Other | Admitting: Internal Medicine

## 2019-03-25 DIAGNOSIS — I5032 Chronic diastolic (congestive) heart failure: Secondary | ICD-10-CM | POA: Diagnosis not present

## 2019-03-25 DIAGNOSIS — T148XXA Other injury of unspecified body region, initial encounter: Secondary | ICD-10-CM | POA: Insufficient documentation

## 2019-03-25 DIAGNOSIS — R040 Epistaxis: Secondary | ICD-10-CM

## 2019-03-25 DIAGNOSIS — I2510 Atherosclerotic heart disease of native coronary artery without angina pectoris: Secondary | ICD-10-CM | POA: Diagnosis not present

## 2019-03-25 DIAGNOSIS — E118 Type 2 diabetes mellitus with unspecified complications: Secondary | ICD-10-CM

## 2019-03-25 DIAGNOSIS — D61818 Other pancytopenia: Secondary | ICD-10-CM

## 2019-03-25 DIAGNOSIS — I252 Old myocardial infarction: Secondary | ICD-10-CM

## 2019-03-25 LAB — BASIC METABOLIC PANEL
BUN: 80 mg/dL — ABNORMAL HIGH (ref 6–23)
CO2: 26 mEq/L (ref 19–32)
Calcium: 9.5 mg/dL (ref 8.4–10.5)
Chloride: 95 mEq/L — ABNORMAL LOW (ref 96–112)
Creatinine, Ser: 2.27 mg/dL — ABNORMAL HIGH (ref 0.40–1.50)
GFR: 27.78 mL/min — ABNORMAL LOW (ref >60.00)
Glucose, Bld: 123 mg/dL — ABNORMAL HIGH (ref 70–99)
Potassium: 3.7 mEq/L (ref 3.5–5.1)
Sodium: 137 mEq/L (ref 135–145)

## 2019-03-25 LAB — CBC WITH DIFFERENTIAL/PLATELET
Basophils Absolute: 0 10*3/uL (ref 0.0–0.1)
Basophils Relative: 0.9 % (ref 0.0–3.0)
Eosinophils Absolute: 0.1 10*3/uL (ref 0.0–0.7)
Eosinophils Relative: 2.6 % (ref 0.0–5.0)
HCT: 33.1 % — ABNORMAL LOW (ref 39.0–52.0)
Hemoglobin: 11.3 g/dL — ABNORMAL LOW (ref 13.0–17.0)
Lymphocytes Relative: 15.3 % (ref 12.0–46.0)
Lymphs Abs: 0.5 10*3/uL — ABNORMAL LOW (ref 0.7–4.0)
MCHC: 34.3 g/dL (ref 30.0–36.0)
MCV: 112.8 fl — ABNORMAL HIGH (ref 78.0–100.0)
Monocytes Absolute: 0.4 10*3/uL (ref 0.1–1.0)
Monocytes Relative: 10.4 % (ref 3.0–12.0)
Neutro Abs: 2.5 10*3/uL (ref 1.4–7.7)
Neutrophils Relative %: 70.8 % (ref 43.0–77.0)
Platelets: 111 10*3/uL — ABNORMAL LOW (ref 150.0–400.0)
RBC: 2.94 Mil/uL — ABNORMAL LOW (ref 4.22–5.81)
RDW: 17.2 % — ABNORMAL HIGH (ref 11.5–15.5)
WBC: 3.6 10*3/uL — ABNORMAL LOW (ref 4.0–10.5)

## 2019-03-25 LAB — PROTIME-INR
INR: 4.1 ratio — ABNORMAL HIGH (ref 0.8–1.0)
Prothrombin Time: 46.2 s — ABNORMAL HIGH (ref 9.6–13.1)

## 2019-03-25 LAB — HEPATIC FUNCTION PANEL
ALT: 11 U/L (ref 0–53)
AST: 18 U/L (ref 0–37)
Albumin: 4.4 g/dL (ref 3.5–5.2)
Alkaline Phosphatase: 84 U/L (ref 39–117)
Bilirubin, Direct: 0.2 mg/dL (ref 0.0–0.3)
Total Bilirubin: 0.8 mg/dL (ref 0.2–1.2)
Total Protein: 7.1 g/dL (ref 6.0–8.3)

## 2019-03-25 LAB — HEMOGLOBIN A1C: Hgb A1c MFr Bld: 5.6 % (ref 4.6–6.5)

## 2019-03-25 LAB — APTT: aPTT: 51.3 s — ABNORMAL HIGH (ref 23.4–32.7)

## 2019-03-25 NOTE — Assessment & Plan Note (Signed)
Labs

## 2019-03-25 NOTE — Assessment & Plan Note (Signed)
CBC INR

## 2019-03-25 NOTE — Progress Notes (Signed)
Subjective:  Patient ID: [name], male    DOB: [birth year]  Age: 83 y.o. MRN: [id number]  CC: No chief complaint on file.   HPI [name] presents for CHF, DM, dyslipidemia f/u C/o skin lumps on back  Outpatient Medications Prior to Visit  Medication Sig Dispense Refill  . acetaminophen (TYLENOL) 500 MG tablet Take 1,000 mg by mouth every 6 (six) hours as needed for moderate pain or headache.    . allopurinol (ZYLOPRIM) 100 MG tablet Take 1 tablet daily 30 tablet 6  . amiodarone (PACERONE) 200 MG tablet Take 1 tablet (200 mg total) by mouth daily. 90 tablet 3  . carvedilol (COREG) 3.125 MG tablet Take 1 tablet (3.125 mg total) by mouth 2 (two) times daily. 60 tablet 6  . ezetimibe-simvastatin (VYTORIN) 10-20 MG tablet TAKE 1 TABLET BY MOUTH EVERYDAY AT BEDTIME 90 tablet 1  . finasteride (PROSCAR) 5 MG tablet Take 5 mg by mouth daily.    Marland Kitchen levothyroxine (SYNTHROID, LEVOTHROID) 50 MCG tablet Take 1 tablet (50 mcg total) by mouth daily before breakfast. (Patient taking differently: Take 100 mcg by mouth daily before breakfast. ) 90 tablet 3  . metFORMIN (GLUCOPHAGE) 500 MG tablet Take 750 mg by mouth daily with breakfast.     . metolazone (ZAROXOLYN) 2.5 MG tablet TAKE 1 TABLET (2.5 MG TOTAL) BY MOUTH 2 (TWO) TIMES A WEEK. AND THURSDAY 36 tablet 2  . polyethylene glycol powder (GLYCOLAX/MIRALAX) powder Take 17 g by mouth daily as needed for moderate constipation.    . potassium chloride SA (KLOR-CON M20) 20 MEQ tablet TAKE 3 TABS EVERY MORNING, 2 TABS AT NOON AND 2 TABS EVERY EVENING 630 tablet 1  . spironolactone (ALDACTONE) 25 MG tablet Take 1 tablet (25 mg total) by mouth daily. 30 tablet 3  . tamsulosin (FLOMAX) 0.4 MG CAPS capsule Take 0.4 mg by mouth.    . torsemide (DEMADEX) 20 MG tablet Take 5 tablets (100 mg total) by mouth 2 (two) times daily. 300 tablet 11  . triamcinolone ointment (KENALOG) 0.1 % Apply 1 application topically 2 (two) times daily. 80 g 1  . warfarin  (COUMADIN) 5 MG tablet TAKE 1 & 1/2 TABLETS DAILY EXCEPT 1 TABLET ON TUES AND THURS OR AS DIRECTED BY COUMADIN CLINIC. 140 tablet 0   No facility-administered medications prior to visit.    ROS: Review of Systems  Constitutional: Negative for appetite change, fatigue and unexpected weight change.  HENT: Negative for congestion, nosebleeds, sneezing, sore throat and trouble swallowing.   Eyes: Negative for itching and visual disturbance.  Respiratory: Negative for cough.   Cardiovascular: Negative for chest pain, palpitations and leg swelling.  Gastrointestinal: Negative for abdominal distention, blood in stool, diarrhea and nausea.  Genitourinary: Negative for frequency and hematuria.  Musculoskeletal: Negative for back pain, gait problem, joint swelling and neck pain.  Skin: Positive for color change. Negative for rash.  Neurological: Negative for dizziness, tremors, speech difficulty and weakness.  Psychiatric/Behavioral: Negative for agitation, dysphoric mood and sleep disturbance. The patient is not nervous/anxious.     Objective:  BP 122/62 (BP Location: Right Arm, Patient Position: Sitting, Cuff Size: Large)   Pulse 77   Temp 98.3 F (36.8 C) (Oral)   Ht 6\' 2"  (1.88 m)   Wt 229 lb (103.9 kg)   SpO2 91%   BMI 29.40 kg/m   BP Readings from Last 3 Encounters:  03/25/19 122/62  01/02/19 132/70  12/25/18 118/66    Wt Readings from Last  3 Encounters:  03/25/19 229 lb (103.9 kg)  01/02/19 219 lb 12.8 oz (99.7 kg)  12/25/18 222 lb (100.7 kg)    Physical Exam Constitutional:      General: He is not in acute distress.    Appearance: He is well-developed.     Comments: NAD  Eyes:     Conjunctiva/sclera: Conjunctivae normal.     Pupils: Pupils are equal, round, and reactive to light.  Neck:     Thyroid: No thyromegaly.     Vascular: No JVD.  Cardiovascular:     Rate and Rhythm: Normal rate and regular rhythm.     Heart sounds: Normal heart sounds. No murmur. No  friction rub. No gallop.   Pulmonary:     Effort: Pulmonary effort is normal. No respiratory distress.     Breath sounds: Normal breath sounds. No wheezing or rales.  Chest:     Chest wall: No tenderness.  Abdominal:     General: Bowel sounds are normal. There is no distension.     Palpations: Abdomen is soft. There is no mass.     Tenderness: There is no abdominal tenderness. There is no guarding or rebound.  Musculoskeletal:        General: No tenderness. Normal range of motion.     Cervical back: Normal range of motion.  Lymphadenopathy:     Cervical: No cervical adenopathy.  Skin:    General: Skin is warm and dry.     Findings: No rash.  Neurological:     Mental Status: He is alert and oriented to person, place, and time.     Cranial Nerves: No cranial nerve deficit.     Motor: No abnormal muscle tone.     Coordination: Coordination normal.     Gait: Gait normal.     Deep Tendon Reflexes: Reflexes are normal and symmetric.  Psychiatric:        Behavior: Behavior normal.        Thought Content: Thought content normal.        Judgment: Judgment normal.    Several bruises and 2 hematomas on LS back   Lab Results  Component Value Date   WBC 4.7 09/24/2018   HGB 12.9 (L) 09/24/2018   HCT 37.9 (L) 09/24/2018   PLT 131.0 (L) 09/24/2018   GLUCOSE 133 (H) 12/24/2018   CHOL 129 05/27/2018   TRIG 69 05/27/2018   HDL 51 05/27/2018   LDLCALC 64 05/27/2018   ALT 14 12/03/2018   AST 27 12/03/2018   NA 135 12/24/2018   K 3.6 12/24/2018   CL 92 (L) 12/24/2018   CREATININE 2.17 (H) 12/24/2018   BUN 82 (H) 12/24/2018   CO2 28 12/24/2018   TSH 8.24 (H) 09/24/2018   PSA 2.72 09/24/2018   INR 3.3 (A) 02/25/2019   HGBA1C 5.7 07/01/2018   MICROALBUR 6.4 (H) 10/15/2017    No results found.  Assessment & Plan:   There are no diagnoses linked to this encounter.   No orders of the defined types were placed in this encounter.    Follow-up: No follow-ups on file.  Walker Kehr, MD

## 2019-03-25 NOTE — Assessment & Plan Note (Addendum)
Labs Metformin 

## 2019-03-25 NOTE — Assessment & Plan Note (Signed)
Mild, recurrent Labs

## 2019-03-25 NOTE — Assessment & Plan Note (Signed)
Demadex, Spironolactone 

## 2019-03-25 NOTE — Addendum Note (Signed)
Addended by: Leeanne Rio on: 03/25/2019 09:54 AM   Modules accepted: Orders

## 2019-03-26 ENCOUNTER — Ambulatory Visit (INDEPENDENT_AMBULATORY_CARE_PROVIDER_SITE_OTHER): Payer: Medicare Other | Admitting: *Deleted

## 2019-03-26 ENCOUNTER — Ambulatory Visit: Payer: Medicare Other | Admitting: Internal Medicine

## 2019-03-26 DIAGNOSIS — Z952 Presence of prosthetic heart valve: Secondary | ICD-10-CM | POA: Diagnosis not present

## 2019-03-26 DIAGNOSIS — I480 Paroxysmal atrial fibrillation: Secondary | ICD-10-CM | POA: Diagnosis not present

## 2019-03-26 NOTE — Patient Instructions (Signed)
Description   Spoke with pt and instructed pt to hold today's dose then continue taking 1.5 tablets daily except 1 tablet on Tuesdays, Thursdays, and Saturdays. Recheck INR in 2-3 weeks (normally 8 weeks). Call coumadin clinic for any questions [phone].

## 2019-04-01 ENCOUNTER — Encounter (HOSPITAL_COMMUNITY): Payer: Medicare Other | Admitting: Cardiology

## 2019-04-03 ENCOUNTER — Telehealth: Payer: Self-pay | Admitting: *Deleted

## 2019-04-03 NOTE — Telephone Encounter (Signed)
Virtual Visit Pre-Appointment Phone Call  "(Name), I am calling you today to discuss your upcoming appointment. We are currently trying to limit exposure to the virus that causes COVID-19 by seeing patients at home rather than in the office."  1. "What is the BEST phone number to call the day of the visit?" - include this in appointment notes  2. "Do you have or have access to (through a family member/friend) a smartphone with video capability that we can use for your visit?" a. If yes - list this number in appt notes as "cell" (if different from BEST phone #) and list the appointment type as a VIDEO visit in appointment notes b. If no - list the appointment type as a PHONE visit in appointment notes  Confirm consent - "In the setting of the current Covid19 crisis, you are scheduled for a (phone or video) visit with your provider on (date) at (time).  Just as we do with many in-office visits, in order for you to participate in this visit, we must obtain consent.  If you'd like, I can send this to your mychart (if signed up) or email for you to review.  Otherwise, I can obtain your verbal consent now.  All virtual visits are billed to your insurance company just like a normal visit would be.  By agreeing to a virtual visit, we'd like you to understand that the technology does not allow for your provider to perform an examination, and thus may limit your provider's ability to fully assess your condition. If your provider identifies any concerns that need to be evaluated in person, we will make arrangements to do so.  Finally, though the technology is pretty good, we cannot assure that it will always work on either your or our end, and in the setting of a video visit, we may have to convert it to a phone-only visit.  In either situation, we cannot ensure that we have a secure connection.  Are you willing to proceed?" STAFF: Did the patient verbally acknowledge consent to telehealth visit? Document  YES/NO here: YES 3. Advise patient to be prepared - "Two hours prior to your appointment, go ahead and check your blood pressure, pulse, oxygen saturation, and your weight (if you have the equipment to check those) and write them all down. When your visit starts, your provider will ask you for this information. If you have an Apple Watch or Kardia device, please plan to have heart rate information ready on the day of your appointment. Please have a pen and paper handy nearby the day of the visit as well."  4. Give patient instructions for MyChart download to smartphone OR Doximity/Doxy.me as below if video visit (depending on what platform provider is using)  5. Inform patient they will receive a phone call 15 minutes prior to their appointment time (may be from unknown caller ID) so they should be prepared to answer    TELEPHONE CALL NOTE  [name] has been deemed a candidate for a follow-up tele-health visit to limit community exposure during the Covid-19 pandemic. I spoke with the patient via phone to ensure availability of phone/video source, confirm preferred email & phone number, and discuss instructions and expectations.  I reminded [name] to be prepared with any vital sign and/or heart rhythm information that could potentially be obtained via home monitoring, at the time of his visit. I reminded [name] to expect a phone call prior to his visit.  Sydell Axon  Nyoka Cowden, [facility] 04/03/2019 9:54 AM   INSTRUCTIONS FOR DOWNLOADING THE MYCHART APP TO SMARTPHONE  - The patient must first make sure to have activated MyChart and know their login information - If Apple, go to [employer] and type in MyChart in the search bar and download the app. If Android, ask patient to go to [employer] and type in [place] in the search bar and download the app. The app is free but as with any other app downloads, their phone may require them to verify saved payment information or  Apple/Android password.  - The patient will need to then log into the app with their MyChart username and password, and select [facility] as their healthcare provider to link the account. When it is time for your visit, go to the MyChart app, find appointments, and click Begin Video Visit. Be sure to Select Allow for your device to access the Microphone and Camera for your visit. You will then be connected, and your provider will be with you shortly.  **If they have any issues connecting, or need assistance please contact MyChart service desk [phone])**  **If using a computer, in order to ensure the best quality for their visit they will need to use either of the following Internet Browsers: [employer], or Google Chrome**  IF USING DOXIMITY or DOXY.ME - The patient will receive a link just prior to their visit by text.     FULL LENGTH CONSENT FOR TELE-HEALTH VISIT   I hereby voluntarily request, consent and authorize [place] and its employed or contracted physicians, physician assistants, nurse practitioners or other licensed health care professionals (the Practitioner), to provide me with telemedicine health care services (the "Services") as deemed necessary by the treating Practitioner. I acknowledge and consent to receive the Services by the Practitioner via telemedicine. I understand that the telemedicine visit will involve communicating with the Practitioner through live audiovisual communication technology and the disclosure of certain medical information by electronic transmission. I acknowledge that I have been given the opportunity to request an in-person assessment or other available alternative prior to the telemedicine visit and am voluntarily participating in the telemedicine visit.  I understand that I have the right to withhold or withdraw my consent to the use of telemedicine in the course of my care at any time, without affecting my right to future care  or treatment, and that the Practitioner or I may terminate the telemedicine visit at any time. I understand that I have the right to inspect all information obtained and/or recorded in the course of the telemedicine visit and may receive copies of available information for a reasonable fee.  I understand that some of the potential risks of receiving the Services via telemedicine include:  Marland Kitchen Delay or interruption in medical evaluation due to technological equipment failure or disruption; . Information transmitted may not be sufficient (e.g. poor resolution of images) to allow for appropriate medical decision making by the Practitioner; and/or  . In rare instances, security protocols could fail, causing a breach of personal health information.  Furthermore, I acknowledge that it is my responsibility to provide information about my medical history, conditions and care that is complete and accurate to the best of my ability. I acknowledge that Practitioner's advice, recommendations, and/or decision may be based on factors not within their control, such as incomplete or inaccurate data provided by me or distortions of diagnostic images or specimens that may result from electronic transmissions. I understand that the practice  of medicine is not an Chief Strategy Officer and that Practitioner makes no warranties or guarantees regarding treatment outcomes. I acknowledge that I will receive a copy of this consent concurrently upon execution via email to the email address I last provided but may also request a printed copy by calling the office of [place].    I understand that my insurance will be billed for this visit.   I have read or had this consent read to me. . I understand the contents of this consent, which adequately explains the benefits and risks of the Services being provided via telemedicine.  . I have been provided ample opportunity to ask questions regarding this consent and the Services and have had  my questions answered to my satisfaction. . I give my informed consent for the services to be provided through the use of telemedicine in my medical care  By participating in this telemedicine visit I agree to the above.

## 2019-04-08 ENCOUNTER — Telehealth (INDEPENDENT_AMBULATORY_CARE_PROVIDER_SITE_OTHER): Payer: Medicare Other | Admitting: Cardiology

## 2019-04-08 ENCOUNTER — Other Ambulatory Visit: Payer: Self-pay

## 2019-04-08 VITALS — BP 122/62 | Ht 74.0 in | Wt 221.0 lb

## 2019-04-08 DIAGNOSIS — G4733 Obstructive sleep apnea (adult) (pediatric): Secondary | ICD-10-CM

## 2019-04-08 DIAGNOSIS — I10 Essential (primary) hypertension: Secondary | ICD-10-CM

## 2019-04-08 DIAGNOSIS — E669 Obesity, unspecified: Secondary | ICD-10-CM | POA: Diagnosis not present

## 2019-04-08 NOTE — Addendum Note (Signed)
Addended by: Antonieta Iba on: 04/08/2019 09:48 AM   Modules accepted: Orders

## 2019-04-08 NOTE — Patient Instructions (Signed)
Medication Instructions:  Your physician recommends that you continue on your current medications as directed. Please refer to the Current Medication list given to you today.  *If you need a refill on your cardiac medications before your next appointment, please call your pharmacy*  Follow-Up: At [facility], you and your health needs are our priority.  As part of our continuing mission to provide you with exceptional heart care, we have created designated Provider Care Teams.  These Care Teams include your primary Cardiologist (physician) and Advanced Practice Providers (APPs -  Physician Assistants and Nurse Practitioners) who all work together to provide you with the care you need, when you need it.  Your next appointment:   3 month(s)  The format for your next appointment:   Either In Person or Virtual  Provider:   Fransico Him, MD

## 2019-04-08 NOTE — Progress Notes (Signed)
Virtual Visit via Video Note   This visit type was conducted due to national recommendations for restrictions regarding the COVID-19 Pandemic (e.g. social distancing) in an effort to limit this patient's exposure and mitigate transmission in our community.  Due to his co-morbid illnesses, this patient is at least at moderate risk for complications without adequate follow up.  This format is felt to be most appropriate for this patient at this time.  All issues noted in this document were discussed and addressed.  A limited physical exam was performed with this format.  Please refer to the patient's chart for his consent to telehealth for [facility].  Evaluation Performed:  Follow-up visit  This visit type was conducted due to national recommendations for restrictions regarding the COVID-19 Pandemic (e.g. social distancing).  This format is felt to be most appropriate for this patient at this time.  All issues noted in this document were discussed and addressed.  No physical exam was performed (except for noted visual exam findings with Video Visits).  Please refer to the patient's chart (MyChart message for video visits and phone note for telephone visits) for the patient's consent to telehealth for [facility].  Date:  04/08/2019   ID:  [name], DOB [birth year], MRN [id number]  Patient Location:  Home  Provider location:   [place]  PCP:  Cassandria Anger, MD  Cardiologist:  [name], MD  Electrophysiologist:  None   Chief Complaint:  OSA  History of Present Illness:    [name] is a 84 y.o. male who presents via audio/video conferencing for a telehealth visit today.    [name] a 84 y.o.malewith a hx of OSAon PAP, obesity and HTN.When I last saw him he was complainng of his device not holding water and given that his device was old requested that we order a new PAP device.  Unfortunately he has had problems with the DME in getting a new PCP  device.  He has gotten his new PAP device and is here for followup per insurance requirements to document compliance.   Unfortunately he has not been using his device because every time he uses it he gets a nose bleed.  His last nose bleed was last week and has not used his PAP device since getting it.   The patient does not have symptoms concerning for COVID-19 infection (fever, chills, cough, or new shortness of breath).   Prior CV studies:   The following studies were reviewed today:  PAP compliance download  Past Medical History:  Diagnosis Date  . Carotid artery disease ([place]) 1994   s/p left carotid endarerectomy   . Chronic atrial fibrillation (HCC)    a. on coumadin   . Chronic diastolic CHF (congestive heart failure) ([place])   . Diabetes mellitus without complication ([place])    dx 2016  . Dyspnea   . Epistaxis   . Heart murmur   . Hx of CABG    a. 1994  . Hypertension   . OSA (obstructive sleep apnea)   . Rheumatic fever   . S/P AVR (aortic valve replacement)    a. mechanical valve 1996  . Subclavian bypass stenosis ([place])   . 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 Vitamin B12 deficiency    Past Surgical History:  Procedure Laterality Date  . artificial valve    . [place]   replaced due to  aortic stenosis, [employer] mechanical prostesis  . CAROTID ENDARTERECTOMY Left 1997   subclavian bypass Done in [place]  . CORONARY ARTERY BYPASS GRAFT  1994   w SVG to RCA and SVG to circumflex  . heart bypass     Done in [place]  . MULTIPLE EXTRACTIONS WITH ALVEOLOPLASTY Bilateral 12/12/2017   Procedure: Extraction of tooth #'s 1, 12,13,14,15,17,18,19, 29, and 30 with alveoloplasty and gross debridement of remaining teeth`;  Surgeon: Lenn Cal, DDS;  Location: [place];  Service: Oral Surgery;  Laterality: Bilateral;  MULTIPLE EXTRACTION WITH ALVEOLOPLASTY WITH PRE PROSTHETIC SURGERY AND GROSS  DEBRIDEMENT OF REMAINING TEETH  . RIGHT HEART CATH AND CORONARY/GRAFT ANGIOGRAPHY N/A 04/30/2018   Procedure: RIGHT HEART CATH AND CORONARY/GRAFT ANGIOGRAPHY;  Surgeon: Larey Dresser, MD;  Location: [place] CV LAB;  Service: Cardiovascular;  Laterality: N/A;  . TEE WITHOUT CARDIOVERSION N/A 10/23/2017   Procedure: TRANSESOPHAGEAL ECHOCARDIOGRAM (TEE);  Surgeon: Larey Dresser, MD;  Location[facility] ENDOSCOPY;  Service: Cardiovascular;  Laterality: N/A;  . TONSILLECTOMY       Current Meds  Medication Sig  . acetaminophen (TYLENOL) 500 MG tablet Take 1,000 mg by mouth every 6 (six) hours as needed for moderate pain or headache.  . allopurinol (ZYLOPRIM) 100 MG tablet Take 1 tablet daily  . amiodarone (PACERONE) 200 MG tablet Take 1 tablet (200 mg total) by mouth daily.  . carvedilol (COREG) 3.125 MG tablet Take 1 tablet (3.125 mg total) by mouth 2 (two) times daily.  Marland Kitchen ezetimibe-simvastatin (VYTORIN) 10-20 MG tablet TAKE 1 TABLET BY MOUTH EVERYDAY AT BEDTIME  . finasteride (PROSCAR) 5 MG tablet Take 5 mg by mouth daily.  Marland Kitchen levothyroxine (SYNTHROID, LEVOTHROID) 50 MCG tablet Take 1 tablet (50 mcg total) by mouth daily before breakfast.  . metFORMIN (GLUCOPHAGE) 500 MG tablet Take 750 mg by mouth daily with breakfast.   . metolazone (ZAROXOLYN) 2.5 MG tablet TAKE 1 TABLET (2.5 MG TOTAL) BY MOUTH 2 (TWO) TIMES A WEEK. AND THURSDAY  . potassium chloride SA (KLOR-CON M20) 20 MEQ tablet TAKE 3 TABS EVERY MORNING, 2 TABS AT NOON AND 2 TABS EVERY EVENING  . spironolactone (ALDACTONE) 25 MG tablet Take 1 tablet (25 mg total) by mouth daily.  . tamsulosin (FLOMAX) 0.4 MG CAPS capsule Take 0.4 mg by mouth.  . torsemide (DEMADEX) 20 MG tablet Take 5 tablets (100 mg total) by mouth 2 (two) times daily.  Marland Kitchen triamcinolone ointment (KENALOG) 0.1 % Apply 1 application topically 2 (two) times daily.  Marland Kitchen warfarin (COUMADIN) 5 MG tablet TAKE 1 & 1/2 TABLETS DAILY EXCEPT 1 TABLET ON TUES AND THURS OR AS DIRECTED  BY COUMADIN CLINIC.     Allergies:   Rifampin and Vancomycin   Social History   Tobacco Use  . Smoking status: Former Smoker    Packs/day: 1.00    Years: 30.00    Pack years: 30.00    Types: Cigarettes    Quit date: 04/03/1992    Years since quitting: 27.0  . Smokeless tobacco: Never Used  Substance Use Topics  . Alcohol use: Yes    Comment: 4-6 ounces of wine daily  . Drug use: No    Comment: Half a cup a day.     Family Hx: The patient's family history includes Cancer in his father; Hypertension in his mother.  ROS:   Please see the history of present illness.     All other systems reviewed and are negative.   Labs/Other Tests and Data  Reviewed:    Recent Labs: 08/19/2018: Magnesium 2.3 09/24/2018: TSH 8.24 03/25/2019: ALT 11; BUN 80; Creatinine, Ser 2.27; Hemoglobin 11.3; Platelets 111.0; Potassium 3.7; Sodium 137   Recent Lipid Panel Lab Results  Component Value Date/Time   CHOL 129 05/27/2018 03:30 PM   TRIG 69 05/27/2018 03:30 PM   HDL 51 05/27/2018 03:30 PM   CHOLHDL 2.5 05/27/2018 03:30 PM   LDLCALC 64 05/27/2018 03:30 PM    Wt Readings from Last 3 Encounters:  04/08/19 221 lb (100.2 kg)  03/25/19 229 lb (103.9 kg)  01/02/19 219 lb 12.8 oz (99.7 kg)     Objective:    Vital Signs:  BP 122/62 Comment: March 25, 2019 from PCP office  Ht 6\' 2"  (1.88 m)   Wt 221 lb (100.2 kg)   BMI 28.37 kg/m     ASSESSMENT & PLAN:    1.  OSA - He has gotten a new device but has not been using it due to nose bleeds recently.  He has had problems with nose bleeds in the past.  He has been seeing Dr. Redmond Baseman in the past so I will refer him back to see if there is anything that can be done so that he can restart his PAP device.   2.  HTN -BP controlled on exam -continue Carvedilol 3.125mg  BID and Spiro 25mg  daily  3.  Obesity -His BMI is now below 30.  I have encouraged him to continue with his exercise program and cut back on carbs and portions.   COVID-19  Education: The signs and symptoms of COVID-19 were discussed with the patient and how to seek care for testing (follow up with PCP or arrange E-visit).  The importance of social distancing was discussed today.  Patient Risk:   After full review of this patient's clinical status, I feel that they are at least moderate risk at this time.  Time:   Today, I have spent 20 minutes directly with the patient on telemedicine discussing medical problems including OSA< HTN, obesity.  We also reviewed the symptoms of COVID 19 and the ways to protect against contracting the virus with telehealth technology.  I spent an additional 5 minutes reviewing patient's chart including PAP compliance download.  Medication Adjustments/Labs and Tests Ordered: Current medicines are reviewed at length with the patient today.  Concerns regarding medicines are outlined above.  Tests Ordered: No orders of the defined types were placed in this encounter.  Medication Changes: No orders of the defined types were placed in this encounter.   Disposition:  Follow up in 3 months  Signed, Fransico Him, MD  04/08/2019 9:15 AM    [place]

## 2019-04-09 ENCOUNTER — Other Ambulatory Visit: Payer: Self-pay | Admitting: Interventional Cardiology

## 2019-04-15 ENCOUNTER — Ambulatory Visit (INDEPENDENT_AMBULATORY_CARE_PROVIDER_SITE_OTHER): Payer: Medicare Other | Admitting: *Deleted

## 2019-04-15 ENCOUNTER — Other Ambulatory Visit: Payer: Self-pay

## 2019-04-15 DIAGNOSIS — Z952 Presence of prosthetic heart valve: Secondary | ICD-10-CM | POA: Diagnosis not present

## 2019-04-15 DIAGNOSIS — I4891 Unspecified atrial fibrillation: Secondary | ICD-10-CM

## 2019-04-15 LAB — POCT INR: INR: 3.7 — AB (ref 2.0–3.0)

## 2019-04-15 NOTE — Patient Instructions (Signed)
Description   Hold today's dose then start taking 1 tablet daily except 1.5 tablets on Mondays, Wednesdays, and Fridays. Recheck INR in 3 weeks (normally 8 weeks). Call coumadin clinic for any questions [phone].

## 2019-04-21 ENCOUNTER — Ambulatory Visit (HOSPITAL_COMMUNITY)
Admission: RE | Admit: 2019-04-21 | Discharge: 2019-04-21 | Disposition: A | Discharge status: Home / Self Care | Payer: Medicare Other | Source: Ambulatory Visit | Attending: Cardiology | Admitting: Cardiology

## 2019-04-21 ENCOUNTER — Other Ambulatory Visit: Payer: Self-pay

## 2019-04-21 ENCOUNTER — Encounter (HOSPITAL_COMMUNITY): Payer: Self-pay

## 2019-04-21 VITALS — BP 124/63 | HR 59 | Wt 233.8 lb

## 2019-04-21 DIAGNOSIS — Z7901 Long term (current) use of anticoagulants: Secondary | ICD-10-CM | POA: Insufficient documentation

## 2019-04-21 DIAGNOSIS — D693 Immune thrombocytopenic purpura: Secondary | ICD-10-CM | POA: Diagnosis not present

## 2019-04-21 DIAGNOSIS — N183 Chronic kidney disease, stage 3 unspecified: Secondary | ICD-10-CM | POA: Diagnosis not present

## 2019-04-21 DIAGNOSIS — Z881 Allergy status to other antibiotic agents status: Secondary | ICD-10-CM | POA: Insufficient documentation

## 2019-04-21 DIAGNOSIS — J449 Chronic obstructive pulmonary disease, unspecified: Secondary | ICD-10-CM | POA: Insufficient documentation

## 2019-04-21 DIAGNOSIS — I5022 Chronic systolic (congestive) heart failure: Secondary | ICD-10-CM | POA: Diagnosis not present

## 2019-04-21 DIAGNOSIS — Z7984 Long term (current) use of oral hypoglycemic drugs: Secondary | ICD-10-CM | POA: Diagnosis not present

## 2019-04-21 DIAGNOSIS — Z801 Family history of malignant neoplasm of trachea, bronchus and lung: Secondary | ICD-10-CM | POA: Insufficient documentation

## 2019-04-21 DIAGNOSIS — I2510 Atherosclerotic heart disease of native coronary artery without angina pectoris: Secondary | ICD-10-CM | POA: Insufficient documentation

## 2019-04-21 DIAGNOSIS — Z79890 Hormone replacement therapy (postmenopausal): Secondary | ICD-10-CM | POA: Diagnosis not present

## 2019-04-21 DIAGNOSIS — I5023 Acute on chronic systolic (congestive) heart failure: Secondary | ICD-10-CM

## 2019-04-21 DIAGNOSIS — I482 Chronic atrial fibrillation, unspecified: Secondary | ICD-10-CM | POA: Insufficient documentation

## 2019-04-21 DIAGNOSIS — H538 Other visual disturbances: Secondary | ICD-10-CM | POA: Diagnosis not present

## 2019-04-21 DIAGNOSIS — Z9119 Patient's noncompliance with other medical treatment and regimen: Secondary | ICD-10-CM | POA: Insufficient documentation

## 2019-04-21 DIAGNOSIS — Z87891 Personal history of nicotine dependence: Secondary | ICD-10-CM | POA: Diagnosis not present

## 2019-04-21 DIAGNOSIS — G4733 Obstructive sleep apnea (adult) (pediatric): Secondary | ICD-10-CM | POA: Insufficient documentation

## 2019-04-21 DIAGNOSIS — Z79899 Other long term (current) drug therapy: Secondary | ICD-10-CM | POA: Insufficient documentation

## 2019-04-21 DIAGNOSIS — Z8249 Family history of ischemic heart disease and other diseases of the circulatory system: Secondary | ICD-10-CM | POA: Diagnosis not present

## 2019-04-21 DIAGNOSIS — Z951 Presence of aortocoronary bypass graft: Secondary | ICD-10-CM | POA: Diagnosis not present

## 2019-04-21 DIAGNOSIS — Z952 Presence of prosthetic heart valve: Secondary | ICD-10-CM | POA: Insufficient documentation

## 2019-04-21 DIAGNOSIS — E1122 Type 2 diabetes mellitus with diabetic chronic kidney disease: Secondary | ICD-10-CM | POA: Insufficient documentation

## 2019-04-21 LAB — HEPATIC FUNCTION PANEL
ALT: 12 U/L (ref 0–44)
AST: 20 U/L (ref 15–41)
Albumin: 3.8 g/dL (ref 3.5–5.0)
Alkaline Phosphatase: 74 U/L (ref 38–126)
Bilirubin, Direct: 0.2 mg/dL (ref 0.0–0.2)
Indirect Bilirubin: 0.8 mg/dL (ref 0.3–0.9)
Total Bilirubin: 1 mg/dL (ref 0.3–1.2)
Total Protein: 6.9 g/dL (ref 6.5–8.1)

## 2019-04-21 LAB — BASIC METABOLIC PANEL
Anion gap: 14 (ref 5–15)
BUN: 84 mg/dL — ABNORMAL HIGH (ref 8–23)
CO2: 27 mmol/L (ref 22–32)
Calcium: 9.2 mg/dL (ref 8.9–10.3)
Chloride: 97 mmol/L — ABNORMAL LOW (ref 98–111)
Creatinine, Ser: 2.45 mg/dL — ABNORMAL HIGH (ref 0.61–1.24)
GFR calc Af Amer: 27 mL/min — ABNORMAL LOW (ref >60)
GFR calc non Af Amer: 24 mL/min — ABNORMAL LOW (ref >60)
Glucose, Bld: 124 mg/dL — ABNORMAL HIGH (ref 70–99)
Potassium: 3.6 mmol/L (ref 3.5–5.1)
Sodium: 138 mmol/L (ref 135–145)

## 2019-04-21 LAB — TSH: TSH: 13.556 u[IU]/mL — ABNORMAL HIGH (ref 0.350–4.500)

## 2019-04-21 LAB — LIPID PANEL
Cholesterol: 131 mg/dL (ref 0–200)
HDL: 54 mg/dL (ref >40)
LDL Cholesterol: 64 mg/dL (ref 0–99)
Total CHOL/HDL Ratio: 2.4 RATIO
Triglycerides: 65 mg/dL (ref <150)
VLDL: 13 mg/dL (ref 0–40)

## 2019-04-21 LAB — CBC
HCT: 32.4 % — ABNORMAL LOW (ref 39.0–52.0)
Hemoglobin: 10.5 g/dL — ABNORMAL LOW (ref 13.0–17.0)
MCH: 38.2 pg — ABNORMAL HIGH (ref 26.0–34.0)
MCHC: 32.4 g/dL (ref 30.0–36.0)
MCV: 117.8 fL — ABNORMAL HIGH (ref 80.0–100.0)
Platelets: 89 10*3/uL — ABNORMAL LOW (ref 150–400)
RBC: 2.75 MIL/uL — ABNORMAL LOW (ref 4.22–5.81)
RDW: 16.3 % — ABNORMAL HIGH (ref 11.5–15.5)
WBC: 3.2 10*3/uL — ABNORMAL LOW (ref 4.0–10.5)
nRBC: 0 % (ref 0.0–0.2)

## 2019-04-21 LAB — T4, FREE: Free T4: 0.91 ng/dL (ref 0.61–1.12)

## 2019-04-21 MED ORDER — METOLAZONE 2.5 MG PO TABS: 2.5000 mg | ORAL_TABLET | ORAL | 3 refills | Status: DC

## 2019-04-21 NOTE — Progress Notes (Signed)
Advanced Heart Failure Clinic Note   PCP: [name], [name], MD PCP-Cardiologist: [name], MD  HF: [name]: Dr. Radford Pax   HPI: [name] is a 84 y.o. male with h/o mechanical AVR 1994, OSA on CPAP, Chronic afib, chronic combined CHF, mitral regurgitation, CKD III, chronic coumadin therapy, CAD s/p CAGB 1994, and COPD.   Admitted 7/18-11/05/17 from [facility] office with A/C diastolic HF. Diuresed 83 lbs with lasix drip, metolazone, and diamox. Transitioned to torsemide 80 mg BID. Determined not to be a candidate for MitraClip. Dr [name] also consulted and recommended aggressive medical therapy versus  percutaneous MVR trial as he felt very high risk for conventional surgery. Course complicated by drop in hemoglobin and symptomatic hypotension, requiring 1 unit pRBCs. GI consulted and did not recommend scoping. BB and spiro were stopped due to low BPs. Had some AKI assoicated with low BP, but resolved by discharge. Required lovenox bridge at DC with subtherapeutic INR. Discharged with [facility]. DC weight: 216 lbs.   Admitted 9/11 - 12/14/17 for dental extractions in setting of work up for MVR. Hospital course complicated by run of VT in the setting of hypokalemia. Started on amiodarone with h/o of arrhythmia. Bridged with lovenox.   Admitted 9/22 - 12/28/17 with symptomatic anemia from bleeding gums. Bleeding stopped with holding heparin and coumadin. Received multiple units of blood.   He was admitted again in 10/19 with left thigh hematoma and went from there to inpatient rehab.   He has been evaluated in [place] for a transcatheter mitral valve.  RHC/LHC was done in 1/20 as part of his pre-op evaluation.  Filling pressures and cardiac output were well-compensated, he had patent SVG-PDA.  Echo in 1/20 showed EF 40-45%, moderate RV dilation with normal systolic function, severe MR with likely rheumatic mitral valve, normal St Jude mechanical aortic valve. Unfortunately, it was  determined that he would not qualify for any of the percutaneous mitral valve studies.    Returns for clinic f/u today. Wt up ~10 by both clinic and home scales. Admits that he has missed a few doses of torsemide recently. Compliant w/ all other meds. Reports stable symptoms. No increased exertional dyspnea, stable NYHA II-III symptoms. Has noticed increased LEE. Denies CP. No abnormal bleeding w/ coumadin. Denies falls. Non compliant w/ CPAP due to recent nose bleeds. Feels that CPAP may be contributing. Dr. Radford Pax aware, per his report. He has f/u w/ ENT this week. Also reports blurry vision, L>R. He has a scheduled eye exam this week. His BP prior to AM meds is 124/63.    Labs (10/19): hgb 9.2 Labs (11/19): K 3.2, creatinine 1.31 Labs (1/20): K 4.5, creatinine 1.38 Labs (2/20): LDL 64 Labs (6/20): K 4.1, creatinine 1.8, hgb 12.9 Labs (12/24/18): K 3.6 Creatinine 2.2  ROS:  All systems reviewed and negative except as per HPI.    PMH: 1. Carotid stenosis: S/p left CEA in 1994.  2. Atrial fibrillation: Chronic.  He is on warfarin with mechanical aortic valve.  3. Type II diabetes.  4. COPD: PFTs (11/19) with moderate obstructive airways disease and severely decreased DLCO.  5. OSA: Uses CPAP.  6. 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  7. ITP 8. VT: On amiodarone.  9. CKD: Stage 3.  10. 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  11. Chronic systolic CHF: TEE (5/46) with EF 55-60%, D-shaped septum, mildly dilated  RV with mildly decreased systolic function, mechanical aortic valve, rheumatic-appearing mitral valve with severe MR, minimal stenosis.  - RHC (1/20): mean RA 6, PA 54/20, mean PCWP 15, CI 3.42, PVR 2.1 WU, PAPI 5.2 - Echo (1/20): EF 40-45%, moderate RV dilation with normal systolic function, severe biatrial enlargement, severe rheumatic MR, St Jude mechanical aortic valve  functioning normally.  12. ABIs (12/18): Normal.  13. CAD: S/p CABG 1994.  - LHC (1/20): 50% D1, 60% ostial LCx, totally occluded RCA with patent SVG-PDA.     Current Outpatient Medications  Medication Sig Dispense Refill  . acetaminophen (TYLENOL) 500 MG tablet Take 1,000 mg by mouth every 6 (six) hours as needed for moderate pain or headache.    . allopurinol (ZYLOPRIM) 100 MG tablet Take 1 tablet daily 30 tablet 6  . amiodarone (PACERONE) 200 MG tablet Take 1 tablet (200 mg total) by mouth daily. 90 tablet 3  . carvedilol (COREG) 3.125 MG tablet Take 1 tablet (3.125 mg total) by mouth 2 (two) times daily. 60 tablet 6  . ezetimibe-simvastatin (VYTORIN) 10-20 MG tablet TAKE 1 TABLET BY MOUTH EVERYDAY AT BEDTIME 90 tablet 1  . finasteride (PROSCAR) 5 MG tablet Take 5 mg by mouth daily.    Marland Kitchen levothyroxine (SYNTHROID, LEVOTHROID) 50 MCG tablet Take 1 tablet (50 mcg total) by mouth daily before breakfast. 90 tablet 3  . metFORMIN (GLUCOPHAGE) 500 MG tablet Take 750 mg by mouth daily with breakfast.     . metolazone (ZAROXOLYN) 2.5 MG tablet Take 1 tablet (2.5 mg total) by mouth every Monday, Wednesday, and Friday. 40 tablet 3  . polyethylene glycol powder (GLYCOLAX/MIRALAX) powder Take 17 g by mouth daily as needed for moderate constipation.    . potassium chloride SA (KLOR-CON M20) 20 MEQ tablet TAKE 3 TABS EVERY MORNING, 2 TABS AT NOON AND 2 TABS EVERY EVENING 630 tablet 1  . spironolactone (ALDACTONE) 25 MG tablet Take 1 tablet (25 mg total) by mouth daily. 30 tablet 3  . tamsulosin (FLOMAX) 0.4 MG CAPS capsule Take 0.4 mg by mouth.    . torsemide (DEMADEX) 20 MG tablet Take 5 tablets (100 mg total) by mouth 2 (two) times daily. 300 tablet 11  . triamcinolone ointment (KENALOG) 0.1 % Apply 1 application topically 2 (two) times daily. 80 g 1  . warfarin (COUMADIN) 5 MG tablet TAKE 1 & 1/2 TABLETS DAILY EXCEPT 1 TABLET ON TUES AND THURS OR AS DIRECTED BY COUMADIN CLINIC 120 tablet 0   No  current facility-administered medications for this encounter.   Allergies  Allergen Reactions  . Rifampin Rash and Other (See Comments)    May have been caused by Vancomycin or Rifampin (??)  . Vancomycin Rash and Other (See Comments)    May have been caused by Vancomycin or Rifampin (??)   Social History   Socioeconomic History  . Marital status: Married    Spouse name: Not on file  . Number of children: 3  . Years of education: Not on file  . Highest education level: Not on file  Occupational History  . Not on file  Tobacco Use  . Smoking status: Former Smoker    Packs/day: 1.00    Years: 30.00    Pack years: 30.00    Types: Cigarettes    Quit date: 04/03/1992    Years since quitting: 27.0  . Smokeless tobacco: Never Used  Substance and Sexual Activity  . Alcohol use: Yes    Comment: 4-6 ounces of wine  daily  . Drug use: No    Comment: Half a cup a day.  Marland Kitchen Sexual activity: Not on file  Other Topics Concern  . Not on file  Social History Narrative   Patient is married and lives with his wife.   Youngest daughter lives in [place] also.   Patient has 2 other children.   Social Determinants of Health   Financial Resource Strain:   . Difficulty of Paying Living Expenses: Not on file  Food Insecurity:   . Worried About Charity fundraiser in the Last Year: Not on file  . Ran Out of Food in the Last Year: Not on file  Transportation Needs:   . Lack of Transportation (Medical): Not on file  . Lack of Transportation (Non-Medical): Not on file  Physical Activity:   . Days of Exercise per Week: Not on file  . Minutes of Exercise per Session: Not on file  Stress:   . Feeling of Stress : Not on file  Social Connections:   . Frequency of Communication with Friends and Family: Not on file  . Frequency of Social Gatherings with Friends and Family: Not on file  . Attends Religious Services: Not on file  . Active Member of Clubs or Organizations: Not on file  . Attends  Archivist Meetings: Not on file  . Marital Status: Not on file  Intimate Partner Violence:   . Fear of Current or Ex-Partner: Not on file  . Emotionally Abused: Not on file  . Physically Abused: Not on file  . Sexually Abused: Not on file    Family History  Problem Relation Age of Onset  . Cancer Father        lung   . Hypertension Mother    Vitals:   04/21/19 0901  BP: 124/63  Pulse: (!) 59  SpO2: 96%  Weight: 106.1 kg (233 lb 12.8 oz)     Wt Readings from Last 3 Encounters:  04/21/19 106.1 kg (233 lb 12.8 oz)  04/08/19 100.2 kg (221 lb)  03/25/19 103.9 kg (229 lb)   PHYSICAL EXAM: General:  Well appearing elderly WM ambulating w/ cane. No respiratory difficulty HEENT: normal Neck: supple. no JVD. Carotids 2+ bilat; no bruits. No lymphadenopathy or thyromegaly appreciated. Cor: PMI nondisplaced. Regular rate & rhythm. Crisp mechanical valve sounds. 2/6 MR murmur Lungs: clear Abdomen: soft, nontender, nondistended. No hepatosplenomegaly. No bruits or masses. Good bowel sounds. Extremities: no cyanosis, clubbing, rash, 1+ bilateral LE edema Neuro: alert & oriented x 3, cranial nerves grossly intact. moves all 4 extremities w/o difficulty. Affect pleasant.  ASSESSMENT & PLAN: 1. Chronic systolic CHF with prominent RV failure:  Echo in 1/20 with EF 40-45%, moderate RV dilation/normal function, severe rheumatic mitral regurgitation, normal mechanical aortic valve.  Severe MR likely plays a significant role in CHF and RV failure.  - Stable NYHA II-III. Volume status elevated. Wt up ~10 above dry wt, in the setting of missed doses of torsemide. We discussed importance of strict compliance.  - Continue torsemide 100 mg bid. Increase metolazone to 2.5 mg 3 days this week w/ increase supplemental K on metolazone days (take extra 40 mEq). - Continue spironolactone 25 mg daily.    - Continue Coreg 3.125 mg bid.  - Check BMP today and again in 7-10 days  - monitor Sodium  intake  2. Mechanical aortic valve: Appeared to function well on last echo. Crisp valve sounds on exam. - Continue warfarin with INR goal 2.5-3.5 (  also with atrial fibrillation).  - He is off aspirin with spontaneous thigh hematoma.  Denies abnormal bleeding. No falls. - Check CBC today  3. Atrial fibrillation: Rate controlled.  - continue coreg + Coumadin   4. CAD: s/p CABG.  No chest pain.  Cath in 1/20 with patent SVG-PDA, no interventional target.  - No ASA given stable CAD with warfarin use.   - Continue Vytorin - check FLP today  5. COPD: He is no longer using oxygen during the day.  Moderate COPD on 11/19 PFTs.   6. OSA: poor compliance w/ CPAP per above. Followed by Dr. Radford Pax 7.CKD Stage 3: Check BMP today  8. Thrombocytopenia: Chronic, has been mild/stable.   9. Severe MR: TEE 7/19 with severe MR with possible rheumatic MV (do not think MS is significant, mildly elevated mean gradient from high flow with severe MR).  Structural Heart team evaluated, not a Mitraclip candidate. Seen by Dr. [name], would be high risk for open surgery.  He was evaluated at [facility] in [place] for percutaneous mitral valve trials.  Unfortunately, he does not qualify for any of the available trials.  We will have to continue medical management.  10. VT: He is on amiodarone to suppress VT.  - denies syncope/ near syncope  - Check TFTs and HFTs today. Reminded he needs annual eye exams. Scheduled this week.   Follow up in 7-10 days to reassess volume status and repeat BMP.   Lyda Jester PA-C 04/21/2019

## 2019-04-21 NOTE — Patient Instructions (Signed)
Lab work done today. We will notify you of any abnormal lab work. No news is good news!  Lab work will need to be done again in 1 week.  INCREASE Metolazone 2.5mg  to three days, every Monday, Wednesday, and Friday. Please take an extra 38meq (2 tabs) on Monday, Wednesday, and Friday.  Please follow up with the [facility] in 3 weeks.  At the [facility], you and your health needs are our priority. As part of our continuing mission to provide you with exceptional heart care, we have created designated Provider Care Teams. These Care Teams include your primary Cardiologist (physician) and Advanced Practice Providers (APPs- Physician Assistants and Nurse Practitioners) who all work together to provide you with the care you need, when you need it.   You may see any of the following providers on your designated Care Team at your next follow up: Marland Kitchen Dr Glori Bickers . Dr Loralie Champagne . Darrick Grinder, NP . Lyda Jester, PA . Audry Riles, PharmD   Please be sure to bring in all your medications bottles to every appointment.

## 2019-04-22 LAB — T3, FREE: T3, Free: 2.5 pg/mL (ref 2.0–4.4)

## 2019-04-24 DIAGNOSIS — Z87891 Personal history of nicotine dependence: Secondary | ICD-10-CM | POA: Diagnosis not present

## 2019-04-24 DIAGNOSIS — Z7289 Other problems related to lifestyle: Secondary | ICD-10-CM | POA: Diagnosis not present

## 2019-04-24 DIAGNOSIS — G4733 Obstructive sleep apnea (adult) (pediatric): Secondary | ICD-10-CM | POA: Diagnosis not present

## 2019-04-24 DIAGNOSIS — J343 Hypertrophy of nasal turbinates: Secondary | ICD-10-CM | POA: Diagnosis not present

## 2019-04-24 DIAGNOSIS — Z7901 Long term (current) use of anticoagulants: Secondary | ICD-10-CM | POA: Diagnosis not present

## 2019-04-24 DIAGNOSIS — R040 Epistaxis: Secondary | ICD-10-CM | POA: Diagnosis not present

## 2019-04-29 ENCOUNTER — Other Ambulatory Visit (HOSPITAL_COMMUNITY): Payer: Self-pay | Admitting: Cardiology

## 2019-04-29 ENCOUNTER — Other Ambulatory Visit: Payer: Self-pay

## 2019-04-29 ENCOUNTER — Ambulatory Visit (HOSPITAL_COMMUNITY)
Admission: RE | Admit: 2019-04-29 | Discharge: 2019-04-29 | Disposition: A | Discharge status: Home / Self Care | Payer: Medicare Other | Source: Ambulatory Visit | Attending: Cardiology | Admitting: Cardiology

## 2019-04-29 DIAGNOSIS — I5023 Acute on chronic systolic (congestive) heart failure: Secondary | ICD-10-CM | POA: Diagnosis not present

## 2019-04-29 LAB — BASIC METABOLIC PANEL
Anion gap: 14 (ref 5–15)
BUN: 88 mg/dL — ABNORMAL HIGH (ref 8–23)
CO2: 28 mmol/L (ref 22–32)
Calcium: 9.1 mg/dL (ref 8.9–10.3)
Chloride: 95 mmol/L — ABNORMAL LOW (ref 98–111)
Creatinine, Ser: 2.75 mg/dL — ABNORMAL HIGH (ref 0.61–1.24)
GFR calc Af Amer: 24 mL/min — ABNORMAL LOW (ref >60)
GFR calc non Af Amer: 21 mL/min — ABNORMAL LOW (ref >60)
Glucose, Bld: 127 mg/dL — ABNORMAL HIGH (ref 70–99)
Potassium: 4.3 mmol/L (ref 3.5–5.1)
Sodium: 137 mmol/L (ref 135–145)

## 2019-04-30 ENCOUNTER — Telehealth (HOSPITAL_COMMUNITY): Payer: Self-pay

## 2019-04-30 ENCOUNTER — Ambulatory Visit: Payer: Medicare Other

## 2019-04-30 DIAGNOSIS — I5023 Acute on chronic systolic (congestive) heart failure: Secondary | ICD-10-CM

## 2019-04-30 MED ORDER — METOLAZONE 2.5 MG PO TABS: 2.5000 mg | ORAL_TABLET | ORAL | 3 refills | Status: DC

## 2019-04-30 NOTE — Telephone Encounter (Signed)
Left voicemail for call back for med changes and lab results.

## 2019-04-30 NOTE — Telephone Encounter (Signed)
-----   Message from Consuelo Pandy, [place] sent at 04/30/2019  1:04 PM EST ----- Bump in SCr after metolazone increase. Check to see if wt has improved (was up 10 lb at recent visit). Recommend that he reduce metolazone back down to 2 days a week and repeat BMP in 7 days.

## 2019-04-30 NOTE — Telephone Encounter (Signed)
Pt left vm on triage line returning call from staff. Pt was called to discuss lab work and recommendations.  Pt was made aware of his lab work and was advised to cut metolazone back down to 2 days a week.  He said his swelling has improved his legs and believes his weight is coming down. He said his weight is down about 1.5 pounds.  He took his second dose today.  He will return to clinic in 1 week to assess kidney function. appt made.

## 2019-04-30 NOTE — Addendum Note (Signed)
Addended by: Valeda Malm on: 04/30/2019 04:06 PM   Modules accepted: Orders

## 2019-05-01 DIAGNOSIS — H2513 Age-related nuclear cataract, bilateral: Secondary | ICD-10-CM | POA: Diagnosis not present

## 2019-05-01 DIAGNOSIS — E119 Type 2 diabetes mellitus without complications: Secondary | ICD-10-CM | POA: Diagnosis not present

## 2019-05-01 DIAGNOSIS — H43813 Vitreous degeneration, bilateral: Secondary | ICD-10-CM | POA: Diagnosis not present

## 2019-05-05 DIAGNOSIS — N401 Enlarged prostate with lower urinary tract symptoms: Secondary | ICD-10-CM | POA: Diagnosis not present

## 2019-05-05 DIAGNOSIS — R3912 Poor urinary stream: Secondary | ICD-10-CM | POA: Diagnosis not present

## 2019-05-05 DIAGNOSIS — R3914 Feeling of incomplete bladder emptying: Secondary | ICD-10-CM | POA: Diagnosis not present

## 2019-05-06 ENCOUNTER — Ambulatory Visit (INDEPENDENT_AMBULATORY_CARE_PROVIDER_SITE_OTHER): Payer: Medicare Other | Admitting: *Deleted

## 2019-05-06 ENCOUNTER — Other Ambulatory Visit: Payer: Self-pay

## 2019-05-06 DIAGNOSIS — I4891 Unspecified atrial fibrillation: Secondary | ICD-10-CM

## 2019-05-06 DIAGNOSIS — Z952 Presence of prosthetic heart valve: Secondary | ICD-10-CM

## 2019-05-06 DIAGNOSIS — Z5181 Encounter for therapeutic drug level monitoring: Secondary | ICD-10-CM

## 2019-05-06 LAB — POCT INR: INR: 4.9 — AB (ref 2.0–3.0)

## 2019-05-06 NOTE — Patient Instructions (Signed)
Description   Hold today and tomorrow, then start taking 1 tablet daily except 1.5 tablets on Mondays, Wednesdays, and Fridays. Recheck INR in 2 weeks (normally 8 weeks). Call coumadin clinic for any questions [phone].

## 2019-05-07 ENCOUNTER — Other Ambulatory Visit: Payer: Self-pay

## 2019-05-07 ENCOUNTER — Ambulatory Visit (HOSPITAL_COMMUNITY)
Admission: RE | Admit: 2019-05-07 | Discharge: 2019-05-07 | Disposition: A | Discharge status: Home / Self Care | Payer: Medicare Other | Source: Ambulatory Visit | Attending: Internal Medicine | Admitting: Internal Medicine

## 2019-05-07 DIAGNOSIS — I5023 Acute on chronic systolic (congestive) heart failure: Secondary | ICD-10-CM | POA: Insufficient documentation

## 2019-05-07 LAB — BASIC METABOLIC PANEL
Anion gap: 11 (ref 5–15)
BUN: 89 mg/dL — ABNORMAL HIGH (ref 8–23)
CO2: 26 mmol/L (ref 22–32)
Calcium: 9.1 mg/dL (ref 8.9–10.3)
Chloride: 98 mmol/L (ref 98–111)
Creatinine, Ser: 2.92 mg/dL — ABNORMAL HIGH (ref 0.61–1.24)
GFR calc Af Amer: 22 mL/min — ABNORMAL LOW (ref >60)
GFR calc non Af Amer: 19 mL/min — ABNORMAL LOW (ref >60)
Glucose, Bld: 140 mg/dL — ABNORMAL HIGH (ref 70–99)
Potassium: 4 mmol/L (ref 3.5–5.1)
Sodium: 135 mmol/L (ref 135–145)

## 2019-05-08 ENCOUNTER — Telehealth (HOSPITAL_COMMUNITY): Payer: Self-pay

## 2019-05-08 ENCOUNTER — Ambulatory Visit: Payer: Medicare Other | Attending: Internal Medicine

## 2019-05-08 DIAGNOSIS — Z23 Encounter for immunization: Secondary | ICD-10-CM | POA: Insufficient documentation

## 2019-05-08 MED ORDER — METOLAZONE 2.5 MG PO TABS: 2.5000 mg | ORAL_TABLET | ORAL | 3 refills | Status: DC

## 2019-05-08 NOTE — Telephone Encounter (Signed)
-----   Message from Larey Dresser, MD sent at 05/07/2019  4:20 PM EST ----- Hold next dose of metolazone and decrease metolazone to twice a week. BMET 1 week.

## 2019-05-08 NOTE — Telephone Encounter (Signed)
Spoke with patient, aware of lab results and recommendation.  Pt was scheduled to take metolazone today. Advised to hold todays dose and decrease to once a week (Monday). He has f/u appt with NP on 2/9. Pt verbalized understanding.

## 2019-05-08 NOTE — Progress Notes (Signed)
   Covid-19 Vaccination Clinic  Name:  Treylin Burtch    MRN: [id number] DOB: [birth year]  05/08/2019  Mr. Shapley was observed post Covid-19 immunization for 15 minutes without incidence. He was provided with Vaccine Information Sheet and instruction to access the V-Safe system.   Mr. Klahn was instructed to call 911 with any severe reactions post vaccine: Marland Kitchen Difficulty breathing  . Swelling of your face and throat  . A fast heartbeat  . A bad rash all over your body  . Dizziness and weakness    Immunizations Administered    Name Date Dose VIS Date Route   Pfizer COVID-19 Vaccine 05/08/2019  5:41 PM 0.3 mL 03/14/2019 Intramuscular   Manufacturer: [place]   Lot: [id number]   [place]: [id number]

## 2019-05-13 ENCOUNTER — Other Ambulatory Visit: Payer: Self-pay

## 2019-05-13 ENCOUNTER — Encounter (HOSPITAL_COMMUNITY): Payer: Self-pay

## 2019-05-13 ENCOUNTER — Telehealth (HOSPITAL_COMMUNITY): Payer: Self-pay

## 2019-05-13 ENCOUNTER — Ambulatory Visit (HOSPITAL_COMMUNITY)
Admission: RE | Admit: 2019-05-13 | Discharge: 2019-05-13 | Disposition: A | Discharge status: Home / Self Care | Payer: Medicare Other | Source: Ambulatory Visit | Attending: Adult Health | Admitting: Adult Health

## 2019-05-13 VITALS — BP 118/62 | HR 59 | Wt 235.8 lb

## 2019-05-13 DIAGNOSIS — I4891 Unspecified atrial fibrillation: Secondary | ICD-10-CM

## 2019-05-13 DIAGNOSIS — G4733 Obstructive sleep apnea (adult) (pediatric): Secondary | ICD-10-CM | POA: Insufficient documentation

## 2019-05-13 DIAGNOSIS — I5032 Chronic diastolic (congestive) heart failure: Secondary | ICD-10-CM

## 2019-05-13 DIAGNOSIS — I50812 Chronic right heart failure: Secondary | ICD-10-CM | POA: Diagnosis not present

## 2019-05-13 DIAGNOSIS — J449 Chronic obstructive pulmonary disease, unspecified: Secondary | ICD-10-CM | POA: Diagnosis not present

## 2019-05-13 DIAGNOSIS — Z7984 Long term (current) use of oral hypoglycemic drugs: Secondary | ICD-10-CM | POA: Diagnosis not present

## 2019-05-13 DIAGNOSIS — I340 Nonrheumatic mitral (valve) insufficiency: Secondary | ICD-10-CM

## 2019-05-13 DIAGNOSIS — D696 Thrombocytopenia, unspecified: Secondary | ICD-10-CM | POA: Diagnosis not present

## 2019-05-13 DIAGNOSIS — I051 Rheumatic mitral insufficiency: Secondary | ICD-10-CM | POA: Insufficient documentation

## 2019-05-13 DIAGNOSIS — Z881 Allergy status to other antibiotic agents status: Secondary | ICD-10-CM | POA: Diagnosis not present

## 2019-05-13 DIAGNOSIS — Z952 Presence of prosthetic heart valve: Secondary | ICD-10-CM | POA: Insufficient documentation

## 2019-05-13 DIAGNOSIS — Z79890 Hormone replacement therapy (postmenopausal): Secondary | ICD-10-CM | POA: Insufficient documentation

## 2019-05-13 DIAGNOSIS — Z951 Presence of aortocoronary bypass graft: Secondary | ICD-10-CM | POA: Diagnosis not present

## 2019-05-13 DIAGNOSIS — I472 Ventricular tachycardia: Secondary | ICD-10-CM | POA: Insufficient documentation

## 2019-05-13 DIAGNOSIS — I482 Chronic atrial fibrillation, unspecified: Secondary | ICD-10-CM | POA: Diagnosis not present

## 2019-05-13 DIAGNOSIS — Z87891 Personal history of nicotine dependence: Secondary | ICD-10-CM | POA: Insufficient documentation

## 2019-05-13 DIAGNOSIS — N184 Chronic kidney disease, stage 4 (severe): Secondary | ICD-10-CM

## 2019-05-13 DIAGNOSIS — Z7901 Long term (current) use of anticoagulants: Secondary | ICD-10-CM | POA: Diagnosis not present

## 2019-05-13 DIAGNOSIS — Z79899 Other long term (current) drug therapy: Secondary | ICD-10-CM | POA: Insufficient documentation

## 2019-05-13 DIAGNOSIS — I5042 Chronic combined systolic (congestive) and diastolic (congestive) heart failure: Secondary | ICD-10-CM | POA: Insufficient documentation

## 2019-05-13 DIAGNOSIS — N179 Acute kidney failure, unspecified: Secondary | ICD-10-CM | POA: Insufficient documentation

## 2019-05-13 DIAGNOSIS — Z801 Family history of malignant neoplasm of trachea, bronchus and lung: Secondary | ICD-10-CM | POA: Diagnosis not present

## 2019-05-13 DIAGNOSIS — Z8249 Family history of ischemic heart disease and other diseases of the circulatory system: Secondary | ICD-10-CM | POA: Diagnosis not present

## 2019-05-13 DIAGNOSIS — E1122 Type 2 diabetes mellitus with diabetic chronic kidney disease: Secondary | ICD-10-CM | POA: Diagnosis not present

## 2019-05-13 DIAGNOSIS — N183 Chronic kidney disease, stage 3 unspecified: Secondary | ICD-10-CM | POA: Diagnosis not present

## 2019-05-13 DIAGNOSIS — I2510 Atherosclerotic heart disease of native coronary artery without angina pectoris: Secondary | ICD-10-CM | POA: Insufficient documentation

## 2019-05-13 LAB — BASIC METABOLIC PANEL
Anion gap: 14 (ref 5–15)
BUN: 86 mg/dL — ABNORMAL HIGH (ref 8–23)
CO2: 25 mmol/L (ref 22–32)
Calcium: 9.2 mg/dL (ref 8.9–10.3)
Chloride: 98 mmol/L (ref 98–111)
Creatinine, Ser: 2.7 mg/dL — ABNORMAL HIGH (ref 0.61–1.24)
GFR calc Af Amer: 24 mL/min — ABNORMAL LOW (ref >60)
GFR calc non Af Amer: 21 mL/min — ABNORMAL LOW (ref >60)
Glucose, Bld: 126 mg/dL — ABNORMAL HIGH (ref 70–99)
Potassium: 4.4 mmol/L (ref 3.5–5.1)
Sodium: 137 mmol/L (ref 135–145)

## 2019-05-13 LAB — BRAIN NATRIURETIC PEPTIDE: B Natriuretic Peptide: 187.2 pg/mL — ABNORMAL HIGH (ref 0.0–100.0)

## 2019-05-13 MED ORDER — POTASSIUM CHLORIDE CRYS ER 20 MEQ PO TBCR: 40.0000 meq | EXTENDED_RELEASE_TABLET | Freq: Two times a day (BID) | ORAL | 2 refills | Status: DC

## 2019-05-13 NOTE — Telephone Encounter (Signed)
Reported lab results to pt. Pt verbalized understanding of results and of new med changes. Appointment made.

## 2019-05-13 NOTE — Progress Notes (Signed)
Advanced Heart Failure Clinic Note   PCP: [name], [name], MD PCP-Cardiologist: [name], MD  HF: [name] [facility]: Dr. Radford Pax   HPI: [name] is a 84 y.o. male with h/o mechanical AVR 1994, OSA on CPAP, Chronic afib, chronic combined CHF, mitral regurgitation, CKD III, chronic coumadin therapy, CAD s/p CAGB 1994, and COPD.   Admitted 7/18-11/05/17 from [facility] office with A/C diastolic HF. Diuresed 83 lbs with lasix drip, metolazone, and diamox. Transitioned to torsemide 80 mg BID. Determined not to be a candidate for MitraClip. Dr [name] also consulted and recommended aggressive medical therapy versus  percutaneous MVR trial as he felt very high risk for conventional surgery. Course complicated by drop in hemoglobin and symptomatic hypotension, requiring 1 unit pRBCs. GI consulted and did not recommend scoping. BB and spiro were stopped due to low BPs. Had some AKI assoicated with low BP, but resolved by discharge. Required lovenox bridge at DC with subtherapeutic INR. Discharged with [facility]. DC weight: 216 lbs.   Admitted 9/11 - 12/14/17 for dental extractions in setting of work up for MVR. Hospital course complicated by run of VT in the setting of hypokalemia. Started on amiodarone with h/o of arrhythmia. Bridged with lovenox.   Admitted 9/22 - 12/28/17 with symptomatic anemia from bleeding gums. Bleeding stopped with holding heparin and coumadin. Received multiple units of blood.   He was admitted again in 10/19 with left thigh hematoma and went from there to inpatient rehab.   He has been evaluated in [place] for a transcatheter mitral valve.  RHC/LHC was done in 1/20 as part of his pre-op evaluation.  Filling pressures and cardiac output were well-compensated, he had patent SVG-PDA.  Echo in 1/20 showed EF 40-45%, moderate RV dilation with normal systolic function, severe MR with likely rheumatic mitral valve, normal St Jude mechanical aortic valve. Unfortunately, it was  determined that he would not qualify for any of the percutaneous mitral valve studies.    Last month he was seen in the HF clinic and was instructed to increase metolazone to 3 times a week.  Had labs check on 2/3 and creatinine was up to 2.9 so metolazone was cut back to once a week.    Today he returns for HF follow up. Mild SOB with exertion. Denies PND/Orthopnea. Using CPAP. Has chronic leg edema. No bleeding issues. Appetite ok. No fever or chills. Weight at home has been ok. Taking all medications.   Labs (10/19): hgb 9.2 Labs (11/19): K 3.2, creatinine 1.31 Labs (1/20): K 4.5, creatinine 1.38 Labs (2/20): LDL 64 Labs (6/20): K 4.1, creatinine 1.8, hgb 12.9 Labs (12/24/18): K 3.6 Creatinine 2.2 Labs (2/3/221): Creatinine 2.9   ROS:  All systems reviewed and negative except as per HPI.    PMH: 1. Carotid stenosis: S/p left CEA in 1994.  2. Atrial fibrillation: Chronic.  He is on warfarin with mechanical aortic valve.  3. Type II diabetes.  4. COPD: PFTs (11/19) with moderate obstructive airways disease and severely decreased DLCO.  5. OSA: Uses CPAP.  6. 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  7. ITP 8. VT: On amiodarone.  9. CKD: Stage 3.  10. 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  11. Chronic systolic CHF: TEE (4/16) with EF 55-60%, D-shaped septum, mildly dilated RV with mildly decreased systolic function, mechanical aortic valve, rheumatic-appearing mitral valve with severe MR, minimal stenosis.  -  RHC (1/20): mean RA 6, PA 54/20, mean PCWP 15, CI 3.42, PVR 2.1 WU, PAPI 5.2 - Echo (1/20): EF 40-45%, moderate RV dilation with normal systolic function, severe biatrial enlargement, severe rheumatic MR, St Jude mechanical aortic valve functioning normally.  12. ABIs (12/18): Normal.  13. CAD: S/p CABG 1994.  - LHC (1/20): 50% D1, 60% ostial LCx, totally occluded RCA  with patent SVG-PDA.     Current Outpatient Medications  Medication Sig Dispense Refill  . acetaminophen (TYLENOL) 500 MG tablet Take 1,000 mg by mouth every 6 (six) hours as needed for moderate pain or headache.    . allopurinol (ZYLOPRIM) 100 MG tablet Take 1 tablet daily 30 tablet 6  . amiodarone (PACERONE) 200 MG tablet Take 1 tablet (200 mg total) by mouth daily. 90 tablet 3  . ezetimibe-simvastatin (VYTORIN) 10-20 MG tablet TAKE 1 TABLET BY MOUTH EVERYDAY AT BEDTIME 90 tablet 1  . finasteride (PROSCAR) 5 MG tablet Take 5 mg by mouth daily.    Marland Kitchen levothyroxine (SYNTHROID, LEVOTHROID) 50 MCG tablet Take 1 tablet (50 mcg total) by mouth daily before breakfast. 90 tablet 3  . metFORMIN (GLUCOPHAGE) 500 MG tablet Take 750 mg by mouth daily with breakfast.     . metolazone (ZAROXOLYN) 2.5 MG tablet Take 1 tablet (2.5 mg total) by mouth once a week. Monday 15 tablet 3  . polyethylene glycol powder (GLYCOLAX/MIRALAX) powder Take 17 g by mouth daily as needed for moderate constipation.    . potassium chloride SA (KLOR-CON M20) 20 MEQ tablet TAKE 3 TABS EVERY MORNING, 2 TABS AT NOON AND 2 TABS EVERY EVENING 630 tablet 1  . spironolactone (ALDACTONE) 25 MG tablet TAKE 1 TABLET BY MOUTH EVERY DAY 90 tablet 1  . tamsulosin (FLOMAX) 0.4 MG CAPS capsule Take 0.4 mg by mouth.    . torsemide (DEMADEX) 20 MG tablet Take 5 tablets (100 mg total) by mouth 2 (two) times daily. 300 tablet 11  . triamcinolone ointment (KENALOG) 0.1 % Apply 1 application topically 2 (two) times daily. 80 g 1  . warfarin (COUMADIN) 5 MG tablet TAKE 1 & 1/2 TABLETS DAILY EXCEPT 1 TABLET ON TUES AND THURS OR AS DIRECTED BY COUMADIN CLINIC 120 tablet 0  . carvedilol (COREG) 3.125 MG tablet Take 1 tablet (3.125 mg total) by mouth 2 (two) times daily. 60 tablet 6   No current facility-administered medications for this encounter.   Allergies  Allergen Reactions  . Rifampin Rash and Other (See Comments)    May have been caused by  Vancomycin or Rifampin (??)  . Vancomycin Rash and Other (See Comments)    May have been caused by Vancomycin or Rifampin (??)   Social History   Socioeconomic History  . Marital status: Married    Spouse name: Not on file  . Number of children: 3  . Years of education: Not on file  . Highest education level: Not on file  Occupational History  . Not on file  Tobacco Use  . Smoking status: Former Smoker    Packs/day: 1.00    Years: 30.00    Pack years: 30.00    Types: Cigarettes    Quit date: 04/03/1992    Years since quitting: 27.1  . Smokeless tobacco: Never Used  Substance and Sexual Activity  . Alcohol use: Yes    Comment: 4-6 ounces of wine daily  . Drug use: No    Comment: Half a cup a day.  Marland Kitchen Sexual activity: Not on file  Other Topics Concern  . Not on file  Social History Narrative   Patient is married and lives with his wife.   Youngest daughter lives in [place] also.   Patient has 2 other children.   Social Determinants of Health   Financial Resource Strain:   . Difficulty of Paying Living Expenses: Not on file  Food Insecurity:   . Worried About Charity fundraiser in the Last Year: Not on file  . Ran Out of Food in the Last Year: Not on file  Transportation Needs:   . Lack of Transportation (Medical): Not on file  . Lack of Transportation (Non-Medical): Not on file  Physical Activity:   . Days of Exercise per Week: Not on file  . Minutes of Exercise per Session: Not on file  Stress:   . Feeling of Stress : Not on file  Social Connections:   . Frequency of Communication with Friends and Family: Not on file  . Frequency of Social Gatherings with Friends and Family: Not on file  . Attends Religious Services: Not on file  . Active Member of Clubs or Organizations: Not on file  . Attends Archivist Meetings: Not on file  . Marital Status: Not on file  Intimate Partner Violence:   . Fear of Current or Ex-Partner: Not on file  . Emotionally  Abused: Not on file  . Physically Abused: Not on file  . Sexually Abused: Not on file    Family History  Problem Relation Age of Onset  . Cancer Father        lung   . Hypertension Mother    Vitals:   05/13/19 0929  BP: 118/62  Pulse: (!) 59  SpO2: 95%  Weight: 107 kg (235 lb 12.8 oz)     Wt Readings from Last 3 Encounters:  05/13/19 107 kg (235 lb 12.8 oz)  04/21/19 106.1 kg (233 lb 12.8 oz)  04/08/19 100.2 kg (221 lb)   PHYSICAL EXAM: General:  No resp difficulty HEENT: normal Neck: supple. JVP 6-7. Carotids 2+ bilat; no bruits. No lymphadenopathy or thryomegaly appreciated. Cor: PMI nondisplaced. Irregular rate & rhythm. No rubs, gallops. 2/6 MR. Lungs: clear Abdomen: soft, nontender, nondistended. No hepatosplenomegaly. No bruits or masses. Good bowel sounds. Extremities: no cyanosis, clubbing, rash,  LLE trace-1+edema . RLE trace  Neuro: alert & orientedx3, cranial nerves grossly intact. moves all 4 extremities w/o difficulty. Affect pleasant   ASSESSMENT & PLAN: 1. Chronic systolic CHF with prominent RV failure:  Echo in 1/20 with EF 40-45%, moderate RV dilation/normal function, severe rheumatic mitral regurgitation, normal mechanical aortic valve.  Severe MR likely plays a significant role in CHF and RV failure.  Reds Clip 29%.  NYHA II-III. Volume status low suspect he is dry with Reds Clip 29%. I have asked him to hold torsemide, spiro and metolazone.  - Check BMET now and adjust meds.  -  Continue Coreg 3.125 mg bid.  -2. Mechanical aortic valve: Appeared to function well on last echo. Crisp valve sounds on exam. - Continue warfarin with INR goal 2.5-3.5 (also with atrial fibrillation).  - He is off aspirin with spontaneous thigh hematoma.   - No bleeding issues.  3. Atrial fibrillation: Rate controlled.  - continue coreg + Coumadin   4. CAD: s/p CABG.  No chest pain.  Cath in 1/20 with patent SVG-PDA, no interventional target.  - No ASA given stable CAD with  warfarin use.   - Continue Vytorin 5. COPD:  He is no longer using oxygen during the day.  Moderate COPD on 11/19 PFTs.   6. OSA: poor compliance w/ CPAP per above. Followed by Dr. Radford Pax 7.AKI on CKD Stage III.  -Recent creatinine 2.9 . Hold diuretics.  -Check BMET  - Refer to nephrology.  8. Thrombocytopenia: Chronic, has been mild/stable.   9. Severe MR: TEE 7/19 with severe MR with possible rheumatic MV (do not think MS is significant, mildly elevated mean gradient from high flow with severe MR).  Structural Heart team evaluated, not a Mitraclip candidate. Seen by Dr. [name], would be high risk for open surgery.  He was evaluated at [facility] in [place] for percutaneous mitral valve trials.  Unfortunately, he does not qualify for any of the available trials.  We will have to continue medical management.   10. VT: He is on amiodarone to suppress VT.  - denies syncope/ near syncope  -Recent TSH 13.5   Check TFTs and HFTs today. Reminded he needs annual eye exams. Scheduled this week. 11. DMII] With elevated creatinine. Stop metformin. Will message his primary care.   Follow up in 3 weeks with [name]. Check Reds Clip at that time.   Dareon Nunziato NP-C  05/13/2019

## 2019-05-13 NOTE — Patient Instructions (Addendum)
Lab work done today. We will notify you of any abnormal lab work. No news is good news!  HOLD: torsemide, potassium, and spironolactone until Amy NP-C calls you with results.  Please follow up with the [facility] in 3 weeks.  At the [facility], you and your health needs are our priority. As part of our continuing mission to provide you with exceptional heart care, we have created designated Provider Care Teams. These Care Teams include your primary Cardiologist (physician) and Advanced Practice Providers (APPs- Physician Assistants and Nurse Practitioners) who all work together to provide you with the care you need, when you need it.   You may see any of the following providers on your designated Care Team at your next follow up: Marland Kitchen Dr Glori Bickers . Dr Loralie Champagne . Darrick Grinder, NP . Lyda Jester, PA . Audry Riles, PharmD   Please be sure to bring in all your medications bottles to every appointment.

## 2019-05-13 NOTE — Telephone Encounter (Signed)
-----   Message from Conrad , NP sent at 05/13/2019 12:49 PM EST ----- Creatinine/BUN down a little. For now. Stop metolazone. Stop Spironolactone. Stop metformin. Hold Torsemide and potassium for 2 days then he start torsemide 100 mg twice a day and 40 meq potassium twice a day. Check BMET next week. Refer to Nephrology. Please call and let him know.

## 2019-05-13 NOTE — Progress Notes (Signed)
   Vest 29% Station D Sitting Ruler 34

## 2019-05-19 DIAGNOSIS — H2513 Age-related nuclear cataract, bilateral: Secondary | ICD-10-CM | POA: Diagnosis not present

## 2019-05-19 DIAGNOSIS — H5213 Myopia, bilateral: Secondary | ICD-10-CM | POA: Diagnosis not present

## 2019-05-19 LAB — HM DIABETES EYE EXAM

## 2019-05-20 ENCOUNTER — Ambulatory Visit (INDEPENDENT_AMBULATORY_CARE_PROVIDER_SITE_OTHER): Payer: Medicare Other | Admitting: *Deleted

## 2019-05-20 ENCOUNTER — Other Ambulatory Visit: Payer: Self-pay

## 2019-05-20 DIAGNOSIS — I4891 Unspecified atrial fibrillation: Secondary | ICD-10-CM | POA: Diagnosis not present

## 2019-05-20 DIAGNOSIS — Z952 Presence of prosthetic heart valve: Secondary | ICD-10-CM | POA: Diagnosis not present

## 2019-05-20 DIAGNOSIS — Z5181 Encounter for therapeutic drug level monitoring: Secondary | ICD-10-CM | POA: Diagnosis not present

## 2019-05-20 LAB — POCT INR: INR: 3.4 — AB (ref 2.0–3.0)

## 2019-05-20 NOTE — Patient Instructions (Signed)
Description   Continue taking 1 tablet daily except 1.5 tablets on Mondays, Wednesdays, and Fridays. Recheck INR in 3 weeks. Call coumadin clinic for any questions [phone].

## 2019-05-21 ENCOUNTER — Ambulatory Visit (HOSPITAL_COMMUNITY)
Admission: RE | Admit: 2019-05-21 | Discharge: 2019-05-21 | Disposition: A | Discharge status: Home / Self Care | Payer: Medicare Other | Source: Ambulatory Visit | Attending: Internal Medicine | Admitting: Internal Medicine

## 2019-05-21 DIAGNOSIS — I5032 Chronic diastolic (congestive) heart failure: Secondary | ICD-10-CM | POA: Insufficient documentation

## 2019-05-21 LAB — BASIC METABOLIC PANEL
Anion gap: 13 (ref 5–15)
BUN: 66 mg/dL — ABNORMAL HIGH (ref 8–23)
CO2: 25 mmol/L (ref 22–32)
Calcium: 9.4 mg/dL (ref 8.9–10.3)
Chloride: 101 mmol/L (ref 98–111)
Creatinine, Ser: 2.47 mg/dL — ABNORMAL HIGH (ref 0.61–1.24)
GFR calc Af Amer: 27 mL/min — ABNORMAL LOW (ref >60)
GFR calc non Af Amer: 23 mL/min — ABNORMAL LOW (ref >60)
Glucose, Bld: 135 mg/dL — ABNORMAL HIGH (ref 70–99)
Potassium: 4 mmol/L (ref 3.5–5.1)
Sodium: 139 mmol/L (ref 135–145)

## 2019-05-22 ENCOUNTER — Encounter: Payer: Self-pay | Admitting: Internal Medicine

## 2019-05-27 DIAGNOSIS — D631 Anemia in chronic kidney disease: Secondary | ICD-10-CM | POA: Diagnosis not present

## 2019-05-27 DIAGNOSIS — I1310 Hypertensive heart and chronic kidney disease without heart failure, with stage 1 through stage 4 chronic kidney disease, or unspecified chronic kidney disease: Secondary | ICD-10-CM | POA: Diagnosis not present

## 2019-05-27 DIAGNOSIS — I5042 Chronic combined systolic (congestive) and diastolic (congestive) heart failure: Secondary | ICD-10-CM | POA: Diagnosis not present

## 2019-05-27 DIAGNOSIS — Z7901 Long term (current) use of anticoagulants: Secondary | ICD-10-CM | POA: Diagnosis not present

## 2019-05-27 DIAGNOSIS — N184 Chronic kidney disease, stage 4 (severe): Secondary | ICD-10-CM | POA: Diagnosis not present

## 2019-05-28 ENCOUNTER — Telehealth (HOSPITAL_COMMUNITY): Payer: Self-pay

## 2019-05-28 NOTE — Telephone Encounter (Signed)
Referral sent to [facility]. Fax confirmation received.

## 2019-06-02 ENCOUNTER — Encounter (HOSPITAL_COMMUNITY): Payer: Self-pay | Admitting: Cardiology

## 2019-06-02 ENCOUNTER — Ambulatory Visit (HOSPITAL_COMMUNITY)
Admission: RE | Admit: 2019-06-02 | Discharge: 2019-06-02 | Disposition: A | Discharge status: Home / Self Care | Payer: Medicare Other | Source: Ambulatory Visit | Attending: Cardiology | Admitting: Cardiology

## 2019-06-02 ENCOUNTER — Other Ambulatory Visit: Payer: Self-pay

## 2019-06-02 VITALS — BP 122/50 | HR 70 | Wt 244.4 lb

## 2019-06-02 DIAGNOSIS — Z951 Presence of aortocoronary bypass graft: Secondary | ICD-10-CM | POA: Diagnosis not present

## 2019-06-02 DIAGNOSIS — D696 Thrombocytopenia, unspecified: Secondary | ICD-10-CM | POA: Insufficient documentation

## 2019-06-02 DIAGNOSIS — I482 Chronic atrial fibrillation, unspecified: Secondary | ICD-10-CM | POA: Insufficient documentation

## 2019-06-02 DIAGNOSIS — Z883 Allergy status to other anti-infective agents status: Secondary | ICD-10-CM | POA: Insufficient documentation

## 2019-06-02 DIAGNOSIS — E039 Hypothyroidism, unspecified: Secondary | ICD-10-CM | POA: Diagnosis not present

## 2019-06-02 DIAGNOSIS — I2510 Atherosclerotic heart disease of native coronary artery without angina pectoris: Secondary | ICD-10-CM | POA: Diagnosis not present

## 2019-06-02 DIAGNOSIS — Z87891 Personal history of nicotine dependence: Secondary | ICD-10-CM | POA: Diagnosis not present

## 2019-06-02 DIAGNOSIS — J449 Chronic obstructive pulmonary disease, unspecified: Secondary | ICD-10-CM | POA: Diagnosis not present

## 2019-06-02 DIAGNOSIS — I5042 Chronic combined systolic (congestive) and diastolic (congestive) heart failure: Secondary | ICD-10-CM | POA: Insufficient documentation

## 2019-06-02 DIAGNOSIS — E1122 Type 2 diabetes mellitus with diabetic chronic kidney disease: Secondary | ICD-10-CM | POA: Diagnosis not present

## 2019-06-02 DIAGNOSIS — G4733 Obstructive sleep apnea (adult) (pediatric): Secondary | ICD-10-CM | POA: Diagnosis not present

## 2019-06-02 DIAGNOSIS — I4891 Unspecified atrial fibrillation: Secondary | ICD-10-CM

## 2019-06-02 DIAGNOSIS — Z881 Allergy status to other antibiotic agents status: Secondary | ICD-10-CM | POA: Diagnosis not present

## 2019-06-02 DIAGNOSIS — Z79890 Hormone replacement therapy (postmenopausal): Secondary | ICD-10-CM | POA: Insufficient documentation

## 2019-06-02 DIAGNOSIS — I5032 Chronic diastolic (congestive) heart failure: Secondary | ICD-10-CM | POA: Diagnosis not present

## 2019-06-02 DIAGNOSIS — I340 Nonrheumatic mitral (valve) insufficiency: Secondary | ICD-10-CM | POA: Diagnosis not present

## 2019-06-02 DIAGNOSIS — Z801 Family history of malignant neoplasm of trachea, bronchus and lung: Secondary | ICD-10-CM | POA: Diagnosis not present

## 2019-06-02 DIAGNOSIS — N183 Chronic kidney disease, stage 3 unspecified: Secondary | ICD-10-CM | POA: Insufficient documentation

## 2019-06-02 DIAGNOSIS — Z7901 Long term (current) use of anticoagulants: Secondary | ICD-10-CM | POA: Diagnosis not present

## 2019-06-02 DIAGNOSIS — Z952 Presence of prosthetic heart valve: Secondary | ICD-10-CM | POA: Diagnosis not present

## 2019-06-02 DIAGNOSIS — Z8249 Family history of ischemic heart disease and other diseases of the circulatory system: Secondary | ICD-10-CM | POA: Insufficient documentation

## 2019-06-02 DIAGNOSIS — Z79899 Other long term (current) drug therapy: Secondary | ICD-10-CM | POA: Insufficient documentation

## 2019-06-02 LAB — T4, FREE: Free T4: 0.8 ng/dL (ref 0.61–1.12)

## 2019-06-02 LAB — COMPREHENSIVE METABOLIC PANEL
ALT: 13 U/L (ref 0–44)
AST: 19 U/L (ref 15–41)
Albumin: 3.7 g/dL (ref 3.5–5.0)
Alkaline Phosphatase: 83 U/L (ref 38–126)
Anion gap: 15 (ref 5–15)
BUN: 77 mg/dL — ABNORMAL HIGH (ref 8–23)
CO2: 27 mmol/L (ref 22–32)
Calcium: 9 mg/dL (ref 8.9–10.3)
Chloride: 97 mmol/L — ABNORMAL LOW (ref 98–111)
Creatinine, Ser: 2.62 mg/dL — ABNORMAL HIGH (ref 0.61–1.24)
GFR calc Af Amer: 25 mL/min — ABNORMAL LOW (ref >60)
GFR calc non Af Amer: 22 mL/min — ABNORMAL LOW (ref >60)
Glucose, Bld: 110 mg/dL — ABNORMAL HIGH (ref 70–99)
Potassium: 2.9 mmol/L — ABNORMAL LOW (ref 3.5–5.1)
Sodium: 139 mmol/L (ref 135–145)
Total Bilirubin: 1.1 mg/dL (ref 0.3–1.2)
Total Protein: 7 g/dL (ref 6.5–8.1)

## 2019-06-02 LAB — TSH: TSH: 15.563 u[IU]/mL — ABNORMAL HIGH (ref 0.350–4.500)

## 2019-06-02 MED ORDER — METOLAZONE 2.5 MG PO TABS: 2.5000 mg | ORAL_TABLET | ORAL | 5 refills | Status: DC

## 2019-06-02 MED ORDER — TORSEMIDE 100 MG PO TABS: 100.0000 mg | ORAL_TABLET | Freq: Two times a day (BID) | ORAL | 3 refills | Status: DC

## 2019-06-02 NOTE — Progress Notes (Signed)
Advanced Heart Failure Clinic Note   PCP: [name], [name], MD PCP-Cardiologist: [name], MD  HF: [name]  HPI: [name] is a 84 y.o. male with h/o mechanical AVR 1994, OSA on CPAP, Chronic afib, chronic combined CHF, CKD III, chronic coumadin therapy, CAD s/p CAGB 1994, and COPD.   Admitted 7/18-11/05/17 from [facility] office with A/C diastolic HF. Diuresed 83 lbs with lasix drip, metolazone, and diamox. Transitioned to torsemide 80 mg BID. Determined not to be a candidate for MitraClip. Dr [name] also consulted and recommended aggressive medical therapy versus  percutaneous MVR trial as he felt very high risk for conventional surgery. Course complicated by drop in hemoglobin and symptomatic hypotension, requiring 1 unit pRBCs. GI consulted and did not recommend scoping. BB and spiro were stopped due to low BPs. Had some AKI assoicated with low BP, but resolved by discharge. Required lovenox bridge at DC with subtherapeutic INR. Discharged with [facility]. DC weight: 216 lbs.   Admitted 9/11 - 12/14/17 for dental extractions in setting of work up for MVR. Hospital course complicated by run of VT in the setting of hypokalemia. Started on amiodarone with h/o of arrhythmia. Bridged with lovenox.   Admitted 9/22 - 12/28/17 with symptomatic anemia from bleeding gums. Bleeding stopped with holding heparin and coumadin. Received multiple units of blood.   He was admitted again in 10/19 with left thigh hematoma and went from there to inpatient rehab.   He has been evaluated in [place] for a transcatheter mitral valve.  RHC/LHC was done in 1/20 as part of his pre-op evaluation.  Filling pressures and cardiac output were well-compensated, he had patent SVG-PDA.  Echo in 1/20 showed EF 40-45%, moderate RV dilation with normal systolic function, severe MR with likely rheumatic mitral valve, normal St Jude mechanical aortic valve. Unfortunately, it was determined that he would not qualify for any of  the percutaneous mitral valve studies.    His diuretic doses have been up and down recently based on volume retention and creatinine.   He returns for followup of diastolic CHF, mechanical aortic valve, chronic atrial fibrillation, and CAD.  Today, weight is up 9 lbs.  He was taken off metolazone when his creatinine went up to 2.9.  Creatinine down to 2.47 most recently but he has developed increased peripheral edema and abdominal distention.  He is short of breath after walking about 200 yards or walking up stairs.  No chest pain.  No lightheadedness.  No BRBPR/melena.   ECG (personally reviewed): atrial fibrillation, nonspecific T wave flattening    Labs (10/19): hgb 9.2 Labs (11/19): K 3.2, creatinine 1.31 Labs (1/20): K 4.5, creatinine 1.38 Labs (2/20): LDL 64 Labs (6/20): K 4.1, creatinine 1.8, hgb 12.9 Labs (2/21): K 4, creatinine 2.9 => 2.47  ROS:  All systems reviewed and negative except as per HPI.    PMH: 1. Carotid stenosis: S/p left CEA in 1994.  2. Atrial fibrillation: Chronic.  He is on warfarin with mechanical aortic valve.  3. Type II diabetes.  4. COPD: PFTs (11/19) with moderate obstructive airways disease and severely decreased DLCO.  5. OSA: Uses CPAP.  6. 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  7. ITP 8. VT: On amiodarone.  9. CKD: Stage 3.  10. 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  11. Chronic systolic CHF: TEE (1/60) with EF 55-60%, D-shaped septum, mildly dilated RV with mildly  decreased systolic function, mechanical aortic valve, rheumatic-appearing mitral valve with severe MR, minimal stenosis.  - RHC (1/20): mean RA 6, PA 54/20, mean PCWP 15, CI 3.42, PVR 2.1 WU, PAPI 5.2 - Echo (1/20): EF 40-45%, moderate RV dilation with normal systolic function, severe biatrial enlargement, severe rheumatic MR, St Jude mechanical aortic valve functioning  normally.  12. ABIs (12/18): Normal.  13. CAD: S/p CABG 1994.  - LHC (1/20): 50% D1, 60% ostial LCx, totally occluded RCA with patent SVG-PDA.     Current Outpatient Medications  Medication Sig Dispense Refill  . acetaminophen (TYLENOL) 500 MG tablet Take 1,000 mg by mouth every 6 (six) hours as needed for moderate pain or headache.    . allopurinol (ZYLOPRIM) 100 MG tablet Take 1 tablet daily 30 tablet 6  . amiodarone (PACERONE) 200 MG tablet Take 1 tablet (200 mg total) by mouth daily. 90 tablet 3  . carvedilol (COREG) 3.125 MG tablet Take 1 tablet (3.125 mg total) by mouth 2 (two) times daily. 60 tablet 6  . DUREZOL 0.05 % EMUL Place 1 drop into the right eye 4 (four) times daily.    Marland Kitchen ezetimibe-simvastatin (VYTORIN) 10-20 MG tablet TAKE 1 TABLET BY MOUTH EVERYDAY AT BEDTIME 90 tablet 1  . finasteride (PROSCAR) 5 MG tablet Take 5 mg by mouth daily.    Marland Kitchen levothyroxine (SYNTHROID, LEVOTHROID) 50 MCG tablet Take 1 tablet (50 mcg total) by mouth daily before breakfast. 90 tablet 3  . metolazone (ZAROXOLYN) 2.5 MG tablet Take 1 tablet (2.5 mg total) by mouth 2 (two) times a week. Mondays and Thursdays 10 tablet 5  . moxifloxacin (VIGAMOX) 0.5 % ophthalmic solution PLACE 1 DROP INTO RIGHT EYE 4 TIMES A DAY STARTING 2 DAYS BEFORE SURGERY AND 2DROPS THE MORNING OF    . polyethylene glycol powder (GLYCOLAX/MIRALAX) powder Take 17 g by mouth daily as needed for moderate constipation.    . potassium chloride SA (KLOR-CON M20) 20 MEQ tablet Take 2 tablets (40 mEq total) by mouth 2 (two) times daily. TAKE 3 TABS EVERY MORNING, 2 TABS AT NOON AND 2 TABS EVERY EVENING (Patient taking differently: Take 40 mEq by mouth 2 (two) times daily. ) 360 tablet 2  . tamsulosin (FLOMAX) 0.4 MG CAPS capsule Take 0.4 mg by mouth daily after supper.     . torsemide (DEMADEX) 100 MG tablet Take 1 tablet (100 mg total) by mouth 2 (two) times daily. 180 tablet 3  . triamcinolone ointment (KENALOG) 0.1 % Apply 1 application  topically 2 (two) times daily. (Patient taking differently: Apply 1 application topically daily as needed. ) 80 g 1  . warfarin (COUMADIN) 5 MG tablet TAKE 1 & 1/2 TABLETS DAILY EXCEPT 1 TABLET ON TUES AND THURS OR AS DIRECTED BY COUMADIN CLINIC 120 tablet 0   No current facility-administered medications for this encounter.   Allergies  Allergen Reactions  . Rifampin Rash and Other (See Comments)    May have been caused by Vancomycin or Rifampin (??)  . Vancomycin Rash and Other (See Comments)    May have been caused by Vancomycin or Rifampin (??)   Social History   Socioeconomic History  . Marital status: Married    Spouse name: Not on file  . Number of children: 3  . Years of education: Not on file  . Highest education level: Not on file  Occupational History  . Not on file  Tobacco Use  . Smoking status: Former Smoker    Packs/day: 1.00  Years: 30.00    Pack years: 30.00    Types: Cigarettes    Quit date: 04/03/1992    Years since quitting: 27.1  . Smokeless tobacco: Never Used  Substance and Sexual Activity  . Alcohol use: Yes    Comment: 4-6 ounces of wine daily  . Drug use: No    Comment: Half a cup a day.  Marland Kitchen Sexual activity: Not on file  Other Topics Concern  . Not on file  Social History Narrative   Patient is married and lives with his wife.   Youngest daughter lives in [place] also.   Patient has 2 other children.   Social Determinants of Health   Financial Resource Strain:   . Difficulty of Paying Living Expenses: Not on file  Food Insecurity:   . Worried About Charity fundraiser in the Last Year: Not on file  . Ran Out of Food in the Last Year: Not on file  Transportation Needs:   . Lack of Transportation (Medical): Not on file  . Lack of Transportation (Non-Medical): Not on file  Physical Activity:   . Days of Exercise per Week: Not on file  . Minutes of Exercise per Session: Not on file  Stress:   . Feeling of Stress : Not on file  Social  Connections:   . Frequency of Communication with Friends and Family: Not on file  . Frequency of Social Gatherings with Friends and Family: Not on file  . Attends Religious Services: Not on file  . Active Member of Clubs or Organizations: Not on file  . Attends Archivist Meetings: Not on file  . Marital Status: Not on file  Intimate Partner Violence:   . Fear of Current or Ex-Partner: Not on file  . Emotionally Abused: Not on file  . Physically Abused: Not on file  . Sexually Abused: Not on file    Family History  Problem Relation Age of Onset  . Cancer Father        lung   . Hypertension Mother    Vitals:   06/02/19 1035  BP: (!) 122/50  Pulse: 70  SpO2: 96%  Weight: 110.9 kg (244 lb 6.4 oz)     Wt Readings from Last 3 Encounters:  06/02/19 110.9 kg (244 lb 6.4 oz)  05/13/19 107 kg (235 lb 12.8 oz)  04/21/19 106.1 kg (233 lb 12.8 oz)   PHYSICAL EXAM: General: NAD Neck: JVP 10 cm with HJR, no thyromegaly or thyroid nodule.  Lungs: Clear to auscultation bilaterally with normal respiratory effort. CV: Nondisplaced PMI.  Heart irregular S1/S2, mechanical S2, no S3/S4, 2/6 HSM apex.  2+ edema to knee on right, 1+ edema to knee on right.  No carotid bruit.  Normal pedal pulses.  Abdomen: Soft, nontender, no hepatosplenomegaly, no distention.  Skin: Intact without lesions or rashes.  Neurologic: Alert and oriented x 3.  Psych: Normal affect. Extremities: No clubbing or cyanosis.  HEENT: Normal.   ASSESSMENT & PLAN: 1. Chronic systolic CHF with prominent RV failure:  Echo in 1/20 with EF 40-45%, moderate RV dilation/normal function, severe rheumatic mitral regurgitation, normal mechanical aortic valve.  Severe MR likely plays a significant role in CHF and RV failure. NYHA class III.  Weight is up and he is volume overloaded, metolazone stopped recently due to rise in creatinine. I think that he is going to need regular metolazone and we may have to accept some rise in  creatinine.  - Continue torsemide 100 mg  bid.  - He is off spironolactone with elevated creatinine.  - Start back on metolazone 2.5 mg twice a week on Mondays and Thursdays, take extra 40 KCl on metolazone days.  BMET today and in 10 days.  - Continue Coreg 3.125 mg bid.  - He is due for a repeat echo, I will arrange.  2. Mechanical aortic valve: Appeared to function well on last echo.  - Continue warfarin with INR goal 2.5-3.5 (also with atrial fibrillation).  - He is off aspirin with spontaneous thigh hematoma.   3. Atrial fibrillation: Chronic, rate is controlled.  4. CAD: s/p CABG.  No chest pain.  Cath in 1/20 with patent SVG-PDA, no interventional target.  - No ASA given stable CAD with warfarin use.   - Continue Vytorin, needs lipids at next appt.  5. COPD: He is no longer using oxygen during the day.  Moderate COPD on 11/19 PFTs.   6. OSA: Continue nightly BiPAP.  7.CKD Stage 3: BMET today.  8. Thrombocytopenia: Chronic, has been mild/stable.   9. Severe MR: TEE 7/19 with severe MR with possible rheumatic MV (do not think MS is significant, mildly elevated mean gradient from high flow with severe MR).  Structural Heart team evaluated, not a Mitraclip candidate. Seen by Dr. [name], would be high risk for open surgery.  He was evaluated at [facility] in [place] for percutaneous mitral valve trials.  Unfortunately, he does not qualify for any of the available trials.  We will have to continue medical management.  10. VT: He is on amiodarone to suppress VT.  - Check LFTs.  Hypothyroidism followed by PCP.  Will need regular eye exam on amiodaeone.   Followup with me in 1 month.   Loralie Champagne 06/02/2019

## 2019-06-02 NOTE — Patient Instructions (Addendum)
BEGIN taking Metolazone 2.5mg  (1 tab) TWICE A WEEK (Mondays and Thursdays).  Take a dose today.   Take an EXTRA 40 meq of Potassium (2 tabs) on Metolazone days   Labs today and repeat in 10 days We will only contact you if something comes back abnormal or we need to make some changes. Otherwise no news is good news!   Your physician recommends that you schedule a follow-up appointment in: 1 month with [name].  Please call office at [phone] option 2 if you have any questions or concerns.   At the [facility], you and your health needs are our priority. As part of our continuing mission to provide you with exceptional heart care, we have created designated Provider Care Teams. These Care Teams include your primary Cardiologist (physician) and Advanced Practice Providers (APPs- Physician Assistants and Nurse Practitioners) who all work together to provide you with the care you need, when you need it.   You may see any of the following providers on your designated Care Team at your next follow up: Marland Kitchen Dr Glori Bickers . Dr Loralie Champagne . Darrick Grinder, NP . Lyda Jester, PA . Audry Riles, PharmD   Please be sure to bring in all your medications bottles to every appointment.

## 2019-06-03 ENCOUNTER — Telehealth (HOSPITAL_COMMUNITY): Payer: Self-pay | Admitting: Vascular Surgery

## 2019-06-03 ENCOUNTER — Telehealth (HOSPITAL_COMMUNITY): Payer: Self-pay

## 2019-06-03 ENCOUNTER — Ambulatory Visit: Payer: Medicare Other | Attending: Internal Medicine

## 2019-06-03 DIAGNOSIS — Z23 Encounter for immunization: Secondary | ICD-10-CM | POA: Insufficient documentation

## 2019-06-03 LAB — T3, FREE: T3, Free: 2 pg/mL (ref 2.0–4.4)

## 2019-06-03 MED ORDER — POTASSIUM CHLORIDE CRYS ER 20 MEQ PO TBCR: 60.0000 meq | EXTENDED_RELEASE_TABLET | Freq: Two times a day (BID) | ORAL | 2 refills | Status: DC

## 2019-06-03 NOTE — Telephone Encounter (Signed)
Pt aware. Pt currently takes 53meq BID. Advised to increase to 60 BID and continue to add 2 extra tabs on days he takes metolazone

## 2019-06-03 NOTE — Progress Notes (Signed)
   Covid-19 Vaccination Clinic  Name:  Kevonte Vanecek    MRN: [id number] DOB: [birth year]  06/03/2019  Mr. Parmelee was observed post Covid-19 immunization for 15 minutes without incident. He was provided with Vaccine Information Sheet and instruction to access the V-Safe system.   Mr. Schellenberg was instructed to call 911 with any severe reactions post vaccine: Marland Kitchen Difficulty breathing  . Swelling of face and throat  . A fast heartbeat  . A bad rash all over body  . Dizziness and weakness   Immunizations Administered    Name Date Dose VIS Date Route   Pfizer COVID-19 Vaccine 06/03/2019  8:21 AM 0.3 mL 03/14/2019 Intramuscular   Manufacturer: [employer]   Lot: [id number]   [place]: [address]-0

## 2019-06-03 NOTE — Telephone Encounter (Signed)
Left pt message to make echo appt

## 2019-06-03 NOTE — Telephone Encounter (Signed)
-----   Message from Larey Dresser, MD sent at 06/03/2019  1:43 PM EST ----- Increase total daily KCl by 40 mEq.

## 2019-06-05 DIAGNOSIS — H2511 Age-related nuclear cataract, right eye: Secondary | ICD-10-CM | POA: Diagnosis not present

## 2019-06-05 DIAGNOSIS — H25811 Combined forms of age-related cataract, right eye: Secondary | ICD-10-CM | POA: Diagnosis not present

## 2019-06-10 ENCOUNTER — Ambulatory Visit (INDEPENDENT_AMBULATORY_CARE_PROVIDER_SITE_OTHER): Payer: Medicare Other | Admitting: Pharmacist

## 2019-06-10 ENCOUNTER — Other Ambulatory Visit: Payer: Self-pay

## 2019-06-10 DIAGNOSIS — I4891 Unspecified atrial fibrillation: Secondary | ICD-10-CM

## 2019-06-10 DIAGNOSIS — Z952 Presence of prosthetic heart valve: Secondary | ICD-10-CM

## 2019-06-10 LAB — POCT INR: INR: 4.8 — AB (ref 2.0–3.0)

## 2019-06-10 NOTE — Patient Instructions (Signed)
Hold coumadin today and tomorrow then continue taking 1 tablet daily except 1.5 tablets on Mondays, Wednesdays, and Fridays. Recheck INR in 3 weeks. Call coumadin clinic for any questions [phone].

## 2019-06-11 ENCOUNTER — Telehealth (HOSPITAL_COMMUNITY): Payer: Self-pay

## 2019-06-11 ENCOUNTER — Ambulatory Visit (HOSPITAL_COMMUNITY)
Admission: RE | Admit: 2019-06-11 | Discharge: 2019-06-11 | Disposition: A | Discharge status: Home / Self Care | Payer: Medicare Other | Source: Ambulatory Visit | Attending: Internal Medicine | Admitting: Internal Medicine

## 2019-06-11 DIAGNOSIS — I5032 Chronic diastolic (congestive) heart failure: Secondary | ICD-10-CM

## 2019-06-11 LAB — BASIC METABOLIC PANEL
Anion gap: 14 (ref 5–15)
BUN: 90 mg/dL — ABNORMAL HIGH (ref 8–23)
CO2: 27 mmol/L (ref 22–32)
Calcium: 9 mg/dL (ref 8.9–10.3)
Chloride: 96 mmol/L — ABNORMAL LOW (ref 98–111)
Creatinine, Ser: 2.68 mg/dL — ABNORMAL HIGH (ref 0.61–1.24)
GFR calc Af Amer: 25 mL/min — ABNORMAL LOW (ref >60)
GFR calc non Af Amer: 21 mL/min — ABNORMAL LOW (ref >60)
Glucose, Bld: 123 mg/dL — ABNORMAL HIGH (ref 70–99)
Potassium: 3.4 mmol/L — ABNORMAL LOW (ref 3.5–5.1)
Sodium: 137 mmol/L (ref 135–145)

## 2019-06-11 MED ORDER — POTASSIUM CHLORIDE CRYS ER 20 MEQ PO TBCR: EXTENDED_RELEASE_TABLET | ORAL | 2 refills | Status: DC

## 2019-06-11 NOTE — Telephone Encounter (Signed)
-----   Message from Larey Dresser, MD sent at 06/11/2019 11:28 AM EST ----- Increase total daily KCl by 20 mEq, repeat BMET 1 week to follow creatinine.

## 2019-06-11 NOTE — Telephone Encounter (Signed)
Pt aware of results will add additional 20 meq kcl to daily dose. Pt will return in 1 week to check kidney function. Verbalized understanding

## 2019-06-12 ENCOUNTER — Other Ambulatory Visit (HOSPITAL_COMMUNITY): Payer: Medicare Other

## 2019-06-12 DIAGNOSIS — H25812 Combined forms of age-related cataract, left eye: Secondary | ICD-10-CM | POA: Diagnosis not present

## 2019-06-12 DIAGNOSIS — H2512 Age-related nuclear cataract, left eye: Secondary | ICD-10-CM | POA: Diagnosis not present

## 2019-06-18 ENCOUNTER — Ambulatory Visit (HOSPITAL_COMMUNITY)
Admission: RE | Admit: 2019-06-18 | Discharge: 2019-06-18 | Disposition: A | Discharge status: Home / Self Care | Payer: Medicare Other | Source: Ambulatory Visit | Attending: Internal Medicine | Admitting: Internal Medicine

## 2019-06-18 ENCOUNTER — Other Ambulatory Visit: Payer: Self-pay

## 2019-06-18 DIAGNOSIS — I5032 Chronic diastolic (congestive) heart failure: Secondary | ICD-10-CM | POA: Diagnosis not present

## 2019-06-18 LAB — BASIC METABOLIC PANEL
Anion gap: 15 (ref 5–15)
BUN: 89 mg/dL — ABNORMAL HIGH (ref 8–23)
CO2: 28 mmol/L (ref 22–32)
Calcium: 9.3 mg/dL (ref 8.9–10.3)
Chloride: 94 mmol/L — ABNORMAL LOW (ref 98–111)
Creatinine, Ser: 3 mg/dL — ABNORMAL HIGH (ref 0.61–1.24)
GFR calc Af Amer: 21 mL/min — ABNORMAL LOW (ref >60)
GFR calc non Af Amer: 18 mL/min — ABNORMAL LOW (ref >60)
Glucose, Bld: 129 mg/dL — ABNORMAL HIGH (ref 70–99)
Potassium: 3.9 mmol/L (ref 3.5–5.1)
Sodium: 137 mmol/L (ref 135–145)

## 2019-06-19 ENCOUNTER — Telehealth (HOSPITAL_COMMUNITY): Payer: Self-pay

## 2019-06-19 DIAGNOSIS — I5032 Chronic diastolic (congestive) heart failure: Secondary | ICD-10-CM

## 2019-06-19 NOTE — Telephone Encounter (Signed)
-----   Message from Larey Dresser, MD sent at 06/18/2019  9:26 PM EDT ----- Creatinine is mildly higher with stable BUN.  No changes for now. BMET again in 2 wks.

## 2019-06-19 NOTE — Telephone Encounter (Signed)
Pt aware of results. appt scheduled for 2 weeks for repeat labs

## 2019-06-23 ENCOUNTER — Encounter: Payer: Self-pay | Admitting: Internal Medicine

## 2019-06-23 ENCOUNTER — Other Ambulatory Visit: Payer: Self-pay

## 2019-06-23 ENCOUNTER — Ambulatory Visit (INDEPENDENT_AMBULATORY_CARE_PROVIDER_SITE_OTHER): Payer: Medicare Other | Admitting: Internal Medicine

## 2019-06-23 ENCOUNTER — Ambulatory Visit (INDEPENDENT_AMBULATORY_CARE_PROVIDER_SITE_OTHER): Payer: Medicare Other

## 2019-06-23 DIAGNOSIS — I50812 Chronic right heart failure: Secondary | ICD-10-CM

## 2019-06-23 DIAGNOSIS — M79672 Pain in left foot: Secondary | ICD-10-CM

## 2019-06-23 DIAGNOSIS — J45909 Unspecified asthma, uncomplicated: Secondary | ICD-10-CM

## 2019-06-23 DIAGNOSIS — C4490 Unspecified malignant neoplasm of skin, unspecified: Secondary | ICD-10-CM | POA: Insufficient documentation

## 2019-06-23 DIAGNOSIS — M79671 Pain in right foot: Secondary | ICD-10-CM | POA: Diagnosis not present

## 2019-06-23 DIAGNOSIS — S99921A Unspecified injury of right foot, initial encounter: Secondary | ICD-10-CM | POA: Diagnosis not present

## 2019-06-23 MED ORDER — ALBUTEROL SULFATE HFA 108 (90 BASE) MCG/ACT IN AERS: 2.0000 | INHALATION_SPRAY | RESPIRATORY_TRACT | 5 refills | Status: DC | PRN

## 2019-06-23 NOTE — Assessment & Plan Note (Addendum)
  Nodular skin growth 3x1.5 cm - L chest  AK - scalp

## 2019-06-23 NOTE — Progress Notes (Signed)
Subjective:  Patient ID: [name], male    DOB: 08-17-1935  Age: 84 y.o. MRN: [id number]  CC: No chief complaint on file.   HPI [name] presents for CHF, edema, asthma f/u C/o skin lesion C/o L fopt pain - dropped a box  Outpatient Medications Prior to Visit  Medication Sig Dispense Refill  . acetaminophen (TYLENOL) 500 MG tablet Take 1,000 mg by mouth every 6 (six) hours as needed for moderate pain or headache.    . allopurinol (ZYLOPRIM) 100 MG tablet Take 1 tablet daily 30 tablet 6  . amiodarone (PACERONE) 200 MG tablet Take 1 tablet (200 mg total) by mouth daily. 90 tablet 3  . DUREZOL 0.05 % EMUL Place 1 drop into the right eye 4 (four) times daily.    Marland Kitchen ezetimibe-simvastatin (VYTORIN) 10-20 MG tablet TAKE 1 TABLET BY MOUTH EVERYDAY AT BEDTIME 90 tablet 1  . finasteride (PROSCAR) 5 MG tablet Take 5 mg by mouth daily.    Marland Kitchen levothyroxine (SYNTHROID, LEVOTHROID) 50 MCG tablet Take 1 tablet (50 mcg total) by mouth daily before breakfast. 90 tablet 3  . metolazone (ZAROXOLYN) 2.5 MG tablet Take 1 tablet (2.5 mg total) by mouth 2 (two) times a week. Mondays and Thursdays 10 tablet 5  . moxifloxacin (VIGAMOX) 0.5 % ophthalmic solution PLACE 1 DROP INTO RIGHT EYE 4 TIMES A DAY STARTING 2 DAYS BEFORE SURGERY AND 2DROPS THE MORNING OF    . polyethylene glycol powder (GLYCOLAX/MIRALAX) powder Take 17 g by mouth daily as needed for moderate constipation.    . potassium chloride SA (KLOR-CON M20) 20 MEQ tablet Take 4 tablets (80 mEq total) by mouth every morning AND 3 tablets (60 mEq total) every evening. And take additional 98meq on Mondays and Thursdays with Metolazone. 210 tablet 2  . tamsulosin (FLOMAX) 0.4 MG CAPS capsule Take 0.4 mg by mouth daily after supper.     . torsemide (DEMADEX) 100 MG tablet Take 1 tablet (100 mg total) by mouth 2 (two) times daily. 180 tablet 3  . triamcinolone ointment (KENALOG) 0.1 % Apply 1 application topically 2 (two) times daily. (Patient taking  differently: Apply 1 application topically daily as needed. ) 80 g 1  . warfarin (COUMADIN) 5 MG tablet TAKE 1 & 1/2 TABLETS DAILY EXCEPT 1 TABLET ON TUES AND THURS OR AS DIRECTED BY COUMADIN CLINIC 120 tablet 0  . carvedilol (COREG) 3.125 MG tablet Take 1 tablet (3.125 mg total) by mouth 2 (two) times daily. 60 tablet 6   No facility-administered medications prior to visit.    ROS: Review of Systems  Constitutional: Negative for appetite change, fatigue and unexpected weight change.  HENT: Negative for congestion, nosebleeds, sneezing, sore throat and trouble swallowing.   Eyes: Negative for itching and visual disturbance.  Respiratory: Positive for shortness of breath and wheezing. Negative for cough.   Cardiovascular: Positive for leg swelling. Negative for chest pain and palpitations.  Gastrointestinal: Negative for abdominal distention, blood in stool, diarrhea and nausea.  Genitourinary: Negative for frequency and hematuria.  Musculoskeletal: Positive for gait problem. Negative for back pain, joint swelling and neck pain.  Skin: Negative for rash.  Neurological: Negative for dizziness, tremors, speech difficulty and weakness.  Psychiatric/Behavioral: Negative for agitation, dysphoric mood and sleep disturbance. The patient is not nervous/anxious.     Objective:  BP (!) 114/58 (BP Location: Right Arm, Patient Position: Sitting, Cuff Size: Large)   Pulse 77   Temp 98.2 F (36.8 C) (Oral)   Ht  6\' 2"  (1.88 m)   Wt 243 lb (110.2 kg)   SpO2 93%   BMI 31.20 kg/m   BP Readings from Last 3 Encounters:  06/23/19 (!) 114/58  06/02/19 (!) 122/50  05/13/19 118/62    Wt Readings from Last 3 Encounters:  06/23/19 243 lb (110.2 kg)  06/02/19 244 lb 6.4 oz (110.9 kg)  05/13/19 235 lb 12.8 oz (107 kg)    Physical Exam Constitutional:      General: He is not in acute distress.    Appearance: He is well-developed.     Comments: NAD  Eyes:     Conjunctiva/sclera: Conjunctivae  normal.     Pupils: Pupils are equal, round, and reactive to light.  Neck:     Thyroid: No thyromegaly.     Vascular: No JVD.  Cardiovascular:     Rate and Rhythm: Normal rate and regular rhythm.     Heart sounds: Normal heart sounds. No murmur. No friction rub. No gallop.   Pulmonary:     Effort: Pulmonary effort is normal. No respiratory distress.     Breath sounds: Normal breath sounds. No wheezing or rales.  Chest:     Chest wall: No tenderness.  Abdominal:     General: Bowel sounds are normal. There is no distension.     Palpations: Abdomen is soft. There is no mass.     Tenderness: There is no abdominal tenderness. There is no guarding or rebound.  Musculoskeletal:        General: Swelling present. No tenderness. Normal range of motion.     Cervical back: Normal range of motion.  Lymphadenopathy:     Cervical: No cervical adenopathy.  Skin:    General: Skin is warm and dry.     Findings: No rash.  Neurological:     Mental Status: He is alert and oriented to person, place, and time.     Cranial Nerves: No cranial nerve deficit.     Motor: No abnormal muscle tone.     Coordination: Coordination abnormal.     Gait: Gait normal.     Deep Tendon Reflexes: Reflexes are normal and symmetric.  Psychiatric:        Behavior: Behavior normal.        Thought Content: Thought content normal.        Judgment: Judgment normal.   Nodular skin growth 3x1.5 cm - L chest - ?cancer and scalp - scab Edema 2+ B wrapped Walker L foot  Bruised   Lab Results  Component Value Date   WBC 3.2 (L) 04/21/2019   HGB 10.5 (L) 04/21/2019   HCT 32.4 (L) 04/21/2019   PLT 89 (L) 04/21/2019   GLUCOSE 129 (H) 06/18/2019   CHOL 131 04/21/2019   TRIG 65 04/21/2019   HDL 54 04/21/2019   LDLCALC 64 04/21/2019   ALT 13 06/02/2019   AST 19 06/02/2019   NA 137 06/18/2019   K 3.9 06/18/2019   CL 94 (L) 06/18/2019   CREATININE 3.00 (H) 06/18/2019   BUN 89 (H) 06/18/2019   CO2 28 06/18/2019   TSH  15.563 (H) 06/02/2019   PSA 2.72 09/24/2018   INR 4.8 (A) 06/10/2019   HGBA1C 5.6 03/25/2019   MICROALBUR 6.4 (H) 10/15/2017    No results found.  Assessment & Plan:     Follow-up: No follow-ups on file.  Walker Kehr, MD

## 2019-06-23 NOTE — Assessment & Plan Note (Signed)
X ray

## 2019-06-23 NOTE — Assessment & Plan Note (Signed)
Proair MDI prn 

## 2019-06-23 NOTE — Assessment & Plan Note (Signed)
F/u w/Cardiology 

## 2019-06-23 NOTE — Assessment & Plan Note (Signed)
On oral diuretics per [name]

## 2019-06-24 ENCOUNTER — Telehealth (HOSPITAL_COMMUNITY): Payer: Self-pay | Admitting: *Deleted

## 2019-06-24 NOTE — Telephone Encounter (Signed)
Beth Scism,NP with remote home health. Left VM stating pt has gained weight and has abnormal lung sounds. Beth requested we move patients appt up from 4/12 to something sooner. I called her back to get more information and to tell her patients appt is scheduled with an echo. Beth  informed me patients weight was up 4lbs overnight she had a virtual visit with him today and had him take 2.5mg  of metolazone today and she is also working on getting him into the heart failure remote health program. At this time patient will keep scheduled appointment and she will continue to monitor patients weight. If weight continues to increase she will contact our office for a work in appt.

## 2019-06-25 DIAGNOSIS — I50812 Chronic right heart failure: Secondary | ICD-10-CM | POA: Diagnosis not present

## 2019-06-25 DIAGNOSIS — I70219 Atherosclerosis of native arteries of extremities with intermittent claudication, unspecified extremity: Secondary | ICD-10-CM | POA: Diagnosis not present

## 2019-06-28 ENCOUNTER — Other Ambulatory Visit (HOSPITAL_COMMUNITY): Payer: Self-pay | Admitting: Cardiology

## 2019-07-01 ENCOUNTER — Ambulatory Visit (INDEPENDENT_AMBULATORY_CARE_PROVIDER_SITE_OTHER): Payer: Medicare Other | Admitting: *Deleted

## 2019-07-01 ENCOUNTER — Other Ambulatory Visit: Payer: Self-pay

## 2019-07-01 ENCOUNTER — Other Ambulatory Visit (HOSPITAL_COMMUNITY): Payer: Medicare Other

## 2019-07-01 ENCOUNTER — Telehealth: Payer: Self-pay | Admitting: *Deleted

## 2019-07-01 ENCOUNTER — Encounter (HOSPITAL_COMMUNITY): Payer: Self-pay

## 2019-07-01 DIAGNOSIS — I4891 Unspecified atrial fibrillation: Secondary | ICD-10-CM

## 2019-07-01 DIAGNOSIS — Z952 Presence of prosthetic heart valve: Secondary | ICD-10-CM | POA: Diagnosis not present

## 2019-07-01 LAB — PROTIME-INR
INR: >10 (ref 0.9–1.2)
INR: 10 (ref 0.8–1.2)
Prothrombin Time: >120 s — ABNORMAL HIGH (ref 9.1–12.0)
Prothrombin Time: 80.4 seconds — ABNORMAL HIGH (ref 11.4–15.2)

## 2019-07-01 LAB — POCT INR: INR: 8 — AB (ref 2.0–3.0)

## 2019-07-01 LAB — BASIC METABOLIC PANEL
BUN/Creatinine Ratio: 30 — ABNORMAL HIGH (ref 10–24)
BUN: 87 mg/dL (ref 8–27)
CO2: 23 mmol/L (ref 20–29)
Calcium: 9 mg/dL (ref 8.6–10.2)
Chloride: 98 mmol/L (ref 96–106)
Creatinine, Ser: 2.92 mg/dL — ABNORMAL HIGH (ref 0.76–1.27)
GFR calc Af Amer: 22 mL/min/{1.73_m2} — ABNORMAL LOW (ref >59)
GFR calc non Af Amer: 19 mL/min/{1.73_m2} — ABNORMAL LOW (ref >59)
Glucose: 134 mg/dL — ABNORMAL HIGH (ref 65–99)
Potassium: 4.9 mmol/L (ref 3.5–5.2)
Sodium: 138 mmol/L (ref 134–144)

## 2019-07-01 MED ORDER — PHYTONADIONE 5 MG PO TABS: 2.5000 mg | ORAL_TABLET | Freq: Once | ORAL | 0 refills | Status: AC

## 2019-07-01 NOTE — Telephone Encounter (Signed)
See anticoagulation note from 07/01/19

## 2019-07-01 NOTE — Patient Instructions (Addendum)
Description   Spoke with pt and instructed him to hold warfarin today and tomorrow and to take Vitamin K 2.5 mg once. Recheck INR on Thursday 07/03/2019. Call coumadin clinic for any questions [phone].   Normal dose of Warfarin:1 tablet daily except for 1.5 tablets on Monday , Wednesday and Friday.

## 2019-07-01 NOTE — Progress Notes (Signed)
INR greater then 8 on POC machine. Pt sent to lab for pt/INR verification. Instructed pt not take any warfarin today and that we would call him with results of the INR and give him further dosing instructions. Informed pt that he is at a higher risk for bleeding so if he has any falls or notices any bleeding then to seek medical attention.   Verified pt has the correct phone number on file and made him aware that he should expect a phone call from coumadin clinic later on today.   Clinic lab called stating that lab INR was greater then 10.   Lab sent to hospital for another verification.   Hospital lab called stating that INR was 10.   Spoke with Milas Hock D, will send in prescription for  vitamin K 2.5 mg once.   Called [facility] on [address] who stated that they had the vitamin K in stock and that it should cost the pt $5.   Instructed pt hold warfarin until he comes in on Thursday to recheck his INR and he receives further dosing instructions. Informed him to take Vitamin K 2.5mg - one time dose and that the medication will be filled at the [facility] on [address].

## 2019-07-01 NOTE — Telephone Encounter (Signed)
Mel from [facility] calling back to give results. Transferred to [employer].

## 2019-07-01 NOTE — Telephone Encounter (Signed)
Mel from [facility] called in critical INR 10.0. I informed Mel with [facility] that I will send urgent note to PharmD in our our [facility] dept.

## 2019-07-02 ENCOUNTER — Other Ambulatory Visit (HOSPITAL_COMMUNITY): Payer: Medicare Other

## 2019-07-02 DIAGNOSIS — I482 Chronic atrial fibrillation, unspecified: Secondary | ICD-10-CM | POA: Diagnosis not present

## 2019-07-02 DIAGNOSIS — I70219 Atherosclerosis of native arteries of extremities with intermittent claudication, unspecified extremity: Secondary | ICD-10-CM | POA: Diagnosis not present

## 2019-07-02 DIAGNOSIS — I5032 Chronic diastolic (congestive) heart failure: Secondary | ICD-10-CM | POA: Diagnosis not present

## 2019-07-02 DIAGNOSIS — R2241 Localized swelling, mass and lump, right lower limb: Secondary | ICD-10-CM | POA: Diagnosis not present

## 2019-07-02 DIAGNOSIS — I4891 Unspecified atrial fibrillation: Secondary | ICD-10-CM | POA: Diagnosis not present

## 2019-07-03 ENCOUNTER — Other Ambulatory Visit: Payer: Self-pay

## 2019-07-03 ENCOUNTER — Ambulatory Visit (INDEPENDENT_AMBULATORY_CARE_PROVIDER_SITE_OTHER): Payer: Medicare Other

## 2019-07-03 DIAGNOSIS — Z952 Presence of prosthetic heart valve: Secondary | ICD-10-CM

## 2019-07-03 DIAGNOSIS — I4891 Unspecified atrial fibrillation: Secondary | ICD-10-CM

## 2019-07-03 LAB — POCT INR: INR: 3.3 — AB (ref 2.0–3.0)

## 2019-07-03 NOTE — Patient Instructions (Signed)
Description   Resume your Warfarin, start taking 1 tablet daily except 1.5 tablets on Fridays.  Recheck in 1 week.

## 2019-07-04 DIAGNOSIS — I5032 Chronic diastolic (congestive) heart failure: Secondary | ICD-10-CM | POA: Diagnosis not present

## 2019-07-04 DIAGNOSIS — I50812 Chronic right heart failure: Secondary | ICD-10-CM | POA: Diagnosis not present

## 2019-07-04 DIAGNOSIS — I70219 Atherosclerosis of native arteries of extremities with intermittent claudication, unspecified extremity: Secondary | ICD-10-CM | POA: Diagnosis not present

## 2019-07-05 ENCOUNTER — Other Ambulatory Visit: Payer: Self-pay | Admitting: Interventional Cardiology

## 2019-07-07 ENCOUNTER — Encounter (HOSPITAL_COMMUNITY): Payer: Medicare Other | Admitting: Cardiology

## 2019-07-07 DIAGNOSIS — I50812 Chronic right heart failure: Secondary | ICD-10-CM | POA: Diagnosis not present

## 2019-07-07 DIAGNOSIS — I5032 Chronic diastolic (congestive) heart failure: Secondary | ICD-10-CM | POA: Diagnosis not present

## 2019-07-08 ENCOUNTER — Ambulatory Visit (INDEPENDENT_AMBULATORY_CARE_PROVIDER_SITE_OTHER): Payer: Medicare Other | Admitting: Pharmacist

## 2019-07-08 DIAGNOSIS — I480 Paroxysmal atrial fibrillation: Secondary | ICD-10-CM | POA: Diagnosis not present

## 2019-07-08 DIAGNOSIS — I5032 Chronic diastolic (congestive) heart failure: Secondary | ICD-10-CM | POA: Diagnosis not present

## 2019-07-08 DIAGNOSIS — I50812 Chronic right heart failure: Secondary | ICD-10-CM | POA: Diagnosis not present

## 2019-07-08 DIAGNOSIS — D696 Thrombocytopenia, unspecified: Secondary | ICD-10-CM | POA: Diagnosis not present

## 2019-07-08 DIAGNOSIS — Z952 Presence of prosthetic heart valve: Secondary | ICD-10-CM | POA: Diagnosis not present

## 2019-07-08 LAB — POCT INR: INR: 5 — AB (ref 2.0–3.0)

## 2019-07-09 ENCOUNTER — Telehealth: Payer: Self-pay | Admitting: *Deleted

## 2019-07-09 NOTE — Telephone Encounter (Signed)
Received a voicemail from [place] with [facility] inquiring about patients dose of Warfarin since pt could not find the warfarin doseage. Left her a message that the pt was instructed to hold yesterday's dose and today's dose then start taking 1 tablet (5mg ) daily except 1/2 tablet (2.5mg ) on Sunday and Thursday and the recheck INR on 07/14/2019 while at Dr. Claris Gladden office.   Called the pt to ensure he knew his dose of Warfarin per instructions from yesterday and he states he found his Warfarin schedule in MyChart and he was able to tell me the dosing instructions and when he was going to have INR checked. Also, he was able to let [facility] nurse know while she there.

## 2019-07-10 DIAGNOSIS — I482 Chronic atrial fibrillation, unspecified: Secondary | ICD-10-CM | POA: Diagnosis not present

## 2019-07-10 DIAGNOSIS — Z7901 Long term (current) use of anticoagulants: Secondary | ICD-10-CM | POA: Diagnosis not present

## 2019-07-14 ENCOUNTER — Ambulatory Visit (HOSPITAL_COMMUNITY)
Admission: RE | Admit: 2019-07-14 | Discharge: 2019-07-14 | Disposition: A | Discharge status: Home / Self Care | Payer: Medicare Other | Source: Ambulatory Visit | Attending: Cardiology | Admitting: Cardiology

## 2019-07-14 ENCOUNTER — Encounter (HOSPITAL_COMMUNITY): Payer: Self-pay | Admitting: Cardiology

## 2019-07-14 ENCOUNTER — Other Ambulatory Visit (HOSPITAL_COMMUNITY): Payer: Self-pay

## 2019-07-14 ENCOUNTER — Ambulatory Visit (INDEPENDENT_AMBULATORY_CARE_PROVIDER_SITE_OTHER): Payer: Self-pay | Admitting: Pharmacist

## 2019-07-14 ENCOUNTER — Ambulatory Visit (HOSPITAL_BASED_OUTPATIENT_CLINIC_OR_DEPARTMENT_OTHER)
Admission: RE | Admit: 2019-07-14 | Discharge: 2019-07-14 | Disposition: A | Discharge status: Home / Self Care | Payer: Medicare Other | Source: Ambulatory Visit | Attending: Cardiology | Admitting: Cardiology

## 2019-07-14 ENCOUNTER — Other Ambulatory Visit: Payer: Self-pay

## 2019-07-14 VITALS — BP 128/64 | HR 56 | Wt 242.0 lb

## 2019-07-14 DIAGNOSIS — Z8249 Family history of ischemic heart disease and other diseases of the circulatory system: Secondary | ICD-10-CM | POA: Insufficient documentation

## 2019-07-14 DIAGNOSIS — D693 Immune thrombocytopenic purpura: Secondary | ICD-10-CM | POA: Diagnosis not present

## 2019-07-14 DIAGNOSIS — Z881 Allergy status to other antibiotic agents status: Secondary | ICD-10-CM | POA: Diagnosis not present

## 2019-07-14 DIAGNOSIS — I5032 Chronic diastolic (congestive) heart failure: Secondary | ICD-10-CM

## 2019-07-14 DIAGNOSIS — Z7901 Long term (current) use of anticoagulants: Secondary | ICD-10-CM | POA: Diagnosis not present

## 2019-07-14 DIAGNOSIS — Z801 Family history of malignant neoplasm of trachea, bronchus and lung: Secondary | ICD-10-CM | POA: Diagnosis not present

## 2019-07-14 DIAGNOSIS — N184 Chronic kidney disease, stage 4 (severe): Secondary | ICD-10-CM | POA: Diagnosis not present

## 2019-07-14 DIAGNOSIS — D649 Anemia, unspecified: Secondary | ICD-10-CM | POA: Insufficient documentation

## 2019-07-14 DIAGNOSIS — E1122 Type 2 diabetes mellitus with diabetic chronic kidney disease: Secondary | ICD-10-CM | POA: Insufficient documentation

## 2019-07-14 DIAGNOSIS — I4891 Unspecified atrial fibrillation: Secondary | ICD-10-CM | POA: Diagnosis not present

## 2019-07-14 DIAGNOSIS — I5042 Chronic combined systolic (congestive) and diastolic (congestive) heart failure: Secondary | ICD-10-CM | POA: Insufficient documentation

## 2019-07-14 DIAGNOSIS — Z952 Presence of prosthetic heart valve: Secondary | ICD-10-CM | POA: Diagnosis not present

## 2019-07-14 DIAGNOSIS — Z87891 Personal history of nicotine dependence: Secondary | ICD-10-CM | POA: Insufficient documentation

## 2019-07-14 DIAGNOSIS — Z79899 Other long term (current) drug therapy: Secondary | ICD-10-CM | POA: Diagnosis not present

## 2019-07-14 DIAGNOSIS — E039 Hypothyroidism, unspecified: Secondary | ICD-10-CM | POA: Insufficient documentation

## 2019-07-14 DIAGNOSIS — I051 Rheumatic mitral insufficiency: Secondary | ICD-10-CM | POA: Insufficient documentation

## 2019-07-14 DIAGNOSIS — Z79890 Hormone replacement therapy (postmenopausal): Secondary | ICD-10-CM | POA: Insufficient documentation

## 2019-07-14 DIAGNOSIS — I482 Chronic atrial fibrillation, unspecified: Secondary | ICD-10-CM | POA: Diagnosis not present

## 2019-07-14 DIAGNOSIS — N1832 Chronic kidney disease, stage 3b: Secondary | ICD-10-CM | POA: Insufficient documentation

## 2019-07-14 DIAGNOSIS — I2510 Atherosclerotic heart disease of native coronary artery without angina pectoris: Secondary | ICD-10-CM | POA: Diagnosis not present

## 2019-07-14 DIAGNOSIS — G4733 Obstructive sleep apnea (adult) (pediatric): Secondary | ICD-10-CM | POA: Insufficient documentation

## 2019-07-14 DIAGNOSIS — Z951 Presence of aortocoronary bypass graft: Secondary | ICD-10-CM | POA: Diagnosis not present

## 2019-07-14 DIAGNOSIS — I480 Paroxysmal atrial fibrillation: Secondary | ICD-10-CM

## 2019-07-14 DIAGNOSIS — I340 Nonrheumatic mitral (valve) insufficiency: Secondary | ICD-10-CM

## 2019-07-14 DIAGNOSIS — I130 Hypertensive heart and chronic kidney disease with heart failure and stage 1 through stage 4 chronic kidney disease, or unspecified chronic kidney disease: Secondary | ICD-10-CM | POA: Insufficient documentation

## 2019-07-14 DIAGNOSIS — J449 Chronic obstructive pulmonary disease, unspecified: Secondary | ICD-10-CM | POA: Insufficient documentation

## 2019-07-14 LAB — CBC
HCT: 28.3 % — ABNORMAL LOW (ref 39.0–52.0)
Hemoglobin: 8.8 g/dL — ABNORMAL LOW (ref 13.0–17.0)
MCH: 37 pg — ABNORMAL HIGH (ref 26.0–34.0)
MCHC: 31.1 g/dL (ref 30.0–36.0)
MCV: 118.9 fL — ABNORMAL HIGH (ref 80.0–100.0)
Platelets: 114 10*3/uL — ABNORMAL LOW (ref 150–400)
RBC: 2.38 MIL/uL — ABNORMAL LOW (ref 4.22–5.81)
RDW: 16.5 % — ABNORMAL HIGH (ref 11.5–15.5)
WBC: 4.1 10*3/uL (ref 4.0–10.5)
nRBC: 0 % (ref 0.0–0.2)

## 2019-07-14 LAB — IRON AND TIBC
Iron: 63 ug/dL (ref 45–182)
Saturation Ratios: 15 % — ABNORMAL LOW (ref 17.9–39.5)
TIBC: 427 ug/dL (ref 250–450)
UIBC: 364 ug/dL

## 2019-07-14 LAB — COMPREHENSIVE METABOLIC PANEL
ALT: 13 U/L (ref 0–44)
AST: 18 U/L (ref 15–41)
Albumin: 3.5 g/dL (ref 3.5–5.0)
Alkaline Phosphatase: 112 U/L (ref 38–126)
Anion gap: 15 (ref 5–15)
BUN: 88 mg/dL — ABNORMAL HIGH (ref 8–23)
CO2: 27 mmol/L (ref 22–32)
Calcium: 9.2 mg/dL (ref 8.9–10.3)
Chloride: 96 mmol/L — ABNORMAL LOW (ref 98–111)
Creatinine, Ser: 2.72 mg/dL — ABNORMAL HIGH (ref 0.61–1.24)
GFR calc Af Amer: 24 mL/min — ABNORMAL LOW (ref >60)
GFR calc non Af Amer: 21 mL/min — ABNORMAL LOW (ref >60)
Glucose, Bld: 131 mg/dL — ABNORMAL HIGH (ref 70–99)
Potassium: 3.6 mmol/L (ref 3.5–5.1)
Sodium: 138 mmol/L (ref 135–145)
Total Bilirubin: 1.3 mg/dL — ABNORMAL HIGH (ref 0.3–1.2)
Total Protein: 7.2 g/dL (ref 6.5–8.1)

## 2019-07-14 LAB — PROTIME-INR
INR: 2.7 — ABNORMAL HIGH (ref 0.8–1.2)
Prothrombin Time: 28.5 seconds — ABNORMAL HIGH (ref 11.4–15.2)

## 2019-07-14 LAB — TSH: TSH: 20.834 u[IU]/mL — ABNORMAL HIGH (ref 0.350–4.500)

## 2019-07-14 LAB — FERRITIN: Ferritin: 106 ng/mL (ref 24–336)

## 2019-07-14 MED ORDER — METOLAZONE 2.5 MG PO TABS: 2.5000 mg | ORAL_TABLET | ORAL | 3 refills | Status: DC

## 2019-07-14 NOTE — Patient Instructions (Addendum)
Labs done today.we will contact you only if your labs are abnormal.  INCREASE Metolazone 2.5mg . take 1 tablet by mouth three times a week on mondays wednesdays and fridays.  No other medication changes were made. Please continue all current medications as prescribed.   Your physician recommends that you schedule a follow-up appointment in: 7-10 days for a lab only appointment. In 2 weeks with app clinic   Do the following things EVERYDAY: 1) Weigh yourself in the morning before breakfast. Write it down and keep it in a log. 2) Take your medicines as prescribed 3) Eat low salt foods--Limit salt (sodium) to 2000 mg per day.  4) Stay as active as you can everyday 5) Limit all fluids for the day to less than 2 liters.   At the [facility], you and your health needs are our priority. As part of our continuing mission to provide you with exceptional heart care, we have created designated Provider Care Teams. These Care Teams include your primary Cardiologist (physician) and Advanced Practice Providers (APPs- Physician Assistants and Nurse Practitioners) who all work together to provide you with the care you need, when you need it.   You may see any of the following providers on your designated Care Team at your next follow up: Marland Kitchen Dr Glori Bickers . Dr Loralie Champagne . Darrick Grinder, NP . Lyda Jester, PA . Audry Riles, PharmD   Please be sure to bring in all your medications bottles to every appointment.

## 2019-07-14 NOTE — Progress Notes (Signed)
Advanced Heart Failure Clinic Note   PCP: [name], [name], MD PCP-Cardiologist: [name], MD  HF: [name]  HPI: [name] is a 84 y.o. male with h/o mechanical AVR 1994, OSA on CPAP, Chronic afib, chronic combined CHF, CKD III, chronic coumadin therapy, CAD s/p CAGB 1994, and COPD.   Admitted 7/18-11/05/17 from [facility] office with A/C diastolic HF. Diuresed 83 lbs with lasix drip, metolazone, and diamox. Transitioned to torsemide 80 mg BID. Determined not to be a candidate for MitraClip. Dr [name] also consulted and recommended aggressive medical therapy versus  percutaneous MVR trial as he felt very high risk for conventional surgery. Course complicated by drop in hemoglobin and symptomatic hypotension, requiring 1 unit pRBCs. GI consulted and did not recommend scoping. BB and spiro were stopped due to low BPs. Had some AKI assoicated with low BP, but resolved by discharge. Required lovenox bridge at DC with subtherapeutic INR. Discharged with [facility]. DC weight: 216 lbs.   Admitted 9/11 - 12/14/17 for dental extractions in setting of work up for MVR. Hospital course complicated by run of VT in the setting of hypokalemia. Started on amiodarone with h/o of arrhythmia. Bridged with lovenox.   Admitted 9/22 - 12/28/17 with symptomatic anemia from bleeding gums. Bleeding stopped with holding heparin and coumadin. Received multiple units of blood.   He was admitted again in 10/19 with left thigh hematoma and went from there to inpatient rehab.   He has been evaluated in [place] for a transcatheter mitral valve.  RHC/LHC was done in 1/20 as part of his pre-op evaluation.  Filling pressures and cardiac output were well-compensated, he had patent SVG-PDA.  Echo in 1/20 showed EF 40-45%, moderate RV dilation with normal systolic function, severe MR with likely rheumatic mitral valve, normal St Jude mechanical aortic valve. Unfortunately, it was determined that he would not qualify for any of  the percutaneous mitral valve studies.    His diuretic doses have been up and down recently based on volume retention and creatinine.   Echo today was reviewed, EF 50-55% with moderate LV dilation, severe MR, mildly decreased RV systolic function, dilated IVC, stable mechanical aortic valve.   He returns for followup of diastolic CHF, mechanical aortic valve, chronic atrial fibrillation, and CAD.  Today, weight is down 2 lbs.  INR got up very high recently, now decreasing again.  He noted black stool when INR was up to 10, stool now normal.  He uses a walker chronically.  He has chronic orthopnea.  No chest pain. No dyspnea walking around the house but is short of breath walking longer distances.  Still with significant leg edema.   ECG (personally reviewed): atrial fibrillation, IVCD 130 msec    Labs (10/19): hgb 9.2 Labs (11/19): K 3.2, creatinine 1.31 Labs (1/20): K 4.5, creatinine 1.38 Labs (2/20): LDL 64 Labs (6/20): K 4.1, creatinine 1.8, hgb 12.9 Labs (1/21): LDL 64 Labs (2/21): K 4, creatinine 2.9 => 2.47 Labs (3/21): K 4.9, creatinine 3 => 2.92, LFTs normal, elevated TSH with normal free T3 and T4  ROS:  All systems reviewed and negative except as per HPI.    PMH: 1. Carotid stenosis: S/p left CEA in 1994.  2. Atrial fibrillation: Chronic.  He is on warfarin with mechanical aortic valve.  3. Type II diabetes.  4. COPD: PFTs (11/19) with moderate obstructive airways disease and severely decreased DLCO.  5. OSA: Uses CPAP.  6. 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  7. ITP 8. VT: On amiodarone.  9. CKD: Stage 3.  10. 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  11. Chronic systolic CHF: TEE (5/05) with EF 55-60%, D-shaped septum, mildly dilated RV with mildly decreased systolic function, mechanical aortic valve, rheumatic-appearing mitral valve with severe MR, minimal  stenosis.  - RHC (1/20): mean RA 6, PA 54/20, mean PCWP 15, CI 3.42, PVR 2.1 WU, PAPI 5.2 - Echo (1/20): EF 40-45%, moderate RV dilation with normal systolic function, severe biatrial enlargement, severe rheumatic MR, St Jude mechanical aortic valve functioning normally.  - Echo (3/21): EF 50-55% with moderate LV dilation, severe MR, mildly decreased RV systolic function, dilated IVC, stable mechanical aortic valve.  12. ABIs (12/18): Normal.  13. CAD: S/p CABG 1994.  - LHC (1/20): 50% D1, 60% ostial LCx, totally occluded RCA with patent SVG-PDA.     Current Outpatient Medications  Medication Sig Dispense Refill  . acetaminophen (TYLENOL) 500 MG tablet Take 1,000 mg by mouth every 6 (six) hours as needed for moderate pain or headache.    . albuterol (PROAIR HFA) 108 (90 Base) MCG/ACT inhaler Inhale 2 puffs into the lungs every 4 (four) hours as needed for wheezing or shortness of breath. 18 g 5  . allopurinol (ZYLOPRIM) 100 MG tablet Take 1 tablet daily 30 tablet 6  . amiodarone (PACERONE) 200 MG tablet Take 1 tablet (200 mg total) by mouth daily. 90 tablet 3  . carvedilol (COREG) 3.125 MG tablet Take 1 tablet (3.125 mg total) by mouth 2 (two) times daily. 60 tablet 6  . ezetimibe-simvastatin (VYTORIN) 10-20 MG tablet TAKE 1 TABLET BY MOUTH EVERYDAY AT BEDTIME 90 tablet 1  . finasteride (PROSCAR) 5 MG tablet Take 5 mg by mouth daily.    Marland Kitchen levothyroxine (SYNTHROID) 50 MCG tablet TAKE 1 TABLET BY MOUTH DAILY BEFORE BREAKFAST 90 tablet 3  . metolazone (ZAROXOLYN) 2.5 MG tablet Take 1 tablet (2.5 mg total) by mouth 3 (three) times a week. On mondays,wednesdays and fridays 38 tablet 3  . polyethylene glycol powder (GLYCOLAX/MIRALAX) powder Take 17 g by mouth daily as needed for moderate constipation.    . potassium chloride SA (KLOR-CON M20) 20 MEQ tablet Take 4 tablets (80 mEq total) by mouth every morning AND 3 tablets (60 mEq total) every evening. And take additional 23meq on Mondays and Thursdays  with Metolazone. 210 tablet 2  . tamsulosin (FLOMAX) 0.4 MG CAPS capsule Take 0.4 mg by mouth daily after supper.     . torsemide (DEMADEX) 100 MG tablet Take 1 tablet (100 mg total) by mouth 2 (two) times daily. 180 tablet 3  . triamcinolone ointment (KENALOG) 0.1 % Apply 1 application topically 2 (two) times daily. (Patient taking differently: Apply 1 application topically daily as needed. ) 80 g 1  . warfarin (COUMADIN) 5 MG tablet TAKE 1 DAILY EXCEPT 1.5 TABLETS ON THURS OR AS DIRECTED BY COUMADIN CLINIC. 100 tablet 0   No current facility-administered medications for this encounter.   Allergies  Allergen Reactions  . Rifampin Rash and Other (See Comments)    May have been caused by Vancomycin or Rifampin (??)  . Vancomycin Rash and Other (See Comments)    May have been caused by Vancomycin or Rifampin (??)   Social History   Socioeconomic History  . Marital status: Married    Spouse name: Not on file  . Number of children: 3  . Years  of education: Not on file  . Highest education level: Not on file  Occupational History  . Not on file  Tobacco Use  . Smoking status: Former Smoker    Packs/day: 1.00    Years: 30.00    Pack years: 30.00    Types: Cigarettes    Quit date: 04/03/1992    Years since quitting: 27.2  . Smokeless tobacco: Never Used  Substance and Sexual Activity  . Alcohol use: Yes    Comment: 4-6 ounces of wine daily  . Drug use: No    Comment: Half a cup a day.  Marland Kitchen Sexual activity: Not on file  Other Topics Concern  . Not on file  Social History Narrative   Patient is married and lives with his wife.   Youngest daughter lives in [place] also.   Patient has 2 other children.   Social Determinants of Health   Financial Resource Strain:   . Difficulty of Paying Living Expenses:   Food Insecurity:   . Worried About Charity fundraiser in the Last Year:   . Arboriculturist in the Last Year:   Transportation Needs:   . Film/video editor  (Medical):   Marland Kitchen Lack of Transportation (Non-Medical):   Physical Activity:   . Days of Exercise per Week:   . Minutes of Exercise per Session:   Stress:   . Feeling of Stress :   Social Connections:   . Frequency of Communication with Friends and Family:   . Frequency of Social Gatherings with Friends and Family:   . Attends Religious Services:   . Active Member of Clubs or Organizations:   . Attends Archivist Meetings:   Marland Kitchen Marital Status:   Intimate Partner Violence:   . Fear of Current or Ex-Partner:   . Emotionally Abused:   Marland Kitchen Physically Abused:   . Sexually Abused:     Family History  Problem Relation Age of Onset  . Cancer Father        lung   . Hypertension Mother    Vitals:   07/14/19 1012  BP: 128/64  Pulse: (!) 56  SpO2: 94%  Weight: 109.8 kg (242 lb)     Wt Readings from Last 3 Encounters:  07/14/19 109.8 kg (242 lb)  06/23/19 110.2 kg (243 lb)  06/02/19 110.9 kg (244 lb 6.4 oz)   PHYSICAL EXAM: General: NAD Neck: JVP 12 cm, no thyromegaly or thyroid nodule.  Lungs: Clear to auscultation bilaterally with normal respiratory effort. CV: Nondisplaced PMI.  Heart irregular S1/S2, no S3/S4, mechanical S1, 2/6 SEM RUSB.  2+ edema to knees  No carotid bruit.  Normal pedal pulses.  Abdomen: Soft, nontender, no hepatosplenomegaly, no distention.  Skin: Intact without lesions or rashes.  Neurologic: Alert and oriented x 3.  Psych: Normal affect. Extremities: No clubbing or cyanosis.  HEENT: Normal.   ASSESSMENT & PLAN: 1. Chronic systolic CHF with prominent RV failure:  Echo in 3/21 with EF 50-55%, mildly decreased RV systolic function, severe rheumatic mitral regurgitation, normal mechanical aortic valve.  Severe MR likely plays a significant role in CHF and RV failure. NYHA class III.  He is volume overloaded on exam.  - Continue torsemide 100 mg bid.  - He is off spironolactone with elevated creatinine.  - Increase metolazone to 2.5 mg three  times/week. BMET today and in 1 week.  - Continue Coreg 3.125 mg bid.  2. Mechanical aortic valve: Appeared to function well on last  echo.  - Continue warfarin with INR goal 2.5-3.5 (also with atrial fibrillation).  - He is off aspirin with spontaneous thigh hematoma.   3. Atrial fibrillation: Chronic, rate is controlled.  4. CAD: s/p CABG.  No chest pain.  Cath in 1/20 with patent SVG-PDA, no interventional target.  - No ASA given stable CAD with warfarin use.   - Continue Vytorin, good lipids in 1/21.  5. COPD: He is no longer using oxygen during the day.  Moderate COPD on 11/19 PFTs.   6. OSA: Continue nightly BiPAP.  7.CKD Stage 3b: BMET today.  8. Thrombocytopenia: Chronic, has been mild/stable.  CBC today.  9. Severe MR: TEE 7/19 with severe MR with possible rheumatic MV (do not think MS is significant, mildly elevated mean gradient from high flow with severe MR).  Structural Heart team evaluated, not a Mitraclip candidate. Seen by Dr. [name], would be high risk for open surgery.  He was evaluated at [facility] in [place] for percutaneous mitral valve trials.  Unfortunately, he does not qualify for any of the available trials.  We will have to continue medical management.  10. VT: He is on amiodarone to suppress VT.  - Check LFTs.  Hypothyroidism followed by PCP.  Will need regular eye exam on amiodarone.  11. Anemia: Melena noted when INR was 10, now resolved.  - CBC today.  - Check Fe studies, IV Fe if low.   Followup with NP/PA in 2 wks.   Loralie Champagne 07/14/2019

## 2019-07-14 NOTE — Progress Notes (Signed)
  Echocardiogram 2D Echocardiogram has been performed.  [name] 07/14/2019, 9:57 AM

## 2019-07-15 ENCOUNTER — Other Ambulatory Visit (HOSPITAL_COMMUNITY): Payer: Self-pay

## 2019-07-15 ENCOUNTER — Telehealth (HOSPITAL_COMMUNITY): Payer: Self-pay

## 2019-07-15 DIAGNOSIS — D509 Iron deficiency anemia, unspecified: Secondary | ICD-10-CM

## 2019-07-15 NOTE — Telephone Encounter (Signed)
Pt aware of results. Pt scheduled for Thursday 1pm for iron infusion. Pt has f/u in 2 weeks with NP. He will repeat cbc at visit.

## 2019-07-15 NOTE — Telephone Encounter (Signed)
Pt aware of date and time of infusion. No questions endorsed. Advised to arrive at 12:45pm on 4/15.  Verbalized understanding.

## 2019-07-15 NOTE — Telephone Encounter (Signed)
-----   Message from Larey Dresser, MD sent at 07/14/2019 10:54 PM EDT ----- INR therapeutic.  Hgb down to 8.8 with low transferrin saturation.  Please arrange for IV iron infusion.  Repeat CBC 2 wks.  Creatinine stable.

## 2019-07-17 ENCOUNTER — Other Ambulatory Visit: Payer: Self-pay

## 2019-07-17 ENCOUNTER — Encounter (HOSPITAL_COMMUNITY)
Admission: RE | Admit: 2019-07-17 | Discharge: 2019-07-17 | Disposition: A | Discharge status: Home / Self Care | Payer: Medicare Other | Source: Ambulatory Visit | Attending: Cardiology | Admitting: Cardiology

## 2019-07-17 DIAGNOSIS — D509 Iron deficiency anemia, unspecified: Secondary | ICD-10-CM | POA: Diagnosis not present

## 2019-07-17 MED ORDER — SODIUM CHLORIDE 0.9 % IV SOLN
510.0000 mg | INTRAVENOUS | Status: DC
  Administered 2019-07-17: 510 mg via INTRAVENOUS
  Filled 2019-07-17: qty 17

## 2019-07-17 NOTE — Discharge Instructions (Signed)

## 2019-07-21 ENCOUNTER — Other Ambulatory Visit (HOSPITAL_COMMUNITY): Payer: Self-pay

## 2019-07-21 ENCOUNTER — Other Ambulatory Visit: Payer: Self-pay

## 2019-07-21 ENCOUNTER — Ambulatory Visit (HOSPITAL_COMMUNITY)
Admission: RE | Admit: 2019-07-21 | Discharge: 2019-07-21 | Disposition: A | Discharge status: Home / Self Care | Payer: Medicare Other | Source: Ambulatory Visit | Attending: Internal Medicine | Admitting: Internal Medicine

## 2019-07-21 ENCOUNTER — Ambulatory Visit (INDEPENDENT_AMBULATORY_CARE_PROVIDER_SITE_OTHER): Payer: Medicare Other | Admitting: Pharmacist

## 2019-07-21 DIAGNOSIS — I5032 Chronic diastolic (congestive) heart failure: Secondary | ICD-10-CM | POA: Diagnosis not present

## 2019-07-21 DIAGNOSIS — I480 Paroxysmal atrial fibrillation: Secondary | ICD-10-CM

## 2019-07-21 DIAGNOSIS — Z952 Presence of prosthetic heart valve: Secondary | ICD-10-CM | POA: Diagnosis not present

## 2019-07-21 LAB — BASIC METABOLIC PANEL
Anion gap: 12 (ref 5–15)
BUN: 74 mg/dL — ABNORMAL HIGH (ref 8–23)
CO2: 29 mmol/L (ref 22–32)
Calcium: 9.4 mg/dL (ref 8.9–10.3)
Chloride: 98 mmol/L (ref 98–111)
Creatinine, Ser: 2.44 mg/dL — ABNORMAL HIGH (ref 0.61–1.24)
GFR calc Af Amer: 28 mL/min — ABNORMAL LOW (ref >60)
GFR calc non Af Amer: 24 mL/min — ABNORMAL LOW (ref >60)
Glucose, Bld: 143 mg/dL — ABNORMAL HIGH (ref 70–99)
Potassium: 3.6 mmol/L (ref 3.5–5.1)
Sodium: 139 mmol/L (ref 135–145)

## 2019-07-21 LAB — PROTIME-INR
INR: 3.7 — ABNORMAL HIGH (ref 0.8–1.2)
Prothrombin Time: 36.6 seconds — ABNORMAL HIGH (ref 11.4–15.2)

## 2019-07-21 NOTE — Progress Notes (Signed)
error 

## 2019-07-24 ENCOUNTER — Ambulatory Visit (HOSPITAL_COMMUNITY)
Admission: RE | Admit: 2019-07-24 | Discharge: 2019-07-24 | Disposition: A | Discharge status: Home / Self Care | Payer: Medicare Other | Source: Ambulatory Visit | Attending: Cardiology | Admitting: Cardiology

## 2019-07-24 ENCOUNTER — Other Ambulatory Visit: Payer: Self-pay

## 2019-07-24 DIAGNOSIS — D509 Iron deficiency anemia, unspecified: Secondary | ICD-10-CM

## 2019-07-24 MED ORDER — SODIUM CHLORIDE 0.9 % IV SOLN
510.0000 mg | INTRAVENOUS | Status: AC
  Administered 2019-07-24: 510 mg via INTRAVENOUS
  Filled 2019-07-24: qty 17

## 2019-07-27 ENCOUNTER — Other Ambulatory Visit: Payer: Self-pay | Admitting: Internal Medicine

## 2019-07-28 ENCOUNTER — Other Ambulatory Visit: Payer: Self-pay

## 2019-07-28 ENCOUNTER — Ambulatory Visit (HOSPITAL_COMMUNITY)
Admission: RE | Admit: 2019-07-28 | Discharge: 2019-07-28 | Disposition: A | Discharge status: Home / Self Care | Payer: Medicare Other | Source: Ambulatory Visit | Attending: Cardiology | Admitting: Cardiology

## 2019-07-28 ENCOUNTER — Encounter (HOSPITAL_COMMUNITY): Payer: Self-pay

## 2019-07-28 VITALS — BP 126/58 | HR 69 | Wt 247.4 lb

## 2019-07-28 DIAGNOSIS — Z8249 Family history of ischemic heart disease and other diseases of the circulatory system: Secondary | ICD-10-CM | POA: Insufficient documentation

## 2019-07-28 DIAGNOSIS — Z951 Presence of aortocoronary bypass graft: Secondary | ICD-10-CM | POA: Diagnosis not present

## 2019-07-28 DIAGNOSIS — I5032 Chronic diastolic (congestive) heart failure: Secondary | ICD-10-CM | POA: Diagnosis not present

## 2019-07-28 DIAGNOSIS — D696 Thrombocytopenia, unspecified: Secondary | ICD-10-CM | POA: Insufficient documentation

## 2019-07-28 DIAGNOSIS — E039 Hypothyroidism, unspecified: Secondary | ICD-10-CM | POA: Insufficient documentation

## 2019-07-28 DIAGNOSIS — Z881 Allergy status to other antibiotic agents status: Secondary | ICD-10-CM | POA: Diagnosis not present

## 2019-07-28 DIAGNOSIS — D649 Anemia, unspecified: Secondary | ICD-10-CM | POA: Diagnosis not present

## 2019-07-28 DIAGNOSIS — I5022 Chronic systolic (congestive) heart failure: Secondary | ICD-10-CM | POA: Insufficient documentation

## 2019-07-28 DIAGNOSIS — N1832 Chronic kidney disease, stage 3b: Secondary | ICD-10-CM | POA: Insufficient documentation

## 2019-07-28 DIAGNOSIS — G4733 Obstructive sleep apnea (adult) (pediatric): Secondary | ICD-10-CM | POA: Diagnosis not present

## 2019-07-28 DIAGNOSIS — I482 Chronic atrial fibrillation, unspecified: Secondary | ICD-10-CM | POA: Insufficient documentation

## 2019-07-28 DIAGNOSIS — E1122 Type 2 diabetes mellitus with diabetic chronic kidney disease: Secondary | ICD-10-CM | POA: Insufficient documentation

## 2019-07-28 DIAGNOSIS — J449 Chronic obstructive pulmonary disease, unspecified: Secondary | ICD-10-CM | POA: Insufficient documentation

## 2019-07-28 DIAGNOSIS — Z79899 Other long term (current) drug therapy: Secondary | ICD-10-CM | POA: Diagnosis not present

## 2019-07-28 DIAGNOSIS — Z87891 Personal history of nicotine dependence: Secondary | ICD-10-CM | POA: Insufficient documentation

## 2019-07-28 DIAGNOSIS — I2510 Atherosclerotic heart disease of native coronary artery without angina pectoris: Secondary | ICD-10-CM | POA: Insufficient documentation

## 2019-07-28 DIAGNOSIS — Z7901 Long term (current) use of anticoagulants: Secondary | ICD-10-CM | POA: Diagnosis not present

## 2019-07-28 DIAGNOSIS — Z952 Presence of prosthetic heart valve: Secondary | ICD-10-CM | POA: Diagnosis not present

## 2019-07-28 LAB — BASIC METABOLIC PANEL
Anion gap: 13 (ref 5–15)
BUN: 101 mg/dL — ABNORMAL HIGH (ref 8–23)
CO2: 27 mmol/L (ref 22–32)
Calcium: 8.7 mg/dL — ABNORMAL LOW (ref 8.9–10.3)
Chloride: 95 mmol/L — ABNORMAL LOW (ref 98–111)
Creatinine, Ser: 2.59 mg/dL — ABNORMAL HIGH (ref 0.61–1.24)
GFR calc Af Amer: 26 mL/min — ABNORMAL LOW (ref >60)
GFR calc non Af Amer: 22 mL/min — ABNORMAL LOW (ref >60)
Glucose, Bld: 133 mg/dL — ABNORMAL HIGH (ref 70–99)
Potassium: 3.4 mmol/L — ABNORMAL LOW (ref 3.5–5.1)
Sodium: 135 mmol/L (ref 135–145)

## 2019-07-28 LAB — PROTIME-INR
INR: 3.2 — ABNORMAL HIGH (ref 0.8–1.2)
Prothrombin Time: 32.8 seconds — ABNORMAL HIGH (ref 11.4–15.2)

## 2019-07-28 LAB — CBC
HCT: 28.5 % — ABNORMAL LOW (ref 39.0–52.0)
Hemoglobin: 8.9 g/dL — ABNORMAL LOW (ref 13.0–17.0)
MCH: 37.2 pg — ABNORMAL HIGH (ref 26.0–34.0)
MCHC: 31.2 g/dL (ref 30.0–36.0)
MCV: 119.2 fL — ABNORMAL HIGH (ref 80.0–100.0)
Platelets: 60 10*3/uL — ABNORMAL LOW (ref 150–400)
RBC: 2.39 MIL/uL — ABNORMAL LOW (ref 4.22–5.81)
RDW: 18.2 % — ABNORMAL HIGH (ref 11.5–15.5)
WBC: 2.8 10*3/uL — ABNORMAL LOW (ref 4.0–10.5)
nRBC: 0 % (ref 0.0–0.2)

## 2019-07-28 NOTE — Progress Notes (Signed)
ReDS Vest / Clip - 07/28/19 1100      ReDS Vest / Clip   Station Marker  D    Ruler Value  34    ReDS Value Range  Low volume    ReDS Actual Value  34

## 2019-07-28 NOTE — Progress Notes (Signed)
Advanced Heart Failure Clinic Note   PCP: [name], [name], MD PCP-Cardiologist: [name], MD  HF: [name]  HPI: [name] is a 84 y.o. male with h/o mechanical AVR 1994, OSA on CPAP, Chronic afib, chronic combined CHF, CKD III, chronic coumadin therapy, CAD s/p CAGB 1994, and COPD.   Admitted 7/18-11/05/17 from [facility] office with A/C diastolic HF. Diuresed 83 lbs with lasix drip, metolazone, and diamox. Transitioned to torsemide 80 mg BID. Determined not to be a candidate for MitraClip. Dr [name] also consulted and recommended aggressive medical therapy versus  percutaneous MVR trial as he felt very high risk for conventional surgery. Course complicated by drop in hemoglobin and symptomatic hypotension, requiring 1 unit pRBCs. GI consulted and did not recommend scoping. BB and spiro were stopped due to low BPs. Had some AKI assoicated with low BP, but resolved by discharge. Required lovenox bridge at DC with subtherapeutic INR. Discharged with [facility]. DC weight: 216 lbs.   Admitted 9/11 - 12/14/17 for dental extractions in setting of work up for MVR. Hospital course complicated by run of VT in the setting of hypokalemia. Started on amiodarone with h/o of arrhythmia. Bridged with lovenox.   Admitted 9/22 - 12/28/17 with symptomatic anemia from bleeding gums. Bleeding stopped with holding heparin and coumadin. Received multiple units of blood.   He was admitted again in 10/19 with left thigh hematoma and went from there to inpatient rehab.   He has been evaluated in [place] for a transcatheter mitral valve.  RHC/LHC was done in 1/20 as part of his pre-op evaluation.  Filling pressures and cardiac output were well-compensated, he had patent SVG-PDA.  Echo in 1/20 showed EF 40-45%, moderate RV dilation with normal systolic function, severe MR with likely rheumatic mitral valve, normal St Jude mechanical aortic valve. Unfortunately, it was determined that he would not qualify for any of  the percutaneous mitral valve studies.    Most recent echo 07/2019 showed EF 50-55% with moderate LV dilation, severe MR, mildly decreased RV systolic function, dilated IVC, stable mechanical aortic valve.   His diuretic doses have been up and down recently based on volume retention and creatinine.   He was recently seen by Dr. [name] on 4/12 and was volume overloaded. Torsemide was continued at 100 mg bid an metolazone was increased to 2.5 mg 3 days a week.   He returns today to reassess volume status and for repeat labs. His wt is up 5 lb from last visit but has not yet taken his metolazone for the day (takes MWF). Continues w/ bilateral LEE but c/w his usual baseline, per his report. Functional status is the same, NYHA  II-III. Not any worse. He reports "decent" urinary response to diuretics and has been avoiding sodium. He reports that he is followed by [facility] and they are scheduled for a visit tomorrow. ReDs Clip measurement today is 34%     Labs (10/19): hgb 9.2 Labs (11/19): K 3.2, creatinine 1.31 Labs (1/20): K 4.5, creatinine 1.38 Labs (2/20): LDL 64 Labs (6/20): K 4.1, creatinine 1.8, hgb 12.9 Labs (1/21): LDL 64 Labs (2/21): K 4, creatinine 2.9 => 2.47 Labs (3/21): K 4.9, creatinine 3 => 2.92, LFTs normal, elevated TSH with normal free T3 and T4  ROS:  All systems reviewed and negative except as per HPI.    PMH: 1. Carotid stenosis: S/p left CEA in 1994.  2. Atrial fibrillation: Chronic.  He is on warfarin with mechanical aortic valve.  3. Type II diabetes.  4. COPD: PFTs (11/19) with moderate obstructive airways disease and severely decreased DLCO.  5. OSA: Uses CPAP.  6. 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  7. ITP 8. VT: On amiodarone.  9. CKD: Stage 3.  10. 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  11. Chronic systolic CHF: TEE (1/44) with EF 55-60%,  D-shaped septum, mildly dilated RV with mildly decreased systolic function, mechanical aortic valve, rheumatic-appearing mitral valve with severe MR, minimal stenosis.  - RHC (1/20): mean RA 6, PA 54/20, mean PCWP 15, CI 3.42, PVR 2.1 WU, PAPI 5.2 - Echo (1/20): EF 40-45%, moderate RV dilation with normal systolic function, severe biatrial enlargement, severe rheumatic MR, St Jude mechanical aortic valve functioning normally.  - Echo (3/21): EF 50-55% with moderate LV dilation, severe MR, mildly decreased RV systolic function, dilated IVC, stable mechanical aortic valve.  12. ABIs (12/18): Normal.  13. CAD: S/p CABG 1994.  - LHC (1/20): 50% D1, 60% ostial LCx, totally occluded RCA with patent SVG-PDA.     Current Outpatient Medications  Medication Sig Dispense Refill  . acetaminophen (TYLENOL) 500 MG tablet Take 1,000 mg by mouth every 6 (six) hours as needed for moderate pain or headache.    . albuterol (PROAIR HFA) 108 (90 Base) MCG/ACT inhaler Inhale 2 puffs into the lungs every 4 (four) hours as needed for wheezing or shortness of breath. 18 g 5  . allopurinol (ZYLOPRIM) 100 MG tablet Take 1 tablet daily 30 tablet 6  . amiodarone (PACERONE) 200 MG tablet Take 1 tablet (200 mg total) by mouth daily. 90 tablet 3  . carvedilol (COREG) 3.125 MG tablet Take 1 tablet (3.125 mg total) by mouth 2 (two) times daily. 60 tablet 6  . ezetimibe-simvastatin (VYTORIN) 10-20 MG tablet TAKE 1 TABLET BY MOUTH EVERYDAY AT BEDTIME 90 tablet 3  . finasteride (PROSCAR) 5 MG tablet Take 5 mg by mouth daily.    Marland Kitchen levothyroxine (SYNTHROID) 50 MCG tablet TAKE 1 TABLET BY MOUTH DAILY BEFORE BREAKFAST 90 tablet 3  . metolazone (ZAROXOLYN) 2.5 MG tablet Take 1 tablet (2.5 mg total) by mouth 3 (three) times a week. On mondays,wednesdays and fridays 38 tablet 3  . polyethylene glycol powder (GLYCOLAX/MIRALAX) powder Take 17 g by mouth daily as needed for moderate constipation.    . potassium chloride SA (KLOR-CON) 20 MEQ  tablet Take 60 meq (3 tabs) in AM, 40 meq (2 tabs) at noon and 40 meq (2 tabs) in PM    . tamsulosin (FLOMAX) 0.4 MG CAPS capsule Take 0.4 mg by mouth daily after supper.     . torsemide (DEMADEX) 100 MG tablet Take 1 tablet (100 mg total) by mouth 2 (two) times daily. 180 tablet 3  . triamcinolone ointment (KENALOG) 0.1 % Apply 1 application topically 2 (two) times daily. (Patient taking differently: Apply 1 application topically daily as needed. ) 80 g 1  . warfarin (COUMADIN) 5 MG tablet TAKE 1 DAILY EXCEPT 1.5 TABLETS ON THURS OR AS DIRECTED BY COUMADIN CLINIC. 100 tablet 0   No current facility-administered medications for this encounter.   Allergies  Allergen Reactions  . Rifampin Rash and Other (See Comments)    May have been caused by Vancomycin or Rifampin (??)  . Vancomycin Rash and Other (See Comments)    May have been caused by Vancomycin or Rifampin (??)   Social History   Socioeconomic  History  . Marital status: Married    Spouse name: Not on file  . Number of children: 3  . Years of education: Not on file  . Highest education level: Not on file  Occupational History  . Not on file  Tobacco Use  . Smoking status: Former Smoker    Packs/day: 1.00    Years: 30.00    Pack years: 30.00    Types: Cigarettes    Quit date: 04/03/1992    Years since quitting: 27.3  . Smokeless tobacco: Never Used  Substance and Sexual Activity  . Alcohol use: Yes    Comment: 4-6 ounces of wine daily  . Drug use: No    Comment: Half a cup a day.  Marland Kitchen Sexual activity: Not on file  Other Topics Concern  . Not on file  Social History Narrative   Patient is married and lives with his wife.   Youngest daughter lives in [place] also.   Patient has 2 other children.   Social Determinants of Health   Financial Resource Strain:   . Difficulty of Paying Living Expenses:   Food Insecurity:   . Worried About Charity fundraiser in the Last Year:   . Arboriculturist in the Last Year:     Transportation Needs:   . Film/video editor (Medical):   Marland Kitchen Lack of Transportation (Non-Medical):   Physical Activity:   . Days of Exercise per Week:   . Minutes of Exercise per Session:   Stress:   . Feeling of Stress :   Social Connections:   . Frequency of Communication with Friends and Family:   . Frequency of Social Gatherings with Friends and Family:   . Attends Religious Services:   . Active Member of Clubs or Organizations:   . Attends Archivist Meetings:   Marland Kitchen Marital Status:   Intimate Partner Violence:   . Fear of Current or Ex-Partner:   . Emotionally Abused:   Marland Kitchen Physically Abused:   . Sexually Abused:     Family History  Problem Relation Age of Onset  . Cancer Father        lung   . Hypertension Mother    Vitals:   07/28/19 1106  BP: (!) 126/58  Pulse: 69  SpO2: 93%  Weight: 112.2 kg (247 lb 6 oz)     Wt Readings from Last 3 Encounters:  07/28/19 112.2 kg (247 lb 6 oz)  07/14/19 109.8 kg (242 lb)  06/23/19 110.2 kg (243 lb)   PHYSICAL EXAM: General:  Well appearing, elderly WM. No respiratory difficulty HEENT: normal Neck: supple. no JVD. Carotids 2+ bilat; no bruits. No lymphadenopathy or thyromegaly appreciated. Cor: PMI nondisplaced. Irregularly irregular rhythm, regular rate. Crisp mechanical AV sounds. 3/6 MR murmur loudest at apex Lungs: clear. No wheezing, rhonchi nor rales  Abdomen: mildly obese but soft, nontender, nondistended. No hepatosplenomegaly. No bruits or masses. Good bowel sounds. Extremities: no cyanosis, clubbing, rash, 2+ bilateral LE edema Neuro: alert & oriented x 3, cranial nerves grossly intact. moves all 4 extremities w/o difficulty. Affect pleasant.   ASSESSMENT & PLAN: 1. Chronic systolic CHF with prominent RV failure:  Echo in 3/21 with EF 50-55%, mildly decreased RV systolic function, severe rheumatic mitral regurgitation, normal mechanical aortic valve.  Severe MR likely plays a significant role in CHF and  RV failure>>volume management has been challenging.  NYHA class III, which is stable.  He is volume overloaded on exam with R> L  sided HF. ReDs Clip measurement 34% today  - Continue torsemide 100 mg bid.  - Continue metolazone 2.5 mg 3 days a week  - He reports decent urinary response to diuretics, but I think he would have improved response if we can push fluid back to intravascular space. Will ask [facility] to place UNNA boots at their visit tomorrow to help w/ LEE/ diuresis  - He is off spironolactone with elevated creatinine.  - Continue Coreg 3.125 mg bid.  - Check BMP today to ensure SCr and K stable w/ metolazone increase  2. Mechanical aortic valve: Appeared to function well on last echo.  - Continue warfarin with INR goal 2.5-3.5 (also with atrial fibrillation).  - He is off aspirin with spontaneous thigh hematoma.   - He denies any recent abnormal bleeding. Check CBC today  3. Atrial fibrillation: Chronic, rate is controlled.  - on coumadin for a/c  4. CAD: s/p CABG.  No chest pain.  Cath in 1/20 with patent SVG-PDA, no interventional target.  - No ASA given stable CAD with warfarin use.   - Continue Vytorin, good lipids in 1/21.  5. COPD: He is no longer using oxygen during the day.  Moderate COPD on 11/19 PFTs.   6. OSA: He admits to poor compliance w/ BiPAP. Had temporarily discontinued due to frequent nosebleeds, which have resolve. He plans to restart nightly use.  7.CKD Stage 3b:  - Check BP today  8. Thrombocytopenia: Chronic, has been mild/stable.  CBC today.  9. Severe MR: TEE 7/19 with severe MR with possible rheumatic MV (do not think MS is significant, mildly elevated mean gradient from high flow with severe MR).  Structural Heart team evaluated, not a Mitraclip candidate. Seen by Dr. [name], would be high risk for open surgery.  He was evaluated at [facility] in [place] for percutaneous mitral valve trials.  Unfortunately, he does not qualify for any of the available trials.   We will have to continue medical management.  10. VT: He is on amiodarone to suppress VT.  -LFTs WNL 07/14/19.  Hypothyroidism followed by PCP.  Will need regular eye exam on amiodarone.  11. Anemia: Denies any recent bleeding  - recently started IV iron infusions.  - Repeat CBC today.   Check BMP today given recent diuretic increase. F/u in 6-8 weeks w/ Dr. [name]. Sooner if needed.    Lyda Jester, PA-C 07/28/2019

## 2019-07-28 NOTE — Patient Instructions (Signed)
Labs done today, your results will be available in MyChart, we will contact you for abnormal readings.  Will have [facility] wrap your legs tomorrow  Your physician recommends that you schedule a follow-up appointment in: 2-3 months  If you have any questions or concerns before your next appointment please send [place] a message through [place] or call our office at [phone].  At the [facility], you and your health needs are our priority. As part of our continuing mission to provide you with exceptional heart care, we have created designated Provider Care Teams. These Care Teams include your primary Cardiologist (physician) and Advanced Practice Providers (APPs- Physician Assistants and Nurse Practitioners) who all work together to provide you with the care you need, when you need it.   You may see any of the following providers on your designated Care Team at your next follow up: Marland Kitchen Dr Glori Bickers . Dr Loralie Champagne . Darrick Grinder, NP . Lyda Jester, PA . Audry Riles, PharmD   Please be sure to bring in all your medications bottles to every appointment.

## 2019-07-29 ENCOUNTER — Telehealth: Payer: Self-pay | Admitting: Pharmacist

## 2019-07-29 ENCOUNTER — Ambulatory Visit (INDEPENDENT_AMBULATORY_CARE_PROVIDER_SITE_OTHER): Payer: Medicare Other | Admitting: Pharmacist

## 2019-07-29 ENCOUNTER — Telehealth (HOSPITAL_COMMUNITY): Payer: Self-pay | Admitting: Cardiology

## 2019-07-29 DIAGNOSIS — I482 Chronic atrial fibrillation, unspecified: Secondary | ICD-10-CM | POA: Diagnosis not present

## 2019-07-29 DIAGNOSIS — Z952 Presence of prosthetic heart valve: Secondary | ICD-10-CM | POA: Diagnosis not present

## 2019-07-29 DIAGNOSIS — I5032 Chronic diastolic (congestive) heart failure: Secondary | ICD-10-CM

## 2019-07-29 MED ORDER — METOLAZONE 2.5 MG PO TABS: 2.5000 mg | ORAL_TABLET | ORAL | 3 refills | Status: DC

## 2019-07-29 NOTE — Telephone Encounter (Signed)
-----   Message from Consuelo Pandy, [place] sent at 07/28/2019  9:51 PM EDT ----- Platelet count is very low at 60K. Check to see if he has noticed any abnormal bleeding and place STAT consult to hematology for pancytopenia   He has had significant bump in BUN w/ metolazone increase. Recommend reducing metolazone back to 2 days a week. Have [facility] wrap legs in unna boots to help with edema.  Repeat BMP in 1 week.

## 2019-07-29 NOTE — Telephone Encounter (Signed)
Pt aware and voiced understanding  Reports he has seen hematology in the past and would like to follow up with Dr Nicoletta Dress, aware of medication changes and message for wraps faxed to [facility] Cecille Rubin [phone]) fax [phone]

## 2019-07-29 NOTE — Telephone Encounter (Signed)
[facility] Visit Follow Up Request   Date of Request ([place]):  July 29, 2019  Requesting Provider:  Daneen Schick  Agency Requested:    [facility] Contact:  Glory Buff, NP [address] [place], [place] [address] Phone #:  [phone] Fax #:  [phone]  Patient Demographic Information: Name:  [name] Age:  84 y.o.   DOB:  1935-06-19  MRN:  [id number]   Home visit progress note(s), lab results, telemetry strips, etc were reviewed.  Provider Recommendations: INR on 5/10 or 5/11  Follow up home services requested:  Labs:  INR  All labs ordered for this home visit have been released and the request was sent to [name] at [facility].

## 2019-07-30 DIAGNOSIS — I50812 Chronic right heart failure: Secondary | ICD-10-CM | POA: Diagnosis not present

## 2019-07-30 DIAGNOSIS — I5032 Chronic diastolic (congestive) heart failure: Secondary | ICD-10-CM | POA: Diagnosis not present

## 2019-07-31 ENCOUNTER — Telehealth (HOSPITAL_COMMUNITY): Payer: Self-pay | Admitting: *Deleted

## 2019-07-31 NOTE — Telephone Encounter (Signed)
NP with [facility] called to report pts weight is up 2lbs from office visit. Pts weight was up 4lbs during his office visit on 4/26. Pt creatinine and bun also elevated from 4/26.  Creat- 2.59 BUN- 101  Per Brittainy Simmons,PA take 2.5 metolazone with IV lasix tomorrow 4/30. Pt should have two days of 80 IV lasix BID (4/30 and 5/1)  and hold torsemide. Pt can resume torsemide 100mg  bid on Sunday 5/2. NP aware and agreeable with plan.   [facility] NP call back [phone]

## 2019-08-01 ENCOUNTER — Telehealth: Payer: Self-pay | Admitting: Hematology

## 2019-08-01 DIAGNOSIS — I5032 Chronic diastolic (congestive) heart failure: Secondary | ICD-10-CM | POA: Diagnosis not present

## 2019-08-01 NOTE — Telephone Encounter (Signed)
Scheduled appt per 4/30 sch message - pt aware of appt date and time

## 2019-08-02 ENCOUNTER — Other Ambulatory Visit (HOSPITAL_COMMUNITY): Payer: Self-pay | Admitting: Cardiology

## 2019-08-02 DIAGNOSIS — I5032 Chronic diastolic (congestive) heart failure: Secondary | ICD-10-CM | POA: Diagnosis not present

## 2019-08-02 DIAGNOSIS — I50812 Chronic right heart failure: Secondary | ICD-10-CM | POA: Diagnosis not present

## 2019-08-05 DIAGNOSIS — I5032 Chronic diastolic (congestive) heart failure: Secondary | ICD-10-CM | POA: Diagnosis not present

## 2019-08-06 ENCOUNTER — Telehealth (HOSPITAL_COMMUNITY): Payer: Self-pay | Admitting: *Deleted

## 2019-08-06 ENCOUNTER — Other Ambulatory Visit (HOSPITAL_COMMUNITY): Payer: Self-pay | Admitting: Cardiology

## 2019-08-06 NOTE — Telephone Encounter (Signed)
Pt left VM stating he picked up medications from the pharmacy and Spironolactone was one of the medications. Pt said he has not taken spironolactone in a long time and was wondering if he needed to restart it. I looked at patients chart and it doesn't look like spironolactone was filled by our providers. It isn't on his medication list in epic. I called pt to find out the name of the doctor on the pill bottle who prescribed the medication. No answer/left VM for pt to return my call.

## 2019-08-07 ENCOUNTER — Telehealth (HOSPITAL_COMMUNITY): Payer: Self-pay | Admitting: Cardiology

## 2019-08-07 NOTE — Telephone Encounter (Signed)
Abnormal labs received from [place] drawn 08/01/19 K 5.6  Per VO Amy Clegg,NP Decrease to KCL 40 meq BID  Pt aware and voiced understanding Reports he was given a bottle of spiro from pharmacy Patient is unsure if he is supposed to take this mediation Reports it may have been started by [facility] provider  Advised will look into this further and return call

## 2019-08-12 ENCOUNTER — Encounter: Payer: Self-pay | Admitting: Nurse Practitioner

## 2019-08-12 DIAGNOSIS — I70219 Atherosclerosis of native arteries of extremities with intermittent claudication, unspecified extremity: Secondary | ICD-10-CM | POA: Diagnosis not present

## 2019-08-12 DIAGNOSIS — Z7901 Long term (current) use of anticoagulants: Secondary | ICD-10-CM | POA: Diagnosis not present

## 2019-08-12 DIAGNOSIS — I50812 Chronic right heart failure: Secondary | ICD-10-CM | POA: Diagnosis not present

## 2019-08-12 DIAGNOSIS — I5032 Chronic diastolic (congestive) heart failure: Secondary | ICD-10-CM | POA: Diagnosis not present

## 2019-08-12 LAB — PROTIME-INR: INR: 4.1 — AB (ref <=1.1)

## 2019-08-13 ENCOUNTER — Telehealth: Payer: Self-pay | Admitting: *Deleted

## 2019-08-13 ENCOUNTER — Ambulatory Visit (INDEPENDENT_AMBULATORY_CARE_PROVIDER_SITE_OTHER): Payer: Medicare Other | Admitting: Internal Medicine

## 2019-08-13 DIAGNOSIS — Z5181 Encounter for therapeutic drug level monitoring: Secondary | ICD-10-CM

## 2019-08-13 NOTE — Telephone Encounter (Signed)
Pt in the coumadin clinic follow up book to get INR checked on 5/11. Called and spoke to pt  And he stated that a nurse named  Ebony Hail from [facility] came out and drew some blood.   Called Ebony Hail from [facility] at [phone]), She stated that she did draw pt/inr on Tuesday. Requested that she fax the results to coumadin clinic at [phone], she stated that she would.    Will wait on INR results to give pt further dosing instructions.

## 2019-08-13 NOTE — Patient Instructions (Signed)
Description   Spoke to pt and instructed for pt to hold warfarin today and then to continue to take 1 tablet daily except for 1/2 a tablet on Sunday, Tuesday and Thursday. Call coumadin clinic [phone] for any changes in medications or upcoming procedures.  ([facility] goes out weekly, will place order for them to check)

## 2019-08-13 NOTE — Progress Notes (Signed)
Spoke to [place] from [facility], [phone], and she stated that they will be going out there on 5/18. Gave order for her to check INR while she was out there and to fax results [facility]in Clinic [phone].

## 2019-08-19 ENCOUNTER — Other Ambulatory Visit: Payer: Self-pay

## 2019-08-19 ENCOUNTER — Other Ambulatory Visit (HOSPITAL_COMMUNITY): Payer: Self-pay | Admitting: Cardiology

## 2019-08-19 ENCOUNTER — Inpatient Hospital Stay: Payer: Medicare Other | Admitting: Hematology

## 2019-08-19 DIAGNOSIS — I5032 Chronic diastolic (congestive) heart failure: Secondary | ICD-10-CM | POA: Diagnosis not present

## 2019-08-19 DIAGNOSIS — I4891 Unspecified atrial fibrillation: Secondary | ICD-10-CM | POA: Diagnosis not present

## 2019-08-19 DIAGNOSIS — I50812 Chronic right heart failure: Secondary | ICD-10-CM | POA: Diagnosis not present

## 2019-08-19 LAB — PROTIME-INR: INR: 2.5 — AB (ref 0.9–1.1)

## 2019-08-20 ENCOUNTER — Inpatient Hospital Stay: Payer: Medicare Other | Attending: Hematology | Admitting: Hematology

## 2019-08-20 ENCOUNTER — Telehealth: Payer: Self-pay | Admitting: Hematology

## 2019-08-20 ENCOUNTER — Inpatient Hospital Stay: Payer: Medicare Other

## 2019-08-20 ENCOUNTER — Other Ambulatory Visit: Payer: Self-pay

## 2019-08-20 VITALS — BP 109/57 | HR 62 | Temp 98.3°F | Resp 18 | Ht 74.0 in | Wt 236.8 lb

## 2019-08-20 DIAGNOSIS — D696 Thrombocytopenia, unspecified: Secondary | ICD-10-CM | POA: Diagnosis not present

## 2019-08-20 DIAGNOSIS — D72819 Decreased white blood cell count, unspecified: Secondary | ICD-10-CM

## 2019-08-20 DIAGNOSIS — D649 Anemia, unspecified: Secondary | ICD-10-CM | POA: Diagnosis not present

## 2019-08-20 DIAGNOSIS — N189 Chronic kidney disease, unspecified: Secondary | ICD-10-CM | POA: Insufficient documentation

## 2019-08-20 DIAGNOSIS — D539 Nutritional anemia, unspecified: Secondary | ICD-10-CM | POA: Insufficient documentation

## 2019-08-20 LAB — IRON AND TIBC
Iron: 84 ug/dL (ref 42–163)
Saturation Ratios: 25 % (ref 20–55)
TIBC: 335 ug/dL (ref 202–409)
UIBC: 252 ug/dL (ref 117–376)

## 2019-08-20 LAB — CMP (CANCER CENTER ONLY)
ALT: 22 U/L (ref 0–44)
AST: 29 U/L (ref 15–41)
Albumin: 3.9 g/dL (ref 3.5–5.0)
Alkaline Phosphatase: 142 U/L — ABNORMAL HIGH (ref 38–126)
Anion gap: 15 (ref 5–15)
BUN: 78 mg/dL — ABNORMAL HIGH (ref 8–23)
CO2: 26 mmol/L (ref 22–32)
Calcium: 9.2 mg/dL (ref 8.9–10.3)
Chloride: 96 mmol/L — ABNORMAL LOW (ref 98–111)
Creatinine: 2.4 mg/dL — ABNORMAL HIGH (ref 0.61–1.24)
GFR, Est AFR Am: 28 mL/min — ABNORMAL LOW (ref >60)
GFR, Estimated: 24 mL/min — ABNORMAL LOW (ref >60)
Glucose, Bld: 111 mg/dL — ABNORMAL HIGH (ref 70–99)
Potassium: 4 mmol/L (ref 3.5–5.1)
Sodium: 137 mmol/L (ref 135–145)
Total Bilirubin: 1.4 mg/dL — ABNORMAL HIGH (ref 0.3–1.2)
Total Protein: 7.4 g/dL (ref 6.5–8.1)

## 2019-08-20 LAB — CBC WITH DIFFERENTIAL/PLATELET
Abs Immature Granulocytes: 0.02 10*3/uL (ref 0.00–0.07)
Basophils Absolute: 0 10*3/uL (ref 0.0–0.1)
Basophils Relative: 0 %
Eosinophils Absolute: 0 10*3/uL (ref 0.0–0.5)
Eosinophils Relative: 1 %
HCT: 30.5 % — ABNORMAL LOW (ref 39.0–52.0)
Hemoglobin: 10 g/dL — ABNORMAL LOW (ref 13.0–17.0)
Immature Granulocytes: 1 %
Lymphocytes Relative: 13 %
Lymphs Abs: 0.4 10*3/uL — ABNORMAL LOW (ref 0.7–4.0)
MCH: 38.2 pg — ABNORMAL HIGH (ref 26.0–34.0)
MCHC: 32.8 g/dL (ref 30.0–36.0)
MCV: 116.4 fL — ABNORMAL HIGH (ref 80.0–100.0)
Monocytes Absolute: 0.3 10*3/uL (ref 0.1–1.0)
Monocytes Relative: 10 %
Neutro Abs: 2.5 10*3/uL (ref 1.7–7.7)
Neutrophils Relative %: 75 %
Platelets: 53 10*3/uL — ABNORMAL LOW (ref 150–400)
RBC: 2.62 MIL/uL — ABNORMAL LOW (ref 4.22–5.81)
RDW: 18 % — ABNORMAL HIGH (ref 11.5–15.5)
WBC: 3.2 10*3/uL — ABNORMAL LOW (ref 4.0–10.5)
nRBC: 0 % (ref 0.0–0.2)

## 2019-08-20 LAB — FERRITIN: Ferritin: 319 ng/mL (ref 24–336)

## 2019-08-20 LAB — VITAMIN B12: Vitamin B-12: 454 pg/mL (ref 180–914)

## 2019-08-20 LAB — LACTATE DEHYDROGENASE: LDH: 285 U/L — ABNORMAL HIGH (ref 98–192)

## 2019-08-20 NOTE — Telephone Encounter (Signed)
Scheduled per 5/19 los. Printed AVS and calendar for pt.

## 2019-08-20 NOTE — Progress Notes (Signed)
[place]   Hematology Clinic Note  Date of service: 08/01/17   Patient Care Team: Cassandria Anger, MD as PCP - General (Internal Medicine) Belva Crome, MD as PCP - Cardiology (Cardiology) Sueanne Margarita, MD as PCP - Sleep Medicine (Sleep Medicine) Brunetta Genera, MD as Consulting Physician (Hematology) Belva Crome, MD as Consulting Physician (Cardiology) Elayne Snare, MD as Consulting Physician (Endocrinology)  CHIEF COMPLAINTS: follow-up for Macrocytosis and Thrombocytopenia.  HISTORY OF PRESENTING ILLNESS: Please see my initial note for details on initial presentation.  Interval history:  [name] is here for follow-up regarding his thrombocytopenia. We are joined today by his daughter [name]. The patient's last visit with [place] was on 08/01/2017. The pt reports that he is doing well overall.  The pt reports that he has been working with Cardiology to control his fluid retention. It is currently being controlled with medications and he was recently given Lasix IV for at home use. Pt discontinued taking Vitamin O17 and Folic acid supplements some time ago. Pt's thyroid functions and medications have remained steady. He has continued using Allopurinol to prevent Gout flares.   He was given two IV Feraheme infusions over the last few months by Dr. [name]. Pt's INR levels have not been steady while on Coumadin. They have gone as high as 10. Pt has had some bruising and some nose bleeds. Pt has had a nasal cauterization, but notes that his nose still bleeds occasionally. He denies any bloody/black stools. Pt has been eating well, and is working on improving his mobility around the home. He has a follow-up with a Dermatologist on 05/25 to evaluate several new skin lesions.   In September of 2019 he had several of his teeth pulled and bleed profusely as he was on Coumadin and Lovenox  Pt also tore his vastus lateralis after stepping into the bathtub in October  of 2019.   Lab results today (08/01/2019) of CBC w/diff and CMP is as follows: all values are WNL except for WBC at 3.2K, RBC at 2.53, Hgb at 9.5, HCT at 28.2, MCV at 112, MCH at 37.5, RDW at 17.5, Lymphs Abs at 0.4K. 08/12/2019 BMP shows all values are WNL except for: Glucose at 123, BUN at 97, Creatinine at 2.28, GFR Est Non Af Am at 26, BUN/Creatinine Ratio at 43, Chloride at 95, CO2 at 31  On review of systems, pt reports leg swelling and denies low appetite, back pain, abdominal pain, unexpected weight loss, bloody/black stools, gum bleeds and any other symptoms.   MEDICAL HISTORY:  Past Medical History:  Diagnosis Date  . Carotid artery disease ([place]) 1994   s/p left carotid endarerectomy   . Chronic atrial fibrillation (HCC)    a. on coumadin   . Chronic diastolic CHF (congestive heart failure) ([place])   . Diabetes mellitus without complication ([place])    dx 2016  . Dyspnea   . Epistaxis   . Heart murmur   . Hx of CABG    a. 1994  . Hypertension   . OSA (obstructive sleep apnea)   . Rheumatic fever   . S/P AVR (aortic valve replacement)    a. mechanical valve 1996  . Subclavian bypass stenosis ([place])   . 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 Vitamin B12 deficiency     SURGICAL HISTORY: Past Surgical History:  Procedure Laterality Date  . artificial valve    .  [place]   replaced due to aortic stenosis, [employer] mechanical prostesis  . CAROTID ENDARTERECTOMY Left 1997   subclavian bypass Done in [place]  . CORONARY ARTERY BYPASS GRAFT  1994   w SVG to RCA and SVG to circumflex  . heart bypass     Done in [place]  . MULTIPLE EXTRACTIONS WITH ALVEOLOPLASTY Bilateral 12/12/2017   Procedure: Extraction of tooth #'s 1, 12,13,14,15,17,18,19, 29, and 30 with alveoloplasty and gross debridement of remaining teeth`;  Surgeon: Lenn Cal, DDS;  Location: [facility];  Service: Oral Surgery;   Laterality: Bilateral;  MULTIPLE EXTRACTION WITH ALVEOLOPLASTY WITH PRE PROSTHETIC SURGERY AND GROSS DEBRIDEMENT OF REMAINING TEETH  . RIGHT HEART CATH AND CORONARY/GRAFT ANGIOGRAPHY N/A 04/30/2018   Procedure: RIGHT HEART CATH AND CORONARY/GRAFT ANGIOGRAPHY;  Surgeon: Larey Dresser, MD;  Location: [facility];  Service: Cardiovascular;  Laterality: N/A;  . TEE WITHOUT CARDIOVERSION N/A 10/23/2017   Procedure: TRANSESOPHAGEAL ECHOCARDIOGRAM (TEE);  Surgeon: Larey Dresser, MD;  Location: [facility] ENDOSCOPY;  Service: Cardiovascular;  Laterality: N/A;  . TONSILLECTOMY      SOCIAL HISTORY: Social History   Socioeconomic History  . Marital status: Married    Spouse name: Not on file  . Number of children: 3  . Years of education: Not on file  . Highest education level: Not on file  Occupational History  . Not on file  Tobacco Use  . Smoking status: Former Smoker    Packs/day: 1.00    Years: 30.00    Pack years: 30.00    Types: Cigarettes    Quit date: 04/03/1992    Years since quitting: 27.3  . Smokeless tobacco: Never Used  Substance and Sexual Activity  . Alcohol use: Yes    Comment: 4-6 ounces of wine daily  . Drug use: No    Comment: Half a cup a day.  Marland Kitchen Sexual activity: Not on file  Other Topics Concern  . Not on file  Social History Narrative   Patient is married and lives with his wife.   Youngest daughter lives in [place] also.   Patient has 2 other children.   Social Determinants of Health   Financial Resource Strain:   . Difficulty of Paying Living Expenses:   Food Insecurity:   . Worried About Charity fundraiser in the Last Year:   . Arboriculturist in the Last Year:   Transportation Needs:   . Film/video editor (Medical):   Marland Kitchen Lack of Transportation (Non-Medical):   Physical Activity:   . Days of Exercise per Week:   . Minutes of Exercise per Session:   Stress:   . Feeling of Stress :   Social Connections:   . Frequency of Communication with  Friends and Family:   . Frequency of Social Gatherings with Friends and Family:   . Attends Religious Services:   . Active Member of Clubs or Organizations:   . Attends Archivist Meetings:   Marland Kitchen Marital Status:   Intimate Partner Violence:   . Fear of Current or Ex-Partner:   . Emotionally Abused:   Marland Kitchen Physically Abused:   . Sexually Abused:     FAMILY HISTORY: Family History  Problem Relation Age of Onset  . Cancer Father        lung   . Hypertension Mother     ALLERGIES:  is allergic to rifampin and vancomycin.  MEDICATIONS:  Current Outpatient Medications  Medication Sig  Dispense Refill  . acetaminophen (TYLENOL) 500 MG tablet Take 1,000 mg by mouth every 6 (six) hours as needed for moderate pain or headache.    . albuterol (PROAIR HFA) 108 (90 Base) MCG/ACT inhaler Inhale 2 puffs into the lungs every 4 (four) hours as needed for wheezing or shortness of breath. 18 g 5  . allopurinol (ZYLOPRIM) 100 MG tablet Take 1 tablet daily 30 tablet 6  . amiodarone (PACERONE) 200 MG tablet Take 1 tablet (200 mg total) by mouth daily. 90 tablet 3  . carvedilol (COREG) 3.125 MG tablet Take 1 tablet (3.125 mg total) by mouth 2 (two) times daily. 180 tablet 3  . ezetimibe-simvastatin (VYTORIN) 10-20 MG tablet TAKE 1 TABLET BY MOUTH EVERYDAY AT BEDTIME 90 tablet 3  . finasteride (PROSCAR) 5 MG tablet Take 5 mg by mouth daily.    Marland Kitchen levothyroxine (SYNTHROID) 50 MCG tablet TAKE 1 TABLET BY MOUTH DAILY BEFORE BREAKFAST 90 tablet 3  . metolazone (ZAROXOLYN) 2.5 MG tablet Take 1 tablet (2.5 mg total) by mouth 2 (two) times a week. 38 tablet 3  . polyethylene glycol powder (GLYCOLAX/MIRALAX) powder Take 17 g by mouth daily as needed for moderate constipation.    . potassium chloride SA (KLOR-CON) 20 MEQ tablet Take 60 meq (3 tabs) in AM, 40 meq (2 tabs) at noon and 40 meq (2 tabs) in PM    . spironolactone (ALDACTONE) 25 MG tablet Take 25 mg by mouth daily.    . tamsulosin (FLOMAX) 0.4 MG  CAPS capsule Take 0.4 mg by mouth daily after supper.     . torsemide (DEMADEX) 100 MG tablet Take 1 tablet (100 mg total) by mouth 2 (two) times daily. 180 tablet 3  . triamcinolone ointment (KENALOG) 0.1 % Apply 1 application topically 2 (two) times daily. (Patient taking differently: Apply 1 application topically daily as needed. ) 80 g 1  . warfarin (COUMADIN) 5 MG tablet TAKE 1 DAILY EXCEPT 1.5 TABLETS ON THURS OR AS DIRECTED BY COUMADIN CLINIC. 100 tablet 0   No current facility-administered medications for this visit.    REVIEW OF SYSTEMS:   A 10+ POINT REVIEW OF SYSTEMS WAS OBTAINED including neurology, dermatology, psychiatry, cardiac, respiratory, lymph, extremities, GI, GU, Musculoskeletal, constitutional, breasts, reproductive, HEENT.  All pertinent positives are noted in the HPI.  All others are negative.   PHYSICAL EXAMINATION: ECOG PERFORMANCE STATUS: 1 - Symptomatic but completely ambulatory  Vitals:   08/20/19 0955  BP: (!) 109/57  Pulse: 62  Resp: 18  Temp: 98.3 F (36.8 C)  SpO2: 95%   Filed Weights   08/20/19 0955  Weight: 236 lb 12.8 oz (107.4 kg)    Exam was given in a wheelchair   GENERAL:alert, in no acute distress and comfortable SKIN: no acute rashes, no significant lesions EYES: conjunctiva are pink and non-injected, sclera anicteric OROPHARYNX: MMM, no exudates, no oropharyngeal erythema or ulceration NECK: supple, no JVD LYMPH:  no palpable lymphadenopathy in the cervical, axillary or inguinal regions LUNGS: clear to auscultation b/l with normal respiratory effort HEART: regular rate & rhythm ABDOMEN:  normoactive bowel sounds , non tender, not distended. No palpable hepatosplenomegaly.  Extremity: no pedal edema PSYCH: alert & oriented x 3 with fluent speech NEURO: no focal motor/sensory deficits  LABORATORY DATA:   . CBC Latest Ref Rng & Units 08/20/2019 08/20/2019 07/28/2019  WBC 4.0 - 10.5 K/uL 3.2(L) - 2.8(L)  Hemoglobin 13.0 - 17.0 g/dL  10.0(L) - 8.9(L)  Hematocrit 37.5 - 51.0 %  30.5(L) 31.1(L) 28.5(L)  Platelets 150 - 400 K/uL 53(L) - 60(L)   . CBC    Component Value Date/Time   WBC 3.2 (L) 08/20/2019 1105   RBC 2.62 (L) 08/20/2019 1105   HGB 10.0 (L) 08/20/2019 1105   HGB 9.3 (L) 08/01/2017 1136   HGB 11.6 (L) 03/30/2017 1147   HGB 12.0 (L) 01/19/2017 0827   HCT 31.1 (L) 08/20/2019 1105   HCT 30.5 (L) 08/20/2019 1105   HCT 37.8 (L) 01/19/2017 0827   PLT 53 (L) 08/20/2019 1105   PLT 56 (L) 08/01/2017 1136   PLT 59 (LL) 03/30/2017 1147   MCV 116.4 (H) 08/20/2019 1105   MCV 106 (H) 03/30/2017 1147   MCV 113.9 (H) 01/19/2017 0827   MCH 38.2 (H) 08/20/2019 1105   MCHC 32.8 08/20/2019 1105   RDW 18.0 (H) 08/20/2019 1105   RDW 14.5 03/30/2017 1147   RDW 16.2 (H) 01/19/2017 0827   LYMPHSABS 0.4 (L) 08/20/2019 1105   LYMPHSABS 0.8 (L) 01/19/2017 0827   MONOABS 0.3 08/20/2019 1105   MONOABS 0.3 01/19/2017 0827   EOSABS 0.0 08/20/2019 1105   EOSABS 0.1 01/19/2017 0827   BASOSABS 0.0 08/20/2019 1105   BASOSABS 0.0 01/19/2017 0827    . CMP Latest Ref Rng & Units 08/20/2019 07/28/2019 07/21/2019  Glucose 70 - 99 mg/dL 111(H) 133(H) 143(H)  BUN 8 - 23 mg/dL 78(H) 101(H) 74(H)  Creatinine 0.61 - 1.24 mg/dL 2.40(H) 2.59(H) 2.44(H)  Sodium 135 - 145 mmol/L 137 135 139  Potassium 3.5 - 5.1 mmol/L 4.0 3.4(L) 3.6  Chloride 98 - 111 mmol/L 96(L) 95(L) 98  CO2 22 - 32 mmol/L 26 27 29   Calcium 8.9 - 10.3 mg/dL 9.2 8.7(L) 9.4  Total Protein 6.5 - 8.1 g/dL 7.4 - -  Total Bilirubin 0.3 - 1.2 mg/dL 1.4(H) - -  Alkaline Phos 38 - 126 U/L 142(H) - -  AST 15 - 41 U/L 29 - -  ALT 0 - 44 U/L 22 - -    RADIOGRAPHIC STUDIES: I have personally reviewed the radiological images as listed and agreed with the findings in the report. No results found.  ASSESSMENT & PLAN   1) Thrombocytopenia. Moderate. Platelet counts are stable and the same ballpark in 60-70k  range for the last >1 year, early 2018. 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 + cannot rule out ITP element. Could have possible element of MDS. He has some pelgeroid neutrophils on his peripheral blood smear that are suggestive of possible MDS. Hepatitis B, and C serologies negative. No evidence of a lymphoproliferative syndrome on peripheral blood smear or clinical examination. SPEP showed no monoclonal protein.  Plan - patient's platelets remain low but stable at 53k . -If platelet counts drop below 50K might need to limit the INR fluctuations on Coumadin from 2-3 more strictly. -continue absolute alcohol cessation -Avoid NSAIDS and other medications known to cause thrombocytopenia especially ranitidine and other  H2 blockers, sulfa drugs, PPIs. -continue vitamin B12 2000 g sublingually daily and Folic acid 1 mg by mouth daily to support accelerated hematopoeisis with valve hemolysis. -Coumadin monitoring as per cardiology clinic -if plts <50k (due to anticoagulation) or if bleeding issues might need to consider Nplate/Promacta.  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 Has somewhat elevated LDH confirms the presence of low-grade hemolysis. 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 PLAN: -Discussed  pt labwork today, 08/20/19; all values are WNL except for WBC at 3.2K, RBC at 2.53, Hgb at 9.5, HCT at 28.2, MCV at 112, MCH at 37.5, RDW at 17.5, Lymphs Abs at 0.4K. -Discussed 08/12/2019 BMP shows all values are WNL except for: Glucose at 123, BUN at 97, Creatinine at 2.28, GFR Est Non Af Am at 26, BUN/Creatinine Ratio at 43, Chloride at 95, CO2 at 31 -Pt is anemic and macrocytic. This could be due to Vitamin B12/Folic acid deficiency, chronic hemolysis from valve, or MDS. -Advised pt that elevated fluid levels in his body could dilute his blood counts on testing. Could account for value  fluctuations.  -Advised pt that labs at last visit were suspicious for low-grade MDS. Would consider BM Bx to r/o MDS . -Advised pt that due to CKD we would keep Ferritin levels high. If Ferritin is well-replaced and pt is still anemic may consider ESAs.  -Recommend pt f/u with Dermatologist as scheduled  -Recommend pt f/u with Dr. [name] for Coumadin and Lasix management  -Will have pt restart Vitamin L41 and Folic acid based on todays labs  -Will get labs today  -Will see back in 1 week via phone   FOLLOW UP: Labs today Phone visit with Dr Irene Limbo in 1 week    The total time spent in the appt was 30 minutes and more than 50% was on counseling and direct patient cares.  All of the patient's questions were answered with apparent satisfaction. The patient knows to call the clinic with any problems, questions or concerns.  Sullivan Lone MD [facility]  (Office):       [phone] (Work cell):  [phone] (Fax):           [phone]  I, Yevette Edwards, am acting as a scribe for Dr. Sullivan Lone.   .I have reviewed the above documentation for accuracy and completeness, and I agree with the above. Brunetta Genera MD    .

## 2019-08-20 NOTE — Patient Instructions (Signed)
Thank you for choosing [facility] to provide your oncology and hematology care.   Should you have questions after your visit to the [facility] ([facility]), please contact this office at [phone] between 8:30 AM and 4:30 PM.  Voice mails left after 4:00 PM may not be returned until the following business day.  Calls received after 4:30 PM will be answered by an off-site Nurse Triage Line.    Prescription Refills:  Please have your pharmacy contact us directly for most prescription requests.  Contact the office directly for refills of narcotics (pain medications). Allow 48-72 hours for refills.  Appointments: Please contact the [facility] scheduling department [phone] for questions regarding [facility] appointment scheduling.  Contact the schedulers with any scheduling changes so that your appointment can be rescheduled in a timely manner.   Central Scheduling for [facility] [phone] - Call to schedule procedures such as PET scans, CT scans, MRI, Ultrasound, etc.  To afford each patient quality time with our providers, please arrive 30 minutes before your scheduled appointment time.  If you arrive late for your appointment, you may be asked to reschedule.  We strive to give you quality time with our providers, and arriving late affects you and other patients whose appointments are after yours. If you are a no show for multiple scheduled visits, you may be dismissed from the clinic at the providers discretion.     Resources: [facility] Social Workers [phone] for additional information on assistance programs or assistance connecting with community support programs   [place] DSS  336-641-3447: Information regarding food stamps, Medicaid, and utility assistance [place] 336-333-6589   [place] [employer]'s shared-ride transportation service for eligible riders who have a disability that prevents them from riding the fixed route bus.   Medicare Rights Center  800-333-4114 Helps people with Medicare understand their rights and benefits, navigate the Medicare system, and secure the quality healthcare they deserve [employer] 800-227-2345 Assists patients locate various types of support and financial assistance Cancer Care: 1-800-813-HOPE (4673) Provides financial assistance, online support groups, medication/co-pay assistance.   Transportation Assistance for appointments at [facility]: Transportation Coordinator [phone]  Again, thank you for choosing [facility] for your care.       

## 2019-08-21 LAB — ERYTHROPOIETIN: Erythropoietin: 47.6 m[IU]/mL — ABNORMAL HIGH (ref 2.6–18.5)

## 2019-08-21 LAB — FOLATE RBC
Folate, Hemolysate: 585 ng/mL
Folate, RBC: 1881 ng/mL (ref >498)
Hematocrit: 31.1 % — ABNORMAL LOW (ref 37.5–51.0)

## 2019-08-21 LAB — KAPPA/LAMBDA LIGHT CHAINS
Kappa free light chain: 86 mg/L — ABNORMAL HIGH (ref 3.3–19.4)
Kappa, lambda light chain ratio: 1.58 (ref 0.26–1.65)
Lambda free light chains: 54.5 mg/L — ABNORMAL HIGH (ref 5.7–26.3)

## 2019-08-21 LAB — HAPTOGLOBIN: Haptoglobin: 22 mg/dL — ABNORMAL LOW (ref 38–329)

## 2019-08-22 ENCOUNTER — Ambulatory Visit (INDEPENDENT_AMBULATORY_CARE_PROVIDER_SITE_OTHER): Payer: Medicare Other | Admitting: Cardiovascular Disease

## 2019-08-22 DIAGNOSIS — Z5181 Encounter for therapeutic drug level monitoring: Secondary | ICD-10-CM

## 2019-08-22 LAB — COPPER, SERUM: Copper: 143 ug/dL — ABNORMAL HIGH (ref 69–132)

## 2019-08-22 NOTE — Patient Instructions (Signed)
Description   Spoke to pt and instructed for pt to continue to take 1 tablet daily except for 1/2 a tablet on Sunday, Tuesday and Thursday. Call coumadin clinic [phone] for any changes in medications or upcoming procedures.  [facility] to check INR 5/25.

## 2019-08-22 NOTE — Progress Notes (Signed)
Pt in the follow up book to have INR checked on 5/18. Never received lab results. Called [facility] and spoke Caryl Pina. Lab results were drawn on 5/18, received fax today on 5/21. Caryl Pina stated pt was to get blood work again on 5/25 ordered for pt have pt/inr drawn then and fax results to [phone].   [facility] [phone]

## 2019-08-25 LAB — MULTIPLE MYELOMA PANEL, SERUM
Albumin SerPl Elph-Mcnc: 3.9 g/dL (ref 2.9–4.4)
Albumin/Glob SerPl: 1.3 (ref 0.7–1.7)
Alpha 1: 0.3 g/dL (ref 0.0–0.4)
Alpha2 Glob SerPl Elph-Mcnc: 0.6 g/dL (ref 0.4–1.0)
B-Globulin SerPl Elph-Mcnc: 1.2 g/dL (ref 0.7–1.3)
Gamma Glob SerPl Elph-Mcnc: 1 g/dL (ref 0.4–1.8)
Globulin, Total: 3.1 g/dL (ref 2.2–3.9)
IgA: 313 mg/dL (ref 61–437)
IgG (Immunoglobin G), Serum: 930 mg/dL (ref 603–1613)
IgM (Immunoglobulin M), Srm: 47 mg/dL (ref 15–143)
Total Protein ELP: 7 g/dL (ref 6.0–8.5)

## 2019-08-26 ENCOUNTER — Other Ambulatory Visit: Payer: Self-pay

## 2019-08-26 ENCOUNTER — Encounter: Payer: Self-pay | Admitting: Dermatology

## 2019-08-26 ENCOUNTER — Ambulatory Visit (INDEPENDENT_AMBULATORY_CARE_PROVIDER_SITE_OTHER): Payer: Medicare Other | Admitting: Dermatology

## 2019-08-26 DIAGNOSIS — C44591 Other specified malignant neoplasm of skin of breast: Secondary | ICD-10-CM | POA: Diagnosis not present

## 2019-08-26 DIAGNOSIS — L821 Other seborrheic keratosis: Secondary | ICD-10-CM

## 2019-08-26 DIAGNOSIS — L570 Actinic keratosis: Secondary | ICD-10-CM | POA: Diagnosis not present

## 2019-08-26 DIAGNOSIS — D692 Other nonthrombocytopenic purpura: Secondary | ICD-10-CM

## 2019-08-26 DIAGNOSIS — I5032 Chronic diastolic (congestive) heart failure: Secondary | ICD-10-CM | POA: Diagnosis not present

## 2019-08-26 DIAGNOSIS — I50812 Chronic right heart failure: Secondary | ICD-10-CM | POA: Diagnosis not present

## 2019-08-26 DIAGNOSIS — C4491 Basal cell carcinoma of skin, unspecified: Secondary | ICD-10-CM

## 2019-08-26 LAB — PROTIME-INR: INR: 2 — AB (ref 0.9–1.1)

## 2019-08-26 NOTE — Progress Notes (Signed)
New Patient remarkable bruising on [name] abdomen chest arms and back so biopsies deferred.  Multiple seborrheic keratoses on upper back which require no intervention.  Subjective  [name] is a 84 y.o. male who presents for the following: Skin Problem (skin check on chest and face).  Growths Location: Left chest and scalp Duration: Several months Quality: Larger Associated Signs/Symptoms: Scalp lesion previously frozen Modifying Factors:  Severity:  Timing: Context:    The following portions of the chart were reviewed this encounter and updated as appropriate:     Objective  Well appearing patient in no apparent distress; mood and affect are within normal limits.  All skin waist up examined.   Assessment & Plan  Basal cell carcinoma (BCC), unspecified site (2) Left Breast  Destruction of lesion  Mid Frontal Scalp  Destruction of lesion  First visit for [name] who is here with his wife.  A growth on his scalp had been treated with freezing but it never cleared and has recently grown.  Of more concern is a slowly  enlarging nodule on his left chest for the past year.  Importantly, he has a history of low platelet counts with his last count 2 weeks ago 53,000 but he has noted increased bruising in the last several days.  Examination showed a 1 cm exophytic dark crust on the left medial frontal scalp and a 2 cm pearly telangiectatic nodule on the left central chest.  Each of these likely represents carcinoma and biopsy is indicated.  He has diffuse bruising over 50% of his lower back, chest, abdomen, and upper arms.  His conjunctiva are clear and mucosa not notably pale.  He is scheduled to see his hematologist on Friday and I deferred obtaining skin biopsies until I am certain his platelet count is at least 50,000.  If his platelet count is adequate, he or his wife will call me and I will try and add him on for biopsies within 24 hours.  The remainder of [name] waist up skin examination was rather unremarkable.  There are few actinic keratoses on the head and neck which require no intervention.  Multiple seborrheic keratosis on the upper back.  No atypical moles.

## 2019-08-27 ENCOUNTER — Encounter: Payer: Self-pay | Admitting: Dermatology

## 2019-08-28 ENCOUNTER — Telehealth: Payer: Self-pay | Admitting: *Deleted

## 2019-08-28 ENCOUNTER — Other Ambulatory Visit (HOSPITAL_COMMUNITY): Payer: Self-pay | Admitting: Cardiology

## 2019-08-28 ENCOUNTER — Ambulatory Visit (INDEPENDENT_AMBULATORY_CARE_PROVIDER_SITE_OTHER): Payer: Medicare Other | Admitting: Cardiovascular Disease

## 2019-08-28 DIAGNOSIS — Z5181 Encounter for therapeutic drug level monitoring: Secondary | ICD-10-CM | POA: Diagnosis not present

## 2019-08-28 NOTE — Telephone Encounter (Signed)
Pt in the coumadin clinic follow up book. Pt was suppose to get INR on 08/26/2019 and have not received blood work. Eustace Pen, from [facility] who stated pt did get INR drawn on 5/25. Caryl Pina stated she would fax results to coumadin clinic. Fax received. Caryl Pina stated that pt will be getting blood work on 6/1, requested that they draw a pt/inr while they are getting other labs and fax INR results to coumadin clinic [phone].

## 2019-08-28 NOTE — Progress Notes (Signed)
[facility]ology [facility] Note  Date of service: 08/01/17   Patient Care Team: Cassandria Anger, MD as PCP - General (Internal Medicine) Belva Crome, MD as PCP - Cardiology (Cardiology) Sueanne Margarita, MD as PCP - Sleep Medicine (Sleep Medicine) Brunetta Genera, MD as Consulting Physician (Hematology) Belva Crome, MD as Consulting Physician (Cardiology) Elayne Snare, MD as Consulting Physician (Endocrinology) Lavonna Monarch, MD as Consulting Physician (Dermatology)  CHIEF COMPLAINTS: follow-up for Macrocytosis and Thrombocytopenia.  HISTORY OF PRESENTING ILLNESS: Please see my initial note for details on initial presentation.  Interval history:  I connected with [name] on 08/29/19 at  9:40 AM EDT and verified that I am speaking with the correct person using two identifiers.   I discussed the limitations, risks, security and privacy concerns of performing an evaluation and management service by telemedicine and the availability of in-person appointments. I also discussed with the patient that there may be a patient responsible charge related to this service. The patient expressed understanding and agreed to proceed.   Other persons participating in the visit and their role in the encounter:     -Dawayne Cirri, Medical Scribe   Patient's location: Home  Provider's location: [place] [facility] [place]   [name] is here for follow-up regarding his thrombocytopenia. The patient's last visit with [place] was on 08/20/19. The pt reports that he is doing well overall.  The pt reports he is good. He is currently taking allopurinol and when he doesn't have it he has a gout attack within a day or two. Pt has been on allopurinol since 2019. He went to a dermatologist and they wouldn't take biopsy unless platelets are above 50K.   Lab results today (08/20/19) of CBC w/diff and CMP is as follows: all values are WNL except for WBC at 3.2K, RBC at 2.62, Hemoglobin  at 10, HCT at 30.5, MCV at 116.4, MCH at 38.2, RDW at 18, Platelets at 53K, Lymph Abs at 0.4K, Chloride at 96, Glucose at 111, BUN at 78, Creatinine at 2.40, Alkaline Phosphatase at 142, Total Bilirubin at 1.4, GFR, Est Non Af Am at 24, GFR, Est AFR Am at 28 08/20/19 of Ferritin at 319: WNL 08/20/19 of MMP is as follows: all values are WNL  08/20/19 of LDH at 285 08/20/19 of Erythropoietin at 47.6 08/20/19 of iron and TIBC is as follows: all values are WNL 08/20/19 of Copper Serum at 143 08/20/19 of Folate RBC is as follows: all values are WNL except for Hematocrit at 31.1 08/20/19 of Vitamin B12 at 454: WNL 08/20/19 of Kappa/Lambda Light Chains is as follows: all values are WNL except for kappa free light chain at 86, lambda free light chains at 54.5 08/20/19 of Haptoglobin at 22   MEDICAL HISTORY:  Past Medical History:  Diagnosis Date  . Carotid artery disease ([place]) 1994   s/p left carotid endarerectomy   . Chronic atrial fibrillation (HCC)    a. on coumadin   . Chronic diastolic CHF (congestive heart failure) ([place])   . Diabetes mellitus without complication ([place])    dx 2016  . Dyspnea   . Epistaxis   . Heart murmur   . Hx of CABG    a. 1994  . Hypertension   . OSA (obstructive sleep apnea)   . Rheumatic fever   . S/P AVR (aortic valve replacement)    a. mechanical valve 1996  . Subclavian bypass stenosis ([place])   . Temporary  low platelet count (HCC)    chronic problem since receiving aortic valve replacement  . Vitamin B12 deficiency     SURGICAL HISTORY: Past Surgical History:  Procedure Laterality Date  . artificial valve    . [place]   replaced due to aortic stenosis, [employer] mechanical prostesis  . CAROTID ENDARTERECTOMY Left 1997   subclavian bypass Done in [place]  . CORONARY ARTERY BYPASS GRAFT  1994   w SVG to RCA and SVG to circumflex  . heart bypass     Done in [place]  . MULTIPLE EXTRACTIONS WITH ALVEOLOPLASTY Bilateral 12/12/2017    Procedure: Extraction of tooth #'s 1, 12,13,14,15,17,18,19, 29, and 30 with alveoloplasty and gross debridement of remaining teeth`;  Surgeon: Lenn Cal, DDS;  Location: [facility];  Service: Oral Surgery;  Laterality: Bilateral;  MULTIPLE EXTRACTION WITH ALVEOLOPLASTY WITH PRE PROSTHETIC SURGERY AND GROSS DEBRIDEMENT OF REMAINING TEETH  . RIGHT HEART CATH AND CORONARY/GRAFT ANGIOGRAPHY N/A 04/30/2018   Procedure: RIGHT HEART CATH AND CORONARY/GRAFT ANGIOGRAPHY;  Surgeon: Larey Dresser, MD;  Location: [facility];  Service: Cardiovascular;  Laterality: N/A;  . TEE WITHOUT CARDIOVERSION N/A 10/23/2017   Procedure: TRANSESOPHAGEAL ECHOCARDIOGRAM (TEE);  Surgeon: Larey Dresser, MD;  [facility] ENDOSCOPY;  Service: Cardiovascular;  Laterality: N/A;  . TONSILLECTOMY      SOCIAL HISTORY: Social History   Socioeconomic History  . Marital status: Married    Spouse name: Not on file  . Number of children: 3  . Years of education: Not on file  . Highest education level: Not on file  Occupational History  . Not on file  Tobacco Use  . Smoking status: Former Smoker    Packs/day: 1.00    Years: 30.00    Pack years: 30.00    Types: Cigarettes    Quit date: 04/03/1992    Years since quitting: 27.4  . Smokeless tobacco: Never Used  Substance and Sexual Activity  . Alcohol use: Yes    Comment: 4-6 ounces of wine daily  . Drug use: No    Comment: Half a cup a day.  Marland Kitchen Sexual activity: Not on file  Other Topics Concern  . Not on file  Social History Narrative   Patient is married and lives with his wife.   Youngest daughter lives in [place] also.   Patient has 2 other children.   Social Determinants of Health   Financial Resource Strain:   . Difficulty of Paying Living Expenses:   Food Insecurity:   . Worried About Charity fundraiser in the Last Year:   . Arboriculturist in the Last Year:   Transportation Needs:   . Film/video editor (Medical):   Marland Kitchen Lack of  Transportation (Non-Medical):   Physical Activity:   . Days of Exercise per Week:   . Minutes of Exercise per Session:   Stress:   . Feeling of Stress :   Social Connections:   . Frequency of Communication with Friends and Family:   . Frequency of Social Gatherings with Friends and Family:   . Attends Religious Services:   . Active Member of Clubs or Organizations:   . Attends Archivist Meetings:   Marland Kitchen Marital Status:   Intimate Partner Violence:   . Fear of Current or Ex-Partner:   . Emotionally Abused:   Marland Kitchen Physically Abused:   . Sexually Abused:     FAMILY HISTORY: Family History  Problem Relation Age  of Onset  . Cancer Father        lung   . Hypertension Mother     ALLERGIES:  is allergic to rifampin and vancomycin.  MEDICATIONS:  Current Outpatient Medications  Medication Sig Dispense Refill  . acetaminophen (TYLENOL) 500 MG tablet Take 1,000 mg by mouth every 6 (six) hours as needed for moderate pain or headache.    . albuterol (PROAIR HFA) 108 (90 Base) MCG/ACT inhaler Inhale 2 puffs into the lungs every 4 (four) hours as needed for wheezing or shortness of breath. 18 g 5  . allopurinol (ZYLOPRIM) 100 MG tablet Take 1 tablet daily 30 tablet 6  . amiodarone (PACERONE) 200 MG tablet Take 1 tablet (200 mg total) by mouth daily. 90 tablet 3  . carvedilol (COREG) 3.125 MG tablet Take 1 tablet (3.125 mg total) by mouth 2 (two) times daily. 180 tablet 3  . ezetimibe-simvastatin (VYTORIN) 10-20 MG tablet TAKE 1 TABLET BY MOUTH EVERYDAY AT BEDTIME 90 tablet 3  . finasteride (PROSCAR) 5 MG tablet Take 5 mg by mouth daily.    Marland Kitchen levothyroxine (SYNTHROID) 50 MCG tablet TAKE 1 TABLET BY MOUTH DAILY BEFORE BREAKFAST 90 tablet 3  . metolazone (ZAROXOLYN) 2.5 MG tablet Take 1 tablet (2.5 mg total) by mouth 2 (two) times a week. 38 tablet 3  . polyethylene glycol powder (GLYCOLAX/MIRALAX) powder Take 17 g by mouth daily as needed for moderate constipation.    . potassium  chloride SA (KLOR-CON M20) 20 MEQ tablet Take 60 meq (3 tabs) in AM, 40 meq (2 tabs) at noon and 40 meq (2 tabs) in PM 630 tablet 1  . spironolactone (ALDACTONE) 25 MG tablet Take 25 mg by mouth daily.    Marland Kitchen spironolactone (ALDACTONE) 25 MG tablet     . tamsulosin (FLOMAX) 0.4 MG CAPS capsule Take 0.4 mg by mouth daily after supper.     . torsemide (DEMADEX) 100 MG tablet Take 1 tablet (100 mg total) by mouth 2 (two) times daily. 180 tablet 3  . triamcinolone ointment (KENALOG) 0.1 % Apply 1 application topically 2 (two) times daily. (Patient taking differently: Apply 1 application topically daily as needed. ) 80 g 1  . warfarin (COUMADIN) 5 MG tablet TAKE 1 DAILY EXCEPT 1.5 TABLETS ON THURS OR AS DIRECTED BY COUMADIN CLINIC. 100 tablet 0   No current facility-administered medications for this visit.    REVIEW OF SYSTEMS:   A 10+ POINT REVIEW OF SYSTEMS WAS OBTAINED including neurology, dermatology, psychiatry, cardiac, respiratory, lymph, extremities, GI, GU, Musculoskeletal, constitutional, breasts, reproductive, HEENT.  All pertinent positives are noted in the HPI.  All others are negative.   PHYSICAL EXAMINATION: ECOG PERFORMANCE STATUS: 1 - Symptomatic but completely ambulatory  There were no vitals filed for this visit. There were no vitals filed for this visit.  Tele-health Visit   LABORATORY DATA:   . CBC Latest Ref Rng & Units 08/20/2019 08/20/2019 07/28/2019  WBC 4.0 - 10.5 K/uL 3.2(L) - 2.8(L)  Hemoglobin 13.0 - 17.0 g/dL 10.0(L) - 8.9(L)  Hematocrit 37.5 - 51.0 % 30.5(L) 31.1(L) 28.5(L)  Platelets 150 - 400 K/uL 53(L) - 60(L)   . CBC    Component Value Date/Time   WBC 3.2 (L) 08/20/2019 1105   RBC 2.62 (L) 08/20/2019 1105   HGB 10.0 (L) 08/20/2019 1105   HGB 9.3 (L) 08/01/2017 1136   HGB 11.6 (L) 03/30/2017 1147   HGB 12.0 (L) 01/19/2017 0827   HCT 31.1 (L) 08/20/2019 1105  HCT 30.5 (L) 08/20/2019 1105   HCT 37.8 (L) 01/19/2017 0827   PLT 53 (L) 08/20/2019 1105    PLT 56 (L) 08/01/2017 1136   PLT 59 (LL) 03/30/2017 1147   MCV 116.4 (H) 08/20/2019 1105   MCV 106 (H) 03/30/2017 1147   MCV 113.9 (H) 01/19/2017 0827   MCH 38.2 (H) 08/20/2019 1105   MCHC 32.8 08/20/2019 1105   RDW 18.0 (H) 08/20/2019 1105   RDW 14.5 03/30/2017 1147   RDW 16.2 (H) 01/19/2017 0827   LYMPHSABS 0.4 (L) 08/20/2019 1105   LYMPHSABS 0.8 (L) 01/19/2017 0827   MONOABS 0.3 08/20/2019 1105   MONOABS 0.3 01/19/2017 0827   EOSABS 0.0 08/20/2019 1105   EOSABS 0.1 01/19/2017 0827   BASOSABS 0.0 08/20/2019 1105   BASOSABS 0.0 01/19/2017 0827    . CMP Latest Ref Rng & Units 08/20/2019 07/28/2019 07/21/2019  Glucose 70 - 99 mg/dL 111(H) 133(H) 143(H)  BUN 8 - 23 mg/dL 78(H) 101(H) 74(H)  Creatinine 0.61 - 1.24 mg/dL 2.40(H) 2.59(H) 2.44(H)  Sodium 135 - 145 mmol/L 137 135 139  Potassium 3.5 - 5.1 mmol/L 4.0 3.4(L) 3.6  Chloride 98 - 111 mmol/L 96(L) 95(L) 98  CO2 22 - 32 mmol/L _0 Calcium 8.9 - 10.3 mg/dL 9.2 8.7(L) 9.4  Total Protein 6.5 - 8.1 g/dL 7.4 - -  Total Bilirubin 0.3 - 1.2 mg/dL 1.4(H) - -  Alkaline Phos 38 - 126 U/L 142(H) - -  AST 15 - 41 U/L 29 - -  ALT 0 - 44 U/L 22 - -    RADIOGRAPHIC STUDIES: I have personally reviewed the radiological images as listed and agreed with the findings in the report. No results found.  ASSESSMENT & PLAN   1) Thrombocytopenia. Moderate. Platelet counts are stable and the same ballpark in 60-70k  range for the last >1 year, early 2018. 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 + cannot rule out ITP element. Could have possible element of MDS. He has some pelgeroid neutrophils on his peripheral blood smear that are suggestive of possible MDS. Hepatitis B, and C serologies negative. No evidence of a lymphoproliferative syndrome on peripheral blood smear or clinical examination. SPEP showed no monoclonal protein.  Plan - patient's platelets  remain low but stable at 53k . -If platelet counts drop below 50K might need to limit the INR fluctuations on Coumadin from 2-3 more strictly. -continue absolute alcohol cessation -Avoid NSAIDS and other medications known to cause thrombocytopenia especially ranitidine and other  H2 blockers, sulfa drugs, PPIs. -continue vitamin B12 2000 g sublingually daily and Folic acid 1 mg by mouth daily to support accelerated hematopoeisis with valve hemolysis. -Coumadin monitoring as per cardiology clinic -if plts <50k (due to anticoagulation) or if bleeding issues might need to consider Nplate/Promacta.  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 Has somewhat elevated LDH confirms the presence of low-grade hemolysis. 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  PLAN: -Discussed pt labwork today, 08/20/19;of CBC w/diff and CMP is as follows: all values are WNL except for WBC at 3.2K, RBC at 2.62, Hemoglobin at 10, HCT at 30.5, MCV at 116.4, MCH at 38.2, RDW at 18, Platelets at 53K, Lymph Abs at 0.4K, Chloride at 96, Glucose at 111, BUN at 78, Creatinine at 2.40, Alkaline Phosphatase at 142, Total Bilirubin at 1.4, GFR, Est Non Af Am at  24, GFR, Est AFR Am at 28 -Discussed 08/20/19 of Ferritin at 319: WNL -Discussed 08/20/19 of MMP is as follows: all values are WNL  -Discussed 08/20/19 of LDH at 285 -Discussed 08/20/19 of Erythropoietin at 47.6 -Discussed 08/20/19 of iron and TIBC is as follows: all values are WNL -Discussed 08/20/19 of Copper Serum at 143 -Discussed 08/20/19 of Folate RBC is as follows: all values are WNL except for Hematocrit at 31.1 -Discussed 08/20/19 of Vitamin B12 at 454: WNL -Discussed 08/20/19 of Kappa/Lambda Light Chains is as follows: all values are WNL except for kappa free light chain at 86, lambda free light chains at 54.5 -Discussed 08/20/19 of Haptoglobin at 22 -Platelets are still low at  53K -Goal ferritin >200 -Pt is anemic and macrocytic. This could be due to Vitamin B12/Folic acid deficiency, chronic hemolysis from valve, or MDS. -Advised pt that elevated fluid levels in his body could dilute his blood counts on testing. Could account for value fluctuations.  -Advised pt that labs were suspicious for low-grade MDS. Would consider BM Bx to r/o MDS . -Advised pt that due to CKD we would keep Ferritin levels high. If Ferritin is well-replaced and pt is still anemic may consider ESAs.   -Recommend pt f/u with Dr. 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  -Advised allopurinol can lower platelets  -Advised on optimizing thyroid function  -Advised on conservative approach  -Advised if platelets drop would have to put on platelet stimulating medication -Recommends avoiding NSAID's and any medication that could drop platelets  -Pt prefers managing factors and not getting bone marrow biopsy  -Pt is on blood thinners and has CKD -Recommends maybe needing IV iron to keep levels higher -Recommends Vitamin B-complex and Folic acid daily  -Will see back in 2 months, with labs   FOLLOW UP: RTC with Dr Irene Limbo in 2 months with labs  The total time spent in the appt was 30 minutes and more than 50% was on counseling and direct patient cares.  All of the patient's questions were answered with apparent satisfaction. The patient knows to call the clinic with any problems, questions or concerns.  Sullivan Lone MD MS Hematology/Oncology Physician [facility]  (Office):       [phone] (Work cell):  [phone] (Fax):           [phone]  I, Dawayne Cirri am acting as a scribe for Dr. Sullivan Lone.   .I have reviewed the above documentation for accuracy and  completeness, and I agree with the above. Brunetta Genera MD     .

## 2019-08-28 NOTE — Patient Instructions (Signed)
Description   Spoke to pt and instructed for pt to take 1 tablet today and then continue to take 1 tablet daily except for 1/2 a tablet on Sunday, Tuesday and Thursday. Call coumadin clinic [phone] for any changes in medications or upcoming procedures.  [facility] to check INR 6/1 when they get other blood work.

## 2019-08-29 ENCOUNTER — Inpatient Hospital Stay (HOSPITAL_BASED_OUTPATIENT_CLINIC_OR_DEPARTMENT_OTHER): Payer: Medicare Other | Admitting: Hematology

## 2019-08-29 DIAGNOSIS — N184 Chronic kidney disease, stage 4 (severe): Secondary | ICD-10-CM | POA: Diagnosis not present

## 2019-08-29 DIAGNOSIS — D72819 Decreased white blood cell count, unspecified: Secondary | ICD-10-CM

## 2019-08-29 DIAGNOSIS — D696 Thrombocytopenia, unspecified: Secondary | ICD-10-CM | POA: Diagnosis not present

## 2019-08-29 DIAGNOSIS — D649 Anemia, unspecified: Secondary | ICD-10-CM

## 2019-08-29 DIAGNOSIS — I5042 Chronic combined systolic (congestive) and diastolic (congestive) heart failure: Secondary | ICD-10-CM | POA: Diagnosis not present

## 2019-08-29 DIAGNOSIS — Z7901 Long term (current) use of anticoagulants: Secondary | ICD-10-CM | POA: Diagnosis not present

## 2019-08-29 DIAGNOSIS — D631 Anemia in chronic kidney disease: Secondary | ICD-10-CM | POA: Diagnosis not present

## 2019-08-29 DIAGNOSIS — I1310 Hypertensive heart and chronic kidney disease without heart failure, with stage 1 through stage 4 chronic kidney disease, or unspecified chronic kidney disease: Secondary | ICD-10-CM | POA: Diagnosis not present

## 2019-09-02 ENCOUNTER — Other Ambulatory Visit (HOSPITAL_COMMUNITY): Payer: Self-pay | Admitting: Cardiology

## 2019-09-02 DIAGNOSIS — I5031 Acute diastolic (congestive) heart failure: Secondary | ICD-10-CM | POA: Diagnosis not present

## 2019-09-02 DIAGNOSIS — I50812 Chronic right heart failure: Secondary | ICD-10-CM | POA: Diagnosis not present

## 2019-09-02 LAB — PROTIME-INR: INR: 2.1 — AB (ref <=1.1)

## 2019-09-03 ENCOUNTER — Other Ambulatory Visit (HOSPITAL_COMMUNITY): Payer: Self-pay | Admitting: Cardiology

## 2019-09-03 ENCOUNTER — Telehealth: Payer: Self-pay | Admitting: Hematology

## 2019-09-03 ENCOUNTER — Ambulatory Visit (INDEPENDENT_AMBULATORY_CARE_PROVIDER_SITE_OTHER): Payer: Medicare Other

## 2019-09-03 DIAGNOSIS — Z952 Presence of prosthetic heart valve: Secondary | ICD-10-CM | POA: Diagnosis not present

## 2019-09-03 DIAGNOSIS — R3912 Poor urinary stream: Secondary | ICD-10-CM | POA: Diagnosis not present

## 2019-09-03 DIAGNOSIS — N401 Enlarged prostate with lower urinary tract symptoms: Secondary | ICD-10-CM | POA: Diagnosis not present

## 2019-09-03 DIAGNOSIS — I482 Chronic atrial fibrillation, unspecified: Secondary | ICD-10-CM | POA: Diagnosis not present

## 2019-09-03 DIAGNOSIS — N3021 Other chronic cystitis with hematuria: Secondary | ICD-10-CM | POA: Diagnosis not present

## 2019-09-03 DIAGNOSIS — R3914 Feeling of incomplete bladder emptying: Secondary | ICD-10-CM | POA: Diagnosis not present

## 2019-09-03 NOTE — Patient Instructions (Addendum)
Description   Spoke to pt's wife and instructed for pt to take 1.5 tablets today and then start taking 1 tablet daily except for 1/2 a tablet on Sundays and Thursdays. Call coumadin clinic [phone] for any changes in medications or upcoming procedures.  [facility] to check INR in 1 week.

## 2019-09-03 NOTE — Telephone Encounter (Signed)
Scheduled per 05/28 los, called patient and patient is notified.

## 2019-09-05 ENCOUNTER — Telehealth (HOSPITAL_COMMUNITY): Payer: Self-pay | Admitting: Pharmacist

## 2019-09-05 MED ORDER — CIPROFLOXACIN HCL 500 MG PO TABS: 500.0000 mg | ORAL_TABLET | Freq: Every day | ORAL | 0 refills | Status: AC

## 2019-09-05 MED ORDER — AMOXICILLIN-POT CLAVULANATE 500-125 MG PO TABS
1.0000 | ORAL_TABLET | Freq: Two times a day (BID) | ORAL | 0 refills | Status: AC
Start: 2019-09-05 — End: 2019-09-12

## 2019-09-05 NOTE — Telephone Encounter (Signed)
New prescriptions for ciprofloxacin and augmentin sent to [facility] per Dr. [name].

## 2019-09-05 NOTE — Telephone Encounter (Signed)
Will check INR on Wed via remote [facility]

## 2019-09-08 ENCOUNTER — Other Ambulatory Visit (HOSPITAL_COMMUNITY): Payer: Self-pay | Admitting: Cardiology

## 2019-09-10 DIAGNOSIS — I5032 Chronic diastolic (congestive) heart failure: Secondary | ICD-10-CM | POA: Diagnosis not present

## 2019-09-10 DIAGNOSIS — I50812 Chronic right heart failure: Secondary | ICD-10-CM | POA: Diagnosis not present

## 2019-09-10 DIAGNOSIS — I4891 Unspecified atrial fibrillation: Secondary | ICD-10-CM | POA: Diagnosis not present

## 2019-09-10 LAB — PROTIME-INR: INR: 2.2 — AB (ref 0.9–1.1)

## 2019-09-12 ENCOUNTER — Ambulatory Visit (INDEPENDENT_AMBULATORY_CARE_PROVIDER_SITE_OTHER): Payer: Medicare Other | Admitting: Interventional Cardiology

## 2019-09-12 DIAGNOSIS — Z5181 Encounter for therapeutic drug level monitoring: Secondary | ICD-10-CM | POA: Diagnosis not present

## 2019-09-12 NOTE — Patient Instructions (Signed)
Description   Spoke to pt instructed for pt to take 1.5 tablets today and then start taking 1 tablet daily except for 1/2 on Thursdays. Call coumadin clinic [phone] for any changes in medications or upcoming procedures.  [facility] to check INR in 1 week,

## 2019-09-15 ENCOUNTER — Ambulatory Visit (INDEPENDENT_AMBULATORY_CARE_PROVIDER_SITE_OTHER): Payer: Medicare Other | Admitting: Interventional Cardiology

## 2019-09-15 DIAGNOSIS — Z5181 Encounter for therapeutic drug level monitoring: Secondary | ICD-10-CM

## 2019-09-15 DIAGNOSIS — Z952 Presence of prosthetic heart valve: Secondary | ICD-10-CM | POA: Diagnosis not present

## 2019-09-15 LAB — PROTIME-INR: INR: 2.8 — AB (ref 0.9–1.1)

## 2019-09-15 NOTE — Progress Notes (Signed)
Spoke to [name] from remote health and she confirmed that pt is on the schedule to have INR drawn on 6/21.

## 2019-09-15 NOTE — Patient Instructions (Signed)
Description   Spoke to pt instructed continue taking 1 tablet daily except for 1/2 on Thursdays. Call coumadin clinic [phone] for any changes in medications or upcoming procedures.  [facility] to check INR in 1 week.

## 2019-09-22 LAB — PROTIME-INR: INR: 3.2 — AB (ref 0.9–1.1)

## 2019-09-23 ENCOUNTER — Ambulatory Visit (INDEPENDENT_AMBULATORY_CARE_PROVIDER_SITE_OTHER): Payer: Medicare Other | Admitting: Cardiovascular Disease

## 2019-09-23 ENCOUNTER — Ambulatory Visit (INDEPENDENT_AMBULATORY_CARE_PROVIDER_SITE_OTHER): Payer: Medicare Other | Admitting: Internal Medicine

## 2019-09-23 ENCOUNTER — Encounter: Payer: Self-pay | Admitting: Internal Medicine

## 2019-09-23 ENCOUNTER — Other Ambulatory Visit: Payer: Self-pay

## 2019-09-23 DIAGNOSIS — D61818 Other pancytopenia: Secondary | ICD-10-CM | POA: Diagnosis not present

## 2019-09-23 DIAGNOSIS — I5032 Chronic diastolic (congestive) heart failure: Secondary | ICD-10-CM | POA: Diagnosis not present

## 2019-09-23 DIAGNOSIS — I482 Chronic atrial fibrillation, unspecified: Secondary | ICD-10-CM

## 2019-09-23 DIAGNOSIS — Z5181 Encounter for therapeutic drug level monitoring: Secondary | ICD-10-CM

## 2019-09-23 DIAGNOSIS — R609 Edema, unspecified: Secondary | ICD-10-CM | POA: Diagnosis not present

## 2019-09-23 DIAGNOSIS — Z7901 Long term (current) use of anticoagulants: Secondary | ICD-10-CM

## 2019-09-23 NOTE — Assessment & Plan Note (Signed)
F/u w/dr Irene Limbo

## 2019-09-23 NOTE — Progress Notes (Signed)
Subjective:  Patient ID: [name], male    DOB: [birth year]  Age: 84 y.o. MRN: [id number]  CC: No chief complaint on file.   HPI [name] presents for CHF, hypothyroidism, blood disorder f/u  Outpatient Medications Prior to Visit  Medication Sig Dispense Refill  . acetaminophen (TYLENOL) 500 MG tablet Take 1,000 mg by mouth every 6 (six) hours as needed for moderate pain or headache.    . albuterol (PROAIR HFA) 108 (90 Base) MCG/ACT inhaler Inhale 2 puffs into the lungs every 4 (four) hours as needed for wheezing or shortness of breath. 18 g 5  . allopurinol (ZYLOPRIM) 100 MG tablet TAKE 1 TABLET BY MOUTH EVERY DAY 90 tablet 3  . amiodarone (PACERONE) 200 MG tablet Take 1 tablet (200 mg total) by mouth daily. 90 tablet 3  . carvedilol (COREG) 3.125 MG tablet Take 1 tablet (3.125 mg total) by mouth 2 (two) times daily. 180 tablet 3  . ezetimibe-simvastatin (VYTORIN) 10-20 MG tablet TAKE 1 TABLET BY MOUTH EVERYDAY AT BEDTIME 90 tablet 3  . finasteride (PROSCAR) 5 MG tablet Take 5 mg by mouth daily.    Marland Kitchen levothyroxine (SYNTHROID) 50 MCG tablet TAKE 1 TABLET BY MOUTH DAILY BEFORE BREAKFAST 90 tablet 3  . metolazone (ZAROXOLYN) 2.5 MG tablet Take 1 tablet (2.5 mg total) by mouth 2 (two) times a week. 38 tablet 3  . polyethylene glycol powder (GLYCOLAX/MIRALAX) powder Take 17 g by mouth daily as needed for moderate constipation.    . potassium chloride SA (KLOR-CON M20) 20 MEQ tablet Take 60 meq (3 tabs) in AM, 40 meq (2 tabs) at noon and 40 meq (2 tabs) in PM 630 tablet 1  . spironolactone (ALDACTONE) 25 MG tablet Take 25 mg by mouth daily.    Marland Kitchen spironolactone (ALDACTONE) 25 MG tablet     . tamsulosin (FLOMAX) 0.4 MG CAPS capsule Take 0.4 mg by mouth daily after supper.     . torsemide (DEMADEX) 100 MG tablet Take 1 tablet (100 mg total) by mouth 2 (two) times daily. 180 tablet 3  . triamcinolone ointment (KENALOG) 0.1 % Apply 1 application topically 2 (two) times daily. (Patient  taking differently: Apply 1 application topically daily as needed. ) 80 g 1  . warfarin (COUMADIN) 5 MG tablet TAKE 1 DAILY EXCEPT 1.5 TABLETS ON THURS OR AS DIRECTED BY COUMADIN CLINIC. 100 tablet 0   No facility-administered medications prior to visit.    ROS: Review of Systems  Constitutional: Positive for fatigue. Negative for appetite change and unexpected weight change.  HENT: Negative for congestion, nosebleeds, sneezing, sore throat and trouble swallowing.   Eyes: Negative for itching and visual disturbance.  Respiratory: Negative for cough.   Cardiovascular: Positive for leg swelling. Negative for chest pain and palpitations.  Gastrointestinal: Negative for abdominal distention, blood in stool, diarrhea and nausea.  Genitourinary: Negative for frequency and hematuria.  Musculoskeletal: Positive for gait problem. Negative for back pain, joint swelling and neck pain.  Skin: Negative for rash.  Neurological: Positive for weakness. Negative for dizziness, tremors and speech difficulty.  Hematological: Bruises/bleeds easily.  Psychiatric/Behavioral: Negative for agitation, dysphoric mood and sleep disturbance. The patient is not nervous/anxious.     Objective:  BP 118/80 (BP Location: Right Arm, Patient Position: Sitting, Cuff Size: Large)   Pulse 63   Temp 98.1 F (36.7 C) (Oral)   Ht 6\' 2"  (1.88 m)   Wt 231 lb (104.8 kg)   SpO2 94%   BMI 29.66 kg/m  BP Readings from Last 3 Encounters:  09/23/19 118/80  08/20/19 (!) 109/57  07/28/19 (!) 126/58    Wt Readings from Last 3 Encounters:  09/23/19 231 lb (104.8 kg)  08/20/19 236 lb 12.8 oz (107.4 kg)  07/28/19 247 lb 6 oz (112.2 kg)    Physical Exam Constitutional:      General: He is not in acute distress.    Appearance: He is well-developed.     Comments: NAD  Eyes:     Conjunctiva/sclera: Conjunctivae normal.     Pupils: Pupils are equal, round, and reactive to light.  Neck:     Thyroid: No thyromegaly.      Vascular: No JVD.  Cardiovascular:     Rate and Rhythm: Normal rate. Rhythm irregular.     Heart sounds: Normal heart sounds. No murmur heard.  No friction rub. No gallop.   Pulmonary:     Effort: Pulmonary effort is normal. No respiratory distress.     Breath sounds: Normal breath sounds. No wheezing or rales.  Chest:     Chest wall: No tenderness.  Abdominal:     General: Bowel sounds are normal. There is no distension.     Palpations: Abdomen is soft. There is no mass.     Tenderness: There is no abdominal tenderness. There is no guarding or rebound.  Musculoskeletal:        General: No tenderness. Normal range of motion.     Cervical back: Normal range of motion.  Lymphadenopathy:     Cervical: No cervical adenopathy.  Skin:    General: Skin is warm and dry.     Findings: Bruising present. No rash.  Neurological:     Mental Status: He is alert and oriented to person, place, and time.     Cranial Nerves: No cranial nerve deficit.     Motor: Weakness present. No abnormal muscle tone.     Coordination: Coordination abnormal.     Gait: Gait abnormal.     Deep Tendon Reflexes: Reflexes are normal and symmetric.  Psychiatric:        Behavior: Behavior normal.        Thought Content: Thought content normal.        Judgment: Judgment normal.    Andrew   Lab Results  Component Value Date   WBC 3.2 (L) 08/20/2019   HGB 10.0 (L) 08/20/2019   HCT 31.1 (L) 08/20/2019   HCT 30.5 (L) 08/20/2019   PLT 53 (L) 08/20/2019   GLUCOSE 111 (H) 08/20/2019   CHOL 131 04/21/2019   TRIG 65 04/21/2019   HDL 54 04/21/2019   LDLCALC 64 04/21/2019   ALT 22 08/20/2019   AST 29 08/20/2019   NA 137 08/20/2019   K 4.0 08/20/2019   CL 96 (L) 08/20/2019   CREATININE 2.40 (H) 08/20/2019   BUN 78 (H) 08/20/2019   CO2 26 08/20/2019   TSH 20.834 (H) 07/14/2019   PSA 2.72 09/24/2018   INR 2.8 (A) 09/15/2019   HGBA1C 5.6 03/25/2019   MICROALBUR 6.4 (H) 10/15/2017    No results  found.  Assessment & Plan:    Andrew Kehr, MD

## 2019-09-23 NOTE — Patient Instructions (Addendum)
Description   Spoke to pt instructed pt to continue taking 1 tablet daily except for 1/2 on Thursdays. Call coumadin clinic [phone] for any changes in medications or upcoming procedures.  [facility] to check INR in 2 week.

## 2019-09-23 NOTE — Assessment & Plan Note (Signed)
Coumadin 

## 2019-09-23 NOTE — Assessment & Plan Note (Signed)
Demadex, Spironolactone Was given IV Lasix at home x 2 for wt gain

## 2019-09-23 NOTE — Assessment & Plan Note (Signed)
Monitoring labs 

## 2019-09-23 NOTE — Assessment & Plan Note (Signed)
Pacerone Coumadin

## 2019-09-23 NOTE — Assessment & Plan Note (Signed)
Compression socks Vasculera Demadex and Bumex On weekly RN visits

## 2019-09-23 NOTE — Progress Notes (Signed)
Spoke to [facility] From [facility] who stated that they would be able to go out at check pt's INR on 7/6 and can fax order to the coumadin clinic [phone].

## 2019-10-01 ENCOUNTER — Other Ambulatory Visit: Payer: Self-pay | Admitting: Interventional Cardiology

## 2019-10-07 LAB — POCT INR

## 2019-10-07 LAB — PROTIME-INR: INR: 2.5 — AB (ref 0.9–1.1)

## 2019-10-08 ENCOUNTER — Ambulatory Visit (INDEPENDENT_AMBULATORY_CARE_PROVIDER_SITE_OTHER): Payer: Medicare Other | Admitting: *Deleted

## 2019-10-08 DIAGNOSIS — Z952 Presence of prosthetic heart valve: Secondary | ICD-10-CM | POA: Diagnosis not present

## 2019-10-08 DIAGNOSIS — Z5181 Encounter for therapeutic drug level monitoring: Secondary | ICD-10-CM | POA: Diagnosis not present

## 2019-10-08 DIAGNOSIS — I4891 Unspecified atrial fibrillation: Secondary | ICD-10-CM | POA: Diagnosis not present

## 2019-10-14 ENCOUNTER — Other Ambulatory Visit: Payer: Self-pay

## 2019-10-14 ENCOUNTER — Other Ambulatory Visit (HOSPITAL_COMMUNITY): Payer: Self-pay | Admitting: Surgery

## 2019-10-14 ENCOUNTER — Encounter (HOSPITAL_COMMUNITY): Payer: Self-pay | Admitting: Cardiology

## 2019-10-14 ENCOUNTER — Ambulatory Visit (HOSPITAL_COMMUNITY)
Admission: RE | Admit: 2019-10-14 | Discharge: 2019-10-14 | Disposition: A | Discharge status: Home / Self Care | Payer: Medicare Other | Source: Ambulatory Visit | Attending: Cardiology | Admitting: Cardiology

## 2019-10-14 VITALS — BP 110/58 | HR 57 | Wt 227.0 lb

## 2019-10-14 DIAGNOSIS — Z8249 Family history of ischemic heart disease and other diseases of the circulatory system: Secondary | ICD-10-CM | POA: Diagnosis not present

## 2019-10-14 DIAGNOSIS — Z79899 Other long term (current) drug therapy: Secondary | ICD-10-CM | POA: Diagnosis not present

## 2019-10-14 DIAGNOSIS — J449 Chronic obstructive pulmonary disease, unspecified: Secondary | ICD-10-CM | POA: Diagnosis not present

## 2019-10-14 DIAGNOSIS — G4733 Obstructive sleep apnea (adult) (pediatric): Secondary | ICD-10-CM | POA: Insufficient documentation

## 2019-10-14 DIAGNOSIS — Z951 Presence of aortocoronary bypass graft: Secondary | ICD-10-CM | POA: Insufficient documentation

## 2019-10-14 DIAGNOSIS — E1122 Type 2 diabetes mellitus with diabetic chronic kidney disease: Secondary | ICD-10-CM | POA: Diagnosis not present

## 2019-10-14 DIAGNOSIS — Z7901 Long term (current) use of anticoagulants: Secondary | ICD-10-CM | POA: Diagnosis not present

## 2019-10-14 DIAGNOSIS — I5032 Chronic diastolic (congestive) heart failure: Secondary | ICD-10-CM

## 2019-10-14 DIAGNOSIS — D649 Anemia, unspecified: Secondary | ICD-10-CM | POA: Diagnosis not present

## 2019-10-14 DIAGNOSIS — I482 Chronic atrial fibrillation, unspecified: Secondary | ICD-10-CM | POA: Insufficient documentation

## 2019-10-14 DIAGNOSIS — E039 Hypothyroidism, unspecified: Secondary | ICD-10-CM | POA: Insufficient documentation

## 2019-10-14 DIAGNOSIS — I2510 Atherosclerotic heart disease of native coronary artery without angina pectoris: Secondary | ICD-10-CM | POA: Insufficient documentation

## 2019-10-14 DIAGNOSIS — I5042 Chronic combined systolic (congestive) and diastolic (congestive) heart failure: Secondary | ICD-10-CM | POA: Diagnosis not present

## 2019-10-14 DIAGNOSIS — N1832 Chronic kidney disease, stage 3b: Secondary | ICD-10-CM | POA: Diagnosis not present

## 2019-10-14 DIAGNOSIS — Z952 Presence of prosthetic heart valve: Secondary | ICD-10-CM | POA: Insufficient documentation

## 2019-10-14 DIAGNOSIS — Z87891 Personal history of nicotine dependence: Secondary | ICD-10-CM | POA: Diagnosis not present

## 2019-10-14 LAB — LIPID PANEL
Cholesterol: 112 mg/dL (ref 0–200)
HDL: 51 mg/dL (ref >40)
LDL Cholesterol: 52 mg/dL (ref 0–99)
Total CHOL/HDL Ratio: 2.2 RATIO
Triglycerides: 45 mg/dL (ref <150)
VLDL: 9 mg/dL (ref 0–40)

## 2019-10-14 LAB — CBC
HCT: 30.8 % — ABNORMAL LOW (ref 39.0–52.0)
Hemoglobin: 10.1 g/dL — ABNORMAL LOW (ref 13.0–17.0)
MCH: 37.7 pg — ABNORMAL HIGH (ref 26.0–34.0)
MCHC: 32.8 g/dL (ref 30.0–36.0)
MCV: 114.9 fL — ABNORMAL HIGH (ref 80.0–100.0)
Platelets: 78 10*3/uL — ABNORMAL LOW (ref 150–400)
RBC: 2.68 MIL/uL — ABNORMAL LOW (ref 4.22–5.81)
RDW: 16.5 % — ABNORMAL HIGH (ref 11.5–15.5)
WBC: 4.2 10*3/uL (ref 4.0–10.5)
nRBC: 0 % (ref 0.0–0.2)

## 2019-10-14 LAB — COMPREHENSIVE METABOLIC PANEL
ALT: 18 U/L (ref 0–44)
AST: 31 U/L (ref 15–41)
Albumin: 3.8 g/dL (ref 3.5–5.0)
Alkaline Phosphatase: 91 U/L (ref 38–126)
Anion gap: 15 (ref 5–15)
BUN: 106 mg/dL — ABNORMAL HIGH (ref 8–23)
CO2: 25 mmol/L (ref 22–32)
Calcium: 9.2 mg/dL (ref 8.9–10.3)
Chloride: 90 mmol/L — ABNORMAL LOW (ref 98–111)
Creatinine, Ser: 2.82 mg/dL — ABNORMAL HIGH (ref 0.61–1.24)
GFR calc Af Amer: 23 mL/min — ABNORMAL LOW (ref >60)
GFR calc non Af Amer: 20 mL/min — ABNORMAL LOW (ref >60)
Glucose, Bld: 155 mg/dL — ABNORMAL HIGH (ref 70–99)
Potassium: 4.2 mmol/L (ref 3.5–5.1)
Sodium: 130 mmol/L — ABNORMAL LOW (ref 135–145)
Total Bilirubin: 1.1 mg/dL (ref 0.3–1.2)
Total Protein: 7.5 g/dL (ref 6.5–8.1)

## 2019-10-14 LAB — TSH: TSH: 40.021 u[IU]/mL — ABNORMAL HIGH (ref 0.350–4.500)

## 2019-10-14 MED ORDER — LEVOTHYROXINE SODIUM 50 MCG PO TABS: 75.0000 ug | ORAL_TABLET | Freq: Every day | ORAL | 3 refills | Status: DC

## 2019-10-14 MED ORDER — METOLAZONE 2.5 MG PO TABS: 2.5000 mg | ORAL_TABLET | ORAL | 3 refills | Status: DC

## 2019-10-14 NOTE — Progress Notes (Signed)
Synthroid increased to 75 mcg daily per Dr. [name]

## 2019-10-14 NOTE — Progress Notes (Signed)
Patient given instructions per Dr. [name] to hold Torsemide x 1 day and hold Metolazone x 1 week then restart Torsemide at previous dose and restart Metolazone at once weekly dose.  Patient verbalized understanding.

## 2019-10-14 NOTE — Patient Instructions (Signed)
BE SURE to wear your Bi-Pap at night  It was great to see you today! No medication changes are needed at this time.  Labs today We will only contact you if something comes back abnormal or we need to make some changes. Otherwise no news is good news!  Your physician recommends that you schedule a follow-up appointment in: 6-8 weeks  in the Advanced Practitioners (PA/NP) [facility], you and your health needs are our priority. As part of our continuing mission to provide you with exceptional heart care, we have created designated Provider Care Teams. These Care Teams include your primary Cardiologist (physician) and Advanced Practice Providers (APPs- Physician Assistants and Nurse Practitioners) who all work together to provide you with the care you need, when you need it.   You may see any of the following providers on your designated Care Team at your next follow up: Marland Kitchen Dr Glori Bickers . Dr Loralie Champagne . Darrick Grinder, NP . Lyda Jester, PA . Audry Riles, PharmD   Please be sure to bring in all your medications bottles to every appointment.

## 2019-10-15 NOTE — Progress Notes (Signed)
Advanced Heart Failure Clinic Note   PCP: [name], [name], MD PCP-Cardiologist: [name], MD  HF: [name]  HPI: [name] is a 84 y.o. male with h/o mechanical AVR 1994, OSA on CPAP, Chronic afib, chronic combined CHF, CKD III, chronic coumadin therapy, CAD s/p CAGB 1994, and COPD.   Admitted 7/18-11/05/17 from [facility] office with A/C diastolic HF. Diuresed 83 lbs with lasix drip, metolazone, and diamox. Transitioned to torsemide 80 mg BID. Determined not to be a candidate for MitraClip. Dr [name] also consulted and recommended aggressive medical therapy versus  percutaneous MVR trial as he felt very high risk for conventional surgery. Course complicated by drop in hemoglobin and symptomatic hypotension, requiring 1 unit pRBCs. GI consulted and did not recommend scoping. BB and spiro were stopped due to low BPs. Had some AKI assoicated with low BP, but resolved by discharge. Required lovenox bridge at DC with subtherapeutic INR. Discharged with [facility] [facility]. DC weight: 216 lbs.   Admitted 9/11 - 12/14/17 for dental extractions in setting of work up for MVR. Hospital course complicated by run of VT in the setting of hypokalemia. Started on amiodarone with h/o of arrhythmia. Bridged with lovenox.   Admitted 9/22 - 12/28/17 with symptomatic anemia from bleeding gums. Bleeding stopped with holding heparin and coumadin. Received multiple units of blood.   He was admitted again in 10/19 with left thigh hematoma and went from there to inpatient rehab.   He has been evaluated in [place] for a transcatheter mitral valve.  RHC/LHC was done in 1/20 as part of his pre-op evaluation.  Filling pressures and cardiac output were well-compensated, he had patent SVG-PDA.  Echo in 1/20 showed EF 40-45%, moderate RV dilation with normal systolic function, severe MR with likely rheumatic mitral valve, normal St Jude mechanical aortic valve. Unfortunately, it was determined that he would not qualify for any of  the percutaneous mitral valve studies.    His diuretic doses have been up and down recently based on volume retention and creatinine.   Echo today was reviewed, EF 50-55% with moderate LV dilation, severe MR, mildly decreased RV systolic function, dilated IVC, stable mechanical aortic valve.   He returns for followup of diastolic CHF, mechanical aortic valve, chronic atrial fibrillation, and CAD.  Today, weight is down 20 lbs.  No dyspnea as long as he walks at a slow/steady speed.  He is short of breath walking up an incline or walking fast.  No lightheadedness.  No chest pain.  He uses a walker.  He has not been using his Bipap.     Labs (10/19): hgb 9.2 Labs (11/19): K 3.2, creatinine 1.31 Labs (1/20): K 4.5, creatinine 1.38 Labs (2/20): LDL 64 Labs (6/20): K 4.1, creatinine 1.8, hgb 12.9 Labs (1/21): LDL 64 Labs (2/21): K 4, creatinine 2.9 => 2.47 Labs (3/21): K 4.9, creatinine 3 => 2.92, LFTs normal, elevated TSH with normal free T3 and T4 Labs (5/21): hgb 10, plts 53 Labs (6/21): K 3.7, creatinine 2.16  ROS:  All systems reviewed and negative except as per HPI.    PMH: 1. Carotid stenosis: S/p left CEA in 1994.  2. Atrial fibrillation: Chronic.  He is on warfarin with mechanical aortic valve.  3. Type II diabetes.  4. COPD: PFTs (11/19) with moderate obstructive airways disease and severely decreased DLCO.  5. OSA: Bipap recommended 6. 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  7. ITP 8. VT: On amiodarone.  9. CKD: Stage  3.  10. 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  11. Chronic systolic CHF: TEE (9/67) with EF 55-60%, D-shaped septum, mildly dilated RV with mildly decreased systolic function, mechanical aortic valve, rheumatic-appearing mitral valve with severe MR, minimal stenosis.  - RHC (1/20): mean RA 6, PA 54/20, mean PCWP 15, CI 3.42, PVR 2.1 WU, PAPI 5.2 - Echo  (1/20): EF 40-45%, moderate RV dilation with normal systolic function, severe biatrial enlargement, severe rheumatic MR, St Jude mechanical aortic valve functioning normally.  - Echo (3/21): EF 50-55% with moderate LV dilation, severe MR, mildly decreased RV systolic function, dilated IVC, stable mechanical aortic valve.  12. ABIs (12/18): Normal.  13. CAD: S/p CABG 1994.  - LHC (1/20): 50% D1, 60% ostial LCx, totally occluded RCA with patent SVG-PDA.     Current Outpatient Medications  Medication Sig Dispense Refill   acetaminophen (TYLENOL) 500 MG tablet Take 1,000 mg by mouth every 6 (six) hours as needed for moderate pain or headache.     albuterol (PROAIR HFA) 108 (90 Base) MCG/ACT inhaler Inhale 2 puffs into the lungs every 4 (four) hours as needed for wheezing or shortness of breath. 18 g 5   allopurinol (ZYLOPRIM) 100 MG tablet TAKE 1 TABLET BY MOUTH EVERY DAY 90 tablet 3   amiodarone (PACERONE) 200 MG tablet Take 1 tablet (200 mg total) by mouth daily. 90 tablet 3   carvedilol (COREG) 3.125 MG tablet Take 1 tablet (3.125 mg total) by mouth 2 (two) times daily. 180 tablet 3   ezetimibe-simvastatin (VYTORIN) 10-20 MG tablet TAKE 1 TABLET BY MOUTH EVERYDAY AT BEDTIME 90 tablet 3   finasteride (PROSCAR) 5 MG tablet Take 5 mg by mouth daily.     polyethylene glycol powder (GLYCOLAX/MIRALAX) powder Take 17 g by mouth daily as needed for moderate constipation.     potassium chloride SA (KLOR-CON M20) 20 MEQ tablet Take 60 meq (3 tabs) in AM, 40 meq (2 tabs) at noon and 40 meq (2 tabs) in PM 630 tablet 1   spironolactone (ALDACTONE) 25 MG tablet Take 25 mg by mouth daily.     tamsulosin (FLOMAX) 0.4 MG CAPS capsule Take 0.4 mg by mouth daily after supper.      torsemide (DEMADEX) 100 MG tablet Take 1 tablet (100 mg total) by mouth 2 (two) times daily. 180 tablet 3   triamcinolone ointment (KENALOG) 0.1 % Apply 1 application topically 2 (two) times daily. (Patient taking  differently: Apply 1 application topically daily as needed. ) 80 g 1   warfarin (COUMADIN) 5 MG tablet TAKE  AS DIRECTED BY COUMADIN CLINIC 100 tablet 1   levothyroxine (SYNTHROID) 50 MCG tablet Take 1.5 tablets (75 mcg total) by mouth daily before breakfast. 90 tablet 3   metolazone (ZAROXOLYN) 2.5 MG tablet Take 1 tablet (2.5 mg total) by mouth once a week. 38 tablet 3   No current facility-administered medications for this encounter.   Allergies  Allergen Reactions   Rifampin Rash and Other (See Comments)    May have been caused by Vancomycin or Rifampin (??)   Vancomycin Rash and Other (See Comments)    May have been caused by Vancomycin or Rifampin (??)   Social History   Socioeconomic History   Marital status: Married    Spouse name: Not on file   Number of children: 3   Years of education: Not on file   Highest education level: Not on  file  Occupational History   Not on file  Tobacco Use   Smoking status: Former Smoker    Packs/day: 1.00    Years: 30.00    Pack years: 30.00    Types: Cigarettes    Quit date: 04/03/1992    Years since quitting: 27.5   Smokeless tobacco: Never Used  Vaping Use   Vaping Use: Never used  Substance and Sexual Activity   Alcohol use: Yes    Comment: 4-6 ounces of wine daily   Drug use: No    Comment: Half a cup a day.   Sexual activity: Not on file  Other Topics Concern   Not on file  Social History Narrative   Patient is married and lives with his wife.   Youngest daughter lives in [place] also.   Patient has 2 other children.   Social Determinants of Health   Financial Resource Strain:    Difficulty of Paying Living Expenses:   Food Insecurity:    Worried About Charity fundraiser in the Last Year:    Arboriculturist in the Last Year:   Transportation Needs:    Film/video editor (Medical):    Lack of Transportation (Non-Medical):   Physical Activity:    Days of Exercise per Week:    Minutes  of Exercise per Session:   Stress:    Feeling of Stress :   Social Connections:    Frequency of Communication with Friends and Family:    Frequency of Social Gatherings with Friends and Family:    Attends Religious Services:    Active Member of Clubs or Organizations:    Attends Archivist Meetings:    Marital Status:   Intimate Partner Violence:    Fear of Current or Ex-Partner:    Emotionally Abused:    Physically Abused:    Sexually Abused:     Family History  Problem Relation Age of Onset   Cancer Father        lung    Hypertension Mother    Vitals:   10/14/19 1017  BP: (!) 110/58  Pulse: (!) 57  SpO2: 98%  Weight: 103 kg (227 lb)     Wt Readings from Last 3 Encounters:  10/14/19 103 kg (227 lb)  09/23/19 104.8 kg (231 lb)  08/20/19 107.4 kg (236 lb 12.8 oz)   PHYSICAL EXAM: General: NAD Neck: No JVD, no thyromegaly or thyroid nodule.  Lungs: Clear to auscultation bilaterally with normal respiratory effort. CV: Nondisplaced PMI.  Heart regular S1/S2 with mechanical S2, no S3/S4, 3/6 HSM apex.  Trace ankle edema.  No carotid bruit.  Normal pedal pulses.  Abdomen: Soft, nontender, no hepatosplenomegaly, no distention.  Skin: Intact without lesions or rashes.  Neurologic: Alert and oriented x 3.  Psych: Normal affect. Extremities: No clubbing or cyanosis.  HEENT: Normal.   ASSESSMENT & PLAN: 1. Chronic systolic CHF with prominent RV failure:  Echo in 3/21 with EF 50-55%, mildly decreased RV systolic function, severe rheumatic mitral regurgitation, normal mechanical aortic valve.  Severe MR likely plays a significant role in CHF and RV failure. NYHA class III.  His weight is down 20 lbs and he is not volume overloaded on exam.  I am concerned that his creatinine may be up some with significant weight loss.  - Continue torsemide 100 mg bid.  - He is off spironolactone with elevated creatinine.  - Continue metolazone 2.5 mg biw, but may need to  cut back  if creatinine is up.  Check BMET today.  - Continue Coreg 3.125 mg bid.  2. Mechanical aortic valve: Appeared to function well on last echo.  - Continue warfarin with INR goal 2.5-3.5 (also with atrial fibrillation).  - He is off aspirin with spontaneous thigh hematoma.   3. Atrial fibrillation: Chronic, rate is controlled.  4. CAD: s/p CABG.  No chest pain.  Cath in 1/20 with patent SVG-PDA, no interventional target.  - No ASA given stable CAD with warfarin use.   - Continue Vytorin, good lipids in 1/21.  5. COPD: He is no longer using oxygen during the day.  Moderate COPD on 11/19 PFTs.   6. OSA: He is not using Bipap, needs to restart.  7.CKD Stage 3b: BMET today.  8. Thrombocytopenia: Chronic, has been mild/stable.  CBC today.  9. Severe MR: TEE 7/19 with severe MR with possible rheumatic MV (do not think MS is significant, mildly elevated mean gradient from high flow with severe MR).  Structural Heart team evaluated, not a Mitraclip candidate. Seen by Dr. [name], would be high risk for open surgery.  He was evaluated at [facility] in [place] for percutaneous mitral valve trials.  Unfortunately, he does not qualify for any of the available trials.  We will have to continue medical management.  10. VT: He is on amiodarone to suppress VT.  - Check LFTs.  Hypothyroidism followed by PCP.  Will need regular eye exam on amiodarone.  11. Anemia: Melena noted when INR was 10, now resolved.  - CBC today.   Followup with NP/PA in 6 wks.   Loralie Champagne 10/15/2019

## 2019-10-28 ENCOUNTER — Ambulatory Visit (INDEPENDENT_AMBULATORY_CARE_PROVIDER_SITE_OTHER): Payer: Medicare Other | Admitting: *Deleted

## 2019-10-28 ENCOUNTER — Other Ambulatory Visit (HOSPITAL_COMMUNITY): Payer: Self-pay | Admitting: Cardiology

## 2019-10-28 ENCOUNTER — Telehealth: Payer: Self-pay | Admitting: *Deleted

## 2019-10-28 DIAGNOSIS — I4891 Unspecified atrial fibrillation: Secondary | ICD-10-CM

## 2019-10-28 DIAGNOSIS — Z952 Presence of prosthetic heart valve: Secondary | ICD-10-CM

## 2019-10-28 DIAGNOSIS — I480 Paroxysmal atrial fibrillation: Secondary | ICD-10-CM | POA: Diagnosis not present

## 2019-10-28 DIAGNOSIS — I5032 Chronic diastolic (congestive) heart failure: Secondary | ICD-10-CM | POA: Diagnosis not present

## 2019-10-28 DIAGNOSIS — Z7901 Long term (current) use of anticoagulants: Secondary | ICD-10-CM | POA: Diagnosis not present

## 2019-10-28 DIAGNOSIS — I482 Chronic atrial fibrillation, unspecified: Secondary | ICD-10-CM

## 2019-10-28 LAB — PROTIME-INR: INR: 3 — AB (ref 0.9–1.1)

## 2019-10-28 NOTE — Patient Instructions (Signed)
Description   Spoke to pt instructed pt to continue taking Warfarin 1 tablet daily except for 1/2 tablet on Thursdays. Call Coumadin Clinic [phone] for any changes in medications or upcoming procedures.  [facility] to check INR in 4 weeks.

## 2019-10-28 NOTE — Telephone Encounter (Signed)
[facility] Visit Follow Up Request   Date of Request ([place]):  October 28, 2019  Requesting Provider:   [name]   Agency Requested:    [facility] Contact:  Glory Buff, NP [address] [place], [place] [address] Phone #:  [phone] Fax #:  [phone]  Patient Demographic Information: Name:  [name] Age:  84 y.o.   DOB:  1935/10/15  MRN:  [id number]   Home visit progress note(s), lab results, telemetry strips, etc were reviewed.  Provider Recommendations: PT/INR on 11/25/2019  Follow up home services requested:  Labs: PT/INR  All labs ordered for this home visit have been released and the request was sent to [name] at [facility].

## 2019-10-31 ENCOUNTER — Other Ambulatory Visit: Payer: Medicare Other

## 2019-10-31 ENCOUNTER — Ambulatory Visit: Payer: Medicare Other | Admitting: Hematology

## 2019-11-11 NOTE — Progress Notes (Signed)
[place]   Hematology Clinic Note  Date of service: 08/01/17   Patient Care Team: Cassandria Anger, MD as PCP - General (Internal Medicine) Belva Crome, MD as PCP - Cardiology (Cardiology) Sueanne Margarita, MD as PCP - Sleep Medicine (Sleep Medicine) Brunetta Genera, MD as Consulting Physician (Hematology) Belva Crome, MD as Consulting Physician (Cardiology) Elayne Snare, MD as Consulting Physician (Endocrinology) Lavonna Monarch, MD as Consulting Physician (Dermatology)  CHIEF COMPLAINTS: follow-up for Macrocytosis and Thrombocytopenia.  HISTORY OF PRESENTING ILLNESS: Please see my initial note for details on initial presentation.  Interval history:  [name] is here for follow-up regarding his thrombocytopenia. We are joined today by his wife. The patient's last visit with [place] was on 08/29/2019. The pt reports that he is doing well overall.  The pt reports that he is having to urinate frequently, especially at night. His Coumadin levels have been stable and he denies any bleeding concerns. The intensity of his fatigue fluctuates, but he believes that it is improved overall.   Lab results today (11/12/19) of CBC w/diff and CMP is as follows: all values are WNL except for RBC at 2.52, Hgb at 9.7, HCT at 29.0, MCV at 115.1, MCH at 38.5, RDW at 16.6, PLT at 104K, Lymphs Abs at 0.5K, Chloride at 96, Glucose at 128, BUN at 88, Creatinine at 3.05, GFR Est Non Af Am at 18. 11/12/2019 Ferritin at 285 11/12/2019 Vitamin B12 at 874 11/12/2019 Immature PLT Fract at 0.8 11/12/2019 Iron Panel is as follows: Iron at 90, TIBC at 348, Sat Ratios at 26, UIBC at 258 11/12/2019 Reticulocytes is as follows: Retic Ct Pct at 5.1, RBC at 2.53, Retic Ct Abs at 129.0, Immature Retic Fract at 26.4  On review of systems, pt reports polyuria, fatigue and denies abnormal/excessive bleeding, leg swelling, abdominal pain, constipation and any other symptoms.   MEDICAL HISTORY:   Past Medical History:  Diagnosis Date  . Carotid artery disease ([place]) 1994   s/p left carotid endarerectomy   . Chronic atrial fibrillation (HCC)    a. on coumadin   . Chronic diastolic CHF (congestive heart failure) ([place])   . Diabetes mellitus without complication ([place])    dx 2016  . Dyspnea   . Epistaxis   . Heart murmur   . Hx of CABG    a. 1994  . Hypertension   . OSA (obstructive sleep apnea)   . Rheumatic fever   . S/P AVR (aortic valve replacement)    a. mechanical valve 1996  . Subclavian bypass stenosis ([place])   . 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 Vitamin B12 deficiency     SURGICAL HISTORY: Past Surgical History:  Procedure Laterality Date  . artificial valve    . [place]   replaced due to aortic stenosis, [employer] mechanical prostesis  . CAROTID ENDARTERECTOMY Left 1997   subclavian bypass Done in [place]  . CORONARY ARTERY BYPASS GRAFT  1994   w SVG to RCA and SVG to circumflex  . heart bypass     Done in [place]  . MULTIPLE EXTRACTIONS WITH ALVEOLOPLASTY Bilateral 12/12/2017   Procedure: Extraction of tooth #'s 1, 12,13,14,15,17,18,19, 29, and 30 with alveoloplasty and gross debridement of remaining teeth`;  Surgeon: Lenn Cal, DDS;  Location: [facility];  Service: Oral Surgery;  Laterality: Bilateral;  MULTIPLE EXTRACTION WITH ALVEOLOPLASTY WITH PRE PROSTHETIC SURGERY AND  GROSS DEBRIDEMENT OF REMAINING TEETH  . RIGHT HEART CATH AND CORONARY/GRAFT ANGIOGRAPHY N/A 04/30/2018   Procedure: RIGHT HEART CATH AND CORONARY/GRAFT ANGIOGRAPHY;  Surgeon: Larey Dresser, MD;  Location: [facility];  Service: Cardiovascular;  Laterality: N/A;  . TEE WITHOUT CARDIOVERSION N/A 10/23/2017   Procedure: TRANSESOPHAGEAL ECHOCARDIOGRAM (TEE);  Surgeon: Larey Dresser, MD;  Location: [facility];  Service: Cardiovascular;  Laterality: N/A;  . TONSILLECTOMY      SOCIAL  HISTORY: Social History   Socioeconomic History  . Marital status: Married    Spouse name: Not on file  . Number of children: 3  . Years of education: Not on file  . Highest education level: Not on file  Occupational History  . Not on file  Tobacco Use  . Smoking status: Former Smoker    Packs/day: 1.00    Years: 30.00    Pack years: 30.00    Types: Cigarettes    Quit date: 04/03/1992    Years since quitting: 27.6  . Smokeless tobacco: Never Used  Vaping Use  . Vaping Use: Never used  Substance and Sexual Activity  . Alcohol use: Yes    Comment: 4-6 ounces of wine daily  . Drug use: No    Comment: Half a cup a day.  Marland Kitchen Sexual activity: Not on file  Other Topics Concern  . Not on file  Social History Narrative   Patient is married and lives with his wife.   Youngest daughter lives in [place] also.   Patient has 2 other children.   Social Determinants of Health   Financial Resource Strain:   . Difficulty of Paying Living Expenses:   Food Insecurity:   . Worried About Charity fundraiser in the Last Year:   . Arboriculturist in the Last Year:   Transportation Needs:   . Film/video editor (Medical):   Marland Kitchen Lack of Transportation (Non-Medical):   Physical Activity:   . Days of Exercise per Week:   . Minutes of Exercise per Session:   Stress:   . Feeling of Stress :   Social Connections:   . Frequency of Communication with Friends and Family:   . Frequency of Social Gatherings with Friends and Family:   . Attends Religious Services:   . Active Member of Clubs or Organizations:   . Attends Archivist Meetings:   Marland Kitchen Marital Status:   Intimate Partner Violence:   . Fear of Current or Ex-Partner:   . Emotionally Abused:   Marland Kitchen Physically Abused:   . Sexually Abused:     FAMILY HISTORY: Family History  Problem Relation Age of Onset  . Cancer Father        lung   . Hypertension Mother     ALLERGIES:  is allergic to rifampin and  vancomycin.  MEDICATIONS:  Current Outpatient Medications  Medication Sig Dispense Refill  . acetaminophen (TYLENOL) 500 MG tablet Take 1,000 mg by mouth every 6 (six) hours as needed for moderate pain or headache.    . albuterol (PROAIR HFA) 108 (90 Base) MCG/ACT inhaler Inhale 2 puffs into the lungs every 4 (four) hours as needed for wheezing or shortness of breath. 18 g 5  . allopurinol (ZYLOPRIM) 100 MG tablet TAKE 1 TABLET BY MOUTH EVERY DAY 90 tablet 3  . amiodarone (PACERONE) 200 MG tablet Take 1 tablet (200 mg total) by mouth daily. 90 tablet 3  . carvedilol (COREG) 3.125 MG tablet Take 1 tablet (  3.125 mg total) by mouth 2 (two) times daily. 180 tablet 3  . ezetimibe-simvastatin (VYTORIN) 10-20 MG tablet TAKE 1 TABLET BY MOUTH EVERYDAY AT BEDTIME 90 tablet 3  . finasteride (PROSCAR) 5 MG tablet Take 5 mg by mouth daily.    Marland Kitchen levothyroxine (SYNTHROID) 50 MCG tablet Take 1.5 tablets (75 mcg total) by mouth daily before breakfast. 90 tablet 3  . metolazone (ZAROXOLYN) 2.5 MG tablet Take 1 tablet (2.5 mg total) by mouth once a week. 38 tablet 3  . polyethylene glycol powder (GLYCOLAX/MIRALAX) powder Take 17 g by mouth daily as needed for moderate constipation.    . potassium chloride SA (KLOR-CON M20) 20 MEQ tablet Take 60 meq (3 tabs) in AM, 40 meq (2 tabs) at noon and 40 meq (2 tabs) in PM 630 tablet 1  . spironolactone (ALDACTONE) 25 MG tablet Take 25 mg by mouth daily.    . tamsulosin (FLOMAX) 0.4 MG CAPS capsule Take 0.4 mg by mouth daily after supper.     . torsemide (DEMADEX) 100 MG tablet Take 1 tablet (100 mg total) by mouth 2 (two) times daily. 180 tablet 3  . triamcinolone ointment (KENALOG) 0.1 % Apply 1 application topically 2 (two) times daily. (Patient taking differently: Apply 1 application topically daily as needed. ) 80 g 1  . warfarin (COUMADIN) 5 MG tablet TAKE  AS DIRECTED BY COUMADIN CLINIC 100 tablet 1   No current facility-administered medications for this visit.     REVIEW OF SYSTEMS:   A 10+ POINT REVIEW OF SYSTEMS WAS OBTAINED including neurology, dermatology, psychiatry, cardiac, respiratory, lymph, extremities, GI, GU, Musculoskeletal, constitutional, breasts, reproductive, HEENT.  All pertinent positives are noted in the HPI.  All others are negative.   PHYSICAL EXAMINATION: ECOG PERFORMANCE STATUS: 1 - Symptomatic but completely ambulatory  Vitals:   11/12/19 0854  BP: (!) 99/48  Pulse: (!) 57  Resp: 18  Temp: (!) 97.1 F (36.2 C)  SpO2: 98%   Filed Weights   11/12/19 0854  Weight: 226 lb 1.6 oz (102.6 kg)    Exam was given in a wheelchair   GENERAL:alert, in no acute distress and comfortable SKIN: no acute rashes, no significant lesions EYES: conjunctiva are pink and non-injected, sclera anicteric OROPHARYNX: MMM, no exudates, no oropharyngeal erythema or ulceration NECK: supple, no JVD LYMPH:  no palpable lymphadenopathy in the cervical, axillary or inguinal regions LUNGS: clear to auscultation b/l with normal respiratory effort HEART: regular rate & rhythm ABDOMEN:  normoactive bowel sounds , non tender, not distended. No palpable hepatosplenomegaly.  Extremity: minimal pedal edema PSYCH: alert & oriented x 3 with fluent speech NEURO: no focal motor/sensory deficits  LABORATORY DATA:   . CBC Latest Ref Rng & Units 11/12/2019 10/14/2019 08/20/2019  WBC 4.0 - 10.5 K/uL 4.2 4.2 3.2(L)  Hemoglobin 13.0 - 17.0 g/dL 9.7(L) 10.1(L) 10.0(L)  Hematocrit 39 - 52 % 29.0(L) 30.8(L) 30.5(L)  Platelets 150 - 400 K/uL 104(L) 78(L) 53(L)   . CBC    Component Value Date/Time   WBC 4.2 11/12/2019 0820   RBC 2.53 (L) 11/12/2019 0820   RBC 2.52 (L) 11/12/2019 0820   HGB 9.7 (L) 11/12/2019 0820   HGB 9.3 (L) 08/01/2017 1136   HGB 11.6 (L) 03/30/2017 1147   HGB 12.0 (L) 01/19/2017 0827   HCT 29.0 (L) 11/12/2019 0820   HCT 31.1 (L) 08/20/2019 1105   HCT 37.8 (L) 01/19/2017 0827   PLT 104 (L) 11/12/2019 0820   PLT 56 (L) 08/01/2017  1136   PLT 59 (LL) 03/30/2017 1147   MCV 115.1 (H) 11/12/2019 0820   MCV 106 (H) 03/30/2017 1147   MCV 113.9 (H) 01/19/2017 0827   MCH 38.5 (H) 11/12/2019 0820   MCHC 33.4 11/12/2019 0820   RDW 16.6 (H) 11/12/2019 0820   RDW 14.5 03/30/2017 1147   RDW 16.2 (H) 01/19/2017 0827   LYMPHSABS 0.5 (L) 11/12/2019 0820   LYMPHSABS 0.8 (L) 01/19/2017 0827   MONOABS 0.5 11/12/2019 0820   MONOABS 0.3 01/19/2017 0827   EOSABS 0.1 11/12/2019 0820   EOSABS 0.1 01/19/2017 0827   BASOSABS 0.0 11/12/2019 0820   BASOSABS 0.0 01/19/2017 0827    . CMP Latest Ref Rng & Units 11/12/2019 10/14/2019 08/20/2019  Glucose 70 - 99 mg/dL 128(H) 155(H) 111(H)  BUN 8 - 23 mg/dL 88(H) 106(H) 78(H)  Creatinine 0.61 - 1.24 mg/dL 3.05(HH) 2.82(H) 2.40(H)  Sodium 135 - 145 mmol/L 135 130(L) 137  Potassium 3.5 - 5.1 mmol/L 5.0 4.2 4.0  Chloride 98 - 111 mmol/L 96(L) 90(L) 96(L)  CO2 22 - 32 mmol/L 25 25 26   Calcium 8.9 - 10.3 mg/dL 10.1 9.2 9.2  Total Protein 6.5 - 8.1 g/dL 7.9 7.5 7.4  Total Bilirubin 0.3 - 1.2 mg/dL 0.9 1.1 1.4(H)  Alkaline Phos 38 - 126 U/L 114 91 142(H)  AST 15 - 41 U/L 25 31 29   ALT 0 - 44 U/L 14 18 22     RADIOGRAPHIC STUDIES: I have personally reviewed the radiological images as listed and agreed with the findings in the report. No results found.  ASSESSMENT & PLAN   1) Thrombocytopenia. Moderate. Platelet counts are stable and the same ballpark in 60-70k  range for the last >1 year, early 2018. 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 + cannot rule out ITP element. Could have possible element of MDS. He has some pelgeroid neutrophils on his peripheral blood smear that are suggestive of possible MDS. Hepatitis B, and C serologies negative. No evidence of a lymphoproliferative syndrome on peripheral blood smear or clinical examination. SPEP showed no monoclonal protein.  Plan - patient's platelets  remain low but stable at 53k . -If platelet counts drop below 50K might need to limit the INR fluctuations on Coumadin from 2-3 more strictly. -continue absolute alcohol cessation -Avoid NSAIDS and other medications known to cause thrombocytopenia especially ranitidine and other  H2 blockers, sulfa drugs, PPIs. -continue vitamin B12 2000 g sublingually daily and Folic acid 1 mg by mouth daily to support accelerated hematopoeisis with valve hemolysis. -Coumadin monitoring as per cardiology clinic -if plts <50k (due to anticoagulation) or if bleeding issues might need to consider Nplate/Promacta.  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 Has somewhat elevated LDH confirms the presence of low-grade hemolysis. 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  PLAN: -Discussed pt labwork today, 11/12/19; Hgb is stable, PLT is improved, kidney numbers are worsening, Immature PLT Fract is suppressed, Iron Panel is all WNL, Ferritin is good, Vitamin B12 is well-replaced -Elevated kidney numbers could be a reflection of dehydration. Advised pt that his diuretics will need to be watched closely to prevent dehydration and fluid overload.  -Advised pt that due to CKD (which is contributing to his anemia) we would keep Ferritin levels high (>200). Will replace Iron as needed.  -Recommend pt f/u with Cardiology and Nephrology for Coumadin and Lasix management  -Advised  pt that if kidney function continues to worsen there may be a role for ESAs -Continue avoiding NSAIDs and any other medications that could drop PLT -Continue Vitamin B-complex and Folic acid daily  -Will see back in 6 months with labs    FOLLOW UP: RTC with Dr Irene Limbo with labs in 6 months   The total time spent in the appt was 20 minutes and more than 50% was on counseling and direct patient cares.  All of the patient's questions were answered with apparent  satisfaction. The patient knows to call the clinic with any problems, questions or concerns.  Andrew Lone MD [place] [facility]/Oncology Physician [facility]  (Office):       [phone] (Work cell):  [phone] (Fax):           [phone]  I, Yevette Edwards, am acting as a scribe for Dr. Sullivan Beard.   .I have reviewed the above documentation for accuracy and completeness, and I agree with the above. Brunetta Genera MD

## 2019-11-12 ENCOUNTER — Other Ambulatory Visit: Payer: Self-pay

## 2019-11-12 ENCOUNTER — Inpatient Hospital Stay: Payer: Medicare Other | Attending: Hematology

## 2019-11-12 ENCOUNTER — Inpatient Hospital Stay (HOSPITAL_BASED_OUTPATIENT_CLINIC_OR_DEPARTMENT_OTHER): Payer: Medicare Other | Admitting: Hematology

## 2019-11-12 VITALS — BP 99/48 | HR 57 | Temp 97.1°F | Resp 18 | Ht 74.0 in | Wt 226.1 lb

## 2019-11-12 DIAGNOSIS — D696 Thrombocytopenia, unspecified: Secondary | ICD-10-CM | POA: Diagnosis not present

## 2019-11-12 DIAGNOSIS — D649 Anemia, unspecified: Secondary | ICD-10-CM

## 2019-11-12 DIAGNOSIS — N189 Chronic kidney disease, unspecified: Secondary | ICD-10-CM | POA: Insufficient documentation

## 2019-11-12 DIAGNOSIS — D72819 Decreased white blood cell count, unspecified: Secondary | ICD-10-CM

## 2019-11-12 DIAGNOSIS — D631 Anemia in chronic kidney disease: Secondary | ICD-10-CM | POA: Diagnosis not present

## 2019-11-12 LAB — CBC WITH DIFFERENTIAL/PLATELET
Abs Immature Granulocytes: 0.04 K/uL (ref 0.00–0.07)
Basophils Absolute: 0 K/uL (ref 0.0–0.1)
Basophils Relative: 0 %
Eosinophils Absolute: 0.1 K/uL (ref 0.0–0.5)
Eosinophils Relative: 2 %
HCT: 29 % — ABNORMAL LOW (ref 39.0–52.0)
Hemoglobin: 9.7 g/dL — ABNORMAL LOW (ref 13.0–17.0)
Immature Granulocytes: 1 %
Lymphocytes Relative: 11 %
Lymphs Abs: 0.5 K/uL — ABNORMAL LOW (ref 0.7–4.0)
MCH: 38.5 pg — ABNORMAL HIGH (ref 26.0–34.0)
MCHC: 33.4 g/dL (ref 30.0–36.0)
MCV: 115.1 fL — ABNORMAL HIGH (ref 80.0–100.0)
Monocytes Absolute: 0.5 K/uL (ref 0.1–1.0)
Monocytes Relative: 11 %
Neutro Abs: 3.2 K/uL (ref 1.7–7.7)
Neutrophils Relative %: 75 %
Platelets: 104 K/uL — ABNORMAL LOW (ref 150–400)
RBC: 2.52 MIL/uL — ABNORMAL LOW (ref 4.22–5.81)
RDW: 16.6 % — ABNORMAL HIGH (ref 11.5–15.5)
WBC: 4.2 K/uL (ref 4.0–10.5)
nRBC: 0 % (ref 0.0–0.2)

## 2019-11-12 LAB — RETICULOCYTES
Immature Retic Fract: 26.4 % — ABNORMAL HIGH (ref 2.3–15.9)
RBC.: 2.53 MIL/uL — ABNORMAL LOW (ref 4.22–5.81)
Retic Count, Absolute: 129 10*3/uL (ref 19.0–186.0)
Retic Ct Pct: 5.1 % — ABNORMAL HIGH (ref 0.4–3.1)

## 2019-11-12 LAB — CMP (CANCER CENTER ONLY)
ALT: 14 U/L (ref 0–44)
AST: 25 U/L (ref 15–41)
Albumin: 3.6 g/dL (ref 3.5–5.0)
Alkaline Phosphatase: 114 U/L (ref 38–126)
Anion gap: 14 (ref 5–15)
BUN: 88 mg/dL — ABNORMAL HIGH (ref 8–23)
CO2: 25 mmol/L (ref 22–32)
Calcium: 10.1 mg/dL (ref 8.9–10.3)
Chloride: 96 mmol/L — ABNORMAL LOW (ref 98–111)
Creatinine: 3.05 mg/dL (ref 0.61–1.24)
GFR, Est AFR Am: 21 mL/min — ABNORMAL LOW
GFR, Estimated: 18 mL/min — ABNORMAL LOW
Glucose, Bld: 128 mg/dL — ABNORMAL HIGH (ref 70–99)
Potassium: 5 mmol/L (ref 3.5–5.1)
Sodium: 135 mmol/L (ref 135–145)
Total Bilirubin: 0.9 mg/dL (ref 0.3–1.2)
Total Protein: 7.9 g/dL (ref 6.5–8.1)

## 2019-11-12 LAB — FERRITIN: Ferritin: 285 ng/mL (ref 24–336)

## 2019-11-12 LAB — IRON AND TIBC
Iron: 90 ug/dL (ref 42–163)
Saturation Ratios: 26 % (ref 20–55)
TIBC: 348 ug/dL (ref 202–409)
UIBC: 258 ug/dL (ref 117–376)

## 2019-11-12 LAB — IMMATURE PLATELET FRACTION: Immature Platelet Fraction: 0.8 % — ABNORMAL LOW (ref 1.2–8.6)

## 2019-11-12 LAB — VITAMIN B12: Vitamin B-12: 874 pg/mL (ref 180–914)

## 2019-11-12 NOTE — Progress Notes (Signed)
CRITICAL VALUE STICKER  CRITICAL VALUE: Scr. 3.05  RECEIVER (on-site recipient of call): Alec Jaros-O, [place] NOTIFIED: 11/12/19 @ 0915  MESSENGER (representative from lab): Suanne Marker  MD NOTIFIED: Sullivan Lone  TIME OF NOTIFICATION: 0917  RESPONSE: MD to address during visit with patient today.

## 2019-11-25 DIAGNOSIS — Z7901 Long term (current) use of anticoagulants: Secondary | ICD-10-CM | POA: Diagnosis not present

## 2019-11-26 ENCOUNTER — Telehealth: Payer: Self-pay | Admitting: Pharmacist

## 2019-11-26 ENCOUNTER — Ambulatory Visit (INDEPENDENT_AMBULATORY_CARE_PROVIDER_SITE_OTHER): Payer: Medicare Other | Admitting: Internal Medicine

## 2019-11-26 DIAGNOSIS — Z5181 Encounter for therapeutic drug level monitoring: Secondary | ICD-10-CM

## 2019-11-26 DIAGNOSIS — Z952 Presence of prosthetic heart valve: Secondary | ICD-10-CM

## 2019-11-26 DIAGNOSIS — I482 Chronic atrial fibrillation, unspecified: Secondary | ICD-10-CM

## 2019-11-26 LAB — PROTIME-INR: INR: 5.4 — AB (ref 0.9–1.1)

## 2019-11-26 NOTE — Telephone Encounter (Addendum)
[facility] Visit Follow Up Request   Date of Request ([place]):  November 26, 2019  Requesting Provider:   Dr Daneen Schick   Agency Requested:    [facility] Contact:  Glory Buff, NP [address] [place], [place] [address] Phone #:  [phone] Fax #:  [phone]  Patient Demographic Information: Name:  [name] Age:  84 y.o.   DOB:  08/06/1935  MRN:  [id number]   Home visit progress note(s), lab results, telemetry strips, etc were reviewed.  Provider Recommendations: PT/INR on 12/10/2019  Follow up home services requested:  Labs: PT/INR  All labs ordered for this home visit have been released and the request was sent to [name] at [facility].

## 2019-11-26 NOTE — Patient Instructions (Signed)
Description   Spoke to pt instructed pt to continue taking Warfarin 1 tablet daily except for 1/2 tablet on Thursdays. Call Coumadin Clinic [phone] for any changes in medications or upcoming procedures.  [facility] to check INR in 4 weeks.

## 2019-11-28 DIAGNOSIS — Z7901 Long term (current) use of anticoagulants: Secondary | ICD-10-CM | POA: Diagnosis not present

## 2019-11-28 DIAGNOSIS — N184 Chronic kidney disease, stage 4 (severe): Secondary | ICD-10-CM | POA: Diagnosis not present

## 2019-11-28 DIAGNOSIS — I1310 Hypertensive heart and chronic kidney disease without heart failure, with stage 1 through stage 4 chronic kidney disease, or unspecified chronic kidney disease: Secondary | ICD-10-CM | POA: Diagnosis not present

## 2019-11-28 DIAGNOSIS — I5042 Chronic combined systolic (congestive) and diastolic (congestive) heart failure: Secondary | ICD-10-CM | POA: Diagnosis not present

## 2019-11-28 DIAGNOSIS — D631 Anemia in chronic kidney disease: Secondary | ICD-10-CM | POA: Diagnosis not present

## 2019-12-02 ENCOUNTER — Other Ambulatory Visit: Payer: Self-pay

## 2019-12-02 ENCOUNTER — Telehealth (HOSPITAL_COMMUNITY): Payer: Self-pay | Admitting: Cardiology

## 2019-12-02 ENCOUNTER — Encounter (HOSPITAL_COMMUNITY): Payer: Self-pay

## 2019-12-02 ENCOUNTER — Ambulatory Visit (HOSPITAL_COMMUNITY)
Admission: RE | Admit: 2019-12-02 | Discharge: 2019-12-02 | Disposition: A | Discharge status: Home / Self Care | Payer: Medicare Other | Source: Ambulatory Visit | Attending: Cardiology | Admitting: Cardiology

## 2019-12-02 VITALS — BP 102/60 | HR 57 | Ht 74.0 in | Wt 219.0 lb

## 2019-12-02 DIAGNOSIS — N1832 Chronic kidney disease, stage 3b: Secondary | ICD-10-CM | POA: Insufficient documentation

## 2019-12-02 DIAGNOSIS — I482 Chronic atrial fibrillation, unspecified: Secondary | ICD-10-CM | POA: Insufficient documentation

## 2019-12-02 DIAGNOSIS — E039 Hypothyroidism, unspecified: Secondary | ICD-10-CM | POA: Diagnosis not present

## 2019-12-02 DIAGNOSIS — Z993 Dependence on wheelchair: Secondary | ICD-10-CM | POA: Insufficient documentation

## 2019-12-02 DIAGNOSIS — Z79890 Hormone replacement therapy (postmenopausal): Secondary | ICD-10-CM | POA: Insufficient documentation

## 2019-12-02 DIAGNOSIS — E876 Hypokalemia: Secondary | ICD-10-CM | POA: Diagnosis not present

## 2019-12-02 DIAGNOSIS — Z888 Allergy status to other drugs, medicaments and biological substances status: Secondary | ICD-10-CM | POA: Insufficient documentation

## 2019-12-02 DIAGNOSIS — Z881 Allergy status to other antibiotic agents status: Secondary | ICD-10-CM | POA: Diagnosis not present

## 2019-12-02 DIAGNOSIS — D649 Anemia, unspecified: Secondary | ICD-10-CM | POA: Insufficient documentation

## 2019-12-02 DIAGNOSIS — I2510 Atherosclerotic heart disease of native coronary artery without angina pectoris: Secondary | ICD-10-CM | POA: Insufficient documentation

## 2019-12-02 DIAGNOSIS — Z952 Presence of prosthetic heart valve: Secondary | ICD-10-CM | POA: Insufficient documentation

## 2019-12-02 DIAGNOSIS — E1122 Type 2 diabetes mellitus with diabetic chronic kidney disease: Secondary | ICD-10-CM | POA: Diagnosis not present

## 2019-12-02 DIAGNOSIS — Z8249 Family history of ischemic heart disease and other diseases of the circulatory system: Secondary | ICD-10-CM | POA: Diagnosis not present

## 2019-12-02 DIAGNOSIS — Z951 Presence of aortocoronary bypass graft: Secondary | ICD-10-CM | POA: Insufficient documentation

## 2019-12-02 DIAGNOSIS — Z79899 Other long term (current) drug therapy: Secondary | ICD-10-CM | POA: Diagnosis not present

## 2019-12-02 DIAGNOSIS — Z87891 Personal history of nicotine dependence: Secondary | ICD-10-CM | POA: Insufficient documentation

## 2019-12-02 DIAGNOSIS — I5032 Chronic diastolic (congestive) heart failure: Secondary | ICD-10-CM

## 2019-12-02 DIAGNOSIS — Z7901 Long term (current) use of anticoagulants: Secondary | ICD-10-CM | POA: Insufficient documentation

## 2019-12-02 DIAGNOSIS — I5042 Chronic combined systolic (congestive) and diastolic (congestive) heart failure: Secondary | ICD-10-CM | POA: Diagnosis not present

## 2019-12-02 DIAGNOSIS — J449 Chronic obstructive pulmonary disease, unspecified: Secondary | ICD-10-CM | POA: Insufficient documentation

## 2019-12-02 DIAGNOSIS — G4733 Obstructive sleep apnea (adult) (pediatric): Secondary | ICD-10-CM | POA: Diagnosis not present

## 2019-12-02 LAB — COMPREHENSIVE METABOLIC PANEL
ALT: 20 U/L (ref 0–44)
AST: 29 U/L (ref 15–41)
Albumin: 3.8 g/dL (ref 3.5–5.0)
Alkaline Phosphatase: 96 U/L (ref 38–126)
Anion gap: 13 (ref 5–15)
BUN: 127 mg/dL — ABNORMAL HIGH (ref 8–23)
CO2: 23 mmol/L (ref 22–32)
Calcium: 9.4 mg/dL (ref 8.9–10.3)
Chloride: 92 mmol/L — ABNORMAL LOW (ref 98–111)
Creatinine, Ser: 3.19 mg/dL — ABNORMAL HIGH (ref 0.61–1.24)
GFR calc Af Amer: 20 mL/min — ABNORMAL LOW (ref >60)
GFR calc non Af Amer: 17 mL/min — ABNORMAL LOW (ref >60)
Glucose, Bld: 130 mg/dL — ABNORMAL HIGH (ref 70–99)
Potassium: 4.8 mmol/L (ref 3.5–5.1)
Sodium: 128 mmol/L — ABNORMAL LOW (ref 135–145)
Total Bilirubin: 0.9 mg/dL (ref 0.3–1.2)
Total Protein: 7.9 g/dL (ref 6.5–8.1)

## 2019-12-02 LAB — CBC
HCT: 31.5 % — ABNORMAL LOW (ref 39.0–52.0)
Hemoglobin: 10.4 g/dL — ABNORMAL LOW (ref 13.0–17.0)
MCH: 37.8 pg — ABNORMAL HIGH (ref 26.0–34.0)
MCHC: 33 g/dL (ref 30.0–36.0)
MCV: 114.5 fL — ABNORMAL HIGH (ref 80.0–100.0)
Platelets: 133 10*3/uL — ABNORMAL LOW (ref 150–400)
RBC: 2.75 MIL/uL — ABNORMAL LOW (ref 4.22–5.81)
RDW: 16.2 % — ABNORMAL HIGH (ref 11.5–15.5)
WBC: 5.1 10*3/uL (ref 4.0–10.5)
nRBC: 0 % (ref 0.0–0.2)

## 2019-12-02 LAB — T4, FREE: Free T4: 1 ng/dL (ref 0.61–1.12)

## 2019-12-02 LAB — BRAIN NATRIURETIC PEPTIDE: B Natriuretic Peptide: 169.9 pg/mL — ABNORMAL HIGH (ref 0.0–100.0)

## 2019-12-02 LAB — TSH: TSH: 17.01 u[IU]/mL — ABNORMAL HIGH (ref 0.350–4.500)

## 2019-12-02 MED ORDER — TORSEMIDE 20 MG PO TABS: ORAL_TABLET | ORAL | 6 refills | Status: DC

## 2019-12-02 MED ORDER — POTASSIUM CHLORIDE CRYS ER 20 MEQ PO TBCR: 40.0000 meq | EXTENDED_RELEASE_TABLET | Freq: Two times a day (BID) | ORAL | 1 refills | Status: DC

## 2019-12-02 NOTE — Progress Notes (Signed)
ReDS Vest / Clip - 12/02/19 0900      ReDS Vest / Clip   Station Marker D    Ruler Value 36    ReDS Value Range Low volume    ReDS Actual Value 19

## 2019-12-02 NOTE — Telephone Encounter (Signed)
-----   Message from Consuelo Pandy, [place] sent at 12/02/2019  2:59 PM EDT ----- BUN/SCR c/w dehydration.  Hold the next 2 doses of torsemide and also hold KCl during diuretic hold, then reduce torsemide down to 80 mg bid and reduce KCl to 40 mEq bid. STop Arlyce Harman. Repeat BMP and BNP on Friday.  Thyroid lab improving. Hgb and platelets ok.

## 2019-12-02 NOTE — Telephone Encounter (Signed)
Pt aware and voiced understanding  [facility] to draw labs

## 2019-12-02 NOTE — Progress Notes (Addendum)
Advanced Heart Failure Clinic Note   PCP: [name], [name], MD PCP-Cardiologist: [name], MD  HF: [name]  HPI: [name] is a 84 y.o. male with h/o mechanical AVR 1994, OSA on CPAP, Chronic afib, chronic combined CHF, CKD III, chronic coumadin therapy, CAD s/p CAGB 1994, and COPD.   Admitted 7/18-11/05/17 from [facility] office with A/C diastolic HF. Diuresed 83 lbs with lasix drip, metolazone, and diamox. Transitioned to torsemide 80 mg BID. Determined not to be a candidate for MitraClip. Dr [name] also consulted and recommended aggressive medical therapy versus  percutaneous MVR trial as he felt very high risk for conventional surgery. Course complicated by drop in hemoglobin and symptomatic hypotension, requiring 1 unit pRBCs. GI consulted and did not recommend scoping. BB and spiro were stopped due to low BPs. Had some AKI assoicated with low BP, but resolved by discharge. Required lovenox bridge at DC with subtherapeutic INR. Discharged with [facility]. DC weight: 216 lbs.   Admitted 9/11 - 12/14/17 for dental extractions in setting of work up for MVR. Hospital course complicated by run of VT in the setting of hypokalemia. Started on amiodarone with h/o of arrhythmia. Bridged with lovenox.   Admitted 9/22 - 12/28/17 with symptomatic anemia from bleeding gums. Bleeding stopped with holding heparin and coumadin. Received multiple units of blood.   He was admitted again in 10/19 with left thigh hematoma and went from there to inpatient rehab.   He has been evaluated in [place] for a transcatheter mitral valve.  RHC/LHC was done in 1/20 as part of his pre-op evaluation.  Filling pressures and cardiac output were well-compensated, he had patent SVG-PDA.  Echo in 1/20 showed EF 40-45%, moderate RV dilation with normal systolic function, severe MR with likely rheumatic mitral valve, normal St Jude mechanical aortic valve. Unfortunately, it was determined that he would not qualify for any of  the percutaneous mitral valve studies.    His diuretic doses have been up and down recently based on volume retention and creatinine.   Most recent echo 4/21 showed LVEF 50-55% with moderate LV dilation, severe MR, mildly decreased RV systolic function, dilated IVC, stable mechanical aortic valve.   At last visit, his TSH was elevated at 40. Dr. [name] advised that he increase Synthroid to 75 mg daily.   He returns to clinic today for f/u. Here w/ his wife. He notes progressive wt loss and weakness. Now more dependent on wheelchair to get around. Difficulty w/ leg weakness and poor balance but no falls. Working w/ home PT. Also notes dizziness w/ standing. Wife notes that his appetite has not been great. He saw his nephrologist last week and SCr was higher, in the 3 range. He was advised to stop scheduled metolazone (was taking 2x/week). He is feeling somewhat better now but continues w/ symptoms. Denies significant dyspnea, has mild exertional dyspnea w/ physical activity, but c/w usual baseline. No resting dyspnea. Denies orthopnea/PND. No LEE. BP 102/60 today. Occasionally in the upper 75T systolic at home. Denies abnormal bleeding. ReDs Clip low today at 19.   ReDs Clip low today at 19%    Labs (10/19): hgb 9.2 Labs (11/19): K 3.2, creatinine 1.31 Labs (1/20): K 4.5, creatinine 1.38 Labs (2/20): LDL 64 Labs (6/20): K 4.1, creatinine 1.8, hgb 12.9 Labs (1/21): LDL 64 Labs (2/21): K 4, creatinine 2.9 => 2.47 Labs (3/21): K 4.9, creatinine 3 => 2.92, LFTs normal, elevated TSH with normal free T3 and T4 Labs (5/21): hgb  10, plts 53 Labs (6/21): K 3.7, creatinine 2.16 Labs (7/21): K 4.2, creatinine 2.82, TSH 40   ROS:  All systems reviewed and negative except as per HPI.    PMH: 1. Carotid stenosis: S/p left CEA in 1994.  2. Atrial fibrillation: Chronic.  He is on warfarin with mechanical aortic valve.  3. Type II diabetes.  4. COPD: PFTs (11/19) with moderate obstructive airways disease  and severely decreased DLCO.  5. OSA: Bipap recommended 6. 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  7. ITP 8. VT: On amiodarone.  9. CKD: Stage 3.  10. 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  11. Chronic systolic CHF: TEE (6/43) with EF 55-60%, D-shaped septum, mildly dilated RV with mildly decreased systolic function, mechanical aortic valve, rheumatic-appearing mitral valve with severe MR, minimal stenosis.  - RHC (1/20): mean RA 6, PA 54/20, mean PCWP 15, CI 3.42, PVR 2.1 WU, PAPI 5.2 - Echo (1/20): EF 40-45%, moderate RV dilation with normal systolic function, severe biatrial enlargement, severe rheumatic MR, St Jude mechanical aortic valve functioning normally.  - Echo (3/21): EF 50-55% with moderate LV dilation, severe MR, mildly decreased RV systolic function, dilated IVC, stable mechanical aortic valve.  12. ABIs (12/18): Normal.  13. CAD: S/p CABG 1994.  - LHC (1/20): 50% D1, 60% ostial LCx, totally occluded RCA with patent SVG-PDA.     Current Outpatient Medications  Medication Sig Dispense Refill  . acetaminophen (TYLENOL) 500 MG tablet Take 1,000 mg by mouth every 6 (six) hours as needed for moderate pain or headache.    . albuterol (PROAIR HFA) 108 (90 Base) MCG/ACT inhaler Inhale 2 puffs into the lungs every 4 (four) hours as needed for wheezing or shortness of breath. 18 g 5  . allopurinol (ZYLOPRIM) 100 MG tablet TAKE 1 TABLET BY MOUTH EVERY DAY 90 tablet 3  . amiodarone (PACERONE) 200 MG tablet Take 1 tablet (200 mg total) by mouth daily. 90 tablet 3  . carvedilol (COREG) 3.125 MG tablet Take 1 tablet (3.125 mg total) by mouth 2 (two) times daily. 180 tablet 3  . ezetimibe-simvastatin (VYTORIN) 10-20 MG tablet TAKE 1 TABLET BY MOUTH EVERYDAY AT BEDTIME 90 tablet 3  . finasteride (PROSCAR) 5 MG tablet Take 5 mg by mouth daily.    Marland Kitchen levothyroxine (SYNTHROID) 50 MCG  tablet Take 1.5 tablets (75 mcg total) by mouth daily before breakfast. 90 tablet 3  . metolazone (ZAROXOLYN) 2.5 MG tablet Take 1 tablet (2.5 mg total) by mouth once a week. 38 tablet 3  . polyethylene glycol powder (GLYCOLAX/MIRALAX) powder Take 17 g by mouth daily as needed for moderate constipation.    . potassium chloride SA (KLOR-CON M20) 20 MEQ tablet Take 60 meq (3 tabs) in AM, 40 meq (2 tabs) at noon and 40 meq (2 tabs) in PM 630 tablet 1  . spironolactone (ALDACTONE) 25 MG tablet Take 25 mg by mouth daily.    . tamsulosin (FLOMAX) 0.4 MG CAPS capsule Take 0.4 mg by mouth daily after supper.     . torsemide (DEMADEX) 100 MG tablet Take 1 tablet (100 mg total) by mouth 2 (two) times daily. 180 tablet 3  . triamcinolone ointment (KENALOG) 0.1 % Apply 1 application topically 2 (two) times daily. (Patient taking differently: Apply 1 application topically daily as needed. ) 80 g 1  . warfarin (COUMADIN) 5 MG tablet TAKE  AS DIRECTED BY COUMADIN  CLINIC 100 tablet 1   No current facility-administered medications for this encounter.   Allergies  Allergen Reactions  . Rifampin Rash and Other (See Comments)    May have been caused by Vancomycin or Rifampin (??)  . Vancomycin Rash and Other (See Comments)    May have been caused by Vancomycin or Rifampin (??)   Social History   Socioeconomic History  . Marital status: Married    Spouse name: Not on file  . Number of children: 3  . Years of education: Not on file  . Highest education level: Not on file  Occupational History  . Not on file  Tobacco Use  . Smoking status: Former Smoker    Packs/day: 1.00    Years: 30.00    Pack years: 30.00    Types: Cigarettes    Quit date: 04/03/1992    Years since quitting: 27.6  . Smokeless tobacco: Never Used  Vaping Use  . Vaping Use: Never used  Substance and Sexual Activity  . Alcohol use: Yes    Comment: 4-6 ounces of wine daily  . Drug use: No    Comment: Half a cup a day.  Marland Kitchen Sexual  activity: Not on file  Other Topics Concern  . Not on file  Social History Narrative   Patient is married and lives with his wife.   Youngest daughter lives in [place] also.   Patient has 2 other children.   Social Determinants of Health   Financial Resource Strain:   . Difficulty of Paying Living Expenses: Not on file  Food Insecurity:   . Worried About Charity fundraiser in the Last Year: Not on file  . Ran Out of Food in the Last Year: Not on file  Transportation Needs:   . Lack of Transportation (Medical): Not on file  . Lack of Transportation (Non-Medical): Not on file  Physical Activity:   . Days of Exercise per Week: Not on file  . Minutes of Exercise per Session: Not on file  Stress:   . Feeling of Stress : Not on file  Social Connections:   . Frequency of Communication with Friends and Family: Not on file  . Frequency of Social Gatherings with Friends and Family: Not on file  . Attends Religious Services: Not on file  . Active Member of Clubs or Organizations: Not on file  . Attends Archivist Meetings: Not on file  . Marital Status: Not on file  Intimate Partner Violence:   . Fear of Current or Ex-Partner: Not on file  . Emotionally Abused: Not on file  . Physically Abused: Not on file  . Sexually Abused: Not on file    Family History  Problem Relation Age of Onset  . Cancer Father        lung   . Hypertension Mother    Vitals:   12/02/19 0858  BP: 102/60  Pulse: (!) 57  SpO2: 97%  Weight: 99.3 kg (219 lb)  Height: 6\' 2"  (1.88 m)     Wt Readings from Last 3 Encounters:  12/02/19 99.3 kg (219 lb)  11/12/19 102.6 kg (226 lb 1.6 oz)  10/14/19 103 kg (227 lb)   PHYSICAL EXAM: ReDs Clip 19%  General:  Well appearing, elderly male in wheel chair. No respiratory difficulty HEENT: normal Neck: supple. no JVD. Carotids 2+ bilat; no bruits. No lymphadenopathy or thyromegaly appreciated. Cor: PMI nondisplaced. Irregularly irregular rhythm,  regular rate, crisp mechanical valve sounds  Lungs: clear  Abdomen: soft, nontender, nondistended. No hepatosplenomegaly. No bruits or masses. Good bowel sounds. Extremities: no cyanosis, clubbing, rash, edema Neuro: alert & oriented x 3, cranial nerves grossly intact. moves all 4 extremities w/o difficulty. Affect pleasant.   ASSESSMENT & PLAN: 1. Chronic systolic>>disatolic CHF with prominent RV failure:  Echo in 3/21 with EF 50-55%, mildly decreased RV systolic function, severe rheumatic mitral regurgitation, normal mechanical aortic valve.  Severe MR likely plays a significant role in CHF and RV failure. He has had progressive wt loss in the setting of poor PO intake, also complains of weakness. Recent labs at nephrology office showed AKI w/ rise in SCr up to 3 range. Twice weekly metolazone discontinued. He continues to loose wt, but feels better after diuretic dose decrease. He appears dry on exam. ReDs Clip low at 19%. Chronically NYHA III.  - Continue to hold metolazone - Repeat BMP today to reassess SCr/BUN and will adjust torsemide dose accordingly (currently on 100 mg bid, may need dose reduction).  - He is off spironolactone with elevated creatinine.  - Continue Coreg 3.125 mg bid.  2. Mechanical aortic valve: Appeared to function well on last echo. Crisp mechanical valve sounds on exam  - Continue warfarin with INR goal 2.5-3.5 (also with atrial fibrillation).  - He is off aspirin with spontaneous thigh hematoma.   - denies abnormal bleeding. Check CBC today  3. Atrial fibrillation: Chronic, rate is controlled.  - continue  blocker and amiodarone for rate control - check TSH and HFTs - needs annual eye exams - continue coumadin for a/c   4. CAD: s/p CABG.  No chest pain.  Cath in 1/20 with patent SVG-PDA, no interventional target.  - No ASA given stable CAD with warfarin use.   - Continue Vytorin, good lipids in 1/21.  5. COPD: He is no longer using oxygen during the day.   Moderate COPD on 11/19 PFTs.   6. OSA: reports compliance w/ CPAP  7.CKD Stage 3b: Check BMET today.  8. Thrombocytopenia: Chronic, has been mild/stable. No spontaneous bleeding. CBC today.  9. Severe MR: TEE 7/19 with severe MR with possible rheumatic MV (do not think MS is significant, mildly elevated mean gradient from high flow with severe MR).  Structural Heart team evaluated, not a Mitraclip candidate. Seen by Dr. [name], would be high risk for open surgery.  He was evaluated at [facility] in [place] for percutaneous mitral valve trials.  Unfortunately, he does not qualify for any of the available trials.  We will have to continue medical management.  10. VT: He is on amiodarone to suppress VT.  - Check LFTs.  Hypothyroidism followed by PCP.  Will need regular eye exam on amiodarone.  11. Chronic Anemia:  Baseline hgb 9-10  - Check CBC today.   F/u in 4 weeks  Lyda Jester, PA-C  12/02/2019

## 2019-12-02 NOTE — Patient Instructions (Signed)
It was great to see you today! No medication changes are needed at this time.  Labs today We will only contact you if something comes back abnormal or we need to make some changes. Otherwise no news is good news!  Your physician recommends that you schedule a follow-up appointment in: 2 months  If you have any questions or concerns before your next appointment please send [place] a message through [place] or call our office at [phone].    TO LEAVE A MESSAGE FOR THE NURSE SELECT OPTION 2, PLEASE LEAVE A MESSAGE INCLUDING: . YOUR NAME . DATE OF BIRTH . CALL BACK NUMBER . REASON FOR CALL**this is important as we prioritize the call backs  YOU WILL RECEIVE A CALL BACK THE SAME DAY AS LONG AS YOU CALL BEFORE 4:00 PM

## 2019-12-03 DIAGNOSIS — Z960 Presence of urogenital implants: Secondary | ICD-10-CM

## 2019-12-03 HISTORY — DX: Presence of urogenital implants: Z96.0

## 2019-12-03 LAB — T3, FREE: T3, Free: 1.8 pg/mL — ABNORMAL LOW (ref 2.0–4.4)

## 2019-12-04 DIAGNOSIS — R351 Nocturia: Secondary | ICD-10-CM | POA: Diagnosis not present

## 2019-12-04 DIAGNOSIS — N3021 Other chronic cystitis with hematuria: Secondary | ICD-10-CM | POA: Diagnosis not present

## 2019-12-04 DIAGNOSIS — R3914 Feeling of incomplete bladder emptying: Secondary | ICD-10-CM | POA: Diagnosis not present

## 2019-12-05 DIAGNOSIS — I509 Heart failure, unspecified: Secondary | ICD-10-CM | POA: Diagnosis not present

## 2019-12-05 LAB — POCT INR: INR: 3.4 — AB (ref 2.0–3.0)

## 2019-12-07 DIAGNOSIS — E039 Hypothyroidism, unspecified: Secondary | ICD-10-CM | POA: Diagnosis not present

## 2019-12-07 DIAGNOSIS — I5032 Chronic diastolic (congestive) heart failure: Secondary | ICD-10-CM | POA: Diagnosis not present

## 2019-12-07 DIAGNOSIS — I4892 Unspecified atrial flutter: Secondary | ICD-10-CM | POA: Diagnosis not present

## 2019-12-07 DIAGNOSIS — R32 Unspecified urinary incontinence: Secondary | ICD-10-CM | POA: Diagnosis not present

## 2019-12-07 DIAGNOSIS — Z951 Presence of aortocoronary bypass graft: Secondary | ICD-10-CM | POA: Diagnosis not present

## 2019-12-07 DIAGNOSIS — J449 Chronic obstructive pulmonary disease, unspecified: Secondary | ICD-10-CM | POA: Diagnosis not present

## 2019-12-07 DIAGNOSIS — E1122 Type 2 diabetes mellitus with diabetic chronic kidney disease: Secondary | ICD-10-CM | POA: Diagnosis not present

## 2019-12-07 DIAGNOSIS — Z79899 Other long term (current) drug therapy: Secondary | ICD-10-CM | POA: Diagnosis not present

## 2019-12-07 DIAGNOSIS — I7090 Unspecified atherosclerosis: Secondary | ICD-10-CM | POA: Diagnosis not present

## 2019-12-07 DIAGNOSIS — I051 Rheumatic mitral insufficiency: Secondary | ICD-10-CM | POA: Diagnosis not present

## 2019-12-07 DIAGNOSIS — D61818 Other pancytopenia: Secondary | ICD-10-CM | POA: Diagnosis not present

## 2019-12-07 DIAGNOSIS — N189 Chronic kidney disease, unspecified: Secondary | ICD-10-CM | POA: Diagnosis not present

## 2019-12-07 DIAGNOSIS — Z952 Presence of prosthetic heart valve: Secondary | ICD-10-CM | POA: Diagnosis not present

## 2019-12-07 DIAGNOSIS — I130 Hypertensive heart and chronic kidney disease with heart failure and stage 1 through stage 4 chronic kidney disease, or unspecified chronic kidney disease: Secondary | ICD-10-CM | POA: Diagnosis not present

## 2019-12-07 DIAGNOSIS — I50812 Chronic right heart failure: Secondary | ICD-10-CM | POA: Diagnosis not present

## 2019-12-07 DIAGNOSIS — Z7901 Long term (current) use of anticoagulants: Secondary | ICD-10-CM | POA: Diagnosis not present

## 2019-12-07 DIAGNOSIS — R2689 Other abnormalities of gait and mobility: Secondary | ICD-10-CM | POA: Diagnosis not present

## 2019-12-07 DIAGNOSIS — I6529 Occlusion and stenosis of unspecified carotid artery: Secondary | ICD-10-CM | POA: Diagnosis not present

## 2019-12-07 DIAGNOSIS — I480 Paroxysmal atrial fibrillation: Secondary | ICD-10-CM | POA: Diagnosis not present

## 2019-12-07 DIAGNOSIS — Z9181 History of falling: Secondary | ICD-10-CM | POA: Diagnosis not present

## 2019-12-09 ENCOUNTER — Ambulatory Visit (INDEPENDENT_AMBULATORY_CARE_PROVIDER_SITE_OTHER): Payer: Medicare Other | Admitting: Pharmacist

## 2019-12-09 ENCOUNTER — Telehealth: Payer: Self-pay | Admitting: Pharmacist

## 2019-12-09 DIAGNOSIS — Z952 Presence of prosthetic heart valve: Secondary | ICD-10-CM

## 2019-12-09 DIAGNOSIS — Z7901 Long term (current) use of anticoagulants: Secondary | ICD-10-CM

## 2019-12-09 DIAGNOSIS — I4891 Unspecified atrial fibrillation: Secondary | ICD-10-CM | POA: Diagnosis not present

## 2019-12-09 DIAGNOSIS — I482 Chronic atrial fibrillation, unspecified: Secondary | ICD-10-CM

## 2019-12-09 NOTE — Telephone Encounter (Signed)
[place] Visit Follow Up Request   Date of Request ([place]):  December 09, 2019  Requesting Provider:  Dr. Daneen Schick    Agency Requested:    [facility] Contact:  Glory Buff, NP [address] [place], [place] [address] Phone #:  [phone] Fax #:  [phone]  Patient Demographic Information: Name:  Vaughn Beaumier Age:  84 y.o.   DOB:  [birth year]  MRN:  [id number]   Home visit progress note(s), lab results, telemetry strips, etc were reviewed.  Provider Recommendations: INR check on 12/17/19  Follow up home services requested:  Labs:  INR  All labs ordered for this home visit have been released and the request was sent to [name] at [facility].

## 2019-12-10 NOTE — Addendum Note (Signed)
Addended by: Marcelle Overlie D on: 12/10/2019 07:05 AM   Modules accepted: Orders

## 2019-12-11 DIAGNOSIS — I130 Hypertensive heart and chronic kidney disease with heart failure and stage 1 through stage 4 chronic kidney disease, or unspecified chronic kidney disease: Secondary | ICD-10-CM | POA: Diagnosis not present

## 2019-12-11 DIAGNOSIS — R2689 Other abnormalities of gait and mobility: Secondary | ICD-10-CM | POA: Diagnosis not present

## 2019-12-11 DIAGNOSIS — E1122 Type 2 diabetes mellitus with diabetic chronic kidney disease: Secondary | ICD-10-CM | POA: Diagnosis not present

## 2019-12-11 DIAGNOSIS — I50812 Chronic right heart failure: Secondary | ICD-10-CM | POA: Diagnosis not present

## 2019-12-11 DIAGNOSIS — I5032 Chronic diastolic (congestive) heart failure: Secondary | ICD-10-CM | POA: Diagnosis not present

## 2019-12-11 DIAGNOSIS — N189 Chronic kidney disease, unspecified: Secondary | ICD-10-CM | POA: Diagnosis not present

## 2019-12-17 ENCOUNTER — Ambulatory Visit (INDEPENDENT_AMBULATORY_CARE_PROVIDER_SITE_OTHER): Payer: Medicare Other | Admitting: Internal Medicine

## 2019-12-17 DIAGNOSIS — Z5181 Encounter for therapeutic drug level monitoring: Secondary | ICD-10-CM

## 2019-12-17 DIAGNOSIS — I5032 Chronic diastolic (congestive) heart failure: Secondary | ICD-10-CM | POA: Diagnosis not present

## 2019-12-17 DIAGNOSIS — I482 Chronic atrial fibrillation, unspecified: Secondary | ICD-10-CM

## 2019-12-17 LAB — PROTIME-INR
INR: 2.3 — AB (ref <=1.1)
INR: 5.1 — AB (ref 0.9–1.1)

## 2019-12-17 NOTE — Patient Instructions (Addendum)
Description   Spoke to pt instructed pt to take 1.5 tablets of Warfarin today and then continue to take 1 tablet daily except for 1/2 a tablet on Thursdays. Call Coumadin Clinic [phone] for any changes in medications or upcoming procedures.  [facility] to check INR in 2 weeks, order faxed.

## 2019-12-18 ENCOUNTER — Ambulatory Visit (INDEPENDENT_AMBULATORY_CARE_PROVIDER_SITE_OTHER): Payer: Medicare Other

## 2019-12-18 DIAGNOSIS — I50812 Chronic right heart failure: Secondary | ICD-10-CM | POA: Diagnosis not present

## 2019-12-18 DIAGNOSIS — I130 Hypertensive heart and chronic kidney disease with heart failure and stage 1 through stage 4 chronic kidney disease, or unspecified chronic kidney disease: Secondary | ICD-10-CM | POA: Diagnosis not present

## 2019-12-18 DIAGNOSIS — I4891 Unspecified atrial fibrillation: Secondary | ICD-10-CM

## 2019-12-18 DIAGNOSIS — Z952 Presence of prosthetic heart valve: Secondary | ICD-10-CM | POA: Diagnosis not present

## 2019-12-18 DIAGNOSIS — E1122 Type 2 diabetes mellitus with diabetic chronic kidney disease: Secondary | ICD-10-CM | POA: Diagnosis not present

## 2019-12-18 DIAGNOSIS — N189 Chronic kidney disease, unspecified: Secondary | ICD-10-CM | POA: Diagnosis not present

## 2019-12-18 DIAGNOSIS — I5032 Chronic diastolic (congestive) heart failure: Secondary | ICD-10-CM | POA: Diagnosis not present

## 2019-12-18 DIAGNOSIS — R2689 Other abnormalities of gait and mobility: Secondary | ICD-10-CM | POA: Diagnosis not present

## 2019-12-18 NOTE — Patient Instructions (Addendum)
Description   Spoke to pt advised to hold his Warfarin dosage x 3 days, then resume previous dosage regimen 1 tablet daily except for 1/2 a tablet on Thursdays. Call Coumadin Clinic [phone] for any changes in medications or upcoming procedures.  [facility] to check INR in 1 week, order faxed. [facility]- [phone].  Called [facility] spoke with Ebony Hail, advised recheck in 1 week, this order replaces order faxed yesterday stating to recheck in 2 weeks.  Ebony Hail changed recheck to 12/25/19 with my verbal order.

## 2019-12-19 ENCOUNTER — Telehealth (HOSPITAL_COMMUNITY): Payer: Self-pay

## 2019-12-19 DIAGNOSIS — D696 Thrombocytopenia, unspecified: Secondary | ICD-10-CM | POA: Diagnosis not present

## 2019-12-19 DIAGNOSIS — N179 Acute kidney failure, unspecified: Secondary | ICD-10-CM | POA: Diagnosis not present

## 2019-12-19 MED ORDER — METOLAZONE 2.5 MG PO TABS
2.5000 mg | ORAL_TABLET | ORAL | 0 refills | Status: DC
Start: 2019-12-19 — End: 2019-12-29

## 2019-12-19 NOTE — Telephone Encounter (Signed)
Patient advised and verbalized understanding. Med list updated to reflect changes.  ? ?

## 2019-12-19 NOTE — Telephone Encounter (Signed)
-----   Message from Larey Dresser, MD sent at 12/19/2019 12:05 PM EDT -----   ----- Message ----- From: Belva Crome, MD Sent: 12/18/2019   3:15 PM EDT To: Larey Dresser, MD   ----- Message ----- From: Merleen Milliner Sent: 12/18/2019   2:12 PM EDT To: Belva Crome, MD

## 2019-12-19 NOTE — Progress Notes (Signed)
Let's have him go back to taking metolazone 2.5 mg once weekly (take next dose a week after last dose).

## 2019-12-19 NOTE — Progress Notes (Signed)
Patient advised and verbalized understanding. However, patient reports that he has been experiencing black stools for about a week now. No blood seen in urine but does report feeling weak at times. [facility] is suppose to send a nurse to see him today.

## 2019-12-20 ENCOUNTER — Other Ambulatory Visit
Admission: RE | Admit: 2019-12-20 | Discharge: 2019-12-20 | Disposition: A | Discharge status: Home / Self Care | Payer: Medicare Other | Source: Ambulatory Visit | Attending: Nurse Practitioner | Admitting: Nurse Practitioner

## 2019-12-20 DIAGNOSIS — I5032 Chronic diastolic (congestive) heart failure: Secondary | ICD-10-CM | POA: Diagnosis not present

## 2019-12-20 DIAGNOSIS — I4891 Unspecified atrial fibrillation: Secondary | ICD-10-CM | POA: Diagnosis not present

## 2019-12-20 LAB — CBC WITH DIFFERENTIAL/PLATELET
Abs Immature Granulocytes: 0.05 10*3/uL (ref 0.00–0.07)
Basophils Absolute: 0 10*3/uL (ref 0.0–0.1)
Basophils Relative: 0 %
Eosinophils Absolute: 0.1 10*3/uL (ref 0.0–0.5)
Eosinophils Relative: 1 %
HCT: 21.7 % — ABNORMAL LOW (ref 39.0–52.0)
Hemoglobin: 7.2 g/dL — ABNORMAL LOW (ref 13.0–17.0)
Immature Granulocytes: 1 %
Lymphocytes Relative: 15 %
Lymphs Abs: 0.6 10*3/uL — ABNORMAL LOW (ref 0.7–4.0)
MCH: 39.1 pg — ABNORMAL HIGH (ref 26.0–34.0)
MCHC: 33.2 g/dL (ref 30.0–36.0)
MCV: 117.9 fL — ABNORMAL HIGH (ref 80.0–100.0)
Monocytes Absolute: 0.4 10*3/uL (ref 0.1–1.0)
Monocytes Relative: 10 %
Neutro Abs: 3 10*3/uL (ref 1.7–7.7)
Neutrophils Relative %: 73 %
Platelets: 89 10*3/uL — ABNORMAL LOW (ref 150–400)
RBC: 1.84 MIL/uL — ABNORMAL LOW (ref 4.22–5.81)
RDW: 18.5 % — ABNORMAL HIGH (ref 11.5–15.5)
Smear Review: DECREASED
WBC: 4.1 10*3/uL (ref 4.0–10.5)
nRBC: 0.5 % — ABNORMAL HIGH (ref 0.0–0.2)

## 2019-12-21 ENCOUNTER — Other Ambulatory Visit (HOSPITAL_COMMUNITY): Payer: Self-pay | Admitting: Cardiology

## 2019-12-21 NOTE — Progress Notes (Signed)
Needs CBC done asap if was not done on Friday.

## 2019-12-22 ENCOUNTER — Other Ambulatory Visit (HOSPITAL_COMMUNITY): Payer: Self-pay | Admitting: *Deleted

## 2019-12-22 DIAGNOSIS — I5032 Chronic diastolic (congestive) heart failure: Secondary | ICD-10-CM | POA: Diagnosis not present

## 2019-12-22 LAB — PROTIME-INR: INR: 2.6 — AB (ref 0.9–1.1)

## 2019-12-22 NOTE — Progress Notes (Signed)
Cbc was done Saturday

## 2019-12-23 ENCOUNTER — Ambulatory Visit (INDEPENDENT_AMBULATORY_CARE_PROVIDER_SITE_OTHER): Payer: Medicare Other | Admitting: *Deleted

## 2019-12-23 ENCOUNTER — Telehealth (HOSPITAL_COMMUNITY): Payer: Self-pay | Admitting: Cardiology

## 2019-12-23 DIAGNOSIS — Z5181 Encounter for therapeutic drug level monitoring: Secondary | ICD-10-CM

## 2019-12-23 DIAGNOSIS — I4891 Unspecified atrial fibrillation: Secondary | ICD-10-CM

## 2019-12-23 DIAGNOSIS — Z952 Presence of prosthetic heart valve: Secondary | ICD-10-CM

## 2019-12-23 NOTE — Patient Instructions (Signed)
Description   Spoke to pt advised to take 1.5 tablets today (held Sun and Mon per [facility]) then continue taking 1 tablet daily except for 1/2 tablet on Thursdays. Call Coumadin Clinic [phone] for any changes in medications or upcoming procedures. [facility] to check INR in 1 week, order faxed. [facility]- [phone].

## 2019-12-23 NOTE — Telephone Encounter (Signed)
Gloria,Wife of the patient called. She states she got a phone call from our office about re-starting the patient's Coumadin. Please call the Wife to discuss re-starting medicine

## 2019-12-23 NOTE — Telephone Encounter (Signed)
Returned a call to the pt and the wife states they are looking for INR results on the pt and the pt was told to continue holding his Warfarin per [facility] on Sunday and Monday; therefore, the pt has not taken any Warfarin since 12/18/2019. We had an INR of 5.1 on 9/16 advised to hold Thursday, Friday, Saturday then resume on Sunday; however, wife and pt state [facility] told them it was still too high at 5 something again from another lab redarw so they were instructed to hold Sunday and Monday as well, then [facility] redrew labs on Monday and wife is adamant that the labs should be back so she cold know what to do with his diet and he could know what to do with his Warfarin. Advised that [facility] usually faxes them to [place] in a timely manner. Also, advised that the INR will continue to drop as he has been holding the last 2 days so that will not be reflected in the INR if it is sent over. Went over the risk of bleeding with higher/thinner levels and risk of clot, stroke with lower/thicker INR levels. Advised once INR returns we will take call back so we can address the levels and he verbalized understanding.

## 2019-12-25 ENCOUNTER — Ambulatory Visit (INDEPENDENT_AMBULATORY_CARE_PROVIDER_SITE_OTHER): Payer: Medicare Other | Admitting: Pharmacist

## 2019-12-25 ENCOUNTER — Telehealth: Payer: Self-pay | Admitting: Pharmacist

## 2019-12-25 DIAGNOSIS — Z952 Presence of prosthetic heart valve: Secondary | ICD-10-CM

## 2019-12-25 DIAGNOSIS — I482 Chronic atrial fibrillation, unspecified: Secondary | ICD-10-CM | POA: Diagnosis not present

## 2019-12-25 DIAGNOSIS — I4811 Longstanding persistent atrial fibrillation: Secondary | ICD-10-CM

## 2019-12-25 DIAGNOSIS — Z7901 Long term (current) use of anticoagulants: Secondary | ICD-10-CM | POA: Diagnosis not present

## 2019-12-25 LAB — POCT INR: INR: 2.2 (ref 2.0–3.0)

## 2019-12-25 NOTE — Telephone Encounter (Signed)
[facility] called asking questions regarding Mr Loden's care.  Concern regarding patient's recent high INR and history of GI bleeds.  Asencion Partridge sends INR results to [facility] so sometimes results don't get faxed to coumadin clinic until after closing.  Decided on new process.  Asencion Partridge will call coumadin clinic and verbally tell us the patient's condition and findings and also verbally tell us the INR result when she gets it. She will also leave a message if it is after hours or over the weekend.

## 2019-12-25 NOTE — Patient Instructions (Signed)
Description   Spoke with patient, instructed to take 1 tablet today (usually takes 1/2) then continue taking 1 tablet daily except for 1/2 tablet on Thursdays. Call Coumadin Clinic [phone] for any changes in medications or upcoming procedures. [facility] to check INR in 1 week, order faxed. [facility]- [phone].

## 2019-12-25 NOTE — Telephone Encounter (Signed)
No call from [place] at [facility] to review patient findings and give lab results; per last telephone note the plan was discussed this morning to do this. We have received INR lab results from [facility] via fax that have a 115pm receiving time. Since no call from [facility] or [facility] to prevent delay in care, Gerald Stabs, our Pharmacist, has reached out to the patient and wife at 440pm, regarding this INR level and took care of the findings and results. Also, he faxed over next lab draw order.

## 2019-12-26 DIAGNOSIS — I50812 Chronic right heart failure: Secondary | ICD-10-CM | POA: Diagnosis not present

## 2019-12-26 DIAGNOSIS — R2689 Other abnormalities of gait and mobility: Secondary | ICD-10-CM | POA: Diagnosis not present

## 2019-12-26 DIAGNOSIS — I5032 Chronic diastolic (congestive) heart failure: Secondary | ICD-10-CM | POA: Diagnosis not present

## 2019-12-26 DIAGNOSIS — E1122 Type 2 diabetes mellitus with diabetic chronic kidney disease: Secondary | ICD-10-CM | POA: Diagnosis not present

## 2019-12-26 DIAGNOSIS — I130 Hypertensive heart and chronic kidney disease with heart failure and stage 1 through stage 4 chronic kidney disease, or unspecified chronic kidney disease: Secondary | ICD-10-CM | POA: Diagnosis not present

## 2019-12-26 DIAGNOSIS — N189 Chronic kidney disease, unspecified: Secondary | ICD-10-CM | POA: Diagnosis not present

## 2019-12-29 ENCOUNTER — Telehealth (HOSPITAL_COMMUNITY): Payer: Self-pay | Admitting: *Deleted

## 2019-12-29 DIAGNOSIS — I5032 Chronic diastolic (congestive) heart failure: Secondary | ICD-10-CM

## 2019-12-29 MED ORDER — METOLAZONE 2.5 MG PO TABS: ORAL_TABLET | ORAL | 0 refills | Status: DC

## 2019-12-29 NOTE — Telephone Encounter (Signed)
Pt left VM stating his weight is up 12lbs in 1 month. Pt said he feels like he needs more torsemide but not sure if his kidney function will allow it. Pt said he is taking all medication as prescribed and has not had a change in his diet.  Routed to [place] for advice

## 2019-12-29 NOTE — Telephone Encounter (Signed)
Pt aware and agreeable with plan. Lab appt scheduled.  

## 2019-12-29 NOTE — Telephone Encounter (Signed)
He can increase metolazone to twice a week but will need repeat BMET in 1 week.

## 2019-12-31 DIAGNOSIS — I50812 Chronic right heart failure: Secondary | ICD-10-CM | POA: Diagnosis not present

## 2019-12-31 DIAGNOSIS — N189 Chronic kidney disease, unspecified: Secondary | ICD-10-CM | POA: Diagnosis not present

## 2019-12-31 DIAGNOSIS — R2689 Other abnormalities of gait and mobility: Secondary | ICD-10-CM | POA: Diagnosis not present

## 2019-12-31 DIAGNOSIS — I5032 Chronic diastolic (congestive) heart failure: Secondary | ICD-10-CM | POA: Diagnosis not present

## 2019-12-31 DIAGNOSIS — I130 Hypertensive heart and chronic kidney disease with heart failure and stage 1 through stage 4 chronic kidney disease, or unspecified chronic kidney disease: Secondary | ICD-10-CM | POA: Diagnosis not present

## 2019-12-31 DIAGNOSIS — E1122 Type 2 diabetes mellitus with diabetic chronic kidney disease: Secondary | ICD-10-CM | POA: Diagnosis not present

## 2020-01-01 ENCOUNTER — Ambulatory Visit (INDEPENDENT_AMBULATORY_CARE_PROVIDER_SITE_OTHER): Payer: Medicare Other | Admitting: Pharmacist

## 2020-01-01 DIAGNOSIS — I4811 Longstanding persistent atrial fibrillation: Secondary | ICD-10-CM | POA: Diagnosis not present

## 2020-01-01 DIAGNOSIS — R8271 Bacteriuria: Secondary | ICD-10-CM | POA: Diagnosis not present

## 2020-01-01 DIAGNOSIS — R338 Other retention of urine: Secondary | ICD-10-CM | POA: Diagnosis not present

## 2020-01-01 DIAGNOSIS — Z952 Presence of prosthetic heart valve: Secondary | ICD-10-CM

## 2020-01-01 DIAGNOSIS — I5042 Chronic combined systolic (congestive) and diastolic (congestive) heart failure: Secondary | ICD-10-CM | POA: Diagnosis not present

## 2020-01-01 DIAGNOSIS — N1330 Unspecified hydronephrosis: Secondary | ICD-10-CM | POA: Diagnosis not present

## 2020-01-01 DIAGNOSIS — I482 Chronic atrial fibrillation, unspecified: Secondary | ICD-10-CM | POA: Diagnosis not present

## 2020-01-01 LAB — POCT INR: INR: 6 — AB (ref 2.0–3.0)

## 2020-01-01 NOTE — Patient Instructions (Signed)
Description   Spoke with patient, instructed to hold dose today, tomorrow, and Saturday, then continue normal schedule.  Recheck INR in 1 week.  Carmen from [facility] called in result.  Faxed order to [facility]- [phone].

## 2020-01-02 ENCOUNTER — Telehealth: Payer: Self-pay | Admitting: *Deleted

## 2020-01-02 DIAGNOSIS — I482 Chronic atrial fibrillation, unspecified: Secondary | ICD-10-CM

## 2020-01-02 NOTE — Telephone Encounter (Signed)
Pt called and wanted to know if since he has a [facility] Failure Clinic lab appt next week if he could get his INR done there as well since they are due on the same day. Advised I could call and place an order for them to do the INR that same day. He stated he would prefer the one venipuncture stick if possible and advised I understood and would assist in this.

## 2020-01-05 ENCOUNTER — Ambulatory Visit (INDEPENDENT_AMBULATORY_CARE_PROVIDER_SITE_OTHER): Payer: Medicare Other | Admitting: *Deleted

## 2020-01-05 ENCOUNTER — Telehealth: Payer: Self-pay | Admitting: *Deleted

## 2020-01-05 DIAGNOSIS — Z952 Presence of prosthetic heart valve: Secondary | ICD-10-CM | POA: Diagnosis not present

## 2020-01-05 DIAGNOSIS — I4891 Unspecified atrial fibrillation: Secondary | ICD-10-CM

## 2020-01-05 DIAGNOSIS — I482 Chronic atrial fibrillation, unspecified: Secondary | ICD-10-CM

## 2020-01-05 LAB — PROTIME-INR: INR: 2.9 — AB (ref 0.9–1.1)

## 2020-01-05 NOTE — Telephone Encounter (Signed)
[facility] Visit Follow Up Request   Date of Request ([place]):  January 05, 2020  Requesting Provider:  Daneen Schick  Agency Requested:    [facility] Contact:  Glory Buff, NP [address] [place], [place] [address] Phone #:  [phone] Fax #:  [phone]  Patient Demographic Information: Name:  Garvin Ellena Age:  84 y.o.   DOB:  Aug 26, 1935  MRN:  [id number]   Home visit progress note(s), lab results, telemetry strips, etc were reviewed.  Current dose: Warfarin 5mg  daily except 2.5mg  on Thursdays. Pt to resume leafy veggies at least twice a week and remain consistent.   Provider Recommendations: INR check on 01/12/2020 call to [phone] fax to [phone]  Follow up home services requested:  Labs:  PT/INR  All labs ordered for this home visit have been released and the request was sent to [name] at [facility].

## 2020-01-05 NOTE — Telephone Encounter (Addendum)
Called pt to update him that Heart Failure Clinic sent a message back to confirm they will be able to obtain an INR on Thursday when he goes for his other lab appt. Pt stated okay and that he called [facility] to come get an INR today because he wasn't feeling well today and wanted to be sure INR is down. He stated [facility] advised for him to hold warfarin another day and he stated he did not cause he knows we manage his warfarin and he recalls what happened last time so he resumed on schedule. Went over how hold a day drops the INR a point and so hold three days decrease INR by 3 points; so he should be reset and we will await those labs. Advised that we will await those results and call once received but since it was drawn today we do not need another one on Thursday and we will evaluate that once once we receive the results from today. He states they came around 11am this morning. Also, he stated he received his Covid booster over the weekend and his wife wanted to know if this may be the cause of his INR issues, advised that the vaccine is fine and we appreciate him getting and this does not cause the INR to increase and he verbalized understanding.   Pt made aware that since labs were drawn today and we have a plan to resume normal dose and resume normal leafy veggies at least twice a week that we will recheck INR on 01/12/2020 then we will not recheck INR on Thursday but via [facility] on Monday. Pt is aware to call with any issue. I sent a note to [facility] Clinic to make aware that he will not need the labs on 01/08/2020 and canceled PT/INR order.

## 2020-01-05 NOTE — Patient Instructions (Signed)
Description   Spoke with patient and advised pt to continue taking Warfarin 1 tablet daily except 1/2 tablet on Thursdays. Pt to resume leafy veggies at least twice a week and remain consistent. Recheck INR in 1 week. [facility] [phone], Fax order to [facility] [phone] or send electronically.

## 2020-01-06 DIAGNOSIS — I5032 Chronic diastolic (congestive) heart failure: Secondary | ICD-10-CM | POA: Diagnosis not present

## 2020-01-08 ENCOUNTER — Ambulatory Visit (HOSPITAL_COMMUNITY)
Admission: RE | Admit: 2020-01-08 | Discharge: 2020-01-08 | Disposition: A | Discharge status: Home / Self Care | Payer: Medicare Other | Source: Ambulatory Visit | Attending: Internal Medicine | Admitting: Internal Medicine

## 2020-01-08 ENCOUNTER — Other Ambulatory Visit: Payer: Self-pay

## 2020-01-08 DIAGNOSIS — I5032 Chronic diastolic (congestive) heart failure: Secondary | ICD-10-CM | POA: Diagnosis not present

## 2020-01-08 DIAGNOSIS — I482 Chronic atrial fibrillation, unspecified: Secondary | ICD-10-CM

## 2020-01-08 LAB — BASIC METABOLIC PANEL
Anion gap: 13 (ref 5–15)
BUN: 75 mg/dL — ABNORMAL HIGH (ref 8–23)
CO2: 25 mmol/L (ref 22–32)
Calcium: 8.8 mg/dL — ABNORMAL LOW (ref 8.9–10.3)
Chloride: 98 mmol/L (ref 98–111)
Creatinine, Ser: 2.79 mg/dL — ABNORMAL HIGH (ref 0.61–1.24)
GFR calc non Af Amer: 20 mL/min — ABNORMAL LOW (ref >60)
Glucose, Bld: 147 mg/dL — ABNORMAL HIGH (ref 70–99)
Potassium: 4.3 mmol/L (ref 3.5–5.1)
Sodium: 136 mmol/L (ref 135–145)

## 2020-01-12 ENCOUNTER — Telehealth (HOSPITAL_COMMUNITY): Payer: Self-pay | Admitting: *Deleted

## 2020-01-12 NOTE — Telephone Encounter (Signed)
pts wife called to report pts legs are swelling again. Pt wear compression hose. Pt also complains of sob and cough. Weight up 4lbs. Pt taking all medications as prescribed.   Routed to [place] for advice

## 2020-01-13 ENCOUNTER — Other Ambulatory Visit: Payer: Self-pay

## 2020-01-13 ENCOUNTER — Ambulatory Visit (INDEPENDENT_AMBULATORY_CARE_PROVIDER_SITE_OTHER): Payer: Medicare Other

## 2020-01-13 DIAGNOSIS — I4891 Unspecified atrial fibrillation: Secondary | ICD-10-CM

## 2020-01-13 DIAGNOSIS — Z952 Presence of prosthetic heart valve: Secondary | ICD-10-CM | POA: Diagnosis not present

## 2020-01-13 LAB — PROTIME-INR
INR: 6 (ref 0.9–1.2)
Prothrombin Time: 59.8 s — ABNORMAL HIGH (ref 9.1–12.0)

## 2020-01-13 LAB — POCT INR: INR: 7.4 — AB (ref 2.0–3.0)

## 2020-01-13 NOTE — Telephone Encounter (Signed)
Would take extra dose of metolazone 2.5 mg with am torsemide tomorrow (take 2 doses of metolazone this week). Get him followup with APP soon please.

## 2020-01-13 NOTE — Patient Instructions (Addendum)
Description   INR 7.4, sent pt for STAT lab. Pt has not taken Warfarin dosage today, will hold Warfarin and await further instructions after lab result back. Go to ED with bleeding.  Lab results came back. Called pt and instructed him to hold Warfarin x 3 dosages, (10/12, 10/13, 10/14) then start taking 1 tablet daily except 1/2 tablet on Mondays, Wednesdays and Fridays.  Recheck INR in 1 week in office. [facility] [phone].

## 2020-01-13 NOTE — Telephone Encounter (Signed)
Pts wife aware states pt has an appt on 11/1 she will make sooner appt if pt is not doing any better.

## 2020-01-16 DIAGNOSIS — N139 Obstructive and reflux uropathy, unspecified: Secondary | ICD-10-CM | POA: Diagnosis not present

## 2020-01-16 DIAGNOSIS — R338 Other retention of urine: Secondary | ICD-10-CM | POA: Diagnosis not present

## 2020-01-18 ENCOUNTER — Other Ambulatory Visit (HOSPITAL_COMMUNITY): Payer: Self-pay | Admitting: Cardiology

## 2020-01-19 ENCOUNTER — Telehealth (HOSPITAL_COMMUNITY): Payer: Self-pay | Admitting: *Deleted

## 2020-01-19 NOTE — Telephone Encounter (Signed)
Pts wife left VM stating palliative care has not contacted them. I do not see where our office ordered palliative care but will follow up with Dr.McLean. Wife also concerned about "lump" on pts leg she said he slid out of bed and must've hit his leg because now he has a lump. The fire dept came and helped get him back in the bed. Lump is not open or bleeding but it is bruised and sore. The lump is on pts calf. She took of his compression hose as advised by fire dept because of the lump but wants to know when she should place them again.  Routed to [place] for advice

## 2020-01-19 NOTE — Telephone Encounter (Signed)
He can put the compression stockings back on, can have him come by office for nursing visit to look at "lump" if not improving.  I do not believe that I ordered palliative care for him, but reasonable to have them follow if that is what they want.

## 2020-01-19 NOTE — Telephone Encounter (Signed)
Pt aware- Referral placed.  

## 2020-01-20 ENCOUNTER — Ambulatory Visit (INDEPENDENT_AMBULATORY_CARE_PROVIDER_SITE_OTHER): Payer: Medicare Other | Admitting: *Deleted

## 2020-01-20 ENCOUNTER — Other Ambulatory Visit: Payer: Self-pay

## 2020-01-20 DIAGNOSIS — Z5181 Encounter for therapeutic drug level monitoring: Secondary | ICD-10-CM | POA: Diagnosis not present

## 2020-01-20 DIAGNOSIS — Z952 Presence of prosthetic heart valve: Secondary | ICD-10-CM

## 2020-01-20 DIAGNOSIS — I4891 Unspecified atrial fibrillation: Secondary | ICD-10-CM | POA: Diagnosis not present

## 2020-01-20 LAB — POCT INR: INR: 3.6 — AB (ref 2.0–3.0)

## 2020-01-20 NOTE — Patient Instructions (Signed)
Description   Take 1/2 a tablet today and then continue to take 1 tablet daily except for 1/2 a tablet on Monday, Wednesday and Friday. Recheck INR in 1 week. [facility] [phone]

## 2020-01-22 ENCOUNTER — Telehealth: Payer: Self-pay | Admitting: Internal Medicine

## 2020-01-22 NOTE — Telephone Encounter (Signed)
Sandy with [facility] said that she spoke with the patients wife this morning and the patient fell/ slid down out of the bed a couple days ago and there is a knot on his leg and it is seeping, the wife has been trying to wrap it and bandage it but was having trouble doing so. He is having trouble ambulating with his walker. The wife is under the impression that palliative care should be contacting them and coming to see the patient. They cannot see any referrals in regards to palliative care.    Please call Lovey Newcomer back at: [phone] option 6

## 2020-01-23 ENCOUNTER — Telehealth (HOSPITAL_COMMUNITY): Payer: Self-pay

## 2020-01-23 MED ORDER — METOLAZONE 2.5 MG PO TABS: 2.5000 mg | ORAL_TABLET | ORAL | 0 refills | Status: DC

## 2020-01-23 NOTE — Telephone Encounter (Signed)
Make sure he has had palliative care referral.    Increase metolazone to 2.5 mg twice a week (take a dose tomorrow morning).    Keep legs elevated.   See if we can get home health to do Kerlex wraps for his legs.   NEEDS WORK-IN APPT NEXT WEEK WITH ME OR NP/PA.

## 2020-01-23 NOTE — Addendum Note (Signed)
Addended by: Scarlette Calico on: 01/23/2020 05:16 PM   Modules accepted: Orders

## 2020-01-23 NOTE — Telephone Encounter (Signed)
Who was supposed to make referral to palliative care?  Does the patient need to go to the hospital/ER for his current condition? Let me know what you need to do. Thanks

## 2020-01-23 NOTE — Telephone Encounter (Signed)
Patients wife called to report that she is having difficulty putting patients support hose stockings on due to a lump that is about the size of a 50 cent piece that is draining(clear/pink mix) due to the patient falling out of bed earlier in the week, she also reports that he has been complaining of some SOB, he had been using his inhaler in the past but hasn't been recently. Patients wife also reports that she has noticed that Samar has some abdominal and bilateral lower extremity swelling. Patients wife wants to know when she should put his support hose back on and is there anything else she needs to do to help with the swelling. she has tried contacting his pcp but hasn't heard anything from them.    [phone]

## 2020-01-23 NOTE — Telephone Encounter (Signed)
Spoke w/pt, we discussed keeping legs elevated, he is already taking Metolazone twice a week and will increase to 3 times a week with a dose tomorrow. Pt is already sch to see [name] on Mon 11/1 and wishes to not come in sooner. Advised pt to call us back on Monday with an update on how he is doing and if not better we will need to bring him in sooner, he is agreeable to this. Pt states he has not heard from Palliative Care, I advised I would contact them again. Referral for Palliative called into [facility] and ref info also faxed to them at [phone].

## 2020-01-24 NOTE — Telephone Encounter (Signed)
Mrs. Deon Pilling should take her husband to the hospital.  Thank you

## 2020-01-26 NOTE — Telephone Encounter (Signed)
Patient has been advised to go to the ED.

## 2020-01-27 ENCOUNTER — Other Ambulatory Visit: Payer: Self-pay

## 2020-01-27 ENCOUNTER — Telehealth: Payer: Self-pay

## 2020-01-27 ENCOUNTER — Other Ambulatory Visit: Payer: Medicare Other

## 2020-01-27 VITALS — BP 94/48 | HR 63 | Wt 236.0 lb

## 2020-01-27 DIAGNOSIS — Z515 Encounter for palliative care: Secondary | ICD-10-CM

## 2020-01-27 NOTE — Progress Notes (Signed)
COMMUNITY PALLIATIVE CARE SW NOTE  PATIENT NAME: [name] DOB: [birth year] MRN: [id number]  PRIMARY CARE PROVIDER: Cassandria Anger, MD  RESPONSIBLE PARTY:  Acct ID - Guarantor Home Phone Work Phone Relationship Acct Type  [id number] Hamilton [id number]  Self P/F     [address], [place], [place] [address]     PLAN OF CARE and INTERVENTIONS:             1. GOALS OF CARE/ ADVANCE CARE PLANNING:  Patient is a full code. MOST form to be completed. Patients goal is to remain in the home as long as possible. 2. SOCIAL/EMOTIONAL/SPIRITUAL ASSESSMENT/ INTERVENTIONS:  SW and RN Almyra Free met with patient and patient's wife for initial palliative care visit. Patient lives in a two story home with master bedroom and bath on first floor. Patient and wife updated SW and RN medical condition and changes. Wife is primary caregiver. Patient has appointment with lawyer tomorrow to discuss living will. Patient had a fall/slip out of bed over a week ago. Patient now has an open area to lower right leg that is draining. Wife is cleaning area with warm water and soap and dressing with Neosporin. Wife requesting ABT cream for area. Patient eats well. Sleeps well. Patient states that he has some pain in lower leg where open wound is. Patient has foley cath placed and urology follow up in a couple weeks. RN reviewed medications and took vitals. RN outreached Dr. Claris Gladden office to request [facility] services due to conversation being had about the services prior with patient.  Patient recently had remote health services. SW discussed goals, reviewed care plan, provided emotional support, used active and reflective listening. Palliative care will continue to monitor and assist with long term care planning as needed, if remote health is not reconvened.  3. PATIENT/CAREGIVER EDUCATION/ COPING:  Patient A&O. and answered all questions appropriately. Patient denies any anxiety or depression. Patient's family is  supportive. Patient has 3 children (2 daughters and a son). 4. PERSONAL EMERGENCY PLAN:  Patient/wife will call 9-1-1 for emergencies.  5. COMMUNITY RESOURCES COORDINATION/ HEALTH CARE NAVIGATION:  Patient and wife manages his care. 6. FINANCIAL/LEGAL CONCERNS/INTERVENTIONS:  None.     SOCIAL HX:  Social History   Tobacco Use  . Smoking status: Former Smoker    Packs/day: 1.00    Years: 30.00    Pack years: 30.00    Types: Cigarettes    Quit date: 04/03/1992    Years since quitting: 27.8  . Smokeless tobacco: Never Used  Substance Use Topics  . Alcohol use: Yes    Comment: 4-6 ounces of wine daily    CODE STATUS: Full code  ADVANCED DIRECTIVES: N MOST FORM COMPLETE:  To be discussed HOSPICE EDUCATION PROVIDED: N  PPS: Patient is ambulatory with rollator. Patient has stair lift, WC, shower chair, RW.       Doreene Eland, [place]

## 2020-01-27 NOTE — Progress Notes (Signed)
PATIENT NAME: [name] DOB: 20-Oct-1935 MRN: [id number]  PRIMARY CARE PROVIDER: Cassandria Anger, MD  RESPONSIBLE PARTY:  Acct ID - Guarantor Home Phone Work Phone Relationship Acct Type  [id number] Hamilton [phone]  Self P/F     [address], [place], [place] [address]    PLAN OF CARE and INTERVENTIONS:               1.  GOALS OF CARE/ ADVANCE CARE PLANNING:  Remain at home with the assistance of his wife. Patient desires full code status with limited interventions.  He would like to complete a MOST form.               2.  PATIENT/CAREGIVER EDUCATION:  CHF, Safety, and Palliative Care Services.               3.  DISEASE STATUS:  Joint visit with [place], SW.  Greeted at the door by patient's wife [name].  Patient is found in the recliner chair in the sun room.  Patient is engaging in conversation and explains his medical history and desire to remain out of the hospital if possible.  Patient recalls being under Remote Care and participating in some therapy in addition to having a nursing visit frequently but was recently discharged from services.  Spouse and patient voiced concern over navigating the healthcare system and needing assistance with this.   On patient's most recent MD visit metolazone was increased due to worsening edema.  There has been about a 17 lb weight gain in the last 3 months.  Patient notes bilateral lower extremities and abdomen typically retain the fluid.  He is hopeful the increase in metolazone will help to remove the excess fluid.  He does have a follow up appt on Monday with Dr. [name].  Patient is using a rolling walker and has a wheelchair he uses when outside of the home. He currently has a foley cath in place and is awaiting a urology procedure in the coming week.  He reports a fall in the last couple of weeks, where he slid out of the bed.  As a result of the fall, he sustained a hematoma to the right lateral leg.  This area has begun to drain  sanguineous fluid.  Patient's wife is cleaning the area daily with Dial antibacterial soap and applying neosporin with a dressing daily.  In reviewing patient's chart, there was some discussion of having home health assist with wraps to the bilateral lower extremities.  Both wife and patient report this has not started.  I contacted Dr. Claris Gladden office and was advised that patient would need a face to face visit with MD for [facility] services to be started.  Since he will be seen on Monday, MD will address this issue at that time.  I advised patient that I would do a follow up call on next Wednesday.   HISTORY OF PRESENT ILLNESS:  84 year old male with history of chronic diastolic heart failure.  Patient is being followed by Palliative Care for monthly and PRN visits.  CODE STATUS: Full ADVANCED DIRECTIVES: N MOST FORM:No PPS: 50%   PHYSICAL EXAM:   VITALS:   BP 94/48 P 63 O2 Sats 96% Current weight 236 lbs LUNGS: CTA CARDIAC: HRR EXTREMITIES: 3-4 + pitting edema to bilateral lower extremities. SKIN: Right lower leg hematoma draining a small amount of sanguineous drainage.  Area cleansed and neosporin applied to site and wrapped. NEURO: Alert and oriented.  [name], RN

## 2020-01-27 NOTE — Telephone Encounter (Signed)
1030 am.  Phone call made to patient to schedule a Palliative Care visit.  Patient is requesting a visit as soon as possible.  Patient will be seen today at 1 pm.

## 2020-01-28 ENCOUNTER — Other Ambulatory Visit: Payer: Self-pay

## 2020-01-28 ENCOUNTER — Ambulatory Visit (INDEPENDENT_AMBULATORY_CARE_PROVIDER_SITE_OTHER): Payer: Medicare Other | Admitting: *Deleted

## 2020-01-28 DIAGNOSIS — I4891 Unspecified atrial fibrillation: Secondary | ICD-10-CM

## 2020-01-28 DIAGNOSIS — Z952 Presence of prosthetic heart valve: Secondary | ICD-10-CM

## 2020-01-28 LAB — POCT INR: INR: 4.2 — AB (ref 2.0–3.0)

## 2020-01-28 NOTE — Patient Instructions (Addendum)
Description   Do not take any Warfarin today then start taking 1/2 tablet daily except for 1 tablet on Tuesday and Saturday. Eat a serving of greens today and remain consistent. Recheck INR in 1 week. [facility] [phone] Fax [phone]

## 2020-01-29 ENCOUNTER — Other Ambulatory Visit: Payer: Medicare Other | Admitting: Hospice

## 2020-01-29 DIAGNOSIS — Z515 Encounter for palliative care: Secondary | ICD-10-CM

## 2020-01-29 NOTE — Progress Notes (Signed)
[facility] Consult Note Telephone: [phone]  Fax: [phone]  PATIENT NAME: [name] [address] [place] [address] [id number] (home)  DOB: Nov 25, 1935 MRN: [id number]  PRIMARY CARE PROVIDER:    Cassandria Anger, MD,  [place] [address] [phone]  REFERRING PROVIDER:   Cassandria Anger, MD [place],   [address] [phone]  RESPONSIBLE PARTY:  Self  No emergency contact information on file.  TELEHEALTH VISIT STATEMENT Due to the COVID-19 crisis, this visit was done via telephone from my office. It was initiated and consented to by this patient and/or family.  RECOMMENDATIONS/PLAN:    Visit at the request of [place] SW for advance care planning discussion. Patient's spouse was present during visit.  Advance Care Planning: Our advance care planning conversation included a discussion about:  The value and importance of advance care planning Exploration of goals of care in the event of a sudden injury or illness Identification and preparation of a healthcare agent Review and updating or creation of anadvance directive document  CODE STATUS: Ramifications and implications of CODE STATUS discussed.  Patient elected to be a PARTIAL CODE- desiring chest compressions (CPR), no intubation, no advanced airway interventions/mechanical ventilation.  GOALS OF CARE: Goals of care include to maximize quality of life and symptom management.  Discussion on medical orders for scope of treatment.  MOST selections include attempt resuscitation (CPR), limited additional interventions, IV fluids if indicated, antibiotics if indicated,  feeding tube for a defined trial period.  [place] SW to follow-up with patient for his signature.  I spent 46  minutes providing this initial consultation; time includes time spent with patient/family, chart review, provider  coordination,  and documentation. More than 50% of the time in this consultation was spent on coordinating communication  CHIEF COMPLAIN/HISTORY OF PRESENT ILLNESS: Telehealth visit for goals of care and advance care planning   Palliative Care was asked to follow this patient by consultation request of of [name], [name], MD/[place] Henrene Pastor MSW to help address advance care planning and goals of care.  CODE STATUS: Partial code   PAST MEDICAL HISTORY:  Past Medical History:  Diagnosis Date  . Carotid artery disease ([place]) 1994   s/p left carotid endarerectomy   . Chronic atrial fibrillation (HCC)    a. on coumadin   . Chronic diastolic CHF (congestive heart failure) ([place])   . Diabetes mellitus without complication ([place])    dx 2016  . Dyspnea   . Epistaxis   . Heart murmur   . Hx of CABG    a. 1994  . Hypertension   . OSA (obstructive sleep apnea)   . Rheumatic fever   . S/P AVR (aortic valve replacement)    a. mechanical valve 1996  . Subclavian bypass stenosis ([place])   . 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 Vitamin B12 deficiency     SOCIAL HX:  Social History   Tobacco Use  . Smoking status: Former Smoker    Packs/day: 1.00    Years: 30.00    Pack years: 30.00    Types: Cigarettes    Quit date: 04/03/1992    Years since quitting: 27.8  . Smokeless tobacco: Never Used  Substance Use Topics  . Alcohol use: Yes    Comment: 4-6 ounces of wine daily   FAMILY HX:  Family History  Problem Relation Age of  Onset  . Cancer Father        lung   . Hypertension Mother     ALLERGIES:  Allergies  Allergen Reactions  . Rifampin Rash and Other (See Comments)    May have been caused by Vancomycin or Rifampin (??)  . Vancomycin Rash and Other (See Comments)    May have been caused by Vancomycin or Rifampin (??)     Teodoro Spray, NP

## 2020-02-02 ENCOUNTER — Other Ambulatory Visit: Payer: Self-pay

## 2020-02-02 ENCOUNTER — Encounter (HOSPITAL_COMMUNITY): Payer: Self-pay | Admitting: Cardiology

## 2020-02-02 ENCOUNTER — Ambulatory Visit (HOSPITAL_COMMUNITY)
Admission: RE | Admit: 2020-02-02 | Discharge: 2020-02-02 | Disposition: A | Discharge status: Home / Self Care | Payer: Medicare Other | Source: Ambulatory Visit | Attending: Cardiology | Admitting: Cardiology

## 2020-02-02 VITALS — BP 98/60 | HR 62 | Wt 247.0 lb

## 2020-02-02 DIAGNOSIS — I5042 Chronic combined systolic (congestive) and diastolic (congestive) heart failure: Secondary | ICD-10-CM | POA: Diagnosis not present

## 2020-02-02 DIAGNOSIS — Z952 Presence of prosthetic heart valve: Secondary | ICD-10-CM | POA: Diagnosis not present

## 2020-02-02 DIAGNOSIS — I50812 Chronic right heart failure: Secondary | ICD-10-CM

## 2020-02-02 DIAGNOSIS — E1122 Type 2 diabetes mellitus with diabetic chronic kidney disease: Secondary | ICD-10-CM | POA: Diagnosis not present

## 2020-02-02 DIAGNOSIS — Z951 Presence of aortocoronary bypass graft: Secondary | ICD-10-CM | POA: Insufficient documentation

## 2020-02-02 DIAGNOSIS — I50810 Right heart failure, unspecified: Secondary | ICD-10-CM

## 2020-02-02 DIAGNOSIS — Z881 Allergy status to other antibiotic agents status: Secondary | ICD-10-CM | POA: Insufficient documentation

## 2020-02-02 DIAGNOSIS — Z8249 Family history of ischemic heart disease and other diseases of the circulatory system: Secondary | ICD-10-CM | POA: Diagnosis not present

## 2020-02-02 DIAGNOSIS — E039 Hypothyroidism, unspecified: Secondary | ICD-10-CM | POA: Insufficient documentation

## 2020-02-02 DIAGNOSIS — Z7901 Long term (current) use of anticoagulants: Secondary | ICD-10-CM | POA: Diagnosis not present

## 2020-02-02 DIAGNOSIS — I5032 Chronic diastolic (congestive) heart failure: Secondary | ICD-10-CM

## 2020-02-02 DIAGNOSIS — L97919 Non-pressure chronic ulcer of unspecified part of right lower leg with unspecified severity: Secondary | ICD-10-CM | POA: Insufficient documentation

## 2020-02-02 DIAGNOSIS — I340 Nonrheumatic mitral (valve) insufficiency: Secondary | ICD-10-CM | POA: Diagnosis not present

## 2020-02-02 DIAGNOSIS — S81801D Unspecified open wound, right lower leg, subsequent encounter: Secondary | ICD-10-CM

## 2020-02-02 DIAGNOSIS — I482 Chronic atrial fibrillation, unspecified: Secondary | ICD-10-CM

## 2020-02-02 DIAGNOSIS — J449 Chronic obstructive pulmonary disease, unspecified: Secondary | ICD-10-CM | POA: Diagnosis not present

## 2020-02-02 DIAGNOSIS — Z79899 Other long term (current) drug therapy: Secondary | ICD-10-CM | POA: Diagnosis not present

## 2020-02-02 DIAGNOSIS — G4733 Obstructive sleep apnea (adult) (pediatric): Secondary | ICD-10-CM | POA: Diagnosis not present

## 2020-02-02 DIAGNOSIS — D696 Thrombocytopenia, unspecified: Secondary | ICD-10-CM | POA: Diagnosis not present

## 2020-02-02 DIAGNOSIS — D649 Anemia, unspecified: Secondary | ICD-10-CM | POA: Insufficient documentation

## 2020-02-02 DIAGNOSIS — Z87891 Personal history of nicotine dependence: Secondary | ICD-10-CM | POA: Diagnosis not present

## 2020-02-02 DIAGNOSIS — I2510 Atherosclerotic heart disease of native coronary artery without angina pectoris: Secondary | ICD-10-CM | POA: Diagnosis not present

## 2020-02-02 DIAGNOSIS — N1832 Chronic kidney disease, stage 3b: Secondary | ICD-10-CM | POA: Insufficient documentation

## 2020-02-02 LAB — COMPREHENSIVE METABOLIC PANEL WITH GFR
ALT: 29 U/L (ref 0–44)
AST: 39 U/L (ref 15–41)
Albumin: 3.3 g/dL — ABNORMAL LOW (ref 3.5–5.0)
Alkaline Phosphatase: 151 U/L — ABNORMAL HIGH (ref 38–126)
Anion gap: 13 (ref 5–15)
BUN: 91 mg/dL — ABNORMAL HIGH (ref 8–23)
CO2: 25 mmol/L (ref 22–32)
Calcium: 8.8 mg/dL — ABNORMAL LOW (ref 8.9–10.3)
Chloride: 96 mmol/L — ABNORMAL LOW (ref 98–111)
Creatinine, Ser: 2.5 mg/dL — ABNORMAL HIGH (ref 0.61–1.24)
GFR, Estimated: 25 mL/min — ABNORMAL LOW
Glucose, Bld: 132 mg/dL — ABNORMAL HIGH (ref 70–99)
Potassium: 3.3 mmol/L — ABNORMAL LOW (ref 3.5–5.1)
Sodium: 134 mmol/L — ABNORMAL LOW (ref 135–145)
Total Bilirubin: 1.2 mg/dL (ref 0.3–1.2)
Total Protein: 6.8 g/dL (ref 6.5–8.1)

## 2020-02-02 MED ORDER — TORSEMIDE 100 MG PO TABS: 100.0000 mg | ORAL_TABLET | Freq: Two times a day (BID) | ORAL | 3 refills | Status: AC

## 2020-02-02 MED ORDER — POTASSIUM CHLORIDE CRYS ER 20 MEQ PO TBCR: EXTENDED_RELEASE_TABLET | ORAL | 3 refills | Status: DC

## 2020-02-02 MED ORDER — FUROSEMIDE 10 MG/ML IJ SOLN
80.0000 mg | Freq: Once | INTRAMUSCULAR | Status: AC
  Administered 2020-02-02: 80 mg via INTRAVENOUS

## 2020-02-02 NOTE — Progress Notes (Signed)
IV removed from patient at 1100. Patient tolerated well. Catheter was intact.

## 2020-02-02 NOTE — Addendum Note (Signed)
Encounter addended by: Malena Edman, RN on: 02/02/2020 11:08 AM  Actions taken: LDA properties accepted, Clinical Note Signed

## 2020-02-02 NOTE — Progress Notes (Signed)
Peripheral IV inserted in Right forearm at 1000. Patient tolerated well. 80mg  IV lasix administered. Patient tolerated well. Patient has foley catheter which was emptied for I&Os. Will continue to monitor.

## 2020-02-02 NOTE — Progress Notes (Signed)
Advanced Heart Failure Clinic Note   PCP: [name], [name], MD PCP-Cardiologist: [name], MD  HF: [name]  HPI: [name] is a 84 y.o. male with h/o mechanical AVR 1994, OSA on CPAP, Chronic afib, chronic combined CHF, CKD III, chronic coumadin therapy, CAD s/p CAGB 1994, and COPD.   Admitted 7/18-11/05/17 from [facility] office with A/C diastolic HF. Diuresed 83 lbs with lasix drip, metolazone, and diamox. Transitioned to torsemide 80 mg BID. Determined not to be a candidate for MitraClip. Dr [name] also consulted and recommended aggressive medical therapy versus  percutaneous MVR trial as he felt very high risk for conventional surgery. Course complicated by drop in hemoglobin and symptomatic hypotension, requiring 1 unit pRBCs. GI consulted and did not recommend scoping. BB and spiro were stopped due to low BPs. Had some AKI assoicated with low BP, but resolved by discharge. Required lovenox bridge at DC with subtherapeutic INR. Discharged with [facility]. DC weight: 216 lbs.   Admitted 9/11 - 12/14/17 for dental extractions in setting of work up for MVR. Hospital course complicated by run of VT in the setting of hypokalemia. Started on amiodarone with h/o of arrhythmia. Bridged with lovenox.   Admitted 9/22 - 12/28/17 with symptomatic anemia from bleeding gums. Bleeding stopped with holding heparin and coumadin. Received multiple units of blood.   He was admitted again in 10/19 with left thigh hematoma and went from there to inpatient rehab.   He has been evaluated in [place] for a transcatheter mitral valve.  RHC/LHC was done in 1/20 as part of his pre-op evaluation.  Filling pressures and cardiac output were well-compensated, he had patent SVG-PDA.  Echo in 1/20 showed EF 40-45%, moderate RV dilation with normal systolic function, severe MR with likely rheumatic mitral valve, normal St Jude mechanical aortic valve. Unfortunately, it was determined that he would not qualify for any of  the percutaneous mitral valve studies.    His diuretic doses have been up and down based on volume retention and creatinine.   Echo in 4/21 showed EF 50-55% with moderate LV dilation, severe MR, mildly decreased RV systolic function, dilated IVC, stable mechanical aortic valve.   He returns for followup of diastolic CHF, mechanical aortic valve, chronic atrial fibrillation, and CAD.  A couple months ago, weight was down and creatinine was up.  Metolazone was stopped and torsemide cut back.  Since last appointment, weight is up 28 lbs.  He has worsening peripheral edema, hard to walk.  Not short of breath at rest but dyspneic with any exertion. Wife helps with ADLs.  He has been back on metolazone three times a week for about 2 wks now.  He is orthopneic.  No chest pain. No lightheadedness.  Recent mechanical fall with right leg wound that is draining and not healing well. He has had urinary retention and now has an indwelling foley (follows with urology).      Labs (10/19): hgb 9.2 Labs (11/19): K 3.2, creatinine 1.31 Labs (1/20): K 4.5, creatinine 1.38 Labs (2/20): LDL 64 Labs (6/20): K 4.1, creatinine 1.8, hgb 12.9 Labs (1/21): LDL 64 Labs (2/21): K 4, creatinine 2.9 => 2.47 Labs (3/21): K 4.9, creatinine 3 => 2.92, LFTs normal, elevated TSH with normal free T3 and T4 Labs (5/21): hgb 10, plts 53 Labs (6/21): K 3.7, creatinine 2.16 Labs (10/21): K 4.3, creatinine 3.19 => 2.79  ROS:  All systems reviewed and negative except as per HPI.    PMH: 1. Carotid  stenosis: S/p left CEA in 1994.  2. Atrial fibrillation: Chronic.  He is on warfarin with mechanical aortic valve.  3. Type II diabetes.  4. COPD: PFTs (11/19) with moderate obstructive airways disease and severely decreased DLCO.  5. OSA: Bipap recommended 6. 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  7. ITP 8. VT: On amiodarone.  9. 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  10. Rheumatic mitral valve disease: TEE (7/19) showed severe MR  with minimal mitral stenosis, suspect rheumatic mitral valve disease.  11. Chronic systolic CHF: TEE (1/61) with EF 55-60%, D-shaped septum, mildly dilated RV with mildly decreased systolic function, mechanical aortic valve, rheumatic-appearing mitral valve with severe MR, minimal stenosis.  - RHC (1/20): mean RA 6, PA 54/20, mean PCWP 15, CI 3.42, PVR 2.1 WU, PAPI 5.2 - Echo (1/20): EF 40-45%, moderate RV dilation with normal systolic function, severe biatrial enlargement, severe rheumatic MR, St Jude mechanical aortic valve functioning normally.  - Echo (3/21): EF 50-55% with moderate LV dilation, severe MR, mildly decreased RV systolic function, dilated IVC, stable mechanical aortic valve.  12. ABIs (12/18): Normal.  13. CAD: S/p CABG 1994.  - LHC (1/20): 50% D1, 60% ostial LCx, totally occluded RCA with patent SVG-PDA.     Current Outpatient Medications  Medication Sig Dispense Refill  . acetaminophen (TYLENOL) 500 MG tablet Take 1,000 mg by mouth every 6 (six) hours as needed for moderate pain or headache.    . albuterol (PROAIR HFA) 108 (90 Base) MCG/ACT inhaler Inhale 2 puffs into the lungs every 4 (four) hours as needed for wheezing or shortness of breath. 18 g 5  . allopurinol (ZYLOPRIM) 100 MG tablet TAKE 1 TABLET BY MOUTH EVERY DAY 90 tablet 3  . amiodarone (PACERONE) 200 MG tablet Take 1 tablet (200 mg total) by mouth daily. 90 tablet 3  . carvedilol (COREG) 3.125 MG tablet Take 1 tablet (3.125 mg total) by mouth 2 (two) times daily. 180 tablet 3  . ezetimibe-simvastatin (VYTORIN) 10-20 MG tablet TAKE 1 TABLET BY MOUTH EVERYDAY AT BEDTIME 90 tablet 3  . finasteride (PROSCAR) 5 MG tablet Take 5 mg by mouth daily.    Marland Kitchen levothyroxine (SYNTHROID) 50 MCG tablet Take 1.5 tablets (75 mcg total) by mouth daily before breakfast. 90 tablet 3  . metolazone (ZAROXOLYN) 2.5 MG tablet Take 1 tablet (2.5 mg total) by mouth 3 (three) times a week. Take 1 tablet twice weekly. 13 tablet 0  .  polyethylene glycol powder (GLYCOLAX/MIRALAX) powder Take 17 g by mouth daily as needed for moderate constipation.    . potassium chloride SA (KLOR-CON M20) 20 MEQ tablet Take 3 tablets (60 mEq total) by mouth in the morning AND 2 tablets (40 mEq total) daily after supper. 150 tablet 3  . tamsulosin (FLOMAX) 0.4 MG CAPS capsule Take 0.4 mg by mouth daily after supper.     . torsemide (DEMADEX) 100 MG tablet Take 1 tablet (100 mg total) by mouth 2 (two) times daily. 60 tablet 3  . triamcinolone ointment (KENALOG) 0.1 % Apply 1 application topically 2 (two) times daily. (Patient taking differently: Apply 1 application topically daily as needed. ) 80 g 1  . warfarin (COUMADIN) 5 MG tablet TAKE  AS DIRECTED BY COUMADIN CLINIC 100 tablet 1   Current Facility-Administered Medications  Medication Dose Route Frequency Provider Last Rate Last Admin  . furosemide (LASIX) injection 80 mg  80 mg Intravenous Once Larey Dresser, MD       Allergies  Allergen  Reactions  . Rifampin Rash and Other (See Comments)    May have been caused by Vancomycin or Rifampin (??)  . Vancomycin Rash and Other (See Comments)    May have been caused by Vancomycin or Rifampin (??)   Social History   Socioeconomic History  . Marital status: Married    Spouse name: Not on file  . Number of children: 3  . Years of education: Not on file  . Highest education level: Not on file  Occupational History  . Not on file  Tobacco Use  . Smoking status: Former Smoker    Packs/day: 1.00    Years: 30.00    Pack years: 30.00    Types: Cigarettes    Quit date: 04/03/1992    Years since quitting: 27.8  . Smokeless tobacco: Never Used  Vaping Use  . Vaping Use: Never used  Substance and Sexual Activity  . Alcohol use: Yes    Comment: 4-6 ounces of wine daily  . Drug use: No    Comment: Half a cup a day.  Marland Kitchen Sexual activity: Not on file  Other Topics Concern  . Not on file  Social History Narrative   Patient is married and  lives with his wife.   Youngest daughter lives in [place] also.   Patient has 2 other children.   Social Determinants of Health   Financial Resource Strain:   . Difficulty of Paying Living Expenses: Not on file  Food Insecurity:   . Worried About Charity fundraiser in the Last Year: Not on file  . Ran Out of Food in the Last Year: Not on file  Transportation Needs:   . Lack of Transportation (Medical): Not on file  . Lack of Transportation (Non-Medical): Not on file  Physical Activity:   . Days of Exercise per Week: Not on file  . Minutes of Exercise per Session: Not on file  Stress:   . Feeling of Stress : Not on file  Social Connections:   . Frequency of Communication with Friends and Family: Not on file  . Frequency of Social Gatherings with Friends and Family: Not on file  . Attends Religious Services: Not on file  . Active Member of Clubs or Organizations: Not on file  . Attends Archivist Meetings: Not on file  . Marital Status: Not on file  Intimate Partner Violence:   . Fear of Current or Ex-Partner: Not on file  . Emotionally Abused: Not on file  . Physically Abused: Not on file  . Sexually Abused: Not on file    Family History  Problem Relation Age of Onset  . Cancer Father        lung   . Hypertension Mother    Vitals:   02/02/20 0916 02/02/20 1016  BP: 102/60 100/60  Pulse: 73 62  SpO2: 98% 96%  Weight: 112 kg (247 lb)      Wt Readings from Last 3 Encounters:  02/02/20 112 kg (247 lb)  01/27/20 107 kg (236 lb)  12/02/19 99.3 kg (219 lb)   PHYSICAL EXAM: General: NAD Neck: JVP 14 cm, no thyromegaly or thyroid nodule.  Lungs: Clear to auscultation bilaterally with normal respiratory effort. CV: Nondisplaced PMI.  Heart irregular S1/S2 with mechanical S1, 3/6 HSM apex.  2+ edema to thighs.  No carotid bruit.  Difficult to palpate pedal pulses.  Abdomen: Soft, nontender, no hepatosplenomegaly, no distention.  Skin: Intact without lesions  or rashes.  Neurologic: Alert and  oriented x 3.  Psych: Normal affect. Extremities: No clubbing or cyanosis. Ulcer on right lower leg (dressed).  HEENT: Normal.    ASSESSMENT & PLAN: 1. Chronic systolic CHF with prominent RV failure:  Echo in 3/21 with EF 50-55%, mildly decreased RV systolic function, severe rheumatic mitral regurgitation, normal mechanical aortic valve.  Severe MR likely plays a significant role in CHF and RV failure. NYHA class IV.  His weight is up 28 lbs and he is markedly volume overloaded.  We had cut back on diuretics with elevated creatinine.  - I will give him Lasix 80 mg IV x 1 this morning to replace am torsemide.  He will increase torsemide to 100 mg bid.  - He will continue metolazone 2.5 mg tiw on Tues/Thurs/Sat.  - I will have paramedicine start following him, he will get IV Lasix 80 mg again on Wednesday to replace am torsemide.  - Increase KCl to 60 qam/40 qpm.  - He is off spironolactone with elevated creatinine.  - Continue Coreg 3.125 mg bid. - Will try to use paramedicine/home health to avoid admission, but may end up needing to come in.   - BMET today and again in 1 week.  2. Mechanical aortic valve: Appeared to function well on last echo.  - Continue warfarin with INR goal 2.5-3.5 (also with atrial fibrillation).  - He is off aspirin with spontaneous thigh hematoma.   3. Atrial fibrillation: Chronic, rate is controlled.  4. CAD: s/p CABG.  No chest pain.  Cath in 1/20 with patent SVG-PDA, no interventional target.  - No ASA given stable CAD with warfarin use.   - Continue Vytorin, good lipids in 1/21.  5. COPD: He is no longer using oxygen during the day.  Moderate COPD on 11/19 PFTs.   6. OSA: Says he cannot tolerate bipap.  7.CKD Stage 3b: BMET today. Will need to follow closely.  8. Thrombocytopenia: Chronic, has been mild/stable.   9. Severe MR: TEE 7/19 with severe MR with possible rheumatic MV (do not think MS is significant, mildly elevated  mean gradient from high flow with severe MR).  Structural Heart team evaluated, not a Mitraclip candidate. Seen by Dr. [name], would be high risk for open surgery.  He was evaluated at [facility] in [place] for percutaneous mitral valve trials.  Unfortunately, he does not qualify for any of the available trials.  We will have to continue medical management.  10. VT: He is on amiodarone to suppress VT.  - Check LFTs.  Hypothyroidism followed by PCP.  Will need regular eye exam on amiodarone.  11. Anemia: Melena noted in past when INR was 10, now resolved.  12. RLE wound: I will refer him to wound care clinic.  Poor healing in setting of edematous leg.   Followup with NP/PA in 1 week.   Loralie Champagne 02/02/2020

## 2020-02-02 NOTE — Addendum Note (Signed)
Encounter addended by: Stanford Scotland, RN on: 02/02/2020 10:55 AM  Actions taken: Vitals modified

## 2020-02-02 NOTE — Addendum Note (Signed)
Encounter addended by: Stanford Scotland, RN on: 02/02/2020 11:00 AM  Actions taken: Clinical Note Signed

## 2020-02-02 NOTE — Progress Notes (Signed)
After receiving Iv Lasix 80 mg ,pt output is 250 cc,pt without any complaints.

## 2020-02-02 NOTE — Patient Instructions (Addendum)
INCREASE Torsemide 100mg  (1 tablet) twice daily  INCREASE KLOR 49meq (3 tablets) every morning and 91meq (2 tablets) every evening.  Continue Metolazone 2.5mg  with morning Torsemide Tuesday, Thursday, and Saturday  You have been referred to [facility]. They will contact you to schedule an appointment  Labs done today, your results will be available in MyChart, we will contact you for abnormal readings.  We have administered IV Lasix today. You have been referred to Paramedicine. They will contact you to come to your home and administer another dose of Lasix on Wednesday.  Your physician recommends that you return in 1 week for labs and follow up appointment  If you have any questions or concerns before your next appointment please send [place] a message through [place] or call our office at [phone].    TO LEAVE A MESSAGE FOR THE NURSE SELECT OPTION 2, PLEASE LEAVE A MESSAGE INCLUDING: . YOUR NAME . DATE OF BIRTH . CALL BACK NUMBER . REASON FOR CALL**this is important as we prioritize the call backs  YOU WILL RECEIVE A CALL BACK THE SAME DAY AS LONG AS YOU CALL BEFORE 4:00 PM

## 2020-02-04 ENCOUNTER — Other Ambulatory Visit: Payer: Self-pay

## 2020-02-04 ENCOUNTER — Other Ambulatory Visit (HOSPITAL_COMMUNITY): Payer: Self-pay

## 2020-02-04 ENCOUNTER — Telehealth: Payer: Self-pay

## 2020-02-04 ENCOUNTER — Ambulatory Visit (HOSPITAL_COMMUNITY)
Admission: RE | Admit: 2020-02-04 | Discharge: 2020-02-04 | Disposition: A | Discharge status: Home / Self Care | Payer: Medicare Other | Source: Ambulatory Visit | Attending: Cardiology | Admitting: Cardiology

## 2020-02-04 DIAGNOSIS — I50812 Chronic right heart failure: Secondary | ICD-10-CM | POA: Insufficient documentation

## 2020-02-04 LAB — BASIC METABOLIC PANEL
Anion gap: 12 (ref 5–15)
BUN: 92 mg/dL — ABNORMAL HIGH (ref 8–23)
CO2: 25 mmol/L (ref 22–32)
Calcium: 9 mg/dL (ref 8.9–10.3)
Chloride: 95 mmol/L — ABNORMAL LOW (ref 98–111)
Creatinine, Ser: 2.77 mg/dL — ABNORMAL HIGH (ref 0.61–1.24)
GFR, Estimated: 22 mL/min — ABNORMAL LOW (ref >60)
Glucose, Bld: 131 mg/dL — ABNORMAL HIGH (ref 70–99)
Potassium: 4 mmol/L (ref 3.5–5.1)
Sodium: 132 mmol/L — ABNORMAL LOW (ref 135–145)

## 2020-02-04 NOTE — Telephone Encounter (Signed)
443 pm.  Phone call made to patient's home to follow up on overall condition and recent MD visit.  Wife [name] answers the phone and advised that patient was seen on Monday but Dr. [name] and lasix treatment was completed.  Patient was seen in home today for an additional treatment.  She states patient is feeling much better with the recent treatments.  Wife is continuing to provide wound care to the right lower leg.  Plan is for patient to be followed by the wound clinic.  Wife has not heard from the clinic yet but was advised that she would receive a call from them.  I have re-enforced palliative care services and advised the Palliative Care would contact patient again for a follow up visit next month.  Wife advised to call Palliative Care should a visit be required sooner.

## 2020-02-04 NOTE — Progress Notes (Signed)
Paramedicine Encounter    Patient ID: [name], male    DOB: [birth year], 84 y.o.   MRN: [id number]   Patient Care Team: Cassandria Anger, MD as PCP - General (Internal Medicine) Belva Crome, MD as PCP - Cardiology (Cardiology) Sueanne Margarita, MD as PCP - Sleep Medicine (Sleep Medicine) Brunetta Genera, MD as Consulting Physician (Hematology) Belva Crome, MD as Consulting Physician (Cardiology) Elayne Snare, MD as Consulting Physician (Endocrinology) Lavonna Monarch, MD as Consulting Physician (Dermatology)  Patient Active Problem List   Diagnosis Date Noted  . Skin cancer 06/23/2019  . Bruising 03/25/2019  . Hypothyroidism 09/24/2018  . Acute on chronic renal failure ([place]) 01/31/2018  . Debility 01/18/2018  . Anemia 01/09/2018  . Foot pain, left 01/07/2018  . Dysuria 01/07/2018  . Severe anemia 12/23/2017  . Hyponatremia 12/23/2017  . Coronary artery disease with history of myocardial infarction without history of CABG 12/23/2017  . Bleeding from mouth 12/20/2017  . Post-op pain 12/12/2017  . Dental caries 12/12/2017  . Severe mitral regurgitation 11/14/2017  . Pressure injury of skin 10/29/2017  . Shortness of breath   . Palliative care by specialist   . RVF (right ventricular failure) ([place])   . Right heart failure ([place]) 10/18/2017  . Hypoxia   . COPD exacerbation ([place]) 08/01/2017  . Pancytopenia ([place]) 08/01/2017  . Anticoagulation goal of INR 1.5 to 2.5 07/16/2017  . Epistaxis 07/11/2017  . Actinic keratoses 04/05/2017  . Leg wound, right 02/18/2016  . Peripheral edema 02/18/2016  . Chronic anticoagulation 02/18/2016  . Hyperkalemia 07/25/2015  . Atherosclerosis of native arteries of extremity with intermittent claudication ([place]) 10/21/2014  . Thrombocytopenia ([place]) 10/20/2014  . Macrocytosis without anemia 10/20/2014  . OSA (obstructive sleep apnea) 05/15/2013  . Goals of care, counseling/discussion 05/01/2013  . History of mechanical aortic  valve replacement 01/02/2013  . Obesity (BMI 30-39.9) 10/20/2012  . Type II diabetes mellitus with complication ([place]) 44/04/270  . Acute asthmatic bronchitis 01/18/2012  . ACUTE KIDNEY FAILURE UNSPECIFIED 02/04/2009  . CUTANEOUS ERUPTIONS, DRUG-INDUCED 02/04/2009  . HLD (hyperlipidemia) 02/03/2009  . Essential hypertension 02/03/2009  . Coronary atherosclerosis 02/03/2009  . Atrial fibrillation ([place]) 02/03/2009  . Chronic diastolic heart failure ([place]) 02/03/2009  . CAROTID ENDARTERECTOMY, LEFT, HX OF 02/03/2009  . INF&INFLAM REACT DUE CARD DEVICE IMPLANT&GRAFT 12/30/2008    Current Outpatient Medications:  .  acetaminophen (TYLENOL) 500 MG tablet, Take 1,000 mg by mouth every 6 (six) hours as needed for moderate pain or headache., Disp: , Rfl:  .  albuterol (PROAIR HFA) 108 (90 Base) MCG/ACT inhaler, Inhale 2 puffs into the lungs every 4 (four) hours as needed for wheezing or shortness of breath., Disp: 18 g, Rfl: 5 .  allopurinol (ZYLOPRIM) 100 MG tablet, TAKE 1 TABLET BY MOUTH EVERY DAY, Disp: 90 tablet, Rfl: 3 .  amiodarone (PACERONE) 200 MG tablet, Take 1 tablet (200 mg total) by mouth daily., Disp: 90 tablet, Rfl: 3 .  carvedilol (COREG) 3.125 MG tablet, Take 1 tablet (3.125 mg total) by mouth 2 (two) times daily., Disp: 180 tablet, Rfl: 3 .  ezetimibe-simvastatin (VYTORIN) 10-20 MG tablet, TAKE 1 TABLET BY MOUTH EVERYDAY AT BEDTIME, Disp: 90 tablet, Rfl: 3 .  finasteride (PROSCAR) 5 MG tablet, Take 5 mg by mouth daily., Disp: , Rfl:  .  levothyroxine (SYNTHROID) 50 MCG tablet, Take 1.5 tablets (75 mcg total) by mouth daily before breakfast., Disp: 90 tablet, Rfl: 3 .  metolazone (ZAROXOLYN) 2.5 MG tablet, Take 1  tablet (2.5 mg total) by mouth 3 (three) times a week. Take 1 tablet twice weekly. (Patient taking differently: Take 2.5 mg by mouth 3 (three) times a week. ), Disp: 13 tablet, Rfl: 0 .  polyethylene glycol powder (GLYCOLAX/MIRALAX) powder, Take 17 g by mouth daily as needed  for moderate constipation., Disp: , Rfl:  .  potassium chloride SA (KLOR-CON M20) 20 MEQ tablet, Take 3 tablets (60 mEq total) by mouth in the morning AND 2 tablets (40 mEq total) daily after supper., Disp: 150 tablet, Rfl: 3 .  tamsulosin (FLOMAX) 0.4 MG CAPS capsule, Take 0.4 mg by mouth daily after supper. , Disp: , Rfl:  .  torsemide (DEMADEX) 100 MG tablet, Take 1 tablet (100 mg total) by mouth 2 (two) times daily., Disp: 60 tablet, Rfl: 3 .  triamcinolone ointment (KENALOG) 0.1 %, Apply 1 application topically 2 (two) times daily. (Patient taking differently: Apply 1 application topically daily as needed. ), Disp: 80 g, Rfl: 1 .  warfarin (COUMADIN) 5 MG tablet, TAKE  AS DIRECTED BY COUMADIN CLINIC, Disp: 100 tablet, Rfl: 1 Allergies  Allergen Reactions  . Rifampin Rash and Other (See Comments)    May have been caused by Vancomycin or Rifampin (??)  . Vancomycin Rash and Other (See Comments)    May have been caused by Vancomycin or Rifampin (??)      Social History   Socioeconomic History  . Marital status: Married    Spouse name: Not on file  . Number of children: 3  . Years of education: Not on file  . Highest education level: Not on file  Occupational History  . Not on file  Tobacco Use  . Smoking status: Former Smoker    Packs/day: 1.00    Years: 30.00    Pack years: 30.00    Types: Cigarettes    Quit date: 04/03/1992    Years since quitting: 27.8  . Smokeless tobacco: Never Used  Vaping Use  . Vaping Use: Never used  Substance and Sexual Activity  . Alcohol use: Yes    Comment: 4-6 ounces of wine daily  . Drug use: No    Comment: Half a cup a day.  Marland Kitchen Sexual activity: Not on file  Other Topics Concern  . Not on file  Social History Narrative   Patient is married and lives with his wife.   Youngest daughter lives in [place] also.   Patient has 2 other children.   Social Determinants of Health   Financial Resource Strain:   . Difficulty of Paying Living  Expenses: Not on file  Food Insecurity:   . Worried About Charity fundraiser in the Last Year: Not on file  . Ran Out of Food in the Last Year: Not on file  Transportation Needs:   . Lack of Transportation (Medical): Not on file  . Lack of Transportation (Non-Medical): Not on file  Physical Activity:   . Days of Exercise per Week: Not on file  . Minutes of Exercise per Session: Not on file  Stress:   . Feeling of Stress : Not on file  Social Connections:   . Frequency of Communication with Friends and Family: Not on file  . Frequency of Social Gatherings with Friends and Family: Not on file  . Attends Religious Services: Not on file  . Active Member of Clubs or Organizations: Not on file  . Attends Archivist Meetings: Not on file  . Marital Status: Not on file  Intimate Partner Violence:   . Fear of Current or Ex-Partner: Not on file  . Emotionally Abused: Not on file  . Physically Abused: Not on file  . Sexually Abused: Not on file    Physical Exam      Future Appointments  Date Time Provider [place]  02/06/2020 10:30 AM [facility]  02/09/2020  9:00 AM Larey Dresser, MD [facility] None  05/18/2020  8:45 AM [facility] None  05/18/2020  9:20 AM Brunetta Genera, MD [facility] None    BP (!) 98/56   Pulse 68   Resp 20   Wt 226 lb (102.5 kg)   SpO2 98%   BMI 29.02 kg/m   Weight Monday 247 Last visit weight-247 @ clinic   Here today for IV lasix.  Got a 22g in rt forearm and administered 80 mg of lasix at 1050-he then produced urine at 1130. He states his breathing is doing the same as the past week. His legs are wrapped. He was able to weigh and his weight appears to be coming down.  Need to see if his insurance will cover home INR machine-they are interested.  meds verified. He takes pills from bottles, he prefers not to use pillbox.   Marylouise Stacks,  [facility] Paramedic  02/04/20

## 2020-02-06 ENCOUNTER — Other Ambulatory Visit: Payer: Self-pay

## 2020-02-06 ENCOUNTER — Ambulatory Visit (INDEPENDENT_AMBULATORY_CARE_PROVIDER_SITE_OTHER): Payer: Medicare Other | Admitting: *Deleted

## 2020-02-06 DIAGNOSIS — Z952 Presence of prosthetic heart valve: Secondary | ICD-10-CM

## 2020-02-06 DIAGNOSIS — I4891 Unspecified atrial fibrillation: Secondary | ICD-10-CM

## 2020-02-06 DIAGNOSIS — Z5181 Encounter for therapeutic drug level monitoring: Secondary | ICD-10-CM | POA: Diagnosis not present

## 2020-02-06 LAB — POCT INR: INR: 2.7 (ref 2.0–3.0)

## 2020-02-06 NOTE — Patient Instructions (Addendum)
Description   Continue taking 1/2 tablet daily except for 1 tablet on Tuesday and Saturday. Be consistent with your 3-4 servings of greens per week. Recheck INR on 11/9, 3 days after starting doxy.  [facility] Clinic [phone] Fax [phone]

## 2020-02-09 ENCOUNTER — Encounter (HOSPITAL_COMMUNITY): Payer: Self-pay | Admitting: Cardiology

## 2020-02-09 ENCOUNTER — Other Ambulatory Visit: Payer: Self-pay

## 2020-02-09 ENCOUNTER — Ambulatory Visit (HOSPITAL_COMMUNITY)
Admission: RE | Admit: 2020-02-09 | Discharge: 2020-02-09 | Disposition: A | Discharge status: Home / Self Care | Payer: Medicare Other | Source: Ambulatory Visit | Attending: Cardiology | Admitting: Cardiology

## 2020-02-09 ENCOUNTER — Other Ambulatory Visit (HOSPITAL_COMMUNITY): Payer: Self-pay

## 2020-02-09 VITALS — BP 122/72 | HR 66 | Wt 257.4 lb

## 2020-02-09 DIAGNOSIS — Z952 Presence of prosthetic heart valve: Secondary | ICD-10-CM | POA: Insufficient documentation

## 2020-02-09 DIAGNOSIS — D649 Anemia, unspecified: Secondary | ICD-10-CM | POA: Diagnosis not present

## 2020-02-09 DIAGNOSIS — Z79899 Other long term (current) drug therapy: Secondary | ICD-10-CM | POA: Diagnosis not present

## 2020-02-09 DIAGNOSIS — Z79890 Hormone replacement therapy (postmenopausal): Secondary | ICD-10-CM | POA: Insufficient documentation

## 2020-02-09 DIAGNOSIS — Z7901 Long term (current) use of anticoagulants: Secondary | ICD-10-CM | POA: Insufficient documentation

## 2020-02-09 DIAGNOSIS — I5042 Chronic combined systolic (congestive) and diastolic (congestive) heart failure: Secondary | ICD-10-CM | POA: Insufficient documentation

## 2020-02-09 DIAGNOSIS — E1122 Type 2 diabetes mellitus with diabetic chronic kidney disease: Secondary | ICD-10-CM | POA: Diagnosis not present

## 2020-02-09 DIAGNOSIS — J449 Chronic obstructive pulmonary disease, unspecified: Secondary | ICD-10-CM | POA: Diagnosis not present

## 2020-02-09 DIAGNOSIS — G4733 Obstructive sleep apnea (adult) (pediatric): Secondary | ICD-10-CM | POA: Diagnosis not present

## 2020-02-09 DIAGNOSIS — I2510 Atherosclerotic heart disease of native coronary artery without angina pectoris: Secondary | ICD-10-CM | POA: Diagnosis not present

## 2020-02-09 DIAGNOSIS — E039 Hypothyroidism, unspecified: Secondary | ICD-10-CM | POA: Insufficient documentation

## 2020-02-09 DIAGNOSIS — D696 Thrombocytopenia, unspecified: Secondary | ICD-10-CM | POA: Insufficient documentation

## 2020-02-09 DIAGNOSIS — Z87891 Personal history of nicotine dependence: Secondary | ICD-10-CM | POA: Diagnosis not present

## 2020-02-09 DIAGNOSIS — I5032 Chronic diastolic (congestive) heart failure: Secondary | ICD-10-CM | POA: Diagnosis not present

## 2020-02-09 DIAGNOSIS — N1832 Chronic kidney disease, stage 3b: Secondary | ICD-10-CM | POA: Diagnosis not present

## 2020-02-09 DIAGNOSIS — Z951 Presence of aortocoronary bypass graft: Secondary | ICD-10-CM | POA: Insufficient documentation

## 2020-02-09 DIAGNOSIS — I482 Chronic atrial fibrillation, unspecified: Secondary | ICD-10-CM | POA: Diagnosis not present

## 2020-02-09 DIAGNOSIS — I340 Nonrheumatic mitral (valve) insufficiency: Secondary | ICD-10-CM

## 2020-02-09 LAB — BASIC METABOLIC PANEL
Anion gap: 11 (ref 5–15)
BUN: 98 mg/dL — ABNORMAL HIGH (ref 8–23)
CO2: 27 mmol/L (ref 22–32)
Calcium: 9 mg/dL (ref 8.9–10.3)
Chloride: 95 mmol/L — ABNORMAL LOW (ref 98–111)
Creatinine, Ser: 2.9 mg/dL — ABNORMAL HIGH (ref 0.61–1.24)
GFR, Estimated: 21 mL/min — ABNORMAL LOW (ref >60)
Glucose, Bld: 135 mg/dL — ABNORMAL HIGH (ref 70–99)
Potassium: 4.8 mmol/L (ref 3.5–5.1)
Sodium: 133 mmol/L — ABNORMAL LOW (ref 135–145)

## 2020-02-09 MED ORDER — METOLAZONE 5 MG PO TABS: 5.0000 mg | ORAL_TABLET | ORAL | 5 refills | Status: AC

## 2020-02-09 MED ORDER — FUROSEMIDE 10 MG/ML IJ SOLN
80.0000 mg | Freq: Once | INTRAMUSCULAR | Status: AC
  Administered 2020-02-09: 80 mg via INTRAVENOUS

## 2020-02-09 NOTE — Progress Notes (Signed)
Paramedicine Encounter   Patient ID: [name] , male,   DOB: 1935-05-22,84 y.o.,  MRN: [id number]   Met patient in clinic today with provider.  Weight @ clinic-257 B/p-122/72 p-66 sp02-94  He is c/o sob still, his weight is up. He feels like he is urinating ok but no water is coming off.  A lot of this fluid is from a leaky valve he has and it has become very difficult to manage the fluid.  IV lasix today in clinic- IV lasix again for Wednesday and Friday.  Moving forward after this week it will be IV lasix on Monday and Friday.  Metolazone will be increased to 30m on tues/thurs/sat.  He has CKD. Dr mAundra Dubinwill check his labs in a couple wks.  He may not be a candidate for dialysis due to the degree of CHF he has. He has kidney doc appointment this month and will discuss potential dialysis.  He will think of the idea about letting me fill pill box for him.   KMarylouise Stacks [place]11/11/2019

## 2020-02-09 NOTE — Patient Instructions (Signed)
INCREASE Metolazone to 5mg  (1 tab) on Tuesday, Thursday and Satruday  You were given IV lasix today to replace your morning dose of Torsemide.  You will receive 80mg  IV lasix on Wednesday and Friday of this week to replace your morning Torsemide to be given by [employer].  After this week, you will get 80mg  IV lasix every Monday and Friday  Labs today We will only contact you if something comes back abnormal or we need to make some changes. Otherwise no news is good news!    Repeat labs in 10 days  Your physician recommends that you schedule a follow-up appointment in: 2 weeks with Nurse Practitioner or Physician Assistant  Please call office at [phone] option 2 if you have any questions or concerns.   At the [facility], you and your health needs are our priority. As part of our continuing mission to provide you with exceptional heart care, we have created designated Provider Care Teams. These Care Teams include your primary Cardiologist (physician) and Advanced Practice Providers (APPs- Physician Assistants and Nurse Practitioners) who all work together to provide you with the care you need, when you need it.   You may see any of the following providers on your designated Care Team at your next follow up:  Dr Glori Bickers  Dr Haynes Kerns, NP  Lyda Jester, [place]  Audry Riles, PharmD   Please be sure to bring in all your medications bottles to every appointment.

## 2020-02-09 NOTE — Progress Notes (Signed)
Lasix 80mg  IV given as ordered via Left arm IV site. 225 cc yellow urine emptied from urinary catheter.

## 2020-02-09 NOTE — Progress Notes (Signed)
Advanced Heart Failure Clinic Note   PCP: [name], [name], MD PCP-Cardiologist: [name], MD  HF: [name]  HPI: [name] is a 84 y.o. male with h/o mechanical AVR 1994, OSA on CPAP, Chronic afib, chronic combined CHF, CKD III, chronic coumadin therapy, CAD s/p CAGB 1994, and COPD.   Admitted 7/18-11/05/17 from [facility] office with A/C diastolic HF. Diuresed 83 lbs with lasix drip, metolazone, and diamox. Transitioned to torsemide 80 mg BID. Determined not to be a candidate for MitraClip. Dr [name] also consulted and recommended aggressive medical therapy versus  percutaneous MVR trial as he felt very high risk for conventional surgery. Course complicated by drop in hemoglobin and symptomatic hypotension, requiring 1 unit pRBCs. GI consulted and did not recommend scoping. BB and spiro were stopped due to low BPs. Had some AKI assoicated with low BP, but resolved by discharge. Required lovenox bridge at DC with subtherapeutic INR. Discharged with [facility]. DC weight: 216 lbs.   Admitted 9/11 - 12/14/17 for dental extractions in setting of work up for MVR. Hospital course complicated by run of VT in the setting of hypokalemia. Started on amiodarone with h/o of arrhythmia. Bridged with lovenox.   Admitted 9/22 - 12/28/17 with symptomatic anemia from bleeding gums. Bleeding stopped with holding heparin and coumadin. Received multiple units of blood.   He was admitted again in 10/19 with left thigh hematoma and went from there to inpatient rehab.   He has been evaluated in [place] for a transcatheter mitral valve.  RHC/LHC was done in 1/20 as part of his pre-op evaluation.  Filling pressures and cardiac output were well-compensated, he had patent SVG-PDA.  Echo in 1/20 showed EF 40-45%, moderate RV dilation with normal systolic function, severe MR with likely rheumatic mitral valve, normal St Jude mechanical aortic valve. Unfortunately, it was determined that he would not qualify for any of  the percutaneous mitral valve studies.    His diuretic doses have been up and down based on volume retention and creatinine.   Echo in 4/21 showed EF 50-55% with moderate LV dilation, severe MR, mildly decreased RV systolic function, dilated IVC, stable mechanical aortic valve.   He returns for followup of diastolic CHF, mechanical aortic valve, chronic atrial fibrillation, and CAD.  Recent mechanical fall with right leg wound that is draining and not healing well, he has a wound clinic appt. He has had urinary retention and now has an indwelling foley (follows with urology).  Weight is up 10 lbs on our scale but has heavy clothes on.  Has been fairly stable at home.  He got IV Lasix twice last week.  Still struggling, he is short of breath walking around the house with his walker.  Using albuterol more often.  No lightheadedness.  +PND, +orthopnea.  Wants to know if he can get home oxygen.     Labs (10/19): hgb 9.2 Labs (11/19): K 3.2, creatinine 1.31 Labs (1/20): K 4.5, creatinine 1.38 Labs (2/20): LDL 64 Labs (6/20): K 4.1, creatinine 1.8, hgb 12.9 Labs (1/21): LDL 64 Labs (2/21): K 4, creatinine 2.9 => 2.47 Labs (3/21): K 4.9, creatinine 3 => 2.92, LFTs normal, elevated TSH with normal free T3 and T4 Labs (5/21): hgb 10, plts 53 Labs (6/21): K 3.7, creatinine 2.16 Labs (10/21): K 4.3, creatinine 3.19 => 2.79 Labs (11/21): LFTs normal, K 4, creatinine 2.77  ROS:  All systems reviewed and negative except as per HPI.    PMH: 1. Carotid stenosis: S/p  left CEA in 1994.  2. Atrial fibrillation: Chronic.  He is on warfarin with mechanical aortic valve.  3. Type II diabetes.  4. COPD: PFTs (11/19) with moderate obstructive airways disease and severely decreased DLCO.  5. OSA: Bipap recommended 6. 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  7. ITP 8. VT: On amiodarone.  9. 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  10. Rheumatic mitral valve disease: TEE (7/19) showed severe MR with minimal mitral  stenosis, suspect rheumatic mitral valve disease.  11. Chronic systolic CHF: TEE (5/63) with EF 55-60%, D-shaped septum, mildly dilated RV with mildly decreased systolic function, mechanical aortic valve, rheumatic-appearing mitral valve with severe MR, minimal stenosis.  - RHC (1/20): mean RA 6, PA 54/20, mean PCWP 15, CI 3.42, PVR 2.1 WU, PAPI 5.2 - Echo (1/20): EF 40-45%, moderate RV dilation with normal systolic function, severe biatrial enlargement, severe rheumatic MR, St Jude mechanical aortic valve functioning normally.  - Echo (3/21): EF 50-55% with moderate LV dilation, severe MR, mildly decreased RV systolic function, dilated IVC, stable mechanical aortic valve.  12. ABIs (12/18): Normal.  13. CAD: S/p CABG 1994.  - LHC (1/20): 50% D1, 60% ostial LCx, totally occluded RCA with patent SVG-PDA.     Current Outpatient Medications  Medication Sig Dispense Refill  . acetaminophen (TYLENOL) 500 MG tablet Take 1,000 mg by mouth every 6 (six) hours as needed for moderate pain or headache.    . albuterol (PROAIR HFA) 108 (90 Base) MCG/ACT inhaler Inhale 2 puffs into the lungs every 4 (four) hours as needed for wheezing or shortness of breath. 18 g 5  . allopurinol (ZYLOPRIM) 100 MG tablet TAKE 1 TABLET BY MOUTH EVERY DAY 90 tablet 3  . amiodarone (PACERONE) 200 MG tablet Take 1 tablet (200 mg total) by mouth daily. 90 tablet 3  . carvedilol (COREG) 3.125 MG tablet Take 1 tablet (3.125 mg total) by mouth 2 (two) times daily. 180 tablet 3  . ezetimibe-simvastatin (VYTORIN) 10-20 MG tablet TAKE 1 TABLET BY MOUTH EVERYDAY AT BEDTIME 90 tablet 3  . finasteride (PROSCAR) 5 MG tablet Take 5 mg by mouth daily.    Marland Kitchen levothyroxine (SYNTHROID) 50 MCG tablet Take 1.5 tablets (75 mcg total) by mouth daily before breakfast. 90 tablet 3  . metolazone (ZAROXOLYN) 5 MG tablet Take 1 tablet (5 mg total) by mouth 3 (three) times a week. Tuesdaym, Thursday and Saturday 15 tablet 5  . polyethylene glycol powder  (GLYCOLAX/MIRALAX) powder Take 17 g by mouth daily as needed for moderate constipation.    . potassium chloride SA (KLOR-CON M20) 20 MEQ tablet Take 3 tablets (60 mEq total) by mouth in the morning AND 2 tablets (40 mEq total) daily after supper. 150 tablet 3  . tamsulosin (FLOMAX) 0.4 MG CAPS capsule Take 0.4 mg by mouth daily after supper.     . torsemide (DEMADEX) 100 MG tablet Take 1 tablet (100 mg total) by mouth 2 (two) times daily. 60 tablet 3  . triamcinolone ointment (KENALOG) 0.1 % Apply 1 application topically 2 (two) times daily. (Patient taking differently: Apply 1 application topically daily as needed. ) 80 g 1  . warfarin (COUMADIN) 5 MG tablet TAKE  AS DIRECTED BY COUMADIN CLINIC 100 tablet 1   No current facility-administered medications for this encounter.   Allergies  Allergen Reactions  . Rifampin Rash and Other (See Comments)    May have been caused by Vancomycin or Rifampin (??)  . Vancomycin Rash and Other (See Comments)  May have been caused by Vancomycin or Rifampin (??)   Social History   Socioeconomic History  . Marital status: Married    Spouse name: Not on file  . Number of children: 3  . Years of education: Not on file  . Highest education level: Not on file  Occupational History  . Not on file  Tobacco Use  . Smoking status: Former Smoker    Packs/day: 1.00    Years: 30.00    Pack years: 30.00    Types: Cigarettes    Quit date: 04/03/1992    Years since quitting: 27.8  . Smokeless tobacco: Never Used  Vaping Use  . Vaping Use: Never used  Substance and Sexual Activity  . Alcohol use: Yes    Comment: 4-6 ounces of wine daily  . Drug use: No    Comment: Half a cup a day.  Marland Kitchen Sexual activity: Not on file  Other Topics Concern  . Not on file  Social History Narrative   Patient is married and lives with his wife.   Youngest daughter lives in [place] also.   Patient has 2 other children.   Social Determinants of Health   Financial  Resource Strain:   . Difficulty of Paying Living Expenses: Not on file  Food Insecurity:   . Worried About Charity fundraiser in the Last Year: Not on file  . Ran Out of Food in the Last Year: Not on file  Transportation Needs:   . Lack of Transportation (Medical): Not on file  . Lack of Transportation (Non-Medical): Not on file  Physical Activity:   . Days of Exercise per Week: Not on file  . Minutes of Exercise per Session: Not on file  Stress:   . Feeling of Stress : Not on file  Social Connections:   . Frequency of Communication with Friends and Family: Not on file  . Frequency of Social Gatherings with Friends and Family: Not on file  . Attends Religious Services: Not on file  . Active Member of Clubs or Organizations: Not on file  . Attends Archivist Meetings: Not on file  . Marital Status: Not on file  Intimate Partner Violence:   . Fear of Current or Ex-Partner: Not on file  . Emotionally Abused: Not on file  . Physically Abused: Not on file  . Sexually Abused: Not on file    Family History  Problem Relation Age of Onset  . Cancer Father        lung   . Hypertension Mother    Vitals:   02/09/20 0856  BP: 122/72  Pulse: 66  SpO2: 94%  Weight: 116.8 kg (257 lb 6.4 oz)     Wt Readings from Last 3 Encounters:  02/09/20 116.8 kg (257 lb 6.4 oz)  02/04/20 102.5 kg (226 lb)  02/02/20 112 kg (247 lb)   PHYSICAL EXAM: General: NAD Neck: JVP 14 cm, no thyromegaly or thyroid nodule.  Lungs: Crackles at bases.  CV: Nondisplaced PMI.  Heart irregular S1/S2, mechanical S2, no S3/S4, 3/6 HSM apex.  2+ edema to thighs.  No carotid bruit.  Difficult to palpate pedal pulses.  Abdomen: Soft, nontender, no hepatosplenomegaly, no distention.  Skin: Intact without lesions or rashes.  Neurologic: Alert and oriented x 3.  Psych: Normal affect. Extremities: Lower leg ulceration, dressed.  HEENT: Normal.   ASSESSMENT & PLAN: 1. 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  Severe MR likely plays a significant role in CHF and RV failure. NYHA class IV.  He remains markedly volume overloaded and symptomatic. - I will give him Lasix 80 mg IV x 1 this morning in clinic to replace am torsemide. He will get Lasix 80 mg IV from paramedicine on Wednesday and Friday this week, then every Monday and Friday going forward starting next week.  - Continue torsemide 100 mg bid.   - Increase metolazone to 5 mg tiw on Tues/Thurs/Sat.  - Continue 60 qam/40 qpm.  - He is off spironolactone with elevated creatinine.  - Continue Coreg 3.125 mg bid. - We are getting to the point where we are unable to effectively diurese him.  He has an appt this month with nephrology.  Hemodialysis will likely end up being our only option to get fluid off him.  He may not tolerate well long-term with his heart disease, though his RV was only mildly dysfunctional on last echo.  2. Mechanical aortic valve: Appeared to function well on last echo.  - Continue warfarin with INR goal 2.5-3.5 (also with atrial fibrillation).  - He is off aspirin with spontaneous thigh hematoma.   3. Atrial fibrillation: Chronic, rate is controlled.  4. CAD: s/p CABG.  No chest pain.  Cath in 1/20 with patent SVG-PDA, no interventional target.  - No ASA given stable CAD with warfarin use.   - Continue Vytorin, good lipids in 1/21.  5. COPD: He is no longer using oxygen during the day.  Moderate COPD on 11/19 PFTs.  Oxygen saturation ok at rest today.  - Will have paramedicine walk him at home when he has his walker, will get home oxygen for him if oxygen saturation drops.  6. OSA: Says he cannot tolerate bipap.  7.CKD Stage 3b: BMET today and again in 10 days. Will need to follow closely.  8. Thrombocytopenia: Chronic, has been mild/stable.   9. Severe MR: TEE 7/19 with severe  MR with possible rheumatic MV (do not think MS is significant, mildly elevated mean gradient from high flow with severe MR).  Structural Heart team evaluated, not a Mitraclip candidate. Seen by Dr. [name], would be high risk for open surgery.  He was evaluated at [facility] in [place] for percutaneous mitral valve trials.  Unfortunately, he does not qualify for any of the available trials.  We will have to continue medical management.  10. VT: He is on amiodarone to suppress VT.  Recent LFTs normal.  Hypothyroidism followed by PCP.  Will need regular eye exam on amiodarone.  11. Anemia: Melena noted in past when INR was 10, now resolved.  12. RLE wound: He has an appointment in wound care clinic.  Poor healing in setting of edematous leg.   Followup with NP/PA in 2 weeks.   Loralie Champagne 02/09/2020

## 2020-02-10 ENCOUNTER — Ambulatory Visit (INDEPENDENT_AMBULATORY_CARE_PROVIDER_SITE_OTHER): Payer: Medicare Other | Admitting: *Deleted

## 2020-02-10 DIAGNOSIS — I4891 Unspecified atrial fibrillation: Secondary | ICD-10-CM | POA: Diagnosis not present

## 2020-02-10 DIAGNOSIS — Z5181 Encounter for therapeutic drug level monitoring: Secondary | ICD-10-CM

## 2020-02-10 DIAGNOSIS — Z952 Presence of prosthetic heart valve: Secondary | ICD-10-CM | POA: Diagnosis not present

## 2020-02-10 LAB — POCT INR: INR: 2.9 (ref 2.0–3.0)

## 2020-02-10 NOTE — Patient Instructions (Signed)
Description   Continue taking 1/2 tablet daily except for 1 tablet on Tuesday and Saturday. Be consistent with your 3-4 servings of greens per week. Recheck INR in 2 weeks. Coumadin Clinic [phone] Fax [phone]

## 2020-02-11 ENCOUNTER — Other Ambulatory Visit (HOSPITAL_COMMUNITY): Payer: Self-pay

## 2020-02-11 DIAGNOSIS — N312 Flaccid neuropathic bladder, not elsewhere classified: Secondary | ICD-10-CM | POA: Diagnosis not present

## 2020-02-11 DIAGNOSIS — R3914 Feeling of incomplete bladder emptying: Secondary | ICD-10-CM | POA: Diagnosis not present

## 2020-02-11 NOTE — Progress Notes (Signed)
Paramedicine Encounter    Patient ID: [name], male    DOB: 07-31-1935, 84 y.o.   MRN: [id number]   Patient Care Team: Cassandria Anger, MD as PCP - General (Internal Medicine) Belva Crome, MD as PCP - Cardiology (Cardiology) Sueanne Margarita, MD as PCP - Sleep Medicine (Sleep Medicine) Brunetta Genera, MD as Consulting Physician (Hematology) Belva Crome, MD as Consulting Physician (Cardiology) Elayne Snare, MD as Consulting Physician (Endocrinology) Lavonna Monarch, MD as Consulting Physician (Dermatology)  Patient Active Problem List   Diagnosis Date Noted  . Skin cancer 06/23/2019  . Bruising 03/25/2019  . Hypothyroidism 09/24/2018  . Acute on chronic renal failure ([place]) 01/31/2018  . Debility 01/18/2018  . Anemia 01/09/2018  . Foot pain, left 01/07/2018  . Dysuria 01/07/2018  . Severe anemia 12/23/2017  . Hyponatremia 12/23/2017  . Coronary artery disease with history of myocardial infarction without history of CABG 12/23/2017  . Bleeding from mouth 12/20/2017  . Post-op pain 12/12/2017  . Dental caries 12/12/2017  . Severe mitral regurgitation 11/14/2017  . Pressure injury of skin 10/29/2017  . Shortness of breath   . Palliative care by specialist   . RVF (right ventricular failure) ([place])   . Right heart failure ([place]) 10/18/2017  . Hypoxia   . COPD exacerbation ([place]) 08/01/2017  . Pancytopenia ([place]) 08/01/2017  . Anticoagulation goal of INR 1.5 to 2.5 07/16/2017  . Epistaxis 07/11/2017  . Actinic keratoses 04/05/2017  . Leg wound, right 02/18/2016  . Peripheral edema 02/18/2016  . Chronic anticoagulation 02/18/2016  . Hyperkalemia 07/25/2015  . Atherosclerosis of native arteries of extremity with intermittent claudication ([place]) 10/21/2014  . Thrombocytopenia ([place]) 10/20/2014  . Macrocytosis without anemia 10/20/2014  . OSA (obstructive sleep apnea) 05/15/2013  . Goals of care, counseling/discussion 05/01/2013  . History of mechanical aortic  valve replacement 01/02/2013  . Obesity (BMI 30-39.9) 10/20/2012  . Type II diabetes mellitus with complication ([place]) 78/29/5621  . Acute asthmatic bronchitis 01/18/2012  . ACUTE KIDNEY FAILURE UNSPECIFIED 02/04/2009  . CUTANEOUS ERUPTIONS, DRUG-INDUCED 02/04/2009  . HLD (hyperlipidemia) 02/03/2009  . Essential hypertension 02/03/2009  . Coronary atherosclerosis 02/03/2009  . Atrial fibrillation ([place]) 02/03/2009  . Chronic diastolic heart failure ([place]) 02/03/2009  . CAROTID ENDARTERECTOMY, LEFT, HX OF 02/03/2009  . INF&INFLAM REACT DUE CARD DEVICE IMPLANT&GRAFT 12/30/2008    Current Outpatient Medications:  .  acetaminophen (TYLENOL) 500 MG tablet, Take 1,000 mg by mouth every 6 (six) hours as needed for moderate pain or headache., Disp: , Rfl:  .  albuterol (PROAIR HFA) 108 (90 Base) MCG/ACT inhaler, Inhale 2 puffs into the lungs every 4 (four) hours as needed for wheezing or shortness of breath., Disp: 18 g, Rfl: 5 .  allopurinol (ZYLOPRIM) 100 MG tablet, TAKE 1 TABLET BY MOUTH EVERY DAY, Disp: 90 tablet, Rfl: 3 .  amiodarone (PACERONE) 200 MG tablet, Take 1 tablet (200 mg total) by mouth daily., Disp: 90 tablet, Rfl: 3 .  carvedilol (COREG) 3.125 MG tablet, Take 1 tablet (3.125 mg total) by mouth 2 (two) times daily., Disp: 180 tablet, Rfl: 3 .  ezetimibe-simvastatin (VYTORIN) 10-20 MG tablet, TAKE 1 TABLET BY MOUTH EVERYDAY AT BEDTIME, Disp: 90 tablet, Rfl: 3 .  finasteride (PROSCAR) 5 MG tablet, Take 5 mg by mouth daily., Disp: , Rfl:  .  levothyroxine (SYNTHROID) 50 MCG tablet, Take 1.5 tablets (75 mcg total) by mouth daily before breakfast., Disp: 90 tablet, Rfl: 3 .  metolazone (ZAROXOLYN) 5 MG tablet, Take 1  tablet (5 mg total) by mouth 3 (three) times a week. Tuesdaym, Thursday and Saturday, Disp: 15 tablet, Rfl: 5 .  polyethylene glycol powder (GLYCOLAX/MIRALAX) powder, Take 17 g by mouth daily as needed for moderate constipation., Disp: , Rfl:  .  potassium chloride SA (KLOR-CON  M20) 20 MEQ tablet, Take 3 tablets (60 mEq total) by mouth in the morning AND 2 tablets (40 mEq total) daily after supper., Disp: 150 tablet, Rfl: 3 .  tamsulosin (FLOMAX) 0.4 MG CAPS capsule, Take 0.4 mg by mouth daily after supper. , Disp: , Rfl:  .  torsemide (DEMADEX) 100 MG tablet, Take 1 tablet (100 mg total) by mouth 2 (two) times daily., Disp: 60 tablet, Rfl: 3 .  triamcinolone ointment (KENALOG) 0.1 %, Apply 1 application topically 2 (two) times daily. (Patient taking differently: Apply 1 application topically daily as needed. ), Disp: 80 g, Rfl: 1 .  warfarin (COUMADIN) 5 MG tablet, TAKE  AS DIRECTED BY COUMADIN CLINIC, Disp: 100 tablet, Rfl: 1 Allergies  Allergen Reactions  . Rifampin Rash and Other (See Comments)    May have been caused by Vancomycin or Rifampin (??)  . Vancomycin Rash and Other (See Comments)    May have been caused by Vancomycin or Rifampin (??)      Social History   Socioeconomic History  . Marital status: Married    Spouse name: Not on file  . Number of children: 3  . Years of education: Not on file  . Highest education level: Not on file  Occupational History  . Not on file  Tobacco Use  . Smoking status: Former Smoker    Packs/day: 1.00    Years: 30.00    Pack years: 30.00    Types: Cigarettes    Quit date: 04/03/1992    Years since quitting: 27.8  . Smokeless tobacco: Never Used  Vaping Use  . Vaping Use: Never used  Substance and Sexual Activity  . Alcohol use: Yes    Comment: 4-6 ounces of wine daily  . Drug use: No    Comment: Half a cup a day.  Marland Kitchen Sexual activity: Not on file  Other Topics Concern  . Not on file  Social History Narrative   Patient is married and lives with his wife.   Youngest daughter lives in [place] also.   Patient has 2 other children.   Social Determinants of Health   Financial Resource Strain:   . Difficulty of Paying Living Expenses: Not on file  Food Insecurity:   . Worried About Charity fundraiser  in the Last Year: Not on file  . Ran Out of Food in the Last Year: Not on file  Transportation Needs:   . Lack of Transportation (Medical): Not on file  . Lack of Transportation (Non-Medical): Not on file  Physical Activity:   . Days of Exercise per Week: Not on file  . Minutes of Exercise per Session: Not on file  Stress:   . Feeling of Stress : Not on file  Social Connections:   . Frequency of Communication with Friends and Family: Not on file  . Frequency of Social Gatherings with Friends and Family: Not on file  . Attends Religious Services: Not on file  . Active Member of Clubs or Organizations: Not on file  . Attends Archivist Meetings: Not on file  . Marital Status: Not on file  Intimate Partner Violence:   . Fear of Current or Ex-Partner: Not on file  .  Emotionally Abused: Not on file  . Physically Abused: Not on file  . Sexually Abused: Not on file    Physical Exam Pulmonary:     Effort: Pulmonary effort is normal.     Breath sounds: Normal breath sounds.  Neurological:     Mental Status: He is alert and oriented to person, place, and time.         Future Appointments  Date Time Provider [place]  02/24/2020 12:45 PM [facility]  02/24/2020  2:00 PM [facility] PA/NP [facility] None  03/03/2020  2:45 PM Stone, Margarita Grizzle Eday III, PA-C [facility]  05/18/2020  8:45 AM [facility] None  05/18/2020  9:20 AM Brunetta Genera, MD [facility] None    BP 106/64   Pulse 72   Resp 18   Wt 252 lb (114.3 kg)   SpO2 92% Comment: dropped to 87% RA  BMI 32.35 kg/m   Weight yesterday-? Last visit weight-257 @ clinic   Here today for IV lasix 80 mg today.  Pt states he feels like he is doing better. His swelling is coming down.  The clinic had started IV on Monday but when I arrived here the IV had not been clamped off and there was clotted blood in the hub and some in the tube so was not able  to use it.  Walk test his 02 sat dropped to 87%. This was relayed to clinic.  I will be coming back on Friday for another 80MG  to give.  He states he had a lot of urine output last night after the 5mg  dose of metolazone.  Pt denies c/p, no dizziness.  He began to urinate approx 41min post lasix admin.  Will see him again on Friday for another dose.   Marylouise Stacks, [facility] Paramedic  02/11/20

## 2020-02-12 ENCOUNTER — Other Ambulatory Visit (HOSPITAL_COMMUNITY): Payer: Self-pay | Admitting: Cardiology

## 2020-02-13 ENCOUNTER — Other Ambulatory Visit (HOSPITAL_COMMUNITY): Payer: Self-pay

## 2020-02-13 NOTE — Progress Notes (Signed)
Paramedicine Encounter    Patient ID: [name], male    DOB: 1935-06-04, 84 y.o.   MRN: [id number]   Patient Care Team: Cassandria Anger, MD as PCP - General (Internal Medicine) Belva Crome, MD as PCP - Cardiology (Cardiology) Sueanne Margarita, MD as PCP - Sleep Medicine (Sleep Medicine) Brunetta Genera, MD as Consulting Physician (Hematology) Belva Crome, MD as Consulting Physician (Cardiology) Elayne Snare, MD as Consulting Physician (Endocrinology) Lavonna Monarch, MD as Consulting Physician (Dermatology)  Patient Active Problem List   Diagnosis Date Noted  . Skin cancer 06/23/2019  . Bruising 03/25/2019  . Hypothyroidism 09/24/2018  . Acute on chronic renal failure ([place]) 01/31/2018  . Debility 01/18/2018  . Anemia 01/09/2018  . Foot pain, left 01/07/2018  . Dysuria 01/07/2018  . Severe anemia 12/23/2017  . Hyponatremia 12/23/2017  . Coronary artery disease with history of myocardial infarction without history of CABG 12/23/2017  . Bleeding from mouth 12/20/2017  . Post-op pain 12/12/2017  . Dental caries 12/12/2017  . Severe mitral regurgitation 11/14/2017  . Pressure injury of skin 10/29/2017  . Shortness of breath   . Palliative care by specialist   . RVF (right ventricular failure) ([place])   . Right heart failure ([place]) 10/18/2017  . Hypoxia   . COPD exacerbation ([place]) 08/01/2017  . Pancytopenia ([place]) 08/01/2017  . Anticoagulation goal of INR 1.5 to 2.5 07/16/2017  . Epistaxis 07/11/2017  . Actinic keratoses 04/05/2017  . Leg wound, right 02/18/2016  . Peripheral edema 02/18/2016  . Chronic anticoagulation 02/18/2016  . Hyperkalemia 07/25/2015  . Atherosclerosis of native arteries of extremity with intermittent claudication ([place]) 10/21/2014  . Thrombocytopenia ([place]) 10/20/2014  . Macrocytosis without anemia 10/20/2014  . OSA (obstructive sleep apnea) 05/15/2013  . Goals of care, counseling/discussion 05/01/2013  . History of mechanical aortic  valve replacement 01/02/2013  . Obesity (BMI 30-39.9) 10/20/2012  . Type II diabetes mellitus with complication ([place]) 00/93/8182  . Acute asthmatic bronchitis 01/18/2012  . ACUTE KIDNEY FAILURE UNSPECIFIED 02/04/2009  . CUTANEOUS ERUPTIONS, DRUG-INDUCED 02/04/2009  . HLD (hyperlipidemia) 02/03/2009  . Essential hypertension 02/03/2009  . Coronary atherosclerosis 02/03/2009  . Atrial fibrillation ([place]) 02/03/2009  . Chronic diastolic heart failure ([place]) 02/03/2009  . CAROTID ENDARTERECTOMY, LEFT, HX OF 02/03/2009  . INF&INFLAM REACT DUE CARD DEVICE IMPLANT&GRAFT 12/30/2008    Current Outpatient Medications:  .  acetaminophen (TYLENOL) 500 MG tablet, Take 1,000 mg by mouth every 6 (six) hours as needed for moderate pain or headache., Disp: , Rfl:  .  albuterol (PROAIR HFA) 108 (90 Base) MCG/ACT inhaler, Inhale 2 puffs into the lungs every 4 (four) hours as needed for wheezing or shortness of breath., Disp: 18 g, Rfl: 5 .  allopurinol (ZYLOPRIM) 100 MG tablet, TAKE 1 TABLET BY MOUTH EVERY DAY, Disp: 90 tablet, Rfl: 3 .  amiodarone (PACERONE) 200 MG tablet, Take 1 tablet (200 mg total) by mouth daily., Disp: 90 tablet, Rfl: 3 .  carvedilol (COREG) 3.125 MG tablet, Take 1 tablet (3.125 mg total) by mouth 2 (two) times daily., Disp: 180 tablet, Rfl: 3 .  ezetimibe-simvastatin (VYTORIN) 10-20 MG tablet, TAKE 1 TABLET BY MOUTH EVERYDAY AT BEDTIME, Disp: 90 tablet, Rfl: 3 .  finasteride (PROSCAR) 5 MG tablet, Take 5 mg by mouth daily., Disp: , Rfl:  .  KLOR-CON M20 20 MEQ tablet, TAKE 2 TABLETS (40 MEQ TOTAL) BY MOUTH 2 (TWO) TIMES DAILY., Disp: 120 tablet, Rfl: 1 .  levothyroxine (SYNTHROID) 50 MCG tablet, Take  1.5 tablets (75 mcg total) by mouth daily before breakfast., Disp: 90 tablet, Rfl: 3 .  metolazone (ZAROXOLYN) 5 MG tablet, Take 1 tablet (5 mg total) by mouth 3 (three) times a week. Tuesdaym, Thursday and Saturday, Disp: 15 tablet, Rfl: 5 .  polyethylene glycol powder (GLYCOLAX/MIRALAX)  powder, Take 17 g by mouth daily as needed for moderate constipation., Disp: , Rfl:  .  tamsulosin (FLOMAX) 0.4 MG CAPS capsule, Take 0.4 mg by mouth daily after supper. , Disp: , Rfl:  .  torsemide (DEMADEX) 100 MG tablet, Take 1 tablet (100 mg total) by mouth 2 (two) times daily., Disp: 60 tablet, Rfl: 3 .  triamcinolone ointment (KENALOG) 0.1 %, Apply 1 application topically 2 (two) times daily. (Patient taking differently: Apply 1 application topically daily as needed. ), Disp: 80 g, Rfl: 1 .  warfarin (COUMADIN) 5 MG tablet, TAKE  AS DIRECTED BY COUMADIN CLINIC, Disp: 100 tablet, Rfl: 1 Allergies  Allergen Reactions  . Rifampin Rash and Other (See Comments)    May have been caused by Vancomycin or Rifampin (??)  . Vancomycin Rash and Other (See Comments)    May have been caused by Vancomycin or Rifampin (??)      Social History   Socioeconomic History  . Marital status: Married    Spouse name: Not on file  . Number of children: 3  . Years of education: Not on file  . Highest education level: Not on file  Occupational History  . Not on file  Tobacco Use  . Smoking status: Former Smoker    Packs/day: 1.00    Years: 30.00    Pack years: 30.00    Types: Cigarettes    Quit date: 04/03/1992    Years since quitting: 27.8  . Smokeless tobacco: Never Used  Vaping Use  . Vaping Use: Never used  Substance and Sexual Activity  . Alcohol use: Yes    Comment: 4-6 ounces of wine daily  . Drug use: No    Comment: Half a cup a day.  Marland Kitchen Sexual activity: Not on file  Other Topics Concern  . Not on file  Social History Narrative   Patient is married and lives with his wife.   Youngest daughter lives in [place] also.   Patient has 2 other children.   Social Determinants of Health   Financial Resource Strain:   . Difficulty of Paying Living Expenses: Not on file  Food Insecurity:   . Worried About Charity fundraiser in the Last Year: Not on file  . Ran Out of Food in the Last  Year: Not on file  Transportation Needs:   . Lack of Transportation (Medical): Not on file  . Lack of Transportation (Non-Medical): Not on file  Physical Activity:   . Days of Exercise per Week: Not on file  . Minutes of Exercise per Session: Not on file  Stress:   . Feeling of Stress : Not on file  Social Connections:   . Frequency of Communication with Friends and Family: Not on file  . Frequency of Social Gatherings with Friends and Family: Not on file  . Attends Religious Services: Not on file  . Active Member of Clubs or Organizations: Not on file  . Attends Archivist Meetings: Not on file  . Marital Status: Not on file  Intimate Partner Violence:   . Fear of Current or Ex-Partner: Not on file  . Emotionally Abused: Not on file  . Physically Abused:  Not on file  . Sexually Abused: Not on file    Physical Exam      Future Appointments  Date Time Provider [place]  02/24/2020 12:45 PM [facility]  02/24/2020  2:00 PM [facility] PA/NP [facility] None  03/03/2020  2:45 PM Stone, Margarita Grizzle Eday III, PA-C [facility]  05/18/2020  8:45 AM [facility] None  05/18/2020  9:20 AM Brunetta Genera, MD [facility] None    BP (!) 104/58   Pulse 64   Resp (!) 22   SpO2 93%   Weight yesterday-251 Last visit weight-252  Came out today for IV lasix again. He had a fall yesterday when he tripped going to bathroom and fell up against the wall. He hit the left side of the jaw area, he denies any pain but it does look swollen and it is bruised. He has a skin tear to his left shoulder, his wife placed bandages on it. His left arm is about black and blue. He had to get fire dept to come get him up and he did not want to go to hosp for further eval. He denied LOC. No c/p. Breathing is doing same. He feels like the IV lasix is helping, but with how poor his veins and skin are I am not sure how much longer he is going to  be able to tolerate it.  IV placed in L forearm at 1005. Began urinated at 1035.  He did urinate right before I got here.  Will f/u on Monday for another dose of IV lasix.   Marylouise Stacks, [facility] Paramedic  02/13/20

## 2020-02-16 ENCOUNTER — Other Ambulatory Visit (HOSPITAL_COMMUNITY): Payer: Self-pay

## 2020-02-16 NOTE — Progress Notes (Signed)
Paramedicine Encounter    Patient ID: [name], male    DOB: 1935/10/16, 84 y.o.   MRN: [id number]   Patient Care Team: Cassandria Anger, MD as PCP - General (Internal Medicine) Belva Crome, MD as PCP - Cardiology (Cardiology) Sueanne Margarita, MD as PCP - Sleep Medicine (Sleep Medicine) Brunetta Genera, MD as Consulting Physician (Hematology) Belva Crome, MD as Consulting Physician (Cardiology) Elayne Snare, MD as Consulting Physician (Endocrinology) Lavonna Monarch, MD as Consulting Physician (Dermatology)  Patient Active Problem List   Diagnosis Date Noted  . Skin cancer 06/23/2019  . Bruising 03/25/2019  . Hypothyroidism 09/24/2018  . Acute on chronic renal failure ([place]) 01/31/2018  . Debility 01/18/2018  . Anemia 01/09/2018  . Foot pain, left 01/07/2018  . Dysuria 01/07/2018  . Severe anemia 12/23/2017  . Hyponatremia 12/23/2017  . Coronary artery disease with history of myocardial infarction without history of CABG 12/23/2017  . Bleeding from mouth 12/20/2017  . Post-op pain 12/12/2017  . Dental caries 12/12/2017  . Severe mitral regurgitation 11/14/2017  . Pressure injury of skin 10/29/2017  . Shortness of breath   . Palliative care by specialist   . RVF (right ventricular failure) ([place])   . Right heart failure ([place]) 10/18/2017  . Hypoxia   . COPD exacerbation ([place]) 08/01/2017  . Pancytopenia ([place]) 08/01/2017  . Anticoagulation goal of INR 1.5 to 2.5 07/16/2017  . Epistaxis 07/11/2017  . Actinic keratoses 04/05/2017  . Leg wound, right 02/18/2016  . Peripheral edema 02/18/2016  . Chronic anticoagulation 02/18/2016  . Hyperkalemia 07/25/2015  . Atherosclerosis of native arteries of extremity with intermittent claudication ([place]) 10/21/2014  . Thrombocytopenia ([place]) 10/20/2014  . Macrocytosis without anemia 10/20/2014  . OSA (obstructive sleep apnea) 05/15/2013  . Goals of care, counseling/discussion 05/01/2013  . History of mechanical aortic  valve replacement 01/02/2013  . Obesity (BMI 30-39.9) 10/20/2012  . Type II diabetes mellitus with complication ([place]) 19/14/7829  . Acute asthmatic bronchitis 01/18/2012  . ACUTE KIDNEY FAILURE UNSPECIFIED 02/04/2009  . CUTANEOUS ERUPTIONS, DRUG-INDUCED 02/04/2009  . HLD (hyperlipidemia) 02/03/2009  . Essential hypertension 02/03/2009  . Coronary atherosclerosis 02/03/2009  . Atrial fibrillation ([place]) 02/03/2009  . Chronic diastolic heart failure ([place]) 02/03/2009  . CAROTID ENDARTERECTOMY, LEFT, HX OF 02/03/2009  . INF&INFLAM REACT DUE CARD DEVICE IMPLANT&GRAFT 12/30/2008    Current Outpatient Medications:  .  acetaminophen (TYLENOL) 500 MG tablet, Take 1,000 mg by mouth every 6 (six) hours as needed for moderate pain or headache., Disp: , Rfl:  .  albuterol (PROAIR HFA) 108 (90 Base) MCG/ACT inhaler, Inhale 2 puffs into the lungs every 4 (four) hours as needed for wheezing or shortness of breath., Disp: 18 g, Rfl: 5 .  allopurinol (ZYLOPRIM) 100 MG tablet, TAKE 1 TABLET BY MOUTH EVERY DAY, Disp: 90 tablet, Rfl: 3 .  amiodarone (PACERONE) 200 MG tablet, Take 1 tablet (200 mg total) by mouth daily., Disp: 90 tablet, Rfl: 3 .  carvedilol (COREG) 3.125 MG tablet, Take 1 tablet (3.125 mg total) by mouth 2 (two) times daily., Disp: 180 tablet, Rfl: 3 .  ezetimibe-simvastatin (VYTORIN) 10-20 MG tablet, TAKE 1 TABLET BY MOUTH EVERYDAY AT BEDTIME, Disp: 90 tablet, Rfl: 3 .  finasteride (PROSCAR) 5 MG tablet, Take 5 mg by mouth daily., Disp: , Rfl:  .  KLOR-CON M20 20 MEQ tablet, TAKE 2 TABLETS (40 MEQ TOTAL) BY MOUTH 2 (TWO) TIMES DAILY., Disp: 120 tablet, Rfl: 1 .  levothyroxine (SYNTHROID) 50 MCG tablet, Take  1.5 tablets (75 mcg total) by mouth daily before breakfast., Disp: 90 tablet, Rfl: 3 .  metolazone (ZAROXOLYN) 5 MG tablet, Take 1 tablet (5 mg total) by mouth 3 (three) times a week. Tuesdaym, Thursday and Saturday, Disp: 15 tablet, Rfl: 5 .  polyethylene glycol powder (GLYCOLAX/MIRALAX)  powder, Take 17 g by mouth daily as needed for moderate constipation., Disp: , Rfl:  .  tamsulosin (FLOMAX) 0.4 MG CAPS capsule, Take 0.4 mg by mouth daily after supper. , Disp: , Rfl:  .  torsemide (DEMADEX) 100 MG tablet, Take 1 tablet (100 mg total) by mouth 2 (two) times daily., Disp: 60 tablet, Rfl: 3 .  triamcinolone ointment (KENALOG) 0.1 %, Apply 1 application topically 2 (two) times daily. (Patient taking differently: Apply 1 application topically daily as needed. ), Disp: 80 g, Rfl: 1 .  warfarin (COUMADIN) 5 MG tablet, TAKE  AS DIRECTED BY COUMADIN CLINIC, Disp: 100 tablet, Rfl: 1 Allergies  Allergen Reactions  . Rifampin Rash and Other (See Comments)    May have been caused by Vancomycin or Rifampin (??)  . Vancomycin Rash and Other (See Comments)    May have been caused by Vancomycin or Rifampin (??)      Social History   Socioeconomic History  . Marital status: Married    Spouse name: Not on file  . Number of children: 3  . Years of education: Not on file  . Highest education level: Not on file  Occupational History  . Not on file  Tobacco Use  . Smoking status: Former Smoker    Packs/day: 1.00    Years: 30.00    Pack years: 30.00    Types: Cigarettes    Quit date: 04/03/1992    Years since quitting: 27.8  . Smokeless tobacco: Never Used  Vaping Use  . Vaping Use: Never used  Substance and Sexual Activity  . Alcohol use: Yes    Comment: 4-6 ounces of wine daily  . Drug use: No    Comment: Half a cup a day.  Marland Kitchen Sexual activity: Not on file  Other Topics Concern  . Not on file  Social History Narrative   Patient is married and lives with his wife.   Youngest daughter lives in [place] also.   Patient has 2 other children.   Social Determinants of Health   Financial Resource Strain:   . Difficulty of Paying Living Expenses: Not on file  Food Insecurity:   . Worried About Charity fundraiser in the Last Year: Not on file  . Ran Out of Food in the Last  Year: Not on file  Transportation Needs:   . Lack of Transportation (Medical): Not on file  . Lack of Transportation (Non-Medical): Not on file  Physical Activity:   . Days of Exercise per Week: Not on file  . Minutes of Exercise per Session: Not on file  Stress:   . Feeling of Stress : Not on file  Social Connections:   . Frequency of Communication with Friends and Family: Not on file  . Frequency of Social Gatherings with Friends and Family: Not on file  . Attends Religious Services: Not on file  . Active Member of Clubs or Organizations: Not on file  . Attends Archivist Meetings: Not on file  . Marital Status: Not on file  Intimate Partner Violence:   . Fear of Current or Ex-Partner: Not on file  . Emotionally Abused: Not on file  . Physically Abused:  Not on file  . Sexually Abused: Not on file    Physical Exam      Future Appointments  Date Time Provider [place]  02/24/2020 12:45 PM [facility]  02/24/2020  2:00 PM [facility] PA/NP [facility] None  03/03/2020  2:45 PM Stone, Margarita Grizzle Eday III, PA-C [facility]  05/18/2020  8:45 AM [facility] None  05/18/2020  9:20 AM Brunetta Genera, MD [facility] None    BP 106/70   Pulse (!) 56   Resp (!) 22   Wt 254 lb (115.2 kg)   SpO2 93%   BMI 32.61 kg/m   Weight yesterday-? Last visit weight-251  Pt reports he is doing ok, no more falls.  Wife reports that skin tear on his shoulder kept bleeding and leaking and that has finally resolved.  She had to call palliative care for his legs as they have wounds on them and had been leaking and she reports they are doing better. He still has not been seen by wound care yet.  From what he told me last week his weight is up a couple lbs but he thinks he may have told me the wrong weight-that 251 could have been 257.  But he states he feels better. Still sob upon exertion.  Today I am here for IV lasix  again. 80mg  dose at 1030. IV 24g to left hand. He did not take his AM torsemide today.   IV removed.  He did have a slight wheeze to upper left and lower rt side.  He does not prefer to use a pill box. meds verified.  He began to urinate at 1055.  He goes to kidney doc next week.  He will get labs done next week at [facility] office.   Marylouise Stacks, [facility] Paramedic  02/16/20

## 2020-02-19 ENCOUNTER — Telehealth: Payer: Self-pay

## 2020-02-19 NOTE — Telephone Encounter (Signed)
1245 pm.  Telephonic visit completed with wife [name] as patient is unable to take the call at this time.  [name] confirms IV lasix treatment is being completed 2x weekly.  She feels this has been somewhat helpful as patient's weight is down to the 240's.   He continues to have shortness of breath with exertion.  Patient continues to use his rolling walker to ambulate in the home.  Wife states patient typically needs to use his rescue inhaler after he ambulates to the bathroom.  Patient's appetite continues to be good.  [name] reports patient is eating well and she is being mindful of the restrictions due to coumadin use.  Patient sustained a fall last week and had some abrasions to the left shoulder blade and bruising to the face.  Wife states patient was ambulating to the bathroom due to diarrhea when he sustained a fall.  No further issues with diarrhea are reported.  [name] states there was some discussion of hospice services with PCP.  We further discuss these services and requirements.  [name] states they have a follow up appointment with Dr. [name] next week.  She would like to see if there has been some improvement in patient's condition since the IV Lasix started about 2-3 weeks ago.  [name] states she will see if MD feels patient needs hospice now.  If patient does qualify, she would like to have hospice at home for as long as possible.  [name] has requested Palliative Care follow up in a couple of weeks.  She appreciates all the support and calls as she is doing most of the care giving herself.  Advised that I would follow up in 2 wks.

## 2020-02-23 ENCOUNTER — Encounter (HOSPITAL_COMMUNITY): Payer: Medicare Other

## 2020-02-23 ENCOUNTER — Other Ambulatory Visit (HOSPITAL_COMMUNITY): Payer: Self-pay

## 2020-02-23 DIAGNOSIS — I1310 Hypertensive heart and chronic kidney disease without heart failure, with stage 1 through stage 4 chronic kidney disease, or unspecified chronic kidney disease: Secondary | ICD-10-CM | POA: Diagnosis not present

## 2020-02-23 DIAGNOSIS — I5042 Chronic combined systolic (congestive) and diastolic (congestive) heart failure: Secondary | ICD-10-CM | POA: Diagnosis not present

## 2020-02-23 DIAGNOSIS — N184 Chronic kidney disease, stage 4 (severe): Secondary | ICD-10-CM | POA: Diagnosis not present

## 2020-02-23 DIAGNOSIS — Z7901 Long term (current) use of anticoagulants: Secondary | ICD-10-CM | POA: Diagnosis not present

## 2020-02-23 DIAGNOSIS — D631 Anemia in chronic kidney disease: Secondary | ICD-10-CM | POA: Diagnosis not present

## 2020-02-23 NOTE — Progress Notes (Signed)
Paramedicine Encounter    Patient ID: [name], male    DOB: 1935-08-24, 84 y.o.   MRN: [id number]   Patient Care Team: Cassandria Anger, MD as PCP - General (Internal Medicine) Belva Crome, MD as PCP - Cardiology (Cardiology) Sueanne Margarita, MD as PCP - Sleep Medicine (Sleep Medicine) Brunetta Genera, MD as Consulting Physician (Hematology) Belva Crome, MD as Consulting Physician (Cardiology) Elayne Snare, MD as Consulting Physician (Endocrinology) Lavonna Monarch, MD as Consulting Physician (Dermatology)  Patient Active Problem List   Diagnosis Date Noted   Skin cancer 06/23/2019   Bruising 03/25/2019   Hypothyroidism 09/24/2018   Acute on chronic renal failure ([place]) 01/31/2018   Debility 01/18/2018   Anemia 01/09/2018   Foot pain, left 01/07/2018   Dysuria 01/07/2018   Severe anemia 12/23/2017   Hyponatremia 12/23/2017   Coronary artery disease with history of myocardial infarction without history of CABG 12/23/2017   Bleeding from mouth 12/20/2017   Post-op pain 12/12/2017   Dental caries 12/12/2017   Severe mitral regurgitation 11/14/2017   Pressure injury of skin 10/29/2017   Shortness of breath    Palliative care by specialist    RVF (right ventricular failure) ([place])    Right heart failure ([place]) 10/18/2017   Hypoxia    COPD exacerbation ([place]) 08/01/2017   Pancytopenia ([place]) 08/01/2017   Anticoagulation goal of INR 1.5 to 2.5 07/16/2017   Epistaxis 07/11/2017   Actinic keratoses 04/05/2017   Leg wound, right 02/18/2016   Peripheral edema 02/18/2016   Chronic anticoagulation 02/18/2016   Hyperkalemia 07/25/2015   Atherosclerosis of native arteries of extremity with intermittent claudication ([place]) 10/21/2014   Thrombocytopenia ([place]) 10/20/2014   Macrocytosis without anemia 10/20/2014   OSA (obstructive sleep apnea) 05/15/2013   Goals of care, counseling/discussion 05/01/2013   History of mechanical aortic  valve replacement 01/02/2013   Obesity (BMI 30-39.9) 10/20/2012   Type II diabetes mellitus with complication (HCC) 23/76/2831   Acute asthmatic bronchitis 01/18/2012   ACUTE KIDNEY FAILURE UNSPECIFIED 02/04/2009   CUTANEOUS ERUPTIONS, DRUG-INDUCED 02/04/2009   HLD (hyperlipidemia) 02/03/2009   Essential hypertension 02/03/2009   Coronary atherosclerosis 02/03/2009   Atrial fibrillation ([place]) 02/03/2009   Chronic diastolic heart failure ([place]) 02/03/2009   CAROTID ENDARTERECTOMY, LEFT, HX OF 02/03/2009   INF&INFLAM REACT DUE CARD DEVICE IMPLANT&GRAFT 12/30/2008    Current Outpatient Medications:    acetaminophen (TYLENOL) 500 MG tablet, Take 1,000 mg by mouth every 6 (six) hours as needed for moderate pain or headache., Disp: , Rfl:    albuterol (PROAIR HFA) 108 (90 Base) MCG/ACT inhaler, Inhale 2 puffs into the lungs every 4 (four) hours as needed for wheezing or shortness of breath., Disp: 18 g, Rfl: 5   allopurinol (ZYLOPRIM) 100 MG tablet, TAKE 1 TABLET BY MOUTH EVERY DAY, Disp: 90 tablet, Rfl: 3   amiodarone (PACERONE) 200 MG tablet, Take 1 tablet (200 mg total) by mouth daily., Disp: 90 tablet, Rfl: 3   carvedilol (COREG) 3.125 MG tablet, Take 1 tablet (3.125 mg total) by mouth 2 (two) times daily., Disp: 180 tablet, Rfl: 3   ezetimibe-simvastatin (VYTORIN) 10-20 MG tablet, TAKE 1 TABLET BY MOUTH EVERYDAY AT BEDTIME, Disp: 90 tablet, Rfl: 3   finasteride (PROSCAR) 5 MG tablet, Take 5 mg by mouth daily., Disp: , Rfl:    KLOR-CON M20 20 MEQ tablet, TAKE 2 TABLETS (40 MEQ TOTAL) BY MOUTH 2 (TWO) TIMES DAILY., Disp: 120 tablet, Rfl: 1   levothyroxine (SYNTHROID) 50 MCG tablet, Take  1.5 tablets (75 mcg total) by mouth daily before breakfast., Disp: 90 tablet, Rfl: 3   metolazone (ZAROXOLYN) 5 MG tablet, Take 1 tablet (5 mg total) by mouth 3 (three) times a week. Tuesdaym, Thursday and Saturday, Disp: 15 tablet, Rfl: 5   polyethylene glycol powder (GLYCOLAX/MIRALAX)  powder, Take 17 g by mouth daily as needed for moderate constipation., Disp: , Rfl:    tamsulosin (FLOMAX) 0.4 MG CAPS capsule, Take 0.4 mg by mouth daily after supper. , Disp: , Rfl:    torsemide (DEMADEX) 100 MG tablet, Take 1 tablet (100 mg total) by mouth 2 (two) times daily., Disp: 60 tablet, Rfl: 3   triamcinolone ointment (KENALOG) 0.1 %, Apply 1 application topically 2 (two) times daily. (Patient taking differently: Apply 1 application topically daily as needed. ), Disp: 80 g, Rfl: 1   warfarin (COUMADIN) 5 MG tablet, TAKE  AS DIRECTED BY COUMADIN CLINIC, Disp: 100 tablet, Rfl: 1 Allergies  Allergen Reactions   Rifampin Rash and Other (See Comments)    May have been caused by Vancomycin or Rifampin (??)   Vancomycin Rash and Other (See Comments)    May have been caused by Vancomycin or Rifampin (??)      Social History   Socioeconomic History   Marital status: Married    Spouse name: Not on file   Number of children: 3   Years of education: Not on file   Highest education level: Not on file  Occupational History   Not on file  Tobacco Use   Smoking status: Former Smoker    Packs/day: 1.00    Years: 30.00    Pack years: 30.00    Types: Cigarettes    Quit date: 04/03/1992    Years since quitting: 27.9   Smokeless tobacco: Never Used  Vaping Use   Vaping Use: Never used  Substance and Sexual Activity   Alcohol use: Yes    Comment: 4-6 ounces of wine daily   Drug use: No    Comment: Half a cup a day.   Sexual activity: Not on file  Other Topics Concern   Not on file  Social History Narrative   Patient is married and lives with his wife.   Youngest daughter lives in [place] also.   Patient has 2 other children.   Social Determinants of Health   Financial Resource Strain:    Difficulty of Paying Living Expenses: Not on file  Food Insecurity:    Worried About Charity fundraiser in the Last Year: Not on file   [employer] of Food in the Last  Year: Not on file  Transportation Needs:    Lack of Transportation (Medical): Not on file   Lack of Transportation (Non-Medical): Not on file  Physical Activity:    Days of Exercise per Week: Not on file   Minutes of Exercise per Session: Not on file  Stress:    Feeling of Stress : Not on file  Social Connections:    Frequency of Communication with Friends and Family: Not on file   Frequency of Social Gatherings with Friends and Family: Not on file   Attends Religious Services: Not on file   Active Member of Clubs or Organizations: Not on file   Attends Archivist Meetings: Not on file   Marital Status: Not on file  Intimate Partner Violence:    Fear of Current or Ex-Partner: Not on file   Emotionally Abused: Not on file   Physically Abused:  Not on file   Sexually Abused: Not on file    Physical Exam      Future Appointments  Date Time Provider [place]  02/24/2020 12:45 PM [facility]  02/24/2020  2:00 PM [facility] PA/NP [facility] None  03/03/2020  2:45 PM Jeri Cos Eday III, PA-C [facility]  05/18/2020  8:45 AM [facility] None  05/18/2020  9:20 AM Brunetta Genera, MD [facility] None    BP (!) 102/0    Pulse (!) 56    Resp 20    Wt 250 lb (113.4 kg)    SpO2 93%    BMI 32.10 kg/m   Weight yesterday-250 Last visit weight-254  Pt seen at home today for IV lasix.  Pt states his weight has been stable for past few days.  He did not take his AM meds yet.  For some reason he wasn't seen on Friday for IV lasix but no weight gain per his report but his abd and legs do look swollen/distended.  He does have increased sob. Low urine output Saturday but yesterday he urinated a lot.  He is going to kidney doc this morning.  I was able to get 80mg  lasix in [facility] this morning at 950am. I couldn't stay until urination began b/c he had to go to appointment but I told them to call me  should he not urinate well today.  IV was removed.  He will resume meds except torsemide once he gets back home.  He has clinic appointment tomor and I will see him there.   Marylouise Stacks, [place] [facility] Paramedic  02/23/20

## 2020-02-24 ENCOUNTER — Other Ambulatory Visit: Payer: Self-pay

## 2020-02-24 ENCOUNTER — Ambulatory Visit (INDEPENDENT_AMBULATORY_CARE_PROVIDER_SITE_OTHER): Payer: Medicare Other

## 2020-02-24 ENCOUNTER — Other Ambulatory Visit (HOSPITAL_COMMUNITY): Payer: Self-pay

## 2020-02-24 ENCOUNTER — Other Ambulatory Visit: Payer: Self-pay | Admitting: Internal Medicine

## 2020-02-24 ENCOUNTER — Encounter (HOSPITAL_COMMUNITY): Payer: Self-pay

## 2020-02-24 ENCOUNTER — Ambulatory Visit (HOSPITAL_COMMUNITY)
Admission: RE | Admit: 2020-02-24 | Discharge: 2020-02-24 | Disposition: A | Discharge status: Home / Self Care | Payer: Medicare Other | Source: Ambulatory Visit | Attending: Cardiology | Admitting: Cardiology

## 2020-02-24 ENCOUNTER — Telehealth (HOSPITAL_COMMUNITY): Payer: Self-pay | Admitting: Cardiology

## 2020-02-24 VITALS — BP 110/70 | HR 72 | Wt 269.8 lb

## 2020-02-24 DIAGNOSIS — Z87891 Personal history of nicotine dependence: Secondary | ICD-10-CM | POA: Insufficient documentation

## 2020-02-24 DIAGNOSIS — I4891 Unspecified atrial fibrillation: Secondary | ICD-10-CM

## 2020-02-24 DIAGNOSIS — Z952 Presence of prosthetic heart valve: Secondary | ICD-10-CM | POA: Diagnosis not present

## 2020-02-24 DIAGNOSIS — E1122 Type 2 diabetes mellitus with diabetic chronic kidney disease: Secondary | ICD-10-CM | POA: Insufficient documentation

## 2020-02-24 DIAGNOSIS — D61818 Other pancytopenia: Secondary | ICD-10-CM | POA: Diagnosis not present

## 2020-02-24 DIAGNOSIS — N1832 Chronic kidney disease, stage 3b: Secondary | ICD-10-CM | POA: Insufficient documentation

## 2020-02-24 DIAGNOSIS — R0602 Shortness of breath: Secondary | ICD-10-CM

## 2020-02-24 DIAGNOSIS — I2510 Atherosclerotic heart disease of native coronary artery without angina pectoris: Secondary | ICD-10-CM | POA: Diagnosis not present

## 2020-02-24 DIAGNOSIS — R600 Localized edema: Secondary | ICD-10-CM

## 2020-02-24 DIAGNOSIS — D649 Anemia, unspecified: Secondary | ICD-10-CM | POA: Diagnosis not present

## 2020-02-24 DIAGNOSIS — Z951 Presence of aortocoronary bypass graft: Secondary | ICD-10-CM | POA: Diagnosis not present

## 2020-02-24 DIAGNOSIS — S81801D Unspecified open wound, right lower leg, subsequent encounter: Secondary | ICD-10-CM | POA: Diagnosis not present

## 2020-02-24 DIAGNOSIS — Z7901 Long term (current) use of anticoagulants: Secondary | ICD-10-CM | POA: Diagnosis not present

## 2020-02-24 DIAGNOSIS — Z79899 Other long term (current) drug therapy: Secondary | ICD-10-CM | POA: Insufficient documentation

## 2020-02-24 DIAGNOSIS — G4733 Obstructive sleep apnea (adult) (pediatric): Secondary | ICD-10-CM | POA: Insufficient documentation

## 2020-02-24 DIAGNOSIS — I051 Rheumatic mitral insufficiency: Secondary | ICD-10-CM | POA: Insufficient documentation

## 2020-02-24 DIAGNOSIS — I5022 Chronic systolic (congestive) heart failure: Secondary | ICD-10-CM | POA: Diagnosis not present

## 2020-02-24 DIAGNOSIS — I482 Chronic atrial fibrillation, unspecified: Secondary | ICD-10-CM | POA: Insufficient documentation

## 2020-02-24 DIAGNOSIS — E039 Hypothyroidism, unspecified: Secondary | ICD-10-CM | POA: Diagnosis not present

## 2020-02-24 DIAGNOSIS — J449 Chronic obstructive pulmonary disease, unspecified: Secondary | ICD-10-CM | POA: Diagnosis not present

## 2020-02-24 DIAGNOSIS — I50812 Chronic right heart failure: Secondary | ICD-10-CM

## 2020-02-24 LAB — BASIC METABOLIC PANEL
Anion gap: 12 (ref 5–15)
BUN: 106 mg/dL — ABNORMAL HIGH (ref 8–23)
CO2: 22 mmol/L (ref 22–32)
Calcium: 8.8 mg/dL — ABNORMAL LOW (ref 8.9–10.3)
Chloride: 94 mmol/L — ABNORMAL LOW (ref 98–111)
Creatinine, Ser: 3.5 mg/dL — ABNORMAL HIGH (ref 0.61–1.24)
GFR, Estimated: 17 mL/min — ABNORMAL LOW (ref >60)
Glucose, Bld: 118 mg/dL — ABNORMAL HIGH (ref 70–99)
Potassium: 5.9 mmol/L — ABNORMAL HIGH (ref 3.5–5.1)
Sodium: 128 mmol/L — ABNORMAL LOW (ref 135–145)

## 2020-02-24 LAB — IRON AND TIBC
Iron: 47 ug/dL (ref 45–182)
Saturation Ratios: 10 % — ABNORMAL LOW (ref 17.9–39.5)
TIBC: 454 ug/dL — ABNORMAL HIGH (ref 250–450)
UIBC: 407 ug/dL

## 2020-02-24 LAB — CBC
HCT: 27.6 % — ABNORMAL LOW (ref 39.0–52.0)
Hemoglobin: 8.2 g/dL — ABNORMAL LOW (ref 13.0–17.0)
MCH: 31.5 pg (ref 26.0–34.0)
MCHC: 29.7 g/dL — ABNORMAL LOW (ref 30.0–36.0)
MCV: 106.2 fL — ABNORMAL HIGH (ref 80.0–100.0)
Platelets: 91 10*3/uL — ABNORMAL LOW (ref 150–400)
RBC: 2.6 MIL/uL — ABNORMAL LOW (ref 4.22–5.81)
RDW: 18.2 % — ABNORMAL HIGH (ref 11.5–15.5)
WBC: 6.3 10*3/uL (ref 4.0–10.5)
nRBC: 0 % (ref 0.0–0.2)

## 2020-02-24 LAB — POCT INR: INR: 3.5 — AB (ref 2.0–3.0)

## 2020-02-24 LAB — FERRITIN: Ferritin: 85 ng/mL (ref 24–336)

## 2020-02-24 MED ORDER — ALBUTEROL SULFATE HFA 108 (90 BASE) MCG/ACT IN AERS: 2.0000 | INHALATION_SPRAY | RESPIRATORY_TRACT | 5 refills | Status: DC | PRN

## 2020-02-24 MED ORDER — LOKELMA 10 G PO PACK: 10.0000 g | PACK | Freq: Once | ORAL | 0 refills | Status: AC

## 2020-02-24 NOTE — Patient Instructions (Signed)
It was great to see you today! No medication changes are needed at this time.  Labs today We will only contact you if something comes back abnormal or we need to make some changes. Otherwise no news is good news!  You have been referred to [facility] to assist with lower leg edema/unna boots. They will be in contact to arrange a home visit  If you have any questions or concerns before your next appointment please send [place] a message through [place] or call our office at [phone].    TO LEAVE A MESSAGE FOR THE NURSE SELECT OPTION 2, PLEASE LEAVE A MESSAGE INCLUDING:  YOUR NAME  DATE OF BIRTH  CALL BACK NUMBER  REASON FOR CALL**this is important as we prioritize the call backs  YOU WILL RECEIVE A CALL BACK THE SAME DAY AS LONG AS YOU CALL BEFORE 4:00 PM

## 2020-02-24 NOTE — Progress Notes (Signed)
Advanced Heart Failure Clinic Note   PCP: [name], [name], MD PCP-Cardiologist: [name], MD  HF: [name]  HPI: [name] is a 84 y.o. male with h/o mechanical AVR 1994, OSA on CPAP, Chronic afib, chronic combined CHF, CKD III, chronic coumadin therapy, CAD s/p CAGB 1994, and COPD.   Admitted 7/18-11/05/17 from [facility] office with A/C diastolic HF. Diuresed 83 lbs with lasix drip, metolazone, and diamox. Transitioned to torsemide 80 mg BID. Determined not to be a candidate for MitraClip. Dr [name] also consulted and recommended aggressive medical therapy versus  percutaneous MVR trial as he felt very high risk for conventional surgery. Course complicated by drop in hemoglobin and symptomatic hypotension, requiring 1 unit pRBCs. GI consulted and did not recommend scoping. BB and spiro were stopped due to low BPs. Had some AKI assoicated with low BP, but resolved by discharge. Required lovenox bridge at DC with subtherapeutic INR. Discharged with [facility]. DC weight: 216 lbs.   Admitted 9/11 - 12/14/17 for dental extractions in setting of work up for MVR. Hospital course complicated by run of VT in the setting of hypokalemia. Started on amiodarone with h/o of arrhythmia. Bridged with lovenox.   Admitted 9/22 - 12/28/17 with symptomatic anemia from bleeding gums. Bleeding stopped with holding heparin and coumadin. Received multiple units of blood.   He was admitted again in 10/19 with left thigh hematoma and went from there to inpatient rehab.   He has been evaluated in [place] for a transcatheter mitral valve.  RHC/LHC was done in 1/20 as part of his pre-op evaluation.  Filling pressures and cardiac output were well-compensated, he had patent SVG-PDA.  Echo in 1/20 showed EF 40-45%, moderate RV dilation with normal systolic function, severe MR with likely rheumatic mitral valve, normal St Jude mechanical aortic valve. Unfortunately, it was determined that he would not qualify for any of  the percutaneous mitral valve studies.    His diuretic doses have been up and down based on volume retention and creatinine.   Echo in 4/21 showed EF 50-55% with moderate LV dilation, severe MR, mildly decreased RV systolic function, dilated IVC, stable mechanical aortic valve.   He returns to clinic today for f/u. Recently saw Dr. [name] and was fluid overloaded. Wt up 10 lb. Diuretics increased. He has been getting scheduled IV Lasix 2 days a week on Mondays and Fridays by par medicine. On non IV Lasix days, he takes torsemide 100 mg bid. He also takes 5 mg of metolazone tiw on T,Th,Sat.   He remains fluid overloaded ~3+ bilateral LEE. He missed his dose of IV Lasix this past Friday. Has not yet taken his metolazone and torsemide yet today. He is NYHA Class IIIb. He saw nephrology yesterday. They plan to refer to vascular surgery for AV fistula. He is still making urine but suspect he will need HD in the near future. BP is well controlled, 112/70.       Labs (10/19): hgb 9.2 Labs (11/19): K 3.2, creatinine 1.31 Labs (1/20): K 4.5, creatinine 1.38 Labs (2/20): LDL 64 Labs (6/20): K 4.1, creatinine 1.8, hgb 12.9 Labs (1/21): LDL 64 Labs (2/21): K 4, creatinine 2.9 => 2.47 Labs (3/21): K 4.9, creatinine 3 => 2.92, LFTs normal, elevated TSH with normal free T3 and T4 Labs (5/21): hgb 10, plts 53 Labs (6/21): K 3.7, creatinine 2.16 Labs (10/21): K 4.3, creatinine 3.19 => 2.79 Labs (11/21): LFTs normal, K 4, creatinine 2.77   ROS:  All  systems reviewed and negative except as per HPI.    PMH: 1. Carotid stenosis: S/p left CEA in 1994.  2. Atrial fibrillation: Chronic.  He is on warfarin with mechanical aortic valve.  3. Type II diabetes.  4. COPD: PFTs (11/19) with moderate obstructive airways disease and severely decreased DLCO.  5. OSA: Bipap recommended 6. 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  7. ITP 8. VT: On amiodarone.  9. 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  10. Rheumatic  mitral valve disease: TEE (7/19) showed severe MR with minimal mitral stenosis, suspect rheumatic mitral valve disease.  11. Chronic systolic CHF: TEE (3/32) with EF 55-60%, D-shaped septum, mildly dilated RV with mildly decreased systolic function, mechanical aortic valve, rheumatic-appearing mitral valve with severe MR, minimal stenosis.  - RHC (1/20): mean RA 6, PA 54/20, mean PCWP 15, CI 3.42, PVR 2.1 WU, PAPI 5.2 - Echo (1/20): EF 40-45%, moderate RV dilation with normal systolic function, severe biatrial enlargement, severe rheumatic MR, St Jude mechanical aortic valve functioning normally.  - Echo (3/21): EF 50-55% with moderate LV dilation, severe MR, mildly decreased RV systolic function, dilated IVC, stable mechanical aortic valve.  12. ABIs (12/18): Normal.  13. CAD: S/p CABG 1994.  - LHC (1/20): 50% D1, 60% ostial LCx, totally occluded RCA with patent SVG-PDA.     Current Outpatient Medications  Medication Sig Dispense Refill  . acetaminophen (TYLENOL) 500 MG tablet Take 1,000 mg by mouth every 6 (six) hours as needed for moderate pain or headache.    . albuterol (PROAIR HFA) 108 (90 Base) MCG/ACT inhaler Inhale 2 puffs into the lungs every 4 (four) hours as needed for wheezing or shortness of breath. 18 g 5  . allopurinol (ZYLOPRIM) 100 MG tablet TAKE 1 TABLET BY MOUTH EVERY DAY 90 tablet 3  . amiodarone (PACERONE) 200 MG tablet Take 1 tablet (200 mg total) by mouth daily. 90 tablet 3  . carvedilol (COREG) 3.125 MG tablet Take 1 tablet (3.125 mg total) by mouth 2 (two) times daily. 180 tablet 3  . ezetimibe-simvastatin (VYTORIN) 10-20 MG tablet TAKE 1 TABLET BY MOUTH EVERYDAY AT BEDTIME 90 tablet 3  . finasteride (PROSCAR) 5 MG tablet Take 5 mg by mouth daily.    Marland Kitchen levothyroxine (SYNTHROID) 50 MCG tablet Take 1.5 tablets (75 mcg total) by mouth daily before breakfast. 90 tablet 3  . metolazone (ZAROXOLYN) 5 MG tablet Take 1 tablet (5 mg total) by mouth 3 (three) times a week.  Tuesdaym, Thursday and Saturday 15 tablet 5  . polyethylene glycol powder (GLYCOLAX/MIRALAX) powder Take 17 g by mouth daily as needed for moderate constipation.    . potassium chloride SA (KLOR-CON) 20 MEQ tablet Take 20 mEq by mouth 2 (two) times daily. 3 tabs in am 2 tabs in PM    . tamsulosin (FLOMAX) 0.4 MG CAPS capsule Take 0.4 mg by mouth daily after supper.     . torsemide (DEMADEX) 100 MG tablet Take 1 tablet (100 mg total) by mouth 2 (two) times daily. 60 tablet 3  . triamcinolone ointment (KENALOG) 0.1 % Apply 1 application topically 2 (two) times daily. (Patient taking differently: Apply 1 application topically daily as needed. ) 80 g 1  . warfarin (COUMADIN) 5 MG tablet TAKE  AS DIRECTED BY COUMADIN CLINIC 100 tablet 1   No current facility-administered medications for this encounter.   Allergies  Allergen Reactions  . Rifampin Rash and Other (See Comments)    May have been caused by Vancomycin  or Rifampin (??)  . Vancomycin Rash and Other (See Comments)    May have been caused by Vancomycin or Rifampin (??)   Social History   Socioeconomic History  . Marital status: Married    Spouse name: Not on file  . Number of children: 3  . Years of education: Not on file  . Highest education level: Not on file  Occupational History  . Not on file  Tobacco Use  . Smoking status: Former Smoker    Packs/day: 1.00    Years: 30.00    Pack years: 30.00    Types: Cigarettes    Quit date: 04/03/1992    Years since quitting: 27.9  . Smokeless tobacco: Never Used  Vaping Use  . Vaping Use: Never used  Substance and Sexual Activity  . Alcohol use: Yes    Comment: 4-6 ounces of wine daily  . Drug use: No    Comment: Half a cup a day.  Marland Kitchen Sexual activity: Not on file  Other Topics Concern  . Not on file  Social History Narrative   Patient is married and lives with his wife.   Youngest daughter lives in [place] also.   Patient has 2 other children.   Social Determinants of  Health   Financial Resource Strain:   . Difficulty of Paying Living Expenses: Not on file  Food Insecurity:   . Worried About Charity fundraiser in the Last Year: Not on file  . Ran Out of Food in the Last Year: Not on file  Transportation Needs:   . Lack of Transportation (Medical): Not on file  . Lack of Transportation (Non-Medical): Not on file  Physical Activity:   . Days of Exercise per Week: Not on file  . Minutes of Exercise per Session: Not on file  Stress:   . Feeling of Stress : Not on file  Social Connections:   . Frequency of Communication with Friends and Family: Not on file  . Frequency of Social Gatherings with Friends and Family: Not on file  . Attends Religious Services: Not on file  . Active Member of Clubs or Organizations: Not on file  . Attends Archivist Meetings: Not on file  . Marital Status: Not on file  Intimate Partner Violence:   . Fear of Current or Ex-Partner: Not on file  . Emotionally Abused: Not on file  . Physically Abused: Not on file  . Sexually Abused: Not on file    Family History  Problem Relation Age of Onset  . Cancer Father        lung   . Hypertension Mother    Vitals:   02/24/20 1352  BP: 110/70  Pulse: 72  SpO2: 99%  Weight: 122.4 kg (269 lb 12.8 oz)     Wt Readings from Last 3 Encounters:  02/24/20 122.4 kg (269 lb 12.8 oz)  02/23/20 113.4 kg (250 lb)  02/16/20 115.2 kg (254 lb)   PHYSICAL EXAM: General:  Mildly fatigue appearing elderly WM in wheel chair. No respiratory difficulty HEENT: normal Neck: supple.  JVD 10 cm. Carotids 2+ bilat; no bruits. No lymphadenopathy or thyromegaly appreciated. Cor: PMI nondisplaced. Regular rate & rhythm. No rubs, gallops or murmurs. Lungs: clear Abdomen: soft, nontender, nondistended. No hepatosplenomegaly. No bruits or masses. Good bowel sounds. Extremities: no cyanosis, clubbing, rash, 2-3+ bilateral LEE  Neuro: alert & oriented x 3, cranial nerves grossly intact.  moves all 4 extremities w/o difficulty. Affect pleasant.   ASSESSMENT &  PLAN: 1. Chronic systolic CHF with prominent RV failure:  Echo in 3/21 with EF 50-55%, mildly decreased RV systolic function, severe rheumatic mitral regurgitation, normal mechanical aortic valve.  Severe MR likely plays a significant role in CHF and RV failure. NYHA class Illb-IV.  He remains markedly volume overloaded and missed his last dose of IV Lasix.  - he needs LE compression. Refer to home health for placement of unna boots.  - paramedicine to continue IV Lasix 80 mg. We will arrange for him to get an extra dose of IV Lasix this week, tomorrow and again on Friday.    - continue torsemide 100 mg bid on non IV lasix days - continue metolazone 5 mg tiw, T,TH,Sat  - He is off spironolactone with elevated creatinine.  - Continue Coreg 3.125 mg bid. - suspect he is nearing HD. Nephrology following and referring to [facility] for AV fistual for HD access. He may not tolerate well long-term with his heart disease, though his RV was only mildly dysfunctional on last echo.   2. Mechanical aortic valve: Appeared to function well on last echo.  - Continue warfarin with INR goal 2.5-3.5 (also with atrial fibrillation).  - He is off aspirin with spontaneous thigh hematoma.   3. Atrial fibrillation: Chronic, rate is controlled.  4. CAD: s/p CABG.  stable. Denies CP.  Cath in 1/20 with patent SVG-PDA, no interventional target.  - No ASA given stable CAD with warfarin use.   - Continue Vytorin, good lipids in 1/21.  5. COPD: He is no longer using oxygen during the day.  Moderate COPD on 11/19 PFTs.  Oxygen saturation ok at rest today.  - Paramedicine following and will walk him at home when he has his walker, will get home oxygen for him if oxygen saturation drops below 88% 6. OSA: Says he cannot tolerate bipap.  7.CKD Stage 3b: per above, followed by nephrology, suspect nearing HD - check BMP today  8. Thrombocytopenia: Chronic, has  been mild/stable.   - check CBC today  9. Severe MR: TEE 7/19 with severe MR with possible rheumatic MV (do not think MS is significant, mildly elevated mean gradient from high flow with severe MR).  Structural Heart team evaluated, not a Mitraclip candidate. Seen by Dr. [name], would be high risk for open surgery.  He was evaluated at [facility] in [place] for percutaneous mitral valve trials.  Unfortunately, he does not qualify for any of the available trials.  We will have to continue medical management.  10. VT: He is on amiodarone to suppress VT.  Recent LFTs normal.  Hypothyroidism followed by PCP.  Will need regular eye exam on amiodarone.  11. Anemia: Melena noted in past when INR was 10, now resolved.  -wife concerned about increasing fatigue. Suspect related to CHF but will check CBC, Fe, TIBC and ferritin. If sats low, may need feramheme  12. RLE wound: He has an appointment in wound care clinic 12/1.  Poor healing in setting of edematous leg.  - needs unna boots. Refer to [facility]    Continue w/ Paramedicine. Appreciate their assistance. Followup in clinic again in  3 weeks.    Lyda Jester, PA-C  02/24/2020

## 2020-02-24 NOTE — Progress Notes (Signed)
SATURATION QUALIFICATIONS: (This note is used to comply with regulatory documentation for home oxygen)  Patient Saturations on Room Air at Rest = 99%  Patient Saturations on Room Air while Ambulating = 86%  Patient Saturations on 2 Liters of oxygen while Ambulating = 99%  Please briefly explain why patient needs home oxygen:CHF

## 2020-02-24 NOTE — Patient Instructions (Signed)
Description   Take 1/2 tablet today, then resume same dosage 1/2 tablet daily except for 1 tablet on Tuesdays and Saturdays. Be consistent with your 3-4 servings of greens per week. Recheck INR in 3 weeks. Coumadin Clinic [phone] Fax [phone]

## 2020-02-24 NOTE — Addendum Note (Signed)
Encounter addended by: Kerry Dory, CMA on: 02/24/2020 4:25 PM  Actions taken: Clinical Note Signed, Visit diagnoses modified, Order list changed, Diagnosis association updated

## 2020-02-24 NOTE — Telephone Encounter (Signed)
-----   Message from Consuelo Pandy, [place] sent at 02/24/2020  4:32 PM EST ----- K elevated 5.9. SCr/BUN worse. Instruct not to take PM dose of KCl. Go to ED for abnormal labs. Needs lokelma.

## 2020-02-24 NOTE — Progress Notes (Signed)
Paramedicine Encounter   Patient ID: [name] , male,   DOB: 09/17/1935,84 y.o.,  MRN: [id number]   Met patient in clinic today with provider.   Weight @ clinic-269 B/p-110/70 p-72 sp02-99  Pt weight is up from last clinic visit approx 11-12 lbs.  He went to kidney spec yesterday and they are going to proceed with the start of dialysis process.  He has appoint with wound clinic next week.  Labs done today.  Will f/u on the 02 order.  He is using his inhaler much more times during the day-he is running low on those inhalers.  So hopefully he will get approved for the 02 and use that and not need the inhaler as much.  Brittiany wants him to do IV lasix tomor as well.  Labs done today.    , EMT-Paramedic [phone] 02/24/2020       

## 2020-02-24 NOTE — Telephone Encounter (Signed)
Pt aware of results however not agreeable to report to ER.  Per VO [place], [place] hold potassium, take one packet 10 grams of lokelma tonight, repeat labs 11/24 with Joellen [place], [facility] to apply unna boots.  Wife aware of plan, meds sent to pharmacy

## 2020-02-25 ENCOUNTER — Ambulatory Visit (HOSPITAL_COMMUNITY)
Admission: RE | Admit: 2020-02-25 | Discharge: 2020-02-25 | Disposition: A | Discharge status: Home / Self Care | Payer: Medicare Other | Source: Ambulatory Visit | Attending: Internal Medicine | Admitting: Internal Medicine

## 2020-02-25 DIAGNOSIS — I5032 Chronic diastolic (congestive) heart failure: Secondary | ICD-10-CM | POA: Diagnosis not present

## 2020-02-25 LAB — BASIC METABOLIC PANEL
Anion gap: 13 (ref 5–15)
BUN: 104 mg/dL — ABNORMAL HIGH (ref 8–23)
CO2: 22 mmol/L (ref 22–32)
Calcium: 8.3 mg/dL — ABNORMAL LOW (ref 8.9–10.3)
Chloride: 89 mmol/L — ABNORMAL LOW (ref 98–111)
Creatinine, Ser: 3.23 mg/dL — ABNORMAL HIGH (ref 0.61–1.24)
GFR, Estimated: 18 mL/min — ABNORMAL LOW (ref >60)
Glucose, Bld: 147 mg/dL — ABNORMAL HIGH (ref 70–99)
Potassium: 3.9 mmol/L (ref 3.5–5.1)
Sodium: 124 mmol/L — ABNORMAL LOW (ref 135–145)

## 2020-02-26 DIAGNOSIS — S81801D Unspecified open wound, right lower leg, subsequent encounter: Secondary | ICD-10-CM | POA: Diagnosis not present

## 2020-02-27 ENCOUNTER — Other Ambulatory Visit (HOSPITAL_COMMUNITY): Payer: Self-pay | Admitting: Adult Health

## 2020-03-01 ENCOUNTER — Telehealth (HOSPITAL_COMMUNITY): Payer: Self-pay

## 2020-03-01 ENCOUNTER — Other Ambulatory Visit (HOSPITAL_COMMUNITY): Payer: Self-pay

## 2020-03-01 ENCOUNTER — Telehealth: Payer: Self-pay

## 2020-03-01 ENCOUNTER — Other Ambulatory Visit: Payer: Self-pay

## 2020-03-01 ENCOUNTER — Ambulatory Visit (HOSPITAL_COMMUNITY)
Admission: RE | Admit: 2020-03-01 | Discharge: 2020-03-01 | Disposition: A | Discharge status: Home / Self Care | Payer: Medicare Other | Source: Ambulatory Visit | Attending: Cardiology | Admitting: Cardiology

## 2020-03-01 ENCOUNTER — Ambulatory Visit (INDEPENDENT_AMBULATORY_CARE_PROVIDER_SITE_OTHER): Payer: Medicare Other

## 2020-03-01 DIAGNOSIS — K767 Hepatorenal syndrome: Secondary | ICD-10-CM | POA: Diagnosis not present

## 2020-03-01 DIAGNOSIS — J181 Lobar pneumonia, unspecified organism: Secondary | ICD-10-CM | POA: Diagnosis not present

## 2020-03-01 DIAGNOSIS — I2510 Atherosclerotic heart disease of native coronary artery without angina pectoris: Secondary | ICD-10-CM | POA: Diagnosis not present

## 2020-03-01 DIAGNOSIS — I5043 Acute on chronic combined systolic (congestive) and diastolic (congestive) heart failure: Secondary | ICD-10-CM | POA: Diagnosis not present

## 2020-03-01 DIAGNOSIS — N186 End stage renal disease: Secondary | ICD-10-CM | POA: Diagnosis not present

## 2020-03-01 DIAGNOSIS — I4891 Unspecified atrial fibrillation: Secondary | ICD-10-CM

## 2020-03-01 DIAGNOSIS — Z20822 Contact with and (suspected) exposure to covid-19: Secondary | ICD-10-CM | POA: Diagnosis not present

## 2020-03-01 DIAGNOSIS — Z23 Encounter for immunization: Secondary | ICD-10-CM | POA: Diagnosis not present

## 2020-03-01 DIAGNOSIS — Z952 Presence of prosthetic heart valve: Secondary | ICD-10-CM | POA: Diagnosis not present

## 2020-03-01 DIAGNOSIS — I130 Hypertensive heart and chronic kidney disease with heart failure and stage 1 through stage 4 chronic kidney disease, or unspecified chronic kidney disease: Secondary | ICD-10-CM | POA: Diagnosis not present

## 2020-03-01 DIAGNOSIS — N184 Chronic kidney disease, stage 4 (severe): Secondary | ICD-10-CM | POA: Diagnosis not present

## 2020-03-01 DIAGNOSIS — I5023 Acute on chronic systolic (congestive) heart failure: Secondary | ICD-10-CM | POA: Diagnosis not present

## 2020-03-01 DIAGNOSIS — E1129 Type 2 diabetes mellitus with other diabetic kidney complication: Secondary | ICD-10-CM | POA: Diagnosis not present

## 2020-03-01 DIAGNOSIS — R0989 Other specified symptoms and signs involving the circulatory and respiratory systems: Secondary | ICD-10-CM | POA: Diagnosis not present

## 2020-03-01 DIAGNOSIS — R0602 Shortness of breath: Secondary | ICD-10-CM | POA: Diagnosis not present

## 2020-03-01 DIAGNOSIS — R570 Cardiogenic shock: Secondary | ICD-10-CM | POA: Diagnosis not present

## 2020-03-01 DIAGNOSIS — I5032 Chronic diastolic (congestive) heart failure: Secondary | ICD-10-CM | POA: Insufficient documentation

## 2020-03-01 DIAGNOSIS — J90 Pleural effusion, not elsewhere classified: Secondary | ICD-10-CM | POA: Diagnosis not present

## 2020-03-01 DIAGNOSIS — N250 Renal osteodystrophy: Secondary | ICD-10-CM | POA: Diagnosis not present

## 2020-03-01 DIAGNOSIS — I110 Hypertensive heart disease with heart failure: Secondary | ICD-10-CM | POA: Diagnosis not present

## 2020-03-01 DIAGNOSIS — N179 Acute kidney failure, unspecified: Secondary | ICD-10-CM | POA: Diagnosis not present

## 2020-03-01 DIAGNOSIS — I509 Heart failure, unspecified: Secondary | ICD-10-CM | POA: Diagnosis not present

## 2020-03-01 LAB — BASIC METABOLIC PANEL
Anion gap: 13 (ref 5–15)
BUN: 111 mg/dL — ABNORMAL HIGH (ref 8–23)
CO2: 23 mmol/L (ref 22–32)
Calcium: 8.3 mg/dL — ABNORMAL LOW (ref 8.9–10.3)
Chloride: 86 mmol/L — ABNORMAL LOW (ref 98–111)
Creatinine, Ser: 3.32 mg/dL — ABNORMAL HIGH (ref 0.61–1.24)
GFR, Estimated: 18 mL/min — ABNORMAL LOW (ref >60)
Glucose, Bld: 115 mg/dL — ABNORMAL HIGH (ref 70–99)
Potassium: 3.8 mmol/L (ref 3.5–5.1)
Sodium: 122 mmol/L — ABNORMAL LOW (ref 135–145)

## 2020-03-01 LAB — PROTIME-INR
INR: 3.2 — ABNORMAL HIGH (ref 0.8–1.2)
Prothrombin Time: 31.7 seconds — ABNORMAL HIGH (ref 11.4–15.2)

## 2020-03-01 NOTE — Progress Notes (Signed)
Came out today for lab recheck. BMET and INR.   Marylouise Stacks, EMT-Paramedic  03/01/20

## 2020-03-01 NOTE — Telephone Encounter (Signed)
INR 3.2, INR goal range 2.5-3.5.  Pt is in goal range, no change in Warfarin dosage. Called pt to notify of INR results.  See anticoagulation note in Epic.

## 2020-03-01 NOTE — Telephone Encounter (Signed)
[name] with [facility] have been unsuccessful with reaching patient about his home oxygen, they have called several times and left vms with no callback fyi

## 2020-03-01 NOTE — Telephone Encounter (Signed)
-----   Message from Marylouise Stacks, EMT sent at 03/01/2020 12:41 PM EST ----- Hello!  I went out for labs today for BMET and noticed he had a LOT of bruising to his arms that came up out of the blue, no injuries or anything like that and nosebleed last night (he used afrin spray to resolve that) -so I drew for another INR check if you could look at his results to see if there are any changes from that.    Thank you!  Marylouise Stacks, EMT-Paramedic  03/01/20

## 2020-03-01 NOTE — Patient Instructions (Signed)
Description   Called spoke with pt, advised to continue on same dosage 1/2 tablet daily except for 1 tablet on Tuesdays and Saturdays. Be consistent with your 3-4 servings of greens per week. Recheck INR in 3 weeks. [facility] [phone] Fax [phone]

## 2020-03-02 ENCOUNTER — Other Ambulatory Visit: Payer: Self-pay

## 2020-03-02 ENCOUNTER — Inpatient Hospital Stay (HOSPITAL_COMMUNITY)
Admission: EM | Admit: 2020-03-02 | Discharge: 2020-03-16 | DRG: 441 | Disposition: A | Discharge status: Hospice | Payer: Medicare Other | Attending: Pulmonary Disease | Admitting: Pulmonary Disease

## 2020-03-02 ENCOUNTER — Emergency Department (HOSPITAL_COMMUNITY): Payer: Medicare Other

## 2020-03-02 ENCOUNTER — Encounter (HOSPITAL_COMMUNITY): Payer: Self-pay | Admitting: Emergency Medicine

## 2020-03-02 DIAGNOSIS — I959 Hypotension, unspecified: Secondary | ICD-10-CM | POA: Diagnosis not present

## 2020-03-02 DIAGNOSIS — N390 Urinary tract infection, site not specified: Secondary | ICD-10-CM | POA: Diagnosis present

## 2020-03-02 DIAGNOSIS — M62838 Other muscle spasm: Secondary | ICD-10-CM | POA: Diagnosis not present

## 2020-03-02 DIAGNOSIS — I5032 Chronic diastolic (congestive) heart failure: Secondary | ICD-10-CM | POA: Diagnosis present

## 2020-03-02 DIAGNOSIS — I130 Hypertensive heart and chronic kidney disease with heart failure and stage 1 through stage 4 chronic kidney disease, or unspecified chronic kidney disease: Secondary | ICD-10-CM | POA: Diagnosis not present

## 2020-03-02 DIAGNOSIS — Z20822 Contact with and (suspected) exposure to covid-19: Secondary | ICD-10-CM | POA: Diagnosis not present

## 2020-03-02 DIAGNOSIS — N400 Enlarged prostate without lower urinary tract symptoms: Secondary | ICD-10-CM | POA: Diagnosis present

## 2020-03-02 DIAGNOSIS — R601 Generalized edema: Secondary | ICD-10-CM | POA: Diagnosis not present

## 2020-03-02 DIAGNOSIS — I110 Hypertensive heart disease with heart failure: Secondary | ICD-10-CM | POA: Diagnosis not present

## 2020-03-02 DIAGNOSIS — E875 Hyperkalemia: Secondary | ICD-10-CM | POA: Diagnosis not present

## 2020-03-02 DIAGNOSIS — E1122 Type 2 diabetes mellitus with diabetic chronic kidney disease: Secondary | ICD-10-CM | POA: Diagnosis present

## 2020-03-02 DIAGNOSIS — J181 Lobar pneumonia, unspecified organism: Secondary | ICD-10-CM | POA: Diagnosis not present

## 2020-03-02 DIAGNOSIS — Z452 Encounter for adjustment and management of vascular access device: Secondary | ICD-10-CM

## 2020-03-02 DIAGNOSIS — Z8249 Family history of ischemic heart disease and other diseases of the circulatory system: Secondary | ICD-10-CM

## 2020-03-02 DIAGNOSIS — E039 Hypothyroidism, unspecified: Secondary | ICD-10-CM | POA: Diagnosis present

## 2020-03-02 DIAGNOSIS — J811 Chronic pulmonary edema: Secondary | ICD-10-CM | POA: Diagnosis not present

## 2020-03-02 DIAGNOSIS — I5043 Acute on chronic combined systolic (congestive) and diastolic (congestive) heart failure: Secondary | ICD-10-CM | POA: Diagnosis not present

## 2020-03-02 DIAGNOSIS — Z6834 Body mass index (BMI) 34.0-34.9, adult: Secondary | ICD-10-CM | POA: Diagnosis not present

## 2020-03-02 DIAGNOSIS — I5084 End stage heart failure: Secondary | ICD-10-CM | POA: Diagnosis present

## 2020-03-02 DIAGNOSIS — I248 Other forms of acute ischemic heart disease: Secondary | ICD-10-CM | POA: Diagnosis present

## 2020-03-02 DIAGNOSIS — I340 Nonrheumatic mitral (valve) insufficiency: Secondary | ICD-10-CM | POA: Diagnosis not present

## 2020-03-02 DIAGNOSIS — I2510 Atherosclerotic heart disease of native coronary artery without angina pectoris: Secondary | ICD-10-CM | POA: Diagnosis present

## 2020-03-02 DIAGNOSIS — D7589 Other specified diseases of blood and blood-forming organs: Secondary | ICD-10-CM | POA: Diagnosis present

## 2020-03-02 DIAGNOSIS — Z515 Encounter for palliative care: Secondary | ICD-10-CM

## 2020-03-02 DIAGNOSIS — R609 Edema, unspecified: Secondary | ICD-10-CM | POA: Diagnosis not present

## 2020-03-02 DIAGNOSIS — Z66 Do not resuscitate: Secondary | ICD-10-CM | POA: Diagnosis present

## 2020-03-02 DIAGNOSIS — E118 Type 2 diabetes mellitus with unspecified complications: Secondary | ICD-10-CM | POA: Diagnosis not present

## 2020-03-02 DIAGNOSIS — R300 Dysuria: Secondary | ICD-10-CM | POA: Diagnosis not present

## 2020-03-02 DIAGNOSIS — Z79899 Other long term (current) drug therapy: Secondary | ICD-10-CM

## 2020-03-02 DIAGNOSIS — Z7401 Bed confinement status: Secondary | ICD-10-CM

## 2020-03-02 DIAGNOSIS — N185 Chronic kidney disease, stage 5: Secondary | ICD-10-CM | POA: Diagnosis not present

## 2020-03-02 DIAGNOSIS — R0602 Shortness of breath: Secondary | ICD-10-CM | POA: Diagnosis not present

## 2020-03-02 DIAGNOSIS — Z7901 Long term (current) use of anticoagulants: Secondary | ICD-10-CM

## 2020-03-02 DIAGNOSIS — I440 Atrioventricular block, first degree: Secondary | ICD-10-CM | POA: Diagnosis not present

## 2020-03-02 DIAGNOSIS — I482 Chronic atrial fibrillation, unspecified: Secondary | ICD-10-CM | POA: Diagnosis not present

## 2020-03-02 DIAGNOSIS — I132 Hypertensive heart and chronic kidney disease with heart failure and with stage 5 chronic kidney disease, or end stage renal disease: Secondary | ICD-10-CM | POA: Diagnosis present

## 2020-03-02 DIAGNOSIS — I447 Left bundle-branch block, unspecified: Secondary | ICD-10-CM | POA: Diagnosis not present

## 2020-03-02 DIAGNOSIS — Z4901 Encounter for fitting and adjustment of extracorporeal dialysis catheter: Secondary | ICD-10-CM | POA: Diagnosis not present

## 2020-03-02 DIAGNOSIS — Z862 Personal history of diseases of the blood and blood-forming organs and certain disorders involving the immune mechanism: Secondary | ICD-10-CM

## 2020-03-02 DIAGNOSIS — E1129 Type 2 diabetes mellitus with other diabetic kidney complication: Secondary | ICD-10-CM | POA: Diagnosis not present

## 2020-03-02 DIAGNOSIS — Z952 Presence of prosthetic heart valve: Secondary | ICD-10-CM

## 2020-03-02 DIAGNOSIS — D649 Anemia, unspecified: Secondary | ICD-10-CM | POA: Diagnosis not present

## 2020-03-02 DIAGNOSIS — N186 End stage renal disease: Secondary | ICD-10-CM | POA: Diagnosis not present

## 2020-03-02 DIAGNOSIS — N250 Renal osteodystrophy: Secondary | ICD-10-CM | POA: Diagnosis not present

## 2020-03-02 DIAGNOSIS — J90 Pleural effusion, not elsewhere classified: Secondary | ICD-10-CM | POA: Diagnosis not present

## 2020-03-02 DIAGNOSIS — N179 Acute kidney failure, unspecified: Secondary | ICD-10-CM | POA: Diagnosis present

## 2020-03-02 DIAGNOSIS — Z881 Allergy status to other antibiotic agents status: Secondary | ICD-10-CM

## 2020-03-02 DIAGNOSIS — Z79890 Hormone replacement therapy (postmenopausal): Secondary | ICD-10-CM

## 2020-03-02 DIAGNOSIS — I5033 Acute on chronic diastolic (congestive) heart failure: Secondary | ICD-10-CM | POA: Diagnosis not present

## 2020-03-02 DIAGNOSIS — I878 Other specified disorders of veins: Secondary | ICD-10-CM | POA: Diagnosis present

## 2020-03-02 DIAGNOSIS — I50810 Right heart failure, unspecified: Secondary | ICD-10-CM | POA: Diagnosis not present

## 2020-03-02 DIAGNOSIS — E876 Hypokalemia: Secondary | ICD-10-CM | POA: Diagnosis present

## 2020-03-02 DIAGNOSIS — J449 Chronic obstructive pulmonary disease, unspecified: Secondary | ICD-10-CM | POA: Diagnosis present

## 2020-03-02 DIAGNOSIS — I4891 Unspecified atrial fibrillation: Secondary | ICD-10-CM | POA: Diagnosis not present

## 2020-03-02 DIAGNOSIS — I509 Heart failure, unspecified: Secondary | ICD-10-CM | POA: Diagnosis not present

## 2020-03-02 DIAGNOSIS — Z87891 Personal history of nicotine dependence: Secondary | ICD-10-CM

## 2020-03-02 DIAGNOSIS — R0902 Hypoxemia: Secondary | ICD-10-CM | POA: Diagnosis not present

## 2020-03-02 DIAGNOSIS — D696 Thrombocytopenia, unspecified: Secondary | ICD-10-CM | POA: Diagnosis present

## 2020-03-02 DIAGNOSIS — R570 Cardiogenic shock: Secondary | ICD-10-CM | POA: Diagnosis not present

## 2020-03-02 DIAGNOSIS — D631 Anemia in chronic kidney disease: Secondary | ICD-10-CM | POA: Diagnosis present

## 2020-03-02 DIAGNOSIS — R042 Hemoptysis: Secondary | ICD-10-CM

## 2020-03-02 DIAGNOSIS — J9811 Atelectasis: Secondary | ICD-10-CM | POA: Diagnosis not present

## 2020-03-02 DIAGNOSIS — R001 Bradycardia, unspecified: Secondary | ICD-10-CM | POA: Diagnosis not present

## 2020-03-02 DIAGNOSIS — K767 Hepatorenal syndrome: Principal | ICD-10-CM | POA: Diagnosis present

## 2020-03-02 DIAGNOSIS — E871 Hypo-osmolality and hyponatremia: Secondary | ICD-10-CM | POA: Diagnosis present

## 2020-03-02 DIAGNOSIS — G4733 Obstructive sleep apnea (adult) (pediatric): Secondary | ICD-10-CM | POA: Diagnosis present

## 2020-03-02 DIAGNOSIS — Z888 Allergy status to other drugs, medicaments and biological substances status: Secondary | ICD-10-CM

## 2020-03-02 DIAGNOSIS — Z951 Presence of aortocoronary bypass graft: Secondary | ICD-10-CM

## 2020-03-02 DIAGNOSIS — I5023 Acute on chronic systolic (congestive) heart failure: Secondary | ICD-10-CM | POA: Diagnosis not present

## 2020-03-02 DIAGNOSIS — D509 Iron deficiency anemia, unspecified: Secondary | ICD-10-CM | POA: Diagnosis present

## 2020-03-02 DIAGNOSIS — Z23 Encounter for immunization: Secondary | ICD-10-CM | POA: Diagnosis not present

## 2020-03-02 DIAGNOSIS — I10 Essential (primary) hypertension: Secondary | ICD-10-CM | POA: Diagnosis present

## 2020-03-02 DIAGNOSIS — N189 Chronic kidney disease, unspecified: Secondary | ICD-10-CM | POA: Diagnosis not present

## 2020-03-02 DIAGNOSIS — R0989 Other specified symptoms and signs involving the circulatory and respiratory systems: Secondary | ICD-10-CM | POA: Diagnosis not present

## 2020-03-02 DIAGNOSIS — N184 Chronic kidney disease, stage 4 (severe): Secondary | ICD-10-CM | POA: Diagnosis not present

## 2020-03-02 DIAGNOSIS — Z801 Family history of malignant neoplasm of trachea, bronchus and lung: Secondary | ICD-10-CM

## 2020-03-02 DIAGNOSIS — Z95828 Presence of other vascular implants and grafts: Secondary | ICD-10-CM

## 2020-03-02 DIAGNOSIS — B961 Klebsiella pneumoniae [K. pneumoniae] as the cause of diseases classified elsewhere: Secondary | ICD-10-CM | POA: Diagnosis present

## 2020-03-02 LAB — URINALYSIS, ROUTINE W REFLEX MICROSCOPIC
Bilirubin Urine: NEGATIVE
Glucose, UA: NEGATIVE mg/dL
Ketones, ur: NEGATIVE mg/dL
Nitrite: NEGATIVE
Protein, ur: NEGATIVE mg/dL
Specific Gravity, Urine: 1.008 (ref 1.005–1.030)
pH: 6 (ref 5.0–8.0)

## 2020-03-02 LAB — COMPREHENSIVE METABOLIC PANEL
ALT: 20 U/L (ref 0–44)
AST: 28 U/L (ref 15–41)
Albumin: 3.2 g/dL — ABNORMAL LOW (ref 3.5–5.0)
Alkaline Phosphatase: 136 U/L — ABNORMAL HIGH (ref 38–126)
Anion gap: 15 (ref 5–15)
BUN: 116 mg/dL — ABNORMAL HIGH (ref 8–23)
CO2: 24 mmol/L (ref 22–32)
Calcium: 8.6 mg/dL — ABNORMAL LOW (ref 8.9–10.3)
Chloride: 85 mmol/L — ABNORMAL LOW (ref 98–111)
Creatinine, Ser: 3.29 mg/dL — ABNORMAL HIGH (ref 0.61–1.24)
GFR, Estimated: 18 mL/min — ABNORMAL LOW (ref >60)
Glucose, Bld: 118 mg/dL — ABNORMAL HIGH (ref 70–99)
Potassium: 3.9 mmol/L (ref 3.5–5.1)
Sodium: 124 mmol/L — ABNORMAL LOW (ref 135–145)
Total Bilirubin: 1.1 mg/dL (ref 0.3–1.2)
Total Protein: 6.4 g/dL — ABNORMAL LOW (ref 6.5–8.1)

## 2020-03-02 LAB — CBC WITH DIFFERENTIAL/PLATELET
Abs Immature Granulocytes: 0.04 10*3/uL (ref 0.00–0.07)
Basophils Absolute: 0 10*3/uL (ref 0.0–0.1)
Basophils Relative: 0 %
Eosinophils Absolute: 0 10*3/uL (ref 0.0–0.5)
Eosinophils Relative: 1 %
HCT: 26.2 % — ABNORMAL LOW (ref 39.0–52.0)
Hemoglobin: 8.3 g/dL — ABNORMAL LOW (ref 13.0–17.0)
Immature Granulocytes: 1 %
Lymphocytes Relative: 11 %
Lymphs Abs: 0.5 10*3/uL — ABNORMAL LOW (ref 0.7–4.0)
MCH: 32.7 pg (ref 26.0–34.0)
MCHC: 31.7 g/dL (ref 30.0–36.0)
MCV: 103.1 fL — ABNORMAL HIGH (ref 80.0–100.0)
Monocytes Absolute: 0.4 10*3/uL (ref 0.1–1.0)
Monocytes Relative: 8 %
Neutro Abs: 3.5 10*3/uL (ref 1.7–7.7)
Neutrophils Relative %: 79 %
Platelets: 78 10*3/uL — ABNORMAL LOW (ref 150–400)
RBC: 2.54 MIL/uL — ABNORMAL LOW (ref 4.22–5.81)
RDW: 18.2 % — ABNORMAL HIGH (ref 11.5–15.5)
WBC: 4.4 10*3/uL (ref 4.0–10.5)
nRBC: 0 % (ref 0.0–0.2)

## 2020-03-02 LAB — PROTIME-INR
INR: 2.7 — ABNORMAL HIGH (ref 0.8–1.2)
Prothrombin Time: 28 seconds — ABNORMAL HIGH (ref 11.4–15.2)

## 2020-03-02 LAB — RESP PANEL BY RT-PCR (FLU A&B, COVID) ARPGX2
Influenza A by PCR: NEGATIVE
Influenza B by PCR: NEGATIVE
SARS Coronavirus 2 by RT PCR: NEGATIVE

## 2020-03-02 MED ORDER — ALLOPURINOL 100 MG PO TABS
100.0000 mg | ORAL_TABLET | Freq: Every day | ORAL | Status: DC
  Administered 2020-03-03 – 2020-03-16 (×14): 100 mg via ORAL
  Filled 2020-03-02 (×14): qty 1

## 2020-03-02 MED ORDER — CARVEDILOL 3.125 MG PO TABS: 3.1250 mg | ORAL_TABLET | Freq: Two times a day (BID) | ORAL | Status: DC

## 2020-03-02 MED ORDER — ACETAMINOPHEN 325 MG PO TABS
650.0000 mg | ORAL_TABLET | Freq: Four times a day (QID) | ORAL | Status: DC | PRN
  Administered 2020-03-03 – 2020-03-11 (×13): 650 mg via ORAL
  Filled 2020-03-02 (×13): qty 2

## 2020-03-02 MED ORDER — LIDOCAINE-PRILOCAINE 2.5-2.5 % EX CREA: 1.0000 "application " | TOPICAL_CREAM | CUTANEOUS | Status: DC | PRN

## 2020-03-02 MED ORDER — SODIUM CHLORIDE 0.9 % IV SOLN: 100.0000 mL | INTRAVENOUS | Status: DC | PRN

## 2020-03-02 MED ORDER — FINASTERIDE 5 MG PO TABS
5.0000 mg | ORAL_TABLET | Freq: Every day | ORAL | Status: DC
  Administered 2020-03-02 – 2020-03-15 (×14): 5 mg via ORAL
  Filled 2020-03-02 (×15): qty 1

## 2020-03-02 MED ORDER — FOLIC ACID 1 MG PO TABS
1000.0000 ug | ORAL_TABLET | Freq: Every day | ORAL | Status: DC
  Administered 2020-03-02 – 2020-03-15 (×14): 1 mg via ORAL
  Filled 2020-03-02 (×15): qty 1

## 2020-03-02 MED ORDER — CHLORHEXIDINE GLUCONATE CLOTH 2 % EX PADS: 6.0000 | MEDICATED_PAD | Freq: Every day | CUTANEOUS | Status: DC

## 2020-03-02 MED ORDER — EZETIMIBE 10 MG PO TABS
10.0000 mg | ORAL_TABLET | Freq: Every day | ORAL | Status: DC
  Administered 2020-03-02 – 2020-03-15 (×14): 10 mg via ORAL
  Filled 2020-03-02 (×14): qty 1

## 2020-03-02 MED ORDER — TAMSULOSIN HCL 0.4 MG PO CAPS
0.4000 mg | ORAL_CAPSULE | Freq: Every day | ORAL | Status: DC
  Administered 2020-03-03 – 2020-03-15 (×13): 0.4 mg via ORAL
  Filled 2020-03-02 (×13): qty 1

## 2020-03-02 MED ORDER — POLYETHYLENE GLYCOL 3350 17 GM/SCOOP PO POWD: 17.0000 g | Freq: Every day | ORAL | Status: DC | PRN

## 2020-03-02 MED ORDER — WARFARIN - PHARMACIST DOSING INPATIENT: Freq: Every day | Status: DC

## 2020-03-02 MED ORDER — PENTAFLUOROPROP-TETRAFLUOROETH EX AERO
1.0000 "application " | INHALATION_SPRAY | CUTANEOUS | Status: DC | PRN
  Filled 2020-03-02: qty 116

## 2020-03-02 MED ORDER — SODIUM CHLORIDE 0.9 % IV SOLN
510.0000 mg | Freq: Once | INTRAVENOUS | Status: AC
  Administered 2020-03-02: 510 mg via INTRAVENOUS
  Filled 2020-03-02: qty 17

## 2020-03-02 MED ORDER — POLYETHYLENE GLYCOL 3350 17 G PO PACK
17.0000 g | PACK | Freq: Every day | ORAL | Status: DC | PRN
  Administered 2020-03-04: 17 g via ORAL
  Filled 2020-03-02: qty 1

## 2020-03-02 MED ORDER — SIMVASTATIN 20 MG PO TABS
20.0000 mg | ORAL_TABLET | Freq: Every day | ORAL | Status: DC
  Administered 2020-03-02 – 2020-03-15 (×14): 20 mg via ORAL
  Filled 2020-03-02 (×14): qty 1

## 2020-03-02 MED ORDER — EZETIMIBE-SIMVASTATIN 10-20 MG PO TABS
1.0000 | ORAL_TABLET | Freq: Every day | ORAL | Status: DC
  Filled 2020-03-02: qty 1

## 2020-03-02 MED ORDER — LEVOTHYROXINE SODIUM 25 MCG PO TABS
75.0000 ug | ORAL_TABLET | Freq: Every day | ORAL | Status: DC
  Administered 2020-03-03 – 2020-03-16 (×14): 75 ug via ORAL
  Filled 2020-03-02 (×7): qty 3
  Filled 2020-03-02: qty 1
  Filled 2020-03-02 (×6): qty 3

## 2020-03-02 MED ORDER — ALBUTEROL SULFATE HFA 108 (90 BASE) MCG/ACT IN AERS: 2.0000 | INHALATION_SPRAY | RESPIRATORY_TRACT | Status: DC | PRN

## 2020-03-02 MED ORDER — ALTEPLASE 2 MG IJ SOLR
2.0000 mg | Freq: Once | INTRAMUSCULAR | Status: DC | PRN
  Filled 2020-03-02 (×2): qty 2

## 2020-03-02 MED ORDER — AMIODARONE HCL 200 MG PO TABS: 200.0000 mg | ORAL_TABLET | Freq: Every day | ORAL | Status: DC

## 2020-03-02 MED ORDER — VITAMIN B-12 1000 MCG PO TABS
1000.0000 ug | ORAL_TABLET | Freq: Every day | ORAL | Status: DC
  Administered 2020-03-02 – 2020-03-16 (×15): 1000 ug via ORAL
  Filled 2020-03-02 (×15): qty 1

## 2020-03-02 MED ORDER — FUROSEMIDE 10 MG/ML IJ SOLN
80.0000 mg | Freq: Once | INTRAMUSCULAR | Status: AC
  Administered 2020-03-02: 80 mg via INTRAVENOUS
  Filled 2020-03-02: qty 8

## 2020-03-02 MED ORDER — HEPARIN SODIUM (PORCINE) 1000 UNIT/ML DIALYSIS
1000.0000 [IU] | INTRAMUSCULAR | Status: DC | PRN
  Administered 2020-03-13: 11:00:00 2800 [IU] via INTRAVENOUS_CENTRAL
  Filled 2020-03-02 (×5): qty 1

## 2020-03-02 MED ORDER — LIDOCAINE HCL (PF) 1 % IJ SOLN: 5.0000 mL | INTRAMUSCULAR | Status: DC | PRN

## 2020-03-02 MED ORDER — WARFARIN SODIUM 5 MG PO TABS
5.0000 mg | ORAL_TABLET | Freq: Once | ORAL | Status: AC
  Administered 2020-03-02: 5 mg via ORAL
  Filled 2020-03-02: qty 1

## 2020-03-02 MED ORDER — ACETAMINOPHEN 650 MG RE SUPP: 650.0000 mg | Freq: Four times a day (QID) | RECTAL | Status: DC | PRN

## 2020-03-02 MED ORDER — WARFARIN SODIUM 2.5 MG PO TABS
2.5000 mg | ORAL_TABLET | Freq: Once | ORAL | Status: DC
  Filled 2020-03-02: qty 1

## 2020-03-02 NOTE — Progress Notes (Signed)
ANTICOAGULATION CONSULT NOTE   Pharmacy Consult for Warfarin Indication: Mechanical AVR  Allergies  Allergen Reactions  . Rifampin Rash and Other (See Comments)    May have been caused by Vancomycin or Rifampin (??)  . Vancomycin Rash and Other (See Comments)    May have been caused by Vancomycin or Rifampin (??)    Patient Measurements:   Heparin Dosing Weight:   Vital Signs: Temp: 97.3 F (36.3 C) (11/30 1501) Temp Source: Oral (11/30 1501) BP: 108/48 (11/30 2000) Pulse Rate: 48 (11/30 2000)  Labs: Recent Labs    03/01/20 1051 03/02/20 1510  HGB  --  8.3*  HCT  --  26.2*  PLT  --  78*  LABPROT 31.7* 28.0*  INR 3.2* 2.7*  CREATININE 3.32* 3.29*    Estimated Creatinine Clearance: 24.1 mL/min (A) (by C-G formula based on SCr of 3.29 mg/dL (H)).   Medical History: Past Medical History:  Diagnosis Date  . Carotid artery disease ([place]) 1994   s/p left carotid endarerectomy   . Chronic atrial fibrillation (HCC)    a. on coumadin   . Chronic diastolic CHF (congestive heart failure) ([place])   . Diabetes mellitus without complication ([place])    dx 2016  . Dyspnea   . Epistaxis   . Heart murmur   . Hx of CABG    a. 1994  . Hypertension   . OSA (obstructive sleep apnea)   . Rheumatic fever   . S/P AVR (aortic valve replacement)    a. mechanical valve 1996  . Subclavian bypass stenosis ([place])   . 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 Urinary catheter in place 12/2019  . Vitamin B12 deficiency     Medications:  Scheduled:  . [START ON 03/03/2020] Chlorhexidine Gluconate Cloth  6 each Topical Q0600  . warfarin  2.5 mg Oral Once  . [START ON 03/03/2020] Warfarin - Pharmacist Dosing Inpatient   Does not apply q1600    Assessment: Patient is a 20 yom that is being admitted for SOB after fluid overload in the setting of CKD. Patient is on warfarin at home for a Mechanical AVR. Patient was noted to have signs  on anemia and a hx of ITP however this likely 2/2 to worsening CKD and not bleeding. Will continue warfarin therapy at this time.   PTA Regimen: Warfarin 5mg  Sat and Tues and 2.5mg  ROW  Goal of Therapy:  INR 2.5-3.5 Monitor platelets by anticoagulation protocol: Yes   Plan:  - Patient is currently therapeutic at 2.7  - Will continue home regimen, Warfarin 5mg  PO x 1 dose  - Monitor patient for s/s of bleeding and cbc - Daily PT-INR  Duanne Limerick PharmD. BCPS  03/02/2020,9:16 PM

## 2020-03-02 NOTE — Consult Note (Signed)
Reason for Consult: CKD5 Referring Physician:  Dr. Maryan Rued  Chief Complaint:  Shortness of breath  Assessment/Plan: 1. CKDIV with baseline cr in the 2.5-3.4 range which likely reflects even worse function with the anasarca and hemodilution. He already has a foley which is draining well making obstruction less likely as a contributing factor. Advanced renal failure due to nephrosclerosis and cardiorenal followed by Dr. Johnney Ou at clinic. She had already referred him to [facility] for permanent access placement. I spent time again tonight educating him and although he is reluctant to start dialysis given the lifestyle change he wants to at least give it a trial. If he does not feel better or doesn't tolerate then he understands that he can stop RRT. - At this point he will need RRT to support volume management having failed aggressive diuretic regimen at home including IV diusesis. His baseline weight is ~218 lbs and he is up ~45 lbs. - Will request tunneled catheter placement by [facility] and initiate dialysis tomorrow. - Would give trial of a few weeks to month to determine if he tolerates and if it improves his QOL; I am not convinced he will do well on dialysis and he is not a candidate for PD. I've counseled him that he may not do well on dialysis and that many pts do not feel well for the rest of the day on dialysis days; he is advanced age, poor performance status + NYHA class IV. 2. Renal osteodystrophy - will check phos and PTH 3. Anemia - h/o transfusions in the past with h/o ITP. TSAT only 10% on 11/23 with ferritin of 85. Will give Feraheme and transfuse as needed. 4. CHF - will defer to the advanced heart failure team but may continue IV diuresis for tonight. Hopefully he will tolerate UF with dialysis; BP on low side. 5. Mechanical AVR on coumadin goal INR 2.5-3.5 6. Afib 7. CASHD s/p CABG and left CEA 8. COPD 9. DM 10. ITP    HPI: [name] is an 84 y.o. male mechanical AVR, OSA on CPAP,  afib, chronic coumadin therapy, CHF (EF 40-45% in 04/2018), severe MR (likely rheumatic valve) not deemed to be a candidate for conventional surgery, CKDIV with BL cr in the 2.5-3.4 range, CASHD with CABG 1994, left CEA, ITP,  DM, COPD, BPH with a chronic indwelling foley here with worsening swelling, weight gain and decreased mobility over the past 2 mths. He is on torsemide, metolazone and home IV furosemide 80mg  twice a week with UNNA boots placed around thanksgiving. The swelling in his lower extremities and UOP has increased somewhat with the UNNA boots but he continues to have shortness of breath and a drop in his O2 sat to the 80's with any exertion. He has orthopnea as well and uses an inclined bed with minimal improvement with inhaler use at home. He has been feeling weaker than usual over the past few days and can't even stand. He denies any NSAID use and has only used Tylenol for pain. He denies decreased UOP and the foley is scheduled to be changed next Friday by urology. He denies fever, chills, productive cough, rigor, syncopal episodes.  ROS Pertinent items are noted in HPI.  Chemistry and CBC: Creatinine  Date/Time Value Ref Range Status  11/12/2019 08:20 AM 3.05 (HH) 0.61 - 1.24 mg/dL Final    Comment:    CRITICAL RESULT CALLED TO, READ BACK BY AND VERIFIED WITH: VICTORIA OKORO AT 0915.RB   08/20/2019 11:05 AM 2.40 (H) 0.61 -  1.24 mg/dL Final  04/20/2017 09:43 AM 1.43 (H) 0.70 - 1.30 mg/dL Final  01/19/2017 08:27 AM 1.2 0.7 - 1.3 mg/dL Final  07/20/2016 08:49 AM 1.3 0.7 - 1.3 mg/dL Final  07/22/2015 08:50 AM 1.0 0.7 - 1.3 mg/dL Final  03/23/2015 08:32 AM 1.0 0.7 - 1.3 mg/dL Final  02/01/2015 11:02 AM 1.0 0.7 - 1.3 mg/dL Final  12/01/2014 10:48 AM 0.8 0.7 - 1.3 mg/dL Final  11/03/2014 12:50 PM 0.8 0.7 - 1.3 mg/dL Final  10/20/2014 12:29 PM 1.1 0.7 - 1.3 mg/dL Final   Creatinine, Ser  Date/Time Value Ref Range Status  03/02/2020 03:10 PM 3.29 (H) 0.61 - 1.24 mg/dL Final   03/01/2020 10:51 AM 3.32 (H) 0.61 - 1.24 mg/dL Final  02/25/2020 10:55 AM 3.23 (H) 0.61 - 1.24 mg/dL Final  02/24/2020 02:30 PM 3.50 (H) 0.61 - 1.24 mg/dL Final  02/09/2020 09:59 AM 2.90 (H) 0.61 - 1.24 mg/dL Final  02/04/2020 09:33 AM 2.77 (H) 0.61 - 1.24 mg/dL Final  02/02/2020 09:53 AM 2.50 (H) 0.61 - 1.24 mg/dL Final  01/08/2020 10:08 AM 2.79 (H) 0.61 - 1.24 mg/dL Final  12/02/2019 09:27 AM 3.19 (H) 0.61 - 1.24 mg/dL Final  10/14/2019 10:44 AM 2.82 (H) 0.61 - 1.24 mg/dL Final  07/28/2019 12:38 PM 2.59 (H) 0.61 - 1.24 mg/dL Final  07/21/2019 10:21 AM 2.44 (H) 0.61 - 1.24 mg/dL Final  07/14/2019 11:03 AM 2.72 (H) 0.61 - 1.24 mg/dL Final  07/01/2019 09:00 AM 2.92 (H) 0.76 - 1.27 mg/dL Final  06/18/2019 09:00 AM 3.00 (H) 0.61 - 1.24 mg/dL Final  06/11/2019 08:58 AM 2.68 (H) 0.61 - 1.24 mg/dL Final  06/02/2019 12:05 PM 2.62 (H) 0.61 - 1.24 mg/dL Final  05/21/2019 11:28 AM 2.47 (H) 0.61 - 1.24 mg/dL Final  05/13/2019 10:05 AM 2.70 (H) 0.61 - 1.24 mg/dL Final  05/07/2019 11:45 AM 2.92 (H) 0.61 - 1.24 mg/dL Final  04/29/2019 09:31 AM 2.75 (H) 0.61 - 1.24 mg/dL Final  04/21/2019 09:33 AM 2.45 (H) 0.61 - 1.24 mg/dL Final  03/25/2019 09:54 AM 2.27 (H) 0.40 - 1.50 mg/dL Final  12/24/2018 08:24 AM 2.17 (H) 0.61 - 1.24 mg/dL Final  12/13/2018 10:51 AM 2.12 (H) 0.61 - 1.24 mg/dL Final  12/03/2018 10:07 AM 2.05 (H) 0.61 - 1.24 mg/dL Final  09/24/2018 09:29 AM 1.80 (H) 0.40 - 1.50 mg/dL Final  09/02/2018 10:11 AM 1.79 (H) 0.61 - 1.24 mg/dL Final  08/19/2018 11:46 AM 2.02 (H) 0.61 - 1.24 mg/dL Final  07/01/2018 09:28 AM 1.55 (H) 0.40 - 1.50 mg/dL Final  06/10/2018 09:49 AM 1.67 (H) 0.61 - 1.24 mg/dL Final  05/27/2018 03:30 PM 1.44 (H) 0.61 - 1.24 mg/dL Final  04/30/2018 09:41 AM 1.38 (H) 0.61 - 1.24 mg/dL Final  03/26/2018 10:49 AM 1.31 0.40 - 1.50 mg/dL Final  03/21/2018 12:40 PM 1.48 (H) 0.61 - 1.24 mg/dL Final  02/26/2018 09:04 AM 1.54 (H) 0.40 - 1.50 mg/dL Final  02/19/2018 03:18 PM 1.33  (H) 0.61 - 1.24 mg/dL Final  01/28/2018 05:56 AM 1.34 (H) 0.61 - 1.24 mg/dL Final  01/27/2018 06:52 AM 1.39 (H) 0.61 - 1.24 mg/dL Final  01/25/2018 05:35 AM 1.35 (H) 0.61 - 1.24 mg/dL Final  01/22/2018 04:55 AM 1.52 (H) 0.61 - 1.24 mg/dL Final  01/21/2018 06:23 AM 1.26 (H) 0.61 - 1.24 mg/dL Final  01/19/2018 06:02 AM 1.10 0.61 - 1.24 mg/dL Final  01/18/2018 12:30 PM 1.06 0.61 - 1.24 mg/dL Final  01/18/2018 02:24 AM 1.08 0.61 - 1.24 mg/dL Final  01/17/2018 02:28  AM 1.03 0.61 - 1.24 mg/dL Final  01/16/2018 03:10 AM 1.07 0.61 - 1.24 mg/dL Final  01/14/2018 02:33 AM 1.12 0.61 - 1.24 mg/dL Final  01/13/2018 02:39 AM 1.29 (H) 0.61 - 1.24 mg/dL Final  01/12/2018 02:45 AM 1.40 (H) 0.61 - 1.24 mg/dL Final  01/11/2018 07:56 AM 1.48 (H) 0.61 - 1.24 mg/dL Final  01/09/2018 02:35 PM 1.85 (H) 0.61 - 1.24 mg/dL Final   Recent Labs  Lab 02/25/20 1055 03/01/20 1051 03/02/20 1510  NA 124* 122* 124*  K 3.9 3.8 3.9  CL 89* 86* 85*  CO2 22 23 24   GLUCOSE 147* 115* 118*  BUN 104* 111* 116*  CREATININE 3.23* 3.32* 3.29*  CALCIUM 8.3* 8.3* 8.6*   Recent Labs  Lab 03/02/20 1510  WBC 4.4  NEUTROABS 3.5  HGB 8.3*  HCT 26.2*  MCV 103.1*  PLT 78*   Liver Function Tests: Recent Labs  Lab 03/02/20 1510  AST 28  ALT 20  ALKPHOS 136*  BILITOT 1.1  PROT 6.4*  ALBUMIN 3.2*   No results for input(s): LIPASE, AMYLASE in the last 168 hours. No results for input(s): AMMONIA in the last 168 hours. Cardiac Enzymes: No results for input(s): CKTOTAL, CKMB, CKMBINDEX, TROPONINI in the last 168 hours. Iron Studies: No results for input(s): IRON, TIBC, TRANSFERRIN, FERRITIN in the last 72 hours. PT/INR: @LABRCNTIP (inr:5)  Xrays/Other Studies: ) Results for orders placed or performed during the hospital encounter of 03/02/20 (from the past 48 hour(s))  Comprehensive metabolic panel     Status: Abnormal   Collection Time: 03/02/20  3:10 PM  Result Value Ref Range   Sodium 124 (L) 135 - 145 mmol/L    Potassium 3.9 3.5 - 5.1 mmol/L   Chloride 85 (L) 98 - 111 mmol/L   CO2 24 22 - 32 mmol/L   Glucose, Bld 118 (H) 70 - 99 mg/dL    Comment: Glucose reference range applies only to samples taken after fasting for at least 8 hours.   BUN 116 (H) 8 - 23 mg/dL   Creatinine, Ser 3.29 (H) 0.61 - 1.24 mg/dL   Calcium 8.6 (L) 8.9 - 10.3 mg/dL   Total Protein 6.4 (L) 6.5 - 8.1 g/dL   Albumin 3.2 (L) 3.5 - 5.0 g/dL   AST 28 15 - 41 U/L   ALT 20 0 - 44 U/L   Alkaline Phosphatase 136 (H) 38 - 126 U/L   Total Bilirubin 1.1 0.3 - 1.2 mg/dL   GFR, Estimated 18 (L) >60 mL/min    Comment: (NOTE) Calculated using the CKD-EPI Creatinine Equation (2021)    Anion gap 15 5 - 15    Comment: Performed at [facility], [place] [address] [place], [place] [address]  CBC with Differential     Status: Abnormal   Collection Time: 03/02/20  3:10 PM  Result Value Ref Range   WBC 4.4 4.0 - 10.5 K/uL   RBC 2.54 (L) 4.22 - 5.81 MIL/uL   Hemoglobin 8.3 (L) 13.0 - 17.0 g/dL   HCT 26.2 (L) 39 - 52 %   MCV 103.1 (H) 80.0 - 100.0 fL   MCH 32.7 26.0 - 34.0 pg   MCHC 31.7 30.0 - 36.0 g/dL   RDW 18.2 (H) 11.5 - 15.5 %   Platelets 78 (L) 150 - 400 K/uL    Comment: REPEATED TO VERIFY PLATELET COUNT CONFIRMED BY SMEAR Immature Platelet Fraction may be clinically indicated, consider ordering this additional test [id number]    nRBC 0.0  0.0 - 0.2 %   Neutrophils Relative % 79 %   Neutro Abs 3.5 1.7 - 7.7 K/uL   Lymphocytes Relative 11 %   Lymphs Abs 0.5 (L) 0.7 - 4.0 K/uL   Monocytes Relative 8 %   Monocytes Absolute 0.4 0.1 - 1.0 K/uL   Eosinophils Relative 1 %   Eosinophils Absolute 0.0 0.0 - 0.5 K/uL   Basophils Relative 0 %   Basophils Absolute 0.0 0.0 - 0.1 K/uL   Immature Granulocytes 1 %   Abs Immature Granulocytes 0.04 0.00 - 0.07 K/uL    Comment: Performed at [facility] [address], [place], [place] [address]  Protime-INR     Status: Abnormal   Collection Time: 03/02/20  3:10 PM  Result  Value Ref Range   Prothrombin Time 28.0 (H) 11.4 - 15.2 seconds   INR 2.7 (H) 0.8 - 1.2    Comment: (NOTE) INR goal varies based on device and disease states. Performed at [facility], [place] [address], [place], [place] [address]   Urinalysis, Routine w reflex microscopic Urine, Random     Status: Abnormal   Collection Time: 03/02/20  3:52 PM  Result Value Ref Range   Color, Urine YELLOW YELLOW   APPearance CLEAR CLEAR   Specific Gravity, Urine 1.008 1.005 - 1.030   pH 6.0 5.0 - 8.0   Glucose, UA NEGATIVE NEGATIVE mg/dL   Hgb urine dipstick MODERATE (A) NEGATIVE   Bilirubin Urine NEGATIVE NEGATIVE   Ketones, ur NEGATIVE NEGATIVE mg/dL   Protein, ur NEGATIVE NEGATIVE mg/dL   Nitrite NEGATIVE NEGATIVE   Leukocytes,Ua LARGE (A) NEGATIVE   RBC / HPF 0-5 0 - 5 RBC/hpf   WBC, UA 21-50 0 - 5 WBC/hpf   Bacteria, UA FEW (A) NONE SEEN   Squamous Epithelial / LPF 0-5 0 - 5   Hyaline Casts, UA PRESENT    Amorphous Crystal PRESENT     Comment: Performed at [facility], [address] [place], [place] [address]   DG Chest Port 1 View  Result Date: 03/02/2020 CLINICAL DATA:  Hyponatremia, lower extremity edema, decreased renal function, short of breath EXAM: PORTABLE CHEST 1 VIEW COMPARISON:  01/09/2018 FINDINGS: Single frontal view of the chest demonstrates an enlarged cardiac silhouette. There is chronic central vascular congestion, with diffuse interstitial prominence likely reflecting an element of interstitial edema. There is also left basilar consolidation and small left pleural effusion, favor asymmetric edema over infection. No pneumothorax. No acute bony abnormalities. IMPRESSION: 1. Findings most consistent with congestive heart failure. Electronically Signed   By: Randa Ngo M.D.   On: 03/02/2020 17:20    PMH:   Past Medical History:  Diagnosis Date  . Carotid artery disease ([place]) 1994   s/p left carotid endarerectomy   . Chronic atrial fibrillation (HCC)    a.  on coumadin   . Chronic diastolic CHF (congestive heart failure) ([place])   . Diabetes mellitus without complication ([place])    dx 2016  . Dyspnea   . Epistaxis   . Heart murmur   . Hx of CABG    a. 1994  . Hypertension   . OSA (obstructive sleep apnea)   . Rheumatic fever   . S/P AVR (aortic valve replacement)    a. mechanical valve 1996  . Subclavian bypass stenosis ([place])   . 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 Urinary catheter in place  12/2019  . Vitamin B12 deficiency     PSH:   Past Surgical History:  Procedure Laterality Date  . artificial valve    . [place]   replaced due to aortic stenosis, [employer] mechanical prostesis  . CAROTID ENDARTERECTOMY Left 1997   subclavian bypass Done in [place]  . CORONARY ARTERY BYPASS GRAFT  1994   w SVG to RCA and SVG to circumflex  . heart bypass     Done in [place]  . MULTIPLE EXTRACTIONS WITH ALVEOLOPLASTY Bilateral 12/12/2017   Procedure: Extraction of tooth #'s 1, 12,13,14,15,17,18,19, 29, and 30 with alveoloplasty and gross debridement of remaining teeth`;  Surgeon: Lenn Cal, DDS;  Location: [facility];  Service: Oral Surgery;  Laterality: Bilateral;  MULTIPLE EXTRACTION WITH ALVEOLOPLASTY WITH PRE PROSTHETIC SURGERY AND GROSS DEBRIDEMENT OF REMAINING TEETH  . RIGHT HEART CATH AND CORONARY/GRAFT ANGIOGRAPHY N/A 04/30/2018   Procedure: RIGHT HEART CATH AND CORONARY/GRAFT ANGIOGRAPHY;  Surgeon: Larey Dresser, MD;  Location: [facility];  Service: Cardiovascular;  Laterality: N/A;  . TEE WITHOUT CARDIOVERSION N/A 10/23/2017   Procedure: TRANSESOPHAGEAL ECHOCARDIOGRAM (TEE);  Surgeon: Larey Dresser, MD;  Location: [facility] ENDOSCOPY;  Service: Cardiovascular;  Laterality: N/A;  . TONSILLECTOMY      Allergies:  Allergies  Allergen Reactions  . Rifampin Rash and Other (See Comments)    May have been caused by Vancomycin or Rifampin (??)  .  Vancomycin Rash and Other (See Comments)    May have been caused by Vancomycin or Rifampin (??)    Medications:   Prior to Admission medications   Medication Sig Start Date End Date Taking? Authorizing Provider  acetaminophen (TYLENOL) 500 MG tablet Take 1,000 mg by mouth every 6 (six) hours as needed for moderate pain or headache.   Yes [provider]  albuterol (VENTOLIN HFA) 108 (90 Base) MCG/ACT inhaler INHALE 2 PUFFS INTO THE LUNGS EVERY 4 (FOUR) HOURS AS NEEDED FOR WHEEZING OR SHORTNESS OF BREATH. 02/25/20 02/24/21 Yes [name], [name], MD  allopurinol (ZYLOPRIM) 100 MG tablet TAKE 1 TABLET BY MOUTH EVERY DAY Patient taking differently: Take 100 mg by mouth daily.  09/02/19  Yes Larey Dresser, MD  amiodarone (PACERONE) 200 MG tablet TAKE 1 TABLET BY MOUTH EVERY DAY Patient taking differently: Take 200 mg by mouth daily.  03/01/20  Yes Clegg, Amy D, NP  carvedilol (COREG) 3.125 MG tablet Take 1 tablet (3.125 mg total) by mouth 2 (two) times daily. 08/04/19  Yes Larey Dresser, MD  cholecalciferol (VITAMIN D3) 25 MCG (1000 UNIT) tablet Take 1,000 Units by mouth at bedtime.   Yes [provider]  ezetimibe-simvastatin (VYTORIN) 10-20 MG tablet TAKE 1 TABLET BY MOUTH EVERYDAY AT BEDTIME Patient taking differently: Take 1 tablet by mouth at bedtime.  07/28/19  Yes [name], [name], MD  finasteride (PROSCAR) 5 MG tablet Take 5 mg by mouth at bedtime.    Yes [provider]  folic acid (FOLVITE) 732 MCG tablet Take 400 mcg by mouth at bedtime.   Yes [provider]  levothyroxine (SYNTHROID) 50 MCG tablet Take 1.5 tablets (75 mcg total) by mouth daily before breakfast. 10/14/19  Yes Larey Dresser, MD  LOKELMA 10 g PACK packet Take 1 packet by mouth See admin instructions. 1 time dose 02/24/20  Yes [provider]  metolazone (ZAROXOLYN) 5 MG tablet Take 1 tablet (5 mg total) by mouth 3 (three) times a week. Tuesdaym, Thursday and Saturday  02/09/20  Yes Larey Dresser, MD  polyethylene glycol powder (GLYCOLAX/MIRALAX) powder Take 17 g by mouth daily as needed for moderate constipation.   Yes [provider]  tamsulosin (FLOMAX) 0.4 MG CAPS capsule Take 0.4 mg by mouth daily after supper.    Yes [provider]  torsemide (DEMADEX) 100 MG tablet Take 1 tablet (100 mg total) by mouth 2 (two) times daily. 02/02/20  Yes Larey Dresser, MD  triamcinolone ointment (KENALOG) 0.1 % Apply 1 application topically 2 (two) times daily. Patient taking differently: Apply 1 application topically daily as needed (rash).  12/25/18  Yes [name], [name], MD  vitamin B-12 (CYANOCOBALAMIN) 1000 MCG tablet Take 1,000 mcg by mouth at bedtime.   Yes [provider]  Vitamins A & D (VITAMIN A & D) ointment Apply 1 application topically as needed (blisters).   Yes [provider]  warfarin (COUMADIN) 5 MG tablet TAKE  AS DIRECTED BY COUMADIN CLINIC Patient taking differently: Take 5 mg by mouth See admin instructions. 1 tablet(5mg )on Saturday and Thursday  1/2 tablet(2.5mg ) Monday,tuesday,wednesday,friday,sunday 10/01/19  Yes Belva Crome, MD    Discontinued Meds:  There are no discontinued medications.  Social History:  reports that he quit smoking about 27 years ago. His smoking use included cigarettes. He has a 30.00 pack-year smoking history. He has never used smokeless tobacco. He reports current alcohol use. He reports that he does not use drugs.  Family History:   Family History  Problem Relation Age of Onset  . Cancer Father        lung   . Hypertension Mother     Blood pressure (!) 97/43, pulse (!) 48, temperature (!) 97.3 F (36.3 C), temperature source Oral, resp. rate 12, SpO2 97 %. General appearance: alert, cooperative and appears stated age Head: Normocephalic, without obvious abnormality, atraumatic Eyes: pallor Neck: no adenopathy, no carotid bruit, supple, symmetrical, trachea midline,  thyroid not enlarged, symmetric, no tenderness/mass/nodules and JVD present 12 cm Back: negative, symmetric, no curvature. ROM normal. No CVA tenderness. Resp: rales, and decr BS left Chest wall: no tenderness Cardio: irregular rate and rhythm GI: ascites? tympanic Extremities: edema 3+ Pulses: 2+ and symmetric Skin: Skin color, texture, turgor normal. No rashes or lesions Lymph nodes: Cervical adenopathy: none       Katie Moch, Hunt Oris, MD 03/02/2020, 8:02 PM

## 2020-03-02 NOTE — ED Provider Notes (Addendum)
[facility] EMERGENCY DEPARTMENT Provider Note   CSN: [id number] Arrival date & time: 03/02/20  1458     History Chief Complaint  Patient presents with  . Abnormal Lab  . Leg Swelling    [name] is a 84 y.o. male.  Pt is an 84y/o male with hx of mechanical AVR, OSA on CPAP, Chronic afib, CHF, CKD, chronic coumadin therapy, CAD s/p CAGB 1994, DM and COPD who has been having worsening swelling, weight gain and mobility over the last 2 months.  Patient reports that [name] is receiving home care and gets furosemide 80 mg IV 2 times a week and continues to take metolazone and torsemide.  [name] had Unna boots placed around Thanksgiving and the swelling in his lower extremities has improved but the swelling in his abdomen and thighs has worsened.  [name] is up approximately 60 pounds from his baseline.  [name] is having progressive shortness of breath and now with any exertion his oxygen saturation drops to the 80s.  [name] is approved for home oxygen but has not received it yet.  [name] has orthopnea as well but with sitting in the bed [name] denies feeling short of breath.  [name] has been using his inhaler at home with minimal improvement.  [name] is also still urinating approximately the same every few hours.  [name] has no nausea, vomiting, chest pain, abdominal pain or cough.  [name] reports for the last 2 days [name] has been extremely weak and having difficulty even standing.  [name] did have labs drawn yesterday which showed worsening hyponatremia, worsening renal function and a therapeutic INR.  [name] spoke with cardiology today who did not feel that [name] would have any more results of being diuresed at home and may even need dialysis and requested that [name] come to the hospital.  The history is provided by the patient, the spouse and medical records.  Abnormal Lab      Past Medical History:  Diagnosis Date  . Carotid artery disease ([place]) 1994   s/p left carotid endarerectomy   . Chronic atrial fibrillation (HCC)     a. on coumadin   . Chronic diastolic CHF (congestive heart failure) ([place])   . Diabetes mellitus without complication ([place])    dx 2016  . Dyspnea   . Epistaxis   . Heart murmur   . Hx of CABG    a. 1994  . Hypertension   . OSA (obstructive sleep apnea)   . Rheumatic fever   . S/P AVR (aortic valve replacement)    a. mechanical valve 1996  . Subclavian bypass stenosis ([place])   . 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 Urinary catheter in place 12/2019  . Vitamin B12 deficiency     Patient Active Problem List   Diagnosis Date Noted  . Skin cancer 06/23/2019  . Bruising 03/25/2019  . Hypothyroidism 09/24/2018  . Acute on chronic renal failure ([place]) 01/31/2018  . Debility 01/18/2018  . Anemia 01/09/2018  . Foot pain, left 01/07/2018  . Dysuria 01/07/2018  . Severe anemia 12/23/2017  . Hyponatremia 12/23/2017  . Coronary artery disease with history of myocardial infarction without history of CABG 12/23/2017  . Bleeding from mouth 12/20/2017  . Post-op pain 12/12/2017  . Dental caries 12/12/2017  . Severe mitral regurgitation 11/14/2017  . Pressure injury of skin 10/29/2017  . Shortness of breath   . Palliative care by specialist   . RVF (  right ventricular failure) ([place])   . Right heart failure ([place]) 10/18/2017  . Hypoxia   . COPD exacerbation ([place]) 08/01/2017  . Pancytopenia ([place]) 08/01/2017  . Anticoagulation goal of INR 1.5 to 2.5 07/16/2017  . Epistaxis 07/11/2017  . Actinic keratoses 04/05/2017  . Leg wound, right 02/18/2016  . Peripheral edema 02/18/2016  . Chronic anticoagulation 02/18/2016  . Hyperkalemia 07/25/2015  . Atherosclerosis of native arteries of extremity with intermittent claudication ([place]) 10/21/2014  . Thrombocytopenia ([place]) 10/20/2014  . Macrocytosis without anemia 10/20/2014  . OSA (obstructive sleep apnea) 05/15/2013  . Goals of care, counseling/discussion 05/01/2013  . History of  mechanical aortic valve replacement 01/02/2013  . Obesity (BMI 30-39.9) 10/20/2012  . Type II diabetes mellitus with complication ([place]) 02/58/5277  . Acute asthmatic bronchitis 01/18/2012  . ACUTE KIDNEY FAILURE UNSPECIFIED 02/04/2009  . CUTANEOUS ERUPTIONS, DRUG-INDUCED 02/04/2009  . HLD (hyperlipidemia) 02/03/2009  . Essential hypertension 02/03/2009  . Coronary atherosclerosis 02/03/2009  . Atrial fibrillation ([place]) 02/03/2009  . Chronic diastolic heart failure ([place]) 02/03/2009  . CAROTID ENDARTERECTOMY, LEFT, HX OF 02/03/2009  . INF&INFLAM REACT DUE CARD DEVICE IMPLANT&GRAFT 12/30/2008    Past Surgical History:  Procedure Laterality Date  . artificial valve    . [place]   replaced due to aortic stenosis, [employer] mechanical prostesis  . CAROTID ENDARTERECTOMY Left 1997   subclavian bypass Done in [place]  . CORONARY ARTERY BYPASS GRAFT  1994   w SVG to RCA and SVG to circumflex  . heart bypass     Done in [place]  . MULTIPLE EXTRACTIONS WITH ALVEOLOPLASTY Bilateral 12/12/2017   Procedure: Extraction of tooth #'s 1, 12,13,14,15,17,18,19, 29, and 30 with alveoloplasty and gross debridement of remaining teeth`;  Surgeon: Lenn Cal, DDS;  Location: [facility];  Service: Oral Surgery;  Laterality: Bilateral;  MULTIPLE EXTRACTION WITH ALVEOLOPLASTY WITH PRE PROSTHETIC SURGERY AND GROSS DEBRIDEMENT OF REMAINING TEETH  . RIGHT HEART CATH AND CORONARY/GRAFT ANGIOGRAPHY N/A 04/30/2018   Procedure: RIGHT HEART CATH AND CORONARY/GRAFT ANGIOGRAPHY;  Surgeon: Larey Dresser, MD;  Location: [facility];  Service: Cardiovascular;  Laterality: N/A;  . TEE WITHOUT CARDIOVERSION N/A 10/23/2017   Procedure: TRANSESOPHAGEAL ECHOCARDIOGRAM (TEE);  Surgeon: Larey Dresser, MD;  Location: [facility] ENDOSCOPY;  Service: Cardiovascular;  Laterality: N/A;  . TONSILLECTOMY         Family History  Problem Relation Age of Onset  . Cancer Father        lung   . Hypertension  Mother     Social History   Tobacco Use  . Smoking status: Former Smoker    Packs/day: 1.00    Years: 30.00    Pack years: 30.00    Types: Cigarettes    Quit date: 04/03/1992    Years since quitting: 27.9  . Smokeless tobacco: Never Used  Vaping Use  . Vaping Use: Never used  Substance Use Topics  . Alcohol use: Yes    Comment: 4-6 ounces of wine daily  . Drug use: No    Comment: Half a cup a day.    Home Medications Prior to Admission medications   Medication Sig Start Date End Date Taking? Authorizing Provider  acetaminophen (TYLENOL) 500 MG tablet Take 1,000 mg by mouth every 6 (six) hours as needed for moderate pain or headache.    [provider]  albuterol (VENTOLIN HFA) 108 (90 Base) MCG/ACT inhaler INHALE 2 PUFFS INTO THE LUNGS EVERY 4 (FOUR) HOURS AS NEEDED  FOR WHEEZING OR SHORTNESS OF BREATH. 02/25/20 02/24/21  [name], [name], MD  allopurinol (ZYLOPRIM) 100 MG tablet TAKE 1 TABLET BY MOUTH EVERY DAY 09/02/19   Larey Dresser, MD  amiodarone (PACERONE) 200 MG tablet TAKE 1 TABLET BY MOUTH EVERY DAY 03/01/20   Clegg, Amy D, NP  carvedilol (COREG) 3.125 MG tablet Take 1 tablet (3.125 mg total) by mouth 2 (two) times daily. 08/04/19   Larey Dresser, MD  ezetimibe-simvastatin (VYTORIN) 10-20 MG tablet TAKE 1 TABLET BY MOUTH EVERYDAY AT BEDTIME 07/28/19   [name], [name], MD  finasteride (PROSCAR) 5 MG tablet Take 5 mg by mouth daily.    [provider]  levothyroxine (SYNTHROID) 50 MCG tablet Take 1.5 tablets (75 mcg total) by mouth daily before breakfast. 10/14/19   Larey Dresser, MD  metolazone (ZAROXOLYN) 5 MG tablet Take 1 tablet (5 mg total) by mouth 3 (three) times a week. Tuesdaym, Thursday and Saturday 02/09/20   Larey Dresser, MD  polyethylene glycol powder (GLYCOLAX/MIRALAX) powder Take 17 g by mouth daily as needed for moderate constipation.    [provider]  tamsulosin (FLOMAX) 0.4 MG CAPS capsule Take 0.4 mg by mouth  daily after supper.     [provider]  torsemide (DEMADEX) 100 MG tablet Take 1 tablet (100 mg total) by mouth 2 (two) times daily. 02/02/20   Larey Dresser, MD  triamcinolone ointment (KENALOG) 0.1 % Apply 1 application topically 2 (two) times daily. Patient taking differently: Apply 1 application topically daily as needed.  12/25/18   [name], [name], MD  warfarin (COUMADIN) 5 MG tablet TAKE  AS DIRECTED BY COUMADIN CLINIC 10/01/19   Belva Crome, MD    Allergies    Rifampin and Vancomycin  Review of Systems   Review of Systems  All other systems reviewed and are negative.   Physical Exam Updated Vital Signs BP (!) 82/48   Pulse (!) 52   Temp (!) 97.3 F (36.3 C) (Oral)   Resp 20   SpO2 91%   Physical Exam Vitals and nursing note reviewed.  Constitutional:      General: [name] is not in acute distress.    Appearance: [name] is well-developed. [name] is obese. [name] is ill-appearing.  HENT:     Head: Normocephalic and atraumatic.  Eyes:     Conjunctiva/sclera: Conjunctivae normal.     Pupils: Pupils are equal, round, and reactive to light.  Cardiovascular:     Rate and Rhythm: Bradycardia present. Rhythm irregularly irregular.     Heart sounds: Murmur heard.  Systolic murmur is present with a grade of 3/6.   Pulmonary:     Effort: Pulmonary effort is normal. Tachypnea present. No respiratory distress.     Breath sounds: Wheezing and rales present.  Abdominal:     General: There is no distension.     Palpations: Abdomen is soft.     Tenderness: There is no abdominal tenderness. There is no guarding or rebound.  Musculoskeletal:        General: Swelling present. No tenderness. Normal range of motion.     Cervical back: Normal range of motion and neck supple.     Right lower leg: Edema present.     Left lower leg: Edema present.     Comments: Anasarca with tense edema in the abdomen and pitting edema from the thighs down to the Unna boots which are present on both  feet  Skin:    General:  Skin is warm and dry.     Findings: Bruising present. No erythema or rash.     Comments: Extensive bruising on bilateral upper extremities.  Petechia over the upper body and face  Neurological:     General: No focal deficit present.     Mental Status: [name] is alert and oriented to person, place, and time. Mental status is at baseline.  Psychiatric:        Mood and Affect: Mood normal.        Behavior: Behavior normal.        Thought Content: Thought content normal.     ED Results / Procedures / Treatments   Labs (all labs ordered are listed, but only abnormal results are displayed) Labs Reviewed  COMPREHENSIVE METABOLIC PANEL - Abnormal; Notable for the following components:      Result Value   Sodium 124 (*)    Chloride 85 (*)    Glucose, Bld 118 (*)    BUN 116 (*)    Creatinine, Ser 3.29 (*)    Calcium 8.6 (*)    Total Protein 6.4 (*)    Albumin 3.2 (*)    Alkaline Phosphatase 136 (*)    GFR, Estimated 18 (*)    All other components within normal limits  CBC WITH DIFFERENTIAL/PLATELET - Abnormal; Notable for the following components:   RBC 2.54 (*)    Hemoglobin 8.3 (*)    HCT 26.2 (*)    MCV 103.1 (*)    RDW 18.2 (*)    Platelets 78 (*)    Lymphs Abs 0.5 (*)    All other components within normal limits  URINALYSIS, ROUTINE W REFLEX MICROSCOPIC - Abnormal; Notable for the following components:   Hgb urine dipstick MODERATE (*)    Leukocytes,Ua LARGE (*)    Bacteria, UA FEW (*)    All other components within normal limits  PROTIME-INR - Abnormal; Notable for the following components:   Prothrombin Time 28.0 (*)    INR 2.7 (*)    All other components within normal limits    EKG EKG Interpretation  Date/Time:  Tuesday March 02 2020 15:52:42 EST Ventricular Rate:  47 PR Interval:    QRS Duration: 144 QT Interval:  501 QTC Calculation: 443 R Axis:   84 Text Interpretation: Bradycardia with irregular rate Prolonged PR interval  Nonspecific intraventricular conduction delay Borderline repolarization abnormality No significant change since last tracing Confirmed by Blanchie Dessert [phone]) on 03/02/2020 4:00:44 PM   Radiology DG Chest Port 1 View  Result Date: 03/02/2020 CLINICAL DATA:  Hyponatremia, lower extremity edema, decreased renal function, short of breath EXAM: PORTABLE CHEST 1 VIEW COMPARISON:  01/09/2018 FINDINGS: Single frontal view of the chest demonstrates an enlarged cardiac silhouette. There is chronic central vascular congestion, with diffuse interstitial prominence likely reflecting an element of interstitial edema. There is also left basilar consolidation and small left pleural effusion, favor asymmetric edema over infection. No pneumothorax. No acute bony abnormalities. IMPRESSION: 1. Findings most consistent with congestive heart failure. Electronically Signed   By: Randa Ngo M.D.   On: 03/02/2020 17:20    Procedures Procedures (including critical care time)  Medications Ordered in ED Medications - No data to display  ED Course  I have reviewed the triage vital signs and the nursing notes.  Pertinent labs & imaging results that were available during my care of the patient were reviewed by me and considered in my medical decision making (see chart for details).  MDM Rules/Calculators/A&P                          Patient is an 84 year old gentleman with a complex past medical history with worsening renal function, heart failure and fluid overload.  Patient has been taking multiple diuretics at home and getting IV Lasix several times a week but continues to decline.  [name] is now up at least 60 pounds from baseline and is now starting to feel extremely weak having difficulty standing and extreme dyspnea with any activity.  Patient had labs done yesterday that showed worsening renal function, hyponatremia down to 122 and cardiology spoke with him today and recommended that [name] come to the  hospital as they did not feel that [name] would be able to be diuresed as an outpatient.  Spoke with Dr. [name] on the phone who recommended repeat labs today and getting nephrology involved as [name] feels the patient will most likely need dialysis to be able to be diuresed effectively.  Also [name] wanted to wait until labs returned before any recommendations were given for diuresis.  Patient denies any infectious symptoms.  Neurologically [name] is intact.  Patient is slightly tachypneic at rest but sats are 99% on room air and [name] does not feel short of breath at this time.  There are some mild wheezing and rales on exam and anasarca present.  EKG is unchanged with bradycardia which appears to be atrial fibrillation.  5:56 PM Chest x-ray consistent with CHF, CMP grossly unchanged from yesterday with persistent hyponatremia of 124, elevated creatinine of 3.29 and BUN of 116.  Platelet count with persistent thrombocytopenia at 78 and stable hemoglobin in the range of 8.  INR is 2.7.  Will consult nephrology, cardiology for recommendations on diuresis and admission to medicine.  Spoke with nephrology and they reported that it was fine to give it IV dose of Lasix.  They will consult on the patient.  CRITICAL CARE Performed by: Marialy Urbanczyk Total critical care time: 30 minutes Critical care time was exclusive of separately billable procedures and treating other patients. Critical care was necessary to treat or prevent imminent or life-threatening deterioration. Critical care was time spent personally by me on the following activities: development of treatment plan with patient and/or surrogate as well as nursing, discussions with consultants, evaluation of patient's response to treatment, examination of patient, obtaining history from patient or surrogate, ordering and performing treatments and interventions, ordering and review of laboratory studies, ordering and review of radiographic studies, pulse oximetry and  re-evaluation of patient's condition.  MDM Number of Diagnoses or Management Options   Amount and/or Complexity of Data Reviewed Clinical lab tests: ordered and reviewed Tests in the radiology section of CPT: ordered and reviewed Tests in the medicine section of CPT: ordered and reviewed Decide to obtain previous medical records or to obtain history from someone other than the patient: yes Obtain history from someone other than the patient: yes Review and summarize past medical records: yes Discuss the patient with other providers: yes Independent visualization of images, tracings, or specimens: yes  Risk of Complications, Morbidity, and/or Mortality Presenting problems: high Diagnostic procedures: high Management options: high  Patient Progress Patient progress: stable   Final Clinical Impression(s) / ED Diagnoses Final diagnoses:  Acute on chronic congestive heart failure, unspecified heart failure type ([place])  AKI (acute kidney injury) ([place])  Hepatorenal syndrome ([place])    Rx / DC Orders ED Discharge Orders  None       Blanchie Dessert, MD 03/02/20 Tamela Oddi, MD 03/02/20 [phone]

## 2020-03-02 NOTE — ED Triage Notes (Signed)
Pt arrives via [facility] from home for increased extremity edema, low sodium, increased INR, and decreased kidney function.

## 2020-03-02 NOTE — H&P (Addendum)
History and Physical    [name] JJH:[id number] DOB: 30-Jan-1936 DOA: 03/02/2020  PCP: Cassandria Anger, MD  Patient coming from: Home.  Chief Complaint: Worsening shortness of breath increasing peripheral edema abnormal labs.  HPI: [name] is a 84 y.o. male with history of chronic combined CHF and severe mitral regurgitation with history of mechanical aortic valve replacement, atrial fibrillation, CAD status post CABG, chronic kidney disease stage IV baseline creatinine around 2.5-3 with history of ITP anemia right lower extremity wound has been experiencing worsening shortness of breath increasing peripheral edema over the last 2 months with chronic indwelling Foley catheter.  Patient has been on trazodone torsemide despite which patient has been getting more weight.  Patient renal function also has been progressively worsening.  Patient presents to the ER.  ED Course: In the ER chest x-ray shows features concerning for CHF on exam patient has significant lower extremity edema and abdominal distention.  EKG shows sinus bradycardia.  Heart rate is around 44 bpm.  Appears to be regular.  Labs are significant for creatinine of 3.2 potassium 3.9 and sodium 124.  Hemoglobin 8.3 platelets 78 Covid test negative.  On-call cardiologist was consulted requested nephrology input and Dr. Augustin Coupe from nephrology evaluated the patient and placed patient on 80 mg IV Lasix and plan is to have dialysis in the morning.  Review of Systems: As per HPI, rest all negative.   Past Medical History:  Diagnosis Date  . Carotid artery disease ([place]) 1994   s/p left carotid endarerectomy   . Chronic atrial fibrillation (HCC)    a. on coumadin   . Chronic diastolic CHF (congestive heart failure) ([place])   . Diabetes mellitus without complication ([place])    dx 2016  . Dyspnea   . Epistaxis   . Heart murmur   . Hx of CABG    a. 1994  . Hypertension   . OSA (obstructive sleep apnea)   . Rheumatic fever   .  S/P AVR (aortic valve replacement)    a. mechanical valve 1996  . Subclavian bypass stenosis ([place])   . 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 Urinary catheter in place 12/2019  . Vitamin B12 deficiency     Past Surgical History:  Procedure Laterality Date  . artificial valve    . [place]   replaced due to aortic stenosis, [employer] mechanical prostesis  . CAROTID ENDARTERECTOMY Left 1997   subclavian bypass Done in [place]  . CORONARY ARTERY BYPASS GRAFT  1994   w SVG to RCA and SVG to circumflex  . heart bypass     Done in [place]  . MULTIPLE EXTRACTIONS WITH ALVEOLOPLASTY Bilateral 12/12/2017   Procedure: Extraction of tooth #'s 1, 12,13,14,15,17,18,19, 29, and 30 with alveoloplasty and gross debridement of remaining teeth`;  Surgeon: Lenn Cal, DDS;  Location: [facility];  Service: Oral Surgery;  Laterality: Bilateral;  MULTIPLE EXTRACTION WITH ALVEOLOPLASTY WITH PRE PROSTHETIC SURGERY AND GROSS DEBRIDEMENT OF REMAINING TEETH  . RIGHT HEART CATH AND CORONARY/GRAFT ANGIOGRAPHY N/A 04/30/2018   Procedure: RIGHT HEART CATH AND CORONARY/GRAFT ANGIOGRAPHY;  Surgeon: Larey Dresser, MD;  Location: [facility];  Service: Cardiovascular;  Laterality: N/A;  . TEE WITHOUT CARDIOVERSION N/A 10/23/2017   Procedure: TRANSESOPHAGEAL ECHOCARDIOGRAM (TEE);  Surgeon: Larey Dresser, MD;  Location: [facility];  Service: Cardiovascular;  Laterality: N/A;  . TONSILLECTOMY  reports that he quit smoking about 27 years ago. His smoking use included cigarettes. He has a 30.00 pack-year smoking history. He has never used smokeless tobacco. He reports current alcohol use. He reports that he does not use drugs.  Allergies  Allergen Reactions  . Rifampin Rash and Other (See Comments)    May have been caused by Vancomycin or Rifampin (??)  . Vancomycin Rash and Other (See Comments)    May have been caused by  Vancomycin or Rifampin (??)    Family History  Problem Relation Age of Onset  . Cancer Father        lung   . Hypertension Mother     Prior to Admission medications   Medication Sig Start Date End Date Taking? Authorizing Provider  acetaminophen (TYLENOL) 500 MG tablet Take 1,000 mg by mouth every 6 (six) hours as needed for moderate pain or headache.   Yes [provider]  albuterol (VENTOLIN HFA) 108 (90 Base) MCG/ACT inhaler INHALE 2 PUFFS INTO THE LUNGS EVERY 4 (FOUR) HOURS AS NEEDED FOR WHEEZING OR SHORTNESS OF BREATH. 02/25/20 02/24/21 Yes [name], [name], MD  allopurinol (ZYLOPRIM) 100 MG tablet TAKE 1 TABLET BY MOUTH EVERY DAY Patient taking differently: Take 100 mg by mouth daily.  09/02/19  Yes Larey Dresser, MD  amiodarone (PACERONE) 200 MG tablet TAKE 1 TABLET BY MOUTH EVERY DAY Patient taking differently: Take 200 mg by mouth daily.  03/01/20  Yes Clegg, Amy D, NP  carvedilol (COREG) 3.125 MG tablet Take 1 tablet (3.125 mg total) by mouth 2 (two) times daily. 08/04/19  Yes Larey Dresser, MD  cholecalciferol (VITAMIN D3) 25 MCG (1000 UNIT) tablet Take 1,000 Units by mouth at bedtime.   Yes [provider]  ezetimibe-simvastatin (VYTORIN) 10-20 MG tablet TAKE 1 TABLET BY MOUTH EVERYDAY AT BEDTIME Patient taking differently: Take 1 tablet by mouth at bedtime.  07/28/19  Yes [name], [name], MD  finasteride (PROSCAR) 5 MG tablet Take 5 mg by mouth at bedtime.    Yes [provider]  folic acid (FOLVITE) 161 MCG tablet Take 400 mcg by mouth at bedtime.   Yes [provider]  levothyroxine (SYNTHROID) 50 MCG tablet Take 1.5 tablets (75 mcg total) by mouth daily before breakfast. 10/14/19  Yes Larey Dresser, MD  LOKELMA 10 g PACK packet Take 1 packet by mouth See admin instructions. 1 time dose 02/24/20  Yes [provider]  metolazone (ZAROXOLYN) 5 MG tablet Take 1 tablet (5 mg total) by mouth 3 (three) times a week. Tuesdaym,  Thursday and Saturday 02/09/20  Yes Larey Dresser, MD  polyethylene glycol powder (GLYCOLAX/MIRALAX) powder Take 17 g by mouth daily as needed for moderate constipation.   Yes [provider]  tamsulosin (FLOMAX) 0.4 MG CAPS capsule Take 0.4 mg by mouth daily after supper.    Yes [provider]  torsemide (DEMADEX) 100 MG tablet Take 1 tablet (100 mg total) by mouth 2 (two) times daily. 02/02/20  Yes Larey Dresser, MD  triamcinolone ointment (KENALOG) 0.1 % Apply 1 application topically 2 (two) times daily. Patient taking differently: Apply 1 application topically daily as needed (rash).  12/25/18  Yes [name], [name], MD  vitamin B-12 (CYANOCOBALAMIN) 1000 MCG tablet Take 1,000 mcg by mouth at bedtime.   Yes [provider]  Vitamins A & D (VITAMIN A & D) ointment Apply 1 application topically as needed (blisters).   Yes [provider]  warfarin (  COUMADIN) 5 MG tablet TAKE  AS DIRECTED BY COUMADIN CLINIC Patient taking differently: Take 5 mg by mouth See admin instructions. 1 tablet(5mg )on Saturday and Thursday  1/2 tablet(2.5mg ) Monday,tuesday,wednesday,friday,sunday 10/01/19  Yes [place]va Crome, MD    Physical Exam: Constitutional: Moderately built and nourished. Vitals:   03/02/20 1800 03/02/20 1830 03/02/20 2000 03/02/20 2130  BP: (!) 101/48 (!) 97/43 (!) 108/48 (!) 92/51  Pulse: (!) 53 (!) 48 (!) 48 (!) 49  Resp: 13 12 12 15   Temp:      TempSrc:      SpO2: 98% 97% 100% 95%   Eyes: Anicteric no pallor. ENMT: No discharge from the ears eyes nose or mouth. Neck: No mass felt.  No neck rigidity and no JVD appreciated. Respiratory: No rhonchi or crepitations. Cardiovascular: S1-S2 heard. Abdomen: Distended nontender bowel sounds present. Musculoskeletal: Bilateral lower extremity edema present.  Right lower extremity has dressing from previous wound. Skin: Right lower extremity wound. Neurologic: Alert awake oriented to time place and  person.  Moves all extremities. Psychiatric: Appears normal.  Normal affect.   Labs on Admission: I have personally reviewed following labs and imaging studies  CBC: Recent Labs  Lab 03/02/20 1510  WBC 4.4  NEUTROABS 3.5  HGB 8.3*  HCT 26.2*  MCV 103.1*  PLT 78*   Basic Metabolic Panel: Recent Labs  Lab 02/25/20 1055 03/01/20 1051 03/02/20 1510  NA 124* 122* 124*  K 3.9 3.8 3.9  CL 89* 86* 85*  CO2 22 23 24   GLUCOSE 147* 115* 118*  BUN 104* 111* 116*  CREATININE 3.23* 3.32* 3.29*  CALCIUM 8.3* 8.3* 8.6*   GFR: Estimated Creatinine Clearance: 24.1 mL/min (A) (by C-G formula based on SCr of 3.29 mg/dL (H)). Liver Function Tests: Recent Labs  Lab 03/02/20 1510  AST 28  ALT 20  ALKPHOS 136*  BILITOT 1.1  PROT 6.4*  ALBUMIN 3.2*   No results for input(s): LIPASE, AMYLASE in the last 168 hours. No results for input(s): AMMONIA in the last 168 hours. Coagulation Profile: Recent Labs  Lab 03/01/20 1051 03/02/20 1510  INR 3.2* 2.7*   Cardiac Enzymes: No results for input(s): CKTOTAL, CKMB, CKMBINDEX, TROPONINI in the last 168 hours. BNP (last 3 results) No results for input(s): PROBNP in the last 8760 hours. HbA1C: No results for input(s): HGBA1C in the last 72 hours. CBG: No results for input(s): GLUCAP in the last 168 hours. Lipid Profile: No results for input(s): CHOL, HDL, LDLCALC, TRIG, CHOLHDL, LDLDIRECT in the last 72 hours. Thyroid Function Tests: No results for input(s): TSH, T4TOTAL, FREET4, T3FREE, THYROIDAB in the last 72 hours. Anemia Panel: No results for input(s): VITAMINB12, FOLATE, FERRITIN, TIBC, IRON, RETICCTPCT in the last 72 hours. Urine analysis:    Component Value Date/Time   COLORURINE YELLOW 03/02/2020 1552   APPEARANCEUR CLEAR 03/02/2020 1552   LABSPEC 1.008 03/02/2020 1552   PHURINE 6.0 03/02/2020 1552   GLUCOSEU NEGATIVE 03/02/2020 1552   GLUCOSEU NEGATIVE 09/24/2018 0929   HGBUR MODERATE (A) 03/02/2020 1552    BILIRUBINUR NEGATIVE 03/02/2020 1552   BILIRUBINUR Neg 08/14/2014 1051   KETONESUR NEGATIVE 03/02/2020 1552   PROTEINUR NEGATIVE 03/02/2020 1552   UROBILINOGEN 0.2 09/24/2018 0929   NITRITE NEGATIVE 03/02/2020 1552   LEUKOCYTESUR LARGE (A) 03/02/2020 1552   Sepsis Labs: @LABRCNTIP (procalcitonin:4,lacticidven:4) ) Recent Results (from the past 240 hour(s))  Resp Panel by RT-PCR (Flu A&B, Covid) Nasopharyngeal Swab     Status: None   Collection Time: 03/02/20  8:08 PM  Specimen: Nasopharyngeal Swab; Nasopharyngeal(NP) swabs in vial transport medium  Result Value Ref Range Status   SARS Coronavirus 2 by RT PCR NEGATIVE NEGATIVE Final    Comment: (NOTE) SARS-CoV-2 target nucleic acids are NOT DETECTED.  The SARS-CoV-2 RNA is generally detectable in upper respiratory specimens during the acute phase of infection. The lowest concentration of SARS-CoV-2 viral copies this assay can detect is 138 copies/mL. A negative result does not preclude SARS-Cov-2 infection and should not be used as the sole basis for treatment or other patient management decisions. A negative result may occur with  improper specimen collection/handling, submission of specimen other than nasopharyngeal swab, presence of viral mutation(s) within the areas targeted by this assay, and inadequate number of viral copies(<138 copies/mL). A negative result must be combined with clinical observations, patient history, and epidemiological information. The expected result is Negative.  Fact Sheet for Patients:  EntrepreneurPulse.com.au  Fact Sheet for Healthcare Providers:  IncredibleEmployment.be  This test is no t yet approved or cleared by the [place] FDA and  has been authorized for detection and/or diagnosis of SARS-CoV-2 by FDA under an Emergency Use Authorization (EUA). This EUA will remain  in effect (meaning this test can be used) for the duration of the COVID-19  declaration under Section 564(b)(1) of the Act, 21 U.S.C.section 360bbb-3(b)(1), unless the authorization is terminated  or revoked sooner.       Influenza A by PCR NEGATIVE NEGATIVE Final   Influenza B by PCR NEGATIVE NEGATIVE Final    Comment: (NOTE) The Xpert Xpress SARS-CoV-2/FLU/RSV plus assay is intended as an aid in the diagnosis of influenza from Nasopharyngeal swab specimens and should not be used as a sole basis for treatment. Nasal washings and aspirates are unacceptable for Xpert Xpress SARS-CoV-2/FLU/RSV testing.  Fact Sheet for Patients: EntrepreneurPulse.com.au  Fact Sheet for Healthcare Providers: IncredibleEmployment.be  This test is not yet approved or cleared by the [place] FDA and has been authorized for detection and/or diagnosis of SARS-CoV-2 by FDA under an Emergency Use Authorization (EUA). This EUA will remain in effect (meaning this test can be used) for the duration of the COVID-19 declaration under Section 564(b)(1) of the Act, 21 U.S.C. section 360bbb-3(b)(1), unless the authorization is terminated or revoked.  Performed at [facility], [place] [address] [place], [place] [address]      Radiological Exams on Admission: DG Chest Port 1 View  Result Date: 03/02/2020 CLINICAL DATA:  Hyponatremia, lower extremity edema, decreased renal function, short of breath EXAM: PORTABLE CHEST 1 VIEW COMPARISON:  01/09/2018 FINDINGS: Single frontal view of the chest demonstrates an enlarged cardiac silhouette. There is chronic central vascular congestion, with diffuse interstitial prominence likely reflecting an element of interstitial edema. There is also left basilar consolidation and small left pleural effusion, favor asymmetric edema over infection. No pneumothorax. No acute bony abnormalities. IMPRESSION: 1. Findings most consistent with congestive heart failure. Electronically Signed   By: Randa Ngo M.D.   On:  03/02/2020 17:20    EKG: Independently reviewed.  Sinus bradycardia.  Assessment/Plan Principal Problem:   Anasarca Active Problems:   Essential hypertension   Atrial fibrillation (HCC)   Chronic diastolic heart failure (HCC)   History of mechanical aortic valve replacement   OSA (obstructive sleep apnea)   Thrombocytopenia (HCC)   Macrocytosis without anemia   Severe mitral regurgitation   Acute on chronic renal failure (HCC)   Hypothyroidism    1. Anasarca likely from acute on chronic combined systolic and diastolic  heart failure with progressive renal disease with history of severe mitral regurgitation -last EF measured was around 50 to 55% in April 2021.  Appreciate nephrology input patient has been placed on Lasix 80 mg IV plan to have dialysis in the morning.  Follow intake output metabolic panel daily weights and respiratory status. 2. Acute on chronic kidney disease stage IV see #1.  Appreciate nephrology input. 3. Bradycardia -will hold off patient's Coreg and amiodarone for now discussed with cardiologist.  Closely monitor in telemetry.  Check TSH and cardiac markers 4. History of sleep apnea on CPAP at bedtime. 5. COPD not actively wheezing. 6. Chronic thrombocytopenia and anemia with history of ITP follow CBC. 7. Hypothyroidism on Synthroid check TSH. 8. Morbid obesity will need counseling. 9. Chronic right lower extremity wound consult wound team. 10. Mechanical aortic valve on Coumadin.  Recent 2D echo was showing normal functioning aortic valve. 11. History of CAD status post CABG. 12. Chronic indwelling Foley catheter with urine showing possible UTI.  We will get urine cultures until then we will keep patient on cefepime.  Given that patient has progressive renal failure with anasarca likely needing dialysis will need close monitoring for any further worsening in inpatient status.   DVT prophylaxis: Coumadin. Code Status: DNR as confirmed with patient. Family  Communication: Discussed with patient. Disposition Plan: To be determined. Consults called: Nephrology and cardiology. Admission status: Inpatient.   Rise Patience MD [facility] Pager [phone].  If 7PM-7AM, please contact night-coverage [email or web address] Password [email or web address]  03/02/2020, 10:01 PM

## 2020-03-03 ENCOUNTER — Encounter (HOSPITAL_BASED_OUTPATIENT_CLINIC_OR_DEPARTMENT_OTHER): Payer: Medicare Other | Admitting: Physician Assistant

## 2020-03-03 ENCOUNTER — Inpatient Hospital Stay (HOSPITAL_COMMUNITY): Payer: Medicare Other

## 2020-03-03 ENCOUNTER — Telehealth: Payer: Self-pay

## 2020-03-03 DIAGNOSIS — N179 Acute kidney failure, unspecified: Secondary | ICD-10-CM

## 2020-03-03 DIAGNOSIS — I5033 Acute on chronic diastolic (congestive) heart failure: Secondary | ICD-10-CM

## 2020-03-03 DIAGNOSIS — R601 Generalized edema: Secondary | ICD-10-CM

## 2020-03-03 HISTORY — PX: IR US GUIDE VASC ACCESS RIGHT: IMG2390

## 2020-03-03 HISTORY — PX: IR FLUORO GUIDE CV LINE RIGHT: IMG2283

## 2020-03-03 LAB — COMPREHENSIVE METABOLIC PANEL
ALT: 18 U/L (ref 0–44)
AST: 24 U/L (ref 15–41)
Albumin: 3.1 g/dL — ABNORMAL LOW (ref 3.5–5.0)
Alkaline Phosphatase: 118 U/L (ref 38–126)
Anion gap: 15 (ref 5–15)
BUN: 114 mg/dL — ABNORMAL HIGH (ref 8–23)
CO2: 24 mmol/L (ref 22–32)
Calcium: 8.6 mg/dL — ABNORMAL LOW (ref 8.9–10.3)
Chloride: 85 mmol/L — ABNORMAL LOW (ref 98–111)
Creatinine, Ser: 3.01 mg/dL — ABNORMAL HIGH (ref 0.61–1.24)
GFR, Estimated: 20 mL/min — ABNORMAL LOW (ref >60)
Glucose, Bld: 112 mg/dL — ABNORMAL HIGH (ref 70–99)
Potassium: 3.1 mmol/L — ABNORMAL LOW (ref 3.5–5.1)
Sodium: 124 mmol/L — ABNORMAL LOW (ref 135–145)
Total Bilirubin: 1.3 mg/dL — ABNORMAL HIGH (ref 0.3–1.2)
Total Protein: 6 g/dL — ABNORMAL LOW (ref 6.5–8.1)

## 2020-03-03 LAB — CBC
HCT: 27.8 % — ABNORMAL LOW (ref 39.0–52.0)
Hemoglobin: 8.5 g/dL — ABNORMAL LOW (ref 13.0–17.0)
MCH: 31.5 pg (ref 26.0–34.0)
MCHC: 30.6 g/dL (ref 30.0–36.0)
MCV: 103 fL — ABNORMAL HIGH (ref 80.0–100.0)
Platelets: 65 10*3/uL — ABNORMAL LOW (ref 150–400)
RBC: 2.7 MIL/uL — ABNORMAL LOW (ref 4.22–5.81)
RDW: 18.4 % — ABNORMAL HIGH (ref 11.5–15.5)
WBC: 3.2 10*3/uL — ABNORMAL LOW (ref 4.0–10.5)
nRBC: 0 % (ref 0.0–0.2)

## 2020-03-03 LAB — RENAL FUNCTION PANEL
Albumin: 3.1 g/dL — ABNORMAL LOW (ref 3.5–5.0)
Anion gap: 15 (ref 5–15)
BUN: 111 mg/dL — ABNORMAL HIGH (ref 8–23)
CO2: 25 mmol/L (ref 22–32)
Calcium: 8.8 mg/dL — ABNORMAL LOW (ref 8.9–10.3)
Chloride: 86 mmol/L — ABNORMAL LOW (ref 98–111)
Creatinine, Ser: 2.9 mg/dL — ABNORMAL HIGH (ref 0.61–1.24)
GFR, Estimated: 21 mL/min — ABNORMAL LOW (ref >60)
Glucose, Bld: 117 mg/dL — ABNORMAL HIGH (ref 70–99)
Phosphorus: 5.7 mg/dL — ABNORMAL HIGH (ref 2.5–4.6)
Potassium: 2.9 mmol/L — ABNORMAL LOW (ref 3.5–5.1)
Sodium: 126 mmol/L — ABNORMAL LOW (ref 135–145)

## 2020-03-03 LAB — PHOSPHORUS: Phosphorus: 5.5 mg/dL — ABNORMAL HIGH (ref 2.5–4.6)

## 2020-03-03 LAB — TROPONIN I (HIGH SENSITIVITY)
Troponin I (High Sensitivity): 44 ng/L — ABNORMAL HIGH (ref <18)
Troponin I (High Sensitivity): 39 ng/L — ABNORMAL HIGH (ref <18)

## 2020-03-03 LAB — BASIC METABOLIC PANEL
Anion gap: 18 — ABNORMAL HIGH (ref 5–15)
BUN: 114 mg/dL — ABNORMAL HIGH (ref 8–23)
CO2: 21 mmol/L — ABNORMAL LOW (ref 22–32)
Calcium: 8.7 mg/dL — ABNORMAL LOW (ref 8.9–10.3)
Chloride: 87 mmol/L — ABNORMAL LOW (ref 98–111)
Creatinine, Ser: 2.92 mg/dL — ABNORMAL HIGH (ref 0.61–1.24)
GFR, Estimated: 21 mL/min — ABNORMAL LOW (ref >60)
Glucose, Bld: 119 mg/dL — ABNORMAL HIGH (ref 70–99)
Potassium: 3.8 mmol/L (ref 3.5–5.1)
Sodium: 126 mmol/L — ABNORMAL LOW (ref 135–145)

## 2020-03-03 LAB — PROTIME-INR
INR: 2.8 — ABNORMAL HIGH (ref 0.8–1.2)
Prothrombin Time: 28.3 seconds — ABNORMAL HIGH (ref 11.4–15.2)

## 2020-03-03 LAB — GLUCOSE, CAPILLARY
Glucose-Capillary: 107 mg/dL — ABNORMAL HIGH (ref 70–99)
Glucose-Capillary: 108 mg/dL — ABNORMAL HIGH (ref 70–99)

## 2020-03-03 LAB — T4, FREE: Free T4: 0.72 ng/dL (ref 0.61–1.12)

## 2020-03-03 LAB — TSH: TSH: 28.38 u[IU]/mL — ABNORMAL HIGH (ref 0.350–4.500)

## 2020-03-03 MED ORDER — WARFARIN SODIUM 2.5 MG PO TABS
2.5000 mg | ORAL_TABLET | Freq: Once | ORAL | Status: AC
  Administered 2020-03-03: 2.5 mg via ORAL
  Filled 2020-03-03: qty 1

## 2020-03-03 MED ORDER — SODIUM CHLORIDE 0.9 % IV SOLN
1.0000 g | INTRAVENOUS | Status: DC
  Filled 2020-03-03: qty 1

## 2020-03-03 MED ORDER — POTASSIUM CHLORIDE CRYS ER 20 MEQ PO TBCR: 20.0000 meq | EXTENDED_RELEASE_TABLET | Freq: Once | ORAL | Status: DC

## 2020-03-03 MED ORDER — POTASSIUM CHLORIDE CRYS ER 20 MEQ PO TBCR
40.0000 meq | EXTENDED_RELEASE_TABLET | Freq: Once | ORAL | Status: AC
  Administered 2020-03-03: 40 meq via ORAL
  Filled 2020-03-03: qty 2

## 2020-03-03 MED ORDER — SODIUM CHLORIDE 0.9 % IV SOLN
2.0000 g | Freq: Two times a day (BID) | INTRAVENOUS | Status: DC
  Administered 2020-03-03 – 2020-03-05 (×4): 2 g via INTRAVENOUS
  Filled 2020-03-03 (×4): qty 2

## 2020-03-03 MED ORDER — CEFAZOLIN SODIUM-DEXTROSE 2-4 GM/100ML-% IV SOLN: 2.0000 g | INTRAVENOUS | Status: AC

## 2020-03-03 MED ORDER — HYDROCOD POLST-CPM POLST ER 10-8 MG/5ML PO SUER
5.0000 mL | Freq: Once | ORAL | Status: AC
  Administered 2020-03-03: 5 mL via ORAL
  Filled 2020-03-03: qty 5

## 2020-03-03 MED ORDER — INSULIN ASPART 100 UNIT/ML ~~LOC~~ SOLN: 0.0000 [IU] | Freq: Every day | SUBCUTANEOUS | Status: DC

## 2020-03-03 MED ORDER — SODIUM CHLORIDE 0.9 % IV SOLN
2.0000 g | Freq: Once | INTRAVENOUS | Status: AC
  Administered 2020-03-03: 2 g via INTRAVENOUS
  Filled 2020-03-03: qty 2

## 2020-03-03 MED ORDER — PHENYLEPHRINE CONCENTRATED 100MG/250ML (0.4 MG/ML) INFUSION SIMPLE
0.0000 ug/min | INTRAVENOUS | Status: DC
  Administered 2020-03-03: 20 ug/min via INTRAVENOUS
  Administered 2020-03-04: 220 ug/min via INTRAVENOUS
  Filled 2020-03-03 (×4): qty 250

## 2020-03-03 MED ORDER — PHENYLEPHRINE HCL-NACL 10-0.9 MG/250ML-% IV SOLN
0.0000 ug/min | INTRAVENOUS | Status: DC
  Filled 2020-03-03: qty 250

## 2020-03-03 MED ORDER — SODIUM CHLORIDE 0.9 % FOR CRRT: INTRAVENOUS_CENTRAL | Status: DC | PRN

## 2020-03-03 MED ORDER — CHLORHEXIDINE GLUCONATE CLOTH 2 % EX PADS
6.0000 | MEDICATED_PAD | Freq: Every day | CUTANEOUS | Status: DC
  Administered 2020-03-04 – 2020-03-16 (×13): 6 via TOPICAL

## 2020-03-03 MED ORDER — LIDOCAINE HCL (PF) 1 % IJ SOLN
INTRAMUSCULAR | Status: AC | PRN
  Administered 2020-03-03: 10 mL

## 2020-03-03 MED ORDER — MIDODRINE HCL 5 MG PO TABS
5.0000 mg | ORAL_TABLET | Freq: Three times a day (TID) | ORAL | Status: DC
  Administered 2020-03-03 – 2020-03-05 (×7): 5 mg via ORAL
  Filled 2020-03-03 (×7): qty 1

## 2020-03-03 MED ORDER — HEPARIN SODIUM (PORCINE) 1000 UNIT/ML IJ SOLN
INTRAMUSCULAR | Status: AC
  Administered 2020-03-03: 2.8 mL
  Filled 2020-03-03: qty 1

## 2020-03-03 MED ORDER — INSULIN ASPART 100 UNIT/ML ~~LOC~~ SOLN
0.0000 [IU] | Freq: Three times a day (TID) | SUBCUTANEOUS | Status: DC
  Administered 2020-03-04: 3 [IU] via SUBCUTANEOUS
  Administered 2020-03-05: 2 [IU] via SUBCUTANEOUS
  Administered 2020-03-05: 3 [IU] via SUBCUTANEOUS
  Administered 2020-03-05 – 2020-03-06 (×2): 2 [IU] via SUBCUTANEOUS
  Administered 2020-03-06 – 2020-03-07 (×3): 3 [IU] via SUBCUTANEOUS
  Administered 2020-03-08: 2 [IU] via SUBCUTANEOUS
  Administered 2020-03-08: 3 [IU] via SUBCUTANEOUS
  Administered 2020-03-08 – 2020-03-11 (×6): 2 [IU] via SUBCUTANEOUS
  Administered 2020-03-11: 3 [IU] via SUBCUTANEOUS
  Administered 2020-03-12 – 2020-03-14 (×6): 2 [IU] via SUBCUTANEOUS
  Administered 2020-03-15: 12:00:00 1 [IU] via SUBCUTANEOUS
  Administered 2020-03-16: 09:00:00 2 [IU] via SUBCUTANEOUS

## 2020-03-03 MED ORDER — PRISMASOL BGK 4/2.5 32-4-2.5 MEQ/L EC SOLN
Status: DC
  Filled 2020-03-03 (×76): qty 5000

## 2020-03-03 MED ORDER — PRISMASOL BGK 4/2.5 32-4-2.5 MEQ/L REPLACEMENT SOLN
Status: DC
  Filled 2020-03-03 (×25): qty 5000

## 2020-03-03 MED ORDER — POTASSIUM CHLORIDE 10 MEQ/50ML IV SOLN
10.0000 meq | INTRAVENOUS | Status: AC
  Administered 2020-03-03 (×3): 10 meq via INTRAVENOUS
  Filled 2020-03-03 (×3): qty 50

## 2020-03-03 MED ORDER — FUROSEMIDE 10 MG/ML IJ SOLN
160.0000 mg | Freq: Once | INTRAVENOUS | Status: DC
  Filled 2020-03-03: qty 16

## 2020-03-03 MED ORDER — PRISMASOL BGK 4/2.5 32-4-2.5 MEQ/L REPLACEMENT SOLN
Status: DC
  Filled 2020-03-03 (×15): qty 5000

## 2020-03-03 MED ORDER — SODIUM CHLORIDE 0.9 % IV SOLN
250.0000 mL | INTRAVENOUS | Status: DC
  Administered 2020-03-03 – 2020-03-15 (×2): 250 mL via INTRAVENOUS

## 2020-03-03 MED ORDER — LIDOCAINE HCL 1 % IJ SOLN
INTRAMUSCULAR | Status: AC
  Filled 2020-03-03: qty 20

## 2020-03-03 NOTE — Consult Note (Signed)
WOC Nurse Consult Note: Patient receiving care in [facility] ED 16. Reason for Consult: RLE wound Wound type: presumably related to excess fluid in leg; although, the patient had unna boots on BLE.  The unna boots do not have any strike through drainage on them that I could see.  The patient stated they were put on last week, on a holiday. Pressure Injury POA: Yes/No/NA Measurement: To be provided by the bedside RN in the flowsheet section Wound bed: Drainage (amount, consistency, odor)  Periwound: Dressing procedure/placement/frequency: BLE unna boots to be removed today by primary RN, legs washed, any wounds measured and information placed in the flowsheet, Xeroform applied if indicated, and unna boots replaced bilaterally.  These orders have been entered.  Thank you for the consult.  Discussed plan of care with the patient.  [place] nurse will not follow at this time.  Please re-consult the [place] team if needed.  Val Riles, RN, MSN, CWOCN, CNS-BC, pager [phone]

## 2020-03-03 NOTE — Consult Note (Addendum)
Chief Complaint: Patient was seen in consultation today for tunneled HD catheter placement.  Referring Physician(s): Otelia Santee  Supervising Physician: Markus Daft  Patient Status: [facility] - ED  History of Present Illness: [name] is a 84 y.o. male with a past medical history significant for OSA,DM, CHF, CAD s/p left carotid endarterectomy and CABG, rheumatic fever, mitral regurgitation s/p mechanical aortic valve replacement, a.fib on Coumadin, HTN, BPH w/ chronic indwelling foley and CKD IV who presented to [facility] ED yesterday afternoon with complaints of dyspnea, lower extremity/abdominal swelling, weight gain and weakness for the past several months. He reports that a home health nurse comes to see him several times per week to administer IV lasix and to draw labs - his most recent lab work showed hyponatremia and worsening renal function so he was instructed to present to the ED. Nephrology was consulted and IR has been asked to place a tunneled HD catheter to initiate hemodialysis.  [name] reports that he feels ok right now but gets very short of breath with any activity, he also gets short of breath when laying flat and often sleeps sitting up. He tells me he uses oxygen continuously at home but then tells me that "it took Dr. Darci Current office a couple weeks to send it so I haven't started yet." He reports taking Coumadin 5 mg twice weekly and then 2.5 mg the other days, he has not missed any doses and believes he was given Coumadin last night in the ED. He acknowledges that he plans to begin HD and states that he was supposed to go see vascular surgery as an outpatient to discuss having a fistula placed, however he is agreeable to have a tunneled HD catheter placed today so he can start dialysis.  Past Medical History:  Diagnosis Date  . Carotid artery disease ([place]) 1994   s/p left carotid endarerectomy   . Chronic atrial fibrillation (HCC)    a. on coumadin   . Chronic diastolic  CHF (congestive heart failure) ([place])   . Diabetes mellitus without complication ([place])    dx 2016  . Dyspnea   . Epistaxis   . Heart murmur   . Hx of CABG    a. 1994  . Hypertension   . OSA (obstructive sleep apnea)   . Rheumatic fever   . S/P AVR (aortic valve replacement)    a. mechanical valve 1996  . Subclavian bypass stenosis ([place])   . 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 Urinary catheter in place 12/2019  . Vitamin B12 deficiency     Past Surgical History:  Procedure Laterality Date  . artificial valve    . [place]   replaced due to aortic stenosis, [employer] mechanical prostesis  . CAROTID ENDARTERECTOMY Left 1997   subclavian bypass Done in [place]  . CORONARY ARTERY BYPASS GRAFT  1994   w SVG to RCA and SVG to circumflex  . heart bypass     Done in [place]  . MULTIPLE EXTRACTIONS WITH ALVEOLOPLASTY Bilateral 12/12/2017   Procedure: Extraction of tooth #'s 1, 12,13,14,15,17,18,19, 29, and 30 with alveoloplasty and gross debridement of remaining teeth`;  Surgeon: Lenn Cal, DDS;  Location: [facility];  Service: Oral Surgery;  Laterality: Bilateral;  MULTIPLE EXTRACTION WITH ALVEOLOPLASTY WITH PRE PROSTHETIC SURGERY AND GROSS DEBRIDEMENT OF REMAINING TEETH  . RIGHT HEART CATH AND CORONARY/GRAFT ANGIOGRAPHY N/A 04/30/2018   Procedure: RIGHT  HEART CATH AND CORONARY/GRAFT ANGIOGRAPHY;  Surgeon: Larey Dresser, MD;  Location: [facility];  Service: Cardiovascular;  Laterality: N/A;  . TEE WITHOUT CARDIOVERSION N/A 10/23/2017   Procedure: TRANSESOPHAGEAL ECHOCARDIOGRAM (TEE);  Surgeon: Larey Dresser, MD;  Location: [facility] ENDOSCOPY;  Service: Cardiovascular;  Laterality: N/A;  . TONSILLECTOMY      Allergies: Rifampin and Vancomycin  Medications: Prior to Admission medications   Medication Sig Start Date End Date Taking? Authorizing Provider  acetaminophen (TYLENOL) 500 MG tablet  Take 1,000 mg by mouth every 6 (six) hours as needed for moderate pain or headache.   Yes [provider]  albuterol (VENTOLIN HFA) 108 (90 Base) MCG/ACT inhaler INHALE 2 PUFFS INTO THE LUNGS EVERY 4 (FOUR) HOURS AS NEEDED FOR WHEEZING OR SHORTNESS OF BREATH. 02/25/20 02/24/21 Yes [name], [name], MD  allopurinol (ZYLOPRIM) 100 MG tablet TAKE 1 TABLET BY MOUTH EVERY DAY Patient taking differently: Take 100 mg by mouth daily.  09/02/19  Yes Larey Dresser, MD  amiodarone (PACERONE) 200 MG tablet TAKE 1 TABLET BY MOUTH EVERY DAY Patient taking differently: Take 200 mg by mouth daily.  03/01/20  Yes Clegg, Amy D, NP  carvedilol (COREG) 3.125 MG tablet Take 1 tablet (3.125 mg total) by mouth 2 (two) times daily. 08/04/19  Yes Larey Dresser, MD  cholecalciferol (VITAMIN D3) 25 MCG (1000 UNIT) tablet Take 1,000 Units by mouth at bedtime.   Yes [provider]  ezetimibe-simvastatin (VYTORIN) 10-20 MG tablet TAKE 1 TABLET BY MOUTH EVERYDAY AT BEDTIME Patient taking differently: Take 1 tablet by mouth at bedtime.  07/28/19  Yes [name], [name], MD  finasteride (PROSCAR) 5 MG tablet Take 5 mg by mouth at bedtime.    Yes [provider]  folic acid (FOLVITE) 007 MCG tablet Take 400 mcg by mouth at bedtime.   Yes [provider]  levothyroxine (SYNTHROID) 50 MCG tablet Take 1.5 tablets (75 mcg total) by mouth daily before breakfast. 10/14/19  Yes Larey Dresser, MD  LOKELMA 10 g PACK packet Take 1 packet by mouth See admin instructions. 1 time dose 02/24/20  Yes [provider]  metolazone (ZAROXOLYN) 5 MG tablet Take 1 tablet (5 mg total) by mouth 3 (three) times a week. Tuesdaym, Thursday and Saturday 02/09/20  Yes Larey Dresser, MD  polyethylene glycol powder (GLYCOLAX/MIRALAX) powder Take 17 g by mouth daily as needed for moderate constipation.   Yes [provider]  tamsulosin (FLOMAX) 0.4 MG CAPS capsule Take 0.4 mg by mouth daily after  supper.    Yes [provider]  torsemide (DEMADEX) 100 MG tablet Take 1 tablet (100 mg total) by mouth 2 (two) times daily. 02/02/20  Yes Larey Dresser, MD  triamcinolone ointment (KENALOG) 0.1 % Apply 1 application topically 2 (two) times daily. Patient taking differently: Apply 1 application topically daily as needed (rash).  12/25/18  Yes [name], [name], MD  vitamin B-12 (CYANOCOBALAMIN) 1000 MCG tablet Take 1,000 mcg by mouth at bedtime.   Yes [provider]  Vitamins A & D (VITAMIN A & D) ointment Apply 1 application topically as needed (blisters).   Yes [provider]  warfarin (COUMADIN) 5 MG tablet TAKE  AS DIRECTED BY COUMADIN CLINIC Patient taking differently: Take 5 mg by mouth See admin instructions. 1 tablet(5mg )on Saturday and Thursday  1/2 tablet(2.5mg ) Monday,tuesday,wednesday,friday,sunday 10/01/19  Yes Belva Crome, MD     Family History  Problem Relation Age of Onset  .  Cancer Father        lung   . Hypertension Mother     Social History   Socioeconomic History  . Marital status: Married    Spouse name: Not on file  . Number of children: 3  . Years of education: Not on file  . Highest education level: Not on file  Occupational History  . Not on file  Tobacco Use  . Smoking status: Former Smoker    Packs/day: 1.00    Years: 30.00    Pack years: 30.00    Types: Cigarettes    Quit date: 04/03/1992    Years since quitting: 27.9  . Smokeless tobacco: Never Used  Vaping Use  . Vaping Use: Never used  Substance and Sexual Activity  . Alcohol use: Yes    Comment: 4-6 ounces of wine daily  . Drug use: No    Comment: Half a cup a day.  Marland Kitchen Sexual activity: Not on file  Other Topics Concern  . Not on file  Social History Narrative   Patient is married and lives with his wife.   Youngest daughter lives in [place] also.   Patient has 2 other children.   Social Determinants of Health   Financial Resource Strain:   .  Difficulty of Paying Living Expenses: Not on file  Food Insecurity:   . Worried About Charity fundraiser in the Last Year: Not on file  . Ran Out of Food in the Last Year: Not on file  Transportation Needs:   . Lack of Transportation (Medical): Not on file  . Lack of Transportation (Non-Medical): Not on file  Physical Activity:   . Days of Exercise per Week: Not on file  . Minutes of Exercise per Session: Not on file  Stress:   . Feeling of Stress : Not on file  Social Connections:   . Frequency of Communication with Friends and Family: Not on file  . Frequency of Social Gatherings with Friends and Family: Not on file  . Attends Religious Services: Not on file  . Active Member of Clubs or Organizations: Not on file  . Attends Archivist Meetings: Not on file  . Marital Status: Not on file     Review of Systems: A 12 point ROS discussed and pertinent positives are indicated in the HPI above.  All other systems are negative.  Review of Systems  Constitutional: Positive for fatigue. Negative for appetite change, chills and fever.  Respiratory: Positive for shortness of breath (none currently but reports any activity causes dyspnea). Negative for cough.   Cardiovascular: Positive for leg swelling. Negative for chest pain and palpitations (none currently, "they come and go with my a.fib").  Gastrointestinal: Positive for abdominal distention. Negative for abdominal pain, blood in stool, diarrhea, nausea and vomiting.  Genitourinary: Positive for difficulty urinating (foley in place). Negative for hematuria.  Musculoskeletal: Negative for back pain.  Neurological: Negative for dizziness and headaches.    Vital Signs: BP 99/76   Pulse 66   Temp 97.7 F (36.5 C) (Oral)   Resp 18   SpO2 99%   Physical Exam Vitals and nursing note reviewed.  Constitutional:      General: He is not in acute distress.    Appearance: He is ill-appearing.  HENT:     Head: Normocephalic.      Mouth/Throat:     Mouth: Mucous membranes are moist.     Pharynx: Oropharynx is clear. No oropharyngeal exudate or posterior  oropharyngeal erythema.  Cardiovascular:     Rate and Rhythm: Bradycardia present. Rhythm irregular.     Heart sounds: Murmur heard.      Comments: External pacing pads in place - not currently being paced Pulmonary:     Breath sounds: Wheezing present.     Comments: (+) 2L O2 via [place] Abdominal:     General: There is no distension.     Palpations: Abdomen is soft.     Tenderness: There is no abdominal tenderness.  Genitourinary:    Comments: (+) foley draining clear yellow urine Musculoskeletal:     Right lower leg: Edema present.     Left lower leg: Edema present.  Skin:    General: Skin is warm and dry.  Neurological:     Mental Status: He is alert and oriented to person, place, and time.  Psychiatric:        Mood and Affect: Mood normal.        Behavior: Behavior normal.        Thought Content: Thought content normal.        Judgment: Judgment normal.      MD Evaluation Airway: WNL Heart: WNL Abdomen: WNL Chest/ Lungs: WNL ASA  Classification: 3 Mallampati/Airway Score: Two   Imaging: DG CHEST PORT 1 VIEW  Result Date: 03/03/2020 CLINICAL DATA:  Increased lower extremity edema EXAM: PORTABLE CHEST 1 VIEW COMPARISON:  03/02/2020 FINDINGS: Cardiac shadow is enlarged but stable. Postsurgical changes are again seen and stable. Aortic calcifications are noted. Mild vascular congestion is noted with mild interstitial edema. No focal infiltrate is seen. No bony abnormality is noted. IMPRESSION: Changes of mild CHF. Electronically Signed   By: Inez Catalina M.D.   On: 03/03/2020 02:40   DG Chest Port 1 View  Result Date: 03/02/2020 CLINICAL DATA:  Hyponatremia, lower extremity edema, decreased renal function, short of breath EXAM: PORTABLE CHEST 1 VIEW COMPARISON:  01/09/2018 FINDINGS: Single frontal view of the chest demonstrates an enlarged  cardiac silhouette. There is chronic central vascular congestion, with diffuse interstitial prominence likely reflecting an element of interstitial edema. There is also left basilar consolidation and small left pleural effusion, favor asymmetric edema over infection. No pneumothorax. No acute bony abnormalities. IMPRESSION: 1. Findings most consistent with congestive heart failure. Electronically Signed   By: Randa Ngo M.D.   On: 03/02/2020 17:20    Labs:  CBC: Recent Labs    12/20/19 1655 02/24/20 1430 03/02/20 1510 03/03/20 0409  WBC 4.1 6.3 4.4 3.2*  HGB 7.2* 8.2* 8.3* 8.5*  HCT 21.7* 27.6* 26.2* 27.8*  PLT 89* 91* 78* 65*    COAGS: Recent Labs    03/25/19 0954 04/15/19 0842 02/24/20 1250 03/01/20 1051 03/02/20 1510 03/03/20 0409  INR 4.1*   < > 3.5* 3.2* 2.7* 2.8*  APTT 51.3*  --   --   --   --   --    < > = values in this interval not displayed.    BMP: Recent Labs    08/20/19 1105 08/20/19 1105 10/14/19 1044 10/14/19 1044 11/12/19 0820 11/12/19 0820 12/02/19 0927 01/08/20 1008 02/25/20 1055 03/01/20 1051 03/02/20 1510 03/03/20 0409  NA 137   < > 130*   < > 135   < > 128*   < > 124* 122* 124* 124*  K 4.0   < > 4.2   < > 5.0   < > 4.8   < > 3.9 3.8 3.9 3.1*  CL 96*   < >  90*   < > 96*   < > 92*   < > 89* 86* 85* 85*  CO2 26   < > 25   < > 25   < > 23   < > 22 23 24 24   GLUCOSE 111*   < > 155*   < > 128*   < > 130*   < > 147* 115* 118* 112*  BUN 78*   < > 106*   < > 88*   < > 127*   < > 104* 111* 116* 114*  CALCIUM 9.2   < > 9.2   < > 10.1   < > 9.4   < > 8.3* 8.3* 8.6* 8.6*  CREATININE 2.40*   < > 2.82*   < > 3.05*  --  3.19*   < > 3.23* 3.32* 3.29* 3.01*  GFRNONAA 24*   < > 20*   < > 18*  --  17*   < > 18* 18* 18* 20*  GFRAA 28*  --  23*  --  21*  --  20*  --   --   --   --   --    < > = values in this interval not displayed.    LIVER FUNCTION TESTS: Recent Labs    12/02/19 0927 02/02/20 0953 03/02/20 1510 03/03/20 0409  BILITOT 0.9 1.2 1.1  1.3*  AST 29 39 28 24  ALT 20 29 20 18   ALKPHOS 96 151* 136* 118  PROT 7.9 6.8 6.4* 6.0*  ALBUMIN 3.8 3.3* 3.2* 3.1*    TUMOR MARKERS: No results for input(s): AFPTM, CEA, CA199, CHROMGRNA in the last 8760 hours.  Assessment and Plan:  84 y/o M with significant cardiac history and worsening CKD now requiring HD seen today for tunneled HD catheter placement.  Patient seen in ED - he just recently finished his breakfast (~0915) and is receiving PO medications during my exam. Discussed temporary HD catheter placement now vs tunneled HD catheter after ~3 pm today/possibly tomorrow AM if emergent IR procedures given patient unable to receive sedation due to not being NPO, per Dr. Augustin Coupe place tunneled HD catheter this afternoon vs tomorrow AM if we are unable to accommodate. NPO order placed 0930 today, may have sips with medications, also instructed patient to remain NPO for the remainder of today. INR 2.8 this morning, patient with mechanical aortic valve - Dr. Anselm Pancoast aware and agrees to tunneled HD catheter placement. 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 When patient is nearing discharge IR will need new order placed for tunneled HD catheter placement.   Risks and benefits discussed with the patient including, but not limited to bleeding, infection, vascular injury, pneumothorax which may require chest tube placement, air embolism or even death.  All of the patient's questions were answered, patient is agreeable to proceed.  Consent signed and in IR control room.  Thank you for this interesting consult.  I greatly enjoyed meeting Eddison Coppa and look forward to participating in their care.  A copy of this report was sent to the requesting provider on this  date.  Electronically Signed: Joaquim Nam, PA-C 03/03/2020, 9:27 AM   I spent a total of 40 Minutesin face to face in clinical consultation, greater than  50% of which was counseling/coordinating care for tunneled HD catheter placement.

## 2020-03-03 NOTE — Progress Notes (Signed)
PROGRESS NOTE    [name]  DHR:[id number] DOB: 31-Oct-1935 DOA: 03/02/2020 PCP: Cassandria Anger, MD   Brief Narrative:  Patient is a 84 year old male with past medical history of chronic combined CHF, severe mitral regurgitation with history of mechanical aortic valve replacement, atrial fibrillation, coronary artery disease status post CABG, CKD stage IV, history of ITP, right lower extremity wound presented to emergency department with worsening shortness of breath, peripheral edema since 2 months.  In ED: Chest x-ray shows features concerning for CHF.  On exam patient has significant lower extremity edema and abdominal distention.  EKG shows sinus bradycardia.  Heart rate is around 44 bpm.  Creatinine: 3.2, 6, INR: 2.8, potassium: 3.9, sodium: 124, hemoglobin: 8.3, platelet: 78, COVID-19 negative.  TSH markedly elevated at 28.  Also found to have UTI.  Has Foley catheter.  Urine culture pending.  Cardiology and nephrology consulted.  Patient placed on Lasix IV and admitted for further evaluation and management of fluid overload/anasarca.  Assessment & Plan:  Anasarca: -In the setting of acute on chronic combined systolic and diastolic heart failure and worsening kidney function.  Failing high-dose outpatient diuretics. -Echo from 3/21 showed ejection fraction of 50 to 55%, decreased RV systolic function, severe rheumatic mitral regurgitation, normal mechanical aortic valve.   -Reviewed chest x-ray.  Patient is afebrile, COVID-19 negative -Cardiology and nephrology consulted -Patient is a scheduled to get tunneled hemodialysis catheter today-plan is for hemodialysis later today.  Appreciate help. -Continue IV Lasix   Acute on chronic CKD stage IV: -Creatinine 3.01, BUN: 116, potassium: 3.1. -Patient has history of nephrosclerosis and cardiorenal syndrome.  Patient appears to be in volume overloaded.  Failed outpatient aggressive diuresis - temporary tunneled hemodialysis catheter  placement today by IR. -Appreciate nephrology help. -Patient will be transferred to ICU given low blood pressure in the setting of volume overload-need HD.   -PCCM consulted.  Hypotension: Coreg discontinued.  Start midodrine as per cardiology.  Patient will be transferred to ICU for further management of hypotension  Chronic A. Fib: -Heart rate in 40s-50s. -Replace electrolytes.  Monitor heart rate closely on telemetry.  Mechanical aortic valve: -On Coumadin at home.  INR: 2.8.  Goal is 2.5-3.5.  Sent 2D echo shows normal functional aortic valve. -Coumadin dose as per pharmacy.  Monitor INR closely.  UTI: -UA concerning for infection.  Urine culture is pending.  Has chronic indwelling Foley catheter.  He is afebrile with no leukocytosis. -Continue cefepime and follow urine culture result  Anemia of chronic disease/iron deficiency anemia: -In the setting of CKD stage IV.  Hemoglobin 8.5. -IV Feraheme today  Hypokalemia: Replenished.  Chronic bilateral lower extremity wound: Wound care consulted.  History of coronary artery disease status post CABG: -Initial troponin 44 trended down to 39.  Patient denies ACS symptoms. -Likely demand ischemia in the setting of worsening kidney function and fluid overload.  Continue to monitor.  Continue statin.  Hypothyroidism: TSH is markedly elevated.  Continue Synthyroid.  Check free T4 and total T3.  BPH: Continue Proscar and Flomax  COPD: No wheezing noted on exam.  DuoNebs as needed.  ITP: Count trended down from 91-65 in 1 week.  Monitor platelets.  Hyponatremia: Likely in the setting of Lasix and hypervolemia. -Fluid restriction.  Repeat BMP tomorrow a.m.  DVT prophylaxis: Heparin Code Status: DNR Family Communication:  None present at bedside.  Plan of care discussed with patient in length and he verbalized understanding and agreed with it. Disposition Plan: To be determined  Consultants:    Cardiology  Nephrology  IR  PCCM  Procedures:   Dialysis catheter placement  Antimicrobials:   Cefepime  Cefazolin  Status is: Inpatient   Dispo: The patient is from: Home              Anticipated d/c is to: SNF              Anticipated d/c date is: > 3 days              Patient currently is not medically stable to d/c.   Subjective: Patient seen and examined.  Resting comfortably on the bed.  Eating breakfast.  Tells me that his shortness of breath has improved since yesterday.  Continues to have leg swelling, abdominal distention and has noticed blood in his sputum this morning.  Denies chest pain, nausea, vomiting, abdominal pain.  Objective: Vitals:   03/03/20 1030 03/03/20 1045 03/03/20 1100 03/03/20 1213  BP: (!) 88/48 (!) 87/42 100/61 (!) 87/68  Pulse: (!) 49 (!) 48 (!) 47 60  Resp: (!) 9 (!) 9 10 12   Temp:      TempSrc:      SpO2: 97% 96% 96% 100%    Intake/Output Summary (Last 24 hours) at 03/03/2020 1231 Last data filed at 03/03/2020 1136 Gross per 24 hour  Intake 100 ml  Output 2000 ml  Net -1900 ml   There were no vitals filed for this visit.  Examination:  General exam: Appears calm and comfortable, elderly, oxygen via nasal cannula, communicating well Respiratory system: Decreased breath sounds noted on the bases. Cardiovascular system: Bradycardic S1 & S2 heard, RRR. No JVD, murmurs, rubs, gallops or clicks.  Bilateral 3+ pitting edema positive Gastrointestinal system: Abdomen is soft and distended, and nontender. No organomegaly or masses felt. Normal bowel sounds heard. Central nervous system: Alert and oriented. No focal neurological deficits. Extremities: Symmetric 5 x 5 power. Skin: Multiple bruises noted in left upper extremity.  Dressing dry intact in bilateral lower extremities.   Psychiatry: Judgement and insight appear normal. Mood & affect appropriate.    Data Reviewed: I have personally reviewed following labs and imaging  studies  CBC: Recent Labs  Lab 03/02/20 1510 03/03/20 0409  WBC 4.4 3.2*  NEUTROABS 3.5  --   HGB 8.3* 8.5*  HCT 26.2* 27.8*  MCV 103.1* 103.0*  PLT 78* 65*   Basic Metabolic Panel: Recent Labs  Lab 03/01/20 1051 03/02/20 1510 03/03/20 0409  NA 122* 124* 124*  K 3.8 3.9 3.1*  CL 86* 85* 85*  CO2 23 24 24   GLUCOSE 115* 118* 112*  BUN 111* 116* 114*  CREATININE 3.32* 3.29* 3.01*  CALCIUM 8.3* 8.6* 8.6*  PHOS  --   --  5.5*   GFR: Estimated Creatinine Clearance: 26.3 mL/min (A) (by C-G formula based on SCr of 3.01 mg/dL (H)). Liver Function Tests: Recent Labs  Lab 03/02/20 1510 03/03/20 0409  AST 28 24  ALT 20 18  ALKPHOS 136* 118  BILITOT 1.1 1.3*  PROT 6.4* 6.0*  ALBUMIN 3.2* 3.1*   No results for input(s): LIPASE, AMYLASE in the last 168 hours. No results for input(s): AMMONIA in the last 168 hours. Coagulation Profile: Recent Labs  Lab 03/01/20 1051 03/02/20 1510 03/03/20 0409  INR 3.2* 2.7* 2.8*   Cardiac Enzymes: No results for input(s): CKTOTAL, CKMB, CKMBINDEX, TROPONINI in the last 168 hours. BNP (last 3 results) No results for input(s): PROBNP in the last 8760 hours. HbA1C: No results  for input(s): HGBA1C in the last 72 hours. CBG: No results for input(s): GLUCAP in the last 168 hours. Lipid Profile: No results for input(s): CHOL, HDL, LDLCALC, TRIG, CHOLHDL, LDLDIRECT in the last 72 hours. Thyroid Function Tests: Recent Labs    03/03/20 0512  TSH 28.380*   Anemia Panel: No results for input(s): VITAMINB12, FOLATE, FERRITIN, TIBC, IRON, RETICCTPCT in the last 72 hours. Sepsis Labs: No results for input(s): PROCALCITON, LATICACIDVEN in the last 168 hours.  Recent Results (from the past 240 hour(s))  Resp Panel by RT-PCR (Flu A&B, Covid) Nasopharyngeal Swab     Status: None   Collection Time: 03/02/20  8:08 PM   Specimen: Nasopharyngeal Swab; Nasopharyngeal(NP) swabs in vial transport medium  Result Value Ref Range Status   SARS  Coronavirus 2 by RT PCR NEGATIVE NEGATIVE Final    Comment: (NOTE) SARS-CoV-2 target nucleic acids are NOT DETECTED.  The SARS-CoV-2 RNA is generally detectable in upper respiratory specimens during the acute phase of infection. The lowest concentration of SARS-CoV-2 viral copies this assay can detect is 138 copies/mL. A negative result does not preclude SARS-Cov-2 infection and should not be used as the sole basis for treatment or other patient management decisions. A negative result may occur with  improper specimen collection/handling, submission of specimen other than nasopharyngeal swab, presence of viral mutation(s) within the areas targeted by this assay, and inadequate number of viral copies(<138 copies/mL). A negative result must be combined with clinical observations, patient history, and epidemiological information. The expected result is Negative.  Fact Sheet for Patients:  EntrepreneurPulse.com.au  Fact Sheet for Healthcare Providers:  IncredibleEmployment.be  This test is no t yet approved or cleared by the [place] FDA and  has been authorized for detection and/or diagnosis of SARS-CoV-2 by FDA under an Emergency Use Authorization (EUA). This EUA will remain  in effect (meaning this test can be used) for the duration of the COVID-19 declaration under Section 564(b)(1) of the Act, 21 U.S.C.section 360bbb-3(b)(1), unless the authorization is terminated  or revoked sooner.       Influenza A by PCR NEGATIVE NEGATIVE Final   Influenza B by PCR NEGATIVE NEGATIVE Final    Comment: (NOTE) The Xpert Xpress SARS-CoV-2/FLU/RSV plus assay is intended as an aid in the diagnosis of influenza from Nasopharyngeal swab specimens and should not be used as a sole basis for treatment. Nasal washings and aspirates are unacceptable for Xpert Xpress SARS-CoV-2/FLU/RSV testing.  Fact Sheet for  Patients: EntrepreneurPulse.com.au  Fact Sheet for Healthcare Providers: IncredibleEmployment.be  This test is not yet approved or cleared by the [place] FDA and has been authorized for detection and/or diagnosis of SARS-CoV-2 by FDA under an Emergency Use Authorization (EUA). This EUA will remain in effect (meaning this test can be used) for the duration of the COVID-19 declaration under Section 564(b)(1) of the Act, 21 U.S.C. section 360bbb-3(b)(1), unless the authorization is terminated or revoked.  Performed at [facility], [place] [address], [place], [place] [address]       Radiology Studies: DG CHEST PORT 1 VIEW  Result Date: 03/03/2020 CLINICAL DATA:  Increased lower extremity edema EXAM: PORTABLE CHEST 1 VIEW COMPARISON:  03/02/2020 FINDINGS: Cardiac shadow is enlarged but stable. Postsurgical changes are again seen and stable. Aortic calcifications are noted. Mild vascular congestion is noted with mild interstitial edema. No focal infiltrate is seen. No bony abnormality is noted. IMPRESSION: Changes of mild CHF. Electronically Signed   By: Linus Mako.D.  On: 03/03/2020 02:40   DG Chest Port 1 View  Result Date: 03/02/2020 CLINICAL DATA:  Hyponatremia, lower extremity edema, decreased renal function, short of breath EXAM: PORTABLE CHEST 1 VIEW COMPARISON:  01/09/2018 FINDINGS: Single frontal view of the chest demonstrates an enlarged cardiac silhouette. There is chronic central vascular congestion, with diffuse interstitial prominence likely reflecting an element of interstitial edema. There is also left basilar consolidation and small left pleural effusion, favor asymmetric edema over infection. No pneumothorax. No acute bony abnormalities. IMPRESSION: 1. Findings most consistent with congestive heart failure. Electronically Signed   By: Randa Ngo M.D.   On: 03/02/2020 17:20    Scheduled Meds: . allopurinol  100 mg Oral  Daily  . Chlorhexidine Gluconate Cloth  6 each Topical Q0600  . ezetimibe  10 mg Oral QHS   And  . simvastatin  20 mg Oral QHS  . finasteride  5 mg Oral QHS  . folic acid  1,884 mcg Oral Daily  . levothyroxine  75 mcg Oral QAC breakfast  . lidocaine      . midodrine  5 mg Oral TID WC  . potassium chloride  40 mEq Oral Once  . tamsulosin  0.4 mg Oral QPC supper  . vitamin B-12  1,000 mcg Oral QHS  . warfarin  2.5 mg Oral ONCE-1600  . Warfarin - Pharmacist Dosing Inpatient   Does not apply q1600   Continuous Infusions: . sodium chloride    . sodium chloride    .  ceFAZolin (ANCEF) IV    . furosemide       LOS: 1 day   Time spent: 45 minutes.   Mckinley Jewel, MD [facility]  If 7PM-7AM, please contact night-coverage [email or web address] 03/03/2020, 12:31 PM

## 2020-03-03 NOTE — Telephone Encounter (Signed)
1007 am. Reviewing chart to schedule PC visit.  Noted patient was in the ED.  Contacted wife [name] to follow up.  She notes patient's symptoms were worsening.  Fluid buildup in the thighs and abdomen were causing worsening shortness of breath despite IV lasix administration 2 x weekly.  [name] states oxygen arrived last night so patient will be set up when he returns home.  I have advised that I would notify our liaison team in the hospital of patient's location.   Advised that PC would continue to follow patient at home if appropriate.  Wife voiced appreciation for follow up call.  PC hospital liaison team notified of patient location via email.

## 2020-03-03 NOTE — Progress Notes (Signed)
Orthopedic Tech Progress Note Patient Details:  Doren Kaspar [birth year] [id number] Was called to apply a pair of UNNA BOOTS to patient. I removed cleaned legs I saw 2 wounds on patient legs. Told MD but patient is being admitted to the hospital. Patient ID: Wilgus Deyton, male   DOB: Jul 27, 1935, 84 y.o.   MRN: [id number]   Janit Pagan 03/03/2020, 10:31 AM

## 2020-03-03 NOTE — Consult Note (Addendum)
Advanced Heart Failure Team Consult Note   Primary Physician: Cassandria Anger, MD PCP-Cardiologist:  [name], MD  [facility]: Dr. [name]   Reason for Consultation: Acute on chronic diastolic heart failure   HPI:    [name] is seen today for evaluation of acute on chronic diastolic heart failure at the request of Dr. Doristine Bosworth, Internal Medicine.   [name] is a 84 y.o. male with h/o mechanical AVR 1994, OSA on CPAP, Chronic afib, chronic combined CHF, CKD III, chronic coumadin therapy, CAD s/p CAGB 1994, and COPD.   Admitted 7/18-11/05/17 from [facility] office with A/C diastolic HF. Diuresed 83 lbs with lasix drip, metolazone, and diamox. Transitioned to torsemide 80 mg BID. Determined not to be a candidate for MitraClip. Dr Liston Alba consulted andrecommended aggressive medical therapy versus  percutaneous MVR trial as he felt very high risk for conventional surgery.Course complicated by drop in hemoglobin and symptomatic hypotension, requiring 1 unit pRBCs. GI consulted and did not recommend scoping. BB and spiro were stopped due to low BPs. Had some AKI assoicated with low BP, but resolved by discharge. Required lovenox bridge at DC with subtherapeutic INR. Discharged with [facility]. DC weight: 216 lbs.   Admitted 9/11 - 12/14/17 for dental extractions in setting of work up for MVR. Hospital course complicated by run of VT in the setting of hypokalemia. Started on amiodarone with h/o of arrhythmia. Bridged with lovenox.   Admitted 9/22 - 12/28/17 with symptomatic anemia from bleeding gums. Bleeding stopped with holding heparin and coumadin. Received multiple units of blood.   He was admitted again in 10/19 with left thigh hematoma and went from there to inpatient rehab.   He has been evaluated in [place] for a transcatheter mitral valve.  RHC/LHC was done in 1/20 as part of his pre-op evaluation.  Filling pressures and cardiac output were well-compensated, he had patent  SVG-PDA.  Echo in 1/20 showed EF 40-45%, moderate RV dilation with normal systolic function, severe MR with likely rheumatic mitral valve, normal St Jude mechanical aortic valve. Unfortunately, it was determined that he would not qualify for any of the percutaneous mitral valve studies.    His diuretic doses have been up and down based on volume retention and creatinine.   Echo in 4/21 showed EF 50-55% with moderate LV dilation, severe MR, mildly decreased RV systolic function, dilated IVC, stable mechanical aortic valve.   He has struggled recently w/ volume overload in the setting of progressive renal failure. On high dose diuretics at home including scheduled home IV Lasix. Recently seen by nephrology and felt to be nearing need for HD. Was referred to [facility] for HD access. No AV fistula yet.   Given progressive fluid overload and increased dyspnea w/ poor response to outpatient diuretics, he presented to the ED. Exam w/ massive anasarca. Scr up to 3.3 and BUN 116. K 3.9. HFTs WNL. INR 2.8.  TSH markedly elevated at 28. Also found to have UTI. Has foley. UC pending. AF. WBC WNL. Also w/ intermittent CHB on tele but hemodynamically stable.  Mentating well. He remains in ED awaiting bed. Eating breakfast. No current resting dyspnea. He is being admitted by IM. Nephrology has seen and plans to start HD today.    Review of Systems: [y] = yes, [ ]  = no   . General: Weight gain [ Y]; Weight loss [ ] ; Anorexia [ ] ; Fatigue [ Y]; Fever [ ] ; Chills [ ] ; Weakness [Y ]  .  Cardiac: Chest pain/pressure [ ] ; Resting SOB [ ] ; Exertional SOB [ Y]; Orthopnea [ ] ; Pedal Edema [ ] ; Palpitations [ ] ; Syncope [ ] ; Presyncope [ ] ; Paroxysmal nocturnal dyspnea[ ]   . Pulmonary: Cough [ ] ; Wheezing[ ] ; Hemoptysis[ ] ; Sputum [ ] ; Snoring [ ]   . GI: Vomiting[ ] ; Dysphagia[ ] ; Melena[ ] ; Hematochezia [ ] ; Heartburn[ ] ; Abdominal pain [ ] ; Constipation [ ] ; Diarrhea [ ] ; BRBPR [ ]   . GU: Hematuria[ ] ; Dysuria [ ] ; Nocturia[  ]  . Vascular: Pain in legs with walking [ ] ; Pain in feet with lying flat [ ] ; Non-healing sores [ ] ; Stroke [ ] ; TIA [ ] ; Slurred speech [ ] ;  . Neuro: Headaches[ ] ; Vertigo[ ] ; Seizures[ ] ; Paresthesias[ ] ;Blurred vision [ ] ; Diplopia [ ] ; Vision changes [ ]   . Ortho/Skin: Arthritis [ ] ; Joint pain [ ] ; Muscle pain [ ] ; Joint swelling [ ] ; Back Pain [ ] ; Rash [ ]   . Psych: Depression[ ] ; Anxiety[ ]   . Heme: Bleeding problems [ ] ; Clotting disorders [ ] ; Anemia [ ]   . Endocrine: Diabetes [ ] ; Thyroid dysfunction[ ]   Home Medications Prior to Admission medications   Medication Sig Start Date End Date Taking? Authorizing Provider  acetaminophen (TYLENOL) 500 MG tablet Take 1,000 mg by mouth every 6 (six) hours as needed for moderate pain or headache.   Yes [provider]  albuterol (VENTOLIN HFA) 108 (90 Base) MCG/ACT inhaler INHALE 2 PUFFS INTO THE LUNGS EVERY 4 (FOUR) HOURS AS NEEDED FOR WHEEZING OR SHORTNESS OF BREATH. 02/25/20 02/24/21 Yes [name], [name], MD  allopurinol (ZYLOPRIM) 100 MG tablet TAKE 1 TABLET BY MOUTH EVERY DAY Patient taking differently: Take 100 mg by mouth daily.  09/02/19  Yes Larey Dresser, MD  amiodarone (PACERONE) 200 MG tablet TAKE 1 TABLET BY MOUTH EVERY DAY Patient taking differently: Take 200 mg by mouth daily.  03/01/20  Yes Clegg, Amy D, NP  carvedilol (COREG) 3.125 MG tablet Take 1 tablet (3.125 mg total) by mouth 2 (two) times daily. 08/04/19  Yes Larey Dresser, MD  cholecalciferol (VITAMIN D3) 25 MCG (1000 UNIT) tablet Take 1,000 Units by mouth at bedtime.   Yes [provider]  ezetimibe-simvastatin (VYTORIN) 10-20 MG tablet TAKE 1 TABLET BY MOUTH EVERYDAY AT BEDTIME Patient taking differently: Take 1 tablet by mouth at bedtime.  07/28/19  Yes [name], [name], MD  finasteride (PROSCAR) 5 MG tablet Take 5 mg by mouth at bedtime.    Yes [provider]  folic acid (FOLVITE) 326 MCG tablet Take 400 mcg by mouth at  bedtime.   Yes [provider]  levothyroxine (SYNTHROID) 50 MCG tablet Take 1.5 tablets (75 mcg total) by mouth daily before breakfast. 10/14/19  Yes Larey Dresser, MD  LOKELMA 10 g PACK packet Take 1 packet by mouth See admin instructions. 1 time dose 02/24/20  Yes [provider]  metolazone (ZAROXOLYN) 5 MG tablet Take 1 tablet (5 mg total) by mouth 3 (three) times a week. Tuesdaym, Thursday and Saturday 02/09/20  Yes Larey Dresser, MD  polyethylene glycol powder (GLYCOLAX/MIRALAX) powder Take 17 g by mouth daily as needed for moderate constipation.   Yes [provider]  tamsulosin (FLOMAX) 0.4 MG CAPS capsule Take 0.4 mg by mouth daily after supper.    Yes [provider]  torsemide (DEMADEX) 100 MG tablet Take 1 tablet (100 mg total) by mouth 2 (two) times daily. 02/02/20  Yes Loralie Champagne  S, MD  triamcinolone ointment (KENALOG) 0.1 % Apply 1 application topically 2 (two) times daily. Patient taking differently: Apply 1 application topically daily as needed (rash).  12/25/18  Yes [name], [name], MD  vitamin B-12 (CYANOCOBALAMIN) 1000 MCG tablet Take 1,000 mcg by mouth at bedtime.   Yes [provider]  Vitamins A & D (VITAMIN A & D) ointment Apply 1 application topically as needed (blisters).   Yes [provider]  warfarin (COUMADIN) 5 MG tablet TAKE  AS DIRECTED BY COUMADIN CLINIC Patient taking differently: Take 5 mg by mouth See admin instructions. 1 tablet(5mg )on Saturday and Thursday  1/2 tablet(2.5mg ) Monday,tuesday,wednesday,friday,sunday 10/01/19  Yes Belva Crome, MD    Past Medical History: Past Medical History:  Diagnosis Date  . Carotid artery disease ([place]) 1994   s/p left carotid endarerectomy   . Chronic atrial fibrillation (HCC)    a. on coumadin   . Chronic diastolic CHF (congestive heart failure) ([place])   . Diabetes mellitus without complication ([place])    dx 2016  . Dyspnea   . Epistaxis   . Heart  murmur   . Hx of CABG    a. 1994  . Hypertension   . OSA (obstructive sleep apnea)   . Rheumatic fever   . S/P AVR (aortic valve replacement)    a. mechanical valve 1996  . Subclavian bypass stenosis ([place])   . 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 Urinary catheter in place 12/2019  . Vitamin B12 deficiency     Past Surgical History: Past Surgical History:  Procedure Laterality Date  . artificial valve    . [place]   replaced due to aortic stenosis, St. Jude mechanical prostesis  . CAROTID ENDARTERECTOMY Left 1997   subclavian bypass Done in [place]  . CORONARY ARTERY BYPASS GRAFT  1994   w SVG to RCA and SVG to circumflex  . heart bypass     Done in [place]  . MULTIPLE EXTRACTIONS WITH ALVEOLOPLASTY Bilateral 12/12/2017   Procedure: Extraction of tooth #'s 1, 12,13,14,15,17,18,19, 29, and 30 with alveoloplasty and gross debridement of remaining teeth`;  Surgeon: Lenn Cal, DDS;  Location: [facility];  Service: Oral Surgery;  Laterality: Bilateral;  MULTIPLE EXTRACTION WITH ALVEOLOPLASTY WITH PRE PROSTHETIC SURGERY AND GROSS DEBRIDEMENT OF REMAINING TEETH  . RIGHT HEART CATH AND CORONARY/GRAFT ANGIOGRAPHY N/A 04/30/2018   Procedure: RIGHT HEART CATH AND CORONARY/GRAFT ANGIOGRAPHY;  Surgeon: Larey Dresser, MD;  Location: [facility];  Service: Cardiovascular;  Laterality: N/A;  . TEE WITHOUT CARDIOVERSION N/A 10/23/2017   Procedure: TRANSESOPHAGEAL ECHOCARDIOGRAM (TEE);  Surgeon: Larey Dresser, MD;  Location: [facility] ENDOSCOPY;  Service: Cardiovascular;  Laterality: N/A;  . TONSILLECTOMY      Family History: Family History  Problem Relation Age of Onset  . Cancer Father        lung   . Hypertension Mother     Social History: Social History   Socioeconomic History  . Marital status: Married    Spouse name: Not on file  . Number of children: 3  . Years of education: Not on file   . Highest education level: Not on file  Occupational History  . Not on file  Tobacco Use  . Smoking status: Former Smoker    Packs/day: 1.00    Years: 30.00    Pack years: 30.00    Types: Cigarettes    Quit  date: 04/03/1992    Years since quitting: 27.9  . Smokeless tobacco: Never Used  Vaping Use  . Vaping Use: Never used  Substance and Sexual Activity  . Alcohol use: Yes    Comment: 4-6 ounces of wine daily  . Drug use: No    Comment: Half a cup a day.  Marland Kitchen Sexual activity: Not on file  Other Topics Concern  . Not on file  Social History Narrative   Patient is married and lives with his wife.   Youngest daughter lives in [place] also.   Patient has 2 other children.   Social Determinants of Health   Financial Resource Strain:   . Difficulty of Paying Living Expenses: Not on file  Food Insecurity:   . Worried About Charity fundraiser in the Last Year: Not on file  . Ran Out of Food in the Last Year: Not on file  Transportation Needs:   . Lack of Transportation (Medical): Not on file  . Lack of Transportation (Non-Medical): Not on file  Physical Activity:   . Days of Exercise per Week: Not on file  . Minutes of Exercise per Session: Not on file  Stress:   . Feeling of Stress : Not on file  Social Connections:   . Frequency of Communication with Friends and Family: Not on file  . Frequency of Social Gatherings with Friends and Family: Not on file  . Attends Religious Services: Not on file  . Active Member of Clubs or Organizations: Not on file  . Attends Archivist Meetings: Not on file  . Marital Status: Not on file    Allergies:  Allergies  Allergen Reactions  . Rifampin Rash and Other (See Comments)    May have been caused by Vancomycin or Rifampin (??)  . Vancomycin Rash and Other (See Comments)    May have been caused by Vancomycin or Rifampin (??)    Objective:    Vital Signs:   Temp:  [97.3 F (36.3 C)] 97.3 F (36.3 C) (11/30  1501) Pulse Rate:  [41-66] 54 (12/01 0745) Resp:  [10-20] 13 (12/01 0745) BP: (82-151)/(36-138) 102/74 (12/01 0745) SpO2:  [87 %-100 %] 100 % (12/01 0745)    Weight change: There were no vitals filed for this visit.  Intake/Output:   Intake/Output Summary (Last 24 hours) at 03/03/2020 0813 Last data filed at 03/03/2020 0451 Gross per 24 hour  Intake 100 ml  Output 1100 ml  Net -1000 ml      Physical Exam    General:  Chronically ill/ fatigue appearing elderly WM. No resp difficulty HEENT: normal Neck: supple. JVP elevated to ear . Carotids 2+ bilat; no bruits. No lymphadenopathy or thyromegaly appreciated. Cor: PMI nondisplaced. Irregular rhythm. No rubs, gallops or murmurs. Lungs: decreased BS at the bases bilaterally, mild expiratory wheezing on left  Abdomen: edematous/obese No hepatosplenomegaly. No bruits or masses. Good bowel sounds. Extremities: no cyanosis, clubbing, rash, 3+ bilateral edema up to thighs  Neuro: alert & orientedx3, cranial nerves grossly intact. moves all 4 extremities w/o difficulty. Affect pleasant   Telemetry   Afib w/ SVR w/ transient CHB, current HR in 50s   EKG    CHB 48 bpm   Labs   Basic Metabolic Panel: Recent Labs  Lab 02/25/20 1055 02/25/20 1055 03/01/20 1051 03/02/20 1510 03/03/20 0409  NA 124*  --  122* 124* 124*  K 3.9  --  3.8 3.9 3.1*  CL 89*  --  86* 85*  85*  CO2 22  --  23 24 24   GLUCOSE 147*  --  115* 118* 112*  BUN 104*  --  111* 116* 114*  CREATININE 3.23*  --  3.32* 3.29* 3.01*  CALCIUM 8.3*   < > 8.3* 8.6* 8.6*  PHOS  --   --   --   --  5.5*   < > = values in this interval not displayed.    Liver Function Tests: Recent Labs  Lab 03/02/20 1510 03/03/20 0409  AST 28 24  ALT 20 18  ALKPHOS 136* 118  BILITOT 1.1 1.3*  PROT 6.4* 6.0*  ALBUMIN 3.2* 3.1*   No results for input(s): LIPASE, AMYLASE in the last 168 hours. No results for input(s): AMMONIA in the last 168 hours.  CBC: Recent Labs  Lab  03/02/20 1510 03/03/20 0409  WBC 4.4 3.2*  NEUTROABS 3.5  --   HGB 8.3* 8.5*  HCT 26.2* 27.8*  MCV 103.1* 103.0*  PLT 78* 65*    Cardiac Enzymes: No results for input(s): CKTOTAL, CKMB, CKMBINDEX, TROPONINI in the last 168 hours.  BNP: BNP (last 3 results) Recent Labs    05/13/19 1005 12/02/19 0927  BNP 187.2* 169.9*    ProBNP (last 3 results) No results for input(s): PROBNP in the last 8760 hours.   CBG: No results for input(s): GLUCAP in the last 168 hours.  Coagulation Studies: Recent Labs    03/01/20 1051 03/02/20 1510 03/03/20 0409  LABPROT 31.7* 28.0* 28.3*  INR 3.2* 2.7* 2.8*     Imaging   DG CHEST PORT 1 VIEW  Result Date: 03/03/2020 CLINICAL DATA:  Increased lower extremity edema EXAM: PORTABLE CHEST 1 VIEW COMPARISON:  03/02/2020 FINDINGS: Cardiac shadow is enlarged but stable. Postsurgical changes are again seen and stable. Aortic calcifications are noted. Mild vascular congestion is noted with mild interstitial edema. No focal infiltrate is seen. No bony abnormality is noted. IMPRESSION: Changes of mild CHF. Electronically Signed   By: Inez Catalina M.D.   On: 03/03/2020 02:40   DG Chest Port 1 View  Result Date: 03/02/2020 CLINICAL DATA:  Hyponatremia, lower extremity edema, decreased renal function, short of breath EXAM: PORTABLE CHEST 1 VIEW COMPARISON:  01/09/2018 FINDINGS: Single frontal view of the chest demonstrates an enlarged cardiac silhouette. There is chronic central vascular congestion, with diffuse interstitial prominence likely reflecting an element of interstitial edema. There is also left basilar consolidation and small left pleural effusion, favor asymmetric edema over infection. No pneumothorax. No acute bony abnormalities. IMPRESSION: 1. Findings most consistent with congestive heart failure. Electronically Signed   By: Randa Ngo M.D.   On: 03/02/2020 17:20      Medications:     Current Medications: . allopurinol  100 mg  Oral Daily  . Chlorhexidine Gluconate Cloth  6 each Topical Q0600  . ezetimibe  10 mg Oral QHS   And  . simvastatin  20 mg Oral QHS  . finasteride  5 mg Oral QHS  . folic acid  3,664 mcg Oral Daily  . levothyroxine  75 mcg Oral QAC breakfast  . tamsulosin  0.4 mg Oral QPC supper  . vitamin B-12  1,000 mcg Oral QHS  . Warfarin - Pharmacist Dosing Inpatient   Does not apply q1600     Infusions: . sodium chloride    . sodium chloride        Assessment/Plan   1. Acute on Chronic Diastolic Heart Failure w/ Prominent RV Failure - Echo in 3/21  with EF 50-55%, mildly decreased RV systolic function, severe rheumatic mitral regurgitation, normal mechanical aortic valve. Severe MR likely plays a significant role in CHF and RV failure. NYHA class Illb-IV.  Now admitted w/ massive anasarca and failing high dose outpatient diuretics in the setting of progressive CKD, now nearing HD.  - Plan HD initiation today to help w/ volume removal. With RV failure, concern regarding his ability to tolerate iHD. Appreciate nephrology's assistance     2. Stage IV CKD - followed by [facility]. Baseline SCr 2.5-3.4 - Scr 3.01 today but BUN elevated at 116. K 3.1 - he is making urine but markedly fluid overloaded and will need HD for volume removal and uremia  - nephrology following and plans to start iHD today, he may have difficulty tolerating w/ RV failure   3. Chronic Afib  - currently in slow Afib 50s and hemodynamically stable  - in the setting of uremia, infection (UTI) and hypothyroidism (TSH 28) - electrolytes ok, HS trop 44 (likely demand ischemia)  - external pacing pads in place - hopefully will resolve w/ treatment of acute illness, plan HD + abx - monitor on tele   4. UTI  - UA+ UC pending. Has chronic foley. AF w/ nl WBC ct - abx per IM, getting cefepime   5. Chronic Anemia - IDA, likely 2/2 anemia of chronic disease from CKD  - Hgb 8.5 c/w baseline - recent Tsats 10%, plan dose of  feraheme today    6. Mechanical Aortic Valve - on coumadin. IRN goal 2.5-3.5 - INR 2.8 today  - coumadin dosing per pharmacy   7. CAD - s/p CABG - stable w/o ischemic symptoms   8. Hypothyroidism  - TSH markedly elevated at 28  - on levothyroxine at home - check Free T3/T4 - further management/ dose adjustment per IM    9. Severe MR:  - TEE 7/19 with severe MR with possible rheumatic MV (do not think MS is significant, mildly elevated mean gradient from high flow with severe MR).  Structural Heart team evaluated, not a Mitraclip candidate. Seen by Dr. [name], would be high risk for open surgery.  He was evaluated at [facility] in [place] for percutaneous mitral valve trials.  Unfortunately, he does not qualify for any of the available trials.  We will have to continue medical management.    Length of Stay: [place], PA-C  03/03/2020, 8:13 AM  Advanced Heart Failure Team Pager [phone] (M-F; 7a - 4p)  Please contact [facility] for night-coverage after hours (4p -7a ) and weekends on [email or web address]  Patient seen with PA, agree with the above note.   Mr Diiorio has been struggling at home with intractable volume overload and renal failure.  He has a near-normal EF but severe rheumatic MR and RV failure.  Not a candidate for any form of MV intervention (has had extensive workup).  He has been on high dose torsemide at home + metolazone and twice weekly IV Lasix.  Despite this, weight has risen and he has become progressively volume overloaded.    He was sent to the ER yesterday with Na down to 122 and cr > 3 with BUN > 100.   This morning, he is orthopneic and short of breath.  He is in atrial fibrillation chronically, rate has been slow at times here (in 40s).  SBP in 90s.  INR 2.8 (therapeutic with mechanical aortic valve).   General: NAD Neck: JVP 16+ cm, no thyromegaly or  thyroid nodule.  Lungs: Clear to auscultation bilaterally with normal respiratory effort. CV:  Nondisplaced PMI.  Heart irregular S1/S2, no S3/S4, 3/6 HSM apex.  2+ edema to thighs.  No carotid bruit.  Normal pedal pulses.  Abdomen: Soft, nontender, no hepatosplenomegaly, no distention.  Skin: Intact without lesions or rashes.  Neurologic: Alert and oriented x 3.  Psych: Normal affect. Extremities: No clubbing or cyanosis.  HEENT: Normal.   Valvular HF with severe, uncorrectable mitral regurgitation and RV failure.  He has been on high dose diuretics at home including intermittent IV Lasix. Despite this, he has marked volume overload.  Renal function has slowly worsened.  I have been concerned that he would eventually progress to the point where we would have to consider HD.  At this point, I think he has intractable volume overload and I doubt that we will be able to manage him with diuretics effectively any further.  I would favor a trial of RRT though not sure how well he will tolerate with the severe MR and RV failure.  - Nephrology following, plan for HD catheter in IR.  SBP in 90s currently, he may need initial CVVH.  I will stop Coreg and start midodrine 5 mg tid.  - He ate breakfast so catheter may be delayed to this afternoon.  Given dyspnea/orthopnea, will give a dose of Lasix 160 mg IV x 1 now.   He is in atrial fibrillation, rate has dropped to the 40s at times but comes back up.  Also noted short AIVR runs.  Suspect this is related to electrolyte imbalance and hopefully will improve with HD.  Hold Coreg.   Hypervolemic hyponatremia.  Na was down as low as 122, now 124.   - Fluid restrict.  - HD.   Suspect UTI, started on cefepime.   Has mechanical aortic valve, goal INR 2.5 - 3.5.   Loralie Champagne 03/03/2020 10:55 AM

## 2020-03-03 NOTE — Progress Notes (Signed)
Alerted Dr. Erin Fulling of pt's K=2.9. Awaiting orders.

## 2020-03-03 NOTE — Plan of Care (Signed)
  Problem: Nutrition Goal: Patient maintains adequate hydration Outcome: Not Progressing Goal: Patient maintains weight Outcome: Progressing Goal: Patient/Family demonstrates understanding of diet Outcome: Not Progressing   Problem: Clinical Measurements: Goal: Complications related to the disease process or treatment will be avoided or minimized Outcome: Progressing Goal: Dialysis access will remain free of complications Outcome: Progressing   Problem: Urinary Elimination: Goal: Progression of disease will be identified and treated Outcome: Progressing

## 2020-03-03 NOTE — ED Notes (Signed)
Report given to [place], [place] on 6E

## 2020-03-03 NOTE — Consult Note (Signed)
NAME:  [name], MRN:  [id number], DOB:  [birth year], LOS: 1 ADMISSION DATE:  03/02/2020, CONSULTATION DATE:  03/03/2020 REFERRING MD:  Dr. Kristopher Oppenheim, CHIEF COMPLAINT:  Cardiorenal syndrome   History of present illness   84 year old male presents to ED on 11/30 with progressive shortness of breath and lower extremity edema with CHF and Severe MR with progressive renal failure. Has been followed by Heart Failure and Evaluated by CTS. Determined not a candidate for MitraClip. High risk for conventional surgery. Recommended aggressive medical management.   On arrival to ED patient is massively volume overloaded with poor response to outpatient diuretics. Crt 3.3. BUN 116. INR 2.8. Nephrology and Heart Failure Consulted. Attempted High dose diuretics overnight. Complicated also by hypotension. 12/1 decision made to transfer to ICU for CRRT.   Goals of care conversation with patient. States he is a DNR/DNI however wants to try dialysis, if it does not work would be amendable to comfort.   Past Medical History  Chronic Combined CHF, Severe Mitral Regurgitation, H/O mechanical aortic valve replacement, A.Fib, CAD s/p CABG, CKD stage IV (baseline Crt 2.5-3), ITP anemia  Significant Hospital Events   11/30 > Admit to Hospital  12/1 > Transfer to ICU for CRRT   Consults:  Heart Failure Nephrology  PCCM  Procedures:  Right IJ Vascath 12/1 >>  Significant Diagnostic Tests:  CXR 12/1 > Cardiac shadow is enlarged but stable. Postsurgical changes are again seen and stable. Aortic calcifications are noted. Mild vascular congestion is noted with mild interstitial edema. No focal infiltrate is seen. No bony abnormality is noted.  Micro Data:  Urine Culture 12/1 >>   Antimicrobials:  Cefepime x 1 dose 12/1   Interim history/subjective:  As above   Objective   Blood pressure (!) 87/68, pulse 60, temperature 97.7 F (36.5 C), temperature source Oral, resp. rate 12, SpO2 100 %.         Intake/Output Summary (Last 24 hours) at 03/03/2020 1231 Last data filed at 03/03/2020 1136 Gross per 24 hour  Intake 100 ml  Output 2000 ml  Net -1900 ml   There were no vitals filed for this visit.  Examination: General: Elderly male, no distress  HENT: DRY mm  Lungs: Crackles to bases, no use of accessory muscles Cardiovascular: irregular, brady  Abdomen: obese, active bowel sounds  Extremities: +3 BLE edema Neuro: alert, oriented, follows commands  GU: intact   Resolved Hospital Problem list     Assessment & Plan:   Acute on Chronic Heart Failure with severe volume overload and RV Failure with hypotension  Severe MR H/O AVR CAD s/p CABG -Not a candidate for Mitral Clip, not a surgical candidate per CTS. Evaluated at [facility] for percutaneous mitral valve trial however denied  Plan -Heart Failure following  -Midodrine added this AM -Plan for Volume removal with CRRT  Stage IV CKD (baseline Crt 2.5-3.4)  Cardiorenal Syndrome  Plan  -Nephrology Following  -Plans to start CRRT  -Trend BMP  UTI  Plan -Follow Culture Data -Continue Cefepime  A.Fib on Coumadin Plan -Continue Coumadin, Pharmacy to Manage  -Holding amiodarone and coreg in setting of current bradycardia    COPD OSA on CPAP at HS  Plan -Titrate Supplemental Oxygen to maintain saturation >90 -PRN Nebs -CPAP at HS   DM  Plan -Trend Glucose -SSI  ITP Plan -Trend CBC    Best practice (evaluated daily)   Diet: Low Salt Diet  DVT prophylaxis: Coumadin as above GI prophylaxis: N/A Glucose  control: SSI Mobility: Bedrest  last date of multidisciplinary goals of care discussion Family and staff present  Summary of discussion  Follow up goals of care discussion due Code Status: DNR Disposition: Transfer to ICU   Labs   CBC: Recent Labs  Lab 03/02/20 1510 03/03/20 0409  WBC 4.4 3.2*  NEUTROABS 3.5  --   HGB 8.3* 8.5*  HCT 26.2* 27.8*  MCV 103.1* 103.0*  PLT 78* 65*    Basic  Metabolic Panel: Recent Labs  Lab 03/01/20 1051 03/02/20 1510 03/03/20 0409  NA 122* 124* 124*  K 3.8 3.9 3.1*  CL 86* 85* 85*  CO2 23 24 24   GLUCOSE 115* 118* 112*  BUN 111* 116* 114*  CREATININE 3.32* 3.29* 3.01*  CALCIUM 8.3* 8.6* 8.6*  PHOS  --   --  5.5*   GFR: Estimated Creatinine Clearance: 26.3 mL/min (A) (by C-G formula based on SCr of 3.01 mg/dL (H)). Recent Labs  Lab 03/02/20 1510 03/03/20 0409  WBC 4.4 3.2*    Liver Function Tests: Recent Labs  Lab 03/02/20 1510 03/03/20 0409  AST 28 24  ALT 20 18  ALKPHOS 136* 118  BILITOT 1.1 1.3*  PROT 6.4* 6.0*  ALBUMIN 3.2* 3.1*   No results for input(s): LIPASE, AMYLASE in the last 168 hours. No results for input(s): AMMONIA in the last 168 hours.  ABG    Component Value Date/Time   PHART 7.321 (L) 12/30/2008 1303   PCO2ART 48.5 (H) 12/30/2008 1303   PO2ART 296.0 (H) 12/30/2008 1303   HCO3 27.0 04/30/2018 1212   TCO2 28 04/30/2018 1212   ACIDBASEDEF 1.0 12/30/2008 1303   O2SAT 76.0 04/30/2018 1212     Coagulation Profile: Recent Labs  Lab 03/01/20 1051 03/02/20 1510 03/03/20 0409  INR 3.2* 2.7* 2.8*    Cardiac Enzymes: No results for input(s): CKTOTAL, CKMB, CKMBINDEX, TROPONINI in the last 168 hours.  HbA1C: Hgb A1c MFr Bld  Date/Time Value Ref Range Status  03/25/2019 09:53 AM 5.6 4.6 - 6.5 % Final    Comment:    Glycemic Control Guidelines for People with Diabetes:Non Diabetic:  <6%Goal of Therapy: <7%Additional Action Suggested:  >8%   07/01/2018 09:28 AM 5.7 4.6 - 6.5 % Final    Comment:    Glycemic Control Guidelines for People with Diabetes:Non Diabetic:  <6%Goal of Therapy: <7%Additional Action Suggested:  >8%     CBG: No results for input(s): GLUCAP in the last 168 hours.  Review of Systems:   Negative Cough +SOB, +LE Edema, +Weakness   Past Medical History  He,  has a past medical history of Carotid artery disease ([place]) (1994), Chronic atrial fibrillation ([place]), Chronic  diastolic CHF (congestive heart failure) ([place]), Diabetes mellitus without complication ([place]), Dyspnea, Epistaxis, Heart murmur, CABG, Hypertension, OSA (obstructive sleep apnea), Rheumatic fever, S/P AVR (aortic valve replacement), Subclavian bypass stenosis ([facility]), Temporary low platelet count ([place]), Urinary catheter in place (12/2019), and Vitamin B12 deficiency.   Surgical History    Past Surgical History:  Procedure Laterality Date  . artificial valve    . [place]   replaced due to aortic stenosis, [employer] mechanical prostesis  . CAROTID ENDARTERECTOMY Left 1997   subclavian bypass Done in [place]  . CORONARY ARTERY BYPASS GRAFT  1994   w SVG to RCA and SVG to circumflex  . heart bypass     Done in [place]  . MULTIPLE EXTRACTIONS WITH ALVEOLOPLASTY Bilateral 12/12/2017   Procedure: Extraction of tooth #'s  1, 12,13,14,15,17,18,19, 29, and 30 with alveoloplasty and gross debridement of remaining teeth`;  Surgeon: Lenn Cal, DDS;  Location: [facility];  Service: Oral Surgery;  Laterality: Bilateral;  MULTIPLE EXTRACTION WITH ALVEOLOPLASTY WITH PRE PROSTHETIC SURGERY AND GROSS DEBRIDEMENT OF REMAINING TEETH  . RIGHT HEART CATH AND CORONARY/GRAFT ANGIOGRAPHY N/A 04/30/2018   Procedure: RIGHT HEART CATH AND CORONARY/GRAFT ANGIOGRAPHY;  Surgeon: Larey Dresser, MD;  Location: [facility];  Service: Cardiovascular;  Laterality: N/A;  . TEE WITHOUT CARDIOVERSION N/A 10/23/2017   Procedure: TRANSESOPHAGEAL ECHOCARDIOGRAM (TEE);  Surgeon: Larey Dresser, MD;  Location[facility];  Service: Cardiovascular;  Laterality: N/A;  . TONSILLECTOMY       Social History   reports that he quit smoking about 27 years ago. His smoking use included cigarettes. He has a 30.00 pack-year smoking history. He has never used smokeless tobacco. He reports current alcohol use. He reports that he does not use drugs.   Family History   His family history includes Cancer in his  father; Hypertension in his mother.   Allergies Allergies  Allergen Reactions  . Rifampin Rash and Other (See Comments)    May have been caused by Vancomycin or Rifampin (??)  . Vancomycin Rash and Other (See Comments)    May have been caused by Vancomycin or Rifampin (??)     Home Medications  Prior to Admission medications   Medication Sig Start Date End Date Taking? Authorizing Provider  acetaminophen (TYLENOL) 500 MG tablet Take 1,000 mg by mouth every 6 (six) hours as needed for moderate pain or headache.   Yes [provider]  albuterol (VENTOLIN HFA) 108 (90 Base) MCG/ACT inhaler INHALE 2 PUFFS INTO THE LUNGS EVERY 4 (FOUR) HOURS AS NEEDED FOR WHEEZING OR SHORTNESS OF BREATH. 02/25/20 02/24/21 Yes [name], [name], MD  allopurinol (ZYLOPRIM) 100 MG tablet TAKE 1 TABLET BY MOUTH EVERY DAY Patient taking differently: Take 100 mg by mouth daily.  09/02/19  Yes Larey Dresser, MD  amiodarone (PACERONE) 200 MG tablet TAKE 1 TABLET BY MOUTH EVERY DAY Patient taking differently: Take 200 mg by mouth daily.  03/01/20  Yes Clegg, Amy D, NP  carvedilol (COREG) 3.125 MG tablet Take 1 tablet (3.125 mg total) by mouth 2 (two) times daily. 08/04/19  Yes Larey Dresser, MD  cholecalciferol (VITAMIN D3) 25 MCG (1000 UNIT) tablet Take 1,000 Units by mouth at bedtime.   Yes [provider]  ezetimibe-simvastatin (VYTORIN) 10-20 MG tablet TAKE 1 TABLET BY MOUTH EVERYDAY AT BEDTIME Patient taking differently: Take 1 tablet by mouth at bedtime.  07/28/19  Yes [name], [name], MD  finasteride (PROSCAR) 5 MG tablet Take 5 mg by mouth at bedtime.    Yes [provider]  folic acid (FOLVITE) 474 MCG tablet Take 400 mcg by mouth at bedtime.   Yes [provider]  levothyroxine (SYNTHROID) 50 MCG tablet Take 1.5 tablets (75 mcg total) by mouth daily before breakfast. 10/14/19  Yes Larey Dresser, MD  LOKELMA 10 g PACK packet Take 1 packet by mouth See admin  instructions. 1 time dose 02/24/20  Yes [provider]  metolazone (ZAROXOLYN) 5 MG tablet Take 1 tablet (5 mg total) by mouth 3 (three) times a week. Tuesdaym, Thursday and Saturday 02/09/20  Yes Larey Dresser, MD  polyethylene glycol powder (GLYCOLAX/MIRALAX) powder Take 17 g by mouth daily as needed for moderate constipation.   Yes [provider]  tamsulosin (FLOMAX) 0.4 MG CAPS  capsule Take 0.4 mg by mouth daily after supper.    Yes [provider]  torsemide (DEMADEX) 100 MG tablet Take 1 tablet (100 mg total) by mouth 2 (two) times daily. 02/02/20  Yes Larey Dresser, MD  triamcinolone ointment (KENALOG) 0.1 % Apply 1 application topically 2 (two) times daily. Patient taking differently: Apply 1 application topically daily as needed (rash).  12/25/18  Yes [name], [name], MD  vitamin B-12 (CYANOCOBALAMIN) 1000 MCG tablet Take 1,000 mcg by mouth at bedtime.   Yes [provider]  Vitamins A & D (VITAMIN A & D) ointment Apply 1 application topically as needed (blisters).   Yes [provider]  warfarin (COUMADIN) 5 MG tablet TAKE  AS DIRECTED BY COUMADIN CLINIC Patient taking differently: Take 5 mg by mouth See admin instructions. 1 tablet(5mg )on Saturday and Thursday  1/2 tablet(2.5mg ) Monday,tuesday,wednesday,friday,sunday 10/01/19  Yes Belva Crome, MD     Critical care time: 35 minutes     Hayden Pedro, AGACNP-BC [facility]  Pgr: [phone]  PCCM Pgr: [phone]

## 2020-03-03 NOTE — Progress Notes (Signed)
Pharmacy Antibiotic Note  Silvestre Hazard is a 84 y.o. male admitted on 03/02/2020 with UTI.  Pharmacy has been consulted for cefepime dosing.  Of note, pt w/ CKDIV with plan to trial HD; nephrology believes pt may not tolerate HD but is not a PD candidate.  Plan: Cefepime 2g IV x1 and monitor HD tolerance for redosing.   Temp (24hrs), Avg:97.3 F (36.3 C), Min:97.3 F (36.3 C), Max:97.3 F (36.3 C)  Recent Labs  Lab 02/25/20 1055 03/01/20 1051 03/02/20 1510  WBC  --   --  4.4  CREATININE 3.23* 3.32* 3.29*    Estimated Creatinine Clearance: 24.1 mL/min (A) (by C-G formula based on SCr of 3.29 mg/dL (H)).    Allergies  Allergen Reactions  . Rifampin Rash and Other (See Comments)    May have been caused by Vancomycin or Rifampin (??)  . Vancomycin Rash and Other (See Comments)    May have been caused by Vancomycin or Rifampin (??)     Thank you for allowing pharmacy to be a part of this patient's care.  Wynona Neat, PharmD, BCPS  03/03/2020 4:32 AM

## 2020-03-03 NOTE — Progress Notes (Addendum)
S:Patient reports breathing is doing okay. He has not had access placed yet. He continues to have diffuse swelling in his abdomen and legs. Discussed plan to do HD today and he expressed understanding.   O:BP 99/76   Pulse 66   Temp 97.7 F (36.5 C) (Oral)   Resp 18   SpO2 99%   Intake/Output Summary (Last 24 hours) at 03/03/2020 0939 Last data filed at 03/03/2020 0451 Gross per 24 hour  Intake 100 ml  Output 1100 ml  Net -1000 ml   Intake/Output: I/O last 3 completed shifts: In: 100 [IV Piggyback:100] Out: 1100 [Urine:1100]  Intake/Output this shift:  No intake/output data recorded. Weight change:  Gen: Elderly male, chronically ill appearing, NAD, laying in bed CVS: Irregular rhythm, no m/r/g Resp: Decreased breath sounds, on 2L [place], normal work of breathing Abd: Pitting edema on abdomen, non-tender, normoactive BS Ext: 3+ BL pitting edema up to knees, Unna boots in place.   Recent Labs  Lab 02/25/20 1055 03/01/20 1051 03/02/20 1510 03/03/20 0409  NA 124* 122* 124* 124*  K 3.9 3.8 3.9 3.1*  CL 89* 86* 85* 85*  CO2 22 23 24 24   GLUCOSE 147* 115* 118* 112*  BUN 104* 111* 116* 114*  CREATININE 3.23* 3.32* 3.29* 3.01*  ALBUMIN  --   --  3.2* 3.1*  CALCIUM 8.3* 8.3* 8.6* 8.6*  PHOS  --   --   --  5.5*  AST  --   --  28 24  ALT  --   --  20 18   Liver Function Tests: Recent Labs  Lab 03/02/20 1510 03/03/20 0409  AST 28 24  ALT 20 18  ALKPHOS 136* 118  BILITOT 1.1 1.3*  PROT 6.4* 6.0*  ALBUMIN 3.2* 3.1*   No results for input(s): LIPASE, AMYLASE in the last 168 hours. No results for input(s): AMMONIA in the last 168 hours. CBC: Recent Labs  Lab 03/02/20 1510 03/03/20 0409  WBC 4.4 3.2*  NEUTROABS 3.5  --   HGB 8.3* 8.5*  HCT 26.2* 27.8*  MCV 103.1* 103.0*  PLT 78* 65*   Cardiac Enzymes: No results for input(s): CKTOTAL, CKMB, CKMBINDEX, TROPONINI in the last 168 hours. CBG: No results for input(s): GLUCAP in the last 168 hours.  Iron Studies: No  results for input(s): IRON, TIBC, TRANSFERRIN, FERRITIN in the last 72 hours. Studies/Results: DG CHEST PORT 1 VIEW  Result Date: 03/03/2020 CLINICAL DATA:  Increased lower extremity edema EXAM: PORTABLE CHEST 1 VIEW COMPARISON:  03/02/2020 FINDINGS: Cardiac shadow is enlarged but stable. Postsurgical changes are again seen and stable. Aortic calcifications are noted. Mild vascular congestion is noted with mild interstitial edema. No focal infiltrate is seen. No bony abnormality is noted. IMPRESSION: Changes of mild CHF. Electronically Signed   By: Inez Catalina M.D.   On: 03/03/2020 02:40   DG Chest Port 1 View  Result Date: 03/02/2020 CLINICAL DATA:  Hyponatremia, lower extremity edema, decreased renal function, short of breath EXAM: PORTABLE CHEST 1 VIEW COMPARISON:  01/09/2018 FINDINGS: Single frontal view of the chest demonstrates an enlarged cardiac silhouette. There is chronic central vascular congestion, with diffuse interstitial prominence likely reflecting an element of interstitial edema. There is also left basilar consolidation and small left pleural effusion, favor asymmetric edema over infection. No pneumothorax. No acute bony abnormalities. IMPRESSION: 1. Findings most consistent with congestive heart failure. Electronically Signed   By: Randa Ngo M.D.   On: 03/02/2020 17:20   . allopurinol  100  mg Oral Daily  . Chlorhexidine Gluconate Cloth  6 each Topical Q0600  . ezetimibe  10 mg Oral QHS   And  . simvastatin  20 mg Oral QHS  . finasteride  5 mg Oral QHS  . folic acid  3,716 mcg Oral Daily  . levothyroxine  75 mcg Oral QAC breakfast  . tamsulosin  0.4 mg Oral QPC supper  . vitamin B-12  1,000 mcg Oral QHS  . warfarin  2.5 mg Oral ONCE-1600  . Warfarin - Pharmacist Dosing Inpatient   Does not apply q1600    BMET    Component Value Date/Time   NA 124 (L) 03/03/2020 0409   NA 138 07/01/2019 0900   NA 140 01/19/2017 0827   K 3.1 (L) 03/03/2020 0409   K 4.8 01/19/2017  0827   CL 85 (L) 03/03/2020 0409   CO2 24 03/03/2020 0409   CO2 24 01/19/2017 0827   GLUCOSE 112 (H) 03/03/2020 0409   GLUCOSE 103 01/19/2017 0827   BUN 114 (H) 03/03/2020 0409   BUN 87 (HH) 07/01/2019 0900   BUN 28.4 (H) 01/19/2017 0827   CREATININE 3.01 (H) 03/03/2020 0409   CREATININE 3.05 (HH) 11/12/2019 0820   CREATININE 1.2 01/19/2017 0827   CALCIUM 8.6 (L) 03/03/2020 0409   CALCIUM 9.7 01/19/2017 0827   GFRNONAA 20 (L) 03/03/2020 0409   GFRNONAA 18 (L) 11/12/2019 0820   GFRAA 20 (L) 12/02/2019 0927   GFRAA 21 (L) 11/12/2019 0820   CBC    Component Value Date/Time   WBC 3.2 (L) 03/03/2020 0409   RBC 2.70 (L) 03/03/2020 0409   HGB 8.5 (L) 03/03/2020 0409   HGB 9.3 (L) 08/01/2017 1136   HGB 11.6 (L) 03/30/2017 1147   HGB 12.0 (L) 01/19/2017 0827   HCT 27.8 (L) 03/03/2020 0409   HCT 31.1 (L) 08/20/2019 1105   HCT 37.8 (L) 01/19/2017 0827   PLT 65 (L) 03/03/2020 0409   PLT 56 (L) 08/01/2017 1136   PLT 59 (LL) 03/30/2017 1147   MCV 103.0 (H) 03/03/2020 0409   MCV 106 (H) 03/30/2017 1147   MCV 113.9 (H) 01/19/2017 0827   MCH 31.5 03/03/2020 0409   MCHC 30.6 03/03/2020 0409   RDW 18.4 (H) 03/03/2020 0409   RDW 14.5 03/30/2017 1147   RDW 16.2 (H) 01/19/2017 0827   LYMPHSABS 0.5 (L) 03/02/2020 1510   LYMPHSABS 0.8 (L) 01/19/2017 0827   MONOABS 0.4 03/02/2020 1510   MONOABS 0.3 01/19/2017 0827   EOSABS 0.0 03/02/2020 1510   EOSABS 0.1 01/19/2017 0827   BASOSABS 0.0 03/02/2020 1510   BASOSABS 0.0 01/19/2017 0827   This is a 84 year old male with a history of mechanical AVR, OSA on CPAP, Afib, chronic coumadin, CHF (EF 40-45%), severe MR, CKDIV (BL cr 2.5-3.4 range), CASH s/p CABG 1994, left CEA, ITP, DM, COPD, BPH with indwelling foley who presented with worsening swelling, weight gain, decreased mobility. Noted to be significantly volume overloaded despite aggressive diuresis at home.  Assessment/Plan:  1. CKDIV: BL Cr 2.5-3.4, Cr on admission 3.29. Patient has a  history of nephrosclerosis and cardiorenal syndrome and is followed by Dr. Johnney Ou at clinic. Patient arrived significantly volume overloaded, about 45 lbs up from dry weight of 218. Failed aggressive diuresis at home.   -Temporary HD catheter placement today, plan for RRT for volume removal -Patient will need tunneled HD cath prior to discharge -Patient may not tolerate HD, BP has been on the low side and patient has  a history of severe MR and CHF with reduced EF. Also noted to have arrhythmias. Patient should be transferred to ICU for CRRT.  -Overall he may not do well on dialysis for the long term, he has advanced age, poor functional status, and NYHA class IV. Does follow with palliative care at home.  2. Hypokalemia: K 3.1 today, may be contributing to arrhythmias, will replete as needed and going for dialysis today.  Will use 4K bath with CRRT today 3. Renal osteodystrophy: Phos 5.5, PTH pending 4. Anemia - Patient has history of ITP, Hgb today 8.5. Feraheme today. 5. CHF - Advanced heart failure following.  6. Mechanical AVR on coumadin goal INR 2.5-3.5 7. Afib 8. CASHD s/p CABG and left CEA 9. COPD 10. DM 11. ITP  Asencion Noble, M.D. PGY3 Pager [phone] 03/03/2020 9:39 AM  I have seen and examined this patient and agree with plan and assessment in the above note with renal recommendations/intervention highlighted. I personally interviewed and examined Mr. Moeller and helped develop the above assessment and plan with Dr. Sherry Ruffing.  Mr. Chiquito is a marginal dialysis candidate, however he had discussed attempting dialysis with his primary nephrologist in our office.  He is too unstable for IHD and is markedly volume overloaded.  Will transfer to ICu and initiate CRRT today and UF as BP tolerates.  Per records he is 45 lbs above his estimated dry weight.  Will likely need CRRT for several days and follow BP response to UF.  If he does not tolerate IHD will need Palliative care  involvement. Governor Rooks Jahmiya Guidotti,MD 03/03/2020 2:49 PM

## 2020-03-03 NOTE — ED Notes (Addendum)
Pt coughing more frequently, new bloody mucous noted. MD [facility] aware.

## 2020-03-03 NOTE — ED Notes (Signed)
Pt to interventional radiology for HD cath placement

## 2020-03-03 NOTE — Progress Notes (Signed)
Pharmacy Antibiotic Note  Abhi Amend is a 84 y.o. male admitted on 03/02/2020 with UTI.  Pharmacy has been consulted for Cefepime dosing.      Temp (24hrs), Avg:97.6 F (36.4 C), Min:97.3 F (36.3 C), Max:97.7 F (36.5 C)  Recent Labs  Lab 03/01/20 1051 03/02/20 1510 03/03/20 0409 03/03/20 0609  WBC  --  4.4 3.2*  --   CREATININE 3.32* 3.29* 3.01* 2.92*    Estimated Creatinine Clearance: 27.1 mL/min (A) (by C-G formula based on SCr of 2.92 mg/dL (H)).    Allergies  Allergen Reactions  . Rifampin Rash and Other (See Comments)    May have been caused by Vancomycin or Rifampin (??)  . Vancomycin Rash and Other (See Comments)    May have been caused by Vancomycin or Rifampin (??)    Antimicrobials this admission: 12/1 Cefepime >>   Dose adjustments this admission: N/a  Microbiology results: Pending   Plan:  - Cefepime 1g IV q24h - Monitor patients renal function and urine output  - De-escalate ABX when appropriate   Thank you for allowing pharmacy to be a part of this patient's care.  Duanne Limerick PharmD. BCPS 03/03/2020 1:50 PM

## 2020-03-03 NOTE — Progress Notes (Signed)
RT note.  Pt. Placed on auto titrate 20/5 with 3L in line. VS stable and pt. Resting comfortable. RT will continue to monitor.

## 2020-03-03 NOTE — Progress Notes (Signed)
[facility] Progress Note Patient Name: [name] DOB: 02/23/1936 MRN: [id number]   Date of Service  03/03/2020  HPI/Events of Note  Hypotension - BP = 78/37 with MAP = 50.   eICU Interventions  Plan: 1. Phenylephrine IV infusion. Titrate to MAP >= 65.     Intervention Category Major Interventions: Hypotension - evaluation and management  Lysle Dingwall 03/03/2020, 9:42 PM

## 2020-03-03 NOTE — ED Notes (Signed)
Pacer pads placed on pt per MD request.

## 2020-03-03 NOTE — ED Notes (Signed)
Pt's O2 noted to be 86% RA; pt placed on 2L [place], O2 sats improved to 94%

## 2020-03-03 NOTE — Procedures (Signed)
Interventional Radiology Procedure:   Indications: CKD stage 4.  Request for dialysis catheter  Procedure: Non-tunneled dialysis catheter placement  Findings: Right jugular catheter, tip at SVC/RA junction  Complications: None     EBL: less than 5 ml  Plan: Catheter is ready to use.     Estera Ozier R. Anselm Pancoast, MD  Pager: [phone]

## 2020-03-03 NOTE — Progress Notes (Signed)
ANTICOAGULATION CONSULT NOTE   Pharmacy Consult for Warfarin Indication: Mechanical AVR  Allergies  Allergen Reactions  . Rifampin Rash and Other (See Comments)    May have been caused by Vancomycin or Rifampin (??)  . Vancomycin Rash and Other (See Comments)    May have been caused by Vancomycin or Rifampin (??)    Patient Measurements:   Heparin Dosing Weight:   Vital Signs: BP: 121/52 (12/01 0841) Pulse Rate: 50 (12/01 0841)  Labs: Recent Labs    03/01/20 1051 03/02/20 1510 03/03/20 0409  HGB  --  8.3* 8.5*  HCT  --  26.2* 27.8*  PLT  --  78* 65*  LABPROT 31.7* 28.0* 28.3*  INR 3.2* 2.7* 2.8*  CREATININE 3.32* 3.29* 3.01*  TROPONINIHS  --   --  44*    Estimated Creatinine Clearance: 26.3 mL/min (A) (by C-G formula based on SCr of 3.01 mg/dL (H)).   Medical History: Past Medical History:  Diagnosis Date  . Carotid artery disease ([place]) 1994   s/p left carotid endarerectomy   . Chronic atrial fibrillation (HCC)    a. on coumadin   . Chronic diastolic CHF (congestive heart failure) ([place])   . Diabetes mellitus without complication ([place])    dx 2016  . Dyspnea   . Epistaxis   . Heart murmur   . Hx of CABG    a. 1994  . Hypertension   . OSA (obstructive sleep apnea)   . Rheumatic fever   . S/P AVR (aortic valve replacement)    a. mechanical valve 1996  . Subclavian bypass stenosis ([place])   . 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 Urinary catheter in place 12/2019  . Vitamin B12 deficiency     Medications:  Scheduled:  . allopurinol  100 mg Oral Daily  . Chlorhexidine Gluconate Cloth  6 each Topical Q0600  . ezetimibe  10 mg Oral QHS   And  . simvastatin  20 mg Oral QHS  . finasteride  5 mg Oral QHS  . folic acid  2,951 mcg Oral Daily  . levothyroxine  75 mcg Oral QAC breakfast  . tamsulosin  0.4 mg Oral QPC supper  . vitamin B-12  1,000 mcg Oral QHS  . Warfarin - Pharmacist Dosing  Inpatient   Does not apply q1600    Assessment: 21 yom that is being admitted for SOB after fluid overload in the setting of CKD. Patient is on warfarin at home for a mechanical AVR. INR today therapeutic at 2.8, CBC is low but stable.  PTA Regimen: Warfarin 5mg  Sat and Tues and 2.5mg  ROW  Goal of Therapy:  INR 2.5-3.5 Monitor platelets by anticoagulation protocol: Yes   Plan:  -Warfarin 2.5mg  x1 tonight -Daily INR, CBC   Arrie Senate, PharmD, BCPS, [facility] Clinical Pharmacist [phone] Please check AMION for all [facility]acy numbers 03/03/2020

## 2020-03-04 DIAGNOSIS — I5033 Acute on chronic diastolic (congestive) heart failure: Secondary | ICD-10-CM | POA: Diagnosis not present

## 2020-03-04 DIAGNOSIS — N179 Acute kidney failure, unspecified: Secondary | ICD-10-CM | POA: Diagnosis not present

## 2020-03-04 DIAGNOSIS — R601 Generalized edema: Secondary | ICD-10-CM | POA: Diagnosis not present

## 2020-03-04 LAB — BASIC METABOLIC PANEL
Anion gap: 12 (ref 5–15)
BUN: 82 mg/dL — ABNORMAL HIGH (ref 8–23)
CO2: 25 mmol/L (ref 22–32)
Calcium: 8.3 mg/dL — ABNORMAL LOW (ref 8.9–10.3)
Chloride: 92 mmol/L — ABNORMAL LOW (ref 98–111)
Creatinine, Ser: 2.23 mg/dL — ABNORMAL HIGH (ref 0.61–1.24)
GFR, Estimated: 29 mL/min — ABNORMAL LOW (ref >60)
Glucose, Bld: 107 mg/dL — ABNORMAL HIGH (ref 70–99)
Potassium: 4.5 mmol/L (ref 3.5–5.1)
Sodium: 129 mmol/L — ABNORMAL LOW (ref 135–145)

## 2020-03-04 LAB — RENAL FUNCTION PANEL
Albumin: 3 g/dL — ABNORMAL LOW (ref 3.5–5.0)
Albumin: 3.3 g/dL — ABNORMAL LOW (ref 3.5–5.0)
Anion gap: 14 (ref 5–15)
Anion gap: 13 (ref 5–15)
BUN: 70 mg/dL — ABNORMAL HIGH (ref 8–23)
BUN: 47 mg/dL — ABNORMAL HIGH (ref 8–23)
CO2: 25 mmol/L (ref 22–32)
CO2: 25 mmol/L (ref 22–32)
Calcium: 8.9 mg/dL (ref 8.9–10.3)
Calcium: 8.8 mg/dL — ABNORMAL LOW (ref 8.9–10.3)
Chloride: 91 mmol/L — ABNORMAL LOW (ref 98–111)
Chloride: 94 mmol/L — ABNORMAL LOW (ref 98–111)
Creatinine, Ser: 1.93 mg/dL — ABNORMAL HIGH (ref 0.61–1.24)
Creatinine, Ser: 1.63 mg/dL — ABNORMAL HIGH (ref 0.61–1.24)
GFR, Estimated: 34 mL/min — ABNORMAL LOW (ref >60)
GFR, Estimated: 42 mL/min — ABNORMAL LOW (ref >60)
Glucose, Bld: 92 mg/dL (ref 70–99)
Glucose, Bld: 133 mg/dL — ABNORMAL HIGH (ref 70–99)
Phosphorus: 3.6 mg/dL (ref 2.5–4.6)
Phosphorus: 2.9 mg/dL (ref 2.5–4.6)
Potassium: 3.2 mmol/L — ABNORMAL LOW (ref 3.5–5.1)
Potassium: 4.1 mmol/L (ref 3.5–5.1)
Sodium: 130 mmol/L — ABNORMAL LOW (ref 135–145)
Sodium: 132 mmol/L — ABNORMAL LOW (ref 135–145)

## 2020-03-04 LAB — GLUCOSE, CAPILLARY
Glucose-Capillary: 73 mg/dL (ref 70–99)
Glucose-Capillary: 107 mg/dL — ABNORMAL HIGH (ref 70–99)
Glucose-Capillary: 169 mg/dL — ABNORMAL HIGH (ref 70–99)
Glucose-Capillary: 154 mg/dL — ABNORMAL HIGH (ref 70–99)

## 2020-03-04 LAB — PARATHYROID HORMONE, INTACT (NO CA): PTH: 58 pg/mL (ref 15–65)

## 2020-03-04 LAB — CBC
HCT: 24.8 % — ABNORMAL LOW (ref 39.0–52.0)
Hemoglobin: 7.6 g/dL — ABNORMAL LOW (ref 13.0–17.0)
MCH: 31.4 pg (ref 26.0–34.0)
MCHC: 30.6 g/dL (ref 30.0–36.0)
MCV: 102.5 fL — ABNORMAL HIGH (ref 80.0–100.0)
Platelets: 109 10*3/uL — ABNORMAL LOW (ref 150–400)
RBC: 2.42 MIL/uL — ABNORMAL LOW (ref 4.22–5.81)
RDW: 18.3 % — ABNORMAL HIGH (ref 11.5–15.5)
WBC: 5.4 10*3/uL (ref 4.0–10.5)
nRBC: 1.1 % — ABNORMAL HIGH (ref 0.0–0.2)

## 2020-03-04 LAB — MAGNESIUM: Magnesium: 2.5 mg/dL — ABNORMAL HIGH (ref 1.7–2.4)

## 2020-03-04 LAB — MRSA PCR SCREENING: MRSA by PCR: NEGATIVE

## 2020-03-04 LAB — T3: T3, Total: 45 ng/dL — ABNORMAL LOW (ref 71–180)

## 2020-03-04 LAB — PROTIME-INR
INR: 3.1 — ABNORMAL HIGH (ref 0.8–1.2)
Prothrombin Time: 31.1 seconds — ABNORMAL HIGH (ref 11.4–15.2)

## 2020-03-04 MED ORDER — NOREPINEPHRINE 16 MG/250ML-% IV SOLN
0.0000 ug/min | INTRAVENOUS | Status: DC
  Administered 2020-03-05: 19.5 ug/min via INTRAVENOUS
  Administered 2020-03-05: 20 ug/min via INTRAVENOUS
  Administered 2020-03-06: 03:00:00 32 ug/min via INTRAVENOUS
  Administered 2020-03-06 – 2020-03-07 (×2): 26 ug/min via INTRAVENOUS
  Administered 2020-03-07: 04:00:00 22 ug/min via INTRAVENOUS
  Administered 2020-03-08 (×2): 20 ug/min via INTRAVENOUS
  Administered 2020-03-09: 21 ug/min via INTRAVENOUS
  Administered 2020-03-10: 04:00:00 17 ug/min via INTRAVENOUS
  Administered 2020-03-11: 23:00:00 28 ug/min via INTRAVENOUS
  Administered 2020-03-11: 10 ug/min via INTRAVENOUS
  Administered 2020-03-12: 20 ug/min via INTRAVENOUS
  Administered 2020-03-13: 18:00:00 5 ug/min via INTRAVENOUS
  Administered 2020-03-15: 02:00:00 6 ug/min via INTRAVENOUS
  Filled 2020-03-04 (×15): qty 250

## 2020-03-04 MED ORDER — PROSOURCE PLUS PO LIQD
30.0000 mL | Freq: Two times a day (BID) | ORAL | Status: DC
  Administered 2020-03-04 – 2020-03-14 (×19): 30 mL via ORAL
  Filled 2020-03-04 (×25): qty 30

## 2020-03-04 MED ORDER — SALINE SPRAY 0.65 % NA SOLN
1.0000 | NASAL | Status: DC | PRN
  Administered 2020-03-04 – 2020-03-11 (×2): 1 via NASAL
  Filled 2020-03-04: qty 44

## 2020-03-04 MED ORDER — ENSURE ENLIVE PO LIQD
237.0000 mL | Freq: Two times a day (BID) | ORAL | Status: DC
  Administered 2020-03-04 – 2020-03-09 (×10): 237 mL via ORAL
  Filled 2020-03-04: qty 237

## 2020-03-04 MED ORDER — NOREPINEPHRINE 4 MG/250ML-% IV SOLN
0.0000 ug/min | INTRAVENOUS | Status: DC
  Administered 2020-03-04: 19 ug/min via INTRAVENOUS
  Administered 2020-03-04: 4 ug/min via INTRAVENOUS
  Administered 2020-03-04: 20 ug/min via INTRAVENOUS
  Filled 2020-03-04 (×3): qty 250

## 2020-03-04 MED ORDER — BISACODYL 5 MG PO TBEC
5.0000 mg | DELAYED_RELEASE_TABLET | Freq: Every day | ORAL | Status: DC
  Administered 2020-03-04 – 2020-03-06 (×3): 5 mg via ORAL
  Filled 2020-03-04 (×3): qty 1

## 2020-03-04 MED ORDER — POTASSIUM CHLORIDE 10 MEQ/50ML IV SOLN
10.0000 meq | INTRAVENOUS | Status: AC
  Administered 2020-03-04 (×5): 10 meq via INTRAVENOUS
  Filled 2020-03-04 (×4): qty 50

## 2020-03-04 MED ORDER — POTASSIUM CHLORIDE CRYS ER 20 MEQ PO TBCR
40.0000 meq | EXTENDED_RELEASE_TABLET | Freq: Once | ORAL | Status: AC
  Administered 2020-03-04: 40 meq via ORAL
  Filled 2020-03-04: qty 2

## 2020-03-04 MED ORDER — B COMPLEX-C PO TABS
1.0000 | ORAL_TABLET | Freq: Every day | ORAL | Status: DC
  Administered 2020-03-04 – 2020-03-15 (×12): 1 via ORAL
  Filled 2020-03-04 (×13): qty 1

## 2020-03-04 MED ORDER — POLYETHYLENE GLYCOL 3350 17 G PO PACK
17.0000 g | PACK | Freq: Every day | ORAL | Status: DC
  Administered 2020-03-05 – 2020-03-15 (×5): 17 g via ORAL
  Filled 2020-03-04 (×4): qty 1

## 2020-03-04 MED ORDER — BISACODYL 10 MG RE SUPP
10.0000 mg | Freq: Every day | RECTAL | Status: DC | PRN
  Administered 2020-03-06: 10 mg via RECTAL
  Filled 2020-03-04: qty 1

## 2020-03-04 MED ORDER — WARFARIN SODIUM 5 MG PO TABS
5.0000 mg | ORAL_TABLET | Freq: Once | ORAL | Status: AC
  Administered 2020-03-04: 5 mg via ORAL
  Filled 2020-03-04: qty 1

## 2020-03-04 NOTE — Progress Notes (Signed)
Initial Nutrition Assessment  DOCUMENTATION CODES:   Not applicable  INTERVENTION:   - ProSource Plus 30 ml po BID, each supplement provides 100 kcal and 15 grams of protein  - Ensure Enlive po BID, each supplement provides 350 kcal and 20 grams of protein  - B-complex with vitamin C to account for losses with CRRT  NUTRITION DIAGNOSIS:   Increased nutrient needs related to acute illness (anasarca requiring CRRT for volume removal) as evidenced by estimated needs.  GOAL:   Patient will meet greater than or equal to 90% of their needs  MONITOR:   PO intake, Supplement acceptance, Labs, Weight trends, Skin, I & O's  REASON FOR ASSESSMENT:   Rounds    ASSESSMENT:   84 year old male who presented to the ED on 11/30 with increased LE edema. PMH of CKD IV, CHF, DM, COPD, severe mitral regurgitation, mechanical aortic valve replacement, atrial fibrillation, CAD s/p CABG. Pt with anasarca likely from CHF with progressive renal disease.   12/01 - s/p non-tunneled dialysis catheter placement, transferred to ICU, CRRT initiated  Discussed pt with RN and during ICU rounds. Pt remains on CRRT and is requiring pressor support to pull fluid. Per Nephrology, pt is a marginal candidate for iHD given his advanced age, multiple comorbidities, and poor functional/nutritional status. Palliative Care has been consulted.  EDW: 102 kg (weight from 02/04/20) Current weight 122.8 kg  Spoke with pt at bedside. He reports that his appetite is alright. He states that he ate well at breakfast this morning and that breakfast is typically his best meal of the day. Pt states that at home he eats a full breakfast and a smaller lunch and dinner. Pt drinks half of a Premier Protein shake daily at home.  Pt endorses recent weight gain related to fluid status. He reports his UBW is in the 220-225 lb range.  Pt willing to consume oral nutrition supplements to aid in meeting increased kcal and protein needs. RD  to order.  Meal Completion: 90-95% x 2 documented meals  Medications reviewed and include: dulcolax, folic acid, SSI, miralax, vitamin B-12, warfarin, IV abx, levphed  Labs reviewed: sodium 130, potassium 3.2, BUN 70, creatinine 1.93, magnesium 2.5, hemoglobin 7.6 CBG's: 73-108 x 24 hours  UOP: 1730 ml x 24 hours CRRT UF: 1951 ml x 24 hours I/O's: -2.8 L since admit  NUTRITION - FOCUSED PHYSICAL EXAM:    Most Recent Value  Orbital Region Mild depletion  Upper Arm Region No depletion  Thoracic and Lumbar Region No depletion  Buccal Region No depletion  Temple Region Mild depletion  Clavicle Bone Region Mild depletion  Clavicle and Acromion Bone Region Mild depletion  Scapular Bone Region No depletion  Dorsal Hand No depletion  Patellar Region No depletion  Anterior Thigh Region No depletion  Posterior Calf Region No depletion  Edema (RD Assessment) Severe  [BLE]  Hair Reviewed  Eyes Reviewed  Mouth Reviewed  Skin Reviewed  Nails Reviewed       Diet Order:   Diet Order            Diet 2 gram sodium Room service appropriate? Yes; Fluid consistency: Thin  Diet effective now                 EDUCATION NEEDS:   Education needs have been addressed  Skin:  Skin Assessment: Skin Integrity Issues: Stage I: sacrum Other: venous stasis ulcer right leg, skin tear left shoulder  Last BM:  03/01/20  Height:  Ht Readings from Last 1 Encounters:  12/02/19 6\' 2"  (1.88 m)    Weight:   Wt Readings from Last 1 Encounters:  03/04/20 122.8 kg    BMI:  Body mass index is 34.76 kg/m.  Estimated Nutritional Needs:   Kcal:  7505-1833  Protein:  170-190 grams  Fluid:  2.0 L/day    Gustavus Bryant, MS, RD, LDN Inpatient Clinical Dietitian Please see AMiON for contact information.

## 2020-03-04 NOTE — Progress Notes (Signed)
Patient ID: Chord Takahashi, male   DOB: 21-Sep-1935, 84 y.o.   MRN: [id number] S: Feels "good", and tolerating CRRT but is on pressors due to hypotension.  Pt was seen and examined while on CRRT. O:BP 111/62   Pulse 65   Temp (!) 96.6 F (35.9 C) (Axillary)   Resp 17   Wt 122.8 kg   SpO2 98%   BMI 34.76 kg/m   Intake/Output Summary (Last 24 hours) at 03/04/2020 1041 Last data filed at 03/04/2020 1019 Gross per 24 hour  Intake 2417.02 ml  Output 4120 ml  Net -1702.98 ml   Intake/Output: I/O last 3 completed shifts: In: 1586.1 [P.O.:1000; I.V.:236.2; IV Piggyback:349.9] Out: 3614 [Urine:2480; ERXVQ:0086]  Intake/Output this shift:  Total I/O In: 761 [P.O.:810; I.V.:121] Out: 439 [Urine:75; Other:364] Weight change:  Gen: NAD CVS: IRR IRR Resp: CTA Abd: obese, +BS, soft Ext: 2+ anasarca to waist  Recent Labs  Lab 03/01/20 1051 03/02/20 1510 03/03/20 0409 03/03/20 0609 03/03/20 1533 03/03/20 2136 03/04/20 0500  NA 122* 124* 124* 126* 126* 129* 130*  K 3.8 3.9 3.1* 3.8 2.9* 4.5 3.2*  CL 86* 85* 85* 87* 86* 92* 91*  CO2 23 24 24  21* 25 25 25   GLUCOSE 115* 118* 112* 119* 117* 107* 92  BUN 111* 116* 114* 114* 111* 82* 70*  CREATININE 3.32* 3.29* 3.01* 2.92* 2.90* 2.23* 1.93*  ALBUMIN  --  3.2* 3.1*  --  3.1*  --  3.0*  CALCIUM 8.3* 8.6* 8.6* 8.7* 8.8* 8.3* 8.9  PHOS  --   --  5.5*  --  5.7*  --  3.6  AST  --  28 24  --   --   --   --   ALT  --  20 18  --   --   --   --    Liver Function Tests: Recent Labs  Lab 03/02/20 1510 03/02/20 1510 03/03/20 0409 03/03/20 1533 03/04/20 0500  AST 28  --  24  --   --   ALT 20  --  18  --   --   ALKPHOS 136*  --  118  --   --   BILITOT 1.1  --  1.3*  --   --   PROT 6.4*  --  6.0*  --   --   ALBUMIN 3.2*   < > 3.1* 3.1* 3.0*   < > = values in this interval not displayed.   No results for input(s): LIPASE, AMYLASE in the last 168 hours. No results for input(s): AMMONIA in the last 168 hours. CBC: Recent Labs  Lab  03/02/20 1510 03/03/20 0409 03/04/20 0459  WBC 4.4 3.2* 5.4  NEUTROABS 3.5  --   --   HGB 8.3* 8.5* 7.6*  HCT 26.2* 27.8* 24.8*  MCV 103.1* 103.0* 102.5*  PLT 78* 65* 109*   Cardiac Enzymes: No results for input(s): CKTOTAL, CKMB, CKMBINDEX, TROPONINI in the last 168 hours. CBG: Recent Labs  Lab 03/03/20 1732 03/03/20 2222 03/04/20 0742  GLUCAP 107* 108* 73    Iron Studies: No results for input(s): IRON, TIBC, TRANSFERRIN, FERRITIN in the last 72 hours. Studies/Results: IR Fluoro Guide CV Line Right  Result Date: 03/03/2020 INDICATION: 84 year old with chronic kidney disease and request for a non tunneled dialysis catheter. EXAM: FLUOROSCOPIC AND ULTRASOUND GUIDED PLACEMENT OF A NON-TUNNELED DIALYSIS CATHETER Physician: Stephan Minister. Henn, MD MEDICATIONS: None ANESTHESIA/SEDATION: None FLUOROSCOPY TIME:  Fluoroscopy Time: 24 seconds, 29 mGy COMPLICATIONS: None immediate. PROCEDURE: The procedure  was explained to the patient. The risks and benefits of the procedure were discussed and the patient's questions were addressed. Informed consent was obtained from the patient. The patient was placed supine on the interventional table. Ultrasound confirmed a patent right internal jugular vein. Ultrasound images were obtained for documentation. The right neck was prepped and draped in a sterile fashion. The right neck was anesthetized with 1% lidocaine. Maximal barrier sterile technique was utilized including caps, mask, sterile gowns, sterile gloves, sterile drape, hand hygiene and skin antiseptic. A small incision was made with #11 blade scalpel. A 21 gauge needle directed into the right internal jugular vein with ultrasound guidance. A micropuncture dilator set was placed. A 20 cm Mahurkar catheter was selected. The catheter was advanced over a wire and positioned at the superior cavoatrial junction. Fluoroscopic images were obtained for documentation. Both dialysis lumens were found to aspirate and  flush well. The proper amount of heparin was flushed in both lumens. The central venous lumen was flushed with normal saline. Catheter was sutured to skin. FINDINGS: Catheter tip at the superior cavoatrial junction. IMPRESSION: Successful placement of a right jugular non-tunneled dialysis catheter using ultrasound and fluoroscopic guidance. Electronically Signed   By: Markus Daft M.D.   On: 03/03/2020 13:58   IR US Guide Vasc Access Right  Result Date: 03/03/2020 INDICATION: 84 year old with chronic kidney disease and request for a non tunneled dialysis catheter. EXAM: FLUOROSCOPIC AND ULTRASOUND GUIDED PLACEMENT OF A NON-TUNNELED DIALYSIS CATHETER Physician: Stephan Minister. Henn, MD MEDICATIONS: None ANESTHESIA/SEDATION: None FLUOROSCOPY TIME:  Fluoroscopy Time: 24 seconds, 29 mGy COMPLICATIONS: None immediate. PROCEDURE: The procedure was explained to the patient. The risks and benefits of the procedure were discussed and the patient's questions were addressed. Informed consent was obtained from the patient. The patient was placed supine on the interventional table. Ultrasound confirmed a patent right internal jugular vein. Ultrasound images were obtained for documentation. The right neck was prepped and draped in a sterile fashion. The right neck was anesthetized with 1% lidocaine. Maximal barrier sterile technique was utilized including caps, mask, sterile gowns, sterile gloves, sterile drape, hand hygiene and skin antiseptic. A small incision was made with #11 blade scalpel. A 21 gauge needle directed into the right internal jugular vein with ultrasound guidance. A micropuncture dilator set was placed. A 20 cm Mahurkar catheter was selected. The catheter was advanced over a wire and positioned at the superior cavoatrial junction. Fluoroscopic images were obtained for documentation. Both dialysis lumens were found to aspirate and flush well. The proper amount of heparin was flushed in both lumens. The central venous  lumen was flushed with normal saline. Catheter was sutured to skin. FINDINGS: Catheter tip at the superior cavoatrial junction. IMPRESSION: Successful placement of a right jugular non-tunneled dialysis catheter using ultrasound and fluoroscopic guidance. Electronically Signed   By: Markus Daft M.D.   On: 03/03/2020 13:58   DG CHEST PORT 1 VIEW  Result Date: 03/03/2020 CLINICAL DATA:  Increased lower extremity edema EXAM: PORTABLE CHEST 1 VIEW COMPARISON:  03/02/2020 FINDINGS: Cardiac shadow is enlarged but stable. Postsurgical changes are again seen and stable. Aortic calcifications are noted. Mild vascular congestion is noted with mild interstitial edema. No focal infiltrate is seen. No bony abnormality is noted. IMPRESSION: Changes of mild CHF. Electronically Signed   By: Inez Catalina M.D.   On: 03/03/2020 02:40   DG Chest Port 1 View  Result Date: 03/02/2020 CLINICAL DATA:  Hyponatremia, lower extremity edema, decreased renal function, short of  breath EXAM: PORTABLE CHEST 1 VIEW COMPARISON:  01/09/2018 FINDINGS: Single frontal view of the chest demonstrates an enlarged cardiac silhouette. There is chronic central vascular congestion, with diffuse interstitial prominence likely reflecting an element of interstitial edema. There is also left basilar consolidation and small left pleural effusion, favor asymmetric edema over infection. No pneumothorax. No acute bony abnormalities. IMPRESSION: 1. Findings most consistent with congestive heart failure. Electronically Signed   By: Randa Ngo M.D.   On: 03/02/2020 17:20   . allopurinol  100 mg Oral Daily  . Chlorhexidine Gluconate Cloth  6 each Topical Q0600  . ezetimibe  10 mg Oral QHS   And  . simvastatin  20 mg Oral QHS  . finasteride  5 mg Oral QHS  . folic acid  6,144 mcg Oral Daily  . insulin aspart  0-15 Units Subcutaneous TID WC  . insulin aspart  0-5 Units Subcutaneous QHS  . levothyroxine  75 mcg Oral QAC breakfast  . midodrine  5 mg  Oral TID WC  . potassium chloride  40 mEq Oral Once  . tamsulosin  0.4 mg Oral QPC supper  . vitamin B-12  1,000 mcg Oral QHS  . warfarin  5 mg Oral ONCE-1600  . Warfarin - Pharmacist Dosing Inpatient   Does not apply q1600    BMET    Component Value Date/Time   NA 130 (L) 03/04/2020 0500   NA 138 07/01/2019 0900   NA 140 01/19/2017 0827   K 3.2 (L) 03/04/2020 0500   K 4.8 01/19/2017 0827   CL 91 (L) 03/04/2020 0500   CO2 25 03/04/2020 0500   CO2 24 01/19/2017 0827   GLUCOSE 92 03/04/2020 0500   GLUCOSE 103 01/19/2017 0827   BUN 70 (H) 03/04/2020 0500   BUN 87 (HH) 07/01/2019 0900   BUN 28.4 (H) 01/19/2017 0827   CREATININE 1.93 (H) 03/04/2020 0500   CREATININE 3.05 (HH) 11/12/2019 0820   CREATININE 1.2 01/19/2017 0827   CALCIUM 8.9 03/04/2020 0500   CALCIUM 9.7 01/19/2017 0827   GFRNONAA 34 (L) 03/04/2020 0500   GFRNONAA 18 (L) 11/12/2019 0820   GFRAA 20 (L) 12/02/2019 0927   GFRAA 21 (L) 11/12/2019 0820   CBC    Component Value Date/Time   WBC 5.4 03/04/2020 0459   RBC 2.42 (L) 03/04/2020 0459   HGB 7.6 (L) 03/04/2020 0459   HGB 9.3 (L) 08/01/2017 1136   HGB 11.6 (L) 03/30/2017 1147   HGB 12.0 (L) 01/19/2017 0827   HCT 24.8 (L) 03/04/2020 0459   HCT 31.1 (L) 08/20/2019 1105   HCT 37.8 (L) 01/19/2017 0827   PLT 109 (L) 03/04/2020 0459   PLT 56 (L) 08/01/2017 1136   PLT 59 (LL) 03/30/2017 1147   MCV 102.5 (H) 03/04/2020 0459   MCV 106 (H) 03/30/2017 1147   MCV 113.9 (H) 01/19/2017 0827   MCH 31.4 03/04/2020 0459   MCHC 30.6 03/04/2020 0459   RDW 18.3 (H) 03/04/2020 0459   RDW 14.5 03/30/2017 1147   RDW 16.2 (H) 01/19/2017 0827   LYMPHSABS 0.5 (L) 03/02/2020 1510   LYMPHSABS 0.8 (L) 01/19/2017 0827   MONOABS 0.4 03/02/2020 1510   MONOABS 0.3 01/19/2017 0827   EOSABS 0.0 03/02/2020 1510   EOSABS 0.1 01/19/2017 0827   BASOSABS 0.0 03/02/2020 1510   BASOSABS 0.0 01/19/2017 0827    Assessment/Plan:  1. CKDIV: BL Cr 2.5-3.4, Cr on admission 3.29.  Patient has a history of nephrosclerosis and cardiorenal syndrome and is followed  by Dr. Johnney Ou at clinic. Patient arrived significantly volume overloaded, about 45 lbs up from dry weight of 218. Failed aggressive diuresis at home.   1. Started on CRRT 03/03/20 and required initiation of levophed. 2. Using all 4K/2.5Ca bath:  Pre-filter 500 ml/hr, post-filter 300 ml/hr, dialysate 1500 ml/hr 3. Goal uf 100-200 ml/hr 4. Temporary HD catheter placement 03/03/20 by IR 5. Will likely need tunneled HD cath prior to discharge if he can tolerate eventual IHD 6. Patient is marginal candidate for IHD given his advanced age, multiple co-morbidities, and poor functional/nutritional status. Does follow with palliative care at home.  2. Hypokalemia: improved with 4K bath and CRRT 3. NYHA class IV CHF with volume overload and hypotension-  No on levophed. 4. UTI- on cefepime per PCCM 5. Renal osteodystrophy: Phos 5.5, PTH pending 6. Anemia - Patient has history of ITP, Hgb today 8.5. Feraheme today. 7. CHF - Advanced heart failure following.  8. Mechanical AVR on coumadin goal INR 2.5-3.5 9. Afib 10. CASHD s/p CABG and left CEA 11. COPD 92. DM 13. ITP  Donetta Potts, MD [employer] [phone]

## 2020-03-04 NOTE — Plan of Care (Signed)
  Problem: Nutrition Goal: Patient maintains adequate hydration Outcome: Progressing   Problem: Fluid Volume: Goal: Fluid volume balance will be maintained or improved Outcome: Progressing Note: Able to take off fluid with CRRT overnight, but pressor support remains high to do so.   Problem: Respiratory: Goal: Respiratory symptoms related to disease process will be avoided Outcome: Progressing   Problem: Activity: Goal: Activity intolerance will improve Outcome: Not Progressing

## 2020-03-04 NOTE — Progress Notes (Signed)
Unable to replace K of 3.2 d/t renal insufficiency

## 2020-03-04 NOTE — Progress Notes (Addendum)
Advanced Heart Failure Rounding Note  PCP-Cardiologist: Belva Crome III, MD   Subjective:    1.7L in UOP yesterday w/ additional 2L fluid removal through CRRT.  Requiring pressor support w/ phenylephrine 250 mcg/min. Also on midodrine 5 tid.   SCr improving, 3.3 on admit down to 1.93 today.  BUN from 116>>70.   K 3.2 Na improving up to 130 today.   Has UTI. UC + for Klebsiella Pneumoniae. On Cefepime q12. AF. WBC WNL.    Sitting up in bed. Says he feels better. No resting dyspnea.    Objective:   Weight Range: 122.8 kg Body mass index is 34.76 kg/m.   Vital Signs:   Temp:  [93.7 F (34.3 C)-97.6 F (36.4 C)] 97.1 F (36.2 C) (12/02 1137) Pulse Rate:  [46-69] 63 (12/02 1300) Resp:  [8-22] 16 (12/02 1300) BP: (78-159)/(36-107) 123/105 (12/02 1300) SpO2:  [71 %-100 %] 97 % (12/02 1300) Weight:  [122.8 kg-132.7 kg] 122.8 kg (12/02 0500) Last BM Date: 03/01/20  Weight change: Filed Weights   03/03/20 1500 03/04/20 0500  Weight: 132.7 kg 122.8 kg    Intake/Output:   Intake/Output Summary (Last 24 hours) at 03/04/2020 1315 Last data filed at 03/04/2020 1300 Gross per 24 hour  Intake 2807.13 ml  Output 3792 ml  Net -984.87 ml      Physical Exam    General:  Chronically ill/ fatigue appearing WM. No resp difficulty HEENT: Normal Neck: Supple. JVP elevated to ear . Rt IJ HD cath Carotids 2+ bilat; no bruits. No lymphadenopathy or thyromegaly appreciated. Cor: PMI nondisplaced. Regular rate & rhythm. No rubs, gallops or murmurs. Lungs: decreased BS at bases  Abdomen: obese, soft, nontender, nondistended. No hepatosplenomegaly. No bruits or masses. Good bowel sounds. Extremities: No cyanosis, clubbing, rash, 3+ bilateral LE to thighs  Neuro: Alert & orientedx3, cranial nerves grossly intact. moves all 4 extremities w/o difficulty. Affect pleasant   Telemetry   ?AFL w/ SVR 50s  EKG    No new EKG to review   Labs    CBC Recent Labs     03/02/20 1510 03/02/20 1510 03/03/20 0409 03/04/20 0459  WBC 4.4   < > 3.2* 5.4  NEUTROABS 3.5  --   --   --   HGB 8.3*   < > 8.5* 7.6*  HCT 26.2*   < > 27.8* 24.8*  MCV 103.1*   < > 103.0* 102.5*  PLT 78*   < > 65* 109*   < > = values in this interval not displayed.   Basic Metabolic Panel Recent Labs    03/03/20 1533 03/03/20 1533 03/03/20 2136 03/04/20 0459 03/04/20 0500  NA 126*   < > 129*  --  130*  K 2.9*   < > 4.5  --  3.2*  CL 86*   < > 92*  --  91*  CO2 25   < > 25  --  25  GLUCOSE 117*   < > 107*  --  92  BUN 111*   < > 82*  --  70*  CREATININE 2.90*   < > 2.23*  --  1.93*  CALCIUM 8.8*   < > 8.3*  --  8.9  MG  --   --   --  2.5*  --   PHOS 5.7*  --   --   --  3.6   < > = values in this interval not displayed.   Liver Function Tests Recent Labs  03/02/20 1510 03/02/20 1510 03/03/20 0409 03/03/20 0409 03/03/20 1533 03/04/20 0500  AST 28  --  24  --   --   --   ALT 20  --  18  --   --   --   ALKPHOS 136*  --  118  --   --   --   BILITOT 1.1  --  1.3*  --   --   --   PROT 6.4*  --  6.0*  --   --   --   ALBUMIN 3.2*   < > 3.1*   < > 3.1* 3.0*   < > = values in this interval not displayed.   No results for input(s): LIPASE, AMYLASE in the last 72 hours. Cardiac Enzymes No results for input(s): CKTOTAL, CKMB, CKMBINDEX, TROPONINI in the last 72 hours.  BNP: BNP (last 3 results) Recent Labs    05/13/19 1005 12/02/19 0927  BNP 187.2* 169.9*    ProBNP (last 3 results) No results for input(s): PROBNP in the last 8760 hours.   D-Dimer No results for input(s): DDIMER in the last 72 hours. Hemoglobin A1C No results for input(s): HGBA1C in the last 72 hours. Fasting Lipid Panel No results for input(s): CHOL, HDL, LDLCALC, TRIG, CHOLHDL, LDLDIRECT in the last 72 hours. Thyroid Function Tests Recent Labs    03/03/20 0512  TSH 28.380*    Other results:   Imaging     No results found.   Medications:     Scheduled Medications: .  (feeding supplement) PROSource Plus  30 mL Oral BID BM  . allopurinol  100 mg Oral Daily  . B-complex with vitamin C  1 tablet Oral Daily  . bisacodyl  5 mg Oral Daily  . Chlorhexidine Gluconate Cloth  6 each Topical Q0600  . ezetimibe  10 mg Oral QHS   And  . simvastatin  20 mg Oral QHS  . feeding supplement  237 mL Oral BID BM  . finasteride  5 mg Oral QHS  . folic acid  2,876 mcg Oral Daily  . insulin aspart  0-15 Units Subcutaneous TID WC  . insulin aspart  0-5 Units Subcutaneous QHS  . levothyroxine  75 mcg Oral QAC breakfast  . midodrine  5 mg Oral TID WC  . [START ON 03/05/2020] polyethylene glycol  17 g Oral Daily  . tamsulosin  0.4 mg Oral QPC supper  . vitamin B-12  1,000 mcg Oral QHS  . warfarin  5 mg Oral ONCE-1600  . Warfarin - Pharmacist Dosing Inpatient   Does not apply q1600     Infusions: .  prismasol BGK 4/2.5 500 mL/hr at 03/04/20 0306  .  prismasol BGK 4/2.5 300 mL/hr at 03/04/20 1038  . sodium chloride Stopped (03/04/20 1131)  . ceFEPime (MAXIPIME) IV 2 g (03/04/20 1019)  . furosemide    . phenylephrine (NEO-SYNEPHRINE) Adult infusion 250 mcg/min (03/04/20 1300)  . potassium chloride 50 mL/hr at 03/04/20 1300  . prismasol BGK 4/2.5 1,500 mL/hr at 03/04/20 1023     PRN Medications:  acetaminophen **OR** acetaminophen, albuterol, alteplase, bisacodyl, heparin, lidocaine (PF), lidocaine-prilocaine, pentafluoroprop-tetrafluoroeth, sodium chloride, sodium chloride   Assessment/Plan   1. Acute on Chronic Diastolic Heart Failure w/ Prominent RV Failure - Echo in 3/21 with EF 50-55%, mildly decreased RV systolic function, severe rheumatic mitral regurgitation, normal mechanical aortic valve. Severe MR likely plays a significant role in CHF and RV failure. NYHA class Illb-IV. Now admitted w/ massive anasarca and failing  high dose outpatient diuretics in the setting of progressive CKD - started CRRT 12/1 for fluid removal, requiring pressor support w/  phenylephrine 250 mcg/min. Also on midodrine 5 tid. - With RV failure, concern regarding his ability to tolerate iHD.  - Will ask palliative care team to visit to discuss [place]     2. Stage IV CKD - followed by [facility]. Baseline SCr 2.5-3.4 - Scr 3.3 on admit, improving w/ CRRT 1.98 today  - continue CRRT per nephrology   3. Chronic Afib w/ SVR - currently in slow Afib 50s, noted to have short AIVR runs - in the setting of uremia, infection (UTI) and hypothyroidism (TSH 28) - electrolytes ok, HS trop 44>>39 (likely demand ischemia)  - continue treatment of acute illness, plan HD + abx - monitor on tele  - continue to hold  blocker  4. UTI  - UC + for Klebsiella Pneumoniae. On Cefepime q12   Has chronic foley. AF w/ nl WBC ct - continue abx per IM  5. Chronic Anemia - IDA, likely 2/2 anemia of chronic disease from CKD  - Hgb down to 7.6 today, monitor and transfuse for hgb < 7.0  - recent Tsats 10%, got dose of feraheme 11/30     6. Mechanical Aortic Valve - on coumadin. IRN goal 2.5-3.5 - INR 3.1 today  - coumadin dosing per pharmacy   7. CAD - s/p CABG - stable w/o ischemic symptoms   8. Hypothyroidism  - TSH markedly elevated at 28  - on levothyroxine at home - T3 low at 43. T4 nl - further management/ dose adjustment per IM (currently on 75 mcg levothyroxine, home dose)   9. Severe MR:  - TEE 7/19 with severe MR with possible rheumatic MV (do not think MS is significant, mildly elevated mean gradient from high flow with severe MR). Structural Heart team evaluated, not a Mitraclip candidate. Seen by Dr. [name], would be high risk for open surgery. He was evaluated at [facility] in [place] for percutaneous mitral valve trials. Unfortunately, he does not qualify for any of the available trials. We will have to continue medical management.   10. Hypervolemic Hyponatremia - Na was down to 122, trending up 130 today - continue to fluid restrict - continue HD for volume  removal   Length of Stay: 2  Brittainy Simmons, PA-C  03/04/2020, 1:15 PM  Advanced Heart Failure Team Pager [phone] (M-F; 7a - 4p)  Please contact [facility] for night-coverage after hours (4p -7a ) and weekends on [email or web address]  Patient seen with PA, agree with the above note.   Tolerating CVVH but requiring phenylephrine @ 250.  Pulling 100-200 cc/hr net.   No complaints, breathing is better.   On cefepime for Klebsiella UTI.   General: NAD Neck: JVP 16+ cm, no thyromegaly or thyroid nodule.  Lungs: Clear to auscultation bilaterally with normal respiratory effort. CV: Nondisplaced PMI.  Heart irregular S1/S2 with mechanical S2, no S3/S4, no murmur.  2+ edema to knees.  Abdomen: Soft, nontender, no hepatosplenomegaly, no distention.  Skin: Intact without lesions or rashes.  Neurologic: Alert and oriented x 3.  Psych: Normal affect. Extremities: No clubbing or cyanosis.  HEENT: Normal.   Think he will do better with norepinephrine versus phenylephrine (will help support RV), will make transition.   Continue CVVH per nephrology, still has lots of fluid to remove.  Will need to eventually come off pressor and be able to tolerate iHD.   - Increase midodrine to  10 mg tid.   He remains in chronic atrial fibrillation with therapeutic INR.   Klebsiella UTI, cefepime.  Loralie Champagne 03/04/2020 2:30 PM

## 2020-03-04 NOTE — Progress Notes (Signed)
Orthopedic Tech Progress Note Patient Details:  [name] October 30, 1935 [id number]  Ortho Devices Type of Ortho Device: Haematologist Ortho Device/Splint Location: Bi LE Ortho Device/Splint Interventions: Application, Adjustment   Post Interventions Patient Tolerated: Well Instructions Provided: Care of device   [name] E Voncile Schwarz 03/04/2020, 7:52 PM

## 2020-03-04 NOTE — Progress Notes (Signed)
ANTICOAGULATION CONSULT NOTE   Pharmacy Consult for Warfarin Indication: Mechanical AVR  Allergies  Allergen Reactions  . Rifampin Rash and Other (See Comments)    May have been caused by Vancomycin or Rifampin (??)  . Vancomycin Rash and Other (See Comments)    May have been caused by Vancomycin or Rifampin (??)    Patient Measurements: Weight: 122.8 kg (270 lb 11.6 oz) Heparin Dosing Weight:   Vital Signs: Temp: 94.3 F (34.6 C) (12/02 0336) Temp Source: Axillary (12/02 0336) BP: 101/46 (12/02 0700) Pulse Rate: 60 (12/02 0700)  Labs: Recent Labs    03/02/20 1510 03/02/20 1510 03/03/20 0409 03/03/20 0409 03/03/20 0609 03/03/20 0609 03/03/20 1533 03/03/20 2136 03/04/20 0459 03/04/20 0500  HGB 8.3*   < > 8.5*  --   --   --   --   --  7.6*  --   HCT 26.2*  --  27.8*  --   --   --   --   --  24.8*  --   PLT 78*  --  65*  --   --   --   --   --  109*  --   LABPROT 28.0*  --  28.3*  --   --   --   --   --  31.1*  --   INR 2.7*  --  2.8*  --   --   --   --   --  3.1*  --   CREATININE 3.29*   < > 3.01*   < > 2.92*   < > 2.90* 2.23*  --  1.93*  TROPONINIHS  --   --  44*  --  39*  --   --   --   --   --    < > = values in this interval not displayed.    Estimated Creatinine Clearance: 41.1 mL/min (A) (by C-G formula based on SCr of 1.93 mg/dL (H)).   Medical History: Past Medical History:  Diagnosis Date  . Carotid artery disease ([place]) 1994   s/p left carotid endarerectomy   . Chronic atrial fibrillation (HCC)    a. on coumadin   . Chronic diastolic CHF (congestive heart failure) ([place])   . Diabetes mellitus without complication ([place])    dx 2016  . Dyspnea   . Epistaxis   . Heart murmur   . Hx of CABG    a. 1994  . Hypertension   . OSA (obstructive sleep apnea)   . Rheumatic fever   . S/P AVR (aortic valve replacement)    a. mechanical valve 1996  . Subclavian bypass stenosis ([place])   . Temporary low platelet count (HCC)    chronic problem since receiving  aortic valve replacement  . Urinary catheter in place 12/2019  . Vitamin B12 deficiency     Medications:  Scheduled:  . allopurinol  100 mg Oral Daily  . Chlorhexidine Gluconate Cloth  6 each Topical Q0600  . ezetimibe  10 mg Oral QHS   And  . simvastatin  20 mg Oral QHS  . finasteride  5 mg Oral QHS  . folic acid  1,761 mcg Oral Daily  . insulin aspart  0-15 Units Subcutaneous TID WC  . insulin aspart  0-5 Units Subcutaneous QHS  . levothyroxine  75 mcg Oral QAC breakfast  . midodrine  5 mg Oral TID WC  . tamsulosin  0.4 mg Oral QPC supper  . vitamin B-12  1,000 mcg Oral  QHS  . Warfarin - Pharmacist Dosing Inpatient   Does not apply q1600    Assessment: 50 yom that is being admitted for SOB after fluid overload in the setting of CKD. Patient is on warfarin at home for a mechanical AVR. INR today therapeutic at 1.9, CBC is decreasing.  PTA Regimen: Warfarin 5mg  Sat and Tues and 2.5mg  ROW  Goal of Therapy:  INR 2.5-3.5 Monitor platelets by anticoagulation protocol: Yes   Plan:  -Warfarin 5 mg x 1 tonight -Daily INR, CBC   Alanda Slim, PharmD, [place] Clinical Pharmacist Please see AMION for all Pharmacists' Contact Phone Numbers 03/04/2020, 7:29 AM

## 2020-03-04 NOTE — Consult Note (Signed)
NAME:  [name], MRN:  [id number], DOB:  [birth year], LOS: 2 ADMISSION DATE:  03/02/2020, CONSULTATION DATE:  03/03/2020 REFERRING MD:  Dr. Kristopher Oppenheim, CHIEF COMPLAINT:  Cardiorenal syndrome   History of present illness   84 year old male presents to ED on 11/30 with progressive shortness of breath and lower extremity edema with CHF and Severe MR with progressive renal failure. Has been followed by Heart Failure and Evaluated by CTS. Determined not a candidate for MitraClip. High risk for conventional surgery. Recommended aggressive medical management.   On arrival to ED patient is massively volume overloaded with poor response to outpatient diuretics. Crt 3.3. BUN 116. INR 2.8. Nephrology and Heart Failure Consulted. Attempted High dose diuretics overnight. Complicated also by hypotension. 12/1 decision made to transfer to ICU for CRRT.   Goals of care conversation with patient. States he is a DNR/DNI however wants to try dialysis, if it does not work would be amendable to comfort.   Past Medical History  Chronic Combined CHF, Severe Mitral Regurgitation, H/O mechanical aortic valve replacement, A.Fib, CAD s/p CABG, CKD stage IV (baseline Crt 2.5-3), ITP anemia  Significant Hospital Events   11/30 > Admit to Hospital  12/1 > Transfer to ICU for CRRT   Consults:  Heart Failure Nephrology  PCCM  Procedures:  Right IJ Vascath 12/1 >>  Significant Diagnostic Tests:  CXR 12/1 > Cardiac shadow is enlarged but stable. Postsurgical changes are again seen and stable. Aortic calcifications are noted. Mild vascular congestion is noted with mild interstitial edema. No focal infiltrate is seen. No bony abnormality is noted.  Micro Data:  Urine Culture 12/1 >>   Antimicrobials:  Cefepime x 1 dose 12/1   Interim history/subjective:  No acute events overnight. Patient tolerating CRRT well. He was started on neosynephrine which has been changed to levophed to maintain adequate blood  pressure.   Urine culture is growing klebsiella.  Objective   Blood pressure (!) 105/57, pulse 74, temperature (!) 97.2 F (36.2 C), temperature source Oral, resp. rate 16, weight 122.8 kg, SpO2 98 %.        Intake/Output Summary (Last 24 hours) at 03/04/2020 1712 Last data filed at 03/04/2020 1656 Gross per 24 hour  Intake 3588.84 ml  Output 4777 ml  Net -1188.16 ml   Filed Weights   03/03/20 1500 03/04/20 0500  Weight: 132.7 kg 122.8 kg    Examination: General:chronically ill appearing elderly male, no acute distress HEENT: Right IJ HD cathter, sclera anicteric, moist mucous membranes Neuro:alert, oriented x 3, PERRL, no focal deficit CV: mechanical click, irregularly irregular PULM:clear to auscultation. No wheezing GI: soft, non-tender, non-distended Extremities: 2+ edema, venous stasis changes Skin: no lesions  Resolved Hospital Problem list     Assessment & Plan:   Acute on Chronic Heart Failure with severe volume overload and RV Failure with hypotension  Severe MR H/O AVR CAD s/p CABG -Not a candidate for Mitral Clip, not a surgical candidate per CTS. Evaluated at [facility] for percutaneous mitral valve trial however denied  Plan -Heart Failure following  -Continue Midodrine - Levophed for vasopressor support -Volume removal with CRRT  Stage IV CKD (baseline Crt 2.5-3.4)  Cardiorenal Syndrome  Plan  -Nephrology Following  - CRRT  -Trend BMP  UTI  Plan -Culture showing klebsiella pneumoniae -Continue Cefepime until sensitivities return  A.Fib on Coumadin Plan -Continue Coumadin, Pharmacy to Manage  -Holding amiodarone and coreg in setting of current bradycardia    COPD OSA on CPAP at HS  Plan -Titrate Supplemental Oxygen to maintain saturation >90 -PRN Nebs -CPAP at HS   DM  Plan -Trend Glucose -SSI  ITP Plan -Trend CBC   Best practice (evaluated daily)   Diet: Low Salt Diet  DVT prophylaxis: Coumadin as above GI prophylaxis:  N/A Glucose control: SSI Mobility: Bedrest  last date of multidisciplinary goals of care discussion: palliative care has been consulted, pending discussion Family and staff present  Summary of discussion  Follow up goals of care discussion due Code Status: DNR Disposition: ICU  Labs   CBC: Recent Labs  Lab 03/02/20 1510 03/03/20 0409 03/04/20 0459  WBC 4.4 3.2* 5.4  NEUTROABS 3.5  --   --   HGB 8.3* 8.5* 7.6*  HCT 26.2* 27.8* 24.8*  MCV 103.1* 103.0* 102.5*  PLT 78* 65* 109*    Basic Metabolic Panel: Recent Labs  Lab 03/03/20 0409 03/03/20 0409 03/03/20 0609 03/03/20 1533 03/03/20 2136 03/04/20 0459 03/04/20 0500 03/04/20 1600  NA 124*   < > 126* 126* 129*  --  130* 132*  K 3.1*   < > 3.8 2.9* 4.5  --  3.2* 4.1  CL 85*   < > 87* 86* 92*  --  91* 94*  CO2 24   < > 21* 25 25  --  25 25  GLUCOSE 112*   < > 119* 117* 107*  --  92 133*  BUN 114*   < > 114* 111* 82*  --  70* 47*  CREATININE 3.01*   < > 2.92* 2.90* 2.23*  --  1.93* 1.63*  CALCIUM 8.6*   < > 8.7* 8.8* 8.3*  --  8.9 8.8*  MG  --   --   --   --   --  2.5*  --   --   PHOS 5.5*  --   --  5.7*  --   --  3.6 2.9   < > = values in this interval not displayed.   GFR: Estimated Creatinine Clearance: 48.6 mL/min (A) (by C-G formula based on SCr of 1.63 mg/dL (H)). Recent Labs  Lab 03/02/20 1510 03/03/20 0409 03/04/20 0459  WBC 4.4 3.2* 5.4    Liver Function Tests: Recent Labs  Lab 03/02/20 1510 03/03/20 0409 03/03/20 1533 03/04/20 0500 03/04/20 1600  AST 28 24  --   --   --   ALT 20 18  --   --   --   ALKPHOS 136* 118  --   --   --   BILITOT 1.1 1.3*  --   --   --   PROT 6.4* 6.0*  --   --   --   ALBUMIN 3.2* 3.1* 3.1* 3.0* 3.3*   No results for input(s): LIPASE, AMYLASE in the last 168 hours. No results for input(s): AMMONIA in the last 168 hours.  ABG    Component Value Date/Time   PHART 7.321 (L) 12/30/2008 1303   PCO2ART 48.5 (H) 12/30/2008 1303   PO2ART 296.0 (H) 12/30/2008 1303    HCO3 27.0 04/30/2018 1212   TCO2 28 04/30/2018 1212   ACIDBASEDEF 1.0 12/30/2008 1303   O2SAT 76.0 04/30/2018 1212     Coagulation Profile: Recent Labs  Lab 03/01/20 1051 03/02/20 1510 03/03/20 0409 03/04/20 0459  INR 3.2* 2.7* 2.8* 3.1*    Cardiac Enzymes: No results for input(s): CKTOTAL, CKMB, CKMBINDEX, TROPONINI in the last 168 hours.  HbA1C: Hgb A1c MFr Bld  Date/Time Value Ref Range Status  03/25/2019 09:53 AM 5.6  4.6 - 6.5 % Final    Comment:    Glycemic Control Guidelines for People with Diabetes:Non Diabetic:  <6%Goal of Therapy: <7%Additional Action Suggested:  >8%   07/01/2018 09:28 AM 5.7 4.6 - 6.5 % Final    Comment:    Glycemic Control Guidelines for People with Diabetes:Non Diabetic:  <6%Goal of Therapy: <7%Additional Action Suggested:  >8%     CBG: Recent Labs  Lab 03/03/20 1732 03/03/20 2222 03/04/20 0742 03/04/20 1135  GLUCAP 107* 108* 73 107*       Critical care time: 45 minutes     Freda Jackson, MD [facility] Office: [phone]   See Amion for Pager Details

## 2020-03-05 DIAGNOSIS — N189 Chronic kidney disease, unspecified: Secondary | ICD-10-CM | POA: Diagnosis not present

## 2020-03-05 DIAGNOSIS — R570 Cardiogenic shock: Secondary | ICD-10-CM | POA: Diagnosis not present

## 2020-03-05 DIAGNOSIS — R601 Generalized edema: Secondary | ICD-10-CM | POA: Diagnosis not present

## 2020-03-05 DIAGNOSIS — N179 Acute kidney failure, unspecified: Secondary | ICD-10-CM | POA: Diagnosis not present

## 2020-03-05 LAB — RENAL FUNCTION PANEL
Albumin: 3 g/dL — ABNORMAL LOW (ref 3.5–5.0)
Albumin: 3.1 g/dL — ABNORMAL LOW (ref 3.5–5.0)
Anion gap: 9 (ref 5–15)
Anion gap: 9 (ref 5–15)
BUN: 34 mg/dL — ABNORMAL HIGH (ref 8–23)
BUN: 26 mg/dL — ABNORMAL HIGH (ref 8–23)
CO2: 26 mmol/L (ref 22–32)
CO2: 25 mmol/L (ref 22–32)
Calcium: 8.6 mg/dL — ABNORMAL LOW (ref 8.9–10.3)
Calcium: 8.3 mg/dL — ABNORMAL LOW (ref 8.9–10.3)
Chloride: 97 mmol/L — ABNORMAL LOW (ref 98–111)
Chloride: 97 mmol/L — ABNORMAL LOW (ref 98–111)
Creatinine, Ser: 1.35 mg/dL — ABNORMAL HIGH (ref 0.61–1.24)
Creatinine, Ser: 1.12 mg/dL (ref 0.61–1.24)
GFR, Estimated: 52 mL/min — ABNORMAL LOW (ref >60)
GFR, Estimated: >60 mL/min (ref >60)
Glucose, Bld: 123 mg/dL — ABNORMAL HIGH (ref 70–99)
Glucose, Bld: 110 mg/dL — ABNORMAL HIGH (ref 70–99)
Phosphorus: 2.4 mg/dL — ABNORMAL LOW (ref 2.5–4.6)
Phosphorus: 2 mg/dL — ABNORMAL LOW (ref 2.5–4.6)
Potassium: 4.8 mmol/L (ref 3.5–5.1)
Potassium: 4.5 mmol/L (ref 3.5–5.1)
Sodium: 132 mmol/L — ABNORMAL LOW (ref 135–145)
Sodium: 131 mmol/L — ABNORMAL LOW (ref 135–145)

## 2020-03-05 LAB — CBC
HCT: 26.1 % — ABNORMAL LOW (ref 39.0–52.0)
Hemoglobin: 7.7 g/dL — ABNORMAL LOW (ref 13.0–17.0)
MCH: 31.3 pg (ref 26.0–34.0)
MCHC: 29.5 g/dL — ABNORMAL LOW (ref 30.0–36.0)
MCV: 106.1 fL — ABNORMAL HIGH (ref 80.0–100.0)
Platelets: 101 10*3/uL — ABNORMAL LOW (ref 150–400)
RBC: 2.46 MIL/uL — ABNORMAL LOW (ref 4.22–5.81)
RDW: 18.5 % — ABNORMAL HIGH (ref 11.5–15.5)
WBC: 6.4 10*3/uL (ref 4.0–10.5)
nRBC: 0.6 % — ABNORMAL HIGH (ref 0.0–0.2)

## 2020-03-05 LAB — GLUCOSE, CAPILLARY
Glucose-Capillary: 148 mg/dL — ABNORMAL HIGH (ref 70–99)
Glucose-Capillary: 179 mg/dL — ABNORMAL HIGH (ref 70–99)
Glucose-Capillary: 144 mg/dL — ABNORMAL HIGH (ref 70–99)
Glucose-Capillary: 147 mg/dL — ABNORMAL HIGH (ref 70–99)

## 2020-03-05 LAB — MAGNESIUM: Magnesium: 2.5 mg/dL — ABNORMAL HIGH (ref 1.7–2.4)

## 2020-03-05 LAB — URINE CULTURE: Culture: >=100000 — AB

## 2020-03-05 LAB — PROTIME-INR
INR: 3.8 — ABNORMAL HIGH (ref 0.8–1.2)
Prothrombin Time: 36.1 seconds — ABNORMAL HIGH (ref 11.4–15.2)

## 2020-03-05 MED ORDER — WARFARIN SODIUM 2.5 MG PO TABS
2.5000 mg | ORAL_TABLET | Freq: Once | ORAL | Status: AC
  Administered 2020-03-05: 2.5 mg via ORAL
  Filled 2020-03-05: qty 1

## 2020-03-05 MED ORDER — MIDODRINE HCL 5 MG PO TABS
10.0000 mg | ORAL_TABLET | Freq: Three times a day (TID) | ORAL | Status: DC
  Administered 2020-03-05 – 2020-03-08 (×8): 10 mg via ORAL
  Filled 2020-03-05 (×8): qty 2

## 2020-03-05 MED ORDER — CEPHALEXIN 250 MG PO CAPS
250.0000 mg | ORAL_CAPSULE | Freq: Three times a day (TID) | ORAL | Status: AC
  Administered 2020-03-05 – 2020-03-09 (×14): 250 mg via ORAL
  Filled 2020-03-05 (×14): qty 1

## 2020-03-05 MED ORDER — POTASSIUM PHOSPHATES 15 MMOLE/5ML IV SOLN
20.0000 mmol | Freq: Once | INTRAVENOUS | Status: AC
  Administered 2020-03-05: 20 mmol via INTRAVENOUS
  Filled 2020-03-05: qty 6.67

## 2020-03-05 NOTE — Consult Note (Signed)
Palliative Care Consult Note  [name] is an 84 yo man with advanced heart failure and RV failure with progressive stage IV chronic kidney disease failing outpatient diuretics admitted with acute failure and volume overload, demand ischemia and UTI now in ICU on CRRT. He has not been a candidate for advanced HF interventions and will not be able to tolerate iHD.   [name] is aware of the terminal nature of his condition. He values QOL and comfort if there are no additional treatments that will improve his current condition. He reports that he was seen once by palliative care at home but they never followed up to help him get his documents for living will and DNR in place. After speaking with other members of the medical team I shared with him that the consensus was he would likely not be good candidate for iHD -we talked about the challenges ahead even if he could have iHD done and what that may mean for him in terms of his QOL, functional status and overall well being.  I introduced briefly the concept of hospice care at home and he is open to this service and it aligns with his goals given the information he has received from his nephrology and cardiology. He has a strong family support system and would like to include them in panning and preparing for his care.  Recommendations:  1. DNR  2. He is tolerating CRRT but requiring levophed for BP-if he can be taken off the levophed and achieves some volume removal he will be more stable and comfortable for the transition home with hospice care- plan for now is to continue CRRT, wean levophed for the next 24-48 hours and then transition him home with hospice care.  3. Will plan a meeting this weekend with his wife and two of his children at his request to discuss his condition and the recommendation for hospice care.  4. He is not in any distress and has excellent awareness and cognition. No current symptom management needs.  Lane Hacker,  DO Palliative Medicine  Time: 30 minutes Greater than 50%  of this time was spent counseling and coordinating care related to the above assessment and plan.'

## 2020-03-05 NOTE — TOC Progression Note (Signed)
Transition of Care [facility]) - Progression Note    Patient Details  Name: [name] MRN: [id number] Date of Birth: 17-Feb-1936  Transition of Care [facility]) CM/SW Contact  Ella Bodo, RN Phone Number: 03/05/2020, 12:29 PM  Clinical Narrative:  Notified by CSW that family meeting with palliative care later today, and may plan to transition to Home with Hospice Care.  Pt currently followed in the community for palliative care services by [place].  Notified Heide Guile with [facility] [phone]) of possible transition to Hospice care in the near future. [facility] Liaison to follow up on patient today and through the weekend should transition occur.      Expected Discharge Plan: Home w Hospice Care Barriers to Discharge: Continued Medical Work up  Expected Discharge Plan and Services Expected Discharge Plan: [place] In-house Referral: Clinical Social Work Discharge Planning Services: CM Consult Post Acute Care Choice: Hospice Living arrangements for the past 2 months: Single Family Home                                       Social Determinants of Health (SDOH) Interventions    Readmission Risk Interventions No flowsheet data found.  Reinaldo Raddle, RN, BSN  Trauma/Neuro ICU Case Manager [phone]

## 2020-03-05 NOTE — Progress Notes (Signed)
CSW received consult for this patient to receive hospice services at home - patient is active with [facility] for palliative services already. Almyra Free, RN CM will complete referral for hospice services.  Madilyn Fireman, MSW, LCSW-A Transitions of Care  Clinical Social Worker I [facility] Emergency Departments  Medical ICU [phone]

## 2020-03-05 NOTE — Progress Notes (Addendum)
Advanced Heart Failure Rounding Note  PCP-Cardiologist: [name], MD   Subjective:    Remains on CVVHD. Off Neo on norepi 21 mcg.   Feels ok. No pain. Family at bedside.   Objective:   Weight Range: 120.4 kg Body mass index is 34.08 kg/m.   Vital Signs:   Temp:  [97.1 F (36.2 C)-97.6 F (36.4 C)] 97.3 F (36.3 C) (12/03 0742) Pulse Rate:  [55-74] 59 (12/03 0930) Resp:  [11-24] 14 (12/03 0930) BP: (92-123)/(40-105) 104/48 (12/03 0930) SpO2:  [71 %-100 %] 98 % (12/03 0930) Weight:  [120.4 kg] 120.4 kg (12/03 0500) Last BM Date: 03/31/20  Weight change: Filed Weights   03/03/20 1500 03/04/20 0500 03/05/20 0500  Weight: 132.7 kg 122.8 kg 120.4 kg    Intake/Output:   Intake/Output Summary (Last 24 hours) at 03/05/2020 1103 Last data filed at 03/05/2020 0905 Gross per 24 hour  Intake 3142.87 ml  Output 6029 ml  Net -2886.13 ml      Physical Exam    General: . No resp difficulty HEENT: normal Neck: supple. JVP to jaw. Carotids 2+ bilat; no bruits. No lymphadenopathy or thryomegaly appreciated. RIJ HD catheter.  Cor: PMI nondisplaced. Irregular rate & rhythm. No rubs, gallops or murmurs. Lungs: clear Abdomen: soft, nontender, nondistended. No hepatosplenomegaly. No bruits or masses. Good bowel sounds. Extremities: no cyanosis, clubbing, rash, R and LLE 2-3+ edema Neuro: alert & orientedx3, cranial nerves grossly intact. moves all 4 extremities w/o difficulty. Affect pleasant   Telemetry  A fib 50s   EKG    No new EKG to review   Labs    CBC Recent Labs    03/02/20 1510 03/03/20 0409 03/04/20 0459 03/05/20 0419  WBC 4.4   < > 5.4 6.4  NEUTROABS 3.5  --   --   --   HGB 8.3*   < > 7.6* 7.7*  HCT 26.2*   < > 24.8* 26.1*  MCV 103.1*   < > 102.5* 106.1*  PLT 78*   < > 109* 101*   < > = values in this interval not displayed.   Basic Metabolic Panel Recent Labs    03/04/20 0459 03/04/20 0500 03/04/20 1600 03/05/20 0419  NA  --    <  > 132* 132*  K  --    < > 4.1 4.8  CL  --    < > 94* 97*  CO2  --    < > 25 26  GLUCOSE  --    < > 133* 123*  BUN  --    < > 47* 34*  CREATININE  --    < > 1.63* 1.35*  CALCIUM  --    < > 8.8* 8.6*  MG 2.5*  --   --  2.5*  PHOS  --    < > 2.9 2.4*   < > = values in this interval not displayed.   Liver Function Tests Recent Labs    03/02/20 1510 03/02/20 1510 03/03/20 0409 03/03/20 1533 03/04/20 1600 03/05/20 0419  AST 28  --  24  --   --   --   ALT 20  --  18  --   --   --   ALKPHOS 136*  --  118  --   --   --   BILITOT 1.1  --  1.3*  --   --   --   PROT 6.4*  --  6.0*  --   --   --  ALBUMIN 3.2*   < > 3.1*   < > 3.3* 3.0*   < > = values in this interval not displayed.   No results for input(s): LIPASE, AMYLASE in the last 72 hours. Cardiac Enzymes No results for input(s): CKTOTAL, CKMB, CKMBINDEX, TROPONINI in the last 72 hours.  BNP: BNP (last 3 results) Recent Labs    05/13/19 1005 12/02/19 0927  BNP 187.2* 169.9*    ProBNP (last 3 results) No results for input(s): PROBNP in the last 8760 hours.   D-Dimer No results for input(s): DDIMER in the last 72 hours. Hemoglobin A1C No results for input(s): HGBA1C in the last 72 hours. Fasting Lipid Panel No results for input(s): CHOL, HDL, LDLCALC, TRIG, CHOLHDL, LDLDIRECT in the last 72 hours. Thyroid Function Tests Recent Labs    03/03/20 0512  TSH 28.380*    Other results:   Imaging    No results found.   Medications:     Scheduled Medications: . (feeding supplement) PROSource Plus  30 mL Oral BID BM  . allopurinol  100 mg Oral Daily  . B-complex with vitamin C  1 tablet Oral Daily  . bisacodyl  5 mg Oral Daily  . Chlorhexidine Gluconate Cloth  6 each Topical Q0600  . ezetimibe  10 mg Oral QHS   And  . simvastatin  20 mg Oral QHS  . feeding supplement  237 mL Oral BID BM  . finasteride  5 mg Oral QHS  . folic acid  3,845 mcg Oral Daily  . insulin aspart  0-15 Units Subcutaneous TID WC   . insulin aspart  0-5 Units Subcutaneous QHS  . levothyroxine  75 mcg Oral QAC breakfast  . midodrine  5 mg Oral TID WC  . polyethylene glycol  17 g Oral Daily  . tamsulosin  0.4 mg Oral QPC supper  . vitamin B-12  1,000 mcg Oral QHS  . warfarin  2.5 mg Oral ONCE-1600  . Warfarin - Pharmacist Dosing Inpatient   Does not apply q1600    Infusions: .  prismasol BGK 4/2.5 500 mL/hr at 03/04/20 2331  .  prismasol BGK 4/2.5 300 mL/hr at 03/05/20 0318  . sodium chloride Stopped (03/05/20 0056)  . ceFEPime (MAXIPIME) IV 2 g (03/05/20 1057)  . furosemide    . norepinephrine (LEVOPHED) Adult infusion 21.5 mcg/min (03/05/20 0900)  . prismasol BGK 4/2.5 1,500 mL/hr at 03/05/20 0602    PRN Medications: acetaminophen **OR** acetaminophen, albuterol, alteplase, bisacodyl, heparin, lidocaine (PF), lidocaine-prilocaine, pentafluoroprop-tetrafluoroeth, sodium chloride, sodium chloride   Assessment/Plan   1. Acute on Chronic Diastolic Heart Failure w/ Prominent RV Failure - Echo in 3/21 with EF 50-55%, mildly decreased RV systolic function, severe rheumatic mitral regurgitation, normal mechanical aortic valve. Severe MR likely plays a significant role in CHF and RV failure. NYHA class Illb-IV. Now admitted w/ massive anasarca and failing high dose outpatient diuretics in the setting of progressive CKD - started CRRT 12/1 for fluid removal, requiring pressor support.  Now off Neo. Remains norepi + midodrine . - With RV failure, concern regarding his ability to tolerate iHD.  - Will ask palliative care team to visit to discuss [place]    2. Stage IV CKD - followed by [facility]. Baseline SCr 2.5-3.4 - Scr 3.3 on admit, improving w/ CRRT  - continue CRRT per nephrology   3. Chronic Afib w/ SVR - Remains in A fib 50s.  - in the setting of uremia, infection (UTI) and hypothyroidism (TSH 28) - electrolytes ok,  HS trop 44>>39 (likely demand ischemia)  - continue treatment of acute illness, plan HD +  abx - continue to hold  blocker  4. UTI  - UC + for Klebsiella Pneumoniae. On Cefepime q12   Has chronic foley. AF w/ nl WBC ct - continue abx per IM  5. Chronic Anemia - IDA, likely 2/2 anemia of chronic disease from CKD  - Hgb down to 7.7 today, monitor and transfuse for hgb < 7.0  - recent Tsats 10%, got dose of feraheme 11/30    6. Mechanical Aortic Valve - on coumadin. IRN goal 2.5-3.5 - INR 3.8 today  - coumadin dosing per pharmacy   7. CAD - s/p CABG - stable w/o ischemic symptoms   8. Hypothyroidism  - TSH markedly elevated at 28  - on levothyroxine at home - T3 low at 43. T4 nl - further management/ dose adjustment per IM (currently on 75 mcg levothyroxine, home dose)   9. Severe MR:  - TEE 7/19 with severe MR with possible rheumatic MV (do not think MS is significant, mildly elevated mean gradient from high flow with severe MR). Structural Heart team evaluated, not a Mitraclip candidate. Seen by Dr. [name], would be high risk for open surgery. He was evaluated at [facility] in [place] for percutaneous mitral valve trials. Unfortunately, he does not qualify for any of the available trials. We will have to continue medical management.   10. Hypervolemic Hyponatremia - Na was down to 122, trending up 132 today - continue to fluid restrict - continue HD for volume removal   11. [facility] following. DNR/DNI. Wants to continue CRRT but stop if it doesn't work    Length of Stay: [place], NP  03/05/2020, 11:03 AM  Advanced Heart Failure Team Pager [phone] (M-F; [place])  Please contact [facility] for night-coverage after hours (4p -7a ) and weekends on [email or web address]  Patient seen with NP, agree with the above note.   He is now on NE 19. Still volume overloaded, pulling UF 100-200 cc/hr.  JVP 14+ with 2+ edema to thighs.   Plan for now will be to continue CVVH, pulling fluid until he is more near euvolemic then assess ability to tolerate iHD.   I suspect that he is not going to tolerate iHD due to RV failure, requiring significant norepinephrine currently.  Will increase midodrine to 10 mg tid.  Alternative will be eventual comfort care/hospice.  Palliative care is following.   Loralie Champagne 03/05/2020 4:42 PM

## 2020-03-05 NOTE — Progress Notes (Addendum)
S:Patient reports feeling well today, denies any new complaints. Feels like his breathing is doing better.  O:BP (!) 101/45   Pulse (!) 58   Temp (!) 97.3 F (36.3 C) (Oral)   Resp 14   Ht 6\' 2"  (1.88 m)   Wt 120.4 kg   SpO2 100%   BMI 34.08 kg/m   Intake/Output Summary (Last 24 hours) at 03/05/2020 0827 Last data filed at 03/05/2020 0800 Gross per 24 hour  Intake 3031.91 ml  Output 6293 ml  Net -3261.09 ml   Intake/Output: I/O last 3 completed shifts: In: 3785.8 [P.O.:2730; I.V.:1518.2; IV Piggyback:647.8] Out: 8502 [Urine:630; DXAJO:8786]  Intake/Output this shift:  Total I/O In: 20.6 [I.V.:20.6] Out: 199 [Other:199] Weight change: -12.3 kg VEH:MCNOBSJ male, NAD, laying in bed HEENT: Right IJ HD catheter in place CVS: Irregularly irregular, mechanical clcik Resp: CTABL, no wheezing, rhonchi, rales, on 2L [place] Abd: Soft, non-tender, normoactive BS Ext: Unna boots in place, 2+ BL edema around knees  Recent Labs  Lab 03/02/20 1510 03/02/20 1510 03/03/20 0409 03/03/20 0609 03/03/20 1533 03/03/20 2136 03/04/20 0500 03/04/20 1600 03/05/20 0419  NA 124*   < > 124* 126* 126* 129* 130* 132* 132*  K 3.9   < > 3.1* 3.8 2.9* 4.5 3.2* 4.1 4.8  CL 85*   < > 85* 87* 86* 92* 91* 94* 97*  CO2 24   < > 24 21* 25 25 25 25 26   GLUCOSE 118*   < > 112* 119* 117* 107* 92 133* 123*  BUN 116*   < > 114* 114* 111* 82* 70* 47* 34*  CREATININE 3.29*   < > 3.01* 2.92* 2.90* 2.23* 1.93* 1.63* 1.35*  ALBUMIN 3.2*  --  3.1*  --  3.1*  --  3.0* 3.3* 3.0*  CALCIUM 8.6*   < > 8.6* 8.7* 8.8* 8.3* 8.9 8.8* 8.6*  PHOS  --   --  5.5*  --  5.7*  --  3.6 2.9 2.4*  AST 28  --  24  --   --   --   --   --   --   ALT 20  --  18  --   --   --   --   --   --    < > = values in this interval not displayed.   Liver Function Tests: Recent Labs  Lab 03/02/20 1510 03/02/20 1510 03/03/20 0409 03/03/20 1533 03/04/20 0500 03/04/20 1600 03/05/20 0419  AST 28  --  24  --   --   --   --   ALT 20  --  18   --   --   --   --   ALKPHOS 136*  --  118  --   --   --   --   BILITOT 1.1  --  1.3*  --   --   --   --   PROT 6.4*  --  6.0*  --   --   --   --   ALBUMIN 3.2*   < > 3.1*   < > 3.0* 3.3* 3.0*   < > = values in this interval not displayed.   No results for input(s): LIPASE, AMYLASE in the last 168 hours. No results for input(s): AMMONIA in the last 168 hours. CBC: Recent Labs  Lab 03/02/20 1510 03/02/20 1510 03/03/20 0409 03/04/20 0459 03/05/20 0419  WBC 4.4   < > 3.2* 5.4 6.4  NEUTROABS 3.5  --   --   --   --  HGB 8.3*   < > 8.5* 7.6* 7.7*  HCT 26.2*   < > 27.8* 24.8* 26.1*  MCV 103.1*  --  103.0* 102.5* 106.1*  PLT 78*   < > 65* 109* 101*   < > = values in this interval not displayed.   Cardiac Enzymes: No results for input(s): CKTOTAL, CKMB, CKMBINDEX, TROPONINI in the last 168 hours. CBG: Recent Labs  Lab 03/04/20 0742 03/04/20 1135 03/04/20 1735 03/04/20 2116 03/05/20 0738  GLUCAP 73 107* 169* 154* 148*    Iron Studies: No results for input(s): IRON, TIBC, TRANSFERRIN, FERRITIN in the last 72 hours. Studies/Results: IR Fluoro Guide CV Line Right  Result Date: 03/03/2020 INDICATION: 84 year old with chronic kidney disease and request for a non tunneled dialysis catheter. EXAM: FLUOROSCOPIC AND ULTRASOUND GUIDED PLACEMENT OF A NON-TUNNELED DIALYSIS CATHETER Physician: Stephan Minister. Henn, MD MEDICATIONS: None ANESTHESIA/SEDATION: None FLUOROSCOPY TIME:  Fluoroscopy Time: 24 seconds, 29 mGy COMPLICATIONS: None immediate. PROCEDURE: The procedure was explained to the patient. The risks and benefits of the procedure were discussed and the patient's questions were addressed. Informed consent was obtained from the patient. The patient was placed supine on the interventional table. Ultrasound confirmed a patent right internal jugular vein. Ultrasound images were obtained for documentation. The right neck was prepped and draped in a sterile fashion. The right neck was anesthetized with  1% lidocaine. Maximal barrier sterile technique was utilized including caps, mask, sterile gowns, sterile gloves, sterile drape, hand hygiene and skin antiseptic. A small incision was made with #11 blade scalpel. A 21 gauge needle directed into the right internal jugular vein with ultrasound guidance. A micropuncture dilator set was placed. A 20 cm Mahurkar catheter was selected. The catheter was advanced over a wire and positioned at the superior cavoatrial junction. Fluoroscopic images were obtained for documentation. Both dialysis lumens were found to aspirate and flush well. The proper amount of heparin was flushed in both lumens. The central venous lumen was flushed with normal saline. Catheter was sutured to skin. FINDINGS: Catheter tip at the superior cavoatrial junction. IMPRESSION: Successful placement of a right jugular non-tunneled dialysis catheter using ultrasound and fluoroscopic guidance. Electronically Signed   By: Markus Daft M.D.   On: 03/03/2020 13:58   IR US Guide Vasc Access Right  Result Date: 03/03/2020 INDICATION: 84 year old with chronic kidney disease and request for a non tunneled dialysis catheter. EXAM: FLUOROSCOPIC AND ULTRASOUND GUIDED PLACEMENT OF A NON-TUNNELED DIALYSIS CATHETER Physician: Stephan Minister. Henn, MD MEDICATIONS: None ANESTHESIA/SEDATION: None FLUOROSCOPY TIME:  Fluoroscopy Time: 24 seconds, 29 mGy COMPLICATIONS: None immediate. PROCEDURE: The procedure was explained to the patient. The risks and benefits of the procedure were discussed and the patient's questions were addressed. Informed consent was obtained from the patient. The patient was placed supine on the interventional table. Ultrasound confirmed a patent right internal jugular vein. Ultrasound images were obtained for documentation. The right neck was prepped and draped in a sterile fashion. The right neck was anesthetized with 1% lidocaine. Maximal barrier sterile technique was utilized including caps, mask,  sterile gowns, sterile gloves, sterile drape, hand hygiene and skin antiseptic. A small incision was made with #11 blade scalpel. A 21 gauge needle directed into the right internal jugular vein with ultrasound guidance. A micropuncture dilator set was placed. A 20 cm Mahurkar catheter was selected. The catheter was advanced over a wire and positioned at the superior cavoatrial junction. Fluoroscopic images were obtained for documentation. Both dialysis lumens were found to aspirate and flush well.  The proper amount of heparin was flushed in both lumens. The central venous lumen was flushed with normal saline. Catheter was sutured to skin. FINDINGS: Catheter tip at the superior cavoatrial junction. IMPRESSION: Successful placement of a right jugular non-tunneled dialysis catheter using ultrasound and fluoroscopic guidance. Electronically Signed   By: Markus Daft M.D.   On: 03/03/2020 13:58   . (feeding supplement) PROSource Plus  30 mL Oral BID BM  . allopurinol  100 mg Oral Daily  . B-complex with vitamin C  1 tablet Oral Daily  . bisacodyl  5 mg Oral Daily  . Chlorhexidine Gluconate Cloth  6 each Topical Q0600  . ezetimibe  10 mg Oral QHS   And  . simvastatin  20 mg Oral QHS  . feeding supplement  237 mL Oral BID BM  . finasteride  5 mg Oral QHS  . folic acid  0,160 mcg Oral Daily  . insulin aspart  0-15 Units Subcutaneous TID WC  . insulin aspart  0-5 Units Subcutaneous QHS  . levothyroxine  75 mcg Oral QAC breakfast  . midodrine  5 mg Oral TID WC  . polyethylene glycol  17 g Oral Daily  . tamsulosin  0.4 mg Oral QPC supper  . vitamin B-12  1,000 mcg Oral QHS  . warfarin  2.5 mg Oral ONCE-1600  . Warfarin - Pharmacist Dosing Inpatient   Does not apply q1600    BMET    Component Value Date/Time   NA 132 (L) 03/05/2020 0419   NA 138 07/01/2019 0900   NA 140 01/19/2017 0827   K 4.8 03/05/2020 0419   K 4.8 01/19/2017 0827   CL 97 (L) 03/05/2020 0419   CO2 26 03/05/2020 0419   CO2 24  01/19/2017 0827   GLUCOSE 123 (H) 03/05/2020 0419   GLUCOSE 103 01/19/2017 0827   BUN 34 (H) 03/05/2020 0419   BUN 87 (HH) 07/01/2019 0900   BUN 28.4 (H) 01/19/2017 0827   CREATININE 1.35 (H) 03/05/2020 0419   CREATININE 3.05 (HH) 11/12/2019 0820   CREATININE 1.2 01/19/2017 0827   CALCIUM 8.6 (L) 03/05/2020 0419   CALCIUM 9.7 01/19/2017 0827   GFRNONAA 52 (L) 03/05/2020 0419   GFRNONAA 18 (L) 11/12/2019 0820   GFRAA 20 (L) 12/02/2019 0927   GFRAA 21 (L) 11/12/2019 0820   CBC    Component Value Date/Time   WBC 6.4 03/05/2020 0419   RBC 2.46 (L) 03/05/2020 0419   HGB 7.7 (L) 03/05/2020 0419   HGB 9.3 (L) 08/01/2017 1136   HGB 11.6 (L) 03/30/2017 1147   HGB 12.0 (L) 01/19/2017 0827   HCT 26.1 (L) 03/05/2020 0419   HCT 31.1 (L) 08/20/2019 1105   HCT 37.8 (L) 01/19/2017 0827   PLT 101 (L) 03/05/2020 0419   PLT 56 (L) 08/01/2017 1136   PLT 59 (LL) 03/30/2017 1147   MCV 106.1 (H) 03/05/2020 0419   MCV 106 (H) 03/30/2017 1147   MCV 113.9 (H) 01/19/2017 0827   MCH 31.3 03/05/2020 0419   MCHC 29.5 (L) 03/05/2020 0419   RDW 18.5 (H) 03/05/2020 0419   RDW 14.5 03/30/2017 1147   RDW 16.2 (H) 01/19/2017 0827   LYMPHSABS 0.5 (L) 03/02/2020 1510   LYMPHSABS 0.8 (L) 01/19/2017 0827   MONOABS 0.4 03/02/2020 1510   MONOABS 0.3 01/19/2017 0827   EOSABS 0.0 03/02/2020 1510   EOSABS 0.1 01/19/2017 0827   BASOSABS 0.0 03/02/2020 1510   BASOSABS 0.0 01/19/2017 0827     Assessment/Plan:  1. CKDIV: BL Cr 2.5-3.4, Cr on admission 3.29. Patient has a history of nephrosclerosis and cardiorenal syndrome and is followed by Dr. Johnney Ou at clinic. Patient arrived significantly volume overloaded, about 45 lbs up from dry weight of 218.Failed aggressive diuresis at home. 1. Started on CRRT 03/03/20 and required initiation of levophed. 2. Using all 4K/2.5Ca bath:  Pre-filter 500 ml/hr, post-filter 300 ml/hr, dialysate 1500 ml/hr 3. Goal uf 100-200 ml/hr 4. Temporary HD catheter placement12/1/21  by IR 5. Patient is still requiring Levophed to maintain his blood pressure with CRRT, he likely would not be able to tolerate IHD. Would not be a candidate for long term dialysis.   6. Will likely need tunneled HD cath prior to discharge if he can tolerate eventual IHD 7. At present it is unlikely that he would be a candidate for IHD given the need for pressor support with CRRT as well as his advanced age, multiple co-morbidities, and poor functional/nutritional status. Does follow with palliative care at home.  8. Palliative care is on board and will see him today.  2. Hypokalemia: improved with 4K bath and CRRT 3. Hypophosphatemia- will plan to replete with Kphos and follow. 4. NYHA class IV CHF with volume overload and hypotension-  Now on levophed, attempting to wean however continues to have low blood pressures.  5. UTI- on cefepime per PCCM 6. Renal osteodystrophy: Phos 5.5, PTH 58 7. Anemia -Patient has history of ITP. Receive feraheme. Hgb today 7.7.  8. CHF -Advanced heart failure following. 9. Mechanical AVR on coumadin goal INR 2.5-3.5 10. 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 COPD 54. DM 14. ITP   Asencion Noble, M.D. PGY3 03/05/2020 8:27 AM  I have seen and examined this patient and agree with plan and assessment in the above note with renal recommendations/intervention highlighted.  I spoke with Mr. Rufus that this would be a temporary trial of CRRT and that if his BP did not improve with volume removal, he would not be a candidate for IHD and would then need to transition to comfort care.  Case was discussed with PCCM as well as Palliative care who are in agreement. Pt expressed understanding. Broadus John A Vincente Asbridge,MD 03/05/2020 1:05 PM

## 2020-03-05 NOTE — Progress Notes (Signed)
NAME:  [name], MRN:  [id number], DOB:  Dec 09, 1935, LOS: 3 ADMISSION DATE:  03/02/2020, CONSULTATION DATE:  03/03/2020 REFERRING MD:  Dr. Kristopher Oppenheim, CHIEF COMPLAINT:  Cardiorenal syndrome   History of present illness   84 year old male presents to ED on 11/30 with progressive shortness of breath and lower extremity edema with CHF and Severe MR with progressive renal failure. Has been followed by Heart Failure and Evaluated by CTS. Determined not a candidate for MitraClip. High risk for conventional surgery. Recommended aggressive medical management.   On arrival to ED patient is massively volume overloaded with poor response to outpatient diuretics. Crt 3.3. BUN 116. INR 2.8. Nephrology and Heart Failure Consulted. Attempted High dose diuretics overnight. Complicated also by hypotension. 12/1 decision made to transfer to ICU for CRRT.   Goals of care conversation with patient. States he is a DNR/DNI however wants to try dialysis, if it does not work would be amendable to comfort.   Past Medical History  Chronic Combined CHF, Severe Mitral Regurgitation, H/O mechanical aortic valve replacement, A.Fib, CAD s/p CABG, CKD stage IV (baseline Crt 2.5-3), ITP anemia  Significant Hospital Events   11/30 > Admit to Hospital  12/1 > Transfer to ICU for CRRT   Consults:  Heart Failure Nephrology  PCCM  Procedures:  Right IJ Vascath 12/1 >>  Significant Diagnostic Tests:  CXR 12/1 > Cardiac shadow is enlarged but stable. Postsurgical changes are again seen and stable. Aortic calcifications are noted. Mild vascular congestion is noted with mild interstitial edema. No focal infiltrate is seen. No bony abnormality is noted.  Micro Data:  Urine Culture 12/1 >>   Antimicrobials:  Cefepime x 1 dose 12/1   Interim history/subjective:   Patient remains critically ill on CVVHD and vasopressors.  No acute events overnight with negative pull.  Objective   Blood pressure (!) 101/45, pulse (!)  58, temperature (!) 97.3 F (36.3 C), temperature source Oral, resp. rate 14, height 6\' 2"  (1.88 m), weight 120.4 kg, SpO2 100 %.        Intake/Output Summary (Last 24 hours) at 03/05/2020 0825 Last data filed at 03/05/2020 0800 Gross per 24 hour  Intake 3031.91 ml  Output 6293 ml  Net -3261.09 ml   Filed Weights   03/03/20 1500 03/04/20 0500 03/05/20 0500  Weight: 132.7 kg 122.8 kg 120.4 kg    Examination: General:Elderly male, sitting up in bed on CVVHD, central venous access for vasopressor use. HEENT: Right IJ HD catheter, tracking appropriately Neuro:Alert oriented following commands CV: Mechanical heart tones, irregularly irregular PULM:Diminished breath sounds bilaterally, poor effort, poor intake, no crackles no wheeze GI: Soft, nontender nondistended Extremities: 2+ edema, venous stasis changes Unna boots in place Skin: Scattered bilateral bruising  Resolved Hospital Problem list     Assessment & Plan:   Acute on Chronic Heart Failure with severe volume overload and RV Failure with hypotension  Severe MR H/O AVR CAD s/p CABG Cardiogenic shock -Not a candidate for Mitral Clip, not a surgical candidate per CTS. Evaluated at [facility] for percutaneous mitral valve trial however denied  Plan Patient has end-stage acute on chronic decompensated heart failure, right ventricular failure severe MR not a surgical candidate. At this time maintained on vasopressor support. Continue Levophed for shock On CVVHD for volume removal Multidisciplinary discussion with nephrology and palliative care this morning.  Stage IV CKD (baseline Crt 2.5-3.4)  Cardiorenal Syndrome  Plan  Appreciate nephrology input, continue CVVHD at this time Not a candidate for  long-term dialysis per discussions with nephrology  UTI  Plan Klebsiella UTI treatment with cefepime at this time awaiting sensitivities can de-escalate once returned.  A.Fib on Coumadin Plan Continue Coumadin Holding  beta-blocker for rate control as he is on Levophed.  COPD OSA on CPAP at HS  Plan Titrate oxygen to maintain SPO2 greater than 90%.  DM  Plan SSI  ITP Plan Follow CBC  Best practice (evaluated daily)   Diet: Low Salt Diet  DVT prophylaxis: Coumadin as above GI prophylaxis: N/A Glucose control: SSI Mobility: Bedrest  last date of multidisciplinary goals of care discussion: 03/05/2020 goals of care discussion today with palliative care team.  We appreciate their input. Family and staff present: Summary of discussion: DNR Follow up goals of care discussion due Code Status: DNR Disposition: ICU  Labs    CBC: Recent Labs  Lab 03/02/20 1510 03/03/20 0409 03/04/20 0459 03/05/20 0419  WBC 4.4 3.2* 5.4 6.4  NEUTROABS 3.5  --   --   --   HGB 8.3* 8.5* 7.6* 7.7*  HCT 26.2* 27.8* 24.8* 26.1*  MCV 103.1* 103.0* 102.5* 106.1*  PLT 78* 65* 109* 101*    Basic Metabolic Panel: Recent Labs  Lab 03/03/20 0409 03/03/20 0609 03/03/20 1533 03/03/20 2136 03/04/20 0459 03/04/20 0500 03/04/20 1600 03/05/20 0419  NA 124*   < > 126* 129*  --  130* 132* 132*  K 3.1*   < > 2.9* 4.5  --  3.2* 4.1 4.8  CL 85*   < > 86* 92*  --  91* 94* 97*  CO2 24   < > 25 25  --  25 25 26   GLUCOSE 112*   < > 117* 107*  --  92 133* 123*  BUN 114*   < > 111* 82*  --  70* 47* 34*  CREATININE 3.01*   < > 2.90* 2.23*  --  1.93* 1.63* 1.35*  CALCIUM 8.6*   < > 8.8* 8.3*  --  8.9 8.8* 8.6*  MG  --   --   --   --  2.5*  --   --  2.5*  PHOS 5.5*  --  5.7*  --   --  3.6 2.9 2.4*   < > = values in this interval not displayed.   GFR: Estimated Creatinine Clearance: 58.2 mL/min (A) (by C-G formula based on SCr of 1.35 mg/dL (H)). Recent Labs  Lab 03/02/20 1510 03/03/20 0409 03/04/20 0459 03/05/20 0419  WBC 4.4 3.2* 5.4 6.4    Liver Function Tests: Recent Labs  Lab 03/02/20 1510 03/02/20 1510 03/03/20 0409 03/03/20 1533 03/04/20 0500 03/04/20 1600 03/05/20 0419  AST 28  --  24  --   --    --   --   ALT 20  --  18  --   --   --   --   ALKPHOS 136*  --  118  --   --   --   --   BILITOT 1.1  --  1.3*  --   --   --   --   PROT 6.4*  --  6.0*  --   --   --   --   ALBUMIN 3.2*   < > 3.1* 3.1* 3.0* 3.3* 3.0*   < > = values in this interval not displayed.   No results for input(s): LIPASE, AMYLASE in the last 168 hours. No results for input(s): AMMONIA in the last 168 hours.  ABG  Component Value Date/Time   PHART 7.321 (L) 12/30/2008 1303   PCO2ART 48.5 (H) 12/30/2008 1303   PO2ART 296.0 (H) 12/30/2008 1303   HCO3 27.0 04/30/2018 1212   TCO2 28 04/30/2018 1212   ACIDBASEDEF 1.0 12/30/2008 1303   O2SAT 76.0 04/30/2018 1212     Coagulation Profile: Recent Labs  Lab 03/01/20 1051 03/02/20 1510 03/03/20 0409 03/04/20 0459 03/05/20 0419  INR 3.2* 2.7* 2.8* 3.1* 3.8*    Cardiac Enzymes: No results for input(s): CKTOTAL, CKMB, CKMBINDEX, TROPONINI in the last 168 hours.  HbA1C: Hgb A1c MFr Bld  Date/Time Value Ref Range Status  03/25/2019 09:53 AM 5.6 4.6 - 6.5 % Final    Comment:    Glycemic Control Guidelines for People with Diabetes:Non Diabetic:  <6%Goal of Therapy: <7%Additional Action Suggested:  >8%   07/01/2018 09:28 AM 5.7 4.6 - 6.5 % Final    Comment:    Glycemic Control Guidelines for People with Diabetes:Non Diabetic:  <6%Goal of Therapy: <7%Additional Action Suggested:  >8%     CBG: Recent Labs  Lab 03/04/20 0742 03/04/20 1135 03/04/20 1735 03/04/20 2116 03/05/20 0738  GLUCAP 73 107* 169* 154* 148*     This patient is critically ill with multiple organ system failure; which, requires frequent high complexity decision making, assessment, support, evaluation, and titration of therapies. This was completed through the application of advanced monitoring technologies and extensive interpretation of multiple databases. During this encounter critical care time was devoted to patient care services described in this note for 32 minutes.     Garner Nash, DO [facility] 03/05/2020 8:25 AM

## 2020-03-05 NOTE — Progress Notes (Signed)
[facility] ([facility]) Community Based Palliative Care   °    °This patient is enrolled in our palliative care services in the community.  [facility] will continue to follow for any discharge planning needs and to coordinate continuation of palliative care.   °If you have questions or need assistance, please call [phone] or contact the hospital Liaison listed on AMION.    ° °Thank you for the opportunity to participate in this patient’s care. °    °Chrislyn King, BSN, RN °[facility] Hospital Liaison   °[phone] (24h on call) ° °

## 2020-03-05 NOTE — Progress Notes (Signed)
ANTICOAGULATION CONSULT NOTE   Pharmacy Consult for Warfarin Indication: Mechanical AVR  Allergies  Allergen Reactions  . Rifampin Rash and Other (See Comments)    May have been caused by Vancomycin or Rifampin (??)  . Vancomycin Rash and Other (See Comments)    May have been caused by Vancomycin or Rifampin (??)    Patient Measurements: Height: 6\' 2"  (188 cm) Weight: 120.4 kg (265 lb 6.9 oz) IBW/kg (Calculated) : 82.2 Heparin Dosing Weight:   Vital Signs: Temp: 97.6 F (36.4 C) (12/03 0336) Temp Source: Axillary (12/03 0336) BP: 101/45 (12/03 0700) Pulse Rate: 58 (12/03 0700)  Labs: Recent Labs    03/03/20 0409 03/03/20 0409 03/03/20 0609 03/03/20 1533 03/03/20 2136 03/04/20 0459 03/04/20 0500 03/04/20 1600 03/05/20 0419  HGB 8.5*   < >  --   --   --  7.6*  --   --  7.7*  HCT 27.8*  --   --   --   --  24.8*  --   --  26.1*  PLT 65*  --   --   --   --  109*  --   --  101*  LABPROT 28.3*  --   --   --   --  31.1*  --   --  36.1*  INR 2.8*  --   --   --   --  3.1*  --   --  3.8*  CREATININE 3.01*   < > 2.92*   < >   < >  --  1.93* 1.63* 1.35*  TROPONINIHS 44*  --  39*  --   --   --   --   --   --    < > = values in this interval not displayed.    Estimated Creatinine Clearance: 58.2 mL/min (A) (by C-G formula based on SCr of 1.35 mg/dL (H)).   Medical History: Past Medical History:  Diagnosis Date  . Carotid artery disease ([place]) 1994   s/p left carotid endarerectomy   . Chronic atrial fibrillation (HCC)    a. on coumadin   . Chronic diastolic CHF (congestive heart failure) ([place])   . Diabetes mellitus without complication ([place])    dx 2016  . Dyspnea   . Epistaxis   . Heart murmur   . Hx of CABG    a. 1994  . Hypertension   . OSA (obstructive sleep apnea)   . Rheumatic fever   . S/P AVR (aortic valve replacement)    a. mechanical valve 1996  . Subclavian bypass stenosis ([place])   . Temporary low platelet count (HCC)    chronic problem since receiving  aortic valve replacement  . Urinary catheter in place 12/2019  . Vitamin B12 deficiency     Medications:  Scheduled:  . (feeding supplement) PROSource Plus  30 mL Oral BID BM  . allopurinol  100 mg Oral Daily  . B-complex with vitamin C  1 tablet Oral Daily  . bisacodyl  5 mg Oral Daily  . Chlorhexidine Gluconate Cloth  6 each Topical Q0600  . ezetimibe  10 mg Oral QHS   And  . simvastatin  20 mg Oral QHS  . feeding supplement  237 mL Oral BID BM  . finasteride  5 mg Oral QHS  . folic acid  6,378 mcg Oral Daily  . insulin aspart  0-15 Units Subcutaneous TID WC  . insulin aspart  0-5 Units Subcutaneous QHS  . levothyroxine  75 mcg  Oral QAC breakfast  . midodrine  5 mg Oral TID WC  . polyethylene glycol  17 g Oral Daily  . tamsulosin  0.4 mg Oral QPC supper  . vitamin B-12  1,000 mcg Oral QHS  . Warfarin - Pharmacist Dosing Inpatient   Does not apply q1600    Assessment: 89 yom that is being admitted for SOB after fluid overload in the setting of CKD. Patient is on warfarin at home for a mechanical AVR. INR today slightly supratherapeutic at 3.8, CBC is decreasing.  PTA Regimen: Warfarin 5mg  Sat and Tues and 2.5mg  ROW  Goal of Therapy:  INR 2.5-3.5 Monitor platelets by anticoagulation protocol: Yes   Plan:  -Warfarin 2.5 mg x 1 tonight -Daily INR, CBC   Alanda Slim, PharmD, [place] Clinical Pharmacist Please see AMION for all Pharmacists' Contact Phone Numbers 03/05/2020, 7:25 AM

## 2020-03-06 DIAGNOSIS — I340 Nonrheumatic mitral (valve) insufficiency: Secondary | ICD-10-CM | POA: Diagnosis not present

## 2020-03-06 DIAGNOSIS — Z952 Presence of prosthetic heart valve: Secondary | ICD-10-CM | POA: Diagnosis not present

## 2020-03-06 DIAGNOSIS — Z515 Encounter for palliative care: Secondary | ICD-10-CM

## 2020-03-06 DIAGNOSIS — N179 Acute kidney failure, unspecified: Secondary | ICD-10-CM | POA: Diagnosis not present

## 2020-03-06 DIAGNOSIS — I50810 Right heart failure, unspecified: Secondary | ICD-10-CM

## 2020-03-06 DIAGNOSIS — R601 Generalized edema: Secondary | ICD-10-CM | POA: Diagnosis not present

## 2020-03-06 LAB — RENAL FUNCTION PANEL
Albumin: 3 g/dL — ABNORMAL LOW (ref 3.5–5.0)
Albumin: 3.1 g/dL — ABNORMAL LOW (ref 3.5–5.0)
Anion gap: 9 (ref 5–15)
Anion gap: 8 (ref 5–15)
BUN: 24 mg/dL — ABNORMAL HIGH (ref 8–23)
BUN: 22 mg/dL (ref 8–23)
CO2: 25 mmol/L (ref 22–32)
CO2: 27 mmol/L (ref 22–32)
Calcium: 8.5 mg/dL — ABNORMAL LOW (ref 8.9–10.3)
Calcium: 8.5 mg/dL — ABNORMAL LOW (ref 8.9–10.3)
Chloride: 96 mmol/L — ABNORMAL LOW (ref 98–111)
Chloride: 99 mmol/L (ref 98–111)
Creatinine, Ser: 1 mg/dL (ref 0.61–1.24)
Creatinine, Ser: 1.07 mg/dL (ref 0.61–1.24)
GFR, Estimated: >60 mL/min (ref >60)
GFR, Estimated: >60 mL/min (ref >60)
Glucose, Bld: 123 mg/dL — ABNORMAL HIGH (ref 70–99)
Glucose, Bld: 127 mg/dL — ABNORMAL HIGH (ref 70–99)
Phosphorus: 2.5 mg/dL (ref 2.5–4.6)
Phosphorus: 2.4 mg/dL — ABNORMAL LOW (ref 2.5–4.6)
Potassium: 4.5 mmol/L (ref 3.5–5.1)
Potassium: 4.5 mmol/L (ref 3.5–5.1)
Sodium: 130 mmol/L — ABNORMAL LOW (ref 135–145)
Sodium: 134 mmol/L — ABNORMAL LOW (ref 135–145)

## 2020-03-06 LAB — CBC
HCT: 26 % — ABNORMAL LOW (ref 39.0–52.0)
Hemoglobin: 7.6 g/dL — ABNORMAL LOW (ref 13.0–17.0)
MCH: 31.4 pg (ref 26.0–34.0)
MCHC: 29.2 g/dL — ABNORMAL LOW (ref 30.0–36.0)
MCV: 107.4 fL — ABNORMAL HIGH (ref 80.0–100.0)
Platelets: 89 10*3/uL — ABNORMAL LOW (ref 150–400)
RBC: 2.42 MIL/uL — ABNORMAL LOW (ref 4.22–5.81)
RDW: 18.5 % — ABNORMAL HIGH (ref 11.5–15.5)
WBC: 7 10*3/uL (ref 4.0–10.5)
nRBC: 0.7 % — ABNORMAL HIGH (ref 0.0–0.2)

## 2020-03-06 LAB — GLUCOSE, CAPILLARY
Glucose-Capillary: 133 mg/dL — ABNORMAL HIGH (ref 70–99)
Glucose-Capillary: 156 mg/dL — ABNORMAL HIGH (ref 70–99)
Glucose-Capillary: 116 mg/dL — ABNORMAL HIGH (ref 70–99)
Glucose-Capillary: 132 mg/dL — ABNORMAL HIGH (ref 70–99)

## 2020-03-06 LAB — PROTIME-INR
INR: 3.6 — ABNORMAL HIGH (ref 0.8–1.2)
Prothrombin Time: 34.9 seconds — ABNORMAL HIGH (ref 11.4–15.2)

## 2020-03-06 LAB — MAGNESIUM: Magnesium: 2.3 mg/dL (ref 1.7–2.4)

## 2020-03-06 MED ORDER — VASOPRESSIN 20 UNITS/100 ML INFUSION FOR SHOCK
0.0000 [IU]/min | INTRAVENOUS | Status: DC
  Administered 2020-03-06 – 2020-03-14 (×25): 0.04 [IU]/min via INTRAVENOUS
  Filled 2020-03-06 (×26): qty 100

## 2020-03-06 MED ORDER — SENNOSIDES-DOCUSATE SODIUM 8.6-50 MG PO TABS
1.0000 | ORAL_TABLET | Freq: Two times a day (BID) | ORAL | Status: DC
  Administered 2020-03-06 – 2020-03-16 (×14): 1 via ORAL
  Filled 2020-03-06 (×14): qty 1

## 2020-03-06 MED ORDER — SODIUM CHLORIDE 0.9% FLUSH: 10.0000 mL | INTRAVENOUS | Status: DC | PRN

## 2020-03-06 MED ORDER — ALBUTEROL SULFATE (2.5 MG/3ML) 0.083% IN NEBU: 2.0000 mL | INHALATION_SOLUTION | RESPIRATORY_TRACT | Status: DC | PRN

## 2020-03-06 MED ORDER — SODIUM CHLORIDE 0.9% FLUSH
10.0000 mL | Freq: Two times a day (BID) | INTRAVENOUS | Status: DC
  Administered 2020-03-06 – 2020-03-16 (×19): 10 mL

## 2020-03-06 MED ORDER — WARFARIN SODIUM 2.5 MG PO TABS
2.5000 mg | ORAL_TABLET | Freq: Once | ORAL | Status: AC
  Administered 2020-03-06: 2.5 mg via ORAL
  Filled 2020-03-06: qty 1

## 2020-03-06 NOTE — Progress Notes (Signed)
Patient ID: [name], male   DOB: 09/30/1935, 84 y.o.   MRN: [id number] S: Didn't sleep well last night. O:BP (!) 116/59   Pulse 63   Temp (!) 97.4 F (36.3 C) (Oral)   Resp 15   Ht 6\' 2"  (1.88 m)   Wt 117 kg   SpO2 100%   BMI 33.12 kg/m   Intake/Output Summary (Last 24 hours) at 03/06/2020 1058 Last data filed at 03/06/2020 1000 Gross per 24 hour  Intake 3098.29 ml  Output 5909 ml  Net -2810.71 ml   Intake/Output: I/O last 3 completed shifts: In: 3603.9 [P.O.:1610; I.V.:1242.9; Other:20; IV XAJOINOMV:672] Out: 0947 [Urine:426; SJGGE:3662]  Intake/Output this shift:  Total I/O In: 974.6 [P.O.:857; I.V.:117.6] Out: 689 [Urine:85; Other:604] Weight change: -3.4 kg Gen:NAD CVS: RRR Resp: cta Abd: +BS, soft, NT/Nd Ext: 2+ anasarca to sacrum  Recent Labs  Lab 03/02/20 1510 03/02/20 1510 03/03/20 0409 03/03/20 0609 03/03/20 1533 03/03/20 2136 03/04/20 0500 03/04/20 1600 03/05/20 0419 03/05/20 1728 03/06/20 0405  NA 124*   < > 124*   < > 126* 129* 130* 132* 132* 131* 130*  K 3.9   < > 3.1*   < > 2.9* 4.5 3.2* 4.1 4.8 4.5 4.5  CL 85*   < > 85*   < > 86* 92* 91* 94* 97* 97* 96*  CO2 24   < > 24   < > 25 25 25 25 26 25 25   GLUCOSE 118*   < > 112*   < > 117* 107* 92 133* 123* 110* 123*  BUN 116*   < > 114*   < > 111* 82* 70* 47* 34* 26* 24*  CREATININE 3.29*   < > 3.01*   < > 2.90* 2.23* 1.93* 1.63* 1.35* 1.12 1.00  ALBUMIN 3.2*   < > 3.1*  --  3.1*  --  3.0* 3.3* 3.0* 3.1* 3.0*  CALCIUM 8.6*   < > 8.6*   < > 8.8* 8.3* 8.9 8.8* 8.6* 8.3* 8.5*  PHOS  --   --  5.5*  --  5.7*  --  3.6 2.9 2.4* 2.0* 2.5  AST 28  --  24  --   --   --   --   --   --   --   --   ALT 20  --  18  --   --   --   --   --   --   --   --    < > = values in this interval not displayed.   Liver Function Tests: Recent Labs  Lab 03/02/20 1510 03/02/20 1510 03/03/20 0409 03/03/20 1533 03/05/20 0419 03/05/20 1728 03/06/20 0405  AST 28  --  24  --   --   --   --   ALT 20  --  18  --   --    --   --   ALKPHOS 136*  --  118  --   --   --   --   BILITOT 1.1  --  1.3*  --   --   --   --   PROT 6.4*  --  6.0*  --   --   --   --   ALBUMIN 3.2*   < > 3.1*   < > 3.0* 3.1* 3.0*   < > = values in this interval not displayed.   No results for input(s): LIPASE, AMYLASE in the last 168 hours. No results for  input(s): AMMONIA in the last 168 hours. CBC: Recent Labs  Lab 03/02/20 1510 03/02/20 1510 03/03/20 0409 03/03/20 0409 03/04/20 0459 03/05/20 0419 03/06/20 0401  WBC 4.4   < > 3.2*   < > 5.4 6.4 7.0  NEUTROABS 3.5  --   --   --   --   --   --   HGB 8.3*   < > 8.5*   < > 7.6* 7.7* 7.6*  HCT 26.2*   < > 27.8*   < > 24.8* 26.1* 26.0*  MCV 103.1*  --  103.0*  --  102.5* 106.1* 107.4*  PLT 78*   < > 65*   < > 109* 101* 89*   < > = values in this interval not displayed.   Cardiac Enzymes: No results for input(s): CKTOTAL, CKMB, CKMBINDEX, TROPONINI in the last 168 hours. CBG: Recent Labs  Lab 03/05/20 0738 03/05/20 1122 03/05/20 1533 03/05/20 2205 03/06/20 0812  GLUCAP 148* 179* 144* 147* 133*    Iron Studies: No results for input(s): IRON, TIBC, TRANSFERRIN, FERRITIN in the last 72 hours. Studies/Results: No results found. . (feeding supplement) PROSource Plus  30 mL Oral BID BM  . allopurinol  100 mg Oral Daily  . B-complex with vitamin C  1 tablet Oral Daily  . bisacodyl  5 mg Oral Daily  . cephALEXin  250 mg Oral Q8H  . Chlorhexidine Gluconate Cloth  6 each Topical Q0600  . ezetimibe  10 mg Oral QHS   And  . simvastatin  20 mg Oral QHS  . feeding supplement  237 mL Oral BID BM  . finasteride  5 mg Oral QHS  . folic acid  1,517 mcg Oral Daily  . insulin aspart  0-15 Units Subcutaneous TID WC  . insulin aspart  0-5 Units Subcutaneous QHS  . levothyroxine  75 mcg Oral QAC breakfast  . midodrine  10 mg Oral TID WC  . polyethylene glycol  17 g Oral Daily  . senna-docusate  1 tablet Oral BID  . sodium chloride flush  10-40 mL Intracatheter Q12H  . tamsulosin   0.4 mg Oral QPC supper  . vitamin B-12  1,000 mcg Oral QHS  . Warfarin - Pharmacist Dosing Inpatient   Does not apply q1600    BMET    Component Value Date/Time   NA 130 (L) 03/06/2020 0405   NA 138 07/01/2019 0900   NA 140 01/19/2017 0827   K 4.5 03/06/2020 0405   K 4.8 01/19/2017 0827   CL 96 (L) 03/06/2020 0405   CO2 25 03/06/2020 0405   CO2 24 01/19/2017 0827   GLUCOSE 123 (H) 03/06/2020 0405   GLUCOSE 103 01/19/2017 0827   BUN 24 (H) 03/06/2020 0405   BUN 87 (HH) 07/01/2019 0900   BUN 28.4 (H) 01/19/2017 0827   CREATININE 1.00 03/06/2020 0405   CREATININE 3.05 (HH) 11/12/2019 0820   CREATININE 1.2 01/19/2017 0827   CALCIUM 8.5 (L) 03/06/2020 0405   CALCIUM 9.7 01/19/2017 0827   GFRNONAA >60 03/06/2020 0405   GFRNONAA 18 (L) 11/12/2019 0820   GFRAA 20 (L) 12/02/2019 0927   GFRAA 21 (L) 11/12/2019 0820   CBC    Component Value Date/Time   WBC 7.0 03/06/2020 0401   RBC 2.42 (L) 03/06/2020 0401   HGB 7.6 (L) 03/06/2020 0401   HGB 9.3 (L) 08/01/2017 1136   HGB 11.6 (L) 03/30/2017 1147   HGB 12.0 (L) 01/19/2017 0827   HCT 26.0 (L) 03/06/2020 0401  HCT 31.1 (L) 08/20/2019 1105   HCT 37.8 (L) 01/19/2017 0827   PLT 89 (L) 03/06/2020 0401   PLT 56 (L) 08/01/2017 1136   PLT 59 (LL) 03/30/2017 1147   MCV 107.4 (H) 03/06/2020 0401   MCV 106 (H) 03/30/2017 1147   MCV 113.9 (H) 01/19/2017 0827   MCH 31.4 03/06/2020 0401   MCHC 29.2 (L) 03/06/2020 0401   RDW 18.5 (H) 03/06/2020 0401   RDW 14.5 03/30/2017 1147   RDW 16.2 (H) 01/19/2017 0827   LYMPHSABS 0.5 (L) 03/02/2020 1510   LYMPHSABS 0.8 (L) 01/19/2017 0827   MONOABS 0.4 03/02/2020 1510   MONOABS 0.3 01/19/2017 0827   EOSABS 0.0 03/02/2020 1510   EOSABS 0.1 01/19/2017 0827   BASOSABS 0.0 03/02/2020 1510   BASOSABS 0.0 01/19/2017 0827   Assessment/Plan:  1. CKDIV: BL Cr 2.5-3.4, Cr on admission 3.29. Patient has a history of nephrosclerosis and cardiorenal syndrome and is followed by Dr. Johnney Ou at clinic.  Patient arrived significantly volume overloaded, about 45 lbs up from dry weight of 218.Failed aggressive diuresis at home. 1. Started on CRRT 03/03/20 and required initiation of levophed. 2. Using all 4K/2.5Ca bath: Pre-filter 500 ml/hr, post-filter 300 ml/hr, dialysate 1500 ml/hr 3. Goal uf 100-200 ml/hr 4. Temporary HD catheter placement12/1/21 by IR 5. Patient is still requiring Levophed to maintain his blood pressure with CRRT, he likely would not be able to tolerate IHD. Would not be a candidate for long term dialysis.   6. Will likelyneed tunneled HD cath prior to discharge if he can tolerate eventual IHD 7. At present it is unlikely that he would be a candidate for IHD given the need for pressor support with CRRT as well as his advanced age, multiple co-morbidities, and poor functional/nutritional status.Does follow with palliative care at home.  8. Palliative care has seen patient and will meet with family to help transition to hospice care. 9. Will continue with CRRT for now and wan levophed as able to facilitate transition to home with hospice.  2. Hypokalemia:improved with 4K bath and CRRT 3. Hypophosphatemia- given replacement Kphos and follow. 4. NYHA class IV CHF with volume overload and hypotension- Now on levophed, attempting to wean however continues to have low blood pressures.  5. UTI- on cefepime per PCCM 6. Renal osteodystrophy: Phos 5.5, PTH 58 7. Anemia -Patient has history of ITP. Receive feraheme. Hgb today 7.6.  8. CHF -Advanced heart failure following. 9. Mechanical AVR on coumadin goal INR 2.5-3.5 10. 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 COPD 37. DM 14. ITP  Donetta Potts, MD [employer] [phone]

## 2020-03-06 NOTE — Progress Notes (Signed)
ANTICOAGULATION CONSULT NOTE   Pharmacy Consult for Warfarin Indication: Mechanical AVR  Allergies  Allergen Reactions  . Rifampin Rash and Other (See Comments)    May have been caused by Vancomycin or Rifampin (??)  . Vancomycin Rash and Other (See Comments)    May have been caused by Vancomycin or Rifampin (??)    Patient Measurements: Height: 6\' 2"  (188 cm) Weight: 117 kg (257 lb 15 oz) IBW/kg (Calculated) : 82.2 Heparin Dosing Weight:   Vital Signs: Temp: 97.4 F (36.3 C) (12/04 0815) Temp Source: Oral (12/04 0815) BP: 105/61 (12/04 0915) Pulse Rate: 65 (12/04 0915)  Labs: Recent Labs    03/04/20 0459 03/04/20 0500 03/05/20 0419 03/05/20 1728 03/06/20 0401 03/06/20 0405  HGB 7.6*   < > 7.7*  --  7.6*  --   HCT 24.8*  --  26.1*  --  26.0*  --   PLT 109*  --  101*  --  89*  --   LABPROT 31.1*  --  36.1*  --  34.9*  --   INR 3.1*  --  3.8*  --  3.6*  --   CREATININE  --    < > 1.35* 1.12  --  1.00   < > = values in this interval not displayed.    Estimated Creatinine Clearance: 77.4 mL/min (by C-G formula based on SCr of 1 mg/dL).  Assessment: 18 yom that is being admitted for SOB after fluid overload in the setting of CKD. Patient is on warfarin at home for a mechanical AVR. INR today slightly supratherapeutic at 3.6  PTA Regimen: Warfarin 5mg  Sat and Tues and 2.5mg  ROW  Goal of Therapy:  INR 2.5-3.5 Monitor platelets by anticoagulation protocol: Yes   Plan:  -Warfarin 2.5 mg x 1 tonight -Daily INR, CBC  Barth Kirks, PharmD, BCPS, BCCCP Clinical Pharmacist [phone]  Please check AMION for all [facility] numbers  03/06/2020 9:31 AM

## 2020-03-06 NOTE — Progress Notes (Signed)
NAME:  [name], MRN:  [id number], DOB:  December 10, 1935, LOS: 4 ADMISSION DATE:  03/02/2020, CONSULTATION DATE:  03/03/2020 REFERRING MD:  Dr. Kristopher Oppenheim, CHIEF COMPLAINT:  Cardiorenal syndrome   History of present illness   84 year old male presents to ED on 11/30 with progressive shortness of breath and lower extremity edema with CHF and Severe MR with progressive renal failure. Has been followed by Heart Failure and Evaluated by CTS. Determined not a candidate for MitraClip. High risk for conventional surgery. Recommended aggressive medical management.   On arrival to ED patient is massively volume overloaded with poor response to outpatient diuretics. Crt 3.3. BUN 116. INR 2.8. Nephrology and Heart Failure Consulted. Attempted High dose diuretics overnight. Complicated also by hypotension. 12/1 decision made to transfer to ICU for CRRT.   Goals of care conversation with patient. States he is a DNR/DNI however wants to try dialysis, if it does not work would be amendable to comfort.   Past Medical History  Chronic Combined CHF, Severe Mitral Regurgitation, H/O mechanical aortic valve replacement, A.Fib, CAD s/p CABG, CKD stage IV (baseline Crt 2.5-3), ITP anemia  Significant Hospital Events   11/30 > Admit to Hospital  12/1 > Transfer to ICU for CRRT   Consults:  Heart Failure Nephrology  PCCM  Procedures:  Right IJ Vascath 12/1 >>  Significant Diagnostic Tests:  CXR 12/1 > Cardiac shadow is enlarged but stable. Postsurgical changes are again seen and stable. Aortic calcifications are noted. Mild vascular congestion is noted with mild interstitial edema. No focal infiltrate is seen. No bony abnormality is noted.  Micro Data:  Urine Culture 12/1 >>   Antimicrobials:  Cefepime x 1 dose 12/1   Interim history/subjective:   Patient remains critically ill in the intensive care unit on multiple vasopressors and CVVHD. Still pulling negative with CVVHD and tolerating. We pursue  palliative care input heart failure service input after discussions yesterday.  Objective   Blood pressure (!) 89/68, pulse 69, temperature 97.7 F (36.5 C), temperature source Oral, resp. rate 12, height 6\' 2"  (1.88 m), weight 117 kg, SpO2 98 %.        Intake/Output Summary (Last 24 hours) at 03/06/2020 0755 Last data filed at 03/06/2020 0700 Gross per 24 hour  Intake 2544.14 ml  Output 5788 ml  Net -3243.86 ml   Filed Weights   03/04/20 0500 03/05/20 0500 03/06/20 0415  Weight: 122.8 kg 120.4 kg 117 kg    Examination: General:Elderly male, resting in bed CVVHD with central venous access HEENT: Right HD IJ catheter. Neuro: Alert oriented following commands CV: Mechanical heart tones, irregularly irregular PULM:Diminished breath sounds bilaterally poor effort poor intake, no crackles GI: Soft, nontender nondistended Extremities: 2+ lower extremity edema Unna boots in place Skin: Scattered bruising  Resolved Hospital Problem list     Assessment & Plan:   Acute on Chronic Heart Failure with severe volume overload and RV Failure with hypotension  Severe MR H/O AVR CAD s/p CABG Cardiogenic shock -Not a candidate for Mitral Clip, not a surgical candidate per CTS. Evaluated at [facility] for percutaneous mitral valve trial however denied  Plan Patient has end-stage acute on chronic decompensated heart failure right ventricular failure, severe MR, no surgical candidate Continue vasopressor support with vasopressin plus norepinephrine. Added vasopressin this morning help hopefully offset the high-dose norepinephrine. Continue pull with CVVHD for volume removal. Multidisciplinary discussions underway with nephrology, palliative care and advanced heart failure service. I think the best option would be to try  to remove as much volume as we could from the patient over the next 24 to 48 hours and consider transition to home hospice if we can get him off of vasopressor support.  We were  unable to do this then we would need to consider transition to comfort care in the hospital setting which we would expect a hospital death.  Stage IV CKD (baseline Crt 2.5-3.4)  Cardiorenal Syndrome  Plan  Continue CVVHD  UTI  Plan Descalated to cefazolin  A.Fib on Coumadin Plan Continue Coumadin Holding beta-blockade  COPD OSA on CPAP at HS  Plan Titrate FiO2 to maintain SPO2 greater than 90%  DM  Plan SSI  ITP Plan Follow CBC  Best practice (evaluated daily)   Diet: Low Salt Diet  DVT prophylaxis: Coumadin as above GI prophylaxis: N/A Glucose control: SSI Mobility: Bedrest  last date of multidisciplinary goals of care discussion: 03/05/2020 goals of care discussion Family and staff present: Patient palliative care team Summary of discussion: DNR Follow up goals of care discussion due Code Status: DNR Disposition: ICU  Labs    CBC: Recent Labs  Lab 03/02/20 1510 03/03/20 0409 03/04/20 0459 03/05/20 0419 03/06/20 0401  WBC 4.4 3.2* 5.4 6.4 7.0  NEUTROABS 3.5  --   --   --   --   HGB 8.3* 8.5* 7.6* 7.7* 7.6*  HCT 26.2* 27.8* 24.8* 26.1* 26.0*  MCV 103.1* 103.0* 102.5* 106.1* 107.4*  PLT 78* 65* 109* 101* 89*    Basic Metabolic Panel: Recent Labs  Lab 03/03/20 2136 03/04/20 0459 03/04/20 0500 03/04/20 1600 03/05/20 0419 03/05/20 1728 03/06/20 0401 03/06/20 0405  NA   < >  --  130* 132* 132* 131*  --  130*  K   < >  --  3.2* 4.1 4.8 4.5  --  4.5  CL   < >  --  91* 94* 97* 97*  --  96*  CO2   < >  --  25 25 26 25   --  25  GLUCOSE   < >  --  92 133* 123* 110*  --  123*  BUN   < >  --  70* 47* 34* 26*  --  24*  CREATININE   < >  --  1.93* 1.63* 1.35* 1.12  --  1.00  CALCIUM   < >  --  8.9 8.8* 8.6* 8.3*  --  8.5*  MG  --  2.5*  --   --  2.5*  --  2.3  --   PHOS  --   --  3.6 2.9 2.4* 2.0*  --  2.5   < > = values in this interval not displayed.   GFR: Estimated Creatinine Clearance: 77.4 mL/min (by C-G formula based on SCr of 1 mg/dL).  Recent Labs  Lab 03/03/20 0409 03/04/20 0459 03/05/20 0419 03/06/20 0401  WBC 3.2* 5.4 6.4 7.0    Liver Function Tests: Recent Labs  Lab 03/02/20 1510 03/02/20 1510 03/03/20 0409 03/03/20 1533 03/04/20 0500 03/04/20 1600 03/05/20 0419 03/05/20 1728 03/06/20 0405  AST 28  --  24  --   --   --   --   --   --   ALT 20  --  18  --   --   --   --   --   --   ALKPHOS 136*  --  118  --   --   --   --   --   --  BILITOT 1.1  --  1.3*  --   --   --   --   --   --   PROT 6.4*  --  6.0*  --   --   --   --   --   --   ALBUMIN 3.2*   < > 3.1*   < > 3.0* 3.3* 3.0* 3.1* 3.0*   < > = values in this interval not displayed.   No results for input(s): LIPASE, AMYLASE in the last 168 hours. No results for input(s): AMMONIA in the last 168 hours.  ABG    Component Value Date/Time   PHART 7.321 (L) 12/30/2008 1303   PCO2ART 48.5 (H) 12/30/2008 1303   PO2ART 296.0 (H) 12/30/2008 1303   HCO3 27.0 04/30/2018 1212   TCO2 28 04/30/2018 1212   ACIDBASEDEF 1.0 12/30/2008 1303   O2SAT 76.0 04/30/2018 1212     Coagulation Profile: Recent Labs  Lab 03/02/20 1510 03/03/20 0409 03/04/20 0459 03/05/20 0419 03/06/20 0401  INR 2.7* 2.8* 3.1* 3.8* 3.6*    Cardiac Enzymes: No results for input(s): CKTOTAL, CKMB, CKMBINDEX, TROPONINI in the last 168 hours.  HbA1C: Hgb A1c MFr Bld  Date/Time Value Ref Range Status  03/25/2019 09:53 AM 5.6 4.6 - 6.5 % Final    Comment:    Glycemic Control Guidelines for People with Diabetes:Non Diabetic:  <6%Goal of Therapy: <7%Additional Action Suggested:  >8%   07/01/2018 09:28 AM 5.7 4.6 - 6.5 % Final    Comment:    Glycemic Control Guidelines for People with Diabetes:Non Diabetic:  <6%Goal of Therapy: <7%Additional Action Suggested:  >8%     CBG: Recent Labs  Lab 03/04/20 2116 03/05/20 0738 03/05/20 1122 03/05/20 1533 03/05/20 2205  GLUCAP 154* 148* 179* 144* 147*    This patient is critically ill with multiple organ system failure; which,  requires frequent high complexity decision making, assessment, support, evaluation, and titration of therapies. This was completed through the application of advanced monitoring technologies and extensive interpretation of multiple databases. During this encounter critical care time was devoted to patient care services described in this note for 32 minutes.  Garner Nash, DO [facility] 03/06/2020 7:55 AM

## 2020-03-06 NOTE — Progress Notes (Signed)
Pt refused CPAP for tonight. RN will put pt on CPAP if pt decides to wear it later tonight.

## 2020-03-06 NOTE — Plan of Care (Signed)
  Problem: Nutrition Goal: Patient maintains adequate hydration Outcome: Progressing   Problem: Nutrition: Goal: Adequate nutrition will be maintained Outcome: Progressing Note: Pt is eating 75% or greater of each meal   Problem: Pain Managment: Goal: General experience of comfort will improve Outcome: Progressing Note: Pt does not complain of any pain.    Problem: Activity: Goal: Risk for activity intolerance will decrease Outcome: Not Progressing Note: Pt unable to mobilize at this time due to CRRT, fluid overload, and multiple vasopressors   Problem: Elimination: Goal: Will not experience complications related to bowel motility Outcome: Not Progressing Note: Pt has not had BM since 11/29. Multiple bowel regimens are in place. Will give PRN suppository today.

## 2020-03-06 NOTE — Progress Notes (Signed)
Daily Progress Note   Patient Name: [name]       Date: 03/06/2020 DOB: September 03, 1935  Age: 84 y.o. MRN#: [id number] Attending Physician: Garner Nash, DO Primary Care Physician: Cassandria Anger, MD Admit Date: 03/02/2020  Reason for Consultation/Follow-up: To discuss complex medical decision making related to patient's goals of care  Subjective: Met with patient and his wife [name] at bedside.  They have a good understanding that our hope is for him to be able to wean off of norepinephrine and CRRT and go home with hospice services.  [name] is familiar with hospice services.  We discussed a bath aide, nurse, social worker, and chaplain that would be available to come to the house to assist.  [name] also understands she will need additional privately hired help.  She has been caring for the patient for a long time on her own.  She tells me they have been together over 47 years.  Both patient and his wife are content with the plan.  I explained that if we were unable to wean him from norepinephrine he would stay in the hospital.  Assessment: 84 year old very fragile gentleman currently on CRRT and norepinephrine.  Severe mitral regurg, decompensated heart failure/cardiorenal syndrome.  Not able to tolerate intermittent hemodialysis.   Patient Profile/HPI: 84 year old male with history of stage IV chronic kidney disease, aortic valve replacement on Coumadin, COPD, coronary artery disease status post CABG and left CEA, diabetes, ITP.  Admitted to the hospital and found to be in volume overload with a weight gain of over 45 pounds.  Found to have severe mitral regurg.  Currently on CRRT which was started December 1 for gentle volume removal.  Patient is unable to tolerate intermittent  hemodialysis.  He is requiring pressors for even gentle renal replacement therapy.   Length of Stay: 4   Vital Signs: BP (!) 87/45   Pulse 60   Temp (!) 97.3 F (36.3 C) (Oral)   Resp 17   Ht 6' 2"  (1.88 m)   Wt 117 kg   SpO2 100%   BMI 33.12 kg/m  SpO2: SpO2: 100 % O2 Device: O2 Device: Nasal Cannula O2 Flow Rate: O2 Flow Rate (L/min): 2 L/min       Palliative Assessment/Data: 20-30%     Palliative Care Plan  Recommendations/Plan:  Continue current care  Hopefully when CRRT is discontinued patient will be able to wean from pressors.  Hopeful for home with hospice.  If he is unable to wean from pressors he will likely have hospital death.  Code Status:  DNR  Prognosis:   < 2 weeks   Discharge Planning:  Home with Hospice versus hospital death.  Care plan was discussed with ICU RN, Dr. Valeta Harms, and patient and wife  Thank you for allowing the Palliative Medicine Team to assist in the care of this patient.  Total time spent: 25 minutes     Greater than 50%  of this time was spent counseling and coordinating care related to the above assessment and plan.  Florentina Jenny, PA-C Palliative Medicine  Please contact Palliative MedicineTeam phone at [phone] for questions and concerns between 7 am - 7 pm.   Please see AMION for individual provider pager numbers.

## 2020-03-06 NOTE — Progress Notes (Signed)
PCCM Goals of Care Discussion and Advanced Care Planning:   Date: 03/06/2020   Present Parties: patient and wife [name]   What was discussed:   We discussed the patients wishes as well as the wife's. Planning to continue current scope of care, pressors and cvvhd. We will attempt to remove fluid and hopefully get him to a state where he could be transferred/ transitioned home with hospice care. This is the desired goal.   I explained the biggest barrier at the moment is coming off the vasopressors. They understand this.   Outcome: wean from pressors, DNR status. Once able to come off cvvhd we will plan for transition home with hospice care   I also had a separate discussion of plans with nephrology as well as palliative care.   18 mins of time was spent discussing the goals of care, advanced care planning options such as code status as well as do not resuscitate forms. [phone])

## 2020-03-07 DIAGNOSIS — R570 Cardiogenic shock: Secondary | ICD-10-CM | POA: Diagnosis not present

## 2020-03-07 DIAGNOSIS — N189 Chronic kidney disease, unspecified: Secondary | ICD-10-CM | POA: Diagnosis not present

## 2020-03-07 DIAGNOSIS — N179 Acute kidney failure, unspecified: Secondary | ICD-10-CM | POA: Diagnosis not present

## 2020-03-07 DIAGNOSIS — R601 Generalized edema: Secondary | ICD-10-CM | POA: Diagnosis not present

## 2020-03-07 LAB — CBC
HCT: 25.7 % — ABNORMAL LOW (ref 39.0–52.0)
Hemoglobin: 7.4 g/dL — ABNORMAL LOW (ref 13.0–17.0)
MCH: 31.4 pg (ref 26.0–34.0)
MCHC: 28.8 g/dL — ABNORMAL LOW (ref 30.0–36.0)
MCV: 108.9 fL — ABNORMAL HIGH (ref 80.0–100.0)
Platelets: 65 10*3/uL — ABNORMAL LOW (ref 150–400)
RBC: 2.36 MIL/uL — ABNORMAL LOW (ref 4.22–5.81)
RDW: 19 % — ABNORMAL HIGH (ref 11.5–15.5)
WBC: 5.7 10*3/uL (ref 4.0–10.5)
nRBC: 0 % (ref 0.0–0.2)

## 2020-03-07 LAB — RENAL FUNCTION PANEL
Albumin: 2.9 g/dL — ABNORMAL LOW (ref 3.5–5.0)
Albumin: 3.1 g/dL — ABNORMAL LOW (ref 3.5–5.0)
Anion gap: 6 (ref 5–15)
Anion gap: 9 (ref 5–15)
BUN: 17 mg/dL (ref 8–23)
BUN: 15 mg/dL (ref 8–23)
CO2: 27 mmol/L (ref 22–32)
CO2: 26 mmol/L (ref 22–32)
Calcium: 8.3 mg/dL — ABNORMAL LOW (ref 8.9–10.3)
Calcium: 8.3 mg/dL — ABNORMAL LOW (ref 8.9–10.3)
Chloride: 99 mmol/L (ref 98–111)
Chloride: 99 mmol/L (ref 98–111)
Creatinine, Ser: 0.92 mg/dL (ref 0.61–1.24)
Creatinine, Ser: 0.92 mg/dL (ref 0.61–1.24)
GFR, Estimated: >60 mL/min (ref >60)
GFR, Estimated: >60 mL/min (ref >60)
Glucose, Bld: 136 mg/dL — ABNORMAL HIGH (ref 70–99)
Glucose, Bld: 132 mg/dL — ABNORMAL HIGH (ref 70–99)
Phosphorus: 2.1 mg/dL — ABNORMAL LOW (ref 2.5–4.6)
Phosphorus: 1.9 mg/dL — ABNORMAL LOW (ref 2.5–4.6)
Potassium: 4.4 mmol/L (ref 3.5–5.1)
Potassium: 4.5 mmol/L (ref 3.5–5.1)
Sodium: 132 mmol/L — ABNORMAL LOW (ref 135–145)
Sodium: 134 mmol/L — ABNORMAL LOW (ref 135–145)

## 2020-03-07 LAB — GLUCOSE, CAPILLARY
Glucose-Capillary: 158 mg/dL — ABNORMAL HIGH (ref 70–99)
Glucose-Capillary: 176 mg/dL — ABNORMAL HIGH (ref 70–99)
Glucose-Capillary: 111 mg/dL — ABNORMAL HIGH (ref 70–99)
Glucose-Capillary: 158 mg/dL — ABNORMAL HIGH (ref 70–99)

## 2020-03-07 LAB — PROTIME-INR
INR: 3.4 — ABNORMAL HIGH (ref 0.8–1.2)
Prothrombin Time: 32.9 seconds — ABNORMAL HIGH (ref 11.4–15.2)

## 2020-03-07 LAB — MAGNESIUM: Magnesium: 2.6 mg/dL — ABNORMAL HIGH (ref 1.7–2.4)

## 2020-03-07 MED ORDER — ORAL CARE MOUTH RINSE
15.0000 mL | Freq: Two times a day (BID) | OROMUCOSAL | Status: DC
  Administered 2020-03-08 – 2020-03-16 (×14): 15 mL via OROMUCOSAL

## 2020-03-07 MED ORDER — WARFARIN SODIUM 2.5 MG PO TABS
2.5000 mg | ORAL_TABLET | Freq: Once | ORAL | Status: AC
  Administered 2020-03-07: 2.5 mg via ORAL
  Filled 2020-03-07: qty 1

## 2020-03-07 NOTE — Progress Notes (Signed)
Patient ID: [name], male   DOB: 1935/08/27, 84 y.o.   MRN: [id number] S: Continues to UF and have taken off 12 liters since starting CRRT, however have had to increase pressor support.  No complaints.  O:BP (!) 90/50   Pulse 61   Temp (!) 97.2 F (36.2 C) (Axillary)   Resp 14   Ht 6\' 2"  (1.88 m)   Wt 113.7 kg   SpO2 97%   BMI 32.18 kg/m   Intake/Output Summary (Last 24 hours) at 03/07/2020 1048 Last data filed at 03/07/2020 1000 Gross per 24 hour  Intake 2509.65 ml  Output 6015 ml  Net -3505.35 ml   Intake/Output: I/O last 3 completed shifts: In: 4324.8 [P.O.:2618; I.V.:1145.8; Other:30; IV [id number]:976] Out: 9036 [Urine:659; Other:8377]  Intake/Output this shift:  Total I/O In: 629.1 [P.O.:530; I.V.:99.1] Out: 785 [Urine:40; Other:745] Weight change: -3.3 kg Gen: frail, chronically ill-appearing WM in NAD CVS: RRR, no rub Resp: decreased BS at bases Abd: +BS, soft, NT/Nd Ext: 2+ anasarca  Recent Labs  Lab 03/02/20 1510 03/02/20 1510 03/03/20 0409 03/03/20 0609 03/04/20 0500 03/04/20 1600 03/05/20 0419 03/05/20 1728 03/06/20 0405 03/06/20 1639 03/07/20 0431  NA 124*   < > 124*   < > 130* 132* 132* 131* 130* 134* 132*  K 3.9   < > 3.1*   < > 3.2* 4.1 4.8 4.5 4.5 4.5 4.4  CL 85*   < > 85*   < > 91* 94* 97* 97* 96* 99 99  CO2 24   < > 24   < > 25 25 26 25 25 27 27   GLUCOSE 118*   < > 112*   < > 92 133* 123* 110* 123* 127* 136*  BUN 116*   < > 114*   < > 70* 47* 34* 26* 24* 22 17  CREATININE 3.29*   < > 3.01*   < > 1.93* 1.63* 1.35* 1.12 1.00 1.07 0.92  ALBUMIN 3.2*   < > 3.1*   < > 3.0* 3.3* 3.0* 3.1* 3.0* 3.1* 2.9*  CALCIUM 8.6*   < > 8.6*   < > 8.9 8.8* 8.6* 8.3* 8.5* 8.5* 8.3*  PHOS  --   --  5.5*   < > 3.6 2.9 2.4* 2.0* 2.5 2.4* 2.1*  AST 28  --  24  --   --   --   --   --   --   --   --   ALT 20  --  18  --   --   --   --   --   --   --   --    < > = values in this interval not displayed.   Liver Function Tests: Recent Labs  Lab 03/02/20 1510  03/02/20 1510 03/03/20 0409 03/03/20 1533 03/06/20 0405 03/06/20 1639 03/07/20 0431  AST 28  --  24  --   --   --   --   ALT 20  --  18  --   --   --   --   ALKPHOS 136*  --  118  --   --   --   --   BILITOT 1.1  --  1.3*  --   --   --   --   PROT 6.4*  --  6.0*  --   --   --   --   ALBUMIN 3.2*   < > 3.1*   < > 3.0* 3.1* 2.9*   < > =  values in this interval not displayed.   No results for input(s): LIPASE, AMYLASE in the last 168 hours. No results for input(s): AMMONIA in the last 168 hours. CBC: Recent Labs  Lab 03/02/20 1510 03/02/20 1510 03/03/20 0409 03/03/20 0409 03/04/20 0459 03/04/20 0459 03/05/20 0419 03/06/20 0401 03/07/20 0431  WBC 4.4   < > 3.2*   < > 5.4   < > 6.4 7.0 5.7  NEUTROABS 3.5  --   --   --   --   --   --   --   --   HGB 8.3*   < > 8.5*   < > 7.6*   < > 7.7* 7.6* 7.4*  HCT 26.2*   < > 27.8*   < > 24.8*   < > 26.1* 26.0* 25.7*  MCV 103.1*   < > 103.0*  --  102.5*  --  106.1* 107.4* 108.9*  PLT 78*   < > 65*   < > 109*   < > 101* 89* 65*   < > = values in this interval not displayed.   Cardiac Enzymes: No results for input(s): CKTOTAL, CKMB, CKMBINDEX, TROPONINI in the last 168 hours. CBG: Recent Labs  Lab 03/06/20 0812 03/06/20 1152 03/06/20 1641 03/06/20 2224 03/07/20 0839  GLUCAP 133* 156* 116* 132* 158*    Iron Studies: No results for input(s): IRON, TIBC, TRANSFERRIN, FERRITIN in the last 72 hours. Studies/Results: No results found. . (feeding supplement) PROSource Plus  30 mL Oral BID BM  . allopurinol  100 mg Oral Daily  . B-complex with vitamin C  1 tablet Oral Daily  . cephALEXin  250 mg Oral Q8H  . Chlorhexidine Gluconate Cloth  6 each Topical Q0600  . ezetimibe  10 mg Oral QHS   And  . simvastatin  20 mg Oral QHS  . feeding supplement  237 mL Oral BID BM  . finasteride  5 mg Oral QHS  . folic acid  6,568 mcg Oral Daily  . insulin aspart  0-15 Units Subcutaneous TID WC  . insulin aspart  0-5 Units Subcutaneous QHS  .  levothyroxine  75 mcg Oral QAC breakfast  . midodrine  10 mg Oral TID WC  . polyethylene glycol  17 g Oral Daily  . senna-docusate  1 tablet Oral BID  . sodium chloride flush  10-40 mL Intracatheter Q12H  . tamsulosin  0.4 mg Oral QPC supper  . vitamin B-12  1,000 mcg Oral QHS  . warfarin  2.5 mg Oral ONCE-1600  . Warfarin - Pharmacist Dosing Inpatient   Does not apply q1600    BMET    Component Value Date/Time   NA 132 (L) 03/07/2020 0431   NA 138 07/01/2019 0900   NA 140 01/19/2017 0827   K 4.4 03/07/2020 0431   K 4.8 01/19/2017 0827   CL 99 03/07/2020 0431   CO2 27 03/07/2020 0431   CO2 24 01/19/2017 0827   GLUCOSE 136 (H) 03/07/2020 0431   GLUCOSE 103 01/19/2017 0827   BUN 17 03/07/2020 0431   BUN 87 (HH) 07/01/2019 0900   BUN 28.4 (H) 01/19/2017 0827   CREATININE 0.92 03/07/2020 0431   CREATININE 3.05 (HH) 11/12/2019 0820   CREATININE 1.2 01/19/2017 0827   CALCIUM 8.3 (L) 03/07/2020 0431   CALCIUM 9.7 01/19/2017 0827   GFRNONAA >60 03/07/2020 0431   GFRNONAA 18 (L) 11/12/2019 0820   GFRAA 20 (L) 12/02/2019 0927   GFRAA 21 (L) 11/12/2019 0820  CBC    Component Value Date/Time   WBC 5.7 03/07/2020 0431   RBC 2.36 (L) 03/07/2020 0431   HGB 7.4 (L) 03/07/2020 0431   HGB 9.3 (L) 08/01/2017 1136   HGB 11.6 (L) 03/30/2017 1147   HGB 12.0 (L) 01/19/2017 0827   HCT 25.7 (L) 03/07/2020 0431   HCT 31.1 (L) 08/20/2019 1105   HCT 37.8 (L) 01/19/2017 0827   PLT 65 (L) 03/07/2020 0431   PLT 56 (L) 08/01/2017 1136   PLT 59 (LL) 03/30/2017 1147   MCV 108.9 (H) 03/07/2020 0431   MCV 106 (H) 03/30/2017 1147   MCV 113.9 (H) 01/19/2017 0827   MCH 31.4 03/07/2020 0431   MCHC 28.8 (L) 03/07/2020 0431   RDW 19.0 (H) 03/07/2020 0431   RDW 14.5 03/30/2017 1147   RDW 16.2 (H) 01/19/2017 0827   LYMPHSABS 0.5 (L) 03/02/2020 1510   LYMPHSABS 0.8 (L) 01/19/2017 0827   MONOABS 0.4 03/02/2020 1510   MONOABS 0.3 01/19/2017 0827   EOSABS 0.0 03/02/2020 1510   EOSABS 0.1 01/19/2017  0827   BASOSABS 0.0 03/02/2020 1510   BASOSABS 0.0 01/19/2017 0827    Assessment/Plan:  1. CKDIV: BL Cr 2.5-3.4, Cr on admission 3.29. Patient has a history of nephrosclerosis and cardiorenal syndrome and is followed by Dr. Johnney Ou at clinic. Patient arrived significantly volume overloaded, about 45 lbs up from dry weight of 218.Failed aggressive diuresis at home. 1. Started on CRRT 03/03/20 and required initiation of levophed. 2. Using all 4K/2.5Ca bath: Pre-filter 500 ml/hr, post-filter 300 ml/hr, dialysate 1500 ml/hr 3. Goal uf 100-200 ml/hr 4. Temporary HD catheter placement12/1/21 by IR 5. Patient is still requiring Levophed to maintain his blood pressure with CRRT. 6. He is not a candidate for IHD given the need for pressor support with CRRT as well ashis advanced age, multiple co-morbidities, and poor functional/nutritional status. 7. Palliative care has seen patient and plans to transition off of CRRT and wean pressors to help transition to home hospice care. 8. Will continue with CRRT for now and wean levophed as able to facilitate transition to home with hospice.  2. Hypokalemia:improved with 4K bath and CRRT 3. Hypophosphatemia- given replacement Kphos and follow. 4. NYHA class IV CHF with volume overload and hypotension- [place]on levophed, attempting to wean however continues to have low blood pressures. 5. UTI- on cefepime per PCCM 6. Renal osteodystrophy: Phos 5.5, PTH58 7. Anemia -Patient has history of ITP. Receive feraheme. Hgb today 7.6. 8. CHF -Advanced heart failure following. 9. Mechanical AVR on coumadin goal INR 2.5-3.5 10. 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 COPD 50. DM 14. ITP Donetta Potts, MD [employer] [phone]

## 2020-03-07 NOTE — Progress Notes (Signed)
ANTICOAGULATION CONSULT NOTE   Pharmacy Consult for Warfarin Indication: Mechanical AVR  Allergies  Allergen Reactions  . Rifampin Rash and Other (See Comments)    May have been caused by Vancomycin or Rifampin (??)  . Vancomycin Rash and Other (See Comments)    May have been caused by Vancomycin or Rifampin (??)    Patient Measurements: Height: 6\' 2"  (188 cm) Weight: 113.7 kg (250 lb 10.6 oz) IBW/kg (Calculated) : 82.2 Heparin Dosing Weight:   Vital Signs: Temp: 97.5 F (36.4 C) (12/05 0400) Temp Source: Oral (12/05 0400) BP: 114/100 (12/05 0830) Pulse Rate: 70 (12/05 0830)  Labs: Recent Labs    03/05/20 0419 03/05/20 1728 03/06/20 0401 03/06/20 0405 03/06/20 1639 03/07/20 0431  HGB 7.7*  --  7.6*  --   --  7.4*  HCT 26.1*  --  26.0*  --   --  25.7*  PLT 101*  --  89*  --   --  65*  LABPROT 36.1*  --  34.9*  --   --  32.9*  INR 3.8*  --  3.6*  --   --  3.4*  CREATININE 1.35*   < >  --  1.00 1.07 0.92   < > = values in this interval not displayed.    Estimated Creatinine Clearance: 83 mL/min (by C-G formula based on SCr of 0.92 mg/dL).  Assessment: 39 yom that is being admitted for SOB after fluid overload in the setting of CKD. Patient is on warfarin at home for a mechanical AVR. INR today within goal at 3.4  PTA Regimen: Warfarin 5mg  Sat and Tues and 2.5mg  ROW  Goal of Therapy:  INR 2.5-3.5 Monitor platelets by anticoagulation protocol: Yes   Plan:  -Warfarin 2.5 mg x 1 tonight -Daily INR, CBC  Barth Kirks, PharmD, BCPS, BCCCP Clinical Pharmacist [phone]  Please check AMION for all [place] numbers  03/07/2020 9:00 AM

## 2020-03-07 NOTE — Plan of Care (Signed)
  Problem: Nutrition Goal: Patient maintains adequate hydration Outcome: Progressing   Problem: Clinical Measurements: Goal: Diagnostic test results will improve Outcome: Progressing Goal: Respiratory complications will improve Outcome: Progressing   Problem: Nutrition: Goal: Adequate nutrition will be maintained Outcome: Progressing Note: Pt is eating 85-100% of each meal throughout day with supplements inbetween   Problem: Elimination: Goal: Will not experience complications related to bowel motility Outcome: Progressing Note: Pt is passing gas with last bowel movement 03/07/2020 Goal: Will not experience complications related to urinary retention Outcome: Progressing   Problem: Skin Integrity: Goal: Risk for impaired skin integrity will decrease Outcome: Progressing   Problem: Activity: Goal: Activity intolerance will improve Outcome: Not Progressing Note: Unable to mobilize at this time due to fluid overload and CRRT   Problem: Activity: Goal: Risk for activity intolerance will decrease Outcome: Not Progressing

## 2020-03-07 NOTE — Progress Notes (Signed)
Per Dr. Valeta Harms, GOAL BP is a MAP 60-65.

## 2020-03-07 NOTE — Progress Notes (Signed)
NAME:  [name], MRN:  [id number], DOB:  1935-04-11, LOS: 5 ADMISSION DATE:  03/02/2020, CONSULTATION DATE:  03/03/2020 REFERRING MD:  Dr. Kristopher Oppenheim, CHIEF COMPLAINT:  Cardiorenal syndrome   History of present illness   84 year old male presents to ED on 11/30 with progressive shortness of breath and lower extremity edema with CHF and Severe MR with progressive renal failure. Has been followed by Heart Failure and Evaluated by CTS. Determined not a candidate for MitraClip. High risk for conventional surgery. Recommended aggressive medical management.   On arrival to ED patient is massively volume overloaded with poor response to outpatient diuretics. Crt 3.3. BUN 116. INR 2.8. Nephrology and Heart Failure Consulted. Attempted High dose diuretics overnight. Complicated also by hypotension. 12/1 decision made to transfer to ICU for CRRT.   Goals of care conversation with patient. States he is a DNR/DNI however wants to try dialysis, if it does not work would be amendable to comfort.   Past Medical History  Chronic Combined CHF, Severe Mitral Regurgitation, H/O mechanical aortic valve replacement, A.Fib, CAD s/p CABG, CKD stage IV (baseline Crt 2.5-3), ITP anemia  Significant Hospital Events   11/30 > Admit to Hospital  12/1 > Transfer to ICU for CRRT   Consults:  Heart Failure Nephrology  PCCM  Procedures:  Right IJ Vascath 12/1 >>  Significant Diagnostic Tests:  CXR 12/1 > Cardiac shadow is enlarged but stable. Postsurgical changes are again seen and stable. Aortic calcifications are noted. Mild vascular congestion is noted with mild interstitial edema. No focal infiltrate is seen. No bony abnormality is noted.  Micro Data:  Urine Culture 12/1 >>   Antimicrobials:  Cefepime x 1 dose 12/1   Interim history/subjective:   Patient down 15 pounds from admission weight.  Up on 22 mics of Levophed and CVVHD.  Alert oriented.  Remains critically ill  Objective   Blood pressure  (!) 92/55, pulse 61, temperature (!) 97.5 F (36.4 C), temperature source Oral, resp. rate 13, height 6\' 2"  (1.88 m), weight 113.7 kg, SpO2 100 %.        Intake/Output Summary (Last 24 hours) at 03/07/2020 0727 Last data filed at 03/07/2020 0700 Gross per 24 hour  Intake 2855.1 ml  Output 5919 ml  Net -3063.9 ml   Filed Weights   03/05/20 0500 03/06/20 0415 03/07/20 0500  Weight: 120.4 kg 117 kg 113.7 kg    Examination: General:Elderly gentleman on CVVHD, vasopressors critically ill ICU HEENT: Right IJ HD catheter Neuro: Alert following commands CV: Mechanical heart tones, irregularly irregular PULM:Diminished breath sounds bilaterally no crackles no wheeze GI: Soft, nontender nondistended Extremities: Unna boots in place Skin: Scattered bruising  Resolved Hospital Problem list     Assessment & Plan:   Acute on Chronic Heart Failure with severe volume overload and RV Failure with hypotension  Severe MR H/O AVR CAD s/p CABG Cardiogenic shock -Not a candidate for Mitral Clip, not a surgical candidate per CTS. Evaluated at [facility] for percutaneous mitral valve trial however denied  Plan Patient with end-stage acute on chronic decompensated heart failure, right ventricular failure, severe MR, not a surgical candidate Continue to wean vasopressors and norepinephrine to maintain mean arterial pressure. At this time the goal is to diurese and remove as much volume as we can with CVVHD and attempt to wean off vasopressors for plans to transition to home hospice within the next 24 to 48 hours. At this time remains critically ill in the intensive care unit.  Stage  IV CKD (baseline Crt 2.5-3.4)  Cardiorenal Syndrome  Plan  CVVHD  UTI  Plan Cefazolin x7 days  A.Fib on Coumadin Plan Continue Coumadin Holding beta-blockade on vasopressors  COPD OSA on CPAP at HS  Plan Wean FiO2 to maintain SPO2 greater than 90%  DM  Plan SSI  ITP Plan Follow CBC  Best practice  (evaluated daily)   Diet: Low Salt Diet  DVT prophylaxis: Coumadin as above GI prophylaxis: N/A Glucose control: SSI Mobility: Bedrest  last date of multidisciplinary goals of care discussion: Star 03/06/2020 documented  Family and staff present: Patient palliative care team Summary of discussion: DNR Follow up goals of care discussion due Code Status: DNR Disposition: ICU  Labs    CBC: Recent Labs  Lab 03/02/20 1510 03/02/20 1510 03/03/20 0409 03/04/20 0459 03/05/20 0419 03/06/20 0401 03/07/20 0431  WBC 4.4   < > 3.2* 5.4 6.4 7.0 5.7  NEUTROABS 3.5  --   --   --   --   --   --   HGB 8.3*   < > 8.5* 7.6* 7.7* 7.6* 7.4*  HCT 26.2*   < > 27.8* 24.8* 26.1* 26.0* 25.7*  MCV 103.1*   < > 103.0* 102.5* 106.1* 107.4* 108.9*  PLT 78*   < > 65* 109* 101* 89* 65*   < > = values in this interval not displayed.    Basic Metabolic Panel: Recent Labs  Lab 03/04/20 0459 03/04/20 0500 03/05/20 0419 03/05/20 1728 03/06/20 0401 03/06/20 0405 03/06/20 1639 03/07/20 0431  NA  --    < > 132* 131*  --  130* 134* 132*  K  --    < > 4.8 4.5  --  4.5 4.5 4.4  CL  --    < > 97* 97*  --  96* 99 99  CO2  --    < > 26 25  --  25 27 27   GLUCOSE  --    < > 123* 110*  --  123* 127* 136*  BUN  --    < > 34* 26*  --  24* 22 17  CREATININE  --    < > 1.35* 1.12  --  1.00 1.07 0.92  CALCIUM  --    < > 8.6* 8.3*  --  8.5* 8.5* 8.3*  MG 2.5*  --  2.5*  --  2.3  --   --  2.6*  PHOS  --    < > 2.4* 2.0*  --  2.5 2.4* 2.1*   < > = values in this interval not displayed.   GFR: Estimated Creatinine Clearance: 83 mL/min (by C-G formula based on SCr of 0.92 mg/dL). Recent Labs  Lab 03/04/20 0459 03/05/20 0419 03/06/20 0401 03/07/20 0431  WBC 5.4 6.4 7.0 5.7    Liver Function Tests: Recent Labs  Lab 03/02/20 1510 03/02/20 1510 03/03/20 0409 03/03/20 1533 03/05/20 0419 03/05/20 1728 03/06/20 0405 03/06/20 1639 03/07/20 0431  AST 28  --  24  --   --   --   --   --   --   ALT 20  --   18  --   --   --   --   --   --   ALKPHOS 136*  --  118  --   --   --   --   --   --   BILITOT 1.1  --  1.3*  --   --   --   --   --   --  PROT 6.4*  --  6.0*  --   --   --   --   --   --   ALBUMIN 3.2*   < > 3.1*   < > 3.0* 3.1* 3.0* 3.1* 2.9*   < > = values in this interval not displayed.   No results for input(s): LIPASE, AMYLASE in the last 168 hours. No results for input(s): AMMONIA in the last 168 hours.  ABG    Component Value Date/Time   PHART 7.321 (L) 12/30/2008 1303   PCO2ART 48.5 (H) 12/30/2008 1303   PO2ART 296.0 (H) 12/30/2008 1303   HCO3 27.0 04/30/2018 1212   TCO2 28 04/30/2018 1212   ACIDBASEDEF 1.0 12/30/2008 1303   O2SAT 76.0 04/30/2018 1212     Coagulation Profile: Recent Labs  Lab 03/03/20 0409 03/04/20 0459 03/05/20 0419 03/06/20 0401 03/07/20 0431  INR 2.8* 3.1* 3.8* 3.6* 3.4*    Cardiac Enzymes: No results for input(s): CKTOTAL, CKMB, CKMBINDEX, TROPONINI in the last 168 hours.  HbA1C: Hgb A1c MFr Bld  Date/Time Value Ref Range Status  03/25/2019 09:53 AM 5.6 4.6 - 6.5 % Final    Comment:    Glycemic Control Guidelines for People with Diabetes:Non Diabetic:  <6%Goal of Therapy: <7%Additional Action Suggested:  >8%   07/01/2018 09:28 AM 5.7 4.6 - 6.5 % Final    Comment:    Glycemic Control Guidelines for People with Diabetes:Non Diabetic:  <6%Goal of Therapy: <7%Additional Action Suggested:  >8%     CBG: Recent Labs  Lab 03/05/20 2205 03/06/20 0812 03/06/20 1152 03/06/20 1641 03/06/20 2224  GLUCAP 147* 133* 156* 116* 132*    This patient is critically ill with multiple organ system failure; which, requires frequent high complexity decision making, assessment, support, evaluation, and titration of therapies. This was completed through the application of advanced monitoring technologies and extensive interpretation of multiple databases. During this encounter critical care time was devoted to patient care services described in this note  for 31 minutes.  Garner Nash, DO [facility] 03/07/2020 7:27 AM

## 2020-03-07 NOTE — Procedures (Signed)
Admit: 03/02/2020 LOS: 5  Requiring more pressors overnight.  Current CRRT Prescription: Start Date: 03/03/20 Catheter: RIJ temporary HD catheter placed 03/03/20 BFR: 150-400 ml/hr Pre Blood Pump: 500 ml/hr DFR: 1500 ml/hr Replacement Rate: 300 ml/hr Goal UF: 100-200 ml/hr Anticoagulation: none Clotting:  none    S:  No complaints.  O: 12/04 0701 - 12/05 0700 In: 2855.1 [P.O.:2048; I.V.:797.1] Out: 5919 [Urine:453]  Filed Weights   03/05/20 0500 03/06/20 0415 03/07/20 0500  Weight: 120.4 kg 117 kg 113.7 kg    Recent Labs  Lab 03/06/20 0405 03/06/20 1639 03/07/20 0431  NA 130* 134* 132*  K 4.5 4.5 4.4  CL 96* 99 99  CO2 25 27 27   GLUCOSE 123* 127* 136*  BUN 24* 22 17  CREATININE 1.00 1.07 0.92  CALCIUM 8.5* 8.5* 8.3*  PHOS 2.5 2.4* 2.1*   Recent Labs  Lab 03/02/20 1510 03/03/20 0409 03/05/20 0419 03/06/20 0401 03/07/20 0431  WBC 4.4   < > 6.4 7.0 5.7  NEUTROABS 3.5  --   --   --   --   HGB 8.3*   < > 7.7* 7.6* 7.4*  HCT 26.2*   < > 26.1* 26.0* 25.7*  MCV 103.1*   < > 106.1* 107.4* 108.9*  PLT 78*   < > 101* 89* 65*   < > = values in this interval not displayed.    Scheduled Meds: . (feeding supplement) PROSource Plus  30 mL Oral BID BM  . allopurinol  100 mg Oral Daily  . B-complex with vitamin C  1 tablet Oral Daily  . cephALEXin  250 mg Oral Q8H  . Chlorhexidine Gluconate Cloth  6 each Topical Q0600  . ezetimibe  10 mg Oral QHS   And  . simvastatin  20 mg Oral QHS  . feeding supplement  237 mL Oral BID BM  . finasteride  5 mg Oral QHS  . folic acid  1,324 mcg Oral Daily  . insulin aspart  0-15 Units Subcutaneous TID WC  . insulin aspart  0-5 Units Subcutaneous QHS  . levothyroxine  75 mcg Oral QAC breakfast  . midodrine  10 mg Oral TID WC  . polyethylene glycol  17 g Oral Daily  . senna-docusate  1 tablet Oral BID  . sodium chloride flush  10-40 mL Intracatheter Q12H  . tamsulosin  0.4 mg Oral QPC supper  . vitamin B-12  1,000 mcg Oral  QHS  . warfarin  2.5 mg Oral ONCE-1600  . Warfarin - Pharmacist Dosing Inpatient   Does not apply q1600   Continuous Infusions: .  prismasol BGK 4/2.5 500 mL/hr at 03/07/20 0205  .  prismasol BGK 4/2.5 300 mL/hr at 03/07/20 0551  . sodium chloride Stopped (03/06/20 0206)  . furosemide    . norepinephrine (LEVOPHED) Adult infusion 24 mcg/min (03/07/20 1000)  . prismasol BGK 4/2.5 1,500 mL/hr at 03/07/20 0845  . vasopressin 0.04 Units/min (03/07/20 1000)   PRN Meds:.acetaminophen **OR** acetaminophen, albuterol, alteplase, bisacodyl, heparin, lidocaine (PF), lidocaine-prilocaine, pentafluoroprop-tetrafluoroeth, sodium chloride, sodium chloride, sodium chloride flush  ABG    Component Value Date/Time   PHART 7.321 (L) 12/30/2008 1303   PCO2ART 48.5 (H) 12/30/2008 1303   PO2ART 296.0 (H) 12/30/2008 1303   HCO3 27.0 04/30/2018 1212   TCO2 28 04/30/2018 1212   ACIDBASEDEF 1.0 12/30/2008 1303   O2SAT 76.0 04/30/2018 1212    A/P  1. Dialysis dependent AKI/CKD stage IV due to cardiorenal syndrome and massive volume overload.  Donato Heinz, MD [place] Pager: [phone] Office [phone]

## 2020-03-08 DIAGNOSIS — I50810 Right heart failure, unspecified: Secondary | ICD-10-CM | POA: Diagnosis not present

## 2020-03-08 DIAGNOSIS — R570 Cardiogenic shock: Secondary | ICD-10-CM | POA: Diagnosis not present

## 2020-03-08 DIAGNOSIS — N179 Acute kidney failure, unspecified: Secondary | ICD-10-CM | POA: Diagnosis not present

## 2020-03-08 LAB — CBC
HCT: 24.7 % — ABNORMAL LOW (ref 39.0–52.0)
Hemoglobin: 7.5 g/dL — ABNORMAL LOW (ref 13.0–17.0)
MCH: 32.8 pg (ref 26.0–34.0)
MCHC: 30.4 g/dL (ref 30.0–36.0)
MCV: 107.9 fL — ABNORMAL HIGH (ref 80.0–100.0)
Platelets: 70 10*3/uL — ABNORMAL LOW (ref 150–400)
RBC: 2.29 MIL/uL — ABNORMAL LOW (ref 4.22–5.81)
RDW: 19.4 % — ABNORMAL HIGH (ref 11.5–15.5)
WBC: 5.9 10*3/uL (ref 4.0–10.5)
nRBC: 0 % (ref 0.0–0.2)

## 2020-03-08 LAB — RENAL FUNCTION PANEL
Albumin: 3.1 g/dL — ABNORMAL LOW (ref 3.5–5.0)
Albumin: 3.1 g/dL — ABNORMAL LOW (ref 3.5–5.0)
Anion gap: 7 (ref 5–15)
Anion gap: 7 (ref 5–15)
BUN: 14 mg/dL (ref 8–23)
BUN: 15 mg/dL (ref 8–23)
CO2: 27 mmol/L (ref 22–32)
CO2: 26 mmol/L (ref 22–32)
Calcium: 8.7 mg/dL — ABNORMAL LOW (ref 8.9–10.3)
Calcium: 8.6 mg/dL — ABNORMAL LOW (ref 8.9–10.3)
Chloride: 99 mmol/L (ref 98–111)
Chloride: 100 mmol/L (ref 98–111)
Creatinine, Ser: 0.95 mg/dL (ref 0.61–1.24)
Creatinine, Ser: 0.87 mg/dL (ref 0.61–1.24)
GFR, Estimated: >60 mL/min (ref >60)
GFR, Estimated: >60 mL/min (ref >60)
Glucose, Bld: 140 mg/dL — ABNORMAL HIGH (ref 70–99)
Glucose, Bld: 145 mg/dL — ABNORMAL HIGH (ref 70–99)
Phosphorus: 1.9 mg/dL — ABNORMAL LOW (ref 2.5–4.6)
Phosphorus: 1.9 mg/dL — ABNORMAL LOW (ref 2.5–4.6)
Potassium: 4.6 mmol/L (ref 3.5–5.1)
Potassium: 5 mmol/L (ref 3.5–5.1)
Sodium: 133 mmol/L — ABNORMAL LOW (ref 135–145)
Sodium: 133 mmol/L — ABNORMAL LOW (ref 135–145)

## 2020-03-08 LAB — GLUCOSE, CAPILLARY
Glucose-Capillary: 141 mg/dL — ABNORMAL HIGH (ref 70–99)
Glucose-Capillary: 153 mg/dL — ABNORMAL HIGH (ref 70–99)
Glucose-Capillary: 131 mg/dL — ABNORMAL HIGH (ref 70–99)
Glucose-Capillary: 143 mg/dL — ABNORMAL HIGH (ref 70–99)

## 2020-03-08 LAB — PROTIME-INR
INR: 3.2 — ABNORMAL HIGH (ref 0.8–1.2)
Prothrombin Time: 31.7 seconds — ABNORMAL HIGH (ref 11.4–15.2)

## 2020-03-08 LAB — MAGNESIUM: Magnesium: 2.9 mg/dL — ABNORMAL HIGH (ref 1.7–2.4)

## 2020-03-08 MED ORDER — WARFARIN SODIUM 2.5 MG PO TABS
2.5000 mg | ORAL_TABLET | Freq: Once | ORAL | Status: AC
  Administered 2020-03-08: 2.5 mg via ORAL
  Filled 2020-03-08: qty 1

## 2020-03-08 MED ORDER — MIDODRINE HCL 5 MG PO TABS
15.0000 mg | ORAL_TABLET | Freq: Three times a day (TID) | ORAL | Status: DC
  Administered 2020-03-08 – 2020-03-15 (×21): 15 mg via ORAL
  Filled 2020-03-08 (×21): qty 3

## 2020-03-08 NOTE — Progress Notes (Signed)
Patient refused CPAP for tonight. RT instructed patient to have RT called if he changes his mind. RT will monitor as needed. 

## 2020-03-08 NOTE — Progress Notes (Addendum)
[facility] Progress Note   This is an 84 year old male with a history of AVR, OSA on CPAP, afib, chronic coumadin therapy, CHF (EF 40-45%) severe MR not a candidate for conventional surgery, CKDIV with BL cr around 2.5-3.4, CASHD with CABG in 1994, left CEA, ITP, DM< COPD, and BPH with indwelling catheter who presented with severe volume overload after failing outpatient aggressive diuresis. Patient was started on CRRT and has been requiring pressor support.   Assessment/ Plan:   1. CKDIV: BL Cr 2.5-3.4, Cr on admission 3.29. Patient has a history of nephrosclerosis and cardiorenal syndrome and is followed by Dr. Johnney Ou at clinic. Patient arrived significantly volume overloaded, about 45 lbs up from dry weight of 218.Failed aggressive diuresis at home. 1. Started on CRRT 03/03/20 and required initiation of levophed. 2. Using all 4K/2.5Ca bath: Pre-filter 500 ml/hr, post-filter 300 ml/hr, dialysate 1500 ml/hr 3. Goal uf 100-200 ml/hr 4. Temporary HD catheter placement12/1/21 by IR 5. Patient is still requiring Levophed and vasopressin to maintain his blood pressure with CRRT. 6. He is not a candidate for IHD given the need for pressor support with CRRT as well ashis advanced age, multiple co-morbidities, and poor functional/nutritional status. 7. Palliative carehas seen patient and plans to transition off of CRRT and wean pressors to help transition to home hospice care. 8. Will continue with CRRT for now and wean levophed and vasopressin as able to facilitate transition to home with hospice; hopefully we can discontinue CRRT in the next 48-72 hrs 2. Hypokalemia:improved with 4K bath and CRRT. 3. Hypophosphatemia-Received replacementKphos 4. NYHA class IV CHF with volume overload and hypotension- [place] levophed and vasopressin, wean as able.  5. UTI-  Cefazolin x7 days per PCCM 6. Renal osteodystrophy: Phos 5.5, PTH58 7. Anemia -Patient has history of ITP. Receive  feraheme. Hgb today 7.5. 8. CHF -Advanced heart failure following. 9. Mechanical AVR on coumadin goal INR 2.5-3.5 10. 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 COPD 23. DM 14. ITP  Subjective:   Patient reports that he is feeling well today, no new complaints. Denies any fevers, chills, nausea, vomiting, chest pain, SOB, lightheadedness, or dizziness. He expressed understanding that he will be going home soon with hospice.    Objective:   BP (!) 83/61 (BP Location: Left Arm)   Pulse 65   Temp (!) 96.1 F (35.6 C) (Axillary)   Resp 15   Ht 6\' 2"  (1.88 m)   Wt 109.9 kg   SpO2 100%   BMI 31.11 kg/m   Intake/Output Summary (Last 24 hours) at 03/08/2020 3810 Last data filed at 03/08/2020 0800 Gross per 24 hour  Intake 3263.51 ml  Output 6580 ml  Net -3316.49 ml   Weight change: -3.8 kg  Physical Exam: General: Elderly male, NAD, critically ill appearing HEENT: Right IJ in place and on CRRT Cardiac: Systolic murmur, mechanical click, regular rate Pulmonary: Decreased breath sounds throughout, clear to auscultation, 2L [place] Abdomen: Soft, non-tender, non-distended Extremity: Unna boots in place, 1+ pitting edema around knees   Imaging: No results found.  Labs: BMET Recent Labs  Lab 03/05/20 0419 03/05/20 1728 03/06/20 0405 03/06/20 1639 03/07/20 0431 03/07/20 1634 03/08/20 0250  NA 132* 131* 130* 134* 132* 134* 133*  K 4.8 4.5 4.5 4.5 4.4 4.5 4.6  CL 97* 97* 96* 99 99 99 99  CO2 26 25 25 27 27 26 27   GLUCOSE 123* 110* 123* 127* 136* 132* 140*  BUN 34* 26* 24* 22  17 15 14   CREATININE 1.35* 1.12 1.00 1.07 0.92 0.92 0.95  CALCIUM 8.6* 8.3* 8.5* 8.5* 8.3* 8.3* 8.7*  PHOS 2.4* 2.0* 2.5 2.4* 2.1* 1.9* 1.9*   CBC Recent Labs  Lab 03/02/20 1510 03/03/20 0409 03/05/20 0419 03/06/20 0401 03/07/20 0431 03/08/20 0250  WBC 4.4   < > 6.4 7.0 5.7 5.9  NEUTROABS 3.5  --   --   --   --   --   HGB 8.3*   < > 7.7* 7.6* 7.4* 7.5*  HCT 26.2*   < > 26.1* 26.0* 25.7*  24.7*  MCV 103.1*   < > 106.1* 107.4* 108.9* 107.9*  PLT 78*   < > 101* 89* 65* 70*   < > = values in this interval not displayed.    Medications:    . (feeding supplement) PROSource Plus  30 mL Oral BID BM  . allopurinol  100 mg Oral Daily  . B-complex with vitamin C  1 tablet Oral Daily  . cephALEXin  250 mg Oral Q8H  . Chlorhexidine Gluconate Cloth  6 each Topical Q0600  . ezetimibe  10 mg Oral QHS   And  . simvastatin  20 mg Oral QHS  . feeding supplement  237 mL Oral BID BM  . finasteride  5 mg Oral QHS  . folic acid  1,638 mcg Oral Daily  . insulin aspart  0-15 Units Subcutaneous TID WC  . insulin aspart  0-5 Units Subcutaneous QHS  . levothyroxine  75 mcg Oral QAC breakfast  . mouth rinse  15 mL Mouth Rinse BID  . midodrine  10 mg Oral TID WC  . polyethylene glycol  17 g Oral Daily  . senna-docusate  1 tablet Oral BID  . sodium chloride flush  10-40 mL Intracatheter Q12H  . tamsulosin  0.4 mg Oral QPC supper  . vitamin B-12  1,000 mcg Oral QHS  . Warfarin - Pharmacist Dosing Inpatient   Does not apply [id number]   Asencion Noble, M.D. PGY3 03/08/2020 9:04 AM  I have seen and examined this patient and agree with the plan of care. Will support with CRRT + UF as tolerated; currently tolerating. 500/1500/300 pre/dialysate/post UF 100-200 net UF /hr  Royden Bulman, Hunt Oris, MD 03/08/2020, 9:41 AM

## 2020-03-08 NOTE — Progress Notes (Signed)
Patient ID: [name], male   DOB: [birth year], 84 y.o.   MRN: [id number]     Advanced Heart Failure Rounding Note  PCP-Cardiologist: [name], MD   Subjective:    Remains on CVVH, requiring NE 20 and vasopressin 0.04.  UF 100-200 cc/hr net.   No complaints today, just tired.   Objective:   Weight Range: 109.9 kg Body mass index is 31.11 kg/m.   Vital Signs:   Temp:  [96.1 F (35.6 C)-97.2 F (36.2 C)] 96.1 F (35.6 C) (12/06 0810) Pulse Rate:  [56-83] 74 (12/06 0900) Resp:  [10-22] 11 (12/06 0900) BP: (73-127)/(44-94) 88/51 (12/06 0900) SpO2:  [78 %-100 %] 100 % (12/06 0900) Weight:  [109.9 kg] 109.9 kg (12/06 0630) Last BM Date: 03/07/20  Weight change: Filed Weights   03/06/20 0415 03/07/20 0500 03/08/20 0630  Weight: 117 kg 113.7 kg 109.9 kg    Intake/Output:   Intake/Output Summary (Last 24 hours) at 03/08/2020 1033 Last data filed at 03/08/2020 1000 Gross per 24 hour  Intake 3530.87 ml  Output 6834 ml  Net -3303.13 ml      Physical Exam    General: NAD Neck: JVP 10-12 cm, no thyromegaly or thyroid nodule.  Lungs: Decreased at bases. CV: Nondisplaced PMI.  Heart irregular S1/S2 with mechanical S2, no S3/S4, 2/6 HSM apex.  1+ edema to knees. Abdomen: Soft, nontender, no hepatosplenomegaly, no distention.  Skin: Intact without lesions or rashes.  Neurologic: Alert and oriented x 3.  Psych: Normal affect. Extremities: No clubbing or cyanosis.  HEENT: Normal.    Telemetry   A fib 50s (personally reviewed)  EKG    No new EKG to review   Labs    CBC Recent Labs    03/07/20 0431 03/08/20 0250  WBC 5.7 5.9  HGB 7.4* 7.5*  HCT 25.7* 24.7*  MCV 108.9* 107.9*  PLT 65* 70*   Basic Metabolic Panel Recent Labs    03/07/20 0431 03/07/20 0431 03/07/20 1634 03/08/20 0250  NA 132*   < > 134* 133*  K 4.4   < > 4.5 4.6  CL 99   < > 99 99  CO2 27   < > 26 27  GLUCOSE 136*   < > 132* 140*  BUN 17   < > 15 14  CREATININE 0.92   < >  0.92 0.95  CALCIUM 8.3*   < > 8.3* 8.7*  MG 2.6*  --   --  2.9*  PHOS 2.1*   < > 1.9* 1.9*   < > = values in this interval not displayed.   Liver Function Tests Recent Labs    03/07/20 1634 03/08/20 0250  ALBUMIN 3.1* 3.1*   No results for input(s): LIPASE, AMYLASE in the last 72 hours. Cardiac Enzymes No results for input(s): CKTOTAL, CKMB, CKMBINDEX, TROPONINI in the last 72 hours.  BNP: BNP (last 3 results) Recent Labs    05/13/19 1005 12/02/19 0927  BNP 187.2* 169.9*    ProBNP (last 3 results) No results for input(s): PROBNP in the last 8760 hours.   D-Dimer No results for input(s): DDIMER in the last 72 hours. Hemoglobin A1C No results for input(s): HGBA1C in the last 72 hours. Fasting Lipid Panel No results for input(s): CHOL, HDL, LDLCALC, TRIG, CHOLHDL, LDLDIRECT in the last 72 hours. Thyroid Function Tests No results for input(s): TSH, T4TOTAL, T3FREE, THYROIDAB in the last 72 hours.  Invalid input(s): FREET3  Other results:   Imaging  No results found.   Medications:     Scheduled Medications: . (feeding supplement) PROSource Plus  30 mL Oral BID BM  . allopurinol  100 mg Oral Daily  . B-complex with vitamin C  1 tablet Oral Daily  . cephALEXin  250 mg Oral Q8H  . Chlorhexidine Gluconate Cloth  6 each Topical Q0600  . ezetimibe  10 mg Oral QHS   And  . simvastatin  20 mg Oral QHS  . feeding supplement  237 mL Oral BID BM  . finasteride  5 mg Oral QHS  . folic acid  4,166 mcg Oral Daily  . insulin aspart  0-15 Units Subcutaneous TID WC  . insulin aspart  0-5 Units Subcutaneous QHS  . levothyroxine  75 mcg Oral QAC breakfast  . mouth rinse  15 mL Mouth Rinse BID  . midodrine  15 mg Oral TID WC  . polyethylene glycol  17 g Oral Daily  . senna-docusate  1 tablet Oral BID  . sodium chloride flush  10-40 mL Intracatheter Q12H  . tamsulosin  0.4 mg Oral QPC supper  . vitamin B-12  1,000 mcg Oral QHS  . warfarin  2.5 mg Oral ONCE-1600  .  Warfarin - Pharmacist Dosing Inpatient   Does not apply q1600    Infusions: .  prismasol BGK 4/2.5 500 mL/hr at 03/08/20 0918  .  prismasol BGK 4/2.5 300 mL/hr at 03/07/20 2357  . sodium chloride Stopped (03/06/20 0206)  . norepinephrine (LEVOPHED) Adult infusion 20 mcg/min (03/08/20 1000)  . prismasol BGK 4/2.5 1,500 mL/hr at 03/08/20 0852  . vasopressin 0.04 Units/min (03/08/20 1027)    PRN Medications: acetaminophen **OR** acetaminophen, albuterol, alteplase, bisacodyl, heparin, lidocaine (PF), lidocaine-prilocaine, pentafluoroprop-tetrafluoroeth, sodium chloride, sodium chloride, sodium chloride flush   Assessment/Plan   1. Acute on Chronic Diastolic Heart Failure w/ Prominent RV Failure: Echo in 4/21 with EF 50-55%, mildly decreased RV systolic function, severe rheumatic mitral regurgitation, normal mechanical aortic valve. Severe MR likely plays a significant role in CHF and RV failure. NYHA class Illb-IV. Now admitted w/ massive anasarca and failing high dose outpatient diuretics in the setting of progressive CKD.  Started CRRT 12/1 for fluid removal, requiring pressor support.  Remains on NE 20, vasopressin 0.04.  Still volume overloaded but weight trending down.  - Continue CVVH with goal UF 100-200 cc/hr, likely 1-2 more days to get fluid off.  - Increase midodrine to 15 mg tid.  - Given pressor support needed for CVVH, do not think that he will tolerate iHD due to RV failure.  I have discussed this with patient and family and palliative care is following.  Plan at this point will be to get fluid off as well as we can with CVVH, then arrange for home with hospice care.  After he is off CVVH, will wean off pressors.  2. Stage IV CKD => ESRD: CVVH for fluid removal, as above he is not a long-term HD candidate.  3. Chronic Afib: Rate is controlled.  - On warfarin  4. UTI: UC + for Klebsiella Pneumoniae. On cephalexin.  Has chronic foley.  5. Chronic Anemia: IDA, likely 2/2 anemia  of chronic disease from CKD. Hgb stable at 7.5.  Had Feraheme.  6. Mechanical Aortic Valve: On coumadin. INR goal 2.5-3.5, 3.2 today.  - coumadin dosing per pharmacy  7. CAD: s/p CABG. No chest pain.  8. Hypothyroidism: On levothyroxine.  9. 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 Structural heart team evaluated, not a Mitraclip candidate. Seen by Dr. [name], would be high risk for open surgery. He was evaluated at [facility] in [place] for percutaneous mitral valve trials. Unfortunately, he does not qualify for any of the available trials. No option for intervention.    Length of Stay: 6  Loralie Champagne, MD  03/08/2020, 10:33 AM  Advanced Heart Failure Team Pager [phone] (M-F; 7a - 4p)  Please contact [facility] for night-coverage after hours (4p -7a ) and weekends on [email or web address]

## 2020-03-08 NOTE — Progress Notes (Signed)
ANTICOAGULATION CONSULT NOTE   Pharmacy Consult for Warfarin Indication: Mechanical AVR  Allergies  Allergen Reactions  . Rifampin Rash and Other (See Comments)    May have been caused by Vancomycin or Rifampin (??)  . Vancomycin Rash and Other (See Comments)    May have been caused by Vancomycin or Rifampin (??)    Patient Measurements: Height: 6\' 2"  (188 cm) Weight: 109.9 kg (242 lb 4.6 oz) IBW/kg (Calculated) : 82.2 Heparin Dosing Weight:   Vital Signs: Temp: 96.1 F (35.6 C) (12/06 0810) Temp Source: Axillary (12/06 0810) BP: 88/51 (12/06 0900) Pulse Rate: 74 (12/06 0900)  Labs: Recent Labs    03/06/20 0401 03/06/20 0405 03/07/20 0431 03/07/20 1634 03/08/20 0250  HGB 7.6*   < > 7.4*  --  7.5*  HCT 26.0*  --  25.7*  --  24.7*  PLT 89*  --  65*  --  70*  LABPROT 34.9*  --  32.9*  --  31.7*  INR 3.6*  --  3.4*  --  3.2*  CREATININE  --    < > 0.92 0.92 0.95   < > = values in this interval not displayed.    Estimated Creatinine Clearance: 79.1 mL/min (by C-G formula based on SCr of 0.95 mg/dL).  Assessment: 25 yom that is being admitted for SOB after fluid overload in the setting of CKD. Patient is on warfarin at home for a mechanical AVR. INR today within goal at 3.2  PTA Regimen: Warfarin 5mg  Sat and Tues and 2.5mg  ROW  Goal of Therapy:  INR 2.5-3.5 Monitor platelets by anticoagulation protocol: Yes   Plan:  -Warfarin 2.5 mg x 1 again tonight per home dosing -Daily INR, CBC  Sloan Leiter, PharmD, BCPS, BCCCP Clinical Pharmacist Please check AMION for all [place] numbers 03/08/2020 9:44 AM

## 2020-03-08 NOTE — Progress Notes (Signed)

## 2020-03-08 NOTE — Progress Notes (Signed)
NAME:  [name], MRN:  [id number], DOB:  08-29-1935, LOS: 6 ADMISSION DATE:  03/02/2020, CONSULTATION DATE:  03/03/2020 REFERRING MD:  Dr. Kristopher Oppenheim, CHIEF COMPLAINT:  Cardiorenal syndrome   History of present illness   84 year old male presents to ED on 11/30 with progressive shortness of breath and lower extremity edema with CHF and Severe MR with progressive renal failure. Has been followed by Heart Failure and Evaluated by CTS. Determined not a candidate for MitraClip. High risk for conventional surgery. Recommended aggressive medical management.   On arrival to ED patient is massively volume overloaded with poor response to outpatient diuretics. Crt 3.3. BUN 116. INR 2.8. Nephrology and Heart Failure Consulted. Attempted High dose diuretics overnight. Complicated also by hypotension. 12/1 decision made to transfer to ICU for CRRT.   Goals of care conversation with patient. States he is a DNR/DNI however wants to try dialysis, if it does not work would be amendable to comfort.   Past Medical History  Chronic Combined CHF, Severe Mitral Regurgitation, H/O mechanical aortic valve replacement, A.Fib, CAD s/p CABG, CKD stage IV (baseline Crt 2.5-3), ITP anemia  Significant Hospital Events   11/30 > Admit to Hospital  12/1 > Transfer to ICU for CRRT   Consults:  Heart Failure Nephrology  PCCM  Procedures:  Right IJ Vascath 12/1 >>  Significant Diagnostic Tests:  CXR 12/1 > Cardiac shadow is enlarged but stable. Postsurgical changes are again seen and stable. Aortic calcifications are noted. Mild vascular congestion is noted with mild interstitial edema. No focal infiltrate is seen. No bony abnormality is noted.  Micro Data:  Urine Culture 12/1 >>   Antimicrobials:  Cefepime x 1 dose 12/1   Interim history/subjective:   Patient remains critically ill in the intensive care unit on CVVHD and vasopressors, norepinephrine and vasopressin.  Successful volume removal with  CVVHD.  Objective   Blood pressure (!) 89/49, pulse 65, temperature (!) 97.4 F (36.3 C), temperature source Oral, resp. rate 15, height 6\' 2"  (1.88 m), weight 109.9 kg, SpO2 99 %.        Intake/Output Summary (Last 24 hours) at 03/08/2020 2003 Last data filed at 03/08/2020 2000 Gross per 24 hour  Intake 2962.96 ml  Output 6351 ml  Net -3388.04 ml   Filed Weights   03/06/20 0415 03/07/20 0500 03/08/20 0630  Weight: 117 kg 113.7 kg 109.9 kg    Examination: General:Elderly gentleman resting in bed, on CVVHD and vasopressors HEENT: Right IJ HD catheter Neuro: Alert following commands CV: Mechanical heart tones, irregularly irregular PULM:Diminished breath sounds bilaterally no crackles no wheeze GI: Soft, nontender nondistended Extremities: Unna boots in place Skin: Scattered bilateral bruising upper extremities lower extremities  Resolved Hospital Problem list     Assessment & Plan:   Acute on Chronic Heart Failure with severe volume overload and RV Failure with hypotension  Severe MR H/O AVR CAD s/p CABG Cardiogenic shock -Not a candidate for Mitral Clip, not a surgical candidate per CTS. Evaluated at [facility] for percutaneous mitral valve trial however denied  Plan This patient has end-stage acute on chronic decompensated heart failure with right ventricular failure, severe MR not a surgical candidate. We discussed a lot this morning at bedside about next steps. Patient has been tolerating CVVHD for volume removal. Remains on vasopressors norepinephrine and vasopressin. Currently at 20 mics of Levophed to maintain mean arterial pressure greater than 65 mmHg.  We have been having trouble titrating vasopressors. Today we discussed the risk benefits and  alternatives of keeping his vasopressor needs.  Patient would like to be able to get enough volume off and wean from the medications in an effort to transition home with home hospice. This was discussed with cardiology as  well today.  Stage IV CKD (baseline Crt 2.5-3.4)  Cardiorenal Syndrome  Plan  CVVHD per nephrology  UTI  Plan Cefazolin x7 days, stop date  A.Fib on Coumadin Plan Continue Coumadin Holding beta-blockade on vasopressors s  COPD OSA on CPAP at HS  Plan On room air  DM  Plan SSI  ITP Plan Follow CBC  Best practice (evaluated daily)   Diet: Low Salt Diet  DVT prophylaxis: Coumadin as above GI prophylaxis: N/A Glucose control: SSI Mobility: Bedrest  last date of multidisciplinary goals of care discussion: [place] 03/06/2020 documented  Family and staff present: Patient palliative care team Summary of discussion: DNR Follow up goals of care discussion due: This is been daily ongoing discussion. Code Status: DNR Disposition: ICU  Labs    CBC: Recent Labs  Lab 03/02/20 1510 03/03/20 0409 03/04/20 0459 03/05/20 0419 03/06/20 0401 03/07/20 0431 03/08/20 0250  WBC 4.4   < > 5.4 6.4 7.0 5.7 5.9  NEUTROABS 3.5  --   --   --   --   --   --   HGB 8.3*   < > 7.6* 7.7* 7.6* 7.4* 7.5*  HCT 26.2*   < > 24.8* 26.1* 26.0* 25.7* 24.7*  MCV 103.1*   < > 102.5* 106.1* 107.4* 108.9* 107.9*  PLT 78*   < > 109* 101* 89* 65* 70*   < > = values in this interval not displayed.    Basic Metabolic Panel: Recent Labs  Lab 03/04/20 0459 03/04/20 0500 03/05/20 0419 03/05/20 1728 03/06/20 0401 03/06/20 0405 03/06/20 1639 03/07/20 0431 03/07/20 1634 03/08/20 0250 03/08/20 1630  NA  --    < > 132*   < >  --    < > 134* 132* 134* 133* 133*  K  --    < > 4.8   < >  --    < > 4.5 4.4 4.5 4.6 5.0  CL  --    < > 97*   < >  --    < > 99 99 99 99 100  CO2  --    < > 26   < >  --    < > 27 27 26 27 26   GLUCOSE  --    < > 123*   < >  --    < > 127* 136* 132* 140* 145*  BUN  --    < > 34*   < >  --    < > 22 17 15 14 15   CREATININE  --    < > 1.35*   < >  --    < > 1.07 0.92 0.92 0.95 0.87  CALCIUM  --    < > 8.6*   < >  --    < > 8.5* 8.3* 8.3* 8.7* 8.6*  MG 2.5*  --  2.5*  --  2.3   --   --  2.6*  --  2.9*  --   PHOS  --    < > 2.4*   < >  --    < > 2.4* 2.1* 1.9* 1.9* 1.9*   < > = values in this interval not displayed.   GFR: Estimated Creatinine Clearance: 86.4 mL/min (by C-G formula based on  SCr of 0.87 mg/dL). Recent Labs  Lab 03/05/20 0419 03/06/20 0401 03/07/20 0431 03/08/20 0250  WBC 6.4 7.0 5.7 5.9    Liver Function Tests: Recent Labs  Lab 03/02/20 1510 03/02/20 1510 03/03/20 0409 03/03/20 1533 03/06/20 1639 03/07/20 0431 03/07/20 1634 03/08/20 0250 03/08/20 1630  AST 28  --  24  --   --   --   --   --   --   ALT 20  --  18  --   --   --   --   --   --   ALKPHOS 136*  --  118  --   --   --   --   --   --   BILITOT 1.1  --  1.3*  --   --   --   --   --   --   PROT 6.4*  --  6.0*  --   --   --   --   --   --   ALBUMIN 3.2*   < > 3.1*   < > 3.1* 2.9* 3.1* 3.1* 3.1*   < > = values in this interval not displayed.   No results for input(s): LIPASE, AMYLASE in the last 168 hours. No results for input(s): AMMONIA in the last 168 hours.  ABG    Component Value Date/Time   PHART 7.321 (L) 12/30/2008 1303   PCO2ART 48.5 (H) 12/30/2008 1303   PO2ART 296.0 (H) 12/30/2008 1303   HCO3 27.0 04/30/2018 1212   TCO2 28 04/30/2018 1212   ACIDBASEDEF 1.0 12/30/2008 1303   O2SAT 76.0 04/30/2018 1212     Coagulation Profile: Recent Labs  Lab 03/04/20 0459 03/05/20 0419 03/06/20 0401 03/07/20 0431 03/08/20 0250  INR 3.1* 3.8* 3.6* 3.4* 3.2*    Cardiac Enzymes: No results for input(s): CKTOTAL, CKMB, CKMBINDEX, TROPONINI in the last 168 hours.  HbA1C: Hgb A1c MFr Bld  Date/Time Value Ref Range Status  03/25/2019 09:53 AM 5.6 4.6 - 6.5 % Final    Comment:    Glycemic Control Guidelines for People with Diabetes:Non Diabetic:  <6%Goal of Therapy: <7%Additional Action Suggested:  >8%   07/01/2018 09:28 AM 5.7 4.6 - 6.5 % Final    Comment:    Glycemic Control Guidelines for People with Diabetes:Non Diabetic:  <6%Goal of Therapy: <7%Additional  Action Suggested:  >8%     CBG: Recent Labs  Lab 03/07/20 1623 03/07/20 2119 03/08/20 0808 03/08/20 1131 03/08/20 1554  GLUCAP 111* 158* 141* 153* 131*    This patient is critically ill with multiple organ system failure; which, requires frequent high complexity decision making, assessment, support, evaluation, and titration of therapies. This was completed through the application of advanced monitoring technologies and extensive interpretation of multiple databases. During this encounter critical care time was devoted to patient care services described in this note for 31 minutes.  Garner Nash, DO [facility] 03/08/2020 8:03 PM

## 2020-03-09 DIAGNOSIS — I509 Heart failure, unspecified: Secondary | ICD-10-CM

## 2020-03-09 DIAGNOSIS — R601 Generalized edema: Secondary | ICD-10-CM | POA: Diagnosis not present

## 2020-03-09 DIAGNOSIS — N189 Chronic kidney disease, unspecified: Secondary | ICD-10-CM | POA: Diagnosis not present

## 2020-03-09 DIAGNOSIS — N179 Acute kidney failure, unspecified: Secondary | ICD-10-CM | POA: Diagnosis not present

## 2020-03-09 LAB — RENAL FUNCTION PANEL
Albumin: 3 g/dL — ABNORMAL LOW (ref 3.5–5.0)
Albumin: 3.1 g/dL — ABNORMAL LOW (ref 3.5–5.0)
Anion gap: 9 (ref 5–15)
Anion gap: 10 (ref 5–15)
BUN: 17 mg/dL (ref 8–23)
BUN: 18 mg/dL (ref 8–23)
CO2: 25 mmol/L (ref 22–32)
CO2: 25 mmol/L (ref 22–32)
Calcium: 8.7 mg/dL — ABNORMAL LOW (ref 8.9–10.3)
Calcium: 8.8 mg/dL — ABNORMAL LOW (ref 8.9–10.3)
Chloride: 99 mmol/L (ref 98–111)
Chloride: 97 mmol/L — ABNORMAL LOW (ref 98–111)
Creatinine, Ser: 0.91 mg/dL (ref 0.61–1.24)
Creatinine, Ser: 0.99 mg/dL (ref 0.61–1.24)
GFR, Estimated: >60 mL/min (ref >60)
GFR, Estimated: >60 mL/min (ref >60)
Glucose, Bld: 141 mg/dL — ABNORMAL HIGH (ref 70–99)
Glucose, Bld: 123 mg/dL — ABNORMAL HIGH (ref 70–99)
Phosphorus: 1.6 mg/dL — ABNORMAL LOW (ref 2.5–4.6)
Phosphorus: 1.8 mg/dL — ABNORMAL LOW (ref 2.5–4.6)
Potassium: 4.6 mmol/L (ref 3.5–5.1)
Potassium: 4.9 mmol/L (ref 3.5–5.1)
Sodium: 133 mmol/L — ABNORMAL LOW (ref 135–145)
Sodium: 132 mmol/L — ABNORMAL LOW (ref 135–145)

## 2020-03-09 LAB — CBC
HCT: 25.1 % — ABNORMAL LOW (ref 39.0–52.0)
Hemoglobin: 7.3 g/dL — ABNORMAL LOW (ref 13.0–17.0)
MCH: 31.7 pg (ref 26.0–34.0)
MCHC: 29.1 g/dL — ABNORMAL LOW (ref 30.0–36.0)
MCV: 109.1 fL — ABNORMAL HIGH (ref 80.0–100.0)
Platelets: 71 10*3/uL — ABNORMAL LOW (ref 150–400)
RBC: 2.3 MIL/uL — ABNORMAL LOW (ref 4.22–5.81)
RDW: 20 % — ABNORMAL HIGH (ref 11.5–15.5)
WBC: 7.7 10*3/uL (ref 4.0–10.5)
nRBC: 0 % (ref 0.0–0.2)

## 2020-03-09 LAB — PROTIME-INR
INR: 2.6 — ABNORMAL HIGH (ref 0.8–1.2)
Prothrombin Time: 26.9 seconds — ABNORMAL HIGH (ref 11.4–15.2)

## 2020-03-09 LAB — GLUCOSE, CAPILLARY
Glucose-Capillary: 133 mg/dL — ABNORMAL HIGH (ref 70–99)
Glucose-Capillary: 137 mg/dL — ABNORMAL HIGH (ref 70–99)
Glucose-Capillary: 119 mg/dL — ABNORMAL HIGH (ref 70–99)
Glucose-Capillary: 120 mg/dL — ABNORMAL HIGH (ref 70–99)

## 2020-03-09 LAB — MAGNESIUM: Magnesium: 2.6 mg/dL — ABNORMAL HIGH (ref 1.7–2.4)

## 2020-03-09 MED ORDER — ENSURE ENLIVE PO LIQD
237.0000 mL | ORAL | Status: DC
  Administered 2020-03-10 – 2020-03-16 (×7): 237 mL via ORAL

## 2020-03-09 MED ORDER — WARFARIN SODIUM 5 MG PO TABS
5.0000 mg | ORAL_TABLET | Freq: Once | ORAL | Status: AC
  Administered 2020-03-09: 5 mg via ORAL
  Filled 2020-03-09: qty 1

## 2020-03-09 MED ORDER — GERHARDT'S BUTT CREAM
TOPICAL_CREAM | CUTANEOUS | Status: DC | PRN
  Filled 2020-03-09: qty 1

## 2020-03-09 NOTE — Progress Notes (Addendum)
Patient ID: [name], male   DOB: November 04, 1935, 84 y.o.   MRN: [id number]     Advanced Heart Failure Rounding Note  PCP-Cardiologist: [name], MD   Subjective:    Remains on CVVH, requiring NE 25 and vasopressin 0.04.   5L pulled off yesterday.   Remains markedly volume overloaded.   Resting in bed. No complaints currently.   Objective:   Weight Range: 110 kg Body mass index is 31.14 kg/m.   Vital Signs:   Temp:  [96.1 F (35.6 C)-98.3 F (36.8 C)] 98.3 F (36.8 C) (12/07 0343) Pulse Rate:  [59-74] 68 (12/07 0700) Resp:  [11-19] 17 (12/07 0700) BP: (79-100)/(45-70) 89/54 (12/07 0700) SpO2:  [91 %-100 %] 97 % (12/07 0700) Weight:  [110 kg] 110 kg (12/07 0432) Last BM Date: 03/08/20  Weight change: Filed Weights   03/07/20 0500 03/08/20 0630 03/09/20 0432  Weight: 113.7 kg 109.9 kg 110 kg    Intake/Output:   Intake/Output Summary (Last 24 hours) at 03/09/2020 0710 Last data filed at 03/09/2020 0700 Gross per 24 hour  Intake 2561.98 ml  Output 5419 ml  Net -2857.02 ml      Physical Exam    PHYSICAL EXAM: General:  elderly fatigue appearing. No respiratory difficulty HEENT: normal Neck: supple.  JVD elevated to ear. Rt IJ HD cath. Carotids 2+ bilat; no bruits. No lymphadenopathy or thyromegaly appreciated. Cor: PMI nondisplaced. Irregularly irregular rate and rhythm. No rubs, gallops or murmurs. Lungs: clear, no wheezing Abdomen: soft, nontender, nondistended. No hepatosplenomegaly. No bruits or masses. Good bowel sounds. Extremities: no cyanosis, clubbing, rash, 3+ edema up to thighs, + bilateral unna boots  Neuro: alert & oriented x 3, cranial nerves grossly intact. moves all 4 extremities w/o difficulty. Affect pleasant.     Telemetry   A fib 70s (personally reviewed)  EKG    No new EKG to review   Labs    CBC Recent Labs    03/08/20 0250 03/09/20 0307  WBC 5.9 7.7  HGB 7.5* 7.3*  HCT 24.7* 25.1*  MCV 107.9* 109.1*  PLT 70* 71*    Basic Metabolic Panel Recent Labs    03/08/20 0250 03/08/20 0250 03/08/20 1630 03/09/20 0307  NA 133*   < > 133* 133*  K 4.6   < > 5.0 4.6  CL 99   < > 100 99  CO2 27   < > 26 25  GLUCOSE 140*   < > 145* 141*  BUN 14   < > 15 17  CREATININE 0.95   < > 0.87 0.91  CALCIUM 8.7*   < > 8.6* 8.7*  MG 2.9*  --   --  2.6*  PHOS 1.9*   < > 1.9* 1.6*   < > = values in this interval not displayed.   Liver Function Tests Recent Labs    03/08/20 1630 03/09/20 0307  ALBUMIN 3.1* 3.0*   No results for input(s): LIPASE, AMYLASE in the last 72 hours. Cardiac Enzymes No results for input(s): CKTOTAL, CKMB, CKMBINDEX, TROPONINI in the last 72 hours.  BNP: BNP (last 3 results) Recent Labs    05/13/19 1005 12/02/19 0927  BNP 187.2* 169.9*    ProBNP (last 3 results) No results for input(s): PROBNP in the last 8760 hours.   D-Dimer No results for input(s): DDIMER in the last 72 hours. Hemoglobin A1C No results for input(s): HGBA1C in the last 72 hours. Fasting Lipid Panel No results for input(s): CHOL, HDL, LDLCALC,  TRIG, CHOLHDL, LDLDIRECT in the last 72 hours. Thyroid Function Tests No results for input(s): TSH, T4TOTAL, T3FREE, THYROIDAB in the last 72 hours.  Invalid input(s): FREET3  Other results:   Imaging    No results found.   Medications:     Scheduled Medications: . (feeding supplement) PROSource Plus  30 mL Oral BID BM  . allopurinol  100 mg Oral Daily  . B-complex with vitamin C  1 tablet Oral Daily  . cephALEXin  250 mg Oral Q8H  . Chlorhexidine Gluconate Cloth  6 each Topical Q0600  . ezetimibe  10 mg Oral QHS   And  . simvastatin  20 mg Oral QHS  . feeding supplement  237 mL Oral BID BM  . finasteride  5 mg Oral QHS  . folic acid  6,948 mcg Oral Daily  . insulin aspart  0-15 Units Subcutaneous TID WC  . insulin aspart  0-5 Units Subcutaneous QHS  . levothyroxine  75 mcg Oral QAC breakfast  . mouth rinse  15 mL Mouth Rinse BID  . midodrine   15 mg Oral TID WC  . polyethylene glycol  17 g Oral Daily  . senna-docusate  1 tablet Oral BID  . sodium chloride flush  10-40 mL Intracatheter Q12H  . tamsulosin  0.4 mg Oral QPC supper  . vitamin B-12  1,000 mcg Oral QHS  . Warfarin - Pharmacist Dosing Inpatient   Does not apply q1600    Infusions: .  prismasol BGK 4/2.5 500 mL/hr at 03/09/20 0515  .  prismasol BGK 4/2.5 300 mL/hr at 03/08/20 1656  . sodium chloride Stopped (03/06/20 0206)  . norepinephrine (LEVOPHED) Adult infusion 26 mcg/min (03/09/20 0700)  . prismasol BGK 4/2.5 1,500 mL/hr at 03/09/20 0429  . vasopressin 0.04 Units/min (03/09/20 0700)    PRN Medications: acetaminophen **OR** acetaminophen, albuterol, alteplase, bisacodyl, heparin, lidocaine (PF), lidocaine-prilocaine, pentafluoroprop-tetrafluoroeth, sodium chloride, sodium chloride, sodium chloride flush   Assessment/Plan   1. Acute on Chronic Diastolic Heart Failure w/ Prominent RV Failure: Echo in 4/21 with EF 50-55%, mildly decreased RV systolic function, severe rheumatic mitral regurgitation, normal mechanical aortic valve. Severe MR likely plays a significant role in CHF and RV failure. NYHA class Illb-IV. Now admitted w/ massive anasarca and failing high dose outpatient diuretics in the setting of progressive CKD.  Started CRRT 12/1 for fluid removal, requiring pressor support.  Remains on NE 26, vasopressin 0.04.  Still  Significantly volume overloaded. - Continue CVVH with goal UF 100-200 cc/hr, likely 1-2 more days to get fluid off.  - Continue midodrine 15 mg tid.  - Given pressor support needed for CVVH, do not think that he will tolerate iHD due to RV failure.  I have discussed this with patient and family and palliative care is following.  Plan at this point will be to get fluid off as well as we can with CVVH, then arrange for home with hospice care.  After he is off CVVH, will wean off pressors.  2. Stage IV CKD => ESRD: CVVH for fluid removal, as  above he is not a long-term HD candidate.  3. Chronic Afib: Rate is controlled.  - On warfarin  4. UTI: UC + for Klebsiella Pneumoniae. On cephalexin.  Has chronic foley.  5. Chronic Anemia: IDA, likely 2/2 anemia of chronic disease from CKD. Hgb 7.5.  Had Feraheme. transfuse for hgb < 7.0  6. Mechanical Aortic Valve: On coumadin. INR goal 2.5-3.5, 2.6 today.  - coumadin dosing per pharmacy  7. CAD: s/p CABG. No chest pain.  8. Hypothyroidism: On levothyroxine.  9. Severe MR: TEE 7/19 with severe MR with possible rheumatic MV (do not think MS is significant, mildly elevated mean gradient from high flow with severe MR). Structural heart team evaluated, not a Mitraclip candidate. Seen by Dr. [name], would be high risk for open surgery. He was evaluated at [facility] in [place] for percutaneous mitral valve trials. Unfortunately, he does not qualify for any of the available trials. No option for intervention.    Length of Stay: [address], PA-C  03/09/2020, 7:10 AM  Advanced Heart Failure Team Pager [phone] (M-F; [place])  Please contact [facility] for night-coverage after hours (4p -7a ) and weekends on [email or web address]  Agree with the above note.   Still requiring high pressor doses for ultrafiltration via CVVH.  On maximal midodrine now. Still with volume overload.   Given pressor support needed for CVVH, do not think that he will tolerate iHD due to RV failure.  I have discussed this with patient and family and palliative care is following.  Plan at this point will be to get fluid off as well as we can with CVVH, then arrange for home with hospice care.  After he is off CVVH, will wean off pressors.   Loralie Champagne 03/09/2020

## 2020-03-09 NOTE — Progress Notes (Signed)
[name]   84 year old male with a history of AVR, OSA on CPAP, afib, chronic coumadin therapy, CHF (EF 40-45%) severe MR not a candidate for conventional surgery, CKDIV with BL cr around 2.5-3.4, CASHD with CABG in 1994, left CEA, ITP, DM< COPD, and BPH with indwelling catheter who presented with severe volume overload after failing outpatient aggressive diuresis. Patient was started on CRRT and has been requiring pressor support.   Assessment/ Plan:   1. CKDIV: BL Cr 2.5-3.4, Cr on admission 3.29. Patient has a history of nephrosclerosis and cardiorenal syndrome and is followed by Dr. Johnney Ou at clinic. Patient arrived significantly volume overloaded, about 45 lbs up from dry weight of 218.Failed aggressive diuresis at home. 1. Started on CRRT 03/03/20 and required initiation of levophed. 2. Using all 4K/2.5Ca bath: Pre-filter 500 ml/hr, post-filter 300 ml/hr, dialysate 1500 ml/hr 3. Goal uf 100-200 ml/hr  4. Temporary HD catheter placement12/1/21 by IR 5. Patient is still requiring Levophed and vasopressin to maintain his blood pressure with CRRT. 6. He is nota candidate for IHD given the need for pressor support with CRRT as well ashis advanced age, multiple co-morbidities, and poor functional/nutritional status. 7. Palliative carehas seen patient and plans to transition off of CRRT and wean pressors tohelp transition tohomehospice care. 8. Will continue with CRRT for now and wean levophed and vasopressin as able to facilitate transition to home with hospice; hopefully we can discontinue CRRT for another  24-72 hrs; still has e/o excessive volume onboard and he is tolerating UF. 4K baths 500/1500/300 pre/dial/post. Currently Net neg 18.3L 2. Hypokalemia:improved with 4K bath and CRRT. 3. Hypophosphatemia-Received replacementKphos 4. NYHA class IV CHF with volume overload and hypotension- [place] levophed and vasopressin, wean as able.  5. UTI-   Cefazolin x7 days per PCCM 6. Renal osteodystrophy: Phos 5.5, PTH58 7. Anemia -Patient has history of ITP. Receive feraheme. Hgb today 7.5. 8. CHF -Advanced heart failure following. 9. Mechanical AVR on coumadin goal INR 2.5-3.5 10. 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 COPD 20. DM 14. ITP  Subjective:   Levo up to 26 + vasopressin but weaned down to 24 of levo. Tolerated UF overnight; he feels more comfortable and has an appetite.   Objective:   BP (!) 106/50   Pulse 68   Temp 97.9 F (36.6 C) (Oral)   Resp 16   Ht 6\' 2"  (1.88 m)   Wt 110 kg   SpO2 96%   BMI 31.14 kg/m   Intake/Output Summary (Last 24 hours) at 03/09/2020 0017 Last data filed at 03/09/2020 0800 Gross per 24 hour  Intake 2207.02 ml  Output 5253 ml  Net -3045.98 ml   Weight change: 0.1 kg  Physical Exam: General: Elderly male, NAD, critically ill appearing HEENT: Right IJ in place and on CRRT Cardiac: Systolic murmur, mechanical click, regular rate Pulmonary: Decreased breath sounds throughout, clear to auscultation, 2L [employer] Abdomen: Soft, non-tender, non-distended Extremity: Unna boots in place, 1+ pitting edema around knees  Imaging: No results found.  Labs: BMET Recent Labs  Lab 03/06/20 0405 03/06/20 1639 03/07/20 0431 03/07/20 1634 03/08/20 0250 03/08/20 1630 03/09/20 0307  NA 130* 134* 132* 134* 133* 133* 133*  K 4.5 4.5 4.4 4.5 4.6 5.0 4.6  CL 96* 99 99 99 99 100 99  CO2 25 27 27 26 27 26 25   GLUCOSE 123* 127* 136* 132* 140* 145* 141*  BUN 24* 22 17 15 14 15 17   CREATININE  1.00 1.07 0.92 0.92 0.95 0.87 0.91  CALCIUM 8.5* 8.5* 8.3* 8.3* 8.7* 8.6* 8.7*  PHOS 2.5 2.4* 2.1* 1.9* 1.9* 1.9* 1.6*   CBC Recent Labs  Lab 03/02/20 1510 03/03/20 0409 03/06/20 0401 03/07/20 0431 03/08/20 0250 03/09/20 0307  WBC 4.4   < > 7.0 5.7 5.9 7.7  NEUTROABS 3.5  --   --   --   --   --   HGB 8.3*   < > 7.6* 7.4* 7.5* 7.3*  HCT 26.2*   < > 26.0* 25.7* 24.7* 25.1*  MCV 103.1*   < >  107.4* 108.9* 107.9* 109.1*  PLT 78*   < > 89* 65* 70* 71*   < > = values in this interval not displayed.    Medications:    . (feeding supplement) PROSource Plus  30 mL Oral BID BM  . allopurinol  100 mg Oral Daily  . B-complex with vitamin C  1 tablet Oral Daily  . cephALEXin  250 mg Oral Q8H  . Chlorhexidine Gluconate Cloth  6 each Topical Q0600  . ezetimibe  10 mg Oral QHS   And  . simvastatin  20 mg Oral QHS  . feeding supplement  237 mL Oral BID BM  . finasteride  5 mg Oral QHS  . folic acid  9,407 mcg Oral Daily  . insulin aspart  0-15 Units Subcutaneous TID WC  . insulin aspart  0-5 Units Subcutaneous QHS  . levothyroxine  75 mcg Oral QAC breakfast  . mouth rinse  15 mL Mouth Rinse BID  . midodrine  15 mg Oral TID WC  . polyethylene glycol  17 g Oral Daily  . senna-docusate  1 tablet Oral BID  . sodium chloride flush  10-40 mL Intracatheter Q12H  . tamsulosin  0.4 mg Oral QPC supper  . vitamin B-12  1,000 mcg Oral QHS  . Warfarin - Pharmacist Dosing Inpatient   Does not apply q1600      Otelia Santee, MD 03/09/2020, 8:14 AM

## 2020-03-09 NOTE — Progress Notes (Addendum)
Palliative Care Progress Note  [name] appears weaker today but he is still communicating well and coping well with his current situation and prognosis. His goal is to be home-to spend his last Christmas with his family if that is at all possible. He knows that going home may not be possible and if we are able to get him to that point it would be with hospice care. He is tolerating CRRT and fluid is coming off-slowly but he remains pressor dependent. Will continue to try to wean pressors and remove fluid as much as he can tolerate and hopefully have a window so that he can make it home even if for a short period of time.   He expressed concerns about not having a living will or HCPOA-I reassured him that was not needed at this point and that we are all clear on what is medically possible and what his goals are. Otherwise he tells me most of his affairs are in order. He has no other complaints or expressed needs at this time- will maintain current interventions and reassess daily.  Lane Hacker, DO Palliative Medicine  Time: 20 min Greater than 50%  of this time was spent counseling and coordinating care related to the above assessment and plan.

## 2020-03-09 NOTE — Progress Notes (Signed)
Patient refused CPAP for tonight. RT instructed patient to have RT called if he changes his mind. RT will monitor as needed. 

## 2020-03-09 NOTE — Progress Notes (Signed)
ANTICOAGULATION CONSULT NOTE   Pharmacy Consult for Warfarin Indication: Mechanical AVR  Allergies  Allergen Reactions  . Rifampin Rash and Other (See Comments)    May have been caused by Vancomycin or Rifampin (??)  . Vancomycin Rash and Other (See Comments)    May have been caused by Vancomycin or Rifampin (??)    Patient Measurements: Height: 6\' 2"  (188 cm) Weight: 110 kg (242 lb 8.1 oz) IBW/kg (Calculated) : 82.2 Heparin Dosing Weight:   Vital Signs: Temp: 97.9 F (36.6 C) (12/07 0700) Temp Source: Oral (12/07 0700) BP: 110/55 (12/07 1000) Pulse Rate: 66 (12/07 1000)  Labs: Recent Labs    03/07/20 0431 03/07/20 1634 03/08/20 0250 03/08/20 1630 03/09/20 0307  HGB 7.4*  --  7.5*  --  7.3*  HCT 25.7*  --  24.7*  --  25.1*  PLT 65*  --  70*  --  71*  LABPROT 32.9*  --  31.7*  --  26.9*  INR 3.4*  --  3.2*  --  2.6*  CREATININE 0.92   < > 0.95 0.87 0.91   < > = values in this interval not displayed.    Estimated Creatinine Clearance: 82.6 mL/min (by C-G formula based on SCr of 0.91 mg/dL).  Assessment: 76 yom that is being admitted for SOB after fluid overload in the setting of CKD. Patient is on warfarin at home for a mechanical AVR. INR today within goal at 2.6 - trend down but expected based on home regimen and increased dose needed today per regimen.   PTA Regimen: Warfarin 5mg  Sat and Tues and 2.5mg  ROW Hgb low-stable 7.3. Platelets low-stable at 71. No bleeding noted.   Goal of Therapy:  INR 2.5-3.5 Monitor platelets by anticoagulation protocol: Yes   Plan:  -Warfarin 5 mg x 1 again tonight per home dosing -Daily INR, CBC  Sloan Leiter, PharmD, BCPS, BCCCP Clinical Pharmacist Please check AMION for all [facility] numbers 03/09/2020 10:24 AM

## 2020-03-09 NOTE — Progress Notes (Signed)
NAME:  [name], MRN:  [id number], DOB:  1935/05/06, LOS: 7 ADMISSION DATE:  03/02/2020, CONSULTATION DATE:  03/03/2020 REFERRING MD:  Dr. Kristopher Oppenheim, CHIEF COMPLAINT:  Cardiorenal syndrome   History of present illness   84 year old male presents to ED on 11/30 with progressive shortness of breath and lower extremity edema with CHF and Severe MR with progressive renal failure. Has been followed by Heart Failure and Evaluated by CTS. Determined not a candidate for MitraClip. High risk for conventional surgery. Recommended aggressive medical management.   On arrival to ED patient is massively volume overloaded with poor response to outpatient diuretics. Crt 3.3. BUN 116. INR 2.8. Nephrology and Heart Failure Consulted. Attempted High dose diuretics overnight. Complicated also by hypotension. 12/1 decision made to transfer to ICU for CRRT.   Goals of care conversation with patient. States he is a DNR/DNI however wants to try dialysis, if it does not work would be amendable to comfort.   Past Medical History  Chronic Combined CHF, Severe Mitral Regurgitation, H/O mechanical aortic valve replacement, A.Fib, CAD s/p CABG, CKD stage IV (baseline Crt 2.5-3), ITP anemia  Significant Hospital Events   11/30 > Admit to Hospital  12/1 > Transfer to ICU for CRRT   Consults:  Heart Failure Nephrology  PCCM  Procedures:  Right IJ Vascath 12/1 >>  Significant Diagnostic Tests:  CXR 12/1 > Cardiac shadow is enlarged but stable. Postsurgical changes are again seen and stable. Aortic calcifications are noted. Mild vascular congestion is noted with mild interstitial edema. No focal infiltrate is seen. No bony abnormality is noted.  Micro Data:  Urine Culture 12/1 >>   Antimicrobials:  Cefepime x 1 dose 12/1   Interim history/subjective:   Patient remains critically ill in the intensive care unit on CVVHD and vasopressors  Objective   Blood pressure (!) 89/54, pulse 68, temperature 97.9 F  (36.6 C), temperature source Oral, resp. rate 17, height 6\' 2"  (1.88 m), weight 110 kg, SpO2 97 %.        Intake/Output Summary (Last 24 hours) at 03/09/2020 0803 Last data filed at 03/09/2020 0800 Gross per 24 hour  Intake 2207.02 ml  Output 5253 ml  Net -3045.98 ml   Filed Weights   03/07/20 0500 03/08/20 0630 03/09/20 0432  Weight: 113.7 kg 109.9 kg 110 kg    Examination: General:Elderly male in ICU on CVVHD vasopressors HEENT: Right IJ dialysis catheter Neuro: Alert following commands CV: Mechanical heart tones irregularly irregular PULM:Diminished breath sounds bilaterally no wheeze GI: Obese soft nontender Extremities: Unna boots in place Skin: Bruising present  Resolved Hospital Problem list     Assessment & Plan:   Acute on Chronic Heart Failure with severe volume overload and RV Failure with hypotension  Severe MR H/O AVR CAD s/p CABG Cardiogenic shock -Not a candidate for Mitral Clip, not a surgical candidate per CTS. Evaluated at [facility] for percutaneous mitral valve trial however denied  Plan At this point patient remains critically ill in the intensive care unit with decompensated cardiogenic shock. Requiring vasopressor support and continuous CVVHD for volume removal.  Patient's ultimate goal is to transition home with home hospice however I am not sure this is going to be able. He wishes aggressive care at this time. I am not sure were going to be able to get him home. I think we should potentially consider other options such as transition to home with dobutamine but even with this he is on 24 mics of Levophed this  morning. I think we need to continue to work with palliative care.  I do suspect at this point a in hospital death if we transfer position to comfort care.  We appreciate palliative care input.  Stage IV CKD (baseline Crt 2.5-3.4)  Cardiorenal Syndrome  Plan  CVVHD per nephrology  UTI  Plan Cefazolin x7 days  A.Fib on  Coumadin Plan Continue Coumadin Holding beta-blockade on vasopressors s  COPD OSA on CPAP at HS  Plan On room air  DM  Plan SSI  ITP Plan Follow CBC  Best practice (evaluated daily)   Diet: Low Salt Diet  DVT prophylaxis: Coumadin as above GI prophylaxis: N/A Glucose control: SSI Mobility: Bedrest  last date of multidisciplinary goals of care discussion: [place] 03/06/2020 documented  Family and staff present: Patient palliative care team Summary of discussion: DNR Follow up goals of care discussion due: This is been daily ongoing discussion. Code Status: DNR Disposition: ICU  Labs    CBC: Recent Labs  Lab 03/02/20 1510 03/03/20 0409 03/05/20 0419 03/06/20 0401 03/07/20 0431 03/08/20 0250 03/09/20 0307  WBC 4.4   < > 6.4 7.0 5.7 5.9 7.7  NEUTROABS 3.5  --   --   --   --   --   --   HGB 8.3*   < > 7.7* 7.6* 7.4* 7.5* 7.3*  HCT 26.2*   < > 26.1* 26.0* 25.7* 24.7* 25.1*  MCV 103.1*   < > 106.1* 107.4* 108.9* 107.9* 109.1*  PLT 78*   < > 101* 89* 65* 70* 71*   < > = values in this interval not displayed.    Basic Metabolic Panel: Recent Labs  Lab 03/05/20 0419 03/05/20 1728 03/06/20 0401 03/06/20 0405 03/07/20 0431 03/07/20 1634 03/08/20 0250 03/08/20 1630 03/09/20 0307  NA 132*   < >  --    < > 132* 134* 133* 133* 133*  K 4.8   < >  --    < > 4.4 4.5 4.6 5.0 4.6  CL 97*   < >  --    < > 99 99 99 100 99  CO2 26   < >  --    < > 27 26 27 26 25   GLUCOSE 123*   < >  --    < > 136* 132* 140* 145* 141*  BUN 34*   < >  --    < > 17 15 14 15 17   CREATININE 1.35*   < >  --    < > 0.92 0.92 0.95 0.87 0.91  CALCIUM 8.6*   < >  --    < > 8.3* 8.3* 8.7* 8.6* 8.7*  MG 2.5*  --  2.3  --  2.6*  --  2.9*  --  2.6*  PHOS 2.4*   < >  --    < > 2.1* 1.9* 1.9* 1.9* 1.6*   < > = values in this interval not displayed.   GFR: Estimated Creatinine Clearance: 82.6 mL/min (by C-G formula based on SCr of 0.91 mg/dL). Recent Labs  Lab 03/06/20 0401 03/07/20 0431  03/08/20 0250 03/09/20 0307  WBC 7.0 5.7 5.9 7.7    Liver Function Tests: Recent Labs  Lab 03/02/20 1510 03/02/20 1510 03/03/20 0409 03/03/20 1533 03/07/20 0431 03/07/20 1634 03/08/20 0250 03/08/20 1630 03/09/20 0307  AST 28  --  24  --   --   --   --   --   --   ALT 20  --  18  --   --   --   --   --   --   ALKPHOS 136*  --  118  --   --   --   --   --   --   BILITOT 1.1  --  1.3*  --   --   --   --   --   --   PROT 6.4*  --  6.0*  --   --   --   --   --   --   ALBUMIN 3.2*   < > 3.1*   < > 2.9* 3.1* 3.1* 3.1* 3.0*   < > = values in this interval not displayed.   No results for input(s): LIPASE, AMYLASE in the last 168 hours. No results for input(s): AMMONIA in the last 168 hours.  ABG    Component Value Date/Time   PHART 7.321 (L) 12/30/2008 1303   PCO2ART 48.5 (H) 12/30/2008 1303   PO2ART 296.0 (H) 12/30/2008 1303   HCO3 27.0 04/30/2018 1212   TCO2 28 04/30/2018 1212   ACIDBASEDEF 1.0 12/30/2008 1303   O2SAT 76.0 04/30/2018 1212     Coagulation Profile: Recent Labs  Lab 03/05/20 0419 03/06/20 0401 03/07/20 0431 03/08/20 0250 03/09/20 0307  INR 3.8* 3.6* 3.4* 3.2* 2.6*    Cardiac Enzymes: No results for input(s): CKTOTAL, CKMB, CKMBINDEX, TROPONINI in the last 168 hours.  HbA1C: Hgb A1c MFr Bld  Date/Time Value Ref Range Status  03/25/2019 09:53 AM 5.6 4.6 - 6.5 % Final    Comment:    Glycemic Control Guidelines for People with Diabetes:Non Diabetic:  <6%Goal of Therapy: <7%Additional Action Suggested:  >8%   07/01/2018 09:28 AM 5.7 4.6 - 6.5 % Final    Comment:    Glycemic Control Guidelines for People with Diabetes:Non Diabetic:  <6%Goal of Therapy: <7%Additional Action Suggested:  >8%     CBG: Recent Labs  Lab 03/08/20 0808 03/08/20 1131 03/08/20 1554 03/08/20 2151 03/09/20 0733  GLUCAP 141* 153* 131* 143* 133*    This patient is critically ill with multiple organ system failure; which, requires frequent high complexity decision  making, assessment, support, evaluation, and titration of therapies. This was completed through the application of advanced monitoring technologies and extensive interpretation of multiple databases. During this encounter critical care time was devoted to patient care services described in this note for 31 minutes.   Garner Nash, DO [facility] 03/09/2020 8:03 AM

## 2020-03-10 ENCOUNTER — Telehealth (HOSPITAL_COMMUNITY): Payer: Self-pay

## 2020-03-10 DIAGNOSIS — R570 Cardiogenic shock: Secondary | ICD-10-CM | POA: Diagnosis not present

## 2020-03-10 DIAGNOSIS — R601 Generalized edema: Secondary | ICD-10-CM | POA: Diagnosis not present

## 2020-03-10 DIAGNOSIS — N179 Acute kidney failure, unspecified: Secondary | ICD-10-CM | POA: Diagnosis not present

## 2020-03-10 LAB — CBC
HCT: 23.6 % — ABNORMAL LOW (ref 39.0–52.0)
Hemoglobin: 6.9 g/dL — CL (ref 13.0–17.0)
MCH: 32.4 pg (ref 26.0–34.0)
MCHC: 29.2 g/dL — ABNORMAL LOW (ref 30.0–36.0)
MCV: 110.8 fL — ABNORMAL HIGH (ref 80.0–100.0)
Platelets: 61 10*3/uL — ABNORMAL LOW (ref 150–400)
RBC: 2.13 MIL/uL — ABNORMAL LOW (ref 4.22–5.81)
RDW: 21.1 % — ABNORMAL HIGH (ref 11.5–15.5)
WBC: 6.3 10*3/uL (ref 4.0–10.5)
nRBC: 0 % (ref 0.0–0.2)

## 2020-03-10 LAB — RENAL FUNCTION PANEL
Albumin: 3 g/dL — ABNORMAL LOW (ref 3.5–5.0)
Albumin: 3.1 g/dL — ABNORMAL LOW (ref 3.5–5.0)
Anion gap: 10 (ref 5–15)
Anion gap: 9 (ref 5–15)
BUN: 19 mg/dL (ref 8–23)
BUN: 22 mg/dL (ref 8–23)
CO2: 24 mmol/L (ref 22–32)
CO2: 25 mmol/L (ref 22–32)
Calcium: 8.6 mg/dL — ABNORMAL LOW (ref 8.9–10.3)
Calcium: 8.5 mg/dL — ABNORMAL LOW (ref 8.9–10.3)
Chloride: 99 mmol/L (ref 98–111)
Chloride: 99 mmol/L (ref 98–111)
Creatinine, Ser: 1 mg/dL (ref 0.61–1.24)
Creatinine, Ser: 1.02 mg/dL (ref 0.61–1.24)
GFR, Estimated: >60 mL/min (ref >60)
GFR, Estimated: >60 mL/min (ref >60)
Glucose, Bld: 127 mg/dL — ABNORMAL HIGH (ref 70–99)
Glucose, Bld: 107 mg/dL — ABNORMAL HIGH (ref 70–99)
Phosphorus: 1.6 mg/dL — ABNORMAL LOW (ref 2.5–4.6)
Phosphorus: 1.5 mg/dL — ABNORMAL LOW (ref 2.5–4.6)
Potassium: 4.4 mmol/L (ref 3.5–5.1)
Potassium: 4.5 mmol/L (ref 3.5–5.1)
Sodium: 133 mmol/L — ABNORMAL LOW (ref 135–145)
Sodium: 133 mmol/L — ABNORMAL LOW (ref 135–145)

## 2020-03-10 LAB — PREPARE RBC (CROSSMATCH)

## 2020-03-10 LAB — GLUCOSE, CAPILLARY
Glucose-Capillary: 126 mg/dL — ABNORMAL HIGH (ref 70–99)
Glucose-Capillary: 144 mg/dL — ABNORMAL HIGH (ref 70–99)
Glucose-Capillary: 117 mg/dL — ABNORMAL HIGH (ref 70–99)
Glucose-Capillary: 124 mg/dL — ABNORMAL HIGH (ref 70–99)

## 2020-03-10 LAB — MAGNESIUM: Magnesium: 2.7 mg/dL — ABNORMAL HIGH (ref 1.7–2.4)

## 2020-03-10 LAB — PROTIME-INR
INR: 2.4 — ABNORMAL HIGH (ref 0.8–1.2)
Prothrombin Time: 25.4 seconds — ABNORMAL HIGH (ref 11.4–15.2)

## 2020-03-10 MED ORDER — WARFARIN SODIUM 5 MG PO TABS: 5.0000 mg | ORAL_TABLET | ORAL | Status: DC

## 2020-03-10 MED ORDER — WARFARIN SODIUM 2.5 MG PO TABS
2.5000 mg | ORAL_TABLET | ORAL | Status: DC
  Administered 2020-03-10: 2.5 mg via ORAL
  Filled 2020-03-10: qty 1

## 2020-03-10 MED ORDER — SODIUM CHLORIDE 0.9% IV SOLUTION: Freq: Once | INTRAVENOUS | Status: AC

## 2020-03-10 NOTE — Telephone Encounter (Addendum)
Pt is in hospital at this time, the plan is for him to get as much fluid off as possible and if he can for him to go home for hospice comfort care.  So pt will be d/c from paramedicine.   Marylouise Stacks, EMT-Paramedic  03/10/20

## 2020-03-10 NOTE — Progress Notes (Signed)
[facility] ([facility]) Community Based Palliative Care       This patient is enrolled in our palliative care services in the community.  [facility] will continue to follow for any discharge planning needs and to coordinate continuation of palliative care.   If you have questions or need assistance, please call [phone] or contact the hospital Liaison listed on AMION.     Thank you for the opportunity to participate in this patient's care.     Chrislyn King, BSN, RN [facility] Hospital Liaison   [phone] 

## 2020-03-10 NOTE — Progress Notes (Signed)
Pt refused CPAP for the night.   

## 2020-03-10 NOTE — Progress Notes (Signed)
[facility] Progress Note Patient Name: Ralf Konopka DOB: [birth year] MRN: [id number]   Date of Service  03/10/2020  HPI/Events of Note  Hemoglobin 6.9 gm / dl.  eICU Interventions  Transfuse 1 unit PRBC.     Intervention Category Intermediate Interventions: Bleeding - evaluation and treatment with blood products  Bradleigh Sonnen U Haniyah Maciolek 03/10/2020, 3:26 AM

## 2020-03-10 NOTE — Progress Notes (Signed)
ANTICOAGULATION CONSULT NOTE   Pharmacy Consult for Warfarin Indication: Mechanical AVR  Allergies  Allergen Reactions  . Rifampin Rash and Other (See Comments)    May have been caused by Vancomycin or Rifampin (??)  . Vancomycin Rash and Other (See Comments)    May have been caused by Vancomycin or Rifampin (??)    Patient Measurements: Height: 6\' 2"  (188 cm) Weight: 106 kg (233 lb 11 oz) IBW/kg (Calculated) : 82.2 Heparin Dosing Weight:   Vital Signs: Temp: 97.4 F (36.3 C) (12/08 0800) Temp Source: Oral (12/08 0800) BP: 105/52 (12/08 1100) Pulse Rate: 65 (12/08 1100)  Labs: Recent Labs    03/08/20 0250 03/08/20 1630 03/09/20 0307 03/09/20 1632 03/10/20 0222  HGB 7.5*  --  7.3*  --  6.9*  HCT 24.7*  --  25.1*  --  23.6*  PLT 70*  --  71*  --  61*  LABPROT 31.7*  --  26.9*  --  25.4*  INR 3.2*  --  2.6*  --  2.4*  CREATININE 0.95   < > 0.91 0.99 1.00   < > = values in this interval not displayed.    Estimated Creatinine Clearance: 73.9 mL/min (by C-G formula based on SCr of 1 mg/dL).  Assessment: 34 yom that is being admitted for SOB after fluid overload in the setting of CKD. Patient is on warfarin at home for a mechanical AVR. INR slightly below goal at 2.4 -received increased dose yesterday and expect to be in range tomorrow.   Hgb down to 6.9 - receiving 1 unit PRBCs. Platelets down a 61. No overt bleeding noted by RN or on exam.   PTA Regimen: Warfarin 5mg  Sat and Tues and 2.5mg  ROW Hgb low-stable 7.3. Platelets low-stable at 71. No bleeding noted.   Goal of Therapy:  INR 2.5-3.5 Monitor platelets by anticoagulation protocol: Yes   Plan:  -Warfarin 2.5 mg x 1 as per home regimen - resume home regimen of 5mg  Saturdays and Tuesdays and 2.5mg  all other days.  -Daily INR, CBC -- consider INR three times a week if remains stable on home regimen and inpatient  Sloan Leiter, PharmD, BCPS, BCCCP Clinical Pharmacist Please check AMION for all [facility]  numbers 03/10/2020 11:30 AM

## 2020-03-10 NOTE — Progress Notes (Signed)
Patient ID: [name], male   DOB: 08/29/1935, 84 y.o.   MRN: [id number]     Advanced Heart Failure Rounding Note  PCP-Cardiologist: [name], MD   Subjective:    Remains on CVVH, requiring NE 10 and vasopressin 0.04.  Weight continues to come down, UF at 100-200 cc/hr net.    Comfortable in bed.   Objective:   Weight Range: 106 kg Body mass index is 30 kg/m.   Vital Signs:   Temp:  [97.2 F (36.2 C)-98.3 F (36.8 C)] 97.7 F (36.5 C) (12/08 1540) Pulse Rate:  [56-74] 63 (12/08 1500) Resp:  [14-32] 22 (12/08 1500) BP: (88-114)/(42-74) 104/59 (12/08 1500) SpO2:  [94 %-100 %] 100 % (12/08 1500) Weight:  [106 kg] 106 kg (12/08 0500) Last BM Date: 03/10/20  Weight change: Filed Weights   03/08/20 0630 03/09/20 0432 03/10/20 0500  Weight: 109.9 kg 110 kg 106 kg    Intake/Output:   Intake/Output Summary (Last 24 hours) at 03/10/2020 1601 Last data filed at 03/10/2020 1500 Gross per 24 hour  Intake 2120.58 ml  Output 4996 ml  Net -2875.42 ml      Physical Exam    General: NAD Neck: JVP 10 cm, no thyromegaly or thyroid nodule.  Lungs: Decreased at bases.  CV: Nondisplaced PMI.  Heart irregular S1/S2 with mechanical S2, no S3/S4, 2/6 HSM apex.  1+ edema to knees.  Abdomen: Soft, nontender, no hepatosplenomegaly, no distention.  Skin: Intact without lesions or rashes.  Neurologic: Alert and oriented x 3.  Psych: Normal affect. Extremities: No clubbing or cyanosis.  HEENT: Normal.    Telemetry   A fib 70s (personally reviewed)  EKG    No new EKG to review   Labs    CBC Recent Labs    03/09/20 0307 03/10/20 0222  WBC 7.7 6.3  HGB 7.3* 6.9*  HCT 25.1* 23.6*  MCV 109.1* 110.8*  PLT 71* 61*   Basic Metabolic Panel Recent Labs    03/09/20 0307 03/09/20 0307 03/09/20 1632 03/10/20 0222  NA 133*   < > 132* 133*  K 4.6   < > 4.9 4.4  CL 99   < > 97* 99  CO2 25   < > 25 24  GLUCOSE 141*   < > 123* 127*  BUN 17   < > 18 19  CREATININE  0.91   < > 0.99 1.00  CALCIUM 8.7*   < > 8.8* 8.6*  MG 2.6*  --   --  2.7*  PHOS 1.6*   < > 1.8* 1.6*   < > = values in this interval not displayed.   Liver Function Tests Recent Labs    03/09/20 1632 03/10/20 0222  ALBUMIN 3.1* 3.0*   No results for input(s): LIPASE, AMYLASE in the last 72 hours. Cardiac Enzymes No results for input(s): CKTOTAL, CKMB, CKMBINDEX, TROPONINI in the last 72 hours.  BNP: BNP (last 3 results) Recent Labs    05/13/19 1005 12/02/19 0927  BNP 187.2* 169.9*    ProBNP (last 3 results) No results for input(s): PROBNP in the last 8760 hours.   D-Dimer No results for input(s): DDIMER in the last 72 hours. Hemoglobin A1C No results for input(s): HGBA1C in the last 72 hours. Fasting Lipid Panel No results for input(s): CHOL, HDL, LDLCALC, TRIG, CHOLHDL, LDLDIRECT in the last 72 hours. Thyroid Function Tests No results for input(s): TSH, T4TOTAL, T3FREE, THYROIDAB in the last 72 hours.  Invalid input(s): FREET3  Other results:   Imaging    No results found.   Medications:     Scheduled Medications: . (feeding supplement) PROSource Plus  30 mL Oral BID BM  . allopurinol  100 mg Oral Daily  . B-complex with vitamin C  1 tablet Oral Daily  . Chlorhexidine Gluconate Cloth  6 each Topical Q0600  . ezetimibe  10 mg Oral QHS   And  . simvastatin  20 mg Oral QHS  . feeding supplement  237 mL Oral Q24H  . finasteride  5 mg Oral QHS  . folic acid  6,789 mcg Oral Daily  . insulin aspart  0-15 Units Subcutaneous TID WC  . insulin aspart  0-5 Units Subcutaneous QHS  . levothyroxine  75 mcg Oral QAC breakfast  . mouth rinse  15 mL Mouth Rinse BID  . midodrine  15 mg Oral TID WC  . polyethylene glycol  17 g Oral Daily  . senna-docusate  1 tablet Oral BID  . sodium chloride flush  10-40 mL Intracatheter Q12H  . tamsulosin  0.4 mg Oral QPC supper  . vitamin B-12  1,000 mcg Oral QHS  . warfarin  2.5 mg Oral Once per day on Sun Mon Wed Thu Fri   . [START ON 03/13/2020] warfarin  5 mg Oral Once per day on Tue Sat  . Warfarin - Pharmacist Dosing Inpatient   Does not apply q1600    Infusions: .  prismasol BGK 4/2.5 500 mL/hr at 03/10/20 1227  .  prismasol BGK 4/2.5 300 mL/hr at 03/10/20 1144  . sodium chloride Stopped (03/10/20 0725)  . norepinephrine (LEVOPHED) Adult infusion 11 mcg/min (03/10/20 1500)  . prismasol BGK 4/2.5 1,500 mL/hr at 03/10/20 1441  . vasopressin 0.04 Units/min (03/10/20 1500)    PRN Medications: acetaminophen **OR** acetaminophen, albuterol, alteplase, bisacodyl, Gerhardt's butt cream, heparin, lidocaine (PF), lidocaine-prilocaine, pentafluoroprop-tetrafluoroeth, sodium chloride, sodium chloride, sodium chloride flush   Assessment/Plan   1. Acute on Chronic Diastolic Heart Failure w/ Prominent RV Failure: Echo in 4/21 with EF 50-55%, mildly decreased RV systolic function, severe rheumatic mitral regurgitation, normal mechanical aortic valve. Severe MR likely plays a significant role in CHF and RV failure. NYHA class Illb-IV. Now admitted w/ massive anasarca and failing high dose outpatient diuretics in the setting of progressive CKD.  Started CRRT 12/1 for fluid removal, requiring pressor support.  Remains on NE 10, vasopressin 0.04.  Weight steadily decreasing with CVVH.  Improving volume status, still overloaded.  - Continue CVVH with goal UF 100-200 cc/hr for one more day, think we can stop it tomorrow.   - Continue midodrine 15 mg tid.  - Given pressor support needed for CVVH, do not think that he will tolerate iHD due to RV failure.  I have discussed this with patient and family and palliative care is following.  Plan at this point will be to get fluid off as well as we can with CVVH, then arrange for home with hospice care.  After he is off CVVH, will try to wean off pressors.  2. Stage IV CKD => ESRD: CVVH for fluid removal, as above he is not a long-term HD candidate.  3. Chronic Afib: Rate is  controlled.  - On warfarin  4. UTI: UC + for Klebsiella Pneumoniae. Completed cephalexin.   5. Chronic Anemia: IDA, likely 2/2 anemia of chronic disease from CKD. Hgb 7.5.  Had Feraheme. transfuse for hgb < 7.0  6. Mechanical Aortic Valve: On coumadin. INR goal 2.5-3.5. - coumadin  dosing per pharmacy  7. CAD: s/p CABG. No chest pain.  8. Hypothyroidism: On levothyroxine.  9. Severe MR: TEE 7/19 with severe MR with possible rheumatic MV (do not think MS is significant, mildly elevated mean gradient from high flow with severe MR). Structural heart team evaluated, not a Mitraclip candidate. Seen by Dr. [name], would be high risk for open surgery. He was evaluated at [facility] in [place] for percutaneous mitral valve trials. Unfortunately, he does not qualify for any of the available trials. No option for intervention.   Length of Stay: 8  Loralie Champagne, MD  03/10/2020, 4:01 PM  Advanced Heart Failure Team Pager [phone] (M-F; 7a - 4p)  Please contact [facility] for night-coverage after hours (4p -7a ) and weekends on [email or web address]

## 2020-03-10 NOTE — Progress Notes (Signed)
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 Patient was started on CRRT and has been requiring pressor support.  Assessment/ Plan:   1. CKDIV: BL Cr 2.5-3.4, Cr on admission 3.29. Patient has a history of nephrosclerosis and cardiorenal syndrome and is followed by Dr. Johnney Ou at clinic. Patient arrived significantly volume overloaded, about 45 lbs up from dry weight of 218.Failed aggressive diuresis at home. 1. Started on CRRT 03/03/20 and required initiation of levophed. 2. Using all 4K/2.5Ca bath: Pre-filter 500 ml/hr, post-filter 300 ml/hr, dialysate 1500 ml/hr 3. Goal uf 100-200 ml/hr -> neg 16.2L during this hospitalization. 4. Temporary HD catheter placement12/1/21 by IR 5. Patient is still requiring Levophedand vasopressinto maintain his blood pressure with CRRT but requirements are decreasing. 6. He is nota candidate for IHD given the need for pressor support with CRRT as well ashis advanced age, multiple co-morbidities, and poor functional/nutritional status. 7. Palliative carehas seen patient and plans to transition off of CRRT and wean pressors tohelp transition tohomehospice care. 8. Will continue with CRRT for now and wean levophedand vasopressin. Still has e/o excessive volume onboard but he  is tolerating UF (100-200 ml/hr). 4K baths 500/1500/300 pre/dial/post. Let's try and get him off CRRT Thur or Fri and transition him to home hospice. Breathing much improved. 2. Hypokalemia:improved with 4K bath and CRRT. 3. Hypophosphatemia-ReceivedreplacementKphos 4. NYHA class IV CHF with volume overload and hypotension-  Nowon levophedand vasopressin, wean as able. 5. UTI-Cefazolin x7 daysper PCCM 6. Renal osteodystrophy: Phos 5.5, PTH58 7. Anemia -Patient has history of ITP. Receive feraheme. Hgb today 7.5. 8. CHF -Advanced heart failure following. 9. Mechanical AVR on coumadin goal INR 2.5-3.5 10. 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 COPD 69. DM 14. ITP  Subjective:   Levo down  to 15 + vasopressin; improving pressor requirements. He feels more comfortable and has an appetite.   Objective:   BP 93/72   Pulse 66   Temp 98.2 F (36.8 C) (Oral)   Resp 14   Ht 6\' 2"  (1.88 m)   Wt 106 kg   SpO2 97%   BMI 30.00 kg/m   Intake/Output Summary (Last 24 hours) at 03/10/2020 1001 Last data filed at 03/10/2020 1000 Gross per 24 hour  Intake 2412.1 ml  Output 4852 ml  Net -2439.9 ml   Weight change: -4 kg  Physical Exam: General: Elderly male, NAD, critically ill appearing HEENT: Right IJ in place and on CRRT Cardiac: Systolic murmur, mechanical click, regular rate Pulmonary: Decreased breath sounds throughout, clear to auscultation Abdomen: Soft, non-tender, non-distended Extremity: 1+ pitting edema around knees  Imaging: No results found.  Labs: BMET Recent Labs  Lab 03/07/20 0431 03/07/20 1634 03/08/20 0250 03/08/20 1630 03/09/20 0307 03/09/20 1632 03/10/20 0222  NA 132* 134* 133* 133* 133* 132* 133*  K 4.4 4.5 4.6 5.0 4.6 4.9 4.4  CL 99 99 99 100 99 97* 99  CO2 27 26 27 26 25 25 24   GLUCOSE 136* 132* 140* 145* 141* 123* 127*  BUN 17 15 14 15 17 18 19   CREATININE 0.92 0.92 0.95 0.87 0.91 0.99 1.00  CALCIUM 8.3* 8.3* 8.7* 8.6* 8.7* 8.8*  8.6*  PHOS 2.1* 1.9* 1.9* 1.9* 1.6* 1.8* 1.6*   CBC Recent Labs  Lab 03/07/20 0431 03/08/20 0250 03/09/20 0307 03/10/20 0222  WBC 5.7 5.9 7.7 6.3  HGB 7.4* 7.5* 7.3* 6.9*  HCT 25.7* 24.7* 25.1* 23.6*  MCV 108.9* 107.9* 109.1* 110.8*  PLT 65* 70* 71* 61*    Medications:    . (feeding supplement) PROSource Plus  30  mL Oral BID BM  . allopurinol  100 mg Oral Daily  . B-complex with vitamin C  1 tablet Oral Daily  . Chlorhexidine Gluconate Cloth  6 each Topical Q0600  . ezetimibe  10 mg Oral QHS   And  . simvastatin  20 mg Oral QHS  . feeding supplement  237 mL Oral Q24H  . finasteride  5 mg Oral QHS  . folic acid  3,295 mcg Oral Daily  . insulin aspart  0-15 Units Subcutaneous TID WC  . insulin aspart  0-5 Units Subcutaneous QHS  . levothyroxine  75 mcg Oral QAC breakfast  . mouth rinse  15 mL Mouth Rinse BID  . midodrine  15 mg Oral TID WC  . polyethylene glycol  17 g Oral Daily  . senna-docusate  1 tablet Oral BID  . sodium chloride flush  10-40 mL Intracatheter Q12H  . tamsulosin  0.4 mg Oral QPC supper  . vitamin B-12  1,000 mcg Oral QHS  . Warfarin - Pharmacist Dosing Inpatient   Does not apply q1600      Otelia Santee, MD 03/10/2020, 10:01 AM

## 2020-03-10 NOTE — Progress Notes (Signed)
NAME:  [name], MRN:  [id number], DOB:  1935-10-27, LOS: 8 ADMISSION DATE:  03/02/2020, CONSULTATION DATE:  03/03/2020 REFERRING MD:  Dr. Kristopher Oppenheim, CHIEF COMPLAINT:  Cardiorenal syndrome   History of present illness   84 year old male presents to ED on 11/30 with progressive shortness of breath and lower extremity edema with CHF and Severe MR with progressive renal failure. Has been followed by Heart Failure and Evaluated by CTS. Determined not a candidate for MitraClip. High risk for conventional surgery. Recommended aggressive medical management.   On arrival to ED patient is massively volume overloaded with poor response to outpatient diuretics. Crt 3.3. BUN 116. INR 2.8. Nephrology and Heart Failure Consulted. Attempted High dose diuretics overnight. Complicated also by hypotension. 12/1 decision made to transfer to ICU for CRRT.   Goals of care conversation with patient. States he is a DNR/DNI however wants to try dialysis, if it does not work would be amendable to comfort.   Past Medical History  Chronic Combined CHF, Severe Mitral Regurgitation, H/O mechanical aortic valve replacement, A.Fib, CAD s/p CABG, CKD stage IV (baseline Crt 2.5-3), ITP anemia  Significant Hospital Events   11/30 > Admit to Hospital  12/1 > Transfer to ICU for CRRT   Consults:  Heart Failure Nephrology  PCCM  Procedures:  Right IJ Vascath 12/1 >>  Significant Diagnostic Tests:  CXR 12/1 > Cardiac shadow is enlarged but stable. Postsurgical changes are again seen and stable. Aortic calcifications are noted. Mild vascular congestion is noted with mild interstitial edema. No focal infiltrate is seen. No bony abnormality is noted.  Micro Data:  Urine Culture 12/1 >>   Antimicrobials:  Cefepime x 1 dose 12/1   Interim history/subjective:   Patient remains in ICU on vasopressor   Objective   Blood pressure 93/72, pulse 66, temperature 98.2 F (36.8 C), temperature source Oral, resp. rate 14,  height 6\' 2"  (1.88 m), weight 106 kg, SpO2 97 %.        Intake/Output Summary (Last 24 hours) at 03/10/2020 0912 Last data filed at 03/10/2020 0800 Gross per 24 hour  Intake 2356.2 ml  Output 4748 ml  Net -2391.8 ml   Filed Weights   03/08/20 0630 03/09/20 0432 03/10/20 0500  Weight: 109.9 kg 110 kg 106 kg    Examination: General:elderly male, resting in bed HEENT: R IJ HD cath  Neuro: alert following commands  CV: S1 s2, mechanical heart tones  PULM:diminshed BL breath sounds  GI: obese soft nt nd  Extremities unna boots  Skin: brusing present   Resolved Hospital Problem list     Assessment & Plan:   Acute on Chronic Heart Failure with severe volume overload and RV Failure with hypotension  Severe MR H/O AVR CAD s/p CABG Cardiogenic shock -Not a candidate for Mitral Clip, not a surgical candidate per CTS. Evaluated at [facility] for percutaneous mitral valve trial however denied  Plan  continue to wean NEPI and Vaso  Continue midodrine  Trying to get off pressors and cvvhd so we can transition to home hospice Hopeful to do this by the end of the week  Low hemoglobin No sign of bleeding  - transfused one unit  - continue to observe  Stage IV CKD (baseline Crt 2.5-3.4)  Cardiorenal Syndrome  Plan  CVVHD per nephrology  UTI  Plan Cefazolin x7 days  A.Fib on Coumadin Plan Continue coumadin  Holding bb(-)  COPD OSA on CPAP at [facility]  Plan On room air  DM  Plan SSI  ITP Plan Follow CBC  Best practice (evaluated daily)   Diet: Low Salt Diet  DVT prophylaxis: Coumadin as above GI prophylaxis: N/A Glucose control: SSI Mobility: Bedrest  last date of multidisciplinary goals of care discussion: [place] 03/06/2020 documented  Family and staff present: Patient palliative care team Summary of discussion: DNR Follow up goals of care discussion due: This is been daily ongoing discussion. Code Status: DNR Disposition: ICU  Labs    CBC: Recent Labs  Lab  03/06/20 0401 03/07/20 0431 03/08/20 0250 03/09/20 0307 03/10/20 0222  WBC 7.0 5.7 5.9 7.7 6.3  HGB 7.6* 7.4* 7.5* 7.3* 6.9*  HCT 26.0* 25.7* 24.7* 25.1* 23.6*  MCV 107.4* 108.9* 107.9* 109.1* 110.8*  PLT 89* 65* 70* 71* 61*    Basic Metabolic Panel: Recent Labs  Lab 03/06/20 0401 03/06/20 0405 03/07/20 0431 03/07/20 1634 03/08/20 0250 03/08/20 1630 03/09/20 0307 03/09/20 1632 03/10/20 0222  NA  --    < > 132*   < > 133* 133* 133* 132* 133*  K  --    < > 4.4   < > 4.6 5.0 4.6 4.9 4.4  CL  --    < > 99   < > 99 100 99 97* 99  CO2  --    < > 27   < > 27 26 25 25 24   GLUCOSE  --    < > 136*   < > 140* 145* 141* 123* 127*  BUN  --    < > 17   < > 14 15 17 18 19   CREATININE  --    < > 0.92   < > 0.95 0.87 0.91 0.99 1.00  CALCIUM  --    < > 8.3*   < > 8.7* 8.6* 8.7* 8.8* 8.6*  MG 2.3  --  2.6*  --  2.9*  --  2.6*  --  2.7*  PHOS  --    < > 2.1*   < > 1.9* 1.9* 1.6* 1.8* 1.6*   < > = values in this interval not displayed.   GFR: Estimated Creatinine Clearance: 73.9 mL/min (by C-G formula based on SCr of 1 mg/dL). Recent Labs  Lab 03/07/20 0431 03/08/20 0250 03/09/20 0307 03/10/20 0222  WBC 5.7 5.9 7.7 6.3    Liver Function Tests: Recent Labs  Lab 03/08/20 0250 03/08/20 1630 03/09/20 0307 03/09/20 1632 03/10/20 0222  ALBUMIN 3.1* 3.1* 3.0* 3.1* 3.0*   No results for input(s): LIPASE, AMYLASE in the last 168 hours. No results for input(s): AMMONIA in the last 168 hours.  ABG    Component Value Date/Time   PHART 7.321 (L) 12/30/2008 1303   PCO2ART 48.5 (H) 12/30/2008 1303   PO2ART 296.0 (H) 12/30/2008 1303   HCO3 27.0 04/30/2018 1212   TCO2 28 04/30/2018 1212   ACIDBASEDEF 1.0 12/30/2008 1303   O2SAT 76.0 04/30/2018 1212     Coagulation Profile: Recent Labs  Lab 03/06/20 0401 03/07/20 0431 03/08/20 0250 03/09/20 0307 03/10/20 0222  INR 3.6* 3.4* 3.2* 2.6* 2.4*    Cardiac Enzymes: No results for input(s): CKTOTAL, CKMB, CKMBINDEX, TROPONINI in  the last 168 hours.  HbA1C: Hgb A1c MFr Bld  Date/Time Value Ref Range Status  03/25/2019 09:53 AM 5.6 4.6 - 6.5 % Final    Comment:    Glycemic Control Guidelines for People with Diabetes:Non Diabetic:  <6%Goal of Therapy: <7%Additional Action Suggested:  >8%   07/01/2018 09:28 AM 5.7 4.6 - 6.5 %  Final    Comment:    Glycemic Control Guidelines for People with Diabetes:Non Diabetic:  <6%Goal of Therapy: <7%Additional Action Suggested:  >8%     CBG: Recent Labs  Lab 03/09/20 0733 03/09/20 1138 03/09/20 1513 03/09/20 2155 03/10/20 0734  GLUCAP 133* 137* 119* 120* 126*    This patient is critically ill with multiple organ system failure; which, requires frequent high complexity decision making, assessment, support, evaluation, and titration of therapies. This was completed through the application of advanced monitoring technologies and extensive interpretation of multiple databases. During this encounter critical care time was devoted to patient care services described in this note for 31 minutes.  [facility] 03/10/2020 9:12 AM

## 2020-03-10 NOTE — Progress Notes (Signed)
Notified E-link patient's hemoglobin is 6.9.

## 2020-03-11 ENCOUNTER — Telehealth: Payer: Self-pay | Admitting: Internal Medicine

## 2020-03-11 DIAGNOSIS — N179 Acute kidney failure, unspecified: Secondary | ICD-10-CM | POA: Diagnosis not present

## 2020-03-11 DIAGNOSIS — R601 Generalized edema: Secondary | ICD-10-CM | POA: Diagnosis not present

## 2020-03-11 DIAGNOSIS — M62838 Other muscle spasm: Secondary | ICD-10-CM

## 2020-03-11 LAB — BPAM RBC
Blood Product Expiration Date: 202112262359
ISSUE DATE / TIME: 202112080507
Unit Type and Rh: 5100

## 2020-03-11 LAB — RENAL FUNCTION PANEL
Albumin: 2.9 g/dL — ABNORMAL LOW (ref 3.5–5.0)
Albumin: 3 g/dL — ABNORMAL LOW (ref 3.5–5.0)
Anion gap: 9 (ref 5–15)
Anion gap: 10 (ref 5–15)
BUN: 21 mg/dL (ref 8–23)
BUN: 19 mg/dL (ref 8–23)
CO2: 24 mmol/L (ref 22–32)
CO2: 25 mmol/L (ref 22–32)
Calcium: 8.6 mg/dL — ABNORMAL LOW (ref 8.9–10.3)
Calcium: 8.5 mg/dL — ABNORMAL LOW (ref 8.9–10.3)
Chloride: 98 mmol/L (ref 98–111)
Chloride: 98 mmol/L (ref 98–111)
Creatinine, Ser: 1.19 mg/dL (ref 0.61–1.24)
Creatinine, Ser: 1.2 mg/dL (ref 0.61–1.24)
GFR, Estimated: >60 mL/min (ref >60)
GFR, Estimated: >60 mL/min (ref >60)
Glucose, Bld: 118 mg/dL — ABNORMAL HIGH (ref 70–99)
Glucose, Bld: 170 mg/dL — ABNORMAL HIGH (ref 70–99)
Phosphorus: 1.5 mg/dL — ABNORMAL LOW (ref 2.5–4.6)
Phosphorus: 2 mg/dL — ABNORMAL LOW (ref 2.5–4.6)
Potassium: 4.1 mmol/L (ref 3.5–5.1)
Potassium: 4.6 mmol/L (ref 3.5–5.1)
Sodium: 131 mmol/L — ABNORMAL LOW (ref 135–145)
Sodium: 133 mmol/L — ABNORMAL LOW (ref 135–145)

## 2020-03-11 LAB — GLUCOSE, CAPILLARY
Glucose-Capillary: 115 mg/dL — ABNORMAL HIGH (ref 70–99)
Glucose-Capillary: 144 mg/dL — ABNORMAL HIGH (ref 70–99)
Glucose-Capillary: 176 mg/dL — ABNORMAL HIGH (ref 70–99)
Glucose-Capillary: 147 mg/dL — ABNORMAL HIGH (ref 70–99)

## 2020-03-11 LAB — TYPE AND SCREEN
ABO/RH(D): O NEG
Antibody Screen: NEGATIVE
Unit division: 0

## 2020-03-11 LAB — CBC
HCT: 25 % — ABNORMAL LOW (ref 39.0–52.0)
Hemoglobin: 7.4 g/dL — ABNORMAL LOW (ref 13.0–17.0)
MCH: 31.6 pg (ref 26.0–34.0)
MCHC: 29.6 g/dL — ABNORMAL LOW (ref 30.0–36.0)
MCV: 106.8 fL — ABNORMAL HIGH (ref 80.0–100.0)
Platelets: 57 10*3/uL — ABNORMAL LOW (ref 150–400)
RBC: 2.34 MIL/uL — ABNORMAL LOW (ref 4.22–5.81)
RDW: 22.5 % — ABNORMAL HIGH (ref 11.5–15.5)
WBC: 5.6 10*3/uL (ref 4.0–10.5)
nRBC: 0 % (ref 0.0–0.2)

## 2020-03-11 LAB — PROTIME-INR
INR: 2.4 — ABNORMAL HIGH (ref 0.8–1.2)
Prothrombin Time: 25.1 seconds — ABNORMAL HIGH (ref 11.4–15.2)

## 2020-03-11 LAB — MAGNESIUM: Magnesium: 2.6 mg/dL — ABNORMAL HIGH (ref 1.7–2.4)

## 2020-03-11 MED ORDER — POTASSIUM PHOSPHATES 15 MMOLE/5ML IV SOLN
20.0000 mmol | Freq: Once | INTRAVENOUS | Status: AC
  Administered 2020-03-11: 20 mmol via INTRAVENOUS
  Filled 2020-03-11: qty 6.67

## 2020-03-11 MED ORDER — MUSCLE RUB 10-15 % EX CREA
TOPICAL_CREAM | CUTANEOUS | Status: DC | PRN
  Filled 2020-03-11: qty 85

## 2020-03-11 MED ORDER — ACETAMINOPHEN 325 MG PO TABS
650.0000 mg | ORAL_TABLET | Freq: Three times a day (TID) | ORAL | Status: DC
  Administered 2020-03-11 – 2020-03-16 (×16): 650 mg via ORAL
  Filled 2020-03-11 (×16): qty 2

## 2020-03-11 MED ORDER — LIDOCAINE 5 % EX PTCH
2.0000 | MEDICATED_PATCH | CUTANEOUS | Status: DC
  Administered 2020-03-11 – 2020-03-16 (×6): 2 via TRANSDERMAL
  Filled 2020-03-11 (×7): qty 2

## 2020-03-11 NOTE — Progress Notes (Signed)
NAME:  [name], MRN:  [id number], DOB:  04-10-1935, LOS: 9 ADMISSION DATE:  03/02/2020, CONSULTATION DATE:  03/03/2020 REFERRING MD:  Dr. Kristopher Oppenheim, CHIEF COMPLAINT:  Cardiorenal syndrome   History of present illness   84 year old male presents to ED on 11/30 with progressive shortness of breath and lower extremity edema with CHF and Severe MR with progressive renal failure. Has been followed by Heart Failure and Evaluated by CTS. Determined not a candidate for MitraClip. High risk for conventional surgery. Recommended aggressive medical management.   On arrival to ED patient is massively volume overloaded with poor response to outpatient diuretics. Crt 3.3. BUN 116. INR 2.8. Nephrology and Heart Failure Consulted. Attempted High dose diuretics overnight. Complicated also by hypotension. 12/1 decision made to transfer to ICU for CRRT.   Goals of care conversation with patient. States he is a DNR/DNI however wants to try dialysis, if it does not work would be amendable to comfort.   Past Medical History  Chronic Combined CHF, Severe Mitral Regurgitation, H/O mechanical aortic valve replacement, A.Fib, CAD s/p CABG, CKD stage IV (baseline Crt 2.5-3), ITP anemia  Significant Hospital Events   11/30 > Admit to Hospital  12/1 > Transfer to ICU for CRRT   Consults:  Heart Failure Nephrology  PCCM  Procedures:  Right IJ Vascath 12/1 >>  Significant Diagnostic Tests:  CXR 12/1 > Cardiac shadow is enlarged but stable. Postsurgical changes are again seen and stable. Aortic calcifications are noted. Mild vascular congestion is noted with mild interstitial edema. No focal infiltrate is seen. No bony abnormality is noted.  Micro Data:  Urine Culture 12/1 >>   Antimicrobials:  Cefepime x 1 dose 12/1   Interim history/subjective:   Remains critically ill in the ICU. On vasopressors and cvvhd   Objective   Blood pressure (!) 116/52, pulse 65, temperature 97.6 F (36.4 C), temperature  source Axillary, resp. rate 17, height 6\' 2"  (1.88 m), weight 105.2 kg, SpO2 95 %.        Intake/Output Summary (Last 24 hours) at 03/11/2020 8657 Last data filed at 03/11/2020 0800 Gross per 24 hour  Intake 1994.02 ml  Output 5977 ml  Net -3982.98 ml   Filed Weights   03/09/20 0432 03/10/20 0500 03/11/20 0434  Weight: 110 kg 106 kg 105.2 kg    Examination: General:elderly male, resting in bed, no distress  HEENT: R IJ HD cath  Neuro: alert oriented, following commands  CV: RRR, s1 s2  PULM:diminshed BL breath sounds  GI: obese soft, nt nd  Extremities: unna boots  Skin: Brusing present   Resolved Hospital Problem list     Assessment & Plan:   Acute on Chronic Heart Failure with severe volume overload and RV Failure with hypotension  Severe MR H/O AVR CAD s/p CABG Cardiogenic shock -Not a candidate for Mitral Clip, not a surgical candidate per CTS. Evaluated at [facility] for percutaneous mitral valve trial however denied  Plan Continue NEPI and Vaso Continue midodrine  Wean as tolerated  cvvhd for volume removal Plans for possible stopping of CVVHD over the weekend. Discussions with palliative care and patient to consider discharge home on Sunday. CVVHD flow rates cut in half today.  Low hemoglobin No sign of bleeding  - continue to observe  Stage IV CKD (baseline Crt 2.5-3.4)  Cardiorenal Syndrome  Plan  - cvvhd per nephro  UTI  Plan Cefazolin x7 days  A.Fib on Coumadin Plan Continue coumadin  Holding BB(-)   COPD  OSA on CPAP at HS  Plan On room air   DM  Plan SSI   ITP Plan Follow CBC   Best practice (evaluated daily)   Diet: Low Salt Diet  DVT prophylaxis: Coumadin as above GI prophylaxis: N/A Glucose control: SSI Mobility: Bedrest  last date of multidisciplinary goals of care discussion: [place] 03/06/2020 documented  Family and staff present: Patient palliative care team Summary of discussion: DNR Follow up goals of care discussion due:  This is been daily ongoing discussion. Code Status: DNR Disposition: ICU  Labs    CBC: Recent Labs  Lab 03/07/20 0431 03/08/20 0250 03/09/20 0307 03/10/20 0222 03/11/20 0445  WBC 5.7 5.9 7.7 6.3 5.6  HGB 7.4* 7.5* 7.3* 6.9* 7.4*  HCT 25.7* 24.7* 25.1* 23.6* 25.0*  MCV 108.9* 107.9* 109.1* 110.8* 106.8*  PLT 65* 70* 71* 61* 57*    Basic Metabolic Panel: Recent Labs  Lab 03/07/20 0431 03/07/20 1634 03/08/20 0250 03/08/20 1630 03/09/20 0307 03/09/20 1632 03/10/20 0222 03/10/20 1631 03/11/20 0445  NA 132*   < > 133*   < > 133* 132* 133* 133* 131*  K 4.4   < > 4.6   < > 4.6 4.9 4.4 4.5 4.1  CL 99   < > 99   < > 99 97* 99 99 98  CO2 27   < > 27   < > 25 25 24 25 24   GLUCOSE 136*   < > 140*   < > 141* 123* 127* 107* 118*  BUN 17   < > 14   < > 17 18 19 22 21   CREATININE 0.92   < > 0.95   < > 0.91 0.99 1.00 1.02 1.19  CALCIUM 8.3*   < > 8.7*   < > 8.7* 8.8* 8.6* 8.5* 8.6*  MG 2.6*  --  2.9*  --  2.6*  --  2.7*  --  2.6*  PHOS 2.1*   < > 1.9*   < > 1.6* 1.8* 1.6* 1.5* 1.5*   < > = values in this interval not displayed.   GFR: Estimated Creatinine Clearance: 61.9 mL/min (by C-G formula based on SCr of 1.19 mg/dL). Recent Labs  Lab 03/08/20 0250 03/09/20 0307 03/10/20 0222 03/11/20 0445  WBC 5.9 7.7 6.3 5.6    Liver Function Tests: Recent Labs  Lab 03/09/20 0307 03/09/20 1632 03/10/20 0222 03/10/20 1631 03/11/20 0445  ALBUMIN 3.0* 3.1* 3.0* 3.1* 2.9*   No results for input(s): LIPASE, AMYLASE in the last 168 hours. No results for input(s): AMMONIA in the last 168 hours.  ABG    Component Value Date/Time   PHART 7.321 (L) 12/30/2008 1303   PCO2ART 48.5 (H) 12/30/2008 1303   PO2ART 296.0 (H) 12/30/2008 1303   HCO3 27.0 04/30/2018 1212   TCO2 28 04/30/2018 1212   ACIDBASEDEF 1.0 12/30/2008 1303   O2SAT 76.0 04/30/2018 1212     Coagulation Profile: Recent Labs  Lab 03/07/20 0431 03/08/20 0250 03/09/20 0307 03/10/20 0222 03/11/20 0445  INR 3.4*  3.2* 2.6* 2.4* 2.4*    Cardiac Enzymes: No results for input(s): CKTOTAL, CKMB, CKMBINDEX, TROPONINI in the last 168 hours.  HbA1C: Hgb A1c MFr Bld  Date/Time Value Ref Range Status  03/25/2019 09:53 AM 5.6 4.6 - 6.5 % Final    Comment:    Glycemic Control Guidelines for People with Diabetes:Non Diabetic:  <6%Goal of Therapy: <7%Additional Action Suggested:  >8%   07/01/2018 09:28 AM 5.7 4.6 - 6.5 % Final  Comment:    Glycemic Control Guidelines for People with Diabetes:Non Diabetic:  <6%Goal of Therapy: <7%Additional Action Suggested:  >8%     CBG: Recent Labs  Lab 03/10/20 0734 03/10/20 1131 03/10/20 1538 03/10/20 2214 03/11/20 0740  GLUCAP 126* 144* 117* 124* 115*     This patient is critically ill with multiple organ system failure; which, requires frequent high complexity decision making, assessment, support, evaluation, and titration of therapies. This was completed through the application of advanced monitoring technologies and extensive interpretation of multiple databases. During this encounter critical care time was devoted to patient care services described in this note for 31 minutes.   Garner Nash, DO [facility] 03/11/2020 8:09 AM

## 2020-03-11 NOTE — TOC Progression Note (Signed)
Transition of Care [facility]) - Progression Note    Patient Details  Name: Savir Blanke MRN: [id number] Date of Birth: [birth year]  Transition of Care [facility]) CM/SW Ste[place]s, RN Phone Number: 03/11/2020, 10:08 AM  Clinical Narrative:     Plan is to go home on Monday with hospice. All elements need to be in place by then, Called Chrislyn from [place], who has been following the patient and they have seen him at home for palliative services. She will look at Dr Girtha Rm bergs note and start the hospice process. [facility] bridge to home Monday. Mrs. Bowns is readying the house for a hospital bed and any other equipment that may be needed. CM will assist [facility] in this transition. Of note there is a question of vasopressors even on the way home to sustain if needed until he gets home. This will have to be planned as it is not done regularly. Will work closely with hospice on overcoming ay barriers to getting patient his wishes on home hospice.   Expected Discharge Plan: Home w Hospice Care Barriers to Discharge: Continued Medical Work up  Expected Discharge Plan and Services Expected Discharge Plan: [place] In-house Referral: Clinical Social Work Discharge Planning Services: CM Consult Post Acute Care Choice: Hospice Living arrangements for the past 2 months: Single Family Home                                       Social Determinants of Health (SDOH) Interventions    Readmission Risk Interventions No flowsheet data found.

## 2020-03-11 NOTE — Progress Notes (Signed)
[facility] Progress Note   84 year old male with a history of AVR, OSA on CPAP, afib, chronic coumadin therapy, CHF (EF 40-45%) severe MR not a candidate for conventional surgery, CKDIV with BL cr around 2.5-3.4, CASHD with CABG in 1994, left CEA, ITP, DM< COPD, and BPH with indwelling catheter who presented with severe volume overload after failing outpatient aggressive diuresis. Patient was started on CRRT and has been requiring pressor support.  Assessment/ Plan:   1. CKDIV: BL Cr 2.5-3.4, Cr on admission 3.29. Patient has a history of nephrosclerosis and cardiorenal syndrome and is followed by Dr. Johnney Ou at clinic. Patient arrived significantly volume overloaded, about 45 lbs up from dry weight of 218.Failed aggressive diuresis at home. 1. Started on CRRT 03/03/20 and required initiation of levophed. 2. Using all 4K/2.5Ca bath: Pre-filter 500 ml/hr, post-filter 300 ml/hr, dialysate 1500 ml/hr 3. Goal uf 50 ml/hr-> neg 24.9 L during this hospitalization. 4. Temporary HD catheter placement12/1/21 by IR 5. Patient is still requiring Levophedand vasopressinto maintain his blood pressure with CRRT but requirements are decreasing now that we decreased UF 100 -> 50. 6. He is nota candidate for IHD given the need for pressor support with CRRT as well ashis advanced age, multiple co-morbidities, and poor functional/nutritional status. 7. Palliative carehas seen patient and plans to transition off of CRRT and wean pressors tohelp transition tohomehospice care. 8. Will continue with CRRT but decreased rate to 44ml/hr and start titrating down on  levophedand vasopressin. Still has e/o  volume onboard but markedly less. D/w spouse bedside and appears goal is to get him home Monday  Transitioning  to home hospice. If that's the case then will continue CRRT through Fri evening or Sat AM and then rinse back as long as his BP cooperates. I would not go back up on the pressors  just to get volume off.  4K baths 500/1500/300 pre/dial/post.   2. Hypokalemia:improved with 4K bath and CRRT. 3. Hypophosphatemia-ReceivedreplacementKphos 4. NYHA class IV CHF with volume overload and hypotension- [place] levophedand vasopressin, wean as able. 5. UTI-s/p Cefazolin x7 days 6. Renal osteodystrophy: Phos 5.5, PTH58 7. Anemia -Patient has history of ITP. Receive feraheme. Hgb today 7.5. 8. CHF -Advanced heart failure following. 9. Mechanical AVR on coumadin goal INR 2.5-3.5 10. 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 COPD 37. DM 14. ITP   Subjective:   Levo  + vasopressin; improving pressor requirements. He feels morecomfortable and has an appetite.   Objective:   BP 95/71 (BP Location: Left Arm)   Pulse 69   Temp 97.6 F (36.4 C) (Axillary)   Resp 16   Ht 6\' 2"  (1.88 m)   Wt 105.2 kg   SpO2 97%   BMI 29.78 kg/m   Intake/Output Summary (Last 24 hours) at 03/11/2020 1046 Last data filed at 03/11/2020 1000 Gross per 24 hour  Intake 1986.4 ml  Output 6142 ml  Net -4155.6 ml   Weight change: -0.8 kg  Physical Exam: General: Elderly male, NAD, critically ill appearing HEENT: Right IJ in place and on CRRT Cardiac: Systolic murmur, mechanical click, regular rate Pulmonary: Decreased breath sounds throughout, clear to auscultation Abdomen: Soft, non-tender, non-distended Extremity: 1+ pitting edema around thigh  Imaging: No results found.  Labs: BMET Recent Labs  Lab 03/08/20 0250 03/08/20 1630 03/09/20 0307 03/09/20 1632 03/10/20 0222 03/10/20 1631 03/11/20 0445  NA 133* 133* 133* 132* 133* 133* 131*  K 4.6 5.0 4.6 4.9 4.4 4.5 4.1  CL  99 100 99 97* 99 99 98  CO2 27 26 25 25 24 25 24   GLUCOSE 140* 145* 141* 123* 127* 107* 118*  BUN 14 15 17 18 19 22 21   CREATININE 0.95 0.87 0.91 0.99 1.00 1.02 1.19  CALCIUM 8.7* 8.6* 8.7* 8.8* 8.6* 8.5* 8.6*  PHOS 1.9* 1.9* 1.6* 1.8* 1.6* 1.5* 1.5*   CBC Recent Labs  Lab 03/08/20 0250  03/09/20 0307 03/10/20 0222 03/11/20 0445  WBC 5.9 7.7 6.3 5.6  HGB 7.5* 7.3* 6.9* 7.4*  HCT 24.7* 25.1* 23.6* 25.0*  MCV 107.9* 109.1* 110.8* 106.8*  PLT 70* 71* 61* 57*    Medications:    . (feeding supplement) PROSource Plus  30 mL Oral BID BM  . allopurinol  100 mg Oral Daily  . B-complex with vitamin C  1 tablet Oral Daily  . Chlorhexidine Gluconate Cloth  6 each Topical Q0600  . ezetimibe  10 mg Oral QHS   And  . simvastatin  20 mg Oral QHS  . feeding supplement  237 mL Oral Q24H  . finasteride  5 mg Oral QHS  . folic acid  6,606 mcg Oral Daily  . insulin aspart  0-15 Units Subcutaneous TID WC  . insulin aspart  0-5 Units Subcutaneous QHS  . levothyroxine  75 mcg Oral QAC breakfast  . mouth rinse  15 mL Mouth Rinse BID  . midodrine  15 mg Oral TID WC  . polyethylene glycol  17 g Oral Daily  . senna-docusate  1 tablet Oral BID  . sodium chloride flush  10-40 mL Intracatheter Q12H  . tamsulosin  0.4 mg Oral QPC supper  . vitamin B-12  1,000 mcg Oral QHS  . warfarin  2.5 mg Oral Once per day on Sun Mon Wed Thu Fri  . [START ON 03/13/2020] warfarin  5 mg Oral Once per day on Tue Sat  . Warfarin - Pharmacist Dosing Inpatient   Does not apply q1600      Otelia Santee, MD 03/11/2020, 10:46 AM

## 2020-03-11 NOTE — Progress Notes (Signed)
Elink notified about Na 131. 03/11/20 6:33 AM

## 2020-03-11 NOTE — Progress Notes (Signed)
Patient ID: [name], male   DOB: 1935/09/06, 84 y.o.   MRN: [id number]     Advanced Heart Failure Rounding Note  PCP-Cardiologist: [name], MD   Subjective:    Remains on CVVH, requiring NE 10 and vasopressin 0.04.  Weight continues to come down, UF at 100 cc/hr net currently.    Comfortable in bed, no complaints.   Objective:   Weight Range: 105.2 kg Body mass index is 29.78 kg/m.   Vital Signs:   Temp:  [97.2 F (36.2 C)-98.8 F (37.1 C)] 97.6 F (36.4 C) (12/09 0741) Pulse Rate:  [55-70] 70 (12/09 0815) Resp:  [11-22] 18 (12/09 0815) BP: (78-126)/(35-84) 91/51 (12/09 0815) SpO2:  [94 %-100 %] 94 % (12/09 0815) Weight:  [105.2 kg] 105.2 kg (12/09 0434) Last BM Date: 03/10/20  Weight change: Filed Weights   03/09/20 0432 03/10/20 0500 03/11/20 0434  Weight: 110 kg 106 kg 105.2 kg    Intake/Output:   Intake/Output Summary (Last 24 hours) at 03/11/2020 0915 Last data filed at 03/11/2020 0800 Gross per 24 hour  Intake 1868.75 ml  Output 5970 ml  Net -4101.25 ml      Physical Exam    General: NAD Neck: JVP 8 cm, no thyromegaly or thyroid nodule.  Lungs: Clear to auscultation bilaterally with normal respiratory effort. CV: Nondisplaced PMI.  Heart irregular S1/S2 with mechanical S2, no S3/S4, 2/6 HSM apex.  1+ edema to knees.  N Abdomen: Soft, nontender, no hepatosplenomegaly, no distention.  Skin: Intact without lesions or rashes.  Neurologic: Alert and oriented x 3.  Psych: Normal affect. Extremities: No clubbing or cyanosis.  HEENT: Normal.    Telemetry   A fib 70s (personally reviewed)  EKG    No new EKG to review   Labs    CBC Recent Labs    03/10/20 0222 03/11/20 0445  WBC 6.3 5.6  HGB 6.9* 7.4*  HCT 23.6* 25.0*  MCV 110.8* 106.8*  PLT 61* 57*   Basic Metabolic Panel Recent Labs    03/10/20 0222 03/10/20 1631 03/11/20 0445  NA 133* 133* 131*  K 4.4 4.5 4.1  CL 99 99 98  CO2 24 25 24   GLUCOSE 127* 107* 118*  BUN  19 22 21   CREATININE 1.00 1.02 1.19  CALCIUM 8.6* 8.5* 8.6*  MG 2.7*  --  2.6*  PHOS 1.6* 1.5* 1.5*   Liver Function Tests Recent Labs    03/10/20 1631 03/11/20 0445  ALBUMIN 3.1* 2.9*   No results for input(s): LIPASE, AMYLASE in the last 72 hours. Cardiac Enzymes No results for input(s): CKTOTAL, CKMB, CKMBINDEX, TROPONINI in the last 72 hours.  BNP: BNP (last 3 results) Recent Labs    05/13/19 1005 12/02/19 0927  BNP 187.2* 169.9*    ProBNP (last 3 results) No results for input(s): PROBNP in the last 8760 hours.   D-Dimer No results for input(s): DDIMER in the last 72 hours. Hemoglobin A1C No results for input(s): HGBA1C in the last 72 hours. Fasting Lipid Panel No results for input(s): CHOL, HDL, LDLCALC, TRIG, CHOLHDL, LDLDIRECT in the last 72 hours. Thyroid Function Tests No results for input(s): TSH, T4TOTAL, T3FREE, THYROIDAB in the last 72 hours.  Invalid input(s): FREET3  Other results:   Imaging    No results found.   Medications:     Scheduled Medications: . (feeding supplement) PROSource Plus  30 mL Oral BID BM  . allopurinol  100 mg Oral Daily  . B-complex with vitamin  C  1 tablet Oral Daily  . Chlorhexidine Gluconate Cloth  6 each Topical Q0600  . ezetimibe  10 mg Oral QHS   And  . simvastatin  20 mg Oral QHS  . feeding supplement  237 mL Oral Q24H  . finasteride  5 mg Oral QHS  . folic acid  0,623 mcg Oral Daily  . insulin aspart  0-15 Units Subcutaneous TID WC  . insulin aspart  0-5 Units Subcutaneous QHS  . levothyroxine  75 mcg Oral QAC breakfast  . mouth rinse  15 mL Mouth Rinse BID  . midodrine  15 mg Oral TID WC  . polyethylene glycol  17 g Oral Daily  . senna-docusate  1 tablet Oral BID  . sodium chloride flush  10-40 mL Intracatheter Q12H  . tamsulosin  0.4 mg Oral QPC supper  . vitamin B-12  1,000 mcg Oral QHS  . warfarin  2.5 mg Oral Once per day on Sun Mon Wed Thu Fri  . [START ON 03/13/2020] warfarin  5 mg Oral  Once per day on Tue Sat  . Warfarin - Pharmacist Dosing Inpatient   Does not apply q1600    Infusions: .  prismasol BGK 4/2.5 500 mL/hr at 03/11/20 0851  .  prismasol BGK 4/2.5 300 mL/hr at 03/11/20 0439  . sodium chloride Stopped (03/10/20 0725)  . norepinephrine (LEVOPHED) Adult infusion 10 mcg/min (03/11/20 0800)  . prismasol BGK 4/2.5 1,500 mL/hr at 03/11/20 0715  . vasopressin 0.04 Units/min (03/11/20 0800)    PRN Medications: acetaminophen **OR** acetaminophen, albuterol, alteplase, bisacodyl, Gerhardt's butt cream, heparin, lidocaine (PF), lidocaine-prilocaine, pentafluoroprop-tetrafluoroeth, sodium chloride, sodium chloride, sodium chloride flush   Assessment/Plan   1. Acute on Chronic Diastolic Heart Failure w/ Prominent RV Failure: Echo in 4/21 with EF 50-55%, mildly decreased RV systolic function, severe rheumatic mitral regurgitation, normal mechanical aortic valve. Severe MR likely plays a significant role in CHF and RV failure. NYHA class Illb-IV. Now admitted w/ massive anasarca and failing high dose outpatient diuretics in the setting of progressive CKD.  Started CRRT 12/1 for fluid removal, requiring pressor support.  Remains on NE 10, vasopressin 0.04.  Weight steadily decreasing with CVVH.  Volume status improved, getting near goal.   - Decrease CVVH UF rate to net 50 cc/hr today.   - With drop in UF rate, start to wean NE and vasopressin. Would aim for SBP 90 and above.  - Continue midodrine 15 mg tid.  - Given pressor support needed for CVVH, do not think that he will tolerate iHD due to RV failure.  I have discussed this with patient and family and palliative care is following.  Plan at this point will be to get fluid off as well as we can with CVVH, then arrange for home with hospice care, current plan is home with hospice on Monday.   2. Stage IV CKD => ESRD: CVVH for fluid removal, as above he is not a long-term HD candidate.  - Back off on UF via CVVH to 50 cc/hr  net today.  3. Chronic Afib: Rate is controlled.  - On warfarin.  4. UTI: UC + for Klebsiella Pneumoniae. Completed cephalexin.   5. Chronic Anemia: IDA and anemia of chronic disease from CKD. Hgb 7.4.  Had Feraheme. transfuse for hgb < 7.0  6. Mechanical Aortic Valve: On coumadin. INR goal 2.5-3.5. - coumadin dosing per pharmacy  7. CAD: s/p CABG. No chest pain.  8. Hypothyroidism: On levothyroxine.  9. Severe MR: TEE  7/19 with severe MR with possible rheumatic MV (do not think MS is significant, mildly elevated mean gradient from high flow with severe MR). Structural heart team evaluated, not a Mitraclip candidate. Seen by Dr. [name], would be high risk for open surgery. He was evaluated at [facility] in [place] for percutaneous mitral valve trials. Unfortunately, he does not qualify for any of the available trials. No option for intervention.   Length of Stay: 9  Loralie Champagne, MD  03/11/2020, 9:15 AM  Advanced Heart Failure Team Pager [phone] (M-F; [place])  Please contact [facility] for night-coverage after hours (4p -7a ) and weekends on [email or web address]

## 2020-03-11 NOTE — Progress Notes (Signed)
Manufacturing engineer [facility])  Received request from [facility] for hospice services at home after discharge.  Chart and pt information under review by [facility] physician.  Hospice eligibility pending at this time.  Hospital liaison spoke with [name], patient's spouse, to initiate education related to hospice philosophy and services and to answer any questions at this time.  [name] requested in person meeting tomorrow at [facility]. DME needs to be addressed at that time.  Per discussion the plan is to discharge home possibly on Monday.   ACC information and contact numbers given to spouse.  Above information shared with Kathyrn Drown Manager.  Please call with any questions or concerns.  Thank you for the opportunity to participate in this pt's care.  Domenic Moras, BSN, RN [employer] [phone] (24h on call)

## 2020-03-11 NOTE — Progress Notes (Addendum)
ANTICOAGULATION CONSULT NOTE   Pharmacy Consult for Warfarin Indication:  Mechanical AVR  Allergies  Allergen Reactions  . Rifampin Rash and Other (See Comments)    May have been caused by Vancomycin or Rifampin (??)  . Vancomycin Rash and Other (See Comments)    May have been caused by Vancomycin or Rifampin (??)    Patient Measurements: Height: 6\' 2"  (188 cm) Weight: 105.2 kg (231 lb 14.8 oz) IBW/kg (Calculated) : 82.2 Heparin Dosing Weight:   Vital Signs: Temp: 96.1 F (35.6 C) (12/09 1112) Temp Source: Axillary (12/09 1112) BP: 95/71 (12/09 1001) Pulse Rate: 69 (12/09 1001)  Labs: Recent Labs    03/09/20 0307 03/09/20 1632 03/10/20 0222 03/10/20 1631 03/11/20 0445  HGB 7.3*  --  6.9*  --  7.4*  HCT 25.1*  --  23.6*  --  25.0*  PLT 71*  --  61*  --  57*  LABPROT 26.9*  --  25.4*  --  25.1*  INR 2.6*  --  2.4*  --  2.4*  CREATININE 0.91   < > 1.00 1.02 1.19   < > = values in this interval not displayed.    Estimated Creatinine Clearance: 61.9 mL/min (by C-G formula based on SCr of 1.19 mg/dL).  Assessment: 53 yom that is being admitted for SOB after fluid overload in the setting of CKD. Patient is on warfarin at home for a mechanical AVR. Prior to admission  regimen is Warfarin 5mg  Sat and Tues and 2.5mg  all other days.   Today, INR remains slightly low at 2.4 but of concern his hemoglobin remains low at 7.4 despite receiving 1 unit of blood on 12/8 and his platelets continue to trend down at 57. No overt bleeding noted. Discussed with Dr. Valeta Harms - hold Warfarin today.   *Pallative Care evaluation by Dr. Hilma Favors -noted to have leg cramping and muscle tightness. Remote history of thigh bleed after muscle tear. No bruising or anything else abnormal noted on exam. Phos low 1.5 - replacement ordered after discussing with Dr. Augustin Coupe.   Goal of Therapy:  INR 2.5-3.5 Monitor platelets by anticoagulation protocol: Yes   Plan:  Hold Warfarin per Dr. Valeta Harms today -  Platelets trending down at 57. Hgb remains low at 7.4 after receiving 1 unit PRBCs on 12/8.  Monitor closely for bleeding. Continue to follow daily INR and plans.   Sloan Leiter, PharmD, BCPS, BCCCP Clinical Pharmacist Please check AMION for all [place] numbers 03/11/2020 11:31 AM

## 2020-03-11 NOTE — Progress Notes (Signed)
Palliative Care Progress Note  Mr. Walle continues to improve slowly. He is sitting up in bed reading his newspaper -pleasant as usual and coping very well with his circumstances. He endorses some pain and muscle spasm/cramping in his left thigh-this is the same thigh that he injured and has intramuscular bleed in 2 years ago. I examined his thigh and leg and there is no evidence on bruising, palpable mass or irregularity other than subcutaneous edema and tight hip flexors and calf muscles. His cramping and pain are not inducible.He describes restless feeling that is uncomfortable. Tylenol helps with the pain but not with the feeling of restless and urge to stretch and move his leg.   Review plan for continued CVVH and Pressors. I have discuss in detail hospice discharge plans with [facility]-including the possibility of transporting home with pressors if unable to wean them off-their medical director is reviewing.   Main concern today is falling platelets and holding his anticoagulation with a mechanical mitral valve.  Recommendations:  1. Leg pain: multifactorial including shifts in electrolytes w/ CVVH, anemia and being confined to the bed. Will schedule Tylenol, add a topical pain reliever and a lidoderm patch to his left thigh. Will avoid anything systemic for now. PharmD working on [employer]. Will monitor for any changes.  2. Patient had questions re: Foley- he has had an indwelling foley for at least 6 weeks- he requested a thigh bag for at home. Advised leaving foley in place.  3. He had questions about activity level and is thinking about QOL- was asking if he could be in his recliner at home in his sunroom- will see how things go and work with hospice to meet those goals.  Will continue to follow closely for any additional needs.  Lane Hacker, DO Palliative Medicine  Time: 35 minutes Greater than 50%  of this time was spent counseling and coordinating care related to  the above assessment and plan.

## 2020-03-11 NOTE — Telephone Encounter (Signed)
[name] with [facility] called and said they received hospice referral and was wondering if Dr. Bennett Scrape would be the attendee.

## 2020-03-11 NOTE — Progress Notes (Signed)
Orthopedic Tech Progress Note Patient Details:  [name] 07/29/1935 [id number] Applied unna boots to patient Ortho Devices Type of Ortho Device: Haematologist Ortho Device/Splint Location: BLE Ortho Device/Splint Interventions: Application,Ordered   Post Interventions Patient Tolerated: Well Instructions Provided: Care of device   [name] 03/11/2020, 6:04 PM

## 2020-03-11 NOTE — Progress Notes (Signed)
Palliative Care Progress Note  I met with [name] and his wife Gloria to discuss his goals of care and the discharge plan home with hospice care. They both have a good understanding if his condition and are open to a discussion about EOL planning. The goal is to pull as much fluid off of him as possible and to wean his pressors so that he can discharge home with hospice services.  His wife is planning for his return home and needs help with equipment and is trying to arrange for help in moving some furniture around so that a hospital bed can be brought in for him. There is also a part of his will that has to be signed and his attorney is coming to the hospital Friday with two witnesses to get that done. Realistically Monday would be a safer discharge day given the complexity of his needs for transport home and planning required. I will discuss his post discharge care and goals with the hospice team-he is followed by [facility] palliative prior to admission.  He remains alert and cognitively strong and is able to express his goals clearly. He isnt having significant pain, and overall he has improved in terms of his dyspnea.He will be bedbound.His wife is his primary caregiver and is abe to provide the majority of his in home care but will need as much support as possible from hospice.  1. Recommend discharge home Monday with hospice care 2. Maximize his volume through pressors and CRRT 3. Question if in order to meet his goal to be at home for EOL if we could use short term IV pressor at home even just to get him there if he remains unable to come off in hospital levophed?  4. Strong family support. 5. Begin getting equipment at home in place. 6. Will discuss comfort meds at discharge.   , DO Palliative Medicine      

## 2020-03-11 NOTE — Progress Notes (Signed)
Nutrition Follow-up  DOCUMENTATION CODES:   Not applicable  INTERVENTION:   - Liberalize diet to Regular, verbal with readback order placed per Dr. [name]  - Continue ProSource Plus 30 ml po BID, each supplement provides 100 kcal and 15 grams of protein  - Continue Ensure Enlive po daily, each supplement provides 350 kcal and 20 grams of protein  - Continue B-complex with vitamin C to account for losses with CRRT  NUTRITION DIAGNOSIS:   Increased nutrient needs related to acute illness (anasarca requiring CRRT for volume removal) as evidenced by estimated needs.  Ongoing  GOAL:   Patient will meet greater than or equal to 90% of their needs  Progressing  MONITOR:   PO intake,Supplement acceptance,Labs,Weight trends,Skin,I & O's  REASON FOR ASSESSMENT:   Rounds    ASSESSMENT:   84 year old male who presented to the ED on 11/30 with increased LE edema. PMH of CKD IV, CHF, DM, COPD, severe mitral regurgitation, mechanical aortic valve replacement, atrial fibrillation, CAD s/p CABG. Pt with anasarca likely from CHF with progressive renal disease.  12/01 - s/p non-tunneled dialysis catheter placement, transferred to ICU, CRRT initiated  Discussed pt with RN and during ICU rounds.  Pt remains on CRRT for volume removal. Rate decreased to net 50 ml/hr today. Per HF Team note, start to wean pressors with plan for pt to d/c home with hospice on Monday.  Discussed diet liberalization with MD who agreed. Verbal with readback order placed for regular diet.  Spoke with pt at bedside. Pt states that he continues to eat well but was unable to get milk on his breakfast tray due to renal diet orders. RD assured pt that he can now order milk if he would like it. He is drinking the Ensure supplements.  Admit weight: 132.7 kg Current weight: 105.2 kg  Pt is net negative 24.8 L since admit. Pt still with mild pitting edema to BLE.  Meal Completion: 80-100%  Medications reviewed  and include: ProSource Plus BID, B-complex with vitamin C, Ensure Enlive daily, folic acid, SSI, miralax, senna, vitamin B-12, warfarin Drips: levophed, vasopressin  Labs reviewed: sodium 131, phosphorus 1.5, magnesium 2.6, hemoglobin 7.4 CBG's: 115-144 x 24 hours  UOP: 145 ml x 24 hours CRRT UF: 6056 ml x 24 hours I/O's: -24.8 L since admit  Diet Order:   Diet Order            Diet regular Room service appropriate? Yes; Fluid consistency: Thin  Diet effective now                 EDUCATION NEEDS:   Education needs have been addressed  Skin:  Skin Assessment: Skin Integrity Issues: Stage I: sacrum Other: venous stasis ulcer right leg, skin tear left shoulder  Last BM:  03/10/20 small type 2  Height:   Ht Readings from Last 1 Encounters:  03/05/20 6\' 2"  (1.88 m)    Weight:   Wt Readings from Last 1 Encounters:  03/11/20 105.2 kg    BMI:  Body mass index is 29.78 kg/m.  Estimated Nutritional Needs:   Kcal:  2751-7001  Protein:  170-190 grams  Fluid:  2.0 L/day    Andrew Bryant, MS, RD, LDN Inpatient Clinical Dietitian Please see AMiON for contact information.

## 2020-03-12 LAB — PROTIME-INR
INR: 2.6 — ABNORMAL HIGH (ref 0.8–1.2)
Prothrombin Time: 27.1 seconds — ABNORMAL HIGH (ref 11.4–15.2)

## 2020-03-12 LAB — RENAL FUNCTION PANEL
Albumin: 2.9 g/dL — ABNORMAL LOW (ref 3.5–5.0)
Albumin: 2.8 g/dL — ABNORMAL LOW (ref 3.5–5.0)
Anion gap: 8 (ref 5–15)
Anion gap: 10 (ref 5–15)
BUN: 20 mg/dL (ref 8–23)
BUN: 20 mg/dL (ref 8–23)
CO2: 25 mmol/L (ref 22–32)
CO2: 25 mmol/L (ref 22–32)
Calcium: 8.4 mg/dL — ABNORMAL LOW (ref 8.9–10.3)
Calcium: 8.3 mg/dL — ABNORMAL LOW (ref 8.9–10.3)
Chloride: 98 mmol/L (ref 98–111)
Chloride: 98 mmol/L (ref 98–111)
Creatinine, Ser: 1.12 mg/dL (ref 0.61–1.24)
Creatinine, Ser: 1.24 mg/dL (ref 0.61–1.24)
GFR, Estimated: >60 mL/min (ref >60)
GFR, Estimated: 58 mL/min — ABNORMAL LOW (ref >60)
Glucose, Bld: 139 mg/dL — ABNORMAL HIGH (ref 70–99)
Glucose, Bld: 134 mg/dL — ABNORMAL HIGH (ref 70–99)
Phosphorus: 2.3 mg/dL — ABNORMAL LOW (ref 2.5–4.6)
Phosphorus: 2.9 mg/dL (ref 2.5–4.6)
Potassium: 4.5 mmol/L (ref 3.5–5.1)
Potassium: 5 mmol/L (ref 3.5–5.1)
Sodium: 131 mmol/L — ABNORMAL LOW (ref 135–145)
Sodium: 133 mmol/L — ABNORMAL LOW (ref 135–145)

## 2020-03-12 LAB — CBC
HCT: 24.4 % — ABNORMAL LOW (ref 39.0–52.0)
Hemoglobin: 7.8 g/dL — ABNORMAL LOW (ref 13.0–17.0)
MCH: 33.2 pg (ref 26.0–34.0)
MCHC: 32 g/dL (ref 30.0–36.0)
MCV: 103.8 fL — ABNORMAL HIGH (ref 80.0–100.0)
Platelets: 65 10*3/uL — ABNORMAL LOW (ref 150–400)
RBC: 2.35 MIL/uL — ABNORMAL LOW (ref 4.22–5.81)
RDW: 22.4 % — ABNORMAL HIGH (ref 11.5–15.5)
WBC: 5.7 10*3/uL (ref 4.0–10.5)
nRBC: 0 % (ref 0.0–0.2)

## 2020-03-12 LAB — GLUCOSE, CAPILLARY
Glucose-Capillary: 133 mg/dL — ABNORMAL HIGH (ref 70–99)
Glucose-Capillary: 135 mg/dL — ABNORMAL HIGH (ref 70–99)
Glucose-Capillary: 127 mg/dL — ABNORMAL HIGH (ref 70–99)
Glucose-Capillary: 130 mg/dL — ABNORMAL HIGH (ref 70–99)

## 2020-03-12 LAB — MAGNESIUM: Magnesium: 2.7 mg/dL — ABNORMAL HIGH (ref 1.7–2.4)

## 2020-03-12 MED ORDER — POTASSIUM PHOSPHATES 15 MMOLE/5ML IV SOLN
20.0000 mmol | Freq: Once | INTRAVENOUS | Status: AC
  Administered 2020-03-12: 20 mmol via INTRAVENOUS
  Filled 2020-03-12: qty 6.67

## 2020-03-12 MED ORDER — ONDANSETRON HCL 4 MG/2ML IJ SOLN
4.0000 mg | Freq: Four times a day (QID) | INTRAMUSCULAR | Status: DC | PRN
  Administered 2020-03-12 – 2020-03-16 (×3): 4 mg via INTRAVENOUS
  Filled 2020-03-12 (×3): qty 2

## 2020-03-12 NOTE — Progress Notes (Signed)
NAME:  [name], MRN:  [id number], DOB:  05/26/1935, LOS: 65 ADMISSION DATE:  03/02/2020, CONSULTATION DATE:  03/03/2020 REFERRING MD:  Dr. Kristopher Oppenheim, CHIEF COMPLAINT:  Cardiorenal syndrome   History of present illness   84 year old male presents to ED on 11/30 with progressive shortness of breath and lower extremity edema with CHF and Severe MR with progressive renal failure. Has been followed by Heart Failure and Evaluated by CTS. Determined not a candidate for MitraClip. High risk for conventional surgery. Recommended aggressive medical management.   On arrival to ED patient is massively volume overloaded with poor response to outpatient diuretics. Crt 3.3. BUN 116. INR 2.8. Nephrology and Heart Failure Consulted. Attempted High dose diuretics overnight. Complicated also by hypotension. 12/1 decision made to transfer to ICU for CRRT.   Goals of care conversation with patient. States he is a DNR/DNI however wants to try dialysis, if it does not work would be amendable to comfort.   Past Medical History  Chronic Combined CHF, Severe Mitral Regurgitation, H/O mechanical aortic valve replacement, A.Fib, CAD s/p CABG, CKD stage IV (baseline Crt 2.5-3), ITP anemia  Significant Hospital Events   11/30 > Admit to Hospital  12/1 > Transfer to ICU for CRRT   Consults:  Heart Failure Nephrology  PCCM  Procedures:  Right IJ Vascath 12/1 >>  Significant Diagnostic Tests:  CXR 12/1 > Cardiac shadow is enlarged but stable. Postsurgical changes are again seen and stable. Aortic calcifications are noted. Mild vascular congestion is noted with mild interstitial edema. No focal infiltrate is seen. No bony abnormality is noted.  Micro Data:  Urine Culture 12/1 > Klebsiella pneumoniae  Antimicrobials:  Cefepime 12/1 - 12/3 Cephalexin 12/3 -12/7  Interim history/subjective:   Remains critically ill in the ICU. On vasopressors and cvvhd   Objective   Blood pressure (!) 104/57, pulse 63,  temperature 98.2 F (36.8 C), temperature source Axillary, resp. rate 16, height 6\' 2"  (1.88 m), weight 105.8 kg, SpO2 96 %.        Intake/Output Summary (Last 24 hours) at 03/12/2020 0733 Last data filed at 03/12/2020 0700 Gross per 24 hour  Intake 2251.08 ml  Output 969 ml  Net 1282.08 ml   Filed Weights   03/10/20 0500 03/11/20 0434 03/12/20 0500  Weight: 106 kg 105.2 kg 105.8 kg    Examination: General:elderly male, resting in bed, no distress, eating breakfast  HEENT: R IJ HD cath  Neuro: alert oriented, following commands  CV: RRR, s1 s2  PULM:diminshed BL breath sounds  GI: obese soft, nt nd  Extremities: unna boots  Skin: Brusing present   Resolved Hospital Problem list     Assessment & Plan:   Acute on Chronic Heart Failure with severe volume overload and RV Failure with hypotension  Severe MR H/O AVR CAD s/p CABG Cardiogenic shock -Not a candidate for Mitral Clip, not a surgical candidate per CTS. Evaluated at [facility] for percutaneous mitral valve trial however denied  Plan Continue NEPI and Vaso Continue midodrine  Wean as tolerated  cvvhd for volume removal Plans for possible stopping of CVVHD over the weekend. Discussions with palliative care and patient to consider discharge home to hospice on Sunday or Monday. CVVHD 4mL/hr  Low hemoglobin No sign of bleeding  - continue to observe - possible effect of CVVHD - last transfusion 03/10/20  Stage IV CKD (baseline Crt 2.5-3.4)  Cardiorenal Syndrome  Plan  - cvvhd per nephro  UTI - Klebsiella pneumoniae Plan Cefazolin x  7 days, completed 12/7  A.Fib on Coumadin Plan Continue coumadin dosing per pharmacy  Holding BB(-)   COPD OSA on CPAP at HS  Plan On room air   DM  Plan SSI   ITP Plan Follow CBC   Best practice (evaluated daily)   Diet: Low Salt Diet  DVT prophylaxis: Coumadin as above GI prophylaxis: N/A Glucose control: SSI Mobility: Bedrest  last date of  multidisciplinary goals of care discussion: Ideal 03/06/2020 documented  Family and staff present: Patient palliative care team Summary of discussion: DNR Follow up goals of care discussion due: This is been daily ongoing discussion. Code Status: DNR Disposition: ICU  Labs    CBC: Recent Labs  Lab 03/07/20 0431 03/08/20 0250 03/09/20 0307 03/10/20 0222 03/11/20 0445  WBC 5.7 5.9 7.7 6.3 5.6  HGB 7.4* 7.5* 7.3* 6.9* 7.4*  HCT 25.7* 24.7* 25.1* 23.6* 25.0*  MCV 108.9* 107.9* 109.1* 110.8* 106.8*  PLT 65* 70* 71* 61* 57*    Basic Metabolic Panel: Recent Labs  Lab 03/08/20 0250 03/08/20 1630 03/09/20 0307 03/09/20 1632 03/10/20 0222 03/10/20 1631 03/11/20 0445 03/11/20 1557 03/12/20 0319  NA 133*   < > 133*   < > 133* 133* 131* 133* 131*  K 4.6   < > 4.6   < > 4.4 4.5 4.1 4.6 4.5  CL 99   < > 99   < > 99 99 98 98 98  CO2 27   < > 25   < > 24 25 24 25 25   GLUCOSE 140*   < > 141*   < > 127* 107* 118* 170* 139*  BUN 14   < > 17   < > 19 22 21 19 20   CREATININE 0.95   < > 0.91   < > 1.00 1.02 1.19 1.20 1.12  CALCIUM 8.7*   < > 8.7*   < > 8.6* 8.5* 8.6* 8.5* 8.4*  MG 2.9*  --  2.6*  --  2.7*  --  2.6*  --  2.7*  PHOS 1.9*   < > 1.6*   < > 1.6* 1.5* 1.5* 2.0* 2.3*   < > = values in this interval not displayed.   GFR: Estimated Creatinine Clearance: 65.9 mL/min (by C-G formula based on SCr of 1.12 mg/dL). Recent Labs  Lab 03/08/20 0250 03/09/20 0307 03/10/20 0222 03/11/20 0445  WBC 5.9 7.7 6.3 5.6    Liver Function Tests: Recent Labs  Lab 03/10/20 0222 03/10/20 1631 03/11/20 0445 03/11/20 1557 03/12/20 0319  ALBUMIN 3.0* 3.1* 2.9* 3.0* 2.9*   No results for input(s): LIPASE, AMYLASE in the last 168 hours. No results for input(s): AMMONIA in the last 168 hours.  ABG    Component Value Date/Time   PHART 7.321 (L) 12/30/2008 1303   PCO2ART 48.5 (H) 12/30/2008 1303   PO2ART 296.0 (H) 12/30/2008 1303   HCO3 27.0 04/30/2018 1212   TCO2 28 04/30/2018 1212    ACIDBASEDEF 1.0 12/30/2008 1303   O2SAT 76.0 04/30/2018 1212     Coagulation Profile: Recent Labs  Lab 03/08/20 0250 03/09/20 0307 03/10/20 0222 03/11/20 0445 03/12/20 0319  INR 3.2* 2.6* 2.4* 2.4* 2.6*    Cardiac Enzymes: No results for input(s): CKTOTAL, CKMB, CKMBINDEX, TROPONINI in the last 168 hours.  HbA1C: Hgb A1c MFr Bld  Date/Time Value Ref Range Status  03/25/2019 09:53 AM 5.6 4.6 - 6.5 % Final    Comment:    Glycemic Control Guidelines for People with Diabetes:Non Diabetic:  <  6%Goal of Therapy: <7%Additional Action Suggested:  >8%   07/01/2018 09:28 AM 5.7 4.6 - 6.5 % Final    Comment:    Glycemic Control Guidelines for People with Diabetes:Non Diabetic:  <6%Goal of Therapy: <7%Additional Action Suggested:  >8%     CBG: Recent Labs  Lab 03/11/20 0740 03/11/20 1110 03/11/20 1705 03/11/20 2322 03/12/20 0722  GLUCAP 115* 144* 176* 147* 133*     This patient is critically ill with multiple organ system failure; which, requires frequent high complexity decision making, assessment, support, evaluation, and titration of therapies. This was completed through the application of advanced monitoring technologies and extensive interpretation of multiple databases. During this encounter critical care time was devoted to patient care services described in this note for 35 minutes.   Freddi Starr, MD [facility] 03/12/2020 7:33 AM

## 2020-03-12 NOTE — Telephone Encounter (Signed)
   Tammy from hospice made aware

## 2020-03-12 NOTE — Progress Notes (Signed)
Received request from [facility] for hospice services at home after discharge.  Chart and pt information under review by [facility] physician.  Hospice eligibility pending at this time.  Met with patient and wife [name] at bedside to educate hospice philosophy and services and to answer any questions at this time.[name] and Darryls concerns are stopping the Coumadin at discharge, concerns on INR blood draws in the home, as well as what medications can be stopped for hospice at home. I discussed with Dr. Hilma Favors about D/C CRRT today or at some point over the weekend as well as weaning the pressers slowly so the patient could go home and be comfortable. They were OK with this as long as BP was maintained and monitored. I also educated on comfort measures again once discharged but the wife and patient wanted to discuss options with MD first.   DME needs have been discussed and the patient will need: Hospital bed with overlay, bedside table, bed pan.Wife has made plans to be at home to accept the DME.I will order this DME today. Furthermore, plan is to discharge home possibly on Monday or Tuesday. Report also exchanged with bedside RN.  Please call with any questions or concerns.  Thank you for the opportunity to participate in this pt's care.  Clementeen Hoof, BSN, [facility] (in [place]) [phone]

## 2020-03-12 NOTE — Progress Notes (Signed)
[facility] Progress Note   84 year old male with a history of AVR, OSA on CPAP, afib, chronic coumadin therapy, CHF (EF 40-45%) severe MR not a candidate for conventional surgery, CKDIV with BL cr around 2.5-3.4, CASHD with CABG in 1994, left CEA, ITP, DM< COPD, and BPH with indwelling catheter who presented with severe volume overload after failing outpatient aggressive diuresis. Patient was started on CRRT and has been requiring pressor support.  Assessment/ Plan:   1. CKDIV: BL Cr 2.5-3.4, Cr on admission 3.29. Patient has a history of nephrosclerosis and cardiorenal syndrome and is followed by Dr. Johnney Ou at clinic. Patient arrived significantly volume overloaded, about 45 lbs up from dry weight of 218.Failed aggressive diuresis at home. 1. Started on CRRT 03/03/20 and required initiation of levophed. 2. Using all 4K/2.5Ca bath: Pre-filter 500 ml/hr, post-filter 300 ml/hr, dialysate 1500 ml/hr 3. Goal uf 50 ml/hr-> neg 24.9 L during this hospitalization. 4. Temporary HD catheter placement12/1/21 by IR 5. Patient is still requiring Levophedand vasopressinto maintain his blood pressure with CRRT but requirements are decreasing now that we decreased UF 100 -> 50. 6. He is nota candidate for IHD given the need for pressor support with CRRT as well ashis advanced age, multiple co-morbidities, and poor functional/nutritional status. 7. Palliative carehas seen patient and plans to transition off of CRRT and wean pressors tohelp transition tohomehospice care. 8. Continue with CRRT, do not restart if filter/circuit clots or expires. If CRRT is still running closer to discharge, then just can rinse back 2. Hypokalemia:improved with 4K bath and CRRT. 3. Hypophosphatemia-ReceivedreplacementKphos 4. NYHA class IV CHF with volume overload and hypotension- [place]on levophedand vasopressin, wean as able. 5. UTI-s/p Cefazolin x7 days 6. Renal osteodystrophy: Phos okay,  ctm 7. Anemia -Patient has history of ITP. Receive feraheme. Hgb today 7.5. 8. CHF -Advanced heart failure following. 9. Mechanical AVR on coumadin goal INR 2.5-3.5 10. 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 COPD 67. DM 14. ITP   Subjective:   Seen on CRRT. On levo and vaso. Running net even   Objective:   BP 100/72   Pulse 60   Temp 97.7 F (36.5 C) (Oral)   Resp 18   Ht 6\' 2"  (1.88 m)   Wt 105.8 kg   SpO2 94%   BMI 29.95 kg/m   Intake/Output Summary (Last 24 hours) at 03/12/2020 1240 Last data filed at 03/12/2020 1200 Gross per 24 hour  Intake 2677.96 ml  Output 461 ml  Net 2216.96 ml   Weight change: 0.6 kg  Physical Exam: General: Elderly male, NAD, critically ill appearing HEENT: Right IJ in place and on CRRT Cardiac: Systolic murmur, mechanical click, regular rate Pulmonary: Decreased breath sounds throughout, clear to auscultation Abdomen: Soft, non-tender, non-distended Extremity: 1+ pitting edema around thigh  Imaging: No results found.  Labs: BMET Recent Labs  Lab 03/09/20 0307 03/09/20 1632 03/10/20 0222 03/10/20 1631 03/11/20 0445 03/11/20 1557 03/12/20 0319  NA 133* 132* 133* 133* 131* 133* 131*  K 4.6 4.9 4.4 4.5 4.1 4.6 4.5  CL 99 97* 99 99 98 98 98  CO2 25 25 24 25 24 25 25   GLUCOSE 141* 123* 127* 107* 118* 170* 139*  BUN 17 18 19 22 21 19 20   CREATININE 0.91 0.99 1.00 1.02 1.19 1.20 1.12  CALCIUM 8.7* 8.8* 8.6* 8.5* 8.6* 8.5* 8.4*  PHOS 1.6* 1.8* 1.6* 1.5* 1.5* 2.0* 2.3*   CBC Recent Labs  Lab 03/09/20 0307 03/10/20 0222 03/11/20  0445 03/12/20 0919  WBC 7.7 6.3 5.6 5.7  HGB 7.3* 6.9* 7.4* 7.8*  HCT 25.1* 23.6* 25.0* 24.4*  MCV 109.1* 110.8* 106.8* 103.8*  PLT 71* 61* 57* 65*    Medications:    . (feeding supplement) PROSource Plus  30 mL Oral BID BM  . acetaminophen  650 mg Oral TID  . allopurinol  100 mg Oral Daily  . B-complex with vitamin C  1 tablet Oral Daily  . Chlorhexidine Gluconate Cloth  6 each  Topical Q0600  . ezetimibe  10 mg Oral QHS   And  . simvastatin  20 mg Oral QHS  . feeding supplement  237 mL Oral Q24H  . finasteride  5 mg Oral QHS  . folic acid  6,431 mcg Oral Daily  . insulin aspart  0-15 Units Subcutaneous TID WC  . insulin aspart  0-5 Units Subcutaneous QHS  . levothyroxine  75 mcg Oral QAC breakfast  . lidocaine  2 patch Transdermal Q24H  . mouth rinse  15 mL Mouth Rinse BID  . midodrine  15 mg Oral TID WC  . polyethylene glycol  17 g Oral Daily  . senna-docusate  1 tablet Oral BID  . sodium chloride flush  10-40 mL Intracatheter Q12H  . tamsulosin  0.4 mg Oral QPC supper  . vitamin B-12  1,000 mcg Oral QHS  . Warfarin - Pharmacist Dosing Inpatient   Does not apply [id number]

## 2020-03-12 NOTE — Telephone Encounter (Signed)
   Will Dr Alain Marion be the attending for hospice care

## 2020-03-12 NOTE — Progress Notes (Signed)
[facility] Progress Note Patient Name: [name] DOB: 1935/05/26 MRN: [id number]   Date of Service  03/12/2020  HPI/Events of Note  Nausea. RN requests Zofran PRN. QTc 4107ms.  eICU Interventions  Zofran 4mg  IV PRN nausea/vomiting ordered.     Intervention Category Minor Interventions: Routine modifications to care plan (e.g. PRN medications for pain, fever)  Marily Lente Anderia Lorenzo 03/12/2020, 8:33 PM

## 2020-03-12 NOTE — Telephone Encounter (Signed)
Yes.  Thank you.

## 2020-03-12 NOTE — Progress Notes (Addendum)
Patient ID: [name], male   DOB: 09/14/1935, 84 y.o.   MRN: [id number]     Advanced Heart Failure Rounding Note  PCP-Cardiologist: [name], MD   Subjective:    Remains on CVVH, now running even.  BP lower overnight, now requiring NE 28 and vasopressin 0.04.  SBP 120s currently.   Comfortable in bed, no complaints.   Objective:   Weight Range: 105.8 kg Body mass index is 29.95 kg/m.   Vital Signs:   Temp:  [96.1 F (35.6 C)-98.2 F (36.8 C)] 98.2 F (36.8 C) (12/10 0725) Pulse Rate:  [55-72] 62 (12/10 0845) Resp:  [14-25] 14 (12/10 0845) BP: (73-125)/(33-96) 121/56 (12/10 0845) SpO2:  [89 %-98 %] 95 % (12/10 0845) Weight:  [105.8 kg] 105.8 kg (12/10 0500) Last BM Date: 03/11/20  Weight change: Filed Weights   03/10/20 0500 03/11/20 0434 03/12/20 0500  Weight: 106 kg 105.2 kg 105.8 kg    Intake/Output:   Intake/Output Summary (Last 24 hours) at 03/12/2020 0900 Last data filed at 03/12/2020 4782 Gross per 24 hour  Intake 2486.22 ml  Output 562 ml  Net 1924.22 ml      Physical Exam    General: NAD, frail Neck: No JVD, no thyromegaly or thyroid nodule.  Lungs: Clear to auscultation bilaterally with normal respiratory effort. CV: Nondisplaced PMI.  Heart regular S1/S2 mechanical S1, no S3/S4, no murmur.  Trace ankle edema.   Abdomen: Soft, nontender, no hepatosplenomegaly, no distention.  Skin: Intact without lesions or rashes.  Neurologic: Alert and oriented x 3.  Psych: Normal affect. Extremities: No clubbing or cyanosis.  HEENT: Normal.    Telemetry   A fib 70s (personally reviewed)  EKG    No new EKG to review   Labs    CBC Recent Labs    03/10/20 0222 03/11/20 0445  WBC 6.3 5.6  HGB 6.9* 7.4*  HCT 23.6* 25.0*  MCV 110.8* 106.8*  PLT 61* 57*   Basic Metabolic Panel Recent Labs    03/11/20 0445 03/11/20 1557 03/12/20 0319  NA 131* 133* 131*  K 4.1 4.6 4.5  CL 98 98 98  CO2 24 25 25   GLUCOSE 118* 170* 139*  BUN 21  19 20   CREATININE 1.19 1.20 1.12  CALCIUM 8.6* 8.5* 8.4*  MG 2.6*  --  2.7*  PHOS 1.5* 2.0* 2.3*   Liver Function Tests Recent Labs    03/11/20 1557 03/12/20 0319  ALBUMIN 3.0* 2.9*   No results for input(s): LIPASE, AMYLASE in the last 72 hours. Cardiac Enzymes No results for input(s): CKTOTAL, CKMB, CKMBINDEX, TROPONINI in the last 72 hours.  BNP: BNP (last 3 results) Recent Labs    05/13/19 1005 12/02/19 0927  BNP 187.2* 169.9*    ProBNP (last 3 results) No results for input(s): PROBNP in the last 8760 hours.   D-Dimer No results for input(s): DDIMER in the last 72 hours. Hemoglobin A1C No results for input(s): HGBA1C in the last 72 hours. Fasting Lipid Panel No results for input(s): CHOL, HDL, LDLCALC, TRIG, CHOLHDL, LDLDIRECT in the last 72 hours. Thyroid Function Tests No results for input(s): TSH, T4TOTAL, T3FREE, THYROIDAB in the last 72 hours.  Invalid input(s): FREET3  Other results:   Imaging    No results found.   Medications:     Scheduled Medications: . (feeding supplement) PROSource Plus  30 mL Oral BID BM  . acetaminophen  650 mg Oral TID  . allopurinol  100 mg Oral Daily  .  B-complex with vitamin C  1 tablet Oral Daily  . Chlorhexidine Gluconate Cloth  6 each Topical Q0600  . ezetimibe  10 mg Oral QHS   And  . simvastatin  20 mg Oral QHS  . feeding supplement  237 mL Oral Q24H  . finasteride  5 mg Oral QHS  . folic acid  5,809 mcg Oral Daily  . insulin aspart  0-15 Units Subcutaneous TID WC  . insulin aspart  0-5 Units Subcutaneous QHS  . levothyroxine  75 mcg Oral QAC breakfast  . lidocaine  2 patch Transdermal Q24H  . mouth rinse  15 mL Mouth Rinse BID  . midodrine  15 mg Oral TID WC  . polyethylene glycol  17 g Oral Daily  . senna-docusate  1 tablet Oral BID  . sodium chloride flush  10-40 mL Intracatheter Q12H  . tamsulosin  0.4 mg Oral QPC supper  . vitamin B-12  1,000 mcg Oral QHS  . Warfarin - Pharmacist Dosing  Inpatient   Does not apply q1600    Infusions: .  prismasol BGK 4/2.5 500 mL/hr at 03/12/20 0525  .  prismasol BGK 4/2.5 300 mL/hr at 03/11/20 2048  . sodium chloride Stopped (03/10/20 0725)  . norepinephrine (LEVOPHED) Adult infusion 28 mcg/min (03/12/20 0800)  . prismasol BGK 4/2.5 1,500 mL/hr at 03/12/20 0736  . vasopressin 0.04 Units/min (03/12/20 0800)    PRN Medications: acetaminophen **OR** acetaminophen, albuterol, alteplase, bisacodyl, Gerhardt's butt cream, heparin, lidocaine (PF), lidocaine-prilocaine, Muscle Rub, pentafluoroprop-tetrafluoroeth, sodium chloride, sodium chloride, sodium chloride flush   Assessment/Plan   1. Acute on Chronic Diastolic Heart Failure w/ Prominent RV Failure: Echo in 4/21 with EF 50-55%, mildly decreased RV systolic function, severe rheumatic mitral regurgitation, normal mechanical aortic valve. Severe MR likely plays a significant role in CHF and RV failure. NYHA class Illb-IV. Now admitted w/ massive anasarca and failing high dose outpatient diuretics in the setting of progressive CKD.  Started CRRT 12/1 for fluid removal, requiring pressor support.  Remains on NE 28, vasopressin 0.04.  NE increased due to low BP overnight, better now.  Volume status looks like it is optimized.  - Run CVVH even at this point, suspect we can stop soon.  - Wean NE and vasopressin for SBP 90 and above.  - Continue midodrine 15 mg tid.  - Given pressor support needed for CVVH, do not think that he will tolerate iHD due to RV failure.  I have discussed this with patient and family and palliative care is following.  Would like to try to get him home with hospice but will need to be off pressors.  2. Stage IV CKD => ESRD: CVVH for fluid removal, as above he is not a long-term HD candidate.  - Run CVVH even at this point, stop soon.  3. Chronic Afib: Rate is controlled.  - On warfarin.  4. UTI: UC + for Klebsiella Pneumoniae. Completed cephalexin.   5. Chronic Anemia:  IDA and anemia of chronic disease from CKD. Hgb 7.4 last check.  Had Feraheme. transfuse for hgb < 7.0  6. Mechanical Aortic Valve: On coumadin. INR goal 2.5-3.5. - coumadin dosing per pharmacy  7. CAD: s/p CABG. No chest pain.  8. Hypothyroidism: On levothyroxine.  9. Severe MR: TEE 7/19 with severe MR with possible rheumatic MV (do not think MS is significant, mildly elevated mean gradient from high flow with severe MR). Structural heart team evaluated, not a Mitraclip candidate. Seen by Dr. [name], would be high risk  for open surgery. He was evaluated at [facility] in [place] for percutaneous mitral valve trials. Unfortunately, he does not qualify for any of the available trials. No option for intervention.  10. Thrombocytopenia: Plts 57K yesterday, slowly falling.  Needs CBC today.   Aiming for home with hospice but will have to get him weaned off pressors.  Will see again on Monday unless called.   Length of Stay: Manning, MD  03/12/2020, 9:00 AM  Advanced Heart Failure Team Pager [phone] (M-F; 7a - 4p)  Please contact [facility] for night-coverage after hours (4p -7a ) and weekends on [email or web address]

## 2020-03-12 NOTE — Progress Notes (Signed)
ANTICOAGULATION CONSULT NOTE   Pharmacy Consult for Warfarin Indication:  Mechanical AVR  Allergies  Allergen Reactions  . Rifampin Rash and Other (See Comments)    May have been caused by Vancomycin or Rifampin (??)  . Vancomycin Rash and Other (See Comments)    May have been caused by Vancomycin or Rifampin (??)    Patient Measurements: Height: 6\' 2"  (188 cm) Weight: 105.8 kg (233 lb 4 oz) IBW/kg (Calculated) : 82.2 Heparin Dosing Weight:   Vital Signs: Temp: 98.2 F (36.8 C) (12/10 0725) Temp Source: Axillary (12/10 0725) BP: 118/53 (12/10 0900) Pulse Rate: 63 (12/10 0900)  Labs: Recent Labs    03/10/20 0222 03/10/20 1631 03/11/20 0445 03/11/20 1557 03/12/20 0319 03/12/20 0919  HGB 6.9*  --  7.4*  --   --  7.8*  HCT 23.6*  --  25.0*  --   --  24.4*  PLT 61*  --  57*  --   --  65*  LABPROT 25.4*  --  25.1*  --  27.1*  --   INR 2.4*  --  2.4*  --  2.6*  --   CREATININE 1.00   < > 1.19 1.20 1.12  --    < > = values in this interval not displayed.    Estimated Creatinine Clearance: 65.9 mL/min (by C-G formula based on SCr of 1.12 mg/dL).  Assessment: 57 yom that is being admitted for SOB after fluid overload in the setting of CKD. Patient is on warfarin at home for a mechanical AVR. Prior to admission  regimen is Warfarin 5mg  Sat and Tues and 2.5mg  all other days.   Today, INR 2.6 but of concern his hemoglobin remains low at 7.8, platelets are up slightly to 65. No overt bleeding noted.   *Pallative Care evaluation by Dr. Hilma Favors -noted to have leg cramping and muscle tightness. Remote history of thigh bleed after muscle tear. No bruising or anything else abnormal noted on exam. Phos low 1.5 - replacement ordered after discussing with Dr. Augustin Coupe.   Goal of Therapy:  INR 2.5-3.5 Monitor platelets by anticoagulation protocol: Yes   Plan:  Will continue to hold Warfarin today.  Monitor closely for bleeding. Continue to follow daily INR and plans.   Alanda Slim,  PharmD, [facility] Clinical Pharmacist Please see AMION for all Pharmacists' Contact Phone Numbers 03/12/2020, 9:43 AM

## 2020-03-13 LAB — RENAL FUNCTION PANEL
Albumin: 2.6 g/dL — ABNORMAL LOW (ref 3.5–5.0)
Albumin: 2.6 g/dL — ABNORMAL LOW (ref 3.5–5.0)
Anion gap: 7 (ref 5–15)
Anion gap: 5 (ref 5–15)
BUN: 19 mg/dL (ref 8–23)
BUN: 26 mg/dL — ABNORMAL HIGH (ref 8–23)
CO2: 28 mmol/L (ref 22–32)
CO2: 27 mmol/L (ref 22–32)
Calcium: 8.3 mg/dL — ABNORMAL LOW (ref 8.9–10.3)
Calcium: 8.1 mg/dL — ABNORMAL LOW (ref 8.9–10.3)
Chloride: 99 mmol/L (ref 98–111)
Chloride: 100 mmol/L (ref 98–111)
Creatinine, Ser: 1.05 mg/dL (ref 0.61–1.24)
Creatinine, Ser: 1.49 mg/dL — ABNORMAL HIGH (ref 0.61–1.24)
GFR, Estimated: >60 mL/min (ref >60)
GFR, Estimated: 47 mL/min — ABNORMAL LOW (ref >60)
Glucose, Bld: 125 mg/dL — ABNORMAL HIGH (ref 70–99)
Glucose, Bld: 110 mg/dL — ABNORMAL HIGH (ref 70–99)
Phosphorus: 2.8 mg/dL (ref 2.5–4.6)
Phosphorus: 3.1 mg/dL (ref 2.5–4.6)
Potassium: 5 mmol/L (ref 3.5–5.1)
Potassium: 5.2 mmol/L — ABNORMAL HIGH (ref 3.5–5.1)
Sodium: 134 mmol/L — ABNORMAL LOW (ref 135–145)
Sodium: 132 mmol/L — ABNORMAL LOW (ref 135–145)

## 2020-03-13 LAB — CBC
HCT: 25.4 % — ABNORMAL LOW (ref 39.0–52.0)
Hemoglobin: 7.7 g/dL — ABNORMAL LOW (ref 13.0–17.0)
MCH: 32.4 pg (ref 26.0–34.0)
MCHC: 30.3 g/dL (ref 30.0–36.0)
MCV: 106.7 fL — ABNORMAL HIGH (ref 80.0–100.0)
Platelets: 61 10*3/uL — ABNORMAL LOW (ref 150–400)
RBC: 2.38 MIL/uL — ABNORMAL LOW (ref 4.22–5.81)
RDW: 22.5 % — ABNORMAL HIGH (ref 11.5–15.5)
WBC: 4.2 10*3/uL (ref 4.0–10.5)
nRBC: 0 % (ref 0.0–0.2)

## 2020-03-13 LAB — PROTIME-INR
INR: 2.3 — ABNORMAL HIGH (ref 0.8–1.2)
Prothrombin Time: 24.7 seconds — ABNORMAL HIGH (ref 11.4–15.2)

## 2020-03-13 LAB — MAGNESIUM: Magnesium: 2.6 mg/dL — ABNORMAL HIGH (ref 1.7–2.4)

## 2020-03-13 LAB — GLUCOSE, CAPILLARY
Glucose-Capillary: 114 mg/dL — ABNORMAL HIGH (ref 70–99)
Glucose-Capillary: 140 mg/dL — ABNORMAL HIGH (ref 70–99)
Glucose-Capillary: 94 mg/dL (ref 70–99)
Glucose-Capillary: 105 mg/dL — ABNORMAL HIGH (ref 70–99)

## 2020-03-13 MED ORDER — WARFARIN SODIUM 5 MG PO TABS
5.0000 mg | ORAL_TABLET | Freq: Once | ORAL | Status: AC
  Administered 2020-03-13: 16:00:00 5 mg via ORAL
  Filled 2020-03-13: qty 1

## 2020-03-13 NOTE — Progress Notes (Signed)
Palliative Care Progress Note  Continuing to follow [name] to support his goals of care and his transition home with hospice care. He is much improved since his admission and after removal of almost 30L during this hospitalization. His levophed is now down to 26mcg. Yesterday concern over his anticoagulation and low platelets were discussed. His coumadin was held. He continues to only require Tylenol for pain and is in general coping well with his condition and prognosis. Hospice ia involved an DME has been ordered and will be delivered this weekend in preparation for dc home.  Discussed with Dr. Erin Fulling. Issues to be addressed:  1. Plan to stop CVVH today and wean pressors to off-hopefully there will not be a need to continue these when he leaves the hospital just for the purpose of getting him home.  2. His coumadin was held due to his platelets- I do have some concerns about anticoagulation regimen when he goes home. Will be difficult to manage coumadin in the home setting with blood draws etc.If cardiology agrees would consider off label use of DOAC in this situation given goals of care.  Plan discussed in detail with ICU attending and with his wife [name] at bedside. DME to be delivered today.  Time 35 min Greater than 50%  of this time was spent counseling and coordinating care related to the above assessment and plan.   Lane Hacker, DO Palliative Medicine

## 2020-03-13 NOTE — Progress Notes (Signed)
ANTICOAGULATION CONSULT NOTE   Pharmacy Consult for Warfarin Indication:  Mechanical AVR  Allergies  Allergen Reactions  . Rifampin Rash and Other (See Comments)    May have been caused by Vancomycin or Rifampin (??)  . Vancomycin Rash and Other (See Comments)    May have been caused by Vancomycin or Rifampin (??)    Patient Measurements: Height: 6\' 2"  (188 cm) Weight: 107.6 kg (237 lb 3.4 oz) IBW/kg (Calculated) : 82.2 Heparin Dosing Weight:   Vital Signs: Temp: 97.5 F (36.4 C) (12/11 0001) Temp Source: Axillary (12/11 0001) BP: 106/51 (12/11 0945) Pulse Rate: 54 (12/11 1000)  Labs: Recent Labs    03/11/20 0445 03/11/20 1557 03/12/20 0319 03/12/20 0919 03/12/20 1544 03/13/20 0244  HGB 7.4*  --   --  7.8*  --  7.7*  HCT 25.0*  --   --  24.4*  --  25.4*  PLT 57*  --   --  65*  --  61*  LABPROT 25.1*  --  27.1*  --   --  24.7*  INR 2.4*  --  2.6*  --   --  2.3*  CREATININE 1.19   < > 1.12  --  1.24 1.05   < > = values in this interval not displayed.    Estimated Creatinine Clearance: 70.9 mL/min (by C-G formula based on SCr of 1.05 mg/dL).  Assessment: 61 yom that is being admitted for SOB after fluid overload in the setting of CKD. Patient is on warfarin at home for a mechanical AVR. Prior to admission  regimen is Warfarin 5mg  Sat and Tues and 2.5mg  all other days.   Today, INR 2.3 hemoglobin remains low but stable at 7.7, platelets are up slightly to 61. No overt bleeding noted.   Goal of Therapy:  INR 2.5-3.5 Monitor platelets by anticoagulation protocol: Yes   Plan:  Warfarin 5 mg po x 1 tonight.  Monitor closely for bleeding. Continue to follow daily INR and plans.   Alanda Slim, PharmD, [facility] Clinical Pharmacist Please see AMION for all Pharmacists' Contact Phone Numbers 03/13/2020, 10:32 AM

## 2020-03-13 NOTE — Progress Notes (Signed)
[facility])  Chart and pt information have been reviewed by [facility] physician.  Hospice eligibility has been confirmed. Discharge planning is ongoing.  [place] liaisons will continue to follow to arrange prompt admission onto hospice services after discharge.  Thank you for the opportunity to participate in this pt's care.  Domenic Moras, BSN, RN [employer] [phone] (24h on call)

## 2020-03-13 NOTE — Progress Notes (Signed)
NAME:  [name], MRN:  [id number], DOB:  22-Aug-1935, LOS: 18 ADMISSION DATE:  03/02/2020, CONSULTATION DATE:  03/03/2020 REFERRING MD:  Dr. Kristopher Oppenheim, CHIEF COMPLAINT:  Cardiorenal syndrome   History of present illness   84 year old male presents to ED on 11/30 with progressive shortness of breath and lower extremity edema with CHF and Severe MR with progressive renal failure. Has been followed by Heart Failure and Evaluated by CTS. Determined not a candidate for MitraClip. High risk for conventional surgery. Recommended aggressive medical management.   On arrival to ED patient is massively volume overloaded with poor response to outpatient diuretics. Crt 3.3. BUN 116. INR 2.8. Nephrology and Heart Failure Consulted. Attempted High dose diuretics overnight. Complicated also by hypotension. 12/1 decision made to transfer to ICU for CRRT.   Goals of care conversation with patient. States he is a DNR/DNI however wants to try dialysis, if it does not work would be amendable to comfort.   Past Medical History  Chronic Combined CHF, Severe Mitral Regurgitation, H/O mechanical aortic valve replacement, A.Fib, CAD s/p CABG, CKD stage IV (baseline Crt 2.5-3), ITP anemia  Significant Hospital Events   11/30 > Admit to Hospital  12/1 > Transfer to ICU for CRRT   Consults:  Heart Failure Nephrology  PCCM  Procedures:  Right IJ Vascath 12/1 >>  Significant Diagnostic Tests:  CXR 12/1 > Cardiac shadow is enlarged but stable. Postsurgical changes are again seen and stable. Aortic calcifications are noted. Mild vascular congestion is noted with mild interstitial edema. No focal infiltrate is seen. No bony abnormality is noted.  Micro Data:  Urine Culture 12/1 > Klebsiella pneumoniae  Antimicrobials:  Cefepime 12/1 - 12/3 Cephalexin 12/3 -12/7  Interim history/subjective:   Remains critically ill in the ICU. On vasopressors and cvvhd  He had episode of nausea and emesis last night. No  further complaints at this time. Discussed case with palliative, goal is to plan for discharge home tomorrow or Monday.  Objective   Blood pressure (!) 94/44, pulse (!) 55, temperature (!) 97.5 F (36.4 C), temperature source Axillary, resp. rate 13, height 6\' 2"  (1.88 m), weight 107.6 kg, SpO2 100 %.        Intake/Output Summary (Last 24 hours) at 03/13/2020 0757 Last data filed at 03/13/2020 0700 Gross per 24 hour  Intake 1924.52 ml  Output 1594 ml  Net 330.52 ml   Filed Weights   03/11/20 0434 03/12/20 0500 03/13/20 0500  Weight: 105.2 kg 105.8 kg 107.6 kg    Examination: General:elderly male, resting in bed, no distress, eating breakfast  HEENT: R IJ HD cath  Neuro: alert oriented, following commands  CV: RRR, s1 s2  PULM:clear to auscultation bilaterally GI: obese soft, nt nd  Extremities: unna boots  Skin: Brusing present   Resolved Hospital Problem list     Assessment & Plan:   Acute on Chronic Heart Failure with severe volume overload and RV Failure with hypotension  Severe MR H/O AVR CAD s/p CABG Cardiogenic shock -Not a candidate for Mitral Clip, not a surgical candidate per CTS. Evaluated at [facility] for percutaneous mitral valve trial however denied  Plan Wean NEPI and Vaso once off CVVHD today Continue midodrine   Low hemoglobin No sign of bleeding  - continue to observe - possible effect of CVVHD - last transfusion 03/10/20  Stage IV CKD (baseline Crt 2.5-3.4)  Cardiorenal Syndrome  Plan  - CVVHD to stop today  UTI - Klebsiella pneumoniae Plan Cefazolin x  7 days, completed 12/7  A.Fib on Coumadin Plan Will plan to transition to eliquis upon discharge home on hospice to ease further monitoring on coumadin. Will check with cardiology about this plan. Holding BB(-)   COPD OSA on CPAP at HS  Plan On room air   DM  Plan SSI   ITP Plan Follow CBC   Best practice (evaluated daily)   Diet: Low Salt Diet  DVT prophylaxis: Coumadin as  above GI prophylaxis: N/A Glucose control: SSI Mobility: Bedrest  last date of multidisciplinary goals of care discussion: [place] 03/06/2020 documented  Family and staff present: Patient palliative care team Summary of discussion: DNR Follow up goals of care discussion due: Plan to discharge home on Monday Code Status: DNR Disposition: ICU  Labs    CBC: Recent Labs  Lab 03/09/20 0307 03/10/20 0222 03/11/20 0445 03/12/20 0919 03/13/20 0244  WBC 7.7 6.3 5.6 5.7 4.2  HGB 7.3* 6.9* 7.4* 7.8* 7.7*  HCT 25.1* 23.6* 25.0* 24.4* 25.4*  MCV 109.1* 110.8* 106.8* 103.8* 106.7*  PLT 71* 61* 57* 65* 61*    Basic Metabolic Panel: Recent Labs  Lab 03/09/20 0307 03/09/20 1632 03/10/20 0222 03/10/20 1631 03/11/20 0445 03/11/20 1557 03/12/20 0319 03/12/20 1544 03/13/20 0244  NA 133*   < > 133*   < > 131* 133* 131* 133* 134*  K 4.6   < > 4.4   < > 4.1 4.6 4.5 5.0 5.0  CL 99   < > 99   < > 98 98 98 98 99  CO2 25   < > 24   < > 24 25 25 25 28   GLUCOSE 141*   < > 127*   < > 118* 170* 139* 134* 125*  BUN 17   < > 19   < > 21 19 20 20 19   CREATININE 0.91   < > 1.00   < > 1.19 1.20 1.12 1.24 1.05  CALCIUM 8.7*   < > 8.6*   < > 8.6* 8.5* 8.4* 8.3* 8.3*  MG 2.6*  --  2.7*  --  2.6*  --  2.7*  --  2.6*  PHOS 1.6*   < > 1.6*   < > 1.5* 2.0* 2.3* 2.9 2.8   < > = values in this interval not displayed.   GFR: Estimated Creatinine Clearance: 70.9 mL/min (by C-G formula based on SCr of 1.05 mg/dL). Recent Labs  Lab 03/10/20 0222 03/11/20 0445 03/12/20 0919 03/13/20 0244  WBC 6.3 5.6 5.7 4.2    Liver Function Tests: Recent Labs  Lab 03/11/20 0445 03/11/20 1557 03/12/20 0319 03/12/20 1544 03/13/20 0244  ALBUMIN 2.9* 3.0* 2.9* 2.8* 2.6*   No results for input(s): LIPASE, AMYLASE in the last 168 hours. No results for input(s): AMMONIA in the last 168 hours.  ABG    Component Value Date/Time   PHART 7.321 (L) 12/30/2008 1303   PCO2ART 48.5 (H) 12/30/2008 1303   PO2ART 296.0 (H)  12/30/2008 1303   HCO3 27.0 04/30/2018 1212   TCO2 28 04/30/2018 1212   ACIDBASEDEF 1.0 12/30/2008 1303   O2SAT 76.0 04/30/2018 1212     Coagulation Profile: Recent Labs  Lab 03/09/20 0307 03/10/20 0222 03/11/20 0445 03/12/20 0319 03/13/20 0244  INR 2.6* 2.4* 2.4* 2.6* 2.3*    Cardiac Enzymes: No results for input(s): CKTOTAL, CKMB, CKMBINDEX, TROPONINI in the last 168 hours.  HbA1C: Hgb A1c MFr Bld  Date/Time Value Ref Range Status  03/25/2019 09:53 AM 5.6 4.6 - 6.5 %  Final    Comment:    Glycemic Control Guidelines for People with Diabetes:Non Diabetic:  <6%Goal of Therapy: <7%Additional Action Suggested:  >8%   07/01/2018 09:28 AM 5.7 4.6 - 6.5 % Final    Comment:    Glycemic Control Guidelines for People with Diabetes:Non Diabetic:  <6%Goal of Therapy: <7%Additional Action Suggested:  >8%     CBG: Recent Labs  Lab 03/12/20 0722 03/12/20 1117 03/12/20 1608 03/12/20 2107 03/13/20 0746  GLUCAP 133* 135* 127* 130* 114*     This patient is critically ill with multiple organ system failure; which, requires frequent high complexity decision making, assessment, support, evaluation, and titration of therapies. This was completed through the application of advanced monitoring technologies and extensive interpretation of multiple databases. During this encounter critical care time was devoted to patient care services described in this note for 35 minutes.   Freddi Starr, MD [facility] 03/13/2020 7:57 AM

## 2020-03-13 NOTE — Progress Notes (Signed)
Stopped CRRT. Plan is to continue to come down on his Levo and hopefully be able to turn off.

## 2020-03-13 NOTE — Progress Notes (Addendum)
[facility] Progress Note   84 year old male with a history of AVR, OSA on CPAP, afib, chronic coumadin therapy, CHF (EF 40-45%) severe MR not a candidate for conventional surgery, CKDIV with BL cr around 2.5-3.4, CASHD with CABG in 1994, left CEA, ITP, DM< COPD, and BPH with indwelling catheter who presented with severe volume overload after failing outpatient aggressive diuresis. Patient was started on CRRT and has been requiring pressor support.  Assessment/ Plan:   1. CKDIV: BL Cr 2.5-3.4, Cr on admission 3.29. Patient has a history of nephrosclerosis and cardiorenal syndrome and is followed by Dr. Johnney Ou at clinic. Patient arrived significantly volume overloaded, about 45 lbs up from dry weight of 218.Failed aggressive diuresis at home. 1. Started on CRRT 03/03/20 and required initiation of levophed. 2. Using all 4K/2.5Ca bath: Pre-filter 500 ml/hr, post-filter 300 ml/hr, dialysate 1500 ml/hr 3. Temporary HD catheter placement12/1/21 by IR 4. Patient is still requiring Levophedand vasopressinto maintain his blood pressure with CRRT but requirements are decreasing now that we decreased UF 100 -> 50. 5. He is nota candidate for IHD given the need for pressor support with CRRT as well ashis advanced age, multiple co-morbidities, and poor functional/nutritional status. 6. Palliative carehas seen patient and plans to transition off of CRRT and wean pressors tohelp transition tohomehospice care. 7. Discussed with primary service. Potential plan for home is tomorrow or Monday, can stop CRRT (rinse back) today to see where his pressor requirements are at. 2. Hypokalemia:improved with 4K bath and CRRT. 3. Hypophosphatemia-ReceivedreplacementKphos 4. NYHA class IV CHF with volume overload and hypotension- [place] levophedand vasopressin, wean as able. 5. UTI-s/p Cefazolin x7 days 6. Renal osteodystrophy: Phos okay, ctm 7. Anemia -Patient has history of ITP.  Receive feraheme. Hgb today 7.5. 8. CHF -Advanced heart failure following. 9. Mechanical AVR on coumadin goal INR 2.5-3.5 10. 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 COPD 56. DM 14. ITP   Subjective:   Seen on CRRT. On levo and vaso. Running net even. No complaints, no acute events.   Objective:   BP (!) 106/51   Pulse (!) 54   Temp (!) 97.5 F (36.4 C) (Axillary)   Resp 13   Ht 6\' 2"  (1.88 m)   Wt 107.6 kg   SpO2 100%   BMI 30.46 kg/m   Intake/Output Summary (Last 24 hours) at 03/13/2020 1005 Last data filed at 03/13/2020 1000 Gross per 24 hour  Intake 2149.09 ml  Output 1523 ml  Net 626.09 ml   Weight change: 1.8 kg  Physical Exam: General: Elderly male, NAD, critically ill appearing HEENT: Right IJ in place and on CRRT Cardiac: Systolic murmur, mechanical click, regular rate Pulmonary: Decreased breath sounds throughout, clear to auscultation Abdomen: Soft, non-tender, non-distended Extremity: pitting edema around thigh Neuro: awake, alert, speech clear and coherent  Imaging: No results found.  Labs: BMET Recent Labs  Lab 03/10/20 0222 03/10/20 1631 03/11/20 0445 03/11/20 1557 03/12/20 0319 03/12/20 1544 03/13/20 0244  NA 133* 133* 131* 133* 131* 133* 134*  K 4.4 4.5 4.1 4.6 4.5 5.0 5.0  CL 99 99 98 98 98 98 99  CO2 24 25 24 25 25 25 28   GLUCOSE 127* 107* 118* 170* 139* 134* 125*  BUN 19 22 21 19 20 20 19   CREATININE 1.00 1.02 1.19 1.20 1.12 1.24 1.05  CALCIUM 8.6* 8.5* 8.6* 8.5* 8.4* 8.3* 8.3*  PHOS 1.6* 1.5* 1.5* 2.0* 2.3* 2.9 2.8   CBC Recent Labs  Lab  03/10/20 0222 03/11/20 0445 03/12/20 0919 03/13/20 0244  WBC 6.3 5.6 5.7 4.2  HGB 6.9* 7.4* 7.8* 7.7*  HCT 23.6* 25.0* 24.4* 25.4*  MCV 110.8* 106.8* 103.8* 106.7*  PLT 61* 57* 65* 61*    Medications:    . (feeding supplement) PROSource Plus  30 mL Oral BID BM  . acetaminophen  650 mg Oral TID  . allopurinol  100 mg Oral Daily  . B-complex with vitamin C  1 tablet Oral  Daily  . Chlorhexidine Gluconate Cloth  6 each Topical Q0600  . ezetimibe  10 mg Oral QHS   And  . simvastatin  20 mg Oral QHS  . feeding supplement  237 mL Oral Q24H  . finasteride  5 mg Oral QHS  . folic acid  8,381 mcg Oral Daily  . insulin aspart  0-15 Units Subcutaneous TID WC  . insulin aspart  0-5 Units Subcutaneous QHS  . levothyroxine  75 mcg Oral QAC breakfast  . lidocaine  2 patch Transdermal Q24H  . mouth rinse  15 mL Mouth Rinse BID  . midodrine  15 mg Oral TID WC  . polyethylene glycol  17 g Oral Daily  . senna-docusate  1 tablet Oral BID  . sodium chloride flush  10-40 mL Intracatheter Q12H  . tamsulosin  0.4 mg Oral QPC supper  . vitamin B-12  1,000 mcg Oral QHS  . Warfarin - Pharmacist Dosing Inpatient   Does not apply [id number]

## 2020-03-14 LAB — RENAL FUNCTION PANEL
Albumin: 2.7 g/dL — ABNORMAL LOW (ref 3.5–5.0)
Albumin: 2.6 g/dL — ABNORMAL LOW (ref 3.5–5.0)
Anion gap: 9 (ref 5–15)
Anion gap: 8 (ref 5–15)
BUN: 36 mg/dL — ABNORMAL HIGH (ref 8–23)
BUN: 45 mg/dL — ABNORMAL HIGH (ref 8–23)
CO2: 24 mmol/L (ref 22–32)
CO2: 23 mmol/L (ref 22–32)
Calcium: 8.3 mg/dL — ABNORMAL LOW (ref 8.9–10.3)
Calcium: 7.8 mg/dL — ABNORMAL LOW (ref 8.9–10.3)
Chloride: 94 mmol/L — ABNORMAL LOW (ref 98–111)
Chloride: 94 mmol/L — ABNORMAL LOW (ref 98–111)
Creatinine, Ser: 1.78 mg/dL — ABNORMAL HIGH (ref 0.61–1.24)
Creatinine, Ser: 1.95 mg/dL — ABNORMAL HIGH (ref 0.61–1.24)
GFR, Estimated: 38 mL/min — ABNORMAL LOW (ref >60)
GFR, Estimated: 34 mL/min — ABNORMAL LOW (ref >60)
Glucose, Bld: 112 mg/dL — ABNORMAL HIGH (ref 70–99)
Glucose, Bld: 140 mg/dL — ABNORMAL HIGH (ref 70–99)
Phosphorus: 3.3 mg/dL (ref 2.5–4.6)
Phosphorus: 4.1 mg/dL (ref 2.5–4.6)
Potassium: 5.5 mmol/L — ABNORMAL HIGH (ref 3.5–5.1)
Potassium: 5 mmol/L (ref 3.5–5.1)
Sodium: 127 mmol/L — ABNORMAL LOW (ref 135–145)
Sodium: 125 mmol/L — ABNORMAL LOW (ref 135–145)

## 2020-03-14 LAB — BASIC METABOLIC PANEL
Anion gap: 9 (ref 5–15)
BUN: 48 mg/dL — ABNORMAL HIGH (ref 8–23)
CO2: 23 mmol/L (ref 22–32)
Calcium: 8.1 mg/dL — ABNORMAL LOW (ref 8.9–10.3)
Chloride: 94 mmol/L — ABNORMAL LOW (ref 98–111)
Creatinine, Ser: 2.19 mg/dL — ABNORMAL HIGH (ref 0.61–1.24)
GFR, Estimated: 29 mL/min — ABNORMAL LOW (ref >60)
Glucose, Bld: 125 mg/dL — ABNORMAL HIGH (ref 70–99)
Potassium: 5.2 mmol/L — ABNORMAL HIGH (ref 3.5–5.1)
Sodium: 126 mmol/L — ABNORMAL LOW (ref 135–145)

## 2020-03-14 LAB — CBC
HCT: 24.3 % — ABNORMAL LOW (ref 39.0–52.0)
Hemoglobin: 7.1 g/dL — ABNORMAL LOW (ref 13.0–17.0)
MCH: 32.3 pg (ref 26.0–34.0)
MCHC: 29.2 g/dL — ABNORMAL LOW (ref 30.0–36.0)
MCV: 110.5 fL — ABNORMAL HIGH (ref 80.0–100.0)
Platelets: 77 10*3/uL — ABNORMAL LOW (ref 150–400)
RBC: 2.2 MIL/uL — ABNORMAL LOW (ref 4.22–5.81)
RDW: 22.4 % — ABNORMAL HIGH (ref 11.5–15.5)
WBC: 5.8 10*3/uL (ref 4.0–10.5)
nRBC: 0 % (ref 0.0–0.2)

## 2020-03-14 LAB — MAGNESIUM
Magnesium: 2.8 mg/dL — ABNORMAL HIGH (ref 1.7–2.4)
Magnesium: 2.8 mg/dL — ABNORMAL HIGH (ref 1.7–2.4)

## 2020-03-14 LAB — GLUCOSE, CAPILLARY
Glucose-Capillary: 116 mg/dL — ABNORMAL HIGH (ref 70–99)
Glucose-Capillary: 132 mg/dL — ABNORMAL HIGH (ref 70–99)
Glucose-Capillary: 122 mg/dL — ABNORMAL HIGH (ref 70–99)
Glucose-Capillary: 112 mg/dL — ABNORMAL HIGH (ref 70–99)

## 2020-03-14 LAB — PROTIME-INR
INR: 2.6 — ABNORMAL HIGH (ref 0.8–1.2)
Prothrombin Time: 27 seconds — ABNORMAL HIGH (ref 11.4–15.2)

## 2020-03-14 MED ORDER — WARFARIN SODIUM 2.5 MG PO TABS
2.5000 mg | ORAL_TABLET | Freq: Once | ORAL | Status: AC
  Administered 2020-03-14: 16:00:00 2.5 mg via ORAL
  Filled 2020-03-14: qty 1

## 2020-03-14 MED ORDER — SODIUM ZIRCONIUM CYCLOSILICATE 5 G PO PACK
10.0000 g | PACK | Freq: Once | ORAL | Status: AC
  Administered 2020-03-14: 13:00:00 10 g via ORAL
  Filled 2020-03-14: qty 2

## 2020-03-14 NOTE — Progress Notes (Signed)
[facility])  Chart and pt information have been reviewed by [facility] physician.  Hospice eligibility has been confirmed. Discharge planning is ongoing with anticipated discharge of Monday or Tuesday. DME ordered and delivered.     [place] liaisons will continue to follow to arrange prompt admission onto hospice services after discharge.  Thank you for the opportunity to participate in this pt's care.  Clementeen Hoof, BSN, [facility] (in [place]) [phone]

## 2020-03-14 NOTE — Progress Notes (Signed)
NAME:  [name], MRN:  [id number], DOB:  02-Dec-1935, LOS: 12 ADMISSION DATE:  03/02/2020, CONSULTATION DATE:  03/03/2020 REFERRING MD:  Dr. Kristopher Oppenheim, CHIEF COMPLAINT:  Cardiorenal syndrome   History of present illness   84 year old male presents to ED on 11/30 with progressive shortness of breath and lower extremity edema with CHF and Severe MR with progressive renal failure. Has been followed by Heart Failure and Evaluated by CTS. Determined not a candidate for MitraClip. High risk for conventional surgery. Recommended aggressive medical management.   On arrival to ED patient is massively volume overloaded with poor response to outpatient diuretics. Crt 3.3. BUN 116. INR 2.8. Nephrology and Heart Failure Consulted. Attempted High dose diuretics overnight. Complicated also by hypotension. 12/1 decision made to transfer to ICU for CRRT.   Goals of care conversation with patient. States he is a DNR/DNI however wants to try dialysis, if it does not work would be amendable to comfort.   Past Medical History  Chronic Combined CHF, Severe Mitral Regurgitation, H/O mechanical aortic valve replacement, A.Fib, CAD s/p CABG, CKD stage IV (baseline Crt 2.5-3), ITP anemia  Significant Hospital Events   11/30 > Admit to Hospital  12/1 > Transfer to ICU for CRRT   Consults:  Heart Failure Nephrology  PCCM  Procedures:  Right IJ Vascath 12/1 >>  Significant Diagnostic Tests:  CXR 12/1 > Cardiac shadow is enlarged but stable. Postsurgical changes are again seen and stable. Aortic calcifications are noted. Mild vascular congestion is noted with mild interstitial edema. No focal infiltrate is seen. No bony abnormality is noted.  Micro Data:  Urine Culture 12/1 > Klebsiella pneumoniae  Antimicrobials:  Cefepime 12/1 - 12/3 Cephalexin 12/3 -12/7  Interim history/subjective:   Remains critically ill in the ICU. On vasopressors. CVVHD stopped yesterday with reduction in vasopressors, but  still requirement remains No acute complaints at this time.  Objective   Blood pressure (!) 92/54, pulse (!) 58, temperature 98.1 F (36.7 C), temperature source Oral, resp. rate 14, height 6\' 2"  (1.88 m), weight 111.6 kg, SpO2 96 %.        Intake/Output Summary (Last 24 hours) at 03/14/2020 1055 Last data filed at 03/14/2020 1025 Gross per 24 hour  Intake 1103.34 ml  Output 290 ml  Net 813.34 ml   Filed Weights   03/12/20 0500 03/13/20 0500 03/14/20 0334  Weight: 105.8 kg 107.6 kg 111.6 kg    Examination: General:elderly male, resting in bed, no distress HEENT: R IJ HD cath  Neuro: alert oriented, following commands  CV: RRR, s1 s2  PULM:clear to auscultation bilaterally GI: obese soft, nt nd  Extremities: unna boots  Skin: Brusing present   Resolved Hospital Problem list     Assessment & Plan:   Acute on Chronic Heart Failure with severe volume overload and RV Failure with hypotension  Severe MR H/O AVR CAD s/p CABG Cardiogenic shock -Not a candidate for Mitral Clip, not a surgical candidate per CTS. Evaluated at [facility] for percutaneous mitral valve trial however denied  Plan Wean NEPI and Vaso once off if able, concerned that he may not be able to be weaned from vasopressors Continue midodrine   Low hemoglobin No sign of bleeding  - continue to observe - possible effect of CVVHD - last transfusion 03/10/20  Stage IV CKD (baseline Crt 2.5-3.4)  Cardiorenal Syndrome  Plan  - CVVHD to stopped 12/11  UTI - Klebsiella pneumoniae Plan Cefazolin x 7 days, completed 12/7  A.Fib on  Coumadin Mechanical aortic valve Plan Continue coumadin per cardiology Holding BB(-)   COPD OSA on CPAP at HS  Plan On room air   DM  Plan SSI   ITP Plan Follow CBC   Best practice (evaluated daily)   Diet: Low Salt Diet  DVT prophylaxis: Coumadin as above GI prophylaxis: N/A Glucose control: SSI Mobility: Bedrest  last date of multidisciplinary goals of care  discussion: [place] 03/06/2020 documented  Family and staff present: Patient palliative care team Summary of discussion: DNR Follow up goals of care discussion due: Plan to discharge home on Monday Code Status: DNR Disposition: ICU  Labs    CBC: Recent Labs  Lab 03/10/20 0222 03/11/20 0445 03/12/20 0919 03/13/20 0244 03/14/20 0331  WBC 6.3 5.6 5.7 4.2 5.8  HGB 6.9* 7.4* 7.8* 7.7* 7.1*  HCT 23.6* 25.0* 24.4* 25.4* 24.3*  MCV 110.8* 106.8* 103.8* 106.7* 110.5*  PLT 61* 57* 65* 61* 77*    Basic Metabolic Panel: Recent Labs  Lab 03/10/20 0222 03/10/20 1631 03/11/20 0445 03/11/20 1557 03/12/20 0319 03/12/20 1544 03/13/20 0244 03/13/20 1618 03/14/20 0331  NA 133*   < > 131*   < > 131* 133* 134* 132* 127*  K 4.4   < > 4.1   < > 4.5 5.0 5.0 5.2* 5.5*  CL 99   < > 98   < > 98 98 99 100 94*  CO2 24   < > 24   < > 25 25 28 27 24   GLUCOSE 127*   < > 118*   < > 139* 134* 125* 110* 112*  BUN 19   < > 21   < > 20 20 19  26* 36*  CREATININE 1.00   < > 1.19   < > 1.12 1.24 1.05 1.49* 1.78*  CALCIUM 8.6*   < > 8.6*   < > 8.4* 8.3* 8.3* 8.1* 8.3*  MG 2.7*  --  2.6*  --  2.7*  --  2.6*  --  2.8*  PHOS 1.6*   < > 1.5*   < > 2.3* 2.9 2.8 3.1 3.3   < > = values in this interval not displayed.   GFR: Estimated Creatinine Clearance: 42.5 mL/min (A) (by C-G formula based on SCr of 1.78 mg/dL (H)). Recent Labs  Lab 03/11/20 0445 03/12/20 0919 03/13/20 0244 03/14/20 0331  WBC 5.6 5.7 4.2 5.8    Liver Function Tests: Recent Labs  Lab 03/12/20 0319 03/12/20 1544 03/13/20 0244 03/13/20 1618 03/14/20 0331  ALBUMIN 2.9* 2.8* 2.6* 2.6* 2.7*   No results for input(s): LIPASE, AMYLASE in the last 168 hours. No results for input(s): AMMONIA in the last 168 hours.  ABG    Component Value Date/Time   PHART 7.321 (L) 12/30/2008 1303   PCO2ART 48.5 (H) 12/30/2008 1303   PO2ART 296.0 (H) 12/30/2008 1303   HCO3 27.0 04/30/2018 1212   TCO2 28 04/30/2018 1212   ACIDBASEDEF 1.0 12/30/2008  1303   O2SAT 76.0 04/30/2018 1212     Coagulation Profile: Recent Labs  Lab 03/10/20 0222 03/11/20 0445 03/12/20 0319 03/13/20 0244 03/14/20 0331  INR 2.4* 2.4* 2.6* 2.3* 2.6*    Cardiac Enzymes: No results for input(s): CKTOTAL, CKMB, CKMBINDEX, TROPONINI in the last 168 hours.  HbA1C: Hgb A1c MFr Bld  Date/Time Value Ref Range Status  03/25/2019 09:53 AM 5.6 4.6 - 6.5 % Final    Comment:    Glycemic Control Guidelines for People with Diabetes:Non Diabetic:  <6%Goal of Therapy: <7%Additional  Action Suggested:  >8%   07/01/2018 09:28 AM 5.7 4.6 - 6.5 % Final    Comment:    Glycemic Control Guidelines for People with Diabetes:Non Diabetic:  <6%Goal of Therapy: <7%Additional Action Suggested:  >8%     CBG: Recent Labs  Lab 03/13/20 0746 03/13/20 1205 03/13/20 1728 03/13/20 2033 03/14/20 0757  GLUCAP 114* 140* 94 105* 116*     This patient is critically ill with multiple organ system failure; which, requires frequent high complexity decision making, assessment, support, evaluation, and titration of therapies. This was completed through the application of advanced monitoring technologies and extensive interpretation of multiple databases. During this encounter critical care time was devoted to patient care services described in this note for 35 minutes.   Freddi Starr, MD [place] [facility] 03/14/2020 10:55 AM

## 2020-03-14 NOTE — Progress Notes (Signed)
ANTICOAGULATION CONSULT NOTE   Pharmacy Consult for Warfarin Indication:  Mechanical AVR  Allergies  Allergen Reactions  . Rifampin Rash and Other (See Comments)    May have been caused by Vancomycin or Rifampin (??)  . Vancomycin Rash and Other (See Comments)    May have been caused by Vancomycin or Rifampin (??)    Patient Measurements: Height: 6\' 2"  (188 cm) Weight: 111.6 kg (246 lb 0.5 oz) IBW/kg (Calculated) : 82.2 Heparin Dosing Weight:   Vital Signs: Temp: 98.1 F (36.7 C) (12/12 0715) Temp Source: Oral (12/12 0715) BP: 92/54 (12/12 0800) Pulse Rate: 58 (12/12 0800)  Labs: Recent Labs     0000 03/12/20 0319 03/12/20 0919 03/12/20 1544 03/13/20 0244 03/13/20 1618 03/14/20 0331  HGB   < >  --  7.8*  --  7.7*  --  7.1*  HCT  --   --  24.4*  --  25.4*  --  24.3*  PLT  --   --  65*  --  61*  --  77*  LABPROT  --  27.1*  --   --  24.7*  --  27.0*  INR  --  2.6*  --   --  2.3*  --  2.6*  CREATININE  --  1.12  --    < > 1.05 1.49* 1.78*   < > = values in this interval not displayed.    Estimated Creatinine Clearance: 42.5 mL/min (A) (by C-G formula based on SCr of 1.78 mg/dL (H)).  Assessment: 50 yom that is being admitted for SOB after fluid overload in the setting of CKD. Patient is on warfarin at home for a mechanical AVR. Prior to admission  regimen is Warfarin 5mg  Sat and Tues and 2.5mg  all other days.   Today, INR 2.6 hemoglobin remains low but stable at 7.1, platelets are up slightly to 77. No overt bleeding noted.   Goal of Therapy:  INR 2.5-3.5 Monitor platelets by anticoagulation protocol: Yes   Plan:  Warfarin 2.5 mg po x 1 tonight.  Monitor closely for bleeding. Continue to follow daily INR and plans.   Alanda Slim, PharmD, [facility] Clinical Pharmacist Please see AMION for all Pharmacists' Contact Phone Numbers 03/14/2020, 8:30 AM

## 2020-03-14 NOTE — Progress Notes (Signed)
[place] Progress Note Patient Name: [name] DOB: 01-18-1936 MRN: [id number]   Date of Service  03/14/2020  HPI/Events of Note  Nursing request for BMP and Mg++ level now.   eICU Interventions  Will order BMP and Mg++ level STAT.     Intervention Category Major Interventions: Electrolyte abnormality - evaluation and management  Elray Dains [place] 03/14/2020, 8:16 PM

## 2020-03-14 NOTE — Progress Notes (Signed)
Junctional rhythm on cardiac monitor when completing beginning of shift rhythm strip.  No discernible P waves with heart rate of 48 with wide QRS.  12 lead performed. First 12 lead read junctional rhythm with incomplete left bundle branch block. [place] notified. Second 12 lead performed following increase in heart rate with discernible P waves. Second 12 lead showed Sinus bradycardia, 1st degree AV block with ST&T wave abnormality. Elink aware. 1st degree block and BBB are not new. STAT labs drawn and sent to lab. Results pending. Patient asymptomatic at time and resting comfortably.

## 2020-03-14 NOTE — Progress Notes (Signed)
[facility] Progress Note   84 year old male with a history of AVR, OSA on CPAP, afib, chronic coumadin therapy, CHF (EF 40-45%) severe MR not a candidate for conventional surgery, CKDIV with BL cr around 2.5-3.4, CASHD with CABG in 1994, left CEA, ITP, DM< COPD, and BPH with indwelling catheter who presented with severe volume overload after failing outpatient aggressive diuresis. Patient was started on CRRT and has been requiring pressor support.  Assessment/ Plan:   1. CKDIV: BL Cr 2.5-3.4, Cr on admission 3.29. Patient has a history of nephrosclerosis and cardiorenal syndrome and is followed by Dr. Johnney Ou at clinic. Patient arrived significantly volume overloaded, about 45 lbs up from dry weight of 218.Failed aggressive diuresis at home. 1. Started on CRRT 03/03/20 and required initiation of pressors in the interim 2. Temporary HD catheter placement12/1/21 by IR 3. Patient is still requiring Levophedand vasopressin 4. He is nota candidate for IHD given the need for pressor support with CRRT as well ashis advanced age, multiple co-morbidities, and poor functional/nutritional status. 5. Transitioning to home hospice likely tomorrow. 6. CRRT rinse back 12/11 2. NYHA class IV CHF with volume overload and hypotension- [place] levophedand vasopressin, wean as able. 3. UTI-s/p Cefazolin x7 days 4. Renal osteodystrophy: Phos okay, ctm 5. Anemia -Patient has history of ITP. Receive feraheme. Hgb today 7.5. 6. CHF -Advanced heart failure following. 7. Mechanical AVR on coumadin goal INR 2.5-3.5 8. Afib 9. CASHD s/p CABG and left CEA 10. COPD 63. DM 12. ITP  Discussed with primary service. Patient transitioning to home hospice likely tomorrow. No further renal replacement therapy. Will sign off from a nephrology perspective. Thank you for allowing [place] to participate in the care of this patient. Please call with any questions/concerns.  Gean Quint, MD [facility]   Subjective:   No complaints, eager to go home. Still requiring pressors.   Objective:   BP (!) 100/57 (BP Location: Right Arm)   Pulse (!) 55   Temp 98.1 F (36.7 C) (Oral)   Resp 18   Ht 6\' 2"  (1.88 m)   Wt 111.6 kg   SpO2 96%   BMI 31.59 kg/m   Intake/Output Summary (Last 24 hours) at 03/14/2020 1218 Last data filed at 03/14/2020 1200 Gross per 24 hour  Intake 1149.81 ml  Output 290 ml  Net 859.81 ml   Weight change: 4 kg  Physical Exam: General: Elderly male, NAD, critically ill appearing HEENT: Right IJ tc c/d/i Cardiac: Systolic murmur, mechanical click, regular rate Pulmonary: Decreased breath sounds throughout, clear to auscultation Abdomen: ND Neuro: awake, alert, speech clear and coherent  Imaging: No results found.  Labs: BMET Recent Labs  Lab 03/11/20 0445 03/11/20 1557 03/12/20 0319 03/12/20 1544 03/13/20 0244 03/13/20 1618 03/14/20 0331  NA 131* 133* 131* 133* 134* 132* 127*  K 4.1 4.6 4.5 5.0 5.0 5.2* 5.5*  CL 98 98 98 98 99 100 94*  CO2 24 25 25 25 28 27 24   GLUCOSE 118* 170* 139* 134* 125* 110* 112*  BUN 21 19 20 20 19  26* 36*  CREATININE 1.19 1.20 1.12 1.24 1.05 1.49* 1.78*  CALCIUM 8.6* 8.5* 8.4* 8.3* 8.3* 8.1* 8.3*  PHOS 1.5* 2.0* 2.3* 2.9 2.8 3.1 3.3   CBC Recent Labs  Lab 03/11/20 0445 03/12/20 0919 03/13/20 0244 03/14/20 0331  WBC 5.6 5.7 4.2 5.8  HGB 7.4* 7.8* 7.7* 7.1*  HCT 25.0* 24.4* 25.4* 24.3*  MCV 106.8* 103.8* 106.7* 110.5*  PLT 57* 65*  61* 77*    Medications:    . (feeding supplement) PROSource Plus  30 mL Oral BID BM  . acetaminophen  650 mg Oral TID  . allopurinol  100 mg Oral Daily  . B-complex with vitamin C  1 tablet Oral Daily  . Chlorhexidine Gluconate Cloth  6 each Topical Q0600  . ezetimibe  10 mg Oral QHS   And  . simvastatin  20 mg Oral QHS  . feeding supplement  237 mL Oral Q24H  . finasteride  5 mg Oral QHS  . folic acid  1,505 mcg Oral Daily  . insulin aspart  0-15  Units Subcutaneous TID WC  . insulin aspart  0-5 Units Subcutaneous QHS  . levothyroxine  75 mcg Oral QAC breakfast  . lidocaine  2 patch Transdermal Q24H  . mouth rinse  15 mL Mouth Rinse BID  . midodrine  15 mg Oral TID WC  . polyethylene glycol  17 g Oral Daily  . senna-docusate  1 tablet Oral BID  . sodium chloride flush  10-40 mL Intracatheter Q12H  . tamsulosin  0.4 mg Oral QPC supper  . vitamin B-12  1,000 mcg Oral QHS  . warfarin  2.5 mg Oral ONCE-1600  . Warfarin - Pharmacist Dosing Inpatient   Does not apply [id number]

## 2020-03-15 LAB — BASIC METABOLIC PANEL
Anion gap: 8 (ref 5–15)
BUN: 57 mg/dL — ABNORMAL HIGH (ref 8–23)
CO2: 23 mmol/L (ref 22–32)
Calcium: 7.6 mg/dL — ABNORMAL LOW (ref 8.9–10.3)
Chloride: 93 mmol/L — ABNORMAL LOW (ref 98–111)
Creatinine, Ser: 2.5 mg/dL — ABNORMAL HIGH (ref 0.61–1.24)
GFR, Estimated: 25 mL/min — ABNORMAL LOW (ref >60)
Glucose, Bld: 133 mg/dL — ABNORMAL HIGH (ref 70–99)
Potassium: 5.4 mmol/L — ABNORMAL HIGH (ref 3.5–5.1)
Sodium: 124 mmol/L — ABNORMAL LOW (ref 135–145)

## 2020-03-15 LAB — RENAL FUNCTION PANEL
Albumin: 2.6 g/dL — ABNORMAL LOW (ref 3.5–5.0)
Anion gap: 10 (ref 5–15)
BUN: 51 mg/dL — ABNORMAL HIGH (ref 8–23)
CO2: 23 mmol/L (ref 22–32)
Calcium: 8.1 mg/dL — ABNORMAL LOW (ref 8.9–10.3)
Chloride: 92 mmol/L — ABNORMAL LOW (ref 98–111)
Creatinine, Ser: 2.26 mg/dL — ABNORMAL HIGH (ref 0.61–1.24)
GFR, Estimated: 28 mL/min — ABNORMAL LOW (ref >60)
Glucose, Bld: 119 mg/dL — ABNORMAL HIGH (ref 70–99)
Phosphorus: 4.8 mg/dL — ABNORMAL HIGH (ref 2.5–4.6)
Potassium: 5.6 mmol/L — ABNORMAL HIGH (ref 3.5–5.1)
Sodium: 125 mmol/L — ABNORMAL LOW (ref 135–145)

## 2020-03-15 LAB — CBC
HCT: 22.9 % — ABNORMAL LOW (ref 39.0–52.0)
Hemoglobin: 7.2 g/dL — ABNORMAL LOW (ref 13.0–17.0)
MCH: 34.1 pg — ABNORMAL HIGH (ref 26.0–34.0)
MCHC: 31.4 g/dL (ref 30.0–36.0)
MCV: 108.5 fL — ABNORMAL HIGH (ref 80.0–100.0)
Platelets: 81 10*3/uL — ABNORMAL LOW (ref 150–400)
RBC: 2.11 MIL/uL — ABNORMAL LOW (ref 4.22–5.81)
RDW: 22.5 % — ABNORMAL HIGH (ref 11.5–15.5)
WBC: 7.3 10*3/uL (ref 4.0–10.5)
nRBC: 0 % (ref 0.0–0.2)

## 2020-03-15 LAB — PROTIME-INR
INR: 2.6 — ABNORMAL HIGH (ref 0.8–1.2)
Prothrombin Time: 27.3 seconds — ABNORMAL HIGH (ref 11.4–15.2)

## 2020-03-15 LAB — GLUCOSE, CAPILLARY
Glucose-Capillary: 103 mg/dL — ABNORMAL HIGH (ref 70–99)
Glucose-Capillary: 114 mg/dL — ABNORMAL HIGH (ref 70–99)
Glucose-Capillary: 123 mg/dL — ABNORMAL HIGH (ref 70–99)
Glucose-Capillary: 121 mg/dL — ABNORMAL HIGH (ref 70–99)
Glucose-Capillary: 124 mg/dL — ABNORMAL HIGH (ref 70–99)

## 2020-03-15 LAB — MAGNESIUM: Magnesium: 2.9 mg/dL — ABNORMAL HIGH (ref 1.7–2.4)

## 2020-03-15 MED ORDER — WARFARIN SODIUM 2.5 MG PO TABS
2.5000 mg | ORAL_TABLET | Freq: Once | ORAL | Status: AC
  Administered 2020-03-15: 16:00:00 2.5 mg via ORAL
  Filled 2020-03-15: qty 1

## 2020-03-15 MED ORDER — DOBUTAMINE IN D5W 4-5 MG/ML-% IV SOLN: 2.5000 ug/kg/min | INTRAVENOUS | Status: DC

## 2020-03-15 MED ORDER — MIDODRINE HCL 5 MG PO TABS
20.0000 mg | ORAL_TABLET | Freq: Three times a day (TID) | ORAL | Status: DC
  Administered 2020-03-15: 16:00:00 20 mg via ORAL
  Administered 2020-03-15: 11:00:00 5 mg via ORAL
  Administered 2020-03-16 (×4): 20 mg via ORAL
  Filled 2020-03-15 (×6): qty 4

## 2020-03-15 MED ORDER — SODIUM ZIRCONIUM CYCLOSILICATE 5 G PO PACK
10.0000 g | PACK | Freq: Once | ORAL | Status: AC
  Administered 2020-03-15: 06:00:00 10 g via ORAL
  Filled 2020-03-15: qty 2

## 2020-03-15 MED ORDER — DOBUTAMINE IN D5W 4-5 MG/ML-% IV SOLN
2.5000 ug/kg/min | INTRAVENOUS | Status: DC
  Administered 2020-03-15: 5 ug/kg/min via INTRAVENOUS
  Administered 2020-03-16: 10 ug/kg/min via INTRAVENOUS
  Filled 2020-03-15 (×2): qty 250

## 2020-03-15 MED ORDER — INFLUENZA VAC A&B SA ADJ QUAD 0.5 ML IM PRSY
0.5000 mL | PREFILLED_SYRINGE | INTRAMUSCULAR | Status: AC
  Administered 2020-03-16: 0.5 mL via INTRAMUSCULAR
  Filled 2020-03-15: qty 0.5

## 2020-03-15 MED ORDER — SODIUM ZIRCONIUM CYCLOSILICATE 5 G PO PACK
10.0000 g | PACK | Freq: Every day | ORAL | Status: DC
  Filled 2020-03-15: qty 2

## 2020-03-15 NOTE — Progress Notes (Signed)
ANTICOAGULATION CONSULT NOTE   Pharmacy Consult for Warfarin Indication:  Mechanical AVR  Allergies  Allergen Reactions  . Rifampin Rash and Other (See Comments)    May have been caused by Vancomycin or Rifampin (??)  . Vancomycin Rash and Other (See Comments)    May have been caused by Vancomycin or Rifampin (??)    Patient Measurements: Height: 6\' 2"  (188 cm) Weight: 112.4 kg (247 lb 12.8 oz) IBW/kg (Calculated) : 82.2 Heparin Dosing Weight:   Vital Signs: BP: 92/61 (12/13 0545) Pulse Rate: 52 (12/13 0545)  Labs: Recent Labs    03/13/20 0244 03/13/20 1618 03/14/20 0331 03/14/20 1550 03/14/20 2004 03/15/20 0230  HGB 7.7*  --  7.1*  --   --  7.2*  HCT 25.4*  --  24.3*  --   --  22.9*  PLT 61*  --  77*  --   --  81*  LABPROT 24.7*  --  27.0*  --   --  27.3*  INR 2.3*  --  2.6*  --   --  2.6*  CREATININE 1.05   < > 1.78* 1.95* 2.19* 2.26*   < > = values in this interval not displayed.    Estimated Creatinine Clearance: 33.6 mL/min (A) (by C-G formula based on SCr of 2.26 mg/dL (H)).  Assessment: 39 yom that is being admitted for SOB after fluid overload in the setting of CKD. Patient is on warfarin at home for a mechanical AVR. Prior to admission  regimen is Warfarin 5mg  Sat and Tues and 2.5mg  all other days.   Today, INR 2.6   Goal of Therapy:  INR 2.5-3.5 Monitor platelets by anticoagulation protocol: Yes   Plan:  Warfarin 2.5 mg po x 1 tonight.  Monitor closely for bleeding. Continue to follow daily INR and plans.   Barth Kirks, PharmD, BCPS, BCCCP Clinical Pharmacist [phone]  Please check AMION for all [place] numbers  03/15/2020 8:30 AM

## 2020-03-15 NOTE — Progress Notes (Signed)
NAME:  [name], MRN:  [id number], DOB:  01-19-1936, LOS: 64 ADMISSION DATE:  03/02/2020, CONSULTATION DATE:  03/03/2020 REFERRING MD:  Dr. Kristopher Oppenheim, CHIEF COMPLAINT:  Cardiorenal syndrome   History of present illness   84 year old male presents to ED on 11/30 with progressive shortness of breath and lower extremity edema with CHF and Severe MR with progressive renal failure. Has been followed by Heart Failure and Evaluated by CTS. Determined not a candidate for MitraClip. High risk for conventional surgery. Recommended aggressive medical management.   On arrival to ED patient is massively volume overloaded with poor response to outpatient diuretics. Crt 3.3. BUN 116. INR 2.8. Nephrology and Heart Failure Consulted. Attempted High dose diuretics overnight. Complicated also by hypotension. 12/1 decision made to transfer to ICU for CRRT.   Goals of care conversation with patient. States he is a DNR/DNI however wants to try dialysis, if it does not work would be amendable to comfort.   Past Medical History  Chronic Combined CHF, Severe Mitral Regurgitation, H/O mechanical aortic valve replacement, A.Fib, CAD s/p CABG, CKD stage IV (baseline Crt 2.5-3), ITP anemia  Significant Hospital Events   11/30 > Admit to Hospital  12/1 > Transfer to ICU for CRRT   Consults:  Heart Failure Nephrology  PCCM  Procedures:  Right IJ Vascath 12/1 >>  Significant Diagnostic Tests:  CXR 12/1 > Cardiac shadow is enlarged but stable. Postsurgical changes are again seen and stable. Aortic calcifications are noted. Mild vascular congestion is noted with mild interstitial edema. No focal infiltrate is seen. No bony abnormality is noted.  Micro Data:  Urine Culture 12/1 > Klebsiella pneumoniae  Antimicrobials:  Cefepime 12/1 - 12/3 Cephalexin 12/3 -12/7  Interim history/subjective:   Remains critically ill in the ICU. On vasopressors. CVVHD stopped yesterday with reduction in vasopressors, but  still requirement remains but decreasing. Working towards hospice d/c. Set SBP goal 80 in effort to minimize or come off pressors.  Objective   Blood pressure (!) 79/52, pulse (!) 52, temperature 98 F (36.7 C), temperature source Axillary, resp. rate 16, height 6\' 2"  (1.88 m), weight 112.4 kg, SpO2 95 %.        Intake/Output Summary (Last 24 hours) at 03/15/2020 1937 Last data filed at 03/15/2020 0900 Gross per 24 hour  Intake 612.15 ml  Output 475 ml  Net 137.15 ml   Filed Weights   03/13/20 0500 03/14/20 0334 03/15/20 0221  Weight: 107.6 kg 111.6 kg 112.4 kg    Examination: General:elderly male, sitting up in bed, no distress HEENT: R IJ HD cath  Neuro: alert oriented, following commands  CV: RRR, s1 s2  PULM:NWOB, on [facility]: obese soft, nt nd    Resolved Hospital Problem list     Assessment & Plan:   Acute on Chronic Heart Failure with severe volume overload and RV Failure with hypotension  Severe MR H/O AVR CAD s/p CABG Cardiogenic shock -Not a candidate for Mitral Clip, not a surgical candidate per CTS. Evaluated at [facility] for percutaneous mitral valve trial however denied  Plan Wean NEPI and Vaso off if able, SBP > 80 as long as mentating Continue midodrine   Low hemoglobin No sign of bleeding  - continue to observe - possible effect of CVVHD - last transfusion 03/10/20  Stage IV CKD (baseline Crt 2.5-3.4) Cardiorenal Syndrome  Plan  - CVVHD to stopped 12/11 - [place]  UTI - Klebsiella pneumoniae Plan Cefazolin x 7 days, completed 12/7  A.Fib on  Coumadin Mechanical aortic valve Plan Continue coumadin per cardiology Holding BB(-)   COPD OSA on CPAP at HS  Plan On room air   DM  Plan SSI   ITP Plan Follow CBC   Best practice (evaluated daily)   Diet: Low Salt Diet  DVT prophylaxis: Coumadin as above GI prophylaxis: N/A Glucose control: SSI Mobility: Bedrest  last date of multidisciplinary goals of care discussion: [place] 03/06/2020  documented  Family and staff present: Patient palliative care team Summary of discussion: DNR Follow up goals of care discussion due: Plan to discharge home on 12/13 under hospice care Code Status: DNR Disposition: ICU  Labs    CBC: Recent Labs  Lab 03/11/20 0445 03/12/20 0919 03/13/20 0244 03/14/20 0331 03/15/20 0230  WBC 5.6 5.7 4.2 5.8 7.3  HGB 7.4* 7.8* 7.7* 7.1* 7.2*  HCT 25.0* 24.4* 25.4* 24.3* 22.9*  MCV 106.8* 103.8* 106.7* 110.5* 108.5*  PLT 57* 65* 61* 77* 81*    Basic Metabolic Panel: Recent Labs  Lab 03/12/20 0319 03/12/20 1544 03/13/20 0244 03/13/20 1618 03/14/20 0331 03/14/20 1550 03/14/20 2004 03/15/20 0230  NA 131*   < > 134* 132* 127* 125* 126* 125*  K 4.5   < > 5.0 5.2* 5.5* 5.0 5.2* 5.6*  CL 98   < > 99 100 94* 94* 94* 92*  CO2 25   < > 28 27 24 23 23 23   GLUCOSE 139*   < > 125* 110* 112* 140* 125* 119*  BUN 20   < > 19 26* 36* 45* 48* 51*  CREATININE 1.12   < > 1.05 1.49* 1.78* 1.95* 2.19* 2.26*  CALCIUM 8.4*   < > 8.3* 8.1* 8.3* 7.8* 8.1* 8.1*  MG 2.7*  --  2.6*  --  2.8*  --  2.8* 2.9*  PHOS 2.3*   < > 2.8 3.1 3.3 4.1  --  4.8*   < > = values in this interval not displayed.   GFR: Estimated Creatinine Clearance: 33.6 mL/min (A) (by C-G formula based on SCr of 2.26 mg/dL (H)). Recent Labs  Lab 03/12/20 0919 03/13/20 0244 03/14/20 0331 03/15/20 0230  WBC 5.7 4.2 5.8 7.3    Liver Function Tests: Recent Labs  Lab 03/13/20 0244 03/13/20 1618 03/14/20 0331 03/14/20 1550 03/15/20 0230  ALBUMIN 2.6* 2.6* 2.7* 2.6* 2.6*   No results for input(s): LIPASE, AMYLASE in the last 168 hours. No results for input(s): AMMONIA in the last 168 hours.  ABG    Component Value Date/Time   PHART 7.321 (L) 12/30/2008 1303   PCO2ART 48.5 (H) 12/30/2008 1303   PO2ART 296.0 (H) 12/30/2008 1303   HCO3 27.0 04/30/2018 1212   TCO2 28 04/30/2018 1212   ACIDBASEDEF 1.0 12/30/2008 1303   O2SAT 76.0 04/30/2018 1212     Coagulation Profile: Recent  Labs  Lab 03/11/20 0445 03/12/20 0319 03/13/20 0244 03/14/20 0331 03/15/20 0230  INR 2.4* 2.6* 2.3* 2.6* 2.6*    Cardiac Enzymes: No results for input(s): CKTOTAL, CKMB, CKMBINDEX, TROPONINI in the last 168 hours.  HbA1C: Hgb A1c MFr Bld  Date/Time Value Ref Range Status  03/25/2019 09:53 AM 5.6 4.6 - 6.5 % Final    Comment:    Glycemic Control Guidelines for People with Diabetes:Non Diabetic:  <6%Goal of Therapy: <7%Additional Action Suggested:  >8%   07/01/2018 09:28 AM 5.7 4.6 - 6.5 % Final    Comment:    Glycemic Control Guidelines for People with Diabetes:Non Diabetic:  <6%Goal of Therapy: <7%Additional Action  Suggested:  >8%     CBG: Recent Labs  Lab 03/14/20 1211 03/14/20 1757 03/14/20 2005 03/15/20 0224 03/15/20 0839  GLUCAP 132* 122* 112* 103* 114*    CRITICAL CARE Performed by: Bonna Gains Yazmeen Woolf   Total critical care time: 30 minutes  Critical care time was exclusive of separately billable procedures and treating other patients.  Critical care was necessary to treat or prevent imminent or life-threatening deterioration.  Critical care was time spent personally by me on the following activities: development of treatment plan with patient and/or surrogate as well as nursing, discussions with consultants, evaluation of patient's response to treatment, examination of patient, obtaining history from patient or surrogate, ordering and performing treatments and interventions, ordering and review of laboratory studies, ordering and review of radiographic studies, pulse oximetry and re-evaluation of patient's condition.

## 2020-03-15 NOTE — Progress Notes (Addendum)
Palliative Care Progress Note-Discharge Planning  [name] is awake alert and continues to look better each time I see him. The goals of this hospitalization have been entirely centered around getting him in the best possible shape medically and symptomatically to be able top go home with hospice and to be able to spend the time he has left around his family and friends-especially with the holidays approaching. They are very realistic about his prognosis and their goals are clear. I have been working closely with [name] hospice team in preparation for his discharge home.   Today an attempt was made to discontinue his levophed completely and his midodrine has been increased.However an hour after this was stopped his BP dropped into systolic 41'Y and he became acutely symptomatic. He is only requiring a very small amount of levophed now at 61mcg with BP in the 60-630 systolic range.  His Midodrine was just increased to 20 today from 15mg .  We discussed in detail events from this morning within the context of his goals of care and needs for symptom management. I had an open and detailed conversations about possible options:  1. Likely will not see benefits from increased midodrine for another 24 hours-he is on such a small dose of levophed we may still be able to discontinue it completely which would be the best outcome.   2.The pressor is being used PRIMARILY to control symptoms related to hypotension and we have demonstrated its effectiveness in doing that during his hospitalization. The only other option for controlling this symptom would be "comfort meds" which would likely cause him to be more sedated and potentially unresponsive. If the pressor works and he maintains his QOL and it controls his symptoms it would be reasonable to ask hospice to cover this-he is not using this in a curative way or in a way that would prolong his life or is being used for any other goal other than to keep him from  experiencing the discomfort of BP drop. I do not believe that the dobutamine will impact his prognosis but it will certainly help him not experience the dizziness and weakness associated with low BP. Will need to place a PICC ir midline in preparation for this and possibly for use of comfort meds if needed by hospice. Will also pull his HD cath. There will be no future HD so ok to put in the PICC/Midline. Will also need to get approval from [name] that they will accommodate this this special case use of home ionotrope short term to meet this patient's goals of care.  3. I provided reassurance that with or without the dobutamine we would not let him suffer and have comfort meds available and ready for his use at any point they are needed. I will send those to their outpatient pharmacy today. I gave him the option to go home today and to stop pressors with oral or sublingual opioid symptom management plan   4. He has had an indwelling foley so no need to continue Proscar which can also cause some hypotension.  5. Patient was extremely hypothyroid on admission low T3 and low T4. Increased Synthroid and added Cytomel to help with his symptoms as well.  6. Need to switch from coumadin to DOAC at discharge- hospice will not be able to monitor and adjust coumadin -he has a mechanical Aortic valve.  Given the above options [name] is willing to spend one more day here to see if pressors can be stopped-  we will also prepare in case they cannot be stopped so that discharge home tomorrow will happen with high degree of certainty.   1. Increase midodrine 20mg  q8- transition levo to dobutamine 2. Place midline picc 3. Remove HD cath 4. Send comfort med scripts to pharmacy 5. Discuss and update [facility] on dc plan. 6. Stopped flomax and proscar- need to minimze any potential med that could lower his BP-continue foley at dc 7. Increased Levothyroxine and added Cytomel 25 mg daily (give dose  now). 8. Start DOAC at d/c  Andrew Hacker, DO Palliative Medicine Time: 11-1140AM and 1140-12:20 Extended time Greater than 50%  of this time was spent counseling and coordinating care related to the above assessment and plan.

## 2020-03-15 NOTE — Progress Notes (Signed)
Patient ID: [name], male   DOB: 04/04/1935, 84 y.o.   MRN: [id number]     Advanced Heart Failure Rounding Note  PCP-Cardiologist: Belva Crome III, MD   Subjective:    CVVH off, remains on NE 6.   Comfortable in bed, no complaints.   Objective:   Weight Range: 112.4 kg Body mass index is 31.82 kg/m.   Vital Signs:   Temp:  [98 F (36.7 C)-98.1 F (36.7 C)] 98.1 F (36.7 C) (12/13 1200) Pulse Rate:  [48-60] 55 (12/13 1200) Resp:  [11-22] 22 (12/13 1200) BP: (75-117)/(42-99) 75/42 (12/13 1200) SpO2:  [91 %-100 %] 96 % (12/13 1200) Weight:  [112.4 kg] 112.4 kg (12/13 0221) Last BM Date: 03/15/20  Weight change: Filed Weights   03/13/20 0500 03/14/20 0334 03/15/20 0221  Weight: 107.6 kg 111.6 kg 112.4 kg    Intake/Output:   Intake/Output Summary (Last 24 hours) at 03/15/2020 1303 Last data filed at 03/15/2020 1200 Gross per 24 hour  Intake 796.85 ml  Output 400 ml  Net 396.85 ml      Physical Exam    General: NAD, frail Neck: No JVD, no thyromegaly or thyroid nodule.  Lungs: Clear to auscultation bilaterally with normal respiratory effort. CV: Nondisplaced PMI.  Heart irregular S1/S2 with mechanical S1, no S3/S4, 3/6 HSM apex.  No peripheral edema.   Abdomen: Soft, nontender, no hepatosplenomegaly, no distention.  Skin: Intact without lesions or rashes.  Neurologic: Alert and oriented x 3.  Psych: Normal affect. Extremities: No clubbing or cyanosis.  HEENT: Normal.    Telemetry   A fib 60s (personally reviewed)  EKG    No new EKG to review   Labs    CBC Recent Labs    03/14/20 0331 03/15/20 0230  WBC 5.8 7.3  HGB 7.1* 7.2*  HCT 24.3* 22.9*  MCV 110.5* 108.5*  PLT 77* 81*   Basic Metabolic Panel Recent Labs    03/14/20 1550 03/14/20 2004 03/15/20 0230  NA 125* 126* 125*  K 5.0 5.2* 5.6*  CL 94* 94* 92*  CO2 23 23 23   GLUCOSE 140* 125* 119*  BUN 45* 48* 51*  CREATININE 1.95* 2.19* 2.26*  CALCIUM 7.8* 8.1* 8.1*  MG  --   2.8* 2.9*  PHOS 4.1  --  4.8*   Liver Function Tests Recent Labs    03/14/20 1550 03/15/20 0230  ALBUMIN 2.6* 2.6*   No results for input(s): LIPASE, AMYLASE in the last 72 hours. Cardiac Enzymes No results for input(s): CKTOTAL, CKMB, CKMBINDEX, TROPONINI in the last 72 hours.  BNP: BNP (last 3 results) Recent Labs    05/13/19 1005 12/02/19 0927  BNP 187.2* 169.9*    ProBNP (last 3 results) No results for input(s): PROBNP in the last 8760 hours.   D-Dimer No results for input(s): DDIMER in the last 72 hours. Hemoglobin A1C No results for input(s): HGBA1C in the last 72 hours. Fasting Lipid Panel No results for input(s): CHOL, HDL, LDLCALC, TRIG, CHOLHDL, LDLDIRECT in the last 72 hours. Thyroid Function Tests No results for input(s): TSH, T4TOTAL, T3FREE, THYROIDAB in the last 72 hours.  Invalid input(s): FREET3  Other results:   Imaging    No results found.   Medications:     Scheduled Medications: . (feeding supplement) PROSource Plus  30 mL Oral BID BM  . acetaminophen  650 mg Oral TID  . allopurinol  100 mg Oral Daily  . B-complex with vitamin C  1 tablet Oral Daily  .  Chlorhexidine Gluconate Cloth  6 each Topical Q0600  . ezetimibe  10 mg Oral QHS   And  . simvastatin  20 mg Oral QHS  . feeding supplement  237 mL Oral Q24H  . finasteride  5 mg Oral QHS  . folic acid  9,678 mcg Oral Daily  . [START ON 03/16/2020] influenza vaccine adjuvanted  0.5 mL Intramuscular Tomorrow-1000  . insulin aspart  0-15 Units Subcutaneous TID WC  . insulin aspart  0-5 Units Subcutaneous QHS  . levothyroxine  75 mcg Oral QAC breakfast  . lidocaine  2 patch Transdermal Q24H  . mouth rinse  15 mL Mouth Rinse BID  . midodrine  20 mg Oral TID WC  . polyethylene glycol  17 g Oral Daily  . senna-docusate  1 tablet Oral BID  . sodium chloride flush  10-40 mL Intracatheter Q12H  . [START ON 03/16/2020] sodium zirconium cyclosilicate  10 g Oral Daily  . tamsulosin  0.4  mg Oral QPC supper  . vitamin B-12  1,000 mcg Oral QHS  . warfarin  2.5 mg Oral ONCE-1600  . Warfarin - Pharmacist Dosing Inpatient   Does not apply q1600    Infusions: . sodium chloride Stopped (03/10/20 0725)  . norepinephrine (LEVOPHED) Adult infusion 5 mcg/min (03/15/20 1200)  . vasopressin Stopped (03/15/20 0830)    PRN Medications: acetaminophen **OR** acetaminophen, albuterol, alteplase, bisacodyl, Gerhardt's butt cream, Muscle Rub, ondansetron (ZOFRAN) IV, pentafluoroprop-tetrafluoroeth, sodium chloride, sodium chloride flush   Assessment/Plan   1. Acute on Chronic Diastolic Heart Failure w/ Prominent RV Failure: Echo in 4/21 with EF 50-55%, mildly decreased RV systolic function, severe rheumatic mitral regurgitation, normal mechanical aortic valve. Severe MR likely plays a significant role in CHF and RV failure. NYHA class Illb-IV. Now admitted w/ massive anasarca and failing high dose outpatient diuretics in the setting of progressive CKD.  Started CRRT 12/1 for fluid removal, requiring pressor support.  Now off CVVH, plan for home with hospice.  Remains on NE 6.  - Continue to wean NE for SBP > 80.  - Continue midodrine 20 mg tid.  - Given pressor support needed for CVVH, do not think that he will tolerate iHD due to RV failure.  I have discussed this with patient and family and palliative care is following.  Would like to try to get him home with hospice but will need to be off pressors.  2. Stage IV CKD => ESRD: CVVH for fluid removal, as above he is not a long-term HD candidate.  Now off CVVH.  3. Chronic Afib: Rate is controlled.  - On warfarin.  4. UTI: UC + for Klebsiella Pneumoniae. Completed cephalexin.   5. Chronic Anemia: IDA and anemia of chronic disease from CKD. Hgb 7.4 last check.  Had Feraheme. transfuse for hgb < 7.0  6. Mechanical Aortic Valve: On coumadin. INR goal 2.5-3.5. - coumadin dosing per pharmacy  7. CAD: s/p CABG. No chest pain.  8.  Hypothyroidism: On levothyroxine.  9. Severe MR: TEE 7/19 with severe MR with possible rheumatic MV (do not think MS is significant, mildly elevated mean gradient from high flow with severe MR). Structural heart team evaluated, not a Mitraclip candidate. Seen by Dr. [name], would be high risk for open surgery. He was evaluated at [facility] in [place] for percutaneous mitral valve trials. Unfortunately, he does not qualify for any of the available trials. No option for intervention.  10. Thrombocytopenia: Mildly low plts.   Plan for home with hospice but  will have to get him weaned off pressors.  Will see as needed/if called in the future.   Length of Stay: 71  Loralie Champagne, MD  03/15/2020, 1:03 PM  Advanced Heart Failure Team Pager [phone] (M-F; 7a - 4p)  Please contact [facility] for night-coverage after hours (4p -7a ) and weekends on [email or web address]

## 2020-03-15 NOTE — Progress Notes (Signed)
[facility] Progress Note Patient Name: [name] DOB: [birth year] MRN: [id number]   Date of Service  03/15/2020  HPI/Events of Note  Hyperkalemia - K+ = 5.6.   eICU Interventions  Plan: 1. Lokelma 10 gm PO now. 2. Repeat BMP at 1 PM.      Intervention Category Major Interventions: Electrolyte abnormality - evaluation and management  [name] 03/15/2020, 5:02 AM

## 2020-03-15 NOTE — Progress Notes (Signed)
Manufacturing engineer (ACC)  Visited with pt at bedside to discuss transitioning home with hospice support.  Pt verbalized goal of living until Christmas.  Education provided about hospice support in the home; attempted to discuss goals of care.  Multiple discussions with Dr. Hilma Favors and with [facility] medical director Dr. Konrad Dolores regarding use of dobutamine infusion at home to support blood pressure.  Per Dr. Konrad Dolores this medication is not a part of our hospice plan of care.  Dr. Hilma Favors made aware.  ACC will continue to work with Mr. Colpitts and his family to determine a path forward that best meets his needs.    Thank you for the opportunity to participate in this pt's care.  Domenic Moras, BSN, RN [facility] [phone] (24h on call)

## 2020-03-16 ENCOUNTER — Inpatient Hospital Stay: Payer: Self-pay

## 2020-03-16 ENCOUNTER — Inpatient Hospital Stay (HOSPITAL_COMMUNITY): Payer: Medicare Other

## 2020-03-16 DIAGNOSIS — Z66 Do not resuscitate: Secondary | ICD-10-CM | POA: Diagnosis present

## 2020-03-16 DIAGNOSIS — K767 Hepatorenal syndrome: Secondary | ICD-10-CM

## 2020-03-16 LAB — CBC
HCT: 19.5 % — ABNORMAL LOW (ref 39.0–52.0)
Hemoglobin: 6.2 g/dL — CL (ref 13.0–17.0)
MCH: 34.1 pg — ABNORMAL HIGH (ref 26.0–34.0)
MCHC: 31.8 g/dL (ref 30.0–36.0)
MCV: 107.1 fL — ABNORMAL HIGH (ref 80.0–100.0)
Platelets: 78 10*3/uL — ABNORMAL LOW (ref 150–400)
RBC: 1.82 MIL/uL — ABNORMAL LOW (ref 4.22–5.81)
RDW: 22.5 % — ABNORMAL HIGH (ref 11.5–15.5)
WBC: 5 10*3/uL (ref 4.0–10.5)
nRBC: 0 % (ref 0.0–0.2)

## 2020-03-16 LAB — RENAL FUNCTION PANEL
Albumin: 2.4 g/dL — ABNORMAL LOW (ref 3.5–5.0)
Anion gap: 8 (ref 5–15)
BUN: 66 mg/dL — ABNORMAL HIGH (ref 8–23)
CO2: 23 mmol/L (ref 22–32)
Calcium: 8 mg/dL — ABNORMAL LOW (ref 8.9–10.3)
Chloride: 92 mmol/L — ABNORMAL LOW (ref 98–111)
Creatinine, Ser: 2.67 mg/dL — ABNORMAL HIGH (ref 0.61–1.24)
GFR, Estimated: 23 mL/min — ABNORMAL LOW (ref >60)
Glucose, Bld: 96 mg/dL (ref 70–99)
Phosphorus: 5.4 mg/dL — ABNORMAL HIGH (ref 2.5–4.6)
Potassium: 5.8 mmol/L — ABNORMAL HIGH (ref 3.5–5.1)
Sodium: 123 mmol/L — ABNORMAL LOW (ref 135–145)

## 2020-03-16 LAB — GLUCOSE, CAPILLARY
Glucose-Capillary: 126 mg/dL — ABNORMAL HIGH (ref 70–99)
Glucose-Capillary: 92 mg/dL (ref 70–99)
Glucose-Capillary: 110 mg/dL — ABNORMAL HIGH (ref 70–99)

## 2020-03-16 LAB — PREPARE RBC (CROSSMATCH)

## 2020-03-16 LAB — PROTIME-INR
INR: 2.8 — ABNORMAL HIGH (ref 0.8–1.2)
Prothrombin Time: 28.6 seconds — ABNORMAL HIGH (ref 11.4–15.2)

## 2020-03-16 LAB — MAGNESIUM: Magnesium: 2.7 mg/dL — ABNORMAL HIGH (ref 1.7–2.4)

## 2020-03-16 MED ORDER — LIOTHYRONINE SODIUM 25 MCG PO TABS: 25.0000 ug | ORAL_TABLET | Freq: Every day | ORAL | 1 refills | Status: AC

## 2020-03-16 MED ORDER — DOBUTAMINE IN D5W 4-5 MG/ML-% IV SOLN
2.5000 ug/kg/min | INTRAVENOUS | Status: DC
  Filled 2020-03-16: qty 250

## 2020-03-16 MED ORDER — LEVOTHYROXINE SODIUM 50 MCG PO TABS: 100.0000 ug | ORAL_TABLET | Freq: Every day | ORAL | 3 refills | Status: AC

## 2020-03-16 MED ORDER — SODIUM CHLORIDE 0.9% FLUSH: 10.0000 mL | INTRAVENOUS | Status: DC | PRN

## 2020-03-16 MED ORDER — SALINE SPRAY 0.65 % NA SOLN: 2.0000 | NASAL | 3 refills | Status: AC | PRN

## 2020-03-16 MED ORDER — WARFARIN SODIUM 2.5 MG PO TABS
2.5000 mg | ORAL_TABLET | Freq: Once | ORAL | Status: AC
  Administered 2020-03-16: 17:00:00 2.5 mg via ORAL
  Filled 2020-03-16 (×2): qty 1

## 2020-03-16 MED ORDER — LIOTHYRONINE SODIUM 25 MCG PO TABS
25.0000 ug | ORAL_TABLET | Freq: Every day | ORAL | Status: DC
  Administered 2020-03-16: 10:00:00 25 ug via ORAL
  Filled 2020-03-16: qty 1

## 2020-03-16 MED ORDER — LIDOCAINE 5 % EX PTCH: 2.0000 | MEDICATED_PATCH | CUTANEOUS | 0 refills | Status: AC

## 2020-03-16 MED ORDER — SODIUM ZIRCONIUM CYCLOSILICATE 5 G PO PACK
10.0000 g | PACK | Freq: Two times a day (BID) | ORAL | Status: DC
  Administered 2020-03-16 (×2): 10 g via ORAL
  Filled 2020-03-16: qty 2

## 2020-03-16 MED ORDER — ONDANSETRON 8 MG PO TBDP: 8.0000 mg | ORAL_TABLET | Freq: Three times a day (TID) | ORAL | 3 refills | Status: AC | PRN

## 2020-03-16 MED ORDER — SENNOSIDES-DOCUSATE SODIUM 8.6-50 MG PO TABS: 2.0000 | ORAL_TABLET | Freq: Every day | ORAL | 1 refills | Status: AC

## 2020-03-16 MED ORDER — LEVOTHYROXINE SODIUM 100 MCG PO TABS: 100.0000 ug | ORAL_TABLET | Freq: Every day | ORAL | Status: DC

## 2020-03-16 MED ORDER — LOKELMA 10 G PO PACK: 1.0000 | PACK | Freq: Two times a day (BID) | ORAL | 2 refills | Status: AC

## 2020-03-16 MED ORDER — SODIUM CHLORIDE 0.9% FLUSH
10.0000 mL | Freq: Two times a day (BID) | INTRAVENOUS | Status: DC
  Administered 2020-03-16: 14:00:00 10 mL

## 2020-03-16 MED ORDER — SODIUM CHLORIDE 0.9% IV SOLUTION
Freq: Once | INTRAVENOUS | Status: AC
  Administered 2020-03-16: 13:00:00 10 mL/h via INTRAVENOUS

## 2020-03-16 MED ORDER — DOBUTAMINE IN D5W 4-5 MG/ML-% IV SOLN: INTRAVENOUS | 3 refills | Status: AC

## 2020-03-16 MED ORDER — MIDODRINE HCL 10 MG PO TABS: 20.0000 mg | ORAL_TABLET | Freq: Three times a day (TID) | ORAL | 1 refills | Status: DC

## 2020-03-16 MED ORDER — HALOPERIDOL LACTATE 2 MG/ML PO CONC
2.0000 mg | Freq: Four times a day (QID) | ORAL | 0 refills | Status: AC | PRN
Start: 2020-03-16 — End: ?

## 2020-03-16 MED ORDER — MORPHINE SULFATE (CONCENTRATE) 10 MG /0.5 ML PO SOLN: 20.0000 mg | ORAL | 0 refills | Status: AC | PRN

## 2020-03-16 NOTE — Plan of Care (Signed)
Patient going home under hospice care with continuous IV dobutamine infusion via right upper arm PICC.

## 2020-03-16 NOTE — Progress Notes (Signed)
[facility])  Made aware by [facility] and Dr. Hilma Favors that patient and family decided to go with [facility] due to their ability to support Inotrope. We will close this referral.  Thanks for the opportunity to support and care for this patient.   Clementeen Hoof, BSN, [facility] (in [place]) [phone]

## 2020-03-16 NOTE — Progress Notes (Signed)
[facility] Progress Note Patient Name: [name] DOB: 03/16/1936 MRN: [id number]   Date of Service  03/16/2020  HPI/Events of Note  Anemia - Hgb = 6.2.   eICU Interventions  Transfuse 1 unit PRBC now.      Intervention Category Major Interventions: Other:  Lysle Dingwall 03/16/2020, 5:51 AM

## 2020-03-16 NOTE — Progress Notes (Signed)
Palliative Medicine RN Note: Rec'd request from Dr Hilma Favors to help with Prior Auth for [facility]. Rec'd approval. Letter copied/pasted below.  Marjie Skiff Henley Blyth, RN, BSN, [facility] Palliative Medicine Team 03/16/2020 11:45 AM Office [phone]   [facility] Clinical Call Center Fax Transmittal Phone: [phone] Fax: [phone] Date: 03/16/2020 To: Dr. Hilma Favors Fax #: [phone] From: [place] Re: [name], 19-Oct-1935,  Message: The Prior Authorization request has been approved for [name]. The authorization is valid from 02/15/2020 through 03/16/2021. A letter of explanation will also be mailed to the patient. This PA determination only applies to medications dispensed by a pharmacy and billed to the [facility] pharmacy benefit. Now you can get responses to drug PAs immediately and securely online - without faxes, phone calls, or waiting. How? With electronic prior authorization (ePA)! For more information and to register, go to [email or web address]

## 2020-03-16 NOTE — Progress Notes (Signed)
NAME:  [name], MRN:  [id number], DOB:  1935/11/27, LOS: 25 ADMISSION DATE:  03/02/2020, CONSULTATION DATE:  03/03/2020 REFERRING MD:  Dr. Kristopher Oppenheim, CHIEF COMPLAINT:  Cardiorenal syndrome   History of present illness   84 year old male presents to ED on 11/30 with progressive shortness of breath and lower extremity edema with CHF and Severe MR with progressive renal failure. Has been followed by Heart Failure and Evaluated by CTS. Determined not a candidate for MitraClip. High risk for conventional surgery. Recommended aggressive medical management.   On arrival to ED patient is massively volume overloaded with poor response to outpatient diuretics. Crt 3.3. BUN 116. INR 2.8. Nephrology and Heart Failure Consulted. Attempted High dose diuretics overnight. Complicated also by hypotension. 12/1 decision made to transfer to ICU for CRRT.   Goals of care conversation with patient. States he is a DNR/DNI however wants to try dialysis, if it does not work would be amendable to comfort.   Past Medical History  Chronic Combined CHF, Severe Mitral Regurgitation, H/O mechanical aortic valve replacement, A.Fib, CAD s/p CABG, CKD stage IV (baseline Crt 2.5-3), ITP anemia  Significant Hospital Events   11/30 > Admit to Hospital  12/1 > Transfer to ICU for CRRT   Consults:  Heart Failure Nephrology  PCCM  Procedures:  Right IJ Vascath 12/1 >>  Significant Diagnostic Tests:  CXR 12/1 > Cardiac shadow is enlarged but stable. Postsurgical changes are again seen and stable. Aortic calcifications are noted. Mild vascular congestion is noted with mild interstitial edema. No focal infiltrate is seen. No bony abnormality is noted.  Micro Data:  Urine Culture 12/1 > Klebsiella pneumoniae  Antimicrobials:  Cefepime 12/1 - 12/3 Cephalexin 12/3 -12/7  Interim history/subjective:   Remains critically ill in the ICU. On vasopressors. Trialed off pressors but became symptomatic. Started dobutamine  which has helped symptoms. Palliative working on plan for hospice with home inotrope. Says he feels fine.  Objective   Blood pressure (!) 101/48, pulse 64, temperature (!) 97.5 F (36.4 C), temperature source Oral, resp. rate 14, height 6\' 2"  (1.88 m), weight 118.4 kg, SpO2 99 %.        Intake/Output Summary (Last 24 hours) at 03/16/2020 0952 Last data filed at 03/16/2020 0538 Gross per 24 hour  Intake 944.41 ml  Output 375 ml  Net 569.41 ml   Filed Weights   03/14/20 0334 03/15/20 0221 03/16/20 0500  Weight: 111.6 kg 112.4 kg 118.4 kg    Examination: General:elderly male, sitting up in bed, no distress HEENT: R IJ HD cath  Neuro: alert oriented, following commands  CV: RRR, s1 s2  PULM:NWOB, on [place] GI: obese soft, nt nd    Resolved Hospital Problem list     Assessment & Plan:   Acute on Chronic Heart Failure with severe volume overload and RV Failure with hypotension  Severe MR H/O AVR CAD s/p CABG Cardiogenic shock -Not a candidate for Mitral Clip, not a surgical candidate per CTS. Evaluated at [facility] for percutaneous mitral valve trial however denied  Plan Palliative dobutamine, mid line vs PICC for home use Continue midodrine   Low hemoglobin No sign of bleeding  - continue to observe - possible effect of CVVHD - last transfusion 03/10/20  Stage IV CKD (baseline Crt 2.5-3.4) Cardiorenal Syndrome  Plan  - CVVHD to stopped 12/11 - Lokelma BID  UTI - Klebsiella pneumoniae Plan Cefazolin x 7 days, completed 12/7  A.Fib on Coumadin Mechanical aortic valve Plan Continue coumadin per  cardiology Holding BB(-)   COPD OSA on CPAP at [facility]  Plan On room air   DM  Plan SSI   ITP Plan Follow CBC   Best practice (evaluated daily)   Diet: Low Salt Diet  DVT prophylaxis: Coumadin as above GI prophylaxis: N/A Glucose control: SSI Mobility: Bedrest  last date of multidisciplinary goals of care discussion: [place] 03/06/2020 documented  Family and staff  present: Patient palliative care team Summary of discussion: DNR Follow up goals of care discussion due: Plan to discharge home on 12/14 under hospice care Code Status: DNR Disposition: ICU  Labs    CBC: Recent Labs  Lab 03/12/20 0919 03/13/20 0244 03/14/20 0331 03/15/20 0230 03/16/20 0508  WBC 5.7 4.2 5.8 7.3 5.0  HGB 7.8* 7.7* 7.1* 7.2* 6.2*  HCT 24.4* 25.4* 24.3* 22.9* 19.5*  MCV 103.8* 106.7* 110.5* 108.5* 107.1*  PLT 65* 61* 77* 81* 78*    Basic Metabolic Panel: Recent Labs  Lab 03/13/20 0244 03/13/20 1618 03/14/20 0331 03/14/20 1550 03/14/20 2004 03/15/20 0230 03/15/20 1212 03/16/20 0508 03/16/20 0509  NA 134* 132* 127* 125* 126* 125* 124*  --  123*  K 5.0 5.2* 5.5* 5.0 5.2* 5.6* 5.4*  --  5.8*  CL 99 100 94* 94* 94* 92* 93*  --  92*  CO2 28 27 24 23 23 23 23   --  23  GLUCOSE 125* 110* 112* 140* 125* 119* 133*  --  96  BUN 19 26* 36* 45* 48* 51* 57*  --  66*  CREATININE 1.05 1.49* 1.78* 1.95* 2.19* 2.26* 2.50*  --  2.67*  CALCIUM 8.3* 8.1* 8.3* 7.8* 8.1* 8.1* 7.6*  --  8.0*  MG 2.6*  --  2.8*  --  2.8* 2.9*  --  2.7*  --   PHOS 2.8 3.1 3.3 4.1  --  4.8*  --   --  5.4*   GFR: Estimated Creatinine Clearance: 29.2 mL/min (A) (by C-G formula based on SCr of 2.67 mg/dL (H)). Recent Labs  Lab 03/13/20 0244 03/14/20 0331 03/15/20 0230 03/16/20 0508  WBC 4.2 5.8 7.3 5.0    Liver Function Tests: Recent Labs  Lab 03/13/20 1618 03/14/20 0331 03/14/20 1550 03/15/20 0230 03/16/20 0509  ALBUMIN 2.6* 2.7* 2.6* 2.6* 2.4*   No results for input(s): LIPASE, AMYLASE in the last 168 hours. No results for input(s): AMMONIA in the last 168 hours.  ABG    Component Value Date/Time   PHART 7.321 (L) 12/30/2008 1303   PCO2ART 48.5 (H) 12/30/2008 1303   PO2ART 296.0 (H) 12/30/2008 1303   HCO3 27.0 04/30/2018 1212   TCO2 28 04/30/2018 1212   ACIDBASEDEF 1.0 12/30/2008 1303   O2SAT 76.0 04/30/2018 1212     Coagulation Profile: Recent Labs  Lab  03/12/20 0319 03/13/20 0244 03/14/20 0331 03/15/20 0230 03/16/20 0533  INR 2.6* 2.3* 2.6* 2.6* 2.8*    Cardiac Enzymes: No results for input(s): CKTOTAL, CKMB, CKMBINDEX, TROPONINI in the last 168 hours.  HbA1C: Hgb A1c MFr Bld  Date/Time Value Ref Range Status  03/25/2019 09:53 AM 5.6 4.6 - 6.5 % Final    Comment:    Glycemic Control Guidelines for People with Diabetes:Non Diabetic:  <6%Goal of Therapy: <7%Additional Action Suggested:  >8%   07/01/2018 09:28 AM 5.7 4.6 - 6.5 % Final    Comment:    Glycemic Control Guidelines for People with Diabetes:Non Diabetic:  <6%Goal of Therapy: <7%Additional Action Suggested:  >8%     CBG: Recent Labs  Lab  03/15/20 0839 03/15/20 1135 03/15/20 1633 03/15/20 2250 03/16/20 0838  GLUCAP 114* 123* 121* 124* 126*    CRITICAL CARE Performed by: Bonna Gains Monque Haggar   Total critical care time: 32 minutes  Critical care time was exclusive of separately billable procedures and treating other patients.  Critical care was necessary to treat or prevent imminent or life-threatening deterioration.  Critical care was time spent personally by me on the following activities: development of treatment plan with patient and/or surrogate as well as nursing, discussions with consultants, evaluation of patient's response to treatment, examination of patient, obtaining history from patient or surrogate, ordering and performing treatments and interventions, ordering and review of laboratory studies, ordering and review of radiographic studies, pulse oximetry and re-evaluation of patient's condition.

## 2020-03-16 NOTE — Progress Notes (Signed)
Pt Alert and oriented in bed transferred to stretcher with EMS. Belongings give to transport. Report given to transport and 2200 meds given to patient before leaving floor. Patient stable and transported off floor.

## 2020-03-16 NOTE — Progress Notes (Signed)
CRITICAL VALUE ALERT  Critical Value:  Hgb 6.2  Date & Time Notied:  03/16/20 0541  Provider Notified: Emmit Alexanders, MD  Orders Received/Actions taken: Transfuse 1 UPRBCs

## 2020-03-16 NOTE — Discharge Summary (Addendum)
Physician Discharge Summary   Patient ID: [name] [id number] 84 y.o. 12-Jan-1936  Admit date: 03/02/2020  Discharge date and time: No discharge date for patient encounter.   Admitting Physician: Rise Patience, MD   Discharge Physician: Bonna Gains Tykiera Raven   Admission Diagnoses: Hepatorenal syndrome ([place]) [K76.7] Anasarca [R60.1] ESRD (end stage renal disease) ([place]) [N18.6] AKI (acute kidney injury) ([place]) [N17.9] Hemoptysis [R04.2] Acute on chronic congestive heart failure, unspecified heart failure type ([place]) [I50.9] Cardiogenic shock  Discharge Diagnoses: same as above  Admission Condition: serious  Discharged Condition: serious  Indication for Admission: volume overload, renal failure  Hospital Course:   Patient admitted with worsening volume overload in setting of know heart failure and worsened renal failure concerning for acute exacerbation of CHF and cardiorenal syndrome. Patient had low Bps concerning for cardiogenic shock. After GOC, agreed for trial of CRRT to see if fluid removal would improve him and hemodynamics. After a few days, decision was made to stop CRRT as he continued to need pressors. Clearly stated goal to go home. Could not achieve on CRRT and pressors. Pressor requirement in proved but persisted. Switched to dobutamine and symptoms improved. Discharging to home hospice on inotrope with assistance of palliative medicine.   Consults: cardiology, pulmonary/intensive care and palliative care, nephrology  Significant Diagnostic Studies: as per EMR  Treatments: dialysis, inotrope  Discharge Exam: General: Pleasant, in NAD Pulm: NWOB, on 2L [place] CV: tachy, warm Ext: Edema present, easy bruising  Disposition: Discharge disposition: 50-Hospice/Home       Allergies as of 03/16/2020      Reactions   Rifampin Rash, Other (See Comments)   May have been caused by Vancomycin or Rifampin (??)   Vancomycin Rash, Other (See Comments)   May  have been caused by Vancomycin or Rifampin (??)      Medication List    STOP taking these medications   carvedilol 3.125 MG tablet Commonly known as: COREG   cholecalciferol 25 MCG (1000 UNIT) tablet Commonly known as: VITAMIN D3   ezetimibe-simvastatin 10-20 MG tablet Commonly known as: VYTORIN   finasteride 5 MG tablet Commonly known as: PROSCAR   folic acid 174 MCG tablet Commonly known as: FOLVITE   polyethylene glycol powder 17 GM/SCOOP powder Commonly known as: GLYCOLAX/MIRALAX   tamsulosin 0.4 MG Caps capsule Commonly known as: FLOMAX   vitamin A & D ointment   vitamin B-12 1000 MCG tablet Commonly known as: CYANOCOBALAMIN     TAKE these medications   acetaminophen 500 MG tablet Commonly known as: TYLENOL Take 1,000 mg by mouth every 6 (six) hours as needed for moderate pain or headache. Notes to patient: Take as needed for pain   albuterol 108 (90 Base) MCG/ACT inhaler Commonly known as: VENTOLIN HFA INHALE 2 PUFFS INTO THE LUNGS EVERY 4 (FOUR) HOURS AS NEEDED FOR WHEEZING OR SHORTNESS OF BREATH.   allopurinol 100 MG tablet Commonly known as: ZYLOPRIM TAKE 1 TABLET BY MOUTH EVERY DAY What changed:   how much to take  how to take this  when to take this  additional instructions   amiodarone 200 MG tablet Commonly known as: PACERONE TAKE 1 TABLET BY MOUTH EVERY DAY   DOBUTamine 4-5 MG/ML-% infusion Commonly known as: DOBUTREX Infuse through PICC continuously at 91mcg/kg/min   haloperidol 2 MG/ML solution Commonly known as: HALDOL Take 1 mL (2 mg total) by mouth every 6 (six) hours as needed for agitation (Nausea, Sedation). Notes to patient: THIS IS A COMFORT CARE MEDICATION  levothyroxine 50 MCG tablet Commonly known as: SYNTHROID Take 2 tablets (100 mcg total) by mouth daily before breakfast. What changed: how much to take   lidocaine 5 % Commonly known as: LIDODERM Place 2 patches onto the skin daily. Remove & Discard patch within  12 hours or as directed by MD   liothyronine 25 MCG tablet Commonly known as: CYTOMEL Take 1 tablet (25 mcg total) by mouth daily. Start taking on: March 17, 2020   Lokelma 10 g Pack packet Generic drug: sodium zirconium cyclosilicate Take 10 g (1 packet total) by mouth 2 (two) times daily. 1 time dose What changed: when to take this   metolazone 5 MG tablet Commonly known as: ZAROXOLYN Take 1 tablet (5 mg total) by mouth 3 (three) times a week. Tuesdaym, Thursday and Saturday   midodrine 10 MG tablet Commonly known as: PROAMATINE Take 2 tablets (20 mg total) by mouth 3 (three) times daily with meals.   morphine CONCENTRATE 10 mg / 0.5 ml concentrated solution Place 1 mL (20 mg total) under the tongue every 2 (two) hours as needed for severe pain or shortness of breath.   ondansetron 8 MG disintegrating tablet Commonly known as: Zofran ODT Take 1 tablet (8 mg total) by mouth every 8 (eight) hours as needed for nausea or vomiting.   senna-docusate 8.6-50 MG tablet Commonly known as: Senokot-S Take 2 tablets by mouth at bedtime.   sodium chloride 0.65 % Soln nasal spray Commonly known as: OCEAN Place 2 sprays into the nose as needed for congestion.   torsemide 100 MG tablet Commonly known as: DEMADEX Take 1 tablet (100 mg total) by mouth 2 (two) times daily.   triamcinolone ointment 0.1 % Commonly known as: KENALOG Apply 1 application topically 2 (two) times daily. What changed:   when to take this  reasons to take this   warfarin 5 MG tablet Commonly known as: COUMADIN Take as directed. If you are unsure how to take this medication, talk to your nurse or doctor. Original instructions: TAKE  AS DIRECTED BY COUMADIN CLINIC What changed:   how much to take  how to take this  when to take this  additional instructions           Lanier Clam 03/16/2020 5:37 PM

## 2020-03-16 NOTE — Progress Notes (Signed)
ANTICOAGULATION CONSULT NOTE   Pharmacy Consult for Warfarin Indication:  Mechanical AVR  Allergies  Allergen Reactions  . Rifampin Rash and Other (See Comments)    May have been caused by Vancomycin or Rifampin (??)  . Vancomycin Rash and Other (See Comments)    May have been caused by Vancomycin or Rifampin (??)    Patient Measurements: Height: 6\' 2"  (188 cm) Weight: 118.4 kg (261 lb 0.4 oz) IBW/kg (Calculated) : 82.2 Heparin Dosing Weight:   Vital Signs: Temp: 97.5 F (36.4 C) (12/14 0359) Temp Source: Oral (12/14 0359) BP: 101/48 (12/14 0600) Pulse Rate: 64 (12/14 0600)  Labs: Recent Labs    03/14/20 0331 03/14/20 1550 03/15/20 0230 03/15/20 1212 03/16/20 0508 03/16/20 0509 03/16/20 0533  HGB 7.1*  --  7.2*  --  6.2*  --   --   HCT 24.3*  --  22.9*  --  19.5*  --   --   PLT 77*  --  81*  --  78*  --   --   LABPROT 27.0*  --  27.3*  --   --   --  28.6*  INR 2.6*  --  2.6*  --   --   --  2.8*  CREATININE 1.78*   < > 2.26* 2.50*  --  2.67*  --    < > = values in this interval not displayed.    Estimated Creatinine Clearance: 29.2 mL/min (A) (by C-G formula based on SCr of 2.67 mg/dL (H)).  Assessment: 17 yom that is being admitted for SOB after fluid overload in the setting of CKD. Patient is on warfarin at home for a mechanical AVR. Prior to admission  regimen is Warfarin 5mg  Sat and Tues and 2.5mg  all other days.   Today, INR 2.6   Goal of Therapy:  INR 2.5-3.5 Monitor platelets by anticoagulation protocol: Yes   Plan:  Warfarin 2.5 mg po x 1 tonight.  Monitor closely for bleeding. Continue to follow daily INR and plans.   Barth Kirks, PharmD, BCPS, BCCCP Clinical Pharmacist [phone]  Please check AMION for all [place] numbers  03/16/2020 7:50 AM

## 2020-03-16 NOTE — TOC Transition Note (Addendum)
Transition of Care [facility]) - CM/SW Discharge Note   Patient Details  Name: Jean Skow MRN: [id number] Date of Birth: Jan 18, 1936  Transition of Care [facility]) CM/SW Contact:  Carles Collet, RN Phone Number: 03/16/2020, 10:19 AM   Clinical Narrative:   Spoke w Dr Hilma Favors of [facility] who requested home hospice today with ability to support inotrope. [facility] verified that they can support IV inotrope with RN start of care today, in as soon as 2 hours.  MD verified that all DME has been delivered to the home through [facility]. Per both Tim w [facility] and [place] agree that current DME can stay in the home and be rebilled by [facility], thus not disrupting continuity of care. Pam w [facility] home infusions notified of IV medications for home, she will reach out to Dr Hilma Favors for needed orders and hook up patient before he goes home.  Per MD, patient getting midline placed this morning, them transfused prior to DC.  Patient will need transport via [facility] to support inotrope.  Spoke w wife and confirmed all DME delivered including O2. Verified address.  Bedside nurse to notify me w estimated time of completion for transfusion. Papers for [facility] and DNR on chart.   Pam to hook up to dobutamine around 5, and [facility] to arrive around [place] w [facility] notified of DC time     Final next level of care: Home w Hospice Care Barriers to Discharge: No Barriers Identified   Patient Goals and CMS Choice Patient states their goals for this hospitalization and ongoing recovery are:: to go home CMS Medicare.gov Compare Post Acute Care list provided to:: Patient Represenative (must comment) Choice offered to / list presented to : Spouse  Discharge Placement                       Discharge Plan and Services In-house Referral: Clinical Social Work Discharge Planning Services: CM Consult Post Acute Care Choice: Hospice                      HH Agency:   ([place]) Date Energy: 03/16/20 Time HH Agency Contacted: 49 Representative spoke with at [place]: [place], [place] for IV inotrope,  Social Determinants of Health (SDOH) Interventions     Readmission Risk Interventions No flowsheet data found.

## 2020-03-16 NOTE — Progress Notes (Signed)
Peripherally Inserted Central Catheter Placement  The IV Nurse has discussed with the patient and/or persons authorized to consent for the patient, the purpose of this procedure and the potential benefits and risks involved with this procedure.  The benefits include less needle sticks, lab draws from the catheter, and the patient may be discharged home with the catheter. Risks include, but not limited to, infection, bleeding, blood clot (thrombus formation), and puncture of an artery; nerve damage and irregular heartbeat and possibility to perform a PICC exchange if needed/ordered by physician.  Alternatives to this procedure were also discussed.  [facility] Power PICC patient education guide, fact sheet on infection prevention and patient information card has been provided to patient /or left at bedside.    PICC Placement Documentation  PICC Double Lumen 03/16/20 PICC Right Brachial 45 cm 0 cm (Active)  Indication for Insertion or Continuance of Line Home intravenous therapies (PICC only) 03/16/20 1044  Exposed Catheter (cm) 0 cm 03/16/20 1044  Site Assessment Clean;Dry;Intact 03/16/20 1044  Lumen #1 Status Flushed;Saline locked;Blood return noted 03/16/20 1044  Lumen #2 Status Flushed;Saline locked;Blood return noted 03/16/20 1044  Dressing Type Transparent;Securing device 03/16/20 1044  Dressing Status Clean;Dry;Intact 03/16/20 1044  Antimicrobial disc in place? Yes 03/16/20 1044  Safety Lock Not Applicable 19/16/60 6004  Dressing Intervention New dressing 03/16/20 5997  Dressing Change Due 03/23/20 03/16/20 1044       [name] 03/16/2020, 10:47 AM

## 2020-03-17 LAB — TYPE AND SCREEN
ABO/RH(D): O NEG
Antibody Screen: NEGATIVE
Unit division: 0

## 2020-03-17 LAB — BPAM RBC
Blood Product Expiration Date: 202112212359
ISSUE DATE / TIME: 202112141259
Unit Type and Rh: 9500

## 2020-03-18 ENCOUNTER — Encounter (HOSPITAL_COMMUNITY): Payer: Medicare Other | Admitting: Cardiology

## 2020-03-25 ENCOUNTER — Telehealth: Payer: Self-pay | Admitting: *Deleted

## 2020-03-25 NOTE — Telephone Encounter (Signed)
[facility] since warfarin is still on medlist per hospital discharge. Spoke with Fransisco Beau, Case Manager, that was able to confirm that the pt is not on Warfarin at this time and states he did not come home with it. She states he is on the dobutamine drip and it is being titrated for blood pressure along with other comfort meds. Will discontinue Warfarin encounter at this time.

## 2020-04-03 DEATH — deceased

## 2020-04-07 ENCOUNTER — Other Ambulatory Visit: Payer: Self-pay | Admitting: Internal Medicine

## 2020-04-07 NOTE — Telephone Encounter (Signed)
This Rx refill request came to the [facility] ([facility]  [facility] does not do refills for this provider.

## 2020-04-12 ENCOUNTER — Other Ambulatory Visit: Payer: Self-pay | Admitting: Internal Medicine

## 2020-04-12 NOTE — Telephone Encounter (Signed)
See request for refill for midodrine. [facility] not filling Dr. Mamie Nick Rx's Requested Prescriptions  Pending Prescriptions Disp Refills   midodrine (PROAMATINE) 10 MG tablet [Pharmacy Med Name: MIDODRINE HCL 10 MG TABLET] 540 tablet 1    Sig: TAKE 2 TABLETS (20 MG TOTAL) BY MOUTH 3 (THREE) TIMES DAILY WITH MEALS.      There is no refill protocol information for this order

## 2020-04-12 NOTE — Telephone Encounter (Signed)
Sending to [facility]

## 2020-04-12 NOTE — Telephone Encounter (Signed)
Per chart med is rx by Acquanetta Chain, DO, and was her filled 04/09/20.

## 2020-05-17 NOTE — Progress Notes (Incomplete)
[name]   Hematology Clinic Note  Date of service: 08/01/17   Patient Care Team: Cassandria Anger, MD as PCP - General (Internal Medicine) Belva Crome, MD as PCP - Cardiology (Cardiology) Sueanne Margarita, MD as PCP - Sleep Medicine (Sleep Medicine) Brunetta Genera, MD as Consulting Physician (Hematology) Belva Crome, MD as Consulting Physician (Cardiology) Elayne Snare, MD as Consulting Physician (Endocrinology) Lavonna Monarch, MD as Consulting Physician (Dermatology)  CHIEF COMPLAINTS: follow-up for Macrocytosis and Thrombocytopenia.  HISTORY OF PRESENTING ILLNESS: Please see my initial note for details on initial presentation.  Interval history:  [name] is here for follow-up regarding his thrombocytopenia. We are joined today by his wife. The patient's last visit with [place] was on 11/12/2019. The pt reports that he is doing well overall.  The pt reports ***  Lab results today 05/18/2020 of CBC w/diff and CMP is as follows: all values are WNL except for ***  On review of systems, pt reports *** and denies *** and any other symptoms.  MEDICAL HISTORY:  Past Medical History:  Diagnosis Date  . Carotid artery disease ([place]) 1994   s/p left carotid endarerectomy   . Chronic atrial fibrillation (HCC)    a. on coumadin   . Chronic diastolic CHF (congestive heart failure) ([place])   . Diabetes mellitus without complication ([place])    dx 2016  . Dyspnea   . Epistaxis   . Heart murmur   . Hx of CABG    a. 1994  . Hypertension   . OSA (obstructive sleep apnea)   . Rheumatic fever   . S/P AVR (aortic valve replacement)    a. mechanical valve 1996  . Subclavian bypass stenosis ([place])   . 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 Urinary catheter in place 12/2019  . Vitamin B12 deficiency     SURGICAL HISTORY: Past Surgical History:  Procedure Laterality Date  . artificial valve    .  [place]   replaced due to aortic stenosis, [employer] mechanical prostesis  . CAROTID ENDARTERECTOMY Left 1997   subclavian bypass Done in [place]  . CORONARY ARTERY BYPASS GRAFT  1994   w SVG to RCA and SVG to circumflex  . heart bypass     Done in [place]  . IR FLUORO GUIDE CV LINE RIGHT  03/03/2020  . IR US GUIDE VASC ACCESS RIGHT  03/03/2020  . MULTIPLE EXTRACTIONS WITH ALVEOLOPLASTY Bilateral 12/12/2017   Procedure: Extraction of tooth #'s 1, 12,13,14,15,17,18,19, 29, and 30 with alveoloplasty and gross debridement of remaining teeth`;  Surgeon: Lenn Cal, DDS;  Location: [facility];  Service: Oral Surgery;  Laterality: Bilateral;  MULTIPLE EXTRACTION WITH ALVEOLOPLASTY WITH PRE PROSTHETIC SURGERY AND GROSS DEBRIDEMENT OF REMAINING TEETH  . RIGHT HEART CATH AND CORONARY/GRAFT ANGIOGRAPHY N/A 04/30/2018   Procedure: RIGHT HEART CATH AND CORONARY/GRAFT ANGIOGRAPHY;  Surgeon: Larey Dresser, MD;  Location: [facility] CV LAB;  Service: Cardiovascular;  Laterality: N/A;  . TEE WITHOUT CARDIOVERSION N/A 10/23/2017   Procedure: TRANSESOPHAGEAL ECHOCARDIOGRAM (TEE);  Surgeon: Larey Dresser, MD;  Location: [facility] ENDOSCOPY;  Service: Cardiovascular;  Laterality: N/A;  . TONSILLECTOMY      SOCIAL HISTORY: Social History   Socioeconomic History  . Marital status: Married    Spouse name: Not on file  . Number of children: 3  . Years of education: Not on file  . Highest  education level: Not on file  Occupational History  . Not on file  Tobacco Use  . Smoking status: Former Smoker    Packs/day: 1.00    Years: 30.00    Pack years: 30.00    Types: Cigarettes    Quit date: 04/03/1992    Years since quitting: 28.1  . Smokeless tobacco: Never Used  Vaping Use  . Vaping Use: Never used  Substance and Sexual Activity  . Alcohol use: Yes    Comment: 4-6 ounces of wine daily  . Drug use: No    Comment: Half a cup a day.  Marland Kitchen Sexual activity: Not on file  Other Topics  Concern  . Not on file  Social History Narrative   Patient is married and lives with his wife.   Youngest daughter lives in [place] also.   Patient has 2 other children.   Social Determinants of Health   Financial Resource Strain: Not on file  Food Insecurity: Not on file  Transportation Needs: Not on file  Physical Activity: Not on file  Stress: Not on file  Social Connections: Not on file  Intimate Partner Violence: Not on file    FAMILY HISTORY: Family History  Problem Relation Age of Onset  . Cancer Father        lung   . Hypertension Mother     ALLERGIES:  is allergic to rifampin and vancomycin.  MEDICATIONS:  Current Outpatient Medications  Medication Sig Dispense Refill  . acetaminophen (TYLENOL) 500 MG tablet Take 1,000 mg by mouth every 6 (six) hours as needed for moderate pain or headache.    . albuterol (VENTOLIN HFA) 108 (90 Base) MCG/ACT inhaler INHALE 2 PUFFS INTO THE LUNGS EVERY 4 (FOUR) HOURS AS NEEDED FOR WHEEZING OR SHORTNESS OF BREATH. 18 each 5  . allopurinol (ZYLOPRIM) 100 MG tablet TAKE 1 TABLET BY MOUTH EVERY DAY (Patient taking differently: Take 100 mg by mouth daily. ) 90 tablet 3  . amiodarone (PACERONE) 200 MG tablet TAKE 1 TABLET BY MOUTH EVERY DAY (Patient taking differently: Take 200 mg by mouth daily. ) 90 tablet 3  . DOBUTamine (DOBUTREX) 4-5 MG/ML-% infusion Infuse through PICC continuously at 74mcg/kg/min 250 mL 3  . haloperidol (HALDOL) 2 MG/ML solution Take 1 mL (2 mg total) by mouth every 6 (six) hours as needed for agitation (Nausea, Sedation). 120 mL 0  . levothyroxine (SYNTHROID) 50 MCG tablet Take 2 tablets (100 mcg total) by mouth daily before breakfast. 90 tablet 3  . lidocaine (LIDODERM) 5 % Place 2 patches onto the skin daily. Remove & Discard patch within 12 hours or as directed by MD 30 patch 0  . liothyronine (CYTOMEL) 25 MCG tablet Take 1 tablet (25 mcg total) by mouth daily. 30 tablet 1  . LOKELMA 10 g PACK packet Take 10 g  (1 packet total) by mouth 2 (two) times daily. 1 time dose 60 each 2  . metolazone (ZAROXOLYN) 5 MG tablet Take 1 tablet (5 mg total) by mouth 3 (three) times a week. Tuesdaym, Thursday and Saturday 15 tablet 5  . midodrine (PROAMATINE) 10 MG tablet TAKE 2 TABLETS (20 MG TOTAL) BY MOUTH 3 (THREE) TIMES DAILY WITH MEALS. 90 tablet 1  . Morphine Sulfate (MORPHINE CONCENTRATE) 10 mg / 0.5 ml concentrated solution Place 1 mL (20 mg total) under the tongue every 2 (two) hours as needed for severe pain or shortness of breath. 30 mL 0  . ondansetron (ZOFRAN ODT) 8 MG disintegrating tablet Take  1 tablet (8 mg total) by mouth every 8 (eight) hours as needed for nausea or vomiting. 20 tablet 3  . senna-docusate (SENOKOT-S) 8.6-50 MG tablet Take 2 tablets by mouth at bedtime. 60 tablet 1  . sodium chloride (OCEAN) 0.65 % SOLN nasal spray Place 2 sprays into the nose as needed for congestion. 30 mL 3  . torsemide (DEMADEX) 100 MG tablet Take 1 tablet (100 mg total) by mouth 2 (two) times daily. 60 tablet 3  . triamcinolone ointment (KENALOG) 0.1 % Apply 1 application topically 2 (two) times daily. (Patient taking differently: Apply 1 application topically daily as needed (rash). ) 80 g 1  . warfarin (COUMADIN) 5 MG tablet TAKE  AS DIRECTED BY COUMADIN CLINIC (Patient taking differently: Take 5 mg by mouth See admin instructions. 1 tablet(5mg )on Saturday and Thursday  1/2 tablet(2.5mg ) Monday,tuesday,wednesday,friday,sunday) 100 tablet 1   No current facility-administered medications for this visit.    REVIEW OF SYSTEMS:   10 Point review of Systems was done is negative except as noted above.   PHYSICAL EXAMINATION: ECOG PERFORMANCE STATUS: 1 - Symptomatic but completely ambulatory  There were no vitals filed for this visit. There were no vitals filed for this visit.  *** GENERAL:alert, in no acute distress and comfortable SKIN: no acute rashes, no significant lesions EYES: conjunctiva are pink and  non-injected, sclera anicteric OROPHARYNX: MMM, no exudates, no oropharyngeal erythema or ulceration NECK: supple, no JVD LYMPH:  no palpable lymphadenopathy in the cervical, axillary or inguinal regions LUNGS: clear to auscultation b/l with normal respiratory effort HEART: regular rate & rhythm ABDOMEN:  normoactive bowel sounds , non tender, not distended. Extremity: no pedal edema PSYCH: alert & oriented x 3 with fluent speech NEURO: no focal motor/sensory deficits  LABORATORY DATA:   . CBC Latest Ref Rng & Units 03/16/2020 03/15/2020 03/14/2020  WBC 4.0 - 10.5 K/uL 5.0 7.3 5.8  Hemoglobin 13.0 - 17.0 g/dL 6.2(LL) 7.2(L) 7.1(L)  Hematocrit 39.0 - 52.0 % 19.5(L) 22.9(L) 24.3(L)  Platelets 150 - 400 K/uL 78(L) 81(L) 77(L)   . CBC    Component Value Date/Time   WBC 5.0 03/16/2020 0508   RBC 1.82 (L) 03/16/2020 0508   HGB 6.2 (LL) 03/16/2020 0508   HGB 9.3 (L) 08/01/2017 1136   HGB 11.6 (L) 03/30/2017 1147   HGB 12.0 (L) 01/19/2017 0827   HCT 19.5 (L) 03/16/2020 0508   HCT 31.1 (L) 08/20/2019 1105   HCT 37.8 (L) 01/19/2017 0827   PLT 78 (L) 03/16/2020 0508   PLT 56 (L) 08/01/2017 1136   PLT 59 (LL) 03/30/2017 1147   MCV 107.1 (H) 03/16/2020 0508   MCV 106 (H) 03/30/2017 1147   MCV 113.9 (H) 01/19/2017 0827   MCH 34.1 (H) 03/16/2020 0508   MCHC 31.8 03/16/2020 0508   RDW 22.5 (H) 03/16/2020 0508   RDW 14.5 03/30/2017 1147   RDW 16.2 (H) 01/19/2017 0827   LYMPHSABS 0.5 (L) 03/02/2020 1510   LYMPHSABS 0.8 (L) 01/19/2017 0827   MONOABS 0.4 03/02/2020 1510   MONOABS 0.3 01/19/2017 0827   EOSABS 0.0 03/02/2020 1510   EOSABS 0.1 01/19/2017 0827   BASOSABS 0.0 03/02/2020 1510   BASOSABS 0.0 01/19/2017 0827    . CMP Latest Ref Rng & Units 03/16/2020 03/15/2020 03/15/2020  Glucose 70 - 99 mg/dL 96 133(H) 119(H)  BUN 8 - 23 mg/dL 66(H) 57(H) 51(H)  Creatinine 0.61 - 1.24 mg/dL 2.67(H) 2.50(H) 2.26(H)  Sodium 135 - 145 mmol/L 123(L) 124(L) 125(L)  Potassium  3.5 - 5.1  mmol/L 5.8(H) 5.4(H) 5.6(H)  Chloride 98 - 111 mmol/L 92(L) 93(L) 92(L)  CO2 22 - 32 mmol/L 23 23 23   Calcium 8.9 - 10.3 mg/dL 8.0(L) 7.6(L) 8.1(L)  Total Protein 6.5 - 8.1 g/dL - - -  Total Bilirubin 0.3 - 1.2 mg/dL - - -  Alkaline Phos 38 - 126 U/L - - -  AST 15 - 41 U/L - - -  ALT 0 - 44 U/L - - -    RADIOGRAPHIC STUDIES: I have personally reviewed the radiological images as listed and agreed with the findings in the report. No results found.  ASSESSMENT & PLAN   1) Thrombocytopenia. Moderate. Platelet counts are stable and the same ballpark in 60-70k  range for the last >1 year, early 2018. 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 + cannot rule out ITP element. Could have possible element of MDS. He has some pelgeroid neutrophils on his peripheral blood smear that are suggestive of possible MDS. Hepatitis B, and C serologies negative. No evidence of a lymphoproliferative syndrome on peripheral blood smear or clinical examination. SPEP showed no monoclonal protein.  Plan - patient's platelets remain low but stable at 53k . -If platelet counts drop below 50K might need to limit the INR fluctuations on Coumadin from 2-3 more strictly. -continue absolute alcohol cessation -Avoid NSAIDS and other medications known to cause thrombocytopenia especially ranitidine and other  H2 blockers, sulfa drugs, PPIs. -continue vitamin B12 2000 g sublingually daily and Folic acid 1 mg by mouth daily to support accelerated hematopoeisis with valve hemolysis. -Coumadin monitoring as per cardiology clinic -if plts <50k (due to anticoagulation) or if bleeding issues might need to consider Nplate/Promacta.  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 Has somewhat elevated LDH confirms the presence of low-grade hemolysis. He appears to have peripheral blood smear  findings of possible MDS.  PLAN: -Discussed pt labwork today, 05/18/2020; ***   -Continue avoiding NSAIDs and any other medications that could drop PLT -Continue Vitamin B-complex and Folic acid daily  -Will see back in ***   FOLLOW UP: ***   The total time spent in the appointment was *** minutes and more than 50% was on counseling and direct patient cares.  All of the patient's questions were answered with apparent satisfaction. The patient knows to call the clinic with any problems, questions or concerns.  Andrew Lone MD [place] Hematology/Oncology Physician [facility]  (Office):       [phone] (Work cell):  [phone] (Fax):           [phone]  I, Reinaldo Raddle, am acting as scribe for Dr. Sullivan Lone, MD.

## 2020-05-18 ENCOUNTER — Inpatient Hospital Stay: Payer: Federal, State, Local not specified - PPO | Admitting: Hematology

## 2020-05-18 ENCOUNTER — Inpatient Hospital Stay: Payer: Federal, State, Local not specified - PPO | Attending: Internal Medicine

## 2020-05-19 ENCOUNTER — Telehealth: Payer: Self-pay | Admitting: *Deleted

## 2020-05-19 NOTE — Telephone Encounter (Signed)
Patient did not come to appts for lab and Dr. Irene Limbo on 05/18/20 Schedule message sent to contact patient to r/s at his convenience

## 2020-07-31 IMAGING — CT CT FEMUR *L* W/O CM
2 of 3 series · 13 of 33 positions shown, 16 images · non-contrast
Comparison: None.

CLINICAL DATA: Left thigh swelling with drop in hemoglobin.

EXAM:
CT OF THE LOWER LEFT EXTREMITY WITHOUT CONTRAST
TECHNIQUE: Multidetector CT imaging of the lower left extremity was performed
according to the standard protocol.

[Series 4: lfov ext 3.0 b40s · axial · 0.69mm/px · z∈[+505,+997]mm · 10 of 194 slices shown, 13 images]
[im 15/194  soft-tissue]
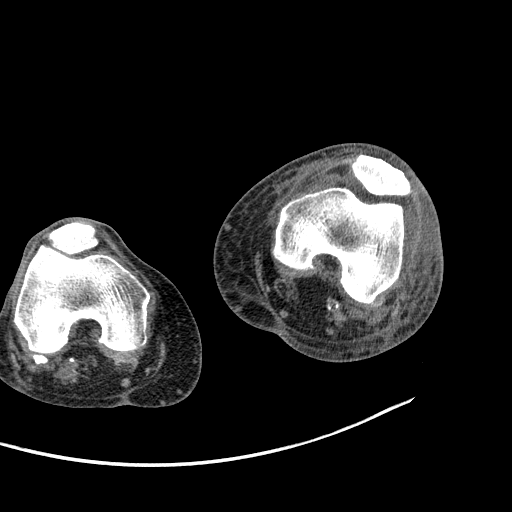
[im 15/194  bone]
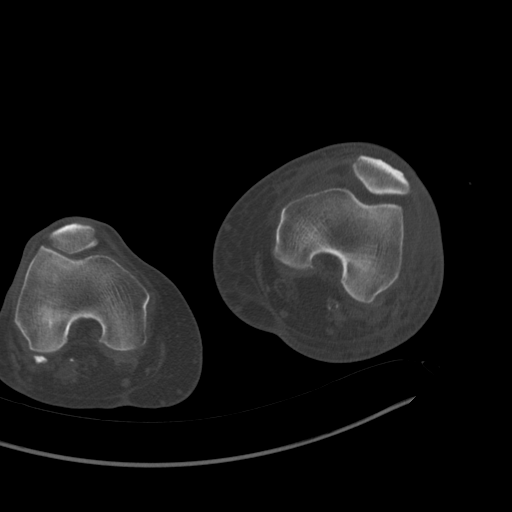
[im 30/194  bone]
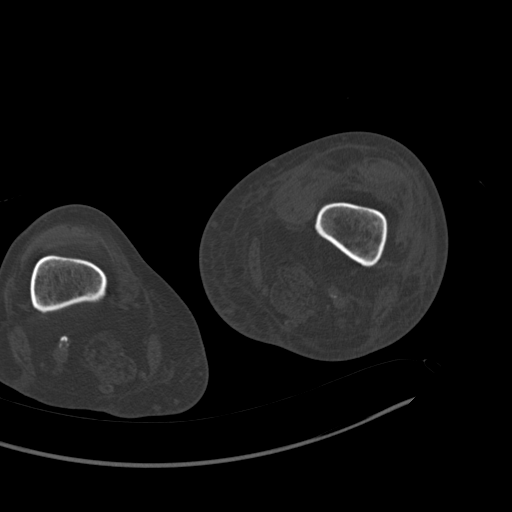
[im 60/194  bone]
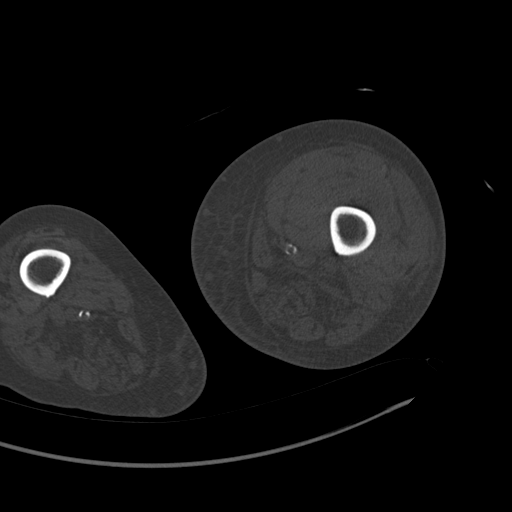
[im 75/194  bone]
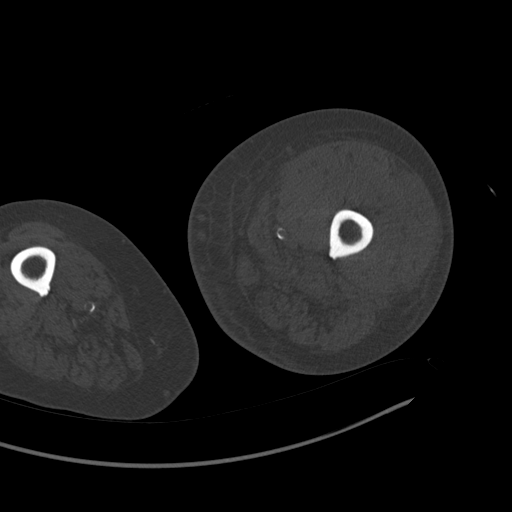
[im 90/194  soft-tissue]
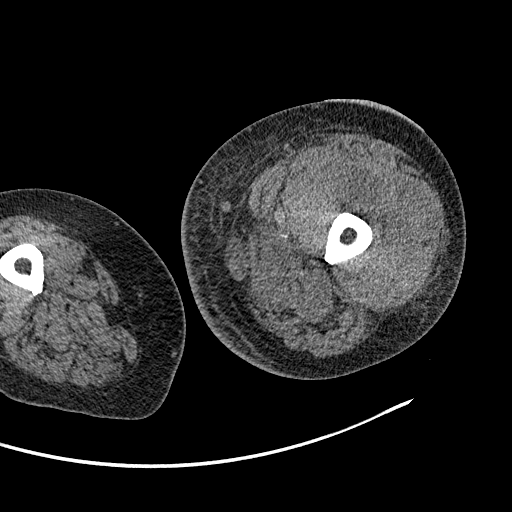
[im 90/194  bone]
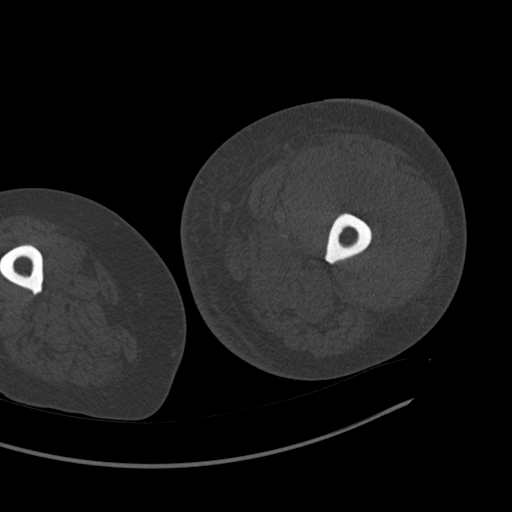
[im 104/194  bone]
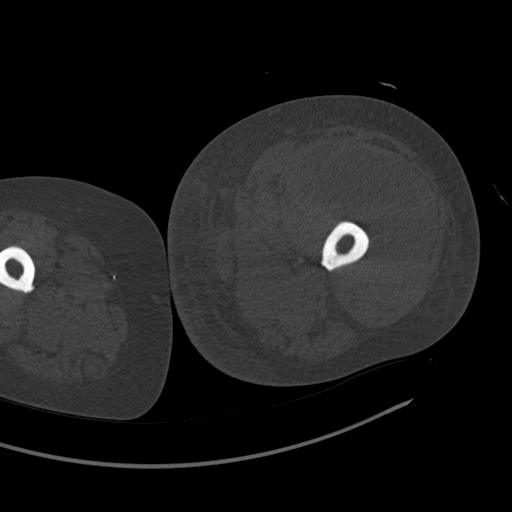
[im 119/194  bone]
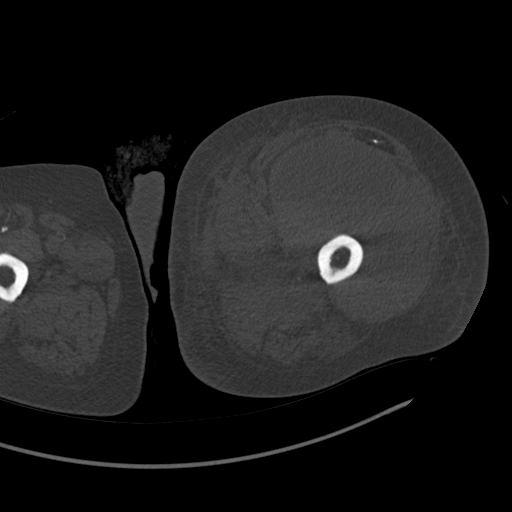
[im 149/194  bone]
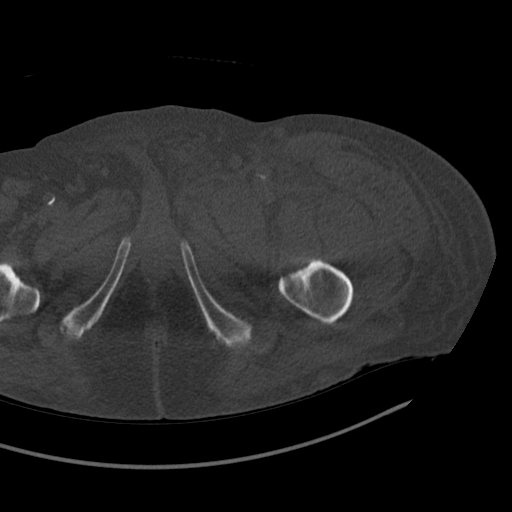
[im 164/194  soft-tissue]
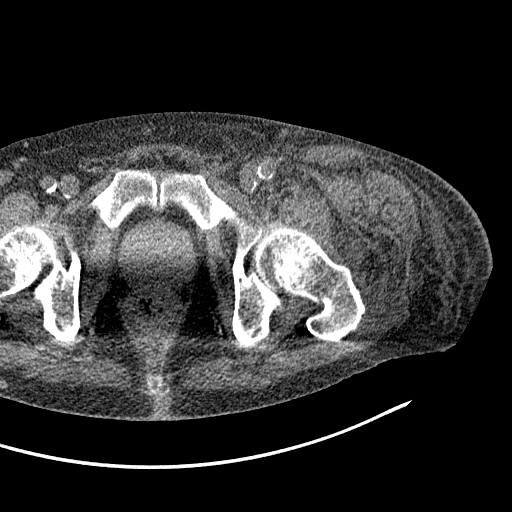
[im 164/194  bone]
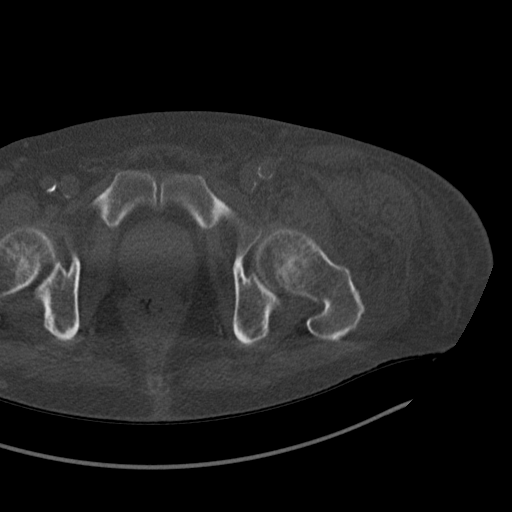
[im 179/194  bone]
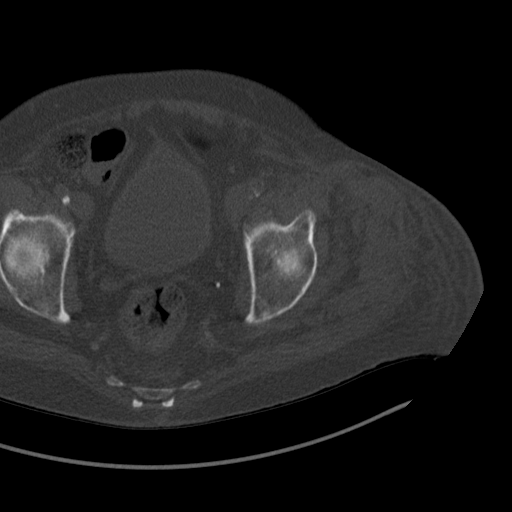

[Series 9: coronalsoft tissue · coronal · 0.49mm/px · 3 of 134 slices shown]
[im 27/134  bone]
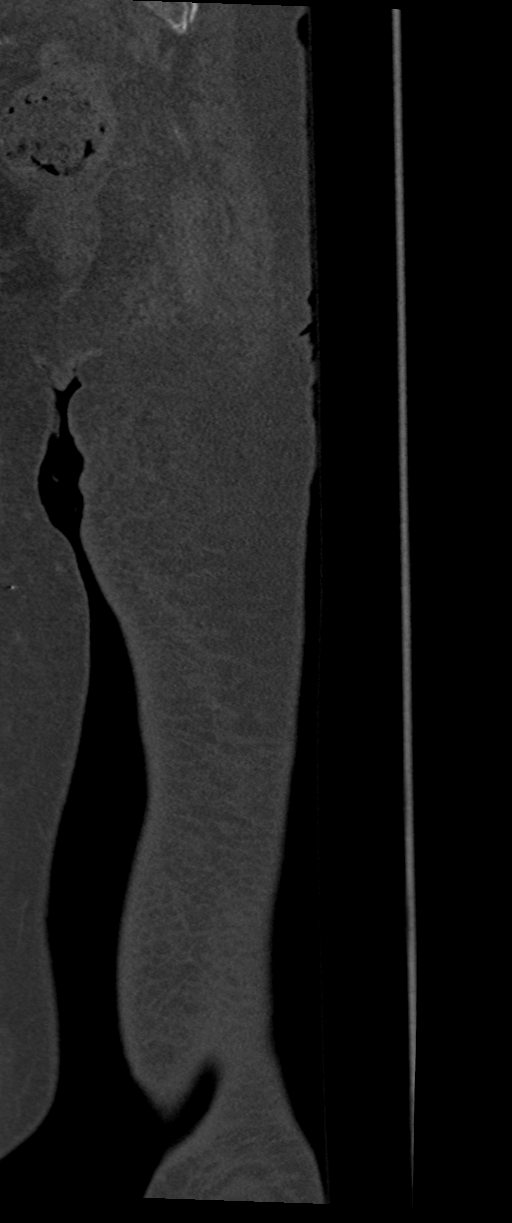
[im 54/134  bone]
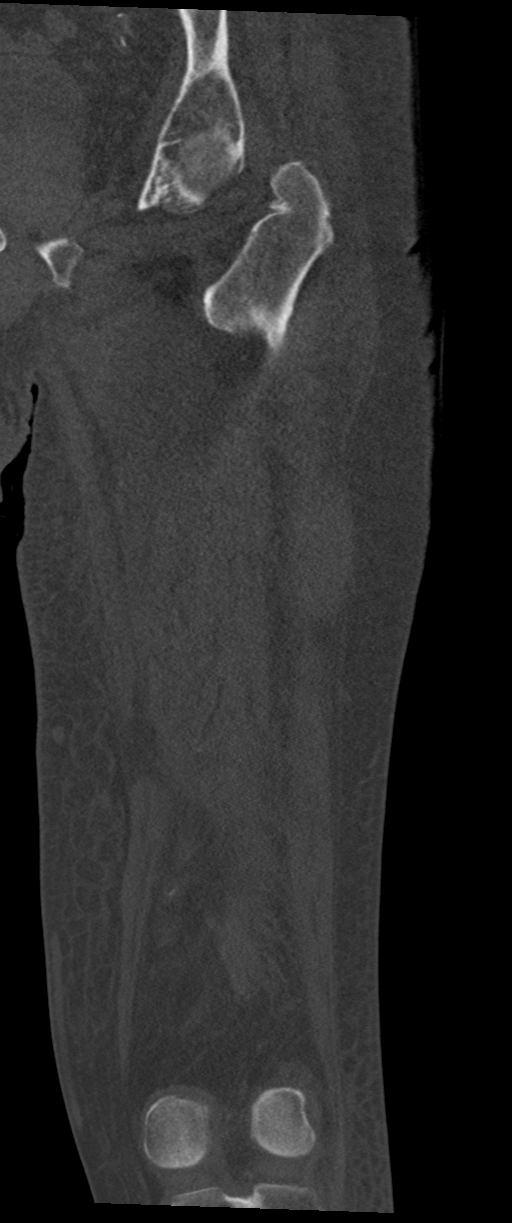
[im 80/134  bone]
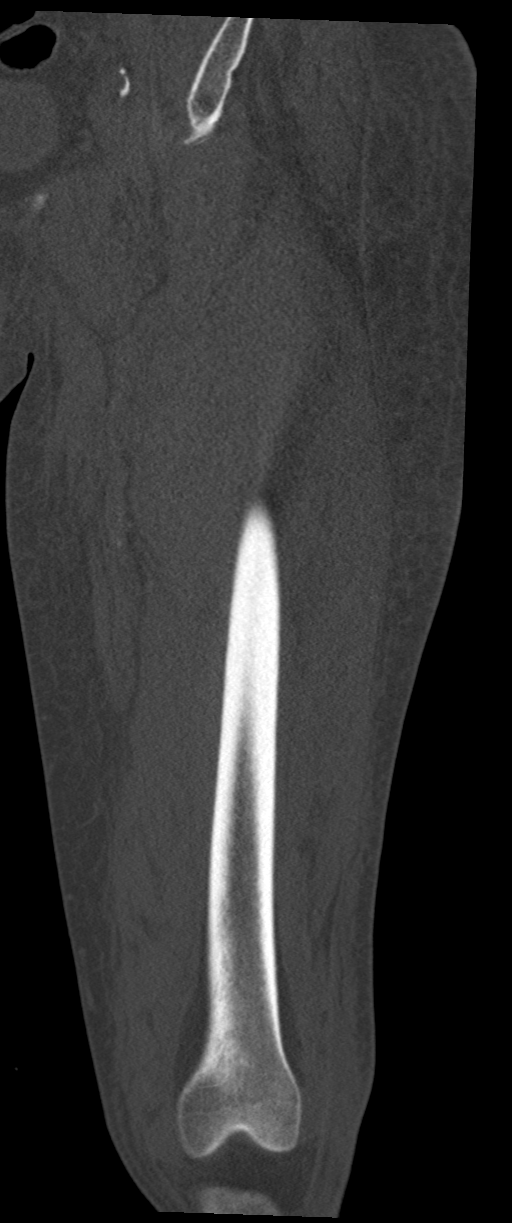

[13 of 33 positions shown; findings below may reference images not displayed]

FINDINGS: Bones/Joint/Cartilage

The included sacroiliac joints, pubic symphysis and hips are
maintained. No fracture of the left femur nor suspicious osseous
lesions. No hip or knee effusion.

Ligaments

Suboptimally assessed by CT.

Muscles and Tendons

Intramuscular hematoma centered within the left vastus lateralis
muscle measuring 22.2 x 10.8 x 6.6 cm is identified (craniocaudad by
transverse by AP) with hematocrit level noted. The remainder of the
anterior compartment muscles are unremarkable.

Soft tissues

Mild soft tissue induration of the left thigh.
IMPRESSION: Intramuscular hematoma of the left vastus lateralis muscle measuring
22 x 10.8 x 6.6 cm. No acute osseous abnormality.
